# Patient Record
Sex: Female | Born: 1955
Health system: Southern US, Community
[De-identification: ages and names within clinical notes are randomized; demographics above are authoritative.]

## PROBLEM LIST (undated history)

## (undated) DIAGNOSIS — M1990 Unspecified osteoarthritis, unspecified site: Secondary | ICD-10-CM

## (undated) DIAGNOSIS — Z720 Tobacco use: Secondary | ICD-10-CM

## (undated) DIAGNOSIS — E039 Hypothyroidism, unspecified: Secondary | ICD-10-CM

## (undated) DIAGNOSIS — G4733 Obstructive sleep apnea (adult) (pediatric): Secondary | ICD-10-CM

## (undated) DIAGNOSIS — F32A Depression, unspecified: Secondary | ICD-10-CM

## (undated) DIAGNOSIS — M1711 Unilateral primary osteoarthritis, right knee: Secondary | ICD-10-CM

## (undated) DIAGNOSIS — Z87442 Personal history of urinary calculi: Secondary | ICD-10-CM

## (undated) DIAGNOSIS — Z9989 Dependence on other enabling machines and devices: Secondary | ICD-10-CM

## (undated) DIAGNOSIS — I10 Essential (primary) hypertension: Secondary | ICD-10-CM

## (undated) DIAGNOSIS — K760 Fatty (change of) liver, not elsewhere classified: Secondary | ICD-10-CM

## (undated) DIAGNOSIS — E119 Type 2 diabetes mellitus without complications: Secondary | ICD-10-CM

## (undated) DIAGNOSIS — G629 Polyneuropathy, unspecified: Secondary | ICD-10-CM

## (undated) DIAGNOSIS — M1712 Unilateral primary osteoarthritis, left knee: Secondary | ICD-10-CM

## (undated) DIAGNOSIS — F419 Anxiety disorder, unspecified: Secondary | ICD-10-CM

## (undated) DIAGNOSIS — F329 Major depressive disorder, single episode, unspecified: Secondary | ICD-10-CM

## (undated) DIAGNOSIS — I2510 Atherosclerotic heart disease of native coronary artery without angina pectoris: Secondary | ICD-10-CM

## (undated) DIAGNOSIS — Z9289 Personal history of other medical treatment: Secondary | ICD-10-CM

## (undated) HISTORY — DX: Type 2 diabetes mellitus without complications: E11.9

## (undated) HISTORY — DX: Atherosclerotic heart disease of native coronary artery without angina pectoris: I25.10

## (undated) HISTORY — DX: Hypothyroidism, unspecified: E03.9

## (undated) HISTORY — PX: COLONOSCOPY: SHX174

## (undated) HISTORY — PX: CATARACT EXTRACTION: SUR2

## (undated) HISTORY — DX: Dependence on other enabling machines and devices: Z99.89

## (undated) HISTORY — DX: Essential (primary) hypertension: I10

## (undated) HISTORY — DX: Tobacco use: Z72.0

## (undated) HISTORY — DX: Obstructive sleep apnea (adult) (pediatric): G47.33

## (undated) HISTORY — PX: BREAST ENHANCEMENT SURGERY: SHX7

## (undated) HISTORY — PX: AUGMENTATION MAMMAPLASTY: SUR837

## (undated) HISTORY — DX: Personal history of other medical treatment: Z92.89

---

## 1988-02-22 HISTORY — PX: POSTERIOR CERVICAL FUSION/FORAMINOTOMY: SHX5038

## 1998-06-15 ENCOUNTER — Emergency Department (HOSPITAL_COMMUNITY): Admission: EM | Admit: 1998-06-15 | Discharge: 1998-06-15 | Payer: Self-pay | Admitting: Emergency Medicine

## 1998-06-15 ENCOUNTER — Encounter: Payer: Self-pay | Admitting: Emergency Medicine

## 2000-01-14 ENCOUNTER — Emergency Department (HOSPITAL_COMMUNITY): Admission: EM | Admit: 2000-01-14 | Discharge: 2000-01-14 | Payer: Self-pay | Admitting: Emergency Medicine

## 2000-01-14 ENCOUNTER — Encounter: Payer: Self-pay | Admitting: Emergency Medicine

## 2000-02-10 ENCOUNTER — Other Ambulatory Visit: Admission: RE | Admit: 2000-02-10 | Discharge: 2000-02-10 | Payer: Self-pay | Admitting: *Deleted

## 2000-04-18 ENCOUNTER — Encounter: Payer: Self-pay | Admitting: Internal Medicine

## 2000-04-18 ENCOUNTER — Ambulatory Visit (HOSPITAL_COMMUNITY): Admission: RE | Admit: 2000-04-18 | Discharge: 2000-04-18 | Payer: Self-pay | Admitting: Internal Medicine

## 2000-11-10 ENCOUNTER — Other Ambulatory Visit: Admission: RE | Admit: 2000-11-10 | Discharge: 2000-11-10 | Payer: Self-pay | Admitting: Obstetrics and Gynecology

## 2000-12-01 ENCOUNTER — Other Ambulatory Visit: Admission: RE | Admit: 2000-12-01 | Discharge: 2000-12-01 | Payer: Self-pay | Admitting: Obstetrics and Gynecology

## 2002-06-08 ENCOUNTER — Emergency Department (HOSPITAL_COMMUNITY): Admission: EM | Admit: 2002-06-08 | Discharge: 2002-06-08 | Payer: Self-pay | Admitting: Emergency Medicine

## 2002-09-19 ENCOUNTER — Other Ambulatory Visit: Admission: RE | Admit: 2002-09-19 | Discharge: 2002-09-19 | Payer: Self-pay | Admitting: *Deleted

## 2002-09-25 ENCOUNTER — Inpatient Hospital Stay (HOSPITAL_COMMUNITY): Admission: EM | Admit: 2002-09-25 | Discharge: 2002-09-25 | Payer: Self-pay | Admitting: Emergency Medicine

## 2002-09-25 ENCOUNTER — Encounter: Payer: Self-pay | Admitting: Emergency Medicine

## 2002-11-27 ENCOUNTER — Ambulatory Visit (HOSPITAL_COMMUNITY): Admission: RE | Admit: 2002-11-27 | Discharge: 2002-11-27 | Payer: Self-pay | Admitting: Cardiovascular Disease

## 2002-12-11 ENCOUNTER — Other Ambulatory Visit: Admission: RE | Admit: 2002-12-11 | Discharge: 2002-12-11 | Payer: Self-pay | Admitting: *Deleted

## 2003-02-12 ENCOUNTER — Emergency Department (HOSPITAL_COMMUNITY): Admission: EM | Admit: 2003-02-12 | Discharge: 2003-02-12 | Payer: Self-pay | Admitting: Emergency Medicine

## 2004-09-18 ENCOUNTER — Emergency Department (HOSPITAL_COMMUNITY): Admission: EM | Admit: 2004-09-18 | Discharge: 2004-09-18 | Payer: Self-pay | Admitting: Emergency Medicine

## 2004-10-14 ENCOUNTER — Ambulatory Visit (HOSPITAL_COMMUNITY): Admission: RE | Admit: 2004-10-14 | Discharge: 2004-10-14 | Payer: Self-pay | Admitting: Internal Medicine

## 2004-11-24 ENCOUNTER — Ambulatory Visit (HOSPITAL_COMMUNITY): Admission: RE | Admit: 2004-11-24 | Discharge: 2004-11-24 | Payer: Self-pay | Admitting: Internal Medicine

## 2004-11-30 ENCOUNTER — Ambulatory Visit (HOSPITAL_COMMUNITY): Admission: RE | Admit: 2004-11-30 | Discharge: 2004-11-30 | Payer: Self-pay | Admitting: Internal Medicine

## 2004-12-15 ENCOUNTER — Emergency Department (HOSPITAL_COMMUNITY): Admission: EM | Admit: 2004-12-15 | Discharge: 2004-12-15 | Payer: Self-pay | Admitting: Emergency Medicine

## 2005-03-17 ENCOUNTER — Ambulatory Visit (HOSPITAL_COMMUNITY): Admission: RE | Admit: 2005-03-17 | Discharge: 2005-03-17 | Payer: Self-pay | Admitting: Internal Medicine

## 2005-11-21 ENCOUNTER — Ambulatory Visit (HOSPITAL_COMMUNITY): Admission: RE | Admit: 2005-11-21 | Discharge: 2005-11-21 | Payer: Self-pay | Admitting: Internal Medicine

## 2006-02-21 HISTORY — PX: CHOLECYSTECTOMY: SHX55

## 2006-04-06 ENCOUNTER — Ambulatory Visit (HOSPITAL_COMMUNITY): Admission: RE | Admit: 2006-04-06 | Discharge: 2006-04-06 | Payer: Self-pay | Admitting: Internal Medicine

## 2006-04-29 ENCOUNTER — Emergency Department (HOSPITAL_COMMUNITY): Admission: EM | Admit: 2006-04-29 | Discharge: 2006-04-29 | Payer: Self-pay | Admitting: Emergency Medicine

## 2006-07-27 ENCOUNTER — Encounter: Admission: RE | Admit: 2006-07-27 | Discharge: 2006-07-27 | Payer: Self-pay | Admitting: Internal Medicine

## 2006-09-07 ENCOUNTER — Encounter (INDEPENDENT_AMBULATORY_CARE_PROVIDER_SITE_OTHER): Payer: Self-pay | Admitting: Surgery

## 2006-09-07 ENCOUNTER — Inpatient Hospital Stay (HOSPITAL_COMMUNITY): Admission: RE | Admit: 2006-09-07 | Discharge: 2006-09-09 | Payer: Self-pay | Admitting: Surgery

## 2006-11-06 ENCOUNTER — Ambulatory Visit (HOSPITAL_COMMUNITY): Admission: RE | Admit: 2006-11-06 | Discharge: 2006-11-06 | Payer: Self-pay | Admitting: Internal Medicine

## 2008-01-02 ENCOUNTER — Encounter: Admission: RE | Admit: 2008-01-02 | Discharge: 2008-01-02 | Payer: Self-pay | Admitting: Internal Medicine

## 2008-10-17 DIAGNOSIS — E039 Hypothyroidism, unspecified: Secondary | ICD-10-CM | POA: Insufficient documentation

## 2008-10-17 DIAGNOSIS — F329 Major depressive disorder, single episode, unspecified: Secondary | ICD-10-CM | POA: Insufficient documentation

## 2008-10-17 DIAGNOSIS — G629 Polyneuropathy, unspecified: Secondary | ICD-10-CM | POA: Insufficient documentation

## 2009-02-09 ENCOUNTER — Encounter: Admission: RE | Admit: 2009-02-09 | Discharge: 2009-02-09 | Payer: Self-pay | Admitting: Internal Medicine

## 2009-02-23 ENCOUNTER — Encounter: Admission: RE | Admit: 2009-02-23 | Discharge: 2009-02-23 | Payer: Self-pay | Admitting: Internal Medicine

## 2010-03-14 ENCOUNTER — Encounter: Payer: Self-pay | Admitting: Internal Medicine

## 2010-06-25 DIAGNOSIS — J984 Other disorders of lung: Secondary | ICD-10-CM | POA: Insufficient documentation

## 2010-07-06 NOTE — Op Note (Signed)
NAMEMEGHAM, DWYER NO.:  [id number]   MEDICAL RECORD NO.:  [id number]          PATIENT TYPE:  INP   LOCATION:  1536                         FACILITY:  [facility]   PHYSICIAN:  Maisie Fus A. Cornett, M.D.DATE OF BIRTH:  1955-07-20   DATE OF PROCEDURE:  09/07/2006  DATE OF DISCHARGE:                               OPERATIVE REPORT   PREOPERATIVE DIAGNOSIS:  Symptomatic cholelithiasis.   POSTOPERATIVE DIAGNOSIS:  Acute on chronic cholecystitis.   PROCEDURE:  Laparoscopic cholecystectomy with cholangiogram.   SURGEON:  Thomas A. Cornett, M.D.   ASSISTANT:  Gabrielle Dare. Janee Morn, M.D.   ESTIMATED BLOOD LOSS:  50 mL.   SPECIMEN:  Gallbladder and large gallstones to pathology.   DRAINS:  32 Blake draining the gallbladder fossa.   INDICATIONS FOR PROCEDURE:  The patient is a 55 year old female with  biliary colic type symptoms.  She presents today for laparoscopic  cholecystectomy due to persistent biliary colic.  Informed consent was  obtained.  Complications of bleeding, infection, common duct injury,  injury to organs, were all discussed with the patient preoperatively.  She voiced understanding and agreed to proceed.   DESCRIPTION OF PROCEDURE:  The patient brought to the operating room and  placed supine.  After induction of general anesthesia, the abdomen was  prepped and draped in a sterile fashion.  A 2 cm supraumbilical incision  was made after infiltration with 0.25% local anesthesia.  Dissection was  carried down to her fascia.  The fascia was opened with a scalpel and I  placed my finger in the preperitoneal space and pushed through it  without difficulty.  A pursestring suture of 0 Vicryl was placed and a  12 mm port was placed under direct vision.  Pneumoperitoneum was created  with 15 mmHg CO2.  The laparoscope was placed.  The patient was then  placed in reverse Trendelenburg and rolled to her left.  Laparoscopy  revealed no evidence of small bowel, large  bowel or solid organ issue.  There was omentum densely adherent to the gallbladder it looked like.  I  placed an 11 mm subxiphoid port.  I then placed two 5 mm ports under  direct vision in the right upper quadrant.  The omentum was densely  adherent. The gallbladder had signs of acute on chronic cholecystitis it  looked like.  We were able to take the omentum down away from the  gallbladder carefully. This was quite tedious and took some time.  Initially, we were able to expose the infundibulum and grab it and pull  it toward the patient's right lower quadrant.  I mobilize the  gallbladder from its liver attachments using the cautery to help pull  this away to better open the triangle of Calot to define her anatomy.  The cystic artery was visualized.  I took this up on the gallbladder  between clips to better open up the triangle of Calot so I could get  around the cystic duct.  The cystic duct was dilated.  I went ahead and  dissected out the entire cystic duct right at the junction of the  infundibulum since  this was very difficult.  I got a very nice window  behind the cystic duct for about 1 cm, I was able to have a nice cm  window.  I saw no other tubular structures entering the gallbladder and  dissected posterior to the cystic duct to further define her anatomy.  I  then felt a cholangiogram was indicated in this situation.  I placed  clips on the gallbladder.  Unfortunately, the cystic duct was dilated  and the gallbladder was somewhat dilated.  I exchanged my subxiphoid  port for a 12 mm port so I could use a larger clip which I did.  We went  ahead and put clips on the gallbladder side.  There was a small tear in  the cystic duct we created dissecting and I used that to access the  cystic duct and made this a little bit bigger using scissors.  We went  ahead and opened this after clipping the gallbladder side of this.  The  bile seemed to be under significant pressure.  We went  ahead and then  placed a Cook cholangiogram catheter through a separate stab incision  into this.  Unfortunately, we had a very difficult time creating a seal  using the [place] with clips and we tried numerous attempts to seal this  but every time placed saline it leaked from around the clips.  I then  was able to get the catheter fairly far downstream and get enough clips  to hold the catheter in place to get images using fluoroscopy.  The  catheter apparently was up in the common duct when I shot the first of  cholangiograms.  There was contrast that went up what appeared to be the  common hepatic duct and stopped short of the bifurcation.  I could not  get any contrast to go any higher despite increasing the pressure since  it would extravasate when I did or even positioning the patient did not  seem to help.  I was not satisfied with this, so I pulled the catheter  back to just below my clips.  I shot another cholangiogram and got a  similar result but due to increased pressure and extravasation, I could  not get any contrast to go downstream but it went upstream and again  stopped at the bifurcation.  I went ahead and dissected around the stump  that I had and saw no evidence of the common duct or any other ductal  structures that remotely resembled a cystic or common duct.  I did not  see another end to explain that and felt that we did indeed have the  cystic duct since there were no other tubular structures to explain  this.  My other concern was for a common duct stone that was obstructing  right below where we were placing the catheter even though her LFTs were  normal three days ago, this did appear quite dilated.  The images of the  common hepatic duct we got were also dilated which supported this.  I  did not see where I had come across the common duct and could find no  other tubular structures to explain this.  I re-examined the anatomy  again and found that we were on the  infundibulum of the gallbladder and  there was no cystic duct that I could find since I was cutting across  just below the infundibulum as opposed to really being on the cystic  duct.  Given all  this, I felt that a common duct stone was to explain  the cholangiographic findings since I saw no other evidence of any other  tubular structure to explain this and this appeared to be going  downstream which did not fit common duct injury divided it inferior to  the gallbladder.  At this point in time, I did not feel open exploration  was warranted given the appearance and felt a postop ERCP would be  helpful prior to any sort of exploration given the anatomy.  We went  ahead and secured the cystic duct with clips and divided the gallbladder  at the infundibulum.  We then used the hook cautery.  We dissected very  carefully in this area to make sure we saw no other tubular structures  and I did not.  There were some very small vessels that bled when they  were divided and controlled with cautery.  I did not see any leakage of  bile or any structure to explain her anatomy.  We went ahead and used  the hook cautery to dissect the gallbladder out of the gallbladder fossa  without difficulty.  The gallbladder was placed in an EndoCatch bag and  I re-examined the gallbladder bed.  Again, I saw no other tubular  structures or leakage of bile to explain her anatomy and at this point  in time was satisfied with that.  We suctioned all irrigation and I put  a small piece of Surgicel in the gallbladder bed due to some oozing with  an excellent result.  I elected to place a drain in her gallbladder  fossa and did this through the most lateral port using a 19 Blake drain.  This was secured to the skin with 3-0 nylon.  The gallbladder was  extracted through the umbilical port with the EndoCatch bag.  I opened  the gallbladder on the back table and there was no cystic duct at all  with the specimen.  It was  just infundibulum where the clips were across  and there were three large stones in her gallbladder with signs of acute  on chronic inflammation.  I examined the external portion of the  gallbladder as well and saw no evidence of any of other structures.  This was sent to pathology for evaluation.  Irrigation was used and  suctioned out.  The drain was positioned in the subhepatic space.  Hemostasis was excellent.  I closed the subxiphoid port with a 0 Vicryl  using a suture passer as I removed the port.  CO2 was allowed to escape  at this point in time, I saw no evidence of solid organ or hollow organ  injury.  Once the CO2 was allowed to escape, the instruments were passed  off the field.  The  skin was closed with 4-0 Monocryl.  Steri-Strips and dry dressings were  applied.  All final counts of sponge, needle and instruments were found  to be correct at this portion of the case.  The patient was taken to  recovery awakened and in satisfactory condition.  GI will be consulted  postoperatively.      Thomas A. Cornett, M.D.  Electronically Signed     TAC/MEDQ  D:  09/07/2006  T:  09/07/2006  Job:  [id number]   cc:   Geoffry Paradise, M.D.  Fax: [phone]

## 2010-07-06 NOTE — Op Note (Signed)
[name], [name]            ACCOUNT NO.:  [id number]   MEDICAL RECORD NO.:  [id number]          PATIENT TYPE:  INP   LOCATION:  1536                         FACILITY:  [facility]   PHYSICIAN:  John C. Madilyn Fireman, M.D.    DATE OF BIRTH:  22-Mar-1955   DATE OF PROCEDURE:  09/08/2006  DATE OF DISCHARGE:                               OPERATIVE REPORT   PROCEDURE PERFORMED:  Endoscopic retrograde cholangiopancreatography.   INDICATIONS FOR PROCEDURE:  Abnormal intraoperative cholangiogram with  incomplete visualization of the distal common bile duct and some  extravasation raising the question of possible bile leak or stapling of  part of  part of the common bile duct.   PROCEDURE:  The patient was placed in the prone position and placed on  the pulse monitor with continuous low flow oxygen delivered by nasal  cannula.  She was sedated with 100 mcg IV fentanyl and 9 mg IV Versed.  The Olympus video side viewing endoscope was advanced blindly into the  oropharynx, esophagus and stomach.  The pylorus was traversed and the  papilla of Vater located on the medial duodenal wall, it had a normal  appearance and there was clear amber bile coming from it spontaneously.  Initial attempt at cannulation achieved only shallow cannulation of the  ampulla.  Initial injections of dye passed by the pancreatic duct which  appeared normal. With the aid of the guidewire, the common bile duct was  selectively cannulated and the guidewire passed into the duct.  The  common duct appeared to be of normal caliber throughout its length with  no strictures, dilatation, or filling defects.  No extravasation outside  the biliary tree was known was seen. Several cholangiogram spot pictures  were taken.  The scope was then withdrawn and the patient returned to  the recovery room in stable condition.  She tolerated the procedure  well.  There were no immediate complications.   IMPRESSION:  Normal common bile duct with no  extravasation or filling  defects seen.   PLAN:  Advance diet and routine postop care per general surgery.           ______________________________  [name], M.D.     JCH/MEDQ  D:  09/08/2006  T:  09/09/2006  Job:  [id number]   cc:   Thomas A. Cornett, M.D.  [address]  [place] [address]

## 2010-07-06 NOTE — Consult Note (Signed)
[name], [name]            ACCOUNT NO.:  [id number]   MEDICAL RECORD NO.:  [id number]          PATIENT TYPE:  INP   LOCATION:  1536                         FACILITY:  [facility]   PHYSICIAN:  John C. Madilyn Fireman, M.D.    DATE OF BIRTH:  Nov 02, 1955   DATE OF CONSULTATION:  09/07/2006  DATE OF DISCHARGE:                                 CONSULTATION   REASON FOR CONSULTATION:  We were asked to see [name] today in  consultation for abnormal IOC during a laparoscopic cholecystectomy by  Dr. Harriette Bouillon.   HISTORY OF PRESENT ILLNESS:  This is 55 year old female who tells me  that she has been experiencing nausea and vomiting for several months.  Recently this nausea and sudden vomiting has become more frequent.  She  denies any abdominal pain, jaundiced, weight loss, any change in bowel  habits.  She denies hematemesis.  It is also noteworthy that a rise in  LFTs has never been noted in her medical record.  She is status post  laparoscopic cholecystectomy today by Dr. Luisa Hart.  Her IOC was abnormal  in that the dye did not pass into the duodenum.  Dr. Luisa Hart notes that  there was increased pressure behind the cystic duct, and the dye  appeared to travel straight up.  Abdominal ultrasound was done June,  2008; was positive for gallstones but no evidence of cholecystitis;  positive for fatty liver; there was no ductal dilatation noted.   PAST MEDICAL HISTORY:  Significant for:  1. Respiratory issues including obstructive sleep apnea; she uses a      CPAP at night.  2. Hypertension.  3. Obesity.  4. She is an insulin-dependent diabetic.  5. Hypothyroidism.  6. Depression.   SURGICAL HISTORY:  Includes:  1. Cervical foraminotomy in 1989.  2. Bilateral mammoplasty.  3. Cardiac cath in 2004 which was normal.  4. Laparoscopic cholecystectomy September 07, 2006.   PRIMARY CARE Jessica Checketts:  Geoffry Paradise, MD.   CURRENT MEDICATIONS:  Include insulin, Glucophage, Diovan/HCTZ, Lexapro  and Synthroid.   ALLERGIES:  She has no known drug allergies.   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  She is positive for  smoking a pack of cigarettes a day for the past 15 years.  She denies  any alcohol or drug use.   FAMILY HISTORY:  Negative for gallbladder disease, pancreatic or liver  disease.   PHYSICAL EXAM:  GENERAL:  She is resting comfortably immediately post  surgery.  Her CPAP is in place.  Her oxygen sat is currently 91%.  LUNGS:  Regular rate and rhythm.  LUNGS:  Clear to auscultation anteriorly.  ABDOMEN:  Obese, soft, quiet and nontender.   LABS:  Labs from September 05, 2006:  LFTs show AST 18, ALT 19, alk phos 114,  total bilirubin 0.4, hemoglobin 15.1, hematocrit 42.4, white count 13.4,  platelets 277,000.  Chem-7 was within normal limits with the exception  of a glucose of 272.   ASSESSMENT:  The patient has been experiencing increasing nausea and  vomiting without pain or other evidence of illness.  She is status post  cholecystectomy today with an abnormal IOC.   PLAN:  We will present the patient to Dr. Madilyn Fireman for probable ERCP versus  MRCP tomorrow afternoon, September 08, 2006.  I have met with the patient and  her family and discussed ERCP ERCP versus MRCP.  The ERCP process and  risks of pancreatitis, infection, sedation, bleeding or perforation were  discussed and understood by the family.  They agree to proceed with  doctor recommendations.   Thanks very much for this consultation.      Stephani Police, PA    ______________________________  [name], M.D.    MLY/MEDQ  D:  09/07/2006  T:  09/08/2006  Job:  [id number]   cc:   Maisie Fus A. Cornett, M.D.  [address]  [place] [address]   Everardo All. Madilyn Fireman, M.D.  Fax: [phone]   Geoffry Paradise, M.D.  Fax: [phone]

## 2010-07-09 NOTE — H&P (Signed)
NAME:  [name], WITTMEYER                      ACCOUNT NO.:  [id number]   MEDICAL RECORD NO.:  [id number]                   PATIENT TYPE:  INP   LOCATION:                                       FACILITY:  [facility]   PHYSICIAN:  Barry Dienes. Eloise Harman, M.D.            DATE OF BIRTH:  08-15-1955   DATE OF ADMISSION:  DATE OF DISCHARGE:                                HISTORY & PHYSICAL   CHIEF COMPLAINT:  Chest pain.   HISTORY OF PRESENT ILLNESS:  The patient is a 55 year old white female with  a history of tobacco abuse, who noted moderately severe substernal chest  pressure radiating to the back, left shoulder, and left arm.  The pain was  associated with shortness of breath.  It started at rest at approximately 11  o'clock p.m. and lasted until she sought emergency room treatment (25  minutes).  After initial emergency room treatment including nasal cannula  oxygen and IV heparin, she felt fine with no chest pain at all.  She has no  history of coronary artery disease.  She was recently felt by a  pulmonologist to have symptoms of gastroesophageal reflux disease.  A proton  pump inhibitor was prescribed but had not yet been started.   PAST MEDICAL HISTORY:  Hypothyroidism, hypertension, depression, tobacco  use, gastroesophageal reflux disease, morbid obesity.   MEDICATIONS:  1. Diovan HCT 80/12.5 mg one tab p.o. daily.  2. Lexapro 10 mg p.o. daily.  3. Synthroid 100 mcg p.o. daily.   ALLERGIES:  No known drug allergies.   PAST SURGICAL HISTORY:  C spine operation, 1989; 1993, augmentation  mammoplasty.   FAMILY HISTORY:  Mother is age 45 and has hypertension and has had a stroke.  Father died at age 93 of complications of COPD and diabetes mellitus.  She  has two sisters who have psychiatric conditions.   SOCIAL HISTORY:  She is married.  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  She has no  history of alcohol  abuse.   REVIEW OF SYSTEMS:  Significant for chronic mild bilateral hip and ankle  pain and mild depression and chronic mild insomnia.  She denies headache,  vision change, fever, current shortness of breath, nausea, constipation,  diarrhea, dysuria, or anxiety.   PHYSICAL EXAMINATION:  VITAL SIGNS:  Blood pressure 156/86, pulse 90,  respiratory rate 20, temperature 97.5, oxygen saturation 96% on room air.  GENERAL:  In general, she is an overweight white female who is in no  apparent distress.  HEENT:  Exam was within normal limits.  NECK:  Neck was supple with no jugular venous distention or carotid bruit.  CHEST:  Chest was clear to auscultation.  HEART:  Heart had a regular rate and rhythm.  S1 and S2 were present without  murmur, gallop, or rub.  ABDOMEN:  Abdomen had  normal bowel sounds with no hepatosplenomegaly or  tenderness.  EXTREMITIES:  Extremities were without cyanosis, clubbing, or edema.  NEUROLOGICAL:  Exam was nonfocal.   LABORATORY STUDIES:  EKG showed:  #1 - normal sinus rhythm; #2 - left atrial  enlargement; #3 - nonspecific ST segment abnormalities.   Chest x-ray showed no acute cardiopulmonary disease.   White blood cell count was 13.9, hemoglobin 14, hematocrit 41, platelet  count 238,000.  Serum sodium 137, potassium 3.4, chloride 107, CO2 22, BUN  12, creatinine 0.6, glucose 201.  CK 62, troponin I 0.01.   IMPRESSION AND PLAN:  1. Chest pain:  Her symptoms are suggestive of new angina versus     gastroesophageal reflux disease.  I doubt that her symptoms are due to a     panic attack.  I plan on continuing intravenous heparin with aspirin and     adding beta blocker treatment.  She will have serial cardiac isoenzymes     drawn to check for myocardial injury.  A cardiology consultant will be     asked to evaluate her case for risk stratification.  2. Hypertension:  She is under fair control on her current regimen.     Lopressor will be added for angina  projection and optimization of her     blood pressure control.  3. Hyperglycemia:  This is suggestive of a new diagnosis of diabetes     mellitus, type 2, which she is at risk for due to her weight and her     positive family history.  I will check a hemoglobin A1c level and a     repeat fasting capillary blood glucose level.  If her fasting blood sugar     levels are normal, an outpatient glucose tolerance test could be     performed.  4. Depression:  She has persistent symptoms on relatively low-dose Lexapro.     Lexapro will be increased to 20 mg daily.                                               Barry Dienes Eloise Harman, M.D.    DGP/MEDQ  D:  09/25/2002  T:  09/25/2002  Job:  [id number]   cc:   Geoffry Paradise, M.D.  [address]  [place]  [place] [address]  Fax: [phone]   Charlaine Dalton. Sherene Sires, M.D. [name]

## 2010-07-09 NOTE — Discharge Summary (Signed)
[name], [name] NO.:  [id number]   MEDICAL RECORD NO.:  [id number]          PATIENT TYPE:  INP   LOCATION:  1536                         FACILITY:  [facility]   PHYSICIAN:  Maisie Fus A. Cornett, M.D.DATE OF BIRTH:  06/22/1955   DATE OF ADMISSION:  09/07/2006  DATE OF DISCHARGE:  09/09/2006                               DISCHARGE SUMMARY   ADMITTING DIAGNOSIS:  Symptomatic cholelithiasis.   DISCHARGE DIAGNOSIS:  Symptomatic cholelithiasis.   PROCEDURE PERFORMED:  1. Laparoscopic cholecystectomy with cholangiogram.  2. ERCP by Dr. Madilyn Fireman of [facility].   BRIEF HISTORY:  The patient is a 55 year old female admitted on September 07, 2006, for laparoscopic cholecystectomy.  Intraoperative cholangiogram  revealed an abnormality and GI was consulted postoperatively.  LFTs were  normal.  ERCP revealed no evidence of common duct stone.  Cholangiogram  revealed incomplete filling and obstruction which more than likely  represented the cystic duct.  She was discharged home on postoperative  day #2 in satisfactory condition after uneventful postoperative course  with a negative ERCP.   DISCHARGE INSTRUCTIONS:  She will follow up in one to two weeks.  She  will be given Vicodin for pain and resume her preoperative medications  as before as indicated in the [facility].  She will refrain from heavy lifting  and driving for 1 week, and resume activities at that point as  tolerated.  Diet will be as before.   CONDITION AT DISCHARGE:  Improved.      Thomas A. Cornett, M.D.  Electronically Signed     TAC/MEDQ  D:  09/27/2006  T:  09/27/2006  Job:  [id number]

## 2010-07-09 NOTE — H&P (Signed)
NAME:  [name], HANDYSIDE NO.:  [id number]   MEDICAL RECORD NO.:  [id number]                   PATIENT TYPE:  OIB   LOCATION:                                       FACILITY:  [facility]   PHYSICIAN:  Vesta Mixer, M.D.              DATE OF BIRTH:  07/09/1955   DATE OF ADMISSION:  11/27/2002  DATE OF DISCHARGE:                                HISTORY & PHYSICAL   [name] is a 55 year old female with a history of chest pains and shortness  of breath.  She is admitted for heart catheterization after having an  abnormal stress Cardiolite study.   [name] is a middle-aged female with a history of chest pain and shortness  of breath for quite some time.  She also has a history of cigarette smoking.  Recently she has been having some episodes of chest pain and shortness of  breath that occur with minimal exertion.  These episodes are somewhat  atypical and not necessarily associated with eating, drinking, change in  position, taking a deep breath.  She was hospitalized a month or so ago with  these problems and ruled out for myocardial infarction.  She also has a  history of  hypertension and hypothyroidism and has also had a history of  depression.  She recently had a stress Cardiolite study, in our office, and  was found to have a reversible anterior wall defect.  She is now admitted  for heart catheterization for further evaluation.   CURRENT MEDICATIONS:  1. Diovan/HCT 80 mg/12.5 mg once a day.  2. Lexapro 10 mg a day.  3. Synthroid 0.1 mg a day.   ALLERGIES:  She has no known drug allergies.   PAST MEDICAL HISTORY:  1. Hypertension.  2. Hypothyroidism.  3. Depression.   SOCIAL HISTORY:  The patient works as a Buyer, retail at [employer].  She smokes one pack of cigarettes a day for the past 15 years.  She does not drink any alcohol.   FAMILY HISTORY:  Her father died at age 50 due to complications from  coronary artery bypass  grafting.  Her mother is 35 years old and is still  alive.  Her mother has a history of hypertension and a stroke as well as  diabetes mellitus.   REVIEW OF SYSTEMS:  Reviewed.  She denies any problems with her eyes, ears,  nose and throat.  She does wear glasses.  She denies any fever or chills or  weight gain or weight loss.  She denies any problems with her pulmonary  system.  She denies any GI troubles or GU problems.  She denies any rash or  skin nodules.  Her review of systems was reviewed and is otherwise negative.   PHYSICAL EXAMINATION:  GENERAL:  She is a middle-aged female in no acute  distress.  She is alert and oriented  x 3 and her  mood and affect are  normal.  VITAL SIGNS:  Her weight is 175.  Her blood pressure is 130/50 with a heart  rate of 80.  HEENT:  Reveals 2+ carotids.  She has no bruits.  There is no JVD, no  thyromegaly.  LUNGS:  Clear to auscultation.  HEART:  Regular rate, S1, S2.  She has no murmurs, gallops or rubs.  ABDOMEN:  Reveals good bowel sounds and is nontender.  EXTREMITIES:  She has no clubbing, cyanosis or edema.  NEUROLOGIC:  Nonfocal.   Overall, [name] presents with episodes of chest pain.  Her stress  Cardiolite study reveals a reversible defect in her anterior wall.  It is  possible that this is due to a breast artifact, although the defect  certainly appears to be reversible.  Given her symptoms, I would like to  proceed with heart catheterization.  We have discussed the risks, benefits  and options of heart catheterization.  She understands and agrees to  proceed.                                                Vesta Mixer, M.D.    PJN/MEDQ  D:  11/19/2002  T:  11/19/2002  Job:  [id number]   cc:   Brunilda Payor, Dr.

## 2010-07-09 NOTE — Consult Note (Signed)
NAME:  CAROLENA, FAIRBANK NO.:  [id number]   MEDICAL RECORD NO.:  [id number]                   PATIENT TYPE:  INP   LOCATION:  0353                                 FACILITY:  [facility]   PHYSICIAN:  Vesta Mixer, M.D.              DATE OF BIRTH:  [birth year]   DATE OF CONSULTATION:  09/25/2002  DATE OF DISCHARGE:                                   CONSULTATION   HISTORY OF PRESENT ILLNESS:  Ms. Bomkamp is a 55 year old female with a  history of hypertension, depression, and hypothyroidism.  She is admitted  with an episode of chest pain.  We were asked to see her for further  evaluation of this episode of chest pain.   The patient has been in her usual state of health until last night when she  developed substernal chest pain.  The chest pain was very sharp and was in  the middle of her chest and radiated through to the back of her chest.  It  occurred at rest and she really was not doing anything in particular.  It  lasted approximately 25 minutes and resolved spontaneously while she was on  her way to the hospital.  It seemed to radiate down her arm and also caused  some tingling in her left hand and fingers.  The patient does not get any  regular exercise, but has not had any episodes of chest pain, shortness of  breath, syncope, or presyncope while doing any activities such as shopping  or carrying in the groceries.  She is also able to work a fairly busy  schedule without having any episodes of angina.   CURRENT MEDICATIONS:  1. Enteric-coated aspirin 325 mg daily.  2. Lopressor 25 mg b.i.d.  3. Diovan/HCT 80/12.5 mg daily.  4. Lexapro 20 mg daily.  5. Synthroid 0.1 mg daily.  6. Protonix once a day.   ALLERGIES:  She has no known drug allergies.   PAST MEDICAL HISTORY:  1. Hypertension.  2. History of depression.  3. Hyperthyroidism.   SOCIAL HISTORY:  The patient smokes one pack of cigarettes a day.   FAMILY HISTORY:  Noncontributory.   Positive for coronary artery disease.   PHYSICAL EXAMINATION:  GENERAL:  She is a middle aged female in no acute  distress.  She is currently pain-free.  VITAL SIGNS:  Temperature 98.4, heart rate 70, blood pressure 116/68.  NECK: 2+ carotids.  She has no bruits.  There is no JVD, no thyromegaly.  LUNGS:  Clear to auscultation.  HEART:  Regular rate.  S1, S2 with no murmur, rub, or gallop.  ABDOMEN:  Good bowel sounds, nontender.  EXTREMITIES:  She has no clubbing, cyanosis, edema.  NEUROLOGIC:  Nonfocal.   LABORATORY DATA:  White blood cell count 13.9 with a hemoglobin of 14.2.  Sodium 137, potassium 3.4, chloride 107, CO2 26, BUN 12, creatinine 0.8,  glucose 201.  Initial CPK  62 with 1.3 MB with a second CPK of 59 with 1.2  MB.  Her troponin is negative x3.  EKG reveals normal sinus rhythm.  She has  no ST or T-wave changes.   IMPRESSION:  Ms. Alesi presents with an episode of chest pain.  This chest  pain was fairly atypical and so far she has two negative sets of enzymes.  She has no EKG changes and at this point I think she is stable enough to be  discharged from a cardiac standpoint.  She still has an elevated white blood  cell count of unclear etiology.   Assuming a third set of cardiac enzymes are normal, I think that it would be  okay for her to go home either later tonight or perhaps tomorrow morning.  We will see her as an outpatient to do a two day stress Cardiolite study.  I  have advised her to stop smoking as this will greatly reduce her risk of  developing coronary artery disease.  All of her other medical problems  remain stable.                                               Vesta Mixer, M.D.    PJN/MEDQ  D:  09/25/2002  T:  09/25/2002  Job:  [id number]   cc:   Geoffry Paradise, M.D.  [address]  [place]  [place] [address]  Fax: [phone]

## 2010-07-09 NOTE — Cardiovascular Report (Signed)
   NAME:  [name], [name] NO.:  [id number]   MEDICAL RECORD NO.:  [id number]                   PATIENT TYPE:  OIB   LOCATION:  2852                                 FACILITY:  [facility]   PHYSICIAN:  Vesta Mixer, M.D.              DATE OF BIRTH:  20-Dec-1955   DATE OF PROCEDURE:  11/27/2002  DATE OF DISCHARGE:                              CARDIAC CATHETERIZATION   [name] is a 55 year old female with a history of cigarette smoking and  shortness of breath and some chest pain.  She recently had a stress  Cardiolite study which revealed anterior apical ischemia.  She is referred  for heart catheterization for further evaluation.   PROCEDURE:  Left heart catheterization with coronary angiography.   The right femoral artery was easily cannulated using the assistance of a  Smart needle.   HEMODYNAMICS:  The left ventricular pressure was 123/19 with an aortic  pressure of 123/75.   CORONARY ANGIOGRAPHY:  1. Left main coronary artery was relatively smooth and normal.  2. The left anterior descending artery is a moderate to large vessel.  It is     relatively smooth and normal throughout its course.  It gives off a     moderate size diagonal branch which is normal.  3. The left circumflex artery is a moderate size vessel.  It gives off a     very high obtuse marginal artery which also could be considered a ramus     intermediate branch.  It gives off a very large marginal branch and then     a large posterior lateral branch.  All of these branches are normal.  4. The right coronary artery is large and is dominant.  5. The posterior descending artery is large and normal.  The posterior     lateral branch is fairly small because of the presence of a large     circumflex artery.  All of these branches are normal.   LEFT VENTRICULOGRAM:  Left ventriculogram was performed was a 30 RAO  position.  It reveals normal left ventricular systolic function with  an  ejection fraction of around 65%.  There is no significant mitral  regurgitation.   COMPLICATIONS:  None.    CONCLUSIONS:  1. Smooth and normal coronary arteries.  2. Normal left ventricular systolic function.                                                 Vesta Mixer, M.D.    PJN/MEDQ  D:  11/27/2002  T:  11/27/2002  Job:  [id number]   cc:   Geoffry Paradise, M.D.  [address]  [place]  [place] [address]  Fax: [phone]

## 2010-12-06 LAB — DIFFERENTIAL
Basophils Relative: 1
Eosinophils Absolute: 0.2
Lymphs Abs: 4.1 — ABNORMAL HIGH
Neutrophils Relative %: 63

## 2010-12-06 LAB — COMPREHENSIVE METABOLIC PANEL
ALT: 19
Alkaline Phosphatase: 114
BUN: 8
CO2: 25
Calcium: 9.7
GFR calc non Af Amer: >60
Glucose, Bld: 222 — ABNORMAL HIGH
Sodium: 134 — ABNORMAL LOW

## 2010-12-06 LAB — TYPE AND SCREEN

## 2010-12-06 LAB — URINALYSIS, ROUTINE W REFLEX MICROSCOPIC
Ketones, ur: NEGATIVE
Nitrite: NEGATIVE
Protein, ur: NEGATIVE

## 2010-12-06 LAB — HEPATIC FUNCTION PANEL
ALT: 30
AST: 25
AST: 30
Albumin: 3.1 — ABNORMAL LOW
Albumin: 3.5
Alkaline Phosphatase: 82
Bilirubin, Direct: 0.1
Bilirubin, Direct: <0.1
Total Bilirubin: 0.8

## 2010-12-06 LAB — CBC
HCT: 35.8 — ABNORMAL LOW
HCT: 42.4
Hemoglobin: 12.4
Hemoglobin: 15.1 — ABNORMAL HIGH
MCHC: 35.6
MCV: 89
Platelets: 262
RBC: 4.02
RBC: 4.76
RDW: 13.2
WBC: 8.3

## 2010-12-06 LAB — BASIC METABOLIC PANEL
BUN: 6
Calcium: 9.5
GFR calc non Af Amer: >60
Glucose, Bld: 205 — ABNORMAL HIGH

## 2010-12-06 LAB — PREGNANCY, URINE: Preg Test, Ur: NEGATIVE

## 2010-12-06 LAB — ABO/RH: ABO/RH(D): O NEG

## 2011-01-06 DIAGNOSIS — E1142 Type 2 diabetes mellitus with diabetic polyneuropathy: Secondary | ICD-10-CM | POA: Insufficient documentation

## 2011-01-06 DIAGNOSIS — E1165 Type 2 diabetes mellitus with hyperglycemia: Secondary | ICD-10-CM | POA: Insufficient documentation

## 2011-12-16 DIAGNOSIS — Z9289 Personal history of other medical treatment: Secondary | ICD-10-CM

## 2011-12-16 HISTORY — DX: Personal history of other medical treatment: Z92.89

## 2011-12-30 ENCOUNTER — Ambulatory Visit
Admission: RE | Admit: 2011-12-30 | Discharge: 2011-12-30 | Disposition: A | Discharge status: Home / Self Care | Payer: BC Managed Care – PPO | Source: Ambulatory Visit | Attending: Cardiovascular Disease | Admitting: Cardiovascular Disease

## 2011-12-30 ENCOUNTER — Other Ambulatory Visit: Payer: Self-pay | Admitting: Cardiovascular Disease

## 2011-12-30 DIAGNOSIS — R0602 Shortness of breath: Secondary | ICD-10-CM

## 2012-01-03 ENCOUNTER — Encounter (HOSPITAL_COMMUNITY): Payer: Self-pay | Admitting: Pharmacy Technician

## 2012-01-04 ENCOUNTER — Ambulatory Visit (HOSPITAL_COMMUNITY)
Admission: RE | Admit: 2012-01-04 | Discharge: 2012-01-04 | Disposition: A | Discharge status: Home / Self Care | Payer: BC Managed Care – PPO | Source: Ambulatory Visit | Attending: Cardiovascular Disease | Admitting: Cardiovascular Disease

## 2012-01-04 ENCOUNTER — Encounter (HOSPITAL_COMMUNITY)
Admission: RE | Disposition: A | Discharge status: Home / Self Care | Payer: Self-pay | Source: Ambulatory Visit | Attending: Cardiovascular Disease

## 2012-01-04 DIAGNOSIS — R079 Chest pain, unspecified: Secondary | ICD-10-CM | POA: Insufficient documentation

## 2012-01-04 DIAGNOSIS — I2510 Atherosclerotic heart disease of native coronary artery without angina pectoris: Secondary | ICD-10-CM | POA: Insufficient documentation

## 2012-01-04 DIAGNOSIS — F17200 Nicotine dependence, unspecified, uncomplicated: Secondary | ICD-10-CM | POA: Insufficient documentation

## 2012-01-04 DIAGNOSIS — Z9641 Presence of insulin pump (external) (internal): Secondary | ICD-10-CM | POA: Insufficient documentation

## 2012-01-04 DIAGNOSIS — G4733 Obstructive sleep apnea (adult) (pediatric): Secondary | ICD-10-CM | POA: Insufficient documentation

## 2012-01-04 DIAGNOSIS — E119 Type 2 diabetes mellitus without complications: Secondary | ICD-10-CM | POA: Insufficient documentation

## 2012-01-04 DIAGNOSIS — I10 Essential (primary) hypertension: Secondary | ICD-10-CM | POA: Insufficient documentation

## 2012-01-04 DIAGNOSIS — E663 Overweight: Secondary | ICD-10-CM | POA: Insufficient documentation

## 2012-01-04 HISTORY — PX: LEFT HEART CATHETERIZATION WITH CORONARY ANGIOGRAM: SHX5451

## 2012-01-04 HISTORY — PX: CARDIAC CATHETERIZATION: SHX172

## 2012-01-04 LAB — GLUCOSE, CAPILLARY: Glucose-Capillary: 168 mg/dL — ABNORMAL HIGH (ref 70–99)

## 2012-01-04 SURGERY — LEFT HEART CATHETERIZATION WITH CORONARY ANGIOGRAM
Anesthesia: LOCAL

## 2012-01-04 MED ORDER — SODIUM CHLORIDE 0.9 % IJ SOLN: 3.0000 mL | INTRAMUSCULAR | Status: DC | PRN

## 2012-01-04 MED ORDER — GABAPENTIN 100 MG PO CAPS: 100.0000 mg | ORAL_CAPSULE | Freq: Three times a day (TID) | ORAL | Status: DC

## 2012-01-04 MED ORDER — VERAPAMIL HCL 2.5 MG/ML IV SOLN
INTRAVENOUS | Status: AC
  Filled 2012-01-04: qty 2

## 2012-01-04 MED ORDER — MIDAZOLAM HCL 2 MG/2ML IJ SOLN
INTRAMUSCULAR | Status: AC
  Filled 2012-01-04: qty 2

## 2012-01-04 MED ORDER — FENTANYL CITRATE 0.05 MG/ML IJ SOLN
INTRAMUSCULAR | Status: AC
  Filled 2012-01-04: qty 2

## 2012-01-04 MED ORDER — NITROGLYCERIN 0.2 MG/ML ON CALL CATH LAB
INTRAVENOUS | Status: AC
  Filled 2012-01-04: qty 1

## 2012-01-04 MED ORDER — INSULIN ASPART 100 UNIT/ML ~~LOC~~ SOLN: 0.0000 [IU] | SUBCUTANEOUS | Status: DC

## 2012-01-04 MED ORDER — ACETAMINOPHEN 325 MG PO TABS: 650.0000 mg | ORAL_TABLET | ORAL | Status: DC | PRN

## 2012-01-04 MED ORDER — SODIUM CHLORIDE 0.9 % IV SOLN
INTRAVENOUS | Status: DC
  Administered 2012-01-04: 12:00:00 via INTRAVENOUS

## 2012-01-04 MED ORDER — ESCITALOPRAM OXALATE 20 MG PO TABS: 40.0000 mg | ORAL_TABLET | Freq: Every day | ORAL | Status: DC

## 2012-01-04 MED ORDER — LIDOCAINE HCL (PF) 1 % IJ SOLN
INTRAMUSCULAR | Status: AC
  Filled 2012-01-04: qty 30

## 2012-01-04 MED ORDER — SODIUM CHLORIDE 0.9 % IV SOLN: INTRAVENOUS | Status: DC

## 2012-01-04 MED ORDER — DIAZEPAM 5 MG PO TABS
ORAL_TABLET | ORAL | Status: AC
  Administered 2012-01-04: 5 mg
  Filled 2012-01-04: qty 1

## 2012-01-04 MED ORDER — ASPIRIN 81 MG PO CHEW
CHEWABLE_TABLET | ORAL | Status: AC
  Filled 2012-01-04: qty 4

## 2012-01-04 MED ORDER — ONDANSETRON HCL 4 MG/2ML IJ SOLN: 4.0000 mg | Freq: Four times a day (QID) | INTRAMUSCULAR | Status: DC | PRN

## 2012-01-04 MED ORDER — LEVOTHYROXINE SODIUM 100 MCG PO TABS: 100.0000 ug | ORAL_TABLET | Freq: Every day | ORAL | Status: DC

## 2012-01-04 MED ORDER — DIAZEPAM 5 MG PO TABS: 5.0000 mg | ORAL_TABLET | ORAL | Status: DC

## 2012-01-04 MED ORDER — HEPARIN (PORCINE) IN NACL 2-0.9 UNIT/ML-% IJ SOLN
INTRAMUSCULAR | Status: AC
  Filled 2012-01-04: qty 1000

## 2012-01-04 NOTE — H&P (Signed)
  H & P will be scanned in.  Pt was reexamined and existing H & P reviewed. No changes found.  Runell Gess, MD [facility] 01/04/2012 1:56 PM

## 2012-01-04 NOTE — Op Note (Signed)
Adianna Darwin is a 56 y.o. female    [id number] LOCATION:  FACILITY: [facility]  PHYSICIAN: Nanetta Batty, M.D. 1955-12-20   DATE OF PROCEDURE:  01/04/2012  DATE OF DISCHARGE:  [facility]  CARDIAC CATHETERIZATION     History obtained from.chart:Ms. Cranor is a 56 year old moderately overweight married Caucasian female with [facility] children[facility]. She was referred by Dr. Jacky Kindle for evaluation of recent onset chest pain. Her cardiac risk factor profile is positive for 25-pack-year history tobacco abuse currently smoking one pack per day but type 2 diabetes on insulin pump, and hypertension. She is obstructive sleep apnea on CPAP. She hasn't nuclear stress test that showed normal him images with 2 mm of ST segment depression which persisted into recovery. Because of this she presents now for diagnostic coronary arteriography as an outpatient via the right radial approach to define her anatomy and rule out an ischemic etiology.   PROCEDURE DESCRIPTION:    The patient was brought to the second floor  [facility] [facility] cath lab in the postabsorptive state. She was premedicated with Valium 5 mg by mouth. Her right wrist was prepped and shaved in usual sterile fashion. Xylocaine 1% was used for local anesthesia. A 5 French sheath was inserted into the right radial artery using standard Seldinger technique. The patient received 5000 units  of   intravenously.  A 5 [place] TIG catheter along with a 5 French pigtail catheter were used for selective coronary angiography and left ventriculography respectively. Visipaque dye was used for the entirety of the case. Retrograde aorta, left ventricular and pullback pressures were recorded.    HEMODYNAMICS:    AO SYSTOLIC/AO DIASTOLIC: 119/67   LV SYSTOLIC/LV DIASTOLIC: 124/21  ANGIOGRAPHIC RESULTS:   1. Left main; normal  2. LAD; essentially normal with minor irregularities 3. Left circumflex; there was a  ramus intermedius branch that was moderate in size and normal. OM1 was a small vessel that was subtotally occluded with a 99% stenosis in the proximal third with TIMI 2 flow. This was at most a 1.26mm vessel.  4. Right coronary artery; dominant with a 75% stenosis in the PDA two thirds down the vessel. 5. Left ventriculography; RAO left ventriculogram was performed using  25 mL of Visipaque dye at 12 mL/second. The overall LVEF estimated  60 % Without wall motion abnormalities  IMPRESSION:Ms. Cinnamon has two-vessel disease with a moderate severity stenosis in the distal PDA is subtotally occluded small OM 2. I believe at this point medical therapy would be the optimal treatment reserving percutaneous intervention for recalcitrant symptoms. The sheath was removed and a TR Band was placed on the right wrist to achieve patent hemostasis. The patient left the Cath Lab in stable condition. She'll be gently hydrated for 2 hours and discharged home from short stay. I will see her back in the office in approximately one to 2 weeks for followup. Dr. Jacky Kindle was notified of these results.  Runell Gess MD, [facility] 01/04/2012 2:42 PM

## 2012-11-02 ENCOUNTER — Encounter: Payer: Self-pay | Admitting: *Deleted

## 2012-11-05 ENCOUNTER — Ambulatory Visit (INDEPENDENT_AMBULATORY_CARE_PROVIDER_SITE_OTHER): Payer: BC Managed Care – PPO | Admitting: Physician Assistant

## 2012-11-05 ENCOUNTER — Encounter: Payer: Self-pay | Admitting: Physician Assistant

## 2012-11-05 VITALS — BP 126/80 | HR 73 | Ht 67.0 in | Wt 237.0 lb

## 2012-11-05 DIAGNOSIS — Z720 Tobacco use: Secondary | ICD-10-CM

## 2012-11-05 DIAGNOSIS — I10 Essential (primary) hypertension: Secondary | ICD-10-CM | POA: Insufficient documentation

## 2012-11-05 DIAGNOSIS — E119 Type 2 diabetes mellitus without complications: Secondary | ICD-10-CM

## 2012-11-05 DIAGNOSIS — E1110 Type 2 diabetes mellitus with ketoacidosis without coma: Secondary | ICD-10-CM | POA: Insufficient documentation

## 2012-11-05 DIAGNOSIS — I2510 Atherosclerotic heart disease of native coronary artery without angina pectoris: Secondary | ICD-10-CM

## 2012-11-05 DIAGNOSIS — G4733 Obstructive sleep apnea (adult) (pediatric): Secondary | ICD-10-CM | POA: Insufficient documentation

## 2012-11-05 DIAGNOSIS — E669 Obesity, unspecified: Secondary | ICD-10-CM | POA: Insufficient documentation

## 2012-11-05 DIAGNOSIS — F17200 Nicotine dependence, unspecified, uncomplicated: Secondary | ICD-10-CM

## 2012-11-05 MED ORDER — ISOSORBIDE MONONITRATE ER 30 MG PO TB24: 30.0000 mg | ORAL_TABLET | Freq: Every day | ORAL | Status: DC

## 2012-11-05 NOTE — Patient Instructions (Addendum)
Start Imdur 30mg  daily.  Pay attention to the frequency of pain.  If it continues,  Call for an appt.  Have a [place] day!

## 2012-11-05 NOTE — Progress Notes (Signed)
Date:  11/05/2012   ID:  [name], DOB May 21, 1955, MRN [id number]  PCP:  Minda Meo, MD  Primary Cardiologist:  Allyson Sabal    History of Present Illness: [name] is a 57 y.o. female who is moderately overweight married Caucasian with no children who works as a Buyer, retail at [employer]. I saw her because of chest pain. She has risk factors including 25 pack years of tobacco abuse, currently smoking 1 pack per day, type 2 diabetes on an insulin pump, as well as hypertension. She has obstructive sleep apnea on CPAP. She had a Myoview stress test that showed normal images with 2-3 mm of ST-segment depression and because of this and recurrent symptoms of chest pain Dr. Allyson Sabal cathed her revealing a subtotally occluded small 2nd marginal branch (less than 2 mm vessel) and a 75% mid PDA lesion with normal LV function. He elected to treat her medically.   The patient presents today for a six month follow-up.  She reports CP which work her from sleep this morning.  It was tightness and 5/10.  She reports one episode every ten days for the last month.  No associated symptoms and no CP [facility]  She quit smoking months ago.  She currently denies nausea, vomiting, fever, chest pain, shortness of breath, orthopnea, dizziness, PND, cough, congestion, abdominal pain, hematochezia, melena, lower extremity edema, claudication.  Wt Readings from Last 3 Encounters:  11/05/12 237 lb (107.502 kg)  01/04/12 234 lb (106.142 kg)  01/04/12 234 lb (106.142 kg)     Past Medical History  Diagnosis Date  . Type 2 diabetes mellitus     insulin pump  . Hypertension   . OSA on CPAP     AHI = 43 (per patient)  . History of nuclear stress test 12/16/2011    exercise myoview; normal images with 2-30mm ST-segment depression - subsequent cath revelaed subtotally occluded small 2nd marginal branch & 75% PDA lesion, normal LV  function  . Hypothyroidism     Current Outpatient Prescriptions  Medication Sig Dispense Refill  . ALPRAZolam (XANAX) 0.5 MG tablet Take 0.5 mg by mouth at bedtime as needed for sleep.      Marland Kitchen escitalopram (LEXAPRO) 20 MG tablet Take 40 mg by mouth daily.      Marland Kitchen gabapentin (NEURONTIN) 100 MG capsule Take 300 mg by mouth 3 (three) times daily.       . insulin regular (NOVOLIN R,HUMULIN R) 100 units/mL injection Inject into the skin continuous.      Marland Kitchen levothyroxine (SYNTHROID, LEVOTHROID) 100 MCG tablet Take 100 mcg by mouth daily.      . valsartan-hydrochlorothiazide (DIOVAN-HCT) 80-12.5 MG per tablet Take 1 tablet by mouth daily.      Marland Kitchen aspirin 81 MG tablet Take 81 mg by mouth daily.      . isosorbide mononitrate (IMDUR) 30 MG 24 hr tablet Take 1 tablet (30 mg total) by mouth daily.  90 tablet  3   No current facility-administered medications for this visit.    Allergies:    Allergies  Allergen Reactions  . Penicillins Other (See Comments)    Reaction unknown    Social History:  The patient  reports that she has been smoking.  She does not have any smokeless tobacco history on file.   Family history:   Family History  Problem Relation Age of Onset  . Stroke Mother   . Hypertension  Mother   . Diabetes Mother   . Alzheimer's disease Mother   . Diabetes Father   . COPD Father     vent-dependent, MODS  . Hypertension Sister   . Mental illness Sister     borderline personality d/o  . Mental illness Sister     schizoeffective d/o  . Diabetes Sister     ROS:  Please see the history of present illness.  All other systems reviewed and negative.   PHYSICAL EXAM: VS:  BP 126/80  Pulse 73  Ht 5\' 7"  (1.702 m)  Wt 237 lb (107.502 kg)  BMI 37.11 kg/m2 Obese, well developed, in no acute distress HEENT: Pupils are equal round react to light accommodation extraocular movements are intact.  Neck: no JVDNo cervical lymphadenopathy. Cardiac: Regular rate and rhythm without murmurs  rubs or gallops. Lungs:  clear to auscultation bilaterally, no wheezing, rhonchi or rales Abd: soft, nontender, positive bowel sounds all quadrants, no hepatosplenomegaly Ext: no lower extremity edema.  2+ radial and dorsalis pedis pulses. Skin: warm and dry Neuro:  Grossly normal  EKG:  NSR, incomplete RBBB  ASSESSMENT AND PLAN:  Problem List Items Addressed This Visit   Tobacco abuse   OSA on CPAP   Obesity     Patient is agreeable to a consult for a medical nutrition therapy.    HTN (hypertension)   Relevant Medications      isosorbide mononitrate (IMDUR) 24 hr tablet   DM (diabetes mellitus), type 2   CAD (coronary artery disease) - Primary     Does complain of some chest pain one episode every 10 days or so for the last month and had an episode this morning which woke her from sleep.  She has not had any pain while exerting herself [facility]. so not 100% convinced that this is angina however, I am going to start her on Imdur 30 mg.  Iasked her to pay attention to the frequency and intensity of the pain, and if it continues, to call for an appointment.    Relevant Medications      isosorbide mononitrate (IMDUR) 24 hr tablet

## 2012-11-05 NOTE — Assessment & Plan Note (Signed)
Does complain of some chest pain one episode every 10 days or so for the last month and had an episode this morning which woke her from sleep.  She has not had any pain while exerting herself [facility] so not 100% convinced that this is angina however, I am going to start her on Imdur 30 mg.  Iasked her to pay attention to the frequency and intensity of the pain, and if it continues, to call for an appointment.

## 2012-11-05 NOTE — Assessment & Plan Note (Signed)
Patient is agreeable to a consult for a medical nutrition therapy.

## 2013-07-23 ENCOUNTER — Other Ambulatory Visit: Payer: Self-pay

## 2013-07-23 DIAGNOSIS — Z1231 Encounter for screening mammogram for malignant neoplasm of breast: Secondary | ICD-10-CM

## 2013-08-07 ENCOUNTER — Other Ambulatory Visit: Payer: Self-pay

## 2013-08-07 ENCOUNTER — Encounter (INDEPENDENT_AMBULATORY_CARE_PROVIDER_SITE_OTHER): Payer: Self-pay

## 2013-08-07 ENCOUNTER — Ambulatory Visit
Admission: RE | Admit: 2013-08-07 | Discharge: 2013-08-07 | Disposition: A | Discharge status: Home / Self Care | Payer: BC Managed Care – PPO | Source: Ambulatory Visit

## 2013-08-07 DIAGNOSIS — Z1231 Encounter for screening mammogram for malignant neoplasm of breast: Secondary | ICD-10-CM

## 2013-08-09 ENCOUNTER — Other Ambulatory Visit: Payer: Self-pay | Admitting: Internal Medicine

## 2013-08-09 DIAGNOSIS — R928 Other abnormal and inconclusive findings on diagnostic imaging of breast: Secondary | ICD-10-CM

## 2013-08-26 ENCOUNTER — Ambulatory Visit
Admission: RE | Admit: 2013-08-26 | Discharge: 2013-08-26 | Disposition: A | Discharge status: Home / Self Care | Payer: BC Managed Care – PPO | Source: Ambulatory Visit | Attending: Internal Medicine | Admitting: Internal Medicine

## 2013-08-26 DIAGNOSIS — R928 Other abnormal and inconclusive findings on diagnostic imaging of breast: Secondary | ICD-10-CM

## 2013-10-22 ENCOUNTER — Telehealth: Payer: Self-pay | Admitting: *Deleted

## 2013-10-22 NOTE — Telephone Encounter (Signed)
I received a phone call from Dr Virgina Jock asking if patient can be seen.  Patient had an episode of chest discomfort.  I voiced that I would get the patient in to see Dr Gwenlyn Found tomorrow.  I have attempted the patient several times.  The cell number does not have a voicemail set up and I left a message for patient to call back on home number

## 2013-10-23 ENCOUNTER — Ambulatory Visit (INDEPENDENT_AMBULATORY_CARE_PROVIDER_SITE_OTHER): Payer: BC Managed Care – PPO | Admitting: Cardiovascular Disease

## 2013-10-23 ENCOUNTER — Encounter: Payer: Self-pay | Admitting: Cardiovascular Disease

## 2013-10-23 VITALS — BP 140/76 | HR 96 | Ht 67.0 in | Wt 232.0 lb

## 2013-10-23 DIAGNOSIS — I10 Essential (primary) hypertension: Secondary | ICD-10-CM

## 2013-10-23 DIAGNOSIS — I2510 Atherosclerotic heart disease of native coronary artery without angina pectoris: Secondary | ICD-10-CM

## 2013-10-23 NOTE — Assessment & Plan Note (Signed)
Controlled on current medications 

## 2013-10-23 NOTE — Patient Instructions (Signed)
We request that you follow-up in: 3 months with an extender and in 6 months with Dr Berry  You will receive a reminder letter in the mail two months in advance. If you don't receive a letter, please call our office to schedule the follow-up appointment.   

## 2013-10-23 NOTE — Assessment & Plan Note (Signed)
History of CAD status post cardiac catheterization by myself 01/04/12 after a exercise Myoview stress test revealed normal images with abnormal electrocardiographic response to exercise. Her cath revealed a 99% proximal OM 2 stenosis and a very small vessel and a 75% mid PDA stenosis with normal LV function. She has been pain-free for over the past year but did develop an episode of chest pain this past Saturday which awakened her from sleep with left upper extremity radiation. She had recurrent chest pain on Sunday and Tuesday as well. At this point, I do not feel compelled to get a stress test since we already know she has a positive GXT. If she has recurrent chest pain she'll need repeat cardiac catheterization.

## 2013-10-23 NOTE — Progress Notes (Signed)
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 She has a history of insulin-dependent diabetes on insulin pump number hypertension and tobacco abuse. Chills and has obstructive sleep apnea on CPAP. She had chest pain which led to a Myoview stress test 12/16/11 which showed normal images with positive GXT. This led to heart catheterization 01/04/12 revealing a 90% stenosis in a small obtuse marginal branch (less than 2 mm) a 75% stenosis in the mid PDA. Medical therapy was recommended. She has not had chest pain in over a year until this past Saturday night which was awakened from sleep. She had chest pain lasting 2 hours of left upper extremity radiation. She had recurrent chest pain on Sunday and Tuesday as well. She saw Dr. Virgina Jock , her primary care physician, yesterday who performed a 12-lead EKG that was normal. She has had no recurrent chest pain today.   Current Outpatient Prescriptions  Medication Sig Dispense Refill  . ALPRAZolam (XANAX) 0.5 MG tablet Take 0.5 mg by mouth at bedtime as needed for sleep.      Marland Kitchen aspirin 81 MG tablet Take 81 mg by mouth daily.      Marland Kitchen buPROPion (WELLBUTRIN SR) 200 MG 12 hr tablet Take 200 mg by mouth daily.       Marland Kitchen escitalopram (LEXAPRO) 20 MG tablet Take 40 mg by mouth daily.      Marland Kitchen gabapentin (NEURONTIN) 100 MG capsule Take 300 mg by mouth 3 (three) times daily.       . Insulin Human (INSULIN PUMP) SOLN Inject into the skin continuous. Basal dose of 2 units every hour of Humulin R      . insulin regular (NOVOLIN R,HUMULIN R) 100 units/mL injection Inject into the skin continuous.      Marland Kitchen levothyroxine (SYNTHROID, LEVOTHROID) 100 MCG tablet Take 100 mcg by  mouth daily.      . valsartan-hydrochlorothiazide (DIOVAN-HCT) 80-12.5 MG per tablet Take 1 tablet by mouth daily.       No current facility-administered medications for this visit.    Allergies  Allergen Reactions  . Penicillins Other (See Comments)    Reaction unknown    History   Social History  . Marital Status: Married    Spouse Name: N/A    Number of Children: N/A  . Years of Education: 14   Occupational History  . respiratory therapist Other    [employer]   Social History Main Topics  . Smoking status: Current Every Day Smoker -- 1.00 packs/day for 25 years  . Smokeless tobacco: Not on file  . Alcohol Use: Not on file  . Drug Use: Not on file  . Sexual Activity: Not on file   Other Topics Concern  . Not on file   Social History Narrative  . No narrative on file     Review of Systems: General: negative for chills, fever, night sweats or weight changes.  Cardiovascular: negative for chest pain, dyspnea on exertion, edema, orthopnea, palpitations, paroxysmal nocturnal dyspnea or shortness of breath Dermatological: negative for rash Respiratory: negative for cough or wheezing Urologic: negative for hematuria Abdominal: negative for nausea, vomiting, diarrhea, bright red blood per rectum, melena, or hematemesis Neurologic: negative for visual  changes, syncope, or dizziness All other systems reviewed and are otherwise negative except as noted above.    Blood pressure 140/76, pulse 96, height 5\' 7"  (1.702 m), weight 232 lb (105.235 kg).  General appearance: alert and no distress Neck: no adenopathy, no carotid bruit, no JVD, supple, symmetrical, trachea midline and thyroid not enlarged, symmetric, no tenderness/mass/nodules Lungs: clear to auscultation bilaterally Heart: regular rate and rhythm, S1, S2 normal, no murmur, click, rub or gallop Extremities: extremities normal, atraumatic, no cyanosis or edema  EKG normal sinus rhythm at 81 without  ST or T wave changes  ASSESSMENT AND PLAN:   CAD (coronary artery disease) History of CAD status post cardiac catheterization by myself 01/04/12 after a exercise Myoview stress test revealed normal images with abnormal electrocardiographic response to exercise. Her cath revealed a 99% proximal OM 2 stenosis and a very small vessel and a 75% mid PDA stenosis with normal LV function. She has been pain-free for over the past year but did develop an episode of chest pain this past Saturday which awakened her from sleep with left upper extremity radiation. She had recurrent chest pain on Sunday and Tuesday as well. At this point, I do not feel compelled to get a stress test since we already know she has a positive GXT. If she has recurrent chest pain she'll need repeat cardiac catheterization.  HTN (hypertension) Controlled on current medications      Lorretta Harp MD [facility], [facility] 10/23/2013 10:34 AM

## 2013-10-24 NOTE — Telephone Encounter (Signed)
Patient came in for an appointment with Dr Gwenlyn Found.

## 2013-10-24 NOTE — Addendum Note (Signed)
Addended byChauncy Lean. on: 10/24/2013 03:14 PM   Modules accepted: Orders

## 2013-10-31 ENCOUNTER — Encounter: Payer: Self-pay | Admitting: Cardiovascular Disease

## 2013-11-12 ENCOUNTER — Ambulatory Visit: Payer: BC Managed Care – PPO | Admitting: Cardiology

## 2013-11-20 ENCOUNTER — Other Ambulatory Visit: Payer: Self-pay | Admitting: Obstetrics and Gynecology

## 2013-11-21 LAB — CYTOLOGY - PAP

## 2013-12-02 ENCOUNTER — Encounter: Payer: Self-pay | Admitting: Podiatry

## 2013-12-02 ENCOUNTER — Ambulatory Visit (INDEPENDENT_AMBULATORY_CARE_PROVIDER_SITE_OTHER): Payer: BC Managed Care – PPO | Admitting: Podiatry

## 2013-12-02 ENCOUNTER — Ambulatory Visit (INDEPENDENT_AMBULATORY_CARE_PROVIDER_SITE_OTHER): Payer: BC Managed Care – PPO

## 2013-12-02 VITALS — BP 124/67 | HR 92 | Resp 16

## 2013-12-02 DIAGNOSIS — L97511 Non-pressure chronic ulcer of other part of right foot limited to breakdown of skin: Secondary | ICD-10-CM

## 2013-12-02 DIAGNOSIS — L84 Corns and callosities: Secondary | ICD-10-CM | POA: Insufficient documentation

## 2013-12-02 DIAGNOSIS — L97512 Non-pressure 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: Secondary | ICD-10-CM

## 2013-12-02 MED ORDER — AMOXICILLIN-POT CLAVULANATE 875-125 MG PO TABS: 1.0000 | ORAL_TABLET | Freq: Two times a day (BID) | ORAL | Status: DC

## 2013-12-02 MED ORDER — CIPROFLOXACIN HCL 500 MG PO TABS: 500.0000 mg | ORAL_TABLET | Freq: Two times a day (BID) | ORAL | Status: DC

## 2013-12-02 NOTE — Patient Instructions (Signed)
Diabetes and Foot Care Diabetes may cause you to have problems because of poor blood supply (circulation) to your feet and legs. This may cause the skin on your feet to become thinner, break easier, and heal more slowly. Your skin may become dry, and the skin may peel and crack. You may also have nerve damage in your legs and feet causing decreased feeling in them. You may not notice minor injuries to your feet that could lead to infections or more serious problems. Taking care of your feet is one of the most important things you can do for yourself.  HOME CARE INSTRUCTIONS  Wear shoes at all times, even in the house. Do not go barefoot. Bare feet are easily injured.  Check your feet daily for blisters, cuts, and redness. If you cannot see the bottom of your feet, use a mirror or ask someone for help.  Wash your feet with warm water (do not use hot water) and mild soap. Then pat your feet and the areas between your toes until they are completely dry. Do not soak your feet as this can dry your skin.  Apply a moisturizing lotion or petroleum jelly (that does not contain alcohol and is unscented) to the skin on your feet and to dry, brittle toenails. Do not apply lotion between your toes.  Trim your toenails straight across. Do not dig under them or around the cuticle. File the edges of your nails with an emery board or nail file.  Do not cut corns or calluses or try to remove them with medicine.  Wear clean socks or stockings every day. Make sure they are not too tight. Do not wear knee-high stockings since they may decrease blood flow to your legs.  Wear shoes that fit properly and have enough cushioning. To break in new shoes, wear them for just a few hours a day. This prevents you from injuring your feet. Always look in your shoes before you put them on to be sure there are no objects inside.  Do not cross your legs. This may decrease the blood flow to your feet.  If you find a minor scrape,  cut, or break in the skin on your feet, keep it and the skin around it clean and dry. These areas may be cleansed with mild soap and water. Do not cleanse the area with peroxide, alcohol, or iodine.  When you remove an adhesive bandage, be sure not to damage the skin around it.  If you have a wound, look at it several times a day to make sure it is healing.  Do not use heating pads or hot water bottles. They may burn your skin. If you have lost feeling in your feet or legs, you may not know it is happening until it is too late.  Make sure your health care provider performs a complete foot exam at least annually or more often if you have foot problems. Report any cuts, sores, or bruises to your health care provider immediately. SEEK MEDICAL CARE IF:   You have an injury that is not healing.  You have cuts or breaks in the skin.  You have an ingrown nail.  You notice redness on your legs or feet.  You feel burning or tingling in your legs or feet.  You have pain or cramps in your legs and feet.  Your legs or feet are numb.  Your feet always feel cold. SEEK IMMEDIATE MEDICAL CARE IF:   There is increasing redness,   swelling, or pain in or around a wound.  There is a red line that goes up your leg.  Pus is coming from a wound.  You develop a fever or as directed by your health care provider.  You notice a bad smell coming from an ulcer or wound. Document Released: 02/05/2000 Document Revised: 10/10/2012 Document Reviewed: 07/17/2012 ExitCare Patient Information 2015 [employer]. This information is not intended to replace advice given to you by your health care provider. Make sure you discuss any questions you have with your health care provider.  

## 2013-12-02 NOTE — Progress Notes (Signed)
   Subjective:    Patient ID: [name], female    DOB: 05/31/1955, 58 y.o.   MRN: [id number]  HPI Comments: "I have an open place on my toe"  Patient c/o open wound 3rd toe right since Saturday. The area is red and swollen and the skin is peeling off. The 2nd toe is callused at tip. She has been soaking.      Review of Systems  All other systems reviewed and are negative.      Objective:   Physical Exam        Assessment & Plan:

## 2013-12-03 NOTE — Progress Notes (Signed)
Subjective:     Patient ID: [name], female   DOB: 11-Nov-1955, 58 y.o.   MRN: [id number]  HPI patient presents stating her third toe has been draining on her right foot since Saturday and she admits that she has done a terrible job of taking care of her diabetes for over 10 year   Review of Systems  All other systems reviewed and are negative.      Objective:   Physical Exam  Nursing note and vitals reviewed. Constitutional: She is oriented to person, place, and time.  Cardiovascular: Intact distal pulses.   Musculoskeletal: Normal range of motion.  Neurological: She is oriented to person, place, and time.  Skin: Skin is warm and dry.   neurologically I did note that there is a diminishment of vibratory and sharp dull is intact. Patient's noted to have good pulses and digits are warm with hair growth normal. Distal third toe right and second toe are plantarflexed and there is distal keratotic lesions that are painful with mild localized breakdown of the distal third toe right with no proximal edema erythema or drainage noted     Assessment:     Diabetic who has not done a good job of control with moderate neuropathic symptomatology and distal ulceration right third toe localized    Plan:     H&P and x-rays reviewed. I discussed with her the importance of keeping her sugar under good control and she states that she is working to improve at. I debrided distal third toe right flushed the area and applied Iodosorb with dressing. Applied a buttress pad to the toes to lift and instructed on local wound care. Reappoint in the next several weeks and if symptoms were to progress or any increased redness or any indications of systemic infection were to occur she is to go straight to the emergency room and call us

## 2013-12-09 ENCOUNTER — Ambulatory Visit (INDEPENDENT_AMBULATORY_CARE_PROVIDER_SITE_OTHER): Payer: BC Managed Care – PPO | Admitting: Podiatry

## 2013-12-09 ENCOUNTER — Ambulatory Visit (INDEPENDENT_AMBULATORY_CARE_PROVIDER_SITE_OTHER): Payer: BC Managed Care – PPO

## 2013-12-09 ENCOUNTER — Encounter: Payer: Self-pay | Admitting: Podiatry

## 2013-12-09 VITALS — BP 141/77 | HR 89 | Temp 96.6°F | Resp 16

## 2013-12-09 DIAGNOSIS — R52 Pain, unspecified: Secondary | ICD-10-CM

## 2013-12-09 DIAGNOSIS — L97511 Non-pressure chronic ulcer of other part of right foot limited to breakdown of skin: Secondary | ICD-10-CM

## 2013-12-09 DIAGNOSIS — L03031 Cellulitis of right toe: Secondary | ICD-10-CM

## 2013-12-09 NOTE — Progress Notes (Signed)
Subjective:     Patient ID: [name], female   DOB: [birth year], 58 y.o.   MRN: [id number]  HPI patient states that her right toe was doing fine but she work in the garden all weekend and it's been swollen and red today and she wanted to get it checked. Points to the third toe right and states she's been taking her antibiotic and is not felt any chills and her temperature today was 97.5 and her sugar has been running well   Review of Systems     Objective:   Physical Exam Neurovascular status unchanged with mild redness in the third digit right with crusted distal tissue formation that's localized and minimal proximal erythema edema noted upon evaluation    Assessment:     Possible localized infection or other process occurring versus overusing it over the weekend with inflammation occurring    Plan:     H&P and x-ray reviewed. We are going to continue her on her oral antibiotics for 10 more days after completion of the initial antibiotic and I gave her strict instructions that if any increased redness should occur or drainage or pain or if she should develop any systemic signs of infection she is to go straight to the emergency room and contact us. I explained that I need her to take her temperature every day and she is not to do the types of activities she did last week and into this area has completely healed. I cleaned up the distal tissue on the third toe did not note any frank drainage and applied Silvadene and sterile dressing and gave instructions on continuing soaks and using her buttress pad

## 2013-12-16 ENCOUNTER — Telehealth: Payer: Self-pay | Admitting: *Deleted

## 2013-12-16 ENCOUNTER — Ambulatory Visit (INDEPENDENT_AMBULATORY_CARE_PROVIDER_SITE_OTHER): Payer: BC Managed Care – PPO | Admitting: Podiatry

## 2013-12-16 ENCOUNTER — Encounter: Payer: Self-pay | Admitting: Podiatry

## 2013-12-16 VITALS — BP 164/81 | HR 79 | Resp 16

## 2013-12-16 DIAGNOSIS — L97511 Non-pressure chronic ulcer of other part of right foot limited to breakdown of skin: Secondary | ICD-10-CM

## 2013-12-16 DIAGNOSIS — L03031 Cellulitis of right toe: Secondary | ICD-10-CM

## 2013-12-16 MED ORDER — AMOXICILLIN-POT CLAVULANATE 875-125 MG PO TABS: 1.0000 | ORAL_TABLET | Freq: Two times a day (BID) | ORAL | Status: DC

## 2013-12-16 MED ORDER — CIPROFLOXACIN HCL 500 MG PO TABS: 500.0000 mg | ORAL_TABLET | Freq: Two times a day (BID) | ORAL | Status: DC

## 2013-12-16 NOTE — Progress Notes (Signed)
Subjective:     Patient ID: [name], female   DOB: 03-05-1955, 58 y.o.   MRN: [id number]  HPI patient stated my toe was looking red but looking better now and I been checking my temperature every day and it has been normal and my sugar has been running normal   Review of Systems     Objective:   Physical Exam Neurovascular status intact with mild erythema in the third toe right with a crusted distal area on the third toe with no drainage noted or odor and minimal proximal edema and erythema noted.    Assessment:     Previous trauma to the third digit right with no current ulceration drainage or indications of infection local. Continues to have mild redness in the toe and in the forefoot that is not painful when pressed    Plan:     Cannot rule out continued infection right even though the digit itself is healing well and crusted over. We will continue antibiotics for 10 additional days and I did explain if any increased redness were to occur drainage were to reoccur or any systemic signs of infection she is to go straight to the emergency room. Patient will be seen back for me to recheck in 2 weeks earlier if any issues should go on and it still is possible that she may require further treatment or amputation of the third toe depending on response to antibiotics

## 2013-12-16 NOTE — Telephone Encounter (Signed)
Left message informing pt the Augmentin and Cipro were electronically sent to [facility] on [address].

## 2013-12-16 NOTE — Telephone Encounter (Deleted)
Pt states the medication was not at the pharmacy.  Dr. Paulla Dolly ordered refill Augmentin and Cipro as previously ordered.

## 2013-12-17 NOTE — Telephone Encounter (Signed)
Entered in error

## 2013-12-23 ENCOUNTER — Ambulatory Visit: Payer: BC Managed Care – PPO | Admitting: Podiatry

## 2013-12-30 ENCOUNTER — Ambulatory Visit (INDEPENDENT_AMBULATORY_CARE_PROVIDER_SITE_OTHER): Payer: BC Managed Care – PPO | Admitting: Podiatry

## 2013-12-30 ENCOUNTER — Encounter: Payer: Self-pay | Admitting: Podiatry

## 2013-12-30 ENCOUNTER — Ambulatory Visit: Payer: BC Managed Care – PPO | Admitting: Podiatry

## 2013-12-30 VITALS — BP 133/74 | HR 75 | Resp 16

## 2013-12-30 DIAGNOSIS — L97511 Non-pressure chronic ulcer of other part of right foot limited to breakdown of skin: Secondary | ICD-10-CM

## 2013-12-31 NOTE — Progress Notes (Signed)
Subjective:     Patient ID: [name], female   DOB: May 27, 1955, 58 y.o.   MRN: [id number]  HPIpatient presents stating my toe is doing a lot better with diminished swelling and pain and no drainage   Review of Systems     Objective:   Physical Exam Neurovascular status unchanged with a distal keratotic lesion third toe right which is improving with diminished erythema edema and drainage noted    Assessment:     Improving from distal ulceration third toe right secondary to neuropathy and toe position    Plan:     Debridement of tissue with no drainage noted and advised on continuing to use crest pad and padding of the end of the toe. Reappoint for [place] to recheck as needed

## 2014-01-30 ENCOUNTER — Encounter (HOSPITAL_COMMUNITY): Payer: Self-pay | Admitting: Cardiovascular Disease

## 2014-05-06 ENCOUNTER — Encounter: Payer: Self-pay | Admitting: Podiatry

## 2014-05-06 ENCOUNTER — Ambulatory Visit (INDEPENDENT_AMBULATORY_CARE_PROVIDER_SITE_OTHER): Payer: BLUE CROSS/BLUE SHIELD | Admitting: Podiatry

## 2014-05-06 VITALS — BP 124/68 | HR 82 | Resp 18

## 2014-05-06 DIAGNOSIS — L600 Ingrowing nail: Secondary | ICD-10-CM

## 2014-05-06 NOTE — Patient Instructions (Signed)

## 2014-05-06 NOTE — Progress Notes (Signed)
Subjective:     Patient ID: [name], female   DOB: Apr 29, 1955, 59 y.o.   MRN: [id number]  HPI patient presents stating this second nail and end of the digit really drives me crazy. States she's on the palm and her sugars been running very well and she has been healthy   Review of Systems     Objective:   Physical Exam Neurovascular status intact with good digital perfusion and noted to have a thickened painful right second nail bed with distal keratotic tissue formation    Assessment:     Thick nail second right that has moderate trauma to it and is painful when pressed    Plan:     Reviewed condition and discussed treatment options. Due to the fact she cannot control it herself and she does have diabetes I do believe would be best to remove the nail and I did explain the risk of this particular procedure to her. Patient wants the procedure understanding risk and today I infiltrated the right second toe 60 mg I can Marcaine mixture remove the nail of the second toe tissue on the and and applied phenol for applications 30 seconds followed by alcohol lavage and sterile dressing. Explained some tissue  may still form on the hand and instructed on soaks and reappoint

## 2014-05-13 ENCOUNTER — Encounter: Payer: Self-pay | Admitting: Podiatry

## 2014-05-13 ENCOUNTER — Ambulatory Visit (INDEPENDENT_AMBULATORY_CARE_PROVIDER_SITE_OTHER): Payer: BLUE CROSS/BLUE SHIELD | Admitting: Podiatry

## 2014-05-13 VITALS — BP 118/66 | HR 86 | Resp 18

## 2014-05-13 DIAGNOSIS — L03032 Cellulitis of left toe: Secondary | ICD-10-CM

## 2014-05-13 DIAGNOSIS — L03011 Cellulitis of right finger: Secondary | ICD-10-CM

## 2014-05-13 MED ORDER — AMOXICILLIN-POT CLAVULANATE 875-125 MG PO TABS: 1.0000 | ORAL_TABLET | Freq: Two times a day (BID) | ORAL | Status: DC

## 2014-05-14 NOTE — Progress Notes (Signed)
Subjective:     Patient ID: [name], female   DOB: Jan 19, 1956, 59 y.o.   MRN: [id number]  HPI patient states she started to have problems in the last day with her right toe remove the second nail and states she's had some redness and started antibiotics that she had and it's feeling somewhat better today   Review of Systems     Objective:   Physical Exam Neurovascular status intact with normal temperature and states that she has not had fever or systemic signs of infection. Second toe right has some localized redness with no proximal edema erythema noted and there is localized drainage with no odor noted admitting from the nail bed where the nail was removed second toe right    Assessment:     Possible localized infection second toe right that has no indications currently of systemic involvement    Plan:     Reviewed condition and I want her to check her temperature twice a day continue soaks and bandaging during the day and I did write her for more Augmentin 875 twice a day. I gave strict instructions if any indications of systemic infection were to occur to call us and go straight to the emergency room. She will be seen back if symptoms continue to persist over the next couple weeks or if any changes were to occur

## 2014-05-20 ENCOUNTER — Encounter: Payer: Self-pay | Admitting: Podiatry

## 2014-05-20 ENCOUNTER — Ambulatory Visit (INDEPENDENT_AMBULATORY_CARE_PROVIDER_SITE_OTHER): Payer: BLUE CROSS/BLUE SHIELD | Admitting: Podiatry

## 2014-05-20 ENCOUNTER — Ambulatory Visit (INDEPENDENT_AMBULATORY_CARE_PROVIDER_SITE_OTHER): Payer: BLUE CROSS/BLUE SHIELD

## 2014-05-20 VITALS — BP 116/69 | HR 84 | Temp 96.2°F | Resp 18

## 2014-05-20 DIAGNOSIS — R52 Pain, unspecified: Secondary | ICD-10-CM | POA: Diagnosis not present

## 2014-05-20 DIAGNOSIS — L03031 Cellulitis of right toe: Secondary | ICD-10-CM | POA: Diagnosis not present

## 2014-05-20 MED ORDER — CIPROFLOXACIN HCL 500 MG PO TABS: 500.0000 mg | ORAL_TABLET | Freq: Two times a day (BID) | ORAL | Status: DC

## 2014-05-21 NOTE — Progress Notes (Signed)
Subjective:     Patient ID: [name], female   DOB: Aug 12, 1955, 59 y.o.   MRN: [id number]  HPI patient presents stating the second toe is still giving me trouble. She has been taking her temperature every day and it's been normal and her sugars been running normal but the end of the second toe continues to drain and she stated there was an odor within   Review of Systems     Objective:   Physical Exam Neurovascular status is found to be unchanged with good digital perfusion noted. There is on the distal aspect of the toe a area of distal drainage not related to the nail that had been removed previously. This does not have current odor and I did not note at this time proximal edema erythema or drainage noted    Assessment:     Probable localized infection with possibility for osteomyelitic process with long-term diabetic    Plan:     At this time we did do a culture of the area and I re-x-rayed. I noted the third digit distal had destructive changes from where she had previous keratotic lesion formation but the second toe at this time looks to be intact but there is a possibility that she may have osteomyelitic process and I did explain that there is a possibility that she is going to lose either a part of this toe or require digital amputation. I did add Cipro also on as an antibiotic and I gave her strict instructions to check her temperature every day and if there should be any changes or she should soak any signs of systemic changes or changes and sugar she is to let us know immediately and go to the emergency room. I also explained she may need IV antibiotics if she does not respond and also reviewed we will have to wait for the results of the culture to determine if we need any other types of antibiotics. She is dispensed surgical shoe to keep pressure off the toe and we'll also continue with soaks and be seen back again in 1 week or earlier if anything changes

## 2014-05-28 ENCOUNTER — Ambulatory Visit (INDEPENDENT_AMBULATORY_CARE_PROVIDER_SITE_OTHER): Payer: 59 | Admitting: Podiatry

## 2014-05-28 DIAGNOSIS — L97511 Non-pressure chronic ulcer of other part of right foot limited to breakdown of skin: Secondary | ICD-10-CM

## 2014-05-28 DIAGNOSIS — L03031 Cellulitis of right toe: Secondary | ICD-10-CM | POA: Diagnosis not present

## 2014-05-28 DIAGNOSIS — L89891 Pressure ulcer of other site, stage 1: Secondary | ICD-10-CM

## 2014-05-29 NOTE — Progress Notes (Signed)
Subjective:     Patient ID: [name], female   DOB: 01-04-1956, 58 y.o.   MRN: [id number]  HPI patient states my right second toe is starting to look a little better and the redness is pretty much cleared but it still is draining on the end   Review of Systems     Objective:   Physical Exam Neurovascular status unchanged with chronic lesion distal second toe right that is localized with no proximal edema erythema or drainage noted. Measures approximately 4 x 4 mm with minimal subcutaneous exposure    Assessment:     Ulceration of the right distal second toe with long-term diabetes under good control and no indications of systemic infection present    Plan:     Continue with elevation of the digit continue soaks and begin Iodosorb usage and continue antibiotics for the near future. Strict instructions of increased redness or other issues for the patient to let us know immediately and if not reappoint one week

## 2014-06-04 ENCOUNTER — Ambulatory Visit: Payer: 59 | Admitting: Podiatry

## 2014-06-05 ENCOUNTER — Ambulatory Visit (INDEPENDENT_AMBULATORY_CARE_PROVIDER_SITE_OTHER): Payer: 59 | Admitting: Podiatry

## 2014-06-05 ENCOUNTER — Encounter: Payer: Self-pay | Admitting: Podiatry

## 2014-06-05 VITALS — BP 135/81 | HR 89 | Resp 12

## 2014-06-05 DIAGNOSIS — L89891 Pressure ulcer of other site, stage 1: Secondary | ICD-10-CM | POA: Diagnosis not present

## 2014-06-05 DIAGNOSIS — L97511 Non-pressure chronic ulcer of other part of right foot limited to breakdown of skin: Secondary | ICD-10-CM

## 2014-06-05 DIAGNOSIS — L03031 Cellulitis of right toe: Secondary | ICD-10-CM

## 2014-06-06 NOTE — Progress Notes (Signed)
Subjective:     Patient ID: [name], female   DOB: [birth year], 59 y.o.   MRN: [id number]  HPI patient states my toe is doing much better with the drainage and ending and the swelling not significant    Review of Systems     Objective:   Physical Exam [name] status intact with distal keratotic lesion second right that continues to improve with a minimal amount of subcutaneous exposure measuring about 3 x 3 mm with no odor and no erythema within the toe itself    Assessment:     Doing well post ulceration right second toe    Plan:     Debrided tissue flushed the area and applied a small amount of Iodosorb with sterile dressing. Continue with reduced pressure on the toe and this should heal uneventfully and if any issues were to occur let us know immediately

## 2014-06-19 ENCOUNTER — Encounter: Payer: Self-pay | Admitting: Podiatry

## 2014-06-19 ENCOUNTER — Ambulatory Visit (INDEPENDENT_AMBULATORY_CARE_PROVIDER_SITE_OTHER): Payer: 59 | Admitting: Podiatry

## 2014-06-19 VITALS — BP 121/61 | HR 80 | Resp 15

## 2014-06-19 DIAGNOSIS — L89891 Pressure ulcer of other site, stage 1: Secondary | ICD-10-CM

## 2014-06-19 DIAGNOSIS — L03031 Cellulitis of right toe: Secondary | ICD-10-CM | POA: Diagnosis not present

## 2014-06-19 DIAGNOSIS — L97511 Non-pressure chronic ulcer of other part of right foot limited to breakdown of skin: Secondary | ICD-10-CM

## 2014-06-21 NOTE — Progress Notes (Signed)
Subjective:     Patient ID: [name], female   DOB: 03/26/1955, 59 y.o.   MRN: [id number]  HPI patient states my toe is doing much better and it's no longer draining   Review of Systems     Objective:   Physical Exam Neurovascular status intact muscle strength was noted to be within normal limits and the second toe right the distal keratotic lesion is doing much better with no indications currently of active ulceration    Assessment:     Healed from also her second toe right foot    Plan:     Debrided tissue and advised on importance of using her buttress pad and wider-type shoes and reappoint to recheck

## 2014-07-11 ENCOUNTER — Ambulatory Visit (HOSPITAL_COMMUNITY): Payer: 59 | Attending: Cardiology

## 2014-07-11 ENCOUNTER — Other Ambulatory Visit (HOSPITAL_COMMUNITY): Payer: Self-pay | Admitting: *Deleted

## 2014-07-11 DIAGNOSIS — E119 Type 2 diabetes mellitus without complications: Secondary | ICD-10-CM | POA: Insufficient documentation

## 2014-07-11 DIAGNOSIS — I2510 Atherosclerotic heart disease of native coronary artery without angina pectoris: Secondary | ICD-10-CM | POA: Diagnosis not present

## 2014-07-11 DIAGNOSIS — Z720 Tobacco use: Secondary | ICD-10-CM | POA: Insufficient documentation

## 2014-07-11 DIAGNOSIS — M79605 Pain in left leg: Secondary | ICD-10-CM | POA: Insufficient documentation

## 2014-07-11 DIAGNOSIS — I10 Essential (primary) hypertension: Secondary | ICD-10-CM | POA: Insufficient documentation

## 2014-07-24 ENCOUNTER — Encounter: Payer: Self-pay | Admitting: Podiatry

## 2014-07-24 ENCOUNTER — Ambulatory Visit (INDEPENDENT_AMBULATORY_CARE_PROVIDER_SITE_OTHER): Payer: 59 | Admitting: Podiatry

## 2014-07-24 ENCOUNTER — Ambulatory Visit (INDEPENDENT_AMBULATORY_CARE_PROVIDER_SITE_OTHER): Payer: 59

## 2014-07-24 VITALS — BP 143/65 | HR 95 | Temp 97.6°F | Resp 12

## 2014-07-24 DIAGNOSIS — L03031 Cellulitis of right toe: Secondary | ICD-10-CM | POA: Diagnosis not present

## 2014-07-24 DIAGNOSIS — L89891 Pressure ulcer of other site, stage 1: Secondary | ICD-10-CM | POA: Diagnosis not present

## 2014-07-24 DIAGNOSIS — L97511 Non-pressure chronic ulcer of other part of right foot limited to breakdown of skin: Secondary | ICD-10-CM

## 2014-07-24 MED ORDER — CIPROFLOXACIN HCL 500 MG PO TABS: 500.0000 mg | ORAL_TABLET | Freq: Two times a day (BID) | ORAL | Status: DC

## 2014-07-24 MED ORDER — AMOXICILLIN-POT CLAVULANATE 875-125 MG PO TABS: 1.0000 | ORAL_TABLET | Freq: Two times a day (BID) | ORAL | Status: DC

## 2014-07-24 NOTE — Progress Notes (Signed)
   Subjective:    Patient ID: [name], female    DOB: 06-25-1955, 59 y.o.   MRN: [id number]  HPI  Patient has necrotic, infected 3rd toe on Right foot. Causing her pain and discomfort. Is not running a temperature   Review of Systems  Constitutional: Positive for fatigue.  Gastrointestinal: Positive for nausea and vomiting.  Skin: Positive for color change.       Objective:   Physical Exam        Assessment & Plan:

## 2014-07-25 NOTE — Progress Notes (Signed)
Subjective:     Patient ID: [name], female   DOB: 02-06-1956, 59 y.o.   MRN: [id number]  HPI patient states the second toe U worked on is doing very well but the third toe just in the last several days has started to drain and is red and sore. Patient does not remember specific injury and stated it just started in the last few days   Review of Systems     Objective:   Physical Exam Vascular status intact muscle strength found to be adequate and patient's sugars been under control. Her temperature was 97 and I noted the third toe right has erythema and distal drainage noted with no proximal edema erythema or drainage noted past the MPJ. Patient's second toe is healed very well with no redness and no keratotic lesion formation    Assessment:     Infection right third toe with indications on x-rays [place] my lytic changes of the second and third toes but the second toe has healed very well with no indications currently of any infection process    Plan:     Reviewed the localized nature of condition and x-rays with patient. We are start her again on anti-biotics consisting of Augmentin and Cipro and I gave her strict instructions on soaks and wearing a open toed shoe and applying Neosporin to the end of the toe. I cleaned up all tissue and I did culture today and I discussed at great length that there is a possibility she may require amputation of third and possibly second toe and that also if she should develop any proximal redness or any fever or any systemic signs of infection she is to go straight to the emergency room. Reappoint in 1 week

## 2014-07-31 ENCOUNTER — Ambulatory Visit (INDEPENDENT_AMBULATORY_CARE_PROVIDER_SITE_OTHER): Payer: 59 | Admitting: Podiatry

## 2014-07-31 ENCOUNTER — Encounter: Payer: Self-pay | Admitting: Podiatry

## 2014-07-31 VITALS — BP 122/68 | HR 79 | Resp 12

## 2014-07-31 DIAGNOSIS — L89891 Pressure ulcer of other site, stage 1: Secondary | ICD-10-CM

## 2014-07-31 DIAGNOSIS — L97511 Non-pressure chronic ulcer of other part of right foot limited to breakdown of skin: Secondary | ICD-10-CM

## 2014-07-31 DIAGNOSIS — L03031 Cellulitis of right toe: Secondary | ICD-10-CM

## 2014-07-31 NOTE — Progress Notes (Signed)
   Subjective:    Patient ID: [name], female    DOB: 12/04/1955, 59 y.o.   MRN: [id number]  HPI Patient R 3 toe is doing great! Causing her no pain. She is taking her antibiotics every day. Toe looks significantly better. Still a little redness   Review of Systems     Objective:   Physical Exam        Assessment & Plan:

## 2014-08-04 NOTE — Progress Notes (Signed)
Subjective:     Patient ID: [name], female   DOB: Jan 19, 1956, 59 y.o.   MRN: [id number]  HPI patient presents stating her toe is doing a lot better and has not been draining and while it still a little bit swollen does not hurt at all   Review of Systems     Objective:   Physical Exam Her vascular status intact muscle strength was adequate with significant improvement in the right third digit with distal keratotic lesions still present but improving with no drainage odor noted and mild erythema in the digit itself with no proximal edema erythema or drainage noted    Assessment:     Improved from ulceration third toe right    Plan:     Reviewed with her that I'm still concerned about bone changes and that we will need to watch these toes and that the possibility still exist in the future that she may end up with amputations of her second and third digits. Patient understands all of this and at this time will be seen back to check

## 2014-08-28 ENCOUNTER — Encounter: Payer: Self-pay | Admitting: Cardiovascular Disease

## 2014-10-31 ENCOUNTER — Ambulatory Visit (INDEPENDENT_AMBULATORY_CARE_PROVIDER_SITE_OTHER): Payer: 59 | Admitting: Podiatry

## 2014-10-31 ENCOUNTER — Ambulatory Visit (INDEPENDENT_AMBULATORY_CARE_PROVIDER_SITE_OTHER): Payer: 59

## 2014-10-31 ENCOUNTER — Encounter: Payer: Self-pay | Admitting: Podiatry

## 2014-10-31 VITALS — BP 110/49 | HR 84 | Resp 16

## 2014-10-31 DIAGNOSIS — L89891 Pressure ulcer of other site, stage 1: Secondary | ICD-10-CM

## 2014-10-31 DIAGNOSIS — L97511 Non-pressure chronic ulcer of other part of right foot limited to breakdown of skin: Secondary | ICD-10-CM

## 2014-10-31 DIAGNOSIS — M79671 Pain in right foot: Secondary | ICD-10-CM

## 2014-11-03 ENCOUNTER — Encounter (HOSPITAL_BASED_OUTPATIENT_CLINIC_OR_DEPARTMENT_OTHER): Payer: Self-pay | Admitting: *Deleted

## 2014-11-03 ENCOUNTER — Emergency Department (HOSPITAL_BASED_OUTPATIENT_CLINIC_OR_DEPARTMENT_OTHER): Payer: 59

## 2014-11-03 ENCOUNTER — Emergency Department (HOSPITAL_BASED_OUTPATIENT_CLINIC_OR_DEPARTMENT_OTHER)
Admission: EM | Admit: 2014-11-03 | Discharge: 2014-11-03 | Disposition: A | Discharge status: Home / Self Care | Payer: 59 | Attending: Emergency Medicine | Admitting: Emergency Medicine

## 2014-11-03 DIAGNOSIS — I10 Essential (primary) hypertension: Secondary | ICD-10-CM | POA: Insufficient documentation

## 2014-11-03 DIAGNOSIS — Z794 Long term (current) use of insulin: Secondary | ICD-10-CM | POA: Insufficient documentation

## 2014-11-03 DIAGNOSIS — N200 Calculus of kidney: Secondary | ICD-10-CM

## 2014-11-03 DIAGNOSIS — Z720 Tobacco use: Secondary | ICD-10-CM | POA: Insufficient documentation

## 2014-11-03 DIAGNOSIS — G4733 Obstructive sleep apnea (adult) (pediatric): Secondary | ICD-10-CM | POA: Diagnosis not present

## 2014-11-03 DIAGNOSIS — N838 Other noninflammatory disorders of ovary, fallopian tube and broad ligament: Secondary | ICD-10-CM | POA: Insufficient documentation

## 2014-11-03 DIAGNOSIS — Z880 Allergy status to penicillin: Secondary | ICD-10-CM | POA: Insufficient documentation

## 2014-11-03 DIAGNOSIS — Z79899 Other long term (current) drug therapy: Secondary | ICD-10-CM | POA: Diagnosis not present

## 2014-11-03 DIAGNOSIS — I2510 Atherosclerotic heart disease of native coronary artery without angina pectoris: Secondary | ICD-10-CM | POA: Diagnosis not present

## 2014-11-03 DIAGNOSIS — R109 Unspecified abdominal pain: Secondary | ICD-10-CM | POA: Diagnosis present

## 2014-11-03 DIAGNOSIS — E119 Type 2 diabetes mellitus without complications: Secondary | ICD-10-CM | POA: Diagnosis not present

## 2014-11-03 DIAGNOSIS — E039 Hypothyroidism, unspecified: Secondary | ICD-10-CM | POA: Insufficient documentation

## 2014-11-03 DIAGNOSIS — Z9981 Dependence on supplemental oxygen: Secondary | ICD-10-CM | POA: Diagnosis not present

## 2014-11-03 LAB — CBC WITH DIFFERENTIAL/PLATELET
BAND NEUTROPHILS: 6 % (ref 0–10)
BASOS PCT: 0 % (ref 0–1)
Basophils Absolute: 0 10*3/uL (ref 0.0–0.1)
Blasts: 0 %
EOS ABS: 0 10*3/uL (ref 0.0–0.7)
EOS PCT: 0 % (ref 0–5)
HCT: 43.1 % (ref 36.0–46.0)
Hemoglobin: 14.2 g/dL (ref 12.0–15.0)
LYMPHS ABS: 1.8 10*3/uL (ref 0.7–4.0)
LYMPHS PCT: 14 % (ref 12–46)
MCH: 31.1 pg (ref 26.0–34.0)
MCHC: 32.9 g/dL (ref 30.0–36.0)
MCV: 94.3 fL (ref 78.0–100.0)
MONO ABS: 0.3 10*3/uL (ref 0.1–1.0)
Metamyelocytes Relative: 0 %
Monocytes Relative: 2 % — ABNORMAL LOW (ref 3–12)
Myelocytes: 1 %
NEUTROS ABS: 10.7 10*3/uL — AB (ref 1.7–7.7)
NEUTROS PCT: 77 % (ref 43–77)
NRBC: 0 /100{WBCs}
OTHER: 0 %
PLATELETS: 228 10*3/uL (ref 150–400)
Promyelocytes Absolute: 0 %
RBC: 4.57 MIL/uL (ref 3.87–5.11)
RDW: 12.8 % (ref 11.5–15.5)
WBC: 12.8 10*3/uL — ABNORMAL HIGH (ref 4.0–10.5)

## 2014-11-03 LAB — BASIC METABOLIC PANEL
Anion gap: 9 (ref 5–15)
BUN: 16 mg/dL (ref 6–20)
CALCIUM: 9.6 mg/dL (ref 8.9–10.3)
CHLORIDE: 101 mmol/L (ref 101–111)
CO2: 24 mmol/L (ref 22–32)
CREATININE: 0.78 mg/dL (ref 0.44–1.00)
Glucose, Bld: 309 mg/dL — ABNORMAL HIGH (ref 65–99)
Potassium: 3.9 mmol/L (ref 3.5–5.1)
SODIUM: 134 mmol/L — AB (ref 135–145)

## 2014-11-03 LAB — URINE MICROSCOPIC-ADD ON

## 2014-11-03 LAB — URINALYSIS, ROUTINE W REFLEX MICROSCOPIC
Bilirubin Urine: NEGATIVE
GLUCOSE, UA: 500 mg/dL — AB
KETONES UR: 15 mg/dL — AB
LEUKOCYTES UA: NEGATIVE
NITRITE: NEGATIVE
PH: 6.5 (ref 5.0–8.0)
Protein, ur: 30 mg/dL — AB
SPECIFIC GRAVITY, URINE: 1.019 (ref 1.005–1.030)
Urobilinogen, UA: 1 mg/dL (ref 0.0–1.0)

## 2014-11-03 MED ORDER — TAMSULOSIN HCL 0.4 MG PO CAPS
0.4000 mg | ORAL_CAPSULE | Freq: Once | ORAL | Status: AC
  Administered 2014-11-03: 0.4 mg via ORAL
  Filled 2014-11-03: qty 1

## 2014-11-03 MED ORDER — KETOROLAC TROMETHAMINE 30 MG/ML IJ SOLN
30.0000 mg | Freq: Once | INTRAMUSCULAR | Status: AC
  Administered 2014-11-03: 30 mg via INTRAVENOUS
  Filled 2014-11-03: qty 1

## 2014-11-03 MED ORDER — ONDANSETRON HCL 4 MG/2ML IJ SOLN
4.0000 mg | Freq: Once | INTRAMUSCULAR | Status: AC
  Administered 2014-11-03: 4 mg via INTRAVENOUS
  Filled 2014-11-03: qty 2

## 2014-11-03 MED ORDER — ONDANSETRON 4 MG PO TBDP: 4.0000 mg | ORAL_TABLET | Freq: Three times a day (TID) | ORAL | Status: DC | PRN

## 2014-11-03 MED ORDER — HYDROCODONE-ACETAMINOPHEN 5-325 MG PO TABS: 2.0000 | ORAL_TABLET | ORAL | Status: DC | PRN

## 2014-11-03 MED ORDER — SODIUM CHLORIDE 0.9 % IV BOLUS (SEPSIS)
1000.0000 mL | Freq: Once | INTRAVENOUS | Status: AC
  Administered 2014-11-03: 1000 mL via INTRAVENOUS

## 2014-11-03 NOTE — ED Provider Notes (Signed)
CSN: [id number]     Arrival date & time 11/03/14  0758 History   First MD Initiated Contact with Patient 11/03/14 0801     No chief complaint on file.     HPI  Patient presents for evaluation of flank pain. She waking this morning with a wave of nausea and sudden pain in her right flank to her right groin. Pain was sudden and severe. She called her husband to come take her to the hospital. However, by the time he returned home she states that it nearly resolved within 2025 minutes. The second episode of pain and nausea that came on again suddenly in severe brings her in. It has again nearly resolved prior to her evaluation here.  Nausea but no vomiting. No hematuria but does feel the urge to void. Does not have menses. This yearly evaluations with an OB/GYN. She has had a human papilloma virus but has never had abnormal exam before. No history of ovarian pathology. Past Medical History  Diagnosis Date  . Type 2 diabetes mellitus     insulin pump  . Hypertension   . OSA on CPAP     AHI = 43 (per patient)  . History of nuclear stress test 12/16/2011    exercise myoview; normal images with 2-70mm ST-segment depression - subsequent cath revelaed subtotally occluded small 2nd marginal branch & 75% PDA lesion, normal LV function  . Hypothyroidism   . Coronary artery disease   . Tobacco abuse    Past Surgical History  Procedure Laterality Date  . Cardiac catheterization  01/04/2012    subtotally occluded small 2nd marginal branch & 75% PDA lesion, normal LV function  . Cholecystectomy  2008  . Cataract extraction Bilateral   . Breast enhancement surgery Bilateral   . Left heart catheterization with coronary angiogram N/A 01/04/2012    Procedure: LEFT HEART CATHETERIZATION WITH CORONARY ANGIOGRAM;  Surgeon: Lorretta Harp, MD;  Location: [facility] CATH LAB;  Service: Cardiovascular;  Laterality: N/A;   Family History  Problem Relation Age of Onset  . Stroke Mother   . Hypertension Mother   .  Diabetes Mother   . Alzheimer's disease Mother   . Diabetes Father   . COPD Father     vent-dependent, MODS  . Hypertension Sister   . Mental illness Sister     borderline personality d/o  . Mental illness Sister     schizoeffective d/o  . Diabetes Sister    Social History  Substance Use Topics  . Smoking status: Current Every Day Smoker -- 1.00 packs/day for 25 years    Types: Cigarettes  . Smokeless tobacco: None  . Alcohol Use: None   OB History    No data available     Review of Systems  Constitutional: Negative for fever, chills, diaphoresis, appetite change and fatigue.  HENT: Negative for mouth sores, sore throat and trouble swallowing.   Eyes: Negative for visual disturbance.  Respiratory: Negative for cough, chest tightness, shortness of breath and wheezing.   Cardiovascular: Negative for chest pain.  Gastrointestinal: Positive for nausea. Negative for vomiting, abdominal pain, diarrhea and abdominal distention.  Endocrine: Negative for polydipsia, polyphagia and polyuria.  Genitourinary: Positive for dysuria and flank pain. Negative for frequency and hematuria.  Musculoskeletal: Negative for gait problem.  Skin: Negative for color change, pallor and rash.  Neurological: Negative for dizziness, syncope, light-headedness and headaches.  Hematological: Does not bruise/bleed easily.  Psychiatric/Behavioral: Negative for behavioral problems and confusion.  Allergies  Penicillins  Home Medications   Prior to Admission medications   Medication Sig Start Date End Date Taking? Authorizing Provider  ALPRAZolam Duanne Moron) 0.5 MG tablet Take 0.5 mg by mouth at bedtime as needed for sleep.   Yes Historical Provider, MD  buPROPion (WELLBUTRIN SR) 200 MG 12 hr tablet Take 200 mg by mouth daily.  08/26/13  Yes Historical Provider, MD  escitalopram (LEXAPRO) 20 MG tablet Take 40 mg by mouth daily.   Yes Historical Provider, MD  gabapentin (NEURONTIN) 100 MG capsule Take 300  mg by mouth 3 (three) times daily.    Yes Historical Provider, MD  HUMALOG 100 UNIT/ML injection  03/27/14  Yes Historical Provider, MD  Insulin Human (INSULIN PUMP) SOLN Inject into the skin continuous. Basal dose of 2 units every hour of Humulin R   Yes Historical Provider, MD  insulin regular (NOVOLIN R,HUMULIN R) 100 units/mL injection Inject into the skin continuous.   Yes Historical Provider, MD  levothyroxine (SYNTHROID, LEVOTHROID) 100 MCG tablet Take 100 mcg by mouth daily.   Yes Historical Provider, MD  valsartan-hydrochlorothiazide (DIOVAN-HCT) 80-12.5 MG per tablet Take 1 tablet by mouth daily.   Yes Historical Provider, MD  HYDROcodone-acetaminophen (NORCO/VICODIN) 5-325 MG per tablet Take 2 tablets by mouth every 4 (four) hours as needed. 11/03/14   Tanna Furry, MD  ondansetron (ZOFRAN ODT) 4 MG disintegrating tablet Take 1 tablet (4 mg total) by mouth every 8 (eight) hours as needed for nausea. 11/03/14   Tanna Furry, MD   BP 167/71 mmHg  Pulse 74  Temp(Src) 98.1 F (36.7 C) (Oral)  Resp 16  Ht 5\' 7"  (1.702 m)  Wt 227 lb (102.967 kg)  BMI 35.55 kg/m2 Physical Exam  Constitutional: She is oriented to person, place, and time. She appears well-developed and well-nourished. No distress.  HENT:  Head: Normocephalic.  Eyes: Conjunctivae are normal. Pupils are equal, round, and reactive to light. No scleral icterus.  Neck: Normal range of motion. Neck supple. No thyromegaly present.  Cardiovascular: Normal rate and regular rhythm.  Exam reveals no gallop and no friction rub.   No murmur heard. Pulmonary/Chest: Effort normal and breath sounds normal. No respiratory distress. She has no wheezes. She has no rales.  Abdominal: Soft. Bowel sounds are normal. She exhibits no distension. There is no tenderness. There is no rebound.    Musculoskeletal: Normal range of motion.  Neurological: She is alert and oriented to person, place, and time.  Skin: Skin is warm and dry. No rash noted.    Psychiatric: She has a normal mood and affect. Her behavior is normal.    ED Course  Procedures (including critical care time) Labs Review Labs Reviewed  URINALYSIS, ROUTINE W REFLEX MICROSCOPIC (NOT AT [facility]) - Abnormal; Notable for the following:    Glucose, UA 500 (*)    Hgb urine dipstick SMALL (*)    Ketones, ur 15 (*)    Protein, ur 30 (*)    All other components within normal limits  CBC WITH DIFFERENTIAL/PLATELET - Abnormal; Notable for the following:    WBC 12.8 (*)    Monocytes Relative 2 (*)    Neutro Abs 10.7 (*)    All other components within normal limits  BASIC METABOLIC PANEL - Abnormal; Notable for the following:    Sodium 134 (*)    Glucose, Bld 309 (*)    All other components within normal limits  URINE MICROSCOPIC-ADD ON - Abnormal; Notable for the following:  Bacteria, UA FEW (*)    All other components within normal limits  CA 125    Imaging Review Ct Renal Stone Study  11/03/2014   CLINICAL DATA:  Right flank pain since 6:30 this morning with nausea and vomiting.  EXAM: CT ABDOMEN AND PELVIS WITHOUT CONTRAST  TECHNIQUE: Multidetector CT imaging of the abdomen and pelvis was performed following the standard protocol without IV contrast.  COMPARISON:  November 24, 2004  FINDINGS: The liver, spleen, pancreas and right adrenal gland is normal. There is a 1.8 cm nodule in the left adrenal gland unchanged compared to prior exam. The patient is status post prior cholecystectomy. There is 3.2 cm umbilical herniation of mesenteric fat.  The left kidney is normal. There is a 1 mm nonobstructing stone in the midpole right kidney. There is right perinephric stranding with mild right hydronephrosis. No definite obstructing stone is identified in the course of the right ureter.  There is no small bowel obstruction or diverticulitis. The appendix is normal.  In the pelvis, there are heterogeneous density masses abutting each other probably arising from bilateral ovaries with  compression of the adjacent uterus. The mass on the right right measures 7.3 x 9.7 x 8.3 cm. The mass on the left measures 7.8 x 7.1 x 7.7 cm.  The bladder is normal appearing. No significant pelvic lymphadenopathy is identified. Images of the lung bases demonstrate mild atelectasis of the posterior right lung base. Degenerative joint changes of the spine are identified.  IMPRESSION: Right perinephric stranding with mild right hydronephrosis probably due to compression obstruction by the masses in the pelvis. No definite obstructing stones are identified in the course of the right ureter.  There are heterogeneous density masses/ neoplasm abutting each other probably arising from bilateral ovaries with compression of the subadjacent uterus. These are new compared to prior CT of October 2006. Recommend GYN consultation.   Electronically Signed   By: Abelardo Diesel M.D.   On: 11/03/2014 09:06   I have personally reviewed and evaluated these images and lab results as part of my medical decision-making.   EKG Interpretation None      MDM   Final diagnoses:  Ovarian mass  Flank pain  Kidney stone    I discussed the CT findings at length with the patient. She is given copies of the CT scan and her labs. I ordered a CA-125 on her. She states that she sees an OB/GYN,  [facility], in [place]. I've asked her to call for an appointment for further evaluation. I told her in no uncertain terms that this may represent an ovarian malignancy and that expeditious diagnosis and treatment are important. She expressed an understanding of this as did her husband who is in the room during our discussion. I have given her prescription for Zofran and Vicodin for her pain. I think her pain very likely was a past ureteral stone. Last CT in 2016 showed renal stones. I think the ovarian malignancy is likely incidental. However, the hydronephrosis on CT could be secondary to pelvic compression of her ureter. With the sudden  episodic and now resolved nature of her pain I think this is very likely stone and that the mass is incidental. Nonetheless I have stressed to her the importance of early follow-up.    Tanna Furry, MD 11/03/14 [phone]

## 2014-11-03 NOTE — Progress Notes (Signed)
Subjective:     Patient ID: [name], female   DOB: [birth year], 59 y.o.   MRN: [id number]  HPI patient states her right third toe has a lesion at the end of it and she wanted to make sure there was no infection   Review of Systems     Objective:   Physical Exam Neurovascular status is intact with patient stating she's had no further changes in her diabetic condition and has been running numbers that have been good and states that the toe has not been as swollen as it been previously. Upon evaluation the third digit does have mild edema but no current erythema with distal keratotic lesion with slight breakdown of tissue with keratotic tissue on the second toe that's present but localized with no proximal edema erythema or drainage noted    Assessment:     Patient has neuropathic changes long-term diabetes with history of ulceration right third toe with infection. Currently there is slight localized breakdown but no indications of systemic or other type of infection    Plan:     X-ray taken his precautionary measure and debrided the lesion and reapplied padding to take pressure off of it. I did explain that this may eventually become cellulitic and that she may eventually develop osteomyelitic-type process but at this time she appears stable and hopefully with continued debridement and offloading we can prevent her from requiring more extensive surgery but possibility does exist that eventual amputation will be necessary

## 2014-11-03 NOTE — Discharge Instructions (Signed)
Your symptoms today may have been from a kidney stone that you have already passed.  Your CT scan shows an ovarian mass. It will require further testing including a biopsy to determine if this is ovarian cancer.  Call your OB/GYN to make an appointment.

## 2014-11-03 NOTE — ED Notes (Signed)
C/o n/v. Describes wave of pain in right lower quad that comes and goes.

## 2014-11-04 LAB — CA 125: CA 125: 15.3 U/mL (ref 0.0–38.1)

## 2014-11-06 ENCOUNTER — Other Ambulatory Visit (HOSPITAL_COMMUNITY): Payer: Self-pay | Admitting: Obstetrics and Gynecology

## 2014-11-06 DIAGNOSIS — N838 Other noninflammatory disorders of ovary, fallopian tube and broad ligament: Secondary | ICD-10-CM

## 2014-11-12 ENCOUNTER — Ambulatory Visit (HOSPITAL_COMMUNITY)
Admission: RE | Admit: 2014-11-12 | Discharge: 2014-11-12 | Disposition: A | Discharge status: Home / Self Care | Payer: 59 | Source: Ambulatory Visit | Attending: Obstetrics and Gynecology | Admitting: Obstetrics and Gynecology

## 2014-11-12 DIAGNOSIS — N839 Noninflammatory disorder of ovary, fallopian tube and broad ligament, unspecified: Secondary | ICD-10-CM | POA: Insufficient documentation

## 2014-11-12 DIAGNOSIS — N838 Other noninflammatory disorders of ovary, fallopian tube and broad ligament: Secondary | ICD-10-CM

## 2014-11-17 ENCOUNTER — Encounter: Payer: Self-pay | Admitting: Gynecologic Oncology

## 2014-11-17 ENCOUNTER — Other Ambulatory Visit: Payer: 59

## 2014-11-17 ENCOUNTER — Ambulatory Visit: Payer: 59 | Attending: Gynecologic Oncology | Admitting: Gynecologic Oncology

## 2014-11-17 VITALS — BP 141/66 | HR 74 | Temp 98.1°F | Resp 18 | Ht 67.0 in | Wt 242.7 lb

## 2014-11-17 DIAGNOSIS — Z794 Long term (current) use of insulin: Secondary | ICD-10-CM | POA: Insufficient documentation

## 2014-11-17 DIAGNOSIS — E669 Obesity, unspecified: Secondary | ICD-10-CM | POA: Diagnosis not present

## 2014-11-17 DIAGNOSIS — I2510 Atherosclerotic heart disease of native coronary artery without angina pectoris: Secondary | ICD-10-CM | POA: Insufficient documentation

## 2014-11-17 DIAGNOSIS — E1159 Type 2 diabetes mellitus with other circulatory complications: Secondary | ICD-10-CM | POA: Insufficient documentation

## 2014-11-17 DIAGNOSIS — N839 Noninflammatory disorder of ovary, fallopian tube and broad ligament, unspecified: Secondary | ICD-10-CM | POA: Diagnosis not present

## 2014-11-17 DIAGNOSIS — N838 Other noninflammatory disorders of ovary, fallopian tube and broad ligament: Secondary | ICD-10-CM

## 2014-11-17 NOTE — Progress Notes (Signed)
Consult Note: Gyn-Onc  Consult was requested by Dr. Gaetano Net for the evaluation of [name] 59 y.o. female with ovarian cysts (bilateral)  CC:  Chief Complaint  Patient presents with  . Bilateral ovarian masses    New consult    Assessment/Plan:  [name]  is a 59 y.o.  year old with bilateral complex cystic and solid ovarian cysts with a normal CA 125 (15) and multiple medical comorbidities including obesity (BMI 38kg/m2), poorly controlled type II DM (on an insulin pump) and CAD.  That I overall have a low suspicion that these masses represent malignancy given their appearance on imaging (which we reviewed together in the room with the patient), normal CA-125, and lack of associated findings on imaging suggestive of ovarian cancer. However given the complex nature of these cysts in a postmenopausal age woman I recommend a surgical removal and pathologic testing. I therefore recommend robotic assisted total hysterectomy with BSO and possible staging. I discussed that we will perform frozen section on the ovaries intraoperatively, and if malignancy is identified we will perform lymphadenectomy and omentectomy as deemed appropriate. She has a narrow vaginal introitus and large ovarian masses and I do not believe that these will be amenable to a vaginal delivery of the specimens. However due to her obesity, diabetes mellitus, and concerns with healing from a large laparotomy, I believe a minilaparotomy for specimen delivery would be preferable to a primary surgery via laparotomy.  I discussed with the patient that she is at increased risk for consultation particularly wound healing, infection, and damage to internal organs due to her obesity and poor controlled diabetes.  We will have her see her primary care physician Dr. Reynaldo Minium preoperatively for optimization of her diabetes, and we will reassess her HbA1c today. I discussed with the patient that it is critically important that  she maintains to close strict glucose control perioperatively to minimize complications. She also has a history of coronary artery disease and sees Dr. Gwenlyn Found for this. We will have her see Dr. Gwenlyn Found preoperatively for risk stratification and optimization preoperatively as his be more than a year since she was last seen him. I discussed with the patient that she is an increased risk for coronary event perioperatively. She denies taking and her platelet therapy.   HPI: [name] is a 59 year old G0 who is seen in consultation at the request of Dr. Gertie Fey for bilateral ovarian cysts. The patient developed an episode of flank pain in early September 2016. On 11/03/2014 she underwent a CT scan of the abdomen and pelvis and this revealed a 1 mm not instructing stone in the right kidney. However there was an incidental finding of bilateral ovarian masses adjacent to the uterus. The mass on the right measured 7.3 x 9.7 x 8.3 cm. The mass on the left measured 7.8 x 7.1 x 7.7 cm. There was no evidence of adenopathy, ascites, or peritoneal carcinomatosis.  The patient was then seen by gynecologist Dr. Suezanne Cheshire add who performed a pelvic ultrasound which confirmed the above findings that was seen on CT scan. This ultrasound performed on 11/12/2014. A CA-125 was drawn on 11/21/2014 and was normal at 15.3.  The patient is asymptomatic with these masses and has no abdominal bloating early satiety or pelvic pain. It if she is no family history for breast or ovarian cancer. She is nulliparous. She has significant medical history for poorly controlled type 2 diabetes mellitus completed by coronary artery disease. She takes an insulin pump. She  reports her last HbA1c was 9%. She is able to and/or upper fight of stairs but does develop some shortness of breath with this. She is no chest pain at rest or with walking up a flight of stairs. She has a history of confirmed coronary artery disease but has not had coronary artery stenting  and is not on anti-platelet therapy.  Current Meds:  Outpatient Encounter Prescriptions as of 11/17/2014  Medication Sig  . ALPRAZolam (XANAX) 0.5 MG tablet Take 0.5 mg by mouth at bedtime as needed for sleep.  Marland Kitchen buPROPion (WELLBUTRIN SR) 200 MG 12 hr tablet Take 200 mg by mouth daily.   Marland Kitchen escitalopram (LEXAPRO) 20 MG tablet Take 40 mg by mouth daily.  Marland Kitchen gabapentin (NEURONTIN) 100 MG capsule Take 300 mg by mouth 3 (three) times daily.   Marland Kitchen HYDROcodone-acetaminophen (NORCO/VICODIN) 5-325 MG per tablet Take 2 tablets by mouth every 4 (four) hours as needed.  . Insulin Human (INSULIN PUMP) SOLN Inject into the skin continuous. Basal dose of 2 units every hour of Humulin R  . insulin regular (NOVOLIN R,HUMULIN R) 100 units/mL injection Inject into the skin continuous.  Marland Kitchen levothyroxine (SYNTHROID, LEVOTHROID) 100 MCG tablet Take 100 mcg by mouth daily.  . ondansetron (ZOFRAN ODT) 4 MG disintegrating tablet Take 1 tablet (4 mg total) by mouth every 8 (eight) hours as needed for nausea.  . valsartan-hydrochlorothiazide (DIOVAN-HCT) 80-12.5 MG per tablet Take 1 tablet by mouth daily.  . [DISCONTINUED] HUMALOG 100 UNIT/ML injection    No facility-administered encounter medications on file as of 11/17/2014.    Allergy:  Allergies  Allergen Reactions  . Penicillins Other (See Comments)    Reaction unknown    Social Hx:   Social History   Social History  . Marital Status: Married    Spouse Name: N/A  . Number of Children: N/A  . Years of Education: 14   Occupational History  . respiratory therapist Other    [employer]   Social History Main Topics  . Smoking status: Current Every Day Smoker -- 1.00 packs/day for 25 years    Types: Cigarettes  . Smokeless tobacco: Not on file  . Alcohol Use: No  . Drug Use: No  . Sexual Activity: Not on file   Other Topics Concern  . Not on file   Social History Narrative    Past Surgical Hx:  Past Surgical History  Procedure  Laterality Date  . Cardiac catheterization  01/04/2012    subtotally occluded small 2nd marginal branch & 75% PDA lesion, normal LV function  . Cholecystectomy  2008  . Cataract extraction Bilateral   . Breast enhancement surgery Bilateral   . Left heart catheterization with coronary angiogram N/A 01/04/2012    Procedure: LEFT HEART CATHETERIZATION WITH CORONARY ANGIOGRAM;  Surgeon: Lorretta Harp, MD;  Location: [facility] CATH LAB;  Service: Cardiovascular;  Laterality: N/A;    Past Medical Hx:  Past Medical History  Diagnosis Date  . Type 2 diabetes mellitus     insulin pump  . Hypertension   . OSA on CPAP     AHI = 43 (per patient)  . History of nuclear stress test 12/16/2011    exercise myoview; normal images with 2-20mm ST-segment depression - subsequent cath revelaed subtotally occluded small 2nd marginal branch & 75% PDA lesion, normal LV function  . Hypothyroidism   . Coronary artery disease   . Tobacco abuse     Past Gynecological History:  No prior abnormal paps.  G0  No LMP recorded. Patient is postmenopausal.  Family Hx:  Family History  Problem Relation Age of Onset  . Stroke Mother   . Hypertension Mother   . Diabetes Mother   . Alzheimer's disease Mother   . Diabetes Father   . COPD Father     vent-dependent, MODS  . Hypertension Sister   . Mental illness Sister     borderline personality d/o  . Mental illness Sister     schizoeffective d/o  . Diabetes Sister     Review of Systems:  Constitutional  Feels well,    ENT Normal appearing ears and nares bilaterally Skin/Breast  No rash, sores, jaundice, itching, dryness Cardiovascular  No chest pain, shortness of breath, or edema  Pulmonary  No cough or wheeze.  Gastro Intestinal  No nausea, vomitting, or diarrhoea. No bright red blood per rectum, no abdominal pain, change in bowel movement, or constipation.  Genito Urinary  No frequency, urgency, dysuria, see HPI Musculo Skeletal  No myalgia,  arthralgia, joint swelling or pain  Neurologic  No weakness, numbness, change in gait,  Psychology  No depression, anxiety, insomnia.   Vitals:  Blood pressure 141/66, pulse 74, temperature 98.1 F (36.7 C), temperature source Oral, resp. rate 18, height 5\' 7"  (1.702 m), weight 242 lb 11.2 oz (110.088 kg).  Physical Exam: WD in NAD Neck  Supple NROM, without any enlargements.  Lymph Node Survey No cervical supraclavicular or inguinal adenopathy Cardiovascular  Pulse normal rate, regularity and rhythm. S1 and S2 normal.  Lungs  Clear to auscultation bilateraly, without wheezes/crackles/rhonchi. Good air movement.  Skin  No rash/lesions/breakdown  Psychiatry  Alert and oriented to person, place, and time  Abdomen  Normoactive bowel sounds, abdomen soft, non-tender and obese without evidence of hernia.  Back No CVA tenderness Genito Urinary  Vulva/vagina: Normal external female genitalia.  No lesions. No discharge or bleeding.  Bladder/urethra:  No lesions or masses, well supported bladder  Vagina: normal  Cervix: Normal appearing, no lesions.  Uterus:  Small, mobile, no parametrial involvement or nodularity.  Adnexa: no palpable masses. Exam limited due to body habitus. Rectal  Good tone, no masses no cul de sac nodularity.  Extremities  No bilateral cyanosis, clubbing or edema.   Donaciano Eva, MD  11/17/2014, 2:37 PM

## 2014-11-17 NOTE — Patient Instructions (Signed)
Preparing for your Surgery  Plan for surgery on October 25 with Dr. Denman George.  We will check a HgbA1C today and we will need to obtain cardiac clearance from Dr. Gwenlyn Found.    Pre-operative Testing -You will receive a phone call from presurgical testing at [facility] to arrange for a pre-operative testing appointment before your surgery.  This appointment normally occurs one to two weeks before your scheduled surgery.   -Bring your insurance card, copy of an advanced directive if applicable, medication list  -At that visit, you will be asked to sign a consent for a possible blood transfusion in case a transfusion becomes necessary during surgery.  The need for a blood transfusion is rare but having consent is a necessary part of your care.     -You should not be taking blood thinners or aspirin at least ten days prior to surgery unless instructed by your surgeon.  Day Before Surgery at [facility] will be asked to take in only clear liquids the day before surgery.  Examples of clear liquids include broths, jello, and clear juices.  Avoid carbonated beverages.  You will be advised to have nothing to eat or drink after midnight the evening before.    Your role in recovery Your role is to become active as soon as directed by your doctor, while still giving yourself time to heal.  Rest when you feel tired. You will be asked to do the following in order to speed your recovery:  - Cough and breathe deeply. This helps toclear and expand your lungs and can prevent pneumonia. You may be given a spirometer to practice deep breathing. A staff member will show you how to use the spirometer. - Do mild physical activity. Walking or moving your legs help your circulation and body functions return to normal. A staff member will help you when you try to walk and will provide you with simple exercises. Do not try to get up or walk alone the first time. - Actively manage your pain. Managing your  pain lets you move in comfort. We will ask you to rate your pain on a scale of zero to 10. It is your responsibility to tell your doctor or nurse where and how much you hurt so your pain can be treated.  Special Considerations -If you are diabetic, you may be placed on insulin after surgery to have closer control over your blood sugars to promote healing and recovery.  This does not mean that you will be discharged on insulin.  If applicable, your oral antidiabetics will be resumed when you are tolerating a solid diet.  -Your final pathology results from surgery should be available by the Friday after surgery and the results will be relayed to you when available.  Blood Transfusion Information WHAT IS A BLOOD TRANSFUSION? A transfusion is the replacement of blood or some of its parts. Blood is made up of multiple cells which provide different functions.  Red blood cells carry oxygen and are used for blood loss replacement.  White blood cells fight against infection.  Platelets control bleeding.  Plasma helps clot blood.  Other blood products are available for specialized needs, such as hemophilia or other clotting disorders. BEFORE THE TRANSFUSION  Who gives blood for transfusions?   You may be able to donate blood to be used at a later date on yourself (autologous donation).  Relatives can be asked to donate blood. This is generally not any safer than if you have received  blood from a stranger. The same precautions are taken to ensure safety when a relative's blood is donated.  Healthy volunteers who are fully evaluated to make sure their blood is safe. This is blood bank blood. Transfusion therapy is the safest it has ever been in the practice of medicine. Before blood is taken from a donor, a complete history is taken to make sure that person has no history of diseases nor engages in risky social behavior (examples are intravenous drug use or sexual activity with multiple partners).  The donor's travel history is screened to minimize risk of transmitting infections, such as malaria. The donated blood is tested for signs of infectious diseases, such as HIV and hepatitis. The blood is then tested to be sure it is compatible with you in order to minimize the chance of a transfusion reaction. If you or a relative donates blood, this is often done in anticipation of surgery and is not appropriate for emergency situations. It takes many days to process the donated blood. RISKS AND COMPLICATIONS Although transfusion therapy is very safe and saves many lives, the main dangers of transfusion include:   Getting an infectious disease.  Developing a transfusion reaction. This is an allergic reaction to something in the blood you were given. Every precaution is taken to prevent this. The decision to have a blood transfusion has been considered carefully by your caregiver before blood is given. Blood is not given unless the benefits outweigh the risks.

## 2014-11-18 ENCOUNTER — Telehealth: Payer: Self-pay | Admitting: Cardiovascular Disease

## 2014-11-18 NOTE — Telephone Encounter (Signed)
Routed to [facility] admin pool to schedule patient for OV for pre-op clearance

## 2014-11-18 NOTE — Telephone Encounter (Signed)
Pt is going to have a hysterectomy in October. She will need clarence for this surgery.Dr Denman George at [facility] is the doctor

## 2014-11-18 NOTE — Telephone Encounter (Signed)
Pt is going to have a hysterectomy in October. She will need clarence for this surgery.Dr Denman George at [facility] is the doctor.She did not have a fax number.

## 2014-11-18 NOTE — Telephone Encounter (Signed)
I haven't seen patient in over a year. She'll need a return office visit for evaluation and clearance prior to her surgery

## 2014-11-19 ENCOUNTER — Telehealth: Payer: Self-pay | Admitting: *Deleted

## 2014-11-19 NOTE — Telephone Encounter (Signed)
Called and rescheduled patient's cardiology appt to 11/27/14 at 1:30pm (with 1pm arrival time) to allow for time in case she needs any additional evaluation prior to scheduled surgery on 12/16/14. Called patient and left VM with new appt and requested return call confirming she received message and is agreeable to new appt.

## 2014-11-20 DIAGNOSIS — G609 Hereditary and idiopathic neuropathy, unspecified: Secondary | ICD-10-CM | POA: Insufficient documentation

## 2014-11-21 NOTE — Telephone Encounter (Signed)
Left VM for patient requesting return call to confirm she is able to go to new cardiology appt. Also inquiring if patient was able to reach her endocrinologist and find out when a repeat Hemoglobin A1C is planned.

## 2014-11-25 ENCOUNTER — Telehealth: Payer: Self-pay | Admitting: *Deleted

## 2014-11-25 NOTE — Telephone Encounter (Signed)
Received most recent Hemoglobin A1C result (which was 10.0) from 11/20/14 at Dr. Jacquiline Doe office. Reviewed by Dr. Denman George and she states she will still operate on patient but patient needs to be aware of increased surgical and post-op risks related to uncontrolled blood sugars or we can postpone patient's surgery to allow for better blood sugar optimization.   Called and left VM for patient requesting return call so information above can be discussed.

## 2014-11-27 ENCOUNTER — Ambulatory Visit (INDEPENDENT_AMBULATORY_CARE_PROVIDER_SITE_OTHER): Payer: 59 | Admitting: Physician Assistant

## 2014-11-27 ENCOUNTER — Encounter: Payer: Self-pay | Admitting: Physician Assistant

## 2014-11-27 VITALS — BP 136/66 | HR 73 | Ht 67.0 in | Wt 243.4 lb

## 2014-11-27 DIAGNOSIS — I2510 Atherosclerotic heart disease of native coronary artery without angina pectoris: Secondary | ICD-10-CM

## 2014-11-27 DIAGNOSIS — Z01818 Encounter for other preprocedural examination: Secondary | ICD-10-CM | POA: Diagnosis not present

## 2014-11-27 MED ORDER — NICOTINE 14 MG/24HR TD PT24: 14.0000 mg | MEDICATED_PATCH | Freq: Every day | TRANSDERMAL | Status: DC

## 2014-11-27 NOTE — Progress Notes (Signed)
Cardiology Office Note    Date:  11/27/2014   ID:  Tila Millirons, DOB [birth year], MRN [id number]  PCP:  [name], MD  Cardiologist: Drr.Berry   CC: pre op clearance for hysterectomy.    History of Present Illness: Yehudis Monceaux is a 59 y.o. female with a hx of poorly controlled IDDM, HTN, tobacco abuse, OSA on CPAP, obesity, CAD and bilateral ovarian cysts who presents for pre-op clearance prior to planned robotic assisted total hysterectomy with BSO and possible staging.  She had an abnormal stress cardiolite study with anterior apical ischemia in 2012. A subesquent heart cath by Dr. Acie Fredrickson revealed normal coronary arteries and normal LV function. She had chest pain which led to a Myoview stress test 12/16/11 which showed normal images with positive GXT. This led to heart catheterization 01/04/12 revealing a 99% stenosis in a small obtuse marginal branch (less than 2 mm) a 75% stenosis in the mid PDA. Medical therapy was recommended.  She was last seen in the office by Dr. Gwenlyn Found in 10/2013 where she complained of chest pain. He did not feel like another GXT would be of value as it was previously abnormal and felt cath was indicated if she had further chest pain. I do not see any subsequent notes and assume she did not have anymore symptoms.  The patient developed an episode of flank pain in early September 2016. On 11/03/2014 she underwent a CT scan of the abdomen and pelvis and this revealed a 1 mm not instructing stone in the right kidney. However there was an incidental finding of bilateral ovarian masses adjacent to the uterus. She is followed by Dr. Denman George who has a low suspicion that these masses represent malignancy given their appearance on imaging, normal CA-125, and lack of associated findings on imaging suggestive of ovarian cancer. However given the complex nature of these cysts in a postmenopausal age woman she recommended a surgical removal and pathologic testing.    She presents today for pre-operative clearance. She has had no chest pain or SOB. She denies exertional symptoms. She is able to vacuum and walk up stairs with no issues. She is feeling pretty good. She has no complaints. No dizziness or lightheadedness. No LE edema orthopnea or PND. No bleeding. Still smoking a PPD. Willing to try something to help her quit   Studies:  - LHC (01/04/12) 99% stenosis in a small OM1 branch (less than 2 mm) a 75% stenosis in the mid PDA. Medical therapy was recommended.    - Stress Nuclear (12/16/11):  No evidence of inducible ischemia. EF 74%. Stress ECG w/ up to 3 mm horizontal and downsloping ST depression in II, III, AVF, v4-v6 at peak exercise which persisted into recovery. Marked hypertensive response and chest pain was noted.      Recent Labs/Images:   Recent Labs  11/03/14 0825  NA 134*  K 3.9  BUN 16  CREATININE 0.78  HGB 14.2     US Transvaginal Non-ob  11/12/2014   CLINICAL DATA:  Bilateral mass is identified within the ovaries.  EXAM: TRANSABDOMINAL AND TRANSVAGINAL ULTRASOUND OF PELVIS  TECHNIQUE: Both transabdominal and transvaginal ultrasound examinations of the pelvis were performed. Transabdominal technique was performed for global imaging of the pelvis including uterus, ovaries, adnexal regions, and pelvic cul-de-sac. It was necessary to proceed with endovaginal exam following the transabdominal exam to visualize the uterus and endometrium.  COMPARISON:  11/03/2014  FINDINGS: Uterus  Measurements: 7.8 x 4.0 x 4.1 cm. No fibroids  or other mass visualized.  Endometrium  Thickness: 7 mm.  No focal abnormality visualized.  Right ovary  Measurements: No normal right ovary identified. There is a complex, multi septated cystic mass containing septations and nodularity. This measures 7 x 9.5 x 8.9 cm. Some of the cystic components of this mass appear complicated by hemorrhage. Solid-appearing nodule associated with this structure measured 2 cm and  contains some increased peripheral blood flow.  Left ovary  Measurements: No normal left ovary identified. There is a large complex, multi septated cystic mass measuring 12.7 x 7.6 x 8.7 cm. Similar to the right adnexal mass some of the cystic components appear complicated by hemorrhage.  Other findings  No free fluid.  IMPRESSION: Bilateral complex cystic masses are identified within both ovaries. Findings are suspicious for benign or malignant cystic ovarian neoplasm. Surgical evaluation is recommended.   Electronically Signed   By: Kerby Moors M.D.   On: 11/12/2014 13:51   US Pelvis Complete  11/12/2014   CLINICAL DATA:  Bilateral mass is identified within the ovaries.  EXAM: TRANSABDOMINAL AND TRANSVAGINAL ULTRASOUND OF PELVIS  TECHNIQUE: Both transabdominal and transvaginal ultrasound examinations of the pelvis were performed. Transabdominal technique was performed for global imaging of the pelvis including uterus, ovaries, adnexal regions, and pelvic cul-de-sac. It was necessary to proceed with endovaginal exam following the transabdominal exam to visualize the uterus and endometrium.  COMPARISON:  11/03/2014  FINDINGS: Uterus  Measurements: 7.8 x 4.0 x 4.1 cm. No fibroids or other mass visualized.  Endometrium  Thickness: 7 mm.  No focal abnormality visualized.  Right ovary  Measurements: No normal right ovary identified. There is a complex, multi septated cystic mass containing septations and nodularity. This measures 7 x 9.5 x 8.9 cm. Some of the cystic components of this mass appear complicated by hemorrhage. Solid-appearing nodule associated with this structure measured 2 cm and contains some increased peripheral blood flow.  Left ovary  Measurements: No normal left ovary identified. There is a large complex, multi septated cystic mass measuring 12.7 x 7.6 x 8.7 cm. Similar to the right adnexal mass some of the cystic components appear complicated by hemorrhage.  Other findings  No free fluid.   IMPRESSION: Bilateral complex cystic masses are identified within both ovaries. Findings are suspicious for benign or malignant cystic ovarian neoplasm. Surgical evaluation is recommended.   Electronically Signed   By: Kerby Moors M.D.   On: 11/12/2014 13:51     Wt Readings from Last 3 Encounters:  11/17/14 242 lb 11.2 oz (110.088 kg)  11/03/14 227 lb (102.967 kg)  10/23/13 232 lb (105.235 kg)     Past Medical History  Diagnosis Date  . Type 2 diabetes mellitus     insulin pump  . Hypertension   . OSA on CPAP     AHI = 43 (per patient)  . History of nuclear stress test 12/16/2011    exercise myoview; normal images with 2-2mm ST-segment depression - subsequent cath revelaed subtotally occluded small 2nd marginal branch & 75% PDA lesion, normal LV function  . Hypothyroidism   . Coronary artery disease   . Tobacco abuse     Current Outpatient Prescriptions  Medication Sig Dispense Refill  . ALPRAZolam (XANAX) 0.5 MG tablet Take 0.5 mg by mouth at bedtime as needed for sleep.    Marland Kitchen buPROPion (WELLBUTRIN SR) 200 MG 12 hr tablet Take 200 mg by mouth daily.     Marland Kitchen escitalopram (LEXAPRO) 20 MG tablet Take 40 mg  by mouth daily.    Marland Kitchen gabapentin (NEURONTIN) 100 MG capsule Take 300 mg by mouth 3 (three) times daily.     Marland Kitchen HYDROcodone-acetaminophen (NORCO/VICODIN) 5-325 MG per tablet Take 2 tablets by mouth every 4 (four) hours as needed. 10 tablet 0  . Insulin Human (INSULIN PUMP) SOLN Inject into the skin continuous. Basal dose of 2 units every hour of Humulin R    . insulin regular (NOVOLIN R,HUMULIN R) 100 units/mL injection Inject into the skin continuous.    Marland Kitchen levothyroxine (SYNTHROID, LEVOTHROID) 100 MCG tablet Take 100 mcg by mouth daily.    . ondansetron (ZOFRAN ODT) 4 MG disintegrating tablet Take 1 tablet (4 mg total) by mouth every 8 (eight) hours as needed for nausea. 20 tablet 0  . valsartan-hydrochlorothiazide (DIOVAN-HCT) 80-12.5 MG per tablet Take 1 tablet by mouth daily.      No current facility-administered medications for this visit.     Allergies:   Penicillins   Social History:  The patient  reports that she has been smoking Cigarettes.  She has a 25 pack-year smoking history. She does not have any smokeless tobacco history on file. She reports that she does not drink alcohol or use illicit drugs.   Family History:  The patient's family history includes Alzheimer's disease in her mother; COPD in her father; Diabetes in her father, mother, and sister; Hypertension in her mother and sister; Mental illness in her sister and sister; Stroke in her mother.   ROS:  Please see the history of present illness.   All other systems reviewed and negative.    PHYSICAL EXAM: VS:  There were no vitals taken for this visit. Well nourished, well developed, in no acute distressobese HEENT: normal Neck: no JVD Cardiac:  normal S1, S2; RRR; no murmur Lungs:  clear to auscultation bilaterally, no wheezing, rhonchi or rales Abd: soft, nontender, no hepatomegaly Ext: no edema Skin: warm and dry Neuro:  CNs 2-12 intact, no focal abnormalities noted  EKG:  HR 73. NSR no acute ST or TW changes   ASSESSMENT AND PLAN:   Jajaira Ruis is a 59 y.o. female with a hx of poorly controlled IDDM, HTN, tobacco abuse, OSA on CPAP, obesity, CAD and bilateral ovarian cysts who presents for pre-op clearance prior to planned robotic assisted total hysterectomy with BSO and possible staging.  Pre Op Clearance: she has a history of CAD with LHC  01/04/12 revealing a 99% stenosis in a small obtuse marginal branch (less than 2 mm) a 75% stenosis in the mid PDA. Medical therapy was recommended.  -- She has had no chest pain or SOB. She denies exertional symptoms. She is able to vacuum and walk up stairs with no issues. I spoke with the DOD, Dr. Harrington Challenger, who agreed that no further ischemic w/up was recommended prior to surgery.  -- She was previously on ASA. This has fallen off her list.  Unclear why she is not on a BB or statin with known CAD. I will have her resume taking ASA. Will defer further changes in medical management to Dr. Gwenlyn Found at follow up appointment after surgery  IDDM- HgA1c 10 and surgery postponed until she can get better control over her DM.   HTN- 136/66 mm Hg. Well controlled. Continue Diovan-HCT  OSA- cont CPAP  Tobacco abuse- still smoking 1PPD. Willing to try Chantix but i talked to our PharmD, Gay Filler, who said that it interacts with Wellbutrin which she already takes. She is willing to try nicotine  patches. I will have this called in.   Disposition:   FU with Dr. Gwenlyn Found 60mo   Signed, Vesta Mixer, PA-C, [facility] 11/27/2014 6:22 AM    [facility] [address], [place], [place]  [address] Phone: [phone]; Fax: [phone]

## 2014-11-27 NOTE — Patient Instructions (Addendum)
Medication Instructions:   Your physician recommends that you continue on your current medications as directed.    START USING NICODERM NICOTINE PATCHES. START TAKING BABY ASA  Labwork:  NONE ORDER TODAY   Testing/Procedures:  NONE ORDER TODAY   Follow-Up:  Your physician wants you to follow-up in:  IN 6 MONTHS WITH DR Gwenlyn Found  You will receive a reminder letter in the mail two months in advance. If you don't receive a letter, please call our office to schedule the follow-up appointment.     Any Other Special Instructions Will Be Listed Below (If Applicable).

## 2014-12-08 ENCOUNTER — Telehealth: Payer: Self-pay

## 2014-12-08 NOTE — Telephone Encounter (Signed)
Prior auth for Nicotine patch 14mg /24 hr sent to [facility],

## 2014-12-10 ENCOUNTER — Ambulatory Visit: Payer: 59 | Admitting: Physician Assistant

## 2014-12-12 ENCOUNTER — Telehealth: Payer: Self-pay

## 2014-12-12 NOTE — Telephone Encounter (Signed)
Nicotine 14mg  patiches approved by [facility].Marland Kitchen DE#[id number]. Good through 12/08/15.

## 2014-12-18 ENCOUNTER — Other Ambulatory Visit: Payer: Self-pay | Admitting: Orthopedic Surgery

## 2014-12-19 ENCOUNTER — Encounter (HOSPITAL_COMMUNITY): Payer: Self-pay | Admitting: *Deleted

## 2014-12-19 NOTE — Progress Notes (Signed)
Anesthesia Chart Review:  Pt is 59 year old female scheduled for R 2nd toe amputation on 12/22/2014 with Dr. Doran Durand.   Pt is a same day work up.   Cardiologist is Dr. Quay Burow. PCP is Dr. Burnard Bunting  PMH includes: CAD, HTN, DM (uses insulin pump), fatty liver, OSA (on CPAP), hypothyroidism. Former smoker. BMI 38.   Medications include: augmentin, humulin, levothyroxine, valsartan-htcz.   Labs will be obtained DOS. Pt reportedly had hgbA1c of 10 on 11/20/14.   EKG 11/27/2014: NSR.  Cardiac cath 01/04/2012 (for abnormal stress test): 1. Left main; normal  2. LAD; essentially normal with minor irregularities 3. Left circumflex; there was a ramus intermedius branch that was moderate in size and normal. OM1 was a small vessel that was subtotally occluded with a 99% stenosis in the proximal third with TIMI 2 flow. This was at most a 1.47mm vessel.  4. Right coronary artery; dominant with a 75% stenosis in the PDA two thirds down the vessel. 5. The overall LVEF estimated 60 % Without wall motion abnormalities IMPRESSION:Ms. Argabright has two-vessel disease with a moderate severity stenosis in the distal PDA is subtotally occluded small OM 2. Medical therapy recommended.   Pt saw Vesta Mixer, PA on 11/27/14 for cardiology for pre-op eval for hysterectomy scheduled in December. Pt was given cardiac clearance for this surgery without additional testing.   If labs acceptable DOS, I anticipate pt can proceed as scheduled.    Willeen Cass, FNP-BC [facility]/Anesthesiology Phone: [phone] 12/19/2014 1:09 PM

## 2014-12-19 NOTE — Progress Notes (Signed)
Pt has a history of CAD, last cath was 2013, no stents. Saw PA for Dr. Gwenlyn Found on 11/27/14 to get cardiac clearance for upcoming GYN surgery. At the time, pt did not know she would be having her toe amputated. She denies any chest pain or SOB. Did get cardiac clearance.  Pt is a Type 2 Diabetic on an insulin pump. Instructed pt to call Dr. Jacquiline Doe office today to get instructions on possibly decreasing basal rate of insulin Sunday night. She voiced understanding. Last A1C was 10.0 on 11/20/14.  Sonia Baller, Diabetic Coordinator has been notified of pt's surgery on 12/22/14.

## 2014-12-21 MED ORDER — VANCOMYCIN HCL IN DEXTROSE 1-5 GM/200ML-% IV SOLN
1000.0000 mg | INTRAVENOUS | Status: AC
  Administered 2014-12-22: 1000 mg via INTRAVENOUS
  Filled 2014-12-21: qty 200

## 2014-12-22 ENCOUNTER — Encounter (HOSPITAL_COMMUNITY): Payer: Self-pay | Admitting: *Deleted

## 2014-12-22 ENCOUNTER — Ambulatory Visit (HOSPITAL_COMMUNITY)
Admission: RE | Admit: 2014-12-22 | Discharge: 2014-12-22 | Disposition: A | Discharge status: Home / Self Care | Payer: 59 | Source: Ambulatory Visit | Attending: Orthopedic Surgery | Admitting: Orthopedic Surgery

## 2014-12-22 ENCOUNTER — Ambulatory Visit (HOSPITAL_COMMUNITY): Payer: 59 | Admitting: Emergency Medicine

## 2014-12-22 ENCOUNTER — Encounter (HOSPITAL_COMMUNITY)
Admission: RE | Disposition: A | Discharge status: Home / Self Care | Payer: Self-pay | Source: Ambulatory Visit | Attending: Orthopedic Surgery

## 2014-12-22 DIAGNOSIS — Z87891 Personal history of nicotine dependence: Secondary | ICD-10-CM | POA: Diagnosis not present

## 2014-12-22 DIAGNOSIS — G4733 Obstructive sleep apnea (adult) (pediatric): Secondary | ICD-10-CM | POA: Insufficient documentation

## 2014-12-22 DIAGNOSIS — E1140 Type 2 diabetes mellitus with diabetic neuropathy, unspecified: Secondary | ICD-10-CM | POA: Insufficient documentation

## 2014-12-22 DIAGNOSIS — Z9641 Presence of insulin pump (external) (internal): Secondary | ICD-10-CM | POA: Insufficient documentation

## 2014-12-22 DIAGNOSIS — M869 Osteomyelitis, unspecified: Secondary | ICD-10-CM | POA: Insufficient documentation

## 2014-12-22 DIAGNOSIS — I2510 Atherosclerotic heart disease of native coronary artery without angina pectoris: Secondary | ICD-10-CM | POA: Diagnosis not present

## 2014-12-22 DIAGNOSIS — E11621 Type 2 diabetes mellitus with foot ulcer: Secondary | ICD-10-CM | POA: Diagnosis not present

## 2014-12-22 DIAGNOSIS — Z6836 Body mass index (BMI) 36.0-36.9, adult: Secondary | ICD-10-CM | POA: Insufficient documentation

## 2014-12-22 DIAGNOSIS — E039 Hypothyroidism, unspecified: Secondary | ICD-10-CM | POA: Insufficient documentation

## 2014-12-22 DIAGNOSIS — I10 Essential (primary) hypertension: Secondary | ICD-10-CM | POA: Diagnosis not present

## 2014-12-22 DIAGNOSIS — E1169 Type 2 diabetes mellitus with other specified complication: Secondary | ICD-10-CM

## 2014-12-22 DIAGNOSIS — Z79899 Other long term (current) drug therapy: Secondary | ICD-10-CM | POA: Diagnosis not present

## 2014-12-22 HISTORY — DX: Fatty (change of) liver, not elsewhere classified: K76.0

## 2014-12-22 HISTORY — DX: Polyneuropathy, unspecified: G62.9

## 2014-12-22 HISTORY — DX: Anxiety disorder, unspecified: F41.9

## 2014-12-22 HISTORY — DX: Depression, unspecified: F32.A

## 2014-12-22 HISTORY — DX: Unspecified osteoarthritis, unspecified site: M19.90

## 2014-12-22 HISTORY — PX: AMPUTATION: SHX166

## 2014-12-22 HISTORY — DX: Major depressive disorder, single episode, unspecified: F32.9

## 2014-12-22 LAB — BASIC METABOLIC PANEL
Anion gap: 13 (ref 5–15)
BUN: 14 mg/dL (ref 6–20)
CHLORIDE: 104 mmol/L (ref 101–111)
CO2: 22 mmol/L (ref 22–32)
Calcium: 10.2 mg/dL (ref 8.9–10.3)
Creatinine, Ser: 0.62 mg/dL (ref 0.44–1.00)
GFR calc Af Amer: >60 mL/min (ref >60)
GFR calc non Af Amer: >60 mL/min (ref >60)
Glucose, Bld: 91 mg/dL (ref 65–99)
POTASSIUM: 4.1 mmol/L (ref 3.5–5.1)
SODIUM: 139 mmol/L (ref 135–145)

## 2014-12-22 LAB — CBC
HEMATOCRIT: 39.7 % (ref 36.0–46.0)
Hemoglobin: 12.9 g/dL (ref 12.0–15.0)
MCH: 30.9 pg (ref 26.0–34.0)
MCHC: 32.5 g/dL (ref 30.0–36.0)
MCV: 95 fL (ref 78.0–100.0)
Platelets: 244 10*3/uL (ref 150–400)
RBC: 4.18 MIL/uL (ref 3.87–5.11)
RDW: 13.1 % (ref 11.5–15.5)
WBC: 7.1 10*3/uL (ref 4.0–10.5)

## 2014-12-22 LAB — GLUCOSE, CAPILLARY
GLUCOSE-CAPILLARY: 84 mg/dL (ref 65–99)
Glucose-Capillary: 93 mg/dL (ref 65–99)
Glucose-Capillary: 111 mg/dL — ABNORMAL HIGH (ref 65–99)

## 2014-12-22 SURGERY — AMPUTATION, FOOT, RAY
Anesthesia: General | Site: Toe | Laterality: Right

## 2014-12-22 MED ORDER — ONDANSETRON HCL 4 MG/2ML IJ SOLN
INTRAMUSCULAR | Status: AC
  Filled 2014-12-22: qty 2

## 2014-12-22 MED ORDER — DOCUSATE SODIUM 100 MG PO CAPS: 100.0000 mg | ORAL_CAPSULE | Freq: Two times a day (BID) | ORAL | Status: DC

## 2014-12-22 MED ORDER — VANCOMYCIN HCL 500 MG IV SOLR
INTRAVENOUS | Status: AC
  Filled 2014-12-22: qty 500

## 2014-12-22 MED ORDER — EPHEDRINE SULFATE 50 MG/ML IJ SOLN
INTRAMUSCULAR | Status: DC | PRN
  Administered 2014-12-22: 5 mg via INTRAVENOUS

## 2014-12-22 MED ORDER — MIDAZOLAM HCL 5 MG/5ML IJ SOLN
INTRAMUSCULAR | Status: DC | PRN
  Administered 2014-12-22: 2 mg via INTRAVENOUS

## 2014-12-22 MED ORDER — FENTANYL CITRATE (PF) 250 MCG/5ML IJ SOLN
INTRAMUSCULAR | Status: AC
Start: 2014-12-22 — End: 2014-12-22
  Filled 2014-12-22: qty 5

## 2014-12-22 MED ORDER — CHLORHEXIDINE GLUCONATE 4 % EX LIQD: 60.0000 mL | Freq: Once | CUTANEOUS | Status: DC

## 2014-12-22 MED ORDER — TRAMADOL HCL 50 MG PO TABS: 50.0000 mg | ORAL_TABLET | Freq: Four times a day (QID) | ORAL | Status: DC | PRN

## 2014-12-22 MED ORDER — MEPERIDINE HCL 25 MG/ML IJ SOLN: 6.2500 mg | INTRAMUSCULAR | Status: DC | PRN

## 2014-12-22 MED ORDER — SENNA 8.6 MG PO TABS: 2.0000 | ORAL_TABLET | Freq: Two times a day (BID) | ORAL | Status: DC

## 2014-12-22 MED ORDER — HYDROMORPHONE HCL 1 MG/ML IJ SOLN: 0.2500 mg | INTRAMUSCULAR | Status: DC | PRN

## 2014-12-22 MED ORDER — FENTANYL CITRATE (PF) 100 MCG/2ML IJ SOLN
INTRAMUSCULAR | Status: DC | PRN
  Administered 2014-12-22: 50 ug via INTRAVENOUS

## 2014-12-22 MED ORDER — MIDAZOLAM HCL 2 MG/2ML IJ SOLN
INTRAMUSCULAR | Status: AC
  Filled 2014-12-22: qty 4

## 2014-12-22 MED ORDER — PROPOFOL 10 MG/ML IV BOLUS
INTRAVENOUS | Status: DC | PRN
  Administered 2014-12-22: 160 mg via INTRAVENOUS

## 2014-12-22 MED ORDER — LACTATED RINGERS IV SOLN
INTRAVENOUS | Status: DC
  Administered 2014-12-22: 12:00:00 via INTRAVENOUS

## 2014-12-22 MED ORDER — 0.9 % SODIUM CHLORIDE (POUR BTL) OPTIME
TOPICAL | Status: DC | PRN
  Administered 2014-12-22: 1000 mL

## 2014-12-22 MED ORDER — ONDANSETRON HCL 4 MG/2ML IJ SOLN
INTRAMUSCULAR | Status: DC | PRN
  Administered 2014-12-22: 4 mg via INTRAVENOUS

## 2014-12-22 MED ORDER — VANCOMYCIN HCL 500 MG IV SOLR
INTRAVENOUS | Status: DC | PRN
  Administered 2014-12-22: 500 mg via TOPICAL

## 2014-12-22 MED ORDER — SODIUM CHLORIDE 0.9 % IV SOLN: INTRAVENOUS | Status: DC

## 2014-12-22 MED ORDER — LACTATED RINGERS IV SOLN
INTRAVENOUS | Status: DC | PRN
  Administered 2014-12-22: 12:00:00 via INTRAVENOUS

## 2014-12-22 MED ORDER — LIDOCAINE HCL (CARDIAC) 20 MG/ML IV SOLN
INTRAVENOUS | Status: DC | PRN
  Administered 2014-12-22: 100 mg via INTRAVENOUS

## 2014-12-22 MED ORDER — ONDANSETRON HCL 4 MG/2ML IJ SOLN: 4.0000 mg | Freq: Once | INTRAMUSCULAR | Status: DC | PRN

## 2014-12-22 MED ORDER — ROCURONIUM BROMIDE 50 MG/5ML IV SOLN
INTRAVENOUS | Status: AC
  Filled 2014-12-22: qty 1

## 2014-12-22 MED ORDER — PROPOFOL 10 MG/ML IV BOLUS
INTRAVENOUS | Status: AC
  Filled 2014-12-22: qty 20

## 2014-12-22 MED ORDER — SODIUM CHLORIDE 0.9 % IJ SOLN
INTRAMUSCULAR | Status: AC
  Filled 2014-12-22: qty 10

## 2014-12-22 MED ORDER — LIDOCAINE HCL (CARDIAC) 20 MG/ML IV SOLN
INTRAVENOUS | Status: AC
  Filled 2014-12-22: qty 5

## 2014-12-22 MED ORDER — EPHEDRINE SULFATE 50 MG/ML IJ SOLN
INTRAMUSCULAR | Status: AC
  Filled 2014-12-22: qty 1

## 2014-12-22 SURGICAL SUPPLY — 47 items
BLADE LONG MED 31X9 (MISCELLANEOUS) ×2 IMPLANT
BNDG CMPR 9X4 STRL LF SNTH (GAUZE/BANDAGES/DRESSINGS) ×1
BNDG COHESIVE 4X5 TAN STRL (GAUZE/BANDAGES/DRESSINGS) ×2 IMPLANT
BNDG COHESIVE 6X5 TAN STRL LF (GAUZE/BANDAGES/DRESSINGS) ×2 IMPLANT
BNDG ESMARK 4X9 LF (GAUZE/BANDAGES/DRESSINGS) ×2 IMPLANT
BNDG GAUZE ELAST 4 BULKY (GAUZE/BANDAGES/DRESSINGS) ×1 IMPLANT
CANISTER SUCT 3000ML PPV (MISCELLANEOUS) ×2 IMPLANT
CHLORAPREP W/TINT 26ML (MISCELLANEOUS) ×2 IMPLANT
CUFF TOURNIQUET SINGLE 34IN LL (TOURNIQUET CUFF) IMPLANT
CUFF TOURNIQUET SINGLE 44IN (TOURNIQUET CUFF) IMPLANT
DRAPE U-SHAPE 47X51 STRL (DRAPES) ×4 IMPLANT
DRSG ADAPTIC 3X8 NADH LF (GAUZE/BANDAGES/DRESSINGS) ×2 IMPLANT
DRSG PAD ABDOMINAL 8X10 ST (GAUZE/BANDAGES/DRESSINGS) ×1 IMPLANT
ELECT REM PT RETURN 9FT ADLT (ELECTROSURGICAL) ×2
ELECTRODE REM PT RTRN 9FT ADLT (ELECTROSURGICAL) ×1 IMPLANT
GAUZE SPONGE 4X4 12PLY STRL (GAUZE/BANDAGES/DRESSINGS) ×2 IMPLANT
GLOVE BIO SURGEON STRL SZ8 (GLOVE) ×4 IMPLANT
GLOVE BIOGEL PI IND STRL 8 (GLOVE) ×2 IMPLANT
GLOVE BIOGEL PI IND STRL 8.5 (GLOVE) IMPLANT
GLOVE BIOGEL PI INDICATOR 8 (GLOVE) ×2
GLOVE BIOGEL PI INDICATOR 8.5 (GLOVE) ×1
GLOVE ECLIPSE 7.5 STRL STRAW (GLOVE) ×2 IMPLANT
GLOVE SURG SS PI 8.5 STRL IVOR (GLOVE) ×1
GLOVE SURG SS PI 8.5 STRL STRW (GLOVE) IMPLANT
GOWN STRL REUS W/ TWL LRG LVL3 (GOWN DISPOSABLE) ×1 IMPLANT
GOWN STRL REUS W/ TWL XL LVL3 (GOWN DISPOSABLE) ×2 IMPLANT
GOWN STRL REUS W/TWL LRG LVL3 (GOWN DISPOSABLE) ×4
GOWN STRL REUS W/TWL XL LVL3 (GOWN DISPOSABLE) ×4
KIT BASIN OR (CUSTOM PROCEDURE TRAY) ×2 IMPLANT
KIT ROOM TURNOVER OR (KITS) ×2 IMPLANT
NS IRRIG 1000ML POUR BTL (IV SOLUTION) ×2 IMPLANT
PACK ORTHO EXTREMITY (CUSTOM PROCEDURE TRAY) ×2 IMPLANT
PAD ARMBOARD 7.5X6 YLW CONV (MISCELLANEOUS) ×4 IMPLANT
PAD CAST 4YDX4 CTTN HI CHSV (CAST SUPPLIES) ×1 IMPLANT
PADDING CAST COTTON 4X4 STRL (CAST SUPPLIES) ×2
SPECIMEN JAR SMALL (MISCELLANEOUS) ×2 IMPLANT
SPONGE GAUZE 4X4 12PLY STER LF (GAUZE/BANDAGES/DRESSINGS) ×1 IMPLANT
SPONGE LAP 18X18 X RAY DECT (DISPOSABLE) ×2 IMPLANT
STAPLER VISISTAT 35W (STAPLE) IMPLANT
STOCKINETTE IMPERVIOUS LG (DRAPES) IMPLANT
SUCTION FRAZIER TIP 10 FR DISP (SUCTIONS) ×2 IMPLANT
SUT ETHILON 2 0 PSLX (SUTURE) IMPLANT
TOWEL OR 17X24 6PK STRL BLUE (TOWEL DISPOSABLE) ×2 IMPLANT
TOWEL OR 17X26 10 PK STRL BLUE (TOWEL DISPOSABLE) ×2 IMPLANT
TUBE CONNECTING 12X1/4 (SUCTIONS) ×2 IMPLANT
UNDERPAD 30X30 INCONTINENT (UNDERPADS AND DIAPERS) ×2 IMPLANT
WATER STERILE IRR 1000ML POUR (IV SOLUTION) ×2 IMPLANT

## 2014-12-22 NOTE — Discharge Instructions (Signed)
[name] Simmer, MD [place]  Please read the following information regarding your care after surgery.  Medications  You only need a prescription for the narcotic pain medicine (ex. oxycodone, Percocet, Norco).  All of the other medicines listed below are available over the counter. X acetominophen (Tylenol) 650 mg every 4-6 hours as you need for minor pain X tramadol as prescribed for moderate to severe pain ?   Narcotic pain medicine (ex. oxycodone, Percocet, Vicodin) will cause constipation.  To prevent this problem, take the following medicines while you are taking any pain medicine. X docusate sodium (Colace) 100 mg twice a day X senna (Senokot) 2 tablets twice a day   Weight Bearing X Bear weight only on the heel of your operated foot in the post-op shoe.   Cast / Splint / Dressing X Keep your splint or cast clean and dry.  Dont put anything (coat hanger, pencil, etc) down inside of it.  If it gets damp, use a hair dryer on the cool setting to dry it.  If it gets soaked, call the office to schedule an appointment for a cast change.   After your dressing, cast or splint is removed; you may shower, but do not soak or scrub the wound.  Allow the water to run over it, and then gently pat it dry.  Swelling It is normal for you to have swelling where you had surgery.  To reduce swelling and pain, keep your toes above your nose for at least 3 days after surgery.  It may be necessary to keep your foot or leg elevated for several weeks.  If it hurts, it should be elevated.  Follow Up Call my office at [phone] when you are discharged from the hospital or surgery center to schedule an appointment to be seen two weeks after surgery.  Call my office at [phone] if you develop a fever >101.5 F, nausea, vomiting, bleeding from the surgical site or severe pain.

## 2014-12-22 NOTE — Anesthesia Preprocedure Evaluation (Addendum)
Anesthesia Evaluation  Patient identified by MRN, date of birth, ID band Patient awake    Reviewed: Allergy & Precautions, NPO status , Patient's Chart, lab work & pertinent test results  History of Anesthesia Complications Negative for: history of anesthetic complications  Airway Mallampati: I  TM Distance: >3 FB Neck ROM: Full    Dental  (+) Teeth Intact   Pulmonary sleep apnea and Continuous Positive Airway Pressure Ventilation , former smoker,    Pulmonary exam normal breath sounds clear to auscultation       Cardiovascular hypertension, Pt. on medications + CAD  Normal cardiovascular exam Rhythm:Regular     Neuro/Psych PSYCHIATRIC DISORDERS Anxiety Depression negative neurological ROS     GI/Hepatic negative GI ROS, Neg liver ROS,   Endo/Other  diabetes, Type 2, Insulin DependentHypothyroidism Morbid obesityInsulin pump  Renal/GU      Musculoskeletal   Abdominal   Peds  Hematology   Anesthesia Other Findings   Reproductive/Obstetrics negative OB ROS                            Anesthesia Physical Anesthesia Plan  ASA: II  Anesthesia Plan: General   Post-op Pain Management:    Induction: Intravenous  Airway Management Planned: LMA  Additional Equipment:   Intra-op Plan:   Post-operative Plan: Extubation in OR  Informed Consent: I have reviewed the patients History and Physical, chart, labs and discussed the procedure including the risks, benefits and alternatives for the proposed anesthesia with the patient or authorized representative who has indicated his/her understanding and acceptance.     Plan Discussed with: CRNA and Surgeon  Anesthesia Plan Comments:         Anesthesia Quick Evaluation

## 2014-12-22 NOTE — Anesthesia Procedure Notes (Signed)
Procedure Name: LMA Insertion Date/Time: 12/22/2014 1:18 PM Performed by: Luciana Axe K Pre-anesthesia Checklist: Patient identified, Emergency Drugs available, Suction available, Patient being monitored and Timeout performed Patient Re-evaluated:Patient Re-evaluated prior to inductionOxygen Delivery Method: Circle system utilized Preoxygenation: Pre-oxygenation with 100% oxygen Intubation Type: IV induction LMA: LMA inserted LMA Size: 4.0 Number of attempts: 1 Placement Confirmation: positive ETCO2,  CO2 detector and breath sounds checked- equal and bilateral Tube secured with: Tape Dental Injury: Teeth and Oropharynx as per pre-operative assessment

## 2014-12-22 NOTE — Progress Notes (Signed)
Patient turned insulin pump off after initial CBG. Dr. Conrad Montegut notified that patient had turned Insulin pump off and  About initial and follow- up CBG after the follow- up CBG was obtained. Patient states she needs to clarify consent wording before she signs consent as she was told it was only going to be a partial amputation. Patient has a ear ring that she cannot remove. Patient reports that she only wants to use her personal CPAP notified [employer] to come check machine.

## 2014-12-22 NOTE — Anesthesia Postprocedure Evaluation (Signed)
Anesthesia Post Note  Patient: [name]  Procedure(s) Performed: Procedure(s) (LRB): RIGHT 2ND TOE AMPUTATION (Right)  Anesthesia type: MAC  Patient location: PACU  Post pain: Pain level controlled  Post assessment: Patient's Cardiovascular Status Stable  Last Vitals:  Filed Vitals:   12/22/14 1512  BP:   Pulse:   Temp: 36.6 C  Resp:     Post vital signs: Reviewed and stable  Level of consciousness: sedated  Complications: No apparent anesthesia complications

## 2014-12-22 NOTE — Brief Op Note (Signed)
12/22/2014  2:03 PM  PATIENT:  [name]  59 y.o. female  PRE-OPERATIVE DIAGNOSIS:  RIGHT 2ND TOE OSTEOMYLITIS  POST-OPERATIVE DIAGNOSIS:  RIGHT 2ND TOE OSTEOMYLITIS  Procedure(s): RIGHT 2ND TOE AMPUTATION at the P IP joint  SURGEON:  [name], MD  ASSISTANT: Mechele Claude, PA-C  ANESTHESIA:   General  EBL:  minimal   TOURNIQUET:  11 min with ankle esmarch  COMPLICATIONS:  None apparent  DISPOSITION:  Extubated, awake and stable to recovery.  DICTATION ID:  [id number]

## 2014-12-22 NOTE — Transfer of Care (Signed)
Immediate Anesthesia Transfer of Care Note  Patient: [name]  Procedure(s) Performed: Procedure(s): RIGHT 2ND TOE AMPUTATION (Right)  Patient Location: PACU  Anesthesia Type:General  Level of Consciousness: awake, oriented and patient cooperative  Airway & Oxygen Therapy: Patient Spontanous Breathing and Patient connected to nasal cannula oxygen  Post-op Assessment: Report given to RN and Post -op Vital signs reviewed and stable  Post vital signs: Reviewed  Last Vitals:  Filed Vitals:   12/22/14 1133  BP: 125/59  Pulse: 71  Temp: 36.6 C  Resp: 16    Complications: No apparent anesthesia complications

## 2014-12-22 NOTE — H&P (Signed)
[name] is an 59 y.o. female.   Chief Complaint:  Right 2nd toe ulcer HPI: 59 y/o female with PMH of diabetes and smoking presents with a  right 2nd toe ulcer and osteomyelitis.  She present today for amputation of the right 2nd toe.  She's on oral abx.  Past Medical History  Diagnosis Date  . Type 2 diabetes mellitus (HCC)     insulin pump  . Hypertension   . OSA on CPAP     AHI = 43 (per patient)  . History of nuclear stress test 12/16/2011    exercise myoview; normal images with 2-95m ST-segment depression - subsequent cath revelaed subtotally occluded small 2nd marginal branch & 75% PDA lesion, normal LV function  . Hypothyroidism   . Coronary artery disease   . Tobacco abuse   . Depression   . Anxiety   . Kidney stones   . [place]     in [place]  . Arthritis     knees  . Fatty liver     Past Surgical History  Procedure Laterality Date  . Cardiac catheterization  01/04/2012    subtotally occluded small 2nd marginal branch & 75% PDA lesion, normal LV function  . Cholecystectomy  2008  . Cataract extraction Bilateral     with lens implants  . Breast enhancement surgery Bilateral   . Left heart catheterization with coronary angiogram N/A 01/04/2012    Procedure: LEFT HEART CATHETERIZATION WITH CORONARY ANGIOGRAM;  Surgeon: JLorretta Harp MD;  [facility];  Service: Cardiovascular;  Laterality: N/A;  . Colonoscopy    . Posterior cervical fusion/foraminotomy      Family History  Problem Relation Age of Onset  . Stroke Mother   . Hypertension Mother   . Diabetes Mother   . Alzheimer's disease Mother   . Diabetes Father   . COPD Father     vent-dependent, MODS  . Hypertension Sister   . Mental illness Sister     borderline personality d/o  . Mental illness Sister     schizoeffective d/o  . Diabetes Sister    Social History:  reports that she quit smoking 7 days ago. Her smoking use included Cigarettes. She has a 25 pack-year smoking  history. She has never used smokeless tobacco. She reports that she does not drink alcohol or use illicit drugs.  Allergies:  Allergies  Allergen Reactions  . Penicillins Other (See Comments)    Unknown childhood reaction (maybe hives)    Medications Prior to Admission  Medication Sig Dispense Refill  . amoxicillin-clavulanate (AUGMENTIN) 875-125 MG tablet Take 1 tablet by mouth 2 (two) times daily. 14 day course started 12/08/14, another 14 day course filled 12/19/14    . atorvastatin (LIPITOR) 20 MG tablet Take 20 mg by mouth daily.    .Marland KitchenbuPROPion (WELLBUTRIN SR) 200 MG 12 hr tablet Take 200 mg by mouth daily.     .Marland Kitchenescitalopram (LEXAPRO) 20 MG tablet Take 40 mg by mouth daily.    .Marland Kitchengabapentin (NEURONTIN) 300 MG capsule Take 300 mg by mouth 3 (three) times daily.    . Insulin Human (INSULIN PUMP) SOLN Inject into the skin continuous. Humalog - basal rate 2.75    . levothyroxine (SYNTHROID, LEVOTHROID) 112 MCG tablet Take 112 mcg by mouth daily before breakfast.    . valsartan-hydrochlorothiazide (DIOVAN-HCT) 80-12.5 MG per tablet Take 1 tablet by mouth daily.    . nicotine (NICODERM CQ - DOSED IN MG/24 HOURS) 14 mg/24hr  patch Place 1 patch (14 mg total) onto the skin daily. 28 patch 6    Results for orders placed or performed during the hospital encounter of 01/10/2015 (from the past 48 hour(s))  Glucose, capillary     Status: None   Collection Time: 01-10-15 11:39 AM  Result Value Ref Range   Glucose-Capillary 84 65 - 99 mg/dL  Basic metabolic panel     Status: None   Collection Time: 2015/01/10 12:15 PM  Result Value Ref Range   Sodium 139 135 - 145 mmol/L   Potassium 4.1 3.5 - 5.1 mmol/L   Chloride 104 101 - 111 mmol/L   CO2 22 22 - 32 mmol/L   Glucose, Bld 91 65 - 99 mg/dL   BUN 14 6 - 20 mg/dL   Creatinine, Ser 0.62 0.44 - 1.00 mg/dL   Calcium 10.2 8.9 - 10.3 mg/dL   GFR calc non Af Amer >60 >60 mL/min   GFR calc Af Amer >60 >60 mL/min    Comment: (NOTE) The eGFR has been  calculated using the CKD EPI equation. This calculation has not been validated in all clinical situations. eGFR's persistently <60 mL/min signify possible Chronic Kidney Disease.    Anion gap 13 5 - 15  CBC     Status: None   Collection Time: 2015-01-10 12:15 PM  Result Value Ref Range   WBC 7.1 4.0 - 10.5 K/uL   RBC 4.18 3.87 - 5.11 MIL/uL   Hemoglobin 12.9 12.0 - 15.0 g/dL   HCT 39.7 36.0 - 46.0 %   MCV 95.0 78.0 - 100.0 fL   MCH 30.9 26.0 - 34.0 pg   MCHC 32.5 30.0 - 36.0 g/dL   RDW 13.1 11.5 - 15.5 %   Platelets 244 150 - 400 K/uL  Glucose, capillary     Status: None   Collection Time: 2015/01/10 12:40 PM  Result Value Ref Range   Glucose-Capillary 93 65 - 99 mg/dL   No results found.  ROS  No recent f/c/n/v/wt loss  Blood pressure 125/59, pulse 71, temperature 97.8 F (36.6 C), temperature source Oral, resp. rate 16, height 5' 7"  (1.702 m), weight 106.142 kg (234 lb), SpO2 98 %. Physical Exam  wn wd woman in nad.  A and O x 4.  Mood and affect normal.  EOMI.  resp unlabored.  R 2nd toe with ulcer and exposed distal phalanx.  No lymphadenopathy.  Moderate swelling.  5/5 strength in PF and DF of the ankle.  Sens to LT diminished at the forefoot.  Assessment/Plan R 2nd toe ulcer and osteomyelitis - to OR for right 2nd toe amputation.  The risks and benefits of the alternative treatment options have been discussed in detail.  The patient wishes to proceed with surgery and specifically understands risks of bleeding, infection, nerve damage, blood clots, need for additional surgery, amputation and death.   [name] 01-10-15, 1:11 PM

## 2014-12-23 ENCOUNTER — Encounter (HOSPITAL_COMMUNITY): Payer: Self-pay | Admitting: Orthopedic Surgery

## 2014-12-23 NOTE — Op Note (Signed)
NAME:  [name], [name] NO.:  [id number]  MEDICAL RECORD NO.:  [id number]  LOCATION:  [facility]                         FACILITY:  [place]  PHYSICIAN:  [name], MD        DATE OF BIRTH:  [birth year]  DATE OF PROCEDURE:  12/22/2014 DATE OF DISCHARGE:  12/22/2014                              OPERATIVE REPORT   PREOPERATIVE DIAGNOSIS:  Right second toe diabetic ulcer and osteomyelitis.  POSTOPERATIVE DIAGNOSIS:  Right second toe diabetic ulcer and osteomyelitis.  PROCEDURE:  Right second toe amputation at the proximal interphalangeal joint.  SURGEON:  [name], M.D.  ASSISTANT:  Mechele Claude, PA-C.  ANESTHESIA:  General.  ESTIMATED BLOOD LOSS:  Minimal.  TOURNIQUET TIME:  11 minutes with an ankle Esmarch.  COMPLICATIONS:  None apparent.  DISPOSITION:  Extubated, awake, and stable to recovery.  INDICATIONS FOR PROCEDURE:  The patient is a 59 year old female with past medical history significant for smoking and diabetes.  She developed an ulcer at the tip of her second toe followed by swelling and erythema of the forefoot.  She has been treated now with a couple of weeks of oral Augmentin.  Her cellulitis symptoms have resolved, but an MRI shows osteomyelitis of the distal and middle phalanges of the right foot second toe.  She presents now for amputation of the right foot second toe.  She understands the risks and benefits, the alternative treatment options, and elects surgical treatment.  She specifically understands risks of bleeding, infection, nerve damage, blood clots, need for additional surgery, continued pain, recurrence of her ulcer, revision amputation, and death.  PROCEDURE IN DETAIL:  After preoperative consent was obtained and the correct operative site was identified, the patient was brought to the operating room and placed supine on the operating table.  General anesthesia was induced.  Preoperative antibiotics were  administered. Surgical time-out was taken.  The right lower extremity was prepped and draped in standard sterile fashion.  The foot was exsanguinated and an Esmarch tourniquet was wrapped about the ankle.  The right foot second toe ulcer was identified.  A fishmouth incision was marked on the skin proximal to the area of chronically inflamed tissue.  The incision was made and sharp dissection was carried down through the skin and subcutaneous tissue.  The toe was disarticulated through the PIP joint and passed off the field.  A specimen of deep tissue from the area of the ulcer was obtained and sent to Microbiology for aerobic and anaerobic cultures.  The head of the proximal phalanx was then resected with a bone cutter.  The wound was irrigated copiously.  Neurovascular bundles were cauterized.  Vancomycin powder was sprinkled in the wound and the wound was closed with 2-0 nylon simple sutures.  Sterile dressings were applied followed by compression wrap.  Tourniquet was released at 11 minutes after application of the dressings.  The patient was awakened from anesthesia and transported to the recovery room in stable condition.  IV vancomycin was administered once the cultures were obtained.  FOLLOWUP PLAN:  The patient will be weightbearing as tolerated on her right foot in a flat postop shoe.  She will follow up with me in the  office in 10 to 14 days for suture removal.  She will continue her course of oral Augmentin until it has been completed.     [name], MD     JH/MEDQ  D:  12/22/2014  T:  12/23/2014  Job:  [id number]

## 2014-12-24 LAB — WOUND CULTURE: GRAM STAIN: NONE SEEN

## 2014-12-27 LAB — ANAEROBIC CULTURE: Gram Stain: NONE SEEN

## 2015-01-14 ENCOUNTER — Ambulatory Visit: Payer: 59 | Admitting: Gynecologic Oncology

## 2015-01-14 ENCOUNTER — Telehealth: Payer: Self-pay | Admitting: Gynecologic Oncology

## 2015-01-14 NOTE — Telephone Encounter (Signed)
Called patient to discuss her missed preop appointment. Unfortunately we have no time in the clinic to see her for preop before her scheduled surgery date (December 6th). Therefore we will reschedule her surgery either with my partner, Dr Janie Morning on December 15th, or with me on December 27th.   [name] Eva, MD

## 2015-01-19 ENCOUNTER — Telehealth: Payer: Self-pay | Admitting: Cardiovascular Disease

## 2015-01-19 NOTE — Telephone Encounter (Signed)
Received a call from pharmacist Dian Situ at [facility] calling to verify patient's synthroid dose.Advised at 11/27/14 office visit with Angelena Form PA patient was taking levothyroxine 100 mcg daily.

## 2015-01-20 ENCOUNTER — Telehealth: Payer: Self-pay | Admitting: Gynecologic Oncology

## 2015-01-20 NOTE — Telephone Encounter (Signed)
Called to touch base with the patient about upcoming surgery.  She spoke with Dr. Denman George last Friday about missing her pre-op appointment and the need to move her surgery since she would not be able to see Dr. Denman George in the office before that time.  Patient advised on that day that Dr. Serita Grit next opening would be Dec 27.  Spoke with the patient today about opening with Dr. Skeet Latch on Dec 15 but she would like to stay with Dr. Denman George for surgery on Dec 27.  Pre-op appt with Denman George arranged for Dec 19.  Patient verbalizing understanding.  Advised to call for any needs.

## 2015-01-21 ENCOUNTER — Encounter (HOSPITAL_COMMUNITY): Payer: Self-pay

## 2015-01-21 ENCOUNTER — Encounter (HOSPITAL_COMMUNITY)
Admission: RE | Admit: 2015-01-21 | Discharge: 2015-01-21 | Disposition: A | Discharge status: Home / Self Care | Payer: 59 | Source: Ambulatory Visit | Attending: Gynecologic Oncology | Admitting: Gynecologic Oncology

## 2015-01-21 DIAGNOSIS — Z01818 Encounter for other preprocedural examination: Secondary | ICD-10-CM | POA: Insufficient documentation

## 2015-01-21 DIAGNOSIS — N83201 Unspecified ovarian cyst, right side: Secondary | ICD-10-CM | POA: Diagnosis not present

## 2015-01-21 DIAGNOSIS — N83202 Unspecified ovarian cyst, left side: Secondary | ICD-10-CM | POA: Diagnosis not present

## 2015-01-21 HISTORY — DX: Personal history of urinary calculi: Z87.442

## 2015-01-21 LAB — COMPREHENSIVE METABOLIC PANEL
ALK PHOS: 105 U/L (ref 38–126)
ALT: 12 U/L — AB (ref 14–54)
ANION GAP: 5 (ref 5–15)
AST: 14 U/L — ABNORMAL LOW (ref 15–41)
Albumin: 3.8 g/dL (ref 3.5–5.0)
BILIRUBIN TOTAL: 0.6 mg/dL (ref 0.3–1.2)
BUN: 16 mg/dL (ref 6–20)
CALCIUM: 9.7 mg/dL (ref 8.9–10.3)
CO2: 27 mmol/L (ref 22–32)
CREATININE: 0.68 mg/dL (ref 0.44–1.00)
Chloride: 103 mmol/L (ref 101–111)
GFR calc non Af Amer: >60 mL/min (ref >60)
GLUCOSE: 214 mg/dL — AB (ref 65–99)
Potassium: 4.9 mmol/L (ref 3.5–5.1)
SODIUM: 135 mmol/L (ref 135–145)
TOTAL PROTEIN: 8 g/dL (ref 6.5–8.1)

## 2015-01-21 LAB — CBC WITH DIFFERENTIAL/PLATELET
Basophils Absolute: 0 10*3/uL (ref 0.0–0.1)
Basophils Relative: 0 %
EOS ABS: 0.3 10*3/uL (ref 0.0–0.7)
Eosinophils Relative: 3 %
HEMATOCRIT: 39.5 % (ref 36.0–46.0)
HEMOGLOBIN: 13.2 g/dL (ref 12.0–15.0)
LYMPHS ABS: 2.9 10*3/uL (ref 0.7–4.0)
LYMPHS PCT: 33 %
MCH: 31.1 pg (ref 26.0–34.0)
MCHC: 33.4 g/dL (ref 30.0–36.0)
MCV: 92.9 fL (ref 78.0–100.0)
MONOS PCT: 5 %
Monocytes Absolute: 0.4 10*3/uL (ref 0.1–1.0)
NEUTROS ABS: 5.3 10*3/uL (ref 1.7–7.7)
NEUTROS PCT: 59 %
Platelets: 245 10*3/uL (ref 150–400)
RBC: 4.25 MIL/uL (ref 3.87–5.11)
RDW: 12.9 % (ref 11.5–15.5)
WBC: 8.9 10*3/uL (ref 4.0–10.5)

## 2015-01-21 LAB — URINALYSIS, ROUTINE W REFLEX MICROSCOPIC
BILIRUBIN URINE: NEGATIVE
Glucose, UA: NEGATIVE mg/dL
Hgb urine dipstick: NEGATIVE
Ketones, ur: NEGATIVE mg/dL
Leukocytes, UA: NEGATIVE
Nitrite: NEGATIVE
PROTEIN: NEGATIVE mg/dL
SPECIFIC GRAVITY, URINE: 1.015 (ref 1.005–1.030)
pH: 6.5 (ref 5.0–8.0)

## 2015-01-21 NOTE — Patient Instructions (Addendum)
[place]  01/21/2015   Your procedure is scheduled on:   02-17-2015 Tuesday  Enter through [facility]  Entrance and follow signs to [facility] to [place]. Arrive at     0530   AM.  (Limit 1 person with you).  Call this number if you have problems the morning of surgery: [phone]  Or Presurgical Testing [phone] days before.   For Living Will and/or Health Care Power Attorney Forms: please provide copy for your medical record,may bring AM of surgery(Forms should be already notarized -we do not provide this service.)No information preferred today).  Remember: Follow any bowel prep instructions per MD office. For Cpap use: Bring mask and tubing only.   Do not eat food/ or drink: After Midnight.      Take these medicines the morning of surgery with A SIP OF WATER-   (DO NOT TAKE ANY DIABETIC MEDS AM OF SURGERY) : Atorvastatin. Wellbutrin. Gabapentin. Escitalopram. Levothyroxine. Bring any supplies associated with Insulin pump Use- consult your Endocrinologist as needed.   Do not wear jewelry, make-up or nail polish.  Do not wear deodorant, lotions, powders, or perfumes.   Do not shave legs and under arms- 48 hours(2 days) prior to first CHG shower.(Shaving face and neck okay.)  Do not bring valuables to the hospital.(Hospital is not responsible for lost valuables).  Contacts, dentures or removable bridgework, body piercing, hair pins may not be worn into surgery.  Leave suitcase in the car. After surgery it may be brought to your room.  For patients admitted to the hospital, checkout time is 11:00 AM the day of discharge.(Restricted visitors-Any Persons displaying flu-like symptoms or illness).    Patients discharged the day of surgery will not be allowed to drive home. Must have responsible person with you x 24 hours once discharged.  Name and phone number of your driver: -Mikita Guenette, spouse -[phone] cell     Please read over the  following fact sheets that you were given:  CHG(Chlorhexidine Gluconate 4% Surgical Soap) use,  Blood Transfusion fact sheet, Incentive Spirometry Instruction.  Remember : Type/Screen "Blue armbands" - may not be removed once applied(would result in being retested AM of surgery, if removed).           [facility] - Preparing for Surgery Before surgery, you can play an important role.  Because skin is not sterile, your skin needs to be as free of germs as possible.  You can reduce the number of germs on your skin by washing with CHG (chlorahexidine gluconate) soap before surgery.  CHG is an antiseptic cleaner which kills germs and bonds with the skin to continue killing germs even after washing. Please DO NOT use if you have an allergy to CHG or antibacterial soaps.  If your skin becomes reddened/irritated stop using the CHG and inform your nurse when you arrive at Short Stay. Do not shave (including legs and underarms) for at least 48 hours prior to the first CHG shower.  You may shave your face/neck. Please follow these instructions carefully:  1.  Shower with CHG Soap the night before surgery and the  morning of Surgery.  2.  If you choose to wash your hair, wash your hair first as usual with your  normal  shampoo.  3.  After you shampoo, rinse your hair and body thoroughly to remove the  shampoo.  4.  Use CHG as you would any other liquid soap.  You can apply chg directly  to the skin and wash                       Gently with a scrungie or clean washcloth.  5.  Apply the CHG Soap to your body ONLY FROM THE NECK DOWN.   Do not use on face/ open                           Wound or open sores. Avoid contact with eyes, ears mouth and genitals (private parts).                       Wash face,  Genitals (private parts) with your normal soap.             6.  Wash thoroughly, paying special attention to the area where your surgery  will be performed.  7.  Thoroughly rinse  your body with warm water from the neck down.  8.  DO NOT shower/wash with your normal soap after using and rinsing off  the CHG Soap.                9.  Pat yourself dry with a clean towel.            10.  Wear clean pajamas.            11.  Place clean sheets on your bed the night of your first shower and do not  sleep with pets. Day of Surgery : Do not apply any lotions/deodorants the morning of surgery.  Please wear clean clothes to the hospital/surgery center.  FAILURE TO FOLLOW THESE INSTRUCTIONS MAY RESULT IN THE CANCELLATION OF YOUR SURGERY PATIENT SIGNATURE_________________________________  NURSE SIGNATURE__________________________________  ________________________________________________________________________   Adam Phenix  An incentive spirometer is a tool that can help keep your lungs clear and active. This tool measures how well you are filling your lungs with each breath. Taking long deep breaths may help reverse or decrease the chance of developing breathing (pulmonary) problems (especially infection) following:  A long period of time when you are unable to move or be active. BEFORE THE PROCEDURE   If the spirometer includes an indicator to show your best effort, your nurse or respiratory therapist will set it to a desired goal.  If possible, sit up straight or lean slightly forward. Try not to slouch.  Hold the incentive spirometer in an upright position. INSTRUCTIONS FOR USE   Sit on the edge of your bed if possible, or sit up as far as you can in bed or on a chair.  Hold the incentive spirometer in an upright position.  Breathe out normally.  Place the mouthpiece in your mouth and seal your lips tightly around it.  Breathe in slowly and as deeply as possible, raising the piston or the ball toward the top of the column.  Hold your breath for 3-5 seconds or for as long as possible. Allow the piston or ball to fall to the bottom of the  column.  Remove the mouthpiece from your mouth and breathe out normally.  Rest for a few seconds and repeat Steps 1 through 7 at least 10 times every 1-2 hours when you are awake. Take your time and take a few normal breaths between deep breaths.  The spirometer may include an indicator to  show your best effort. Use the indicator as a goal to work toward during each repetition.  After each set of 10 deep breaths, practice coughing to be sure your lungs are clear. If you have an incision (the cut made at the time of surgery), support your incision when coughing by placing a pillow or rolled up towels firmly against it. Once you are able to get out of bed, walk around indoors and cough well. You may stop using the incentive spirometer when instructed by your caregiver.  RISKS AND COMPLICATIONS  Take your time so you do not get dizzy or light-headed.  If you are in pain, you may need to take or ask for pain medication before doing incentive spirometry. It is harder to take a deep breath if you are having pain. AFTER USE  Rest and breathe slowly and easily.  It can be helpful to keep track of a log of your progress. Your caregiver can provide you with a simple table to help with this. If you are using the spirometer at home, follow these instructions: [place] IF:   You are having difficultly using the spirometer.  You have trouble using the spirometer as often as instructed.  Your pain medication is not giving enough relief while using the spirometer.  You develop fever of 100.5 F (38.1 C) or higher. SEEK IMMEDIATE MEDICAL CARE IF:   You cough up bloody sputum that had not been present before.  You develop fever of 102 F (38.9 C) or greater.  You develop worsening pain at or near the incision site. MAKE SURE YOU:   Understand these instructions.  Will watch your condition.  Will get help right away if you are not doing well or get worse. Document Released:  06/20/2006 Document Revised: 05/02/2011 Document Reviewed: 08/21/2006 ExitCare Patient Information 2014 ExitCare, [place].   ________________________________________________________________________  WHAT IS A BLOOD TRANSFUSION? Blood Transfusion Information  A transfusion is the replacement of blood or some of its parts. Blood is made up of multiple cells which provide different functions.  Red blood cells carry oxygen and are used for blood loss replacement.  White blood cells fight against infection.  Platelets control bleeding.  Plasma helps clot blood.  Other blood products are available for specialized needs, such as hemophilia or other clotting disorders. BEFORE THE TRANSFUSION  Who gives blood for transfusions?   Healthy volunteers who are fully evaluated to make sure their blood is safe. This is blood bank blood. Transfusion therapy is the safest it has ever been in the practice of medicine. Before blood is taken from a donor, a complete history is taken to make sure that person has no history of diseases nor engages in risky social behavior (examples are intravenous drug use or sexual activity with multiple partners). The donor's travel history is screened to minimize risk of transmitting infections, such as malaria. The donated blood is tested for signs of infectious diseases, such as HIV and hepatitis. The blood is then tested to be sure it is compatible with you in order to minimize the chance of a transfusion reaction. If you or a relative donates blood, this is often done in anticipation of surgery and is not appropriate for emergency situations. It takes many days to process the donated blood. RISKS AND COMPLICATIONS Although transfusion therapy is very safe and saves many lives, the main dangers of transfusion include:   Getting an infectious disease.  Developing a transfusion reaction. This is an allergic reaction to  something in the blood you were given. Every precaution  is taken to prevent this. The decision to have a blood transfusion has been considered carefully by your caregiver before blood is given. Blood is not given unless the benefits outweigh the risks. AFTER THE TRANSFUSION  Right after receiving a blood transfusion, you will usually feel much better and more energetic. This is especially true if your red blood cells have gotten low (anemic). The transfusion raises the level of the red blood cells which carry oxygen, and this usually causes an energy increase.  The nurse administering the transfusion will monitor you carefully for complications. HOME CARE INSTRUCTIONS  No special instructions are needed after a transfusion. You may find your energy is better. Speak with your caregiver about any limitations on activity for underlying diseases you may have. SEEK MEDICAL CARE IF:   Your condition is not improving after your transfusion.  You develop redness or irritation at the intravenous (IV) site. SEEK IMMEDIATE MEDICAL CARE IF:  Any of the following symptoms occur over the next 12 hours:  Shaking chills.  You have a temperature by mouth above 102 F (38.9 C), not controlled by medicine.  Chest, back, or muscle pain.  People around you feel you are not acting correctly or are confused.  Shortness of breath or difficulty breathing.  Dizziness and fainting.  You get a rash or develop hives.  You have a decrease in urine output.  Your urine turns a dark color or changes to pink, red, or brown. Any of the following symptoms occur over the next 10 days:  You have a temperature by mouth above 102 F (38.9 C), not controlled by medicine.  Shortness of breath.  Weakness after normal activity.  The white part of the eye turns yellow (jaundice).  You have a decrease in the amount of urine or are urinating less often.  Your urine turns a dark color or changes to pink, red, or brown. Document Released: 02/05/2000 Document Revised:  05/02/2011 Document Reviewed: 09/24/2007 [facility] Patient Information 2014 [place], [place].  _______________________________________________________________________

## 2015-01-21 NOTE — Pre-Procedure Instructions (Addendum)
EKG 11-27-14 Epic. 01-21-15 1155 Spoke with Trudie Reed, diabetes coordinator- made aware of pt's planned surgery. Pt. Aware to bring own Insulin pump supplies. Pt to make endocrinologist- Dr. Reynaldo Minium aware of planned surgery. Pt aware will be converted over to continuous Insulin drip upon arrival to Short Stay AM of surgery.

## 2015-01-22 LAB — HEMOGLOBIN A1C
HEMOGLOBIN A1C: 8.4 % — AB (ref 4.8–5.6)
Mean Plasma Glucose: 194 mg/dL

## 2015-01-22 NOTE — Progress Notes (Signed)
A1C results in epic per PAT visit 01/21/2015 sent to Dr Denman George

## 2015-02-03 ENCOUNTER — Telehealth: Payer: Self-pay | Admitting: *Deleted

## 2015-02-03 NOTE — Telephone Encounter (Signed)
Call pt with A1C results 8.4. Unable to reach.  Called mobile# [email or web address] A1C results.  Results will be reviewed with MD to see if surgery needs to be postponed.

## 2015-02-04 ENCOUNTER — Telehealth: Payer: Self-pay | Admitting: Gynecologic Oncology

## 2015-02-04 NOTE — Telephone Encounter (Signed)
Left message for patient informing patient that Dr. Denman George had reviewed her lab work and states she can proceed with surgery as planned on Dec 27.  Advised to call for any questions or concerns.

## 2015-02-04 NOTE — Progress Notes (Signed)
Per Melissa in gyn/onc, another set pre op labs not needed if this is acceptable with anesthesia. Per anesthesia guidelines, labs are good for 30 days, and fingerstick blood sugar will be done am of OR.  I spoke with Melissa, and this is ok, and type and screen will be drawn am of or

## 2015-02-09 ENCOUNTER — Encounter: Payer: Self-pay | Admitting: Gynecologic Oncology

## 2015-02-09 ENCOUNTER — Ambulatory Visit: Payer: 59 | Attending: Gynecologic Oncology | Admitting: Gynecologic Oncology

## 2015-02-09 VITALS — BP 155/60 | HR 75 | Temp 97.6°F | Resp 18 | Ht 67.0 in | Wt 242.2 lb

## 2015-02-09 DIAGNOSIS — N838 Other noninflammatory disorders of ovary, fallopian tube and broad ligament: Secondary | ICD-10-CM | POA: Insufficient documentation

## 2015-02-09 DIAGNOSIS — N839 Noninflammatory disorder of ovary, fallopian tube and broad ligament, unspecified: Secondary | ICD-10-CM | POA: Insufficient documentation

## 2015-02-09 DIAGNOSIS — N83201 Unspecified ovarian cyst, right side: Secondary | ICD-10-CM | POA: Diagnosis not present

## 2015-02-09 DIAGNOSIS — Z6838 Body mass index (BMI) 38.0-38.9, adult: Secondary | ICD-10-CM

## 2015-02-09 DIAGNOSIS — E669 Obesity, unspecified: Secondary | ICD-10-CM

## 2015-02-09 DIAGNOSIS — N83202 Unspecified ovarian cyst, left side: Secondary | ICD-10-CM

## 2015-02-09 DIAGNOSIS — E119 Type 2 diabetes mellitus without complications: Secondary | ICD-10-CM

## 2015-02-09 NOTE — Patient Instructions (Signed)
Preparing for your Surgery  Plan for surgery on December 27 with Dr. Everitt Amber.  You are scheduled for a robotic assisted total hysterectomy, bilateral salpingo-oophorectomy, possible staging, mini-laparotomy.  Pre-operative Testing -You will receive a phone call from presurgical testing at [facility] to arrange for a pre-operative testing appointment before your surgery.  This appointment normally occurs one to two weeks before your scheduled surgery.   -Bring your insurance card, copy of an advanced directive if applicable, medication list  -At that visit, you will be asked to sign a consent for a possible blood transfusion in case a transfusion becomes necessary during surgery.  The need for a blood transfusion is rare but having consent is a necessary part of your care.     -You should not be taking blood thinners or aspirin at least ten days prior to surgery unless instructed by your surgeon.  Day Before Surgery at [facility] will be asked to take in only clear liquids the day before surgery.  Examples of clear liquids include broths, jello, and clear juices.  Avoid carbonated beverages.  You will be advised to have nothing to eat or drink after midnight the evening before.    Your role in recovery Your role is to become active as soon as directed by your doctor, while still giving yourself time to heal.  Rest when you feel tired. You will be asked to do the following in order to speed your recovery:  - Cough and breathe deeply. This helps toclear and expand your lungs and can prevent pneumonia. You may be given a spirometer to practice deep breathing. A staff member will show you how to use the spirometer. - Do mild physical activity. Walking or moving your legs help your circulation and body functions return to normal. A staff member will help you when you try to walk and will provide you with simple exercises. Do not try to get up or walk alone the first time. -  Actively manage your pain. Managing your pain lets you move in comfort. We will ask you to rate your pain on a scale of zero to 10. It is your responsibility to tell your doctor or nurse where and how much you hurt so your pain can be treated.  Special Considerations -If you are diabetic, you may be placed on insulin after surgery to have closer control over your blood sugars to promote healing and recovery.  This does not mean that you will be discharged on insulin.  If applicable, your oral antidiabetics will be resumed when you are tolerating a solid diet.  -Your final pathology results from surgery should be available by the Friday after surgery and the results will be relayed to you when available.  Blood Transfusion Information WHAT IS A BLOOD TRANSFUSION? A transfusion is the replacement of blood or some of its parts. Blood is made up of multiple cells which provide different functions.  Red blood cells carry oxygen and are used for blood loss replacement.  White blood cells fight against infection.  Platelets control bleeding.  Plasma helps clot blood.  Other blood products are available for specialized needs, such as hemophilia or other clotting disorders. BEFORE THE TRANSFUSION  Who gives blood for transfusions?   You may be able to donate blood to be used at a later date on yourself (autologous donation).  Relatives can be asked to donate blood. This is generally not any safer than if you have received blood from a stranger.  The same precautions are taken to ensure safety when a relative's blood is donated.  Healthy volunteers who are fully evaluated to make sure their blood is safe. This is blood bank blood. Transfusion therapy is the safest it has ever been in the practice of medicine. Before blood is taken from a donor, a complete history is taken to make sure that person has no history of diseases nor engages in risky social behavior (examples are intravenous drug use or  sexual activity with multiple partners). The donor's travel history is screened to minimize risk of transmitting infections, such as malaria. The donated blood is tested for signs of infectious diseases, such as HIV and hepatitis. The blood is then tested to be sure it is compatible with you in order to minimize the chance of a transfusion reaction. If you or a relative donates blood, this is often done in anticipation of surgery and is not appropriate for emergency situations. It takes many days to process the donated blood. RISKS AND COMPLICATIONS Although transfusion therapy is very safe and saves many lives, the main dangers of transfusion include:   Getting an infectious disease.  Developing a transfusion reaction. This is an allergic reaction to something in the blood you were given. Every precaution is taken to prevent this. The decision to have a blood transfusion has been considered carefully by your caregiver before blood is given. Blood is not given unless the benefits outweigh the risks.

## 2015-02-09 NOTE — Progress Notes (Signed)
Preoperative Note: Gyn-Onc  Consult was originally requested by Dr. Gaetano Net for the evaluation of [name] 60 y.o. female with ovarian cysts (bilateral)  CC:  Chief Complaint  Patient presents with  . ovarin mass    Pre-Op visit    Assessment/Plan:  [name]  is a 59 y.o.  year old with bilateral complex cystic and solid ovarian cysts with a normal CA 125 (15) and multiple medical comorbidities including obesity (BMI 38kg/m2), poorly controlled type II DM (on an insulin pump) and CAD.  That I overall have a low suspicion that these masses represent malignancy given their appearance on imaging (which we reviewed together in the room with the patient), normal CA-125, and lack of associated findings on imaging suggestive of ovarian cancer. However given the complex nature of these cysts in a postmenopausal age woman I recommend a surgical removal and pathologic testing. I therefore recommend robotic assisted total hysterectomy with BSO and possible staging. I discussed that we will perform frozen section on the ovaries intraoperatively, and if malignancy is identified we will perform lymphadenectomy and omentectomy as deemed appropriate. She has a narrow vaginal introitus and large ovarian masses and I do not believe that these will be amenable to a vaginal delivery of the specimens. However due to her obesity, diabetes mellitus, and concerns with healing from a large laparotomy, I believe a minilaparotomy for specimen delivery would be preferable to a primary surgery via laparotomy.  I discussed with the patient that she is at increased risk for consultation particularly wound healing, infection, and damage to internal organs due to her obesity and poor controlled diabetes.  She has worked at optimizing her DM (originally her A1C was 10, and in November it was 8.4).  We reviewed surgical risks again and anticipated recovery.   HPI: [name] is a 59 year old G0 who is seen  in consultation at the request of Dr. Gertie Fey for bilateral ovarian cysts. The patient developed an episode of flank pain in early September 2016. On 11/03/2014 she underwent a CT scan of the abdomen and pelvis and this revealed a 1 mm not instructing stone in the right kidney. However there was an incidental finding of bilateral ovarian masses adjacent to the uterus. The mass on the right measured 7.3 x 9.7 x 8.3 cm. The mass on the left measured 7.8 x 7.1 x 7.7 cm. There was no evidence of adenopathy, ascites, or peritoneal carcinomatosis.  The patient was then seen by gynecologist Dr. Suezanne Cheshire add who performed a pelvic ultrasound which confirmed the above findings that was seen on CT scan. This ultrasound performed on 11/12/2014. A CA-125 was drawn on 11/21/2014 and was normal at 15.3.  The patient is asymptomatic with these masses and has no abdominal bloating early satiety or pelvic pain. It if she is no family history for breast or ovarian cancer. She is nulliparous. She has significant medical history for poorly controlled type 2 diabetes mellitus completed by coronary artery disease. She takes an insulin pump. She reports her last HbA1c was 9%. She is able to and/or upper fight of stairs but does develop some shortness of breath with this. She is no chest pain at rest or with walking up a flight of stairs. She has a history of confirmed coronary artery disease but has not had coronary artery stenting and is not on anti-platelet therapy.  Current Meds:  Outpatient Encounter Prescriptions as of 02/09/2015  Medication Sig  . ACCU-CHEK AVIVA PLUS test strip   .  atorvastatin (LIPITOR) 20 MG tablet Take 20 mg by mouth daily.  Marland Kitchen buPROPion (WELLBUTRIN SR) 200 MG 12 hr tablet Take 200 mg by mouth daily.   Marland Kitchen escitalopram (LEXAPRO) 20 MG tablet Take 40 mg by mouth daily.  Marland Kitchen gabapentin (NEURONTIN) 300 MG capsule Take 300 mg by mouth 3 (three) times daily.  Marland Kitchen HUMALOG 100 UNIT/ML injection   . Insulin Human  (INSULIN PUMP) SOLN Inject into the skin continuous. Humalog - basal rate 2.75/hr  . levothyroxine (SYNTHROID, LEVOTHROID) 112 MCG tablet Take 112 mcg by mouth daily before breakfast.  . valsartan-hydrochlorothiazide (DIOVAN-HCT) 80-12.5 MG per tablet Take 1 tablet by mouth daily.  Marland Kitchen HYDROcodone-acetaminophen (NORCO/VICODIN) 5-325 MG tablet Reported on 02/09/2015  . nicotine (NICODERM CQ - DOSED IN MG/24 HOURS) 14 mg/24hr patch Place 1 patch (14 mg total) onto the skin daily. (Patient not taking: Reported on 02/09/2015)  . ondansetron (ZOFRAN-ODT) 4 MG disintegrating tablet Reported on 02/09/2015  . [DISCONTINUED] amoxicillin-clavulanate (AUGMENTIN) 875-125 MG tablet   . [DISCONTINUED] doxycycline (VIBRA-TABS) 100 MG tablet    No facility-administered encounter medications on file as of 02/09/2015.    Allergy:  No Known Allergies  Social Hx:   Social History   Social History  . Marital Status: Married    Spouse Name: N/A  . Number of Children: N/A  . Years of Education: 14   Occupational History  . respiratory therapist Other    [employer]   Social History Main Topics  . Smoking status: Former Smoker -- 1.00 packs/day for 25 years    Types: Cigarettes    Quit date: 12/15/2014  . Smokeless tobacco: Never Used  . Alcohol Use: No  . Drug Use: No  . Sexual Activity: Not on file   Other Topics Concern  . Not on file   Social History Narrative    Past Surgical Hx:  Past Surgical History  Procedure Laterality Date  . Cardiac catheterization  01/04/2012    subtotally occluded small 2nd marginal branch & 75% PDA lesion, normal LV function  . Cholecystectomy  2008  . Cataract extraction Bilateral     with lens implants  . Breast enhancement surgery Bilateral   . Left heart catheterization with coronary angiogram N/A 01/04/2012    Procedure: LEFT HEART CATHETERIZATION WITH CORONARY ANGIOGRAM;  Surgeon: Lorretta Harp, MD;  Location: [facility] CATH LAB;  Service:  Cardiovascular;  Laterality: N/A;  . Colonoscopy    . Posterior cervical fusion/foraminotomy    . Amputation Right 12/22/2014    Procedure: RIGHT 2ND TOE AMPUTATION;  Surgeon: [name], MD;  Location: [facility];  Service: Orthopedics;  Laterality: Right;    Past Medical Hx:  Past Medical History  Diagnosis Date  . Type 2 diabetes mellitus (HCC)     insulin pump  . Hypertension   . OSA on CPAP     AHI = 43 (per patient)  . History of nuclear stress test 12/16/2011    exercise myoview; normal images with 2-65mm ST-segment depression - subsequent cath revelaed subtotally occluded small 2nd marginal branch & 75% PDA lesion, normal LV function  . Hypothyroidism   . Coronary artery disease   . Tobacco abuse   . Depression   . Anxiety   . Neuropathy ([place])     in feet  . Arthritis     knees  . Fatty liver   . Personal history of kidney stones     Past Gynecological History:  No prior abnormal paps. G0  No LMP recorded. Patient is postmenopausal.  Family Hx:  Family History  Problem Relation Age of Onset  . Stroke Mother   . Hypertension Mother   . Diabetes Mother   . Alzheimer's disease Mother   . Diabetes Father   . COPD Father     vent-dependent, MODS  . Hypertension Sister   . Mental illness Sister     borderline personality d/o  . Mental illness Sister     schizoeffective d/o  . Diabetes Sister     Review of Systems:  Constitutional  Feels well,    ENT Normal appearing ears and nares bilaterally Skin/Breast  No rash, sores, jaundice, itching, dryness Cardiovascular  No chest pain, shortness of breath, or edema  Pulmonary  No cough or wheeze.  Gastro Intestinal  No nausea, vomitting, or diarrhoea. No bright red blood per rectum, no abdominal pain, change in bowel movement, or constipation.  Genito Urinary  No frequency, urgency, dysuria, see HPI Musculo Skeletal  No myalgia, arthralgia, joint swelling or pain  Neurologic  No weakness, numbness, change in  gait,  Psychology  No depression, anxiety, insomnia.   Vitals:  Blood pressure 155/60, pulse 75, temperature 97.6 F (36.4 C), temperature source Oral, resp. rate 18, height 5\' 7"  (1.702 m), weight 242 lb 3.2 oz (109.861 kg), SpO2 99 %.  Physical Exam: WD in NAD Neck  Supple NROM, without any enlargements.  Lymph Node Survey No cervical supraclavicular or inguinal adenopathy Cardiovascular  Pulse normal rate, regularity and rhythm. S1 and S2 normal.  Lungs  Clear to auscultation bilateraly, without wheezes/crackles/rhonchi. Good air movement.  Skin  No rash/lesions/breakdown  Psychiatry  Alert and oriented to person, place, and time  Abdomen  Normoactive bowel sounds, abdomen soft, non-tender and obese without evidence of hernia.  Back No CVA tenderness Genito Urinary  Vulva/vagina: Normal external female genitalia.  No lesions. No discharge or bleeding.  Bladder/urethra:  No lesions or masses, well supported bladder  Vagina: normal  Cervix: Normal appearing, no lesions.  Uterus:  Small, mobile, no parametrial involvement or nodularity.  Adnexa: no palpable masses. Exam limited due to body habitus. Rectal  Good tone, no masses no cul de sac nodularity.  Extremities  No bilateral cyanosis, clubbing or edema.   Donaciano Eva, MD  02/09/2015, 10:19 AM

## 2015-02-16 NOTE — Anesthesia Preprocedure Evaluation (Signed)
Anesthesia Evaluation  Patient identified by MRN, date of birth, ID band Patient awake    Reviewed: Allergy & Precautions, NPO status , Patient's Chart, lab work & pertinent test results  History of Anesthesia Complications Negative for: history of anesthetic complications  Airway Mallampati: I  TM Distance: >3 FB Neck ROM: Full    Dental  (+) Teeth Intact, Dental Advisory Given   Pulmonary sleep apnea and Continuous Positive Airway Pressure Ventilation , former smoker,    Pulmonary exam normal breath sounds clear to auscultation       Cardiovascular hypertension, Pt. on medications + CAD  Normal cardiovascular exam Rhythm:Regular     Neuro/Psych PSYCHIATRIC DISORDERS Anxiety Depression negative neurological ROS     GI/Hepatic negative GI ROS, Neg liver ROS, Fatty liver   Endo/Other  diabetes, Well Controlled, Type 2, Insulin DependentHypothyroidism Morbid obesityInsulin pump  Renal/GU      Musculoskeletal   Abdominal   Peds  Hematology   Anesthesia Other Findings   Reproductive/Obstetrics negative OB ROS                             Anesthesia Physical Anesthesia Plan  ASA: III  Anesthesia Plan: General   Post-op Pain Management:    Induction: Intravenous  Airway Management Planned: Oral ETT  Additional Equipment:   Intra-op Plan:   Post-operative Plan: Extubation in OR  Informed Consent:   Plan Discussed with: Surgeon  Anesthesia Plan Comments:         Anesthesia Quick Evaluation

## 2015-02-17 ENCOUNTER — Encounter (HOSPITAL_COMMUNITY)
Admission: RE | Disposition: A | Discharge status: Home / Self Care | Payer: Self-pay | Source: Ambulatory Visit | Attending: Gynecologic Oncology

## 2015-02-17 ENCOUNTER — Encounter (HOSPITAL_COMMUNITY): Payer: Self-pay | Admitting: Anesthesiology

## 2015-02-17 ENCOUNTER — Ambulatory Visit (HOSPITAL_COMMUNITY): Payer: 59 | Admitting: Anesthesiology

## 2015-02-17 ENCOUNTER — Ambulatory Visit (HOSPITAL_COMMUNITY)
Admission: RE | Admit: 2015-02-17 | Discharge: 2015-02-18 | Disposition: A | Discharge status: Home / Self Care | Payer: 59 | Source: Ambulatory Visit | Attending: Gynecologic Oncology | Admitting: Gynecologic Oncology

## 2015-02-17 DIAGNOSIS — F329 Major depressive disorder, single episode, unspecified: Secondary | ICD-10-CM | POA: Insufficient documentation

## 2015-02-17 DIAGNOSIS — D270 Benign neoplasm of right ovary: Secondary | ICD-10-CM | POA: Diagnosis not present

## 2015-02-17 DIAGNOSIS — G4730 Sleep apnea, unspecified: Secondary | ICD-10-CM | POA: Insufficient documentation

## 2015-02-17 DIAGNOSIS — E1165 Type 2 diabetes mellitus with hyperglycemia: Secondary | ICD-10-CM | POA: Insufficient documentation

## 2015-02-17 DIAGNOSIS — N83201 Unspecified ovarian cyst, right side: Secondary | ICD-10-CM

## 2015-02-17 DIAGNOSIS — D271 Benign neoplasm of left ovary: Secondary | ICD-10-CM | POA: Diagnosis not present

## 2015-02-17 DIAGNOSIS — N83202 Unspecified ovarian cyst, left side: Secondary | ICD-10-CM

## 2015-02-17 DIAGNOSIS — Z79899 Other long term (current) drug therapy: Secondary | ICD-10-CM | POA: Diagnosis not present

## 2015-02-17 DIAGNOSIS — N841 Polyp of cervix uteri: Secondary | ICD-10-CM | POA: Diagnosis not present

## 2015-02-17 DIAGNOSIS — D259 Leiomyoma of uterus, unspecified: Secondary | ICD-10-CM | POA: Diagnosis not present

## 2015-02-17 DIAGNOSIS — I2510 Atherosclerotic heart disease of native coronary artery without angina pectoris: Secondary | ICD-10-CM | POA: Insufficient documentation

## 2015-02-17 DIAGNOSIS — I10 Essential (primary) hypertension: Secondary | ICD-10-CM | POA: Insufficient documentation

## 2015-02-17 DIAGNOSIS — Z794 Long term (current) use of insulin: Secondary | ICD-10-CM | POA: Insufficient documentation

## 2015-02-17 DIAGNOSIS — F419 Anxiety disorder, unspecified: Secondary | ICD-10-CM | POA: Insufficient documentation

## 2015-02-17 DIAGNOSIS — Z6837 Body mass index (BMI) 37.0-37.9, adult: Secondary | ICD-10-CM | POA: Diagnosis not present

## 2015-02-17 DIAGNOSIS — R1900 Intra-abdominal and pelvic swelling, mass and lump, unspecified site: Secondary | ICD-10-CM | POA: Diagnosis present

## 2015-02-17 DIAGNOSIS — N839 Noninflammatory disorder of ovary, fallopian tube and broad ligament, unspecified: Secondary | ICD-10-CM

## 2015-02-17 DIAGNOSIS — N838 Other noninflammatory disorders of ovary, fallopian tube and broad ligament: Secondary | ICD-10-CM | POA: Diagnosis present

## 2015-02-17 HISTORY — PX: ROBOTIC ASSISTED TOTAL HYSTERECTOMY WITH BILATERAL SALPINGO OOPHERECTOMY: SHX6086

## 2015-02-17 LAB — TYPE AND SCREEN
ABO/RH(D): O NEG
ANTIBODY SCREEN: NEGATIVE

## 2015-02-17 LAB — GLUCOSE, CAPILLARY
GLUCOSE-CAPILLARY: 149 mg/dL — AB (ref 65–99)
GLUCOSE-CAPILLARY: 146 mg/dL — AB (ref 65–99)
GLUCOSE-CAPILLARY: 233 mg/dL — AB (ref 65–99)
Glucose-Capillary: 231 mg/dL — ABNORMAL HIGH (ref 65–99)
Glucose-Capillary: 277 mg/dL — ABNORMAL HIGH (ref 65–99)
Glucose-Capillary: 278 mg/dL — ABNORMAL HIGH (ref 65–99)

## 2015-02-17 SURGERY — ROBOTIC ASSISTED TOTAL HYSTERECTOMY WITH BILATERAL SALPINGO OOPHORECTOMY
Anesthesia: General | Laterality: Bilateral

## 2015-02-17 MED ORDER — ENOXAPARIN SODIUM 40 MG/0.4ML ~~LOC~~ SOLN
40.0000 mg | SUBCUTANEOUS | Status: DC
Start: 2015-02-18 — End: 2015-02-18
  Administered 2015-02-18: 40 mg via SUBCUTANEOUS
  Filled 2015-02-17 (×2): qty 0.4

## 2015-02-17 MED ORDER — PROMETHAZINE HCL 25 MG/ML IJ SOLN
12.5000 mg | INTRAMUSCULAR | Status: DC | PRN
  Administered 2015-02-17: 12.5 mg via INTRAVENOUS

## 2015-02-17 MED ORDER — MIDAZOLAM HCL 2 MG/2ML IJ SOLN
INTRAMUSCULAR | Status: DC | PRN
  Administered 2015-02-17: 2 mg via INTRAVENOUS

## 2015-02-17 MED ORDER — HYDROMORPHONE HCL 2 MG/ML IJ SOLN
INTRAMUSCULAR | Status: AC
  Filled 2015-02-17: qty 1

## 2015-02-17 MED ORDER — EPHEDRINE SULFATE 50 MG/ML IJ SOLN
INTRAMUSCULAR | Status: AC
  Filled 2015-02-17: qty 1

## 2015-02-17 MED ORDER — HYDROMORPHONE HCL 1 MG/ML IJ SOLN: 0.2500 mg | INTRAMUSCULAR | Status: DC | PRN

## 2015-02-17 MED ORDER — IRBESARTAN 75 MG PO TABS
75.0000 mg | ORAL_TABLET | Freq: Every day | ORAL | Status: DC
  Administered 2015-02-17 – 2015-02-18 (×2): 75 mg via ORAL
  Filled 2015-02-17 (×2): qty 1

## 2015-02-17 MED ORDER — INSULIN ASPART 100 UNIT/ML ~~LOC~~ SOLN: 0.0000 [IU] | SUBCUTANEOUS | Status: DC

## 2015-02-17 MED ORDER — PROPOFOL 10 MG/ML IV BOLUS
INTRAVENOUS | Status: DC | PRN
  Administered 2015-02-17: 200 mg via INTRAVENOUS

## 2015-02-17 MED ORDER — CLINDAMYCIN PHOSPHATE 900 MG/50ML IV SOLN
900.0000 mg | INTRAVENOUS | Status: AC
  Administered 2015-02-17: 900 mg via INTRAVENOUS

## 2015-02-17 MED ORDER — ONDANSETRON HCL 4 MG PO TABS: 4.0000 mg | ORAL_TABLET | Freq: Four times a day (QID) | ORAL | Status: DC | PRN

## 2015-02-17 MED ORDER — PROPOFOL 10 MG/ML IV BOLUS
INTRAVENOUS | Status: AC
  Filled 2015-02-17: qty 20

## 2015-02-17 MED ORDER — ESCITALOPRAM OXALATE 20 MG PO TABS
40.0000 mg | ORAL_TABLET | Freq: Every day | ORAL | Status: DC
  Administered 2015-02-17 – 2015-02-18 (×2): 40 mg via ORAL
  Filled 2015-02-17 (×2): qty 2

## 2015-02-17 MED ORDER — INSULIN ASPART 100 UNIT/ML ~~LOC~~ SOLN
0.0000 [IU] | Freq: Three times a day (TID) | SUBCUTANEOUS | Status: DC
  Administered 2015-02-17: 8 [IU] via SUBCUTANEOUS

## 2015-02-17 MED ORDER — LIDOCAINE HCL (CARDIAC) 20 MG/ML IV SOLN
INTRAVENOUS | Status: AC
  Filled 2015-02-17: qty 5

## 2015-02-17 MED ORDER — ATORVASTATIN CALCIUM 20 MG PO TABS
20.0000 mg | ORAL_TABLET | Freq: Every day | ORAL | Status: DC
  Administered 2015-02-17: 20 mg via ORAL
  Filled 2015-02-17 (×2): qty 1

## 2015-02-17 MED ORDER — ONDANSETRON HCL 4 MG/2ML IJ SOLN
INTRAMUSCULAR | Status: AC
  Filled 2015-02-17: qty 2

## 2015-02-17 MED ORDER — HYDROMORPHONE HCL 1 MG/ML IJ SOLN
INTRAMUSCULAR | Status: DC | PRN
  Administered 2015-02-17: .4 mg via INTRAVENOUS

## 2015-02-17 MED ORDER — DEXAMETHASONE SODIUM PHOSPHATE 10 MG/ML IJ SOLN
INTRAMUSCULAR | Status: DC | PRN
  Administered 2015-02-17: 10 mg via INTRAVENOUS

## 2015-02-17 MED ORDER — INSULIN PUMP
SUBCUTANEOUS | Status: DC
  Administered 2015-02-17 – 2015-02-18 (×6): via SUBCUTANEOUS
  Filled 2015-02-17: qty 1

## 2015-02-17 MED ORDER — HYDROCHLOROTHIAZIDE 12.5 MG PO CAPS
12.5000 mg | ORAL_CAPSULE | Freq: Every day | ORAL | Status: DC
  Administered 2015-02-17 – 2015-02-18 (×2): 12.5 mg via ORAL
  Filled 2015-02-17 (×2): qty 1

## 2015-02-17 MED ORDER — MIDAZOLAM HCL 2 MG/2ML IJ SOLN
INTRAMUSCULAR | Status: AC
  Filled 2015-02-17: qty 2

## 2015-02-17 MED ORDER — VALSARTAN-HYDROCHLOROTHIAZIDE 80-12.5 MG PO TABS: 1.0000 | ORAL_TABLET | Freq: Every day | ORAL | Status: DC

## 2015-02-17 MED ORDER — CIPROFLOXACIN IN D5W 400 MG/200ML IV SOLN
INTRAVENOUS | Status: AC
  Filled 2015-02-17: qty 200

## 2015-02-17 MED ORDER — ONDANSETRON HCL 4 MG/2ML IJ SOLN: 4.0000 mg | Freq: Four times a day (QID) | INTRAMUSCULAR | Status: DC | PRN

## 2015-02-17 MED ORDER — INSULIN ASPART 100 UNIT/ML ~~LOC~~ SOLN: 4.0000 [IU] | Freq: Three times a day (TID) | SUBCUTANEOUS | Status: DC

## 2015-02-17 MED ORDER — INSULIN PUMP: SUBCUTANEOUS | Status: DC

## 2015-02-17 MED ORDER — CLINDAMYCIN PHOSPHATE 900 MG/50ML IV SOLN
INTRAVENOUS | Status: AC
  Filled 2015-02-17: qty 50

## 2015-02-17 MED ORDER — LACTATED RINGERS IV SOLN
INTRAVENOUS | Status: DC | PRN
  Administered 2015-02-17 (×2): via INTRAVENOUS

## 2015-02-17 MED ORDER — PROMETHAZINE HCL 25 MG/ML IJ SOLN
INTRAMUSCULAR | Status: AC
  Filled 2015-02-17: qty 1

## 2015-02-17 MED ORDER — GABAPENTIN 300 MG PO CAPS
300.0000 mg | ORAL_CAPSULE | Freq: Three times a day (TID) | ORAL | Status: DC
  Administered 2015-02-17 – 2015-02-18 (×2): 300 mg via ORAL
  Filled 2015-02-17 (×6): qty 1

## 2015-02-17 MED ORDER — SUGAMMADEX SODIUM 200 MG/2ML IV SOLN
INTRAVENOUS | Status: DC | PRN
  Administered 2015-02-17: 250 mg via INTRAVENOUS

## 2015-02-17 MED ORDER — INSULIN ASPART 100 UNIT/ML ~~LOC~~ SOLN: 0.0000 [IU] | Freq: Every day | SUBCUTANEOUS | Status: DC

## 2015-02-17 MED ORDER — STERILE WATER FOR IRRIGATION IR SOLN
Status: DC | PRN
  Administered 2015-02-17: 1000 mL

## 2015-02-17 MED ORDER — ONDANSETRON HCL 4 MG/2ML IJ SOLN
INTRAMUSCULAR | Status: DC | PRN
  Administered 2015-02-17: 4 mg via INTRAVENOUS

## 2015-02-17 MED ORDER — SUCCINYLCHOLINE CHLORIDE 20 MG/ML IJ SOLN
INTRAMUSCULAR | Status: DC | PRN
  Administered 2015-02-17: 100 mg via INTRAVENOUS

## 2015-02-17 MED ORDER — SUFENTANIL CITRATE 50 MCG/ML IV SOLN
INTRAVENOUS | Status: AC
  Filled 2015-02-17: qty 1

## 2015-02-17 MED ORDER — SUGAMMADEX SODIUM 500 MG/5ML IV SOLN
INTRAVENOUS | Status: AC
  Filled 2015-02-17: qty 5

## 2015-02-17 MED ORDER — ENOXAPARIN SODIUM 40 MG/0.4ML ~~LOC~~ SOLN
40.0000 mg | SUBCUTANEOUS | Status: AC
  Administered 2015-02-17: 40 mg via SUBCUTANEOUS
  Filled 2015-02-17: qty 0.4

## 2015-02-17 MED ORDER — BUPROPION HCL ER (SR) 100 MG PO TB12
200.0000 mg | ORAL_TABLET | Freq: Every day | ORAL | Status: DC
  Administered 2015-02-18: 200 mg via ORAL
  Filled 2015-02-17 (×2): qty 2

## 2015-02-17 MED ORDER — OXYCODONE-ACETAMINOPHEN 5-325 MG PO TABS: 1.0000 | ORAL_TABLET | ORAL | Status: DC | PRN

## 2015-02-17 MED ORDER — SODIUM CHLORIDE 0.9 % IJ SOLN
INTRAMUSCULAR | Status: AC
  Filled 2015-02-17: qty 20

## 2015-02-17 MED ORDER — HYDROMORPHONE HCL 1 MG/ML IJ SOLN: 0.2000 mg | INTRAMUSCULAR | Status: DC | PRN

## 2015-02-17 MED ORDER — SODIUM CHLORIDE 0.9 % IJ SOLN
INTRAMUSCULAR | Status: AC
  Filled 2015-02-17: qty 10

## 2015-02-17 MED ORDER — ROCURONIUM BROMIDE 100 MG/10ML IV SOLN
INTRAVENOUS | Status: DC | PRN
  Administered 2015-02-17: 50 mg via INTRAVENOUS
  Administered 2015-02-17: 10 mg via INTRAVENOUS

## 2015-02-17 MED ORDER — LIDOCAINE HCL (CARDIAC) 20 MG/ML IV SOLN
INTRAVENOUS | Status: DC | PRN
  Administered 2015-02-17: 100 mg via INTRAVENOUS

## 2015-02-17 MED ORDER — KCL IN DEXTROSE-NACL 20-5-0.45 MEQ/L-%-% IV SOLN
INTRAVENOUS | Status: DC
  Administered 2015-02-17: 50 mL/h via INTRAVENOUS
  Filled 2015-02-17 (×2): qty 1000

## 2015-02-17 MED ORDER — LEVOTHYROXINE SODIUM 112 MCG PO TABS
112.0000 ug | ORAL_TABLET | Freq: Every day | ORAL | Status: DC
Start: 2015-02-18 — End: 2015-02-18
  Administered 2015-02-18: 112 ug via ORAL
  Filled 2015-02-17 (×2): qty 1

## 2015-02-17 MED ORDER — LACTATED RINGERS IV SOLN: INTRAVENOUS | Status: DC

## 2015-02-17 MED ORDER — IBUPROFEN 800 MG PO TABS
800.0000 mg | ORAL_TABLET | Freq: Three times a day (TID) | ORAL | Status: DC | PRN
Start: 2015-02-17 — End: 2015-02-18

## 2015-02-17 MED ORDER — SUFENTANIL CITRATE 50 MCG/ML IV SOLN
INTRAVENOUS | Status: DC | PRN
  Administered 2015-02-17 (×2): 10 ug via INTRAVENOUS
  Administered 2015-02-17: 20 ug via INTRAVENOUS
  Administered 2015-02-17: 10 ug via INTRAVENOUS

## 2015-02-17 MED ORDER — INSULIN PUMP
SUBCUTANEOUS | Status: DC
  Filled 2015-02-17: qty 1

## 2015-02-17 MED ORDER — LACTATED RINGERS IR SOLN
Status: DC | PRN
  Administered 2015-02-17: 1000 mL

## 2015-02-17 MED ORDER — NICOTINE 14 MG/24HR TD PT24
14.0000 mg | MEDICATED_PATCH | Freq: Every day | TRANSDERMAL | Status: DC
Start: 2015-02-17 — End: 2015-02-17

## 2015-02-17 MED ORDER — DEXAMETHASONE SODIUM PHOSPHATE 10 MG/ML IJ SOLN
INTRAMUSCULAR | Status: AC
  Filled 2015-02-17: qty 1

## 2015-02-17 MED ORDER — ROCURONIUM BROMIDE 100 MG/10ML IV SOLN
INTRAVENOUS | Status: AC
  Filled 2015-02-17: qty 1

## 2015-02-17 MED ORDER — BUPIVACAINE LIPOSOME 1.3 % IJ SUSP
20.0000 mL | Freq: Once | INTRAMUSCULAR | Status: DC
  Filled 2015-02-17: qty 20

## 2015-02-17 MED ORDER — CIPROFLOXACIN IN D5W 400 MG/200ML IV SOLN
400.0000 mg | INTRAVENOUS | Status: AC
  Administered 2015-02-17: 400 mg via INTRAVENOUS

## 2015-02-17 SURGICAL SUPPLY — 60 items
BAG SPEC RTRVL LRG 6X4 10 (ENDOMECHANICALS)
CABLE HIGH FREQUENCY MONO STRZ (ELECTRODE) ×1 IMPLANT
CHLORAPREP W/TINT 26ML (MISCELLANEOUS) ×3 IMPLANT
CORDS BIPOLAR (ELECTRODE) ×1 IMPLANT
COVER SURGICAL LIGHT HANDLE (MISCELLANEOUS) ×3 IMPLANT
COVER TIP SHEARS 8 DVNC (MISCELLANEOUS) ×2 IMPLANT
COVER TIP SHEARS 8MM DA VINCI (MISCELLANEOUS) ×1
DRAPE ARM DVNC X/XI (DISPOSABLE) ×4 IMPLANT
DRAPE COLUMN DVNC XI (DISPOSABLE) ×1 IMPLANT
DRAPE DA VINCI XI ARM (DISPOSABLE) ×4
DRAPE DA VINCI XI COLUMN (DISPOSABLE) ×1
DRAPE SHEET LG 3/4 BI-LAMINATE (DRAPES) ×6 IMPLANT
DRAPE SURG IRRIG POUCH 19X23 (DRAPES) ×3 IMPLANT
DRAPE TABLE BACK 44X90 PK DISP (DRAPES) ×2 IMPLANT
DRAPE WARM FLUID 44X44 (DRAPE) ×1 IMPLANT
ELECT PENCIL ROCKER SW 15FT (MISCELLANEOUS) ×2 IMPLANT
ELECT REM PT RETURN 9FT ADLT (ELECTROSURGICAL) ×3
ELECTRODE REM PT RTRN 9FT ADLT (ELECTROSURGICAL) ×2 IMPLANT
GLOVE BIO SURGEON STRL SZ 6 (GLOVE) ×12 IMPLANT
GLOVE BIO SURGEON STRL SZ 6.5 (GLOVE) ×6 IMPLANT
GOWN STRL REUS W/ TWL LRG LVL3 (GOWN DISPOSABLE) ×8 IMPLANT
GOWN STRL REUS W/TWL LRG LVL3 (GOWN DISPOSABLE) ×15
HOLDER FOLEY CATH W/STRAP (MISCELLANEOUS) ×3 IMPLANT
KIT BASIN OR (CUSTOM PROCEDURE TRAY) ×3 IMPLANT
KIT PROCEDURE DA VINCI SI (MISCELLANEOUS)
KIT PROCEDURE DVNC SI (MISCELLANEOUS) IMPLANT
LIQUID BAND (GAUZE/BANDAGES/DRESSINGS) ×3 IMPLANT
MANIPULATOR UTERINE 4.5 ZUMI (MISCELLANEOUS) ×3 IMPLANT
MARKER SKIN DUAL TIP RULER LAB (MISCELLANEOUS) ×3 IMPLANT
NDL SAFETY ECLIPSE 18X1.5 (NEEDLE) IMPLANT
NDL SPNL 18GX3.5 QUINCKE PK (NEEDLE) IMPLANT
NEEDLE HYPO 18GX1.5 SHARP (NEEDLE)
NEEDLE SPNL 18GX3.5 QUINCKE PK (NEEDLE) IMPLANT
OBTURATOR XI 8MM BLADELESS (TROCAR) ×2 IMPLANT
OCCLUDER COLPOPNEUMO (BALLOONS) ×3 IMPLANT
PORT ACCESS TROCAR AIRSEAL 12 (TROCAR) IMPLANT
PORT ACCESS TROCAR AIRSEAL 5M (TROCAR)
POUCH ENDO CATCH II 15MM (MISCELLANEOUS) ×4 IMPLANT
POUCH SPECIMEN RETRIEVAL 10MM (ENDOMECHANICALS) IMPLANT
SEAL CANN UNIV 5-8 DVNC XI (MISCELLANEOUS) ×4 IMPLANT
SEAL XI 5MM-8MM UNIVERSAL (MISCELLANEOUS) ×4
SET TRI-LUMEN FLTR TB AIRSEAL (TUBING) ×3 IMPLANT
SET TUBE IRRIG SUCTION NO TIP (IRRIGATION / IRRIGATOR) ×3 IMPLANT
SHEET LAVH (DRAPES) ×3 IMPLANT
SOLUTION ELECTROLUBE (MISCELLANEOUS) ×3 IMPLANT
SUT VIC AB 0 CT1 27 (SUTURE) ×3
SUT VIC AB 0 CT1 27XBRD ANTBC (SUTURE) ×2 IMPLANT
SUT VIC AB 4-0 PS2 27 (SUTURE) ×6 IMPLANT
SYR 50ML LL SCALE MARK (SYRINGE) ×3 IMPLANT
SYRINGE 10CC LL (SYRINGE) IMPLANT
TOWEL OR 17X26 10 PK STRL BLUE (TOWEL DISPOSABLE) ×2 IMPLANT
TOWEL OR NON WOVEN STRL DISP B (DISPOSABLE) ×3 IMPLANT
TRAP SPECIMEN MUCOUS 40CC (MISCELLANEOUS) IMPLANT
TRAY FOLEY W/METER SILVER 14FR (SET/KITS/TRAYS/PACK) ×3 IMPLANT
TRAY LAPAROSCOPIC (CUSTOM PROCEDURE TRAY) ×3 IMPLANT
TROCAR 12M 150ML BLUNT (TROCAR) ×1 IMPLANT
TROCAR BLADELESS OPT 5 100 (ENDOMECHANICALS) ×3 IMPLANT
TROCAR PORT AIRSEAL 5X120 (TROCAR) IMPLANT
WATER STERILE IRR 1500ML POUR (IV SOLUTION) ×2 IMPLANT
YANKAUER SUCT BULB TIP 10FT TU (MISCELLANEOUS) ×2 IMPLANT

## 2015-02-17 NOTE — Progress Notes (Signed)
Doctor Denman George called and notified of patient eating solids 112/26/2016 and patient did not get any instructions from doctor about any changes in insulin pump.

## 2015-02-17 NOTE — Progress Notes (Signed)
Went to see patient again.  Per patient, insulin pump on and running at this time.  Now that insulin pump on, RN does not need to administer Novolog SSI or Novolog 4 units tidwc as patient can bolus self with her insulin pump.  Patient more awake when I saw her this visit.   --Will follow patient during hospitalization--  Wyn Quaker RN, MSN, CDE Diabetes Coordinator Inpatient Glycemic Control Team Team Pager: [phone] (8a-5p)

## 2015-02-17 NOTE — Anesthesia Procedure Notes (Signed)
Procedure Name: Intubation Date/Time: 02/17/2015 7:42 AM Performed by: Danley Danker L Patient Re-evaluated:Patient Re-evaluated prior to inductionOxygen Delivery Method: Circle system utilized Preoxygenation: Pre-oxygenation with 100% oxygen Intubation Type: IV induction Ventilation: Mask ventilation without difficulty and Oral airway inserted - appropriate to patient size Laryngoscope Size: Sabra Heck and 2 Grade View: Grade I Tube type: Oral Tube size: 7.5 mm Number of attempts: 1 Airway Equipment and Method: Stylet Placement Confirmation: ETT inserted through vocal cords under direct vision,  breath sounds checked- equal and bilateral and positive ETCO2 Secured at: 21 cm Tube secured with: Tape Dental Injury: Teeth and Oropharynx as per pre-operative assessment

## 2015-02-17 NOTE — Progress Notes (Signed)
Patient states she was not told to do anything about her insulin pump . Will send patient to OR with pump intact 2.75 units per hour Patient states she did not follow liquid diet x 1 day had regular food yesterday.

## 2015-02-17 NOTE — Transfer of Care (Signed)
Immediate Anesthesia Transfer of Care Note  Patient: [name]  Procedure(s) Performed: Procedure(s): ROBOTIC ASSISTED TOTAL HYSTERECTOMY WITH BILATERAL SALPINGO OOPHORECTOMY (Bilateral)  Patient Location: PACU  Anesthesia Type:General  Level of Consciousness: sedated  Airway & Oxygen Therapy: Patient Spontanous Breathing and Patient connected to face mask oxygen  Post-op Assessment: Report given to RN and Post -op Vital signs reviewed and stable  Post vital signs: Reviewed and stable  Last Vitals:  Filed Vitals:   02/17/15 0550  BP: 131/65  Pulse: 78  Temp: 36.3 C  Resp: 16    Complications: No apparent anesthesia complications

## 2015-02-17 NOTE — Interval H&P Note (Signed)
History and Physical Interval Note:  02/17/2015 7:26 AM  [name]  has presented today for surgery, with the diagnosis of BILATERAL OVARIAN CYST  The various methods of treatment have been discussed with the patient and family. After consideration of risks, benefits and other options for treatment, the patient has consented to  Procedure(s): ROBOTIC ASSISTED TOTAL HYSTERECTOMY WITH BILATERAL SALPINGO OOPHORECTOMY (Bilateral) POSSIBLE LYMPHADENECTOMY POSSIBLE OMENTECTOMY POSSIBLE STAGING  POSSIBLE MINI LAPAROTOMY  (N/A) as a surgical intervention .  The patient's history has been reviewed, patient examined, no change in status, stable for surgery.  I have reviewed the patient's chart and labs.  Questions were answered to the patient's satisfaction.  She ate a regular diet yesterday despite recommendations not to. I discussed that this increases her risk for intraperitoneal visceral injury due to GI distension. I offered postponing her surgery. She declined and expressed verbal understanding of increased risks.   Donaciano Eva

## 2015-02-17 NOTE — Op Note (Signed)
OPERATIVE NOTE  Surgeon: Donaciano Eva   Assistants: Lahoma Crocker (an MD assistant was necessary for tissue manipulation, management of robotic instrumentation, retraction and positioning due to the complexity of the case and hospital policies).   Anesthesia: General endotracheal anesthesia  ASA Class: 3   Pre-operative Diagnosis: bilateral ovarian cysts  Post-operative Diagnosis: same (benign)  Operation: Robotic-assisted laparoscopic hysterectomy with bilateral salpingoophorectomy   Surgeon: Donaciano Eva  Assistant Surgeon: Lahoma Crocker MD  Anesthesia: GET  Urine Output: 200  Operative Findings:  : 6 week size uterus, 12cm left ovarian cyst (multicystic) and 8cm right ovarian cyst (multicystic). Benign masses on frozen section.  Estimated Blood Loss:  less than 50 mL      Total IV Fluids: 1,000 ml         Specimens: uterus, cervix, bilateral tubes and ovaries.         Complications:  None; patient tolerated the procedure well.         Disposition: PACU - hemodynamically stable.  Procedure Details  The patient was seen in the Holding Room. The risks, benefits, complications, treatment options, and expected outcomes were discussed with the patient.  The patient concurred with the proposed plan, giving informed consent.  The site of surgery properly noted/marked. The patient was identified as [name] and the procedure verified as a Robotic-assisted hysterectomy with bilateral salpingo oophorectomy. A Time Out was held and the above information confirmed.  After induction of anesthesia, the patient was draped and prepped in the usual sterile manner. Pt was placed in supine position after anesthesia and draped and prepped in the usual sterile manner. The abdominal drape was placed after the CholoraPrep had been allowed to dry for 3 minutes.  Her arms were tucked to her side with all appropriate precautions.  The shoulders were stabilized with  padded shoulder blocks.  The patient was placed in the semi-lithotomy position in [place].  The perineum was prepped with Betadine.  Foley catheter was placed.  A sterile speculum was placed in the vagina.  The cervix was grasped with a single-tooth tenaculum and dilated with Kennon Rounds dilators.  The ZUMI uterine manipulator with a medium colpotomizer ring was placed without difficulty.  A pneum occluder balloon was placed over the manipulator.  A second time-out was performed.  OG tube placement was confirmed and to suction.    Procedure: The patient was then prepped.  A Foley was placed to gravity.  A medium size KOH ring was used to place around the cervix after the cervix had been dilated and then a RUMI manipulator was attached in the normal manner.  The patient was then draped in the normal manner.  Next, a 5 mm skin incision was made 1 cm below the subcostal margin in the midclavicular line.  The 5 mm Optiview port and scope was used for direct entry.  Opening pressure was under 10 mm CO2.  The abdomen was insufflated and the findings were noted as above.   At this point and all points during the procedure, the patient's intra-abdominal pressure did not exceed 15 mmHg. Next, a 10 mm skin incision was made 2cm above the umbilicus and a right and left port was placed about 10 cm lateral to the robot port on the right and left side.  A fourth arm was placed in the left lower quadrant 2 cm above and superior and medial to the anterior superior iliac spine.  All ports were placed under direct visualization.  The patient was placed in steep Trendelenburg.  Bowel was away into the upper abdomen.  The robot was docked in the normal manner.  The hysterectomy was started after the round ligament on the right side was incised and the retroperitoneum was entered and the pararectal space was developed.  The ureter was noted to be on the medial leaf of the broad ligament.  The peritoneum above the ureter was  incised and stretched and the infundibulopelvic ligament was skeletonized, cauterized and cut.  The posterior peritoneum was taken down to the level of the KOH ring.  The anterior peritoneum was also taken down.  The bladder flap was created to the level of the KOH ring.  The uterine artery on the right side was skeletonized, cauterized and cut in the normal manner.  A similar procedure was performed on the left.  The colpotomy was made. The vaginal introitus was too narrow to accomodate the large ovarian cysts. Therefore, they were amputated from the uterus at the cornua and placed separately in 61mm endocatch bags. Inside the bags the cysts were ruptured with the scissors and the mucoid cyst fluid was aspirated but controlled within the bag with no spillage. When they had been made small enough for delivery vaginally, the uterus, cervix, bilateral ovaries and tubes were delivered through the vagina. The ovaries were sent for frozen section which revealed benign pathology. Pedicles were inspected and excellent hemostasis was achieved.    The colpotomy at the vaginal cuff was closed with Vicryl on a CT1 needle in a running manner.  Irrigation was used and excellent hemostasis was achieved.  At this point in the procedure was completed.  Robotic instruments were removed under direct visulaization.  The robot was undocked. The 10 mm ports were closed with Vicryl on a UR-5 needle and the fascia was closed with 0 Vicryl on a UR-5 needle.  The skin was closed with 4-0 Vicryl in a subcuticular manner.  Dermabond was applied.  Sponge, lap and needle counts correct x 2.  The patient was taken to the recovery room in stable condition.  The vagina was swabbed with  minimal bleeding noted.   All instrument and needle counts were correct x  3.   The patient was transferred to the recovery room in a stable condition.   Donaciano Eva, MD

## 2015-02-17 NOTE — Progress Notes (Signed)
Pt. Received to holding room with insulin pump intact to left lower abdomen, infusing at 2.47munitsper hour. Current CBG 146 mg/dl.  Dr. Landry Dyke notified of insulin management presently.  Advised to have insulin pump turned off and have needle removed as it would be in surgical field.  Patient turned off insulin infusion and removed needle device. Insulin pump given to patient's husband in waiting room.  Diabetes coordinator notified.  To check CBG hourly during surgery and treat accordingly.  Cordinator will follow from PACU through postop.

## 2015-02-17 NOTE — Anesthesia Postprocedure Evaluation (Signed)
Anesthesia Post Note  Patient: [name]  Procedure(s) Performed: Procedure(s) (LRB): ROBOTIC ASSISTED TOTAL HYSTERECTOMY WITH BILATERAL SALPINGO OOPHORECTOMY (Bilateral)  Patient location during evaluation: PACU Anesthesia Type: General Level of consciousness: awake and alert Pain management: pain level controlled Vital Signs Assessment: post-procedure vital signs reviewed and stable Respiratory status: spontaneous breathing, nonlabored ventilation, respiratory function stable and patient connected to nasal cannula oxygen Cardiovascular status: blood pressure returned to baseline and stable Postop Assessment: no signs of nausea or vomiting Anesthetic complications: no    Last Vitals:  Filed Vitals:   02/17/15 1126 02/17/15 1146  BP: 157/74 151/75  Pulse: 77 80  Temp: 36.7 C 36.6 C  Resp: 14 24    Last Pain:  Filed Vitals:   02/17/15 1153  PainSc: 0-No pain                 Ennifer Harston L

## 2015-02-17 NOTE — Progress Notes (Signed)
Inpatient Diabetes Program Recommendations  AACE/ADA: New Consensus Statement on Inpatient Glycemic Control (2015)  Target Ranges:  Prepandial:   less than 140 mg/dL      Peak postprandial:   less than 180 mg/dL (1-2 hours)      Critically ill patients:  140 - 180 mg/dL    Results for [name], [name] (MRN [id number]) as of 02/17/2015 14:55  Ref. Range 02/17/2015 05:54 02/17/2015 08:28 02/17/2015 10:21 02/17/2015 12:23  Glucose-Capillary Latest Ref Range: 65-99 mg/dL 149 (H) 146 (H) 231 (H) 277 (H)     Admit for: Hysterectomy  History: DM  Home DM Meds: Insulin Pump (sees Dr. Reynaldo Minium with [facility])  Current Insulin Orders: Novolog Moderate SSI (0-15 units) TID AC + HS      Novolog 4 units tidwc    -Insulin pump removed ~8am this morning due to insertion site being in the way of surgery.  CBG up to 277 mg/dl by 12pm today.  Insulin pump has been off since ~8am today.  8 units Novolog SSI given for 12pm CBG per ordered SSI.  -Spoke with patient around 12pm today.  Patient very groggy and unable to hold conversation with me.  Husband at bedside who plans to go to car to get extra insulin pump supplies so patient can resume insulin pump later today once more awake.  Patient was able to tell me her basal rate was 2.75 units/hour for 24 hours.  This would be 66 units total basal insulin per day through insulin pump.  -Went back to see patient at 3pm today.  Patient still very groggy but does arouse and able to answer questions coherently.    -Spoke to OR RN who was answering calls for Dr. Delsa Sale.  Received telephone orders to have patient resume insulin pump tonight when more awake and also received orders to change Novolog Moderate SSI to Q4 hour coverage from TID AC + HS while patient still drowsy.  Correct Insulin pump order set entered into [facility].  Spoke with RN caring for patient on unit 5W.  Notified RN of plan to have patient resume insulin pump tonight.   Patient aware and knows to start insulin pump tonight.  Once patient resumes insulin pump, RN can d/c orders for Novolog Moderate SSI and Novolog 4 units tidwc as patient can bolus self with insulin pump once she resumes insulin pump.    --Will follow patient during hospitalization--  Wyn Quaker RN, MSN, CDE Diabetes Coordinator Inpatient Glycemic Control Team Team Pager: [phone] (8a-5p)

## 2015-02-17 NOTE — Progress Notes (Signed)
[employer] called to check pt's home CPAP machine. 

## 2015-02-17 NOTE — H&P (View-Only) (Signed)
Preoperative Note: Gyn-Onc  Consult was originally requested by Dr. Gaetano Net for the evaluation of [name] 59 y.o. female with ovarian cysts (bilateral)  CC:  Chief Complaint  Patient presents with  . ovarin mass    Pre-Op visit    Assessment/Plan:  [name]  is a 59 y.o.  year old with bilateral complex cystic and solid ovarian cysts with a normal CA 125 (15) and multiple medical comorbidities including obesity (BMI 38kg/m2), poorly controlled type II DM (on an insulin pump) and CAD.  That I overall have a low suspicion that these masses represent malignancy given their appearance on imaging (which we reviewed together in the room with the patient), normal CA-125, and lack of associated findings on imaging suggestive of ovarian cancer. However given the complex nature of these cysts in a postmenopausal age woman I recommend a surgical removal and pathologic testing. I therefore recommend robotic assisted total hysterectomy with BSO and possible staging. I discussed that we will perform frozen section on the ovaries intraoperatively, and if malignancy is identified we will perform lymphadenectomy and omentectomy as deemed appropriate. She has a narrow vaginal introitus and large ovarian masses and I do not believe that these will be amenable to a vaginal delivery of the specimens. However due to her obesity, diabetes mellitus, and concerns with healing from a large laparotomy, I believe a minilaparotomy for specimen delivery would be preferable to a primary surgery via laparotomy.  I discussed with the patient that she is at increased risk for consultation particularly wound healing, infection, and damage to internal organs due to her obesity and poor controlled diabetes.  She has worked at optimizing her DM (originally her A1C was 10, and in November it was 8.4).  We reviewed surgical risks again and anticipated recovery.   HPI: Ms [name] is a 59 year old G0 who is seen  in consultation at the request of Dr. Gertie Fey for bilateral ovarian cysts. The patient developed an episode of flank pain in early September 2016. On 11/03/2014 she underwent a CT scan of the abdomen and pelvis and this revealed a 1 mm not instructing stone in the right kidney. However there was an incidental finding of bilateral ovarian masses adjacent to the uterus. The mass on the right measured 7.3 x 9.7 x 8.3 cm. The mass on the left measured 7.8 x 7.1 x 7.7 cm. There was no evidence of adenopathy, ascites, or peritoneal carcinomatosis.  The patient was then seen by gynecologist Dr. Suezanne Cheshire add who performed a pelvic ultrasound which confirmed the above findings that was seen on CT scan. This ultrasound performed on 11/12/2014. A CA-125 was drawn on 11/21/2014 and was normal at 15.3.  The patient is asymptomatic with these masses and has no abdominal bloating early satiety or pelvic pain. It if she is no family history for breast or ovarian cancer. She is nulliparous. She has significant medical history for poorly controlled type 2 diabetes mellitus completed by coronary artery disease. She takes an insulin pump. She reports her last HbA1c was 9%. She is able to and/or upper fight of stairs but does develop some shortness of breath with this. She is no chest pain at rest or with walking up a flight of stairs. She has a history of confirmed coronary artery disease but has not had coronary artery stenting and is not on anti-platelet therapy.  Current Meds:  Outpatient Encounter Prescriptions as of 02/09/2015  Medication Sig  . ACCU-CHEK AVIVA PLUS test strip   .  atorvastatin (LIPITOR) 20 MG tablet Take 20 mg by mouth daily.  Marland Kitchen buPROPion (WELLBUTRIN SR) 200 MG 12 hr tablet Take 200 mg by mouth daily.   Marland Kitchen escitalopram (LEXAPRO) 20 MG tablet Take 40 mg by mouth daily.  Marland Kitchen gabapentin (NEURONTIN) 300 MG capsule Take 300 mg by mouth 3 (three) times daily.  Marland Kitchen HUMALOG 100 UNIT/ML injection   . Insulin Human  (INSULIN PUMP) SOLN Inject into the skin continuous. Humalog - basal rate 2.75/hr  . levothyroxine (SYNTHROID, LEVOTHROID) 112 MCG tablet Take 112 mcg by mouth daily before breakfast.  . valsartan-hydrochlorothiazide (DIOVAN-HCT) 80-12.5 MG per tablet Take 1 tablet by mouth daily.  Marland Kitchen HYDROcodone-acetaminophen (NORCO/VICODIN) 5-325 MG tablet Reported on 02/09/2015  . nicotine (NICODERM CQ - DOSED IN MG/24 HOURS) 14 mg/24hr patch Place 1 patch (14 mg total) onto the skin daily. (Patient not taking: Reported on 02/09/2015)  . ondansetron (ZOFRAN-ODT) 4 MG disintegrating tablet Reported on 02/09/2015  . [DISCONTINUED] amoxicillin-clavulanate (AUGMENTIN) 875-125 MG tablet   . [DISCONTINUED] doxycycline (VIBRA-TABS) 100 MG tablet    No facility-administered encounter medications on file as of 02/09/2015.    Allergy:  No Known Allergies  Social Hx:   Social History   Social History  . Marital Status: Married    Spouse Name: N/A  . Number of Children: N/A  . Years of Education: 14   Occupational History  . respiratory therapist Other    [employer]   Social History Main Topics  . Smoking status: Former Smoker -- 1.00 packs/day for 25 years    Types: Cigarettes    Quit date: 12/15/2014  . Smokeless tobacco: Never Used  . Alcohol Use: No  . Drug Use: No  . Sexual Activity: Not on file   Other Topics Concern  . Not on file   Social History Narrative    Past Surgical Hx:  Past Surgical History  Procedure Laterality Date  . Cardiac catheterization  01/04/2012    subtotally occluded small 2nd marginal branch & 75% PDA lesion, normal LV function  . Cholecystectomy  2008  . Cataract extraction Bilateral     with lens implants  . Breast enhancement surgery Bilateral   . Left heart catheterization with coronary angiogram N/A 01/04/2012    Procedure: LEFT HEART CATHETERIZATION WITH CORONARY ANGIOGRAM;  Surgeon: Lorretta Harp, MD;  Location: [facility] CATH LAB;  Service:  Cardiovascular;  Laterality: N/A;  . Colonoscopy    . Posterior cervical fusion/foraminotomy    . Amputation Right 12/22/2014    Procedure: RIGHT 2ND TOE AMPUTATION;  Surgeon: [name], MD;  Location: [facility];  Service: Orthopedics;  Laterality: Right;    Past Medical Hx:  Past Medical History  Diagnosis Date  . Type 2 diabetes mellitus (HCC)     insulin pump  . Hypertension   . OSA on CPAP     AHI = 43 (per patient)  . History of nuclear stress test 12/16/2011    exercise myoview; normal images with 2-38mm ST-segment depression - subsequent cath revelaed subtotally occluded small 2nd marginal branch & 75% PDA lesion, normal LV function  . Hypothyroidism   . Coronary artery disease   . Tobacco abuse   . Depression   . Anxiety   . Neuropathy ([place])     in feet  . Arthritis     knees  . Fatty liver   . Personal history of kidney stones     Past Gynecological History:  No prior abnormal paps. G0  No LMP recorded. Patient is postmenopausal.  Family Hx:  Family History  Problem Relation Age of Onset  . Stroke Mother   . Hypertension Mother   . Diabetes Mother   . Alzheimer's disease Mother   . Diabetes Father   . COPD Father     vent-dependent, MODS  . Hypertension Sister   . Mental illness Sister     borderline personality d/o  . Mental illness Sister     schizoeffective d/o  . Diabetes Sister     Review of Systems:  Constitutional  Feels well,    ENT Normal appearing ears and nares bilaterally Skin/Breast  No rash, sores, jaundice, itching, dryness Cardiovascular  No chest pain, shortness of breath, or edema  Pulmonary  No cough or wheeze.  Gastro Intestinal  No nausea, vomitting, or diarrhoea. No bright red blood per rectum, no abdominal pain, change in bowel movement, or constipation.  Genito Urinary  No frequency, urgency, dysuria, see HPI Musculo Skeletal  No myalgia, arthralgia, joint swelling or pain  Neurologic  No weakness, numbness, change in  gait,  Psychology  No depression, anxiety, insomnia.   Vitals:  Blood pressure 155/60, pulse 75, temperature 97.6 F (36.4 C), temperature source Oral, resp. rate 18, height 5\' 7"  (1.702 m), weight 242 lb 3.2 oz (109.861 kg), SpO2 99 %.  Physical Exam: WD in NAD Neck  Supple NROM, without any enlargements.  Lymph Node Survey No cervical supraclavicular or inguinal adenopathy Cardiovascular  Pulse normal rate, regularity and rhythm. S1 and S2 normal.  Lungs  Clear to auscultation bilateraly, without wheezes/crackles/rhonchi. Good air movement.  Skin  No rash/lesions/breakdown  Psychiatry  Alert and oriented to person, place, and time  Abdomen  Normoactive bowel sounds, abdomen soft, non-tender and obese without evidence of hernia.  Back No CVA tenderness Genito Urinary  Vulva/vagina: Normal external female genitalia.  No lesions. No discharge or bleeding.  Bladder/urethra:  No lesions or masses, well supported bladder  Vagina: normal  Cervix: Normal appearing, no lesions.  Uterus:  Small, mobile, no parametrial involvement or nodularity.  Adnexa: no palpable masses. Exam limited due to body habitus. Rectal  Good tone, no masses no cul de sac nodularity.  Extremities  No bilateral cyanosis, clubbing or edema.   Donaciano Eva, MD  02/09/2015, 10:19 AM

## 2015-02-17 NOTE — Progress Notes (Signed)
Webb Silversmith notified pt will be in 1539 in 20 minutes, needs:  Bed, pump and sleep apnea monitoring.

## 2015-02-18 DIAGNOSIS — D271 Benign neoplasm of left ovary: Secondary | ICD-10-CM | POA: Diagnosis not present

## 2015-02-18 LAB — CBC
HCT: 39.5 % (ref 36.0–46.0)
HEMOGLOBIN: 12.8 g/dL (ref 12.0–15.0)
MCH: 30 pg (ref 26.0–34.0)
MCHC: 32.4 g/dL (ref 30.0–36.0)
MCV: 92.5 fL (ref 78.0–100.0)
Platelets: 237 10*3/uL (ref 150–400)
RBC: 4.27 MIL/uL (ref 3.87–5.11)
RDW: 12.8 % (ref 11.5–15.5)
WBC: 13.2 10*3/uL — ABNORMAL HIGH (ref 4.0–10.5)

## 2015-02-18 LAB — BASIC METABOLIC PANEL
Anion gap: 9 (ref 5–15)
BUN: 19 mg/dL (ref 6–20)
CALCIUM: 9.5 mg/dL (ref 8.9–10.3)
CHLORIDE: 102 mmol/L (ref 101–111)
CO2: 25 mmol/L (ref 22–32)
CREATININE: 0.75 mg/dL (ref 0.44–1.00)
GFR calc Af Amer: >60 mL/min (ref >60)
GFR calc non Af Amer: >60 mL/min (ref >60)
Glucose, Bld: 213 mg/dL — ABNORMAL HIGH (ref 65–99)
Potassium: 4.1 mmol/L (ref 3.5–5.1)
SODIUM: 136 mmol/L (ref 135–145)

## 2015-02-18 LAB — GLUCOSE, CAPILLARY
GLUCOSE-CAPILLARY: 214 mg/dL — AB (ref 65–99)
Glucose-Capillary: 184 mg/dL — ABNORMAL HIGH (ref 65–99)

## 2015-02-18 MED ORDER — OXYCODONE-ACETAMINOPHEN 5-325 MG PO TABS: 1.0000 | ORAL_TABLET | ORAL | Status: DC | PRN

## 2015-02-18 NOTE — Progress Notes (Signed)
Foley discontinued at 07:45am  On 02/18/15. Pt verbalized understanding to let staff know upon initial voiding. Nurse Patty and NT Destiny made aware.

## 2015-02-18 NOTE — Discharge Summary (Signed)
Physician Discharge Summary  Patient ID: [name] MRN: [id number] DOB/AGE: [birth year] 59 y.o.  Admit date: 02/17/2015 Discharge date: 02/18/2015  Admission Diagnoses: Pelvic mass in female  Discharge Diagnoses:  Principal Problem:   Pelvic mass in female Active Problems:   Mass, ovarian   Discharged Condition:  The patient is in good condition and stable for discharge.    Hospital Course: On 02/17/2015, the patient underwent the following: Procedure(s): ROBOTIC ASSISTED TOTAL HYSTERECTOMY WITH BILATERAL SALPINGO OOPHORECTOMY.  The postoperative course was uneventful.  She was discharged to home on postoperative day 1 tolerating a regular diet, voiding without difficulty, passing flatus, with minimal pain.  Consults: None  Significant Diagnostic Studies: None  Treatments: surgery: see above  Discharge Exam: Blood pressure 112/67, pulse 80, temperature 97.6 F (36.4 C), temperature source Oral, resp. rate 16, height 5\' 7"  (1.702 m), weight 242 lb (109.77 kg), SpO2 97 %. General appearance: alert, cooperative and no distress Resp: clear to auscultation bilaterally Cardio: regular rate and rhythm, S1, S2 normal, no murmur, click, rub or gallop GI: soft, non-tender; bowel sounds normal; no masses,  no organomegaly Extremities: extremities normal, atraumatic, no cyanosis or edema Incision/Wound: lap sites to the abdomen with dermabond without erythema or drainage  Disposition: 01-Home or Self Care      Discharge Instructions    Call MD for:  difficulty breathing, headache or visual disturbances    Complete by:  As directed      Call MD for:  extreme fatigue    Complete by:  As directed      Call MD for:  hives    Complete by:  As directed      Call MD for:  persistant dizziness or light-headedness    Complete by:  As directed      Call MD for:  persistant nausea and vomiting    Complete by:  As directed      Call MD for:  redness, tenderness, or signs of  infection (pain, swelling, redness, odor or green/yellow discharge around incision site)    Complete by:  As directed      Call MD for:  severe uncontrolled pain    Complete by:  As directed      Call MD for:  temperature >100.4    Complete by:  As directed      Diet - low sodium heart healthy    Complete by:  As directed      Driving Restrictions    Complete by:  As directed   No driving for 1 week.  Do not take narcotics and drive.     Increase activity slowly    Complete by:  As directed      Lifting restrictions    Complete by:  As directed   No lifting greater than 10 lbs.     Sexual Activity Restrictions    Complete by:  As directed   No sexual activity, nothing in the vagina, for 6 weeks.            Medication List    STOP taking these medications        HYDROcodone-acetaminophen 5-325 MG tablet  Commonly known as:  NORCO/VICODIN      TAKE these medications        ACCU-CHEK AVIVA PLUS test strip  Generic drug:  glucose blood     atorvastatin 20 MG tablet  Commonly known as:  LIPITOR  Take 20 mg by mouth daily.     buPROPion  200 MG 12 hr tablet  Commonly known as:  WELLBUTRIN SR  Take 200 mg by mouth daily.     escitalopram 20 MG tablet  Commonly known as:  LEXAPRO  Take 40 mg by mouth daily.     gabapentin 300 MG capsule  Commonly known as:  NEURONTIN  Take 300 mg by mouth 3 (three) times daily.     HUMALOG 100 UNIT/ML injection  Generic drug:  insulin lispro     insulin pump Soln  Inject into the skin continuous. Humalog - basal rate 2.75/hr     levothyroxine 112 MCG tablet  Commonly known as:  SYNTHROID, LEVOTHROID  Take 112 mcg by mouth daily before breakfast.     nicotine 14 mg/24hr patch  Commonly known as:  NICODERM CQ - dosed in mg/24 hours  Place 1 patch (14 mg total) onto the skin daily.     ondansetron 4 MG disintegrating tablet  Commonly known as:  ZOFRAN-ODT  Reported on 02/09/2015     oxyCODONE-acetaminophen 5-325 MG tablet   Commonly known as:  PERCOCET/ROXICET  Take 1-2 tablets by mouth every 4 (four) hours as needed for severe pain (moderate to severe pain).     valsartan-hydrochlorothiazide 80-12.5 MG tablet  Commonly known as:  DIOVAN-HCT  Take 1 tablet by mouth daily.       Follow-up Information    Follow up with Donaciano Eva, MD On 03/11/2015.   Specialty:  Obstetrics and Gynecology   Why:  at the [facility] information:   [place] [id number] [phone]       Greater than thirty minutes were spend for face to face discharge instructions and discharge orders/summary in EPIC.   Signed: Roseanne Juenger DEAL 02/18/2015, 2:01 PM

## 2015-02-18 NOTE — Discharge Instructions (Signed)
02/18/2015  Return to work: 4-6 weeks if applicable  Activity: 1. Be up and out of the bed during the day.  Take a nap if needed.  You may walk up steps but be careful and use the hand rail.  Stair climbing will tire you more than you think, you may need to stop part way and rest.   2. No lifting or straining for 6 weeks.  3. No driving for 1 week(s).  Do not drive if you are taking narcotic pain medicine.  4. Shower daily.  Use soap and water on your incision and pat dry; don't rub.  No tub baths until cleared by your surgeon.   5. No sexual activity and nothing in the vagina for 6 weeks.  6. You may experience a small amount of clear drainage from your incisions, which is normal.  If the drainage persists or increases, please call the office.   Diet: 1. Low sodium Heart Healthy Diet is recommended.  2. It is safe to use a laxative, such as Miralax or Colace, if you have difficulty moving your bowels.   Wound Care: 1. Keep clean and dry.  Shower daily.  Reasons to call the Doctor:  Fever - Oral temperature greater than 100.4 degrees Fahrenheit  Foul-smelling vaginal discharge  Difficulty urinating  Nausea and vomiting  Increased pain at the site of the incision that is unrelieved with pain medicine.  Difficulty breathing with or without chest pain  New calf pain especially if only on one side  Sudden, continuing increased vaginal bleeding with or without clots.   Contacts: For questions or concerns you should contact:  Dr. Everitt Amber at [phone]  [name] John, NP at [phone]  After Hours: call [phone] and have the GYN Oncologist paged/contacted  Acetaminophen; Oxycodone tablets What is this medicine? ACETAMINOPHEN; OXYCODONE (a set a MEE noe fen; ox i KOE done) is a pain reliever. It is used to treat moderate to severe pain. This medicine may be used for other purposes; ask your health care provider or pharmacist if you have questions. What  should I tell my health care provider before I take this medicine? They need to know if you have any of these conditions: -brain tumor -Crohn's disease, inflammatory bowel disease, or ulcerative colitis -drug abuse or addiction -head injury -heart or circulation problems -if you often drink alcohol -kidney disease or problems going to the bathroom -liver disease -lung disease, asthma, or breathing problems -an unusual or allergic reaction to acetaminophen, oxycodone, other opioid analgesics, other medicines, foods, dyes, or preservatives -pregnant or trying to get pregnant -breast-feeding How should I use this medicine? Take this medicine by mouth with a full glass of water. Follow the directions on the prescription label. You can take it with or without food. If it upsets your stomach, take it with food. Take your medicine at regular intervals. Do not take it more often than directed. Talk to your pediatrician regarding the use of this medicine in children. Special care may be needed. Patients over 29 years old may have a stronger reaction and need a smaller dose. Overdosage: If you think you have taken too much of this medicine contact a poison control center or emergency room at once. NOTE: This medicine is only for you. Do not share this medicine with others. What if I miss a dose? If you miss a dose, take it as soon as you can. If it is almost time for your next dose, take only that dose.  Do not take double or extra doses. What may interact with this medicine? -alcohol -antihistamines -barbiturates like amobarbital, butalbital, butabarbital, methohexital, pentobarbital, phenobarbital, thiopental, and secobarbital -benztropine -drugs for bladder problems like solifenacin, trospium, oxybutynin, tolterodine, hyoscyamine, and methscopolamine -drugs for breathing problems like ipratropium and tiotropium -drugs for certain stomach or intestine problems like propantheline, homatropine  methylbromide, glycopyrrolate, atropine, belladonna, and dicyclomine -general anesthetics like etomidate, ketamine, nitrous oxide, propofol, desflurane, enflurane, halothane, isoflurane, and sevoflurane -medicines for depression, anxiety, or psychotic disturbances -medicines for sleep -muscle relaxants -naltrexone -narcotic medicines (opiates) for pain -phenothiazines like perphenazine, thioridazine, chlorpromazine, mesoridazine, fluphenazine, prochlorperazine, promazine, and trifluoperazine -scopolamine -tramadol -trihexyphenidyl This list may not describe all possible interactions. Give your health care provider a list of all the medicines, herbs, non-prescription drugs, or dietary supplements you use. Also tell them if you smoke, drink alcohol, or use illegal drugs. Some items may interact with your medicine. What should I watch for while using this medicine? Tell your doctor or health care professional if your pain does not go away, if it gets worse, or if you have new or a different type of pain. You may develop tolerance to the medicine. Tolerance means that you will need a higher dose of the medication for pain relief. Tolerance is normal and is expected if you take this medicine for a long time. Do not suddenly stop taking your medicine because you may develop a severe reaction. Your body becomes used to the medicine. This does NOT mean you are addicted. Addiction is a behavior related to getting and using a drug for a non-medical reason. If you have pain, you have a medical reason to take pain medicine. Your doctor will tell you how much medicine to take. If your doctor wants you to stop the medicine, the dose will be slowly lowered over time to avoid any side effects. You may get drowsy or dizzy. Do not drive, use machinery, or do anything that needs mental alertness until you know how this medicine affects you. Do not stand or sit up quickly, especially if you are an older patient. This  reduces the risk of dizzy or fainting spells. Alcohol may interfere with the effect of this medicine. Avoid alcoholic drinks. There are different types of narcotic medicines (opiates) for pain. If you take more than one type at the same time, you may have more side effects. Give your health care provider a list of all medicines you use. Your doctor will tell you how much medicine to take. Do not take more medicine than directed. Call emergency for help if you have problems breathing. The medicine will cause constipation. Try to have a bowel movement at least every 2 to 3 days. If you do not have a bowel movement for 3 days, call your doctor or health care professional. Do not take Tylenol (acetaminophen) or medicines that have acetaminophen with this medicine. Too much acetaminophen can be very dangerous. Many nonprescription medicines contain acetaminophen. Always read the labels carefully to avoid taking more acetaminophen. What side effects may I notice from receiving this medicine? Side effects that you should report to your doctor or health care professional as soon as possible: -allergic reactions like skin rash, itching or hives, swelling of the face, lips, or tongue -breathing difficulties, wheezing -confusion -light headedness or fainting spells -severe stomach pain -unusually weak or tired -yellowing of the skin or the whites of the eyes Side effects that usually do not require medical attention (report to your doctor or health  care professional if they continue or are bothersome): -dizziness -drowsiness -nausea -vomiting This list may not describe all possible side effects. Call your doctor for medical advice about side effects. You may report side effects to FDA at 1-800-FDA-1088. Where should I keep my medicine? Keep out of the reach of children. This medicine can be abused. Keep your medicine in a safe place to protect it from theft. Do not share this medicine with anyone. Selling  or giving away this medicine is dangerous and against the law. This medicine may cause accidental overdose and death if it taken by other adults, children, or pets. Mix any unused medicine with a substance like cat litter or coffee grounds. Then throw the medicine away in a sealed container like a sealed bag or a coffee can with a lid. Do not use the medicine after the expiration date. Store at room temperature between 20 and 25 degrees C (68 and 77 degrees F). NOTE: This sheet is a summary. It may not cover all possible information. If you have questions about this medicine, talk to your doctor, pharmacist, or health care provider.    2016, Elsevier/Gold Standard. (2014-01-08 15:18:46)  Abdominal Hysterectomy, Care After These instructions give you information on caring for yourself after your procedure. Your doctor may also give you more specific instructions. Call your doctor if you have any problems or questions after your procedure.  HOME CARE It takes 4-6 weeks to recover from this surgery. Follow all of your doctor's instructions.   Only take medicines as told by your doctor.  Change your bandage as told by your doctor.  Return to your doctor to have your stitches taken out.  Take showers for 2-3 weeks. Ask your doctor when it is okay to shower.  Do not douche, use tampons, or have sex (intercourse) for at least 6 weeks or as told.  Follow your doctor's advice about exercise, lifting objects, driving, and general activities.  Get plenty of rest and sleep.  Do not lift anything heavier than a gallon of milk (about 10 pounds [4.5 kilograms]) for the first month after surgery.  Get back to your normal diet as told by your doctor.  Do not drink alcohol until your doctor says it is okay.  Take a medicine to help you poop (laxative) as told by your doctor.  Eating foods high in fiber may help you poop. Eat a lot of raw fruits and vegetables, whole grains, and beans.  Drink  enough fluids to keep your pee (urine) clear or pale yellow.  Have someone help you at home for 1-2 weeks after your surgery.  Keep follow-up doctor visits as told. GET HELP IF:  You have chills or fever.  You have puffiness, redness, or pain in area of the cut (incision).  You have yellowish-white fluid (pus) coming from the cut.  You have a bad smell coming from the cut or bandage.  Your cut pulls apart.  You feel dizzy or light-headed.  You have pain or bleeding when you pee.  You keep having watery poop (diarrhea).  You keep feeling sick to your stomach (nauseous) or keep throwing up (vomiting).  You have fluid (discharge) coming from your vagina.  You have a rash.  You have a reaction to your medicine.  You need stronger pain medicine. GET HELP RIGHT AWAY IF:   You have a fever and your symptoms suddenly get worse.  You have bad belly (abdominal) pain.  You have chest pain.  You  are short of breath.  You pass out (faint).  You have pain, puffiness, or redness of your leg.  You bleed a lot from your vagina and notice clumps of tissue (clots). MAKE SURE YOU:   Understand these instructions.  Will watch your condition.  Will get help right away if you are not doing well or get worse.   This information is not intended to replace advice given to you by your health care provider. Make sure you discuss any questions you have with your health care provider.   Document Released: 11/17/2007 Document Revised: 02/12/2013 Document Reviewed: 11/30/2012 Elsevier Interactive Patient Education [employer].

## 2015-03-11 ENCOUNTER — Ambulatory Visit: Payer: 59 | Attending: Gynecologic Oncology | Admitting: Gynecologic Oncology

## 2015-03-11 ENCOUNTER — Encounter: Payer: Self-pay | Admitting: Gynecologic Oncology

## 2015-03-11 VITALS — BP 143/71 | HR 80 | Temp 97.8°F | Resp 18 | Ht 67.0 in | Wt 244.0 lb

## 2015-03-11 DIAGNOSIS — N838 Other noninflammatory disorders of ovary, fallopian tube and broad ligament: Secondary | ICD-10-CM

## 2015-03-11 DIAGNOSIS — N839 Noninflammatory disorder of ovary, fallopian tube and broad ligament, unspecified: Secondary | ICD-10-CM | POA: Diagnosis not present

## 2015-03-11 NOTE — Progress Notes (Signed)
FOLLOWUP VISIT  Assessment:    60 y.o. year old with bilateral mucinous cystadenomas.   S/p robotic hysterectomy, BSO on 02/17/15.   Plan: 1) Pathology reports reviewed today 2) Treatment counseling - I discussed the benign nature of these lesions. She can followup with her PCP and OBGYN (Dr Gaetano Net) for well woman care. She was given the opportunity to ask questions, which were answered to her satisfaction, and she is agreement with the above mentioned plan of care.  3)  Return to clinic on a prn basis.  HPI:  [name] is a 60 y.o. year old initially seen in consultation on 11/17/14 for bilateral ovarian cysts.  She then underwent a robotic hysterectomy, BSO on [id number] without complications.  Her postoperative course was uncomplicated.  Her final pathology revealed bilateral ovarian mucinous cysts (9cm and 11cm).  She is seen today for a postoperative check and to discuss her pathology results and ongoing plan.  Since discharge from the hospital, she is feeling well.  She has improving appetite, normal bowel and bladder function, and pain controlled with minimal PO medication. She has no other complaints today.    Review of systems: Constitutional:  She has no weight gain or weight loss. She has no fever or chills. Eyes: No blurred vision Ears, Nose, Mouth, Throat: No dizziness, headaches or changes in hearing. No mouth sores. Cardiovascular: No chest pain, palpitations or edema. Respiratory:  No shortness of breath, wheezing or cough Gastrointestinal: She has normal bowel movements without diarrhea or constipation. She denies any nausea or vomiting. She denies blood in her stool or heart burn. Genitourinary:  She denies pelvic pain, pelvic pressure or changes in her urinary function. She has no hematuria, dysuria, or incontinence. She has no irregular vaginal bleeding or vaginal discharge Musculoskeletal: Denies muscle weakness or joint pains.  Skin:  She has no skin changes,  rashes or itching Neurological:  Denies dizziness or headaches. No neuropathy, no numbness or tingling. Psychiatric:  She denies depression or anxiety. Hematologic/Lymphatic:   No easy bruising or bleeding   Physical Exam: Blood pressure 143/71, pulse 80, temperature 97.8 F (36.6 C), temperature source Oral, resp. rate 18, height 5\' 7"  (1.702 m), weight 244 lb (110.678 kg), SpO2 99 %. General: Well dressed, well nourished in no apparent distress.   HEENT:  Normocephalic and atraumatic, no lesions.  Extraocular muscles intact. Sclerae anicteric. Pupils equal, round, reactive. No mouth sores or ulcers. Thyroid is normal size, not nodular, midline. Abdomen:  Soft, nontender, nondistended.  No palpable masses.  No hepatosplenomegaly.  No ascites. Normal bowel sounds.  No hernias.  Incisions are healing well with granulation tissue at midline and right mid abdominal incisions. Genitourinary: Normal EGBUS  Vaginal cuff intact.  No bleeding or discharge.  No cul de sac fullness. Extremities: No cyanosis, clubbing or edema.  No calf tenderness or erythema. No palpable cords. Psychiatric: Mood and affect are appropriate. Neurological: Awake, alert and oriented x 3. Sensation is intact, no neuropathy.  Musculoskeletal: No pain, normal strength and range of motion.  Donaciano Eva, MD

## 2015-03-11 NOTE — Patient Instructions (Signed)
Plan to follow up with your primary care doctor or Dr. Gaetano Net for a well woman check annually.  Please call for any questions or concerns.

## 2015-03-27 ENCOUNTER — Telehealth: Payer: Self-pay

## 2015-03-27 NOTE — Telephone Encounter (Signed)
Requesting surgical clearance:   1. Type of surgery: left partial knee replacement  2. Surgeon: dr. Marchia Bond  3. Surgical date: pending clearance  4. Medications that need to be held: none  5. CAD: Yes     6. I will defer to: Beatrix ShipperVenida Jarvis [phone] fax [phone] Ext: [phone] Phone

## 2015-03-29 NOTE — Telephone Encounter (Signed)
I haven't seen her in 18 months. Will need ROV prior to surg clearance

## 2015-03-30 NOTE — Telephone Encounter (Signed)
Left message to call back Pt needs OV before she can be cleared for surgery.

## 2015-04-01 NOTE — Telephone Encounter (Signed)
Pt returned call. She is wanting to have procedure sooner than later and next . appt with Gwenlyn Found is not until March.  Pt willing to see extender to be cleared for surgery.  Pt understands that scheduling will be in contact with her to setup appointment for clearance.

## 2015-04-01 NOTE — Telephone Encounter (Signed)
Due to cancellation pt able to see Dr. Gwenlyn Found for appt on 2/10

## 2015-04-03 ENCOUNTER — Ambulatory Visit (INDEPENDENT_AMBULATORY_CARE_PROVIDER_SITE_OTHER): Payer: 59 | Admitting: Cardiovascular Disease

## 2015-04-03 ENCOUNTER — Encounter: Payer: Self-pay | Admitting: Cardiovascular Disease

## 2015-04-03 VITALS — BP 132/78 | HR 78 | Ht 67.0 in | Wt 248.3 lb

## 2015-04-03 DIAGNOSIS — Z720 Tobacco use: Secondary | ICD-10-CM

## 2015-04-03 DIAGNOSIS — I10 Essential (primary) hypertension: Secondary | ICD-10-CM | POA: Diagnosis not present

## 2015-04-03 DIAGNOSIS — I2583 Coronary atherosclerosis due to lipid rich plaque: Principal | ICD-10-CM

## 2015-04-03 DIAGNOSIS — E669 Obesity, unspecified: Secondary | ICD-10-CM | POA: Diagnosis not present

## 2015-04-03 DIAGNOSIS — E785 Hyperlipidemia, unspecified: Secondary | ICD-10-CM

## 2015-04-03 DIAGNOSIS — I2510 Atherosclerotic heart disease of native coronary artery without angina pectoris: Secondary | ICD-10-CM

## 2015-04-03 NOTE — Progress Notes (Signed)
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 She has a history of insulin-dependent diabetes on insulin pump number hypertension and tobacco abuse. She also  has obstructive sleep apnea on CPAP. She had chest pain which led to a Myoview stress test 12/16/11 which showed normal images with positive GXT. This led to heart catheterization 01/04/12 revealing a 90% stenosis in a small obtuse marginal branch (less than 2 mm) a 75% stenosis in the mid PDA. Medical therapy was recommended. She has had no chest pain since I saw her back 10/23/13. Apparently she is scheduled for elective left partial knee replacement by Dr. Mardelle Matte and is back for preoperative clearance.  Current Outpatient Prescriptions  Medication Sig Dispense Refill  . ACCU-CHEK AVIVA PLUS test strip     . atorvastatin (LIPITOR) 20 MG tablet Take 20 mg by mouth daily.    Marland Kitchen buPROPion (WELLBUTRIN SR) 200 MG 12 hr tablet Take 200 mg by mouth daily.     . diclofenac sodium (VOLTAREN) 1 % GEL 4 (four) times daily as needed.    Marland Kitchen escitalopram (LEXAPRO) 20 MG tablet Take 40 mg by mouth daily.    Marland Kitchen gabapentin (NEURONTIN) 300 MG capsule Take 300 mg by mouth 3 (three) times daily.    Marland Kitchen HUMALOG 100 UNIT/ML injection     . Insulin Human (INSULIN PUMP) SOLN Inject into the skin continuous. Humalog - basal rate 2.75/hr    . levothyroxine (SYNTHROID, LEVOTHROID) 112 MCG tablet Take 112 mcg by mouth daily before breakfast.    . nicotine (NICODERM CQ - DOSED IN MG/24 HOURS) 14 mg/24hr patch Place 1 patch (14 mg total) onto the skin daily. 28 patch 6  . valsartan-hydrochlorothiazide (DIOVAN-HCT) 80-12.5 MG per tablet Take 1 tablet by mouth daily.      . diclofenac (VOLTAREN) 75 MG EC tablet Take 1 tablet by mouth 2 (two) times daily. Take 1 tab by mouth twice a day     No current facility-administered medications for this visit.    No Known Allergies  Social History   Social History  . Marital Status: Married    Spouse Name: [name]  . Number of Children: [name]  . Years of Education: 14   Occupational History  . respiratory therapist Other    [employer]   Social History Main Topics  . Smoking status: Current Some Day Smoker -- 1.00 packs/day for 25 years    Types: Cigarettes    Last Attempt to Quit: 12/15/2014  . Smokeless tobacco: Never Used  . Alcohol Use: No  . Drug Use: No  . Sexual Activity: Not on file   Other Topics Concern  . Not on file   Social History Narrative     Review of Systems: General: negative for chills, fever, night sweats or weight changes.  Cardiovascular: negative for chest pain, dyspnea on exertion, edema, orthopnea, palpitations, paroxysmal nocturnal dyspnea or shortness of breath Dermatological: negative for rash Respiratory: negative for cough or wheezing Urologic: negative for hematuria Abdominal: negative for nausea, vomiting, diarrhea, bright red blood per rectum, melena, or hematemesis Neurologic: negative for visual changes, syncope, or dizziness All other systems reviewed and are otherwise negative except as noted above.  Blood pressure 132/78, pulse 78, height 5\' 7"  (1.702 m), weight 248 lb 5 oz (112.634 kg).  General appearance: alert and no distress Neck: no adenopathy, no carotid bruit, no JVD, supple, symmetrical, trachea midline and thyroid not enlarged, symmetric, no tenderness/mass/nodules Lungs: clear to auscultation bilaterally Heart: regular rate and rhythm, S1, S2 normal, no murmur, click, rub or gallop Extremities: extremities normal, atraumatic, no cyanosis or edema  EKG normal sinus rhythm at 80 without ST or T-wave changes. I personally reviewed  this EKG  ASSESSMENT AND PLAN:   CAD (coronary artery disease) History of CAD status post cardiac catheterization by myself 01/04/12 revealing stenoses since small vessels including an obtuse marginal branch and PDA. Medical therapy was recommended. She's had no further chest pain. She scheduled to have elective left knee surgery which I believe she can undergo at low perioperative cardiovascular risk without functional testing.  Obesity She has gained 16 pounds since I saw her last approximately 18 months ago. We talked about the importance of exercise and diet  Tobacco abuse Continued tobacco abuse currently smoking one half pack per week recalcitrant to risk factor modification.  HTN (hypertension) History of hypertension blood pressure measured at 132/78. She is on Diovan and hydrochlorothiazide. Continue current meds at current dosing  Hyperlipidemia History of hyperlipidemia on statin therapy followed by her PCP      Lorretta Harp MD [facility] 04/03/2015 8:41 AM

## 2015-04-03 NOTE — Patient Instructions (Signed)
Cleared low risk for knee surgery   Your physician wants you to follow-up in: 1 year. You will receive a reminder letter in the mail two months in advance. If you don't receive a letter, please call our office to schedule the follow-up appointment.

## 2015-04-03 NOTE — Assessment & Plan Note (Signed)
History of CAD status post cardiac catheterization by myself 01/04/12 revealing stenoses since small vessels including an obtuse marginal branch and PDA. Medical therapy was recommended. She's had no further chest pain. She scheduled to have elective left knee surgery which I believe she can undergo at low perioperative cardiovascular risk without functional testing.

## 2015-04-03 NOTE — Assessment & Plan Note (Signed)
Continued tobacco abuse currently smoking one half pack per week recalcitrant to risk factor modification.

## 2015-04-03 NOTE — Assessment & Plan Note (Signed)
History of hypertension blood pressure measured at 132/78. She is on Diovan and hydrochlorothiazide. Continue current meds at current dosing

## 2015-04-03 NOTE — Assessment & Plan Note (Signed)
She has gained 16 pounds since I saw her last approximately 18 months ago. We talked about the importance of exercise and diet

## 2015-04-03 NOTE — Assessment & Plan Note (Signed)
History of hyperlipidemia on statin therapy followed by her PCP. 

## 2015-04-07 NOTE — Telephone Encounter (Signed)
Pt clearance (last OV note) sent to [facility] Attn: Sherri.

## 2015-04-16 ENCOUNTER — Encounter (HOSPITAL_BASED_OUTPATIENT_CLINIC_OR_DEPARTMENT_OTHER): Payer: Self-pay | Admitting: *Deleted

## 2015-04-17 ENCOUNTER — Other Ambulatory Visit: Payer: Self-pay | Admitting: Orthopedic Surgery

## 2015-04-20 ENCOUNTER — Encounter (HOSPITAL_BASED_OUTPATIENT_CLINIC_OR_DEPARTMENT_OTHER)
Admission: RE | Admit: 2015-04-20 | Discharge: 2015-04-20 | Disposition: A | Discharge status: Home / Self Care | Payer: 59 | Source: Ambulatory Visit | Attending: Orthopedic Surgery | Admitting: Orthopedic Surgery

## 2015-04-20 DIAGNOSIS — Z01818 Encounter for other preprocedural examination: Secondary | ICD-10-CM | POA: Insufficient documentation

## 2015-04-20 LAB — SURGICAL PCR SCREEN
MRSA, PCR: NEGATIVE
Staphylococcus aureus: NEGATIVE

## 2015-04-20 LAB — BASIC METABOLIC PANEL
ANION GAP: 9 (ref 5–15)
BUN: 15 mg/dL (ref 6–20)
CHLORIDE: 102 mmol/L (ref 101–111)
CO2: 25 mmol/L (ref 22–32)
Calcium: 10 mg/dL (ref 8.9–10.3)
Creatinine, Ser: 0.68 mg/dL (ref 0.44–1.00)
GFR calc Af Amer: >60 mL/min (ref >60)
GFR calc non Af Amer: >60 mL/min (ref >60)
Glucose, Bld: 219 mg/dL — ABNORMAL HIGH (ref 65–99)
POTASSIUM: 4.6 mmol/L (ref 3.5–5.1)
SODIUM: 136 mmol/L (ref 135–145)

## 2015-04-24 ENCOUNTER — Ambulatory Visit (HOSPITAL_COMMUNITY): Payer: 59

## 2015-04-24 ENCOUNTER — Ambulatory Visit (HOSPITAL_BASED_OUTPATIENT_CLINIC_OR_DEPARTMENT_OTHER): Payer: 59 | Admitting: Certified Registered"

## 2015-04-24 ENCOUNTER — Encounter (HOSPITAL_BASED_OUTPATIENT_CLINIC_OR_DEPARTMENT_OTHER): Payer: Self-pay | Admitting: Certified Registered"

## 2015-04-24 ENCOUNTER — Ambulatory Visit (HOSPITAL_BASED_OUTPATIENT_CLINIC_OR_DEPARTMENT_OTHER)
Admission: RE | Admit: 2015-04-24 | Discharge: 2015-04-25 | Disposition: A | Discharge status: Home / Self Care | Payer: 59 | Source: Ambulatory Visit | Attending: Orthopedic Surgery | Admitting: Orthopedic Surgery

## 2015-04-24 ENCOUNTER — Encounter (HOSPITAL_BASED_OUTPATIENT_CLINIC_OR_DEPARTMENT_OTHER)
Admission: RE | Disposition: A | Discharge status: Home / Self Care | Payer: Self-pay | Source: Ambulatory Visit | Attending: Orthopedic Surgery

## 2015-04-24 DIAGNOSIS — F17210 Nicotine dependence, cigarettes, uncomplicated: Secondary | ICD-10-CM | POA: Diagnosis not present

## 2015-04-24 DIAGNOSIS — I2510 Atherosclerotic heart disease of native coronary artery without angina pectoris: Secondary | ICD-10-CM | POA: Insufficient documentation

## 2015-04-24 DIAGNOSIS — G4733 Obstructive sleep apnea (adult) (pediatric): Secondary | ICD-10-CM | POA: Diagnosis not present

## 2015-04-24 DIAGNOSIS — Z89421 Acquired absence of other right toe(s): Secondary | ICD-10-CM | POA: Diagnosis not present

## 2015-04-24 DIAGNOSIS — Z794 Long term (current) use of insulin: Secondary | ICD-10-CM | POA: Diagnosis not present

## 2015-04-24 DIAGNOSIS — E039 Hypothyroidism, unspecified: Secondary | ICD-10-CM | POA: Insufficient documentation

## 2015-04-24 DIAGNOSIS — Z79899 Other long term (current) drug therapy: Secondary | ICD-10-CM | POA: Diagnosis not present

## 2015-04-24 DIAGNOSIS — F329 Major depressive disorder, single episode, unspecified: Secondary | ICD-10-CM | POA: Insufficient documentation

## 2015-04-24 DIAGNOSIS — E1142 Type 2 diabetes mellitus with diabetic polyneuropathy: Secondary | ICD-10-CM | POA: Insufficient documentation

## 2015-04-24 DIAGNOSIS — I10 Essential (primary) hypertension: Secondary | ICD-10-CM | POA: Diagnosis not present

## 2015-04-24 DIAGNOSIS — M1712 Unilateral primary osteoarthritis, left knee: Secondary | ICD-10-CM | POA: Diagnosis present

## 2015-04-24 DIAGNOSIS — Z791 Long term (current) use of non-steroidal anti-inflammatories (NSAID): Secondary | ICD-10-CM | POA: Insufficient documentation

## 2015-04-24 DIAGNOSIS — F419 Anxiety disorder, unspecified: Secondary | ICD-10-CM | POA: Insufficient documentation

## 2015-04-24 DIAGNOSIS — Z96652 Presence of left artificial knee joint: Secondary | ICD-10-CM

## 2015-04-24 DIAGNOSIS — Z9641 Presence of insulin pump (external) (internal): Secondary | ICD-10-CM | POA: Insufficient documentation

## 2015-04-24 HISTORY — DX: Unilateral primary osteoarthritis, left knee: M17.12

## 2015-04-24 HISTORY — PX: PARTIAL KNEE ARTHROPLASTY: SHX2174

## 2015-04-24 LAB — GLUCOSE, CAPILLARY
GLUCOSE-CAPILLARY: 212 mg/dL — AB (ref 65–99)
Glucose-Capillary: 90 mg/dL (ref 65–99)
Glucose-Capillary: 125 mg/dL — ABNORMAL HIGH (ref 65–99)

## 2015-04-24 SURGERY — ARTHROPLASTY, KNEE, UNICOMPARTMENTAL
Anesthesia: General | Site: Knee | Laterality: Left

## 2015-04-24 MED ORDER — INSULIN ASPART 100 UNIT/ML ~~LOC~~ SOLN
0.0000 [IU] | Freq: Three times a day (TID) | SUBCUTANEOUS | Status: DC
  Administered 2015-04-25: 25 [IU] via SUBCUTANEOUS

## 2015-04-24 MED ORDER — SCOPOLAMINE 1 MG/3DAYS TD PT72: 1.0000 | MEDICATED_PATCH | Freq: Once | TRANSDERMAL | Status: DC | PRN

## 2015-04-24 MED ORDER — PROPOFOL 500 MG/50ML IV EMUL
INTRAVENOUS | Status: AC
  Filled 2015-04-24: qty 50

## 2015-04-24 MED ORDER — GABAPENTIN 300 MG PO CAPS
300.0000 mg | ORAL_CAPSULE | Freq: Three times a day (TID) | ORAL | Status: DC
  Administered 2015-04-24 (×2): 300 mg via ORAL

## 2015-04-24 MED ORDER — ONDANSETRON HCL 4 MG/2ML IJ SOLN
INTRAMUSCULAR | Status: DC | PRN
  Administered 2015-04-24: 4 mg via INTRAVENOUS

## 2015-04-24 MED ORDER — INSULIN PUMP: SUBCUTANEOUS | Status: DC

## 2015-04-24 MED ORDER — ONDANSETRON HCL 4 MG/2ML IJ SOLN: 4.0000 mg | Freq: Once | INTRAMUSCULAR | Status: DC | PRN

## 2015-04-24 MED ORDER — HYDROMORPHONE HCL 1 MG/ML IJ SOLN
0.2500 mg | INTRAMUSCULAR | Status: DC | PRN
  Administered 2015-04-24 (×4): 0.5 mg via INTRAVENOUS

## 2015-04-24 MED ORDER — HYDROMORPHONE HCL 1 MG/ML IJ SOLN
0.5000 mg | INTRAMUSCULAR | Status: DC | PRN
  Administered 2015-04-24 – 2015-04-25 (×3): 1 mg via INTRAVENOUS
  Filled 2015-04-24 (×3): qty 1

## 2015-04-24 MED ORDER — SODIUM CHLORIDE 0.9 % IV SOLN
INTRAVENOUS | Status: DC
  Administered 2015-04-24: 11:00:00 via INTRAVENOUS

## 2015-04-24 MED ORDER — ONDANSETRON HCL 4 MG/2ML IJ SOLN: 4.0000 mg | Freq: Four times a day (QID) | INTRAMUSCULAR | Status: DC | PRN

## 2015-04-24 MED ORDER — ONDANSETRON HCL 4 MG PO TABS: 4.0000 mg | ORAL_TABLET | Freq: Three times a day (TID) | ORAL | Status: DC | PRN

## 2015-04-24 MED ORDER — RIVAROXABAN 10 MG PO TABS: 10.0000 mg | ORAL_TABLET | Freq: Every day | ORAL | Status: DC

## 2015-04-24 MED ORDER — DEXAMETHASONE SODIUM PHOSPHATE 10 MG/ML IJ SOLN
INTRAMUSCULAR | Status: AC
  Filled 2015-04-24: qty 1

## 2015-04-24 MED ORDER — VALSARTAN-HYDROCHLOROTHIAZIDE 80-12.5 MG PO TABS
1.0000 | ORAL_TABLET | Freq: Every day | ORAL | Status: DC
  Administered 2015-04-24: 1 via ORAL

## 2015-04-24 MED ORDER — ONDANSETRON HCL 4 MG PO TABS: 4.0000 mg | ORAL_TABLET | Freq: Four times a day (QID) | ORAL | Status: DC | PRN

## 2015-04-24 MED ORDER — ZOLPIDEM TARTRATE 5 MG PO TABS: 5.0000 mg | ORAL_TABLET | Freq: Every evening | ORAL | Status: DC | PRN

## 2015-04-24 MED ORDER — DOCUSATE SODIUM 100 MG PO CAPS
100.0000 mg | ORAL_CAPSULE | Freq: Two times a day (BID) | ORAL | Status: DC
  Administered 2015-04-24: 100 mg via ORAL
  Filled 2015-04-24: qty 1

## 2015-04-24 MED ORDER — BACLOFEN 10 MG PO TABS: 10.0000 mg | ORAL_TABLET | Freq: Three times a day (TID) | ORAL | Status: DC

## 2015-04-24 MED ORDER — MIDAZOLAM HCL 2 MG/2ML IJ SOLN
1.0000 mg | INTRAMUSCULAR | Status: DC | PRN
  Administered 2015-04-24: 2 mg via INTRAVENOUS
  Administered 2015-04-24: 1 mg via INTRAVENOUS

## 2015-04-24 MED ORDER — BUPROPION HCL ER (SR) 100 MG PO TB12: 200.0000 mg | ORAL_TABLET | Freq: Every day | ORAL | Status: DC

## 2015-04-24 MED ORDER — PROPOFOL 10 MG/ML IV BOLUS
INTRAVENOUS | Status: DC | PRN
  Administered 2015-04-24: 200 mg via INTRAVENOUS

## 2015-04-24 MED ORDER — PHENYLEPHRINE HCL 10 MG/ML IJ SOLN
INTRAMUSCULAR | Status: DC | PRN
  Administered 2015-04-24 (×4): 40 ug via INTRAVENOUS

## 2015-04-24 MED ORDER — BUPIVACAINE-EPINEPHRINE (PF) 0.5% -1:200000 IJ SOLN
INTRAMUSCULAR | Status: DC | PRN
  Administered 2015-04-24: 30 mL via PERINEURAL

## 2015-04-24 MED ORDER — FENTANYL CITRATE (PF) 100 MCG/2ML IJ SOLN
INTRAMUSCULAR | Status: AC
  Filled 2015-04-24: qty 2

## 2015-04-24 MED ORDER — HYDROMORPHONE HCL 1 MG/ML IJ SOLN
INTRAMUSCULAR | Status: AC
  Filled 2015-04-24: qty 1

## 2015-04-24 MED ORDER — LACTATED RINGERS IV SOLN
INTRAVENOUS | Status: DC
  Administered 2015-04-24 (×2): via INTRAVENOUS

## 2015-04-24 MED ORDER — LEVOTHYROXINE SODIUM 112 MCG PO TABS: 112.0000 ug | ORAL_TABLET | Freq: Every day | ORAL | Status: DC

## 2015-04-24 MED ORDER — METHOCARBAMOL 1000 MG/10ML IJ SOLN: 500.0000 mg | Freq: Four times a day (QID) | INTRAMUSCULAR | Status: DC | PRN

## 2015-04-24 MED ORDER — SENNA 8.6 MG PO TABS
1.0000 | ORAL_TABLET | Freq: Two times a day (BID) | ORAL | Status: DC
  Administered 2015-04-24: 8.6 mg via ORAL
  Filled 2015-04-24: qty 1

## 2015-04-24 MED ORDER — SENNA-DOCUSATE SODIUM 8.6-50 MG PO TABS: 2.0000 | ORAL_TABLET | Freq: Every day | ORAL | Status: DC

## 2015-04-24 MED ORDER — POLYETHYLENE GLYCOL 3350 17 G PO PACK: 17.0000 g | PACK | Freq: Every day | ORAL | Status: DC | PRN

## 2015-04-24 MED ORDER — LIDOCAINE HCL (CARDIAC) 20 MG/ML IV SOLN
INTRAVENOUS | Status: AC
  Filled 2015-04-24: qty 5

## 2015-04-24 MED ORDER — MIDAZOLAM HCL 2 MG/2ML IJ SOLN
INTRAMUSCULAR | Status: AC
  Filled 2015-04-24: qty 2

## 2015-04-24 MED ORDER — GLYCOPYRROLATE 0.2 MG/ML IJ SOLN: 0.2000 mg | Freq: Once | INTRAMUSCULAR | Status: DC | PRN

## 2015-04-24 MED ORDER — ARTIFICIAL TEARS OP OINT
TOPICAL_OINTMENT | OPHTHALMIC | Status: AC
  Filled 2015-04-24: qty 3.5

## 2015-04-24 MED ORDER — BISACODYL 10 MG RE SUPP: 10.0000 mg | Freq: Every day | RECTAL | Status: DC | PRN

## 2015-04-24 MED ORDER — DEXAMETHASONE SODIUM PHOSPHATE 10 MG/ML IJ SOLN
INTRAMUSCULAR | Status: DC | PRN
  Administered 2015-04-24: 6 mg via INTRAVENOUS

## 2015-04-24 MED ORDER — FENTANYL CITRATE (PF) 100 MCG/2ML IJ SOLN
50.0000 ug | INTRAMUSCULAR | Status: AC | PRN
  Administered 2015-04-24 (×2): 25 ug via INTRAVENOUS
  Administered 2015-04-24: 50 ug via INTRAVENOUS
  Administered 2015-04-24 (×4): 25 ug via INTRAVENOUS
  Administered 2015-04-24: 100 ug via INTRAVENOUS

## 2015-04-24 MED ORDER — MAGNESIUM CITRATE PO SOLN: 1.0000 | Freq: Once | ORAL | Status: DC | PRN

## 2015-04-24 MED ORDER — OXYCODONE-ACETAMINOPHEN 5-325 MG PO TABS
1.0000 | ORAL_TABLET | ORAL | Status: DC | PRN
  Administered 2015-04-24 (×2): 2 via ORAL
  Administered 2015-04-25: 1 via ORAL
  Filled 2015-04-24 (×3): qty 2

## 2015-04-24 MED ORDER — CEFAZOLIN SODIUM-DEXTROSE 2-3 GM-% IV SOLR
INTRAVENOUS | Status: AC
  Filled 2015-04-24: qty 50

## 2015-04-24 MED ORDER — CEFAZOLIN SODIUM-DEXTROSE 2-3 GM-% IV SOLR
2.0000 g | INTRAVENOUS | Status: AC
  Administered 2015-04-24: 2 g via INTRAVENOUS

## 2015-04-24 MED ORDER — BUPIVACAINE HCL (PF) 0.25 % IJ SOLN
INTRAMUSCULAR | Status: AC
  Filled 2015-04-24: qty 30

## 2015-04-24 MED ORDER — ESCITALOPRAM OXALATE 20 MG PO TABS
40.0000 mg | ORAL_TABLET | Freq: Every day | ORAL | Status: DC
  Administered 2015-04-24: 40 mg via ORAL

## 2015-04-24 MED ORDER — ATORVASTATIN CALCIUM 20 MG PO TABS
20.0000 mg | ORAL_TABLET | Freq: Every day | ORAL | Status: DC
  Administered 2015-04-24: 20 mg via ORAL

## 2015-04-24 MED ORDER — CEFAZOLIN SODIUM 1-5 GM-% IV SOLN
1.0000 g | Freq: Four times a day (QID) | INTRAVENOUS | Status: AC
  Administered 2015-04-24 – 2015-04-25 (×3): 1 g via INTRAVENOUS
  Filled 2015-04-24 (×3): qty 50

## 2015-04-24 MED ORDER — OXYCODONE HCL 5 MG PO TABS: 5.0000 mg | ORAL_TABLET | ORAL | Status: DC | PRN

## 2015-04-24 MED ORDER — LIDOCAINE HCL (CARDIAC) 20 MG/ML IV SOLN
INTRAVENOUS | Status: DC | PRN
  Administered 2015-04-24: 60 mg via INTRAVENOUS

## 2015-04-24 MED ORDER — MEPERIDINE HCL 25 MG/ML IJ SOLN: 6.2500 mg | INTRAMUSCULAR | Status: DC | PRN

## 2015-04-24 MED ORDER — ONDANSETRON HCL 4 MG/2ML IJ SOLN
INTRAMUSCULAR | Status: AC
  Filled 2015-04-24: qty 2

## 2015-04-24 MED ORDER — METOCLOPRAMIDE HCL 5 MG/ML IJ SOLN: 5.0000 mg | Freq: Three times a day (TID) | INTRAMUSCULAR | Status: DC | PRN

## 2015-04-24 MED ORDER — OXYCODONE-ACETAMINOPHEN 10-325 MG PO TABS: 1.0000 | ORAL_TABLET | Freq: Four times a day (QID) | ORAL | Status: DC | PRN

## 2015-04-24 MED ORDER — METHOCARBAMOL 500 MG PO TABS
500.0000 mg | ORAL_TABLET | Freq: Four times a day (QID) | ORAL | Status: DC | PRN
Start: 2015-04-24 — End: 2015-04-25
  Administered 2015-04-24 – 2015-04-25 (×2): 500 mg via ORAL
  Filled 2015-04-24 (×2): qty 1

## 2015-04-24 MED ORDER — BUPIVACAINE HCL (PF) 0.5 % IJ SOLN
INTRAMUSCULAR | Status: AC
  Filled 2015-04-24: qty 30

## 2015-04-24 MED ORDER — METOCLOPRAMIDE HCL 5 MG PO TABS: 5.0000 mg | ORAL_TABLET | Freq: Three times a day (TID) | ORAL | Status: DC | PRN

## 2015-04-24 SURGICAL SUPPLY — 69 items
BANDAGE ACE 6X5 VEL STRL LF (GAUZE/BANDAGES/DRESSINGS) ×2 IMPLANT
BANDAGE ESMARK 6X9 LF (GAUZE/BANDAGES/DRESSINGS) ×1 IMPLANT
BLADE SURG 10 STRL SS (BLADE) ×2 IMPLANT
BLADE SURG 15 STRL LF DISP TIS (BLADE) ×2 IMPLANT
BLADE SURG 15 STRL SS (BLADE) ×4
BNDG CMPR 9X6 STRL LF SNTH (GAUZE/BANDAGES/DRESSINGS) ×1
BNDG ESMARK 6X9 LF (GAUZE/BANDAGES/DRESSINGS) ×2
BOWL SMART MIX CTS (DISPOSABLE) ×2 IMPLANT
CANISTER SUCT 1200ML W/VALVE (MISCELLANEOUS) IMPLANT
CANISTER SUCTION 2500CC (MISCELLANEOUS) ×1 IMPLANT
CAPT KNEE PARTIAL 2 ×1 IMPLANT
CEMENT HV SMART SET (Cement) ×2 IMPLANT
CLSR STERI-STRIP ANTIMIC 1/2X4 (GAUZE/BANDAGES/DRESSINGS) ×2 IMPLANT
COVER BACK TABLE 60X90IN (DRAPES) ×2 IMPLANT
CUFF TOURNIQUET SINGLE 34IN LL (TOURNIQUET CUFF) IMPLANT
CUFF TOURNIQUET SINGLE 44IN (TOURNIQUET CUFF) ×1 IMPLANT
DECANTER SPIKE VIAL GLASS SM (MISCELLANEOUS) IMPLANT
DRAPE EXTREMITY T 121X128X90 (DRAPE) ×2 IMPLANT
DRAPE IMP U-DRAPE 54X76 (DRAPES) ×2 IMPLANT
DRAPE U-SHAPE 47X51 STRL (DRAPES) ×2 IMPLANT
DRSG PAD ABDOMINAL 8X10 ST (GAUZE/BANDAGES/DRESSINGS) ×1 IMPLANT
DURAPREP 26ML APPLICATOR (WOUND CARE) ×2 IMPLANT
ELECT REM PT RETURN 9FT ADLT (ELECTROSURGICAL) ×2
ELECTRODE REM PT RTRN 9FT ADLT (ELECTROSURGICAL) ×1 IMPLANT
FACESHIELD WRAPAROUND (MASK) ×6 IMPLANT
FACESHIELD WRAPAROUND OR TEAM (MASK) ×2 IMPLANT
GAUZE SPONGE 4X4 12PLY STRL (GAUZE/BANDAGES/DRESSINGS) ×2 IMPLANT
GLOVE BIO SURGEON STRL SZ7.5 (GLOVE) ×1 IMPLANT
GLOVE BIO SURGEON STRL SZ8 (GLOVE) ×2 IMPLANT
GLOVE BIOGEL PI IND STRL 7.0 (GLOVE) IMPLANT
GLOVE BIOGEL PI IND STRL 7.5 (GLOVE) IMPLANT
GLOVE BIOGEL PI IND STRL 8 (GLOVE) ×2 IMPLANT
GLOVE BIOGEL PI INDICATOR 7.0 (GLOVE) ×2
GLOVE BIOGEL PI INDICATOR 7.5 (GLOVE) ×1
GLOVE BIOGEL PI INDICATOR 8 (GLOVE) ×2
GLOVE ECLIPSE 6.5 STRL STRAW (GLOVE) ×2 IMPLANT
GLOVE ORTHO TXT STRL SZ7.5 (GLOVE) ×2 IMPLANT
GOWN STRL REUS W/ TWL LRG LVL3 (GOWN DISPOSABLE) ×1 IMPLANT
GOWN STRL REUS W/ TWL XL LVL3 (GOWN DISPOSABLE) ×2 IMPLANT
GOWN STRL REUS W/TWL LRG LVL3 (GOWN DISPOSABLE) ×2
GOWN STRL REUS W/TWL XL LVL3 (GOWN DISPOSABLE) ×6
HANDPIECE INTERPULSE COAX TIP (DISPOSABLE) ×2
IMMOBILIZER KNEE 22 UNIV (SOFTGOODS) ×2 IMPLANT
IMMOBILIZER KNEE 24 THIGH 36 (MISCELLANEOUS) ×1 IMPLANT
IMMOBILIZER KNEE 24 UNIV (MISCELLANEOUS)
KIT SAW BLADE (KITS) ×2 IMPLANT
MANIFOLD NEPTUNE II (INSTRUMENTS) ×2 IMPLANT
NS IRRIG 1000ML POUR BTL (IV SOLUTION) ×2 IMPLANT
PACK ARTHROSCOPY DSU (CUSTOM PROCEDURE TRAY) ×2 IMPLANT
PACK BASIN DAY SURGERY FS (CUSTOM PROCEDURE TRAY) ×2 IMPLANT
PENCIL BUTTON HOLSTER BLD 10FT (ELECTRODE) ×2 IMPLANT
SET HNDPC FAN SPRY TIP SCT (DISPOSABLE) ×1 IMPLANT
SHEET MEDIUM DRAPE 40X70 STRL (DRAPES) ×2 IMPLANT
SLEEVE SCD COMPRESS KNEE MED (MISCELLANEOUS) ×2 IMPLANT
SPONGE LAP 18X18 X RAY DECT (DISPOSABLE) ×2 IMPLANT
SUCTION FRAZIER HANDLE 10FR (MISCELLANEOUS) ×1
SUCTION TUBE FRAZIER 10FR DISP (MISCELLANEOUS) ×1 IMPLANT
SUT MNCRL AB 4-0 PS2 18 (SUTURE) IMPLANT
SUT VIC AB 0 CT1 27 (SUTURE) ×4
SUT VIC AB 0 CT1 27XBRD ANBCTR (SUTURE) ×1 IMPLANT
SUT VIC AB 2-0 SH 27 (SUTURE) ×2
SUT VIC AB 2-0 SH 27XBRD (SUTURE) ×1 IMPLANT
SUT VICRYL 3-0 CR8 SH (SUTURE) ×2 IMPLANT
SUT VICRYL 4-0 PS2 18IN ABS (SUTURE) IMPLANT
SYR BULB IRRIGATION 50ML (SYRINGE) ×2 IMPLANT
TOWEL OR 17X24 6PK STRL BLUE (TOWEL DISPOSABLE) ×2 IMPLANT
TOWEL OR NON WOVEN STRL DISP B (DISPOSABLE) ×4 IMPLANT
TUBE CONNECTING 20X1/4 (TUBING) IMPLANT
YANKAUER SUCT BULB TIP NO VENT (SUCTIONS) ×1 IMPLANT

## 2015-04-24 NOTE — Anesthesia Preprocedure Evaluation (Addendum)
Anesthesia Evaluation  Patient identified by MRN, date of birth, ID band Patient awake    Reviewed: Allergy & Precautions, NPO status , Patient's Chart, lab work & pertinent test results  Airway Mallampati: I  TM Distance: >3 FB Neck ROM: Full    Dental   Pulmonary sleep apnea and Continuous Positive Airway Pressure Ventilation , Current Smoker,    Pulmonary exam normal        Cardiovascular hypertension, Pt. on medications + CAD  Normal cardiovascular exam     Neuro/Psych PSYCHIATRIC DISORDERS Anxiety Depression    GI/Hepatic   Endo/Other  diabetes, Type 1, Insulin DependentHypothyroidism   Renal/GU      Musculoskeletal  (+) Arthritis ,   Abdominal   Peds  Hematology   Anesthesia Other Findings   Reproductive/Obstetrics                            Anesthesia Physical Anesthesia Plan  ASA: III  Anesthesia Plan: General   Post-op Pain Management: GA combined w/ Regional for post-op pain   Induction: Intravenous  Airway Management Planned: LMA  Additional Equipment:   Intra-op Plan:   Post-operative Plan: Extubation in OR  Informed Consent: I have reviewed the patients History and Physical, chart, labs and discussed the procedure including the risks, benefits and alternatives for the proposed anesthesia with the patient or authorized representative who has indicated his/her understanding and acceptance.     Plan Discussed with: CRNA and Surgeon  Anesthesia Plan Comments:         Anesthesia Quick Evaluation

## 2015-04-24 NOTE — Anesthesia Postprocedure Evaluation (Signed)
Anesthesia Post Note  Patient: [name]  Procedure(s) Performed: Procedure(s) (LRB): LEFT UNICOMPARTMENTAL KNEE ARTHROPLASTY  (Left)  Patient location during evaluation: PACU Anesthesia Type: General Level of consciousness: awake and alert Pain management: pain level controlled Vital Signs Assessment: post-procedure vital signs reviewed and stable Respiratory status: spontaneous breathing, nonlabored ventilation, respiratory function stable and patient connected to nasal cannula oxygen Cardiovascular status: blood pressure returned to baseline and stable Postop Assessment: no signs of nausea or vomiting Anesthetic complications: no    Last Vitals:  Filed Vitals:   04/24/15 1055 04/24/15 1100  BP:  112/54  Pulse: 99 97  Temp:    Resp: 15 15    Last Pain:  Filed Vitals:   04/24/15 1102  PainSc: 4                  Riven Beebe DAVID

## 2015-04-24 NOTE — H&P (Signed)
PREOPERATIVE H&P  Chief Complaint: UNILATERAL PRIMARY OSTEOARTHRITIS LEFT KNEE   HPI: [name] is a 60 y.o. female who presents for preoperative history and physical with a diagnosis of UNILATERAL PRIMARY OSTEOARTHRITIS LEFT KNEE . Symptoms are rated as moderate to severe, and have been worsening.  This is significantly impairing activities of daily living.  She has elected for surgical management.   She has failed injections, activity modification, anti-inflammatories, and assistive devices.  Preoperative X-rays demonstrate end stage degenerative changes with osteophyte formation, loss of joint space, subchondral sclerosis.   Past Medical History  Diagnosis Date  . Type 2 diabetes mellitus (HCC)     insulin pump  . Hypertension   . OSA on CPAP     AHI = 43 (per patient)  . History of nuclear stress test 12/16/2011    exercise myoview; normal images with 2-59mm ST-segment depression - subsequent cath revelaed subtotally occluded small 2nd marginal branch & 75% PDA lesion, normal LV function  . Hypothyroidism   . Coronary artery disease   . Tobacco abuse   . Depression   . Anxiety   . Neuropathy ([place])     in feet  . Arthritis     knees  . Fatty liver   . Personal history of kidney stones    Past Surgical History  Procedure Laterality Date  . Cardiac catheterization  01/04/2012    subtotally occluded small 2nd marginal branch & 75% PDA lesion, normal LV function  . Cholecystectomy  2008  . Cataract extraction Bilateral     with lens implants  . Breast enhancement surgery Bilateral   . Left heart catheterization with coronary angiogram N/A 01/04/2012    Procedure: LEFT HEART CATHETERIZATION WITH CORONARY ANGIOGRAM;  Surgeon: Lorretta Harp, MD;  Location: [facility] CATH LAB;  Service: Cardiovascular;  Laterality: N/A;  . Colonoscopy    . Posterior cervical fusion/foraminotomy    . Amputation Right 12/22/2014    Procedure: RIGHT 2ND TOE AMPUTATION;  Surgeon: [name],  MD;  Location: [place];  Service: Orthopedics;  Laterality: Right;  . Robotic assisted total hysterectomy with bilateral salpingo oopherectomy Bilateral 02/17/2015    Procedure: ROBOTIC ASSISTED TOTAL HYSTERECTOMY WITH BILATERAL SALPINGO OOPHORECTOMY;  Surgeon: Everitt Amber, MD;  Location: [facility];  Service: Gynecology;  Laterality: Bilateral;   Social History   Social History  . Marital Status: Married    Spouse Name: N/A  . Number of Children: N/A  . Years of Education: 14   Occupational History  . respiratory therapist Other    [employer]   Social History Main Topics  . Smoking status: Current Some Day Smoker -- 0.25 packs/day for 25 years    Types: Cigarettes    Last Attempt to Quit: 12/15/2014  . Smokeless tobacco: Never Used  . Alcohol Use: No  . Drug Use: No  . Sexual Activity: Not Asked   Other Topics Concern  . None   Social History Narrative   Family History  Problem Relation Age of Onset  . Stroke Mother   . Hypertension Mother   . Diabetes Mother   . Alzheimer's disease Mother   . Diabetes Father   . COPD Father     vent-dependent, MODS  . Hypertension Sister   . Mental illness Sister     borderline personality d/o  . Mental illness Sister     schizoeffective d/o  . Diabetes Sister    No Known Allergies Prior to Admission medications   Medication Sig  Start Date End Date Taking? Authorizing Provider  atorvastatin (LIPITOR) 20 MG tablet Take 20 mg by mouth daily. 10/12/14  Yes Historical Provider, MD  buPROPion (WELLBUTRIN SR) 200 MG 12 hr tablet Take 200 mg by mouth daily.  08/26/13  Yes Historical Provider, MD  diclofenac sodium (VOLTAREN) 1 % GEL 4 (four) times daily as needed.   Yes Historical Provider, MD  escitalopram (LEXAPRO) 20 MG tablet Take 40 mg by mouth daily.   Yes Historical Provider, MD  gabapentin (NEURONTIN) 300 MG capsule Take 300 mg by mouth 3 (three) times daily. 10/12/14  Yes Historical Provider, MD  HUMALOG 100 UNIT/ML  injection  12/19/14  Yes Historical Provider, MD  Insulin Human (INSULIN PUMP) SOLN Inject into the skin continuous. Humalog - basal rate 2.75/hr   Yes Historical Provider, MD  levothyroxine (SYNTHROID, LEVOTHROID) 112 MCG tablet Take 112 mcg by mouth daily before breakfast. 10/12/14  Yes Historical Provider, MD  valsartan-hydrochlorothiazide (DIOVAN-HCT) 80-12.5 MG per tablet Take 1 tablet by mouth daily.   Yes Historical Provider, MD  ACCU-CHEK AVIVA PLUS test strip  12/19/14   Historical Provider, MD  diclofenac (VOLTAREN) 75 MG EC tablet Take 1 tablet by mouth 2 (two) times daily. Take 1 tab by mouth twice a day 03/20/15   Historical Provider, MD     Positive ROS: All other systems have been reviewed and were otherwise negative with the exception of those mentioned in the HPI and as above.  Physical Exam: General: Alert, no acute distress Cardiovascular: No pedal edema Respiratory: No cyanosis, no use of accessory musculature GI: No organomegaly, abdomen is soft and non-tender Skin: No lesions in the area of chief complaint Neurologic: mild subjective decreased peripheral sensation Psychiatric: Patient is competent for consent with normal mood and affect Lymphatic: No axillary or cervical lymphadenopathy  MUSCULOSKELETAL: Left knee range of motion is 0-125 of intact stability to Lachman testing with mild medial crepitance and pseudo-laxity.  Assessment: UNILATERAL PRIMARY OSTEOARTHRITIS LEFT KNEE    Plan: Plan for Procedure(s): LEFT UNICOMPARTMENTAL KNEE ARTHROPLASTY   The risks benefits and alternatives were discussed with the patient including but not limited to the risks of nonoperative treatment, versus surgical intervention including infection, bleeding, nerve injury,  blood clots, cardiopulmonary complications, morbidity, mortality, among others, and they were willing to proceed.   Johnny Bridge, MD Cell [phone]   04/24/2015 7:18 AM

## 2015-04-24 NOTE — Transfer of Care (Signed)
Immediate Anesthesia Transfer of Care Note  Patient: [name]  Procedure(s) Performed: Procedure(s): LEFT UNICOMPARTMENTAL KNEE ARTHROPLASTY  (Left)  Patient Location: PACU  Anesthesia Type:GA combined with regional for post-op pain  Level of Consciousness: awake and patient cooperative  Airway & Oxygen Therapy: Patient Spontanous Breathing and Patient connected to face mask oxygen  Post-op Assessment: Report given to RN and Post -op Vital signs reviewed and stable  Post vital signs: Reviewed and stable  Last Vitals:  Filed Vitals:   04/24/15 0725 04/24/15 0727  BP: 103/64   Pulse: 65 66  Temp:    Resp: 15 18    Complications: No apparent anesthesia complications

## 2015-04-24 NOTE — Progress Notes (Signed)
AssistedDr. Ossey with left, ultrasound guided, adductor canal block. Side rails up, monitors on throughout procedure. See vital signs in flow sheet. Tolerated Procedure well.  

## 2015-04-24 NOTE — Discharge Instructions (Signed)
INSTRUCTIONS AFTER JOINT REPLACEMENT  ° °o Remove items at home which could result in a fall. This includes throw rugs or furniture in walking pathways °o ICE to the affected joint every three hours while awake for 30 minutes at a time, for at least the first 3-5 days, and then as needed for pain and swelling.  Continue to use ice for pain and swelling. You may notice swelling that will progress down to the foot and ankle.  This is normal after surgery.  Elevate your leg when you are not up walking on it.   °o Continue to use the breathing machine you got in the hospital (incentive spirometer) which will help keep your temperature down.  It is common for your temperature to cycle up and down following surgery, especially at night when you are not up moving around and exerting yourself.  The breathing machine keeps your lungs expanded and your temperature down. ° ° °DIET:  As you were doing prior to hospitalization, we recommend a well-balanced diet. ° °DRESSING / WOUND CARE / SHOWERING ° °You may change your dressing 3-5 days after surgery.  Then change the dressing every day with sterile gauze.  Please use good hand washing techniques before changing the dressing.  Do not use any lotions or creams on the incision until instructed by your surgeon. ° °ACTIVITY ° °o Increase activity slowly as tolerated, but follow the weight bearing instructions below.   °o No driving for 6 weeks or until further direction given by your physician.  You cannot drive while taking narcotics.  °o No lifting or carrying greater than 10 lbs. until further directed by your surgeon. °o Avoid periods of inactivity such as sitting longer than an hour when not asleep. This helps prevent blood clots.  °o You may return to work once you are authorized by your doctor.  ° ° ° °WEIGHT BEARING  ° °Weight bearing as tolerated with assist device (walker, cane, etc) as directed, use it as long as suggested by your surgeon or therapist, typically at  least 4-6 weeks. ° ° °EXERCISES ° °Results after joint replacement surgery are often greatly improved when you follow the exercise, range of motion and muscle strengthening exercises prescribed by your doctor. Safety measures are also important to protect the joint from further injury. Any time any of these exercises cause you to have increased pain or swelling, decrease what you are doing until you are comfortable again and then slowly increase them. If you have problems or questions, call your caregiver or physical therapist for advice.  ° °Rehabilitation is important following a joint replacement. After just a few days of immobilization, the muscles of the leg can become weakened and shrink (atrophy).  These exercises are designed to build up the tone and strength of the thigh and leg muscles and to improve motion. Often times heat used for twenty to thirty minutes before working out will loosen up your tissues and help with improving the range of motion but do not use heat for the first two weeks following surgery (sometimes heat can increase post-operative swelling).  ° °These exercises can be done on a training (exercise) mat, on the floor, on a table or on a bed. Use whatever works the best and is most comfortable for you.    Use music or television while you are exercising so that the exercises are a pleasant break in your day. This will make your life better with the exercises acting as a break   in your routine that you can look forward to.   Perform all exercises about fifteen times, three times per day or as directed.  You should exercise both the operative leg and the other leg as well. ° °Exercises include: °  °• Quad Sets - Tighten up the muscle on the front of the thigh (Quad) and hold for 5-10 seconds.   °• Straight Leg Raises - With your knee straight (if you were given a brace, keep it on), lift the leg to 60 degrees, hold for 3 seconds, and slowly lower the leg.  Perform this exercise against  resistance later as your leg gets stronger.  °• Leg Slides: Lying on your back, slowly slide your foot toward your buttocks, bending your knee up off the floor (only go as far as is comfortable). Then slowly slide your foot back down until your leg is flat on the floor again.  °• Angel Wings: Lying on your back spread your legs to the side as far apart as you can without causing discomfort.  °• Hamstring Strength:  Lying on your back, push your heel against the floor with your leg straight by tightening up the muscles of your buttocks.  Repeat, but this time bend your knee to a comfortable angle, and push your heel against the floor.  You may put a pillow under the heel to make it more comfortable if necessary.  ° °A rehabilitation program following joint replacement surgery can speed recovery and prevent re-injury in the future due to weakened muscles. Contact your doctor or a physical therapist for more information on knee rehabilitation.  ° ° °CONSTIPATION ° °Constipation is defined medically as fewer than three stools per week and severe constipation as less than one stool per week.  Even if you have a regular bowel pattern at home, your normal regimen is likely to be disrupted due to multiple reasons following surgery.  Combination of anesthesia, postoperative narcotics, change in appetite and fluid intake all can affect your bowels.  ° °YOU MUST use at least one of the following options; they are listed in order of increasing strength to get the job done.  They are all available over the counter, and you may need to use some, POSSIBLY even all of these options:   ° °Drink plenty of fluids (prune juice may be helpful) and high fiber foods °Colace 100 mg by mouth twice a day  °Senokot for constipation as directed and as needed Dulcolax (bisacodyl), take with full glass of water  °Miralax (polyethylene glycol) once or twice a day as needed. ° °If you have tried all these things and are unable to have a bowel  movement in the first 3-4 days after surgery call either your surgeon or your primary doctor.   ° °If you experience loose stools or diarrhea, hold the medications until you stool forms back up.  If your symptoms do not get better within 1 week or if they get worse, check with your doctor.  If you experience "the worst abdominal pain ever" or develop nausea or vomiting, please contact the office immediately for further recommendations for treatment. ° ° °ITCHING:  If you experience itching with your medications, try taking only a single pain pill, or even half a pain pill at a time.  You can also use Benadryl over the counter for itching or also to help with sleep.  ° °TED HOSE STOCKINGS:  Use stockings on both legs until for at least 2 weeks or as   directed by physician office. They may be removed at night for sleeping.  MEDICATIONS:  See your medication summary on the After Visit Summary that nursing will review with you.  You may have some home medications which will be placed on hold until you complete the course of blood thinner medication.  It is important for you to complete the blood thinner medication as prescribed.  PRECAUTIONS:  If you experience chest pain or shortness of breath - call 911 immediately for transfer to the hospital emergency department.   If you develop a fever greater that 101 F, purulent drainage from wound, increased redness or drainage from wound, foul odor from the wound/dressing, or calf pain - CONTACT YOUR SURGEON.                                                   FOLLOW-UP APPOINTMENTS:  If you do not already have a post-op appointment, please call the office for an appointment to be seen by your surgeon.  Guidelines for how soon to be seen are listed in your After Visit Summary, but are typically between 1-4 weeks after surgery.  OTHER INSTRUCTIONS:   Knee Replacement:  Do not place pillow under knee, focus on keeping the knee straight while resting.   MAKE SURE  YOU:   Understand these instructions.   Get help right away if you are not doing well or get worse.    Thank you for letting us be a part of your medical care team.  It is a privilege we respect greatly.  We hope these instructions will help you stay on track for a fast and full recovery!    Post Anesthesia Home Care Instructions  Activity: Get plenty of rest for the remainder of the day. A responsible adult should stay with you for 24 hours following the procedure.  For the next 24 hours, DO NOT: -Drive a car -Paediatric nurse -Drink alcoholic beverages -Take any medication unless instructed by your physician -Make any legal decisions or sign important papers.  Meals: Start with liquid foods such as gelatin or soup. Progress to regular foods as tolerated. Avoid greasy, spicy, heavy foods. If nausea and/or vomiting occur, drink only clear liquids until the nausea and/or vomiting subsides. Call your physician if vomiting continues.  Special Instructions/Symptoms: Your throat may feel dry or sore from the anesthesia or the breathing tube placed in your throat during surgery. If this causes discomfort, gargle with warm salt water. The discomfort should disappear within 24 hours.  If you had a scopolamine patch placed behind your ear for the management of post- operative nausea and/or vomiting:  1. The medication in the patch is effective for 72 hours, after which it should be removed.  Wrap patch in a tissue and discard in the trash. Wash hands thoroughly with soap and water. 2. You may remove the patch earlier than 72 hours if you experience unpleasant side effects which may include dry mouth, dizziness or visual disturbances. 3. Avoid touching the patch. Wash your hands with soap and water after contact with the patch.

## 2015-04-24 NOTE — Anesthesia Procedure Notes (Addendum)
Procedure Name: LMA Insertion Date/Time: 04/24/2015 7:38 AM Performed by: BLOCKER, TIMOTHY D Pre-anesthesia Checklist: Patient identified, Emergency Drugs available, Suction available and Patient being monitored Patient Re-evaluated:Patient Re-evaluated prior to inductionOxygen Delivery Method: Circle System Utilized Preoxygenation: Pre-oxygenation with 100% oxygen Intubation Type: IV induction Ventilation: Mask ventilation without difficulty LMA: LMA inserted LMA Size: 4.0 Number of attempts: 1 Airway Equipment and Method: Bite block Placement Confirmation: positive ETCO2 Tube secured with: Tape Dental Injury: Teeth and Oropharynx as per pre-operative assessment    Anesthesia Regional Block:  Adductor canal block  Pre-Anesthetic Checklist: ,, timeout performed, Correct Patient, Correct Site, Correct Laterality, Correct Procedure, Correct Position, site marked, Risks and benefits discussed,  Surgical consent,  Pre-op evaluation,  At surgeon's request and post-op pain management  Laterality: Left  Prep: chloraprep       Needles:  Injection technique: Single-shot  Needle Type: Echogenic Stimulator Needle     Needle Length: 9cm 9 cm Needle Gauge: 21 and 21 G    Additional Needles:  Procedures: ultrasound guided (picture in chart) Adductor canal block Narrative:  Start time: 04/24/2015 7:15 AM End time: 04/24/2015 7:25 AM Injection made incrementally with aspirations every 5 mL.  Performed by: Personally  Anesthesiologist: [name]  Additional Notes: Monitors applied. Patient sedated. Sterile prep and drape,hand hygiene and sterile gloves were used. Relevant anatomy identified.Needle position confirmed.Local anesthetic injected incrementally after negative aspiration. Local anesthetic spread visualized around nerve(s). Vascular puncture avoided. No complications. Image printed for medical record.The patient tolerated the procedure well.    [name] MD

## 2015-04-24 NOTE — Op Note (Signed)
04/24/2015  9:22 AM  PATIENT:  [name]    PRE-OPERATIVE DIAGNOSIS:  UNILATERAL PRIMARY OSTEOARTHRITIS LEFT KNEE   POST-OPERATIVE DIAGNOSIS:  Same  PROCEDURE:  LEFT UNICOMPARTMENTAL KNEE ARTHROPLASTY   SURGEON:  Johnny Bridge, MD  PHYSICIAN ASSISTANT: Joya Gaskins, OPA-C, present and scrubbed throughout the case, critical for completion in a timely fashion, and for retraction, instrumentation, and closure.  ANESTHESIA:   General  PREOPERATIVE INDICATIONS:  [name] is a  60 y.o. female with a diagnosis of UNILATERAL PRIMARY OSTEOARTHRITIS LEFT KNEE  who failed conservative measures and elected for surgical management.    The risks benefits and alternatives were discussed with the patient preoperatively including but not limited to the risks of infection, bleeding, nerve injury, cardiopulmonary complications, blood clots, the need for revision surgery, among others, and the patient was willing to proceed.  OPERATIVE IMPLANTS: Biomet Oxford mobile bearing medial compartment arthroplasty femur size small, tibia size C, bearing size 5.  OPERATIVE FINDINGS: Endstage grade 4 medial compartment osteoarthritis. She had some grade 2 changes on the undersurface of the patella medially, the femoral trochlea was intact, there were minimal osteophyte formation, the lateral compartment was intact. The ACL was intact. There was a fairly sizable cyst underneath the tibia on the medial aspect that was irregular, and because of the compromised metaphyseal bone I was between a size B and C on the tibia, but went with a size C to ensure cortical contact.  OPERATIVE PROCEDURE: The patient was brought to the operating room placed in supine position. General anesthesia was administered. IV antibiotics were given. The lower extremity was placed in the legholder and prepped and draped in usual sterile fashion.  Time out was performed.  The leg was elevated and exsanguinated and the tourniquet  was inflated. Anteromedial incision was performed, and I took care to preserve the MCL. Parapatellar incision was carried out, and the osteophytes were excised, along with the medial meniscus and a small portion of the fat pad.  The extra medullary tibial cutting jig was applied, using the spoon and the 39mm G-Clamp and the 2 mm shim, and I took care to protect the anterior cruciate ligament insertion and the tibial spine. The medial collateral ligament was also protected, and I resected my proximal tibia, matching the anatomic slope. The tibial piece fractured, due to the subcortical cyst within the metaphyseal bone, and I had to remove the tibia in 2 pieces.  The proximal tibial bony cut was removed and I turned my attention to the femur.  The intramedullary femoral rod was placed using the drill, and then using the appropriate reference, I assembled the femoral jig, setting my posterior cutting block. I resected my posterior femur, used the 0 spigot for the anterior femur, and then measured my gap.   I then used the appropriate mill to match the extension gap to the flexion gap. The second milling was at a 4 and then again at a 5.  The gaps were then measured again with the appropriate feeler gauges. Once I had balanced flexion and extension gaps, I then completed the preparation of the femur.  I milled off the anterior aspect of the distal femur to prevent impingement. I also exposed the tibia, and selected the above-named component, and then used the cutting jig to prepare the keel slot on the tibia. I also used the awl to curette out the bone to complete the preparation of the keel. The back wall was intact.  I then placed trial  components, and it was found to have excellent motion, and appropriate balance.  I then cemented the components into place, cementing the tibia first, removing all excess cement, and then cementing the femur.  All loose cement was removed.  The real polyethylene insert  was applied manually, and the knee was taken through functional range of motion, and found to have excellent stability and restoration of joint motion, with excellent balance.  The wounds were irrigated copiously, and the parapatellar tissue closed with Vicryl, followed by Vicryl for the subcutaneous tissue, with routine closure with Steri-Strips and sterile gauze.  The tourniquet was released, and the patient was awakened and extubated and returned to PACU in stable and satisfactory condition. There were no complications.

## 2015-04-25 DIAGNOSIS — M1712 Unilateral primary osteoarthritis, left knee: Secondary | ICD-10-CM | POA: Diagnosis not present

## 2015-04-25 LAB — GLUCOSE, CAPILLARY: Glucose-Capillary: 373 mg/dL — ABNORMAL HIGH (ref 65–99)

## 2015-04-27 ENCOUNTER — Encounter (HOSPITAL_BASED_OUTPATIENT_CLINIC_OR_DEPARTMENT_OTHER): Payer: Self-pay | Admitting: Orthopedic Surgery

## 2015-05-07 ENCOUNTER — Other Ambulatory Visit: Payer: Self-pay

## 2015-05-07 DIAGNOSIS — Z1231 Encounter for screening mammogram for malignant neoplasm of breast: Secondary | ICD-10-CM

## 2015-05-13 ENCOUNTER — Inpatient Hospital Stay (HOSPITAL_COMMUNITY): Payer: 59 | Admitting: Certified Registered Nurse Anesthetist

## 2015-05-13 ENCOUNTER — Observation Stay (HOSPITAL_COMMUNITY)
Admission: AD | Admit: 2015-05-13 | Discharge: 2015-05-15 | DRG: 464 | Disposition: A | Discharge status: Home Health | Payer: 59 | Source: Ambulatory Visit | Attending: Orthopedic Surgery | Admitting: Orthopedic Surgery

## 2015-05-13 ENCOUNTER — Other Ambulatory Visit: Payer: Self-pay | Admitting: Orthopedic Surgery

## 2015-05-13 ENCOUNTER — Encounter (HOSPITAL_COMMUNITY)
Admission: AD | Disposition: A | Discharge status: Home Health | Payer: Self-pay | Source: Ambulatory Visit | Attending: Orthopedic Surgery

## 2015-05-13 ENCOUNTER — Encounter (HOSPITAL_COMMUNITY): Payer: Self-pay | Admitting: Certified Registered Nurse Anesthetist

## 2015-05-13 DIAGNOSIS — Z9841 Cataract extraction status, right eye: Secondary | ICD-10-CM | POA: Insufficient documentation

## 2015-05-13 DIAGNOSIS — Z89421 Acquired absence of other right toe(s): Secondary | ICD-10-CM | POA: Insufficient documentation

## 2015-05-13 DIAGNOSIS — Y831 Surgical operation with implant of artificial internal device as the cause of abnormal reaction of the patient, or of later complication, without mention of misadventure at the time of the procedure: Secondary | ICD-10-CM | POA: Insufficient documentation

## 2015-05-13 DIAGNOSIS — Z9842 Cataract extraction status, left eye: Secondary | ICD-10-CM | POA: Diagnosis not present

## 2015-05-13 DIAGNOSIS — Z6838 Body mass index (BMI) 38.0-38.9, adult: Secondary | ICD-10-CM | POA: Diagnosis not present

## 2015-05-13 DIAGNOSIS — I2510 Atherosclerotic heart disease of native coronary artery without angina pectoris: Secondary | ICD-10-CM | POA: Diagnosis not present

## 2015-05-13 DIAGNOSIS — E039 Hypothyroidism, unspecified: Secondary | ICD-10-CM | POA: Insufficient documentation

## 2015-05-13 DIAGNOSIS — T8131XA Disruption of external operation (surgical) wound, not elsewhere classified, initial encounter: Secondary | ICD-10-CM | POA: Insufficient documentation

## 2015-05-13 DIAGNOSIS — Z7901 Long term (current) use of anticoagulants: Secondary | ICD-10-CM | POA: Diagnosis not present

## 2015-05-13 DIAGNOSIS — E662 Morbid (severe) obesity with alveolar hypoventilation: Secondary | ICD-10-CM | POA: Diagnosis not present

## 2015-05-13 DIAGNOSIS — T8454XA Infection and inflammatory reaction due to internal left knee prosthesis, initial encounter: Principal | ICD-10-CM

## 2015-05-13 DIAGNOSIS — F419 Anxiety disorder, unspecified: Secondary | ICD-10-CM | POA: Diagnosis not present

## 2015-05-13 DIAGNOSIS — Z794 Long term (current) use of insulin: Secondary | ICD-10-CM | POA: Insufficient documentation

## 2015-05-13 DIAGNOSIS — Z9641 Presence of insulin pump (external) (internal): Secondary | ICD-10-CM | POA: Insufficient documentation

## 2015-05-13 DIAGNOSIS — E1140 Type 2 diabetes mellitus with diabetic neuropathy, unspecified: Secondary | ICD-10-CM | POA: Insufficient documentation

## 2015-05-13 DIAGNOSIS — I10 Essential (primary) hypertension: Secondary | ICD-10-CM | POA: Insufficient documentation

## 2015-05-13 DIAGNOSIS — Z961 Presence of intraocular lens: Secondary | ICD-10-CM | POA: Insufficient documentation

## 2015-05-13 DIAGNOSIS — F329 Major depressive disorder, single episode, unspecified: Secondary | ICD-10-CM | POA: Diagnosis not present

## 2015-05-13 DIAGNOSIS — Z981 Arthrodesis status: Secondary | ICD-10-CM | POA: Insufficient documentation

## 2015-05-13 DIAGNOSIS — Z79899 Other long term (current) drug therapy: Secondary | ICD-10-CM | POA: Diagnosis not present

## 2015-05-13 DIAGNOSIS — W19XXXA Unspecified fall, initial encounter: Secondary | ICD-10-CM | POA: Insufficient documentation

## 2015-05-13 DIAGNOSIS — F17210 Nicotine dependence, cigarettes, uncomplicated: Secondary | ICD-10-CM | POA: Diagnosis not present

## 2015-05-13 DIAGNOSIS — M25562 Pain in left knee: Secondary | ICD-10-CM | POA: Diagnosis present

## 2015-05-13 HISTORY — PX: I & D KNEE WITH POLY EXCHANGE: SHX5024

## 2015-05-13 LAB — BASIC METABOLIC PANEL
ANION GAP: 11 (ref 5–15)
BUN: 20 mg/dL (ref 6–20)
CHLORIDE: 105 mmol/L (ref 101–111)
CO2: 21 mmol/L — AB (ref 22–32)
CREATININE: 0.68 mg/dL (ref 0.44–1.00)
Calcium: 10.1 mg/dL (ref 8.9–10.3)
GFR calc Af Amer: >60 mL/min (ref >60)
GFR calc non Af Amer: >60 mL/min (ref >60)
GLUCOSE: 139 mg/dL — AB (ref 65–99)
POTASSIUM: 4.9 mmol/L (ref 3.5–5.1)
Sodium: 137 mmol/L (ref 135–145)

## 2015-05-13 LAB — GLUCOSE, CAPILLARY
GLUCOSE-CAPILLARY: 109 mg/dL — AB (ref 65–99)
GLUCOSE-CAPILLARY: 124 mg/dL — AB (ref 65–99)
Glucose-Capillary: 138 mg/dL — ABNORMAL HIGH (ref 65–99)
Glucose-Capillary: 141 mg/dL — ABNORMAL HIGH (ref 65–99)
Glucose-Capillary: 135 mg/dL — ABNORMAL HIGH (ref 65–99)
Glucose-Capillary: 147 mg/dL — ABNORMAL HIGH (ref 65–99)

## 2015-05-13 LAB — SYNOVIAL CELL COUNT + DIFF, W/ CRYSTALS
CRYSTALS FLUID: NONE SEEN
EOSINOPHILS-SYNOVIAL: 0 % (ref 0–1)
Lymphocytes-Synovial Fld: 27 % — ABNORMAL HIGH (ref 0–20)
Monocyte-Macrophage-Synovial Fluid: 13 % — ABNORMAL LOW (ref 50–90)
NEUTROPHIL, SYNOVIAL: 60 % — AB (ref 0–25)
WBC, SYNOVIAL: 813 /mm3 — AB (ref 0–200)

## 2015-05-13 LAB — CBC
HCT: 41.3 % (ref 36.0–46.0)
Hemoglobin: 13.2 g/dL (ref 12.0–15.0)
MCH: 29.9 pg (ref 26.0–34.0)
MCHC: 32 g/dL (ref 30.0–36.0)
MCV: 93.7 fL (ref 78.0–100.0)
PLATELETS: 291 10*3/uL (ref 150–400)
RBC: 4.41 MIL/uL (ref 3.87–5.11)
RDW: 14.4 % (ref 11.5–15.5)
WBC: 10.8 10*3/uL — ABNORMAL HIGH (ref 4.0–10.5)

## 2015-05-13 LAB — PROTIME-INR
INR: 0.87 (ref 0.00–1.49)
Prothrombin Time: 12 seconds (ref 11.6–15.2)

## 2015-05-13 SURGERY — IRRIGATION AND DEBRIDEMENT KNEE WITH POLY EXCHANGE
Anesthesia: General | Site: Knee | Laterality: Left

## 2015-05-13 MED ORDER — HYDROMORPHONE HCL 1 MG/ML IJ SOLN
INTRAMUSCULAR | Status: AC
  Administered 2015-05-13: 0.5 mg via INTRAVENOUS
  Filled 2015-05-13: qty 1

## 2015-05-13 MED ORDER — LEVOTHYROXINE SODIUM 112 MCG PO TABS
112.0000 ug | ORAL_TABLET | Freq: Every day | ORAL | Status: DC
  Administered 2015-05-14 – 2015-05-15 (×2): 112 ug via ORAL
  Filled 2015-05-13 (×2): qty 1

## 2015-05-13 MED ORDER — ALPRAZOLAM 0.5 MG PO TABS
0.5000 mg | ORAL_TABLET | Freq: Three times a day (TID) | ORAL | Status: DC | PRN
  Administered 2015-05-13: 0.5 mg via ORAL
  Filled 2015-05-13: qty 1

## 2015-05-13 MED ORDER — PROMETHAZINE HCL 25 MG/ML IJ SOLN: 6.2500 mg | INTRAMUSCULAR | Status: DC | PRN

## 2015-05-13 MED ORDER — GABAPENTIN 300 MG PO CAPS
300.0000 mg | ORAL_CAPSULE | Freq: Three times a day (TID) | ORAL | Status: DC
  Administered 2015-05-13 – 2015-05-15 (×5): 300 mg via ORAL
  Filled 2015-05-13 (×5): qty 1

## 2015-05-13 MED ORDER — HYDROMORPHONE HCL 1 MG/ML IJ SOLN
0.2500 mg | INTRAMUSCULAR | Status: DC | PRN
  Administered 2015-05-13 (×4): 0.5 mg via INTRAVENOUS

## 2015-05-13 MED ORDER — CEFAZOLIN SODIUM-DEXTROSE 2-4 GM/100ML-% IV SOLN
2.0000 g | INTRAVENOUS | Status: DC
  Filled 2015-05-13: qty 100

## 2015-05-13 MED ORDER — POLYETHYLENE GLYCOL 3350 17 G PO PACK: 17.0000 g | PACK | Freq: Every day | ORAL | Status: DC | PRN

## 2015-05-13 MED ORDER — VALSARTAN-HYDROCHLOROTHIAZIDE 80-12.5 MG PO TABS: 1.0000 | ORAL_TABLET | Freq: Every day | ORAL | Status: DC

## 2015-05-13 MED ORDER — DIPHENHYDRAMINE HCL 12.5 MG/5ML PO ELIX: 12.5000 mg | ORAL_SOLUTION | ORAL | Status: DC | PRN

## 2015-05-13 MED ORDER — PHENOL 1.4 % MT LIQD: 1.0000 | OROMUCOSAL | Status: DC | PRN

## 2015-05-13 MED ORDER — OXYCODONE HCL 5 MG PO TABS
ORAL_TABLET | ORAL | Status: AC
  Administered 2015-05-13: 10 mg via ORAL
  Filled 2015-05-13: qty 2

## 2015-05-13 MED ORDER — BUPIVACAINE HCL (PF) 0.25 % IJ SOLN
INTRAMUSCULAR | Status: AC
  Filled 2015-05-13: qty 30

## 2015-05-13 MED ORDER — HYDROMORPHONE HCL 1 MG/ML IJ SOLN
INTRAMUSCULAR | Status: AC
  Filled 2015-05-13: qty 1

## 2015-05-13 MED ORDER — ACETAMINOPHEN 325 MG PO TABS: 650.0000 mg | ORAL_TABLET | Freq: Four times a day (QID) | ORAL | Status: DC | PRN

## 2015-05-13 MED ORDER — BACLOFEN 10 MG PO TABS: 10.0000 mg | ORAL_TABLET | Freq: Three times a day (TID) | ORAL | Status: DC

## 2015-05-13 MED ORDER — MENTHOL 3 MG MT LOZG: 1.0000 | LOZENGE | OROMUCOSAL | Status: DC | PRN

## 2015-05-13 MED ORDER — LIDOCAINE HCL (CARDIAC) 20 MG/ML IV SOLN
INTRAVENOUS | Status: DC | PRN
  Administered 2015-05-13: 20 mg via INTRAVENOUS

## 2015-05-13 MED ORDER — METHOCARBAMOL 500 MG PO TABS
500.0000 mg | ORAL_TABLET | Freq: Four times a day (QID) | ORAL | Status: DC | PRN
  Administered 2015-05-13 – 2015-05-15 (×3): 500 mg via ORAL
  Filled 2015-05-13 (×2): qty 1

## 2015-05-13 MED ORDER — CHLORHEXIDINE GLUCONATE 4 % EX LIQD: 60.0000 mL | Freq: Once | CUTANEOUS | Status: DC

## 2015-05-13 MED ORDER — IRBESARTAN 150 MG PO TABS
75.0000 mg | ORAL_TABLET | Freq: Every day | ORAL | Status: DC
  Administered 2015-05-15: 75 mg via ORAL
  Filled 2015-05-13 (×2): qty 1

## 2015-05-13 MED ORDER — PROPOFOL 10 MG/ML IV BOLUS
INTRAVENOUS | Status: AC
  Filled 2015-05-13: qty 20

## 2015-05-13 MED ORDER — RIVAROXABAN 10 MG PO TABS: 10.0000 mg | ORAL_TABLET | Freq: Every day | ORAL | Status: DC

## 2015-05-13 MED ORDER — ALUM & MAG HYDROXIDE-SIMETH 200-200-20 MG/5ML PO SUSP: 30.0000 mL | ORAL | Status: DC | PRN

## 2015-05-13 MED ORDER — BACLOFEN 10 MG PO TABS
10.0000 mg | ORAL_TABLET | Freq: Three times a day (TID) | ORAL | Status: DC
  Administered 2015-05-13 – 2015-05-15 (×5): 10 mg via ORAL
  Filled 2015-05-13 (×5): qty 1

## 2015-05-13 MED ORDER — ESCITALOPRAM OXALATE 10 MG PO TABS
40.0000 mg | ORAL_TABLET | Freq: Every day | ORAL | Status: DC
  Administered 2015-05-14 – 2015-05-15 (×2): 40 mg via ORAL
  Filled 2015-05-13 (×2): qty 4

## 2015-05-13 MED ORDER — HYDROCHLOROTHIAZIDE 12.5 MG PO CAPS
12.5000 mg | ORAL_CAPSULE | Freq: Every day | ORAL | Status: DC
  Administered 2015-05-15: 12.5 mg via ORAL
  Filled 2015-05-13 (×2): qty 1

## 2015-05-13 MED ORDER — SODIUM CHLORIDE 0.9 % IR SOLN: Status: DC | PRN

## 2015-05-13 MED ORDER — METHOCARBAMOL 500 MG PO TABS
ORAL_TABLET | ORAL | Status: AC
  Administered 2015-05-13: 500 mg via ORAL
  Filled 2015-05-13: qty 1

## 2015-05-13 MED ORDER — METOCLOPRAMIDE HCL 5 MG/ML IJ SOLN: 5.0000 mg | Freq: Three times a day (TID) | INTRAMUSCULAR | Status: DC | PRN

## 2015-05-13 MED ORDER — OXYCODONE-ACETAMINOPHEN 10-325 MG PO TABS: 1.0000 | ORAL_TABLET | Freq: Four times a day (QID) | ORAL | Status: DC | PRN

## 2015-05-13 MED ORDER — METHOCARBAMOL 1000 MG/10ML IJ SOLN
500.0000 mg | Freq: Four times a day (QID) | INTRAVENOUS | Status: DC | PRN
  Filled 2015-05-13: qty 5

## 2015-05-13 MED ORDER — DOCUSATE SODIUM 100 MG PO CAPS: 100.0000 mg | ORAL_CAPSULE | Freq: Two times a day (BID) | ORAL | Status: DC

## 2015-05-13 MED ORDER — ATORVASTATIN CALCIUM 20 MG PO TABS
20.0000 mg | ORAL_TABLET | Freq: Every day | ORAL | Status: DC
  Administered 2015-05-14 – 2015-05-15 (×2): 20 mg via ORAL
  Filled 2015-05-13 (×2): qty 1

## 2015-05-13 MED ORDER — ONDANSETRON HCL 4 MG PO TABS: 4.0000 mg | ORAL_TABLET | Freq: Three times a day (TID) | ORAL | Status: DC | PRN

## 2015-05-13 MED ORDER — SENNOSIDES-DOCUSATE SODIUM 8.6-50 MG PO TABS
2.0000 | ORAL_TABLET | Freq: Every day | ORAL | Status: DC
  Administered 2015-05-14 – 2015-05-15 (×2): 2 via ORAL
  Filled 2015-05-13 (×3): qty 2

## 2015-05-13 MED ORDER — HYDROMORPHONE HCL 1 MG/ML IJ SOLN
1.0000 mg | INTRAMUSCULAR | Status: DC | PRN
  Administered 2015-05-13 – 2015-05-14 (×3): 1 mg via INTRAVENOUS
  Filled 2015-05-13 (×2): qty 1

## 2015-05-13 MED ORDER — VANCOMYCIN HCL IN DEXTROSE 1-5 GM/200ML-% IV SOLN
1000.0000 mg | Freq: Two times a day (BID) | INTRAVENOUS | Status: DC
  Administered 2015-05-14 – 2015-05-15 (×3): 1000 mg via INTRAVENOUS
  Filled 2015-05-13 (×4): qty 200

## 2015-05-13 MED ORDER — DEXTROSE 5 % IV SOLN
500.0000 mg | INTRAVENOUS | Status: AC
  Administered 2015-05-13: 500 mg via INTRAVENOUS
  Filled 2015-05-13: qty 5

## 2015-05-13 MED ORDER — FENTANYL CITRATE (PF) 250 MCG/5ML IJ SOLN
INTRAMUSCULAR | Status: AC
  Filled 2015-05-13: qty 5

## 2015-05-13 MED ORDER — ONDANSETRON HCL 4 MG PO TABS: 4.0000 mg | ORAL_TABLET | Freq: Four times a day (QID) | ORAL | Status: DC | PRN

## 2015-05-13 MED ORDER — INSULIN PUMP
SUBCUTANEOUS | Status: DC
  Administered 2015-05-13: 23:00:00 via SUBCUTANEOUS
  Filled 2015-05-13: qty 1

## 2015-05-13 MED ORDER — OXYCODONE HCL 5 MG PO TABS
5.0000 mg | ORAL_TABLET | ORAL | Status: DC | PRN
  Administered 2015-05-13 – 2015-05-15 (×11): 10 mg via ORAL
  Filled 2015-05-13 (×10): qty 2

## 2015-05-13 MED ORDER — VANCOMYCIN HCL IN DEXTROSE 1-5 GM/200ML-% IV SOLN
INTRAVENOUS | Status: AC
  Administered 2015-05-13: 1000 mg via INTRAVENOUS
  Filled 2015-05-13: qty 200

## 2015-05-13 MED ORDER — SODIUM CHLORIDE 0.9 % IR SOLN
Status: DC | PRN
  Administered 2015-05-13 (×3): 3000 mL

## 2015-05-13 MED ORDER — ACETAMINOPHEN 650 MG RE SUPP: 650.0000 mg | Freq: Four times a day (QID) | RECTAL | Status: DC | PRN

## 2015-05-13 MED ORDER — LACTATED RINGERS IV SOLN
INTRAVENOUS | Status: DC
  Administered 2015-05-13 (×3): via INTRAVENOUS

## 2015-05-13 MED ORDER — RIVAROXABAN 10 MG PO TABS
10.0000 mg | ORAL_TABLET | Freq: Every day | ORAL | Status: DC
  Administered 2015-05-14 – 2015-05-15 (×2): 10 mg via ORAL
  Filled 2015-05-13 (×2): qty 1

## 2015-05-13 MED ORDER — POTASSIUM CHLORIDE IN NACL 20-0.45 MEQ/L-% IV SOLN
INTRAVENOUS | Status: DC
  Administered 2015-05-14: 05:00:00 via INTRAVENOUS
  Filled 2015-05-13 (×4): qty 1000

## 2015-05-13 MED ORDER — ONDANSETRON HCL 4 MG/2ML IJ SOLN: 4.0000 mg | Freq: Four times a day (QID) | INTRAMUSCULAR | Status: DC | PRN

## 2015-05-13 MED ORDER — POVIDONE-IODINE 10 % EX SWAB: 2.0000 "application " | Freq: Once | CUTANEOUS | Status: DC

## 2015-05-13 MED ORDER — MIDAZOLAM HCL 2 MG/2ML IJ SOLN: 0.5000 mg | Freq: Once | INTRAMUSCULAR | Status: DC | PRN

## 2015-05-13 MED ORDER — METOCLOPRAMIDE HCL 5 MG PO TABS: 5.0000 mg | ORAL_TABLET | Freq: Three times a day (TID) | ORAL | Status: DC | PRN

## 2015-05-13 MED ORDER — SENNA 8.6 MG PO TABS: 1.0000 | ORAL_TABLET | Freq: Two times a day (BID) | ORAL | Status: DC

## 2015-05-13 MED ORDER — 0.9 % SODIUM CHLORIDE (POUR BTL) OPTIME
TOPICAL | Status: DC | PRN
  Administered 2015-05-13: 1000 mL

## 2015-05-13 MED ORDER — BISACODYL 10 MG RE SUPP: 10.0000 mg | Freq: Every day | RECTAL | Status: DC | PRN

## 2015-05-13 MED ORDER — EPHEDRINE SULFATE 50 MG/ML IJ SOLN
INTRAMUSCULAR | Status: DC | PRN
  Administered 2015-05-13: 15 mg via INTRAVENOUS
  Administered 2015-05-13: 10 mg via INTRAVENOUS

## 2015-05-13 MED ORDER — FENTANYL CITRATE (PF) 100 MCG/2ML IJ SOLN
INTRAMUSCULAR | Status: DC | PRN
  Administered 2015-05-13 (×2): 50 ug via INTRAVENOUS
  Administered 2015-05-13: 25 ug via INTRAVENOUS
  Administered 2015-05-13: 150 ug via INTRAVENOUS
  Administered 2015-05-13: 25 ug via INTRAVENOUS
  Administered 2015-05-13: 50 ug via INTRAVENOUS

## 2015-05-13 MED ORDER — MAGNESIUM CITRATE PO SOLN: 1.0000 | Freq: Once | ORAL | Status: DC | PRN

## 2015-05-13 MED ORDER — ONDANSETRON HCL 4 MG/2ML IJ SOLN
INTRAMUSCULAR | Status: DC | PRN
  Administered 2015-05-13: 4 mg via INTRAVENOUS

## 2015-05-13 MED ORDER — MIDAZOLAM HCL 2 MG/2ML IJ SOLN
INTRAMUSCULAR | Status: AC
  Filled 2015-05-13: qty 2

## 2015-05-13 MED ORDER — PROPOFOL 10 MG/ML IV BOLUS
INTRAVENOUS | Status: DC | PRN
  Administered 2015-05-13: 200 mg via INTRAVENOUS

## 2015-05-13 MED ORDER — MIDAZOLAM HCL 5 MG/5ML IJ SOLN
INTRAMUSCULAR | Status: DC | PRN
  Administered 2015-05-13: 2 mg via INTRAVENOUS

## 2015-05-13 MED ORDER — BUPROPION HCL ER (SR) 100 MG PO TB12
200.0000 mg | ORAL_TABLET | Freq: Every day | ORAL | Status: DC
  Administered 2015-05-14 – 2015-05-15 (×2): 200 mg via ORAL
  Filled 2015-05-13 (×5): qty 2

## 2015-05-13 MED ORDER — MEPERIDINE HCL 25 MG/ML IJ SOLN: 6.2500 mg | INTRAMUSCULAR | Status: DC | PRN

## 2015-05-13 SURGICAL SUPPLY — 77 items
APL SKNCLS STERI-STRIP NONHPOA (GAUZE/BANDAGES/DRESSINGS) ×1
BANDAGE ELASTIC 6 VELCRO ST LF (GAUZE/BANDAGES/DRESSINGS) ×3 IMPLANT
BANDAGE ESMARK 6X9 LF (GAUZE/BANDAGES/DRESSINGS) ×1 IMPLANT
BEARING MENISCAL TIBIAL 5 SM L (Orthopedic Implant) ×1 IMPLANT
BENZOIN TINCTURE PRP APPL 2/3 (GAUZE/BANDAGES/DRESSINGS) ×2 IMPLANT
BLADE SAG 18X100X1.27 (BLADE) ×1 IMPLANT
BNDG CMPR 9X6 STRL LF SNTH (GAUZE/BANDAGES/DRESSINGS) ×1
BNDG ESMARK 6X9 LF (GAUZE/BANDAGES/DRESSINGS) ×2
BOOTCOVER CLEANROOM LRG (PROTECTIVE WEAR) ×3 IMPLANT
BOWL SMART MIX CTS (DISPOSABLE) IMPLANT
BRNG TIB SM 5 PHS 3 LT MEN (Orthopedic Implant) ×1 IMPLANT
CLSR STERI-STRIP ANTIMIC 1/2X4 (GAUZE/BANDAGES/DRESSINGS) ×2 IMPLANT
COVER SURGICAL LIGHT HANDLE (MISCELLANEOUS) ×2 IMPLANT
CUFF TOURNIQUET SINGLE 34IN LL (TOURNIQUET CUFF) IMPLANT
DRAPE EXTREMITY T 121X128X90 (DRAPE) ×2 IMPLANT
DRAPE IMP U-DRAPE 54X76 (DRAPES) ×1 IMPLANT
DRAPE PROXIMA HALF (DRAPES) ×1 IMPLANT
DRAPE U-SHAPE 47X51 STRL (DRAPES) ×2 IMPLANT
DRSG ADAPTIC 3X8 NADH LF (GAUZE/BANDAGES/DRESSINGS) ×1 IMPLANT
DRSG PAD ABDOMINAL 8X10 ST (GAUZE/BANDAGES/DRESSINGS) ×2 IMPLANT
DURAPREP 26ML APPLICATOR (WOUND CARE) ×1 IMPLANT
ELECT CAUTERY BLADE 6.4 (BLADE) ×2 IMPLANT
ELECT REM PT RETURN 9FT ADLT (ELECTROSURGICAL) ×2
ELECTRODE REM PT RTRN 9FT ADLT (ELECTROSURGICAL) ×1 IMPLANT
EVACUATOR 1/8 PVC DRAIN (DRAIN) ×1 IMPLANT
FACESHIELD WRAPAROUND (MASK) IMPLANT
FACESHIELD WRAPAROUND OR TEAM (MASK) ×1 IMPLANT
GAUZE SPONGE 4X4 12PLY STRL (GAUZE/BANDAGES/DRESSINGS) ×2 IMPLANT
GLOVE BIO SURGEON STRL SZ7.5 (GLOVE) ×1 IMPLANT
GLOVE BIOGEL PI IND STRL 7.0 (GLOVE) IMPLANT
GLOVE BIOGEL PI INDICATOR 7.0 (GLOVE) ×1
GLOVE BIOGEL PI ORTHO PRO SZ8 (GLOVE) ×2
GLOVE ORTHO TXT STRL SZ7.5 (GLOVE) ×5 IMPLANT
GLOVE PI ORTHO PRO STRL SZ8 (GLOVE) ×2 IMPLANT
GLOVE SURG ORTHO 8.0 STRL STRW (GLOVE) ×3 IMPLANT
GLOVE SURG SS PI 8.0 STRL IVOR (GLOVE) ×1 IMPLANT
GOWN STRL REUS W/ TWL LRG LVL3 (GOWN DISPOSABLE) IMPLANT
GOWN STRL REUS W/ TWL XL LVL3 (GOWN DISPOSABLE) IMPLANT
GOWN STRL REUS W/TWL LRG LVL3 (GOWN DISPOSABLE) ×6
GOWN STRL REUS W/TWL XL LVL3 (GOWN DISPOSABLE) ×2
HANDPIECE INTERPULSE COAX TIP (DISPOSABLE)
HOOD PEEL AWAY FACE SHEILD DIS (HOOD) ×5 IMPLANT
KIT BASIN OR (CUSTOM PROCEDURE TRAY) ×2 IMPLANT
KIT ROOM TURNOVER OR (KITS) ×2 IMPLANT
MANIFOLD NEPTUNE II (INSTRUMENTS) ×2 IMPLANT
NDL 18GX1X1/2 (RX/OR ONLY) (NEEDLE) IMPLANT
NEEDLE 18GX1X1/2 (RX/OR ONLY) (NEEDLE) ×2 IMPLANT
NS IRRIG 1000ML POUR BTL (IV SOLUTION) ×2 IMPLANT
PACK TOTAL JOINT (CUSTOM PROCEDURE TRAY) ×2 IMPLANT
PACK UNIVERSAL I (CUSTOM PROCEDURE TRAY) ×1 IMPLANT
PAD ARMBOARD 7.5X6 YLW CONV (MISCELLANEOUS) ×4 IMPLANT
PAD CAST 4YDX4 CTTN HI CHSV (CAST SUPPLIES) ×1 IMPLANT
PADDING CAST COTTON 4X4 STRL (CAST SUPPLIES) ×2
PADDING CAST COTTON 6X4 STRL (CAST SUPPLIES) ×2 IMPLANT
SET CYSTO W/LG BORE CLAMP LF (SET/KITS/TRAYS/PACK) ×1 IMPLANT
SET HNDPC FAN SPRY TIP SCT (DISPOSABLE) ×1 IMPLANT
SPONGE GAUZE 4X4 12PLY STER LF (GAUZE/BANDAGES/DRESSINGS) ×1 IMPLANT
STAPLER VISISTAT 35W (STAPLE) IMPLANT
SUCTION FRAZIER HANDLE 10FR (MISCELLANEOUS) ×1
SUCTION TUBE FRAZIER 10FR DISP (MISCELLANEOUS) ×1 IMPLANT
SUT ETHILON 3 0 FSL (SUTURE) ×1 IMPLANT
SUT MNCRL AB 4-0 PS2 18 (SUTURE) IMPLANT
SUT PROLENE 0 CT 1 30 (SUTURE) ×1 IMPLANT
SUT PROLENE 2 0 CT2 30 (SUTURE) ×1 IMPLANT
SUT VIC AB 0 CT1 27 (SUTURE)
SUT VIC AB 0 CT1 27XBRD ANBCTR (SUTURE) ×1 IMPLANT
SUT VIC AB 2-0 CT1 27 (SUTURE)
SUT VIC AB 2-0 CT1 TAPERPNT 27 (SUTURE) ×1 IMPLANT
SUT VIC AB 3-0 SH 18 (SUTURE) ×1 IMPLANT
SWAB COLLECTION DEVICE MRSA (MISCELLANEOUS) ×2 IMPLANT
SWAB CULTURE ESWAB REG 1ML (MISCELLANEOUS) ×3 IMPLANT
SYR 30ML LL (SYRINGE) ×2 IMPLANT
TOWEL OR 17X24 6PK STRL BLUE (TOWEL DISPOSABLE) ×1 IMPLANT
TOWEL OR 17X26 10 PK STRL BLUE (TOWEL DISPOSABLE) ×2 IMPLANT
TRAY FOLEY CATH 16FRSI W/METER (SET/KITS/TRAYS/PACK) IMPLANT
WATER STERILE IRR 1000ML POUR (IV SOLUTION) ×3 IMPLANT
YANKAUER SUCT BULB TIP NO VENT (SUCTIONS) ×1 IMPLANT

## 2015-05-13 NOTE — Op Note (Signed)
05/13/2015  2:48 PM  PATIENT:  [name]    PRE-OPERATIVE DIAGNOSIS:  Acute Postoperativeleft knee superficial infection, Possible deep periprosthetic joint infection  POST-OPERATIVE DIAGNOSIS:  Acute postoperative left knee superficial dehiscence with infection, no clear evidence for deep periprosthetic joint infection.  PROCEDURE:  Left knee aspiration, with Left knee exploration, irrigation and debridement of skin, subcutaneous tissue, and capsule, followed by arthrotomy with synovectomy and irrigation and debridement of the joint With polyethylene exchange  SURGEON:  Johnny Bridge, MD  PHYSICIAN ASSISTANT: Joya Gaskins, OPA-C, present and scrubbed throughout the case, critical for completion in a timely fashion, and for retraction, instrumentation, and closure.  Second assistant: Basilio Cairo, PA-C student  Specimens: We sent superficial cultures for Gram stain culture and sensitivity including swabs and tissue. We also sent deep aspiration of the intra-articular joint fluid for cell count, and analysis, culture and sensitivity. We also sent deep tissue cultures with swab and tissue from the intra-articular portion of the joint itself.  ANESTHESIA:   General  PREOPERATIVE INDICATIONS:  [name] is a  60 y.o. female Who has poorly controlled diabetes with a history of osteomyelitis of the lower extremity who underwent left partial knee replacement approximately 2 weeks ago. She had a fall, and a acute dehiscence of her wound last week. The wound appeared clean, she did not have any surrounding cellulitis, did not have any significant knee pain, and was regaining motion well. We placed her on empiric antibiotics, and wound care, and she came back to see me a week later and the wound appeared to be worsening, and was further dehisced, and open. She denied any fevers or chills. Given her high risk of infection, and the concern for the potential of acute prosthetic infection, she  elected for surgical management urgently in order to reduce the risk for prosthetic joint infection and the development of osteomyelitis.  The risks benefits and alternatives were discussed with the patient preoperatively including but not limited to the risks of infection, bleeding, nerve injury, cardiopulmonary complications, the need for revision surgery, among others, and the patient was willing to proceed. We also discussed the potential need for future revision surgery, cement interposition arthroplasty, even the potential for amputation, and she is willing to proceed.  OPERATIVE IMPLANTS: We removed a Biomet size 5 polyethylene implant with replacement of the same fresh implant, size 5 left medial polyethylene.  OPERATIVE FINDINGS: The wound was dehisced at the inferior half of the incision, and had some necrotic somewhat purulent material around the subcutaneous tissue. This is a sickly exposed the underlying capsule. The capsule itself was intact, and I did not appreciate disruption of the capsular repair.  Aspiration of the knee was performed using sterile technique, and this yielded a fairly substantial amount of serosanguineous joint fluid, although it did not look purulent. This was sent for cell count and analysis and Gram stain and culture and sensitivity.  I flexed the knee with the subcutaneous tissue debrided and exposed, to evaluate if any fluid came out of the knee joint itself, and the capsular repair did appear watertight, and I did not appreciate that the infection necessarily tracked deep to the fascia.  Nonetheless, given her high risk for infection, and the fact that we have a window right now that if in fact she did have a deep infection that was not observable on the macroscopic level, I did go ahead and proceed with the full definitive operation with synovectomy, irrigation and debridement of the joint, with  polyethylene exchange.  OPERATIVE PROCEDURE: The patient was  brought to the operating room and placed in supine position. Gen. Anesthesia was administered. IV antibiotics were held until the deep cultures were taken. We then gave IV vancomycin. The left lower extremity was prepped and draped in usual sterile fashion using scrub and paint Betadine.  Timeout performed, and then I completed the wound dehiscence by extending the incision proximally. He is to a Ray-Tec gauze to debride the subcutaneous tissue, along with a scalpel, a rongeur, and curettes. After complete debridement of the gross debris in the distal portion of the wound was completed I inspected the fascia, and I did not find that it tracked deep.  I irrigated a total of 3 L through the subcutaneous tissue at this level.  The entire surgical team then changed gloves, discarded the instruments, and proceeded with the deep intra-articular portion of the case. Medial parapatellar arthrotomy was carried out through the previous incision, all of the loose strands of Vicryl were removed. The synovium was exposed, and Partial synovectomy was performed, primarily in the superior and medial compartments. There was a little bit of fibrinous material in the notch, which was removed. I did not encounter any discrete purulence in the deep compartments of the knee. I irrigated a liter of fluid through the knee, and then removed the polyethylene. I curetted the posterior aspect of the knee as well as the intra-articular medial aspect, and used a curet to debride the synovium diffusely throughout the knee as well. The cartilage was still intact under the patella and on the lateral compartment.  I then completed irrigation with the remaining 3 L bag, and after complete irrigation performed I used the trial 5 polyethylene paddle, and then placed the 5 poly-without difficulty. It tracked well and felt stable.  I then placed deep suprapatellar drains, a total of 2, and delivered the tail of the drain down into the medial  and lateral gutters.  The capsule was repaired with 0 Prolene followed by nylon for the skin.  Sterile gauze was applied, she was awakened and returned to the PACU in stable and satisfactory condition. She will receive a PICC line and IV antibiotics, we are taking all precautions to try and avoid a chronic prosthetic joint infection.

## 2015-05-13 NOTE — H&P (Signed)
PREOPERATIVE H&P  Chief Complaint: Left knee infection  HPI: [name] is a 60 y.o. female who had a left partial knee replacement performed about 2 or 3 weeks ago. She has morbid obesity, as well as diabetes, and has a history of infection in her foot requiring amputation. She fell about 1 week postoperatively, and had a partial disruption of her wound. She was treated with wound care and antibiotics, but over the past week has had progressive increasing dehiscence of her wound. She was seen in the office this morning, and there was a purulent smell, and although no significant cellulitis or effusion and minimal pain, I recommended urgent surgical irrigation and debridement with exploration and wound closure in order to try and save her knee.  Past Medical History  Diagnosis Date  . Type 2 diabetes mellitus (HCC)     insulin pump  . Hypertension   . OSA on CPAP     AHI = 43 (per patient)  . History of nuclear stress test 12/16/2011    exercise myoview; normal images with 2-8mm ST-segment depression - subsequent cath revelaed subtotally occluded small 2nd marginal branch & 75% PDA lesion, normal LV function  . Hypothyroidism   . Coronary artery disease   . Tobacco abuse   . Depression   . Anxiety   . Neuropathy ([place])     in feet  . Arthritis     knees  . Fatty liver   . Personal history of kidney stones   . Primary localized osteoarthritis of left knee 04/24/2015   Past Surgical History  Procedure Laterality Date  . Cardiac catheterization  01/04/2012    subtotally occluded small 2nd marginal branch & 75% PDA lesion, normal LV function  . Cholecystectomy  2008  . Cataract extraction Bilateral     with lens implants  . Breast enhancement surgery Bilateral   . Left heart catheterization with coronary angiogram N/A 01/04/2012    Procedure: LEFT HEART CATHETERIZATION WITH CORONARY ANGIOGRAM;  Surgeon: Lorretta Harp, MD;  Location: [facility];  Service: Cardiovascular;   Laterality: N/A;  . Colonoscopy    . Posterior cervical fusion/foraminotomy    . Amputation Right 12/22/2014    Procedure: RIGHT 2ND TOE AMPUTATION;  Surgeon: [name], MD;  Location: [place];  Service: Orthopedics;  Laterality: Right;  . Robotic assisted total hysterectomy with bilateral salpingo oopherectomy Bilateral 02/17/2015    Procedure: ROBOTIC ASSISTED TOTAL HYSTERECTOMY WITH BILATERAL SALPINGO OOPHORECTOMY;  Surgeon: Everitt Amber, MD;  Location: [facility];  Service: Gynecology;  Laterality: Bilateral;  . Partial knee arthroplasty Left 04/24/2015    Procedure: LEFT UNICOMPARTMENTAL KNEE ARTHROPLASTY ;  Surgeon: Marchia Bond, MD;  Location: [place];  Service: Orthopedics;  Laterality: Left;   Social History   Social History  . Marital Status: Married    Spouse Name: N/A  . Number of Children: N/A  . Years of Education: 14   Occupational History  . respiratory therapist Other    [employer]   Social History Main Topics  . Smoking status: Current Some Day Smoker -- 0.25 packs/day for 25 years    Types: Cigarettes    Last Attempt to Quit: 12/15/2014  . Smokeless tobacco: Never Used  . Alcohol Use: No  . Drug Use: No  . Sexual Activity: Not Asked   Other Topics Concern  . None   Social History Narrative   Family History  Problem Relation Age of Onset  . Stroke Mother   .  Hypertension Mother   . Diabetes Mother   . Alzheimer's disease Mother   . Diabetes Father   . COPD Father     vent-dependent, MODS  . Hypertension Sister   . Mental illness Sister     borderline personality d/o  . Mental illness Sister     schizoeffective d/o  . Diabetes Sister    No Known Allergies Prior to Admission medications   Medication Sig Start Date End Date Taking? Authorizing Provider  ACCU-CHEK AVIVA PLUS test strip  12/19/14  Yes Historical Provider, MD  ALPRAZolam Duanne Moron) 0.5 MG tablet Take 1 tablet by mouth 3 (three) times daily as needed. 05/07/15   Yes Historical Provider, MD  atorvastatin (LIPITOR) 20 MG tablet Take 20 mg by mouth daily. 10/12/14  Yes Historical Provider, MD  baclofen (LIORESAL) 10 MG tablet Take 1 tablet (10 mg total) by mouth 3 (three) times daily. As needed for muscle spasm 04/24/15  Yes Marchia Bond, MD  buPROPion [facility] SR) 200 MG 12 hr tablet Take 200 mg by mouth daily.  08/26/13  Yes Historical Provider, MD  diclofenac sodium (VOLTAREN) 1 % GEL Apply 1 application topically 4 (four) times daily as needed.    Yes Historical Provider, MD  escitalopram (LEXAPRO) 20 MG tablet Take 40 mg by mouth daily.   Yes Historical Provider, MD  gabapentin (NEURONTIN) 300 MG capsule Take 300 mg by mouth 3 (three) times daily. 10/12/14  Yes Historical Provider, MD  HUMALOG 100 UNIT/ML injection Pt uses in her Insulin Pump 12/19/14  Yes Historical Provider, MD  Insulin Human (INSULIN PUMP) SOLN Inject into the skin continuous. Humalog - basal rate 2.75/hr   Yes Historical Provider, MD  levothyroxine (SYNTHROID, LEVOTHROID) 112 MCG tablet Take 112 mcg by mouth daily before breakfast. 10/12/14  Yes Historical Provider, MD  ondansetron (ZOFRAN) 4 MG tablet Take 1 tablet (4 mg total) by mouth every 8 (eight) hours as needed for nausea or vomiting. 04/24/15  Yes Marchia Bond, MD  oxyCODONE-acetaminophen (PERCOCET) 10-325 MG tablet Take 1-2 tablets by mouth every 6 (six) hours as needed for pain. MAXIMUM TOTAL ACETAMINOPHEN DOSE IS 4000 MG PER DAY 04/24/15  Yes Marchia Bond, MD  rivaroxaban (XARELTO) 10 MG TABS tablet Take 1 tablet (10 mg total) by mouth daily. 04/24/15  Yes Marchia Bond, MD  sennosides-docusate sodium (SENOKOT-S) 8.6-50 MG tablet Take 2 tablets by mouth daily. 04/24/15  Yes Marchia Bond, MD  valsartan-hydrochlorothiazide (DIOVAN-HCT) 80-12.5 MG per tablet Take 1 tablet by mouth daily.   Yes Historical Provider, MD     Positive ROS: All other systems have been reviewed and were otherwise negative with the exception of those  mentioned in the HPI and as above.  Physical Exam: General: Alert, no acute distress Cardiovascular: No pedal edema Respiratory: No cyanosis, no use of accessory musculature GI: No organomegaly, abdomen is soft and non-tender Skin: No lesions in the area of chief complaint Neurologic: Sensation intact distally Psychiatric: Patient is competent for consent with normal mood and affect Lymphatic: No axillary or cervical lymphadenopathy  MUSCULOSKELETAL: Left knee has an open wound about 3 cm at the distal portion of the incision with some mild purulence, minimal surrounding cellulitis.  Assessment: Left knee acute postoperative prosthetic wound dehiscence extremely concerning for infection   Plan: Plan for Procedure(s): Exploration of the left knee with IRRIGATION AND DEBRIDEMENT KNEE WITH POSSIBLE POLY EXCHANGE  The risks benefits and alternatives were discussed with the patient including but not limited to the risks of nonoperative  treatment, versus surgical intervention including infection, bleeding, nerve injury,  blood clots, cardiopulmonary complications, morbidity, mortality, among others, and they were willing to proceed. We've also discussed the risks for the potential of increased infection, need for multiple stage excisional arthroplasty, persistent osteomyelitis, even the potential for amputation.  Johnny Bridge, MD Cell [phone]   05/13/2015 12:47 PM

## 2015-05-13 NOTE — Anesthesia Procedure Notes (Signed)
Procedure Name: LMA Insertion Date/Time: 05/13/2015 1:36 PM Performed by: Carney Living Pre-anesthesia Checklist: Patient identified, Emergency Drugs available, Suction available, Patient being monitored and Timeout performed Patient Re-evaluated:Patient Re-evaluated prior to inductionOxygen Delivery Method: Circle system utilized Preoxygenation: Pre-oxygenation with 100% oxygen Intubation Type: IV induction LMA: LMA inserted LMA Size: 4.0 Number of attempts: 1 Placement Confirmation: positive ETCO2 and breath sounds checked- equal and bilateral Tube secured with: Tape Dental Injury: Teeth and Oropharynx as per pre-operative assessment

## 2015-05-13 NOTE — Transfer of Care (Signed)
Immediate Anesthesia Transfer of Care Note  Patient: [name]  Procedure(s) Performed: Procedure(s): IRRIGATION AND DEBRIDEMENT KNEE WITH POLY EXCHANGE (Left)  Patient Location: PACU  Anesthesia Type:General  Level of Consciousness: awake, alert , oriented and patient cooperative  Airway & Oxygen Therapy: Patient Spontanous Breathing and Patient connected to nasal cannula oxygen  Post-op Assessment: Report given to RN, Post -op Vital signs reviewed and stable and Patient moving all extremities X 4  Post vital signs: Reviewed and stable  Last Vitals:  Filed Vitals:   05/13/15 1229 05/13/15 1527  BP: 138/59 155/72  Pulse: 72 100  Temp: 36.4 C 36.6 C  Resp: 16 15    Complications: No apparent anesthesia complications

## 2015-05-13 NOTE — Anesthesia Preprocedure Evaluation (Addendum)
Anesthesia Evaluation  Patient identified by MRN, date of birth, ID band Patient awake    Reviewed: Allergy & Precautions, NPO status , Patient's Chart, lab work & pertinent test results  History of Anesthesia Complications Negative for: history of anesthetic complications  Airway Mallampati: II  TM Distance: >3 FB Neck ROM: Full    Dental  (+) Teeth Intact, Dental Advisory Given   Pulmonary sleep apnea and Continuous Positive Airway Pressure Ventilation , Current Smoker,    breath sounds clear to auscultation       Cardiovascular hypertension, Pt. on medications + CAD   Rhythm:Regular Rate:Normal  '13 exercise myoview: normal images with 2-70mm ST-segment depression  '13 cath: subtotally occluded small 2nd marginal branch & 75% PDA lesion, normal LV function   Neuro/Psych Anxiety Depression negative neurological ROS     GI/Hepatic Neg liver ROS, GERD  Medicated and Controlled,  Endo/Other  diabetes (glu 139), Insulin DependentHypothyroidism Morbid obesityInsulin pump  Renal/GU negative Renal ROS     Musculoskeletal  (+) Arthritis , Osteoarthritis,    Abdominal (+) + obese,   Peds  Hematology  (+) Blood dyscrasia (xarelto), ,   Anesthesia Other Findings   Reproductive/Obstetrics                          Anesthesia Physical Anesthesia Plan  ASA: III  Anesthesia Plan: General   Post-op Pain Management:    Induction: Intravenous  Airway Management Planned: LMA  Additional Equipment:   Intra-op Plan:   Post-operative Plan: Extubation in OR  Informed Consent: I have reviewed the patients History and Physical, chart, labs and discussed the procedure including the risks, benefits and alternatives for the proposed anesthesia with the patient or authorized representative who has indicated his/her understanding and acceptance.   Dental advisory given  Plan Discussed with: CRNA,  Anesthesiologist and Surgeon  Anesthesia Plan Comments: (Plan routine monitors, GA- LMA OK Suspend pt's insulin pump intra-op, hourly glucose checks, use insulin infusion if need)      Anesthesia Quick Evaluation

## 2015-05-13 NOTE — Anesthesia Postprocedure Evaluation (Signed)
Anesthesia Post Note  Patient: [name]  Procedure(s) Performed: Procedure(s) (LRB): IRRIGATION AND DEBRIDEMENT KNEE WITH POLY EXCHANGE (Left)  Patient location during evaluation: PACU Anesthesia Type: General Level of consciousness: awake and alert, oriented and patient cooperative Pain management: pain level controlled Vital Signs Assessment: post-procedure vital signs reviewed and stable Respiratory status: spontaneous breathing, nonlabored ventilation, respiratory function stable and patient connected to nasal cannula oxygen Cardiovascular status: blood pressure returned to baseline and stable Postop Assessment: no signs of nausea or vomiting Anesthetic complications: no    Last Vitals:  Filed Vitals:   05/13/15 1614 05/13/15 1615  BP: 128/62   Pulse: 92 91  Temp:    Resp: 12 13    Last Pain:  Filed Vitals:   05/13/15 1617  PainSc: 7                  Evaan Tidwell,E. Lodie Waheed

## 2015-05-13 NOTE — Discharge Instructions (Signed)
INSTRUCTIONS AFTER JOINT REPLACEMENT   o Remove items at home which could result in a fall. This includes throw rugs or furniture in walking pathways o ICE to the affected joint every three hours while awake for 30 minutes at a time, for at least the first 3-5 days, and then as needed for pain and swelling.  Continue to use ice for pain and swelling. You may notice swelling that will progress down to the foot and ankle.  This is normal after surgery.  Elevate your leg when you are not up walking on it.   o Continue to use the breathing machine you got in the hospital (incentive spirometer) which will help keep your temperature down.  It is common for your temperature to cycle up and down following surgery, especially at night when you are not up moving around and exerting yourself.  The breathing machine keeps your lungs expanded and your temperature down.   DIET:  As you were doing prior to hospitalization, we recommend a well-balanced diet.  DRESSING / WOUND CARE / SHOWERING  Change dressing with clean dry gauze daily.   ACTIVITY  o Increase activity slowly as tolerated, but follow the weight bearing instructions below.   o No driving for 6 weeks or until further direction given by your physician.  You cannot drive while taking narcotics.  o No lifting or carrying greater than 10 lbs. until further directed by your surgeon. o Avoid periods of inactivity such as sitting longer than an hour when not asleep. This helps prevent blood clots.  o You may return to work once you are authorized by your doctor.     WEIGHT BEARING   Weight bearing as tolerated with assist device (walker, cane, etc) as directed, use it as long as suggested by your surgeon or therapist, typically at least 4-6 weeks.   EXERCISES  Results after joint replacement surgery are often greatly improved when you follow the exercise, range of motion and muscle strengthening exercises prescribed by your doctor. Safety  measures are also important to protect the joint from further injury. Any time any of these exercises cause you to have increased pain or swelling, decrease what you are doing until you are comfortable again and then slowly increase them. If you have problems or questions, call your caregiver or physical therapist for advice.   Rehabilitation is important following a joint replacement. After just a few days of immobilization, the muscles of the leg can become weakened and shrink (atrophy).  These exercises are designed to build up the tone and strength of the thigh and leg muscles and to improve motion. Often times heat used for twenty to thirty minutes before working out will loosen up your tissues and help with improving the range of motion but do not use heat for the first two weeks following surgery (sometimes heat can increase post-operative swelling).   These exercises can be done on a training (exercise) mat, on the floor, on a table or on a bed. Use whatever works the best and is most comfortable for you.    Use music or television while you are exercising so that the exercises are a pleasant break in your day. This will make your life better with the exercises acting as a break in your routine that you can look forward to.   Perform all exercises about fifteen times, three times per day or as directed.  You should exercise both the operative leg and the other leg as well.  Exercises include:    Quad Sets - Tighten up the muscle on the front of the thigh (Quad) and hold for 5-10 seconds.    Straight Leg Raises - With your knee straight (if you were given a brace, keep it on), lift the leg to 60 degrees, hold for 3 seconds, and slowly lower the leg.  Perform this exercise against resistance later as your leg gets stronger.   Leg Slides: Lying on your back, slowly slide your foot toward your buttocks, bending your knee up off the floor (only go as far as is comfortable). Then slowly slide your  foot back down until your leg is flat on the floor again.   Angel Wings: Lying on your back spread your legs to the side as far apart as you can without causing discomfort.   Hamstring Strength:  Lying on your back, push your heel against the floor with your leg straight by tightening up the muscles of your buttocks.  Repeat, but this time bend your knee to a comfortable angle, and push your heel against the floor.  You may put a pillow under the heel to make it more comfortable if necessary.   A rehabilitation program following joint replacement surgery can speed recovery and prevent re-injury in the future due to weakened muscles. Contact your doctor or a physical therapist for more information on knee rehabilitation.    CONSTIPATION  Constipation is defined medically as fewer than three stools per week and severe constipation as less than one stool per week.  Even if you have a regular bowel pattern at home, your normal regimen is likely to be disrupted due to multiple reasons following surgery.  Combination of anesthesia, postoperative narcotics, change in appetite and fluid intake all can affect your bowels.   YOU MUST use at least one of the following options; they are listed in order of increasing strength to get the job done.  They are all available over the counter, and you may need to use some, POSSIBLY even all of these options:    Drink plenty of fluids (prune juice may be helpful) and high fiber foods Colace 100 mg by mouth twice a day  Senokot for constipation as directed and as needed Dulcolax (bisacodyl), take with full glass of water  Miralax (polyethylene glycol) once or twice a day as needed.  If you have tried all these things and are unable to have a bowel movement in the first 3-4 days after surgery call either your surgeon or your primary doctor.    If you experience loose stools or diarrhea, hold the medications until you stool forms back up.  If your symptoms do not get  better within 1 week or if they get worse, check with your doctor.  If you experience "the worst abdominal pain ever" or develop nausea or vomiting, please contact the office immediately for further recommendations for treatment.   ITCHING:  If you experience itching with your medications, try taking only a single pain pill, or even half a pain pill at a time.  You can also use Benadryl over the counter for itching or also to help with sleep.   TED HOSE STOCKINGS:  Use stockings on both legs until for at least 2 weeks or as directed by physician office. They may be removed at night for sleeping.  MEDICATIONS:  See your medication summary on the After Visit Summary that nursing will review with you.  You may have some home medications which will  be placed on hold until you complete the course of blood thinner medication.  It is important for you to complete the blood thinner medication as prescribed.  PRECAUTIONS:  If you experience chest pain or shortness of breath - call 911 immediately for transfer to the hospital emergency department.   If you develop a fever greater that 101 F, purulent drainage from wound, increased redness or drainage from wound, foul odor from the wound/dressing, or calf pain - CONTACT YOUR SURGEON.                                                   FOLLOW-UP APPOINTMENTS:  If you do not already have a post-op appointment, please call the office for an appointment to be seen by your surgeon.  Guidelines for how soon to be seen are listed in your After Visit Summary, but are typically between 1-4 weeks after surgery.  MAKE SURE YOU:   Understand these instructions.   Get help right away if you are not doing well or get worse.    Thank you for letting us be a part of your medical care team.  It is a privilege we respect greatly.  We hope these instructions will help you stay on track for a fast and full recovery!

## 2015-05-13 NOTE — Progress Notes (Signed)
ANTIBIOTIC CONSULT NOTE - INITIAL  Pharmacy Consult for vancomycin Indication: left knee wound infection s/p I&D  No Known Allergies  Patient Measurements: Height: 5\' 7"  (170.2 cm) Weight: 245 lb (111.131 kg) IBW/kg (Calculated) : 61.6 Adjusted Body Weight:   Vital Signs: Temp: 98 F (36.7 C) (03/22 1800) Temp Source: Oral (03/22 1229) BP: 113/52 mmHg (03/22 1815) Pulse Rate: 89 (03/22 1815) Intake/Output from previous day:   Intake/Output from this shift: Total I/O In: 1475 [P.O.:125; I.V.:1350] Out: 600 [Urine:400; Blood:200]  Labs:  Recent Labs  05/13/15 1228  WBC 10.8*  HGB 13.2  PLT 291  CREATININE 0.68   Estimated Creatinine Clearance: 94.9 mL/min (by C-G formula based on Cr of 0.68). No results for input(s): VANCOTROUGH, VANCOPEAK, VANCORANDOM, GENTTROUGH, GENTPEAK, GENTRANDOM, TOBRATROUGH, TOBRAPEAK, TOBRARND, AMIKACINPEAK, AMIKACINTROU, AMIKACIN in the last 72 hours.   Microbiology: Recent Results (from the past 720 hour(s))  Surgical pcr screen     Status: None   Collection Time: 04/20/15  8:20 AM  Result Value Ref Range Status   MRSA, PCR NEGATIVE NEGATIVE Final   Staphylococcus aureus NEGATIVE NEGATIVE Final    Comment:        The Xpert SA Assay (FDA approved for NASAL specimens in patients over 57 years of age), is one component of a comprehensive surveillance program.  Test performance has been validated by [facility] for patients greater than or equal to 27 year old. It is not intended to diagnose infection nor to guide or monitor treatment.     Medical History: Past Medical History  Diagnosis Date  . Type 2 diabetes mellitus (HCC)     insulin pump  . Hypertension   . OSA on CPAP     AHI = 43 (per patient)  . History of nuclear stress test 12/16/2011    exercise myoview; normal images with 2-45mm ST-segment depression - subsequent cath revelaed subtotally occluded small 2nd marginal branch & 75% PDA lesion, normal LV function  .  Hypothyroidism   . Coronary artery disease   . Tobacco abuse   . Depression   . Anxiety   . Neuropathy ([place])     in feet  . Arthritis     knees  . Fatty liver   . Personal history of kidney stones   . Primary localized osteoarthritis of left knee 04/24/2015    Medications:  Scheduled:  . atorvastatin  20 mg Oral Daily  . baclofen  10 mg Oral TID  . buPROPion  200 mg Oral Daily  . docusate sodium  100 mg Oral BID  . escitalopram  40 mg Oral Daily  . gabapentin  300 mg Oral TID  . HYDROmorphone      . [START ON 05/14/2015] levothyroxine  112 mcg Oral QAC breakfast  . [START ON 05/14/2015] rivaroxaban  10 mg Oral Q breakfast  . senna  1 tablet Oral BID  . sennosides-docusate sodium  2 tablet Oral Daily  . valsartan-hydrochlorothiazide  1 tablet Oral Daily   Infusions:  . 0.45 % NaCl with KCl 20 mEq / L    . insulin pump     Assessment: 60 yo female with left knee wound infection s/p I&D will be continued on vancomycin.  She received vancomycin 1g x1 at 1404.  CrCl ~94.9  Goal of Therapy:  Vancomycin trough level 15-20 mcg/ml  Plan:  - vancomycin 1g iv q12h, 1st dose at 0200 tomorrow - monitor renal function and check vancomycin trough when it's appropriate.  Pinky Ravan, Tsz-Yin  05/13/2015,6:48 PM

## 2015-05-14 ENCOUNTER — Encounter (HOSPITAL_COMMUNITY): Payer: Self-pay | Admitting: Orthopedic Surgery

## 2015-05-14 DIAGNOSIS — B9689 Other specified bacterial agents as the cause of diseases classified elsewhere: Secondary | ICD-10-CM

## 2015-05-14 DIAGNOSIS — M00862 Arthritis due to other bacteria, left knee: Secondary | ICD-10-CM | POA: Diagnosis not present

## 2015-05-14 DIAGNOSIS — Y838 Other surgical procedures as the cause of abnormal reaction of the patient, or of later complication, without mention of misadventure at the time of the procedure: Secondary | ICD-10-CM

## 2015-05-14 DIAGNOSIS — T8454XA Infection and inflammatory reaction due to internal left knee prosthesis, initial encounter: Secondary | ICD-10-CM

## 2015-05-14 LAB — BASIC METABOLIC PANEL
Anion gap: 10 (ref 5–15)
BUN: 14 mg/dL (ref 6–20)
CHLORIDE: 100 mmol/L — AB (ref 101–111)
CO2: 26 mmol/L (ref 22–32)
Calcium: 9.2 mg/dL (ref 8.9–10.3)
Creatinine, Ser: 0.77 mg/dL (ref 0.44–1.00)
GFR calc non Af Amer: >60 mL/min (ref >60)
Glucose, Bld: 124 mg/dL — ABNORMAL HIGH (ref 65–99)
POTASSIUM: 4.1 mmol/L (ref 3.5–5.1)
SODIUM: 136 mmol/L (ref 135–145)

## 2015-05-14 LAB — CBC
HEMATOCRIT: 35.9 % — AB (ref 36.0–46.0)
HEMOGLOBIN: 11.4 g/dL — AB (ref 12.0–15.0)
MCH: 29.8 pg (ref 26.0–34.0)
MCHC: 31.8 g/dL (ref 30.0–36.0)
MCV: 94 fL (ref 78.0–100.0)
Platelets: 241 10*3/uL (ref 150–400)
RBC: 3.82 MIL/uL — AB (ref 3.87–5.11)
RDW: 14.3 % (ref 11.5–15.5)
WBC: 10 10*3/uL (ref 4.0–10.5)

## 2015-05-14 LAB — GLUCOSE, CAPILLARY
GLUCOSE-CAPILLARY: 149 mg/dL — AB (ref 65–99)
GLUCOSE-CAPILLARY: 79 mg/dL (ref 65–99)
GLUCOSE-CAPILLARY: 96 mg/dL (ref 65–99)
GLUCOSE-CAPILLARY: 149 mg/dL — AB (ref 65–99)

## 2015-05-14 LAB — SEDIMENTATION RATE: SED RATE: 40 mm/h — AB (ref 0–22)

## 2015-05-14 LAB — C-REACTIVE PROTEIN: CRP: 2 mg/dL — AB (ref <1.0)

## 2015-05-14 LAB — HEMOGLOBIN A1C
Hgb A1c MFr Bld: 9.1 % — ABNORMAL HIGH (ref 4.8–5.6)
Mean Plasma Glucose: 214 mg/dL

## 2015-05-14 MED ORDER — SODIUM CHLORIDE 0.9% FLUSH
10.0000 mL | INTRAVENOUS | Status: DC | PRN
  Administered 2015-05-14 – 2015-05-15 (×2): 10 mL
  Filled 2015-05-14 (×2): qty 40

## 2015-05-14 MED ORDER — LEVOFLOXACIN 750 MG PO TABS: 750.0000 mg | ORAL_TABLET | Freq: Every day | ORAL | Status: DC

## 2015-05-14 MED ORDER — VANCOMYCIN HCL IN DEXTROSE 1-5 GM/200ML-% IV SOLN: 1000.0000 mg | Freq: Two times a day (BID) | INTRAVENOUS | Status: DC

## 2015-05-14 MED ORDER — LEVOFLOXACIN 500 MG PO TABS
750.0000 mg | ORAL_TABLET | Freq: Every day | ORAL | Status: DC
  Administered 2015-05-14 – 2015-05-15 (×2): 750 mg via ORAL
  Filled 2015-05-14 (×2): qty 2

## 2015-05-14 MED ORDER — INSULIN PUMP
Freq: Three times a day (TID) | SUBCUTANEOUS | Status: DC
  Administered 2015-05-15: 08:00:00 via SUBCUTANEOUS
  Filled 2015-05-14: qty 1

## 2015-05-14 NOTE — Progress Notes (Signed)
Inpatient Diabetes Program Recommendations  AACE/ADA: New Consensus Statement on Inpatient Glycemic Control (2015)  Target Ranges:  Prepandial:   less than 140 mg/dL      Peak postprandial:   less than 180 mg/dL (1-2 hours)      Critically ill patients:  140 - 180 mg/dL   Review of Glycemic Control  Diabetes history: DM 2 Outpatient Diabetes medications: Insulin pump Current orders for Inpatient glycemic control: Insulin Pump  Patient sees Dr. Reynaldo [facility] as a PCP for DM management. Last A1c pt remembers was 7.3 or 7.4%. Reviewed current A1c of 9.1% taken on 05/13/2015. Patient reports sometimes forgetting to cover meals with insulin pump. Stressed importance of controlling glucose levels with current infection and stress on her body. Patient agrees and is determined to cover her meals. Patient reported she was diagnosed in 2004 with DM. Both her mother and father had diabetes. It wasn't until she had a toe amputation that she got serious about controlling her glucose levels.  Patient last changed pump site yesterday 3/22. Next day to change site for insulin pump is Saturday 3/25. Patient has supplies at bedside. Patient has her own meter that communicates with her pump and calculates how much insulin to bolus herself for meals. Spoke with RN about checking glucose simultaneously with our meter to have a reading in EPIC. Collaborated with Pharmacist today to add insulin pump within the [facility] for RN to document insulin boluses with the meals.  Patient has a set basal rate of 2.75 units/hr. Total basal dose of 66 units.  Thanks,  Tama Headings RN, MSN, [facility] Inpatient Diabetes Coordinator Team Pager [phone] (8a-5p)

## 2015-05-14 NOTE — Progress Notes (Signed)
Peripherally Inserted Central Catheter/Midline Placement  The IV Nurse has discussed with the patient and/or persons authorized to consent for the patient, the purpose of this procedure and the potential benefits and risks involved with this procedure.  The benefits include less needle sticks, lab draws from the catheter and patient may be discharged home with the catheter.  Risks include, but not limited to, infection, bleeding, blood clot (thrombus formation), and puncture of an artery; nerve damage and irregular heat beat.  Alternatives to this procedure were also discussed.  PICC/Midline Placement Documentation  PICC Single Lumen 05/14/15 PICC Right Brachial 41 cm 0 cm (Active)  Indication for Insertion or Continuance of Line Home intravenous therapies (PICC only) 05/14/2015  1:32 PM  Exposed Catheter (cm) 0 cm 05/14/2015  1:32 PM  Dressing Change Due 05/21/15 05/14/2015  1:32 PM       Marianna Payment M 05/14/2015, 1:33 PM

## 2015-05-14 NOTE — Evaluation (Signed)
Physical Therapy Evaluation Patient Details Name: [name] MRN: [id number] DOB: 18-Jul-1955 Today's Date: 05/14/2015   History of Present Illness  Pt is a 60 y.o. female with a history of osteomyelitis of the lower extremity who underwent left partial knee replacement on 04/24/15. She had a fall, and an acute dehiscence of her wound. Pt underwent Left knee irrigation and debridement polyethylene exchange on 05/13/15. PMH: diabetes, osteomyelitis, hypertension, coronary artery disease, depression, anxiety, neuropathy.   Clinical Impression  Pt presenting with decreased mobility during PT initial session. She was able to ambulate 90 feet with min guard assist and rw. Currently requiring assistance with bed mobility and transfers. Pt reports that she is planning to D/C to home with family and friends to provide support. PT will continue to follow and progress mobility as tolerated.     Follow Up Recommendations Home health PT;Supervision for mobility/OOB    Equipment Recommendations  None recommended by PT    Recommendations for Other Services       Precautions / Restrictions Precautions Precautions: Knee;Fall Required Braces or Orthoses: Knee Immobilizer - Left Knee Immobilizer - Left: Other (comment) (no order for KI, nursing reports pt came from OR with it on.) Restrictions Weight Bearing Restrictions: Yes LLE Weight Bearing: Weight bearing as tolerated      Mobility  Bed Mobility Overal bed mobility: Needs Assistance Bed Mobility: Supine to Sit     Supine to sit: Min assist     General bed mobility comments: min assist with LLE, HOB elevated and using rails to assist  Transfers Overall transfer level: Needs assistance Equipment used: Rolling walker (2 wheeled) Transfers: Sit to/from Stand Sit to Stand: Mod assist         General transfer comment: performing sit/stand from bed and toilet. Cues for hand placement.   Ambulation/Gait Ambulation/Gait assistance:  Min guard Ambulation Distance (Feet): 90 Feet Assistive device: Rolling walker (2 wheeled) Gait Pattern/deviations: Step-through pattern;Decreased step length - right;Decreased step length - left Gait velocity: decreased   General Gait Details: Small strides but stable pattern, no loss of balance. Rw adjusted to correct height. Pt reports Rt knee as the primary limiting factor with ambulation.   Stairs            Wheelchair Mobility    Modified Rankin (Stroke Patients Only)       Balance Overall balance assessment: Needs assistance Sitting-balance support: No upper extremity supported Sitting balance-Leahy Scale: Good     Standing balance support: Bilateral upper extremity supported Standing balance-Leahy Scale: Poor Standing balance comment: using rw                             Pertinent Vitals/Pain Pain Assessment: 0-10 Pain Score: 3  Pain Location: Lt knee Pain Descriptors / Indicators: Sore Pain Intervention(s): Limited activity within patient's tolerance;Monitored during session;Ice applied    Home Living Family/patient expects to be discharged to:: Private residence Living Arrangements: Spouse/significant other Available Help at Discharge: Family Type of Home: House Home Access: Stairs to enter Entrance Stairs-Rails: Can reach both;Left;Right Entrance Stairs-Number of Steps: 2 Home Layout: One level Home Equipment: Walker - 2 wheels      Prior Function Level of Independence: Independent with assistive device(s)         Comments: reports being modified independent at home with ambulation. Husband assisting with household tasks.      Hand Dominance        Extremity/Trunk Assessment  Lower Extremity Assessment: LLE deficits/detail   LLE Deficits / Details: min assist needed for SLR     Communication   Communication: No difficulties  Cognition Arousal/Alertness: Awake/alert Behavior During Therapy: WFL for  tasks assessed/performed Overall Cognitive Status: Within Functional Limits for tasks assessed                      General Comments      Exercises        Assessment/Plan    PT Assessment Patient needs continued PT services  PT Diagnosis Difficulty walking;Generalized weakness;Acute pain   PT Problem List Decreased strength;Decreased range of motion;Decreased activity tolerance;Decreased balance;Decreased mobility  PT Treatment Interventions DME instruction;Gait training;Stair training;Functional mobility training;Therapeutic activities;Therapeutic exercise;Patient/family education   PT Goals (Current goals can be found in the Care Plan section) Acute Rehab PT Goals Patient Stated Goal: go home from the hospital PT Goal Formulation: With patient Time For Goal Achievement: 05/28/15 Potential to Achieve Goals: Good    Frequency Min 5X/week   Barriers to discharge        Co-evaluation               End of Session Equipment Utilized During Treatment: Gait belt;Left knee immobilizer Activity Tolerance: Patient tolerated treatment well Patient left: in chair;with call bell/phone within reach;with family/visitor present Nurse Communication: Mobility status;Weight bearing status         Time: 1111-1144 PT Time Calculation (min) (ACUTE ONLY): 33 min   Charges:   PT Evaluation $PT Eval Moderate Complexity: 1 Procedure PT Treatments $Gait Training: 8-22 mins   PT G Codes:        Cassell Clement, PT, CSCS Pager [phone] Office [phone]  05/14/2015, 12:06 PM

## 2015-05-14 NOTE — Consult Note (Addendum)
[facility]       Reason for Consult: probable PJI    Referring Physician: Dr. Mardelle Matte  Active Problems:   Infection of prosthetic left knee joint ([place])   . atorvastatin  20 mg Oral Daily  . baclofen  10 mg Oral TID  . buPROPion  200 mg Oral Daily  . escitalopram  40 mg Oral Daily  . gabapentin  300 mg Oral TID  . irbesartan  75 mg Oral Daily   And  . hydrochlorothiazide  12.5 mg Oral Daily  . levothyroxine  112 mcg Oral QAC breakfast  . rivaroxaban  10 mg Oral Q breakfast  . senna-docusate  2 tablet Oral Daily  . 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  No organisms seen and no growth to date.  Synovial fluid with 813 WBCs, no crystals.    Antibiotics: vancomycin  HPI: [name] is a 61 y.o. female with history of left partial knee replacement 3/3 and history of foot infection noted dehiscence of wound.  Seen by Dr. Mardelle Matte and noted some purulent smell superficially with no surrounding No fever, no chills.  With concern for infection, was taken to the OR yesterday for polyexchange.  Noted over 800 WBCs but no obvious purulence.  Gram stain, culture as above.  Previous OP report noted with procedure.    Review of Systems:  Constitutional: positive for pain or negative for fevers and chills Gastrointestinal: negative for diarrhea Integument/breast: negative for rash All other systems reviewed and are negative   Past Medical History  Diagnosis Date  . Type 2 diabetes mellitus (HCC)     insulin pump  . Hypertension   . OSA on CPAP     AHI = 43 (per patient)  . History of nuclear stress  test 12/16/2011    exercise myoview; normal images with 2-23m ST-segment depression - subsequent cath revelaed subtotally occluded small 2nd marginal branch & 75% PDA lesion, normal LV function  . Hypothyroidism   . Coronary artery disease   . Tobacco abuse   . Depression   . Anxiety   . [place]     in feet  . Arthritis     knees  . Fatty liver   . Personal history of kidney stones   . Primary localized osteoarthritis of left knee 04/24/2015    Social History  Substance Use Topics  . Smoking status: Current Some Day Smoker -- 0.25 packs/day for 25 years    Types: Cigarettes    Last Attempt to Quit: 12/15/2014  . Smokeless tobacco: Never Used  . Alcohol Use: No    Family History  Problem Relation Age of Onset  . Stroke Mother   . Hypertension Mother   . Diabetes Mother   . Alzheimer's disease Mother   . Diabetes Father   . COPD Father     vent-dependent, MODS  . Hypertension Sister   . Mental illness Sister     borderline personality d/o  . Mental illness Sister     schizoeffective d/o  . Diabetes Sister     No  Known Allergies  Physical Exam: Constitutional: in no apparent distress and alert  Filed Vitals:   05/14/15 0017 05/14/15 0422  BP: 129/66 115/54  Pulse: 86 76  Temp: 98.2 F (36.8 C) 98.4 F (36.9 C)  Resp: 19 16   EYES: anicteric ENMT:no thrush Cardiovascular: Cor RRR and No murmurs Respiratory: CTA B; normal respiratory effort GI: Bowel sounds are normal, liver is not enlarged, spleen is not enlarged Musculoskeletal: left leg wrapped, drain in place Skin: negatives: no rash Hematologic: no cervical lad  Lab Results  Component Value Date   WBC 10.0 05/14/2015   HGB 11.4* 05/14/2015   HCT 35.9* 05/14/2015   MCV 94.0 05/14/2015   PLT 241 05/14/2015    Lab Results  Component Value Date   CREATININE 0.77 05/14/2015   BUN 14 05/14/2015   NA 136 05/14/2015   K 4.1 05/14/2015   CL 100* 05/14/2015   CO2 26 05/14/2015    Lab  Results  Component Value Date   ALT 12* 01/21/2015   AST 14* 01/21/2015   ALKPHOS 105 01/21/2015     Microbiology: Recent Results (from the past 240 hour(s))  Anaerobic culture     Status: None (Preliminary result)   Collection Time: 05/13/15  2:50 PM  Result Value Ref Range Status   Specimen Description WOUND SYNOVIAL LEFT KNEE  Final   Special Requests PATIENT ON FOLLOWING VANC  Final   Gram Stain   Final    FEW WBC PRESENT,BOTH PMN AND MONONUCLEAR NO SQUAMOUS EPITHELIAL CELLS SEEN NO ORGANISMS SEEN Performed at [facility]    Culture PENDING  Incomplete   Report Status PENDING  Incomplete  Wound culture     Status: None (Preliminary result)   Collection Time: 05/13/15  2:50 PM  Result Value Ref Range Status   Specimen Description WOUND LEFT KNEE SYNOVIAL  Final   Special Requests PATIENT ON FOLLOWING VANC  Final   Gram Stain   Final    FEW WBC PRESENT,BOTH PMN AND MONONUCLEAR NO SQUAMOUS EPITHELIAL CELLS SEEN NO ORGANISMS SEEN Performed at [facility]    Culture PENDING  Incomplete   Report Status PENDING  Incomplete  Anaerobic culture     Status: None (Preliminary result)   Collection Time: 05/13/15  3:08 PM  Result Value Ref Range Status   Specimen Description TISSUE LEFT KNEE  Final   Special Requests NONE  Final   Gram Stain   Final    FEW WBC PRESENT, PREDOMINANTLY MONONUCLEAR NO ORGANISMS SEEN Performed at [facility]    Culture PENDING  Incomplete   Report Status PENDING  Incomplete  Tissue culture     Status: None (Preliminary result)   Collection Time: 05/13/15  3:08 PM  Result Value Ref Range Status   Specimen Description TISSUE LEFT KNEE  Final   Special Requests NONE  Final   Gram Stain   Final    FEW WBC PRESENT, PREDOMINANTLY MONONUCLEAR NO ORGANISMS SEEN Performed at [facility]    Culture PENDING  Incomplete   Report Status PENDING  Incomplete    COMER, Herbie [place], [facility] [email or web address] [phone] pager  [phone] cell 05/14/2015, 10:15 AM

## 2015-05-14 NOTE — Progress Notes (Signed)
[facility]  Patient Status:  New pt for [facility] this admission  [facility] is providing the following services: HHRN and Home Infusion Pharmacy team for home for home IV Vancomycin.  Met with pt tonight to review POC for home. Her husband will be the primary caregiver and is willing/capable per pt to administer IV ABX at home with teaching.  [facility] is prepared to support [name]'s DC home when ordered.   If patient discharges after hours, please call [phone].   Larry Sierras 05/14/2015, 5:33 PM

## 2015-05-14 NOTE — Progress Notes (Signed)
OT Cancellation Note  Patient Details Name: [name] MRN: [id number] DOB: 1955/06/07   Cancelled Treatment:    Reason Eval/Treat Not Completed: Patient at procedure or test/ unavailable. PICC line placement in progress. Will reattempt as schedule permits.   Hortencia Pilar 05/14/2015, 12:41 PM

## 2015-05-15 DIAGNOSIS — T8454XA Infection and inflammatory reaction due to internal left knee prosthesis, initial encounter: Secondary | ICD-10-CM | POA: Diagnosis not present

## 2015-05-15 LAB — CBC
HEMATOCRIT: 33.1 % — AB (ref 36.0–46.0)
Hemoglobin: 10.5 g/dL — ABNORMAL LOW (ref 12.0–15.0)
MCH: 29.8 pg (ref 26.0–34.0)
MCHC: 31.7 g/dL (ref 30.0–36.0)
MCV: 94 fL (ref 78.0–100.0)
Platelets: 234 10*3/uL (ref 150–400)
RBC: 3.52 MIL/uL — ABNORMAL LOW (ref 3.87–5.11)
RDW: 14.4 % (ref 11.5–15.5)
WBC: 9.1 10*3/uL (ref 4.0–10.5)

## 2015-05-15 LAB — GLUCOSE, CAPILLARY
GLUCOSE-CAPILLARY: 113 mg/dL — AB (ref 65–99)
GLUCOSE-CAPILLARY: 129 mg/dL — AB (ref 65–99)
Glucose-Capillary: 116 mg/dL — ABNORMAL HIGH (ref 65–99)

## 2015-05-15 NOTE — Discharge Summary (Signed)
Physician Discharge Summary  Patient ID: [name] MRN: [id number] DOB/AGE: 07/20/1955 60 y.o.  Admit date: 05/13/2015 Discharge date: 05/15/2015  Admission Diagnoses:  Superficial wound dehiscence, possible infected left prosthetic knee joint  Discharge Diagnoses:  Same Poorly controlled diabetes with neuropathy Tobacco abuse Morbid obesity Estimated body mass index is 38.36 kg/(m^2) as calculated from the following:   Height as of this encounter: 5\' 7"  (1.702 m).   Weight as of this encounter: 111.131 kg (245 lb).   Past Medical History  Diagnosis Date  . Type 2 diabetes mellitus (HCC)     insulin pump  . Hypertension   . OSA on CPAP     AHI = 43 (per patient)  . History of nuclear stress test 12/16/2011    exercise myoview; normal images with 2-75mm ST-segment depression - subsequent cath revelaed subtotally occluded small 2nd marginal branch & 75% PDA lesion, normal LV function  . Hypothyroidism   . Coronary artery disease   . Tobacco abuse   . Depression   . Anxiety   . Neuropathy ([place])     in feet  . Arthritis     knees  . Fatty liver   . Personal history of kidney stones   . Primary localized osteoarthritis of left knee 04/24/2015    Surgeries: Procedure(s): IRRIGATION AND DEBRIDEMENT KNEE WITH POLY EXCHANGE on 05/13/2015   Consultants (if any):  Scharlene Gloss with ID   Discharged Condition: Improved  Hospital Course: [name] is an 60 y.o. female who was admitted 05/13/2015 with a diagnosis of <principal problem not specified> and went to the operating room on 05/13/2015 and underwent the above named procedures.    She was given perioperative antibiotics:  Anti-infectives    Start     Dose/Rate Route Frequency Ordered Stop   05/14/15 1030  levofloxacin (LEVAQUIN) tablet 750 mg     750 mg Oral Daily 05/14/15 1023     05/14/15 0200  vancomycin (VANCOCIN) IVPB 1000 mg/200 mL premix     1,000 mg 200 mL/hr over 60 Minutes Intravenous Every 12 hours  05/13/15 1850     05/14/15 0000  levofloxacin (LEVAQUIN) 750 MG tablet     750 mg Oral Daily 05/14/15 1458     05/14/15 0000  vancomycin (VANCOCIN) 1 GM/200ML SOLN    Comments:  Dosing per home health and ID   1,000 mg 200 mL/hr over 60 Minutes Intravenous Every 12 hours 05/14/15 1458     05/13/15 1321  vancomycin (VANCOCIN) 1 GM/200ML IVPB    Comments:  Kerrie Pleasure   : cabinet override      05/13/15 1321 05/13/15 1404   05/13/15 1300  ceFAZolin (ANCEF) IVPB 2g/100 mL premix  Status:  Discontinued     2 g over 30 Minutes Intravenous On call to O.R. 05/13/15 1247 05/13/15 1832    .  She was given sequential compression devices, early ambulation, and xarelto for DVT prophylaxis.  Knee aspiration itself was not impressive for infection, but the wound had dehisced, possibly secondary to simple trauma.  I am treating her wound as a deep infection to be on the safe side, given her high risk for infection with history of poorly controlled dm, osteomyelitis of the foot, and tobacco abuse.  To date, cultures have not demonstrated deep infection.  Abx per Scharlene Gloss with ID.    She benefited maximally from the hospital stay.    Recent vital signs:  Filed Vitals:   05/14/15 [id number] 05/15/15 [id number]  BP: 134/55 119/57  Pulse: 86 71  Temp: 98.1 F (36.7 C) 98.6 F (37 C)  Resp: 16 16    Recent laboratory studies:  Lab Results  Component Value Date   HGB 10.5* 05/15/2015   HGB 11.4* 05/14/2015   HGB 13.2 05/13/2015   Lab Results  Component Value Date   WBC 9.1 05/15/2015   PLT 234 05/15/2015   Lab Results  Component Value Date   INR 0.87 05/13/2015   Lab Results  Component Value Date   NA 136 05/14/2015   K 4.1 05/14/2015   CL 100* 05/14/2015   CO2 26 05/14/2015   BUN 14 05/14/2015   CREATININE 0.77 05/14/2015   GLUCOSE 124* 05/14/2015    Discharge Medications:     Medication List    TAKE these medications        ACCU-CHEK AVIVA PLUS test strip  Generic drug:   glucose blood     ALPRAZolam 0.5 MG tablet  Commonly known as:  XANAX  Take 1 tablet by mouth 3 (three) times daily as needed.     atorvastatin 20 MG tablet  Commonly known as:  LIPITOR  Take 20 mg by mouth daily.     baclofen 10 MG tablet  Commonly known as:  LIORESAL  Take 1 tablet (10 mg total) by mouth 3 (three) times daily. As needed for muscle spasm     buPROPion 200 MG 12 hr tablet  Commonly known as:  WELLBUTRIN SR  Take 200 mg by mouth daily.     diclofenac sodium 1 % Gel  Commonly known as:  VOLTAREN  Apply 1 application topically 4 (four) times daily as needed.     escitalopram 20 MG tablet  Commonly known as:  LEXAPRO  Take 40 mg by mouth daily.     gabapentin 300 MG capsule  Commonly known as:  NEURONTIN  Take 300 mg by mouth 3 (three) times daily.     HUMALOG 100 UNIT/ML injection  Generic drug:  insulin lispro  Pt uses in her Insulin Pump     insulin pump Soln  Inject into the skin continuous. Humalog - basal rate 2.75/hr     levofloxacin 750 MG tablet  Commonly known as:  LEVAQUIN  Take 1 tablet (750 mg total) by mouth daily.     levothyroxine 112 MCG tablet  Commonly known as:  SYNTHROID, LEVOTHROID  Take 112 mcg by mouth daily before breakfast.     ondansetron 4 MG tablet  Commonly known as:  ZOFRAN  Take 1 tablet (4 mg total) by mouth every 8 (eight) hours as needed for nausea or vomiting.     oxyCODONE-acetaminophen 10-325 MG tablet  Commonly known as:  PERCOCET  Take 1-2 tablets by mouth every 6 (six) hours as needed for pain. MAXIMUM TOTAL ACETAMINOPHEN DOSE IS 4000 MG PER DAY     rivaroxaban 10 MG Tabs tablet  Commonly known as:  XARELTO  Take 1 tablet (10 mg total) by mouth daily.     sennosides-docusate sodium 8.6-50 MG tablet  Commonly known as:  SENOKOT-S  Take 2 tablets by mouth daily.     valsartan-hydrochlorothiazide 80-12.5 MG tablet  Commonly known as:  DIOVAN-HCT  Take 1 tablet by mouth daily.     vancomycin 1 GM/200ML  Soln  Commonly known as:  VANCOCIN  Inject 200 mLs (1,000 mg total) into the vein every 12 (twelve) hours.        Diagnostic Studies: Dg Knee Left Port  04/24/2015  CLINICAL DATA:  Portable postoperative knee from left unicompartmental joint replacement. EXAM: PORTABLE LEFT KNEE - 1-2 VIEW COMPARISON:  None in PACs FINDINGS: AP and lateral portable views reveal the presence of a unicompartmental knee joint prosthesis medially. The interface with the native bone appears normal. The lateral and patellofemoral portions of the knee exhibit no acute abnormalities. There is gas within the anterior soft tissues of the knee. IMPRESSION: The patient has undergone unicompartmental knee joint replacement medially without evidence of immediate post procedure complication. Electronically Signed   By: David  Martinique M.D.   On: 04/24/2015 10:43    Disposition: 01-Home or Self Care        Follow-up Information    Follow up with Johnny Bridge, MD. Schedule an appointment as soon as possible for a visit in 1 week.   Specialty:  Orthopedic Surgery   Contact information:   [place] [place] [address] [phone]        Signed: Johnny Bridge 05/15/2015, 11:24 AM

## 2015-05-15 NOTE — Progress Notes (Signed)
Physical Therapy Treatment Patient Details Name: [name] MRN: [id number] DOB: Oct 20, 1955 Today's Date: 05/15/2015    History of Present Illness Pt is a 60 y.o. female with a history of osteomyelitis of the lower extremity who underwent left partial knee replacement on 04/24/15. She had a fall, and an acute dehiscence of her wound. Pt underwent Left knee irrigation and debridement polyethylene exchange on 05/13/15. PMH: diabetes, osteomyelitis, hypertension, coronary artery disease, depression, anxiety, neuropathy.     PT Comments    Patient is making good progress with PT.  From a mobility standpoint anticipate patient will be ready for DC home when medically ready.     Follow Up Recommendations  Home health PT;Supervision for mobility/OOB     Equipment Recommendations  None recommended by PT    Recommendations for Other Services       Precautions / Restrictions Precautions Precautions: Knee;Fall Required Braces or Orthoses: Knee Immobilizer - Left Restrictions LLE Weight Bearing: Weight bearing as tolerated    Mobility  Bed Mobility               General bed mobility comments: OOB in chair upon arrival  Transfers Overall transfer level: Needs assistance Equipment used: Rolling walker (2 wheeled) Transfers: Sit to/from Stand Sit to Stand: Supervision         General transfer comment: supervision for safety; cues for hand placement  Ambulation/Gait Ambulation/Gait assistance: Supervision Ambulation Distance (Feet): 200 Feet Assistive device: Rolling walker (2 wheeled) Gait Pattern/deviations: Step-through pattern;Decreased stride length;Trunk flexed     General Gait Details: cues for position of RW and posture; no knee bukling; pt reported that R knee continues to be more painful than L knee   Stairs Stairs: Yes Stairs assistance: Supervision Stair Management: One rail Left;Forwards;Sideways Number of Stairs: 5 General stair comments: educated  pt on sequencing and technique; no knee instability noted  Wheelchair Mobility    Modified Rankin (Stroke Patients Only)       Balance                                    Cognition Arousal/Alertness: Awake/alert Behavior During Therapy: WFL for tasks assessed/performed Overall Cognitive Status: Within Functional Limits for tasks assessed                      Exercises      General Comments General comments (skin integrity, edema, etc.): reviewed positiong and use of ice      Pertinent Vitals/Pain Pain Assessment: No/denies pain Pain Score: 6  Pain Location: L knee Pain Descriptors / Indicators: Aching Pain Intervention(s): Monitored during session;Premedicated before session    Home Living Family/patient expects to be discharged to:: Private residence Living Arrangements: Spouse/significant other             Additional Comments: has not needed a BSC before; will sponge bathe until she can safely step into tub    Prior Function Level of Independence: Independent with assistive device(s)          PT Goals (current goals can now be found in the care plan section) Acute Rehab PT Goals Patient Stated Goal: go home from the hospital Progress towards PT goals: Progressing toward goals    Frequency  Min 5X/week    PT Plan Current plan remains appropriate    Co-evaluation             End of Session  Equipment Utilized During Treatment: Gait belt Activity Tolerance: Patient tolerated treatment well Patient left: in chair;with call bell/phone within reach;with family/visitor present;with nursing/sitter in room     Time: 1042-1056 PT Time Calculation (min) (ACUTE ONLY): 14 min  Charges:  $Gait Training: 8-22 mins                    G Codes:      Salina April, PTA Pager: [phone]   05/15/2015, 11:03 AM

## 2015-05-15 NOTE — Evaluation (Signed)
Occupational Therapy Evaluation Patient Details Name: [name] MRN: [id number] DOB: [birth year] Today's Date: 05/15/2015    History of Present Illness Pt is a 60 y.o. female with a history of osteomyelitis of the lower extremity who underwent left partial knee replacement on 04/24/15. She had a fall, and an acute dehiscence of her wound. Pt underwent Left knee irrigation and debridement polyethylene exchange on 05/13/15. PMH: diabetes, osteomyelitis, hypertension, coronary artery disease, depression, anxiety, neuropathy.    Clinical Impression   Pt was admitted for the above surgery.  Reviewed education and safety with her.  She does not need any further OT    Follow Up Recommendations  No OT follow up    Equipment Recommendations  None recommended by OT    Recommendations for Other Services       Precautions / Restrictions Precautions Precautions: Knee;Fall Required Braces or Orthoses: Knee Immobilizer - Left Restrictions Weight Bearing Restrictions: Yes LLE Weight Bearing: Weight bearing as tolerated      Mobility Bed Mobility               General bed mobility comments: pt was seated EOB  Transfers   Equipment used: Rolling walker (2 wheeled) Transfers: Sit to/from Stand Sit to Stand: Min guard         General transfer comment: for safety; cues to keep bil LEs within walker and to extend leg when sitting    Balance                                            ADL Overall ADL's : Needs assistance/impaired             Lower Body Bathing: Min guard;Sit to/from stand       Lower Body Dressing: Min guard;Sit to/from stand   Toilet Transfer: Min guard;Ambulation (to chair)             General ADL Comments: Min guard for safety when standing  Cue given to keep bil LEs within walker.  Pt did not need cues for hand placement but cue given to extend leg when seated.       Vision     Perception     Praxis       Pertinent Vitals/Pain Pain Score: 6  Pain Location: L knee Pain Descriptors / Indicators: Aching Pain Intervention(s): Limited activity within patient's tolerance;Monitored during session;Repositioned;RN gave pain meds during session     Hand Dominance     Extremity/Trunk Assessment Upper Extremity Assessment Upper Extremity Assessment: Overall WFL for tasks assessed           Communication Communication Communication: No difficulties   Cognition Arousal/Alertness: Awake/alert Behavior During Therapy: WFL for tasks assessed/performed Overall Cognitive Status: Within Functional Limits for tasks assessed                     General Comments       Exercises       Shoulder Instructions      Home Living Family/patient expects to be discharged to:: Private residence Living Arrangements: Spouse/significant other                 Bathroom Shower/Tub: Teacher, early years/pre: Handicapped height         Additional Comments: has not needed a BSC before; will sponge bathe until she can safely step into tub  Prior Functioning/Environment Level of Independence: Independent with assistive device(s)             OT Diagnosis: Acute pain   OT Problem List:     OT Treatment/Interventions:      OT Goals(Current goals can be found in the care plan section) Acute Rehab OT Goals Patient Stated Goal: go home from the hospital  OT Frequency:     Barriers to D/C:            Co-evaluation              End of Session    Activity Tolerance: Patient tolerated treatment well Patient left: in chair;with call bell/phone within reach   Time: [id number] OT Time Calculation (min): 14 min Charges:  OT General Charges $OT Visit: 1 Procedure OT Evaluation $OT Eval Low Complexity: 1 Procedure G-Codes:    Dimitri Dsouza June 01, 2015, 9:43 AM  Lesle Chris, OTR/L [phone] June 01, 2015

## 2015-05-16 LAB — TISSUE CULTURE

## 2015-05-16 LAB — WOUND CULTURE: CULTURE: NO GROWTH

## 2015-05-18 LAB — ANAEROBIC CULTURE

## 2015-05-19 ENCOUNTER — Ambulatory Visit
Admission: RE | Admit: 2015-05-19 | Discharge: 2015-05-19 | Disposition: A | Discharge status: Home / Self Care | Payer: 59 | Source: Ambulatory Visit

## 2015-05-19 DIAGNOSIS — Z1231 Encounter for screening mammogram for malignant neoplasm of breast: Secondary | ICD-10-CM

## 2015-06-16 NOTE — Anesthesia Preprocedure Evaluation (Addendum)
Anesthesia Evaluation  Patient identified by MRN, date of birth, ID band Patient awake    Reviewed: Allergy & Precautions, NPO status , Patient's Chart, lab work & pertinent test results  History of Anesthesia Complications Negative for: history of anesthetic complications  Airway Mallampati: III  TM Distance: >3 FB Neck ROM: Full    Dental no notable dental hx. (+) Dental Advisory Given   Pulmonary sleep apnea , Current Smoker,    Pulmonary exam normal breath sounds clear to auscultation       Cardiovascular hypertension, Pt. on medications + CAD  Normal cardiovascular exam Rhythm:Regular Rate:Normal     Neuro/Psych PSYCHIATRIC DISORDERS Anxiety Depression negative neurological ROS     GI/Hepatic negative GI ROS, Neg liver ROS,   Endo/Other  diabetesHypothyroidism obesity  Renal/GU negative Renal ROS  negative genitourinary   Musculoskeletal  (+) Arthritis ,   Abdominal   Peds negative pediatric ROS (+)  Hematology negative hematology ROS (+)   Anesthesia Other Findings Patient previously done here at day surgery with femoral nerve block and LMA. She did well after further discussion with patient and surgical team.  B Yong Wahlquist 06/18/15 re-evaluated with new information at 10:23   Reproductive/Obstetrics negative OB ROS                          Anesthesia Physical Anesthesia Plan  ASA: III  Anesthesia Plan: General   Post-op Pain Management: GA combined w/ Regional for post-op pain   Induction: Intravenous  Airway Management Planned: LMA  Additional Equipment:   Intra-op Plan:   Post-operative Plan: Extubation in OR  Informed Consent: I have reviewed the patients History and Physical, chart, labs and discussed the procedure including the risks, benefits and alternatives for the proposed anesthesia with the patient or authorized representative who has indicated his/her  understanding and acceptance.   Dental advisory given  Plan Discussed with: CRNA  Anesthesia Plan Comments:         Anesthesia Quick Evaluation

## 2015-06-18 ENCOUNTER — Encounter (HOSPITAL_BASED_OUTPATIENT_CLINIC_OR_DEPARTMENT_OTHER): Payer: Self-pay | Admitting: *Deleted

## 2015-06-19 ENCOUNTER — Other Ambulatory Visit: Payer: Self-pay | Admitting: Orthopedic Surgery

## 2015-06-23 ENCOUNTER — Telehealth: Payer: Self-pay

## 2015-06-23 ENCOUNTER — Encounter (HOSPITAL_BASED_OUTPATIENT_CLINIC_OR_DEPARTMENT_OTHER)
Admission: RE | Admit: 2015-06-23 | Discharge: 2015-06-23 | Disposition: A | Discharge status: Home / Self Care | Payer: 59 | Source: Ambulatory Visit | Attending: Orthopedic Surgery | Admitting: Orthopedic Surgery

## 2015-06-23 ENCOUNTER — Ambulatory Visit (INDEPENDENT_AMBULATORY_CARE_PROVIDER_SITE_OTHER): Payer: 59 | Admitting: Internal Medicine

## 2015-06-23 ENCOUNTER — Encounter: Payer: Self-pay | Admitting: Internal Medicine

## 2015-06-23 VITALS — BP 109/69 | HR 71 | Temp 97.6°F | Wt 242.0 lb

## 2015-06-23 DIAGNOSIS — Y839 Surgical procedure, unspecified as the cause of abnormal reaction of the patient, or of later complication, without mention of misadventure at the time of the procedure: Secondary | ICD-10-CM | POA: Diagnosis not present

## 2015-06-23 DIAGNOSIS — E1100 Type 2 diabetes mellitus with hyperosmolarity without nonketotic hyperglycemic-hyperosmolar coma (NKHHC): Secondary | ICD-10-CM

## 2015-06-23 DIAGNOSIS — Z01812 Encounter for preprocedural laboratory examination: Secondary | ICD-10-CM | POA: Diagnosis present

## 2015-06-23 DIAGNOSIS — T8454XA Infection and inflammatory reaction due to internal left knee prosthesis, initial encounter: Secondary | ICD-10-CM

## 2015-06-23 DIAGNOSIS — Z794 Long term (current) use of insulin: Secondary | ICD-10-CM | POA: Insufficient documentation

## 2015-06-23 LAB — SURGICAL PCR SCREEN
MRSA, PCR: NEGATIVE
Staphylococcus aureus: NEGATIVE

## 2015-06-23 MED ORDER — CEFUROXIME AXETIL 500 MG PO TABS: 500.0000 mg | ORAL_TABLET | Freq: Two times a day (BID) | ORAL | Status: DC

## 2015-06-23 NOTE — Progress Notes (Signed)
   Subjective:    Patient ID: [name], female    DOB: [birth year], 60 y.o.   MRN: [id number]  HPI Here for follow up of PJI.  Stefanny Pieri is a 60 y.o. female with history of left partial knee replacement 3/3 and history of foot infection noted dehiscence of wound. Seen by Dr. Mardelle Matte and noted some purulent smell superficially with no surrounding No fever, no chills. With concern for infection, was taken to the OR for polyexchange. Noted over 800 WBCs but no obvious purulence. Culture remained negative.  Has been on vancomycin and levequin for 6 weeks (finishing tomorrow).  CRP, ESR reassuring and other labs have been fine.  Knee doing well. Getting right knee replaced 5/11.  No fever,  No chills, no diarrhea.   Previous OP report noted with procedure.    Review of Systems  Constitutional: Negative for fatigue.  Gastrointestinal: Negative for diarrhea.  Skin: Negative for rash.  Neurological: Negative for dizziness and light-headedness.       Objective:   Physical Exam  Constitutional: She appears well-developed and well-nourished.  Eyes: No scleral icterus.  Musculoskeletal:  Left knee with well healed scar, no erythema, no warmth or swelling  Skin:  Right picc c/d/i          Assessment & Plan:

## 2015-06-23 NOTE — Telephone Encounter (Signed)
[place] and spoke with Coretta to give Dr. Linus Salmons verbal order to discontinue PICC line after last dose of antibiotic on 5/3. Rodman Key, LPN

## 2015-06-23 NOTE — Assessment & Plan Note (Signed)
Reports good control and checking regularly

## 2015-06-23 NOTE — Assessment & Plan Note (Addendum)
Possible infection and would like to complete with oral continuation therapy for 2-3 months.  Will use oral cephalosporin.  Pull picc per [facility] 5/3.   rtc 6-7 weeks Will check ESR, CRP then

## 2015-06-24 ENCOUNTER — Telehealth: Payer: Self-pay | Admitting: *Deleted

## 2015-06-24 NOTE — Telephone Encounter (Signed)
Verbal order per Dr. Linus Salmons given to Margaretha Sheffield, RN with [facility] to draw labs and then pull picc line today. Myrtis Hopping

## 2015-06-30 ENCOUNTER — Encounter: Payer: Self-pay | Admitting: Internal Medicine

## 2015-07-01 ENCOUNTER — Encounter: Payer: Self-pay | Admitting: Internal Medicine

## 2015-07-02 ENCOUNTER — Ambulatory Visit (HOSPITAL_BASED_OUTPATIENT_CLINIC_OR_DEPARTMENT_OTHER): Payer: 59 | Admitting: Anesthesiology

## 2015-07-02 ENCOUNTER — Encounter (HOSPITAL_BASED_OUTPATIENT_CLINIC_OR_DEPARTMENT_OTHER)
Admission: RE | Disposition: A | Discharge status: Home / Self Care | Payer: Self-pay | Source: Ambulatory Visit | Attending: Orthopedic Surgery

## 2015-07-02 ENCOUNTER — Ambulatory Visit (HOSPITAL_COMMUNITY): Payer: 59

## 2015-07-02 ENCOUNTER — Ambulatory Visit (HOSPITAL_BASED_OUTPATIENT_CLINIC_OR_DEPARTMENT_OTHER)
Admission: RE | Admit: 2015-07-02 | Discharge: 2015-07-03 | Disposition: A | Discharge status: Home / Self Care | Payer: 59 | Source: Ambulatory Visit | Attending: Orthopedic Surgery | Admitting: Orthopedic Surgery

## 2015-07-02 ENCOUNTER — Encounter (HOSPITAL_BASED_OUTPATIENT_CLINIC_OR_DEPARTMENT_OTHER): Payer: Self-pay | Admitting: Anesthesiology

## 2015-07-02 DIAGNOSIS — Z9989 Dependence on other enabling machines and devices: Secondary | ICD-10-CM | POA: Diagnosis not present

## 2015-07-02 DIAGNOSIS — E1140 Type 2 diabetes mellitus with diabetic neuropathy, unspecified: Secondary | ICD-10-CM | POA: Insufficient documentation

## 2015-07-02 DIAGNOSIS — Z6837 Body mass index (BMI) 37.0-37.9, adult: Secondary | ICD-10-CM | POA: Insufficient documentation

## 2015-07-02 DIAGNOSIS — F329 Major depressive disorder, single episode, unspecified: Secondary | ICD-10-CM | POA: Insufficient documentation

## 2015-07-02 DIAGNOSIS — Z89421 Acquired absence of other right toe(s): Secondary | ICD-10-CM | POA: Insufficient documentation

## 2015-07-02 DIAGNOSIS — F419 Anxiety disorder, unspecified: Secondary | ICD-10-CM | POA: Insufficient documentation

## 2015-07-02 DIAGNOSIS — Z794 Long term (current) use of insulin: Secondary | ICD-10-CM | POA: Insufficient documentation

## 2015-07-02 DIAGNOSIS — E669 Obesity, unspecified: Secondary | ICD-10-CM | POA: Diagnosis not present

## 2015-07-02 DIAGNOSIS — F17210 Nicotine dependence, cigarettes, uncomplicated: Secondary | ICD-10-CM | POA: Diagnosis not present

## 2015-07-02 DIAGNOSIS — Z96651 Presence of right artificial knee joint: Secondary | ICD-10-CM

## 2015-07-02 DIAGNOSIS — I2510 Atherosclerotic heart disease of native coronary artery without angina pectoris: Secondary | ICD-10-CM | POA: Insufficient documentation

## 2015-07-02 DIAGNOSIS — I10 Essential (primary) hypertension: Secondary | ICD-10-CM | POA: Insufficient documentation

## 2015-07-02 DIAGNOSIS — Z79899 Other long term (current) drug therapy: Secondary | ICD-10-CM | POA: Insufficient documentation

## 2015-07-02 DIAGNOSIS — G4733 Obstructive sleep apnea (adult) (pediatric): Secondary | ICD-10-CM | POA: Diagnosis not present

## 2015-07-02 DIAGNOSIS — Z96652 Presence of left artificial knee joint: Secondary | ICD-10-CM | POA: Diagnosis not present

## 2015-07-02 DIAGNOSIS — E039 Hypothyroidism, unspecified: Secondary | ICD-10-CM | POA: Diagnosis not present

## 2015-07-02 DIAGNOSIS — M1711 Unilateral primary osteoarthritis, right knee: Secondary | ICD-10-CM | POA: Diagnosis present

## 2015-07-02 HISTORY — DX: Unilateral primary osteoarthritis, right knee: M17.11

## 2015-07-02 HISTORY — PX: PARTIAL KNEE ARTHROPLASTY: SHX2174

## 2015-07-02 LAB — GLUCOSE, CAPILLARY
Glucose-Capillary: 121 mg/dL — ABNORMAL HIGH (ref 65–99)
Glucose-Capillary: 221 mg/dL — ABNORMAL HIGH (ref 65–99)
Glucose-Capillary: 200 mg/dL — ABNORMAL HIGH (ref 65–99)
Glucose-Capillary: 134 mg/dL — ABNORMAL HIGH (ref 65–99)

## 2015-07-02 SURGERY — ARTHROPLASTY, KNEE, UNICOMPARTMENTAL
Anesthesia: General | Site: Knee | Laterality: Right

## 2015-07-02 MED ORDER — METOCLOPRAMIDE HCL 5 MG PO TABS: 5.0000 mg | ORAL_TABLET | Freq: Three times a day (TID) | ORAL | Status: DC | PRN

## 2015-07-02 MED ORDER — VALSARTAN-HYDROCHLOROTHIAZIDE 80-12.5 MG PO TABS
1.0000 | ORAL_TABLET | Freq: Every day | ORAL | Status: DC
  Administered 2015-07-02: 1 via ORAL

## 2015-07-02 MED ORDER — LIDOCAINE 2% (20 MG/ML) 5 ML SYRINGE
INTRAMUSCULAR | Status: AC
  Filled 2015-07-02: qty 5

## 2015-07-02 MED ORDER — ZOLPIDEM TARTRATE 5 MG PO TABS
5.0000 mg | ORAL_TABLET | Freq: Every evening | ORAL | Status: DC | PRN
  Filled 2015-07-02: qty 1

## 2015-07-02 MED ORDER — PROPOFOL 10 MG/ML IV BOLUS
INTRAVENOUS | Status: DC | PRN
  Administered 2015-07-02: 150 mg via INTRAVENOUS
  Administered 2015-07-02: 20 mg via INTRAVENOUS

## 2015-07-02 MED ORDER — FENTANYL CITRATE (PF) 100 MCG/2ML IJ SOLN
INTRAMUSCULAR | Status: AC
  Filled 2015-07-02: qty 2

## 2015-07-02 MED ORDER — GLYCOPYRROLATE 0.2 MG/ML IJ SOLN: 0.2000 mg | Freq: Once | INTRAMUSCULAR | Status: DC | PRN

## 2015-07-02 MED ORDER — ONDANSETRON HCL 4 MG PO TABS: 4.0000 mg | ORAL_TABLET | Freq: Three times a day (TID) | ORAL | Status: DC | PRN

## 2015-07-02 MED ORDER — DEXAMETHASONE SODIUM PHOSPHATE 10 MG/ML IJ SOLN
INTRAMUSCULAR | Status: AC
  Filled 2015-07-02: qty 1

## 2015-07-02 MED ORDER — METOCLOPRAMIDE HCL 5 MG/ML IJ SOLN: 5.0000 mg | Freq: Three times a day (TID) | INTRAMUSCULAR | Status: DC | PRN

## 2015-07-02 MED ORDER — OXYCODONE HCL 5 MG/5ML PO SOLN: 5.0000 mg | Freq: Once | ORAL | Status: DC | PRN

## 2015-07-02 MED ORDER — CEFAZOLIN SODIUM-DEXTROSE 2-4 GM/100ML-% IV SOLN
2.0000 g | INTRAVENOUS | Status: AC
  Administered 2015-07-02: 2 g via INTRAVENOUS

## 2015-07-02 MED ORDER — ONDANSETRON HCL 4 MG/2ML IJ SOLN
INTRAMUSCULAR | Status: DC | PRN
  Administered 2015-07-02: 4 mg via INTRAVENOUS

## 2015-07-02 MED ORDER — PHENYLEPHRINE HCL 10 MG/ML IJ SOLN
INTRAMUSCULAR | Status: AC
  Filled 2015-07-02: qty 1

## 2015-07-02 MED ORDER — POLYETHYLENE GLYCOL 3350 17 G PO PACK: 17.0000 g | PACK | Freq: Every day | ORAL | Status: DC | PRN

## 2015-07-02 MED ORDER — SENNA 8.6 MG PO TABS
1.0000 | ORAL_TABLET | Freq: Two times a day (BID) | ORAL | Status: DC
  Administered 2015-07-02: 8.6 mg via ORAL
  Filled 2015-07-02: qty 1

## 2015-07-02 MED ORDER — INSULIN ASPART 100 UNIT/ML ~~LOC~~ SOLN: 0.0000 [IU] | Freq: Three times a day (TID) | SUBCUTANEOUS | Status: DC

## 2015-07-02 MED ORDER — ACETAMINOPHEN 325 MG PO TABS: 325.0000 mg | ORAL_TABLET | ORAL | Status: DC | PRN

## 2015-07-02 MED ORDER — BUPROPION HCL ER (SR) 100 MG PO TB12: 200.0000 mg | ORAL_TABLET | Freq: Every day | ORAL | Status: DC

## 2015-07-02 MED ORDER — METHOCARBAMOL 1000 MG/10ML IJ SOLN: 500.0000 mg | Freq: Four times a day (QID) | INTRAVENOUS | Status: DC | PRN

## 2015-07-02 MED ORDER — BUPIVACAINE-EPINEPHRINE (PF) 0.5% -1:200000 IJ SOLN
INTRAMUSCULAR | Status: DC | PRN
  Administered 2015-07-02: 25 mL

## 2015-07-02 MED ORDER — ONDANSETRON HCL 4 MG/2ML IJ SOLN: 4.0000 mg | Freq: Once | INTRAMUSCULAR | Status: DC | PRN

## 2015-07-02 MED ORDER — ALPRAZOLAM 0.5 MG PO TABS
0.5000 mg | ORAL_TABLET | Freq: Three times a day (TID) | ORAL | Status: DC | PRN
  Administered 2015-07-03: 0.5 mg via ORAL
  Filled 2015-07-02: qty 2

## 2015-07-02 MED ORDER — ONDANSETRON HCL 4 MG/2ML IJ SOLN
INTRAMUSCULAR | Status: AC
  Filled 2015-07-02: qty 2

## 2015-07-02 MED ORDER — OXYCODONE HCL 5 MG PO TABS
5.0000 mg | ORAL_TABLET | ORAL | Status: DC | PRN
  Administered 2015-07-02 (×3): 10 mg via ORAL
  Administered 2015-07-03: 5 mg via ORAL
  Filled 2015-07-02 (×4): qty 2

## 2015-07-02 MED ORDER — BACLOFEN 10 MG PO TABS: 10.0000 mg | ORAL_TABLET | Freq: Three times a day (TID) | ORAL | Status: DC

## 2015-07-02 MED ORDER — LIDOCAINE HCL (PF) 1 % IJ SOLN
INTRAMUSCULAR | Status: AC
Start: 2015-07-02 — End: 2015-07-02
  Filled 2015-07-02: qty 30

## 2015-07-02 MED ORDER — ACETAMINOPHEN 10 MG/ML IV SOLN
INTRAVENOUS | Status: AC
  Filled 2015-07-02: qty 100

## 2015-07-02 MED ORDER — CEFAZOLIN SODIUM-DEXTROSE 2-4 GM/100ML-% IV SOLN
INTRAVENOUS | Status: AC
  Filled 2015-07-02: qty 100

## 2015-07-02 MED ORDER — LEVOTHYROXINE SODIUM 112 MCG PO TABS: 112.0000 ug | ORAL_TABLET | Freq: Every day | ORAL | Status: DC

## 2015-07-02 MED ORDER — CEFAZOLIN SODIUM 1-5 GM-% IV SOLN
1.0000 g | Freq: Four times a day (QID) | INTRAVENOUS | Status: AC
  Administered 2015-07-02 – 2015-07-03 (×3): 1 g via INTRAVENOUS
  Filled 2015-07-02 (×3): qty 50

## 2015-07-02 MED ORDER — OXYCODONE HCL 5 MG PO TABS: 5.0000 mg | ORAL_TABLET | Freq: Once | ORAL | Status: DC | PRN

## 2015-07-02 MED ORDER — ONDANSETRON HCL 4 MG/2ML IJ SOLN: 4.0000 mg | Freq: Four times a day (QID) | INTRAMUSCULAR | Status: DC | PRN

## 2015-07-02 MED ORDER — MIDAZOLAM HCL 2 MG/2ML IJ SOLN
1.0000 mg | INTRAMUSCULAR | Status: DC | PRN
  Administered 2015-07-02: 1 mg via INTRAVENOUS

## 2015-07-02 MED ORDER — HYDROMORPHONE HCL 1 MG/ML IJ SOLN
0.2500 mg | INTRAMUSCULAR | Status: DC | PRN
  Administered 2015-07-02: 0.5 mg via INTRAVENOUS

## 2015-07-02 MED ORDER — ONDANSETRON HCL 4 MG PO TABS: 4.0000 mg | ORAL_TABLET | Freq: Four times a day (QID) | ORAL | Status: DC | PRN

## 2015-07-02 MED ORDER — MIDAZOLAM HCL 2 MG/2ML IJ SOLN
INTRAMUSCULAR | Status: AC
  Filled 2015-07-02: qty 2

## 2015-07-02 MED ORDER — SCOPOLAMINE 1 MG/3DAYS TD PT72: 1.0000 | MEDICATED_PATCH | Freq: Once | TRANSDERMAL | Status: DC | PRN

## 2015-07-02 MED ORDER — ESCITALOPRAM OXALATE 20 MG PO TABS: 40.0000 mg | ORAL_TABLET | Freq: Every day | ORAL | Status: DC

## 2015-07-02 MED ORDER — SODIUM CHLORIDE 0.9 % IV SOLN
INTRAVENOUS | Status: DC
  Administered 2015-07-02: 11:00:00 via INTRAVENOUS

## 2015-07-02 MED ORDER — CEFUROXIME AXETIL 500 MG PO TABS: 500.0000 mg | ORAL_TABLET | Freq: Two times a day (BID) | ORAL | Status: DC

## 2015-07-02 MED ORDER — ACETAMINOPHEN 10 MG/ML IV SOLN
1000.0000 mg | Freq: Once | INTRAVENOUS | Status: AC
  Administered 2015-07-02: 1000 mg via INTRAVENOUS

## 2015-07-02 MED ORDER — PHENYLEPHRINE HCL 10 MG/ML IJ SOLN
10.0000 mg | INTRAVENOUS | Status: DC | PRN
  Administered 2015-07-02: 50 ug/min via INTRAVENOUS

## 2015-07-02 MED ORDER — OXYCODONE-ACETAMINOPHEN 5-325 MG PO TABS
1.0000 | ORAL_TABLET | ORAL | Status: DC | PRN
  Administered 2015-07-02 – 2015-07-03 (×3): 2 via ORAL
  Filled 2015-07-02 (×3): qty 2

## 2015-07-02 MED ORDER — GABAPENTIN 300 MG PO CAPS
300.0000 mg | ORAL_CAPSULE | Freq: Three times a day (TID) | ORAL | Status: DC
  Administered 2015-07-03: 300 mg via ORAL
  Filled 2015-07-02: qty 1

## 2015-07-02 MED ORDER — OXYCODONE-ACETAMINOPHEN 10-325 MG PO TABS: 1.0000 | ORAL_TABLET | Freq: Four times a day (QID) | ORAL | Status: DC | PRN

## 2015-07-02 MED ORDER — LIDOCAINE HCL (CARDIAC) 20 MG/ML IV SOLN
INTRAVENOUS | Status: DC | PRN
  Administered 2015-07-02: 50 mg via INTRAVENOUS

## 2015-07-02 MED ORDER — FENTANYL CITRATE (PF) 100 MCG/2ML IJ SOLN
50.0000 ug | INTRAMUSCULAR | Status: AC | PRN
  Administered 2015-07-02 (×3): 25 ug via INTRAVENOUS
  Administered 2015-07-02: 50 ug via INTRAVENOUS
  Administered 2015-07-02: 25 ug via INTRAVENOUS

## 2015-07-02 MED ORDER — SENNA-DOCUSATE SODIUM 8.6-50 MG PO TABS: 2.0000 | ORAL_TABLET | Freq: Every day | ORAL | Status: DC

## 2015-07-02 MED ORDER — BISACODYL 10 MG RE SUPP: 10.0000 mg | Freq: Every day | RECTAL | Status: DC | PRN

## 2015-07-02 MED ORDER — DOCUSATE SODIUM 100 MG PO CAPS
100.0000 mg | ORAL_CAPSULE | Freq: Two times a day (BID) | ORAL | Status: DC
  Administered 2015-07-02: 100 mg via ORAL
  Filled 2015-07-02: qty 1

## 2015-07-02 MED ORDER — PROPOFOL 500 MG/50ML IV EMUL
INTRAVENOUS | Status: AC
  Filled 2015-07-02: qty 50

## 2015-07-02 MED ORDER — LACTATED RINGERS IV SOLN
INTRAVENOUS | Status: DC
  Administered 2015-07-02: 07:00:00 via INTRAVENOUS

## 2015-07-02 MED ORDER — MAGNESIUM CITRATE PO SOLN: 1.0000 | Freq: Once | ORAL | Status: DC | PRN

## 2015-07-02 MED ORDER — METHOCARBAMOL 500 MG PO TABS
500.0000 mg | ORAL_TABLET | Freq: Four times a day (QID) | ORAL | Status: DC | PRN
  Administered 2015-07-02 – 2015-07-03 (×3): 500 mg via ORAL
  Filled 2015-07-02 (×3): qty 1

## 2015-07-02 MED ORDER — FENTANYL CITRATE (PF) 100 MCG/2ML IJ SOLN
25.0000 ug | INTRAMUSCULAR | Status: DC | PRN
  Administered 2015-07-02: 25 ug via INTRAVENOUS
  Administered 2015-07-02 (×2): 50 ug via INTRAVENOUS
  Administered 2015-07-02: 25 ug via INTRAVENOUS

## 2015-07-02 MED ORDER — BUPIVACAINE HCL (PF) 0.5 % IJ SOLN
INTRAMUSCULAR | Status: AC
Start: 2015-07-02 — End: 2015-07-02
  Filled 2015-07-02: qty 30

## 2015-07-02 MED ORDER — HYDROMORPHONE HCL 1 MG/ML IJ SOLN
INTRAMUSCULAR | Status: AC
  Filled 2015-07-02: qty 1

## 2015-07-02 MED ORDER — DEXAMETHASONE SODIUM PHOSPHATE 4 MG/ML IJ SOLN
INTRAMUSCULAR | Status: DC | PRN
  Administered 2015-07-02: 10 mg via INTRAVENOUS

## 2015-07-02 MED ORDER — INSULIN PUMP: SUBCUTANEOUS | Status: DC

## 2015-07-02 MED ORDER — HYDROMORPHONE HCL 1 MG/ML IJ SOLN
0.5000 mg | INTRAMUSCULAR | Status: DC | PRN
  Administered 2015-07-02 (×2): 1 mg via INTRAVENOUS
  Administered 2015-07-02: 0.5 mg via INTRAVENOUS
  Administered 2015-07-03 (×3): 1 mg via INTRAVENOUS
  Filled 2015-07-02 (×6): qty 1

## 2015-07-02 MED ORDER — ATORVASTATIN CALCIUM 20 MG PO TABS: 20.0000 mg | ORAL_TABLET | Freq: Every day | ORAL | Status: DC

## 2015-07-02 MED ORDER — ACETAMINOPHEN 160 MG/5ML PO SOLN: 325.0000 mg | ORAL | Status: DC | PRN

## 2015-07-02 SURGICAL SUPPLY — 65 items
BANDAGE ACE 6X5 VEL STRL LF (GAUZE/BANDAGES/DRESSINGS) ×2 IMPLANT
BANDAGE ESMARK 6X9 LF (GAUZE/BANDAGES/DRESSINGS) ×1 IMPLANT
BLADE SURG 10 STRL SS (BLADE) ×2 IMPLANT
BLADE SURG 15 STRL LF DISP TIS (BLADE) ×2 IMPLANT
BLADE SURG 15 STRL SS (BLADE) ×4
BNDG CMPR 9X6 STRL LF SNTH (GAUZE/BANDAGES/DRESSINGS) ×1
BNDG ESMARK 6X9 LF (GAUZE/BANDAGES/DRESSINGS) ×2
BOWL SMART MIX CTS (DISPOSABLE) ×3 IMPLANT
CANISTER SUCT 1200ML W/VALVE (MISCELLANEOUS) IMPLANT
CANISTER SUCTION 2500CC (MISCELLANEOUS) ×2 IMPLANT
CAPT KNEE PARTIAL 2 ×1 IMPLANT
CEMENT HV SMART SET (Cement) ×2 IMPLANT
CLSR STERI-STRIP ANTIMIC 1/2X4 (GAUZE/BANDAGES/DRESSINGS) ×2 IMPLANT
COVER BACK TABLE 60X90IN (DRAPES) ×2 IMPLANT
CUFF TOURNIQUET SINGLE 34IN LL (TOURNIQUET CUFF) IMPLANT
DECANTER SPIKE VIAL GLASS SM (MISCELLANEOUS) IMPLANT
DRAPE EXTREMITY T 121X128X90 (DRAPE) ×2 IMPLANT
DRAPE IMP U-DRAPE 54X76 (DRAPES) ×2 IMPLANT
DRAPE U-SHAPE 47X51 STRL (DRAPES) ×2 IMPLANT
DRSG PAD ABDOMINAL 8X10 ST (GAUZE/BANDAGES/DRESSINGS) ×2 IMPLANT
DURAPREP 26ML APPLICATOR (WOUND CARE) ×2 IMPLANT
ELECT REM PT RETURN 9FT ADLT (ELECTROSURGICAL) ×2
ELECTRODE REM PT RTRN 9FT ADLT (ELECTROSURGICAL) ×1 IMPLANT
FACESHIELD WRAPAROUND (MASK) ×4 IMPLANT
FACESHIELD WRAPAROUND OR TEAM (MASK) ×2 IMPLANT
GAUZE SPONGE 4X4 12PLY STRL (GAUZE/BANDAGES/DRESSINGS) ×2 IMPLANT
GLOVE BIO SURGEON STRL SZ 6.5 (GLOVE) ×1 IMPLANT
GLOVE BIO SURGEON STRL SZ8 (GLOVE) ×2 IMPLANT
GLOVE BIOGEL PI IND STRL 7.0 (GLOVE) IMPLANT
GLOVE BIOGEL PI IND STRL 8 (GLOVE) ×2 IMPLANT
GLOVE BIOGEL PI INDICATOR 7.0 (GLOVE) ×1
GLOVE BIOGEL PI INDICATOR 8 (GLOVE) ×3
GLOVE ORTHO TXT STRL SZ7.5 (GLOVE) ×2 IMPLANT
GOWN STRL REUS W/ TWL LRG LVL3 (GOWN DISPOSABLE) ×1 IMPLANT
GOWN STRL REUS W/ TWL XL LVL3 (GOWN DISPOSABLE) ×2 IMPLANT
GOWN STRL REUS W/TWL LRG LVL3 (GOWN DISPOSABLE) ×2
GOWN STRL REUS W/TWL XL LVL3 (GOWN DISPOSABLE) ×4
HANDPIECE INTERPULSE COAX TIP (DISPOSABLE) ×2
IMMOBILIZER KNEE 22 UNIV (SOFTGOODS) ×2 IMPLANT
IMMOBILIZER KNEE 24 THIGH 36 (MISCELLANEOUS) ×1 IMPLANT
IMMOBILIZER KNEE 24 UNIV (MISCELLANEOUS) ×2
KIT SAW BLADE (KITS) ×2 IMPLANT
MANIFOLD NEPTUNE II (INSTRUMENTS) ×2 IMPLANT
NS IRRIG 1000ML POUR BTL (IV SOLUTION) ×2 IMPLANT
PACK ARTHROSCOPY DSU (CUSTOM PROCEDURE TRAY) ×2 IMPLANT
PACK BASIN DAY SURGERY FS (CUSTOM PROCEDURE TRAY) ×2 IMPLANT
PENCIL BUTTON HOLSTER BLD 10FT (ELECTRODE) ×2 IMPLANT
SET HNDPC FAN SPRY TIP SCT (DISPOSABLE) ×1 IMPLANT
SHEET MEDIUM DRAPE 40X70 STRL (DRAPES) ×2 IMPLANT
SLEEVE SCD COMPRESS KNEE MED (MISCELLANEOUS) ×2 IMPLANT
SPONGE LAP 18X18 X RAY DECT (DISPOSABLE) ×2 IMPLANT
SUCTION FRAZIER HANDLE 10FR (MISCELLANEOUS) ×1
SUCTION TUBE FRAZIER 10FR DISP (MISCELLANEOUS) ×1 IMPLANT
SUT MNCRL AB 4-0 PS2 18 (SUTURE) IMPLANT
SUT VIC AB 0 CT1 27 (SUTURE) ×2
SUT VIC AB 0 CT1 27XBRD ANBCTR (SUTURE) ×1 IMPLANT
SUT VIC AB 2-0 SH 27 (SUTURE) ×2
SUT VIC AB 2-0 SH 27XBRD (SUTURE) ×1 IMPLANT
SUT VICRYL 3-0 CR8 SH (SUTURE) ×2 IMPLANT
SUT VICRYL 4-0 PS2 18IN ABS (SUTURE) IMPLANT
SYR BULB IRRIGATION 50ML (SYRINGE) ×2 IMPLANT
TOWEL OR 17X24 6PK STRL BLUE (TOWEL DISPOSABLE) ×2 IMPLANT
TOWEL OR NON WOVEN STRL DISP B (DISPOSABLE) ×4 IMPLANT
TUBE CONNECTING 20X1/4 (TUBING) ×1 IMPLANT
YANKAUER SUCT BULB TIP NO VENT (SUCTIONS) IMPLANT

## 2015-07-02 NOTE — Anesthesia Procedure Notes (Addendum)
Anesthesia Regional Block:  Adductor canal block  Pre-Anesthetic Checklist: ,, timeout performed, Correct Patient, Correct Site, Correct Laterality, Correct Procedure, Correct Position, site marked, Risks and benefits discussed,  Surgical consent,  Pre-op evaluation,  At surgeon's request and post-op pain management  Laterality: Right  Prep: Maximum Sterile Barrier Precautions used and chloraprep       Needles:  Injection technique: Single-shot  Needle Type: Echogenic Stimulator Needle     Needle Length: 10cm 10 cm Needle Gauge: 21 and 21 G    Additional Needles:  Procedures: ultrasound guided (picture in chart) and nerve stimulator Adductor canal block Narrative:  Injection made incrementally with aspirations every 5 mL.  Performed by: Personally  Anesthesiologist: JUDD, MARY  Additional Notes: Patient tolerated the procedure well without complications. Yeast in groin so adductor canal block performed instead of femoral block   Procedure Name: LMA Insertion Performed by: Terrance Mass Pre-anesthesia Checklist: Patient identified, Emergency Drugs available, Suction available and Patient being monitored Patient Re-evaluated:Patient Re-evaluated prior to inductionOxygen Delivery Method: Circle System Utilized Preoxygenation: Pre-oxygenation with 100% oxygen Intubation Type: IV induction Ventilation: Mask ventilation without difficulty LMA: LMA inserted LMA Size: 4.0 Number of attempts: 1 Placement Confirmation: positive ETCO2 Tube secured with: Tape Dental Injury: Teeth and Oropharynx as per pre-operative assessment

## 2015-07-02 NOTE — Progress Notes (Signed)
Assisted Dr. Lauretta Grill with right, adductor canal block. Side rails up, monitors on throughout procedure. See vital signs in flow sheet. Tolerated Procedure well.

## 2015-07-02 NOTE — Op Note (Signed)
07/02/2015  8:47 AM  PATIENT:  [name]    PRE-OPERATIVE DIAGNOSIS:  UNILATERAL PRIMARY OSTEOARTHRITIS RIGHT KNEE  POST-OPERATIVE DIAGNOSIS:  Same  PROCEDURE:  RIGHT UNI KNEE ARTHROPLASTY  SURGEON:  Johnny Bridge, MD  PHYSICIAN ASSISTANT: Joya Gaskins, OPA-C, present and scrubbed throughout the case, critical for completion in a timely fashion, and for retraction, instrumentation, and closure.  ANESTHESIA:   General  PREOPERATIVE INDICATIONS:  [name] is a  60 y.o. female with a diagnosis of UNILATERAL PRIMARY OSTEOARTHRITIS RIGHT KNEE who failed conservative measures and elected for surgical management.    The risks benefits and alternatives were discussed with the patient preoperatively including but not limited to the risks of infection, bleeding, nerve injury, cardiopulmonary complications, blood clots, the need for revision surgery, among others, and the patient was willing to proceed.  OPERATIVE IMPLANTS: Biomet [employer] mobile bearing medial compartment arthroplasty femur size small, tibia size AA, bearing size 6.  OPERATIVE FINDINGS: Endstage grade 4 medial compartment osteoarthritis. No significant changes in the lateral or patellofemoral joint.  The ACL was intact. There was substantial erosion of the medial tibial condyle, with extreme bone loss. There was extremely poor quality bone, both on the femur and tibia.  Unique aspects of the case: I did cut the tibia twice, because the first cut did not actually remove bone on the medial half of the tibial condyle because of the extreme bone loss and loss of condyle. The femoral bone also was extremely weak, and in fact did not really even hold the guide post very well. I had to cut the tibia at 0, without a shim, in order to even get down to adequate bone coverage. This made the tibial component smaller, and I even still had still some tibial defect with cement. The polyethylene final placement was on the loose side,  but appeared stable in deep flexion and extension and tracked well.  OPERATIVE PROCEDURE: The patient was brought to the operating room placed in supine position. General anesthesia was administered. IV antibiotics were given. The lower extremity was placed in the legholder and prepped and draped in usual sterile fashion.  Time out was performed.  The leg was elevated and exsanguinated and the tourniquet was inflated. Anteromedial incision was performed, and I took care to preserve the MCL. Parapatellar incision was carried out, and the osteophytes were excised, along with the medial meniscus and a small portion of the fat pad.  The extra medullary tibial cutting jig was applied, using the spoon and the 68mm G-Clamp and the 2 mm shim, but this did not get much of a cut on the medial aspect, and I had to do a second cut at 0 mm in order to get down to the level of bone loss.  I took care to protect the anterior cruciate ligament insertion and the tibial spine. The medial collateral ligament was also protected, and I resected my proximal tibia, matching the anatomic slope.   The proximal tibial bony cut was removed, and I turned my attention to the femur.  The intramedullary femoral rod was placed using the drill, and then using the appropriate reference, I assembled the femoral jig, setting my posterior cutting block. I resected my posterior femur, used the 0 spigot for the anterior femur, and then measured my gap.   I then used the appropriate mill to match the extension gap to the flexion gap. The second milling was at a 5.  The gaps were then measured again with the  appropriate feeler gauges. Once I had balanced flexion and extension gaps, I then completed the preparation of the femur.  I milled off the anterior aspect of the distal femur to prevent impingement. I also exposed the tibia, and selected the above-named component, and then used the cutting jig to prepare the keel slot on the tibia. I  also used the awl to curette out the bone to complete the preparation of the keel. The back wall was intact.  I then placed trial components, and it was found to have excellent motion, and appropriate balance.  I then cemented the components into place, cementing the tibia first, removing all excess cement, and then cementing the femur.  All loose cement was removed.  The real polyethylene insert was applied manually, and the knee was taken through functional range of motion, and found to have excellent stability and restoration of joint motion, with excellent balance.  The wounds were irrigated copiously, and the parapatellar tissue closed with Vicryl, followed by Vicryl for the subcutaneous tissue, with routine closure with Steri-Strips and sterile gauze.  The tourniquet was released, and the patient was awakened and extubated and returned to PACU in stable and satisfactory condition. There were no complications.

## 2015-07-02 NOTE — Transfer of Care (Signed)
Immediate Anesthesia Transfer of Care Note  Patient: [name]  Procedure(s) Performed: Procedure(s) with comments: RIGHT UNI KNEE ARTHROPLASTY (Right) - ANESTHESIA:  GENERAL, PRE/POST OP FEMORAL NERVE  Patient Location: PACU  Anesthesia Type:General  Level of Consciousness: awake and sedated  Airway & Oxygen Therapy: Patient Spontanous Breathing and Patient connected to face mask oxygen  Post-op Assessment: Report given to RN and Post -op Vital signs reviewed and stable  Post vital signs: Reviewed and stable  Last Vitals:  Filed Vitals:   07/02/15 0720 07/02/15 0917  BP:    Pulse: 70 92  Temp:    Resp: 16 25    Last Pain: There were no vitals filed for this visit.       Complications: No apparent anesthesia complications

## 2015-07-02 NOTE — Anesthesia Postprocedure Evaluation (Signed)
Anesthesia Post Note  Patient: [name]  Procedure(s) Performed: Procedure(s) (LRB): RIGHT UNI KNEE ARTHROPLASTY (Right)  Patient location during evaluation: PACU Anesthesia Type: General Level of consciousness: awake and alert Pain management: pain level controlled Vital Signs Assessment: post-procedure vital signs reviewed and stable Respiratory status: spontaneous breathing, nonlabored ventilation, respiratory function stable and patient connected to nasal cannula oxygen Cardiovascular status: blood pressure returned to baseline and stable Postop Assessment: no signs of nausea or vomiting Anesthetic complications: no    Last Vitals:  Filed Vitals:   07/02/15 1245 07/02/15 1345  BP: 154/74 160/77  Pulse: 80 84  Temp: 36.4 C 36.7 C  Resp: 18 20    Last Pain:  Filed Vitals:   07/02/15 1559  PainSc: Asleep                 Kasheem Toner JENNETTE

## 2015-07-02 NOTE — H&P (Signed)
PREOPERATIVE H&P  Chief Complaint: UNILATERAL PRIMARY OSTEOARTHRITIS RIGHT KNEE  HPI: [name] is a 60 y.o. female who presents for preoperative history and physical with a diagnosis of UNILATERAL PRIMARY OSTEOARTHRITIS RIGHT KNEE. Symptoms are rated as moderate to severe, and have been worsening.  This is significantly impairing activities of daily living.  She has elected for surgical management.   She has failed injections, activity modification, anti-inflammatories, and assistive devices.  Preoperative X-rays demonstrate end stage degenerative changes with osteophyte formation, loss of joint space, subchondral sclerosis.  She had a contralateral partial knee replacement, which did well until she fell and split the wound open, there was concern about infection and she was treated aggressively given her high risk given her diabetes, however ultimately it healed with excellent result, unlikely deep infection, more superficial traumatic wound dehiscence from trauma.  Past Medical History  Diagnosis Date  . Type 2 diabetes mellitus (HCC)     insulin pump  . Hypertension   . OSA on CPAP     AHI = 43 (per patient)  . History of nuclear stress test 12/16/2011    exercise myoview; normal images with 2-55mm ST-segment depression - subsequent cath revelaed subtotally occluded small 2nd marginal branch & 75% PDA lesion, normal LV function  . Hypothyroidism   . Coronary artery disease   . Tobacco abuse   . Depression   . Anxiety   . Neuropathy ([place])     in feet  . Arthritis     knees  . Fatty liver   . Personal history of kidney stones   . Primary localized osteoarthritis of left knee 04/24/2015   Past Surgical History  Procedure Laterality Date  . Cardiac catheterization  01/04/2012    subtotally occluded small 2nd marginal branch & 75% PDA lesion, normal LV function  . Cholecystectomy  2008  . Cataract extraction Bilateral     with lens implants  . Breast enhancement surgery  Bilateral   . Left heart catheterization with coronary angiogram N/A 01/04/2012    Procedure: LEFT HEART CATHETERIZATION WITH CORONARY ANGIOGRAM;  Surgeon: Lorretta Harp, MD;  Location: [facility] CATH LAB;  Service: Cardiovascular;  Laterality: N/A;  . Colonoscopy    . Posterior cervical fusion/foraminotomy    . Amputation Right 12/22/2014    Procedure: RIGHT 2ND TOE AMPUTATION;  Surgeon: [name], MD;  Location: [place];  Service: Orthopedics;  Laterality: Right;  . Robotic assisted total hysterectomy with bilateral salpingo oopherectomy Bilateral 02/17/2015    Procedure: ROBOTIC ASSISTED TOTAL HYSTERECTOMY WITH BILATERAL SALPINGO OOPHORECTOMY;  Surgeon: Everitt Amber, MD;  Location: [facility];  Service: Gynecology;  Laterality: Bilateral;  . Partial knee arthroplasty Left 04/24/2015    Procedure: LEFT UNICOMPARTMENTAL KNEE ARTHROPLASTY ;  Surgeon: Marchia Bond, MD;  Location: [place];  Service: Orthopedics;  Laterality: Left;  . I&d knee with poly exchange Left 05/13/2015    Procedure: IRRIGATION AND DEBRIDEMENT KNEE WITH POLY EXCHANGE;  Surgeon: Marchia Bond, MD;  Location: [facility];  Service: Orthopedics;  Laterality: Left;   Social History   Social History  . Marital Status: Married    Spouse Name: N/A  . Number of Children: N/A  . Years of Education: 14   Occupational History  . respiratory therapist Other    [employer]   Social History Main Topics  . Smoking status: Current Some Day Smoker -- 0.25 packs/day for 25 years    Types: Cigarettes    Last Attempt to Quit: 12/15/2014  .  Smokeless tobacco: Never Used  . Alcohol Use: No  . Drug Use: No  . Sexual Activity: Not Asked   Other Topics Concern  . None   Social History Narrative   Family History  Problem Relation Age of Onset  . Stroke Mother   . Hypertension Mother   . Diabetes Mother   . Alzheimer's disease Mother   . Diabetes Father   . COPD Father     vent-dependent, MODS  . Hypertension  Sister   . Mental illness Sister     borderline personality d/o  . Mental illness Sister     schizoeffective d/o  . Diabetes Sister    No Known Allergies Prior to Admission medications   Medication Sig Start Date End Date Taking? Authorizing Provider  ACCU-CHEK AVIVA PLUS test strip  12/19/14  Yes Historical Provider, MD  ALPRAZolam Duanne Moron) 0.5 MG tablet Take 1 tablet by mouth 3 (three) times daily as needed. 05/07/15  Yes Historical Provider, MD  atorvastatin (LIPITOR) 20 MG tablet Take 20 mg by mouth daily. 10/12/14  Yes Historical Provider, MD  baclofen (LIORESAL) 10 MG tablet Take 1 tablet (10 mg total) by mouth 3 (three) times daily. As needed for muscle spasm 05/13/15  Yes Marchia Bond, MD  buPROPion [facility] SR) 200 MG 12 hr tablet Take 200 mg by mouth daily.  08/26/13  Yes Historical Provider, MD  diclofenac sodium (VOLTAREN) 1 % GEL Apply 1 application topically 4 (four) times daily as needed.    Yes Historical Provider, MD  escitalopram (LEXAPRO) 20 MG tablet Take 40 mg by mouth daily.   Yes Historical Provider, MD  gabapentin (NEURONTIN) 300 MG capsule Take 300 mg by mouth 3 (three) times daily. 10/12/14  Yes Historical Provider, MD  HUMALOG 100 UNIT/ML injection Pt uses in her Insulin Pump 12/19/14  Yes Historical Provider, MD  Insulin Human (INSULIN PUMP) SOLN Inject into the skin continuous. Humalog - basal rate 2.75/hr   Yes Historical Provider, MD  levothyroxine (SYNTHROID, LEVOTHROID) 112 MCG tablet Take 112 mcg by mouth daily before breakfast. 10/12/14  Yes Historical Provider, MD  ondansetron (ZOFRAN) 4 MG tablet Take 1 tablet (4 mg total) by mouth every 8 (eight) hours as needed for nausea or vomiting. 04/24/15  Yes Marchia Bond, MD  sennosides-docusate sodium (SENOKOT-S) 8.6-50 MG tablet Take 2 tablets by mouth daily. 04/24/15  Yes Marchia Bond, MD  valsartan-hydrochlorothiazide (DIOVAN-HCT) 80-12.5 MG per tablet Take 1 tablet by mouth daily.   Yes Historical Provider, MD   cefUROXime (CEFTIN) 500 MG tablet Take 1 tablet (500 mg total) by mouth 2 (two) times daily with a meal. 06/23/15   Thayer Headings, MD     Positive ROS: All other systems have been reviewed and were otherwise negative with the exception of those mentioned in the HPI and as above.  Physical Exam: General: Alert, no acute distress Cardiovascular: No pedal edema Respiratory: No cyanosis, no use of accessory musculature GI: No organomegaly, abdomen is soft and non-tender Skin: No lesions in the area of chief complaint Neurologic: Sensation intact distally Psychiatric: Patient is competent for consent with normal mood and affect Lymphatic: No axillary or cervical lymphadenopathy  MUSCULOSKELETAL: Right knee has varus alignment with crepitance and pseudo-laxity and medial pain range of motion 0-120.  Assessment: UNILATERAL PRIMARY OSTEOARTHRITIS RIGHT KNEE   Plan: Plan for Procedure(s): RIGHT UNI KNEE ARTHROPLASTY  The risks benefits and alternatives were discussed with the patient including but not limited to the risks of nonoperative treatment,  versus surgical intervention including infection, bleeding, nerve injury,  blood clots, cardiopulmonary complications, morbidity, mortality, among others, and they were willing to proceed.   Johnny Bridge, MD Cell [phone]   07/02/2015 7:13 AM

## 2015-07-03 ENCOUNTER — Encounter (HOSPITAL_BASED_OUTPATIENT_CLINIC_OR_DEPARTMENT_OTHER): Payer: Self-pay | Admitting: Orthopedic Surgery

## 2015-07-03 DIAGNOSIS — M1711 Unilateral primary osteoarthritis, right knee: Secondary | ICD-10-CM | POA: Diagnosis not present

## 2015-07-03 LAB — GLUCOSE, CAPILLARY: GLUCOSE-CAPILLARY: 115 mg/dL — AB (ref 65–99)

## 2015-07-03 NOTE — Discharge Instructions (Signed)
Diet: As you were doing prior to hospitalization   Shower:  May shower but keep the wounds dry, use an occlusive plastic wrap, NO SOAKING IN TUB.  If the bandage gets wet, change with a clean dry gauze.  If you have a splint on, leave the splint in place and keep the splint dry with a plastic bag.  Dressing:  You may change your dressing 3-5 days after surgery, unless you have a splint.  If you have a splint, then just leave the splint in place and we will change your bandages during your first follow-up appointment.    If you had hand or foot surgery, we will plan to remove your stitches in about 2 weeks in the office.  For all other surgeries, there are sticky tapes (steri-strips) on your wounds and all the stitches are absorbable.  Leave the steri-strips in place when changing your dressings, they will peel off with time, usually 2-3 weeks.  Activity:  Increase activity slowly as tolerated, but follow the weight bearing instructions below.  The rules on driving is that you can not be taking narcotics while you drive, and you must feel in control of the vehicle.    Weight Bearing:   As tolerated.    To prevent constipation: you may use a stool softener such as -  Colace (over the counter) 100 mg by mouth twice a day  Drink plenty of fluids (prune juice may be helpful) and high fiber foods Miralax (over the counter) for constipation as needed.    Itching:  If you experience itching with your medications, try taking only a single pain pill, or even half a pain pill at a time.  You may take up to 10 pain pills per day, and you can also use benadryl over the counter for itching or also to help with sleep.   Precautions:  If you experience chest pain or shortness of breath - call 911 immediately for transfer to the hospital emergency department!!  If you develop a fever greater that 101 F, purulent drainage from wound, increased redness or drainage from wound, or calf pain -- Call the office at  [phone]                                                Follow- Up Appointment:  Please call for an appointment to be seen in 2 weeks [place] - [phone]     Regional Anesthesia Blocks  1. Numbness or the inability to move the "blocked" extremity may last from 3-48 hours after placement. The length of time depends on the medication injected and your individual response to the medication. If the numbness is not going away after 48 hours, call your surgeon.  2. The extremity that is blocked will need to be protected until the numbness is gone and the  Strength has returned. Because you cannot feel it, you will need to take extra care to avoid injury. Because it may be weak, you may have difficulty moving it or using it. You may not know what position it is in without looking at it while the block is in effect.  3. For blocks in the legs and feet, returning to weight bearing and walking needs to be done carefully. You will need to wait until the numbness is entirely gone and the strength has returned. You should  be able to move your leg and foot normally before you try and bear weight or walk. You will need someone to be with you when you first try to ensure you do not fall and possibly risk injury.  4. Bruising and tenderness at the needle site are common side effects and will resolve in a few days.  5. Persistent numbness or new problems with movement should be communicated to the surgeon or the [facility] [phone] [place] [phone]).   Postoperative Anesthesia Instructions-Pediatric  Activity: Your child should rest for the remainder of the day. A responsible adult should stay with your child for 24 hours.  Meals: Your child should start with liquids and light foods such as gelatin or soup unless otherwise instructed by the physician. Progress to regular foods as tolerated. Avoid spicy, greasy, and heavy foods. If nausea and/or vomiting  occur, drink only clear liquids such as apple juice or Pedialyte until the nausea and/or vomiting subsides. Call your physician if vomiting continues.  Special Instructions/Symptoms: Your child may be drowsy for the rest of the day, although some children experience some hyperactivity a few hours after the surgery. Your child may also experience some irritability or crying episodes due to the operative procedure and/or anesthesia. Your child's throat may feel dry or sore from the anesthesia or the breathing tube placed in the throat during surgery. Use throat lozenges, sprays, or ice chips if needed.

## 2015-07-29 ENCOUNTER — Encounter: Payer: Self-pay | Admitting: Internal Medicine

## 2015-08-18 ENCOUNTER — Ambulatory Visit (INDEPENDENT_AMBULATORY_CARE_PROVIDER_SITE_OTHER): Payer: 59 | Admitting: Internal Medicine

## 2015-08-18 ENCOUNTER — Encounter: Payer: Self-pay | Admitting: Internal Medicine

## 2015-08-18 VITALS — BP 126/74 | HR 80 | Temp 97.9°F | Ht 67.0 in | Wt 240.0 lb

## 2015-08-18 DIAGNOSIS — T8454XA Infection and inflammatory reaction due to internal left knee prosthesis, initial encounter: Secondary | ICD-10-CM

## 2015-08-18 DIAGNOSIS — Z0000 Encounter for general adult medical examination without abnormal findings: Secondary | ICD-10-CM | POA: Insufficient documentation

## 2015-08-18 DIAGNOSIS — Z720 Tobacco use: Secondary | ICD-10-CM

## 2015-08-18 NOTE — Progress Notes (Signed)
   Subjective:    Patient ID: [name], female    DOB: 1956/02/16, 60 y.o.   MRN: [id number]  HPI Here for follow up of PJI.  [name] is a 60 y.o. female with history of left partial knee replacement 3/3 and history of foot infection noted dehiscence of wound. Seen by Dr. Mardelle Matte and noted some purulent smell superficially with no surrounding No fever, no chills. With concern for infection, was taken to the OR for polyexchange. Noted over 800 WBCs but no obvious purulence. Culture remained negative.  Has been on vancomycin and levequin for 6 weeks (finishing tomorrow).  CRP, ESR reassuring and other labs have been fine.  Knee doing well. Getting right knee replaced 5/11.  No fever,  No chills, no diarrhea.   Previous OP report noted with procedure.    Review of Systems  Constitutional: Negative for fatigue.  Gastrointestinal: Negative for diarrhea.  Skin: Negative for rash.  Neurological: Negative for dizziness and light-headedness.       Objective:   Physical Exam  Constitutional: She appears well-developed and well-nourished.  Eyes: No scleral icterus.  Musculoskeletal:  Left knee with well healed scar, no erythema, no warmth or swelling          Assessment & Plan:

## 2015-08-18 NOTE — Assessment & Plan Note (Signed)
Doing great, walking better, very grateful for care.  Can stop oral antibiotic and follow up PRN.

## 2015-08-18 NOTE — Assessment & Plan Note (Signed)
Knows to quit smoking.

## 2015-10-29 ENCOUNTER — Encounter: Payer: Self-pay | Admitting: Podiatry

## 2015-10-29 ENCOUNTER — Ambulatory Visit (INDEPENDENT_AMBULATORY_CARE_PROVIDER_SITE_OTHER): Payer: 59 | Admitting: Podiatry

## 2015-10-29 ENCOUNTER — Ambulatory Visit (INDEPENDENT_AMBULATORY_CARE_PROVIDER_SITE_OTHER): Payer: 59

## 2015-10-29 ENCOUNTER — Ambulatory Visit: Payer: Self-pay

## 2015-10-29 VITALS — BP 131/80 | HR 71 | Resp 16

## 2015-10-29 DIAGNOSIS — M79671 Pain in right foot: Secondary | ICD-10-CM

## 2015-10-29 DIAGNOSIS — M79672 Pain in left foot: Secondary | ICD-10-CM

## 2015-10-29 DIAGNOSIS — Q828 Other specified congenital malformations of skin: Secondary | ICD-10-CM | POA: Diagnosis not present

## 2015-10-29 DIAGNOSIS — L97512 Non-pressure 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: Secondary | ICD-10-CM | POA: Diagnosis not present

## 2015-10-29 DIAGNOSIS — E1149 Type 2 diabetes mellitus with other diabetic neurological complication: Secondary | ICD-10-CM | POA: Diagnosis not present

## 2015-10-29 DIAGNOSIS — E1140 Type 2 diabetes mellitus with diabetic neuropathy, unspecified: Secondary | ICD-10-CM

## 2015-10-29 DIAGNOSIS — L03031 Cellulitis of right toe: Secondary | ICD-10-CM | POA: Diagnosis not present

## 2015-10-29 DIAGNOSIS — L97511 Non-pressure chronic ulcer of other part of right foot limited to breakdown of skin: Secondary | ICD-10-CM | POA: Diagnosis not present

## 2015-10-29 MED ORDER — CIPROFLOXACIN HCL 500 MG PO TABS
500.0000 mg | ORAL_TABLET | Freq: Two times a day (BID) | ORAL | 0 refills | Status: DC
Start: 2015-10-29 — End: 2016-03-28

## 2015-10-30 NOTE — Progress Notes (Signed)
Subjective:     Patient ID: [name], female   DOB: 09-20-1955, 60 y.o.   MRN: [id number]  HPI patient states she's had an amputation of her right second toe and is developed a breakdown of tissue right first metatarsal and a blister on the left hallux with irritation and states that she's on doxycycline but that she tried to wear shoes after being in flip-flops all summer   Review of Systems     Objective:   Physical Exam Neurological significantly reduced with no other change in health history and good control of diabetes at the current time. Moderate obesity was noted with breakdown of tissue right first metatarsal with localized irritation and abscess of the left first metatarsal within the hallux that's localized with no proximal edema erythema or drainage noted    Assessment:     History of amputation with ulceration right first metatarsal left hallux localized in nature with abscess of the left hallux    Plan:     H&P x-rays reviewed and after appropriate numbness and using sterile instrumentation I did drain the abscess on the left first metatarsal flushed and applied sterile dressing along with padding and Iodosorb usage for the right which she will start at home. It is localized and should heal over but strict instructions given of any increased redness drainage or other indications of infection were to occur she is to reappoint immediately. I did add Cipro on as medication to try to help  X-ray report indicates that there is no indications of ostial mild lytic changes at the current time and there has been amputation of the distal joint second digit right foot

## 2015-11-05 ENCOUNTER — Ambulatory Visit (INDEPENDENT_AMBULATORY_CARE_PROVIDER_SITE_OTHER): Payer: 59 | Admitting: Podiatry

## 2015-11-05 ENCOUNTER — Encounter: Payer: Self-pay | Admitting: Podiatry

## 2015-11-05 VITALS — BP 128/68 | HR 75 | Resp 16

## 2015-11-05 DIAGNOSIS — L89891 Pressure ulcer of other site, stage 1: Secondary | ICD-10-CM

## 2015-11-05 DIAGNOSIS — E1149 Type 2 diabetes mellitus with other diabetic neurological complication: Secondary | ICD-10-CM

## 2015-11-05 DIAGNOSIS — E1140 Type 2 diabetes mellitus with diabetic neuropathy, unspecified: Secondary | ICD-10-CM

## 2015-11-05 DIAGNOSIS — M779 Enthesopathy, unspecified: Secondary | ICD-10-CM

## 2015-11-05 DIAGNOSIS — L97511 Non-pressure chronic ulcer of other part of right foot limited to breakdown of skin: Secondary | ICD-10-CM

## 2015-11-12 ENCOUNTER — Other Ambulatory Visit: Payer: Self-pay | Admitting: *Deleted

## 2015-11-12 MED ORDER — CIPROFLOXACIN HCL 500 MG PO TABS: 500.0000 mg | ORAL_TABLET | Freq: Two times a day (BID) | ORAL | 0 refills | Status: DC

## 2015-11-12 NOTE — Telephone Encounter (Addendum)
Pt states she has completed the Cipro and has an appt Dec 02, 2015 with Dr. Paulla Dolly, but toe area is still red with little drainage.  I told pt I would refill Cipro to get her to the appt and see if Dr. Paulla Dolly wanted to refill the Doxycycline as well from another doctor. 11/20/2015-Dr. Regal ordered to begin prior authorization for [employer] PriMatrix graft. 11/24/2015-Meg[facility] - [employer] confirmed the Patient Insurance Benefit Verification Results, that pt had met all deductibles and out-of-pocket expense, so would owe nothing. 11/24/2015-Pt states Dr. Paulla Dolly had wanted her to stay on the antibiotic, needs refill. Informed pt of Dr. Mellody Drown orders. 11/25/2015-DrCannon Kettle reviewed with Dr. Paulla Dolly pt's treatment and recommended [facility] Grafix. Dr. Paulla Dolly discontinued orders for [employer], ordered Grafix. Faxed required [employer] Grafix form using 12-02-2015 ICD.10 codes, pt's clinicals and demographics. 11/27/2015-Informed pt Dr. Paulla Dolly had change the type of graft he wanted and a new pre-cert had to be started. I spoke with Lars Pinks, he states he started the pre-determination yesterday.

## 2015-11-19 ENCOUNTER — Ambulatory Visit (INDEPENDENT_AMBULATORY_CARE_PROVIDER_SITE_OTHER): Payer: 59 | Admitting: Podiatry

## 2015-11-19 ENCOUNTER — Encounter: Payer: Self-pay | Admitting: Podiatry

## 2015-11-19 DIAGNOSIS — L89891 Pressure ulcer of other site, stage 1: Secondary | ICD-10-CM

## 2015-11-19 DIAGNOSIS — E1140 Type 2 diabetes mellitus with diabetic neuropathy, unspecified: Secondary | ICD-10-CM

## 2015-11-19 DIAGNOSIS — L97512 Non-pressure 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: Secondary | ICD-10-CM

## 2015-11-19 DIAGNOSIS — E1149 Type 2 diabetes mellitus with other diabetic neurological complication: Secondary | ICD-10-CM

## 2015-11-20 NOTE — Progress Notes (Signed)
Subjective:     Patient ID: [name], female   DOB: 1955/08/05, 60 y.o.   MRN: [id number]  HPI patient presents stating that she continues to have problems with the right plantar first metatarsal and that the digit does have distal keratotic lesion. She's had a breakdown that's been present for around 6 weeks at this point with history of amputation in the previous past   Review of Systems     Objective:   Physical Exam Neurovascular status unchanged with significant neurological disease and moderate vascular disease right with a approximate 5 x 4 cm plantar ulceration first metatarsal right with subcutaneous exposure but no tendon bone exposure and no proximal erythema noted or edema    Assessment:     Chronic ulceration right which may not heal and will most likely require grafting    Plan:     Clean the tissue at this point flushed it and applied Iodosorb which she will use at home along with sterile dressing and discussed offloading. If we cannot get improvement in the need to consider graft for this particular problem and I also discussed hammertoe but do not recommend current treatment. She does understand she is at risk of infection and ultimate amputation and also at this time we added Cipro to her oral antibiotic regimen

## 2015-11-20 NOTE — Progress Notes (Signed)
Subjective:     Patient ID: [name], female   DOB: 03-09-1955, 60 y.o.   MRN: [id number]  HPI patient continues to have ulceration plantar aspect right first metatarsal that has failed to heal so far with immobilization and localized wound care. It measures approximately 4 cm x 3 cm in length with subcutaneous tissue exposure but no proximal edema erythema drainage   Review of Systems     Objective:   Physical Exam Neurological sensation severely compromised due to long-term diabetes history of ulceration and amputation with sub-first metatarsal breakdown of tissue measuring about 4 cm in length 3 cm in width that does have subcutaneous exposure but no probing to bone or tendon with no proximal edema erythema drainage noted    Assessment:     Chronic ulceration plantar aspect right    Plan:     Dispensed a new wedge shoe to keep pressure off this area and recommended graft that we will get approval for her to heal this area as I do not want her to develop further infection and require further amputation

## 2015-11-24 MED ORDER — DOXYCYCLINE HYCLATE 100 MG PO CAPS: 100.0000 mg | ORAL_CAPSULE | Freq: Two times a day (BID) | ORAL | 0 refills | Status: DC

## 2015-11-24 NOTE — Telephone Encounter (Signed)
I would continue her on the doxycycline and if she should run fever or if redness gets worse she needs to come in

## 2015-12-01 ENCOUNTER — Telehealth: Payer: Self-pay | Admitting: *Deleted

## 2015-12-01 NOTE — Telephone Encounter (Signed)
There is another graft we can do. Lets do the one we received approval for before we tried the grafix

## 2015-12-01 NOTE — Telephone Encounter (Signed)
I am calling from [facility] about the case [id number].  The cpt codes are Q4132, [phone] and  15275.  All codes were denied because it was unproven to be medically necessary. If you have any questions you can call [phone] opt. 5.  Call [phone] for peer to peer.

## 2015-12-01 NOTE — Telephone Encounter (Addendum)
Informed Dr. Earle Gell Grafix was denied, gave information for [employer], case# [id number], call [phone]. 12/02/2015-Meghan - [employer] states pt has met the deductible and there is no cost to pt, PriMatrix is covered 100%. Dr. Paulla Dolly states have pt come in 12/03/2015 for [name] to apply PriMatrix on 12/03/2015. I informed [name] of Dr. Mellody Drown recommendation for PriMatrix to be applied by [name] on 12/03/2015.  [name] agreed. I informed pt of Dr. Mellody Drown recommendation of [name] and that the PriMatrix graft would be covered 100%. Pt agreed and I transferred to schedulers. 12/02/2015-Pt states [place] the [place], would that be okay. I left message informing pt it would be okay to ride to the mountains and walk to a shop for the apples, not long period of walking, definitely not climbing, the pressure would not be good for the graft.

## 2015-12-02 ENCOUNTER — Telehealth: Payer: Self-pay | Admitting: *Deleted

## 2015-12-02 NOTE — Telephone Encounter (Addendum)
EMailed order to [employer] and to [employer] for application of 4cm x 4cm meshed Primatrix [id number] for [id number] for application Monday 0000000. 12/09/2015-Emailed order for PriMatrix to [employer] and [employer] for [id number] for application Monday [id number]. I reviewed pt's current appt schedule and she is scheduled for 12/28/2015, but needs to scheduled for 12/21/2015. Left message informing pt the change to be made when she called or our scheduler -D. Miner called her. 12/28/2015-Emailed order for 4cm x 4cm meshed Primatrix [id number] $1299.00 for [id number] for application [id number] at 2:45pm. 01/18/2016-I spoke with [employer] Thayer Headings asked status of pt's PriMatrix, she stated she did not see an order.  I asked how could there be no order when a 01/08/2016 email was received asking for a hardcopy PO for 01/18/2016 order that our office had not received. Thayer Headings states she will contact [facility] our rep and get a PriMatrix out to our office today. Thayer Headings - [employer] states they will not be able to ship PriMatrix today, but can have it in office for 01/19/2016, I told her that would be good, since [name] won't be in office tomorrow. Confirmation number: [id number], PO [id number]. Left message requesting a call back to reschedule today's appt to 01/20/2016. 02/03/2016-Pt missed 02/03/2016 appt. 02/04/2016-Rescheduled for 02/08/2016.

## 2015-12-07 ENCOUNTER — Ambulatory Visit (INDEPENDENT_AMBULATORY_CARE_PROVIDER_SITE_OTHER): Payer: 59 | Admitting: Podiatry

## 2015-12-07 DIAGNOSIS — E08621 Diabetes mellitus due to underlying condition with foot ulcer: Secondary | ICD-10-CM | POA: Diagnosis not present

## 2015-12-07 DIAGNOSIS — L97512 Non-pressure 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: Secondary | ICD-10-CM | POA: Diagnosis not present

## 2015-12-07 DIAGNOSIS — I70245 Atherosclerosis of native arteries of left leg with ulceration of other part of foot: Secondary | ICD-10-CM

## 2015-12-07 DIAGNOSIS — L97522 Non-pressure chronic ulcer of other part of left foot with fat layer exposed: Secondary | ICD-10-CM

## 2015-12-07 DIAGNOSIS — I70234 Atherosclerosis of native arteries of right leg with ulceration of heel and midfoot: Secondary | ICD-10-CM | POA: Diagnosis not present

## 2015-12-07 DIAGNOSIS — E0843 Diabetes mellitus due to underlying condition with diabetic autonomic (poly)neuropathy: Secondary | ICD-10-CM

## 2015-12-07 NOTE — Progress Notes (Signed)
Subjective:  Patient presents to the office today for evaluation of bilateral ulcerations secondary to diabetes mellitus. Patient states compliance with offloading and dressing changes.    Objective/Physical Exam General: The patient is alert and oriented x3 in no acute distress.  Dermatology: Skin is warm, dry and supple bilateral lower extremities.   Wound #1 is located to the plantar aspect of the first MPJ right foot. Wound measures 3.0 cm in length by 2.5 cm in width by 0.2 cm in depth to total wound measurement area of 7.5 cm.  #2 is located to the left great toe measuring 1.5 cm in length by 1.0 cm in width by 0.2 cm in depth.  To the noted ulcerations there is no eschar there is a moderate amount of slough fibrin and necrotic tissue. Granulation tissue wound base is red. There is no exposed bone muscle-tendon ligament or joint. There is no odor and there is a moderate amount of serous drainage noted. Periwound integrity is callused.  Vascular: Palpable pedal pulses bilaterally. No edema or erythema noted. Capillary refill within normal limits.  Neurological: Epicritic and protective threshold absent bilaterally.   Musculoskeletal Exam: Range of motion within normal limits to all pedal and ankle joints bilateral. Muscle strength 5/5 in all groups bilateral.   Radiographic Exam:  Normal osseous mineralization. Joint spaces preserved. No fracture/dislocation/boney destruction.    Assessment: #1 ulceration sub-first MPJ right foot secondary to diabetes mellitus #2 ulceration left great toe medially secondary to diabetes mellitus #3 diabetes mellitus with manifestations of peripheral neuropathy   Plan of Care:  #1 Patient was evaluated. #2 application of 16 cm of primary matrix dermal repair scaffold by [employer] was applied to the sub-first MPJ right foot ulceration. REF# [phone]. LOT# [id number].  Please note this graft is medically necessary.  All conservative methods of  treatment have failed and the patient has had the wound for greater than 8 weeks. The graft was then fixated with Steri-Strips. Dry sterile dressing was then applied. #3 medically necessary excisional debridement including subcutaneous tissue was performed to the left great toe ulceration. Excisional debridement of all the necrotic nonviable tissue down to healthy bleeding viable tissue was performed with post-debridement measurements and was pre-. #4 the wound was cleansed with normal saline dressed sterile dressing applied. #5 patient instructed to keep the dressings clean dry and intact until she is to follow up at the clinic in 2 weeks. Dr. Edrick Kins, [place]

## 2015-12-21 ENCOUNTER — Ambulatory Visit (INDEPENDENT_AMBULATORY_CARE_PROVIDER_SITE_OTHER): Payer: 59 | Admitting: Podiatry

## 2015-12-21 DIAGNOSIS — L97512 Non-pressure 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: Secondary | ICD-10-CM

## 2015-12-21 DIAGNOSIS — E1140 Type 2 diabetes mellitus with diabetic neuropathy, unspecified: Secondary | ICD-10-CM

## 2015-12-21 DIAGNOSIS — I70245 Atherosclerosis of native arteries of left leg with ulceration of other part of foot: Secondary | ICD-10-CM

## 2015-12-21 DIAGNOSIS — L97522 Non-pressure chronic ulcer of other part of left foot with fat layer exposed: Secondary | ICD-10-CM

## 2015-12-21 DIAGNOSIS — E0843 Diabetes mellitus due to underlying condition with diabetic autonomic (poly)neuropathy: Secondary | ICD-10-CM

## 2015-12-21 DIAGNOSIS — E1149 Type 2 diabetes mellitus with other diabetic neurological complication: Secondary | ICD-10-CM

## 2015-12-21 DIAGNOSIS — E08621 Diabetes mellitus due to underlying condition with foot ulcer: Secondary | ICD-10-CM

## 2015-12-27 NOTE — Progress Notes (Signed)
Subjective:  Patient presents to the office today for evaluation of bilateral ulcerations secondary to diabetes mellitus. Patient states compliance with offloading and dressing changes.    Objective/Physical Exam General: The patient is alert and oriented x3 in no acute distress.  Dermatology: Skin is warm, dry and supple bilateral lower extremities.   Wound #1 is located to the plantar aspect of the first MPJ right foot. Wound measures 3.0 cm in length by 2.5 cm in width by 0.2 cm in depth to total wound measurement area of 7.5 cm.  #2 is located to the left great toe measuring 1.5 cm in length by 1.0 cm in width by 0.2 cm in depth.  To the noted ulcerations there is no eschar there is a moderate amount of slough fibrin and necrotic tissue. Granulation tissue wound base is red. There is no exposed bone muscle-tendon ligament or joint. There is no odor and there is a moderate amount of serous drainage noted. Periwound integrity is callused.  Vascular: Palpable pedal pulses bilaterally. No edema or erythema noted. Capillary refill within normal limits.  Neurological: Epicritic and protective threshold absent bilaterally.   Musculoskeletal Exam: Range of motion within normal limits to all pedal and ankle joints bilateral. Muscle strength 5/5 in all groups bilateral.   Radiographic Exam:  Normal osseous mineralization. Joint spaces preserved. No fracture/dislocation/boney destruction.    Assessment: #1 ulceration sub-first MPJ right foot secondary to diabetes mellitus #2 ulceration left great toe medially secondary to diabetes mellitus #3 diabetes mellitus with manifestations of peripheral neuropathy   Plan of Care:  #1 Patient was evaluated. #2 application of 16 cm of primary matrix dermal repair scaffold by [employer] was applied to the sub-first MPJ right foot ulceration. REF# [phone]. LOT# [id number].  Please note this graft is medically necessary.  All conservative methods of  treatment have failed and the patient has had the wound for greater than 8 weeks. The graft was then fixated with Steri-Strips. Dry sterile dressing was then applied. #3 medically necessary excisional debridement including subcutaneous tissue was performed to the left great toe ulceration. Excisional debridement of all the necrotic nonviable tissue down to healthy bleeding viable tissue was performed with post-debridement measurements and was pre-. #4 the wound was cleansed with normal saline dressed sterile dressing applied. #5 patient instructed to keep the dressings clean dry and intact until she is to follow up at the clinic in 2 weeks. Dr. Edrick Kins, [place]

## 2015-12-28 ENCOUNTER — Ambulatory Visit: Payer: 59 | Admitting: Podiatry

## 2016-01-04 ENCOUNTER — Ambulatory Visit (INDEPENDENT_AMBULATORY_CARE_PROVIDER_SITE_OTHER): Payer: 59 | Admitting: Podiatry

## 2016-01-04 DIAGNOSIS — I70234 Atherosclerosis of native arteries of right leg with ulceration of heel and midfoot: Secondary | ICD-10-CM | POA: Diagnosis not present

## 2016-01-04 DIAGNOSIS — E0843 Diabetes mellitus due to underlying condition with diabetic autonomic (poly)neuropathy: Secondary | ICD-10-CM

## 2016-01-04 DIAGNOSIS — E08621 Diabetes mellitus due to underlying condition with foot ulcer: Secondary | ICD-10-CM | POA: Diagnosis not present

## 2016-01-04 DIAGNOSIS — L97512 Non-pressure 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: Secondary | ICD-10-CM | POA: Diagnosis not present

## 2016-01-04 DIAGNOSIS — I70245 Atherosclerosis of native arteries of left leg with ulceration of other part of foot: Secondary | ICD-10-CM

## 2016-01-04 DIAGNOSIS — L97522 Non-pressure chronic ulcer of other part of left foot with fat layer exposed: Secondary | ICD-10-CM

## 2016-01-17 NOTE — Progress Notes (Signed)
Subjective:  Patient presents to the office today for evaluation of bilateral ulcerations secondary to diabetes mellitus. Patient states compliance with offloading and dressing changes.    Objective/Physical Exam General: The patient is alert and oriented x3 in no acute distress.  Dermatology: Skin is warm, dry and supple bilateral lower extremities.   Wound #1 is located to the plantar aspect of the first MPJ right foot. Wound measures 3.0 cm in length by 2.5 cm in width by 0.2 cm in depth to total wound measurement area of 7.5 cm.  #2 is located to the left great toe measuring 1.5 cm in length by 1.0 cm in width by 0.2 cm in depth.  To the noted ulcerations there is no eschar there is a moderate amount of slough fibrin and necrotic tissue. Granulation tissue wound base is red. There is no exposed bone muscle-tendon ligament or joint. There is no odor and there is a moderate amount of serous drainage noted. Periwound integrity is callused.  Vascular: Palpable pedal pulses bilaterally. No edema or erythema noted. Capillary refill within normal limits.  Neurological: Epicritic and protective threshold absent bilaterally.   Musculoskeletal Exam: Range of motion within normal limits to all pedal and ankle joints bilateral. Muscle strength 5/5 in all groups bilateral.   Radiographic Exam:  Normal osseous mineralization. Joint spaces preserved. No fracture/dislocation/boney destruction.    Assessment: #1 ulceration sub-first MPJ right foot secondary to diabetes mellitus #2 ulceration left great toe medially secondary to diabetes mellitus #3 diabetes mellitus with manifestations of peripheral neuropathy   Plan of Care:  #1 Patient was evaluated. #2 application of 16 cm of primary matrix dermal repair scaffold by [employer] was applied to the sub-first MPJ right foot ulceration to total wound measurement of 1.5 cm. REF# [phone]. LOT# [id number].  Please note this graft is medically  necessary.  All conservative methods of treatment have failed and the patient has had the wound for greater than 8 weeks. The graft was then fixated with Steri-Strips. Dry sterile dressing was then applied. #3 medically necessary excisional debridement including subcutaneous tissue was performed to the left great toe ulceration. Excisional debridement of all the necrotic nonviable tissue down to healthy bleeding viable tissue was performed with post-debridement measurements and was pre-. #4 the wound was cleansed with normal saline dressed sterile dressing applied. #5 patient instructed to keep the dressings clean dry and intact until she is to follow up at the clinic in 2 weeks. Dr. Edrick Kins, [place]

## 2016-01-18 ENCOUNTER — Ambulatory Visit: Payer: 59 | Admitting: Podiatry

## 2016-01-20 ENCOUNTER — Ambulatory Visit (INDEPENDENT_AMBULATORY_CARE_PROVIDER_SITE_OTHER): Payer: 59 | Admitting: Podiatry

## 2016-01-20 ENCOUNTER — Ambulatory Visit (INDEPENDENT_AMBULATORY_CARE_PROVIDER_SITE_OTHER): Payer: 59

## 2016-01-20 DIAGNOSIS — E11621 Type 2 diabetes mellitus with foot ulcer: Secondary | ICD-10-CM | POA: Diagnosis not present

## 2016-01-20 DIAGNOSIS — M79672 Pain in left foot: Secondary | ICD-10-CM

## 2016-01-20 DIAGNOSIS — M109 Gout, unspecified: Secondary | ICD-10-CM | POA: Diagnosis not present

## 2016-01-20 DIAGNOSIS — I70245 Atherosclerosis of native arteries of left leg with ulceration of other part of foot: Secondary | ICD-10-CM

## 2016-01-20 DIAGNOSIS — M25472 Effusion, left ankle: Secondary | ICD-10-CM

## 2016-01-20 DIAGNOSIS — M25572 Pain in left ankle and joints of left foot: Secondary | ICD-10-CM

## 2016-01-20 DIAGNOSIS — E0843 Diabetes mellitus due to underlying condition with diabetic autonomic (poly)neuropathy: Secondary | ICD-10-CM

## 2016-01-20 DIAGNOSIS — L97502 Non-pressure chronic ulcer of other part of unspecified foot with fat layer exposed: Secondary | ICD-10-CM

## 2016-01-20 DIAGNOSIS — E08621 Diabetes mellitus due to underlying condition with foot ulcer: Secondary | ICD-10-CM

## 2016-01-20 DIAGNOSIS — L97512 Non-pressure 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: Secondary | ICD-10-CM

## 2016-01-20 MED ORDER — BETAMETHASONE SOD PHOS & ACET 6 (3-3) MG/ML IJ SUSP
3.0000 mg | Freq: Once | INTRAMUSCULAR | Status: DC
Start: 2016-01-20 — End: 2016-10-08

## 2016-01-20 NOTE — Progress Notes (Signed)
Subjective:  Patient presents to the office today for evaluation of an ulcer to the weightbearing surfaces of the first MPJ right foot secondary to diabetes mellitus. Patient states compliance with offloading and dressing changes. There is also a new finding to an ulceration to the lateral aspect of the right foot overlying the fifth MPJ. Patient was not aware of the ulceration until she presented the clinic today. Patient also has new complaint of possible gout attack to the left ankle joint. Patient experienced tightness with pain to the left ankle joint approximately one week ago. Patient contacted her primary care doctor over the phone and the primary care doctor was suspicious for possible gout. Patient denies trauma.    Objective/Physical Exam General: The patient is alert and oriented x3 in no acute distress.  Dermatology: Skin is warm, dry and supple bilateral lower extremities.   Wound #1 is located to the plantar aspect of the first MPJ right foot. Wound measures 0.5 cm in length by 0.5 cm in width by 0.1 cm in depth to total wound measurement area of 0.25 cm.  #2 is located to the left great toe has healed. Complete reepithelialization has occurred. No indications of infectious process.  Wound #3 is located to the lateral aspect of the left foot overlying the fifth MPJ. Wound measures 1.0 cm in length 0.7 cm in width 0.3 cm in depth.  To the noted ulcerations there is no eschar there is a moderate amount of slough fibrin and necrotic tissue. Granulation tissue wound base is red. There is no exposed bone muscle-tendon ligament or joint. There is no odor and there is a moderate amount of serous drainage noted. Periwound integrity is callused.  Vascular: Palpable pedal pulses bilaterally. No edema or erythema noted. Capillary refill within normal limits.  Neurological: Epicritic and protective threshold absent bilaterally.   Musculoskeletal Exam: Range of motion within normal limits  to all pedal and ankle joints bilateral. Muscle strength 5/5 in all groups bilateral.   Radiographic Exam:  Normal osseous mineralization. Joint spaces preserved. No fracture/dislocation/boney destruction.    Assessment: #1 ulceration sub-first MPJ right foot secondary to diabetes mellitus #2 ulceration lateral aspect of the fifth MPJ secondary to diabetes mellitus #3 diabetes mellitus with manifestations of peripheral neuropathy #4 edema left ankle #5 possible acute gout attack left ankle joint #6 pain in left ankle   Plan of Care:  #1 Patient was evaluated. #2 application of 16 cm of primary matrix dermal repair scaffold by [employer] was applied to the sub-first MPJ right foot ulceration to total wound measurement of 0.25 cm. REF# [phone]. LOT# [id number].  Please note this graft is medically necessary.  All conservative methods of treatment have failed and the patient has had the wound for greater than 8 weeks. The graft was then fixated with Steri-Strips. Dry sterile dressing was then applied. #3 medically necessary excisional debridement including subcutaneous tissue was performed to the left fifth MPJ ulceration. Excisional debridement of all the necrotic nonviable tissue down to healthy bleeding viable tissue was performed with post-debridement measurements and was pre-. #4 the wound was cleansed with normal saline dressed sterile dressing applied. #5 patient instructed to keep the dressings clean dry and intact until she is to follow up at the clinic in 2 weeks. #6 injection of 0.5 mL Celestone Soluspan injected in the patient's left ankle joint. #7 orders for uric acid levels were placed #8 compression anklet dispensed  Dr. Edrick Kins, [place]

## 2016-01-29 LAB — URIC ACID: URIC ACID, SERUM: 6.6 mg/dL (ref 2.5–7.0)

## 2016-02-03 ENCOUNTER — Ambulatory Visit: Payer: 59 | Admitting: Podiatry

## 2016-02-08 ENCOUNTER — Ambulatory Visit (INDEPENDENT_AMBULATORY_CARE_PROVIDER_SITE_OTHER): Payer: 59 | Admitting: Podiatry

## 2016-02-08 DIAGNOSIS — I70234 Atherosclerosis of native arteries of right leg with ulceration of heel and midfoot: Secondary | ICD-10-CM | POA: Diagnosis not present

## 2016-02-08 DIAGNOSIS — E08621 Diabetes mellitus due to underlying condition with foot ulcer: Secondary | ICD-10-CM

## 2016-02-08 DIAGNOSIS — L97512 Non-pressure 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: Secondary | ICD-10-CM

## 2016-02-08 DIAGNOSIS — E0843 Diabetes mellitus due to underlying condition with diabetic autonomic (poly)neuropathy: Secondary | ICD-10-CM

## 2016-02-08 DIAGNOSIS — I70245 Atherosclerosis of native arteries of left leg with ulceration of other part of foot: Secondary | ICD-10-CM

## 2016-02-08 NOTE — Progress Notes (Signed)
Subjective:  Patient presents to the office today for evaluation of an ulcer to the weightbearing surfaces of the first MPJ right foot secondary to diabetes mellitus. Patient states compliance with offloading and dressing changes. There is also a new finding to an ulceration to the lateral aspect of the right foot overlying the fifth MPJ. Patient was not aware of the ulceration until she presented the clinic today. Patient also has new complaint of possible gout attack to the left ankle joint. Patient experienced tightness with pain to the left ankle joint approximately one week ago. Patient contacted her primary care doctor over the phone and the primary care doctor was suspicious for possible gout. Patient denies trauma.    Objective/Physical Exam General: The patient is alert and oriented x3 in no acute distress.  Dermatology: Skin is warm, dry and supple bilateral lower extremities.   Wound #1 is located to the plantar aspect of the first MPJ right foot. Wound measures 0.1 cm in length by 0.1 in cm in width by 0.1 cm in depth to total wound measurement area of 0.01 cm.  Wound #2 is located to the left great toe has healed. Complete reepithelialization has occurred. No indications of infectious process.  Wound #3 is located to the lateral aspect of the left foot overlying the fifth MPJ. Wound measures 1.0 cm in length 0.7 cm in width 0.3 cm in depth.  To the noted ulcerations there is no eschar there is a moderate amount of slough fibrin and necrotic tissue. Granulation tissue wound base is red. There is no exposed bone muscle-tendon ligament or joint. There is no odor and there is a moderate amount of serous drainage noted. Periwound integrity is callused.  Vascular: Palpable pedal pulses bilaterally. No edema or erythema noted. Capillary refill within normal limits.  Neurological: Epicritic and protective threshold absent bilaterally.   Musculoskeletal Exam: Range of motion within  normal limits to all pedal and ankle joints bilateral. Muscle strength 5/5 in all groups bilateral.   Radiographic Exam:  Normal osseous mineralization. Joint spaces preserved. No fracture/dislocation/boney destruction.    Assessment: #1 ulceration sub-first MPJ right foot secondary to diabetes mellitus #2 ulceration lateral aspect of the fifth MPJ secondary to diabetes mellitus #3 diabetes mellitus with manifestations of peripheral neuropathy #4 edema left ankle #5 possible acute gout attack left ankle joint #6 pain in left ankle   Plan of Care:  #1 Patient was evaluated. #2 application of 16 cm of primary matrix dermal repair scaffold by [employer] was applied to the sub-first MPJ right foot ulceration to total wound measurement of 0.01 cm. REF# [phone]. LOT# [id number].  Please note this graft is medically necessary.  All conservative methods of treatment have failed and the patient has had the wound for greater than 8 weeks. The graft was then fixated with Steri-Strips. Dry sterile dressing was then applied. #3 medically necessary excisional debridement including subcutaneous tissue was performed to the left fifth MPJ ulceration. Excisional debridement of all the necrotic nonviable tissue down to healthy bleeding viable tissue was performed with post-debridement measurements and was pre-. #4 the wound was cleansed with normal saline dressed sterile dressing applied. #5 patient instructed to keep the dressings clean dry and intact until she is to follow up at the clinic in 2 weeks.   Dr. Edrick Kins, [place]

## 2016-02-22 HISTORY — PX: OTHER SURGICAL HISTORY: SHX169

## 2016-02-24 ENCOUNTER — Ambulatory Visit (INDEPENDENT_AMBULATORY_CARE_PROVIDER_SITE_OTHER): Payer: 59 | Admitting: Podiatry

## 2016-02-24 DIAGNOSIS — E1143 Type 2 diabetes mellitus with diabetic autonomic (poly)neuropathy: Secondary | ICD-10-CM

## 2016-02-28 NOTE — Progress Notes (Signed)
Subjective:  Patient presents today for follow-up evaluation of an ulceration to the right foot secondary to diabetes mellitus. She states that she is greatly improved and she hopes that she is done with the graft applications.    Objective/Physical Exam General: The patient is alert and oriented x3 in no acute distress.  Dermatology: Complete reepithelialization of the ulceration site has occurred. Wound has healed. No sign of erythema or infectious process. Skin is warm, dry and supple bilateral lower extremities. Negative for open lesions or macerations.  Vascular: Palpable pedal pulses bilaterally. No edema or erythema noted. Capillary refill within normal limits.  Neurological: Epicritic and protective threshold grossly intact bilaterally.   Musculoskeletal Exam: Range of motion within normal limits to all pedal and ankle joints bilateral. Muscle strength 5/5 in all groups bilateral.   Assessment: #1 ulceration sub-first MPJ right foot secondary diabetes mellitus-healed #2 ulceration lateral aspect of the fifth MPJ secondary to diabetes mellitus-healed #3 diabetes mellitus with manifestations of peripheral neuropathy   Plan of Care:  #1 Patient was evaluated. #2 prescription for diabetic shoes with Plastizote inserts provided 3 return to clinic when necessary   Edrick Kins, DPM [facility]  Dr. Edrick Kins, [address]                                        [place],  [address]                Office [phone]  Fax [phone]

## 2016-03-14 ENCOUNTER — Ambulatory Visit (INDEPENDENT_AMBULATORY_CARE_PROVIDER_SITE_OTHER): Payer: 59 | Admitting: Podiatry

## 2016-03-14 ENCOUNTER — Ambulatory Visit (INDEPENDENT_AMBULATORY_CARE_PROVIDER_SITE_OTHER): Payer: 59

## 2016-03-14 DIAGNOSIS — L02611 Cutaneous abscess of right foot: Secondary | ICD-10-CM

## 2016-03-14 DIAGNOSIS — L97511 Non-pressure chronic ulcer of other part of right foot limited to breakdown of skin: Secondary | ICD-10-CM

## 2016-03-14 DIAGNOSIS — E1143 Type 2 diabetes mellitus with diabetic autonomic (poly)neuropathy: Secondary | ICD-10-CM

## 2016-03-14 DIAGNOSIS — L03031 Cellulitis of right toe: Secondary | ICD-10-CM

## 2016-03-14 DIAGNOSIS — M869 Osteomyelitis, unspecified: Secondary | ICD-10-CM

## 2016-03-14 MED ORDER — SULFAMETHOXAZOLE-TRIMETHOPRIM 800-160 MG PO TABS: 1.0000 | ORAL_TABLET | Freq: Two times a day (BID) | ORAL | 0 refills | Status: DC

## 2016-03-16 ENCOUNTER — Encounter: Payer: Self-pay | Admitting: Podiatry

## 2016-03-16 DIAGNOSIS — M86679 Other chronic osteomyelitis, unspecified ankle and foot: Secondary | ICD-10-CM | POA: Diagnosis not present

## 2016-03-16 DIAGNOSIS — L02611 Cutaneous abscess of right foot: Secondary | ICD-10-CM | POA: Diagnosis not present

## 2016-03-16 NOTE — Progress Notes (Signed)
   Subjective:  Patient presents today with a new complaint of an ulceration to the third digit of the right foot. Patient states that on Friday, 03/11/2016 her third digit got stepped on and became very swollen. Patient presents today for further treatment and evaluation Patient also has a history of multiple diabetic foot ulcerations which been treated and managed here with healing.  Objective/Physical Exam General: The patient is alert and oriented x3 in no acute distress.  Dermatology:  Wound #1 noted to the third digit right foot measuring 001.001.001.001 cm (LxWxD).   To the noted ulceration(s), there is no eschar. There is a moderate amount of slough, fibrin, and necrotic tissue noted. Granulation tissue and wound base is red. There is a minimal amount of serosanguineous drainage noted. There is exposed bone noted within the ulceration site. There is also exposed muscle-tendon ligament. There is a mild malodor. Periwound integrity is very erythematous with significant edema of the respective digit. Skin is warm, dry and supple bilateral lower extremities.  Vascular: Palpable pedal pulses bilaterally.   Neurological: Epicritic and protective threshold absent bilaterally.   Musculoskeletal Exam: Range of motion within normal limits to all pedal and ankle joints bilateral. Muscle strength 5/5 in all groups bilateral.   Radiographic exam: There is an osteolytic destruction and periosteal reaction to the distal aspect of the distal phalanx third digit right foot consistent with osteomyelitis.  Assessment: #1 ulceration third digit right foot secondary to diabetes mellitus #2 diabetes mellitus w/ peripheral neuropathy   Plan of Care:  #1 Patient was evaluated. #2 medically necessary excisional debridement including muscle and deep fascial tissue was performed using a tissue nipper and a chisel blade. Excisional debridement of all the necrotic nonviable tissue down to healthy bleeding viable  tissue was performed with post-debridement measurements same as pre-. #3 the wound was cleansed and dry sterile dressing applied. #4 due to the severity of the cellulitic toe, today we had a discussion for a toe amputation of the third digit right foot. Patient consents. Patient understands that if she does not go ahead with the toe amputation she is at risk for or proximal infection and more proximal amputation. All benefits and possible complications of procedure were explained in detail. No guarantees were expressed or implied. Authorization for surgery was initiated today. #5 today cultures were taken. Patient was placed on Bactrim Ds. #6 surgery will consist of third digit amputation right foot with incision and drainage of right foot. #7 return to clinic 1 week postop   Edrick Kins, DPM [facility]  Dr. Edrick Kins, [address]                                        [place], [place] [address]                Office [phone]  Fax [phone]

## 2016-03-17 ENCOUNTER — Encounter: Payer: Self-pay | Admitting: Podiatry

## 2016-03-18 ENCOUNTER — Telehealth: Payer: Self-pay

## 2016-03-18 LAB — WOUND CULTURE
Gram Stain: NONE SEEN
Gram Stain: NONE SEEN
Gram Stain: NONE SEEN

## 2016-03-18 NOTE — Telephone Encounter (Signed)
Spoke with pt regarding post opstatus. She was concerned about dried blood on the bottom of her bandage. Advised her this was normal, any bright red or fresh blood would be abnormal and she would need to call us for further instructions

## 2016-03-19 ENCOUNTER — Telehealth: Payer: Self-pay | Admitting: Sports Medicine

## 2016-03-19 NOTE — Telephone Encounter (Signed)
Patient called stating that she has no pain but there is blood on her outer dressing. States that it is the size of a baseball. Admits that she did do a lot of walking and standing today. I encouraged patient to keep dressing intact and to reinforce with 4x4 guaze and paper tape or ACE wrap, to rest, to ice, and to elevate her foot. Patient to check dressing over the next few hours to make sure it does not continue to bleed through. I advised patient to call back if there are issues or if bleeding worsens. If so then will meet patient for dressing changes if does not improve with what I have recommended. Patient expressed understanding. -Dr. Cannon Kettle

## 2016-03-21 ENCOUNTER — Encounter: Payer: Self-pay | Admitting: Podiatry

## 2016-03-21 NOTE — Progress Notes (Signed)
DOS 01.25.2018 Amputation third digit right foot. Incision and drainage right foot.

## 2016-03-22 ENCOUNTER — Ambulatory Visit (INDEPENDENT_AMBULATORY_CARE_PROVIDER_SITE_OTHER): Payer: Self-pay | Admitting: Sports Medicine

## 2016-03-22 VITALS — Temp 96.4°F

## 2016-03-22 DIAGNOSIS — L03031 Cellulitis of right toe: Secondary | ICD-10-CM

## 2016-03-22 DIAGNOSIS — Z9889 Other specified postprocedural states: Secondary | ICD-10-CM

## 2016-03-22 DIAGNOSIS — M869 Osteomyelitis, unspecified: Secondary | ICD-10-CM

## 2016-03-22 DIAGNOSIS — L02611 Cutaneous abscess of right foot: Secondary | ICD-10-CM

## 2016-03-22 DIAGNOSIS — E1143 Type 2 diabetes mellitus with diabetic autonomic (poly)neuropathy: Secondary | ICD-10-CM

## 2016-03-22 NOTE — Progress Notes (Signed)
Subjective: [name] is a 61 y.o. female patient seen today in office for POV #1 (DOS 03-17-16), S/P 3rd toe amputation with [name]. Patient denies pain at surgical site, Admits to neuropathy history so she does not feel much, denies calf pain, denies headache, chest pain, shortness of breath, nausea, vomiting, fever, or chills. Patient states that her dressing came off on yesterday and that she re-inforced and is here for dressing change. Patient is on Cipro. No other issues noted.   Patient Active Problem List   Diagnosis Date Noted  . Primary localized osteoarthritis of right knee 07/02/2015  . S/P right unicompartmental knee replacement 07/02/2015  . Infection of prosthetic left knee joint ([place]) 05/13/2015  . Primary localized osteoarthritis of left knee 04/24/2015  . S/P left unicompartmental knee replacement 04/24/2015  . Hyperlipidemia 04/03/2015  . Pelvic mass in female 02/17/2015  . Mass, ovarian 02/09/2015  . CAD (coronary artery disease) 11/05/2012  . Obesity 11/05/2012  . Tobacco abuse 11/05/2012  . OSA on CPAP 11/05/2012  . HTN (hypertension) 11/05/2012  . DM (diabetes mellitus) type 2, uncontrolled, with ketoacidosis ([place]) 11/05/2012    Current Outpatient Prescriptions on File Prior to Visit  Medication Sig Dispense Refill  . ACCU-CHEK AVIVA PLUS test strip     . ALPRAZolam (XANAX) 0.5 MG tablet Take 1 tablet by mouth 3 (three) times daily as needed.    Marland Kitchen atorvastatin (LIPITOR) 20 MG tablet Take 20 mg by mouth daily.    Marland Kitchen buPROPion (WELLBUTRIN SR) 200 MG 12 hr tablet Take 200 mg by mouth daily.     . ciprofloxacin (CIPRO) 500 MG tablet Take 1 tablet (500 mg total) by mouth 2 (two) times daily. 20 tablet 0  . ciprofloxacin (CIPRO) 500 MG tablet Take 1 tablet (500 mg total) by mouth 2 (two) times daily. 14 tablet 0  . diclofenac sodium (VOLTAREN) 1 % GEL Apply 1 application topically 4 (four) times daily as needed.     . doxycycline (VIBRA-TABS) 100 MG tablet      . doxycycline (VIBRAMYCIN) 100 MG capsule Take 1 capsule (100 mg total) by mouth 2 (two) times daily. 20 capsule 0  . escitalopram (LEXAPRO) 20 MG tablet Take 40 mg by mouth daily.    Marland Kitchen gabapentin (NEURONTIN) 300 MG capsule Take 300 mg by mouth 3 (three) times daily.    Marland Kitchen HUMALOG 100 UNIT/ML injection Pt uses in her Insulin Pump    . Insulin Human (INSULIN PUMP) SOLN Inject into the skin continuous. Humalog - basal rate 2.75/hr    . levothyroxine (SYNTHROID, LEVOTHROID) 112 MCG tablet Take 112 mcg by mouth daily before breakfast.    . sulfamethoxazole-trimethoprim (BACTRIM DS,SEPTRA DS) 800-160 MG tablet Take 1 tablet by mouth 2 (two) times daily. 28 tablet 0  . valsartan-hydrochlorothiazide (DIOVAN-HCT) 80-12.5 MG per tablet Take 1 tablet by mouth daily.    . Vitamin D, Ergocalciferol, (DRISDOL) 50000 units CAPS capsule      Current Facility-Administered Medications on File Prior to Visit  Medication Dose Route Frequency Provider Last Rate Last Dose  . betamethasone acetate-betamethasone sodium phosphate (CELESTONE) injection 3 mg  3 mg Intramuscular Once Edrick Kins, DPM        No Known Allergies  Objective: There were no vitals filed for this visit.  General: No acute distress, AAOx3  Right/Left foot: Sutures intact with mild central capping gapping at surgical site, mild swelling to right foot, no erythema, no warmth, no active drainage, no signs of infection noted,  Capillary fill time <3 seconds in all remaining digits,  No pain or crepitation with range of motion right foot. S/p amputation. No pain with calf compression.   Assessment and Plan:  Problem List Items Addressed This Visit    None    Visit Diagnoses    S/P foot surgery, right    -  Primary   Osteomyelitis of toe of right foot (HCC)       Cellulitis and abscess of toe of right foot       Type 2 diabetes mellitus with autonomic neuropathy, unspecified long term insulin use status ([place])           -Patient seen  and evaluated -Applied steristrips and dry sterile dressing to surgical site right foot secured with ACE wrap and stockinet  -Advised patient to make sure to keep dressings clean, dry, and intact to right surgical site -Advised patient to continue with post-op shoe on right foot -Advised patient to limit activity to necessity to prevent swelling and increased pressure to surgical site that could lead to dehiscence  -Advised patient to elevate to assist with edema control  -Patient to follow up with [name] as scheduled for xrays and to discuss intra-op findings and pathology results. In the meantime, patient to call office if any issues or problems arise.   Landis Martins, DPM

## 2016-03-23 ENCOUNTER — Other Ambulatory Visit: Payer: 59

## 2016-03-28 ENCOUNTER — Ambulatory Visit (INDEPENDENT_AMBULATORY_CARE_PROVIDER_SITE_OTHER): Payer: Self-pay | Admitting: Podiatry

## 2016-03-28 VITALS — Temp 97.2°F

## 2016-03-28 DIAGNOSIS — Z89421 Acquired absence of other right toe(s): Secondary | ICD-10-CM

## 2016-03-28 MED ORDER — GENTAMICIN SULFATE 0.1 % EX CREA: 1.0000 "application " | TOPICAL_CREAM | Freq: Three times a day (TID) | CUTANEOUS | 1 refills | Status: DC

## 2016-03-28 MED ORDER — CIPROFLOXACIN HCL 500 MG PO TABS
500.0000 mg | ORAL_TABLET | Freq: Two times a day (BID) | ORAL | 0 refills | Status: DC
Start: 2016-03-28 — End: 2016-05-09

## 2016-03-30 ENCOUNTER — Encounter: Payer: 59 | Admitting: Podiatry

## 2016-03-31 ENCOUNTER — Telehealth: Payer: Self-pay | Admitting: *Deleted

## 2016-03-31 NOTE — Telephone Encounter (Addendum)
Pt states she picked up cream today and wanted to know if she had to wash the foot before putting it on 3 times a day. I told pt that the cream would be mostly absorbed and could put the medication over the other unless it was gooped up then she could rinse area with saline. Pt states understanding. Pt states the toe doesn't look worse today, but she is very concerned. I told pt I would inform [name] and see if he could give me more upbeat information on her toe.05/27/2016-Pt states she is waiting to hear about being fitted for her diabetic shoes.06/02/2016-Pt called for the status of the diabetic shoes.

## 2016-04-03 NOTE — Progress Notes (Signed)
   Subjective: Patient with a history of diabetes mellitus and neuropathy presents today status post third digit amputation right foot. Patient states that she's doing well. Patient is performing dressing changes at home.  Objective: There does appear to be some cellulitis localized around the amputation stump site. Moderate drainage noted. Negative for malodor. Sutures are intact.  Assessment: Status post third digit amputation right foot. Date of surgery 03/17/2016.  Plan of Care: Today the patient was evaluated. Prescription for ciprofloxacin and gentamicin cream based on culture results. Continue daily dressing changes at home. Return to clinic in 2 weeks  Edrick Kins, DPM [facility]  Dr. Edrick Kins, [address] [place] [address]                                        [place], [place] [address]                Office [phone]  Fax [phone]

## 2016-04-11 ENCOUNTER — Ambulatory Visit (INDEPENDENT_AMBULATORY_CARE_PROVIDER_SITE_OTHER): Payer: 59 | Admitting: Podiatry

## 2016-04-11 DIAGNOSIS — Z89421 Acquired absence of other right toe(s): Secondary | ICD-10-CM

## 2016-04-18 NOTE — Progress Notes (Addendum)
   Subjective: Patient with a history of diabetes mellitus and neuropathy presents today status post third digit amputation right foot. Patient states that she's doing well. Patient is performing dressing changes at home.  Objective: Skin incision sites are well coapted with sutures intact. Negative for sign of any cellulitis. Negative for malodor. Sutures are intact.  Assessment: Status post third digit amputation right foot. Date of surgery 03/17/2016.  Plan of Care: Today the patient was evaluated. Patient has completed her antibiotics and his applying gentamicin cream. Today sutures were removed. Return to clinic in 2-3 weeks. Today authorization for diabetic shoes was initiated.  Edrick Kins, DPM [facility]  Dr. Edrick Kins, [address]                                        [place], [place] [address]                Office [phone]  Fax [phone]

## 2016-04-19 ENCOUNTER — Telehealth: Payer: Self-pay | Admitting: Podiatry

## 2016-04-19 NOTE — Telephone Encounter (Signed)
Pt. Called to see if you were able to get in touch with Dr. Reynaldo Minium about her diabetic shoes.Marland Kitchen

## 2016-04-23 NOTE — Telephone Encounter (Signed)
Please provide patient with update regarding her DM shoes.  Thanks, [name]

## 2016-04-25 ENCOUNTER — Ambulatory Visit (INDEPENDENT_AMBULATORY_CARE_PROVIDER_SITE_OTHER): Payer: Self-pay | Admitting: Podiatry

## 2016-04-25 DIAGNOSIS — Z89421 Acquired absence of other right toe(s): Secondary | ICD-10-CM

## 2016-04-25 NOTE — Progress Notes (Signed)
   Subjective: Patient with a history of diabetes mellitus and neuropathy presents today status post third digit amputation right foot. Patient states that she's doing well. Patient is performing dressing changes at home.  Objective: Skin incision sites are well coapted. There is some moderate edema noted to the right foot. No sign of infectious process however.  Assessment: Status post third digit amputation right foot. Date of surgery 03/17/2016.  Plan of Care: Today the patient was evaluated. Amputation site is completely healed. Complete repair. The medialization has occurred. Compression anklet was dispensed for the patient. Return to clinic in 3 months for routine foot exam  Edrick Kins, DPM [facility]  Dr. Edrick Kins, [address]                                        [place], [place] [address]                Office [phone]  Fax [phone]

## 2016-04-26 ENCOUNTER — Other Ambulatory Visit: Payer: Self-pay | Admitting: Gastroenterology

## 2016-05-10 ENCOUNTER — Encounter (HOSPITAL_COMMUNITY): Payer: Self-pay | Admitting: *Deleted

## 2016-05-11 ENCOUNTER — Encounter (HOSPITAL_COMMUNITY): Payer: Self-pay | Admitting: *Deleted

## 2016-05-11 NOTE — Anesthesia Preprocedure Evaluation (Addendum)
Anesthesia Evaluation  Patient identified by MRN, date of birth, ID band Patient awake    Reviewed: Allergy & Precautions, NPO status , Patient's Chart, lab work & pertinent test results  Airway Mallampati: II  TM Distance: >3 FB Neck ROM: Full    Dental no notable dental hx. (+) Teeth Intact   Pulmonary sleep apnea , Current Smoker,    breath sounds clear to auscultation       Cardiovascular hypertension, + CAD   Rhythm:Regular Rate:Normal     Neuro/Psych    GI/Hepatic   Endo/Other  diabetesHypothyroidism Morbid obesity  Renal/GU      Musculoskeletal  (+) Arthritis ,   Abdominal (+) + obese,   Peds  Hematology   Anesthesia Other Findings   Reproductive/Obstetrics                            Anesthesia Physical Anesthesia Plan  ASA: III  Anesthesia Plan: MAC   Post-op Pain Management:    Induction: Intravenous  Airway Management Planned: Simple Face Mask and Natural Airway  Additional Equipment:   Intra-op Plan:   Post-operative Plan:   Informed Consent: I have reviewed the patients History and Physical, chart, labs and discussed the procedure including the risks, benefits and alternatives for the proposed anesthesia with the patient or authorized representative who has indicated his/her understanding and acceptance.     Plan Discussed with: CRNA  Anesthesia Plan Comments:         Anesthesia Quick Evaluation

## 2016-05-12 ENCOUNTER — Encounter (HOSPITAL_COMMUNITY): Payer: Self-pay

## 2016-05-12 ENCOUNTER — Ambulatory Visit (HOSPITAL_COMMUNITY): Payer: 59 | Admitting: Anesthesiology

## 2016-05-12 ENCOUNTER — Ambulatory Visit (HOSPITAL_COMMUNITY)
Admission: RE | Admit: 2016-05-12 | Discharge: 2016-05-12 | Disposition: A | Discharge status: Home / Self Care | Payer: 59 | Source: Ambulatory Visit | Attending: Gastroenterology | Admitting: Gastroenterology

## 2016-05-12 ENCOUNTER — Encounter (HOSPITAL_COMMUNITY)
Admission: RE | Disposition: A | Discharge status: Home / Self Care | Payer: Self-pay | Source: Ambulatory Visit | Attending: Gastroenterology

## 2016-05-12 DIAGNOSIS — I10 Essential (primary) hypertension: Secondary | ICD-10-CM | POA: Diagnosis not present

## 2016-05-12 DIAGNOSIS — Z89421 Acquired absence of other right toe(s): Secondary | ICD-10-CM | POA: Insufficient documentation

## 2016-05-12 DIAGNOSIS — E039 Hypothyroidism, unspecified: Secondary | ICD-10-CM | POA: Diagnosis not present

## 2016-05-12 DIAGNOSIS — F329 Major depressive disorder, single episode, unspecified: Secondary | ICD-10-CM | POA: Insufficient documentation

## 2016-05-12 DIAGNOSIS — Z79899 Other long term (current) drug therapy: Secondary | ICD-10-CM | POA: Insufficient documentation

## 2016-05-12 DIAGNOSIS — Z6839 Body mass index (BMI) 39.0-39.9, adult: Secondary | ICD-10-CM | POA: Insufficient documentation

## 2016-05-12 DIAGNOSIS — F17210 Nicotine dependence, cigarettes, uncomplicated: Secondary | ICD-10-CM | POA: Insufficient documentation

## 2016-05-12 DIAGNOSIS — Z9641 Presence of insulin pump (external) (internal): Secondary | ICD-10-CM | POA: Diagnosis not present

## 2016-05-12 DIAGNOSIS — E1142 Type 2 diabetes mellitus with diabetic polyneuropathy: Secondary | ICD-10-CM | POA: Insufficient documentation

## 2016-05-12 DIAGNOSIS — Z794 Long term (current) use of insulin: Secondary | ICD-10-CM | POA: Insufficient documentation

## 2016-05-12 DIAGNOSIS — I2510 Atherosclerotic heart disease of native coronary artery without angina pectoris: Secondary | ICD-10-CM | POA: Insufficient documentation

## 2016-05-12 DIAGNOSIS — K635 Polyp of colon: Secondary | ICD-10-CM | POA: Insufficient documentation

## 2016-05-12 DIAGNOSIS — K5730 Diverticulosis of large intestine without perforation or abscess without bleeding: Secondary | ICD-10-CM | POA: Insufficient documentation

## 2016-05-12 DIAGNOSIS — F419 Anxiety disorder, unspecified: Secondary | ICD-10-CM | POA: Insufficient documentation

## 2016-05-12 DIAGNOSIS — G4733 Obstructive sleep apnea (adult) (pediatric): Secondary | ICD-10-CM | POA: Diagnosis not present

## 2016-05-12 DIAGNOSIS — Z1211 Encounter for screening for malignant neoplasm of colon: Secondary | ICD-10-CM | POA: Insufficient documentation

## 2016-05-12 DIAGNOSIS — K621 Rectal polyp: Secondary | ICD-10-CM | POA: Diagnosis not present

## 2016-05-12 HISTORY — PX: COLONOSCOPY WITH PROPOFOL: SHX5780

## 2016-05-12 LAB — GLUCOSE, CAPILLARY
GLUCOSE-CAPILLARY: 83 mg/dL (ref 65–99)
GLUCOSE-CAPILLARY: 92 mg/dL (ref 65–99)

## 2016-05-12 SURGERY — COLONOSCOPY WITH PROPOFOL
Anesthesia: Monitor Anesthesia Care

## 2016-05-12 MED ORDER — LIDOCAINE 2% (20 MG/ML) 5 ML SYRINGE
INTRAMUSCULAR | Status: AC
  Filled 2016-05-12: qty 5

## 2016-05-12 MED ORDER — ONDANSETRON HCL 4 MG/2ML IJ SOLN
INTRAMUSCULAR | Status: DC | PRN
  Administered 2016-05-12: 4 mg via INTRAVENOUS

## 2016-05-12 MED ORDER — PROPOFOL 10 MG/ML IV BOLUS
INTRAVENOUS | Status: DC | PRN
  Administered 2016-05-12 (×2): 20 mg via INTRAVENOUS

## 2016-05-12 MED ORDER — SODIUM CHLORIDE 0.9 % IV SOLN: INTRAVENOUS | Status: DC

## 2016-05-12 MED ORDER — PROPOFOL 500 MG/50ML IV EMUL
INTRAVENOUS | Status: DC | PRN
  Administered 2016-05-12: 200 ug/kg/min via INTRAVENOUS

## 2016-05-12 MED ORDER — PROPOFOL 10 MG/ML IV BOLUS
INTRAVENOUS | Status: AC
  Filled 2016-05-12: qty 20

## 2016-05-12 MED ORDER — LIDOCAINE 2% (20 MG/ML) 5 ML SYRINGE
INTRAMUSCULAR | Status: DC | PRN
  Administered 2016-05-12: 100 mg via INTRAVENOUS

## 2016-05-12 MED ORDER — ONDANSETRON HCL 4 MG/2ML IJ SOLN
INTRAMUSCULAR | Status: AC
  Filled 2016-05-12: qty 2

## 2016-05-12 MED ORDER — LACTATED RINGERS IV SOLN
INTRAVENOUS | Status: DC
  Administered 2016-05-12: 1000 mL via INTRAVENOUS

## 2016-05-12 MED ORDER — PROPOFOL 10 MG/ML IV BOLUS
INTRAVENOUS | Status: AC
  Filled 2016-05-12: qty 40

## 2016-05-12 SURGICAL SUPPLY — 22 items

## 2016-05-12 NOTE — Op Note (Signed)
[facility] Patient Name: [name] Procedure Date: 05/12/2016 MRN: [id number] Attending MD: Juanita Craver , MD Date of Birth: 1955-10-13 CSN: [id number] Age: 61 Admit Type: Outpatient Procedure:                Colonoscopy with cold biopsies & snare polypectomy                            x 1. Indications:              CRC screening for colorectal malignant neoplasm. Providers:                Juanita Craver, MD, Elmer Ramp. Tilden Dome, RN, William Dalton, Technician, Virgia Land, CRNA. Referring MD:             Everlene Farrier, MD, Richard A. Reynaldo Minium, MD Medicines:                Monitored Anesthesia Care Complications:            No immediate complications. Estimated Blood Loss:     Estimated blood loss: minimal. Procedure:                Pre-anesthesia assessment: - Prior to the                            procedure, a history and physical was performed,                            and patient medications and allergies were                            reviewed. The patient's tolerance of previous                            anesthesia was also reviewed. The risks and                            benefits of the procedure and the sedation options                            and risks were discussed with the patient. All                            questions were answered, and informed consent was                            obtained. Prior anticoagulants: The patient has                            taken previous NSAID medication, last dose was 4                            days prior to procedure. ASA Grade assessment: III                            -  A patient with severe systemic disease. After                            reviewing the risks and benefits, the patient was                            deemed in satisfactory condition to undergo the                            procedure.                           After obtaining informed consent, the  colonoscope                            was passed under direct vision. Throughout the                            procedure, the patient's blood pressure, pulse, and                            oxygen saturations were monitored continuously. The                            EC-[id number] ([id number]) scope was introduced through                            the anus and advanced to the the terminal ileum,                            with identification of the appendiceal orifice and                            IC valve. The colonoscopy was extremely difficult                            due to inadequate bowel prep. Completion of the                            procedure was aided by lavage. The patient                            tolerated the procedure well. The quality of the                            bowel preparation was adequate. The terminal ileum,                            the ileocecal valve, the appendiceal orifice and                            the rectum were photographed. The bowel preparation  used was GoLYTELY. Scope In: 7:24:00 AM Scope Out: 7:45:08 AM Scope Withdrawal Time: 0 hours 10 minutes 8 seconds  Total Procedure Duration: 0 hours 21 minutes 8 seconds  Findings:      The perianal and digital rectal examinations were normal.      Three small sessile polyps were found in the rectum-removed by cold       biopsies.      A 7 mm sessile polyp was found in the cecum; the polyp was removed with       a hot snare x 1-150/15; resection and retrieval were complete.      The terminal ileum appeared normal.      A few medium-mouthed diverticula were found in the sigmoid colon; there       was no evidence of diverticular bleeding.      No additional abnormalities were found on retroflexion. Impression:               - Three small sessile polyps in the rectum-removed                            by cold biopsies.                           - One 7 mm sessile polyp in the  cecum, removed with                            a hot snare x 1; resected and retrieved.                           - The examined portion of the ileum was normal.                           - Mild diverticulosis in the sigmoid colon; there                            was no evidence of diverticular bleeding.                           - Large amount of residual stool in the colon;                            small polyps could be missed. Moderate Sedation:      MAC used. Recommendation:           - High fiber diet with augmented water consumption                            daily.                           - Continue present medications.                           - No Ibuprofen, Naproxen, or other non-steroidal                            anti-inflammatory drugs for 2 weeks  after polyp                            removal.                           - Await pathology results.                           - Repeat colonoscopy in 5-10 years for surveillance.                           - Return to GI office PRN.                           - If the patient has any abnormal GI symptoms in                            the interim, she has been advised to call the                            office ASAP for further recommendations. Procedure Code(s):        --- Professional ---                           [id number], GC, Colonoscopy, flexible; with removal of                            tumor(s), polyp(s), or other lesion(s) by snare                            technique                           53976, 85, Colonoscopy, flexible; with biopsy,                            single or multiple Diagnosis Code(s):        --- Professional ---                           Z12.11, Encounter for screening for malignant                            neoplasm of colon                           D12.0, Benign neoplasm of cecum                           D12.8, Benign neoplasm of rectum                           K57.30, Diverticulosis of  large intestine without                            perforation or abscess without bleeding  CPT copyright 2016 American [employer] Association. All rights reserved. The codes documented in this report are preliminary and upon coder review may  be revised to meet current compliance requirements. Juanita Craver, MD Juanita Craver, MD 05/12/2016 8:06:29 AM This report has been signed electronically. Number of Addenda: 0

## 2016-05-12 NOTE — Anesthesia Postprocedure Evaluation (Addendum)
Anesthesia Post Note  Patient: [name]  Procedure(s) Performed: Procedure(s) (LRB): COLONOSCOPY WITH PROPOFOL (N/A)  Patient location during evaluation: Endoscopy Anesthesia Type: MAC Level of consciousness: awake and alert Pain management: pain level controlled Vital Signs Assessment: post-procedure vital signs reviewed and stable Respiratory status: spontaneous breathing, nonlabored ventilation, respiratory function stable and patient connected to nasal cannula oxygen Cardiovascular status: stable and blood pressure returned to baseline Anesthetic complications: no       Last Vitals:  Vitals:   05/12/16 0820 05/12/16 0822  BP: (!) 119/50 (!) 119/50  Pulse: 70 68  Resp: 16 20  Temp:      Last Pain:  Vitals:   05/12/16 0801  TempSrc: Oral                 Sakshi Sermons,JAMES TERRILL

## 2016-05-12 NOTE — Transfer of Care (Signed)
Immediate Anesthesia Transfer of Care Note  Patient: [name]  Procedure(s) Performed: Procedure(s): COLONOSCOPY WITH PROPOFOL (N/A)  Patient Location: PACU  Anesthesia Type:MAC  Level of Consciousness:  sedated, patient cooperative and responds to stimulation  Airway & Oxygen Therapy:Patient Spontanous Breathing and Patient connected to face mask oxgen  Post-op Assessment:  Report given to PACU RN and Post -op Vital signs reviewed and stable  Post vital signs:  Reviewed and stable  Last Vitals:  Vitals:   05/12/16 0633 05/12/16 0638  BP:  (!) 129/50  Pulse: 67   Resp: 10   Temp: 68.1 C     Complications: No apparent anesthesia complications

## 2016-05-12 NOTE — Discharge Instructions (Signed)

## 2016-05-12 NOTE — H&P (Signed)
[name] is an 61 y.o. female.   Chief Complaint: Colorectal cancer screening. HPI: 61 year old white female here for a screening colonoscopy. See office notes for details.   Past Medical History:  Diagnosis Date  . Anxiety   . Arthritis    knees  . Coronary artery disease   . Depression   . Fatty liver   . History of nuclear stress test 12/16/2011   exercise myoview; normal images with 2-4mm ST-segment depression - subsequent cath revelaed subtotally occluded small 2nd marginal branch & 75% PDA lesion, normal LV function  . Hypertension   . Hypothyroidism   . Neuropathy ([place])    in feet  . OSA on CPAP    AHI = 44 (per patient) setting 13 per pt  . Personal history of kidney stones   . Primary localized osteoarthritis of left knee 04/24/2015  . Primary localized osteoarthritis of right knee 07/02/2015  . Tobacco abuse   . Type 2 diabetes mellitus (HCC)    insulin pump   Past Surgical History:  Procedure Laterality Date  . 3rd right toe amputation  02/2016  . AMPUTATION Right 12/22/2014   Procedure: RIGHT 2ND TOE AMPUTATION;  Surgeon: [name], MD;  Location: [facility];  Service: Orthopedics;  Laterality: Right;  . BREAST ENHANCEMENT SURGERY Bilateral   . CARDIAC CATHETERIZATION  01/04/2012   subtotally occluded small 2nd marginal branch & 75% PDA lesion, normal LV function  . CATARACT EXTRACTION Bilateral    with lens implants  . CHOLECYSTECTOMY  2008  . COLONOSCOPY    . I&D KNEE WITH POLY EXCHANGE Left 05/13/2015   Procedure: IRRIGATION AND DEBRIDEMENT KNEE WITH POLY EXCHANGE;  Surgeon: Marchia Bond, MD;  Location: [place];  Service: Orthopedics;  Laterality: Left;  . LEFT HEART CATHETERIZATION WITH CORONARY ANGIOGRAM N/A 01/04/2012   Procedure: LEFT HEART CATHETERIZATION WITH CORONARY ANGIOGRAM;  Surgeon: Lorretta Harp, MD;  Location[facility] CATH LAB;  Service: Cardiovascular;  Laterality: N/A;  . PARTIAL KNEE ARTHROPLASTY Left 04/24/2015   Procedure: LEFT  UNICOMPARTMENTAL KNEE ARTHROPLASTY ;  Surgeon: Marchia Bond, MD;  Location: [place];  Service: Orthopedics;  Laterality: Left;  . PARTIAL KNEE ARTHROPLASTY Right 07/02/2015   Procedure: RIGHT UNI KNEE ARTHROPLASTY;  Surgeon: Marchia Bond, MD;  Location: [place];  Service: Orthopedics;  Laterality: Right;  ANESTHESIA:  GENERAL, PRE/POST OP FEMORAL NERVE  . POSTERIOR CERVICAL FUSION/FORAMINOTOMY  1990  . ROBOTIC ASSISTED TOTAL HYSTERECTOMY WITH BILATERAL SALPINGO OOPHERECTOMY Bilateral 02/17/2015   Procedure: ROBOTIC ASSISTED TOTAL HYSTERECTOMY WITH BILATERAL SALPINGO OOPHORECTOMY;  Surgeon: Everitt Amber, MD;  Location: [facility];  Service: Gynecology;  Laterality: Bilateral;   Family History  Problem Relation Age of Onset  . Stroke Mother   . Hypertension Mother   . Diabetes Mother   . Alzheimer's disease Mother   . Diabetes Father   . COPD Father     vent-dependent, MODS  . Hypertension Sister   . Mental illness Sister     borderline personality d/o  . Mental illness Sister     schizoeffective d/o  . Diabetes Sister    Social History:  reports that she has been smoking Cigarettes.  She has a 25.00 pack-year smoking history. She has never used smokeless tobacco. She reports that she does not drink alcohol or use drugs.  Allergies: No Known Allergies  Facility-Administered Medications Prior to Admission  Medication Dose Route Frequency Provider Last Rate Last Dose  . betamethasone acetate-betamethasone sodium phosphate (CELESTONE)  injection 3 mg  3 mg Intramuscular Once Edrick Kins, DPM       Medications Prior to Admission  Medication Sig Dispense Refill  . ALPRAZolam (XANAX) 0.5 MG tablet Take 1 tablet by mouth 3 (three) times daily as needed.    Marland Kitchen atorvastatin (LIPITOR) 20 MG tablet Take 20 mg by mouth daily.    Marland Kitchen buPROPion (WELLBUTRIN SR) 200 MG 12 hr tablet Take 200 mg by mouth daily.     . diclofenac sodium (VOLTAREN) 1 % GEL Apply 1  application topically 4 (four) times daily as needed.     Marland Kitchen escitalopram (LEXAPRO) 20 MG tablet Take 40 mg by mouth daily.    Marland Kitchen gabapentin (NEURONTIN) 300 MG capsule Take 300 mg by mouth 3 (three) times daily.    Marland Kitchen HUMALOG 100 UNIT/ML injection Pt uses in her Insulin Pump    . Insulin Human (INSULIN PUMP) SOLN Inject into the skin continuous. Humalog - basal rate 2.75/hr    . levothyroxine (SYNTHROID, LEVOTHROID) 112 MCG tablet Take 112 mcg by mouth daily before breakfast.    . OVER THE COUNTER MEDICATION Pt uses CPAP machine    . valsartan-hydrochlorothiazide (DIOVAN-HCT) 80-12.5 MG per tablet Take 1 tablet by mouth daily.    . Vitamin D, Ergocalciferol, (DRISDOL) 50000 units CAPS capsule Take 50,000 Units by mouth every Monday.     Marland Kitchen ACCU-CHEK AVIVA PLUS test strip      No results found for this or any previous visit (from the past 48 hour(s)). No results found.  Review of Systems  Constitutional: Negative.   HENT: Negative.   Eyes: Negative.   Respiratory: Negative.   Cardiovascular: Negative.   Gastrointestinal: Negative.   Genitourinary: Negative.   Musculoskeletal: Positive for joint pain.  Skin: Negative.   Neurological: Negative.   Endo/Heme/Allergies: Negative.   Psychiatric/Behavioral: Positive for depression. The patient is nervous/anxious.    Blood pressure (!) 129/50, pulse 67, temperature 97.8 F (36.6 C), temperature source Oral, resp. rate 10, height 5\' 7"  (1.702 m), weight 114.3 kg (252 lb), SpO2 97 %. Physical Exam  Constitutional: She is oriented to person, place, and time. She appears well-developed and well-nourished.  HENT:  Head: Normocephalic and atraumatic.  Eyes: Conjunctivae and EOM are normal. Pupils are equal, round, and reactive to light.  Neck: Normal range of motion.  Cardiovascular: Normal rate and regular rhythm.   Respiratory: Effort normal and breath sounds normal.  GI: Soft. Bowel sounds are normal.  Musculoskeletal: Normal range of motion.   Neurological: She is alert and oriented to person, place, and time.  Skin: Skin is warm and dry.  Psychiatric: She has a normal mood and affect. Her behavior is normal. Judgment and thought content normal.    Assessment/Plan Colorectal cancer screening: Proceed with a colonoscopy.  Anber Mckiver, MD 05/12/2016, 7:05 AM

## 2016-05-15 ENCOUNTER — Encounter (HOSPITAL_COMMUNITY): Payer: Self-pay | Admitting: Gastroenterology

## 2016-05-27 NOTE — Telephone Encounter (Signed)
Please schedule patient for shoe measurement with Liliane Channel

## 2016-06-07 ENCOUNTER — Ambulatory Visit (INDEPENDENT_AMBULATORY_CARE_PROVIDER_SITE_OTHER): Payer: 59 | Admitting: Podiatry

## 2016-06-07 DIAGNOSIS — E08621 Diabetes mellitus due to underlying condition with foot ulcer: Secondary | ICD-10-CM

## 2016-06-07 DIAGNOSIS — L97512 Non-pressure 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: Secondary | ICD-10-CM

## 2016-06-07 NOTE — Progress Notes (Signed)
Patient presents for diabetic shoe measurement.  Patient is Diabetic, insulin dependent.Marland KitchenMarland KitchenMarland KitchenBeen dianosed since 2004.   Patient presents with bilateral neuropathy and hx toe amputation 2/3 on rt foot.    Patient measures 9 w on ritz stick...she chose

## 2016-06-15 ENCOUNTER — Telehealth: Payer: Self-pay | Admitting: *Deleted

## 2016-06-15 NOTE — Telephone Encounter (Signed)
[place] on behave [place] request measurements for [employer] applied on 02/08/2016, 01/20/2016, and 01/04/2016.06/15/2016-I informed [facility] pt had 16 sq cm applied on each date.

## 2016-06-29 NOTE — Telephone Encounter (Signed)
[employer]

## 2016-06-29 NOTE — Telephone Encounter (Signed)
Called and left message.

## 2016-07-22 NOTE — Addendum Note (Signed)
Addendum  created 07/22/16 1246 by Rica Koyanagi, MD   Sign clinical note

## 2016-07-25 ENCOUNTER — Ambulatory Visit: Payer: BLUE CROSS/BLUE SHIELD | Admitting: Podiatry

## 2016-08-03 ENCOUNTER — Telehealth: Payer: Self-pay | Admitting: Podiatry

## 2016-08-03 NOTE — Telephone Encounter (Signed)
Pt checking on status of diabetic shoes

## 2016-08-04 NOTE — Telephone Encounter (Signed)
lvm for pt that authorization for her diabetic shoes has expired and we have to start the paper work over. Dr Amalia Hailey will do this at her appt on 6.27.18

## 2016-08-16 NOTE — Telephone Encounter (Signed)
k

## 2016-08-17 ENCOUNTER — Encounter: Payer: Self-pay | Admitting: Podiatry

## 2016-08-17 ENCOUNTER — Ambulatory Visit (INDEPENDENT_AMBULATORY_CARE_PROVIDER_SITE_OTHER): Payer: BLUE CROSS/BLUE SHIELD | Admitting: Podiatry

## 2016-08-17 ENCOUNTER — Ambulatory Visit (INDEPENDENT_AMBULATORY_CARE_PROVIDER_SITE_OTHER): Payer: BLUE CROSS/BLUE SHIELD

## 2016-08-17 DIAGNOSIS — Z89421 Acquired absence of other right toe(s): Secondary | ICD-10-CM

## 2016-08-17 DIAGNOSIS — M25571 Pain in right ankle and joints of right foot: Secondary | ICD-10-CM | POA: Diagnosis not present

## 2016-08-17 DIAGNOSIS — E0842 Diabetes mellitus due to underlying condition with diabetic polyneuropathy: Secondary | ICD-10-CM

## 2016-08-17 DIAGNOSIS — L97512 Non-pressure 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: Secondary | ICD-10-CM | POA: Diagnosis not present

## 2016-08-17 DIAGNOSIS — I70235 Atherosclerosis of native arteries of right leg with ulceration of other part of foot: Secondary | ICD-10-CM

## 2016-08-18 NOTE — Progress Notes (Signed)
   Subjective:  Patient presents today for pain and tenderness to the medial side of the right ankle that began three days ago after twisting it while walking. She also reports a lesion to the right great toe that has been present for the past three weeks. He reports associated clear drainage from the area. Patient relates significant pain and tenderness when walking. She also reports that she wants diabetic shoes. Patient presents for further treatment and evaluation.   Objective / Physical Exam:  General:  The patient is alert and oriented x3 in no acute distress. Dermatology:  Wound #1 noted to the right great toe measuring 0.8 x 0.8 x 0.1 cm (LxWxD).   To the noted ulceration(s), there is no eschar. There is a moderate amount of slough, fibrin, and necrotic tissue noted. Granulation tissue and wound base is red. There is a minimal amount of serosanguineous drainage noted. There is no exposed bone muscle-tendon ligament or joint. There is no malodor. Periwound integrity is intact. Skin is warm, dry and supple bilateral lower extremities.  Vascular:  Palpable pedal pulses bilaterally. No edema or erythema noted. Capillary refill within normal limits. Neurological:  Epicritic and protective threshold grossly intact bilaterally.  Musculoskeletal Exam:  Pain on palpation to the anterior lateral medial aspects of the patient's right ankle. Mild edema noted.  Range of motion within normal limits to all pedal and ankle joints bilateral. Muscle strength 5/5 in all groups bilateral.   Radiographic Exam:  Normal osseous mineralization. Joint spaces preserved. No fracture/dislocation/boney destruction.    Assessment: #1 right ankle sprain, grade I #2 right great toe ulcer secondary to diabetes mellitus #3 right ankle edema  Plan of Care:  #1 Patient was evaluated. X-rays reviewed. #2 medically necessary excisional debridement including subcutaneous tissue was performed using a tissue nipper  and a chisel blade. Excisional debridement of all the necrotic nonviable tissue down to healthy bleeding viable tissue was performed with post-debridement measurements same as pre-. #3 the wound was cleansed and dry sterile dressing applied. #4 compression anklet dispensed for the right ankle. #5 authorization for diabetic shoes today. Patient was molded two months ago. #6 return to clinic in 3 weeks.   Edrick Kins, DPM [facility]  Dr. Edrick Kins, [address]                                        [place], [place] [address]                Office [phone]  Fax [phone]

## 2016-08-22 ENCOUNTER — Ambulatory Visit (INDEPENDENT_AMBULATORY_CARE_PROVIDER_SITE_OTHER): Payer: BLUE CROSS/BLUE SHIELD | Admitting: Podiatry

## 2016-08-22 ENCOUNTER — Encounter: Payer: Self-pay | Admitting: Podiatry

## 2016-08-22 DIAGNOSIS — L97522 Non-pressure chronic ulcer of other part of left foot with fat layer exposed: Secondary | ICD-10-CM

## 2016-08-22 DIAGNOSIS — E0842 Diabetes mellitus due to underlying condition with diabetic polyneuropathy: Secondary | ICD-10-CM | POA: Diagnosis not present

## 2016-08-22 MED ORDER — SULFAMETHOXAZOLE-TRIMETHOPRIM 800-160 MG PO TABS: 1.0000 | ORAL_TABLET | Freq: Two times a day (BID) | ORAL | 0 refills | Status: DC

## 2016-08-24 NOTE — Progress Notes (Signed)
   Subjective:  Patient with a history of diabetes mellitus presents today for evaluation of pain to the left fifth toenail. She states the toenail came off three days ago and she is now experiencing swelling. She reports associated redness and discoloration. Patient presents today for further treatment and evaluation   Objective/Physical Exam General: The patient is alert and oriented x3 in no acute distress.  Dermatology:  Wound #1 noted to the left fifth toe measuring 1.0 x 1.0 x 0.2 cm (LxWxD).   To the noted ulceration(s), there is no eschar. There is a moderate amount of slough, fibrin, and necrotic tissue noted. Granulation tissue and wound base is red. There is a minimal amount of serosanguineous drainage noted. There is no exposed bone muscle-tendon ligament or joint. There is no malodor. Periwound integrity is intact. Skin is warm, dry and supple bilateral lower extremities.  Vascular: Palpable pedal pulses bilaterally. No edema or erythema noted. Capillary refill within normal limits.  Neurological: Epicritic and protective threshold absent bilaterally.   Musculoskeletal Exam: Range of motion within normal limits to all pedal and ankle joints bilateral. Muscle strength 5/5 in all groups bilateral.   Assessment: #1 Ulceration to the left fifth toe secondary to diabetes mellitus #2 diabetes mellitus w/ peripheral neuropathy   Plan of Care:  #1 Patient was evaluated. #2 medically necessary excisional debridement including subcutaneous tissue was performed using a tissue nipper and a chisel blade. Excisional debridement of all the necrotic nonviable tissue down to healthy bleeding viable tissue was performed with post-debridement measurements same as pre-. #3 the wound was cleansed and dry sterile dressing applied. #4 Cultures taken. #5 Prescription for Bactrim DS given to patient. #6 Prisma and dressings provided. #7 Return to clinic in 2 weeks.   Edrick Kins, DPM [facility]  Dr. Edrick Kins, [address]                                        [place], [place] [address]                Office [phone]  Fax [phone]

## 2016-08-25 LAB — WOUND CULTURE
GRAM STAIN: NONE SEEN
Gram Stain: NONE SEEN

## 2016-09-05 ENCOUNTER — Ambulatory Visit (INDEPENDENT_AMBULATORY_CARE_PROVIDER_SITE_OTHER): Payer: BLUE CROSS/BLUE SHIELD | Admitting: Podiatry

## 2016-09-05 DIAGNOSIS — L97522 Non-pressure chronic ulcer of other part of left foot with fat layer exposed: Secondary | ICD-10-CM

## 2016-09-05 DIAGNOSIS — L97512 Non-pressure 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: Secondary | ICD-10-CM

## 2016-09-05 DIAGNOSIS — E0842 Diabetes mellitus due to underlying condition with diabetic polyneuropathy: Secondary | ICD-10-CM

## 2016-09-05 DIAGNOSIS — I70245 Atherosclerosis of native arteries of left leg with ulceration of other part of foot: Secondary | ICD-10-CM

## 2016-09-05 DIAGNOSIS — I70235 Atherosclerosis of native arteries of right leg with ulceration of other part of foot: Secondary | ICD-10-CM

## 2016-09-05 MED ORDER — SULFAMETHOXAZOLE-TRIMETHOPRIM 800-160 MG PO TABS: 1.0000 | ORAL_TABLET | Freq: Two times a day (BID) | ORAL | 0 refills | Status: DC

## 2016-09-07 ENCOUNTER — Ambulatory Visit: Payer: BLUE CROSS/BLUE SHIELD | Admitting: Podiatry

## 2016-09-11 NOTE — Progress Notes (Signed)
   Subjective:  Patient presents today for follow-up treatment for ulcers to the bilateral feet secondary to diabetes mellitus. Patient is still continuing to take the oral Bactrim DS. Last visit cultures were taken and sent to pathology for Gram stain and sensitivities. Patient believes overall she is doing well. Patient also has new complaint of a painful callus lesion to the right great toe and left second digit. Patient presents today for further treatment and evaluation  Objective/Physical Exam General: The patient is alert and oriented x3 in no acute distress.  Dermatology:  Wound #1 noted to the fifth digit left foot measuring approximately [phone] cm (LxWxD).   There is a new ulceration wound #2 located to the right great toe measuring approximately [phone] cm.  Another new ulceration noted to the plantar aspect of the first MPJ right foot measuring [phone] cm.  To the noted ulceration(s), there is no eschar. There is a moderate amount of slough, fibrin, and necrotic tissue noted. Granulation tissue and wound base is red. There is a minimal amount of serosanguineous drainage noted. There is no exposed bone muscle-tendon ligament or joint. There is no malodor. Periwound integrity is intact. Skin is warm, dry and supple bilateral lower extremities.  Vascular: Palpable pedal pulses bilaterally. No edema or erythema noted. Capillary refill within normal limits.  Neurological: Epicritic and protective threshold absent bilaterally.   Musculoskeletal Exam: Range of motion within normal limits to all pedal and ankle joints bilateral. Muscle strength 5/5 in all groups bilateral.   Assessment: #1 bilateral foot ulcerations secondary to diabetes mellitus #2 diabetes mellitus w/ peripheral neuropathy   Plan of Care:  #1 Patient was evaluated. #2 medically necessary excisional debridement including subcutaneous tissue was performed using a tissue nipper and a chisel blade.  Excisional debridement of all the necrotic nonviable tissue down to healthy bleeding viable tissue was performed with post-debridement measurements same as pre-. #3 the wound was cleansed and dry sterile dressing applied. #4 refill prescription for Bactrim DS today #5 Prisma and normal saline was provided for the patient for home dressing changes daily. #6 return to clinic in 2 weeks  Edrick Kins, DPM [facility]  Dr. Edrick Kins, [address]                                        [place], [place] [address]                Office [phone]  Fax [phone]

## 2016-09-19 ENCOUNTER — Ambulatory Visit: Payer: BLUE CROSS/BLUE SHIELD | Admitting: Podiatry

## 2016-10-03 ENCOUNTER — Ambulatory Visit: Payer: BLUE CROSS/BLUE SHIELD | Admitting: Podiatry

## 2016-10-06 ENCOUNTER — Encounter (HOSPITAL_COMMUNITY): Payer: Self-pay

## 2016-10-06 ENCOUNTER — Inpatient Hospital Stay (HOSPITAL_COMMUNITY): Payer: BLUE CROSS/BLUE SHIELD

## 2016-10-06 ENCOUNTER — Emergency Department (HOSPITAL_COMMUNITY): Payer: BLUE CROSS/BLUE SHIELD

## 2016-10-06 ENCOUNTER — Inpatient Hospital Stay (HOSPITAL_COMMUNITY)
Admission: EM | Admit: 2016-10-06 | Discharge: 2016-10-08 | DRG: 871 | Disposition: A | Discharge status: Home / Self Care | Payer: BLUE CROSS/BLUE SHIELD | Attending: Internal Medicine | Admitting: Internal Medicine

## 2016-10-06 DIAGNOSIS — Z720 Tobacco use: Secondary | ICD-10-CM | POA: Diagnosis not present

## 2016-10-06 DIAGNOSIS — D72829 Elevated white blood cell count, unspecified: Secondary | ICD-10-CM | POA: Diagnosis not present

## 2016-10-06 DIAGNOSIS — R52 Pain, unspecified: Secondary | ICD-10-CM

## 2016-10-06 DIAGNOSIS — L97509 Non-pressure chronic ulcer of other part of unspecified foot with unspecified severity: Secondary | ICD-10-CM | POA: Diagnosis present

## 2016-10-06 DIAGNOSIS — E0865 Diabetes mellitus due to underlying condition with hyperglycemia: Secondary | ICD-10-CM

## 2016-10-06 DIAGNOSIS — Z90710 Acquired absence of both cervix and uterus: Secondary | ICD-10-CM | POA: Diagnosis not present

## 2016-10-06 DIAGNOSIS — E11621 Type 2 diabetes mellitus with foot ulcer: Secondary | ICD-10-CM | POA: Diagnosis present

## 2016-10-06 DIAGNOSIS — Z8249 Family history of ischemic heart disease and other diseases of the circulatory system: Secondary | ICD-10-CM | POA: Diagnosis not present

## 2016-10-06 DIAGNOSIS — Z794 Long term (current) use of insulin: Secondary | ICD-10-CM | POA: Diagnosis not present

## 2016-10-06 DIAGNOSIS — E039 Hypothyroidism, unspecified: Secondary | ICD-10-CM | POA: Diagnosis present

## 2016-10-06 DIAGNOSIS — Z79899 Other long term (current) drug therapy: Secondary | ICD-10-CM

## 2016-10-06 DIAGNOSIS — I2510 Atherosclerotic heart disease of native coronary artery without angina pectoris: Secondary | ICD-10-CM | POA: Diagnosis present

## 2016-10-06 DIAGNOSIS — E872 Acidosis: Secondary | ICD-10-CM | POA: Diagnosis present

## 2016-10-06 DIAGNOSIS — J40 Bronchitis, not specified as acute or chronic: Secondary | ICD-10-CM | POA: Diagnosis present

## 2016-10-06 DIAGNOSIS — E785 Hyperlipidemia, unspecified: Secondary | ICD-10-CM | POA: Diagnosis present

## 2016-10-06 DIAGNOSIS — Z9842 Cataract extraction status, left eye: Secondary | ICD-10-CM | POA: Diagnosis not present

## 2016-10-06 DIAGNOSIS — Z981 Arthrodesis status: Secondary | ICD-10-CM | POA: Diagnosis not present

## 2016-10-06 DIAGNOSIS — Z880 Allergy status to penicillin: Secondary | ICD-10-CM | POA: Diagnosis not present

## 2016-10-06 DIAGNOSIS — J189 Pneumonia, unspecified organism: Secondary | ICD-10-CM | POA: Diagnosis present

## 2016-10-06 DIAGNOSIS — E08621 Diabetes mellitus due to underlying condition with foot ulcer: Secondary | ICD-10-CM

## 2016-10-06 DIAGNOSIS — Z9841 Cataract extraction status, right eye: Secondary | ICD-10-CM | POA: Diagnosis not present

## 2016-10-06 DIAGNOSIS — G4733 Obstructive sleep apnea (adult) (pediatric): Secondary | ICD-10-CM | POA: Diagnosis present

## 2016-10-06 DIAGNOSIS — Z9641 Presence of insulin pump (external) (internal): Secondary | ICD-10-CM | POA: Diagnosis present

## 2016-10-06 DIAGNOSIS — IMO0002 Reserved for concepts with insufficient information to code with codable children: Secondary | ICD-10-CM | POA: Diagnosis present

## 2016-10-06 DIAGNOSIS — E1140 Type 2 diabetes mellitus with diabetic neuropathy, unspecified: Secondary | ICD-10-CM | POA: Diagnosis present

## 2016-10-06 DIAGNOSIS — E1165 Type 2 diabetes mellitus with hyperglycemia: Secondary | ICD-10-CM | POA: Diagnosis present

## 2016-10-06 DIAGNOSIS — R0600 Dyspnea, unspecified: Secondary | ICD-10-CM | POA: Diagnosis present

## 2016-10-06 DIAGNOSIS — R609 Edema, unspecified: Secondary | ICD-10-CM

## 2016-10-06 DIAGNOSIS — Z961 Presence of intraocular lens: Secondary | ICD-10-CM | POA: Diagnosis present

## 2016-10-06 DIAGNOSIS — I10 Essential (primary) hypertension: Secondary | ICD-10-CM | POA: Diagnosis present

## 2016-10-06 DIAGNOSIS — Z89421 Acquired absence of other right toe(s): Secondary | ICD-10-CM | POA: Diagnosis not present

## 2016-10-06 DIAGNOSIS — Z9989 Dependence on other enabling machines and devices: Secondary | ICD-10-CM

## 2016-10-06 DIAGNOSIS — L03031 Cellulitis of right toe: Secondary | ICD-10-CM | POA: Diagnosis present

## 2016-10-06 DIAGNOSIS — A419 Sepsis, unspecified organism: Secondary | ICD-10-CM | POA: Diagnosis not present

## 2016-10-06 DIAGNOSIS — F17210 Nicotine dependence, cigarettes, uncomplicated: Secondary | ICD-10-CM | POA: Diagnosis present

## 2016-10-06 DIAGNOSIS — Z96653 Presence of artificial knee joint, bilateral: Secondary | ICD-10-CM | POA: Diagnosis present

## 2016-10-06 LAB — COMPREHENSIVE METABOLIC PANEL
ALBUMIN: 3.4 g/dL — AB (ref 3.5–5.0)
ALT: 23 U/L (ref 14–54)
ANION GAP: 10 (ref 5–15)
AST: 32 U/L (ref 15–41)
Alkaline Phosphatase: 121 U/L (ref 38–126)
BILIRUBIN TOTAL: 0.8 mg/dL (ref 0.3–1.2)
BUN: 18 mg/dL (ref 6–20)
CHLORIDE: 98 mmol/L — AB (ref 101–111)
CO2: 24 mmol/L (ref 22–32)
Calcium: 9.4 mg/dL (ref 8.9–10.3)
Creatinine, Ser: 1.33 mg/dL — ABNORMAL HIGH (ref 0.44–1.00)
GFR calc Af Amer: 49 mL/min — ABNORMAL LOW (ref >60)
GFR calc non Af Amer: 42 mL/min — ABNORMAL LOW (ref >60)
Glucose, Bld: 240 mg/dL — ABNORMAL HIGH (ref 65–99)
POTASSIUM: 5 mmol/L (ref 3.5–5.1)
Sodium: 132 mmol/L — ABNORMAL LOW (ref 135–145)
Total Protein: 7 g/dL (ref 6.5–8.1)

## 2016-10-06 LAB — URINALYSIS, ROUTINE W REFLEX MICROSCOPIC
BILIRUBIN URINE: NEGATIVE
Glucose, UA: NEGATIVE mg/dL
HGB URINE DIPSTICK: NEGATIVE
Ketones, ur: NEGATIVE mg/dL
LEUKOCYTES UA: NEGATIVE
Nitrite: NEGATIVE
PH: 6 (ref 5.0–8.0)
Protein, ur: 30 mg/dL — AB
SPECIFIC GRAVITY, URINE: 1.016 (ref 1.005–1.030)

## 2016-10-06 LAB — CBC WITH DIFFERENTIAL/PLATELET
BASOS ABS: 0 10*3/uL (ref 0.0–0.1)
Basophils Relative: 0 %
Eosinophils Absolute: 0 10*3/uL (ref 0.0–0.7)
Eosinophils Relative: 0 %
HCT: 35.4 % — ABNORMAL LOW (ref 36.0–46.0)
Hemoglobin: 11.7 g/dL — ABNORMAL LOW (ref 12.0–15.0)
LYMPHS ABS: 0.9 10*3/uL (ref 0.7–4.0)
LYMPHS PCT: 6 %
MCH: 31.6 pg (ref 26.0–34.0)
MCHC: 33.1 g/dL (ref 30.0–36.0)
MCV: 95.7 fL (ref 78.0–100.0)
Monocytes Absolute: 0.6 10*3/uL (ref 0.1–1.0)
Monocytes Relative: 4 %
NEUTROS ABS: 15.2 10*3/uL — AB (ref 1.7–7.7)
Neutrophils Relative %: 90 %
PLATELETS: 187 10*3/uL (ref 150–400)
RBC: 3.7 MIL/uL — ABNORMAL LOW (ref 3.87–5.11)
RDW: 14.1 % (ref 11.5–15.5)
WBC: 16.8 10*3/uL — AB (ref 4.0–10.5)

## 2016-10-06 LAB — LACTIC ACID, PLASMA
LACTIC ACID, VENOUS: 2.5 mmol/L — AB (ref 0.5–1.9)
Lactic Acid, Venous: 3.2 mmol/L (ref 0.5–1.9)

## 2016-10-06 LAB — I-STAT CG4 LACTIC ACID, ED: LACTIC ACID, VENOUS: 3.8 mmol/L — AB (ref 0.5–1.9)

## 2016-10-06 LAB — GLUCOSE, CAPILLARY: GLUCOSE-CAPILLARY: 175 mg/dL — AB (ref 65–99)

## 2016-10-06 MED ORDER — SODIUM CHLORIDE 0.9 % IV BOLUS (SEPSIS)
1000.0000 mL | Freq: Once | INTRAVENOUS | Status: AC
  Administered 2016-10-06: 1000 mL via INTRAVENOUS

## 2016-10-06 MED ORDER — INSULIN ASPART 100 UNIT/ML ~~LOC~~ SOLN
0.0000 [IU] | Freq: Three times a day (TID) | SUBCUTANEOUS | Status: DC
  Administered 2016-10-07: 11 [IU] via SUBCUTANEOUS
  Administered 2016-10-07: 7 [IU] via SUBCUTANEOUS

## 2016-10-06 MED ORDER — ACETAMINOPHEN 325 MG PO TABS: 650.0000 mg | ORAL_TABLET | Freq: Four times a day (QID) | ORAL | Status: DC | PRN

## 2016-10-06 MED ORDER — ONDANSETRON HCL 4 MG PO TABS
4.0000 mg | ORAL_TABLET | Freq: Four times a day (QID) | ORAL | Status: DC | PRN
Start: 2016-10-06 — End: 2016-10-08

## 2016-10-06 MED ORDER — ESCITALOPRAM OXALATE 20 MG PO TABS
20.0000 mg | ORAL_TABLET | Freq: Every day | ORAL | Status: DC
  Administered 2016-10-06 – 2016-10-08 (×3): 20 mg via ORAL
  Filled 2016-10-06 (×3): qty 1

## 2016-10-06 MED ORDER — LEVOTHYROXINE SODIUM 25 MCG PO TABS
125.0000 ug | ORAL_TABLET | Freq: Every day | ORAL | Status: DC
  Administered 2016-10-07 – 2016-10-08 (×2): 125 ug via ORAL
  Filled 2016-10-06 (×2): qty 1

## 2016-10-06 MED ORDER — ACETAMINOPHEN 325 MG PO TABS
ORAL_TABLET | ORAL | Status: AC
Start: 2016-10-06 — End: 2016-10-06
  Filled 2016-10-06: qty 2

## 2016-10-06 MED ORDER — ALPRAZOLAM 0.5 MG PO TABS
0.5000 mg | ORAL_TABLET | Freq: Three times a day (TID) | ORAL | Status: DC | PRN
  Administered 2016-10-07 (×2): 0.5 mg via ORAL
  Filled 2016-10-06 (×2): qty 1

## 2016-10-06 MED ORDER — INSULIN PUMP
SUBCUTANEOUS | Status: DC
  Filled 2016-10-06: qty 1

## 2016-10-06 MED ORDER — LEVOFLOXACIN IN D5W 750 MG/150ML IV SOLN
750.0000 mg | INTRAVENOUS | Status: DC
  Administered 2016-10-07: 750 mg via INTRAVENOUS
  Filled 2016-10-06 (×2): qty 150

## 2016-10-06 MED ORDER — ATORVASTATIN CALCIUM 20 MG PO TABS
20.0000 mg | ORAL_TABLET | Freq: Every day | ORAL | Status: DC
  Administered 2016-10-06 – 2016-10-08 (×3): 20 mg via ORAL
  Filled 2016-10-06 (×3): qty 1

## 2016-10-06 MED ORDER — VANCOMYCIN HCL 10 G IV SOLR
1500.0000 mg | Freq: Once | INTRAVENOUS | Status: AC
  Administered 2016-10-06: 1500 mg via INTRAVENOUS
  Filled 2016-10-06: qty 1500

## 2016-10-06 MED ORDER — VANCOMYCIN HCL IN DEXTROSE 1-5 GM/200ML-% IV SOLN
1000.0000 mg | Freq: Once | INTRAVENOUS | Status: AC
  Administered 2016-10-06: 1000 mg via INTRAVENOUS
  Filled 2016-10-06: qty 200

## 2016-10-06 MED ORDER — TRAMADOL HCL 50 MG PO TABS
50.0000 mg | ORAL_TABLET | Freq: Four times a day (QID) | ORAL | Status: DC | PRN
  Administered 2016-10-07 – 2016-10-08 (×4): 50 mg via ORAL
  Filled 2016-10-06 (×4): qty 1

## 2016-10-06 MED ORDER — BUPROPION HCL ER (SR) 100 MG PO TB12
100.0000 mg | ORAL_TABLET | Freq: Two times a day (BID) | ORAL | Status: DC
  Administered 2016-10-06 – 2016-10-08 (×4): 100 mg via ORAL
  Filled 2016-10-06 (×4): qty 1

## 2016-10-06 MED ORDER — ACETAMINOPHEN 325 MG PO TABS
650.0000 mg | ORAL_TABLET | Freq: Once | ORAL | Status: AC
  Administered 2016-10-06: 650 mg via ORAL

## 2016-10-06 MED ORDER — VITAMIN D (ERGOCALCIFEROL) 1.25 MG (50000 UNIT) PO CAPS
50000.0000 [IU] | ORAL_CAPSULE | ORAL | Status: DC
  Administered 2016-10-08: 50000 [IU] via ORAL
  Filled 2016-10-06: qty 1

## 2016-10-06 MED ORDER — ENOXAPARIN SODIUM 40 MG/0.4ML ~~LOC~~ SOLN
40.0000 mg | SUBCUTANEOUS | Status: DC
  Administered 2016-10-06 – 2016-10-07 (×2): 40 mg via SUBCUTANEOUS
  Filled 2016-10-06 (×2): qty 0.4

## 2016-10-06 MED ORDER — GABAPENTIN 300 MG PO CAPS
300.0000 mg | ORAL_CAPSULE | Freq: Three times a day (TID) | ORAL | Status: DC
  Administered 2016-10-06 – 2016-10-08 (×5): 300 mg via ORAL
  Filled 2016-10-06 (×5): qty 1

## 2016-10-06 MED ORDER — INSULIN ASPART 100 UNIT/ML ~~LOC~~ SOLN: 0.0000 [IU] | Freq: Every day | SUBCUTANEOUS | Status: DC

## 2016-10-06 MED ORDER — INSULIN PUMP
SUBCUTANEOUS | Status: DC
  Administered 2016-10-06: 21:00:00 via SUBCUTANEOUS
  Filled 2016-10-06: qty 1

## 2016-10-06 MED ORDER — ONDANSETRON HCL 4 MG/2ML IJ SOLN
4.0000 mg | Freq: Four times a day (QID) | INTRAMUSCULAR | Status: DC | PRN
Start: 2016-10-06 — End: 2016-10-08
  Administered 2016-10-07: 4 mg via INTRAVENOUS
  Filled 2016-10-06: qty 2

## 2016-10-06 MED ORDER — SENNOSIDES-DOCUSATE SODIUM 8.6-50 MG PO TABS: 1.0000 | ORAL_TABLET | Freq: Every evening | ORAL | Status: DC | PRN

## 2016-10-06 MED ORDER — ACETAMINOPHEN 650 MG RE SUPP: 650.0000 mg | Freq: Four times a day (QID) | RECTAL | Status: DC | PRN

## 2016-10-06 MED ORDER — LEVOFLOXACIN IN D5W 750 MG/150ML IV SOLN
750.0000 mg | Freq: Once | INTRAVENOUS | Status: AC
  Administered 2016-10-06: 750 mg via INTRAVENOUS
  Filled 2016-10-06: qty 150

## 2016-10-06 MED ORDER — SODIUM CHLORIDE 0.9 % IV SOLN
INTRAVENOUS | Status: DC
  Administered 2016-10-06 – 2016-10-07 (×2): via INTRAVENOUS

## 2016-10-06 MED ORDER — VANCOMYCIN HCL 10 G IV SOLR
1250.0000 mg | INTRAVENOUS | Status: DC
  Filled 2016-10-06: qty 1250

## 2016-10-06 NOTE — ED Triage Notes (Signed)
Pt states she began having n/v last night. She states she was running fever. Pt alert but lethargic in triage and obviously feeling poorly. Febrile in triage.

## 2016-10-06 NOTE — Progress Notes (Signed)
Patient significant other brought in the wrong part for the insulin pump. Insulin is no longer working.  Notified the MD on call

## 2016-10-06 NOTE — Progress Notes (Signed)
Pharmacy Antibiotic Note  [name] is a 61 y.o. female who presented on 8/16 with nausea and chills.The patient is noted to have chronic B/L foot ulcers. Pharmacy has been consulted to start Vancomycin + Levaquin for r/o sepsis. SCr 1.33, CrCl~55-60 ml/min (normalized)  The patient already received Vancomycin 1g and Levaquin 750 mg in the [facility]  Plan: 1. Vancomycin 1500 mg IV x 1 dose (for a total loading dose of 2500 mg) 2. Vancomycin 1250 mg IV every 24 hours (next dose 8/17) 3. Levaquin 750 mg IV every 24 hours (next dose 8/17) 4. Will continue to follow renal function, culture results, LOT, and antibiotic de-escalation plans   Weight: 255 lb (115.7 kg)  Temp (24hrs), Avg:100 F (37.8 C), Min:98.2 F (36.8 C), Max:102.6 F (39.2 C)   Recent Labs Lab 10/06/16 1116 10/06/16 1447 10/06/16 1647  WBC 16.8*  --   --   CREATININE 1.33*  --   --   LATICACIDVEN  --  3.80* 3.2*    Estimated Creatinine Clearance: 57.6 mL/min (A) (by C-G formula based on SCr of 1.33 mg/dL (H)).    Allergies  Allergen Reactions  . Penicillins Hives    Childhood reaction, told she had hives    Antimicrobials this admission: Vanc 8/16 >> Levaquin 8/16 >>  Dose adjustments this admission: n/a  Microbiology results: 8/16 BCx >> 8/16 UCx >>  Thank you for allowing pharmacy to be a part of this patient's care.  Alycia Rossetti, PharmD, BCPS Clinical Pharmacist Pager: [phone] Clinical phone for 10/06/2016 from 2p-11p: [phone] If after 3:30p, please call main pharmacy at: [phone] 10/06/2016 6:52 PM

## 2016-10-06 NOTE — ED Notes (Signed)
Admitting MD at bedside.

## 2016-10-06 NOTE — H&P (Signed)
History and Physical    [name] VOJ:[id number] DOB: 07-Feb-1956 DOA: 10/06/2016  PCP: Burnard Bunting, MD   Patient coming from: Home   I have personally briefly reviewed patient's old medical records in [place]  Chief Complaint: Fever and chills  HPI: [name] is a 61 y.o. female with medical history significant of emphysematous type II on insulin pump, obstructive sleep apnea, chronic bilateral foot ulcers secondary to neuropathy status post right third toe amputation in January 2018, hypertension, hyperlipidemia, coronary artery disease, osteoarthritis and hypothyroidism presented with fever and chills that started last night. She complains of sudden onset of fever with shaking chills last evening along with cough with whitish expectoration, nausea and vomiting, increased appetite. Patient denies any recent travel, sick contacts, animal or insect bite. She also has noted increased redness of her right great toe for the last 1 or 2 days. She has history of chronic right foot ulcers for which she had seen her podiatrist a month ago and was given oral Bactrim which she is currently not on. Patient denies any excessive pain or discharge from her foot foot ulcers. Patient denies dysuria, hematuria, diarrhea, abdominal pain, loss of consciousness, headache, sore throat, chest pain or shortness of breath.   ED Course: Patient was found to be febrile to 102.6 along with tachycardia and tachypnea with elevated lactic acid. She was started on IV fluids and IV antibiotics. Chest x-ray on admission was unremarkable.   Review of Systems: As per HPI otherwise 10 point review of systems negative.    Past Medical History:  Diagnosis Date  . Anxiety   . Arthritis    knees  . Coronary artery disease   . Depression   . Fatty liver   . History of nuclear stress test 12/16/2011   exercise myoview; normal images with 2-62m ST-segment depression - subsequent cath revelaed  subtotally occluded small 2nd marginal branch & 75% PDA lesion, normal LV function  . Hypertension   . Hypothyroidism   . Neuropathy    in feet  . OSA on CPAP    AHI = 44 (per patient) setting 13 per pt  . Personal history of kidney stones   . Primary localized osteoarthritis of left knee 04/24/2015  . Primary localized osteoarthritis of right knee 07/02/2015  . Tobacco abuse   . Type 2 diabetes mellitus (HCC)    insulin pump    Past Surgical History:  Procedure Laterality Date  . 3rd right toe amputation  02/2016  . AMPUTATION Right 12/22/2014   Procedure: RIGHT 2ND TOE AMPUTATION;  Surgeon: JWylene Simmer MD;  Location: [facility]  Service: Orthopedics;  Laterality: Right;  . BREAST ENHANCEMENT SURGERY Bilateral   . CARDIAC CATHETERIZATION  01/04/2012   subtotally occluded small 2nd marginal branch & 75% PDA lesion, normal LV function  . CATARACT EXTRACTION Bilateral    with lens implants  . CHOLECYSTECTOMY  2008  . COLONOSCOPY    . COLONOSCOPY WITH PROPOFOL N/A 05/12/2016   Procedure: COLONOSCOPY WITH PROPOFOL;  Surgeon: JJuanita Craver MD;  Location: [facility];  Service: Endoscopy;  Laterality: N/A;  . I&D KNEE WITH POLY EXCHANGE Left 05/13/2015   Procedure: IRRIGATION AND DEBRIDEMENT KNEE WITH POLY EXCHANGE;  Surgeon: JMarchia Bond MD;  Location: [facility]  Service: Orthopedics;  Laterality: Left;  . LEFT HEART CATHETERIZATION WITH CORONARY ANGIOGRAM N/A 01/04/2012   Procedure: LEFT HEART CATHETERIZATION WITH CORONARY ANGIOGRAM;  Surgeon: JLorretta Harp MD;  [facility];  Service: Cardiovascular;  Laterality: N/A;  . PARTIAL KNEE ARTHROPLASTY Left 04/24/2015   Procedure: LEFT UNICOMPARTMENTAL KNEE ARTHROPLASTY ;  Surgeon: Marchia Bond, MD;  Location: [facility];  Service: Orthopedics;  Laterality: Left;  . PARTIAL KNEE ARTHROPLASTY Right 07/02/2015   Procedure: RIGHT UNI KNEE ARTHROPLASTY;  Surgeon: Marchia Bond, MD;  Location: [facility];   Service: Orthopedics;  Laterality: Right;  ANESTHESIA:  GENERAL, PRE/POST OP FEMORAL NERVE  . POSTERIOR CERVICAL FUSION/FORAMINOTOMY  1990  . ROBOTIC ASSISTED TOTAL HYSTERECTOMY WITH BILATERAL SALPINGO OOPHERECTOMY Bilateral 02/17/2015   Procedure: ROBOTIC ASSISTED TOTAL HYSTERECTOMY WITH BILATERAL SALPINGO OOPHORECTOMY;  Surgeon: Everitt Amber, MD;  Location: [facility];  Service: Gynecology;  Laterality: Bilateral;   Social history  reports that she has been smoking Cigarettes.  She has a 25.00 pack-year smoking history. She has never used smokeless tobacco. She reports that she does not drink alcohol or use drugs.  -Lives at home with husband  Allergies  Allergen Reactions  . Penicillins Hives    Childhood reaction, told she had hives    Family History  Problem Relation Age of Onset  . Stroke Mother   . Hypertension Mother   . Diabetes Mother   . Alzheimer's disease Mother   . Diabetes Father   . COPD Father        vent-dependent, MODS  . Hypertension Sister   . Mental illness Sister        borderline personality d/o  . Mental illness Sister        schizoeffective d/o  . Diabetes Sister     Prior to Admission medications   Medication Sig Start Date End Date Taking? Authorizing Provider  ALPRAZolam Duanne Moron) 0.5 MG tablet Take 0.5 mg by mouth 3 (three) times daily as needed for anxiety.  05/07/15  Yes [provider]  atorvastatin (LIPITOR) 20 MG tablet Take 20 mg by mouth daily. 10/12/14  Yes [provider]  buPROPion (WELLBUTRIN SR) 100 MG 12 hr tablet Take 100 mg by mouth every 12 (twelve) hours. 10/06/16  Yes [provider]  escitalopram (LEXAPRO) 20 MG tablet Take 20 mg by mouth daily.    Yes [provider]  gabapentin (NEURONTIN) 300 MG capsule Take 300 mg by mouth 3 (three) times daily. 10/12/14  Yes [provider]  Insulin Human (INSULIN PUMP) SOLN Inject into the skin continuous. Humalog - basal rate 3.4 Units/hr   Yes [provider]  levothyroxine (SYNTHROID, LEVOTHROID) 125 MCG tablet Take 125 mcg by mouth daily. 10/06/16  Yes [provider]  OVER THE COUNTER MEDICATION Pt uses CPAP machine daily.   Yes [provider]  valsartan-hydrochlorothiazide (DIOVAN-HCT) 80-12.5 MG per tablet Take 1 tablet by mouth daily.   Yes [provider]  Vitamin D, Ergocalciferol, (DRISDOL) 50000 units CAPS capsule Take 50,000 Units by mouth every Saturday.  01/01/16  Yes [provider]  Crenshaw test strip  12/19/14   [provider]    Physical Exam: Vitals:   10/06/16 1600 10/06/16 1615 10/06/16 1630 10/06/16 1645  BP: (!) 122/54 124/60 129/60 (!) 126/43  Pulse: 78 77 79 78  Resp:      Temp:      TempSrc:      SpO2: 93% 95% 97% 93%    Constitutional: NAD, calm, comfortable. Looks older than stated age 67:   10/06/16 1600 10/06/16 1615 10/06/16 1630 10/06/16 1645  BP: (!) 122/54 124/60 129/60 (!) 126/43  Pulse: 78 77 79 78  Resp:      Temp:      TempSrc:      SpO2: 93% 95% 97% 93%   Eyes: PERRL, lids and conjunctivae normal ENMT: Mucous membranes are Dry. Posterior pharynx clear of any exudate or lesions. Neck: normal, supple, no masses, no thyromegaly Respiratory: Bilateral decreased breath sounds at bases with some scattered crackles no wheezing. Cardiovascular: S1 and S2 positive, rate controlled. No murmurs Abdomen: no tenderness, no masses palpated. No hepatosplenomegaly. Bowel sounds positive.  Musculoskeletal: no clubbing / cyanosis. 1+ lower extremity edema.  Skin: Skin over the right great toe is erythematous, warm with mild tenderness with no discharge. 2 healing chronic ulcers over the plantar aspect of the right foot with no surrounding erythema  Neurologic: CN 2-12 grossly intact. Sensation intact, DTR normal. Strength 5/5 in all 4.  Psychiatric: Normal judgment and insight. Alert and oriented x 3. Normal mood.    Labs on  Admission: I have personally reviewed following labs and imaging studies  CBC:  Recent Labs Lab 10/06/16 1116  WBC 16.8*  NEUTROABS 15.2*  HGB 11.7*  HCT 35.4*  MCV 95.7  PLT 329   Basic Metabolic Panel:  Recent Labs Lab 10/06/16 1116  NA 132*  K 5.0  CL 98*  CO2 24  GLUCOSE 240*  BUN 18  CREATININE 1.33*  CALCIUM 9.4   GFR: CrCl cannot be calculated (Unknown ideal weight.). Liver Function Tests:  Recent Labs Lab 10/06/16 1116  AST 32  ALT 23  ALKPHOS 121  BILITOT 0.8  PROT 7.0  ALBUMIN 3.4*   No results for input(s): LIPASE, AMYLASE in the last 168 hours. No results for input(s): AMMONIA in the last 168 hours. Coagulation Profile: No results for input(s): INR, PROTIME in the last 168 hours. Cardiac Enzymes: No results for input(s): CKTOTAL, CKMB, CKMBINDEX, TROPONINI in the last 168 hours. BNP (last 3 results) No results for input(s): PROBNP in the last 8760 hours. HbA1C: No results for input(s): HGBA1C in the last 72 hours. CBG: No results for input(s): GLUCAP in the last 168 hours. Lipid Profile: No results for input(s): CHOL, HDL, LDLCALC, TRIG, CHOLHDL, LDLDIRECT in the last 72 hours. Thyroid Function Tests: No results for input(s): TSH, T4TOTAL, FREET4, T3FREE, THYROIDAB in the last 72 hours. Anemia Panel: No results for input(s): VITAMINB12, FOLATE, FERRITIN, TIBC, IRON, RETICCTPCT in the last 72 hours. Urine analysis:    Component Value Date/Time   COLORURINE YELLOW 10/06/2016 1231   APPEARANCEUR CLEAR 10/06/2016 1231   LABSPEC 1.016 10/06/2016 1231   PHURINE 6.0 10/06/2016 1231   GLUCOSEU NEGATIVE 10/06/2016 1231   HGBUR NEGATIVE 10/06/2016 1231   BILIRUBINUR NEGATIVE 10/06/2016 1231   KETONESUR NEGATIVE 10/06/2016 1231   PROTEINUR 30 (A) 10/06/2016 1231   UROBILINOGEN 1.0 11/03/2014 0823   NITRITE NEGATIVE 10/06/2016 1231   LEUKOCYTESUR NEGATIVE 10/06/2016 1231    Radiological Exams on Admission: Dg Chest 2 View  Result Date:  10/06/2016 CLINICAL DATA:  Nausea, vomiting.  Fever. EXAM: CHEST  2 VIEW COMPARISON:  12/30/2011 FINDINGS: Heart and mediastinal contours are within normal limits. No focal opacities or effusions. No acute bony abnormality. Mild peribronchial thickening. IMPRESSION: Mild bronchitic changes. Electronically Signed   By: Rolm Baptise M.D.   On: 10/06/2016 11:47      Assessment/Plan Principal Problem:   Sepsis ([place]) Active Problems:   CAD (coronary artery disease)   Tobacco abuse   OSA on CPAP   HTN (hypertension)   Diabetes mellitus due to underlying condition, uncontrolled (  [place])    Sepsis - Present on admission. Secondary to bronchitis/pneumonia and/or right great toe cellulitis - Patient has already been started on broad-spectrum antibiotics and intravenous fluids. Continue IV fluids at 125 ML's an hour along with Levaquin and vancomycin for now. Follow cultures.  - X-ray of the right foot  Probable acute bacterial bronchitis versus comfort acquired pneumonia - Chest x-ray on admission is negative. Repeat chest x-ray for tomorrow - Follow cultures. Continue Levaquin and vancomycin for now - Urine streptococcal and legionella antigen - Oxygen supplementation if needed  Right great toe bacterial cellulitis - Continue Levaquin and vancomycin for now. Patient is allergic to penicillin. Right foot x-ray. ESR and CRP for tomorrow. - Patient might need MRI of the foot to rule out osteo-myelitis if symptoms don't improve  Leukocytosis - Secondary to above. Repeat a.m. Labs;follow cultures  Chronic feet ulcers - Wound care consult. Outpatient follow-up with podiatry   Diabetes mellitus type 2 - On insulin pump at as an outpatient. - Consult diabetes coordinator. - Accu-Cheks with insulin sliding scale coverage  Tobacco abuse - We will need to counsel about tobacco cessation  Obstructive sleep apnea - Continue CPAP  Hypertension - Monitor blood pressure. Hold home medications  for now  Hypothyroidism - Continue Synthroid  Hyperlipidemia - Continue Lipitor      DVT prophylaxis: Lovenox Code Status: Full Family Communication: None at bedside  Disposition Plan: Home in 2-3 days pending improvement in clinical outcome Consults called: None Admission status: Inpatient telemetry  Severity of Illness: The appropriate patient status for this patient is INPATIENT. Inpatient status is judged to be reasonable and necessary in order to provide the required intensity of service to ensure the patient's safety. The patient's presenting symptoms, physical exam findings, and initial radiographic and laboratory data in the context of their chronic comorbidities is felt to place them at high risk for further clinical deterioration. Furthermore, it is not anticipated that the patient will be medically stable for discharge from the hospital within 2 midnights of admission. The following factors support the patient status of inpatient.   " The patient's presenting symptoms include fever, chills and cough. " The worrisome physical exam findings include tachycardia, tachypnea, right great toe cellulitis " The initial radiographic and laboratory data are worrisome because of leukocytosis, lactic acidosis. " The chronic co-morbidities include diabetes mellitus type 2, chronic foot ulcers  * I certify that at the point of admission it is my clinical judgment that the patient will require inpatient hospital care spanning beyond 2 midnights from the point of admission due to high intensity of service, high risk for further deterioration and high frequency of surveillance required.Aline August MD [facility] Pager [phone]  If 7PM-7AM, please contact night-coverage [email or web address] Password [email or web address]  10/06/2016, 5:06 PM

## 2016-10-06 NOTE — ED Provider Notes (Signed)
[facility] DEPT Provider Note   CSN: [id number] Arrival date & time: 10/06/16  1103     History   Chief Complaint Chief Complaint  Patient presents with  . Nausea  . Chills    HPI [name] is a 61 y.o. female. With history of insulin-dependent diabetes, OSA, bilateral foot ulcers 2/2 neuropathy s/p R 2-3 toe amputation who presents with fever and cough. Patient reports not feeling well for the past 24 hours. Symptoms started last evening. She noted onset of nonproductive cough post tussive emesis, and body aches. Subjective fevers and chills at home. She was seen by [facility] this morning, was told to come to the emergency room due to elevated white count. She denies any headache, neck stiffness, chest pain, abdominal pain,diarrhea, or urinary symptoms. She denies any worsening pain or redness to her chronic foot ulcers.  HPI  Past Medical History:  Diagnosis Date  . Anxiety   . Arthritis    knees  . Coronary artery disease   . Depression   . Fatty liver   . History of nuclear stress test 12/16/2011   exercise myoview; normal images with 2-62mm ST-segment depression - subsequent cath revelaed subtotally occluded small 2nd marginal branch & 75% PDA lesion, normal LV function  . Hypertension   . Hypothyroidism   . Neuropathy    in feet  . OSA on CPAP    AHI = 44 (per patient) setting 13 per pt  . Personal history of kidney stones   . Primary localized osteoarthritis of left knee 04/24/2015  . Primary localized osteoarthritis of right knee 07/02/2015  . Tobacco abuse   . Type 2 diabetes mellitus (HCC)    insulin pump    Patient Active Problem List   Diagnosis Date Noted  . Sepsis ([place]) 10/06/2016  . Diabetes mellitus due to underlying condition, uncontrolled ([place]) 10/06/2016  . Primary localized osteoarthritis of right knee 07/02/2015  . S/P right unicompartmental knee replacement 07/02/2015  . Infection of prosthetic left knee joint ([place]) 05/13/2015  . Primary  localized osteoarthritis of left knee 04/24/2015  . S/P left unicompartmental knee replacement 04/24/2015  . Hyperlipidemia 04/03/2015  . Pelvic mass in female 02/17/2015  . Mass, ovarian 02/09/2015  . CAD (coronary artery disease) 11/05/2012  . Obesity 11/05/2012  . Tobacco abuse 11/05/2012  . OSA on CPAP 11/05/2012  . HTN (hypertension) 11/05/2012  . DM (diabetes mellitus) type 2, uncontrolled, with ketoacidosis ([place]) 11/05/2012    Past Surgical History:  Procedure Laterality Date  . 3rd right toe amputation  02/2016  . AMPUTATION Right 12/22/2014   Procedure: RIGHT 2ND TOE AMPUTATION;  Surgeon: [name], MD;  Location: [place];  Service: Orthopedics;  Laterality: Right;  . BREAST ENHANCEMENT SURGERY Bilateral   . CARDIAC CATHETERIZATION  01/04/2012   subtotally occluded small 2nd marginal branch & 75% PDA lesion, normal LV function  . CATARACT EXTRACTION Bilateral    with lens implants  . CHOLECYSTECTOMY  2008  . COLONOSCOPY    . COLONOSCOPY WITH PROPOFOL N/A 05/12/2016   Procedure: COLONOSCOPY WITH PROPOFOL;  Surgeon: Juanita Craver, MD;  Location: [facility];  Service: Endoscopy;  Laterality: N/A;  . I&D KNEE WITH POLY EXCHANGE Left 05/13/2015   Procedure: IRRIGATION AND DEBRIDEMENT KNEE WITH POLY EXCHANGE;  Surgeon: Marchia Bond, MD;  Location: [facility];  Service: Orthopedics;  Laterality: Left;  . LEFT HEART CATHETERIZATION WITH CORONARY ANGIOGRAM N/A 01/04/2012   Procedure: LEFT HEART CATHETERIZATION WITH CORONARY ANGIOGRAM;  Surgeon: Lorretta Harp,  MD;  Location: [facility];  Service: Cardiovascular;  Laterality: N/A;  . PARTIAL KNEE ARTHROPLASTY Left 04/24/2015   Procedure: LEFT UNICOMPARTMENTAL KNEE ARTHROPLASTY ;  Surgeon: Marchia Bond, MD;  Location: [facility];  Service: Orthopedics;  Laterality: Left;  . PARTIAL KNEE ARTHROPLASTY Right 07/02/2015   Procedure: RIGHT UNI KNEE ARTHROPLASTY;  Surgeon: Marchia Bond, MD;  Location: [facility];   Service: Orthopedics;  Laterality: Right;  ANESTHESIA:  GENERAL, PRE/POST OP FEMORAL NERVE  . POSTERIOR CERVICAL FUSION/FORAMINOTOMY  1990  . ROBOTIC ASSISTED TOTAL HYSTERECTOMY WITH BILATERAL SALPINGO OOPHERECTOMY Bilateral 02/17/2015   Procedure: ROBOTIC ASSISTED TOTAL HYSTERECTOMY WITH BILATERAL SALPINGO OOPHORECTOMY;  Surgeon: Everitt Amber, MD;  Location: [facility];  Service: Gynecology;  Laterality: Bilateral;    OB History    No data available       Home Medications    Prior to Admission medications   Medication Sig Start Date End Date Taking? Authorizing Provider  ALPRAZolam Duanne Moron) 0.5 MG tablet Take 0.5 mg by mouth 3 (three) times daily as needed for anxiety.  05/07/15  Yes [provider]  atorvastatin (LIPITOR) 20 MG tablet Take 20 mg by mouth daily. 10/12/14  Yes [provider]  buPROPion (WELLBUTRIN SR) 100 MG 12 hr tablet Take 100 mg by mouth every 12 (twelve) hours. 10/06/16  Yes [provider]  escitalopram (LEXAPRO) 20 MG tablet Take 20 mg by mouth daily.    Yes [provider]  gabapentin (NEURONTIN) 300 MG capsule Take 300 mg by mouth 3 (three) times daily. 10/12/14  Yes [provider]  Insulin Human (INSULIN PUMP) SOLN Inject into the skin continuous. Humalog - basal rate 3.4 Units/hr   Yes [provider]  levothyroxine (SYNTHROID, LEVOTHROID) 125 MCG tablet Take 125 mcg by mouth daily. 10/06/16  Yes [provider]  OVER THE COUNTER MEDICATION Pt uses CPAP machine daily.   Yes [provider]  valsartan-hydrochlorothiazide (DIOVAN-HCT) 80-12.5 MG per tablet Take 1 tablet by mouth daily.   Yes [provider]  Vitamin D, Ergocalciferol, (DRISDOL) 50000 units CAPS capsule Take 50,000 Units by mouth every Saturday.  01/01/16  Yes [provider]  [place] test strip  12/19/14   [provider]    Family History Family History  Problem Relation Age of Onset  .  Stroke Mother   . Hypertension Mother   . Diabetes Mother   . Alzheimer's disease Mother   . Diabetes Father   . COPD Father        vent-dependent, MODS  . Hypertension Sister   . Mental illness Sister        borderline personality d/o  . Mental illness Sister        schizoeffective d/o  . Diabetes Sister     Social History Social History  Substance Use Topics  . Smoking status: Current Every Day Smoker    Packs/day: 1.00    Years: 25.00    Types: Cigarettes  . Smokeless tobacco: Never Used  . Alcohol use No     Allergies   Penicillins   Review of Systems Review of Systems  Constitutional: Positive for chills and fever.  HENT: Negative for ear pain and sore throat.   Eyes: Negative for pain and visual disturbance.  Respiratory: Positive for cough. Negative for shortness of breath.   Cardiovascular: Negative for chest pain and palpitations.  Gastrointestinal: Positive for vomiting. Negative for abdominal pain, diarrhea and nausea.  Genitourinary: Negative for  difficulty urinating, dysuria, frequency and hematuria.  Musculoskeletal: Negative for arthralgias and back pain.  Skin: Negative for color change and rash.  Neurological: Positive for light-headedness. Negative for seizures and syncope.  All other systems reviewed and are negative.    Physical Exam Updated Vital Signs BP (!) 143/56 (BP Location: Right Arm)   Pulse 87   Temp 99.5 F (37.5 C) (Oral)   Resp 17   Wt 115.7 kg (255 lb)   SpO2 96%   BMI 39.94 kg/m   Physical Exam  Constitutional: She appears well-developed and well-nourished. She appears distressed (mildly).  HENT:  Head: Normocephalic and atraumatic.  Eyes: Conjunctivae are normal.  Neck: Neck supple.  Cardiovascular: Normal rate and regular rhythm.   No murmur heard. Pulmonary/Chest: Effort normal and breath sounds normal. No respiratory distress.  Abdominal: Soft. Bowel sounds are normal. There is no tenderness. There is no rebound  and no guarding.  Musculoskeletal: She exhibits deformity (right great toe with erythema, no warmth, no ttp). She exhibits no edema.  Ulcerations to bilateral feet (stable per patient)  Neurological: She is alert.  Skin: Skin is warm and dry.  Psychiatric: She has a normal mood and affect.  Nursing note and vitals reviewed.    ED Treatments / Results  Labs (all labs ordered are listed, but only abnormal results are displayed) Labs Reviewed  COMPREHENSIVE METABOLIC PANEL - Abnormal; Notable for the following:       Result Value   Sodium 132 (*)    Chloride 98 (*)    Glucose, Bld 240 (*)    Creatinine, Ser 1.33 (*)    Albumin 3.4 (*)    GFR calc non Af Amer 42 (*)    GFR calc Af Amer 49 (*)    All other components within normal limits  CBC WITH DIFFERENTIAL/PLATELET - Abnormal; Notable for the following:    WBC 16.8 (*)    RBC 3.70 (*)    Hemoglobin 11.7 (*)    HCT 35.4 (*)    Neutro Abs 15.2 (*)    All other components within normal limits  URINALYSIS, ROUTINE W REFLEX MICROSCOPIC - Abnormal; Notable for the following:    Protein, ur 30 (*)    Bacteria, UA RARE (*)    Squamous Epithelial / LPF 0-5 (*)    All other components within normal limits  LACTIC ACID, PLASMA - Abnormal; Notable for the following:    Lactic Acid, Venous 3.2 (*)    All other components within normal limits  LACTIC ACID, PLASMA - Abnormal; Notable for the following:    Lactic Acid, Venous 2.5 (*)    All other components within normal limits  GLUCOSE, CAPILLARY - Abnormal; Notable for the following:    Glucose-Capillary 175 (*)    All other components within normal limits  I-STAT CG4 LACTIC ACID, ED - Abnormal; Notable for the following:    Lactic Acid, Venous 3.80 (*)    All other components within normal limits  CULTURE, BLOOD (ROUTINE X 2)  CULTURE, BLOOD (ROUTINE X 2)  URINE CULTURE  LACTIC ACID, PLASMA  HIV ANTIBODY (ROUTINE TESTING)  COMPREHENSIVE METABOLIC PANEL  CBC  HEMOGLOBIN A1C    SEDIMENTATION RATE  C-REACTIVE PROTEIN  I-STAT CG4 LACTIC ACID, ED    EKG  EKG Interpretation None       Radiology Dg Chest 2 View  Result Date: 10/06/2016 CLINICAL DATA:  Nausea, vomiting.  Fever. EXAM: CHEST  2 VIEW COMPARISON:  12/30/2011 FINDINGS: Heart and  mediastinal contours are within normal limits. No focal opacities or effusions. No acute bony abnormality. Mild peribronchial thickening. IMPRESSION: Mild bronchitic changes. Electronically Signed   By: Rolm Baptise M.D.   On: 10/06/2016 11:47   Dg Foot 2 Views Right  Result Date: 10/06/2016 CLINICAL DATA:  61 y/o F; pain and swelling of the right foot. History of diabetes. EXAM: RIGHT FOOT - 2 VIEW COMPARISON:  62718 foot radiographs. FINDINGS: Second digit amputation across the proximal interphalangeal joint and third digit amputation across the metatarsophalangeal joint. Chronic fracture of the left fourth metatarsal. Hypertrophy of first metatarsal head. Mild first metatarsophalangeal joint osteoarthrosis. Moderate-sized plantar calcaneal enthesophyte. Osteoarthrosis of the intertarsal joints with productive changes. No acute osseous abnormality identified. IMPRESSION: No acute osseous abnormality identified. Stable chronic findings as above. Electronically Signed   By: Kristine Garbe M.D.   On: 10/06/2016 20:54    Procedures Procedures (including critical care time)  Medications Ordered in ED Medications  buPROPion [facility] SR) 12 hr tablet 100 mg (100 mg Oral Given 10/06/16 2237)  levothyroxine (SYNTHROID, LEVOTHROID) tablet 125 mcg (not administered)  Vitamin D (Ergocalciferol) (DRISDOL) capsule 50,000 Units (not administered)  ALPRAZolam (XANAX) tablet 0.5 mg (not administered)  atorvastatin (LIPITOR) tablet 20 mg (20 mg Oral Given 10/06/16 2048)  gabapentin (NEURONTIN) capsule 300 mg (300 mg Oral Given 10/06/16 2237)  escitalopram (LEXAPRO) tablet 20 mg (20 mg Oral Given 10/06/16 2049)  enoxaparin  (LOVENOX) injection 40 mg (40 mg Subcutaneous Given 10/06/16 2048)  0.9 %  sodium chloride infusion ( Intravenous New Bag/Given 10/06/16 2048)  acetaminophen (TYLENOL) tablet 650 mg (not administered)    Or  acetaminophen (TYLENOL) suppository 650 mg (not administered)  traMADol (ULTRAM) tablet 50 mg (not administered)  ondansetron (ZOFRAN) tablet 4 mg (not administered)    Or  ondansetron (ZOFRAN) injection 4 mg (not administered)  senna-docusate (Senokot-S) tablet 1 tablet (not administered)  insulin aspart (novoLOG) injection 0-20 Units (not administered)  insulin aspart (novoLOG) injection 0-5 Units (0 Units Subcutaneous Not Given 10/06/16 2130)  vancomycin (VANCOCIN) 1,250 mg in sodium chloride 0.9 % 250 mL IVPB (not administered)  levofloxacin (LEVAQUIN) IVPB 750 mg (not administered)  insulin pump ( Subcutaneous Duplicate 08/14/74 2831)  acetaminophen (TYLENOL) tablet 650 mg (650 mg Oral Given 10/06/16 1120)  sodium chloride 0.9 % bolus 1,000 mL (0 mLs Intravenous Stopped 10/06/16 1555)    And  sodium chloride 0.9 % bolus 1,000 mL (0 mLs Intravenous Stopped 10/06/16 1645)    And  sodium chloride 0.9 % bolus 1,000 mL (0 mLs Intravenous Stopped 10/06/16 1903)  levofloxacin (LEVAQUIN) IVPB 750 mg (0 mg Intravenous Stopped 10/06/16 1742)  vancomycin (VANCOCIN) IVPB 1000 mg/200 mL premix (0 mg Intravenous Stopped 10/06/16 1648)  vancomycin (VANCOCIN) 1,500 mg in sodium chloride 0.9 % 500 mL IVPB (0 mg Intravenous Stopped 10/06/16 2052)     Initial Impression / Assessment and Plan / ED Course  I have reviewed the triage vital signs and the nursing notes.  Pertinent labs & imaging results that were available during my care of the patient were reviewed by me and considered in my medical decision making (see chart for details).     Patient is a 61 y/o female with history of poorly controlled DM, neuropathy, obestity, OSA who presents with fever and cough for one day. Patient arrived ill  appearing, febrile to 102.6, tachypenic, tachycardic to 108, and mildly hypertensive. Exam as above - significant for minimal erythema to right great toe. Lungs clear to auscultation,  coughing on exam.  Code sepsis initiated. Concern for pneumonia given borderline oxygenation on room air and coughing history. Treated with levoquin and vancomycin (due to PCN allergy). Vomiting described as post-tussive with benign abdominal exam. Considered cellulitis as source, but minimal redness on great toe. Would continue to monitor closely.  Discussed with hospitalist for admission. Patient in agreement with plan at time of admission.   Patient and plan of care discussed with Attending physician, Dr. Laverta Baltimore.    Final Clinical Impressions(s) / ED Diagnoses   Final diagnoses:  Community acquired pneumonia, unspecified laterality  Sepsis, due to unspecified organism [facility])    New Prescriptions Current Discharge Medication List       Arnetha Massy, MD 10/07/16 Obie Dredge, MD 10/07/16 [phone]

## 2016-10-06 NOTE — Progress Notes (Signed)
CRITICAL VALUE ALERT  Critical Value:  Lactic Acid  2.5  Date & Time Notied:  10/06/2016 2121  Provider Notified: X. Blount   Orders Received/Actions taken: no new orders

## 2016-10-06 NOTE — ED Notes (Signed)
Patient is on a insulin pump at 3.4 Units/Hr  Current glucose level is 240 per CMP

## 2016-10-07 ENCOUNTER — Inpatient Hospital Stay (HOSPITAL_COMMUNITY): Payer: BLUE CROSS/BLUE SHIELD

## 2016-10-07 LAB — GLUCOSE, CAPILLARY
GLUCOSE-CAPILLARY: 249 mg/dL — AB (ref 65–99)
Glucose-Capillary: 259 mg/dL — ABNORMAL HIGH (ref 65–99)
Glucose-Capillary: 264 mg/dL — ABNORMAL HIGH (ref 65–99)
Glucose-Capillary: 309 mg/dL — ABNORMAL HIGH (ref 65–99)

## 2016-10-07 LAB — COMPREHENSIVE METABOLIC PANEL WITH GFR
ALT: 27 U/L (ref 14–54)
AST: 29 U/L (ref 15–41)
Albumin: 2.9 g/dL — ABNORMAL LOW (ref 3.5–5.0)
Alkaline Phosphatase: 106 U/L (ref 38–126)
Anion gap: 10 (ref 5–15)
BUN: 12 mg/dL (ref 6–20)
CO2: 19 mmol/L — ABNORMAL LOW (ref 22–32)
Calcium: 8.6 mg/dL — ABNORMAL LOW (ref 8.9–10.3)
Chloride: 102 mmol/L (ref 101–111)
Creatinine, Ser: 0.97 mg/dL (ref 0.44–1.00)
GFR calc Af Amer: >60 mL/min (ref >60)
GFR calc non Af Amer: >60 mL/min (ref >60)
Glucose, Bld: 259 mg/dL — ABNORMAL HIGH (ref 65–99)
Potassium: 4.3 mmol/L (ref 3.5–5.1)
Sodium: 131 mmol/L — ABNORMAL LOW (ref 135–145)
Total Bilirubin: 1.2 mg/dL (ref 0.3–1.2)
Total Protein: 6.4 g/dL — ABNORMAL LOW (ref 6.5–8.1)

## 2016-10-07 LAB — HEMOGLOBIN A1C
Hgb A1c MFr Bld: 8.4 % — ABNORMAL HIGH (ref 4.8–5.6)
Mean Plasma Glucose: 194.38 mg/dL

## 2016-10-07 LAB — CBC
HEMATOCRIT: 35.3 % — AB (ref 36.0–46.0)
HEMOGLOBIN: 12 g/dL (ref 12.0–15.0)
MCH: 32 pg (ref 26.0–34.0)
MCHC: 34 g/dL (ref 30.0–36.0)
MCV: 94.1 fL (ref 78.0–100.0)
Platelets: 169 10*3/uL (ref 150–400)
RBC: 3.75 MIL/uL — ABNORMAL LOW (ref 3.87–5.11)
RDW: 14.1 % (ref 11.5–15.5)
WBC: 6.6 10*3/uL (ref 4.0–10.5)

## 2016-10-07 LAB — SEDIMENTATION RATE: Sed Rate: 32 mm/hr — ABNORMAL HIGH (ref 0–22)

## 2016-10-07 LAB — C-REACTIVE PROTEIN: CRP: 9.6 mg/dL — ABNORMAL HIGH (ref <1.0)

## 2016-10-07 LAB — LACTIC ACID, PLASMA: LACTIC ACID, VENOUS: 1.4 mmol/L (ref 0.5–1.9)

## 2016-10-07 LAB — HIV ANTIBODY (ROUTINE TESTING W REFLEX): HIV Screen 4th Generation wRfx: NONREACTIVE

## 2016-10-07 MED ORDER — IRBESARTAN 75 MG PO TABS
75.0000 mg | ORAL_TABLET | Freq: Every day | ORAL | Status: DC
  Administered 2016-10-07 – 2016-10-08 (×2): 75 mg via ORAL
  Filled 2016-10-07 (×2): qty 1

## 2016-10-07 MED ORDER — ALUM & MAG HYDROXIDE-SIMETH 200-200-20 MG/5ML PO SUSP
15.0000 mL | ORAL | Status: DC | PRN
  Administered 2016-10-07 – 2016-10-08 (×2): 15 mL via ORAL
  Filled 2016-10-07 (×2): qty 30

## 2016-10-07 MED ORDER — VANCOMYCIN HCL IN DEXTROSE 750-5 MG/150ML-% IV SOLN
750.0000 mg | Freq: Two times a day (BID) | INTRAVENOUS | Status: DC
  Administered 2016-10-07 – 2016-10-08 (×2): 750 mg via INTRAVENOUS
  Filled 2016-10-07 (×4): qty 150

## 2016-10-07 MED ORDER — HYDRALAZINE HCL 20 MG/ML IJ SOLN
5.0000 mg | Freq: Four times a day (QID) | INTRAMUSCULAR | Status: DC | PRN
  Administered 2016-10-07: 5 mg via INTRAVENOUS
  Filled 2016-10-07: qty 1

## 2016-10-07 MED ORDER — INSULIN ASPART 100 UNIT/ML ~~LOC~~ SOLN
5.0000 [IU] | Freq: Three times a day (TID) | SUBCUTANEOUS | Status: DC
  Administered 2016-10-07: 5 [IU] via SUBCUTANEOUS

## 2016-10-07 MED ORDER — HYDROCHLOROTHIAZIDE 12.5 MG PO CAPS
12.5000 mg | ORAL_CAPSULE | Freq: Every day | ORAL | Status: DC
  Administered 2016-10-07 – 2016-10-08 (×2): 12.5 mg via ORAL
  Filled 2016-10-07 (×2): qty 1

## 2016-10-07 MED ORDER — INSULIN PUMP
SUBCUTANEOUS | Status: DC
  Administered 2016-10-07: 3.2 via SUBCUTANEOUS
  Administered 2016-10-08: 6 via SUBCUTANEOUS
  Administered 2016-10-08: 7.5 via SUBCUTANEOUS
  Filled 2016-10-07: qty 1

## 2016-10-07 MED ORDER — VALSARTAN-HYDROCHLOROTHIAZIDE 80-12.5 MG PO TABS: 1.0000 | ORAL_TABLET | Freq: Every day | ORAL | Status: DC

## 2016-10-07 MED ORDER — INSULIN DETEMIR 100 UNIT/ML ~~LOC~~ SOLN
10.0000 [IU] | Freq: Two times a day (BID) | SUBCUTANEOUS | Status: DC
  Administered 2016-10-07: 10 [IU] via SUBCUTANEOUS
  Filled 2016-10-07 (×2): qty 0.1

## 2016-10-07 NOTE — Care Management Note (Signed)
Case Management Note  Patient Details  Name: [name] MRN: [id number] Date of Birth: 1956-01-03  Subjective/Objective:          Admitted with Sepsis.         Staysha Truby (Spouse)     [phone]      [id number] Reynaldo Minium   Action/Plan: CM received consult for home health needs..... PT evaluation pending, CM to fu with disposition needs.  Expected Discharge Date:                  Expected Discharge Plan:  Home/Self Care  In-House Referral:     Discharge planning Services  CM Consult  Post Acute Care Choice:    Choice offered to:     DME Arranged:    DME Agency:     HH Arranged:    HH Agency:     Status of Service:  In process, will continue to follow  If discussed at Long Length of Stay Meetings, dates discussed:    Additional Comments:  Sharin Mons, RN 10/07/2016, 9:05 AM

## 2016-10-07 NOTE — Progress Notes (Signed)
Patient ID: [name], female   DOB: 1955-04-16, 61 y.o.   MRN: [id number]  PROGRESS NOTE    [name]  XTK:[id number] DOB: Oct 21, 1955 DOA: 10/06/2016 PCP: Burnard Bunting, MD   Brief Narrative:  61 year old female with history of diabetes mellitus type 2 on insulin pump, obstructive sleep apnea, chronic bilateral foot ulcers secondary to neuropathy status post right third toe amputation in January 2018, hypertension, hyperlipidemia,, coronary artery disease, osteoarthritis and hypothyroidism presented with fever and chills. She was admitted with sepsis secondary to bronchitis/pneumonia and right great toe cellulitis.  Assessment & Plan:   Principal Problem:   Sepsis ([place]) Active Problems:   CAD (coronary artery disease)   Tobacco abuse   OSA on CPAP   HTN (hypertension)   Diabetes mellitus due to underlying condition, uncontrolled ([place])   Sepsis - Present on admission. Secondary to bronchitis/pneumonia and/or right great toe cellulitis - Continue IV Levaquin and vancomycin for now. Follow cultures. Discontinue IV fluids.  - X-ray of the right foot showed no acute osseous abnormality  Probable acute bacterial bronchitis versus comfort acquired pneumonia - Chest x-ray on admission is negative.  - Follow cultures. Continue Levaquin and vancomycin for now - Oxygen supplementation if needed  Right great toe bacterial cellulitis - Continue Levaquin and vancomycin for now. Patient is allergic to penicillin.  - Cellulitis improving. We will hold off on MRI at this time.  Leukocytosis - Secondary to above. Resolved. Continue antibiotics   Chronic feet ulcers - Wound care consult. Outpatient follow-up with podiatry   Diabetes mellitus type 2 - On insulin pump at as an outpatient. Insulin pump was not functioning currently. - Consult diabetes coordinator. - Accu-Cheks with insulin sliding scale coverage. Start long-acting and short-acting insulin with  meals  Tobacco abuse - We will need to counsel about tobacco cessation  Obstructive sleep apnea - Continue CPAP  Hypertension - Monitor blood pressure. Restart home blood pressure medications  Hypothyroidism - Continue Synthroid  Hyperlipidemia - Continue Lipitor      DVT prophylaxis: Lovenox Code Status: Full Family Communication: None at bedside  Disposition Plan: Home in 1-3 days pending improvement in clinical outcome Consultants: None Procedures: None   Antimicrobials: Levaquin and vancomycin from 10/06/2016   Subjective: Patient seen and examined at bedside. She feels better. No overnight fever or vomiting. She is still coughing.  Objective: Vitals:   10/06/16 1915 10/06/16 1954 10/07/16 0552 10/07/16 0642  BP: 132/66 (!) 143/56 (!) 163/63   Pulse: 77 87 87   Resp:   18   Temp:  99.5 F (37.5 C) 98.8 F (37.1 C)   TempSrc:  Oral Oral   SpO2: 98% 96% 94%   Weight:    119.1 kg (262 lb 9.6 oz)  Height:    5\' 7"  (1.702 m)    Intake/Output Summary (Last 24 hours) at 10/07/16 1128 Last data filed at 10/07/16 0955  Gross per 24 hour  Intake          1007.42 ml  Output              300 ml  Net           707.42 ml   Filed Weights   10/06/16 1800 10/07/16 0642  Weight: 115.7 kg (255 lb) 119.1 kg (262 lb 9.6 oz)    Examination:  General exam: Appears calm and comfortable  Respiratory system: Bilateral decreased breath sound at bases With scattered crackles Cardiovascular system: S1 & S2 heard, rate controlled.  Gastrointestinal  system: Abdomen is nondistended, soft and nontender. Normal bowel sounds heard. Central nervous system: Alert and oriented. No focal neurological deficits. Moving extremities Extremities: No cyanosis, clubbing; 1+ lower extremity edema Skin: Skin over the right great toe is erythematous but lesser compared to last evening with slight warmth but no discharge. 2 healing chronic ulcers over the plantar aspect of the right  foot with no surrounding erythema.  Psychiatry: Judgement and insight appear normal. Mood & affect appropriate.     Data Reviewed: I have personally reviewed following labs and imaging studies  CBC:  Recent Labs Lab 10/06/16 1116 10/07/16 0523  WBC 16.8* 6.6  NEUTROABS 15.2*  --   HGB 11.7* 12.0  HCT 35.4* 35.3*  MCV 95.7 94.1  PLT 187 270   Basic Metabolic Panel:  Recent Labs Lab 10/06/16 1116 10/07/16 0523  NA 132* 131*  K 5.0 4.3  CL 98* 102  CO2 24 19*  GLUCOSE 240* 259*  BUN 18 12  CREATININE 1.33* 0.97  CALCIUM 9.4 8.6*   GFR: Estimated Creatinine Clearance: 80.3 mL/min (by C-G formula based on SCr of 0.97 mg/dL). Liver Function Tests:  Recent Labs Lab 10/06/16 1116 10/07/16 0523  AST 32 29  ALT 23 27  ALKPHOS 121 106  BILITOT 0.8 1.2  PROT 7.0 6.4*  ALBUMIN 3.4* 2.9*   No results for input(s): LIPASE, AMYLASE in the last 168 hours. No results for input(s): AMMONIA in the last 168 hours. Coagulation Profile: No results for input(s): INR, PROTIME in the last 168 hours. Cardiac Enzymes: No results for input(s): CKTOTAL, CKMB, CKMBINDEX, TROPONINI in the last 168 hours. BNP (last 3 results) No results for input(s): PROBNP in the last 8760 hours. HbA1C:  Recent Labs  10/07/16 0523  HGBA1C 8.4*   CBG:  Recent Labs Lab 10/06/16 2009 10/07/16 0801  GLUCAP 175* 259*   Lipid Profile: No results for input(s): CHOL, HDL, LDLCALC, TRIG, CHOLHDL, LDLDIRECT in the last 72 hours. Thyroid Function Tests: No results for input(s): TSH, T4TOTAL, FREET4, T3FREE, THYROIDAB in the last 72 hours. Anemia Panel: No results for input(s): VITAMINB12, FOLATE, FERRITIN, TIBC, IRON, RETICCTPCT in the last 72 hours. Sepsis Labs:  Recent Labs Lab 10/06/16 1447 10/06/16 1647 10/06/16 2023 10/06/16 2321  LATICACIDVEN 3.80* 3.2* 2.5* 1.4    No results found for this or any previous visit (from the past 240 hour(s)).       Radiology Studies: Dg Chest  2 View  Result Date: 10/07/2016 CLINICAL DATA:  Shortness of breath, fever EXAM: CHEST  2 VIEW COMPARISON:  10/06/2016 FINDINGS: Heart is upper limits normal in size. Mild peribronchial thickening and interstitial prominence. No effusions. No confluent opacities. No acute bony abnormality. IMPRESSION: Bronchitic changes, stable. Electronically Signed   By: Rolm Baptise M.D.   On: 10/07/2016 09:09   Dg Chest 2 View  Result Date: 10/06/2016 CLINICAL DATA:  Nausea, vomiting.  Fever. EXAM: CHEST  2 VIEW COMPARISON:  12/30/2011 FINDINGS: Heart and mediastinal contours are within normal limits. No focal opacities or effusions. No acute bony abnormality. Mild peribronchial thickening. IMPRESSION: Mild bronchitic changes. Electronically Signed   By: Rolm Baptise M.D.   On: 10/06/2016 11:47   Dg Foot 2 Views Right  Result Date: 10/06/2016 CLINICAL DATA:  62 y/o F; pain and swelling of the right foot. History of diabetes. EXAM: RIGHT FOOT - 2 VIEW COMPARISON:  62718 foot radiographs. FINDINGS: Second digit amputation across the proximal interphalangeal joint and third digit amputation across the metatarsophalangeal joint.  Chronic fracture of the left fourth metatarsal. Hypertrophy of first metatarsal head. Mild first metatarsophalangeal joint osteoarthrosis. Moderate-sized plantar calcaneal enthesophyte. Osteoarthrosis of the intertarsal joints with productive changes. No acute osseous abnormality identified. IMPRESSION: No acute osseous abnormality identified. Stable chronic findings as above. Electronically Signed   By: Kristine Garbe M.D.   On: 10/06/2016 20:54        Scheduled Meds: . atorvastatin  20 mg Oral Daily  . buPROPion  100 mg Oral Q12H  . enoxaparin (LOVENOX) injection  40 mg Subcutaneous Q24H  . escitalopram  20 mg Oral Daily  . gabapentin  300 mg Oral TID  . hydrochlorothiazide  12.5 mg Oral Daily  . insulin aspart  0-20 Units Subcutaneous TID WC  . insulin aspart  0-5 Units  Subcutaneous QHS  . insulin pump   Subcutaneous Q4H  . irbesartan  75 mg Oral Daily  . levothyroxine  125 mcg Oral QAC breakfast  . [START ON 10/08/2016] Vitamin D (Ergocalciferol)  50,000 Units Oral Q Sat   Continuous Infusions: . sodium chloride 125 mL/hr at 10/07/16 0454  . levofloxacin (LEVAQUIN) IV    . vancomycin       LOS: 1 day        Aline August, MD [facility] Pager [phone]  If 7PM-7AM, please contact night-coverage [email or web address] Password [facility] 10/07/2016, 11:28 AM

## 2016-10-07 NOTE — Consult Note (Addendum)
[facility] Nurse wound consult note Reason for Consult: Consult requested for right foot cellulitis.  Pt states she has been followed by a podiatrist as an outpatient who filed her callous areas. Wound type: Right plantar foot and great toe with dry slightly raised callous areas; no fluctuance or drainage. Periwound: Erythremia surrounding to 3 cm around callous locations. Dressing procedure/placement/frequency: There are no open wounds which require topical treatment at this time.  She is on systemic antibiotic coverage for the cellulitis. Pt should resume follow-up with her podiatrist after discharge; since callous areas are high risk to crack and evolve into full thickness wounds. Please re-consult if further assistance is needed.  Thank-you,  Julien Girt MSN, [place], [place], [place], [place]

## 2016-10-07 NOTE — Progress Notes (Signed)
   10/07/16 2000  Vitals  BP (!) 176/79  BP Location Right Arm  BP Method Automatic  Patient Position (if appropriate) Lying  Pulse Rate 82  Recheck of pt's BP after hydralazine given, will cont. To monitor and pass onto night shift nurse.

## 2016-10-07 NOTE — Progress Notes (Signed)
Notified the MD on call regarding the malfunction with the insulin pump. Dr.  Kennon Holter stated to continue accu-checks with insulin sliding coverage ACHS.  Will continue to monitor the patient

## 2016-10-07 NOTE — Evaluation (Signed)
Physical Therapy Evaluation Patient Details Name: [name] MRN: [id number] DOB: [birth year] Today's Date: 10/07/2016   History of Present Illness  Pt is a 61 y/o female admitted secondary to fever and chills found to be septic. PMH including but not limited to CAD, DM, HTN, neuropathy, 2nd R toe amputation in 2016 and 3rd R toe amputation in 02/2016.  Clinical Impression  Pt presented supine in bed with HOB elevated, awake and willing to participate in therapy session. Prior to admission, pt reported that she was independent with all functional mobility and ADLs. Pt ambulated within her room and in hallway with supervision without use of an AD. Pt with increased DOE and required standing rest breaks secondary to fatigue. Pt ambulated on RA with SPO2 maintaining in mid 90's. PT will continue to follow pt acutely for mobility progression and to ensure a safe d/c home.    Follow Up Recommendations No PT follow up    Equipment Recommendations  None recommended by PT    Recommendations for Other Services       Precautions / Restrictions Precautions Precautions: None Restrictions Weight Bearing Restrictions: No      Mobility  Bed Mobility Overal bed mobility: Independent                Transfers Overall transfer level: Independent                  Ambulation/Gait Ambulation/Gait assistance: Supervision Ambulation Distance (Feet): 100 Feet (100' x2 with standing rest break secondary to DOE) Assistive device: None Gait Pattern/deviations: Step-through pattern;Decreased stride length;Decreased step length - right;Decreased step length - left Gait velocity: decreased Gait velocity interpretation: Below normal speed for age/gender General Gait Details: increased lateral sway with gait, modest instability but no overt LOB or need for physical assistance, supervision for safety  Stairs            Wheelchair Mobility    Modified Rankin (Stroke Patients  Only)       Balance                                             Pertinent Vitals/Pain Pain Assessment: 0-10 Pain Score: 3  Pain Location: Neck Pain Descriptors / Indicators: Aching;Constant;Discomfort Pain Intervention(s): Limited activity within patient's tolerance;Monitored during session;Repositioned    Home Living Family/patient expects to be discharged to:: Private residence Living Arrangements: Spouse/significant other Available Help at Discharge: Family;Available 24 hours/day Type of Home: House Home Access: Stairs to enter Entrance Stairs-Rails: Can reach both Entrance Stairs-Number of Steps: 3 Home Layout: One level Home Equipment: None      Prior Function Level of Independence: Independent               Hand Dominance        Extremity/Trunk Assessment   Upper Extremity Assessment Upper Extremity Assessment: Overall WFL for tasks assessed    Lower Extremity Assessment Lower Extremity Assessment: Overall WFL for tasks assessed       Communication   Communication: No difficulties  Cognition Arousal/Alertness: Awake/alert Behavior During Therapy: WFL for tasks assessed/performed Overall Cognitive Status: Within Functional Limits for tasks assessed                                        General Comments General  comments (skin integrity, edema, etc.): Pt on RA with SPO2 in mid 90's throughout    Exercises     Assessment/Plan    PT Assessment Patient needs continued PT services  PT Problem List Decreased activity tolerance;Decreased balance;Decreased mobility;Decreased coordination;Decreased knowledge of use of DME;Decreased safety awareness;Decreased knowledge of precautions       PT Treatment Interventions DME instruction;Gait training;Stair training;Functional mobility training;Therapeutic activities;Therapeutic exercise;Balance training;Neuromuscular re-education;Patient/family education    PT Goals  (Current goals can be found in the Care Plan section)  Acute Rehab PT Goals Patient Stated Goal: to feel better, return home PT Goal Formulation: With patient Time For Goal Achievement: 10/21/16 Potential to Achieve Goals: Good    Frequency Min 3X/week   Barriers to discharge        Co-evaluation               AM-PAC PT "6 Clicks" Daily Activity  Outcome Measure Difficulty turning over in bed (including adjusting bedclothes, sheets and blankets)?: None Difficulty moving from lying on back to sitting on the side of the bed? : None Difficulty sitting down on and standing up from a chair with arms (e.g., wheelchair, bedside commode, etc,.)?: None Help needed moving to and from a bed to chair (including a wheelchair)?: A Little Help needed walking in hospital room?: A Little Help needed climbing 3-5 steps with a railing? : A Little 6 Click Score: 21    End of Session   Activity Tolerance: Patient limited by fatigue;Other (comment) (pt limited secondary to nausea) Patient left: in chair;with call bell/phone within reach;with family/visitor present Nurse Communication: Mobility status PT Visit Diagnosis: Other abnormalities of gait and mobility (R26.89)    Time: 1510-1535 PT Time Calculation (min) (ACUTE ONLY): 25 min   Charges:   PT Evaluation $PT Eval Moderate Complexity: 1 Mod PT Treatments $Gait Training: 8-22 mins   PT G Codes:        [place], PT, DPT [place] 10/07/2016, 3:48 PM

## 2016-10-07 NOTE — Progress Notes (Signed)
pt's pump shows current rate of 3.2, not 3.4. Pt states she mistakenly told md 3.4 u/hr for basal rate setting and that her setting has always been 3.2u/hr. MD Alekh aware and will be changing setting in [facility].

## 2016-10-07 NOTE — Progress Notes (Signed)
Patient arrived to the unit via bed from the emergency department.  Patient is alert and oriented x 4.  Ambulatory.  No complaints to pain. IV intact to the left forearm.  Insulin pump to the left lower quadrant.  Patient third toe on the right foot amputated.  Two healing ulcers on the plantar aspect of the right foot.   Vital signs  Temp: 99.5; BP 143/56; HR :87 and 96% on room air.  Educated the patient on how to reach the staff on the unit.  Lowered the bed and placed the call light within reach.

## 2016-10-07 NOTE — Progress Notes (Addendum)
Inpatient Diabetes Program Recommendations  AACE/ADA: New Consensus Statement on Inpatient Glycemic Control (2015)  Target Ranges:  Prepandial:   less than 140 mg/dL      Peak postprandial:   less than 180 mg/dL (1-2 hours)      Critically ill patients:  140 - 180 mg/dL   Lab Results  Component Value Date   GLUCAP 249 (H) 10/07/2016   HGBA1C 8.4 (H) 10/07/2016    Review of Glycemic Control  Diabetes history: DM2 Outpatient Diabetes medications: insulin pump Current orders for Inpatient glycemic control: Levemir 10 bid + Novolog 5 tid meal coverage + Novolog correction 0-20 units tid + 0-5 units hs  Inpatient Diabetes Program Recommendations:  Attempted to speak to patient @ bedside.Patient actively heaving nauseated & vomiting. Nurse assisting patient. 3:15 Spoke with patient concerning insulin pump. States her spouse was not able to bring by today. Was able to attain [place] insertion set and patient is familiar with set. Patient now has supplies to restart insulin pump. Spoke with Dr. Starla Link regarding patient now has supplies.  Thank you, Nani Gasser. Tamyah Cutbirth, RN, MSN, CDE  Diabetes Coordinator Inpatient Glycemic Control Team Team Pager [phone] (8am-5pm) 10/07/2016 1:44 PM

## 2016-10-07 NOTE — Progress Notes (Signed)
Pharmacy Antibiotic Note  [name] is a 61 y.o. female who presented on 8/16 with nausea and chills.The patient is noted to have chronic B/L foot ulcers. Pharmacy has been consulted to start Vancomycin + Levaquin for r/o sepsis. SCr 0.97, nCrCl~66 ml/min (normalized) SCr has trended down, adjusting vancomycin dose.   Plan: 1. Adjust vancomycin to 750 mg q 12 hours 2. Levaquin 750 mg IV every 24 hours 3. Will continue to follow renal function, culture results, LOT, and antibiotic de-escalation plans   Height: 5\' 7"  (170.2 cm) Weight: 262 lb 9.6 oz (119.1 kg) IBW/kg (Calculated) : 61.6  Temp (24hrs), Avg:98.9 F (37.2 C), Min:98.2 F (36.8 C), Max:99.5 F (37.5 C)   Recent Labs Lab 10/06/16 1116 10/06/16 1447 10/06/16 1647 10/06/16 2023 10/06/16 2321 10/07/16 0523  WBC 16.8*  --   --   --   --  6.6  CREATININE 1.33*  --   --   --   --  0.97  LATICACIDVEN  --  3.80* 3.2* 2.5* 1.4  --     Estimated Creatinine Clearance: 80.3 mL/min (by C-G formula based on SCr of 0.97 mg/dL).    Allergies  Allergen Reactions  . Penicillins Hives    Childhood reaction, told she had hives    Antimicrobials this admission: Vanc 8/16 >> Levaquin 8/16 >>  Dose adjustments this admission: n/a  Microbiology results: 8/16 BCx >> 8/16 UCx >>   Thank you for allowing [place] to participate in this patients care.  Jens Som, PharmD Clinical phone for 10/07/2016 from 7a-3:30p: [phone] If after 3:30p, please call main pharmacy at: [phone] 10/07/2016 1:45 PM

## 2016-10-07 NOTE — Plan of Care (Signed)
Problem: Safety: Goal: Ability to remain free from injury will improve Outcome: Progressing Pt will be free from falls and injuries during this hospitalization.  Problem: Physical Regulation: Goal: Signs and symptoms of infection will decrease Outcome: Progressing Pt will be free from signs and symptoms of infection prior to discharge.  Problem: Respiratory: Goal: Ability to maintain adequate ventilation will improve Outcome: Progressing Pt will be free from shortness of breath prior to disharge.

## 2016-10-07 NOTE — Progress Notes (Signed)
   10/07/16 1644  Vitals  BP (!) 190/78  MAP (mmHg) 106  BP Method Automatic  Pulse Rate (!) 101  Hydralazine given, will cont. To monitor pt's bp.

## 2016-10-08 LAB — CBC WITH DIFFERENTIAL/PLATELET
BASOS ABS: 0 10*3/uL (ref 0.0–0.1)
Basophils Relative: 0 %
EOS ABS: 0.1 10*3/uL (ref 0.0–0.7)
EOS PCT: 2 %
HCT: 35.5 % — ABNORMAL LOW (ref 36.0–46.0)
Hemoglobin: 12.3 g/dL (ref 12.0–15.0)
Lymphocytes Relative: 28 %
Lymphs Abs: 2 10*3/uL (ref 0.7–4.0)
MCH: 31.1 pg (ref 26.0–34.0)
MCHC: 34.6 g/dL (ref 30.0–36.0)
MCV: 89.9 fL (ref 78.0–100.0)
Monocytes Absolute: 0.5 10*3/uL (ref 0.1–1.0)
Monocytes Relative: 7 %
Neutro Abs: 4.6 10*3/uL (ref 1.7–7.7)
Neutrophils Relative %: 63 %
PLATELETS: 170 10*3/uL (ref 150–400)
RBC: 3.95 MIL/uL (ref 3.87–5.11)
RDW: 13.3 % (ref 11.5–15.5)
WBC: 7.2 10*3/uL (ref 4.0–10.5)

## 2016-10-08 LAB — URINE CULTURE

## 2016-10-08 LAB — BASIC METABOLIC PANEL
ANION GAP: 8 (ref 5–15)
BUN: 11 mg/dL (ref 6–20)
CALCIUM: 9.2 mg/dL (ref 8.9–10.3)
CO2: 26 mmol/L (ref 22–32)
Chloride: 96 mmol/L — ABNORMAL LOW (ref 101–111)
Creatinine, Ser: 0.85 mg/dL (ref 0.44–1.00)
GFR calc Af Amer: >60 mL/min (ref >60)
Glucose, Bld: 123 mg/dL — ABNORMAL HIGH (ref 65–99)
POTASSIUM: 3.5 mmol/L (ref 3.5–5.1)
SODIUM: 130 mmol/L — AB (ref 135–145)

## 2016-10-08 LAB — GLUCOSE, CAPILLARY
GLUCOSE-CAPILLARY: 233 mg/dL — AB (ref 65–99)
Glucose-Capillary: 128 mg/dL — ABNORMAL HIGH (ref 65–99)

## 2016-10-08 LAB — MAGNESIUM: MAGNESIUM: 2 mg/dL (ref 1.7–2.4)

## 2016-10-08 LAB — URIC ACID: Uric Acid, Serum: 6 mg/dL (ref 2.3–6.6)

## 2016-10-08 LAB — C-REACTIVE PROTEIN: CRP: 9 mg/dL — ABNORMAL HIGH (ref <1.0)

## 2016-10-08 MED ORDER — LEVOFLOXACIN 750 MG PO TABS: 750.0000 mg | ORAL_TABLET | Freq: Every day | ORAL | 0 refills | Status: AC

## 2016-10-08 MED ORDER — INSULIN PUMP: 1.0000 | SUBCUTANEOUS | Status: AC

## 2016-10-08 MED ORDER — ONDANSETRON HCL 4 MG PO TABS: 4.0000 mg | ORAL_TABLET | Freq: Four times a day (QID) | ORAL | 0 refills | Status: DC | PRN

## 2016-10-08 MED ORDER — ALUM & MAG HYDROXIDE-SIMETH 200-200-20 MG/5ML PO SUSP: 15.0000 mL | ORAL | 0 refills | Status: DC | PRN

## 2016-10-08 MED ORDER — INSULIN PUMP
SUBCUTANEOUS | Status: DC
  Filled 2016-10-08: qty 1

## 2016-10-08 NOTE — Discharge Summary (Signed)
Physician Discharge Summary  Na Waldrip YQI:[id number] DOB: 18-Jan-1956 DOA: 10/06/2016  PCP: Burnard Bunting, MD  Admit date: 10/06/2016 Discharge date: 10/08/2016  Admitted From: Home Disposition:  Home  Recommendations for Outpatient Follow-up:  1. Follow up with PCP in 1week 2.   Follow-up with podiatry as an outpatient in 1 week   Home Health: No  Equipment/Devices: None  Discharge Condition: Stable  CODE STATUS: Full  Diet recommendation: Heart Healthy / Carb Modified  Brief/Interim Summary: 61 year old female with history of diabetes mellitus type 2 on insulin pump, obstructive sleep apnea, chronic bilateral foot ulcers secondary to neuropathy status post right third toe amputation in January 2018, hypertension, hyperlipidemia,, coronary artery disease, osteoarthritis and hypothyroidism presented with fever and chills. She was admitted with sepsis secondary to bronchitis/pneumonia and right great toe cellulitis. Cultures have been negative so far. She is afebrile. No leukocytosis. Her condition is improved. She'll be discharged home on oral antibiotics   Discharge Diagnoses:  Principal Problem:   Sepsis ([place]) Active Problems:   CAD (coronary artery disease)   Tobacco abuse   OSA on CPAP   HTN (hypertension)   Diabetes mellitus due to underlying condition, uncontrolled ([place])  Sepsis - Present on admission. Secondary to bronchitis/pneumonia and/or right great toe cellulitis - Status post IV fluids which has been discontinued. Currently on IV vancomycin and Levaquin. Cultures negative so far. - Sepsis has resolved. - X-ray of the right foot showed no acute osseous abnormality  Probable acute bacterial bronchitis versus community acquired pneumonia - Chest x-ray on admission was negative.  - Repeat chest x-ray from 10/07/2016 showed no infiltrates. Cultures negative so far. Currently on room air. - Discharged home on Levaquin orally  Right great toe  bacterialcellulitis - Improving. Discharge home on Levaquin for 7 days. Outpatient follow-up with podiatry in a week  Leukocytosis - Secondary to above. Resolved.   Chronic feet ulcers - Wound care consult appreciated. Outpatient follow-up with podiatry   Diabetes mellitus type 2 uncontrolled with hyperglycemia - Continue insulin pump as an outpatient. Follow-up with primary care provider and or endocrinology  Tobacco abuse - Consult about tobacco cessation  Obstructive sleep apnea - Continue CPAP  Hypertension - Continue home antihypertensives. Outpatient follow-up  Hypothyroidism - Continue Synthroid  Hyperlipidemia Continue Lipitor  Discharge Instructions  Discharge Instructions    Call MD for:  difficulty breathing, headache or visual disturbances    Complete by:  As directed    Call MD for:  extreme fatigue    Complete by:  As directed    Call MD for:  hives    Complete by:  As directed    Call MD for:  persistant dizziness or light-headedness    Complete by:  As directed    Call MD for:  persistant nausea and vomiting    Complete by:  As directed    Call MD for:  severe uncontrolled pain    Complete by:  As directed    Call MD for:  temperature >100.4    Complete by:  As directed    Diet - low sodium heart healthy    Complete by:  As directed    Diet Carb Modified    Complete by:  As directed    Increase activity slowly    Complete by:  As directed      Allergies as of 10/08/2016      Reactions   Penicillins Hives   Childhood reaction, told she had hives  Medication List    TAKE these medications   ACCU-CHEK AVIVA PLUS test strip Generic drug:  glucose blood   ALPRAZolam 0.5 MG tablet Commonly known as:  XANAX Take 0.5 mg by mouth 3 (three) times daily as needed for anxiety.   alum & mag hydroxide-simeth 200-200-20 MG/5ML suspension Commonly known as:  MAALOX/MYLANTA Take 15 mLs by mouth every 4 (four) hours as needed for  indigestion or heartburn.   atorvastatin 20 MG tablet Commonly known as:  LIPITOR Take 20 mg by mouth daily.   buPROPion 100 MG 12 hr tablet Commonly known as:  WELLBUTRIN SR Take 100 mg by mouth every 12 (twelve) hours.   escitalopram 20 MG tablet Commonly known as:  LEXAPRO Take 20 mg by mouth daily.   gabapentin 300 MG capsule Commonly known as:  NEURONTIN Take 300 mg by mouth 3 (three) times daily.   insulin pump Soln Inject 1 each into the skin continuous. Humalog - basal rate 3.2 Units/hr What changed:  how much to take  additional instructions   levofloxacin 750 MG tablet Commonly known as:  LEVAQUIN Take 1 tablet (750 mg total) by mouth daily.   levothyroxine 125 MCG tablet Commonly known as:  SYNTHROID, LEVOTHROID Take 125 mcg by mouth daily.   ondansetron 4 MG tablet Commonly known as:  ZOFRAN Take 1 tablet (4 mg total) by mouth every 6 (six) hours as needed for nausea.   OVER THE COUNTER MEDICATION Pt uses CPAP machine daily.   valsartan-hydrochlorothiazide 80-12.5 MG tablet Commonly known as:  DIOVAN-HCT Take 1 tablet by mouth daily.   Vitamin D (Ergocalciferol) 50000 units Caps capsule Commonly known as:  DRISDOL Take 50,000 Units by mouth every Saturday.      Follow-up Information    Burnard Bunting, MD Follow up in 1 week(s).   Specialty:  Internal Medicine Contact information: [place] [address] [phone]          Allergies  Allergen Reactions  . Penicillins Hives    Childhood reaction, told she had hives    Consultations:  None   Procedures/Studies: Dg Chest 2 View  Result Date: 10/07/2016 CLINICAL DATA:  Shortness of breath, fever EXAM: CHEST  2 VIEW COMPARISON:  10/06/2016 FINDINGS: Heart is upper limits normal in size. Mild peribronchial thickening and interstitial prominence. No effusions. No confluent opacities. No acute bony abnormality. IMPRESSION: Bronchitic changes, stable. Electronically  Signed   By: Rolm Baptise M.D.   On: 10/07/2016 09:09   Dg Chest 2 View  Result Date: 10/06/2016 CLINICAL DATA:  Nausea, vomiting.  Fever. EXAM: CHEST  2 VIEW COMPARISON:  12/30/2011 FINDINGS: Heart and mediastinal contours are within normal limits. No focal opacities or effusions. No acute bony abnormality. Mild peribronchial thickening. IMPRESSION: Mild bronchitic changes. Electronically Signed   By: Rolm Baptise M.D.   On: 10/06/2016 11:47   Dg Foot 2 Views Right  Result Date: 10/06/2016 CLINICAL DATA:  61 y/o F; pain and swelling of the right foot. History of diabetes. EXAM: RIGHT FOOT - 2 VIEW COMPARISON:  62718 foot radiographs. FINDINGS: Second digit amputation across the proximal interphalangeal joint and third digit amputation across the metatarsophalangeal joint. Chronic fracture of the left fourth metatarsal. Hypertrophy of first metatarsal head. Mild first metatarsophalangeal joint osteoarthrosis. Moderate-sized plantar calcaneal enthesophyte. Osteoarthrosis of the intertarsal joints with productive changes. No acute osseous abnormality identified. IMPRESSION: No acute osseous abnormality identified. Stable chronic findings as above. Electronically Signed   By: Edgardo Roys.D.  On: 10/06/2016 20:54      Subjective: Patient seen and examined at bedside. She feels much better this morning. She had some vomiting yesterday. Her cough is improved. No overnight fever.  Discharge Exam: Vitals:   10/07/16 2115 10/08/16 0508  BP: (!) 151/58 (!) 143/63  Pulse: 85 81  Resp: 18 18  Temp: 98 F (36.7 C) 97.8 F (36.6 C)  SpO2: 99% 97%   Vitals:   10/07/16 1644 10/07/16 2000 10/07/16 2115 10/08/16 0508  BP: (!) 190/78 (!) 176/79 (!) 151/58 (!) 143/63  Pulse: (!) 101 82 85 81  Resp:   18 18  Temp:   98 F (36.7 C) 97.8 F (36.6 C)  TempSrc:   Oral Oral  SpO2:   99% 97%  Weight:      Height:        General: Pt is alert, awake, not in acute distress Cardiovascular:  Rate controlled, S1/S2 + Respiratory: Bilateral decreased breath sounds at bases with some scattered crackles Abdominal: Soft, NT, ND, bowel sounds + Extremities: no cyanosis; Skin over the right great toe is erythematous but lesser compared to yesterday with slight warmth but no discharge. 2 healing chronic ulcers over the plantar aspect of the right foot with no surrounding erythema.    The results of significant diagnostics from this hospitalization (including imaging, microbiology, ancillary and laboratory) are listed below for reference.     Microbiology: Recent Results (from the past 240 hour(s))  Culture, blood (Routine x 2)     Status: None (Preliminary result)   Collection Time: 10/06/16 11:22 AM  Result Value Ref Range Status   Specimen Description BLOOD RIGHT HAND  Final   Special Requests   Final    BOTTLES DRAWN AEROBIC AND ANAEROBIC Blood Culture adequate volume   Culture NO GROWTH 1 DAY  Final   Report Status PENDING  Incomplete  Urine culture     Status: Abnormal   Collection Time: 10/06/16 12:29 PM  Result Value Ref Range Status   Specimen Description URINE, CLEAN CATCH  Final   Special Requests NONE  Final   Culture MULTIPLE SPECIES PRESENT, SUGGEST RECOLLECTION (A)  Final   Report Status 10/08/2016 FINAL  Final  Culture, blood (Routine x 2)     Status: None (Preliminary result)   Collection Time: 10/06/16  3:19 PM  Result Value Ref Range Status   Specimen Description BLOOD LEFT ANTECUBITAL  Final   Special Requests   Final    BOTTLES DRAWN AEROBIC AND ANAEROBIC Blood Culture adequate volume   Culture NO GROWTH < 24 HOURS  Final   Report Status PENDING  Incomplete     Labs: BNP (last 3 results) No results for input(s): BNP in the last 8760 hours. Basic Metabolic Panel:  Recent Labs Lab 10/06/16 1116 10/07/16 0523 10/08/16 0446  NA 132* 131* 130*  K 5.0 4.3 3.5  CL 98* 102 96*  CO2 24 19* 26  GLUCOSE 240* 259* 123*  BUN 18 12 11   CREATININE 1.33*  0.97 0.85  CALCIUM 9.4 8.6* 9.2  MG  --   --  2.0   Liver Function Tests:  Recent Labs Lab 10/06/16 1116 10/07/16 0523  AST 32 29  ALT 23 27  ALKPHOS 121 106  BILITOT 0.8 1.2  PROT 7.0 6.4*  ALBUMIN 3.4* 2.9*   No results for input(s): LIPASE, AMYLASE in the last 168 hours. No results for input(s): AMMONIA in the last 168 hours. CBC:  Recent Labs Lab 10/06/16  1116 10/07/16 0523 10/08/16 0446  WBC 16.8* 6.6 7.2  NEUTROABS 15.2*  --  4.6  HGB 11.7* 12.0 12.3  HCT 35.4* 35.3* 35.5*  MCV 95.7 94.1 89.9  PLT 187 169 170   Cardiac Enzymes: No results for input(s): CKTOTAL, CKMB, CKMBINDEX, TROPONINI in the last 168 hours. BNP: Invalid input(s): POCBNP CBG:  Recent Labs Lab 10/07/16 1211 10/07/16 1636 10/07/16 2112 10/08/16 0007 10/08/16 0751  GLUCAP 249* 264* 309* 233* 128*   D-Dimer No results for input(s): DDIMER in the last 72 hours. Hgb A1c  Recent Labs  10/07/16 0523  HGBA1C 8.4*   Lipid Profile No results for input(s): CHOL, HDL, LDLCALC, TRIG, CHOLHDL, LDLDIRECT in the last 72 hours. Thyroid function studies No results for input(s): TSH, T4TOTAL, T3FREE, THYROIDAB in the last 72 hours.  Invalid input(s): FREET3 Anemia work up No results for input(s): VITAMINB12, FOLATE, FERRITIN, TIBC, IRON, RETICCTPCT in the last 72 hours. Urinalysis    Component Value Date/Time   COLORURINE YELLOW 10/06/2016 1231   APPEARANCEUR CLEAR 10/06/2016 1231   LABSPEC 1.016 10/06/2016 1231   PHURINE 6.0 10/06/2016 1231   GLUCOSEU NEGATIVE 10/06/2016 1231   HGBUR NEGATIVE 10/06/2016 1231   BILIRUBINUR NEGATIVE 10/06/2016 1231   KETONESUR NEGATIVE 10/06/2016 1231   PROTEINUR 30 (A) 10/06/2016 1231   UROBILINOGEN 1.0 11/03/2014 0823   NITRITE NEGATIVE 10/06/2016 1231   LEUKOCYTESUR NEGATIVE 10/06/2016 1231   Sepsis Labs Invalid input(s): PROCALCITONIN,  WBC,  LACTICIDVEN Microbiology Recent Results (from the past 240 hour(s))  Culture, blood (Routine x 2)      Status: None (Preliminary result)   Collection Time: 10/06/16 11:22 AM  Result Value Ref Range Status   Specimen Description BLOOD RIGHT HAND  Final   Special Requests   Final    BOTTLES DRAWN AEROBIC AND ANAEROBIC Blood Culture adequate volume   Culture NO GROWTH 1 DAY  Final   Report Status PENDING  Incomplete  Urine culture     Status: Abnormal   Collection Time: 10/06/16 12:29 PM  Result Value Ref Range Status   Specimen Description URINE, CLEAN CATCH  Final   Special Requests NONE  Final   Culture MULTIPLE SPECIES PRESENT, SUGGEST RECOLLECTION (A)  Final   Report Status 10/08/2016 FINAL  Final  Culture, blood (Routine x 2)     Status: None (Preliminary result)   Collection Time: 10/06/16  3:19 PM  Result Value Ref Range Status   Specimen Description BLOOD LEFT ANTECUBITAL  Final   Special Requests   Final    BOTTLES DRAWN AEROBIC AND ANAEROBIC Blood Culture adequate volume   Culture NO GROWTH < 24 HOURS  Final   Report Status PENDING  Incomplete     Time coordinating discharge: 35 minutes  SIGNED:   Aline August, MD  [facility] 10/08/2016, 9:57 AM Pager: [phone]  If 7PM-7AM, please contact night-coverage [email or web address] Password [email or web address]

## 2016-10-11 ENCOUNTER — Ambulatory Visit (INDEPENDENT_AMBULATORY_CARE_PROVIDER_SITE_OTHER): Payer: BLUE CROSS/BLUE SHIELD | Admitting: Podiatry

## 2016-10-11 ENCOUNTER — Encounter: Payer: Self-pay | Admitting: Podiatry

## 2016-10-11 ENCOUNTER — Telehealth: Payer: Self-pay

## 2016-10-11 VITALS — BP 134/67 | HR 86 | Temp 97.5°F

## 2016-10-11 DIAGNOSIS — L97512 Non-pressure 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: Secondary | ICD-10-CM | POA: Diagnosis not present

## 2016-10-11 DIAGNOSIS — L03031 Cellulitis of right toe: Secondary | ICD-10-CM

## 2016-10-11 DIAGNOSIS — L02611 Cutaneous abscess of right foot: Secondary | ICD-10-CM

## 2016-10-11 LAB — CULTURE, BLOOD (ROUTINE X 2)
CULTURE: NO GROWTH
Culture: NO GROWTH
Special Requests: ADEQUATE
Special Requests: ADEQUATE

## 2016-10-11 MED ORDER — SULFAMETHOXAZOLE-TRIMETHOPRIM 800-160 MG PO TABS: 1.0000 | ORAL_TABLET | Freq: Two times a day (BID) | ORAL | 1 refills | Status: DC

## 2016-10-11 MED ORDER — SILVER SULFADIAZINE 1 % EX CREA: 1.0000 "application " | TOPICAL_CREAM | Freq: Every day | CUTANEOUS | 0 refills | Status: DC

## 2016-10-11 NOTE — Telephone Encounter (Signed)
Please make sure the patient's prescribed antibiotic went to the appropriate pharmacy

## 2016-10-11 NOTE — Patient Instructions (Signed)
Continue taking your Levaquin as prescribed In addition add Bactrim DS twice a day 7 days If the redness, swelling continues and does not respond to oral antibiotics present to the emergency department Apply Silvadene cream to the ulcers 2 on the right foot and cover with gauze Wear the surgical wedge shoe on the right foot  Diabetes and Foot Care Diabetes may cause you to have problems because of poor blood supply (circulation) to your feet and legs. This may cause the skin on your feet to become thinner, break easier, and heal more slowly. Your skin may become dry, and the skin may peel and crack. You may also have nerve damage in your legs and feet causing decreased feeling in them. You may not notice minor injuries to your feet that could lead to infections or more serious problems. Taking care of your feet is one of the most important things you can do for yourself. Follow these instructions at home:  Wear shoes at all times, even in the house. Do not go barefoot. Bare feet are easily injured.  Check your feet daily for blisters, cuts, and redness. If you cannot see the bottom of your feet, use a mirror or ask someone for help.  Wash your feet with warm water (do not use hot water) and mild soap. Then pat your feet and the areas between your toes until they are completely dry. Do not soak your feet as this can dry your skin.  Apply a moisturizing lotion or petroleum jelly (that does not contain alcohol and is unscented) to the skin on your feet and to dry, brittle toenails. Do not apply lotion between your toes.  Trim your toenails straight across. Do not dig under them or around the cuticle. File the edges of your nails with an emery board or nail file.  Do not cut corns or calluses or try to remove them with medicine.  Wear clean socks or stockings every day. Make sure they are not too tight. Do not wear knee-high stockings since they may decrease blood flow to your legs.  Wear shoes  that fit properly and have enough cushioning. To break in new shoes, wear them for just a few hours a day. This prevents you from injuring your feet. Always look in your shoes before you put them on to be sure there are no objects inside.  Do not cross your legs. This may decrease the blood flow to your feet.  If you find a minor scrape, cut, or break in the skin on your feet, keep it and the skin around it clean and dry. These areas may be cleansed with mild soap and water. Do not cleanse the area with peroxide, alcohol, or iodine.  When you remove an adhesive bandage, be sure not to damage the skin around it.  If you have a wound, look at it several times a day to make sure it is healing.  Do not use heating pads or hot water bottles. They may burn your skin. If you have lost feeling in your feet or legs, you may not know it is happening until it is too late.  Make sure your health care provider performs a complete foot exam at least annually or more often if you have foot problems. Report any cuts, sores, or bruises to your health care provider immediately. Contact a health care provider if:  You have an injury that is not healing.  You have cuts or breaks in the skin.  You have an ingrown nail.  You notice redness on your legs or feet.  You feel burning or tingling in your legs or feet.  You have pain or cramps in your legs and feet.  Your legs or feet are numb.  Your feet always feel cold. Get help right away if:  There is increasing redness, swelling, or pain in or around a wound.  There is a red line that goes up your leg.  Pus is coming from a wound.  You develop a fever or as directed by your health care provider.  You notice a bad smell coming from an ulcer or wound. This information is not intended to replace advice given to you by your health care provider. Make sure you discuss any questions you have with your health care provider. Document Released: 02/05/2000  Document Revised: 07/16/2015 Document Reviewed: 07/17/2012 Elsevier Interactive Patient Education  2017 [employer].

## 2016-10-11 NOTE — Progress Notes (Signed)
   Subjective:    Patient ID: [name], female    DOB: 1956-01-05, 61 y.o.   MRN: [id number]  HPI  Patient presents today describing a one-day history of a sudden increase in swelling and redness in the right hallux. Patient has history of recurrent ulcerations in this toe. The past week patient was hospitalized for sepsis undetermined origin and is currently taking Levaquin 750 mg 1 daily 7 days starting on 10/08/2016. In spite of the oral antibiotics patient has noticed the swelling and redness in the right great toe. Patient is a known diabetic and continues to smoke   Review of Systems  All other systems reviewed and are negative.      Objective:   Physical Exam   Orientated 3  Pedal pulses palpable bilaterally Right hallux is erythematous with a hyperkeratotic skin lesion that breaks down to a 5 mm superficial ulcer. The wound does not probe deep. There is no active drainage or malodor Plantar scaling first MPJ breaks down through superficial measuring 3 mm surrounded by hyperkeratotic tissue without any erythema edema or active drainage Amputated second third toes right Decreased sensation to 10 g monofilament wire and tuning fork         Assessment & Plan:   Assessment: Ulceration right hallux with cellulitis Superficial ulcer plantar right first MPJ  Plan: Debrided ulcer sites 2 and apply Silvadene cream Continue previous prescribed Levaquin 750 one by mouth daily Rx Bactrim DS by mouth twice a day 7 days Wear surgical wedge shoe on right Patient instructed to present to ED if she knows any significant increase in pain, redness, swelling, fever  Reappoint 7 days for follow-up with [name]

## 2016-10-14 DIAGNOSIS — R8290 Unspecified abnormal findings in urine: Secondary | ICD-10-CM | POA: Insufficient documentation

## 2016-10-17 ENCOUNTER — Ambulatory Visit: Payer: BLUE CROSS/BLUE SHIELD | Admitting: Orthotics

## 2016-10-17 ENCOUNTER — Ambulatory Visit (INDEPENDENT_AMBULATORY_CARE_PROVIDER_SITE_OTHER): Payer: BLUE CROSS/BLUE SHIELD | Admitting: Podiatry

## 2016-10-17 ENCOUNTER — Ambulatory Visit: Payer: BLUE CROSS/BLUE SHIELD | Admitting: Podiatry

## 2016-10-17 DIAGNOSIS — E0842 Diabetes mellitus due to underlying condition with diabetic polyneuropathy: Secondary | ICD-10-CM

## 2016-10-17 DIAGNOSIS — I70235 Atherosclerosis of native arteries of right leg with ulceration of other part of foot: Secondary | ICD-10-CM

## 2016-10-17 DIAGNOSIS — L97512 Non-pressure 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: Secondary | ICD-10-CM | POA: Diagnosis not present

## 2016-10-17 MED ORDER — DELAFLOXACIN MEGLUMINE 450 MG PO TABS: 1.0000 | ORAL_TABLET | Freq: Two times a day (BID) | ORAL | 0 refills | Status: DC

## 2016-10-17 NOTE — Progress Notes (Signed)
Patient left without being seen.

## 2016-10-17 NOTE — Progress Notes (Signed)
   Subjective:   well-established patient with a history of diabetes mellitus presents to office today for evaluation of a right great toe ulceration. Patient is not sure how long the ulcer is been there. She's been in and out of the hospital recently,  For possible sepsis relating to the ulceration. She presents today for follow-up treatment and evaluation.  Patient is currently taking Bactrim DS oral antibiotic   Objective/Physical Exam General: The patient is alert and oriented x3 in no acute distress.  Dermatology:  Wound #1 noted to the  Plantar aspect of the right great toe measuring approximately [phone] cm (LxWxD).   To the noted ulceration(s), there is no eschar. There is  And extensive amount of slough, fibrin, and necrotic tissue noted. Granulation tissue and wound base is red. There is a  moderate amount of purulence and serosanguineous drainage noted. There is no exposed bone muscle-tendon ligament or joint. There is no malodor. Periwound integrity is intact.  Diffuse mottling of the right great toe was also noted Skin is warm, dry and supple bilateral lower extremities.  Vascular: Palpable pedal pulses bilaterally. No edema or erythema noted. Capillary refill within normal limits.  Neurological: Epicritic and protective threshold absent bilaterally.   Musculoskeletal Exam: Range of motion within normal limits to all pedal and ankle joints bilateral. Muscle strength 5/5 in all groups bilateral.   Assessment: #1  Ulcer right great toe with cellulitis secondary to diabetes mellitus #2 diabetes mellitus w/ peripheral neuropathy   Plan of Care:  #1 Patient was evaluated. #2 medically necessary excisional debridement including  Muscle and deep fascial tissue was performed using a tissue nipper. Excisional debridement of all the necrotic nonviable tissue down to healthy bleeding viable tissue was performed with post-debridement measurements same as pre-. #3 the wound was  cleansed and dry sterile dressing applied. #4  Recommended the patient applied Betadine daily with dry sterile dressing  #5 prescription for Baxdela antibiotic x 10 days.  #6 today the wound cultures were taken and sent to pathology for culture and sensitivity  #7 return to clinic in 2 weeks.  Patient understands that she is a high risk for losing the toe,  If the infection does not subside and there is no improvement  Edrick Kins, DPM [facility]  Dr. Edrick Kins, [name]                                        [place], [place] [address]                Office [phone]  Fax [phone]

## 2016-10-20 LAB — WOUND CULTURE
GRAM STAIN: NONE SEEN
Gram Stain: NONE SEEN
ORGANISM ID, BACTERIA: NORMAL

## 2016-10-21 ENCOUNTER — Telehealth: Payer: Self-pay | Admitting: Podiatry

## 2016-10-21 NOTE — Telephone Encounter (Signed)
I informed pt that she should continue on the [place], only at this time.

## 2016-10-21 NOTE — Telephone Encounter (Signed)
I'm calling about the antibiotics I'm on. I still have the ones [name] prescribed but I have run out of the ones that [name] prescribed. I didn't know if [name] wanted me to keep taking that in conjunction with the one he prescribed. Please call me back at [phone]. Thank you.

## 2016-10-31 ENCOUNTER — Ambulatory Visit (INDEPENDENT_AMBULATORY_CARE_PROVIDER_SITE_OTHER): Payer: BLUE CROSS/BLUE SHIELD | Admitting: Podiatry

## 2016-10-31 ENCOUNTER — Encounter: Payer: Self-pay | Admitting: Podiatry

## 2016-10-31 DIAGNOSIS — I70235 Atherosclerosis of native arteries of right leg with ulceration of other part of foot: Secondary | ICD-10-CM

## 2016-10-31 DIAGNOSIS — L97512 Non-pressure 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: Secondary | ICD-10-CM

## 2016-10-31 DIAGNOSIS — E0842 Diabetes mellitus due to underlying condition with diabetic polyneuropathy: Secondary | ICD-10-CM

## 2016-11-04 NOTE — Progress Notes (Signed)
   Subjective:  61 year old female with a history of diabetes mellitus presents today for follow-up treatment and evaluation regarding an ulcer to the right great toe with cellulitis. Patient believes that she's doing much better. She recently finished her antibiotic Baxdela and she notices significant improvement of the cellulitis. She presents today for follow-up treatment and evaluation   Objective/Physical Exam General: The patient is alert and oriented x3 in no acute distress.  Dermatology:  Wound #1 noted to the  Plantar aspect of the right great toe measuring approximately [phone] cm (LxWxD).   To the noted ulceration(s), there is no eschar. There is a minimal amount of slough, fibrin, and necrotic tissue noted. Granulation tissue and wound base is red. Negative for any significant purulent drainage There is no exposed bone muscle-tendon ligament or joint. There is no malodor. Periwound integrity is intact.  Skin is warm, dry and supple bilateral lower extremities.  Vascular: Palpable pedal pulses bilaterally. No edema or erythema noted. Capillary refill within normal limits.  Neurological: Epicritic and protective threshold absent bilaterally.   Musculoskeletal Exam: Range of motion within normal limits to all pedal and ankle joints bilateral. Muscle strength 5/5 in all groups bilateral.   Assessment: #1  Ulcer right great toe with cellulitis secondary to diabetes mellitus #2 diabetes mellitus w/ peripheral neuropathy   Plan of Care:  #1 Patient was evaluated. #2 medically necessary excisional debridement including  Muscle and deep fascial tissue was performed using a tissue nipper. Excisional debridement of all the necrotic nonviable tissue down to healthy bleeding viable tissue was performed with post-debridement measurements same as pre-. #3 the wound was cleansed and dry sterile dressing applied. #4  continue daily dressing changes at home #5 return to clinic in 2  weeks  Edrick Kins, DPM [facility]  Dr. Edrick Kins, [address]                                        [place], [place] [address]                Office [phone]  Fax [phone]

## 2016-11-12 ENCOUNTER — Emergency Department (HOSPITAL_COMMUNITY): Payer: BLUE CROSS/BLUE SHIELD

## 2016-11-12 ENCOUNTER — Encounter (HOSPITAL_COMMUNITY): Payer: Self-pay

## 2016-11-12 ENCOUNTER — Inpatient Hospital Stay (HOSPITAL_COMMUNITY)
Admission: EM | Admit: 2016-11-12 | Discharge: 2016-11-14 | DRG: 871 | Disposition: A | Discharge status: Home / Self Care | Payer: BLUE CROSS/BLUE SHIELD | Attending: Internal Medicine | Admitting: Internal Medicine

## 2016-11-12 DIAGNOSIS — Z9049 Acquired absence of other specified parts of digestive tract: Secondary | ICD-10-CM

## 2016-11-12 DIAGNOSIS — Z6841 Body Mass Index (BMI) 40.0 and over, adult: Secondary | ICD-10-CM

## 2016-11-12 DIAGNOSIS — Z96653 Presence of artificial knee joint, bilateral: Secondary | ICD-10-CM | POA: Diagnosis present

## 2016-11-12 DIAGNOSIS — K760 Fatty (change of) liver, not elsewhere classified: Secondary | ICD-10-CM | POA: Diagnosis present

## 2016-11-12 DIAGNOSIS — R6520 Severe sepsis without septic shock: Secondary | ICD-10-CM | POA: Diagnosis present

## 2016-11-12 DIAGNOSIS — G4733 Obstructive sleep apnea (adult) (pediatric): Secondary | ICD-10-CM | POA: Diagnosis present

## 2016-11-12 DIAGNOSIS — Z794 Long term (current) use of insulin: Secondary | ICD-10-CM | POA: Diagnosis not present

## 2016-11-12 DIAGNOSIS — Z880 Allergy status to penicillin: Secondary | ICD-10-CM

## 2016-11-12 DIAGNOSIS — Z8249 Family history of ischemic heart disease and other diseases of the circulatory system: Secondary | ICD-10-CM

## 2016-11-12 DIAGNOSIS — M170 Bilateral primary osteoarthritis of knee: Secondary | ICD-10-CM | POA: Diagnosis present

## 2016-11-12 DIAGNOSIS — R197 Diarrhea, unspecified: Secondary | ICD-10-CM

## 2016-11-12 DIAGNOSIS — I10 Essential (primary) hypertension: Secondary | ICD-10-CM | POA: Diagnosis present

## 2016-11-12 DIAGNOSIS — Z825 Family history of asthma and other chronic lower respiratory diseases: Secondary | ICD-10-CM

## 2016-11-12 DIAGNOSIS — E1149 Type 2 diabetes mellitus with other diabetic neurological complication: Secondary | ICD-10-CM | POA: Diagnosis not present

## 2016-11-12 DIAGNOSIS — Z87442 Personal history of urinary calculi: Secondary | ICD-10-CM

## 2016-11-12 DIAGNOSIS — Z9841 Cataract extraction status, right eye: Secondary | ICD-10-CM | POA: Diagnosis not present

## 2016-11-12 DIAGNOSIS — Z820 Family history of epilepsy and other diseases of the nervous system: Secondary | ICD-10-CM

## 2016-11-12 DIAGNOSIS — I959 Hypotension, unspecified: Secondary | ICD-10-CM | POA: Diagnosis present

## 2016-11-12 DIAGNOSIS — E1110 Type 2 diabetes mellitus with ketoacidosis without coma: Secondary | ICD-10-CM

## 2016-11-12 DIAGNOSIS — Z981 Arthrodesis status: Secondary | ICD-10-CM

## 2016-11-12 DIAGNOSIS — K529 Noninfective gastroenteritis and colitis, unspecified: Secondary | ICD-10-CM

## 2016-11-12 DIAGNOSIS — E1140 Type 2 diabetes mellitus with diabetic neuropathy, unspecified: Secondary | ICD-10-CM | POA: Diagnosis present

## 2016-11-12 DIAGNOSIS — Z9989 Dependence on other enabling machines and devices: Secondary | ICD-10-CM

## 2016-11-12 DIAGNOSIS — E785 Hyperlipidemia, unspecified: Secondary | ICD-10-CM | POA: Diagnosis present

## 2016-11-12 DIAGNOSIS — T68XXXA Hypothermia, initial encounter: Secondary | ICD-10-CM | POA: Diagnosis not present

## 2016-11-12 DIAGNOSIS — I213 ST elevation (STEMI) myocardial infarction of unspecified site: Secondary | ICD-10-CM | POA: Diagnosis present

## 2016-11-12 DIAGNOSIS — Z818 Family history of other mental and behavioral disorders: Secondary | ICD-10-CM

## 2016-11-12 DIAGNOSIS — Z833 Family history of diabetes mellitus: Secondary | ICD-10-CM

## 2016-11-12 DIAGNOSIS — N179 Acute kidney failure, unspecified: Secondary | ICD-10-CM | POA: Diagnosis present

## 2016-11-12 DIAGNOSIS — F419 Anxiety disorder, unspecified: Secondary | ICD-10-CM | POA: Diagnosis present

## 2016-11-12 DIAGNOSIS — Z961 Presence of intraocular lens: Secondary | ICD-10-CM | POA: Diagnosis present

## 2016-11-12 DIAGNOSIS — A419 Sepsis, unspecified organism: Principal | ICD-10-CM | POA: Diagnosis present

## 2016-11-12 DIAGNOSIS — I2583 Coronary atherosclerosis due to lipid rich plaque: Secondary | ICD-10-CM

## 2016-11-12 DIAGNOSIS — Z823 Family history of stroke: Secondary | ICD-10-CM

## 2016-11-12 DIAGNOSIS — E1165 Type 2 diabetes mellitus with hyperglycemia: Secondary | ICD-10-CM | POA: Diagnosis present

## 2016-11-12 DIAGNOSIS — E039 Hypothyroidism, unspecified: Secondary | ICD-10-CM | POA: Diagnosis present

## 2016-11-12 DIAGNOSIS — Z9641 Presence of insulin pump (external) (internal): Secondary | ICD-10-CM | POA: Diagnosis present

## 2016-11-12 DIAGNOSIS — Z9842 Cataract extraction status, left eye: Secondary | ICD-10-CM | POA: Diagnosis not present

## 2016-11-12 DIAGNOSIS — E860 Dehydration: Secondary | ICD-10-CM | POA: Diagnosis present

## 2016-11-12 DIAGNOSIS — Z90710 Acquired absence of both cervix and uterus: Secondary | ICD-10-CM

## 2016-11-12 DIAGNOSIS — F17210 Nicotine dependence, cigarettes, uncomplicated: Secondary | ICD-10-CM | POA: Diagnosis present

## 2016-11-12 DIAGNOSIS — I2510 Atherosclerotic heart disease of native coronary artery without angina pectoris: Secondary | ICD-10-CM | POA: Diagnosis present

## 2016-11-12 DIAGNOSIS — R680 Hypothermia, not associated with low environmental temperature: Secondary | ICD-10-CM | POA: Diagnosis present

## 2016-11-12 LAB — BLOOD GAS, ARTERIAL
Acid-base deficit: 6.1 mmol/L — ABNORMAL HIGH (ref 0.0–2.0)
BICARBONATE: 20.1 mmol/L (ref 20.0–28.0)
Drawn by: 105521
O2 Content: 3 L/min
O2 Saturation: 95.7 %
PATIENT TEMPERATURE: 94.6
PH ART: 7.313 — AB (ref 7.350–7.450)
pCO2 arterial: 39.4 mmHg (ref 32.0–48.0)
pO2, Arterial: 81.2 mmHg — ABNORMAL LOW (ref 83.0–108.0)

## 2016-11-12 LAB — LACTIC ACID, PLASMA: LACTIC ACID, VENOUS: 2.4 mmol/L — AB (ref 0.5–1.9)

## 2016-11-12 LAB — COMPREHENSIVE METABOLIC PANEL
ALBUMIN: 3.7 g/dL (ref 3.5–5.0)
ALK PHOS: 120 U/L (ref 38–126)
ALT: 17 U/L (ref 14–54)
ANION GAP: 11 (ref 5–15)
AST: 17 U/L (ref 15–41)
BUN: 24 mg/dL — ABNORMAL HIGH (ref 6–20)
CALCIUM: 9.5 mg/dL (ref 8.9–10.3)
CO2: 22 mmol/L (ref 22–32)
Chloride: 104 mmol/L (ref 101–111)
Creatinine, Ser: 1.58 mg/dL — ABNORMAL HIGH (ref 0.44–1.00)
GFR, EST AFRICAN AMERICAN: 39 mL/min — AB (ref >60)
GFR, EST NON AFRICAN AMERICAN: 34 mL/min — AB (ref >60)
Glucose, Bld: 408 mg/dL — ABNORMAL HIGH (ref 65–99)
POTASSIUM: 4.3 mmol/L (ref 3.5–5.1)
Sodium: 137 mmol/L (ref 135–145)
TOTAL PROTEIN: 7.4 g/dL (ref 6.5–8.1)
Total Bilirubin: 0.6 mg/dL (ref 0.3–1.2)

## 2016-11-12 LAB — I-STAT CHEM 8, ED
BUN: 25 mg/dL — AB (ref 6–20)
CALCIUM ION: 1.24 mmol/L (ref 1.15–1.40)
Chloride: 104 mmol/L (ref 101–111)
Creatinine, Ser: 1.4 mg/dL — ABNORMAL HIGH (ref 0.44–1.00)
Glucose, Bld: 395 mg/dL — ABNORMAL HIGH (ref 65–99)
HEMATOCRIT: 42 % (ref 36.0–46.0)
HEMOGLOBIN: 14.3 g/dL (ref 12.0–15.0)
Potassium: 4.1 mmol/L (ref 3.5–5.1)
SODIUM: 139 mmol/L (ref 135–145)
TCO2: 22 mmol/L (ref 22–32)

## 2016-11-12 LAB — I-STAT TROPONIN, ED: Troponin i, poc: 0 ng/mL (ref 0.00–0.08)

## 2016-11-12 LAB — CBC WITH DIFFERENTIAL/PLATELET
BASOS PCT: 0 %
Basophils Absolute: 0 10*3/uL (ref 0.0–0.1)
Eosinophils Absolute: 0 10*3/uL (ref 0.0–0.7)
Eosinophils Relative: 0 %
HEMATOCRIT: 42.4 % (ref 36.0–46.0)
HEMOGLOBIN: 13.9 g/dL (ref 12.0–15.0)
LYMPHS PCT: 20 %
Lymphs Abs: 3.1 10*3/uL (ref 0.7–4.0)
MCH: 31.5 pg (ref 26.0–34.0)
MCHC: 32.8 g/dL (ref 30.0–36.0)
MCV: 96.1 fL (ref 78.0–100.0)
MONOS PCT: 3 %
Monocytes Absolute: 0.5 10*3/uL (ref 0.1–1.0)
NEUTROS ABS: 11.8 10*3/uL — AB (ref 1.7–7.7)
NEUTROS PCT: 77 %
Platelets: 255 10*3/uL (ref 150–400)
RBC: 4.41 MIL/uL (ref 3.87–5.11)
RDW: 13.9 % (ref 11.5–15.5)
WBC: 15.4 10*3/uL — ABNORMAL HIGH (ref 4.0–10.5)

## 2016-11-12 LAB — MAGNESIUM: MAGNESIUM: 2 mg/dL (ref 1.7–2.4)

## 2016-11-12 LAB — I-STAT CG4 LACTIC ACID, ED: LACTIC ACID, VENOUS: 6.53 mmol/L — AB (ref 0.5–1.9)

## 2016-11-12 LAB — LIPASE, BLOOD: Lipase: 16 U/L (ref 11–51)

## 2016-11-12 LAB — CBG MONITORING, ED: GLUCOSE-CAPILLARY: 254 mg/dL — AB (ref 65–99)

## 2016-11-12 MED ORDER — SODIUM CHLORIDE 0.9 % IV SOLN
1250.0000 mg | INTRAVENOUS | Status: DC
  Filled 2016-11-12: qty 1250

## 2016-11-12 MED ORDER — DEXTROSE 5 % IV SOLN
2.0000 g | Freq: Three times a day (TID) | INTRAVENOUS | Status: DC
  Administered 2016-11-13: 2 g via INTRAVENOUS
  Filled 2016-11-12 (×3): qty 2

## 2016-11-12 MED ORDER — LEVOFLOXACIN IN D5W 750 MG/150ML IV SOLN
750.0000 mg | INTRAVENOUS | Status: DC
  Administered 2016-11-13: 750 mg via INTRAVENOUS
  Filled 2016-11-12 (×2): qty 150

## 2016-11-12 MED ORDER — LEVOFLOXACIN IN D5W 750 MG/150ML IV SOLN
750.0000 mg | Freq: Once | INTRAVENOUS | Status: AC
  Administered 2016-11-12: 750 mg via INTRAVENOUS
  Filled 2016-11-12: qty 150

## 2016-11-12 MED ORDER — SODIUM CHLORIDE 0.9 % IV BOLUS (SEPSIS)
1000.0000 mL | Freq: Once | INTRAVENOUS | Status: AC
  Administered 2016-11-12: 1000 mL via INTRAVENOUS

## 2016-11-12 MED ORDER — DEXTROSE 5 % IV SOLN
2.0000 g | Freq: Once | INTRAVENOUS | Status: AC
  Administered 2016-11-12: 2 g via INTRAVENOUS
  Filled 2016-11-12: qty 2

## 2016-11-12 MED ORDER — VANCOMYCIN HCL IN DEXTROSE 1-5 GM/200ML-% IV SOLN
1000.0000 mg | INTRAVENOUS | Status: AC
  Administered 2016-11-12: 1000 mg via INTRAVENOUS
  Filled 2016-11-12: qty 200

## 2016-11-12 MED ORDER — SODIUM CHLORIDE 0.9 % IV BOLUS (SEPSIS): 2000.0000 mL | Freq: Once | INTRAVENOUS | Status: DC

## 2016-11-12 MED ORDER — VANCOMYCIN HCL IN DEXTROSE 1-5 GM/200ML-% IV SOLN
1000.0000 mg | Freq: Once | INTRAVENOUS | Status: AC
  Administered 2016-11-12: 1000 mg via INTRAVENOUS
  Filled 2016-11-12: qty 200

## 2016-11-12 NOTE — ED Notes (Signed)
2nd set of blood culture just obtained at this time---- Dr. Roel Cluck was made aware.

## 2016-11-12 NOTE — H&P (Signed)
[name] ZOX:[id number] DOB: 1955-04-29 DOA: 11/12/2016     PCP: Burnard Bunting, MD   Outpatient Specialists: Hoyt Koch ID [place] Patient coming from:    home Lives  With family    Chief Complaint: Nausea vomiting diarrhea  HPI: [name] is a 61 y.o. female with medical history significant of diabetes mellitus type 2 on insulin pump, obstructive sleep apnea, chronic bilateral foot ulcers secondary to neuropathy status post right third toe amputation in January 2018, hypertension, hyperlipidemia,, coronary artery disease, osteoarthritis and hypothyroidism    Presented with diarrhea nausea and vomiting since this a.m. she been having elevated blood sugars up to 452. She's been feeling very lightheaded and oral week denies any blood in his stools When she called EMS continued to have nausea and vomiting while in the ambulance and had an episode of diarrhea when she arrived to emerge department initial blood pressure systolics down to [id number], she was hypothermic,  sepsis protocol was initiated in the emergency department and she was given broad-spectrum antibiotics and IV fluids after which her vital signs have improved.    He certainly admitted for sepsis secondary to pneumonia and right great toe cellulitis from 16th to 18th of August in the hospital she was given Levaquin and vancomycin. Was discharged home on Levaquin for 7 days. She saw her podiatrist and was switched to Bactrim DS and her ulcer has improved. She reports diarrhea off and on for weeks. She would have couple of loose stools per day on occasion and that would happen once 2-3 weeks. These episodes associated with nausea and vomiting.  Had episodes today. States she has not been eating or drinking well. Today all she had was a fried cherry pie and some nuts. No blood in vomit or stool. She was in the bathroom vomiting today and felt very diaphoretic.    Otherwise patient denies any chest pain no fevers at home no  recent cough or urinary complaints Regarding pertinent Chronic problems: Patient has type 2 diabetes status post insulin pump that she has left home recently blood sugar has been elevated into 400 range Reports the ulcer on her toe has healed. She continues to smoke.   IN ER:  Temp (24hrs), Avg:96.6 F (35.9 C), Min:94.8 F (34.9 C), Max:98.3 F (36.8 C)      on arrival  ED Triage Vitals  Enc Vitals Group     BP 11/12/16 1519 (!) 64/45     Pulse Rate 11/12/16 1519 86     Resp 11/12/16 1519 20     Temp 11/12/16 1519 (!) 94.8 F (34.9 C)     Temp Source 11/12/16 1519 Rectal     SpO2 11/12/16 1519 (!) 88 %     Weight 11/12/16 2006 265 lb (120.2 kg)     Height 11/12/16 2006 5\' 7"  (1.702 m)     Head Circumference --      Peak Flow --      Pain Score --      Pain Loc --      Pain Edu? --      Excl. in [place]? --     Latest  96% HR 77 BP 124/46 Lactic acid 6.53>2.4 ABG 7.313/39/81 Na 139 K 4.1cr 1.4 up from baseline of 0.85 CXR: non acute CT abd: non acute Following Medications were ordered in ER: Medications  sodium chloride 0.9 % bolus 1,000 mL (0 mLs Intravenous Stopped 11/12/16 1643)    And  sodium chloride 0.9 %  bolus 1,000 mL (0 mLs Intravenous Stopped 11/12/16 1646)    And  sodium chloride 0.9 % bolus 1,000 mL (0 mLs Intravenous Stopped 11/12/16 1749)    And  sodium chloride 0.9 % bolus 1,000 mL (0 mLs Intravenous Stopped 11/12/16 1829)  levofloxacin (LEVAQUIN) IVPB 750 mg (0 mg Intravenous Stopped 11/12/16 1711)  aztreonam (AZACTAM) 2 g in dextrose 5 % 50 mL IVPB (0 g Intravenous Stopped 11/12/16 1725)  vancomycin (VANCOCIN) IVPB 1000 mg/200 mL premix (0 mg Intravenous Stopped 11/12/16 1829)     Hospitalist was called for admission for sepsis and dehydration  Review of Systems:    Pertinent positives include: fatigue, nausea, vomiting, diarrhea,   Constitutional:  No weight loss, night sweats, Fevers, chills,  weight loss  HEENT:  No headaches, Difficulty  swallowing,Tooth/dental problems,Sore throat,  No sneezing, itching, ear ache, nasal congestion, post nasal drip,  Cardio-vascular:  No chest pain, Orthopnea, PND, anasarca, dizziness, palpitations.no Bilateral lower extremity swelling  GI:  No heartburn, indigestion, abdominal pain, change in bowel habits, loss of appetite, melena, blood in stool, hematemesis Resp:  no shortness of breath at rest. No dyspnea on exertion, No excess mucus, no productive cough, No non-productive cough, No coughing up of blood.No change in color of mucus.No wheezing. Skin:  no rash or lesions. No jaundice GU:  no dysuria, change in color of urine, no urgency or frequency. No straining to urinate.  No flank pain.  Musculoskeletal:  No joint pain or no joint swelling. No decreased range of motion. No back pain.  Psych:  No change in mood or affect. No depression or anxiety. No memory loss.  Neuro: no localizing neurological complaints, no tingling, no weakness, no double vision, no gait abnormality, no slurred speech, no confusion  As per HPI otherwise 10 point review of systems negative.   Past Medical History: Past Medical History:  Diagnosis Date  . Anxiety   . Arthritis    knees  . Coronary artery disease   . Depression   . Fatty liver   . History of nuclear stress test 12/16/2011   exercise myoview; normal images with 2-26mm ST-segment depression - subsequent cath revelaed subtotally occluded small 2nd marginal branch & 75% PDA lesion, normal LV function  . Hypertension   . Hypothyroidism   . Neuropathy    in feet  . OSA on CPAP    AHI = 44 (per patient) setting 13 per pt  . Personal history of kidney stones   . Primary localized osteoarthritis of left knee 04/24/2015  . Primary localized osteoarthritis of right knee 07/02/2015  . Tobacco abuse   . Type 2 diabetes mellitus (HCC)    insulin pump   Past Surgical History:  Procedure Laterality Date  . 3rd right toe amputation  02/2016  .  AMPUTATION Right 12/22/2014   Procedure: RIGHT 2ND TOE AMPUTATION;  Surgeon: [name], MD;  Location: [place];  Service: Orthopedics;  Laterality: Right;  . BREAST ENHANCEMENT SURGERY Bilateral   . CARDIAC CATHETERIZATION  01/04/2012   subtotally occluded small 2nd marginal branch & 75% PDA lesion, normal LV function  . CATARACT EXTRACTION Bilateral    with lens implants  . CHOLECYSTECTOMY  2008  . COLONOSCOPY    . COLONOSCOPY WITH PROPOFOL N/A 05/12/2016   Procedure: COLONOSCOPY WITH PROPOFOL;  Surgeon: Juanita Craver, MD;  Location: [facility];  Service: Endoscopy;  Laterality: N/A;  . I&D KNEE WITH POLY EXCHANGE Left 05/13/2015   Procedure: IRRIGATION AND DEBRIDEMENT KNEE WITH  POLY EXCHANGE;  Surgeon: Marchia Bond, MD;  Location: [place];  Service: Orthopedics;  Laterality: Left;  . LEFT HEART CATHETERIZATION WITH CORONARY ANGIOGRAM N/A 01/04/2012   Procedure: LEFT HEART CATHETERIZATION WITH CORONARY ANGIOGRAM;  Surgeon: Lorretta Harp, MD;  Location: [facility] CATH LAB;  Service: Cardiovascular;  Laterality: N/A;  . PARTIAL KNEE ARTHROPLASTY Left 04/24/2015   Procedure: LEFT UNICOMPARTMENTAL KNEE ARTHROPLASTY ;  Surgeon: Marchia Bond, MD;  Location: [place];  Service: Orthopedics;  Laterality: Left;  . PARTIAL KNEE ARTHROPLASTY Right 07/02/2015   Procedure: RIGHT UNI KNEE ARTHROPLASTY;  Surgeon: Marchia Bond, MD;  Location: [place];  Service: Orthopedics;  Laterality: Right;  ANESTHESIA:  GENERAL, PRE/POST OP FEMORAL NERVE  . POSTERIOR CERVICAL FUSION/FORAMINOTOMY  1990  . ROBOTIC ASSISTED TOTAL HYSTERECTOMY WITH BILATERAL SALPINGO OOPHERECTOMY Bilateral 02/17/2015   Procedure: ROBOTIC ASSISTED TOTAL HYSTERECTOMY WITH BILATERAL SALPINGO OOPHORECTOMY;  Surgeon: Everitt Amber, MD;  Location: [facility];  Service: Gynecology;  Laterality: Bilateral;     Social History:  Ambulatory   independently    reports that she has been smoking Cigarettes.  She has a 25.00  pack-year smoking history. She has never used smokeless tobacco. She reports that she does not drink alcohol or use drugs.  Allergies:   Allergies  Allergen Reactions  . Penicillins Hives    Childhood reaction, told she had hives       Family History:  Family History  Problem Relation Age of Onset  . Stroke Mother   . Hypertension Mother   . Diabetes Mother   . Alzheimer's disease Mother   . Diabetes Father   . COPD Father        vent-dependent, MODS  . Hypertension Sister   . Mental illness Sister        borderline personality d/o  . Mental illness Sister        schizoeffective d/o  . Diabetes Sister     Medications: Prior to Admission medications   Medication Sig Start Date End Date Taking? Authorizing Provider  ACCU-CHEK AVIVA PLUS test strip  12/19/14   [provider]  ALPRAZolam Duanne Moron) 0.5 MG tablet Take 0.5 mg by mouth 3 (three) times daily as needed for anxiety.  05/07/15   [provider]  alum & mag hydroxide-simeth (MAALOX/MYLANTA) 200-200-20 MG/5ML suspension Take 15 mLs by mouth every 4 (four) hours as needed for indigestion or heartburn. 10/08/16   Aline August, MD  atorvastatin (LIPITOR) 20 MG tablet Take 20 mg by mouth daily. 10/12/14   [provider]  buPROPion (WELLBUTRIN SR) 100 MG 12 hr tablet Take 100 mg by mouth every 12 (twelve) hours. 10/06/16   [provider]  Delafloxacin Meglumine (BAXDELA) 450 MG TABS Take 1 tablet by mouth 2 (two) times daily. 10/17/16   Edrick Kins, DPM  escitalopram (LEXAPRO) 20 MG tablet Take 20 mg by mouth daily.     [provider]  gabapentin (NEURONTIN) 300 MG capsule Take 300 mg by mouth 3 (three) times daily. 10/12/14   [provider]  Insulin Human (INSULIN PUMP) SOLN Inject 1 each into the skin continuous. Humalog - basal rate 3.2 Units/hr 10/08/16   Aline August, MD  levothyroxine (SYNTHROID, LEVOTHROID) 125 MCG tablet Take 125 mcg by mouth daily. 10/06/16    [provider]  losartan-hydrochlorothiazide (HYZAAR) 100-12.5 MG tablet Take 1 tablet by mouth daily. 10/27/16   [provider]  NOVOLOG 100 UNIT/ML injection UTD INJ UP TO A MAX OF  [place] INSULIN PUMP 10/19/16   [provider]  ondansetron (ZOFRAN) 4 MG tablet Take 1 tablet (4 mg total) by mouth every 6 (six) hours as needed for nausea. 10/08/16   Aline August, MD  OVER THE COUNTER MEDICATION Pt uses CPAP machine daily.    [provider]  silver sulfADIAZINE (SILVADENE) 1 % cream Apply 1 application topically daily. 10/11/16   Tuchman, Leslye Peer, DPM  sulfamethoxazole-trimethoprim (BACTRIM DS) 800-160 MG tablet Take 1 tablet by mouth 2 (two) times daily. 10/11/16   Tuchman, Richard C, DPM  valsartan-hydrochlorothiazide (DIOVAN-HCT) 80-12.5 MG per tablet Take 1 tablet by mouth daily.    [provider]  Vitamin D, Ergocalciferol, (DRISDOL) 50000 units CAPS capsule Take 50,000 Units by mouth every Saturday.  01/01/16   [provider]    Physical Exam: Patient Vitals for the past 24 hrs:  BP Temp Temp src Pulse Resp SpO2 Height Weight  11/12/16 2006 - - - - - - 5\' 7"  (1.702 m) 120.2 kg (265 lb)  11/12/16 2000 (!) 124/46 98.3 F (36.8 C) Oral 77 - 96 % - -  11/12/16 1845 118/62 - - 80 (!) 23 98 % - -  11/12/16 1830 123/60 - - 81 (!) 22 98 % - -  11/12/16 1820 120/84 - - 78 18 100 % - -  11/12/16 1815 (!) 103/51 - - 80 (!) 23 97 % - -  11/12/16 1805 126/63 - - 83 (!) 21 99 % - -  11/12/16 1800 110/61 - - 85 (!) 21 94 % - -  11/12/16 1740 (!) 108/54 - - 79 16 99 % - -  11/12/16 1720 (!) 108/52 - - 75 17 100 % - -  11/12/16 1715 111/60 - - 78 18 99 % - -  11/12/16 1705 118/61 - - 77 16 100 % - -  11/12/16 1700 (!) 106/57 - - 77 17 98 % - -  11/12/16 1645 (!) 105/53 - - 71 17 99 % - -  11/12/16 1630 (!) 101/49 - - 71 16 99 % - -  11/12/16 1625 (!) 97/48 - - 73 19 100 % - -  11/12/16 1615 (!) 93/58 - - 71 19 97 % - -    11/12/16 1610 (!) 83/63 - - 71 (!) 21 98 % - -  11/12/16 1600 (!) 91/47 - - 72 15 91 % - -  11/12/16 1550 (!) 84/45 - - 75 18 98 % - -  11/12/16 1545 (!) 84/42 - - 76 19 91 % - -  11/12/16 1540 (!) 74/37 - - 81 (!) 23 90 % - -  11/12/16 1530 (!) 72/49 - - 81 17 (!) 87 % - -  11/12/16 1520 (!) 64/45 - - 90 20 90 % - -  11/12/16 1519 (!) 64/45 (!) 94.8 F (34.9 C) Rectal 86 20 (!) 88 % - -    1. General:  in No Acute distress, well apearing  well  -appearing 2. Psychological: Alert and  Oriented 3. Head/ENT:    Dry Mucous Membranes                          Head Non traumatic, neck supple                            Poor Dentition 4. SKIN:  decreased Skin turgor,  Skin clean Dry and intact no rash 5. Heart: Regular rate and rhythm no  Murmur, no Rub or gallop 6. Lungs:  no wheezes or crackles   7. Abdomen: Soft,  non-tender, Non distended   obese  bowel sounds present 8. Lower extremities: no clubbing, cyanosis, or edema 9. Neurologically Grossly intact, moving all 4 extremities equally  10. MSK: Normal range of motion   body mass index is 41.5 kg/m.  Labs on Admission:   Labs on Admission: I have personally reviewed following labs and imaging studies  CBC:  Recent Labs Lab 11/12/16 1550 11/12/16 1603  WBC 15.4*  --   NEUTROABS 11.8*  --   HGB 13.9 14.3  HCT 42.4 42.0  MCV 96.1  --   PLT 255  --    Basic Metabolic Panel:  Recent Labs Lab 11/12/16 1550 11/12/16 1603  NA 137 139  K 4.3 4.1  CL 104 104  CO2 22  --   GLUCOSE 408* 395*  BUN 24* 25*  CREATININE 1.58* 1.40*  CALCIUM 9.5  --   MG 2.0  --    GFR: Estimated Creatinine Clearance: 55.9 mL/min (A) (by C-G formula based on SCr of 1.4 mg/dL (H)). Liver Function Tests:  Recent Labs Lab 11/12/16 1550  AST 17  ALT 17  ALKPHOS 120  BILITOT 0.6  PROT 7.4  ALBUMIN 3.7    Recent Labs Lab 11/12/16 1519  LIPASE 16   No results for input(s): AMMONIA in the last 168 hours. Coagulation  Profile: No results for input(s): INR, PROTIME in the last 168 hours. Cardiac Enzymes: No results for input(s): CKTOTAL, CKMB, CKMBINDEX, TROPONINI in the last 168 hours. BNP (last 3 results) No results for input(s): PROBNP in the last 8760 hours. HbA1C: No results for input(s): HGBA1C in the last 72 hours. CBG: No results for input(s): GLUCAP in the last 168 hours. Lipid Profile: No results for input(s): CHOL, HDL, LDLCALC, TRIG, CHOLHDL, LDLDIRECT in the last 72 hours. Thyroid Function Tests: No results for input(s): TSH, T4TOTAL, FREET4, T3FREE, THYROIDAB in the last 72 hours. Anemia Panel: No results for input(s): VITAMINB12, FOLATE, FERRITIN, TIBC, IRON, RETICCTPCT in the last 72 hours. Urine analysis:    Component Value Date/Time   COLORURINE YELLOW 10/06/2016 1231   APPEARANCEUR CLEAR 10/06/2016 1231   LABSPEC 1.016 10/06/2016 1231   PHURINE 6.0 10/06/2016 1231   GLUCOSEU NEGATIVE 10/06/2016 1231   HGBUR NEGATIVE 10/06/2016 1231   BILIRUBINUR NEGATIVE 10/06/2016 1231   KETONESUR NEGATIVE 10/06/2016 1231   PROTEINUR 30 (A) 10/06/2016 1231   UROBILINOGEN 1.0 11/03/2014 0823   NITRITE NEGATIVE 10/06/2016 1231   LEUKOCYTESUR NEGATIVE 10/06/2016 1231   Sepsis Labs: @LABRCNTIP (procalcitonin:4,lacticidven:4) )No results found for this or any previous visit (from the past 240 hour(s)).     UA Pending   Lab Results  Component Value Date   HGBA1C 8.4 (H) 10/07/2016    Estimated Creatinine Clearance: 55.9 mL/min (A) (by C-G formula based on SCr of 1.4 mg/dL (H)).  BNP (last 3 results) No results for input(s): PROBNP in the last 8760 hours.   ECG REPORT  Independently reviewed Rate: 84  Rhythm: NSR ST&T Change: No acute ischemic changes  QTC 495  Filed Weights   11/12/16 2006  Weight: 120.2 kg (265 lb)     Cultures:    Component Value Date/Time   SDES BLOOD LEFT ANTECUBITAL 10/06/2016 1519   SPECREQUEST  10/06/2016 1519    BOTTLES DRAWN AEROBIC AND  ANAEROBIC Blood Culture adequate  volume   CULT NO GROWTH 5 DAYS 10/06/2016 1519   REPTSTATUS 10/11/2016 FINAL 10/06/2016 1519     Radiological Exams on Admission: Ct Abdomen Pelvis Wo Contrast  Result Date: 11/12/2016 CLINICAL DATA:  Nausea and vomiting. Pt had nausea and vomiting that started this morning. Pt does have insulin pump that was left at home. CBG was 452 upon [facility] arrival. Pt vomited 3x while in truck and had one episode of diarrhea upon arrival. EXAM: CT ABDOMEN AND PELVIS WITHOUT CONTRAST TECHNIQUE: Multidetector CT imaging of the abdomen and pelvis was performed following the standard protocol without IV contrast. COMPARISON:  CT 10/30/2014 FINDINGS: Lower chest: Mild linear atelectasis at the lung bases. 7 mm nodule in the RIGHT lower lobe along the pleural surface (image number 6, series 6). Hepatobiliary: No focal hepatic lesion. Postcholecystectomy. No biliary dilatation. Pancreas: Pancreas is normal. No ductal dilatation. No pancreatic inflammation. Spleen: Normal spleen Adrenals/urinary tract: Adrenal glands and kidneys are normal. The ureters and bladder normal. Stomach/Bowel: Stomach, small-bowel cecum normal. Colon is normal. Rectum normal. Vascular/Lymphatic: Abdominal aorta is normal caliber with atherosclerotic calcification. There is no retroperitoneal or periportal lymphadenopathy. No pelvic lymphadenopathy. Reproductive: Post hysterectomy. Other: Trace free fluid in the posterior cul-de-sac (image 79, series 2). Musculoskeletal: No aggressive osseous lesion. IMPRESSION: 1. No evidence of abdominopelvic infection. 2. Trace free fluid in the pelvis. 3. Mild basilar atelectasis. 4. RIGHT lower lobe lobe pulmonary nodule. Non-contrast chest CT at 6-12 months is recommended. If the nodule is stable at time of repeat CT, then future CT at 18-24 months (from today's scan) is considered optional for low-risk patients, but is recommended for high-risk patients. This recommendation  follows the consensus statement: Guidelines for Management of Incidental Pulmonary Nodules Detected on CT Images: From the Fleischner Society 2017; Radiology 2017; 284:228-243. Electronically Signed   By: Suzy Bouchard M.D.   On: 11/12/2016 19:42   Dg Chest Port 1 View  Result Date: 11/12/2016 CLINICAL DATA:  Nausea and vomiting. EXAM: PORTABLE CHEST 1 VIEW COMPARISON:  October 07, 2016 FINDINGS: Haziness over the left chest is likely due to patient rotation and overlapping soft tissues. The heart, hila, mediastinum, lungs, and pleura are otherwise normal. IMPRESSION: No active disease. Electronically Signed   By: Dorise Bullion III M.D   On: 11/12/2016 16:16    Chart has been reviewed    Assessment/Plan   61 y.o. female with medical history significant of diabetes mellitus type 2 on insulin pump, obstructive sleep apnea, chronic bilateral foot ulcers secondary to neuropathy status post right third toe amputation in January 2018, hypertension, hyperlipidemia,, coronary artery disease, osteoarthritis and hypothyroidism Admitted for sepsis and dehydration   Present on Admission: . Sepsis ([place]) - unclear etiology but given initial presentation with severe hypothermia and hypotension with elevated lactic acid will obtain blood cultures, initiate broad spectrum antibiotics until source identified obtain blood cultures of note 10 blood cultures had to be obtained after antibiotics were started. Adjust antibiotics as needed. It is possible that sepsis physiology could be explained by severe dehydration . DM (diabetes mellitus) type 2,  - continue insulin pump at this point blood sugar has been improved after administration of IV fluids no evidence of DKA, patient is alert and oriented and able to manage her pump. Order diabetes coordinator consult . HTN (hypertension) -given hypotension will hold home medication . Hyperlipidemia -stable continue home medications  . Diarrhea  - intermittent not  typical for C. difficile but given recent exposure to antibiotics we'll obtain  gastric panel as well as C. difficile PCR . AKI (acute kidney injury) ([place]) - most likely secondary to dehydration, order urine electrolytes will rehydrate and monitor . CAD (coronary artery disease) - given episode of lightheadedness and diaphoresis on monitor on telemetry cycle cardiac enzymes given high-risk for silent ischemia history of diabetes and coronary disease OSA - CPAP ordered Hx of diabetic foot ulcer currently improved Other plan as per orders.  DVT prophylaxis:   Lovenox     Code Status:  FULL CODE  as per patient    Family Communication:   Family not at  Bedside    Disposition Plan:      To home once workup is complete and patient is stable                                               Diabetes coordinator    consulted                          Consults called: none  Admission status:   inpatient       Level of care   tele           I have spent a total of 56 min on this admission   Lashya Passe 11/12/2016, 9:56 PM    [facility]  Pager [phone]   after 2 AM please page floor coverage PA If 7AM-7PM, please contact the day team taking care of the patient  [email or web address]  Password [email or web address]

## 2016-11-12 NOTE — ED Notes (Signed)
Bed: HD89 Expected date:  Expected time:  Means of arrival:  Comments: EMS N/V, hyperglycemia

## 2016-11-12 NOTE — ED Triage Notes (Signed)
Pt had nausea and vomiting that started this morning. Pt does have insulin pump that was left at home. CBG was 452 upon [facility] arrival. Pt vomited 3x while in truck and had one episode of diarrhea upon arrival. Pt BP was low 70s over 40s. MD notified and sepsis protocol initiated.

## 2016-11-12 NOTE — ED Notes (Signed)
Bair Hugger placed.

## 2016-11-12 NOTE — ED Notes (Signed)
Patient transported to CT 

## 2016-11-12 NOTE — ED Notes (Signed)
Lactic acid critical 2.4 from Lab

## 2016-11-12 NOTE — Progress Notes (Signed)
Pharmacy Antibiotic Note  [name] is a 61 y.o. female with nausea, vomiting and diarhea admitted on 11/12/2016 with sepsis.  Pharmacy has been consulted for azactam, vancomycin and levaquin dosing. Note pt has tolerated cephalosporins in past, consider changing.   Plan: Azactam 2 gm IV q8h Levaquin 750 mg IV q24h Vancomycin 1 gm + 1 gm = 2 Gm load then 1250 mg IV q24h F/u scr/cultures/levels  Height: 5\' 7"  (170.2 cm) Weight: 265 lb (120.2 kg) IBW/kg (Calculated) : 61.6  Temp (24hrs), Avg:96.6 F (35.9 C), Min:94.8 F (34.9 C), Max:98.3 F (36.8 C)   Recent Labs Lab 11/12/16 1550 11/12/16 1603 11/12/16 1903  WBC 15.4*  --   --   CREATININE 1.58* 1.40*  --   LATICACIDVEN  --  6.53* 2.4*    Estimated Creatinine Clearance: 55.9 mL/min (A) (by C-G formula based on SCr of 1.4 mg/dL (H)).    Allergies  Allergen Reactions  . Penicillins Hives    Childhood reaction, told she had hives    Antimicrobials this admission: 9/22 azactam >>  9/22 levaquin >>  9/22 vancomycin  Dose adjustments this admission:   Microbiology results:  BCx:   UCx:    Sputum:    MRSA PCR:   Thank you for allowing pharmacy to be a part of this patient's care.  Dorrene German 11/12/2016 10:20 PM

## 2016-11-12 NOTE — ED Provider Notes (Signed)
[facility] DEPT Provider Note   CSN: [id number] Arrival date & time: 11/12/16  1453     History   Chief Complaint Chief Complaint  Patient presents with  . Nausea  . Vomiting  . Hypotension    HPI Puja Caffey is a 61 y.o. female. Chief complaint is vomiting, diarrhea, weakness.  HPI:  22 female. History of insulin dependent diabetes on insulin pump. Describes a normal day yesterday. Awakened early this morning with nausea, vomiting, and diarrhea. Heme-negative nonbilious emesis. No bloody stools. Week. Lightheaded. Called paramedics. Blood sugar over 409 paramedic rig. 2 episodes of emesis. Initial blood pressure 140. Upon arrival here pressure 60 systolic, saturations 50%, heart rate 90. Awake. Moaning.  Recent admission for sepsis September 16 through September 18, thought to be pneumonia, also diabetic foot ulcer.. Treated with antibiotics, completed Levaquin at home.  Denies pain. In particular denies chest pain or shortness of breath. No recent coughor recurrence of her pneumonia symptoms.  Past Medical History:  Diagnosis Date  . Anxiety   . Arthritis    knees  . Coronary artery disease   . Depression   . Fatty liver   . History of nuclear stress test 12/16/2011   exercise myoview; normal images with 2-21mm ST-segment depression - subsequent cath revelaed subtotally occluded small 2nd marginal branch & 75% PDA lesion, normal LV function  . Hypertension   . Hypothyroidism   . Neuropathy    in feet  . OSA on CPAP    AHI = 44 (per patient) setting 13 per pt  . Personal history of kidney stones   . Primary localized osteoarthritis of left knee 04/24/2015  . Primary localized osteoarthritis of right knee 07/02/2015  . Tobacco abuse   . Type 2 diabetes mellitus (HCC)    insulin pump    Patient Active Problem List   Diagnosis Date Noted  . Diarrhea 11/12/2016  . Sepsis ([place]) 10/06/2016  . Diabetes mellitus due to underlying condition, uncontrolled ([place])  10/06/2016  . Primary localized osteoarthritis of right knee 07/02/2015  . S/P right unicompartmental knee replacement 07/02/2015  . Infection of prosthetic left knee joint ([place]) 05/13/2015  . Primary localized osteoarthritis of left knee 04/24/2015  . S/P left unicompartmental knee replacement 04/24/2015  . Hyperlipidemia 04/03/2015  . Pelvic mass in female 02/17/2015  . Mass, ovarian 02/09/2015  . CAD (coronary artery disease) 11/05/2012  . Obesity 11/05/2012  . Tobacco abuse 11/05/2012  . OSA on CPAP 11/05/2012  . HTN (hypertension) 11/05/2012  . DM (diabetes mellitus) type 2, uncontrolled, with ketoacidosis ([place]) 11/05/2012    Past Surgical History:  Procedure Laterality Date  . 3rd right toe amputation  02/2016  . AMPUTATION Right 12/22/2014   Procedure: RIGHT 2ND TOE AMPUTATION;  Surgeon: [name], MD;  Location: [facility];  Service: Orthopedics;  Laterality: Right;  . BREAST ENHANCEMENT SURGERY Bilateral   . CARDIAC CATHETERIZATION  01/04/2012   subtotally occluded small 2nd marginal branch & 75% PDA lesion, normal LV function  . CATARACT EXTRACTION Bilateral    with lens implants  . CHOLECYSTECTOMY  2008  . COLONOSCOPY    . COLONOSCOPY WITH PROPOFOL N/A 05/12/2016   Procedure: COLONOSCOPY WITH PROPOFOL;  Surgeon: Juanita Craver, MD;  Location: [facility];  Service: Endoscopy;  Laterality: N/A;  . I&D KNEE WITH POLY EXCHANGE Left 05/13/2015   Procedure: IRRIGATION AND DEBRIDEMENT KNEE WITH POLY EXCHANGE;  Surgeon: Marchia Bond, MD;  Location: [facility];  Service: Orthopedics;  Laterality: Left;  . LEFT  HEART CATHETERIZATION WITH CORONARY ANGIOGRAM N/A 01/04/2012   Procedure: LEFT HEART CATHETERIZATION WITH CORONARY ANGIOGRAM;  Surgeon: Lorretta Harp, MD;  Location: [facility] CATH LAB;  Service: Cardiovascular;  Laterality: N/A;  . PARTIAL KNEE ARTHROPLASTY Left 04/24/2015   Procedure: LEFT UNICOMPARTMENTAL KNEE ARTHROPLASTY ;  Surgeon: Marchia Bond, MD;  Location: [facility];  Service: Orthopedics;  Laterality: Left;  . PARTIAL KNEE ARTHROPLASTY Right 07/02/2015   Procedure: RIGHT UNI KNEE ARTHROPLASTY;  Surgeon: Marchia Bond, MD;  Location: [facility];  Service: Orthopedics;  Laterality: Right;  ANESTHESIA:  GENERAL, PRE/POST OP FEMORAL NERVE  . POSTERIOR CERVICAL FUSION/FORAMINOTOMY  1990  . ROBOTIC ASSISTED TOTAL HYSTERECTOMY WITH BILATERAL SALPINGO OOPHERECTOMY Bilateral 02/17/2015   Procedure: ROBOTIC ASSISTED TOTAL HYSTERECTOMY WITH BILATERAL SALPINGO OOPHORECTOMY;  Surgeon: Everitt Amber, MD;  Location: [facility];  Service: Gynecology;  Laterality: Bilateral;    OB History    No data available       Home Medications    Prior to Admission medications   Medication Sig Start Date End Date Taking? Authorizing Provider  ACCU-CHEK AVIVA PLUS test strip  12/19/14   [provider]  ALPRAZolam Duanne Moron) 0.5 MG tablet Take 0.5 mg by mouth 3 (three) times daily as needed for anxiety.  05/07/15   [provider]  alum & mag hydroxide-simeth (MAALOX/MYLANTA) 200-200-20 MG/5ML suspension Take 15 mLs by mouth every 4 (four) hours as needed for indigestion or heartburn. 10/08/16   Aline August, MD  atorvastatin (LIPITOR) 20 MG tablet Take 20 mg by mouth daily. 10/12/14   [provider]  buPROPion (WELLBUTRIN SR) 100 MG 12 hr tablet Take 100 mg by mouth every 12 (twelve) hours. 10/06/16   [provider]  Delafloxacin Meglumine (BAXDELA) 450 MG TABS Take 1 tablet by mouth 2 (two) times daily. 10/17/16   Edrick Kins, DPM  escitalopram (LEXAPRO) 20 MG tablet Take 20 mg by mouth daily.     [provider]  gabapentin (NEURONTIN) 300 MG capsule Take 300 mg by mouth 3 (three) times daily. 10/12/14   [provider]  Insulin Human (INSULIN PUMP) SOLN Inject 1 each into the skin continuous. Humalog - basal rate 3.2 Units/hr 10/08/16   Aline August, MD  levothyroxine (SYNTHROID, LEVOTHROID) 125 MCG tablet  Take 125 mcg by mouth daily. 10/06/16   [provider]  losartan-hydrochlorothiazide (HYZAAR) 100-12.5 MG tablet Take 1 tablet by mouth daily. 10/27/16   [provider]  NOVOLOG 100 UNIT/ML injection UTD INJ UP TO A MAX OF 122 UNITS [place] PER DAY VIA INSULIN PUMP 10/19/16   [provider]  ondansetron (ZOFRAN) 4 MG tablet Take 1 tablet (4 mg total) by mouth every 6 (six) hours as needed for nausea. 10/08/16   Aline August, MD  OVER THE COUNTER MEDICATION Pt uses CPAP machine daily.    [provider]  silver sulfADIAZINE (SILVADENE) 1 % cream Apply 1 application topically daily. 10/11/16   Tuchman, Leslye Peer, DPM  sulfamethoxazole-trimethoprim (BACTRIM DS) 800-160 MG tablet Take 1 tablet by mouth 2 (two) times daily. 10/11/16   Tuchman, Richard C, DPM  valsartan-hydrochlorothiazide (DIOVAN-HCT) 80-12.5 MG per tablet Take 1 tablet by mouth daily.    [provider]  Vitamin D, Ergocalciferol, (DRISDOL) 50000 units CAPS capsule Take 50,000 Units by mouth every Saturday.  01/01/16   [provider]    Family History Family History  Problem Relation Age of Onset  . Stroke Mother   . Hypertension Mother   .  Diabetes Mother   . Alzheimer's disease Mother   . Diabetes Father   . COPD Father        vent-dependent, MODS  . Hypertension Sister   . Mental illness Sister        borderline personality d/o  . Mental illness Sister        schizoeffective d/o  . Diabetes Sister     Social History Social History  Substance Use Topics  . Smoking status: Current Every Day Smoker    Packs/day: 1.00    Years: 25.00    Types: Cigarettes  . Smokeless tobacco: Never Used  . Alcohol use No     Allergies   Penicillins   Review of Systems Review of Systems  Constitutional: Positive for diaphoresis and fatigue. Negative for appetite change, chills and fever.  HENT: Negative for mouth sores, sore throat and trouble swallowing.   Eyes: Negative for  visual disturbance.  Respiratory: Negative for cough, chest tightness, shortness of breath and wheezing.   Cardiovascular: Negative for chest pain.  Gastrointestinal: Positive for diarrhea, nausea and vomiting. Negative for abdominal distention and abdominal pain.  Endocrine: Negative for polydipsia, polyphagia and polyuria.  Genitourinary: Negative for dysuria, frequency and hematuria.  Musculoskeletal: Negative for gait problem.  Skin: Negative for color change, pallor and rash.  Neurological: Positive for weakness. Negative for dizziness, syncope, light-headedness and headaches.  Hematological: Does not bruise/bleed easily.  Psychiatric/Behavioral: Negative for behavioral problems and confusion.     Physical Exam Updated Vital Signs BP (!) 124/46   Pulse 77   Temp (S) 98.3 F (36.8 C) (Oral)   Resp (!) 23   Ht 5\' 7"  (1.702 m)   Wt 120.2 kg (265 lb)   SpO2 96%   BMI 41.50 kg/m   Physical Exam  Constitutional: She is oriented to person, place, and time.  , diaphoretic 60 female. Awake. Eyes closed. Awaken to voice. She follows conversation.  HENT:  Oropharynx pink. The hemorrhage dry. No conjunctival injection. Conjunctiva not pale. No scleral icterus.  Eyes: Pupils are equal, round, and reactive to light. Conjunctivae are normal.  Neck: Normal range of motion.  Cardiovascular: Normal rate.   Sinus rhythm. Rate of 90. Systolic pressure 60.  Pulmonary/Chest: Effort normal and breath sounds normal.  Hypoxemia. Not tachypneic. No increased work of breathing. Clear lungs.  Abdominal:  Obese. Soft. Hyperactive bowel sounds. No peritoneal irritation.  Musculoskeletal:  No peripheral edema.  Neurological: She is alert and oriented to person, place, and time.  No localizing weakness.  Skin: Skin is warm. There is erythema.  Erythema of her chest and upper arms. No organized rash. No petechiae. Left foot without ulcerations. Right foot shows dry ulcerations of her great toe, and  right MTP plantar. No active erythema swelling.  Psychiatric: She has a normal mood and affect.     ED Treatments / Results  Labs (all labs ordered are listed, but only abnormal results are displayed) Labs Reviewed  CBC WITH DIFFERENTIAL/PLATELET - Abnormal; Notable for the following:       Result Value   WBC 15.4 (*)    Neutro Abs 11.8 (*)    All other components within normal limits  COMPREHENSIVE METABOLIC PANEL - Abnormal; Notable for the following:    Glucose, Bld 408 (*)    BUN 24 (*)    Creatinine, Ser 1.58 (*)    GFR calc non Af Amer 34 (*)    GFR calc Af Amer 39 (*)  All other components within normal limits  BLOOD GAS, ARTERIAL - Abnormal; Notable for the following:    pH, Arterial 7.313 (*)    pO2, Arterial 81.2 (*)    Acid-base deficit 6.1 (*)    All other components within normal limits  LACTIC ACID, PLASMA - Abnormal; Notable for the following:    Lactic Acid, Venous 2.4 (*)    All other components within normal limits  I-STAT CG4 LACTIC ACID, ED - Abnormal; Notable for the following:    Lactic Acid, Venous 6.53 (*)    All other components within normal limits  I-STAT CHEM 8, ED - Abnormal; Notable for the following:    BUN 25 (*)    Creatinine, Ser 1.40 (*)    Glucose, Bld 395 (*)    All other components within normal limits  CULTURE, BLOOD (ROUTINE X 2)  CULTURE, BLOOD (ROUTINE X 2)  URINE CULTURE  C DIFFICILE QUICK SCREEN W PCR REFLEX  MAGNESIUM  LIPASE, BLOOD  URINALYSIS, ROUTINE W REFLEX MICROSCOPIC  I-STAT TROPONIN, ED    EKG  EKG Interpretation None       Radiology Ct Abdomen Pelvis Wo Contrast  Result Date: 11/12/2016 CLINICAL DATA:  Nausea and vomiting. Pt had nausea and vomiting that started this morning. Pt does have insulin pump that was left at home. CBG was 452 upon [facility] arrival. Pt vomited 3x while in truck and had one episode of diarrhea upon arrival. EXAM: CT ABDOMEN AND PELVIS WITHOUT CONTRAST TECHNIQUE: Multidetector CT  imaging of the abdomen and pelvis was performed following the standard protocol without IV contrast. COMPARISON:  CT 10/30/2014 FINDINGS: Lower chest: Mild linear atelectasis at the lung bases. 7 mm nodule in the RIGHT lower lobe along the pleural surface (image number 6, series 6). Hepatobiliary: No focal hepatic lesion. Postcholecystectomy. No biliary dilatation. Pancreas: Pancreas is normal. No ductal dilatation. No pancreatic inflammation. Spleen: Normal spleen Adrenals/urinary tract: Adrenal glands and kidneys are normal. The ureters and bladder normal. Stomach/Bowel: Stomach, small-bowel cecum normal. Colon is normal. Rectum normal. Vascular/Lymphatic: Abdominal aorta is normal caliber with atherosclerotic calcification. There is no retroperitoneal or periportal lymphadenopathy. No pelvic lymphadenopathy. Reproductive: Post hysterectomy. Other: Trace free fluid in the posterior cul-de-sac (image 79, series 2). Musculoskeletal: No aggressive osseous lesion. IMPRESSION: 1. No evidence of abdominopelvic infection. 2. Trace free fluid in the pelvis. 3. Mild basilar atelectasis. 4. RIGHT lower lobe lobe pulmonary nodule. Non-contrast chest CT at 6-12 months is recommended. If the nodule is stable at time of repeat CT, then future CT at 18-24 months (from today's scan) is considered optional for low-risk patients, but is recommended for high-risk patients. This recommendation follows the consensus statement: Guidelines for Management of Incidental Pulmonary Nodules Detected on CT Images: From the Fleischner Society 2017; Radiology 2017; 284:228-243. Electronically Signed   By: Suzy Bouchard M.D.   On: 11/12/2016 19:42   Dg Chest Port 1 View  Result Date: 11/12/2016 CLINICAL DATA:  Nausea and vomiting. EXAM: PORTABLE CHEST 1 VIEW COMPARISON:  October 07, 2016 FINDINGS: Haziness over the left chest is likely due to patient rotation and overlapping soft tissues. The heart, hila, mediastinum, lungs, and pleura are  otherwise normal. IMPRESSION: No active disease. Electronically Signed   By: Dorise Bullion III M.D   On: 11/12/2016 16:16    Procedures Procedures (including critical care time)  Medications Ordered in ED Medications  sodium chloride 0.9 % bolus 1,000 mL (0 mLs Intravenous Stopped 11/12/16 1643)    And  sodium chloride 0.9 % bolus 1,000 mL (0 mLs Intravenous Stopped 11/12/16 1646)    And  sodium chloride 0.9 % bolus 1,000 mL (0 mLs Intravenous Stopped 11/12/16 1749)    And  sodium chloride 0.9 % bolus 1,000 mL (0 mLs Intravenous Stopped 11/12/16 1829)  levofloxacin (LEVAQUIN) IVPB 750 mg (0 mg Intravenous Stopped 11/12/16 1711)  aztreonam (AZACTAM) 2 g in dextrose 5 % 50 mL IVPB (0 g Intravenous Stopped 11/12/16 1725)  vancomycin (VANCOCIN) IVPB 1000 mg/200 mL premix (0 mg Intravenous Stopped 11/12/16 1829)     Initial Impression / Assessment and Plan / ED Course  I have reviewed the triage vital signs and the nursing notes.  Pertinent labs & imaging results that were available during my care of the patient were reviewed by me and considered in my medical decision making (see chart for details).    Temp rectally is 94. Sepsis protocol initiated. At risk for C. Difficile with recent hospitalization for sepsis and antibiotics. Plan aggressive fluids, labs, repeat evaluation after the above studies.  After 4 L fluid and antibiotic recheck lactate is improving 2.4. The amount of emesis diarrhea seems out of proportion for her low pressures. This may be indeed be frank sepsis. However, her pressures have improved. I 130s and perfusing well. Continues to mentate well. CT abdomen does not show acute process. Chest x-ray does not show obvious infectious source. Urine does not appear infected. I discussed the case with Dr. Jaclyn Shaggy she will see the patient is planning admission. Patient has not been able to produce stool for C. Difficile testing. She was dehydrated risk because of her recent inpatient  IV, and oral outpatient antibiotic treatment.  Final Clinical Impressions(s) / ED Diagnoses   Final diagnoses:  Sepsis, due to unspecified organism ([place])  Gastroenteritis  Hypothermia, initial encounter    New Prescriptions New Prescriptions   No medications on file     Tanna Furry, MD 11/12/16 2040

## 2016-11-12 NOTE — Progress Notes (Signed)
A consult was received from an ED physician for Vanc/Levaquin/Aztreonam per pharmacy dosing.  The patient's profile has been reviewed for ht/wt/allergies/indication/available labs.   A one time order has been placed for Vanc 1gm, Aztreonam 2gm, levaquin 750mg .  Further antibiotics/pharmacy consults should be ordered by admitting physician if indicated.        Alternate abx chosen d/t PCN allergy, however please note patient has received cephalosporins during previous health-system visits.                   Thank you, Netta Cedars, PharmD, BCPS 11/12/2016@3 :37 PM

## 2016-11-13 ENCOUNTER — Inpatient Hospital Stay (HOSPITAL_COMMUNITY): Payer: BLUE CROSS/BLUE SHIELD

## 2016-11-13 DIAGNOSIS — I2510 Atherosclerotic heart disease of native coronary artery without angina pectoris: Secondary | ICD-10-CM

## 2016-11-13 LAB — COMPREHENSIVE METABOLIC PANEL
ALT: 15 U/L (ref 14–54)
AST: 11 U/L — AB (ref 15–41)
Albumin: 3.4 g/dL — ABNORMAL LOW (ref 3.5–5.0)
Alkaline Phosphatase: 103 U/L (ref 38–126)
Anion gap: 6 (ref 5–15)
BUN: 21 mg/dL — AB (ref 6–20)
CALCIUM: 8.7 mg/dL — AB (ref 8.9–10.3)
CHLORIDE: 111 mmol/L (ref 101–111)
CO2: 21 mmol/L — AB (ref 22–32)
CREATININE: 1.06 mg/dL — AB (ref 0.44–1.00)
GFR calc Af Amer: >60 mL/min (ref >60)
GFR, EST NON AFRICAN AMERICAN: 55 mL/min — AB (ref >60)
GLUCOSE: 180 mg/dL — AB (ref 65–99)
POTASSIUM: 3.8 mmol/L (ref 3.5–5.1)
SODIUM: 138 mmol/L (ref 135–145)
Total Bilirubin: 0.5 mg/dL (ref 0.3–1.2)
Total Protein: 6.6 g/dL (ref 6.5–8.1)

## 2016-11-13 LAB — ECHOCARDIOGRAM COMPLETE
Height: 67 in
WEIGHTICAEL: 4144 [oz_av]

## 2016-11-13 LAB — GLUCOSE, CAPILLARY
GLUCOSE-CAPILLARY: 158 mg/dL — AB (ref 65–99)
Glucose-Capillary: 137 mg/dL — ABNORMAL HIGH (ref 65–99)
Glucose-Capillary: 147 mg/dL — ABNORMAL HIGH (ref 65–99)

## 2016-11-13 LAB — CBC
HCT: 34.5 % — ABNORMAL LOW (ref 36.0–46.0)
HEMOGLOBIN: 11.5 g/dL — AB (ref 12.0–15.0)
MCH: 31.9 pg (ref 26.0–34.0)
MCHC: 33.3 g/dL (ref 30.0–36.0)
MCV: 95.6 fL (ref 78.0–100.0)
PLATELETS: 194 10*3/uL (ref 150–400)
RBC: 3.61 MIL/uL — ABNORMAL LOW (ref 3.87–5.11)
RDW: 14 % (ref 11.5–15.5)
WBC: 10.8 10*3/uL — ABNORMAL HIGH (ref 4.0–10.5)

## 2016-11-13 LAB — TROPONIN I
Troponin I: 0.28 ng/mL (ref <0.03)
Troponin I: 0.23 ng/mL (ref <0.03)
Troponin I: 0.17 ng/mL (ref <0.03)

## 2016-11-13 LAB — LACTIC ACID, PLASMA: Lactic Acid, Venous: 1.9 mmol/L (ref 0.5–1.9)

## 2016-11-13 LAB — PROCALCITONIN: Procalcitonin: 0.45 ng/mL

## 2016-11-13 LAB — GASTROINTESTINAL PANEL BY PCR, STOOL (REPLACES STOOL CULTURE)

## 2016-11-13 LAB — CBG MONITORING, ED
GLUCOSE-CAPILLARY: 150 mg/dL — AB (ref 65–99)
Glucose-Capillary: 180 mg/dL — ABNORMAL HIGH (ref 65–99)

## 2016-11-13 LAB — TSH: TSH: 4.296 u[IU]/mL (ref 0.350–4.500)

## 2016-11-13 LAB — APTT: APTT: 29 s (ref 24–36)

## 2016-11-13 LAB — HEMOGLOBIN A1C
Hgb A1c MFr Bld: 8 % — ABNORMAL HIGH (ref 4.8–5.6)
Mean Plasma Glucose: 182.9 mg/dL

## 2016-11-13 LAB — C DIFFICILE QUICK SCREEN W PCR REFLEX
C DIFFICILE (CDIFF) INTERP: NOT DETECTED
C Diff antigen: NEGATIVE
C Diff toxin: NEGATIVE

## 2016-11-13 LAB — PROTIME-INR
INR: 1.01
Prothrombin Time: 13.2 seconds (ref 11.4–15.2)

## 2016-11-13 LAB — PHOSPHORUS: PHOSPHORUS: 2.8 mg/dL (ref 2.5–4.6)

## 2016-11-13 LAB — MAGNESIUM: Magnesium: 2 mg/dL (ref 1.7–2.4)

## 2016-11-13 MED ORDER — ONDANSETRON HCL 4 MG/2ML IJ SOLN: 4.0000 mg | Freq: Four times a day (QID) | INTRAMUSCULAR | Status: DC | PRN

## 2016-11-13 MED ORDER — ACETAMINOPHEN 650 MG RE SUPP: 650.0000 mg | Freq: Four times a day (QID) | RECTAL | Status: DC | PRN

## 2016-11-13 MED ORDER — HYDROCODONE-ACETAMINOPHEN 5-325 MG PO TABS: 1.0000 | ORAL_TABLET | ORAL | Status: DC | PRN

## 2016-11-13 MED ORDER — LEVOTHYROXINE SODIUM 125 MCG PO TABS
125.0000 ug | ORAL_TABLET | Freq: Every day | ORAL | Status: DC
  Administered 2016-11-13 – 2016-11-14 (×2): 125 ug via ORAL
  Filled 2016-11-13 (×2): qty 1

## 2016-11-13 MED ORDER — SODIUM CHLORIDE 0.9 % IV SOLN
INTRAVENOUS | Status: AC
  Administered 2016-11-13: 01:00:00 via INTRAVENOUS

## 2016-11-13 MED ORDER — ONDANSETRON HCL 4 MG PO TABS: 4.0000 mg | ORAL_TABLET | Freq: Four times a day (QID) | ORAL | Status: DC | PRN

## 2016-11-13 MED ORDER — GABAPENTIN 300 MG PO CAPS
300.0000 mg | ORAL_CAPSULE | Freq: Three times a day (TID) | ORAL | Status: DC
  Administered 2016-11-13 – 2016-11-14 (×4): 300 mg via ORAL
  Filled 2016-11-13 (×4): qty 1

## 2016-11-13 MED ORDER — ENOXAPARIN SODIUM 60 MG/0.6ML ~~LOC~~ SOLN
60.0000 mg | Freq: Every day | SUBCUTANEOUS | Status: DC
  Administered 2016-11-13: 60 mg via SUBCUTANEOUS
  Filled 2016-11-13 (×2): qty 0.6

## 2016-11-13 MED ORDER — ATORVASTATIN CALCIUM 20 MG PO TABS
20.0000 mg | ORAL_TABLET | Freq: Every day | ORAL | Status: DC
  Administered 2016-11-13 – 2016-11-14 (×2): 20 mg via ORAL
  Filled 2016-11-13 (×2): qty 2
  Filled 2016-11-13 (×2): qty 1

## 2016-11-13 MED ORDER — BUPROPION HCL ER (SR) 100 MG PO TB12
100.0000 mg | ORAL_TABLET | Freq: Two times a day (BID) | ORAL | Status: DC
  Administered 2016-11-13 – 2016-11-14 (×3): 100 mg via ORAL
  Filled 2016-11-13 (×4): qty 1

## 2016-11-13 MED ORDER — ESCITALOPRAM OXALATE 20 MG PO TABS
20.0000 mg | ORAL_TABLET | Freq: Every day | ORAL | Status: DC
  Administered 2016-11-13 – 2016-11-14 (×2): 20 mg via ORAL
  Filled 2016-11-13 (×2): qty 1

## 2016-11-13 MED ORDER — SODIUM CHLORIDE 0.45 % IV SOLN
INTRAVENOUS | Status: AC
  Administered 2016-11-13: 13:00:00 via INTRAVENOUS

## 2016-11-13 MED ORDER — ACETAMINOPHEN 325 MG PO TABS: 650.0000 mg | ORAL_TABLET | Freq: Four times a day (QID) | ORAL | Status: DC | PRN

## 2016-11-13 MED ORDER — ALPRAZOLAM 0.5 MG PO TABS
0.5000 mg | ORAL_TABLET | Freq: Three times a day (TID) | ORAL | Status: DC | PRN
  Administered 2016-11-13: 0.5 mg via ORAL
  Filled 2016-11-13: qty 1

## 2016-11-13 MED ORDER — INSULIN PUMP
SUBCUTANEOUS | Status: DC
  Administered 2016-11-13: 20:00:00 via SUBCUTANEOUS
  Administered 2016-11-13: 2 via SUBCUTANEOUS
  Administered 2016-11-13: 2.7 via SUBCUTANEOUS
  Administered 2016-11-13: 9.7 via SUBCUTANEOUS
  Administered 2016-11-14 (×2): via SUBCUTANEOUS
  Filled 2016-11-13: qty 1

## 2016-11-13 NOTE — Progress Notes (Signed)
Report given to [place], RN on 5E. Patient transferring to room 1511.

## 2016-11-13 NOTE — Progress Notes (Signed)
PROGRESS NOTE    Shauntee Karp  TWS:[id number] DOB: 01-09-1956 DOA: 11/12/2016 PCP: Burnard Bunting, MD    Brief Narrative: [name] is a 61 y.o. female with medical history significant of diabetes mellitus type 2 on insulin pump, obstructive sleep apnea, chronic bilateral foot ulcers secondary to neuropathy status post right third toe amputation in January 2018, hypertension, hyperlipidemia,, coronary artery disease, osteoarthritis and hypothyroidism who presents with ongoing nausea and vomiting along with diarrhea from last 2-3 weeks. She has a history of being on oral antibiotics and account of recent sepsis with pneumonia and right great toe cellulitis on August of this year. She has been on Bactrim DS recently at the recommendation of her podiatrist who has been following her diabetic foot ulcers. She was noted to have significantly elevated blood glucose readings of 452 during admission. She was initially noted to have a troponin elevation for which cardiology was consulted, but the patient did not have any chest pain, shortness of breath or EKG changes concerning for acute coronary syndrome. At this point, troponins appear to be downtrending and cardiology has signed off.  This morning patient states that she is overall feeling much better and is tolerating her diet. She actually denies any symptomatic complaints or concerns and states that her diarrhea has also improved. Stool studies are negative for C. difficile but gastrointestinal panel is currently pending.  Assessment & Plan:   Active Problems:   CAD (coronary artery disease)   OSA on CPAP   HTN (hypertension)   DM (diabetes mellitus) type 2, uncontrolled, with ketoacidosis (HCC)   Hyperlipidemia   Sepsis ([place])   Diarrhea   AKI (acute kidney injury) ([place])   Severe sepsis of unclear etiology, but likely acute gastroenteritis-improving -Continue on IV fluid for now -Continue on IV Levaquin and DC vancomycin -Blood  cultures and gastrointestinal panel pending -Leukocytosis as well as lactic acidosis is improved  Type 2 diabetes on insulin pump with severe hyperglycemia-resolved -Continue insulin pump management per patient -Follow CBG -History of diabetic foot ulcer which has been treated and is currently improved  AKI likely prerenal; related to above-improving -Avoid nephrotoxic agents -Continue IV fluid -A.m. Labs  CAD with noted troponin elevation -Likely an STEMI type II related to above -Cardiology has evaluated and has signed off  OSA on CPAP  Dyslipidemia -Continue home medications   DVT prophylaxis: Lovenox Code Status: Full Family Communication: w/ husband at bedside Disposition Plan: Home by am likely   Consultants:   Cardiology Dr. Acie Fredrickson; signed off   Procedures: None   Antimicrobials: IV Levaquin and Vancomycin started on 9/22. DC vancomycin 9/23.  Objective: Vitals:   11/13/16 0706 11/13/16 0753 11/13/16 0758 11/13/16 0851  BP: (!) 144/77 (!) 142/47  (!) 149/54  Pulse: 78 76 82 72  Resp: (!) 21 16 (!) 24 20  Temp:    (!) 97.5 F (36.4 C)  TempSrc:    Oral  SpO2: 96% 96% 98% 98%  Weight:    117.5 kg (259 lb)  Height:    5\' 7"  (1.702 m)    Intake/Output Summary (Last 24 hours) at 11/13/16 1218 Last data filed at 11/13/16 1014  Gross per 24 hour  Intake             5190 ml  Output                0 ml  Net             5190 ml  Filed Weights   11/12/16 2006 11/13/16 0851  Weight: 120.2 kg (265 lb) 117.5 kg (259 lb)    Examination:  General exam: Appears calm and comfortable  Respiratory system: Clear to auscultation. Respiratory effort normal. Cardiovascular system: S1 & S2 heard, RRR. No JVD, murmurs, rubs, gallops or clicks. No pedal edema. Gastrointestinal system: Abdomen is nondistended, soft and nontender. No organomegaly or masses felt. Normal bowel sounds heard. Central nervous system: Alert and oriented. No focal neurological  deficits. Extremities: Symmetric 5 x 5 power. Skin: No rashes, lesions or ulcers Psychiatry: Judgement and insight appear normal. Mood & affect appropriate.     Data Reviewed: I have personally reviewed following labs and imaging studies  CBC:  Recent Labs Lab 11/12/16 1550 11/12/16 1603 11/13/16 0450  WBC 15.4*  --  10.8*  NEUTROABS 11.8*  --   --   HGB 13.9 14.3 11.5*  HCT 42.4 42.0 34.5*  MCV 96.1  --  95.6  PLT 255  --  683   Basic Metabolic Panel:  Recent Labs Lab 11/12/16 1550 11/12/16 1603 11/13/16 0450  NA 137 139 138  K 4.3 4.1 3.8  CL 104 104 111  CO2 22  --  21*  GLUCOSE 408* 395* 180*  BUN 24* 25* 21*  CREATININE 1.58* 1.40* 1.06*  CALCIUM 9.5  --  8.7*  MG 2.0  --  2.0  PHOS  --   --  2.8   GFR: Estimated Creatinine Clearance: 73 mL/min (A) (by C-G formula based on SCr of 1.06 mg/dL (H)). Liver Function Tests:  Recent Labs Lab 11/12/16 1550 11/13/16 0450  AST 17 11*  ALT 17 15  ALKPHOS 120 103  BILITOT 0.6 0.5  PROT 7.4 6.6  ALBUMIN 3.7 3.4*    Recent Labs Lab 11/12/16 1519  LIPASE 16   No results for input(s): AMMONIA in the last 168 hours. Coagulation Profile:  Recent Labs Lab 11/13/16 0106  INR 1.01   Cardiac Enzymes:  Recent Labs Lab 11/13/16 0106 11/13/16 0649  TROPONINI 0.28* 0.23*   BNP (last 3 results) No results for input(s): PROBNP in the last 8760 hours. HbA1C: No results for input(s): HGBA1C in the last 72 hours. CBG:  Recent Labs Lab 11/12/16 2127 11/13/16 0459 11/13/16 0740 11/13/16 1211  GLUCAP 254* 180* 150* 137*   Lipid Profile: No results for input(s): CHOL, HDL, LDLCALC, TRIG, CHOLHDL, LDLDIRECT in the last 72 hours. Thyroid Function Tests:  Recent Labs  11/13/16 0450  TSH 4.296   Anemia Panel: No results for input(s): VITAMINB12, FOLATE, FERRITIN, TIBC, IRON, RETICCTPCT in the last 72 hours. Sepsis Labs:  Recent Labs Lab 11/12/16 1603 11/12/16 1903 11/13/16 0048 11/13/16 0106   PROCALCITON  --   --   --  0.45  LATICACIDVEN 6.53* 2.4* 1.9  --     Recent Results (from the past 240 hour(s))  C difficile quick scan w PCR reflex     Status: None   Collection Time: 11/13/16 12:12 AM  Result Value Ref Range Status   C Diff antigen NEGATIVE NEGATIVE Final   C Diff toxin NEGATIVE NEGATIVE Final   C Diff interpretation No C. difficile detected.  Final         Radiology Studies: Ct Abdomen Pelvis Wo Contrast  Result Date: 11/12/2016 CLINICAL DATA:  Nausea and vomiting. Pt had nausea and vomiting that started this morning. Pt does have insulin pump that was left at home. CBG was 452 upon [facility] arrival. Pt vomited 3x while  in truck and had one episode of diarrhea upon arrival. EXAM: CT ABDOMEN AND PELVIS WITHOUT CONTRAST TECHNIQUE: Multidetector CT imaging of the abdomen and pelvis was performed following the standard protocol without IV contrast. COMPARISON:  CT 10/30/2014 FINDINGS: Lower chest: Mild linear atelectasis at the lung bases. 7 mm nodule in the RIGHT lower lobe along the pleural surface (image number 6, series 6). Hepatobiliary: No focal hepatic lesion. Postcholecystectomy. No biliary dilatation. Pancreas: Pancreas is normal. No ductal dilatation. No pancreatic inflammation. Spleen: Normal spleen Adrenals/urinary tract: Adrenal glands and kidneys are normal. The ureters and bladder normal. Stomach/Bowel: Stomach, small-bowel cecum normal. Colon is normal. Rectum normal. Vascular/Lymphatic: Abdominal aorta is normal caliber with atherosclerotic calcification. There is no retroperitoneal or periportal lymphadenopathy. No pelvic lymphadenopathy. Reproductive: Post hysterectomy. Other: Trace free fluid in the posterior cul-de-sac (image 79, series 2). Musculoskeletal: No aggressive osseous lesion. IMPRESSION: 1. No evidence of abdominopelvic infection. 2. Trace free fluid in the pelvis. 3. Mild basilar atelectasis. 4. RIGHT lower lobe lobe pulmonary nodule. Non-contrast  chest CT at 6-12 months is recommended. If the nodule is stable at time of repeat CT, then future CT at 18-24 months (from today's scan) is considered optional for low-risk patients, but is recommended for high-risk patients. This recommendation follows the consensus statement: Guidelines for Management of Incidental Pulmonary Nodules Detected on CT Images: From the Fleischner Society 2017; Radiology 2017; 284:228-243. Electronically Signed   By: Suzy Bouchard M.D.   On: 11/12/2016 19:42   Dg Chest Port 1 View  Result Date: 11/12/2016 CLINICAL DATA:  Nausea and vomiting. EXAM: PORTABLE CHEST 1 VIEW COMPARISON:  October 07, 2016 FINDINGS: Haziness over the left chest is likely due to patient rotation and overlapping soft tissues. The heart, hila, mediastinum, lungs, and pleura are otherwise normal. IMPRESSION: No active disease. Electronically Signed   By: Dorise Bullion III M.D   On: 11/12/2016 16:16        Scheduled Meds: . atorvastatin  20 mg Oral Daily  . buPROPion  100 mg Oral Q12H  . enoxaparin (LOVENOX) injection  60 mg Subcutaneous Daily  . escitalopram  20 mg Oral Daily  . gabapentin  300 mg Oral TID  . insulin pump   Subcutaneous Q4H  . levothyroxine  125 mcg Oral QAC breakfast   Continuous Infusions: . aztreonam Stopped (11/13/16 0319)  . levofloxacin (LEVAQUIN) IV    . vancomycin       LOS: 1 day    Time spent: 30 minutes    Rodena Goldmann, MD [facility] Pager [phone]  If 7PM-7AM, please contact night-coverage [email or web address] Password [facility] 11/13/2016, 12:18 PM

## 2016-11-13 NOTE — Progress Notes (Signed)
  Echocardiogram 2D Echocardiogram has been performed.  Bobbye Charleston 11/13/2016, 3:53 PM

## 2016-11-13 NOTE — Consult Note (Signed)
Cardiology Consultation:   Patient ID: [name]; [id number]; May 12, 1955   Admit date: 11/12/2016 Date of Consult: 11/13/2016  Primary Care Provider: Burnard Bunting, MD Primary Cardiologist: Gwenlyn Found  Primary Electrophysiologist:   None    Patient Profile:   [name] is a 61 y.o. female with a hx of Diabetes mellitus, obesity, tobacco abuse, sleep apnea, coronary artery disease who is being seen today for the evaluation of elevated troponin levels in the setting of sepsis syndrome and possible DKA at the request of Dr. Manuella Ghazi. .  History of Present Illness:   [name] is a 64 female with a history of diabetes, obesity, coronary artery disease.  She presented to [facility] with nausea and vomiting starting earlier this morning. She was found have a glucose level of 452. She's been very lightheaded. She's had nausea and vomiting and diarrhea. She is hypothermic. Her blood pressures quite low. She's not had any episodes of chest pain.  She denies any syncope or presyncope. She denies any rash or skin nodules. The nausea and vomiting of been present for the past several days she denies any syncope or presyncope. She denies PND or orthopnea. She denies leg swelling.  This is her second admission in several months for sepsis syndrome. Outside of these episodes come she's not had any fevers or chills.  She denies any chest pain or shortness breath. She does not get a lot of exercise. She admits that her diet has not been good. She is needing more sweets than she should. She has been compliant with her medications.   Past Medical History:  Diagnosis Date  . Anxiety   . Arthritis    knees  . Coronary artery disease   . Depression   . Fatty liver   . History of nuclear stress test 12/16/2011   exercise myoview; normal images with 2-43mm ST-segment depression - subsequent cath revelaed subtotally occluded small 2nd marginal branch & 75% PDA lesion, normal LV  function  . Hypertension   . Hypothyroidism   . Neuropathy    in feet  . OSA on CPAP    AHI = 44 (per patient) setting 13 per pt  . Personal history of kidney stones   . Primary localized osteoarthritis of left knee 04/24/2015  . Primary localized osteoarthritis of right knee 07/02/2015  . Tobacco abuse   . Type 2 diabetes mellitus (HCC)    insulin pump    Past Surgical History:  Procedure Laterality Date  . 3rd right toe amputation  02/2016  . AMPUTATION Right 12/22/2014   Procedure: RIGHT 2ND TOE AMPUTATION;  Surgeon: [name], MD;  Location: [place];  Service: Orthopedics;  Laterality: Right;  . BREAST ENHANCEMENT SURGERY Bilateral   . CARDIAC CATHETERIZATION  01/04/2012   subtotally occluded small 2nd marginal branch & 75% PDA lesion, normal LV function  . CATARACT EXTRACTION Bilateral    with lens implants  . CHOLECYSTECTOMY  2008  . COLONOSCOPY    . COLONOSCOPY WITH PROPOFOL N/A 05/12/2016   Procedure: COLONOSCOPY WITH PROPOFOL;  Surgeon: Juanita Craver, MD;  Location: [facility];  Service: Endoscopy;  Laterality: N/A;  . I&D KNEE WITH POLY EXCHANGE Left 05/13/2015   Procedure: IRRIGATION AND DEBRIDEMENT KNEE WITH POLY EXCHANGE;  Surgeon: Marchia Bond, MD;  Location: [place];  Service: Orthopedics;  Laterality: Left;  . LEFT HEART CATHETERIZATION WITH CORONARY ANGIOGRAM N/A 01/04/2012   Procedure: LEFT HEART CATHETERIZATION WITH CORONARY ANGIOGRAM;  Surgeon: Lorretta Harp, MD;  Location:  [place] [facility];  Service: Cardiovascular;  Laterality: N/A;  . PARTIAL KNEE ARTHROPLASTY Left 04/24/2015   Procedure: LEFT UNICOMPARTMENTAL KNEE ARTHROPLASTY ;  Surgeon: Marchia Bond, MD;  Location: [place];  Service: Orthopedics;  Laterality: Left;  . PARTIAL KNEE ARTHROPLASTY Right 07/02/2015   Procedure: RIGHT UNI KNEE ARTHROPLASTY;  Surgeon: Marchia Bond, MD;  Location: [place];  Service: Orthopedics;  Laterality: Right;  ANESTHESIA:  GENERAL, PRE/POST OP  FEMORAL NERVE  . POSTERIOR CERVICAL FUSION/FORAMINOTOMY  1990  . ROBOTIC ASSISTED TOTAL HYSTERECTOMY WITH BILATERAL SALPINGO OOPHERECTOMY Bilateral 02/17/2015   Procedure: ROBOTIC ASSISTED TOTAL HYSTERECTOMY WITH BILATERAL SALPINGO OOPHORECTOMY;  Surgeon: Everitt Amber, MD;  Location: [facility];  Service: Gynecology;  Laterality: Bilateral;     Home Medications:  Prior to Admission medications   Medication Sig Start Date End Date Taking? Authorizing Provider  ACCU-CHEK AVIVA PLUS test strip  12/19/14  Yes [provider]  ALPRAZolam Duanne Moron) 0.5 MG tablet Take 0.5 mg by mouth at bedtime as needed for anxiety.  05/07/15  Yes [provider]  alum & mag hydroxide-simeth (MAALOX/MYLANTA) 200-200-20 MG/5ML suspension Take 15 mLs by mouth every 4 (four) hours as needed for indigestion or heartburn. 10/08/16  Yes Aline August, MD  atorvastatin (LIPITOR) 20 MG tablet Take 20 mg by mouth daily. 10/12/14  Yes [provider]  buPROPion (WELLBUTRIN SR) 100 MG 12 hr tablet Take 100 mg by mouth every 12 (twelve) hours. 10/06/16  Yes [provider]  escitalopram (LEXAPRO) 20 MG tablet Take 20 mg by mouth daily.   Yes [provider]  gabapentin (NEURONTIN) 100 MG capsule Take 100 mg by mouth 3 (three) times daily.   Yes [provider]  Insulin Human (INSULIN PUMP) SOLN Inject 1 each into the skin continuous. Humalog - basal rate 3.2 Units/hr Patient taking differently: Inject 1 each into the skin continuous. Novolog - basal rate 3.2 units/hr 10/08/16  Yes Alekh, Kshitiz, MD  levothyroxine (SYNTHROID, LEVOTHROID) 125 MCG tablet Take 125 mcg by mouth daily. 10/06/16  Yes [provider]  losartan-hydrochlorothiazide (HYZAAR) 100-12.5 MG tablet Take 1 tablet by mouth daily. 10/27/16  Yes [provider]  NOVOLOG 100 UNIT/ML injection UTD INJ UP TO A MAX OF 122 UNITS [place] PER DAY VIA INSULIN PUMP 10/19/16  Yes [provider]  OVER THE COUNTER  MEDICATION Pt uses CPAP machine daily.   Yes [provider]  Delafloxacin Meglumine (BAXDELA) 450 MG TABS Take 1 tablet by mouth 2 (two) times daily. Patient not taking: Reported on 11/12/2016 10/17/16   Edrick Kins, DPM  gabapentin (NEURONTIN) 300 MG capsule Take 600 mg by mouth 3 (three) times daily.  10/12/14   [provider]  ondansetron (ZOFRAN) 4 MG tablet Take 1 tablet (4 mg total) by mouth every 6 (six) hours as needed for nausea. Patient not taking: Reported on 11/12/2016 10/08/16   Aline August, MD  silver sulfADIAZINE (SILVADENE) 1 % cream Apply 1 application topically daily. Patient not taking: Reported on 11/12/2016 10/11/16   Gean Birchwood, DPM  sulfamethoxazole-trimethoprim (BACTRIM DS) 800-160 MG tablet Take 1 tablet by mouth 2 (two) times daily. Patient not taking: Reported on 11/12/2016 10/11/16   Gean Birchwood, DPM    Inpatient Medications: Scheduled Meds: . atorvastatin  20 mg Oral Daily  . buPROPion  100 mg Oral Q12H  . enoxaparin (LOVENOX) injection  60 mg Subcutaneous Daily  . escitalopram  20 mg Oral Daily  . gabapentin  300 mg Oral TID  . insulin pump   Subcutaneous Q4H  . levothyroxine  125 mcg Oral QAC breakfast   Continuous Infusions: . sodium chloride 100 mL/hr at 11/13/16 0112  . aztreonam Stopped (11/13/16 0319)  . levofloxacin (LEVAQUIN) IV    . vancomycin     PRN Meds: acetaminophen **OR** acetaminophen, ALPRAZolam, HYDROcodone-acetaminophen, ondansetron **OR** ondansetron (ZOFRAN) IV  Allergies:    Allergies  Allergen Reactions  . Penicillins Hives    Has patient had a PCN reaction causing immediate rash, facial/tongue/throat swelling, SOB or lightheadedness with hypotension: Unknown Has patient had a PCN reaction causing severe rash involving mucus membranes or skin necrosis: Unknown Has patient had a PCN reaction that required hospitalization: Unknown Has patient had a PCN reaction occurring within the last 10 years:  No If all of the above answers are "NO", then may proceed with Cephalosporin use.    Social History:   Social History   Social History  . Marital status: Married    Spouse name: N/A  . Number of children: N/A  . Years of education: 101   Occupational History  . respiratory therapist Other    [employer]   Social History Main Topics  . Smoking status: Current Every Day Smoker    Packs/day: 1.00    Years: 25.00    Types: Cigarettes  . Smokeless tobacco: Never Used  . Alcohol use No  . Drug use: No  . Sexual activity: Not on file   Other Topics Concern  . Not on file   Social History Narrative  . No narrative on file    Family History:    Family History  Problem Relation Age of Onset  . Stroke Mother   . Hypertension Mother   . Diabetes Mother   . Alzheimer's disease Mother   . Diabetes Father   . COPD Father        vent-dependent, MODS  . Hypertension Sister   . Mental illness Sister        borderline personality d/o  . Mental illness Sister        schizoeffective d/o  . Diabetes Sister      ROS:  Please see the history of present illness.  ROS     Physical Exam/Data:   Vitals:   11/13/16 0706 11/13/16 0753 11/13/16 0758 11/13/16 0851  BP: (!) 144/77 (!) 142/47  (!) 149/54  Pulse: 78 76 82 72  Resp: (!) 21 16 (!) 24 20  Temp:    (!) 97.5 F (36.4 C)  TempSrc:    Oral  SpO2: 96% 96% 98% 98%  Weight:    259 lb (117.5 kg)  Height:    5\' 7"  (1.702 m)    Intake/Output Summary (Last 24 hours) at 11/13/16 0916 Last data filed at 11/13/16 0319  Gross per 24 hour  Intake             4950 ml  Output                0 ml  Net             4950 ml   Filed Weights   11/12/16 2006 11/13/16 0851  Weight: 265 lb (120.2 kg) 259 lb (117.5 kg)   Body mass index is 40.57 kg/m.  General:  Well nourished, well developed, in no acute distress , sitting up in chair.   HEENT: normal Lymph: no adenopathy Neck: no JVD Endocrine:  No  thryomegaly Vascular:  No carotid bruits; FA pulses 2+ bilaterally without bruits  Cardiac:  normal S1, S2; RR; no murmur  Lungs:  clear to auscultation bilaterally, no wheezing, rhonchi or rales  Abd: soft, nontender, no hepatomegaly  Ext: no edema Musculoskeletal:  No deformities, BUE and BLE strength normal and equal Skin: warm and dry  Neuro:  CNs 2-12 intact, no focal abnormalities noted Psych:  Normal affect   EKG:  The EKG was personally reviewed and demonstrates:   NSR  Telemetry:  Telemetry was personally reviewed and demonstrates:    Relevant CV Studies: NSR , no ST or T wave abn.   Laboratory Data:  Chemistry Recent Labs Lab 11/12/16 1550 11/12/16 1603 11/13/16 0450  NA 137 139 138  K 4.3 4.1 3.8  CL 104 104 111  CO2 22  --  21*  GLUCOSE 408* 395* 180*  BUN 24* 25* 21*  CREATININE 1.58* 1.40* 1.06*  CALCIUM 9.5  --  8.7*  GFRNONAA 34*  --  55*  GFRAA 39*  --  >60  ANIONGAP 11  --  6     Recent Labs Lab 11/12/16 1550 11/13/16 0450  PROT 7.4 6.6  ALBUMIN 3.7 3.4*  AST 17 11*  ALT 17 15  ALKPHOS 120 103  BILITOT 0.6 0.5   Hematology Recent Labs Lab 11/12/16 1550 11/12/16 1603 11/13/16 0450  WBC 15.4*  --  10.8*  RBC 4.41  --  3.61*  HGB 13.9 14.3 11.5*  HCT 42.4 42.0 34.5*  MCV 96.1  --  95.6  MCH 31.5  --  31.9  MCHC 32.8  --  33.3  RDW 13.9  --  14.0  PLT 255  --  194   Cardiac Enzymes Recent Labs Lab 11/13/16 0106 11/13/16 0649  TROPONINI 0.28* 0.23*    Recent Labs Lab 11/12/16 1601  TROPIPOC 0.00    BNPNo results for input(s): BNP, PROBNP in the last 168 hours.  DDimer No results for input(s): DDIMER in the last 168 hours.  Radiology/Studies:  Ct Abdomen Pelvis Wo Contrast  Result Date: 11/12/2016 CLINICAL DATA:  Nausea and vomiting. Pt had nausea and vomiting that started this morning. Pt does have insulin pump that was left at home. CBG was 452 upon [facility] arrival. Pt vomited 3x while in truck and had one episode of  diarrhea upon arrival. EXAM: CT ABDOMEN AND PELVIS WITHOUT CONTRAST TECHNIQUE: Multidetector CT imaging of the abdomen and pelvis was performed following the standard protocol without IV contrast. COMPARISON:  CT 10/30/2014 FINDINGS: Lower chest: Mild linear atelectasis at the lung bases. 7 mm nodule in the RIGHT lower lobe along the pleural surface (image number 6, series 6). Hepatobiliary: No focal hepatic lesion. Postcholecystectomy. No biliary dilatation. Pancreas: Pancreas is normal. No ductal dilatation. No pancreatic inflammation. Spleen: Normal spleen Adrenals/urinary tract: Adrenal glands and kidneys are normal. The ureters and bladder normal. Stomach/Bowel: Stomach, small-bowel cecum normal. Colon is normal. Rectum normal. Vascular/Lymphatic: Abdominal aorta is normal caliber with atherosclerotic calcification. There is no retroperitoneal or periportal lymphadenopathy. No pelvic lymphadenopathy. Reproductive: Post hysterectomy. Other: Trace free fluid in the posterior cul-de-sac (image 79, series 2). Musculoskeletal: No aggressive osseous lesion. IMPRESSION: 1. No evidence of abdominopelvic infection. 2. Trace free fluid in the pelvis. 3. Mild basilar atelectasis. 4. RIGHT lower lobe lobe pulmonary nodule. Non-contrast chest CT at 6-12 months is recommended. If the nodule is stable at time of repeat CT, then future CT at 18-24 months (from today's scan) is considered optional for low-risk patients,  but is recommended for high-risk patients. This recommendation follows the consensus statement: Guidelines for Management of Incidental Pulmonary Nodules Detected on CT Images: From the Fleischner Society 2017; Radiology 2017; 284:228-243. Electronically Signed   By: Suzy Bouchard M.D.   On: 11/12/2016 19:42   Dg Chest Port 1 View  Result Date: 11/12/2016 CLINICAL DATA:  Nausea and vomiting. EXAM: PORTABLE CHEST 1 VIEW COMPARISON:  October 07, 2016 FINDINGS: Haziness over the left chest is likely due to  patient rotation and overlapping soft tissues. The heart, hila, mediastinum, lungs, and pleura are otherwise normal. IMPRESSION: No active disease. Electronically Signed   By: Dorise Bullion III M.D   On: 11/12/2016 16:16    Assessment and Plan:   1. 1. Elevated troponin level. The patient denies any cardiac symptoms. She presents with profound hyperglycemia and sepsis syndrome.  In this setting the troponin elevation is merely a marker for just how sick she is. I do not think that it indicates any cardiac issue. We know that she has coronary artery disease involving some small branches.  Given her lack of symptoms and no EKG changes, I do not think that she needs any additional workup. She may follow-up with Dr. Ferrel Logan and they can discuss any further workup if she has any further symptoms.  She needs to be more compliant with her diet. She needs to exercise regularly   We will sign off. Please call for questions   For questions or updates, please contact [place] Please consult [email or web address] for contact info under Cardiology/STEMI.   Signed, Mertie Moores, MD  11/13/2016 9:16 AM

## 2016-11-13 NOTE — ED Notes (Addendum)
Date and time results received: 11/13/16 2:29 AM (use smartphrase ".now" to insert current time)  Test: Troponin Critical Value: 0.28  Name of Provider Notified: Maudie Mercury, MD  Orders Received? Or Actions Taken?: None

## 2016-11-13 NOTE — Progress Notes (Signed)
Rx Brief note:  Lovenox  Wt=120 kg, BMI=41, CrCl~55 ml/min  Rx adjusted Lovenox to 60 mg daily (~0.5 mg/kg) in pt with BMI >30  Thanks Dorrene German 11/13/2016 3:31 AM

## 2016-11-13 NOTE — Progress Notes (Signed)
GI panel negative. Enteric precautions dced. Will c/t monitor.

## 2016-11-13 NOTE — Progress Notes (Signed)
Nutrition Brief Note  Patient identified on the Malnutrition Screening Tool (MST) Report. Spoke with Pt at bedside who reports no loss in appetite or unintentional wt loss. Pt's wt trending up within her last 15 encounters.   Wt Readings from Last 15 Encounters:  11/13/16 259 lb (117.5 kg)  10/07/16 262 lb 9.6 oz (119.1 kg)  05/12/16 252 lb (114.3 kg)  08/18/15 240 lb (108.9 kg)  07/02/15 242 lb (109.8 kg)  06/23/15 242 lb (109.8 kg)  05/13/15 245 lb (111.1 kg)  04/24/15 245 lb (111.1 kg)  04/03/15 248 lb 5 oz (112.6 kg)  03/11/15 244 lb (110.7 kg)  02/17/15 242 lb (109.8 kg)  02/09/15 242 lb 3.2 oz (109.9 kg)  01/21/15 243 lb 6 oz (110.4 kg)  12/22/14 234 lb (106.1 kg)  11/27/14 243 lb 6.4 oz (110.4 kg)    Body mass index is 40.57 kg/m. Patient meets criteria for morbid obesity based on current BMI.   Current diet order is carb modified, patient is consuming approximately 100% of meals at this time per pt reports. Labs and medications reviewed.   Albumin has a half-life of 21 days and is strongly affected by stress response and inflammatory process, therefore, do not expect to see an improvement in this lab value during acute hospitalization.  No nutrition interventions warranted at this time. If nutrition issues arise, please consult RD.   Mariana Single RD, LDN Clinical Nutrition Pager # [phone]

## 2016-11-14 ENCOUNTER — Ambulatory Visit: Payer: BLUE CROSS/BLUE SHIELD | Admitting: Podiatry

## 2016-11-14 LAB — BASIC METABOLIC PANEL
ANION GAP: 8 (ref 5–15)
BUN: 12 mg/dL (ref 6–20)
CALCIUM: 9.3 mg/dL (ref 8.9–10.3)
CO2: 21 mmol/L — AB (ref 22–32)
Chloride: 109 mmol/L (ref 101–111)
Creatinine, Ser: 0.83 mg/dL (ref 0.44–1.00)
GFR calc Af Amer: >60 mL/min (ref >60)
GLUCOSE: 105 mg/dL — AB (ref 65–99)
Potassium: 3.9 mmol/L (ref 3.5–5.1)
Sodium: 138 mmol/L (ref 135–145)

## 2016-11-14 LAB — CBC
HEMATOCRIT: 33 % — AB (ref 36.0–46.0)
Hemoglobin: 11 g/dL — ABNORMAL LOW (ref 12.0–15.0)
MCH: 31.9 pg (ref 26.0–34.0)
MCHC: 33.3 g/dL (ref 30.0–36.0)
MCV: 95.7 fL (ref 78.0–100.0)
PLATELETS: 182 10*3/uL (ref 150–400)
RBC: 3.45 MIL/uL — ABNORMAL LOW (ref 3.87–5.11)
RDW: 14 % (ref 11.5–15.5)
WBC: 8.1 10*3/uL (ref 4.0–10.5)

## 2016-11-14 LAB — GLUCOSE, CAPILLARY
GLUCOSE-CAPILLARY: 112 mg/dL — AB (ref 65–99)
GLUCOSE-CAPILLARY: 173 mg/dL — AB (ref 65–99)
Glucose-Capillary: 117 mg/dL — ABNORMAL HIGH (ref 65–99)
Glucose-Capillary: 135 mg/dL — ABNORMAL HIGH (ref 65–99)

## 2016-11-14 MED ORDER — METRONIDAZOLE 500 MG PO TABS: 500.0000 mg | ORAL_TABLET | Freq: Three times a day (TID) | ORAL | 0 refills | Status: AC

## 2016-11-14 MED ORDER — LEVOFLOXACIN 500 MG PO TABS: 500.0000 mg | ORAL_TABLET | Freq: Every day | ORAL | 0 refills | Status: AC

## 2016-11-14 NOTE — Discharge Summary (Addendum)
Physician Discharge Summary  Robert Sunga TIW:[id number] DOB: 12-26-1955 DOA: 11/12/2016  PCP: Burnard Bunting, MD  Admit date: 11/12/2016 Discharge date: 11/14/2016  Time spent: >35 minutes  Recommendations for Outpatient Follow-up:  F/u with PCP in 3-7 days as needed   Discharge Diagnoses:  Active Problems:   CAD (coronary artery disease)   OSA on CPAP   HTN (hypertension)   DM (diabetes mellitus) type 2, uncontrolled, with ketoacidosis ([place])   Hyperlipidemia   Sepsis ([place])   Diarrhea   AKI (acute kidney injury) ([place])   Discharge Condition: stable   Diet recommendation: low sodium, DM  Filed Weights   11/12/16 2006 11/13/16 0851  Weight: 120.2 kg (265 lb) 117.5 kg (259 lb)    History of present illness:  [name] Maynardis a 61 y.o.femalewith medical history significant of diabetes mellitus type 2 on insulin pump, obstructive sleep apnea, chronic bilateral foot ulcers secondary to neuropathy status post right third toe amputation in January 2018, hypertension, hyperlipidemia,, coronary artery disease, osteoarthritis and hypothyroidism who presents with ongoing nausea and vomiting along with diarrhea from last 2-3 weeks. She has a history of being on oral antibiotics and account of recent sepsis with pneumonia and right great toe cellulitis on August of this year. She has been on Bactrim DS recently at the recommendation of her podiatrist who has been following her diabetic foot ulcers. She was noted to have significantly elevated blood glucose readings of 452 during admission. She was initially noted to have a troponin elevation for which cardiology was consulted, but the patient did not have any chest pain, shortness of breath or EKG changes concerning for acute coronary syndrome. At this point, troponins appear to be downtrending and cardiology has signed off.  Hospital Course:   Severe sepsis of unclear etiology, but likely acute gastroenteritis. Significantly  improved with antibiotic treatment. Leukocytosis as well as lactic acidosis is improved. She is transitioned to oral regimen for few more days   Type 2 diabetes on insulin pump with severe hyperglycemia-resolved -Continue insulin pump management per patient -History of diabetic foot ulcer which has been treated and is currently improved.f/u with her podiatry at discharge   AKI likely prerenal; related to above-improving  CAD with noted troponin elevation -Likely an STEMI type II related to above -Cardiology has evaluated and has signed off  OSA on CPAP  Dyslipidemia -Continue home medications  Procedures:  none (i.e. Studies not automatically included, echos, thoracentesis, etc; not x-rays)  Consultations:  Cardiology   Discharge Exam: Vitals:   11/14/16 0455 11/14/16 0937  BP: 119/63 (!) 108/92  Pulse: 92 79  Resp: 20 18  Temp: 97.9 F (36.6 C) 97.8 F (36.6 C)  SpO2: 94% 99%    General: alert, no distress. Want to go home  Cardiovascular: s1,s2 rrr Respiratory: CTA BL  Discharge Instructions  Discharge Instructions    Diet - low sodium heart healthy    Complete by:  As directed    Increase activity slowly    Complete by:  As directed      Allergies as of 11/14/2016      Reactions   Penicillins Hives   Has patient had a PCN reaction causing immediate rash, facial/tongue/throat swelling, SOB or lightheadedness with hypotension: Unknown Has patient had a PCN reaction causing severe rash involving mucus membranes or skin necrosis: Unknown Has patient had a PCN reaction that required hospitalization: Unknown Has patient had a PCN reaction occurring within the last 10 years: No If all of the above  answers are "NO", then may proceed with Cephalosporin use.      Medication List    STOP taking these medications   Delafloxacin Meglumine 450 MG Tabs Commonly known as:  BAXDELA   sulfamethoxazole-trimethoprim 800-160 MG tablet Commonly known as:  BACTRIM  DS     TAKE these medications   ACCU-CHEK AVIVA PLUS test strip Generic drug:  glucose blood   ALPRAZolam 0.5 MG tablet Commonly known as:  XANAX Take 0.5 mg by mouth at bedtime as needed for anxiety.   alum & mag hydroxide-simeth 200-200-20 MG/5ML suspension Commonly known as:  MAALOX/MYLANTA Take 15 mLs by mouth every 4 (four) hours as needed for indigestion or heartburn.   atorvastatin 20 MG tablet Commonly known as:  LIPITOR Take 20 mg by mouth daily.   buPROPion 100 MG 12 hr tablet Commonly known as:  WELLBUTRIN SR Take 100 mg by mouth every 12 (twelve) hours.   escitalopram 20 MG tablet Commonly known as:  LEXAPRO Take 20 mg by mouth daily.   gabapentin 300 MG capsule Commonly known as:  NEURONTIN Take 600 mg by mouth 3 (three) times daily. What changed:  Another medication with the same name was removed. Continue taking this medication, and follow the directions you see here.   insulin pump Soln Inject 1 each into the skin continuous. Humalog - basal rate 3.2 Units/hr What changed:  additional instructions   levofloxacin 500 MG tablet Commonly known as:  LEVAQUIN Take 1 tablet (500 mg total) by mouth daily.   levothyroxine 125 MCG tablet Commonly known as:  SYNTHROID, LEVOTHROID Take 125 mcg by mouth daily.   losartan-hydrochlorothiazide 100-12.5 MG tablet Commonly known as:  HYZAAR Take 1 tablet by mouth daily.   metroNIDAZOLE 500 MG tablet Commonly known as:  FLAGYL Take 1 tablet (500 mg total) by mouth 3 (three) times daily.   NOVOLOG 100 UNIT/ML injection Generic drug:  insulin aspart UTD INJ UP TO A MAX OF 122 UNITS [place] PER DAY VIA INSULIN PUMP   ondansetron 4 MG tablet Commonly known as:  ZOFRAN Take 1 tablet (4 mg total) by mouth every 6 (six) hours as needed for nausea.   OVER THE COUNTER MEDICATION Pt uses CPAP machine daily.   silver sulfADIAZINE 1 % cream Commonly known as:  SILVADENE Apply 1 application topically daily.             Discharge Care Instructions        Start     Ordered   11/14/16 0000  Increase activity slowly     11/14/16 1122   11/14/16 0000  Diet - low sodium heart healthy     11/14/16 1122   11/14/16 0000  levofloxacin (LEVAQUIN) 500 MG tablet  Daily     11/14/16 1122   11/14/16 0000  metroNIDAZOLE (FLAGYL) 500 MG tablet  3 times daily     11/14/16 1122     Allergies  Allergen Reactions  . Penicillins Hives    Has patient had a PCN reaction causing immediate rash, facial/tongue/throat swelling, SOB or lightheadedness with hypotension: Unknown Has patient had a PCN reaction causing severe rash involving mucus membranes or skin necrosis: Unknown Has patient had a PCN reaction that required hospitalization: Unknown Has patient had a PCN reaction occurring within the last 10 years: No If all of the above answers are "NO", then may proceed with Cephalosporin use.      The results of significant diagnostics from this hospitalization (including imaging, microbiology, ancillary and laboratory)  are listed below for reference.    Significant Diagnostic Studies: Ct Abdomen Pelvis Wo Contrast  Result Date: 11/12/2016 CLINICAL DATA:  Nausea and vomiting. Pt had nausea and vomiting that started this morning. Pt does have insulin pump that was left at home. CBG was 452 upon [facility] arrival. Pt vomited 3x while in truck and had one episode of diarrhea upon arrival. EXAM: CT ABDOMEN AND PELVIS WITHOUT CONTRAST TECHNIQUE: Multidetector CT imaging of the abdomen and pelvis was performed following the standard protocol without IV contrast. COMPARISON:  CT 10/30/2014 FINDINGS: Lower chest: Mild linear atelectasis at the lung bases. 7 mm nodule in the RIGHT lower lobe along the pleural surface (image number 6, series 6). Hepatobiliary: No focal hepatic lesion. Postcholecystectomy. No biliary dilatation. Pancreas: Pancreas is normal. No ductal dilatation. No pancreatic inflammation. Spleen: Normal spleen  Adrenals/urinary tract: Adrenal glands and kidneys are normal. The ureters and bladder normal. Stomach/Bowel: Stomach, small-bowel cecum normal. Colon is normal. Rectum normal. Vascular/Lymphatic: Abdominal aorta is normal caliber with atherosclerotic calcification. There is no retroperitoneal or periportal lymphadenopathy. No pelvic lymphadenopathy. Reproductive: Post hysterectomy. Other: Trace free fluid in the posterior cul-de-sac (image 79, series 2). Musculoskeletal: No aggressive osseous lesion. IMPRESSION: 1. No evidence of abdominopelvic infection. 2. Trace free fluid in the pelvis. 3. Mild basilar atelectasis. 4. RIGHT lower lobe lobe pulmonary nodule. Non-contrast chest CT at 6-12 months is recommended. If the nodule is stable at time of repeat CT, then future CT at 18-24 months (from today's scan) is considered optional for low-risk patients, but is recommended for high-risk patients. This recommendation follows the consensus statement: Guidelines for Management of Incidental Pulmonary Nodules Detected on CT Images: From the Fleischner Society 2017; Radiology 2017; 284:228-243. Electronically Signed   By: Suzy Bouchard M.D.   On: 11/12/2016 19:42   Dg Chest Port 1 View  Result Date: 11/12/2016 CLINICAL DATA:  Nausea and vomiting. EXAM: PORTABLE CHEST 1 VIEW COMPARISON:  October 07, 2016 FINDINGS: Haziness over the left chest is likely due to patient rotation and overlapping soft tissues. The heart, hila, mediastinum, lungs, and pleura are otherwise normal. IMPRESSION: No active disease. Electronically Signed   By: Dorise Bullion III M.D   On: 11/12/2016 16:16    Microbiology: Recent Results (from the past 240 hour(s))  Culture, blood (Routine X 2) w Reflex to ID Panel     Status: None (Preliminary result)   Collection Time: 11/12/16  3:50 PM  Result Value Ref Range Status   Specimen Description BLOOD RIGHT ANTECUBITAL  Final   Special Requests   Final    BOTTLES DRAWN AEROBIC AND  ANAEROBIC Blood Culture adequate volume   Culture   Final    NO GROWTH < 24 HOURS Performed at [facility], [address] [place], [place] [address]    Report Status PENDING  Incomplete  C difficile quick scan w PCR reflex     Status: None   Collection Time: 11/13/16 12:12 AM  Result Value Ref Range Status   C Diff antigen NEGATIVE NEGATIVE Final   C Diff toxin NEGATIVE NEGATIVE Final   C Diff interpretation No C. difficile detected.  Final  Gastrointestinal Panel by PCR , Stool     Status: None   Collection Time: 11/13/16 12:48 AM  Result Value Ref Range Status   Campylobacter species NOT DETECTED NOT DETECTED Final   Plesimonas shigelloides NOT DETECTED NOT DETECTED Final   Salmonella species NOT DETECTED NOT DETECTED Final   Yersinia enterocolitica  NOT DETECTED NOT DETECTED Final   Vibrio species NOT DETECTED NOT DETECTED Final   Vibrio cholerae NOT DETECTED NOT DETECTED Final   Enteroaggregative E coli (EAEC) NOT DETECTED NOT DETECTED Final   Enteropathogenic E coli (EPEC) NOT DETECTED NOT DETECTED Final   Enterotoxigenic E coli (ETEC) NOT DETECTED NOT DETECTED Final   Shiga like toxin producing E coli (STEC) NOT DETECTED NOT DETECTED Final   Shigella/Enteroinvasive E coli (EIEC) NOT DETECTED NOT DETECTED Final   Cryptosporidium NOT DETECTED NOT DETECTED Final   Cyclospora cayetanensis NOT DETECTED NOT DETECTED Final   Entamoeba histolytica NOT DETECTED NOT DETECTED Final   Giardia lamblia NOT DETECTED NOT DETECTED Final   Adenovirus F40/41 NOT DETECTED NOT DETECTED Final   Astrovirus NOT DETECTED NOT DETECTED Final   Norovirus GI/GII NOT DETECTED NOT DETECTED Final   Rotavirus A NOT DETECTED NOT DETECTED Final   Sapovirus (I, II, IV, and V) NOT DETECTED NOT DETECTED Final     Labs: Basic Metabolic Panel:  Recent Labs Lab 11/12/16 1550 11/12/16 1603 11/13/16 0450 11/14/16 0703  NA 137 139 138 138  K 4.3 4.1 3.8 3.9  CL 104 104 111 109  CO2 22  --  21* 21*   GLUCOSE 408* 395* 180* 105*  BUN 24* 25* 21* 12  CREATININE 1.58* 1.40* 1.06* 0.83  CALCIUM 9.5  --  8.7* 9.3  MG 2.0  --  2.0  --   PHOS  --   --  2.8  --    Liver Function Tests:  Recent Labs Lab 11/12/16 1550 11/13/16 0450  AST 17 11*  ALT 17 15  ALKPHOS 120 103  BILITOT 0.6 0.5  PROT 7.4 6.6  ALBUMIN 3.7 3.4*    Recent Labs Lab 11/12/16 1519  LIPASE 16   No results for input(s): AMMONIA in the last 168 hours. CBC:  Recent Labs Lab 11/12/16 1550 11/12/16 1603 11/13/16 0450 11/14/16 0703  WBC 15.4*  --  10.8* 8.1  NEUTROABS 11.8*  --   --   --   HGB 13.9 14.3 11.5* 11.0*  HCT 42.4 42.0 34.5* 33.0*  MCV 96.1  --  95.6 95.7  PLT 255  --  194 182   Cardiac Enzymes:  Recent Labs Lab 11/13/16 0106 11/13/16 0649 11/13/16 1215  TROPONINI 0.28* 0.23* 0.17*   BNP: BNP (last 3 results) No results for input(s): BNP in the last 8760 hours.  ProBNP (last 3 results) No results for input(s): PROBNP in the last 8760 hours.  CBG:  Recent Labs Lab 11/13/16 1621 11/13/16 2106 11/14/16 0054 11/14/16 0449 11/14/16 0735  GLUCAP 158* 147* 117* 135* 112*       Signed:  Rowe Clack N  [facility] 11/14/2016, 11:23 AM   Addendum: Morbid/Severe obesity. Cont diet weigh control discussions  Oluwanifemi Petitti N

## 2016-11-14 NOTE — Progress Notes (Signed)
PT Cancellation Note  Patient Details Name: [name] MRN: [id number] DOB: 1956-01-25   Cancelled Treatment:    Reason Eval/Treat Not Completed: Other (comment)  Pt scheduled to d/c.  Spoke to Dr. Daleen Bo, attending MD, and stated pt no longer need therapy evaluation and was ok to d/c order.  Will sign off.  Galen Manila 11/14/2016, 11:40 AM

## 2016-11-14 NOTE — Care Management Note (Signed)
Case Management Note  Patient Details  Name: [name] MRN: [id number] Date of Birth: 06-06-1955  Subjective/Objective:                  61 y.o.femalewith medical history significant of diabetes mellitus type 2 on insulin pump, obstructive sleep apnea, chronic bilateral foot ulcers secondary to neuropathy status post right third toe amputation in January 2018, hypertension, hyperlipidemia,, coronary artery disease, osteoarthritis and hypothyroidism who presents with ongoing nausea and vomiting along with diarrhea from last 2-3 weeks. She has a history of being on oral antibiotics and account of recent sepsis with pneumonia and right great toe cellulitis on August of this year. She has been on Bactrim DS recently at the recommendation of her podiatrist who has been following her diabetic foot ulcers. She was noted to have significantly elevated blood glucose readings of 452 during admission. She was initially noted to have a troponin elevation for which cardiology was consulted, but the patient did not have any chest pain, shortness of breath or EKG changes concerning for acute coronary syndrome. At this point, troponins appear to be downtrending and cardiology has signed off.  This morning patient states that she is overall feeling much better and is tolerating her diet. She actually denies any symptomatic complaints or concerns and states that her diarrhea has also improved. Stool studies are negative for C. difficile but gastrointestinal panel is currently pending.  Action/Plan: Date:  November 14, 2016 Chart reviewed for concurrent status and case management needs.  Will continue to follow patient progress.  Discharge Planning: following for needs  Expected discharge date: [id number]  Velva Harman, BSN, [place], [place]    Expected Discharge Date:                  Expected Discharge Plan:     In-House Referral:     Discharge planning Services     Post Acute Care Choice:     Choice offered to:     DME Arranged:    DME Agency:     HH Arranged:    [place] Agency:     Status of Service:     If discussed at [place] of Stay Meetings, dates discussed:    Additional Comments:  Leeroy Cha, RN 11/14/2016, 10:14 AM

## 2016-11-14 NOTE — Progress Notes (Signed)
Inpatient Diabetes Program Recommendations  AACE/ADA: New Consensus Statement on Inpatient Glycemic Control (2015)  Target Ranges:  Prepandial:   less than 140 mg/dL      Peak postprandial:   less than 180 mg/dL (1-2 hours)      Critically ill patients:  140 - 180 mg/dL   Lab Results  Component Value Date   GLUCAP 173 (H) 11/14/2016   HGBA1C 8.0 (H) 11/13/2016    Review of Glycemic Control  Diabetes history: DM2 Outpatient Diabetes medications: insulin pump Current orders for Inpatient glycemic control: insulin pump  Spoke with pt regarding her glucose control. Pt states she got nauseated at home, was vomiting and having diarrhea, and blood sugars found to be in 400s. States she doesn't know what happened. Has had pump for over 10 years. See Dr. Reynaldo Minium for her diabetes.  Pump settings: Basal - 3.5 units/hour - total of 84 units Bolus - 150 mg/dL is goal. CF is 50   CHO ratio:   Pt to f/u with Dr. Reynaldo Minium on 11/15/2016. Pt contract signed for insulin pump. Pt is requesting OP Diabetes Education. Will order.  Thank you. Lorenda Peck, RD, LDN, CDE Inpatient Diabetes Coordinator [phone]

## 2016-11-14 NOTE — Progress Notes (Signed)
OT Cancellation Note  Patient Details Name: [name] MRN: [id number] DOB: April 28, 1955   Cancelled Treatment:    Reason Eval/Treat Not Completed: OT screened, no needs identified, will sign off  Spoke with PT  Regarding  No need for OT to see pt prior to DC  [facility], Mickel Baas, [place] [phone] 11/14/2016, 12:03 PM

## 2016-11-17 LAB — CULTURE, BLOOD (ROUTINE X 2)
CULTURE: NO GROWTH
SPECIAL REQUESTS: ADEQUATE

## 2016-11-21 ENCOUNTER — Ambulatory Visit (INDEPENDENT_AMBULATORY_CARE_PROVIDER_SITE_OTHER): Payer: BLUE CROSS/BLUE SHIELD | Admitting: Podiatry

## 2016-11-21 ENCOUNTER — Encounter: Payer: Self-pay | Admitting: Podiatry

## 2016-11-21 VITALS — BP 111/85 | HR 69 | Temp 98.8°F

## 2016-11-21 DIAGNOSIS — E0842 Diabetes mellitus due to underlying condition with diabetic polyneuropathy: Secondary | ICD-10-CM

## 2016-11-21 DIAGNOSIS — L97512 Non-pressure 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: Secondary | ICD-10-CM | POA: Diagnosis not present

## 2016-11-21 DIAGNOSIS — I70235 Atherosclerosis of native arteries of right leg with ulceration of other part of foot: Secondary | ICD-10-CM | POA: Diagnosis not present

## 2016-11-21 DIAGNOSIS — L84 Corns and callosities: Secondary | ICD-10-CM | POA: Diagnosis not present

## 2016-11-22 NOTE — Progress Notes (Signed)
   Subjective:  61 year old female with a history of diabetes mellitus presents today for follow-up treatment and evaluation regarding an ulcer to the right great toe. She states she is doing much better today although she reports some mild swelling of the toe. Pt reports she was hospitalized last week for sepsis. She presents today for follow-up treatment and evaluation.   Past Medical History:  Diagnosis Date  . Anxiety   . Arthritis    knees  . Coronary artery disease   . Depression   . Fatty liver   . History of nuclear stress test 12/16/2011   exercise myoview; normal images with 2-43mm ST-segment depression - subsequent cath revelaed subtotally occluded small 2nd marginal branch & 75% PDA lesion, normal LV function  . Hypertension   . Hypothyroidism   . Neuropathy    in feet  . OSA on CPAP    AHI = 44 (per patient) setting 13 per pt  . Personal history of kidney stones   . Primary localized osteoarthritis of left knee 04/24/2015  . Primary localized osteoarthritis of right knee 07/02/2015  . Tobacco abuse   . Type 2 diabetes mellitus (HCC)    insulin pump      Objective/Physical Exam General: The patient is alert and oriented x3 in no acute distress.  Dermatology:  Wound #1 noted to the  Plantar aspect of the right great toe measuring approximately 0.3  0.3  0.3 cm (LxWxD).   To the noted ulceration(s), there is no eschar. There is a minimal amount of slough, fibrin, and necrotic tissue noted. Granulation tissue and wound base is red. Negative for any significant purulent drainage There is no exposed bone muscle-tendon ligament or joint. There is no malodor. Periwound integrity is intact.  Skin is warm, dry and supple bilateral lower extremities.  Hyperkeratotic lesions x 2 present on the right foot. Pain on palpation with a central nucleated core noted.     Vascular: Palpable pedal pulses bilaterally. No edema or erythema noted. Capillary refill within normal  limits.  Neurological: Epicritic and protective threshold absent bilaterally.   Musculoskeletal Exam: Range of motion within normal limits to all pedal and ankle joints bilateral. Muscle strength 5/5 in all groups bilateral.   Assessment: #1 Ulcer right great toe secondary to diabetes mellitus #2 Pre-ulcerative calluses right foot x 2   Plan of Care:  #1 Patient was evaluated. #2 medically necessary excisional debridement including subcutaneous tissue was performed using a tissue nipper and a chisel blade. Excisional debridement of all the necrotic nonviable tissue down to healthy bleeding viable tissue was performed with post-debridement measurements same as pre-. #3 the wound was cleansed and dry sterile dressing applied. #4 Recommended antibiotic ointment and Band-Aid daily. #5 Continue wearing DM shoes. #6 Excisional debridement of keratoic lesions using a chisel blade was performed without incident.  #7 Return to clinic in 2 weeks.    Edrick Kins, DPM [facility]  Dr. Edrick Kins, [address]                                        [place], [place] [address]                Office [phone]  Fax [phone]

## 2016-12-05 ENCOUNTER — Ambulatory Visit (INDEPENDENT_AMBULATORY_CARE_PROVIDER_SITE_OTHER): Payer: BLUE CROSS/BLUE SHIELD | Admitting: Podiatry

## 2016-12-05 DIAGNOSIS — E0842 Diabetes mellitus due to underlying condition with diabetic polyneuropathy: Secondary | ICD-10-CM

## 2016-12-05 DIAGNOSIS — L97512 Non-pressure 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: Secondary | ICD-10-CM

## 2016-12-05 DIAGNOSIS — I70235 Atherosclerosis of native arteries of right leg with ulceration of other part of foot: Secondary | ICD-10-CM | POA: Diagnosis not present

## 2016-12-05 MED ORDER — GENTAMICIN SULFATE 0.1 % EX CREA: 1.0000 "application " | TOPICAL_CREAM | Freq: Three times a day (TID) | CUTANEOUS | 1 refills | Status: DC

## 2016-12-07 NOTE — Progress Notes (Signed)
   Subjective:  61 year old female with a history of diabetes mellitus presents today for follow-up treatment and evaluation regarding an ulcer to the right great toe. She states she is doing well and denies any pain at this time. She denies any new complaints. She presents today for follow-up treatment and evaluation.   Past Medical History:  Diagnosis Date  . Anxiety   . Arthritis    knees  . Coronary artery disease   . Depression   . Fatty liver   . History of nuclear stress test 12/16/2011   exercise myoview; normal images with 2-35mm ST-segment depression - subsequent cath revelaed subtotally occluded small 2nd marginal branch & 75% PDA lesion, normal LV function  . Hypertension   . Hypothyroidism   . Neuropathy    in feet  . OSA on CPAP    AHI = 44 (per patient) setting 13 per pt  . Personal history of kidney stones   . Primary localized osteoarthritis of left knee 04/24/2015  . Primary localized osteoarthritis of right knee 07/02/2015  . Tobacco abuse   . Type 2 diabetes mellitus (HCC)    insulin pump      Objective/Physical Exam General: The patient is alert and oriented x3 in no acute distress.  Dermatology:  Wound #1 noted to the  Plantar aspect of the right great toe measuring approximately 0.2  0.3  0.3 cm (LxWxD).   To the noted ulceration(s), there is no eschar. There is a minimal amount of slough, fibrin, and necrotic tissue noted. Granulation tissue and wound base is red. Negative for any significant purulent drainage There is no exposed bone muscle-tendon ligament or joint. There is no malodor. Periwound integrity is intact.  Skin is warm, dry and supple bilateral lower extremities.   Vascular: Palpable pedal pulses bilaterally. No edema or erythema noted. Capillary refill within normal limits.  Neurological: Epicritic and protective threshold absent bilaterally.   Musculoskeletal Exam: Range of motion within normal limits to all pedal and ankle joints  bilateral. Muscle strength 5/5 in all groups bilateral.   Assessment: #1 Ulcer right great toe secondary to diabetes mellitus   Plan of Care:  #1 Patient was evaluated. #2 medically necessary excisional debridement including subcutaneous tissue was performed using a tissue nipper and a chisel blade. Excisional debridement of all the necrotic nonviable tissue down to healthy bleeding viable tissue was performed with post-debridement measurements same as pre-. #3 the wound was cleansed and dry sterile dressing applied. #4 prescription for gentamicin cream given to patient. #5 recommended gentamicin cream and Band-Aid daily. #6 appointment with Liliane Channel or a diabetic shoes/insoles. #7 return to clinic in 2 weeks.    Edrick Kins, DPM [facility]  Dr. Edrick Kins, [address]                                        [place], [place] [address]                Office [phone]  Fax [phone]

## 2016-12-13 ENCOUNTER — Ambulatory Visit: Payer: BLUE CROSS/BLUE SHIELD | Admitting: Orthotics

## 2016-12-13 DIAGNOSIS — I70235 Atherosclerosis of native arteries of right leg with ulceration of other part of foot: Secondary | ICD-10-CM

## 2016-12-13 DIAGNOSIS — L97512 Non-pressure 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: Secondary | ICD-10-CM

## 2016-12-13 DIAGNOSIS — L97522 Non-pressure chronic ulcer of other part of left foot with fat layer exposed: Secondary | ICD-10-CM

## 2016-12-13 NOTE — Progress Notes (Signed)
Patient presents today for diabetic shoe measurement and foam casting.  Goals of diabetic shoes/inserts to offer protection from conditions secondary to DM2, offer relief from sheer forces that could lead to ulcerations, protect the foot, and offer greater stability. Patient is under supervision of DPM [place] Physician managing patients DM2: Reynaldo Minium Patient has following documented conditions to qualify for diabetic shoes/inserts: Patient measured with brannock device: Patient chose [id number]

## 2016-12-19 ENCOUNTER — Ambulatory Visit (INDEPENDENT_AMBULATORY_CARE_PROVIDER_SITE_OTHER): Payer: BLUE CROSS/BLUE SHIELD | Admitting: Podiatry

## 2016-12-19 ENCOUNTER — Encounter: Payer: Self-pay | Admitting: Podiatry

## 2016-12-19 DIAGNOSIS — E0842 Diabetes mellitus due to underlying condition with diabetic polyneuropathy: Secondary | ICD-10-CM

## 2016-12-19 DIAGNOSIS — I70235 Atherosclerosis of native arteries of right leg with ulceration of other part of foot: Secondary | ICD-10-CM

## 2016-12-19 DIAGNOSIS — L97512 Non-pressure 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: Secondary | ICD-10-CM

## 2016-12-21 NOTE — Progress Notes (Signed)
   Subjective:  61 year old female with a history of diabetes mellitus presents today for follow-up treatment and evaluation regarding an ulcer to the right great toe. She states she is doing well and has been applying the gentamycin cream as directed. She presents today for follow-up treatment and evaluation.   Past Medical History:  Diagnosis Date  . Anxiety   . Arthritis    knees  . Coronary artery disease   . Depression   . Fatty liver   . History of nuclear stress test 12/16/2011   exercise myoview; normal images with 2-61mm ST-segment depression - subsequent cath revelaed subtotally occluded small 2nd marginal branch & 75% PDA lesion, normal LV function  . Hypertension   . Hypothyroidism   . Neuropathy    in feet  . OSA on CPAP    AHI = 44 (per patient) setting 13 per pt  . Personal history of kidney stones   . Primary localized osteoarthritis of left knee 04/24/2015  . Primary localized osteoarthritis of right knee 07/02/2015  . Tobacco abuse   . Type 2 diabetes mellitus (HCC)    insulin pump      Objective/Physical Exam General: The patient is alert and oriented x3 in no acute distress.  Dermatology:  Wound #1 noted to the medial right great toe measuring 1.3 x 0.2 x 0.1 cm (LxWxD).   To the noted ulceration(s), there is no eschar. There is a minimal amount of slough, fibrin, and necrotic tissue noted. Granulation tissue and wound base is red. Negative for any significant purulent drainage There is no exposed bone muscle-tendon ligament or joint. There is no malodor. Periwound integrity is intact.  Skin is warm, dry and supple bilateral lower extremities.   Vascular: Palpable pedal pulses bilaterally. No edema or erythema noted. Capillary refill within normal limits.  Neurological: Epicritic and protective threshold absent bilaterally.   Musculoskeletal Exam: Range of motion within normal limits to all pedal and ankle joints bilateral. Muscle strength 5/5 in all groups  bilateral.   Assessment: #1 Ulcer medial right great toe secondary to diabetes mellitus   Plan of Care:  #1 Patient was evaluated. #2 medically necessary excisional debridement including subcutaneous tissue was performed using a tissue nipper and a chisel blade. Excisional debridement of all the necrotic nonviable tissue down to healthy bleeding viable tissue was performed with post-debridement measurements same as pre-. #3 the wound was cleansed and dry sterile dressing applied. #4 Recommended daily use of Gentamycin cream with a Band-Aid. #5 Return to clinic in three weeks.    Edrick Kins, DPM [facility]  Dr. Edrick Kins, [place]                                        [place], [place] [address]                Office [phone]  Fax [phone]

## 2016-12-22 DIAGNOSIS — Z794 Long term (current) use of insulin: Secondary | ICD-10-CM | POA: Insufficient documentation

## 2017-01-06 ENCOUNTER — Ambulatory Visit: Payer: 59 | Admitting: *Deleted

## 2017-01-09 ENCOUNTER — Ambulatory Visit: Payer: BLUE CROSS/BLUE SHIELD | Admitting: Podiatry

## 2017-01-09 ENCOUNTER — Encounter: Payer: Self-pay | Admitting: Podiatry

## 2017-01-09 DIAGNOSIS — I70235 Atherosclerosis of native arteries of right leg with ulceration of other part of foot: Secondary | ICD-10-CM

## 2017-01-09 DIAGNOSIS — L97512 Non-pressure 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: Secondary | ICD-10-CM | POA: Diagnosis not present

## 2017-01-09 DIAGNOSIS — E0842 Diabetes mellitus due to underlying condition with diabetic polyneuropathy: Secondary | ICD-10-CM | POA: Diagnosis not present

## 2017-01-14 NOTE — Progress Notes (Signed)
   Subjective:  61 year old female with a history of diabetes mellitus presents today for follow-up evaluation regarding an ulcer to the right great toe. She states she is doing well. She has no new complaints at this time. She presents today for follow-up treatment and evaluation.   Past Medical History:  Diagnosis Date  . Anxiety   . Arthritis    knees  . Coronary artery disease   . Depression   . Fatty liver   . History of nuclear stress test 12/16/2011   exercise myoview; normal images with 2-51mm ST-segment depression - subsequent cath revelaed subtotally occluded small 2nd marginal branch & 75% PDA lesion, normal LV function  . Hypertension   . Hypothyroidism   . Neuropathy    in feet  . OSA on CPAP    AHI = 44 (per patient) setting 13 per pt  . Personal history of kidney stones   . Primary localized osteoarthritis of left knee 04/24/2015  . Primary localized osteoarthritis of right knee 07/02/2015  . Tobacco abuse   . Type 2 diabetes mellitus (HCC)    insulin pump      Objective/Physical Exam General: The patient is alert and oriented x3 in no acute distress.  Dermatology: Wound noted to the right great toe has healed. Complete re-epithelialization has occurred. No drainage noted.   Vascular: Palpable pedal pulses bilaterally. No edema or erythema noted. Capillary refill within normal limits.  Neurological: Epicritic and protective threshold absent bilaterally.   Musculoskeletal Exam: Range of motion within normal limits to all pedal and ankle joints bilateral. Muscle strength 5/5 in all groups bilateral.   Assessment: #1 Ulcer medial right great toe secondary to diabetes mellitus - healed   Plan of Care:  #1 Patient was evaluated. #2 Continue wearing DM shoes. #3 recommended good foot cream #4 Return to clinic when necessary.    Edrick Kins, DPM [facility]  Dr. Edrick Kins, [address]                                         [place], [place] [address]                Office [phone]  Fax [phone]

## 2017-01-16 ENCOUNTER — Telehealth: Payer: Self-pay | Admitting: Podiatry

## 2017-01-16 NOTE — Telephone Encounter (Signed)
Left voicemail for pt to call to discuss diabetic shoe coverage.

## 2017-01-17 ENCOUNTER — Encounter: Payer: Self-pay | Admitting: Registered"

## 2017-01-17 ENCOUNTER — Encounter: Payer: BLUE CROSS/BLUE SHIELD | Attending: Internal Medicine | Admitting: Registered"

## 2017-01-17 DIAGNOSIS — E1110 Type 2 diabetes mellitus with ketoacidosis without coma: Secondary | ICD-10-CM | POA: Diagnosis not present

## 2017-01-17 DIAGNOSIS — Z794 Long term (current) use of insulin: Secondary | ICD-10-CM | POA: Insufficient documentation

## 2017-01-17 DIAGNOSIS — Z713 Dietary counseling and surveillance: Secondary | ICD-10-CM | POA: Insufficient documentation

## 2017-01-17 NOTE — Patient Instructions (Signed)
Plan:   Aim for 3 Carb Choices per meal (45 grams) +/- 1 either way   Aim for 0-1 Carbs (0-15 grams) per snack if hungry   Include protein with your meals and snacks  Consider reading food labels for Total Carbohydrate and Saturated Fat Grams of foods  Consider increasing your activity level to 3-5 times per week for 30 minutes as tolerated  Continue checking blood sugar at alternate times per day as directed by MD   Continue taking medication as directed by MD  Consider having fish 2-3 x week  Aim to get 7-8 hours sleep

## 2017-01-17 NOTE — Progress Notes (Signed)
Diabetes Self-Management Education  Visit Type: First/Initial  Appt. Start Time: 0805 Appt. End Time: 0920  01/17/2017  [name], identified by name and date of birth, is a 61 y.o. female with a diagnosis of Diabetes: Type 2.   ASSESSMENT Patient states she does the shopping and cooking for herself and husband and often cooks red meat and potatoes because that is what he likes. Pt states she loves all vegetables and is not a picky eater. Pt states she eats chips more for the crunch rather than hunger. RD did not have time to review mindful eating at this visit.  Patient states she stays busy during the day and doesn't eat at regular times. Pt states she is tired a lot and has been for awhile. Pt reports the welbutrin helps her energy level. Patient uses CPAP but will wake up at 3 am and stay awake for a couple of hours.   Patient states she is in the process of getting approved for the [facility] monitor.  Diabetes Self-Management Education - 01/17/17 0815      Visit Information   Visit Type  First/Initial      Initial Visit   Diabetes Type  Type 2    Are you currently following a meal plan?  No    Are you taking your medications as prescribed?  Yes    Date Diagnosed  2004      Health Coping   How would you rate your overall health?  Fair      Psychosocial Assessment   Patient Belief/Attitude about Diabetes  Motivated to manage diabetes    How often do you need to have someone help you when you read instructions, pamphlets, or other written materials from your doctor or pharmacy?  1 - Never    What is the last grade level you completed in school?  Associates Degree      Complications   Last HgB A1C per patient/outside source  7.6 % per patient    How often do you check your blood sugar?  > 4 times/day    Fasting Blood glucose range (mg/dL)  -- 137    Postprandial Blood glucose range (mg/dL)  -- 93 - 200    Have you had a dilated eye exam in the past 12  months?  Yes    Have you had a dental exam in the past 12 months?  Yes    Are you checking your feet?  Yes    How many days per week are you checking your feet?  7      Dietary Intake   Breakfast  none only black coffee    Snack (morning)  toast OR fruit    Lunch  salad, (Methos) feta cheese OR pizza OR fried fish OR taco salad     Dinner  pimento cheese, salad OR PB Sandwich OR pimento cheese sandwich after 8 pm    Snack (evening)  ice cream OR chips    Beverage(s)  coffee, whole milk, unsweet tea, [employer] peach tea drop-in       Exercise   Exercise Type  ADL's    How many days per week to you exercise?  0    How many minutes per day do you exercise?  0    Total minutes per week of exercise  0      Patient Education   Previous Diabetes Education  Yes (please comment) in Dr. Gabriel Carina    Disease state  Definition of diabetes, type 1 and 2, and the diagnosis of diabetes    Nutrition management   Role of diet in the treatment of diabetes and the relationship between the three main macronutrients and blood glucose level;Carbohydrate counting;Food label reading, portion sizes and measuring food.    Physical activity and exercise   Role of exercise on diabetes management, blood pressure control and cardiac health.    Monitoring  Identified appropriate SMBG and/or A1C goals.    Acute complications  Taught treatment of hypoglycemia - the 15 rule.    Psychosocial adjustment  Role of stress on diabetes      Individualized Goals (developed by patient)   Nutrition  General guidelines for healthy choices and portions discussed    Physical Activity  Exercise 3-5 times per week    Reducing Risk  increase portions of healthy fats omega 3 in fish      Outcomes   Expected Outcomes  Demonstrated interest in learning. Expect positive outcomes    Future DMSE  4-6 wks    Program Status  Completed     Individualized Plan for Diabetes Self-Management Training:   Learning Objective:  Patient will  have a greater understanding of diabetes self-management. Patient education plan is to attend individual and/or group sessions per assessed needs and concerns.   Patient Instructions  Plan:   Aim for 3 Carb Choices per meal (45 grams) +/- 1 either way   Aim for 0-1 Carbs (0-15 grams) per snack if hungry   Include protein with your meals and snacks  Consider reading food labels for Total Carbohydrate and Saturated Fat Grams of foods  Consider increasing your activity level to 3-5 times per week for 30 minutes as tolerated  Continue checking blood sugar at alternate times per day as directed by MD   Continue taking medication as directed by MD  Consider having fish 2-3 x week  Aim to get 7-8 hours sleep   Expected Outcomes:  Demonstrated interest in learning. Expect positive outcomes  Education material provided: Living Well with Diabetes, A1C conversion sheet, My Plate and Carbohydrate counting sheet, Sleep Hygiene, Counselor list  If problems or questions, patient to contact team via:  Phone  Future DSME appointment: 4-6 wks

## 2017-01-23 ENCOUNTER — Ambulatory Visit (INDEPENDENT_AMBULATORY_CARE_PROVIDER_SITE_OTHER): Payer: BLUE CROSS/BLUE SHIELD | Admitting: Orthotics

## 2017-01-23 DIAGNOSIS — L97512 Non-pressure 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: Secondary | ICD-10-CM

## 2017-01-23 DIAGNOSIS — M722 Plantar fascial fibromatosis: Secondary | ICD-10-CM

## 2017-01-23 NOTE — Telephone Encounter (Signed)
Pt called to see if diabetic shoes are in and I explained per her insurance company they are not covered and they cost 250.00 per [employer]. Pt is schedule to pick them up today at 330 and is aware of cost.

## 2017-01-23 NOTE — Progress Notes (Signed)
Patient came in today to pick up diabetic shoes and custom inserts.  Same was well pleased with fit and function.   The patient could ambulate without any discomfort; there were no signs of any quality issues. The foot ortheses offered full contact with plantar surface and contoured the arch well.   The shoes fit well with no heel slippage and areas of pressure concern.   Patient advised to contact us if any problems arise.  Patient also advised on how to report any issues.  Due to insurnace not covered, patient paid $250

## 2017-02-22 ENCOUNTER — Ambulatory Visit: Payer: 59 | Admitting: Registered"

## 2017-02-23 ENCOUNTER — Ambulatory Visit: Payer: 59 | Admitting: Registered"

## 2017-03-20 ENCOUNTER — Encounter: Payer: Self-pay | Admitting: Podiatry

## 2017-03-20 ENCOUNTER — Ambulatory Visit: Payer: BLUE CROSS/BLUE SHIELD | Admitting: Podiatry

## 2017-03-20 DIAGNOSIS — B351 Tinea unguium: Secondary | ICD-10-CM | POA: Diagnosis not present

## 2017-03-20 DIAGNOSIS — E0842 Diabetes mellitus due to underlying condition with diabetic polyneuropathy: Secondary | ICD-10-CM

## 2017-03-20 DIAGNOSIS — M79676 Pain in unspecified toe(s): Secondary | ICD-10-CM | POA: Diagnosis not present

## 2017-03-20 DIAGNOSIS — I70235 Atherosclerosis of native arteries of right leg with ulceration of other part of foot: Secondary | ICD-10-CM

## 2017-03-20 DIAGNOSIS — L989 Disorder of the skin and subcutaneous tissue, unspecified: Secondary | ICD-10-CM

## 2017-03-23 NOTE — Progress Notes (Signed)
    Subjective: Patient is a 62 y.o. female presenting to the office today for a chief complaint of painful callus lesions to the bilateral feet that have been present for several months.  Patient also complains of elongated, thickened nails that cause pain while ambulating in shoes. Patient is unable to trim their own nails. Patient presents today for further treatment and evaluation.   Past Medical History:  Diagnosis Date  . Anxiety   . Arthritis    knees  . Coronary artery disease   . Depression   . Fatty liver   . History of nuclear stress test 12/16/2011   exercise myoview; normal images with 2-37mm ST-segment depression - subsequent cath revelaed subtotally occluded small 2nd marginal branch & 75% PDA lesion, normal LV function  . Hypertension   . Hypothyroidism   . Neuropathy    in feet  . OSA on CPAP    AHI = 44 (per patient) setting 13 per pt  . Personal history of kidney stones   . Primary localized osteoarthritis of left knee 04/24/2015  . Primary localized osteoarthritis of right knee 07/02/2015  . Tobacco abuse   . Type 2 diabetes mellitus (HCC)    insulin pump    Objective:  Physical Exam General: Alert and oriented x3 in no acute distress  Dermatology: Hyperkeratotic lesions present on the bilateral feet x 4. Pain on palpation with a central nucleated core noted. Skin is warm, dry and supple bilateral lower extremities. Negative for open lesions or macerations. Nails are tender, long, thickened and dystrophic with subungual debris, consistent with onychomycosis, 1-5 bilateral. No signs of infection noted.  Vascular: Palpable pedal pulses bilaterally. No edema or erythema noted. Capillary refill within normal limits.  Neurological: Epicritic and protective threshold grossly intact bilaterally.   Musculoskeletal Exam: Pain on palpation at the keratotic lesion noted. Range of motion within normal limits bilateral. Muscle strength 5/5 in all groups  bilateral.  Assessment: 1. Onychodystrophic nails 1-5 bilateral with hyperkeratosis of nails.  2. Onychomycosis of nail due to dermatophyte bilateral 3. Pre-ulcerative callus lesions to the bilateral feet x 4   Plan of Care:  #1 Patient evaluated. #2 Excisional debridement of keratoic lesion using a chisel blade was performed without incident.  #3 Dressed with light dressing. #4 Mechanical debridement of nails 1-5 bilaterally performed using a nail nipper. Filed with dremel without incident.  #5 Patient is to return to the clinic in 3 months.   Edrick Kins, DPM [facility]  Dr. Edrick Kins, [address]                                        [place], [place] [address]                Office [phone]  Fax [phone]

## 2017-06-19 ENCOUNTER — Ambulatory Visit (INDEPENDENT_AMBULATORY_CARE_PROVIDER_SITE_OTHER): Payer: Medicare HMO | Admitting: Podiatry

## 2017-06-19 ENCOUNTER — Encounter: Payer: Self-pay | Admitting: Podiatry

## 2017-06-19 DIAGNOSIS — E0842 Diabetes mellitus due to underlying condition with diabetic polyneuropathy: Secondary | ICD-10-CM

## 2017-06-19 DIAGNOSIS — L989 Disorder of the skin and subcutaneous tissue, unspecified: Secondary | ICD-10-CM

## 2017-06-19 DIAGNOSIS — M79676 Pain in unspecified toe(s): Secondary | ICD-10-CM

## 2017-06-19 DIAGNOSIS — B351 Tinea unguium: Secondary | ICD-10-CM

## 2017-06-20 NOTE — Progress Notes (Signed)
    Subjective: Patient is a 61 y.o. female presenting to the office today for a chief complaint of painful callus lesions to the bilateral feet that have been present for several months. Bearing weight increases the pain. She has not had any treatment at home.  Patient also complains of elongated, thickened nails that cause pain while ambulating in shoes. She is unable to trim her own nails. Patient presents today for further treatment and evaluation.   Past Medical History:  Diagnosis Date  . Anxiety   . Arthritis    knees  . Coronary artery disease   . Depression   . Fatty liver   . History of nuclear stress test 12/16/2011   exercise myoview; normal images with 2-3mm ST-segment depression - subsequent cath revelaed subtotally occluded small 2nd marginal branch & 75% PDA lesion, normal LV function  . Hypertension   . Hypothyroidism   . Neuropathy    in feet  . OSA on CPAP    AHI = 44 (per patient) setting 13 per pt  . Personal history of kidney stones   . Primary localized osteoarthritis of left knee 04/24/2015  . Primary localized osteoarthritis of right knee 07/02/2015  . Tobacco abuse   . Type 2 diabetes mellitus (HCC)    insulin pump    Objective:  Physical Exam General: Alert and oriented x3 in no acute distress  Dermatology: Hyperkeratotic lesions present on the right foot x 1. Pain on palpation with a central nucleated core noted. Skin is warm, dry and supple bilateral lower extremities. Negative for open lesions or macerations. Nails are tender, long, thickened and dystrophic with subungual debris, consistent with onychomycosis, 1-5 bilateral. No signs of infection noted.  Vascular: Palpable pedal pulses bilaterally. No edema or erythema noted. Capillary refill within normal limits.  Neurological: Epicritic and protective threshold grossly intact bilaterally.   Musculoskeletal Exam: Pain on palpation at the keratotic lesion noted. Range of motion within normal limits  bilateral. Muscle strength 5/5 in all groups bilateral.  Assessment: 1. Onychodystrophic nails 1-5 left, 1, 4 and 5 right with hyperkeratosis of nails.  2. Onychomycosis of nail due to dermatophyte bilateral 3. Pre-ulcerative callus lesions to the right foot x 1 4. H/o toe amputations 2-3 right    Plan of Care:  #1 Patient evaluated. #2 Excisional debridement of keratoic lesion using a chisel blade was performed without incident.  #3 Dressed with light dressing. #4 Mechanical debridement of nails 1-5 left, 1, 4 and 5 right performed using a nail nipper. Filed with dremel without incident.  #5 Patient is to return to the clinic in 3 months.   Tyriq Moragne M. Evoleth Nordmeyer, DPM [facility]  Dr. Chen Saadeh M. Adlyn Fife, DPM    [address]                                        [place], [place] [address]                Office [phone]  Fax [phone]   

## 2017-07-05 DIAGNOSIS — F329 Major depressive disorder, single episode, unspecified: Secondary | ICD-10-CM | POA: Diagnosis not present

## 2017-07-05 DIAGNOSIS — F430 Acute stress reaction: Secondary | ICD-10-CM | POA: Diagnosis not present

## 2017-07-18 DIAGNOSIS — F329 Major depressive disorder, single episode, unspecified: Secondary | ICD-10-CM | POA: Diagnosis not present

## 2017-07-18 DIAGNOSIS — F430 Acute stress reaction: Secondary | ICD-10-CM | POA: Diagnosis not present

## 2017-07-21 DIAGNOSIS — F430 Acute stress reaction: Secondary | ICD-10-CM | POA: Diagnosis not present

## 2017-07-21 DIAGNOSIS — Z6841 Body Mass Index (BMI) 40.0 and over, adult: Secondary | ICD-10-CM | POA: Diagnosis not present

## 2017-07-21 DIAGNOSIS — Z794 Long term (current) use of insulin: Secondary | ICD-10-CM | POA: Diagnosis not present

## 2017-07-21 DIAGNOSIS — Z720 Tobacco use: Secondary | ICD-10-CM | POA: Diagnosis not present

## 2017-07-21 DIAGNOSIS — E1140 Type 2 diabetes mellitus with diabetic neuropathy, unspecified: Secondary | ICD-10-CM | POA: Diagnosis not present

## 2017-08-04 DIAGNOSIS — R69 Illness, unspecified: Secondary | ICD-10-CM | POA: Diagnosis not present

## 2017-08-08 DIAGNOSIS — F430 Acute stress reaction: Secondary | ICD-10-CM | POA: Diagnosis not present

## 2017-08-08 DIAGNOSIS — R69 Illness, unspecified: Secondary | ICD-10-CM | POA: Diagnosis not present

## 2017-08-10 DIAGNOSIS — S61411A Laceration without foreign body of right hand, initial encounter: Secondary | ICD-10-CM | POA: Diagnosis not present

## 2017-08-10 DIAGNOSIS — L089 Local infection of the skin and subcutaneous tissue, unspecified: Secondary | ICD-10-CM | POA: Diagnosis not present

## 2017-08-15 DIAGNOSIS — I10 Essential (primary) hypertension: Secondary | ICD-10-CM | POA: Diagnosis not present

## 2017-08-15 DIAGNOSIS — Z794 Long term (current) use of insulin: Secondary | ICD-10-CM | POA: Diagnosis not present

## 2017-08-15 DIAGNOSIS — Z4681 Encounter for fitting and adjustment of insulin pump: Secondary | ICD-10-CM | POA: Diagnosis not present

## 2017-08-15 DIAGNOSIS — E1140 Type 2 diabetes mellitus with diabetic neuropathy, unspecified: Secondary | ICD-10-CM | POA: Diagnosis not present

## 2017-08-21 DIAGNOSIS — R69 Illness, unspecified: Secondary | ICD-10-CM | POA: Diagnosis not present

## 2017-08-21 DIAGNOSIS — F430 Acute stress reaction: Secondary | ICD-10-CM | POA: Diagnosis not present

## 2017-08-30 DIAGNOSIS — I10 Essential (primary) hypertension: Secondary | ICD-10-CM | POA: Diagnosis not present

## 2017-08-30 DIAGNOSIS — E1140 Type 2 diabetes mellitus with diabetic neuropathy, unspecified: Secondary | ICD-10-CM | POA: Diagnosis not present

## 2017-08-30 DIAGNOSIS — R82998 Other abnormal findings in urine: Secondary | ICD-10-CM | POA: Diagnosis not present

## 2017-08-30 DIAGNOSIS — E038 Other specified hypothyroidism: Secondary | ICD-10-CM | POA: Diagnosis not present

## 2017-09-04 DIAGNOSIS — E1165 Type 2 diabetes mellitus with hyperglycemia: Secondary | ICD-10-CM | POA: Diagnosis not present

## 2017-09-06 DIAGNOSIS — Z89429 Acquired absence of other toe(s), unspecified side: Secondary | ICD-10-CM | POA: Diagnosis not present

## 2017-09-06 DIAGNOSIS — G608 Other hereditary and idiopathic neuropathies: Secondary | ICD-10-CM | POA: Diagnosis not present

## 2017-09-06 DIAGNOSIS — Z0000 Encounter for general adult medical examination without abnormal findings: Secondary | ICD-10-CM | POA: Diagnosis not present

## 2017-09-06 DIAGNOSIS — G4733 Obstructive sleep apnea (adult) (pediatric): Secondary | ICD-10-CM | POA: Diagnosis not present

## 2017-09-06 DIAGNOSIS — Z23 Encounter for immunization: Secondary | ICD-10-CM | POA: Diagnosis not present

## 2017-09-06 DIAGNOSIS — L919 Hypertrophic disorder of the skin, unspecified: Secondary | ICD-10-CM | POA: Insufficient documentation

## 2017-09-06 DIAGNOSIS — E1140 Type 2 diabetes mellitus with diabetic neuropathy, unspecified: Secondary | ICD-10-CM | POA: Diagnosis not present

## 2017-09-06 DIAGNOSIS — E7849 Other hyperlipidemia: Secondary | ICD-10-CM | POA: Diagnosis not present

## 2017-09-06 DIAGNOSIS — R69 Illness, unspecified: Secondary | ICD-10-CM | POA: Diagnosis not present

## 2017-09-06 DIAGNOSIS — Z794 Long term (current) use of insulin: Secondary | ICD-10-CM | POA: Diagnosis not present

## 2017-09-06 DIAGNOSIS — L84 Corns and callosities: Secondary | ICD-10-CM | POA: Diagnosis not present

## 2017-09-12 DIAGNOSIS — F430 Acute stress reaction: Secondary | ICD-10-CM | POA: Diagnosis not present

## 2017-09-12 DIAGNOSIS — R69 Illness, unspecified: Secondary | ICD-10-CM | POA: Diagnosis not present

## 2017-09-14 DIAGNOSIS — E1129 Type 2 diabetes mellitus with other diabetic kidney complication: Secondary | ICD-10-CM | POA: Diagnosis not present

## 2017-09-18 ENCOUNTER — Encounter: Payer: Self-pay | Admitting: Podiatry

## 2017-09-18 ENCOUNTER — Ambulatory Visit: Payer: Medicare HMO | Admitting: Podiatry

## 2017-09-18 DIAGNOSIS — B351 Tinea unguium: Secondary | ICD-10-CM | POA: Diagnosis not present

## 2017-09-18 DIAGNOSIS — I70235 Atherosclerosis of native arteries of right leg with ulceration of other part of foot: Secondary | ICD-10-CM

## 2017-09-18 DIAGNOSIS — L989 Disorder of the skin and subcutaneous tissue, unspecified: Secondary | ICD-10-CM | POA: Diagnosis not present

## 2017-09-18 DIAGNOSIS — E0842 Diabetes mellitus due to underlying condition with diabetic polyneuropathy: Secondary | ICD-10-CM | POA: Diagnosis not present

## 2017-09-18 DIAGNOSIS — M79676 Pain in unspecified toe(s): Secondary | ICD-10-CM | POA: Diagnosis not present

## 2017-09-23 NOTE — Progress Notes (Signed)
    Subjective: Patient is a 62 y.o. female presenting to the office today for a chief complaint of painful callus lesions to the bilateral feet that have been present for several months. Bearing weight increases the pain. She has not had any treatment at home.  Patient also complains of elongated, thickened nails that cause pain while ambulating in shoes. She is unable to trim her own nails. Patient presents today for further treatment and evaluation.   Past Medical History:  Diagnosis Date  . Anxiety   . Arthritis    knees  . Coronary artery disease   . Depression   . Fatty liver   . History of nuclear stress test 12/16/2011   exercise myoview; normal images with 2-63mm ST-segment depression - subsequent cath revelaed subtotally occluded small 2nd marginal branch & 75% PDA lesion, normal LV function  . Hypertension   . Hypothyroidism   . Neuropathy    in feet  . OSA on CPAP    AHI = 44 (per patient) setting 13 per pt  . Personal history of kidney stones   . Primary localized osteoarthritis of left knee 04/24/2015  . Primary localized osteoarthritis of right knee 07/02/2015  . Tobacco abuse   . Type 2 diabetes mellitus (HCC)    insulin pump    Objective:  Physical Exam General: Alert and oriented x3 in no acute distress  Dermatology: Hyperkeratotic lesions present on the right foot x 1. Pain on palpation with a central nucleated core noted. Skin is warm, dry and supple bilateral lower extremities. Negative for open lesions or macerations. Nails are tender, long, thickened and dystrophic with subungual debris, consistent with onychomycosis, 1-5 bilateral. No signs of infection noted.  Vascular: Palpable pedal pulses bilaterally. No edema or erythema noted. Capillary refill within normal limits.  Neurological: Epicritic and protective threshold grossly intact bilaterally.   Musculoskeletal Exam: Pain on palpation at the keratotic lesion noted. Range of motion within normal limits  bilateral. Muscle strength 5/5 in all groups bilateral.  Assessment: 1. Onychodystrophic nails 1-5 left, 1, 4 and 5 right with hyperkeratosis of nails.  2. Onychomycosis of nail due to dermatophyte bilateral 3. Pre-ulcerative callus lesions to the right foot x 1 4. H/o toe amputations 2-3 right    Plan of Care:  #1 Patient evaluated. #2 Excisional debridement of keratoic lesion using a chisel blade was performed without incident.  #3 Dressed with light dressing. #4 Mechanical debridement of nails 1-5 left, 1, 4 and 5 right performed using a nail nipper. Filed with dremel without incident.  #5 Patient is to return to the clinic in 3 months.   Edrick Kins, DPM [facility]  Dr. Edrick Kins, [address]                                        [place], [place] [address]                Office [phone]  Fax [phone]

## 2017-09-25 DIAGNOSIS — R748 Abnormal levels of other serum enzymes: Secondary | ICD-10-CM | POA: Diagnosis not present

## 2017-09-25 DIAGNOSIS — F430 Acute stress reaction: Secondary | ICD-10-CM | POA: Diagnosis not present

## 2017-09-25 DIAGNOSIS — Z6841 Body Mass Index (BMI) 40.0 and over, adult: Secondary | ICD-10-CM | POA: Diagnosis not present

## 2017-09-25 DIAGNOSIS — E1129 Type 2 diabetes mellitus with other diabetic kidney complication: Secondary | ICD-10-CM | POA: Diagnosis not present

## 2017-09-25 DIAGNOSIS — R112 Nausea with vomiting, unspecified: Secondary | ICD-10-CM | POA: Diagnosis not present

## 2017-09-25 DIAGNOSIS — R69 Illness, unspecified: Secondary | ICD-10-CM | POA: Diagnosis not present

## 2017-10-17 DIAGNOSIS — R69 Illness, unspecified: Secondary | ICD-10-CM | POA: Diagnosis not present

## 2017-10-17 DIAGNOSIS — F430 Acute stress reaction: Secondary | ICD-10-CM | POA: Diagnosis not present

## 2017-10-26 IMAGING — CT CT ABD-PELV W/O CM
2 of 4 series · 16 of 46 positions shown, 18 images · non-contrast
Comparison: CT 10/30/2014

CLINICAL DATA: Nausea and vomiting. Pt had nausea and vomiting that
started this morning. Pt does have insulin pump that was left at
home. CBG was 452 upon [REDACTED] arrival. Pt vomited 3x while in truck
and had one episode of diarrhea upon arrival.

EXAM:
CT ABDOMEN AND PELVIS WITHOUT CONTRAST
TECHNIQUE: Multidetector CT imaging of the abdomen and pelvis was performed
following the standard protocol without IV contrast.

[Series 2: abd/pel w/o · axial · non-contrast · 0.85mm/px · z∈[-492,-62]mm · 13 of 98 slices shown, 15 images]
[im 6/98  soft-tissue]
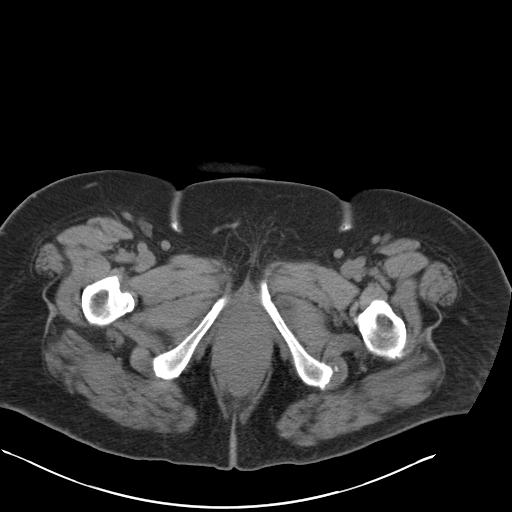
[im 6/98  bone]
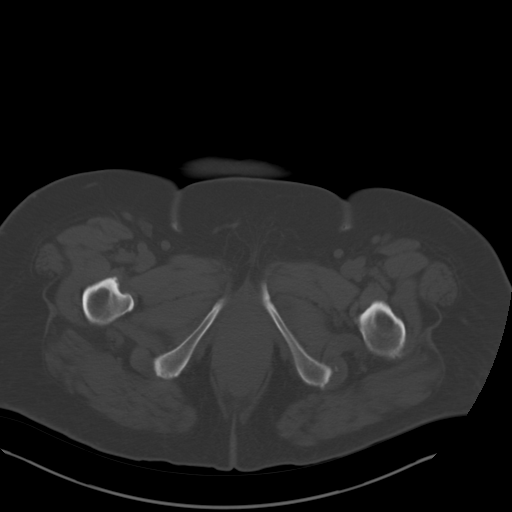
[im 12/98  soft-tissue]
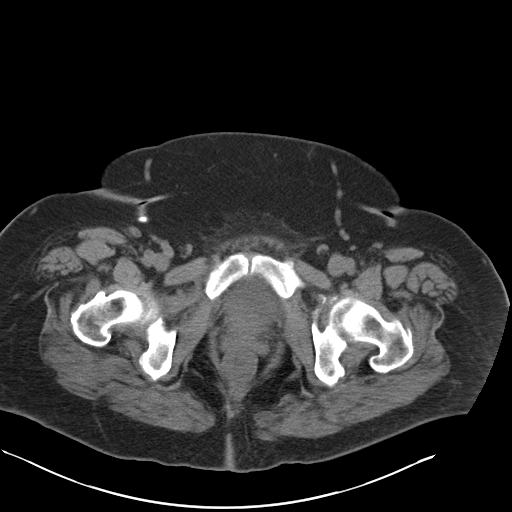
[im 23/98  soft-tissue]
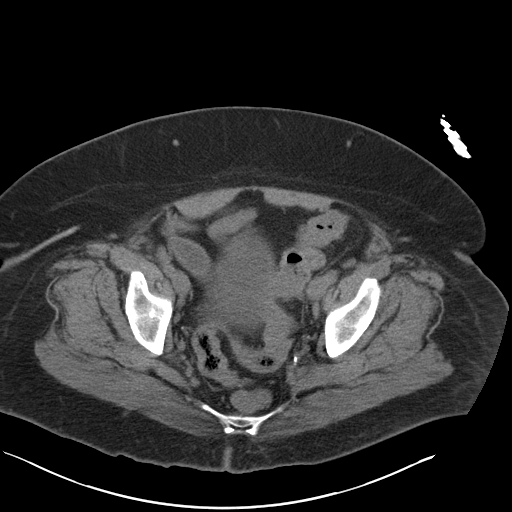
[im 29/98  soft-tissue]
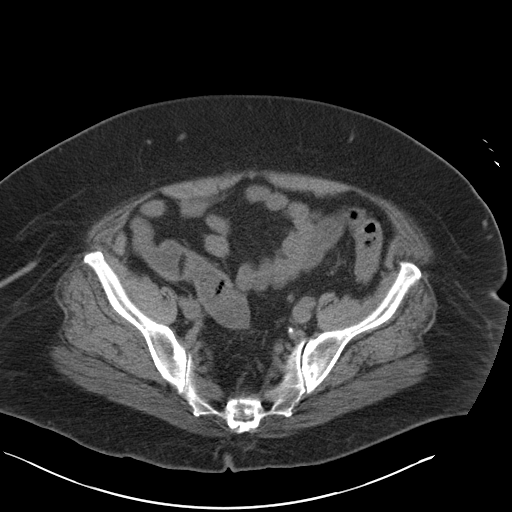
[im 35/98  soft-tissue]
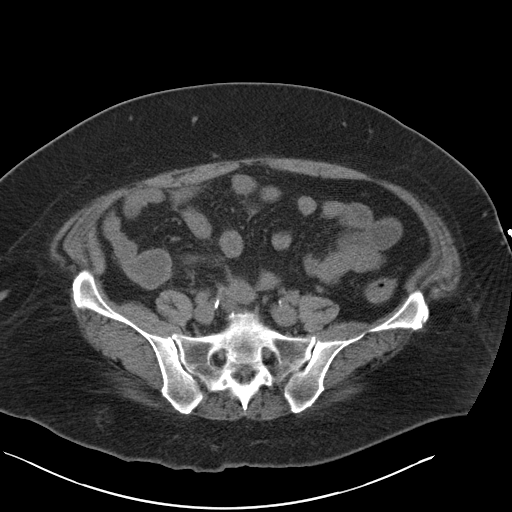
[im 40/98  soft-tissue]
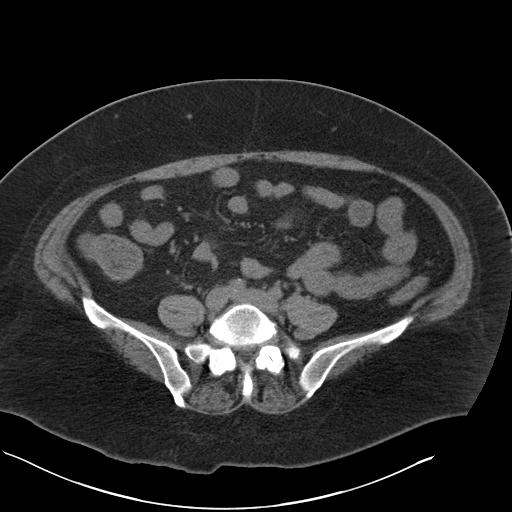
[im 52/98  soft-tissue]
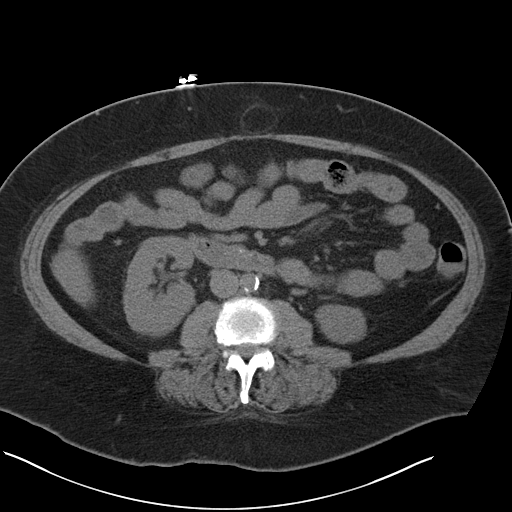
[im 58/98  soft-tissue]
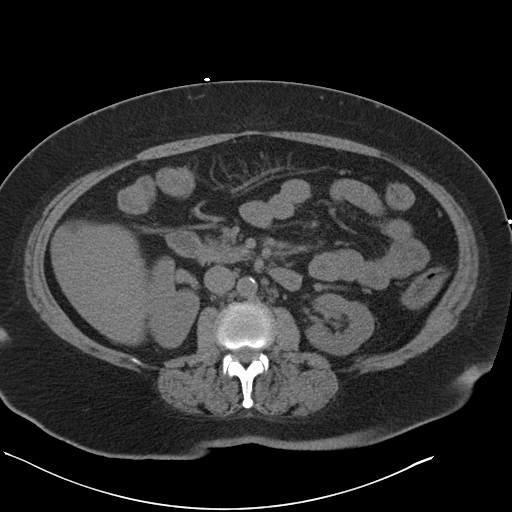
[im 63/98  soft-tissue]
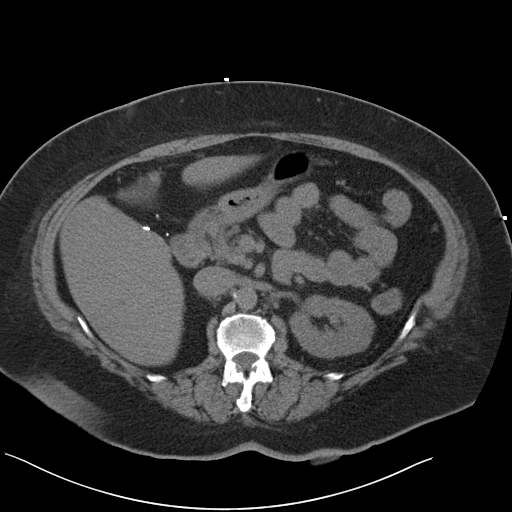
[im 63/98  bone]
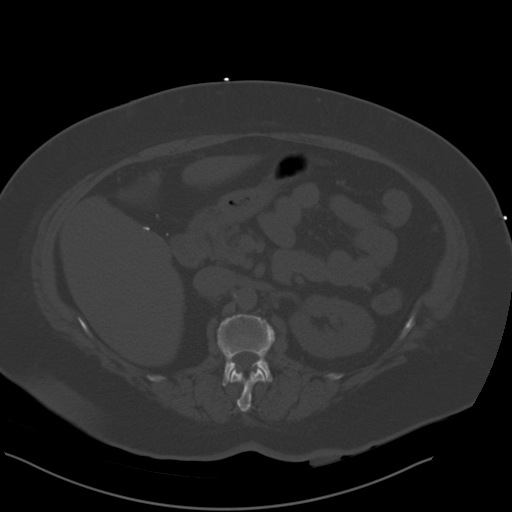
[im 69/98  soft-tissue]
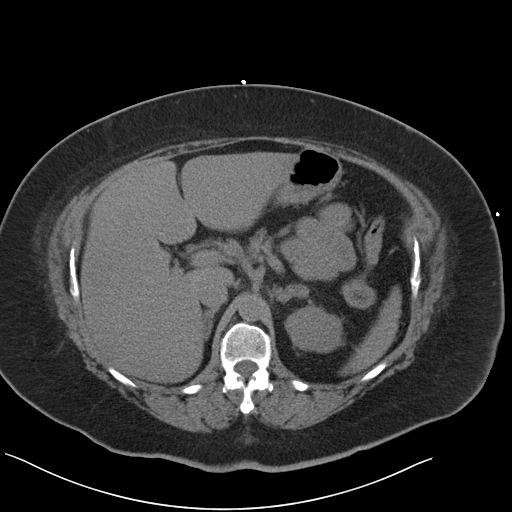
[im 75/98  soft-tissue]
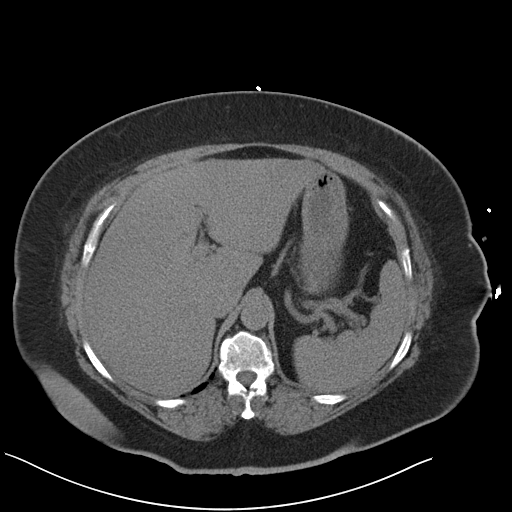
[im 86/98  soft-tissue]
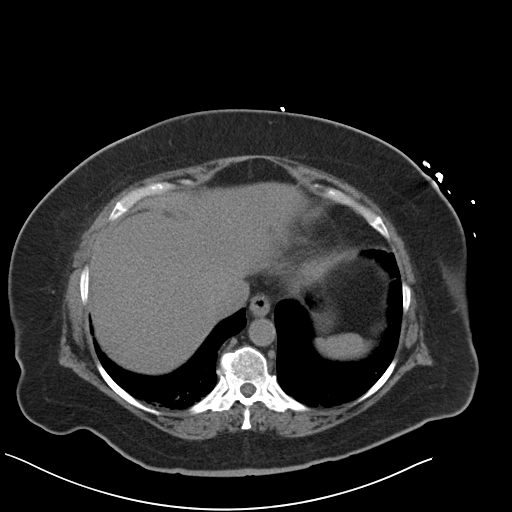
[im 92/98  soft-tissue]
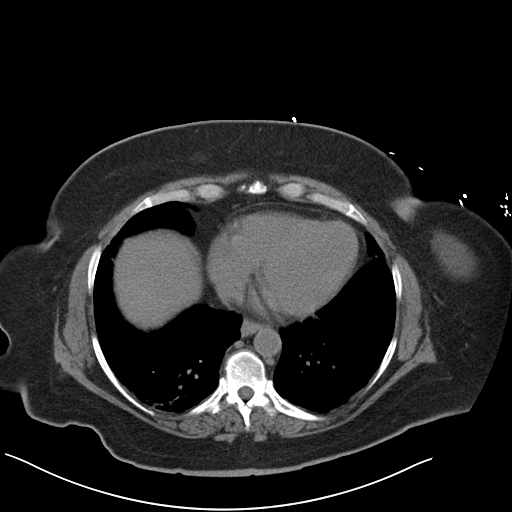

[Series 3: coronal · coronal · 0.93mm/px · 3 of 181 slices shown]
[im 61/181  soft-tissue]
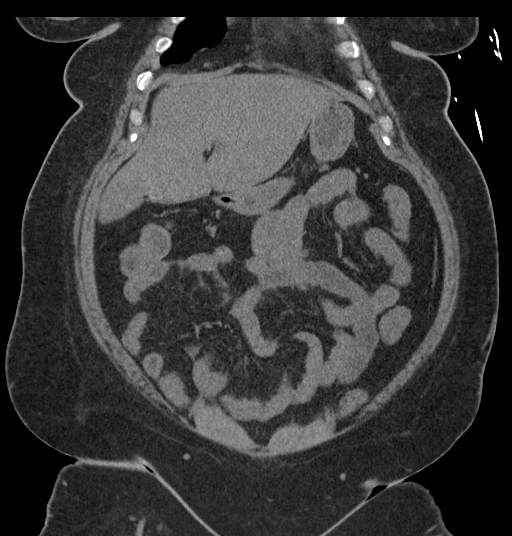
[im 81/181  soft-tissue]
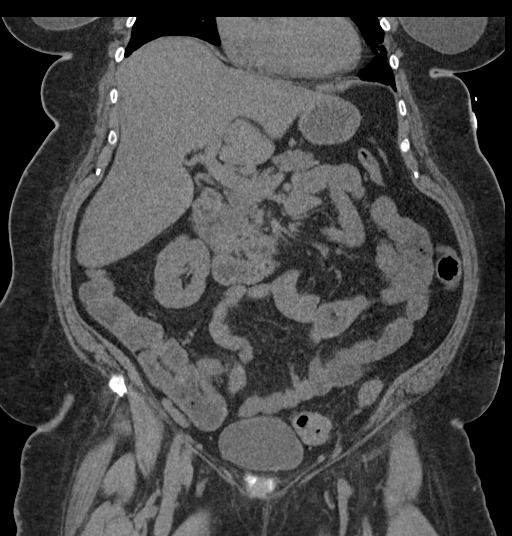
[im 101/181  soft-tissue]
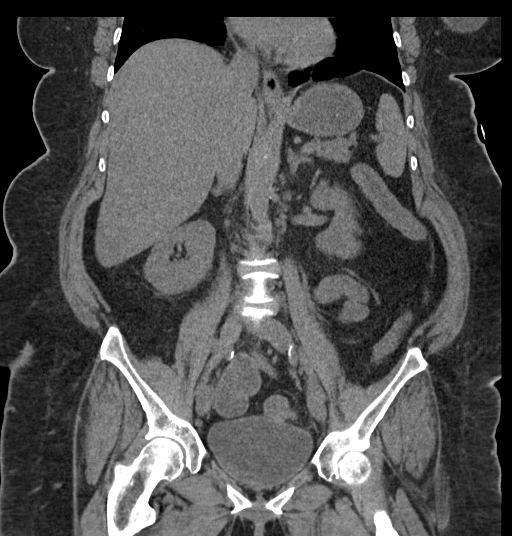

[16 of 46 positions shown; findings below may reference images not displayed]

FINDINGS: Lower chest: Mild linear atelectasis at the lung bases. 7 mm nodule
in the RIGHT lower lobe along the pleural surface (image number 6,
series 6).

Hepatobiliary: No focal hepatic lesion. Postcholecystectomy. No
biliary dilatation.

Pancreas: Pancreas is normal. No ductal dilatation. No pancreatic
inflammation.

Spleen: Normal spleen

Adrenals/urinary tract: Adrenal glands and kidneys are normal. The
ureters and bladder normal.

Stomach/Bowel: Stomach, small-bowel cecum normal. Colon is normal.
Rectum normal.

Vascular/Lymphatic: Abdominal aorta is normal caliber with
atherosclerotic calcification. There is no retroperitoneal or
periportal lymphadenopathy. No pelvic lymphadenopathy.

Reproductive: Post hysterectomy.

Other: Trace free fluid in the posterior cul-de-sac (image 79,
series 2).

Musculoskeletal: No aggressive osseous lesion.
IMPRESSION: 1. No evidence of abdominopelvic infection.
2. Trace free fluid in the pelvis.
3. Mild basilar atelectasis.
4. RIGHT lower lobe lobe pulmonary nodule. Non-contrast chest CT at
6-12 months is recommended. If the nodule is stable at time of
repeat CT, then future CT at 18-24 months (from today's scan) is
considered optional for low-risk patients, but is recommended for
high-risk patients. This recommendation follows the consensus
statement: Guidelines for Management of Incidental Pulmonary Nodules
Detected on CT Images: From the [HOSPITAL] 2442; Radiology

## 2017-11-07 DIAGNOSIS — R69 Illness, unspecified: Secondary | ICD-10-CM | POA: Diagnosis not present

## 2017-11-07 DIAGNOSIS — F430 Acute stress reaction: Secondary | ICD-10-CM | POA: Diagnosis not present

## 2017-11-08 DIAGNOSIS — E1142 Type 2 diabetes mellitus with diabetic polyneuropathy: Secondary | ICD-10-CM | POA: Diagnosis not present

## 2017-11-08 DIAGNOSIS — Z720 Tobacco use: Secondary | ICD-10-CM | POA: Diagnosis not present

## 2017-11-08 DIAGNOSIS — I10 Essential (primary) hypertension: Secondary | ICD-10-CM | POA: Diagnosis not present

## 2017-11-08 DIAGNOSIS — Z880 Allergy status to penicillin: Secondary | ICD-10-CM | POA: Diagnosis not present

## 2017-11-08 DIAGNOSIS — E785 Hyperlipidemia, unspecified: Secondary | ICD-10-CM | POA: Diagnosis not present

## 2017-11-08 DIAGNOSIS — E039 Hypothyroidism, unspecified: Secondary | ICD-10-CM | POA: Diagnosis not present

## 2017-11-08 DIAGNOSIS — R69 Illness, unspecified: Secondary | ICD-10-CM | POA: Diagnosis not present

## 2017-11-08 DIAGNOSIS — G4730 Sleep apnea, unspecified: Secondary | ICD-10-CM | POA: Diagnosis not present

## 2017-11-08 DIAGNOSIS — Z794 Long term (current) use of insulin: Secondary | ICD-10-CM | POA: Diagnosis not present

## 2017-11-15 DIAGNOSIS — E1129 Type 2 diabetes mellitus with other diabetic kidney complication: Secondary | ICD-10-CM | POA: Diagnosis not present

## 2017-11-17 DIAGNOSIS — Z720 Tobacco use: Secondary | ICD-10-CM | POA: Diagnosis not present

## 2017-11-17 DIAGNOSIS — E7849 Other hyperlipidemia: Secondary | ICD-10-CM | POA: Diagnosis not present

## 2017-11-17 DIAGNOSIS — Z794 Long term (current) use of insulin: Secondary | ICD-10-CM | POA: Diagnosis not present

## 2017-11-17 DIAGNOSIS — E1129 Type 2 diabetes mellitus with other diabetic kidney complication: Secondary | ICD-10-CM | POA: Diagnosis not present

## 2017-11-17 DIAGNOSIS — G4733 Obstructive sleep apnea (adult) (pediatric): Secondary | ICD-10-CM | POA: Diagnosis not present

## 2017-11-17 DIAGNOSIS — I10 Essential (primary) hypertension: Secondary | ICD-10-CM | POA: Diagnosis not present

## 2017-11-17 DIAGNOSIS — Z6841 Body Mass Index (BMI) 40.0 and over, adult: Secondary | ICD-10-CM | POA: Diagnosis not present

## 2017-11-27 DIAGNOSIS — R69 Illness, unspecified: Secondary | ICD-10-CM | POA: Diagnosis not present

## 2017-11-27 DIAGNOSIS — F430 Acute stress reaction: Secondary | ICD-10-CM | POA: Diagnosis not present

## 2017-12-04 DIAGNOSIS — E1165 Type 2 diabetes mellitus with hyperglycemia: Secondary | ICD-10-CM | POA: Diagnosis not present

## 2017-12-12 DIAGNOSIS — I10 Essential (primary) hypertension: Secondary | ICD-10-CM | POA: Diagnosis not present

## 2017-12-12 DIAGNOSIS — Z794 Long term (current) use of insulin: Secondary | ICD-10-CM | POA: Diagnosis not present

## 2017-12-12 DIAGNOSIS — E1129 Type 2 diabetes mellitus with other diabetic kidney complication: Secondary | ICD-10-CM | POA: Diagnosis not present

## 2017-12-12 DIAGNOSIS — Z4681 Encounter for fitting and adjustment of insulin pump: Secondary | ICD-10-CM | POA: Diagnosis not present

## 2017-12-12 DIAGNOSIS — Z23 Encounter for immunization: Secondary | ICD-10-CM | POA: Diagnosis not present

## 2017-12-15 DIAGNOSIS — E1129 Type 2 diabetes mellitus with other diabetic kidney complication: Secondary | ICD-10-CM | POA: Diagnosis not present

## 2017-12-18 ENCOUNTER — Ambulatory Visit: Payer: Medicare HMO | Admitting: Podiatry

## 2017-12-18 DIAGNOSIS — F430 Acute stress reaction: Secondary | ICD-10-CM | POA: Diagnosis not present

## 2017-12-18 DIAGNOSIS — I70235 Atherosclerosis of native arteries of right leg with ulceration of other part of foot: Secondary | ICD-10-CM | POA: Diagnosis not present

## 2017-12-18 DIAGNOSIS — M79676 Pain in unspecified toe(s): Secondary | ICD-10-CM | POA: Diagnosis not present

## 2017-12-18 DIAGNOSIS — E0842 Diabetes mellitus due to underlying condition with diabetic polyneuropathy: Secondary | ICD-10-CM

## 2017-12-18 DIAGNOSIS — B351 Tinea unguium: Secondary | ICD-10-CM | POA: Diagnosis not present

## 2017-12-18 DIAGNOSIS — R69 Illness, unspecified: Secondary | ICD-10-CM | POA: Diagnosis not present

## 2017-12-18 DIAGNOSIS — L989 Disorder of the skin and subcutaneous tissue, unspecified: Secondary | ICD-10-CM

## 2017-12-19 NOTE — Progress Notes (Signed)
    Subjective: Patient is a 62 y.o. female presenting to the office today for a chief complaint of painful callus lesions to the bilateral feet that have been present for several months. Bearing weight increases the pain. She has not had any treatment at home.  Patient also complains of elongated, thickened nails that cause pain while ambulating in shoes. She is unable to trim her own nails. Patient presents today for further treatment and evaluation.   Past Medical History:  Diagnosis Date  . Anxiety   . Arthritis    knees  . Coronary artery disease   . Depression   . Fatty liver   . History of nuclear stress test 12/16/2011   exercise myoview; normal images with 2-38mm ST-segment depression - subsequent cath revelaed subtotally occluded small 2nd marginal branch & 75% PDA lesion, normal LV function  . Hypertension   . Hypothyroidism   . Neuropathy    in feet  . OSA on CPAP    AHI = 44 (per patient) setting 13 per pt  . Personal history of kidney stones   . Primary localized osteoarthritis of left knee 04/24/2015  . Primary localized osteoarthritis of right knee 07/02/2015  . Tobacco abuse   . Type 2 diabetes mellitus (HCC)    insulin pump    Objective:  Physical Exam General: Alert and oriented x3 in no acute distress  Dermatology: Hyperkeratotic lesions present on the right foot x 1. Pain on palpation with a central nucleated core noted. Skin is warm, dry and supple bilateral lower extremities. Negative for open lesions or macerations. Nails are tender, long, thickened and dystrophic with subungual debris, consistent with onychomycosis, 1-5 bilateral. No signs of infection noted.  Vascular: Palpable pedal pulses bilaterally. No edema or erythema noted. Capillary refill within normal limits.  Neurological: Epicritic and protective threshold diminished bilaterally.   Musculoskeletal Exam: Pain on palpation at the keratotic lesion noted. Range of motion within normal limits  bilateral. Muscle strength 5/5 in all groups bilateral.  Assessment: 1. Onychodystrophic nails 1-5 left, 1, 4 and 5 right with hyperkeratosis of nails.  2. Onychomycosis of nail due to dermatophyte bilateral 3. Pre-ulcerative callus lesions to the right foot x 1 4. H/o toe amputations 2-3 right    Plan of Care:  #1 Patient evaluated. #2 Excisional debridement of keratoic lesion using a chisel blade was performed without incident.  #3 Dressed with light dressing. #4 Mechanical debridement of nails 1-5 left, 1, 4 and 5 right performed using a nail nipper. Filed with dremel without incident.  #5 Patient is to return to the clinic in 3 months.   Edrick Kins, DPM [facility]  Dr. Edrick Kins, [address]                                        [place], [place] [address]                Office [phone]  Fax [phone]

## 2017-12-21 DIAGNOSIS — I10 Essential (primary) hypertension: Secondary | ICD-10-CM | POA: Diagnosis not present

## 2017-12-21 DIAGNOSIS — G4730 Sleep apnea, unspecified: Secondary | ICD-10-CM | POA: Diagnosis not present

## 2017-12-21 DIAGNOSIS — Z6841 Body Mass Index (BMI) 40.0 and over, adult: Secondary | ICD-10-CM | POA: Diagnosis not present

## 2017-12-21 DIAGNOSIS — R69 Illness, unspecified: Secondary | ICD-10-CM | POA: Diagnosis not present

## 2017-12-21 DIAGNOSIS — E1159 Type 2 diabetes mellitus with other circulatory complications: Secondary | ICD-10-CM | POA: Diagnosis not present

## 2017-12-21 DIAGNOSIS — E1169 Type 2 diabetes mellitus with other specified complication: Secondary | ICD-10-CM | POA: Diagnosis not present

## 2018-01-08 DIAGNOSIS — F430 Acute stress reaction: Secondary | ICD-10-CM | POA: Diagnosis not present

## 2018-01-08 DIAGNOSIS — R69 Illness, unspecified: Secondary | ICD-10-CM | POA: Diagnosis not present

## 2018-01-11 DIAGNOSIS — E038 Other specified hypothyroidism: Secondary | ICD-10-CM | POA: Diagnosis not present

## 2018-01-11 DIAGNOSIS — Z794 Long term (current) use of insulin: Secondary | ICD-10-CM | POA: Diagnosis not present

## 2018-01-11 DIAGNOSIS — E7849 Other hyperlipidemia: Secondary | ICD-10-CM | POA: Diagnosis not present

## 2018-01-11 DIAGNOSIS — E1129 Type 2 diabetes mellitus with other diabetic kidney complication: Secondary | ICD-10-CM | POA: Diagnosis not present

## 2018-01-11 DIAGNOSIS — G609 Hereditary and idiopathic neuropathy, unspecified: Secondary | ICD-10-CM | POA: Diagnosis not present

## 2018-01-11 DIAGNOSIS — Z89429 Acquired absence of other toe(s), unspecified side: Secondary | ICD-10-CM | POA: Diagnosis not present

## 2018-01-11 DIAGNOSIS — G629 Polyneuropathy, unspecified: Secondary | ICD-10-CM | POA: Diagnosis not present

## 2018-01-11 DIAGNOSIS — J984 Other disorders of lung: Secondary | ICD-10-CM | POA: Diagnosis not present

## 2018-01-11 DIAGNOSIS — I10 Essential (primary) hypertension: Secondary | ICD-10-CM | POA: Diagnosis not present

## 2018-01-11 DIAGNOSIS — G4733 Obstructive sleep apnea (adult) (pediatric): Secondary | ICD-10-CM | POA: Diagnosis not present

## 2018-01-15 ENCOUNTER — Other Ambulatory Visit (HOSPITAL_COMMUNITY): Payer: Self-pay | Admitting: Surgery

## 2018-01-16 DIAGNOSIS — E1129 Type 2 diabetes mellitus with other diabetic kidney complication: Secondary | ICD-10-CM | POA: Diagnosis not present

## 2018-01-29 DIAGNOSIS — F430 Acute stress reaction: Secondary | ICD-10-CM | POA: Diagnosis not present

## 2018-01-29 DIAGNOSIS — R69 Illness, unspecified: Secondary | ICD-10-CM | POA: Diagnosis not present

## 2018-02-01 ENCOUNTER — Ambulatory Visit (HOSPITAL_COMMUNITY)
Admission: RE | Admit: 2018-02-01 | Discharge: 2018-02-01 | Disposition: A | Discharge status: Home / Self Care | Payer: Medicare HMO | Source: Ambulatory Visit | Attending: Surgery | Admitting: Surgery

## 2018-02-01 DIAGNOSIS — K449 Diaphragmatic hernia without obstruction or gangrene: Secondary | ICD-10-CM | POA: Diagnosis not present

## 2018-02-01 DIAGNOSIS — Z01818 Encounter for other preprocedural examination: Secondary | ICD-10-CM | POA: Diagnosis not present

## 2018-02-06 ENCOUNTER — Encounter: Payer: Medicare HMO | Attending: Surgery | Admitting: Skilled Nursing Facility1

## 2018-02-06 DIAGNOSIS — E669 Obesity, unspecified: Secondary | ICD-10-CM | POA: Insufficient documentation

## 2018-02-06 DIAGNOSIS — E1110 Type 2 diabetes mellitus with ketoacidosis without coma: Secondary | ICD-10-CM

## 2018-02-06 DIAGNOSIS — Z713 Dietary counseling and surveillance: Secondary | ICD-10-CM | POA: Diagnosis not present

## 2018-02-06 DIAGNOSIS — Z6841 Body Mass Index (BMI) 40.0 and over, adult: Secondary | ICD-10-CM | POA: Diagnosis not present

## 2018-02-06 NOTE — Progress Notes (Signed)
Pre-Op Assessment Visit:  Pre-Operative RYGB Surgery  Patient was seen on 02/06/2018  for Pre-Operative Nutrition Assessment. Assessment and letter of approval faxed to [facility] Surgery Bariatric Surgery Program coordinator on 02/06/2018.   Pt states her insurance will change in January. Pt states she is tired of not being able to wipe her butt in stating why she needs the surgery. Pt states she does not look like who she is. Pt states she has had diabetes since 2003 stating her A1C is about 7.4 or 7.7. with her numbers being from 89-213: lower numbers in the lat afternoon due to not eating. Pt states she wears her CPAP every night and for naps. Pt was a respiratory therapist. Pt states her husband told her he was not attracted to her.  Pt states she is on an insulin pump and the libre.   According to pts referral: 4 SWL consecutive  Pt expectation of surgery: to be healthy   Pt expectation of Dietitian: none identified   Start weight at [facility]: 256.7 BMI: 40.87  24 hr Dietary Recall: typically having 1 meal a day  First Meal: coffee  Snack:  Second Meal: 3-4pm: roast and mashed potatoes and green beans  Snack:  Third Meal: Snack:  Beverages: coffee, sugar free flavored water  Encouraged to engage in 150 minutes of moderate physical activity including cardiovascular and weight baring weekly  Handouts given during visit include:  . Pre-Op Goals . Bariatric Surgery Protein Shakes During the appointment today the following Pre-Op Goals were reviewed with the patient: . Log food and beverage . Make healthy food choices . Begin to limit portion sizes . Limited concentrated sugars and fried foods . Keep fat/sugar in the single digits per serving on             food labels . Practice CHEWING your food  (aim for 30 chews per bite or until applesauce consistency) . Practice not drinking 15 minutes before, during, and 30 minutes after each meal/snack . Avoid all carbonated  beverages  . Avoid/limit caffeinated beverages  . Avoid all sugar-sweetened beverages . Consume 3 meals per day; eat every 3-5 hours . Make a list of non-food related activities . Aim for 64-100 ounces of FLUID daily  . Aim for at least 60-80 grams of PROTEIN daily . Look for a liquid protein source that contain ?15 g protein and ?5 g carbohydrate  (ex: shakes, drinks, shots)  -Follow diet recommendations listed below   Energy and Macronutrient Recomendations: Calories: 1500 Carbohydrate: 170 Protein: 112 Fat: 42  Demonstrated degree of understanding via:  Teach Back  Teaching Method Utilized:  Visual Auditory Hands on  Barriers to learning/adherence to lifestyle change: unidentified at this time  Patient to call the Nutrition and Diabetes Education Services to enroll in Pre-Op and Post-Op Nutrition Education when surgery date is scheduled.

## 2018-02-16 DIAGNOSIS — E1129 Type 2 diabetes mellitus with other diabetic kidney complication: Secondary | ICD-10-CM | POA: Diagnosis not present

## 2018-02-19 DIAGNOSIS — F430 Acute stress reaction: Secondary | ICD-10-CM | POA: Diagnosis not present

## 2018-02-19 DIAGNOSIS — R69 Illness, unspecified: Secondary | ICD-10-CM | POA: Diagnosis not present

## 2018-03-01 ENCOUNTER — Other Ambulatory Visit: Payer: Self-pay | Admitting: Internal Medicine

## 2018-03-01 DIAGNOSIS — Z720 Tobacco use: Secondary | ICD-10-CM

## 2018-03-09 ENCOUNTER — Ambulatory Visit: Payer: 59

## 2018-03-09 ENCOUNTER — Ambulatory Visit
Admission: RE | Admit: 2018-03-09 | Discharge: 2018-03-09 | Disposition: A | Discharge status: Home / Self Care | Payer: Medicare HMO | Source: Ambulatory Visit | Attending: Internal Medicine | Admitting: Internal Medicine

## 2018-03-09 DIAGNOSIS — F17210 Nicotine dependence, cigarettes, uncomplicated: Secondary | ICD-10-CM | POA: Diagnosis not present

## 2018-03-09 DIAGNOSIS — Z720 Tobacco use: Secondary | ICD-10-CM

## 2018-03-13 DIAGNOSIS — I10 Essential (primary) hypertension: Secondary | ICD-10-CM | POA: Diagnosis not present

## 2018-03-13 DIAGNOSIS — E1129 Type 2 diabetes mellitus with other diabetic kidney complication: Secondary | ICD-10-CM | POA: Diagnosis not present

## 2018-03-13 DIAGNOSIS — Z4681 Encounter for fitting and adjustment of insulin pump: Secondary | ICD-10-CM | POA: Diagnosis not present

## 2018-03-13 DIAGNOSIS — Z794 Long term (current) use of insulin: Secondary | ICD-10-CM | POA: Diagnosis not present

## 2018-03-13 DIAGNOSIS — Z720 Tobacco use: Secondary | ICD-10-CM | POA: Diagnosis not present

## 2018-03-13 DIAGNOSIS — F17211 Nicotine dependence, cigarettes, in remission: Secondary | ICD-10-CM | POA: Diagnosis not present

## 2018-03-19 DIAGNOSIS — F329 Major depressive disorder, single episode, unspecified: Secondary | ICD-10-CM | POA: Diagnosis not present

## 2018-03-19 DIAGNOSIS — F430 Acute stress reaction: Secondary | ICD-10-CM | POA: Diagnosis not present

## 2018-03-21 ENCOUNTER — Encounter: Payer: Self-pay | Admitting: Podiatry

## 2018-03-21 ENCOUNTER — Ambulatory Visit: Payer: Medicare HMO | Admitting: Podiatry

## 2018-03-21 DIAGNOSIS — E0842 Diabetes mellitus due to underlying condition with diabetic polyneuropathy: Secondary | ICD-10-CM | POA: Diagnosis not present

## 2018-03-21 DIAGNOSIS — B351 Tinea unguium: Secondary | ICD-10-CM

## 2018-03-21 DIAGNOSIS — M79676 Pain in unspecified toe(s): Secondary | ICD-10-CM

## 2018-03-28 NOTE — Progress Notes (Signed)
    Subjective: Patient is a 63 y.o. female presenting to the office today for a chief complaint of of elongated, thickened nails that cause pain while ambulating in shoes. She is unable to trim her own nails. Patient presents today for further treatment and evaluation.   Past Medical History:  Diagnosis Date  . Anxiety   . Arthritis    knees  . Coronary artery disease   . Depression   . Fatty liver   . History of nuclear stress test 12/16/2011   exercise myoview; normal images with 2-37mm ST-segment depression - subsequent cath revelaed subtotally occluded small 2nd marginal branch & 75% PDA lesion, normal LV function  . Hypertension   . Hypothyroidism   . Neuropathy    in feet  . OSA on CPAP    AHI = 44 (per patient) setting 13 per pt  . Personal history of kidney stones   . Primary localized osteoarthritis of left knee 04/24/2015  . Primary localized osteoarthritis of right knee 07/02/2015  . Tobacco abuse   . Type 2 diabetes mellitus (HCC)    insulin pump    Objective:  Physical Exam General: Alert and oriented x3 in no acute distress  Dermatology: Skin is warm, dry and supple bilateral lower extremities. Negative for open lesions or macerations. Nails are tender, long, thickened and dystrophic with subungual debris, consistent with digits 1-5 left, 1, 4 and 5 right. No signs of infection noted.  Vascular: Palpable pedal pulses bilaterally. No edema or erythema noted. Capillary refill within normal limits.  Neurological: Epicritic and protective threshold diminished bilaterally.   Musculoskeletal Exam: Range of motion within normal limits bilateral. Muscle strength 5/5 in all groups bilateral.  Assessment: 1. Onychodystrophic nails 1-5 left, 1, 4 and 5 right with hyperkeratosis of nails.  2. Onychomycosis of nail due to dermatophyte bilateral 3. H/o toe amputations 2-3 right    Plan of Care:  #1 Patient evaluated. #2 Mechanical debridement of nails 1-5 left, 1, 4 and  5 right performed using a nail nipper. Filed with dremel without incident.  #3 Patient is to return to the clinic in 3 months.   Edrick Kins, DPM [facility]  Dr. Edrick Kins, [address]                                        [place], [place] [address]                Office [phone]  Fax [phone]

## 2018-03-29 DIAGNOSIS — E1129 Type 2 diabetes mellitus with other diabetic kidney complication: Secondary | ICD-10-CM | POA: Diagnosis not present

## 2018-04-05 DIAGNOSIS — E1129 Type 2 diabetes mellitus with other diabetic kidney complication: Secondary | ICD-10-CM | POA: Diagnosis not present

## 2018-04-16 DIAGNOSIS — F329 Major depressive disorder, single episode, unspecified: Secondary | ICD-10-CM | POA: Diagnosis not present

## 2018-04-16 DIAGNOSIS — F430 Acute stress reaction: Secondary | ICD-10-CM | POA: Diagnosis not present

## 2018-05-09 ENCOUNTER — Ambulatory Visit: Payer: Medicare HMO | Admitting: Psychiatry

## 2018-05-11 DIAGNOSIS — F430 Acute stress reaction: Secondary | ICD-10-CM | POA: Diagnosis not present

## 2018-05-11 DIAGNOSIS — F329 Major depressive disorder, single episode, unspecified: Secondary | ICD-10-CM | POA: Diagnosis not present

## 2018-05-15 DIAGNOSIS — E1129 Type 2 diabetes mellitus with other diabetic kidney complication: Secondary | ICD-10-CM | POA: Diagnosis not present

## 2018-05-16 DIAGNOSIS — Z794 Long term (current) use of insulin: Secondary | ICD-10-CM | POA: Diagnosis not present

## 2018-05-16 DIAGNOSIS — E7849 Other hyperlipidemia: Secondary | ICD-10-CM | POA: Diagnosis not present

## 2018-05-16 DIAGNOSIS — I10 Essential (primary) hypertension: Secondary | ICD-10-CM | POA: Diagnosis not present

## 2018-05-16 DIAGNOSIS — E038 Other specified hypothyroidism: Secondary | ICD-10-CM | POA: Diagnosis not present

## 2018-05-16 DIAGNOSIS — G629 Polyneuropathy, unspecified: Secondary | ICD-10-CM | POA: Diagnosis not present

## 2018-05-16 DIAGNOSIS — J984 Other disorders of lung: Secondary | ICD-10-CM | POA: Diagnosis not present

## 2018-05-16 DIAGNOSIS — G4733 Obstructive sleep apnea (adult) (pediatric): Secondary | ICD-10-CM | POA: Diagnosis not present

## 2018-05-16 DIAGNOSIS — E1129 Type 2 diabetes mellitus with other diabetic kidney complication: Secondary | ICD-10-CM | POA: Diagnosis not present

## 2018-05-16 DIAGNOSIS — G609 Hereditary and idiopathic neuropathy, unspecified: Secondary | ICD-10-CM | POA: Diagnosis not present

## 2018-05-24 ENCOUNTER — Ambulatory Visit: Payer: Medicare HMO | Admitting: Psychiatry

## 2018-05-29 DIAGNOSIS — F329 Major depressive disorder, single episode, unspecified: Secondary | ICD-10-CM | POA: Diagnosis not present

## 2018-05-29 DIAGNOSIS — F430 Acute stress reaction: Secondary | ICD-10-CM | POA: Diagnosis not present

## 2018-06-05 ENCOUNTER — Ambulatory Visit: Payer: Medicare HMO | Admitting: Psychiatry

## 2018-06-12 DIAGNOSIS — E1129 Type 2 diabetes mellitus with other diabetic kidney complication: Secondary | ICD-10-CM | POA: Diagnosis not present

## 2018-06-12 DIAGNOSIS — Z794 Long term (current) use of insulin: Secondary | ICD-10-CM | POA: Diagnosis not present

## 2018-06-12 DIAGNOSIS — I10 Essential (primary) hypertension: Secondary | ICD-10-CM | POA: Diagnosis not present

## 2018-06-15 ENCOUNTER — Ambulatory Visit: Payer: 59 | Admitting: Psychology

## 2018-06-15 DIAGNOSIS — E1129 Type 2 diabetes mellitus with other diabetic kidney complication: Secondary | ICD-10-CM | POA: Diagnosis not present

## 2018-06-18 ENCOUNTER — Encounter: Payer: Self-pay | Admitting: Podiatry

## 2018-06-18 ENCOUNTER — Ambulatory Visit: Payer: Medicare HMO | Admitting: Podiatry

## 2018-06-18 ENCOUNTER — Other Ambulatory Visit: Payer: Self-pay

## 2018-06-18 VITALS — Temp 96.4°F

## 2018-06-18 DIAGNOSIS — B351 Tinea unguium: Secondary | ICD-10-CM

## 2018-06-18 DIAGNOSIS — E0842 Diabetes mellitus due to underlying condition with diabetic polyneuropathy: Secondary | ICD-10-CM

## 2018-06-18 DIAGNOSIS — M79676 Pain in unspecified toe(s): Secondary | ICD-10-CM | POA: Diagnosis not present

## 2018-06-18 DIAGNOSIS — L989 Disorder of the skin and subcutaneous tissue, unspecified: Secondary | ICD-10-CM | POA: Diagnosis not present

## 2018-06-19 DIAGNOSIS — F329 Major depressive disorder, single episode, unspecified: Secondary | ICD-10-CM | POA: Diagnosis not present

## 2018-06-19 DIAGNOSIS — F430 Acute stress reaction: Secondary | ICD-10-CM | POA: Diagnosis not present

## 2018-06-20 ENCOUNTER — Ambulatory Visit: Payer: Medicare HMO | Admitting: Podiatry

## 2018-06-22 ENCOUNTER — Ambulatory Visit: Payer: Medicare HMO | Admitting: Psychology

## 2018-06-25 NOTE — Progress Notes (Signed)
    Subjective: Patient is a 63 y.o. female presenting to the office today for a chief complaint of of elongated, thickened nails that cause pain while ambulating in shoes. She is unable to trim her own nails. Patient presents today for further treatment and evaluation.   Past Medical History:  Diagnosis Date  . Anxiety   . Arthritis    knees  . Coronary artery disease   . Depression   . Fatty liver   . History of nuclear stress test 12/16/2011   exercise myoview; normal images with 2-54mm ST-segment depression - subsequent cath revelaed subtotally occluded small 2nd marginal branch & 75% PDA lesion, normal LV function  . Hypertension   . Hypothyroidism   . Neuropathy    in feet  . OSA on CPAP    AHI = 44 (per patient) setting 13 per pt  . Personal history of kidney stones   . Primary localized osteoarthritis of left knee 04/24/2015  . Primary localized osteoarthritis of right knee 07/02/2015  . Tobacco abuse   . Type 2 diabetes mellitus (HCC)    insulin pump    Objective:  Physical Exam General: Alert and oriented x3 in no acute distress  Dermatology: Skin is warm, dry and supple bilateral lower extremities. Negative for open lesions or macerations. Nails are tender, long, thickened and dystrophic with subungual debris, consistent with digits 1-5 left, 1, 4 and 5 right. No signs of infection noted.  Hyperkeratotic callus lesions noted to the bilateral feet x3 concerning for pre-ulcerative callus  Vascular: Palpable pedal pulses bilaterally. No edema or erythema noted. Capillary refill within normal limits.  Neurological: Epicritic and protective threshold diminished bilaterally.   Musculoskeletal Exam: Range of motion within normal limits bilateral. Muscle strength 5/5 in all groups bilateral.  Assessment: 1. Onychodystrophic nails 1-5 left, 1, 4 and 5 right with hyperkeratosis of nails.  2. Onychomycosis of nail due to dermatophyte bilateral 3. H/o toe amputations 2-3 right   4.  Pre-ulcerative callus lesions lateral x3   Plan of Care:  #1 Patient evaluated. #2 Mechanical debridement of nails 1-5 left, 1, 4 and 5 right performed using a nail nipper. Filed with dremel without incident.  #3  Excisional debridement of the hyperkeratotic callus lesions was performed using a chisel blade without incident or bleeding  #4 patient is to return to the clinic in 3 months.   Edrick Kins, DPM [facility]  Dr. Edrick Kins, [address]                                        [place], [place] [address]                Office [phone]  Fax [phone]

## 2018-07-04 DIAGNOSIS — E1129 Type 2 diabetes mellitus with other diabetic kidney complication: Secondary | ICD-10-CM | POA: Diagnosis not present

## 2018-07-17 DIAGNOSIS — F329 Major depressive disorder, single episode, unspecified: Secondary | ICD-10-CM | POA: Diagnosis not present

## 2018-07-17 DIAGNOSIS — F430 Acute stress reaction: Secondary | ICD-10-CM | POA: Diagnosis not present

## 2018-07-25 ENCOUNTER — Ambulatory Visit: Payer: Medicare HMO | Admitting: Psychology

## 2018-07-30 DIAGNOSIS — E1129 Type 2 diabetes mellitus with other diabetic kidney complication: Secondary | ICD-10-CM | POA: Diagnosis not present

## 2018-08-03 DIAGNOSIS — F430 Acute stress reaction: Secondary | ICD-10-CM | POA: Diagnosis not present

## 2018-08-03 DIAGNOSIS — F329 Major depressive disorder, single episode, unspecified: Secondary | ICD-10-CM | POA: Diagnosis not present

## 2018-08-27 DIAGNOSIS — F329 Major depressive disorder, single episode, unspecified: Secondary | ICD-10-CM | POA: Diagnosis not present

## 2018-08-27 DIAGNOSIS — F430 Acute stress reaction: Secondary | ICD-10-CM | POA: Diagnosis not present

## 2018-08-29 DIAGNOSIS — E1129 Type 2 diabetes mellitus with other diabetic kidney complication: Secondary | ICD-10-CM | POA: Diagnosis not present

## 2018-09-03 ENCOUNTER — Ambulatory Visit: Payer: Medicare HMO | Admitting: Psychology

## 2018-09-11 DIAGNOSIS — I10 Essential (primary) hypertension: Secondary | ICD-10-CM | POA: Diagnosis not present

## 2018-09-11 DIAGNOSIS — Z4681 Encounter for fitting and adjustment of insulin pump: Secondary | ICD-10-CM | POA: Diagnosis not present

## 2018-09-11 DIAGNOSIS — E1129 Type 2 diabetes mellitus with other diabetic kidney complication: Secondary | ICD-10-CM | POA: Diagnosis not present

## 2018-09-11 DIAGNOSIS — Z794 Long term (current) use of insulin: Secondary | ICD-10-CM | POA: Diagnosis not present

## 2018-09-13 DIAGNOSIS — E1129 Type 2 diabetes mellitus with other diabetic kidney complication: Secondary | ICD-10-CM | POA: Diagnosis not present

## 2018-09-13 DIAGNOSIS — I10 Essential (primary) hypertension: Secondary | ICD-10-CM | POA: Diagnosis not present

## 2018-09-17 ENCOUNTER — Ambulatory Visit: Payer: Medicare HMO | Admitting: Podiatry

## 2018-09-17 ENCOUNTER — Other Ambulatory Visit: Payer: Self-pay

## 2018-09-24 ENCOUNTER — Encounter: Payer: Self-pay | Admitting: Podiatry

## 2018-09-24 ENCOUNTER — Other Ambulatory Visit: Payer: Self-pay

## 2018-09-24 ENCOUNTER — Ambulatory Visit: Payer: Medicare HMO | Admitting: Podiatry

## 2018-09-24 VITALS — Temp 97.8°F

## 2018-09-24 DIAGNOSIS — L989 Disorder of the skin and subcutaneous tissue, unspecified: Secondary | ICD-10-CM | POA: Diagnosis not present

## 2018-09-24 DIAGNOSIS — E0842 Diabetes mellitus due to underlying condition with diabetic polyneuropathy: Secondary | ICD-10-CM | POA: Diagnosis not present

## 2018-09-24 DIAGNOSIS — B351 Tinea unguium: Secondary | ICD-10-CM

## 2018-09-24 DIAGNOSIS — M79676 Pain in unspecified toe(s): Secondary | ICD-10-CM

## 2018-09-26 NOTE — Progress Notes (Signed)
    Subjective: Patient is a 63 y.o. female presenting to the office today with a chief complaint of painful callus lesions noted to the bilateral feet that have been symptomatic for the past few months. Walking increases the pain. She has not had any treatment for her symptoms.  Patient also complains of elongated, thickened nails that cause pain while ambulating in shoes. She is unable to trim her own nails. Patient presents today for further treatment and evaluation.  Past Medical History:  Diagnosis Date  . Anxiety   . Arthritis    knees  . Coronary artery disease   . Depression   . Fatty liver   . History of nuclear stress test 12/16/2011   exercise myoview; normal images with 2-39mm ST-segment depression - subsequent cath revelaed subtotally occluded small 2nd marginal branch & 75% PDA lesion, normal LV function  . Hypertension   . Hypothyroidism   . Neuropathy    in feet  . OSA on CPAP    AHI = 44 (per patient) setting 13 per pt  . Personal history of kidney stones   . Primary localized osteoarthritis of left knee 04/24/2015  . Primary localized osteoarthritis of right knee 07/02/2015  . Tobacco abuse   . Type 2 diabetes mellitus (HCC)    insulin pump    Objective:  Physical Exam General: Alert and oriented x3 in no acute distress  Dermatology: Hyperkeratotic lesions present on the bilateral feet. Pain on palpation with a central nucleated core noted. Skin is warm, dry and supple bilateral lower extremities. Negative for open lesions or macerations. Nails are tender, long, thickened and dystrophic with subungual debris, consistent with onychomycosis, 1-5 bilateral. No signs of infection noted.  Vascular: Palpable pedal pulses bilaterally. No edema or erythema noted. Capillary refill within normal limits.  Neurological: Epicritic and protective threshold grossly intact bilaterally.   Musculoskeletal Exam: Pain on palpation at the keratotic lesion noted. Range of motion  within normal limits bilateral. Muscle strength 5/5 in all groups bilateral.  Assessment: 1. Onychodystrophic nails 1-5 bilateral with hyperkeratosis of nails.  2. Onychomycosis of nail due to dermatophyte bilateral 3. Pre-ulcerative callus lesions noted to the bilateral feet x 4   Plan of Care:  1. Patient evaluated. 2. Excisional debridement of keratoic lesion using a chisel blade was performed without incident.  3. Dressed with light dressing. 4. Mechanical debridement of nails 1-5 bilaterally performed using a nail nipper. Filed with dremel without incident.  5. Patient is to return to the clinic in 3 months.   Edrick Kins, DPM [facility]  Dr. Edrick Kins, [address]                                        [place], [place] [address]                Office [phone]  Fax [phone]

## 2018-10-01 ENCOUNTER — Ambulatory Visit: Payer: Medicare HMO | Admitting: Podiatry

## 2018-10-04 DIAGNOSIS — F430 Acute stress reaction: Secondary | ICD-10-CM | POA: Diagnosis not present

## 2018-10-04 DIAGNOSIS — E1129 Type 2 diabetes mellitus with other diabetic kidney complication: Secondary | ICD-10-CM | POA: Diagnosis not present

## 2018-10-04 DIAGNOSIS — F329 Major depressive disorder, single episode, unspecified: Secondary | ICD-10-CM | POA: Diagnosis not present

## 2018-10-12 DIAGNOSIS — L821 Other seborrheic keratosis: Secondary | ICD-10-CM | POA: Diagnosis not present

## 2018-10-12 DIAGNOSIS — L918 Other hypertrophic disorders of the skin: Secondary | ICD-10-CM | POA: Diagnosis not present

## 2018-10-12 DIAGNOSIS — L919 Hypertrophic disorder of the skin, unspecified: Secondary | ICD-10-CM | POA: Diagnosis not present

## 2018-10-12 DIAGNOSIS — E1129 Type 2 diabetes mellitus with other diabetic kidney complication: Secondary | ICD-10-CM | POA: Diagnosis not present

## 2018-11-05 DIAGNOSIS — F430 Acute stress reaction: Secondary | ICD-10-CM | POA: Diagnosis not present

## 2018-11-05 DIAGNOSIS — E038 Other specified hypothyroidism: Secondary | ICD-10-CM | POA: Diagnosis not present

## 2018-11-05 DIAGNOSIS — E1129 Type 2 diabetes mellitus with other diabetic kidney complication: Secondary | ICD-10-CM | POA: Diagnosis not present

## 2018-11-05 DIAGNOSIS — F329 Major depressive disorder, single episode, unspecified: Secondary | ICD-10-CM | POA: Diagnosis not present

## 2018-11-07 DIAGNOSIS — E785 Hyperlipidemia, unspecified: Secondary | ICD-10-CM | POA: Diagnosis not present

## 2018-11-07 DIAGNOSIS — E1129 Type 2 diabetes mellitus with other diabetic kidney complication: Secondary | ICD-10-CM | POA: Diagnosis not present

## 2018-11-07 DIAGNOSIS — Z794 Long term (current) use of insulin: Secondary | ICD-10-CM | POA: Diagnosis not present

## 2018-11-07 DIAGNOSIS — G609 Hereditary and idiopathic neuropathy, unspecified: Secondary | ICD-10-CM | POA: Diagnosis not present

## 2018-11-07 DIAGNOSIS — Z0000 Encounter for general adult medical examination without abnormal findings: Secondary | ICD-10-CM | POA: Diagnosis not present

## 2018-11-07 DIAGNOSIS — E039 Hypothyroidism, unspecified: Secondary | ICD-10-CM | POA: Diagnosis not present

## 2018-11-07 DIAGNOSIS — I10 Essential (primary) hypertension: Secondary | ICD-10-CM | POA: Diagnosis not present

## 2018-11-07 DIAGNOSIS — G4733 Obstructive sleep apnea (adult) (pediatric): Secondary | ICD-10-CM | POA: Diagnosis not present

## 2018-11-07 DIAGNOSIS — G629 Polyneuropathy, unspecified: Secondary | ICD-10-CM | POA: Diagnosis not present

## 2018-11-08 DIAGNOSIS — E1129 Type 2 diabetes mellitus with other diabetic kidney complication: Secondary | ICD-10-CM | POA: Diagnosis not present

## 2018-11-08 DIAGNOSIS — R82998 Other abnormal findings in urine: Secondary | ICD-10-CM | POA: Diagnosis not present

## 2018-11-08 DIAGNOSIS — Z23 Encounter for immunization: Secondary | ICD-10-CM | POA: Diagnosis not present

## 2018-11-12 DIAGNOSIS — E1129 Type 2 diabetes mellitus with other diabetic kidney complication: Secondary | ICD-10-CM | POA: Diagnosis not present

## 2018-11-26 DIAGNOSIS — F329 Major depressive disorder, single episode, unspecified: Secondary | ICD-10-CM | POA: Diagnosis not present

## 2018-11-26 DIAGNOSIS — F430 Acute stress reaction: Secondary | ICD-10-CM | POA: Diagnosis not present

## 2018-11-27 ENCOUNTER — Encounter: Payer: Medicare HMO | Attending: Internal Medicine | Admitting: Registered"

## 2018-11-27 ENCOUNTER — Encounter: Payer: Self-pay | Admitting: Registered"

## 2018-11-27 ENCOUNTER — Other Ambulatory Visit: Payer: Self-pay

## 2018-11-27 DIAGNOSIS — E1165 Type 2 diabetes mellitus with hyperglycemia: Secondary | ICD-10-CM | POA: Insufficient documentation

## 2018-11-27 NOTE — Patient Instructions (Addendum)
When you call your insurance to check coverages the diabetes education billing code is [phone] and the outpatient hospital services claim UB-04  Benecol butter spread can help reduce cholesterol.  Aim to eat balanced meals.  Before you start your day helping other people, consider eating a meal earlier in the day and include vegetables to help get the vitamins and minerals you need. Can use the snack sheet for some ideas.  When exercising keep taps on your blood sugar to make sure it doesn't go too low. Keep something sweet with you just in case you need it. Continue with your plan to do some swimming.

## 2018-11-27 NOTE — Progress Notes (Addendum)
Diabetes Self-Management Education  Visit Type: First/Initial  Appt. Start Time: 1010 Appt. End Time: 1110  11/27/2018  [name], identified by name and date of birth, is a 63 y.o. female with a diagnosis of Diabetes: Type 2.   ASSESSMENT  There were no vitals taken for this visit. There is no height or weight on file to calculate BMI.   Pt states she started silver sneakers x1 week and has seen immediate effect on BG now random checks is 106-121 mg/dL, before started exercise routine was 175-213 mg/dL.  Pt states she feels comfortable with her insulin routine and her pump. Counts carbs for bolus.   Pt states sometime she starts having low BG symptoms in the 90's. Pt has a CGM and we discussed she can see if BG is dropping. Pt states she skips meals, often first meal is 4 pm and has dinner ~8 pm. Pt states she is busy since retirement going out a lot to help other people.  Pt states her husband is tall and slim, keeps ice cream and sweets in his home office. Pt states her husband tells her he will do anything to help her (improve her health?), but he doesn't find her attractive. Pt states she has strong social support through church and friends. Pt states she doesn't like to go out with husband and his golfing friends because they are all attractive and she feels bad. Pt states last year she started the process of qualifying for bariatric surgery. Pt states COVID delayed her plans and in the meantime had someone tell her to consider the risks and she may not be interested in it anymore.  Pt states she has not wanted sweets as much as she used to, walks past in the grocery store and not tempted to put in cart,  but sometimes wants after she gets to car. Pt states she cooks with butter and olive oil, put butter on baked potatoes. Pt states she likes iceburg lettuce, but knows it doesn't have any nutrition. Pt states she likes all vegetables, but also likes dressings. [employer] blue cheese  dressing. Lite Kraft asian dressing  Pt states she takes 63 yr old lady every Thurs to get her hair done and they drink coffee and chat before going out and they have lunch at a deli after. Some of the places patient states she likes the food: Deli on Thurs, has rye bread with liverwurst. Likes Hops and [employer], truffle oil french fries. Loves potatoes.   Pt would like to have additional DM visits but has new insurance and not sure what they cover. If covered, pt states she will call to make another appointment.  Diabetes Self-Management Education - 11/27/18 1019      Visit Information   Visit Type  First/Initial      Initial Visit   Diabetes Type  Type 2    Are you currently following a meal plan?  No    Are you taking your medications as prescribed?  Yes    Date Diagnosed  2003      Health Coping   How would you rate your overall health?  Good      Psychosocial Assessment   Patient Belief/Attitude about Diabetes  Motivated to manage diabetes    How often do you need to have someone help you when you read instructions, pamphlets, or other written materials from your doctor or pharmacy?  1 - Never    What is the last grade  level you completed in school?  associates      Complications   Last HgB A1C per patient/outside source  7.8 %    How often do you check your blood sugar?  > 4 times/day    Fasting Blood glucose range (mg/dL)  70-129    Number of hypoglycemic episodes per month  0    Number of hyperglycemic episodes per week  0    Have you had a dilated eye exam in the past 12 months?  No    Have you had a dental exam in the past 12 months?  Yes    Are you checking your feet?  Yes    How many days per week are you checking your feet?  7      Dietary Intake   Breakfast  none    Snack (morning)  crackers and liverwurst.    Lunch  4 pm; liver pudding sandwich, w mayo, unsweet peach tea    Dinner  8 pm pork chop, vegetables OR fried oysters, onion rings    Snack  (evening)  chocolate covered raisins    Beverage(s)  coffee, sugar-free peach tea, water      Exercise   Exercise Type  Light (walking / raking leaves)    How many days per week to you exercise?  3    How many minutes per day do you exercise?  30    Total minutes per week of exercise  90      Patient Education   Previous Diabetes Education  Yes (please comment)   2018 [facility]   Nutrition management   Role of diet in the treatment of diabetes and the relationship between the three main macronutrients and blood glucose level    Physical activity and exercise   Role of exercise on diabetes management, blood pressure control and cardiac health.    Acute complications  Discussed and identified patients' treatment of hyperglycemia.      Individualized Goals (developed by patient)   Nutrition  General guidelines for healthy choices and portions discussed    Physical Activity  Exercise 3-5 times per week      Outcomes   Expected Outcomes  Demonstrated interest in learning. Expect positive outcomes    Future DMSE  PRN    Program Status  Completed       Individualized Plan for Diabetes Self-Management Training:   Learning Objective:  Patient will have a greater understanding of diabetes self-management. Patient education plan is to attend individual and/or group sessions per assessed needs and concerns.  Patient Instructions  When you call your insurance to check coverages the diabetes education billing code is [phone] and the outpatient hospital services claim UB-04  Benecol butter spread can help reduce cholesterol.  Aim to eat balanced meals.  Before you start your day helping other people, consider eating a meal earlier in the day and include vegetables to help get the vitamins and minerals you need. Can use the snack sheet for some ideas.  When exercising keep taps on your blood sugar to make sure it doesn't go too low. Keep something sweet with you just in case you need it. Continue  with your plan to do some swimming.    Expected Outcomes:  Demonstrated interest in learning. Expect positive outcomes  Education material provided: Meal plan card and Snack sheet  If problems or questions, patient to contact team via:  Phone and MyChart  Future DSME appointment: PRN

## 2018-12-03 ENCOUNTER — Other Ambulatory Visit: Payer: Self-pay | Admitting: Internal Medicine

## 2018-12-03 DIAGNOSIS — Z1231 Encounter for screening mammogram for malignant neoplasm of breast: Secondary | ICD-10-CM

## 2018-12-12 DIAGNOSIS — E1129 Type 2 diabetes mellitus with other diabetic kidney complication: Secondary | ICD-10-CM | POA: Diagnosis not present

## 2018-12-12 DIAGNOSIS — I10 Essential (primary) hypertension: Secondary | ICD-10-CM | POA: Diagnosis not present

## 2018-12-12 DIAGNOSIS — Z794 Long term (current) use of insulin: Secondary | ICD-10-CM | POA: Diagnosis not present

## 2018-12-12 DIAGNOSIS — Z4681 Encounter for fitting and adjustment of insulin pump: Secondary | ICD-10-CM | POA: Diagnosis not present

## 2018-12-26 ENCOUNTER — Ambulatory Visit: Payer: Medicare HMO | Admitting: Podiatry

## 2018-12-26 ENCOUNTER — Other Ambulatory Visit: Payer: Self-pay

## 2018-12-26 DIAGNOSIS — L989 Disorder of the skin and subcutaneous tissue, unspecified: Secondary | ICD-10-CM | POA: Diagnosis not present

## 2018-12-26 DIAGNOSIS — B351 Tinea unguium: Secondary | ICD-10-CM | POA: Diagnosis not present

## 2018-12-26 DIAGNOSIS — M79676 Pain in unspecified toe(s): Secondary | ICD-10-CM

## 2018-12-26 DIAGNOSIS — E0842 Diabetes mellitus due to underlying condition with diabetic polyneuropathy: Secondary | ICD-10-CM | POA: Diagnosis not present

## 2018-12-27 DIAGNOSIS — F430 Acute stress reaction: Secondary | ICD-10-CM | POA: Diagnosis not present

## 2018-12-27 DIAGNOSIS — F329 Major depressive disorder, single episode, unspecified: Secondary | ICD-10-CM | POA: Diagnosis not present

## 2018-12-31 NOTE — Progress Notes (Signed)
    Subjective: Patient is a 63 y.o. female presenting to the office today for follow up evaluation of painful callus lesions noted to the bilateral feet. Walking increases the pain. She has not had any treatment at home for her symptoms.  Patient also complains of elongated, thickened nails that cause pain while ambulating in shoes. She is unable to trim her own nails. Patient presents today for further treatment and evaluation.  Past Medical History:  Diagnosis Date  . Anxiety   . Arthritis    knees  . Coronary artery disease   . Depression   . Fatty liver   . History of nuclear stress test 12/16/2011   exercise myoview; normal images with 2-33mm ST-segment depression - subsequent cath revelaed subtotally occluded small 2nd marginal branch & 75% PDA lesion, normal LV function  . Hypertension   . Hypothyroidism   . Neuropathy    in feet  . OSA on CPAP    AHI = 44 (per patient) setting 13 per pt  . Personal history of kidney stones   . Primary localized osteoarthritis of left knee 04/24/2015  . Primary localized osteoarthritis of right knee 07/02/2015  . Tobacco abuse   . Type 2 diabetes mellitus (HCC)    insulin pump    Objective:  Physical Exam General: Alert and oriented x3 in no acute distress  Dermatology: Hyperkeratotic lesions present on the bilateral feet. Pain on palpation with a central nucleated core noted. Skin is warm, dry and supple bilateral lower extremities. Negative for open lesions or macerations. Nails are tender, long, thickened and dystrophic with subungual debris, consistent with onychomycosis, 1-5 bilateral. No signs of infection noted.  Vascular: Palpable pedal pulses bilaterally. No edema or erythema noted. Capillary refill within normal limits.  Neurological: Epicritic and protective threshold grossly intact bilaterally.   Musculoskeletal Exam: Pain on palpation at the keratotic lesion noted. Range of motion within normal limits bilateral. Muscle  strength 5/5 in all groups bilateral.  Assessment: 1. Onychodystrophic nails 1-5 bilateral with hyperkeratosis of nails.  2. Onychomycosis of nail due to dermatophyte bilateral 3. Pre-ulcerative callus lesions noted to the bilateral feet x 4   Plan of Care:  1. Patient evaluated. 2. Excisional debridement of keratoic lesion using a chisel blade was performed without incident.  3. Dressed with light dressing. 4. Mechanical debridement of nails 1-5 bilaterally performed using a nail nipper. Filed with dremel without incident.  5. Patient is to return to the clinic in 3 months.   Edrick Kins, DPM [facility]  Dr. Edrick Kins, [address]                                        [place], [place] [address]                Office [phone]  Fax [phone]

## 2019-01-14 DIAGNOSIS — E1129 Type 2 diabetes mellitus with other diabetic kidney complication: Secondary | ICD-10-CM | POA: Diagnosis not present

## 2019-01-21 ENCOUNTER — Ambulatory Visit
Admission: RE | Admit: 2019-01-21 | Discharge: 2019-01-21 | Disposition: A | Discharge status: Home / Self Care | Payer: Medicare HMO | Source: Ambulatory Visit | Attending: Internal Medicine | Admitting: Internal Medicine

## 2019-01-21 ENCOUNTER — Other Ambulatory Visit: Payer: Self-pay

## 2019-01-21 DIAGNOSIS — Z1231 Encounter for screening mammogram for malignant neoplasm of breast: Secondary | ICD-10-CM | POA: Diagnosis not present

## 2019-01-23 DIAGNOSIS — F329 Major depressive disorder, single episode, unspecified: Secondary | ICD-10-CM | POA: Diagnosis not present

## 2019-01-23 DIAGNOSIS — F430 Acute stress reaction: Secondary | ICD-10-CM | POA: Diagnosis not present

## 2019-02-11 DIAGNOSIS — G609 Hereditary and idiopathic neuropathy, unspecified: Secondary | ICD-10-CM | POA: Diagnosis not present

## 2019-02-11 DIAGNOSIS — E785 Hyperlipidemia, unspecified: Secondary | ICD-10-CM | POA: Diagnosis not present

## 2019-02-11 DIAGNOSIS — E039 Hypothyroidism, unspecified: Secondary | ICD-10-CM | POA: Diagnosis not present

## 2019-02-11 DIAGNOSIS — G4733 Obstructive sleep apnea (adult) (pediatric): Secondary | ICD-10-CM | POA: Diagnosis not present

## 2019-02-11 DIAGNOSIS — E1129 Type 2 diabetes mellitus with other diabetic kidney complication: Secondary | ICD-10-CM | POA: Diagnosis not present

## 2019-02-11 DIAGNOSIS — I10 Essential (primary) hypertension: Secondary | ICD-10-CM | POA: Diagnosis not present

## 2019-02-11 DIAGNOSIS — Z794 Long term (current) use of insulin: Secondary | ICD-10-CM | POA: Diagnosis not present

## 2019-02-13 DIAGNOSIS — E1129 Type 2 diabetes mellitus with other diabetic kidney complication: Secondary | ICD-10-CM | POA: Diagnosis not present

## 2019-02-25 DIAGNOSIS — F329 Major depressive disorder, single episode, unspecified: Secondary | ICD-10-CM | POA: Diagnosis not present

## 2019-02-25 DIAGNOSIS — F430 Acute stress reaction: Secondary | ICD-10-CM | POA: Diagnosis not present

## 2019-03-14 DIAGNOSIS — Z4681 Encounter for fitting and adjustment of insulin pump: Secondary | ICD-10-CM | POA: Diagnosis not present

## 2019-03-14 DIAGNOSIS — E1129 Type 2 diabetes mellitus with other diabetic kidney complication: Secondary | ICD-10-CM | POA: Diagnosis not present

## 2019-03-14 DIAGNOSIS — Z794 Long term (current) use of insulin: Secondary | ICD-10-CM | POA: Diagnosis not present

## 2019-03-14 DIAGNOSIS — I10 Essential (primary) hypertension: Secondary | ICD-10-CM | POA: Diagnosis not present

## 2019-03-27 ENCOUNTER — Other Ambulatory Visit: Payer: Self-pay

## 2019-03-27 ENCOUNTER — Ambulatory Visit (INDEPENDENT_AMBULATORY_CARE_PROVIDER_SITE_OTHER): Payer: Medicare Other | Admitting: Podiatry

## 2019-03-27 DIAGNOSIS — M79676 Pain in unspecified toe(s): Secondary | ICD-10-CM

## 2019-03-27 DIAGNOSIS — B351 Tinea unguium: Secondary | ICD-10-CM

## 2019-03-27 DIAGNOSIS — E0842 Diabetes mellitus due to underlying condition with diabetic polyneuropathy: Secondary | ICD-10-CM | POA: Diagnosis not present

## 2019-03-27 DIAGNOSIS — L989 Disorder of the skin and subcutaneous tissue, unspecified: Secondary | ICD-10-CM | POA: Diagnosis not present

## 2019-03-31 NOTE — Progress Notes (Signed)
    Subjective: Patient is a 64 y.o. female presenting to the office today for follow up evaluation of painful callus lesions noted to the bilateral feet. Walking increases the pain. She has not had any treatment at home for her symptoms.  Patient also complains of elongated, thickened nails that cause pain while ambulating in shoes. She is unable to trim her own nails. Patient presents today for further treatment and evaluation.  Past Medical History:  Diagnosis Date  . Anxiety   . Arthritis    knees  . Coronary artery disease   . Depression   . Fatty liver   . History of nuclear stress test 12/16/2011   exercise myoview; normal images with 2-87mm ST-segment depression - subsequent cath revelaed subtotally occluded small 2nd marginal branch & 75% PDA lesion, normal LV function  . Hypertension   . Hypothyroidism   . Neuropathy    in feet  . OSA on CPAP    AHI = 44 (per patient) setting 13 per pt  . Personal history of kidney stones   . Primary localized osteoarthritis of left knee 04/24/2015  . Primary localized osteoarthritis of right knee 07/02/2015  . Tobacco abuse   . Type 2 diabetes mellitus (HCC)    insulin pump    Objective:  Physical Exam General: Alert and oriented x3 in no acute distress  Dermatology: Hyperkeratotic lesions present on the bilateral feet. Pain on palpation with a central nucleated core noted. Skin is warm, dry and supple bilateral lower extremities. Negative for open lesions or macerations. Nails are tender, long, thickened and dystrophic with subungual debris, consistent with onychomycosis, 1-5 bilateral. No signs of infection noted.  Vascular: Palpable pedal pulses bilaterally. No edema or erythema noted. Capillary refill within normal limits.  Neurological: Epicritic and protective threshold grossly intact bilaterally.   Musculoskeletal Exam: Pain on palpation at the keratotic lesion noted. Range of motion within normal limits bilateral. Muscle  strength 5/5 in all groups bilateral.  Assessment: 1. Onychodystrophic nails 1-5 bilateral with hyperkeratosis of nails.  2. Onychomycosis of nail due to dermatophyte bilateral 3. Pre-ulcerative callus lesions noted to the bilateral feet x 4   Plan of Care:  1. Patient evaluated. 2. Excisional debridement of keratoic lesion using a chisel blade was performed without incident.  3. Dressed with light dressing. 4. Mechanical debridement of nails 1-5 bilaterally performed using a nail nipper. Filed with dremel without incident.  5. Patient is to return to the clinic in 3 months.   Edrick Kins, DPM [facility]  Dr. Edrick Kins, [address]                                        [place], [place] [address]                Office [phone]  Fax [phone]

## 2019-04-01 DIAGNOSIS — F430 Acute stress reaction: Secondary | ICD-10-CM | POA: Diagnosis not present

## 2019-04-01 DIAGNOSIS — F329 Major depressive disorder, single episode, unspecified: Secondary | ICD-10-CM | POA: Diagnosis not present

## 2019-04-23 DIAGNOSIS — E1169 Type 2 diabetes mellitus with other specified complication: Secondary | ICD-10-CM | POA: Diagnosis not present

## 2019-04-23 DIAGNOSIS — E1129 Type 2 diabetes mellitus with other diabetic kidney complication: Secondary | ICD-10-CM | POA: Diagnosis not present

## 2019-05-06 DIAGNOSIS — F329 Major depressive disorder, single episode, unspecified: Secondary | ICD-10-CM | POA: Diagnosis not present

## 2019-05-06 DIAGNOSIS — F430 Acute stress reaction: Secondary | ICD-10-CM | POA: Diagnosis not present

## 2019-05-07 DIAGNOSIS — E1129 Type 2 diabetes mellitus with other diabetic kidney complication: Secondary | ICD-10-CM | POA: Diagnosis not present

## 2019-05-13 DIAGNOSIS — Z23 Encounter for immunization: Secondary | ICD-10-CM | POA: Diagnosis not present

## 2019-05-21 DIAGNOSIS — Z794 Long term (current) use of insulin: Secondary | ICD-10-CM | POA: Diagnosis not present

## 2019-05-21 DIAGNOSIS — E1129 Type 2 diabetes mellitus with other diabetic kidney complication: Secondary | ICD-10-CM | POA: Diagnosis not present

## 2019-05-21 DIAGNOSIS — I10 Essential (primary) hypertension: Secondary | ICD-10-CM | POA: Diagnosis not present

## 2019-05-21 DIAGNOSIS — Z4681 Encounter for fitting and adjustment of insulin pump: Secondary | ICD-10-CM | POA: Diagnosis not present

## 2019-06-03 DIAGNOSIS — F329 Major depressive disorder, single episode, unspecified: Secondary | ICD-10-CM | POA: Diagnosis not present

## 2019-06-03 DIAGNOSIS — F430 Acute stress reaction: Secondary | ICD-10-CM | POA: Diagnosis not present

## 2019-06-09 DIAGNOSIS — Z23 Encounter for immunization: Secondary | ICD-10-CM | POA: Diagnosis not present

## 2019-06-24 DIAGNOSIS — Z794 Long term (current) use of insulin: Secondary | ICD-10-CM | POA: Diagnosis not present

## 2019-06-24 DIAGNOSIS — E1129 Type 2 diabetes mellitus with other diabetic kidney complication: Secondary | ICD-10-CM | POA: Diagnosis not present

## 2019-06-24 DIAGNOSIS — G4733 Obstructive sleep apnea (adult) (pediatric): Secondary | ICD-10-CM | POA: Diagnosis not present

## 2019-06-24 DIAGNOSIS — J984 Other disorders of lung: Secondary | ICD-10-CM | POA: Diagnosis not present

## 2019-06-24 DIAGNOSIS — E039 Hypothyroidism, unspecified: Secondary | ICD-10-CM | POA: Diagnosis not present

## 2019-06-24 DIAGNOSIS — F329 Major depressive disorder, single episode, unspecified: Secondary | ICD-10-CM | POA: Diagnosis not present

## 2019-06-24 DIAGNOSIS — F430 Acute stress reaction: Secondary | ICD-10-CM | POA: Diagnosis not present

## 2019-06-24 DIAGNOSIS — I10 Essential (primary) hypertension: Secondary | ICD-10-CM | POA: Diagnosis not present

## 2019-06-24 DIAGNOSIS — G609 Hereditary and idiopathic neuropathy, unspecified: Secondary | ICD-10-CM | POA: Diagnosis not present

## 2019-06-24 DIAGNOSIS — R82998 Other abnormal findings in urine: Secondary | ICD-10-CM | POA: Diagnosis not present

## 2019-06-24 DIAGNOSIS — E7849 Other hyperlipidemia: Secondary | ICD-10-CM | POA: Diagnosis not present

## 2019-06-24 DIAGNOSIS — R5383 Other fatigue: Secondary | ICD-10-CM | POA: Diagnosis not present

## 2019-06-24 DIAGNOSIS — Z720 Tobacco use: Secondary | ICD-10-CM | POA: Diagnosis not present

## 2019-06-26 ENCOUNTER — Ambulatory Visit: Payer: Medicare Other | Admitting: Podiatry

## 2019-07-08 DIAGNOSIS — F430 Acute stress reaction: Secondary | ICD-10-CM | POA: Diagnosis not present

## 2019-07-08 DIAGNOSIS — F329 Major depressive disorder, single episode, unspecified: Secondary | ICD-10-CM | POA: Diagnosis not present

## 2019-07-15 ENCOUNTER — Ambulatory Visit (INDEPENDENT_AMBULATORY_CARE_PROVIDER_SITE_OTHER): Payer: Medicare Other | Admitting: Podiatry

## 2019-07-15 ENCOUNTER — Encounter: Payer: Self-pay | Admitting: Podiatry

## 2019-07-15 ENCOUNTER — Other Ambulatory Visit: Payer: Self-pay

## 2019-07-15 DIAGNOSIS — M79676 Pain in unspecified toe(s): Secondary | ICD-10-CM | POA: Diagnosis not present

## 2019-07-15 DIAGNOSIS — L989 Disorder of the skin and subcutaneous tissue, unspecified: Secondary | ICD-10-CM

## 2019-07-15 DIAGNOSIS — E0842 Diabetes mellitus due to underlying condition with diabetic polyneuropathy: Secondary | ICD-10-CM

## 2019-07-15 DIAGNOSIS — B351 Tinea unguium: Secondary | ICD-10-CM | POA: Diagnosis not present

## 2019-07-17 NOTE — Progress Notes (Signed)
    Subjective: Patient is a 64 y.o. female with PMHx of diabetes mellitus presenting to the office today for follow up evaluation of painful callus lesions noted to the bilateral feet. Walking increases the pain. She has not had any treatment at home for her symptoms.  Patient also complains of elongated, thickened nails that cause pain while ambulating in shoes. She is unable to trim her own nails. Patient presents today for further treatment and evaluation.  Past Medical History:  Diagnosis Date  . Anxiety   . Arthritis    knees  . Coronary artery disease   . Depression   . Fatty liver   . History of nuclear stress test 12/16/2011   exercise myoview; normal images with 2-58mm ST-segment depression - subsequent cath revelaed subtotally occluded small 2nd marginal branch & 75% PDA lesion, normal LV function  . Hypertension   . Hypothyroidism   . Neuropathy    in feet  . OSA on CPAP    AHI = 44 (per patient) setting 13 per pt  . Personal history of kidney stones   . Primary localized osteoarthritis of left knee 04/24/2015  . Primary localized osteoarthritis of right knee 07/02/2015  . Tobacco abuse   . Type 2 diabetes mellitus (HCC)    insulin pump    Objective:  Physical Exam General: Alert and oriented x3 in no acute distress  Dermatology: Hyperkeratotic lesions present on the bilateral feet. Pain on palpation with a central nucleated core noted. Skin is warm, dry and supple bilateral lower extremities. Negative for open lesions or macerations. Nails are tender, long, thickened and dystrophic with subungual debris, consistent with onychomycosis, 1-5 bilateral. No signs of infection noted.  Vascular: Palpable pedal pulses bilaterally. No edema or erythema noted. Capillary refill within normal limits.  Neurological: Epicritic and protective threshold diminished bilaterally.   Musculoskeletal Exam: Pain on palpation at the keratotic lesion noted. Range of motion within normal  limits bilateral. Muscle strength 5/5 in all groups bilateral.  Assessment: 1. Onychodystrophic nails 1-5 bilateral with hyperkeratosis of nails.  2. Onychomycosis of nail due to dermatophyte bilateral 3. Pre-ulcerative callus lesions noted to the bilateral feet x 4   Plan of Care:  1. Patient evaluated. 2. Excisional debridement of keratoic lesion using a chisel blade was performed without incident.  3. Dressed with light dressing. 4. Mechanical debridement of nails 1-5 bilaterally performed using a nail nipper. Filed with dremel without incident.  5. Patient is to return to the clinic in 3 months.   Edrick Kins, DPM [facility]  Dr. Edrick Kins, [address]                                        [place], [place] [address]                Office [phone]  Fax [phone]

## 2019-07-23 ENCOUNTER — Encounter: Payer: Self-pay | Admitting: Sports Medicine

## 2019-07-23 ENCOUNTER — Other Ambulatory Visit: Payer: Self-pay | Admitting: Sports Medicine

## 2019-07-23 ENCOUNTER — Ambulatory Visit (INDEPENDENT_AMBULATORY_CARE_PROVIDER_SITE_OTHER): Payer: Medicare Other | Admitting: Sports Medicine

## 2019-07-23 ENCOUNTER — Telehealth: Payer: Self-pay | Admitting: Sports Medicine

## 2019-07-23 ENCOUNTER — Ambulatory Visit (INDEPENDENT_AMBULATORY_CARE_PROVIDER_SITE_OTHER): Payer: Medicare Other

## 2019-07-23 ENCOUNTER — Other Ambulatory Visit: Payer: Self-pay

## 2019-07-23 VITALS — Temp 97.1°F

## 2019-07-23 DIAGNOSIS — E0842 Diabetes mellitus due to underlying condition with diabetic polyneuropathy: Secondary | ICD-10-CM

## 2019-07-23 DIAGNOSIS — L97511 Non-pressure chronic ulcer of other part of right foot limited to breakdown of skin: Secondary | ICD-10-CM

## 2019-07-23 DIAGNOSIS — L97512 Non-pressure 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: Secondary | ICD-10-CM | POA: Diagnosis not present

## 2019-07-23 DIAGNOSIS — M79674 Pain in right toe(s): Secondary | ICD-10-CM

## 2019-07-23 MED ORDER — CLINDAMYCIN HCL 300 MG PO CAPS
300.0000 mg | ORAL_CAPSULE | Freq: Three times a day (TID) | ORAL | 0 refills | Status: DC
Start: 2019-07-23 — End: 2019-08-01

## 2019-07-23 NOTE — Telephone Encounter (Signed)
Patient reports that she has noticed swelling and bleeding from toe. Patient is diabetic and already has amputations and is concerned. Patient typically sees [name] and requests to be seen. I advised patient to present to office this morning to be added to schedule to be seen, made her aware that [name] is not in office today but myself or any other provider can see her. -Dr. Cannon Kettle

## 2019-07-23 NOTE — Progress Notes (Signed)
Subjective: [name] is a 64 y.o. female patient seen in office for evaluation of ulceration of the second toe stump site noticed this morning with bleeding and now some odor. Patient has a history of diabetes and a blood glucose level today of 84 mg/dl. A1c 7.2 or 7.4. Denies nausea/fever/vomiting/chills/night sweats/shortness of breath/pain. Patient has no other pedal complaints at this time.  Patient Active Problem List   Diagnosis Date Noted   Diarrhea 11/12/2016   AKI (acute kidney injury) ([place]) 11/12/2016   Sepsis ([place]) 10/06/2016   Diabetes mellitus due to underlying condition, uncontrolled ([place]) 10/06/2016   Primary localized osteoarthritis of right knee 07/02/2015   S/P right unicompartmental knee replacement 07/02/2015   Infection of prosthetic left knee joint ([place]) 05/13/2015   Primary localized osteoarthritis of left knee 04/24/2015   S/P left unicompartmental knee replacement 04/24/2015   Hyperlipidemia 04/03/2015   Pelvic mass in female 02/17/2015   Mass, ovarian 02/09/2015   CAD (coronary artery disease) 11/05/2012   Obesity 11/05/2012   Tobacco abuse 11/05/2012   OSA on CPAP 11/05/2012   HTN (hypertension) 11/05/2012   DM (diabetes mellitus) type 2, uncontrolled, with ketoacidosis ([place]) 11/05/2012   Current Outpatient Medications on File Prior to Visit  Medication Sig Dispense Refill   ACCU-CHEK AVIVA PLUS test strip      ALPRAZolam (XANAX) 0.5 MG tablet Take 0.5 mg by mouth at bedtime as needed for anxiety.      alum & mag hydroxide-simeth (MAALOX/MYLANTA) 200-200-20 MG/5ML suspension Take 15 mLs by mouth every 4 (four) hours as needed for indigestion or heartburn. 355 mL 0   atorvastatin (LIPITOR) 20 MG tablet Take 20 mg by mouth daily.     buPROPion (WELLBUTRIN SR) 100 MG 12 hr tablet Take 100 mg by mouth every 12 (twelve) hours.  2   Cholecalciferol (VITAMIN D PO) Take by mouth.     escitalopram (LEXAPRO) 20 MG tablet Take 20 mg by  mouth daily.     gabapentin (NEURONTIN) 300 MG capsule Take 600 mg by mouth 3 (three) times daily.      gentamicin cream (GARAMYCIN) 0.1 % Apply 1 application topically 3 (three) times daily. 30 g 1   Insulin Human (INSULIN PUMP) SOLN Inject 1 each into the skin continuous. Humalog - basal rate 3.2 Units/hr (Patient taking differently: Inject 1 each into the skin continuous. Novolog - basal rate 3.2 units/hr)     levothyroxine (SYNTHROID, LEVOTHROID) 125 MCG tablet Take 125 mcg by mouth daily.  2   levothyroxine (SYNTHROID, LEVOTHROID) 137 MCG tablet      losartan-hydrochlorothiazide (HYZAAR) 100-12.5 MG tablet Take 1 tablet by mouth daily.  3   NOVOLOG 100 UNIT/ML injection UTD INJ UP TO A MAX OF 122 UNITS [place] PER DAY VIA INSULIN PUMP  2   ondansetron (ZOFRAN) 4 MG tablet Take 1 tablet (4 mg total) by mouth every 6 (six) hours as needed for nausea. 20 tablet 0   OVER THE COUNTER MEDICATION Pt uses CPAP machine daily.     silver sulfADIAZINE (SILVADENE) 1 % cream Apply 1 application topically daily. 50 g 0   No current facility-administered medications on file prior to visit.   Allergies  Allergen Reactions   Penicillins Hives    Has patient had a PCN reaction causing immediate rash, facial/tongue/throat swelling, SOB or lightheadedness with hypotension: Unknown Has patient had a PCN reaction causing severe rash involving mucus membranes or skin necrosis: Unknown Has patient had a PCN reaction that required hospitalization: Unknown  Has patient had a PCN reaction occurring within the last 10 years: No If all of the above answers are "NO", then may proceed with Cephalosporin use.    No results found for this or any previous visit (from the past 2160 hour(s)).  Objective: There were no vitals filed for this visit.  General: Patient is awake, alert, oriented x 3 and in no acute distress.  Dermatology: Skin is warm and dry bilateral with a full thickness ulceration present medial  second toe stump site on the right. Ulceration measures 0.8 cm x 0.6cm x 0.3 cm. There is a macerated border with a fibrogranular base. The ulceration probes to joint capsule. There is  malodor, clear active drainage, focal erythema, focal edema. No other acute signs of infection.   Vascular: Dorsalis Pedis pulse = 1/4 Bilateral,  Posterior Tibial pulse = 1/4 Bilateral,  Capillary Fill Time < 5 seconds in all remaining toes  Neurologic: Protective sensation absent bilateral.  Musculosketal: There is no pain with palpation to ulcerated area.  Status post partial right second and third toe amputation.  Xrays,Right foot: At medial second toe stump site there is no bony destruction suggestive of osteomyelitis. No gas in soft tissues but there is mild soft tissue swelling.  History of partial second toe and third toe amputation.  No results for input(s): GRAMSTAIN, LABORGA in the last 8760 hours.  Assessment and Plan:  Problem List Items Addressed This Visit    None    Visit Diagnoses    Skin ulcer of second toe of right foot, with fat layer exposed ([place])    -  Primary   Relevant Orders   WOUND CULTURE   Pain of toe of right foot       Diabetes mellitus due to underlying condition with diabetic polyneuropathy, unspecified whether long term insulin use ([place])          -Examined patient and discussed the progression of the wound and treatment alternatives. -Xrays reviewed - Excisionally dedbrided ulceration at right second toe to healthy bleeding borders removing nonviable tissue using a sterile chisel blade. Wound measures post debridement as above.  Wound was debrided to the level of the dermis with viable wound base exposed to promote healing. Hemostasis was achieved with manuel pressure. Patient tolerated procedure well without any discomfort or anesthesia necessary for this wound debridement.  -Rx Clindamycin and advised patient to monitor area of redness if progresses beyond markings to  call office or go to hospital -Applied Iodosorb and dry sterile dressing and instructed patient to continue with daily dressings at home consisting of the same - Advised patient to go to the ER or return to office if the wound worsens or if constitutional symptoms are present. -Patient to return to office in few days with [name] for follow up care and evaluation or sooner if problems arise.  Landis Martins, DPM

## 2019-07-25 ENCOUNTER — Encounter: Payer: Self-pay | Admitting: Sports Medicine

## 2019-07-25 ENCOUNTER — Encounter (HOSPITAL_BASED_OUTPATIENT_CLINIC_OR_DEPARTMENT_OTHER): Payer: Self-pay

## 2019-07-25 ENCOUNTER — Other Ambulatory Visit: Payer: Self-pay

## 2019-07-25 DIAGNOSIS — E039 Hypothyroidism, unspecified: Secondary | ICD-10-CM | POA: Insufficient documentation

## 2019-07-25 DIAGNOSIS — L03115 Cellulitis of right lower limb: Secondary | ICD-10-CM | POA: Insufficient documentation

## 2019-07-25 DIAGNOSIS — Z89421 Acquired absence of other right toe(s): Secondary | ICD-10-CM | POA: Diagnosis not present

## 2019-07-25 DIAGNOSIS — Z87891 Personal history of nicotine dependence: Secondary | ICD-10-CM | POA: Diagnosis not present

## 2019-07-25 DIAGNOSIS — Z794 Long term (current) use of insulin: Secondary | ICD-10-CM | POA: Diagnosis not present

## 2019-07-25 DIAGNOSIS — I10 Essential (primary) hypertension: Secondary | ICD-10-CM | POA: Diagnosis not present

## 2019-07-25 DIAGNOSIS — E1140 Type 2 diabetes mellitus with diabetic neuropathy, unspecified: Secondary | ICD-10-CM | POA: Insufficient documentation

## 2019-07-25 DIAGNOSIS — I2510 Atherosclerotic heart disease of native coronary artery without angina pectoris: Secondary | ICD-10-CM | POA: Insufficient documentation

## 2019-07-25 NOTE — Progress Notes (Signed)
Patient call Answering Service stating that the infection on her foot seem to be worsening now has swelling and pain to the level of the ankle. Patient was seen by me an office on Tuesday and started on antibiotics with aggressive wound care and patient reports that her husband has been changing the dressing but it still seems to be getting worse the redness and swelling is going down on the toe but more swelling now at the ankle. Advis e patient that with her pain as well and worsening and her risk factors it is best that she go to the ER for further evaluation since her outpatient antibiotics have not improved her right foot. Patient expressed understanding and states that she will try to go to [facility], [facility] or [facility] med center for worsening cellulitis with concern for acute osteomyelitis . Advised patient if she has any issues at the ER to call office or have th e physician to call me directly. Dr. Cannon Kettle

## 2019-07-25 NOTE — ED Triage Notes (Signed)
Pt states she is being treated for infection to right 2nd toe-started abx 2 days-states redness/swelling to right ankle area x today-was advised by podiatrist  to come to ED-NAD-steady gait

## 2019-07-26 ENCOUNTER — Emergency Department (HOSPITAL_BASED_OUTPATIENT_CLINIC_OR_DEPARTMENT_OTHER): Payer: Medicare Other

## 2019-07-26 ENCOUNTER — Emergency Department (HOSPITAL_BASED_OUTPATIENT_CLINIC_OR_DEPARTMENT_OTHER)
Admission: EM | Admit: 2019-07-26 | Discharge: 2019-07-26 | Disposition: A | Discharge status: Home / Self Care | Payer: Medicare Other | Attending: Emergency Medicine | Admitting: Emergency Medicine

## 2019-07-26 DIAGNOSIS — L03115 Cellulitis of right lower limb: Secondary | ICD-10-CM

## 2019-07-26 DIAGNOSIS — Z89421 Acquired absence of other right toe(s): Secondary | ICD-10-CM | POA: Diagnosis not present

## 2019-07-26 LAB — CBC WITH DIFFERENTIAL/PLATELET
Abs Immature Granulocytes: 0.09 10*3/uL — ABNORMAL HIGH (ref 0.00–0.07)
Basophils Absolute: 0.1 10*3/uL (ref 0.0–0.1)
Basophils Relative: 1 %
Eosinophils Absolute: 0.3 10*3/uL (ref 0.0–0.5)
Eosinophils Relative: 3 %
HCT: 38.9 % (ref 36.0–46.0)
Hemoglobin: 12.6 g/dL (ref 12.0–15.0)
Immature Granulocytes: 1 %
Lymphocytes Relative: 30 %
Lymphs Abs: 3.6 10*3/uL (ref 0.7–4.0)
MCH: 31.7 pg (ref 26.0–34.0)
MCHC: 32.4 g/dL (ref 30.0–36.0)
MCV: 97.7 fL (ref 80.0–100.0)
Monocytes Absolute: 0.6 10*3/uL (ref 0.1–1.0)
Monocytes Relative: 5 %
Neutro Abs: 7.4 10*3/uL (ref 1.7–7.7)
Neutrophils Relative %: 60 %
Platelets: 225 10*3/uL (ref 150–400)
RBC: 3.98 MIL/uL (ref 3.87–5.11)
RDW: 13.5 % (ref 11.5–15.5)
WBC: 12.1 10*3/uL — ABNORMAL HIGH (ref 4.0–10.5)
nRBC: 0 % (ref 0.0–0.2)

## 2019-07-26 LAB — BASIC METABOLIC PANEL
Anion gap: 9 (ref 5–15)
BUN: 19 mg/dL (ref 8–23)
CO2: 26 mmol/L (ref 22–32)
Calcium: 9.4 mg/dL (ref 8.9–10.3)
Chloride: 101 mmol/L (ref 98–111)
Creatinine, Ser: 1.1 mg/dL — ABNORMAL HIGH (ref 0.44–1.00)
GFR calc Af Amer: >60 mL/min (ref >60)
GFR calc non Af Amer: 53 mL/min — ABNORMAL LOW (ref >60)
Glucose, Bld: 90 mg/dL (ref 70–99)
Potassium: 3.8 mmol/L (ref 3.5–5.1)
Sodium: 136 mmol/L (ref 135–145)

## 2019-07-26 LAB — CBG MONITORING, ED: Glucose-Capillary: 87 mg/dL (ref 70–99)

## 2019-07-26 MED ORDER — CEPHALEXIN 500 MG PO CAPS
500.0000 mg | ORAL_CAPSULE | Freq: Four times a day (QID) | ORAL | 0 refills | Status: DC
Start: 2019-07-26 — End: 2021-09-01

## 2019-07-26 MED ORDER — PROBIOTIC PO CAPS: 1.0000 | ORAL_CAPSULE | Freq: Every day | ORAL | 1 refills | Status: DC

## 2019-07-26 MED ORDER — VANCOMYCIN HCL IN DEXTROSE 1-5 GM/200ML-% IV SOLN
1000.0000 mg | Freq: Once | INTRAVENOUS | Status: AC
  Administered 2019-07-26: 1000 mg via INTRAVENOUS
  Filled 2019-07-26: qty 200

## 2019-07-26 MED ORDER — SODIUM CHLORIDE 0.9 % IV SOLN
INTRAVENOUS | Status: DC | PRN
  Administered 2019-07-26: 500 mL via INTRAVENOUS

## 2019-07-26 MED ORDER — SODIUM CHLORIDE 0.9 % IV SOLN
1.0000 g | Freq: Once | INTRAVENOUS | Status: AC
  Administered 2019-07-26: 1 g via INTRAVENOUS
  Filled 2019-07-26: qty 10

## 2019-07-26 NOTE — ED Notes (Signed)
Checked CBG 87

## 2019-07-26 NOTE — ED Notes (Signed)
Bacitracin ointment applied to wound on right foot  Nonadherent dressing applied and wrapped with [place]  Pt tolerated well

## 2019-07-26 NOTE — Discharge Instructions (Signed)
Please continue your clindamycin as prescribed and follow-up with your podiatrist.  Your labs and x-ray today were reassuring.  At this time given improvement of your symptoms, you do not need admission to the hospital.  You may alternate Tylenol 1000 mg every 6 hours as needed for pain, fever and Ibuprofen 800 mg every 8 hours as needed for pain, fever.  Please take Ibuprofen with food.  Do not take more than 4000 mg of Tylenol (acetaminophen) in a 24 hour period.

## 2019-07-26 NOTE — ED Provider Notes (Signed)
TIME SEEN: 2:57 AM  CHIEF COMPLAINT: Right lower extremity cellulitis  HPI: Patient is a 64 year old female with history of insulin-dependent diabetes, hypertension, hypothyroidism who presents to the emergency department with concerns for cellulitis of the right lower extremity.  States a few days ago she noticed an ulcer to the stump of the second toe.  She has had to have previous amputation due to infection before and is followed by [name] with podiatry.  Was seen by Dr. Cannon Kettle on 07/23/2019 and started on clindamycin.  States she has taken a couple of days worth.  She states that she feels the redness and swelling of the second toe has markedly improved but tonight she noticed that she had some distal ankle swelling and a mild amount of redness and warmth.  She does report that she has been on her feet a lot over the past 1 to 2 days.  No fevers, chills, nausea, vomiting.  ROS: See HPI Constitutional: no fever  Eyes: no drainage  ENT: no runny nose   Cardiovascular:  no chest pain  Resp: no SOB  GI: no vomiting GU: no dysuria Integumentary: no rash  Allergy: no hives  Musculoskeletal: no leg swelling  Neurological: no slurred speech ROS otherwise negative  PAST MEDICAL HISTORY/PAST SURGICAL HISTORY:  Past Medical History:  Diagnosis Date   Anxiety    Arthritis    knees   Coronary artery disease    Depression    Fatty liver    History of nuclear stress test 12/16/2011   exercise myoview; normal images with 2-48mm ST-segment depression - subsequent cath revelaed subtotally occluded small 2nd marginal branch & 75% PDA lesion, normal LV function   Hypertension    Hypothyroidism    Neuropathy    in feet   OSA on CPAP    AHI = 44 (per patient) setting 13 per pt   Personal history of kidney stones    Primary localized osteoarthritis of left knee 04/24/2015   Primary localized osteoarthritis of right knee 07/02/2015   Tobacco abuse    Type 2 diabetes mellitus (HCC)      insulin pump    MEDICATIONS:  Prior to Admission medications   Medication Sig Start Date End Date Taking? Authorizing Provider  ACCU-CHEK AVIVA PLUS test strip  12/19/14   [provider]  ALPRAZolam Duanne Moron) 0.5 MG tablet Take 0.5 mg by mouth at bedtime as needed for anxiety.  05/07/15   [provider]  alum & mag hydroxide-simeth (MAALOX/MYLANTA) 200-200-20 MG/5ML suspension Take 15 mLs by mouth every 4 (four) hours as needed for indigestion or heartburn. 10/08/16   Aline August, MD  atorvastatin (LIPITOR) 20 MG tablet Take 20 mg by mouth daily. 10/12/14   [provider]  buPROPion (WELLBUTRIN SR) 100 MG 12 hr tablet Take 100 mg by mouth every 12 (twelve) hours. 10/06/16   [provider]  Cholecalciferol (VITAMIN D PO) Take by mouth.    [provider]  clindamycin (CLEOCIN) 300 MG capsule Take 1 capsule (300 mg total) by mouth 3 (three) times daily. 07/23/19   Landis Martins, DPM  escitalopram (LEXAPRO) 20 MG tablet Take 20 mg by mouth daily.    [provider]  gabapentin (NEURONTIN) 300 MG capsule Take 600 mg by mouth 3 (three) times daily.  10/12/14   [provider]  gentamicin cream (GARAMYCIN) 0.1 % Apply 1 application topically 3 (three) times daily. 12/05/16   Edrick Kins, DPM  Insulin Human (INSULIN PUMP)  SOLN Inject 1 each into the skin continuous. Humalog - basal rate 3.2 Units/hr Patient taking differently: Inject 1 each into the skin continuous. Novolog - basal rate 3.2 units/hr 10/08/16   Aline August, MD  levothyroxine (SYNTHROID, LEVOTHROID) 125 MCG tablet Take 125 mcg by mouth daily. 10/06/16   [provider]  levothyroxine (SYNTHROID, LEVOTHROID) 137 MCG tablet  09/13/17   [provider]  losartan-hydrochlorothiazide (HYZAAR) 100-12.5 MG tablet Take 1 tablet by mouth daily. 10/27/16   [provider]  NOVOLOG 100 UNIT/ML injection UTD INJ UP TO A MAX OF 122 UNITS [place] PER DAY VIA  INSULIN PUMP 10/19/16   [provider]  ondansetron (ZOFRAN) 4 MG tablet Take 1 tablet (4 mg total) by mouth every 6 (six) hours as needed for nausea. 10/08/16   Aline August, MD  OVER THE COUNTER MEDICATION Pt uses CPAP machine daily.    [provider]  silver sulfADIAZINE (SILVADENE) 1 % cream Apply 1 application topically daily. 10/11/16   Gean Birchwood, DPM    ALLERGIES:  Allergies  Allergen Reactions   Penicillins Hives    Has patient had a PCN reaction causing immediate rash, facial/tongue/throat swelling, SOB or lightheadedness with hypotension: Unknown Has patient had a PCN reaction causing severe rash involving mucus membranes or skin necrosis: Unknown Has patient had a PCN reaction that required hospitalization: Unknown Has patient had a PCN reaction occurring within the last 10 years: No If all of the above answers are "NO", then may proceed with Cephalosporin use.    SOCIAL HISTORY:  Social History   Tobacco Use   Smoking status: Former Smoker    Packs/day: 1.00    Years: 25.00    Pack years: 25.00    Types: Cigarettes   Smokeless tobacco: Never Used   Tobacco comment: quit April 2021  Substance Use Topics   Alcohol use: No    Alcohol/week: 0.0 standard drinks    FAMILY HISTORY: Family History  Problem Relation Age of Onset   Stroke Mother    Hypertension Mother    Diabetes Mother    Alzheimer's disease Mother    Diabetes Father    COPD Father        vent-dependent, MODS   Hypertension Sister    Mental illness Sister        borderline personality d/o   Mental illness Sister        schizoeffective d/o   Diabetes Sister     EXAM: BP (!) 146/74 (BP Location: Right Arm)    Pulse 61    Temp 98.4 F (36.9 C) (Oral)    Resp 14    Ht 5\' 7"  (1.702 m)    Wt 118.4 kg    SpO2 95%    BMI 40.88 kg/m  CONSTITUTIONAL: Alert and oriented and responds appropriately to questions. Well-appearing; well-nourished, nontoxic,  pleasant HEAD: Normocephalic EYES: Conjunctivae clear, pupils appear equal, EOM appear intact ENT: normal nose; moist mucous membranes NECK: Supple, normal ROM CARD: RRR; S1 and S2 appreciated; no murmurs, no clicks, no rubs, no gallops RESP: Normal chest excursion without splinting or tachypnea; breath sounds clear and equal bilaterally; no wheezes, no rhonchi, no rales, no hypoxia or respiratory distress, speaking full sentences ABD/GI: Normal bowel sounds; non-distended; soft, non-tender, no rebound, no guarding, no peritoneal signs, no hepatosplenomegaly BACK:  The back appears normal EXT: Normal ROM in all joints; no deformity noted, no edema; no cyanosis, approximately 1 x 1 cm ulcerated lesion to the second  toe of the right foot over the medial aspect without purulent drainage, foul odor or bleeding.  There appears to be fat exposure but no muscle or bone appreciated.  There is a small amount of redness to this toe but this does not streak into the foot.  She does have a small amount of redness just proximal to the right ankle with a very minimal soft tissue swelling.  There is no joint effusion.  She has a 2+ DP pulse.  Compartments are soft. SKIN: Normal color for age and race; warm; no rash on exposed skin NEURO: Moves all extremities equally PSYCH: The patient's mood and manner are appropriate.   MEDICAL DECISION MAKING: Patient here with right lower extremity cellulitis.  She states she feels that the toe is getting better but was concerned tonight when she saw some redness and swelling around the ankle.  No systemic symptoms.  Reports her blood glucose has been well controlled.  Will check labs, x-ray and give vancomycin, Rocephin for broad coverage.  It appears she is on clindamycin currently.  She states a wound culture was sent at her podiatrist office and is currently in process.  It appears that her wound culture is growing gram-positive cocci in pairs (strep or staph) and also  gram-positive bacilli (likely normal flora).  It appears her previous wound culture in 2018 from a wound grew MSSA that was resistant to clindamycin.  Have requested she continue her clindamycin as she is high risk for MRSA we will also add on Keflex as well.  Her labs show minimal leukocytosis but she is afebrile without systemic symptoms and her blood glucose is well controlled.  X-ray obtained here shows no sign of osseous involvement.  Low suspicion that she has acute osteomyelitis today.  I do not feel she needs admission at this time and she is comfortable with this plan.  Recommended close follow-up with [name].  At this time, I do not feel there is any life-threatening condition present. I have reviewed, interpreted and discussed all results (EKG, imaging, lab, urine as appropriate) and exam findings with patient/family. I have reviewed nursing notes and appropriate previous records.  I feel the patient is safe to be discharged home without further emergent workup and can continue workup as an outpatient as needed. Discussed usual and customary return precautions. Patient/family verbalize understanding and are comfortable with this plan.  Outpatient follow-up has been provided as needed. All questions have been answered.        Patient gave verbal permission to utilize photo for medical documentation only. The image was not stored on any personal device.    [name] was evaluated in Emergency Department on 07/26/2019 for the symptoms described in the history of present illness. She was evaluated in the context of the global COVID-19 pandemic, which necessitated consideration that the patient might be at risk for infection with the SARS-CoV-2 virus that causes COVID-19. Institutional protocols and algorithms that pertain to the evaluation of patients at risk for COVID-19 are in a state of rapid change based on information released by regulatory bodies including the CDC and federal and  state organizations. These policies and algorithms were followed during the patient's care in the ED.      Adonna Horsley, Delice Bison, DO 07/26/19 [phone]

## 2019-07-29 ENCOUNTER — Encounter: Payer: Self-pay | Admitting: Podiatry

## 2019-07-29 ENCOUNTER — Other Ambulatory Visit: Payer: Self-pay

## 2019-07-29 ENCOUNTER — Ambulatory Visit (INDEPENDENT_AMBULATORY_CARE_PROVIDER_SITE_OTHER): Payer: Medicare Other | Admitting: Podiatry

## 2019-07-29 DIAGNOSIS — L03031 Cellulitis of right toe: Secondary | ICD-10-CM

## 2019-07-29 DIAGNOSIS — E0843 Diabetes mellitus due to underlying condition with diabetic autonomic (poly)neuropathy: Secondary | ICD-10-CM

## 2019-07-29 DIAGNOSIS — L97512 Non-pressure 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: Secondary | ICD-10-CM

## 2019-07-29 NOTE — Patient Instructions (Signed)
Pre-Operative Instructions  Congratulations, you have decided to take an important step towards improving your quality of life.  You can be assured that the doctors and staff at [facility] will be with you every step of the way.  Here are some important things you should know:  1. Plan to be at the surgery center/hospital at least 1 (one) hour prior to your scheduled time, unless otherwise directed by the surgical center/hospital staff.  You must have a responsible adult accompany you, remain during the surgery and drive you home.  Make sure you have directions to the surgical center/hospital to ensure you arrive on time. 2. If you are having surgery at [facility] or [facility] hospitals, you will need a copy of your medical history and physical form from your family physician within one month prior to the date of surgery. We will give you a form for your primary physician to complete.  3. We make every effort to accommodate the date you request for surgery.  However, there are times where surgery dates or times have to be moved.  We will contact you as soon as possible if a change in schedule is required.   4. No aspirin/ibuprofen for one week before surgery.  If you are on aspirin, any non-steroidal anti-inflammatory medications (Mobic, Aleve, Ibuprofen) should not be taken seven (7) days prior to your surgery.  You make take Tylenol for pain prior to surgery.  5. Medications - If you are taking daily heart and blood pressure medications, seizure, reflux, allergy, asthma, anxiety, pain or diabetes medications, make sure you notify the surgery center/hospital before the day of surgery so they can tell you which medications you should take or avoid the day of surgery. 6. No food or drink after midnight the night before surgery unless directed otherwise by surgical center/hospital staff. 7. No alcoholic beverages 24-hours prior to surgery.  No smoking 24-hours prior or 24-hours after  surgery. 8. Wear loose pants or shorts. They should be loose enough to fit over bandages, boots, and casts. 9. Don't wear slip-on shoes. Sneakers are preferred. 10. Bring your boot with you to the surgery center/hospital.  Also bring crutches or a walker if your physician has prescribed it for you.  If you do not have this equipment, it will be provided for you after surgery. 11. If you have not been contacted by the surgery center/hospital by the day before your surgery, call to confirm the date and time of your surgery. 12. Leave-time from work may vary depending on the type of surgery you have.  Appropriate arrangements should be made prior to surgery with your employer. 13. Prescriptions will be provided immediately following surgery by your doctor.  Fill these as soon as possible after surgery and take the medication as directed. Pain medications will not be refilled on weekends and must be approved by the doctor. 14. Remove nail polish on the operative foot and avoid getting pedicures prior to surgery. 15. Wash the night before surgery.  The night before surgery wash the foot and leg well with water and the antibacterial soap provided. Be sure to pay special attention to beneath the toenails and in between the toes.  Wash for at least three (3) minutes. Rinse thoroughly with water and dry well with a towel.  Perform this wash unless told not to do so by your physician.  Enclosed: 1 Ice pack (please put in freezer the night before surgery)   1 Hibiclens skin cleaner     Pre-op instructions  If you have any questions regarding the instructions, please do not hesitate to call our office.  [place]: [address], [place], [place] [address] -- [phone]  [place]: [address] [place], [place] [address] -- [phone]  [place]: [address], [place], [place] [address] -- [phone]   Website: [email or web address] 

## 2019-07-30 LAB — WOUND CULTURE
MICRO NUMBER:: 10538731
SPECIMEN QUALITY:: ADEQUATE

## 2019-07-30 NOTE — Progress Notes (Signed)
HPI: 64 y.o. female past medical history diabetes mellitus presenting today for evaluation of a new ulceration that developed to the medial aspect of the second toe right foot.  Patient states that it developed over the past week.  She has been experiencing draining with redness and the toe is very sore.  She denies fever nausea chills vomiting or shortness of breath.  She went to the emergency department was provided oral antibiotics from the ED.  The patient does not recall any change in shoe gear or anything that would have created the wound.  She presents for further treatment evaluation  Past Medical History:  Diagnosis Date  . Anxiety   . Arthritis    knees  . Coronary artery disease   . Depression   . Fatty liver   . History of nuclear stress test 12/16/2011   exercise myoview; normal images with 2-26mm ST-segment depression - subsequent cath revelaed subtotally occluded small 2nd marginal branch & 75% PDA lesion, normal LV function  . Hypertension   . Hypothyroidism   . Neuropathy    in feet  . OSA on CPAP    AHI = 44 (per patient) setting 13 per pt  . Personal history of kidney stones   . Primary localized osteoarthritis of left knee 04/24/2015  . Primary localized osteoarthritis of right knee 07/02/2015  . Tobacco abuse   . Type 2 diabetes mellitus (HCC)    insulin pump     Physical Exam: General: The patient is alert and oriented x3 in no acute distress.  Dermatology: Ulcer noted to the medial aspect of the right second toe stump measuring approximately 1.5 x 1.5 x 0.5 cm.  To the noted ulceration there is no eschar.  There is a moderate amount of slough fibrin and necrotic tissue noted.  There is exposed bone.  There is a moderate amount of serosanguineous drainage noted.  Periwound is erythematous and localized to the toe.  Vascular: Palpable pedal pulses bilaterally. No edema or erythema noted extending to the proximal foot or ankle. Capillary refill within normal  limits.  Neurological: Epicritic and protective threshold absent bilaterally.   Musculoskeletal Exam: History of partial toe amputation right second toe   Assessment: 1.  Ulcer right second toe with exposed bone secondary to diabetes mellitus 2.  Diabetes mellitus with peripheral polyneuropathy   Plan of Care:  1. Patient evaluated.  2.  Today after evaluating the patient and seeing that there is exposed bone sticking out of the second toe, there is likely osteomyelitis and I do recommend second toe amputation of the right foot at the level of the MTPJ.  The patient agrees.  All possible complications and details of the procedure were explained.  No guarantees were expressed or implied. 3.  Authorization for surgery was initiated today.  Surgery will consist of right second toe amputation 4.  We will try to get the surgery performed soon as possible.  In the meantime continue oral antibiotics 5.  Return to clinic 1 week postop      Edrick Kins, DPM [facility]  Dr. Edrick Kins, DPM    [address], [place] [address]  Office [phone]  Fax [phone]

## 2019-08-01 ENCOUNTER — Other Ambulatory Visit: Payer: Self-pay | Admitting: Podiatry

## 2019-08-01 DIAGNOSIS — M86171 Other acute osteomyelitis, right ankle and foot: Secondary | ICD-10-CM | POA: Diagnosis not present

## 2019-08-01 DIAGNOSIS — L03031 Cellulitis of right toe: Secondary | ICD-10-CM | POA: Diagnosis not present

## 2019-08-01 DIAGNOSIS — L97512 Non-pressure 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: Secondary | ICD-10-CM

## 2019-08-01 MED ORDER — CLINDAMYCIN HCL 300 MG PO CAPS: 300.0000 mg | ORAL_CAPSULE | Freq: Three times a day (TID) | ORAL | 0 refills | Status: DC

## 2019-08-01 MED ORDER — OXYCODONE-ACETAMINOPHEN 5-325 MG PO TABS: 1.0000 | ORAL_TABLET | Freq: Four times a day (QID) | ORAL | 0 refills | Status: DC | PRN

## 2019-08-01 NOTE — Progress Notes (Signed)
PRN postop 

## 2019-08-06 ENCOUNTER — Telehealth: Payer: Self-pay | Admitting: Podiatry

## 2019-08-06 ENCOUNTER — Telehealth: Payer: Self-pay | Admitting: Cardiovascular Disease

## 2019-08-06 NOTE — Telephone Encounter (Signed)
Pt presented to office in surgical sandal with hand towel wrapped around the foot. I took pt to Nursing Procedure room and removed the wet surgical shoe and wet towel, wet non-skid house sock and the entire dressing that was wet to the surgical site. I evaluated the surgery site and there was no redness or drainage, small area of maceration at the last plantar suture, I painted the suture line with betadine and covered with a sterile surgical dressing with padding and ace wrap. I put a tall plastic bag over the surgery foot, taped at the arch, ankle and above the calf and instructed pt to wrap a towel around the foot, then place in a large trash bag, then tape around the arch and ankle, put another towel at the top of the calf, pull the plastic trash bag up and tape snuggly with duct tape on the bag and direct the shower flow away from the surgical extremity or better yet birdbath. Pt states understanding.

## 2019-08-06 NOTE — Telephone Encounter (Signed)
Pt called and state dthat she showered and now bandages are damp DOS 08/01/19 pt states that boot is wet as well please advise

## 2019-08-06 NOTE — Telephone Encounter (Signed)
Pt called and wanted to know about changing the bandages pt was advised not to until pov 08/09/19

## 2019-08-06 NOTE — Telephone Encounter (Signed)
New Message    Patient is calling in reference to experiencing chest tightness for the last three hours that is going up to her neck and back. She was not sure if she should go to ER or not. Connect to nurse

## 2019-08-06 NOTE — Telephone Encounter (Signed)
I spoke with pt and she states the dressing is wet and the boot also. I told pt the dry the boot and come to the office for a dressing change. Pt states she can be in office in 30 minutes.

## 2019-08-06 NOTE — Telephone Encounter (Signed)
Spoke to patient. She states has been having chest pain for 3 hours.   Radiation to her back. No diaphoresis or nausea.  No shortness of breath. Patient has not seen a cardiologist since 2017 .  RN informed patient if she is having active chest pain  Recommend she goes to [facility] ER by EMS Patient verbalized understanding. She is aware the  Emergency room doctors will evaluate  If cardiology is need they will be consulted.

## 2019-08-09 ENCOUNTER — Ambulatory Visit (INDEPENDENT_AMBULATORY_CARE_PROVIDER_SITE_OTHER): Payer: Medicare Other | Admitting: Podiatry

## 2019-08-09 ENCOUNTER — Other Ambulatory Visit: Payer: Self-pay

## 2019-08-09 ENCOUNTER — Ambulatory Visit (INDEPENDENT_AMBULATORY_CARE_PROVIDER_SITE_OTHER): Payer: Medicare Other

## 2019-08-09 DIAGNOSIS — L97512 Non-pressure 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: Secondary | ICD-10-CM

## 2019-08-09 DIAGNOSIS — Z89421 Acquired absence of other right toe(s): Secondary | ICD-10-CM

## 2019-08-13 ENCOUNTER — Encounter: Payer: Self-pay | Admitting: Podiatry

## 2019-08-13 NOTE — Progress Notes (Signed)
Subjective:  Patient ID: [name], female    DOB: [birth year],  MRN: [id number]  Chief Complaint  Patient presents with  . Routine Post Op    POV #1 DOS 08/01/2019 RT 2ND TOE AMPUTATION/DR EVANS PT     64 y.o. female returns for post-op check.  Patient is doing well.  The bandages have been intact.  No pain.  Patient not taking any pain meds.  She is healing well.  She denies any acute complaints.  Review of Systems: Negative except as noted in the HPI. Denies N/V/F/Ch.  Past Medical History:  Diagnosis Date  . Anxiety   . Arthritis    knees  . Coronary artery disease   . Depression   . Fatty liver   . History of nuclear stress test 12/16/2011   exercise myoview; normal images with 2-56mm ST-segment depression - subsequent cath revelaed subtotally occluded small 2nd marginal branch & 75% PDA lesion, normal LV function  . Hypertension   . Hypothyroidism   . Neuropathy    in feet  . OSA on CPAP    AHI = 44 (per patient) setting 13 per pt  . Personal history of kidney stones   . Primary localized osteoarthritis of left knee 04/24/2015  . Primary localized osteoarthritis of right knee 07/02/2015  . Tobacco abuse   . Type 2 diabetes mellitus (HCC)    insulin pump    Current Outpatient Medications:  .  ACCU-CHEK AVIVA PLUS test strip, , Disp: , Rfl:  .  ALPRAZolam (XANAX) 0.5 MG tablet, Take 0.5 mg by mouth at bedtime as needed for anxiety. , Disp: , Rfl:  .  alum & mag hydroxide-simeth (MAALOX/MYLANTA) 200-200-20 MG/5ML suspension, Take 15 mLs by mouth every 4 (four) hours as needed for indigestion or heartburn., Disp: 355 mL, Rfl: 0 .  atorvastatin (LIPITOR) 20 MG tablet, Take 20 mg by mouth daily., Disp: , Rfl:  .  buPROPion (WELLBUTRIN SR) 100 MG 12 hr tablet, Take 100 mg by mouth every 12 (twelve) hours., Disp: , Rfl: 2 .  cephALEXin (KEFLEX) 500 MG capsule, Take 1 capsule (500 mg total) by mouth 4 (four) times daily., Disp: 28 capsule, Rfl: 0 .  Cholecalciferol  (VITAMIN D PO), Take by mouth., Disp: , Rfl:  .  clindamycin (CLEOCIN) 300 MG capsule, Take 1 capsule (300 mg total) by mouth 3 (three) times daily., Disp: 30 capsule, Rfl: 0 .  escitalopram (LEXAPRO) 20 MG tablet, Take 20 mg by mouth daily., Disp: , Rfl:  .  gabapentin (NEURONTIN) 300 MG capsule, Take 600 mg by mouth 3 (three) times daily. , Disp: , Rfl:  .  gentamicin cream (GARAMYCIN) 0.1 %, Apply 1 application topically 3 (three) times daily., Disp: 30 g, Rfl: 1 .  Insulin Human (INSULIN PUMP) SOLN, Inject 1 each into the skin continuous. Humalog - basal rate 3.2 Units/hr (Patient taking differently: Inject 1 each into the skin continuous. Novolog - basal rate 3.2 units/hr), Disp: , Rfl:  .  levothyroxine (SYNTHROID, LEVOTHROID) 125 MCG tablet, Take 125 mcg by mouth daily., Disp: , Rfl: 2 .  levothyroxine (SYNTHROID, LEVOTHROID) 137 MCG tablet, , Disp: , Rfl:  .  losartan-hydrochlorothiazide (HYZAAR) 100-12.5 MG tablet, Take 1 tablet by mouth daily., Disp: , Rfl: 3 .  NOVOLOG 100 UNIT/ML injection, UTD INJ UP TO A MAX OF 122 UNITS [place] PER DAY VIA INSULIN PUMP, Disp: , Rfl: 2 .  ondansetron (ZOFRAN) 4 MG tablet, Take 1 tablet (4 mg total) by mouth  every 6 (six) hours as needed for nausea., Disp: 20 tablet, Rfl: 0 .  OVER THE COUNTER MEDICATION, Pt uses CPAP machine daily., Disp: , Rfl:  .  oxyCODONE-acetaminophen (PERCOCET) 5-325 MG tablet, Take 1 tablet by mouth every 6 (six) hours as needed for severe pain., Disp: 30 tablet, Rfl: 0 .  Probiotic CAPS, Take 1 capsule by mouth daily., Disp: 30 capsule, Rfl: 1 .  silver sulfADIAZINE (SILVADENE) 1 % cream, Apply 1 application topically daily., Disp: 50 g, Rfl: 0  Social History   Tobacco Use  Smoking Status Former Smoker  . Packs/day: 1.00  . Years: 25.00  . Pack years: 25.00  . Types: Cigarettes  Smokeless Tobacco Never Used  Tobacco Comment   quit April 2021    Allergies  Allergen Reactions  . Penicillins Hives    Has patient had a  PCN reaction causing immediate rash, facial/tongue/throat swelling, SOB or lightheadedness with hypotension: Unknown Has patient had a PCN reaction causing severe rash involving mucus membranes or skin necrosis: Unknown Has patient had a PCN reaction that required hospitalization: Unknown Has patient had a PCN reaction occurring within the last 10 years: No If all of the above answers are "NO", then may proceed with Cephalosporin use.   Objective:  There were no vitals filed for this visit. There is no height or weight on file to calculate BMI. Constitutional Well developed. Well nourished.  Vascular Foot warm and well perfused. Capillary refill normal to all digits.   Neurologic Normal speech. Oriented to person, place, and time. Epicritic sensation to light touch grossly present bilaterally.  Dermatologic Skin healing well without signs of infection. Skin edges well coapted without signs of infection.  Orthopedic: Tenderness to palpation noted about the surgical site.   Radiographs: Second digit amputation noted.  No osteomyelitic changes noted.  No soft tissue emphysema noted. Assessment:   1. Ulcer of right second toe with fat layer exposed ([place])   2. History of amputation of lesser toe, right ([place])    Plan:  Patient was evaluated and treated and all questions answered.  S/p foot surgery right -Progressing as expected post-operatively. -XR: See above -WB Status: Weightbearing as tolerated in surgical shoe -Sutures: Intact.  No signs of dehiscence noted.  No clinical signs of infection noted. -Medications: None -Foot redressed.  No follow-ups on file.

## 2019-08-19 ENCOUNTER — Other Ambulatory Visit: Payer: Self-pay

## 2019-08-19 ENCOUNTER — Ambulatory Visit (INDEPENDENT_AMBULATORY_CARE_PROVIDER_SITE_OTHER): Payer: Medicare Other | Admitting: Podiatry

## 2019-08-19 DIAGNOSIS — Z9889 Other specified postprocedural states: Secondary | ICD-10-CM

## 2019-08-19 DIAGNOSIS — E0843 Diabetes mellitus due to underlying condition with diabetic autonomic (poly)neuropathy: Secondary | ICD-10-CM

## 2019-08-19 DIAGNOSIS — L97512 Non-pressure 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: Secondary | ICD-10-CM

## 2019-08-19 MED ORDER — DOXYCYCLINE HYCLATE 100 MG PO TABS
100.0000 mg | ORAL_TABLET | Freq: Two times a day (BID) | ORAL | 0 refills | Status: DC
Start: 2019-08-19 — End: 2020-11-04

## 2019-08-19 NOTE — Progress Notes (Signed)
   Subjective:  Patient presents today status post right second toe amputation. DOS: 08/01/2019.  Patient states that she is doing well.  She continues to have some swelling to the right foot and leg.  She states that the redness over the foot has improved however there is some localized redness around the amputation site.  She has been wearing her surgical shoe as directed.  No new complaints at this time  Past Medical History:  Diagnosis Date  . Anxiety   . Arthritis    knees  . Coronary artery disease   . Depression   . Fatty liver   . History of nuclear stress test 12/16/2011   exercise myoview; normal images with 2-39mm ST-segment depression - subsequent cath revelaed subtotally occluded small 2nd marginal branch & 75% PDA lesion, normal LV function  . Hypertension   . Hypothyroidism   . Neuropathy    in feet  . OSA on CPAP    AHI = 44 (per patient) setting 13 per pt  . Personal history of kidney stones   . Primary localized osteoarthritis of left knee 04/24/2015  . Primary localized osteoarthritis of right knee 07/02/2015  . Tobacco abuse   . Type 2 diabetes mellitus (HCC)    insulin pump      Objective/Physical Exam Neurovascular status intact.  Skin incisions appear to be well coapted with sutures intact. No sign of infectious process noted. No dehiscence. No active bleeding noted.  There is some very mild erythema around the incision site as well as diffuse edema to the right foot and ankle and leg.  Assessment: 1. s/p right second toe amputation. DOS: 08/01/2019   Plan of Care:  1. Patient was evaluated. 2.  Sutures removed today 3.  Today we will prescribe doxycycline 100 mg for possible cellulitis just to make sure there is no infection in her right lower extremity still 4.  Recommend resuming compression socks that the patient has 5.  Patient may transition out of the postoperative shoe into good supportive diabetic shoes and insoles 6.  Return to clinic in 3  weeks   Edrick Kins, DPM [facility]  Dr. Edrick Kins, [address]                                        [place], [place] [address]                Office [phone]  Fax [phone]

## 2019-09-02 ENCOUNTER — Ambulatory Visit (INDEPENDENT_AMBULATORY_CARE_PROVIDER_SITE_OTHER): Payer: Medicare Other

## 2019-09-02 ENCOUNTER — Ambulatory Visit (INDEPENDENT_AMBULATORY_CARE_PROVIDER_SITE_OTHER): Payer: Medicare Other | Admitting: Podiatry

## 2019-09-02 ENCOUNTER — Other Ambulatory Visit: Payer: Self-pay

## 2019-09-02 DIAGNOSIS — L97512 Non-pressure 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: Secondary | ICD-10-CM | POA: Diagnosis not present

## 2019-09-02 DIAGNOSIS — Z9889 Other specified postprocedural states: Secondary | ICD-10-CM

## 2019-09-02 DIAGNOSIS — E0843 Diabetes mellitus due to underlying condition with diabetic autonomic (poly)neuropathy: Secondary | ICD-10-CM

## 2019-09-02 NOTE — Progress Notes (Signed)
   Subjective:  Patient presents today status post right second toe amputation. DOS: 08/01/2019.  Patient is doing very well.  She is weightbearing full activity without any complications.  She denies pain and she is neuropathic.  No new complaints at this time  Past Medical History:  Diagnosis Date  . Anxiety   . Arthritis    knees  . Coronary artery disease   . Depression   . Fatty liver   . History of nuclear stress test 12/16/2011   exercise myoview; normal images with 2-32mm ST-segment depression - subsequent cath revelaed subtotally occluded small 2nd marginal branch & 75% PDA lesion, normal LV function  . Hypertension   . Hypothyroidism   . Neuropathy    in feet  . OSA on CPAP    AHI = 44 (per patient) setting 13 per pt  . Personal history of kidney stones   . Primary localized osteoarthritis of left knee 04/24/2015  . Primary localized osteoarthritis of right knee 07/02/2015  . Tobacco abuse   . Type 2 diabetes mellitus (HCC)    insulin pump      Objective/Physical Exam Neurovascular status intact.  Skin incisions appear to be well coapted and healed. No sign of infectious process noted. No dehiscence. No active bleeding noted.  There continues to be some chronic mild edema to the bilateral lower extremities  Assessment: 1. s/p right second toe amputation. DOS: 08/01/2019 2.  Edema bilateral lower extremities 3.  Diabetes mellitus with peripheral polyneuropathy   Plan of Care:  1. Patient was evaluated. 2.  Patient may resume full activity no restrictions wearing good supportive shoes 3.  Recommend good foot hygiene. 4.  Return to clinic in 3 months for routine foot care  Edrick Kins, DPM [facility]  Dr. Edrick Kins, [address]                                        [place],  [address]                Office [phone]  Fax [phone]

## 2019-10-16 ENCOUNTER — Ambulatory Visit (INDEPENDENT_AMBULATORY_CARE_PROVIDER_SITE_OTHER): Payer: Medicare Other | Admitting: Podiatry

## 2019-10-16 ENCOUNTER — Other Ambulatory Visit: Payer: Self-pay

## 2019-10-16 DIAGNOSIS — B351 Tinea unguium: Secondary | ICD-10-CM | POA: Diagnosis not present

## 2019-10-16 DIAGNOSIS — M79676 Pain in unspecified toe(s): Secondary | ICD-10-CM | POA: Diagnosis not present

## 2019-10-16 DIAGNOSIS — E0843 Diabetes mellitus due to underlying condition with diabetic autonomic (poly)neuropathy: Secondary | ICD-10-CM | POA: Diagnosis not present

## 2019-10-16 DIAGNOSIS — L308 Other specified dermatitis: Secondary | ICD-10-CM

## 2019-10-16 DIAGNOSIS — L989 Disorder of the skin and subcutaneous tissue, unspecified: Secondary | ICD-10-CM | POA: Diagnosis not present

## 2019-10-16 MED ORDER — CLOTRIMAZOLE-BETAMETHASONE 1-0.05 % EX CREA: 1.0000 "application " | TOPICAL_CREAM | Freq: Two times a day (BID) | CUTANEOUS | 1 refills | Status: DC

## 2019-10-16 NOTE — Progress Notes (Signed)
   Subjective:  Patient presents today for routine foot care.  Patient states that her nails have become elongated and thick.  She is unable to trim her own nails.  She recently underwent right second toe amputation on 08/01/2019 and it healed uneventfully.  Patient does have a new complaint today regarding pinpoint lesions to the plantar surface of the left foot.  She denies a history of trauma or injury.  She states they do not itch however she is getting little pinpoint vesicles developing.  This is been present for approximately 3-4 weeks now.  Past Medical History:  Diagnosis Date  . Anxiety   . Arthritis    knees  . Coronary artery disease   . Depression   . Fatty liver   . History of nuclear stress test 12/16/2011   exercise myoview; normal images with 2-71mm ST-segment depression - subsequent cath revelaed subtotally occluded small 2nd marginal branch & 75% PDA lesion, normal LV function  . Hypertension   . Hypothyroidism   . Neuropathy    in feet  . OSA on CPAP    AHI = 44 (per patient) setting 13 per pt  . Personal history of kidney stones   . Primary localized osteoarthritis of left knee 04/24/2015  . Primary localized osteoarthritis of right knee 07/02/2015  . Tobacco abuse   . Type 2 diabetes mellitus (HCC)    insulin pump      Objective: Physical Exam General: The patient is alert and oriented x3 in no acute distress.  Dermatology: Skin is cool, dry and supple bilateral lower extremities. Negative for open lesions or macerations.  Hyperkeratotic preulcerative callus lesions noted to the distal tips of the toes bilateral.  Vascular: Palpable pedal pulses bilaterally.  Moderate edema noted left ankle.  Capillary refill within normal limits.  Neurological: Epicritic and protective threshold absent bilaterally.   Musculoskeletal Exam: History of right second and third toe amputations    Assessment: 1. s/p right second toe amputation. DOS: 08/01/2019 2.  Edema  bilateral lower extremities 3.  Diabetes mellitus with peripheral polyneuropathy 4.  Pain due to onychomycosis of toenail both feet 5.  Preulcerative callus lesions bilateral 6.  Vesicular dermatitis left plantar foot   Plan of Care:  1. Patient was evaluated. 2.  Mechanical debridement of nails 1-5 was performed to the bilateral feet using a nail nipper without incident or bleeding 3.  Excisional debridement of the hyperkeratotic preulcerative callus lesions was performed using a tissue nipper without incident or bleeding 4.  Prescription for Lotrisone cream sent to the pharmacy apply daily to the vesicular lesions 5.  Return to clinic in 3 months  Edrick Kins, DPM [facility]  Dr. Edrick Kins, [address]                                        [place], [place] [address]                Office [phone]  Fax [phone]

## 2019-11-25 DIAGNOSIS — E1165 Type 2 diabetes mellitus with hyperglycemia: Secondary | ICD-10-CM | POA: Diagnosis not present

## 2019-11-25 DIAGNOSIS — E785 Hyperlipidemia, unspecified: Secondary | ICD-10-CM | POA: Diagnosis not present

## 2019-11-25 DIAGNOSIS — E039 Hypothyroidism, unspecified: Secondary | ICD-10-CM | POA: Diagnosis not present

## 2019-12-02 ENCOUNTER — Ambulatory Visit: Payer: Medicare Other | Admitting: Podiatry

## 2019-12-02 DIAGNOSIS — Z23 Encounter for immunization: Secondary | ICD-10-CM | POA: Diagnosis not present

## 2019-12-02 DIAGNOSIS — E039 Hypothyroidism, unspecified: Secondary | ICD-10-CM | POA: Diagnosis not present

## 2019-12-02 DIAGNOSIS — Z0000 Encounter for general adult medical examination without abnormal findings: Secondary | ICD-10-CM | POA: Diagnosis not present

## 2019-12-02 DIAGNOSIS — Z794 Long term (current) use of insulin: Secondary | ICD-10-CM | POA: Diagnosis not present

## 2019-12-02 DIAGNOSIS — E1129 Type 2 diabetes mellitus with other diabetic kidney complication: Secondary | ICD-10-CM | POA: Diagnosis not present

## 2019-12-02 DIAGNOSIS — I10 Essential (primary) hypertension: Secondary | ICD-10-CM | POA: Diagnosis not present

## 2019-12-02 DIAGNOSIS — E785 Hyperlipidemia, unspecified: Secondary | ICD-10-CM | POA: Diagnosis not present

## 2019-12-02 DIAGNOSIS — F329 Major depressive disorder, single episode, unspecified: Secondary | ICD-10-CM | POA: Diagnosis not present

## 2019-12-23 ENCOUNTER — Other Ambulatory Visit: Payer: Self-pay | Admitting: Internal Medicine

## 2019-12-23 DIAGNOSIS — Z1231 Encounter for screening mammogram for malignant neoplasm of breast: Secondary | ICD-10-CM

## 2019-12-27 DIAGNOSIS — I10 Essential (primary) hypertension: Secondary | ICD-10-CM | POA: Diagnosis not present

## 2019-12-27 DIAGNOSIS — Z794 Long term (current) use of insulin: Secondary | ICD-10-CM | POA: Diagnosis not present

## 2019-12-27 DIAGNOSIS — E1129 Type 2 diabetes mellitus with other diabetic kidney complication: Secondary | ICD-10-CM | POA: Diagnosis not present

## 2019-12-27 DIAGNOSIS — Z4681 Encounter for fitting and adjustment of insulin pump: Secondary | ICD-10-CM | POA: Diagnosis not present

## 2019-12-30 ENCOUNTER — Ambulatory Visit: Payer: Medicare Other | Admitting: Podiatry

## 2020-01-14 DIAGNOSIS — E1129 Type 2 diabetes mellitus with other diabetic kidney complication: Secondary | ICD-10-CM | POA: Diagnosis not present

## 2020-01-14 DIAGNOSIS — Z4681 Encounter for fitting and adjustment of insulin pump: Secondary | ICD-10-CM | POA: Diagnosis not present

## 2020-01-14 DIAGNOSIS — Z794 Long term (current) use of insulin: Secondary | ICD-10-CM | POA: Diagnosis not present

## 2020-01-14 DIAGNOSIS — I10 Essential (primary) hypertension: Secondary | ICD-10-CM | POA: Diagnosis not present

## 2020-01-23 DIAGNOSIS — Z23 Encounter for immunization: Secondary | ICD-10-CM | POA: Diagnosis not present

## 2020-01-27 ENCOUNTER — Other Ambulatory Visit: Payer: Self-pay

## 2020-01-27 ENCOUNTER — Ambulatory Visit (INDEPENDENT_AMBULATORY_CARE_PROVIDER_SITE_OTHER): Payer: Medicare Other | Admitting: Podiatry

## 2020-01-27 DIAGNOSIS — B351 Tinea unguium: Secondary | ICD-10-CM

## 2020-01-27 DIAGNOSIS — E0843 Diabetes mellitus due to underlying condition with diabetic autonomic (poly)neuropathy: Secondary | ICD-10-CM

## 2020-01-27 DIAGNOSIS — L989 Disorder of the skin and subcutaneous tissue, unspecified: Secondary | ICD-10-CM

## 2020-01-27 DIAGNOSIS — M79676 Pain in unspecified toe(s): Secondary | ICD-10-CM

## 2020-01-28 NOTE — Progress Notes (Signed)
   Subjective:  Patient presents today for routine foot care.  Patient states that her nails have become elongated and thick.  She is unable to trim her own nails.  She recently underwent right second toe amputation on 08/01/2019 and it healed uneventfully.   Past Medical History:  Diagnosis Date  . Anxiety   . Arthritis    knees  . Coronary artery disease   . Depression   . Fatty liver   . History of nuclear stress test 12/16/2011   exercise myoview; normal images with 2-16mm ST-segment depression - subsequent cath revelaed subtotally occluded small 2nd marginal branch & 75% PDA lesion, normal LV function  . Hypertension   . Hypothyroidism   . Neuropathy    in feet  . OSA on CPAP    AHI = 44 (per patient) setting 13 per pt  . Personal history of kidney stones   . Primary localized osteoarthritis of left knee 04/24/2015  . Primary localized osteoarthritis of right knee 07/02/2015  . Tobacco abuse   . Type 2 diabetes mellitus (HCC)    insulin pump      Objective: Physical Exam General: The patient is alert and oriented x3 in no acute distress.  Dermatology: Skin is cool, dry and supple bilateral lower extremities. Negative for open lesions or macerations.  Hyperkeratotic preulcerative callus lesions noted to the distal tips of the toes bilateral.  Vascular: Palpable pedal pulses bilaterally.  Moderate edema noted left ankle.  Capillary refill within normal limits.  Neurological: Epicritic and protective threshold absent bilaterally.   Musculoskeletal Exam: History of right second and third toe amputations   Assessment: 1. s/p right second toe amputation. DOS: 08/01/2019 2.  Edema bilateral lower extremities 3.  Diabetes mellitus with peripheral polyneuropathy 4.  Pain due to onychomycosis of toenail both feet 5.  Preulcerative callus lesions bilateral  Plan of Care:  1. Patient was evaluated. 2.  Mechanical debridement of nails 1-5 was performed to the bilateral feet using  a nail nipper without incident or bleeding 3.  Excisional debridement of the hyperkeratotic preulcerative callus lesions was performed using a tissue nipper without incident or bleeding 4.  Return to clinic in 3 months  Edrick Kins, DPM [facility]  Dr. Edrick Kins, [address]                                        [place], [place] [address]                Office [phone]  Fax [phone]

## 2020-01-31 ENCOUNTER — Other Ambulatory Visit: Payer: Self-pay

## 2020-01-31 ENCOUNTER — Other Ambulatory Visit: Payer: Self-pay | Admitting: Internal Medicine

## 2020-01-31 ENCOUNTER — Ambulatory Visit
Admission: RE | Admit: 2020-01-31 | Discharge: 2020-01-31 | Disposition: A | Discharge status: Home / Self Care | Payer: Medicare Other | Source: Ambulatory Visit | Attending: Internal Medicine | Admitting: Internal Medicine

## 2020-01-31 DIAGNOSIS — Z1231 Encounter for screening mammogram for malignant neoplasm of breast: Secondary | ICD-10-CM

## 2020-02-18 DIAGNOSIS — Z124 Encounter for screening for malignant neoplasm of cervix: Secondary | ICD-10-CM | POA: Diagnosis not present

## 2020-02-18 DIAGNOSIS — M810 Age-related osteoporosis without current pathological fracture: Secondary | ICD-10-CM | POA: Insufficient documentation

## 2020-02-18 DIAGNOSIS — N952 Postmenopausal atrophic vaginitis: Secondary | ICD-10-CM | POA: Diagnosis not present

## 2020-02-18 DIAGNOSIS — N200 Calculus of kidney: Secondary | ICD-10-CM | POA: Insufficient documentation

## 2020-02-18 DIAGNOSIS — Z6841 Body Mass Index (BMI) 40.0 and over, adult: Secondary | ICD-10-CM | POA: Diagnosis not present

## 2020-02-18 DIAGNOSIS — Z1272 Encounter for screening for malignant neoplasm of vagina: Secondary | ICD-10-CM | POA: Diagnosis not present

## 2020-03-02 ENCOUNTER — Ambulatory Visit
Admission: RE | Admit: 2020-03-02 | Discharge: 2020-03-02 | Disposition: A | Discharge status: Home / Self Care | Payer: Medicare Other | Source: Ambulatory Visit | Attending: Internal Medicine | Admitting: Internal Medicine

## 2020-03-02 ENCOUNTER — Other Ambulatory Visit: Payer: Self-pay

## 2020-03-03 ENCOUNTER — Other Ambulatory Visit: Payer: Self-pay | Admitting: Internal Medicine

## 2020-03-03 DIAGNOSIS — R928 Other abnormal and inconclusive findings on diagnostic imaging of breast: Secondary | ICD-10-CM

## 2020-03-04 DIAGNOSIS — Z794 Long term (current) use of insulin: Secondary | ICD-10-CM | POA: Diagnosis not present

## 2020-03-04 DIAGNOSIS — G609 Hereditary and idiopathic neuropathy, unspecified: Secondary | ICD-10-CM | POA: Diagnosis not present

## 2020-03-04 DIAGNOSIS — I10 Essential (primary) hypertension: Secondary | ICD-10-CM | POA: Diagnosis not present

## 2020-03-04 DIAGNOSIS — F329 Major depressive disorder, single episode, unspecified: Secondary | ICD-10-CM | POA: Diagnosis not present

## 2020-03-04 DIAGNOSIS — E1140 Type 2 diabetes mellitus with diabetic neuropathy, unspecified: Secondary | ICD-10-CM | POA: Diagnosis not present

## 2020-03-04 DIAGNOSIS — Z720 Tobacco use: Secondary | ICD-10-CM | POA: Diagnosis not present

## 2020-03-18 ENCOUNTER — Ambulatory Visit: Payer: PRIVATE HEALTH INSURANCE

## 2020-03-18 ENCOUNTER — Other Ambulatory Visit: Payer: Self-pay

## 2020-03-18 ENCOUNTER — Ambulatory Visit
Admission: RE | Admit: 2020-03-18 | Discharge: 2020-03-18 | Disposition: A | Discharge status: Home / Self Care | Payer: Medicare Other | Source: Ambulatory Visit | Attending: Internal Medicine | Admitting: Internal Medicine

## 2020-03-18 ENCOUNTER — Other Ambulatory Visit: Payer: Self-pay | Admitting: Internal Medicine

## 2020-03-18 DIAGNOSIS — R928 Other abnormal and inconclusive findings on diagnostic imaging of breast: Secondary | ICD-10-CM | POA: Diagnosis not present

## 2020-03-18 DIAGNOSIS — R922 Inconclusive mammogram: Secondary | ICD-10-CM | POA: Diagnosis not present

## 2020-04-13 DIAGNOSIS — H43393 Other vitreous opacities, bilateral: Secondary | ICD-10-CM | POA: Diagnosis not present

## 2020-04-13 DIAGNOSIS — Z961 Presence of intraocular lens: Secondary | ICD-10-CM | POA: Diagnosis not present

## 2020-04-13 DIAGNOSIS — E113292 Type 2 diabetes mellitus with mild nonproliferative diabetic retinopathy without macular edema, left eye: Secondary | ICD-10-CM | POA: Diagnosis not present

## 2020-04-13 DIAGNOSIS — H04123 Dry eye syndrome of bilateral lacrimal glands: Secondary | ICD-10-CM | POA: Diagnosis not present

## 2020-04-15 DIAGNOSIS — E785 Hyperlipidemia, unspecified: Secondary | ICD-10-CM | POA: Diagnosis not present

## 2020-04-15 DIAGNOSIS — Z4681 Encounter for fitting and adjustment of insulin pump: Secondary | ICD-10-CM | POA: Diagnosis not present

## 2020-04-15 DIAGNOSIS — E1129 Type 2 diabetes mellitus with other diabetic kidney complication: Secondary | ICD-10-CM | POA: Diagnosis not present

## 2020-04-15 DIAGNOSIS — Z794 Long term (current) use of insulin: Secondary | ICD-10-CM | POA: Diagnosis not present

## 2020-04-15 DIAGNOSIS — I10 Essential (primary) hypertension: Secondary | ICD-10-CM | POA: Diagnosis not present

## 2020-04-27 ENCOUNTER — Ambulatory Visit (INDEPENDENT_AMBULATORY_CARE_PROVIDER_SITE_OTHER): Payer: Medicare Other | Admitting: Podiatry

## 2020-04-27 ENCOUNTER — Other Ambulatory Visit: Payer: Self-pay

## 2020-04-27 DIAGNOSIS — L989 Disorder of the skin and subcutaneous tissue, unspecified: Secondary | ICD-10-CM

## 2020-04-27 DIAGNOSIS — B351 Tinea unguium: Secondary | ICD-10-CM

## 2020-04-27 DIAGNOSIS — M79676 Pain in unspecified toe(s): Secondary | ICD-10-CM | POA: Diagnosis not present

## 2020-04-27 DIAGNOSIS — E0843 Diabetes mellitus due to underlying condition with diabetic autonomic (poly)neuropathy: Secondary | ICD-10-CM | POA: Diagnosis not present

## 2020-04-27 NOTE — Progress Notes (Signed)
   Subjective:  Patient presents today for routine foot care.  Patient states that her nails have become elongated and thick.  She is unable to trim her own nails.  She recently underwent right second toe amputation on 08/01/2019 and it healed uneventfully.   Past Medical History:  Diagnosis Date  . Anxiety   . Arthritis    knees  . Coronary artery disease   . Depression   . Fatty liver   . History of nuclear stress test 12/16/2011   exercise myoview; normal images with 2-7mm ST-segment depression - subsequent cath revelaed subtotally occluded small 2nd marginal branch & 75% PDA lesion, normal LV function  . Hypertension   . Hypothyroidism   . Neuropathy    in feet  . OSA on CPAP    AHI = 44 (per patient) setting 13 per pt  . Personal history of kidney stones   . Primary localized osteoarthritis of left knee 04/24/2015  . Primary localized osteoarthritis of right knee 07/02/2015  . Tobacco abuse   . Type 2 diabetes mellitus (HCC)    insulin pump      Objective: Physical Exam General: The patient is alert and oriented x3 in no acute distress.  Dermatology: Skin is cool, dry and supple bilateral lower extremities. Negative for open lesions or macerations.  Hyperkeratotic preulcerative callus lesions noted to the distal tips of the toes bilateral.  Vascular: Palpable pedal pulses bilaterally.  Moderate edema noted left ankle.  Capillary refill within normal limits.  Neurological: Epicritic and protective threshold absent bilaterally.   Musculoskeletal Exam: History of right second and third toe amputations   Assessment: 1. s/p right second toe amputation. DOS: 08/01/2019 2.  Edema bilateral lower extremities 3.  Diabetes mellitus with peripheral polyneuropathy 4.  Pain due to onychomycosis of toenail both feet 5.  Preulcerative callus lesions bilateral  Plan of Care:  1. Patient was evaluated. 2.  Mechanical debridement of nails 1-5 was performed to the bilateral feet using  a nail nipper without incident or bleeding 3.  Excisional debridement of the hyperkeratotic preulcerative callus lesions was performed using a tissue nipper without incident or bleeding 4.  Return to clinic in 3 months  Edrick Kins, DPM [facility]  Dr. Edrick Kins, DPM    [address], [place] [address]                Office [phone]  Fax [phone]

## 2020-07-08 DIAGNOSIS — G609 Hereditary and idiopathic neuropathy, unspecified: Secondary | ICD-10-CM | POA: Diagnosis not present

## 2020-07-08 DIAGNOSIS — E1129 Type 2 diabetes mellitus with other diabetic kidney complication: Secondary | ICD-10-CM | POA: Diagnosis not present

## 2020-07-08 DIAGNOSIS — R531 Weakness: Secondary | ICD-10-CM | POA: Diagnosis not present

## 2020-07-08 DIAGNOSIS — Z720 Tobacco use: Secondary | ICD-10-CM | POA: Diagnosis not present

## 2020-07-08 DIAGNOSIS — Z794 Long term (current) use of insulin: Secondary | ICD-10-CM | POA: Diagnosis not present

## 2020-07-08 DIAGNOSIS — R2681 Unsteadiness on feet: Secondary | ICD-10-CM | POA: Diagnosis not present

## 2020-07-28 DIAGNOSIS — R269 Unspecified abnormalities of gait and mobility: Secondary | ICD-10-CM | POA: Diagnosis not present

## 2020-07-28 DIAGNOSIS — Z9181 History of falling: Secondary | ICD-10-CM | POA: Diagnosis not present

## 2020-07-29 ENCOUNTER — Other Ambulatory Visit: Payer: Self-pay

## 2020-07-29 ENCOUNTER — Ambulatory Visit (INDEPENDENT_AMBULATORY_CARE_PROVIDER_SITE_OTHER): Payer: Medicare Other | Admitting: Podiatry

## 2020-07-29 DIAGNOSIS — E0843 Diabetes mellitus due to underlying condition with diabetic autonomic (poly)neuropathy: Secondary | ICD-10-CM | POA: Diagnosis not present

## 2020-07-29 DIAGNOSIS — L989 Disorder of the skin and subcutaneous tissue, unspecified: Secondary | ICD-10-CM

## 2020-07-29 DIAGNOSIS — M79676 Pain in unspecified toe(s): Secondary | ICD-10-CM | POA: Diagnosis not present

## 2020-07-29 DIAGNOSIS — B351 Tinea unguium: Secondary | ICD-10-CM

## 2020-07-29 NOTE — Progress Notes (Signed)
   Subjective:  Patient presents today for routine foot care.  Patient states that her nails have become elongated and thick.  She is unable to trim her own nails.  She recently underwent right second toe amputation on 08/01/2019 and it healed uneventfully.   Past Medical History:  Diagnosis Date  . Anxiety   . Arthritis    knees  . Coronary artery disease   . Depression   . Fatty liver   . History of nuclear stress test 12/16/2011   exercise myoview; normal images with 2-7mm ST-segment depression - subsequent cath revelaed subtotally occluded small 2nd marginal branch & 75% PDA lesion, normal LV function  . Hypertension   . Hypothyroidism   . Neuropathy    in feet  . OSA on CPAP    AHI = 44 (per patient) setting 13 per pt  . Personal history of kidney stones   . Primary localized osteoarthritis of left knee 04/24/2015  . Primary localized osteoarthritis of right knee 07/02/2015  . Tobacco abuse   . Type 2 diabetes mellitus (HCC)    insulin pump      Objective: Physical Exam General: The patient is alert and oriented x3 in no acute distress.  Dermatology: Skin is cool, dry and supple bilateral lower extremities. Negative for open lesions or macerations.  Hyperkeratotic preulcerative callus lesions noted to the distal tips of the toes bilateral.  Vascular: Palpable pedal pulses bilaterally.  Moderate edema noted left ankle.  Capillary refill within normal limits.  Neurological: Epicritic and protective threshold absent bilaterally.   Musculoskeletal Exam: History of right second and third toe amputations   Assessment: 1. s/p right second toe amputation. DOS: 08/01/2019 2.  Edema bilateral lower extremities 3.  Diabetes mellitus with peripheral polyneuropathy 4.  Pain due to onychomycosis of toenail both feet 5.  Preulcerative callus lesions bilateral  Plan of Care:  1. Patient was evaluated. 2.  Mechanical debridement of nails 1-5 was performed to the bilateral feet using  a nail nipper without incident or bleeding 3.  Excisional debridement of the hyperkeratotic preulcerative callus lesions was performed using a tissue nipper without incident or bleeding 4.  Return to clinic in 3 months  Edrick Kins, DPM [facility]  Dr. Edrick Kins, DPM    [address], [place] [address]                Office [phone]  Fax [phone]

## 2020-08-04 DIAGNOSIS — Z9181 History of falling: Secondary | ICD-10-CM | POA: Diagnosis not present

## 2020-08-04 DIAGNOSIS — R269 Unspecified abnormalities of gait and mobility: Secondary | ICD-10-CM | POA: Diagnosis not present

## 2020-08-07 DIAGNOSIS — Z9181 History of falling: Secondary | ICD-10-CM | POA: Diagnosis not present

## 2020-08-07 DIAGNOSIS — R269 Unspecified abnormalities of gait and mobility: Secondary | ICD-10-CM | POA: Diagnosis not present

## 2020-08-10 DIAGNOSIS — R269 Unspecified abnormalities of gait and mobility: Secondary | ICD-10-CM | POA: Diagnosis not present

## 2020-08-10 DIAGNOSIS — Z9181 History of falling: Secondary | ICD-10-CM | POA: Diagnosis not present

## 2020-08-14 DIAGNOSIS — Z9181 History of falling: Secondary | ICD-10-CM | POA: Diagnosis not present

## 2020-08-14 DIAGNOSIS — R269 Unspecified abnormalities of gait and mobility: Secondary | ICD-10-CM | POA: Diagnosis not present

## 2020-08-17 DIAGNOSIS — R269 Unspecified abnormalities of gait and mobility: Secondary | ICD-10-CM | POA: Diagnosis not present

## 2020-08-17 DIAGNOSIS — Z9181 History of falling: Secondary | ICD-10-CM | POA: Diagnosis not present

## 2020-08-19 DIAGNOSIS — Z9181 History of falling: Secondary | ICD-10-CM | POA: Diagnosis not present

## 2020-08-19 DIAGNOSIS — R269 Unspecified abnormalities of gait and mobility: Secondary | ICD-10-CM | POA: Diagnosis not present

## 2020-08-25 DIAGNOSIS — R269 Unspecified abnormalities of gait and mobility: Secondary | ICD-10-CM | POA: Diagnosis not present

## 2020-08-25 DIAGNOSIS — Z9181 History of falling: Secondary | ICD-10-CM | POA: Diagnosis not present

## 2020-08-26 DIAGNOSIS — Z720 Tobacco use: Secondary | ICD-10-CM | POA: Diagnosis not present

## 2020-08-26 DIAGNOSIS — E1129 Type 2 diabetes mellitus with other diabetic kidney complication: Secondary | ICD-10-CM | POA: Diagnosis not present

## 2020-08-26 DIAGNOSIS — I10 Essential (primary) hypertension: Secondary | ICD-10-CM | POA: Diagnosis not present

## 2020-08-26 DIAGNOSIS — Z4681 Encounter for fitting and adjustment of insulin pump: Secondary | ICD-10-CM | POA: Diagnosis not present

## 2020-08-26 DIAGNOSIS — E785 Hyperlipidemia, unspecified: Secondary | ICD-10-CM | POA: Diagnosis not present

## 2020-08-26 DIAGNOSIS — Z794 Long term (current) use of insulin: Secondary | ICD-10-CM | POA: Diagnosis not present

## 2020-09-14 DIAGNOSIS — R269 Unspecified abnormalities of gait and mobility: Secondary | ICD-10-CM | POA: Diagnosis not present

## 2020-09-14 DIAGNOSIS — Z9181 History of falling: Secondary | ICD-10-CM | POA: Diagnosis not present

## 2020-09-16 DIAGNOSIS — R269 Unspecified abnormalities of gait and mobility: Secondary | ICD-10-CM | POA: Diagnosis not present

## 2020-09-16 DIAGNOSIS — Z9181 History of falling: Secondary | ICD-10-CM | POA: Diagnosis not present

## 2020-09-23 DIAGNOSIS — R269 Unspecified abnormalities of gait and mobility: Secondary | ICD-10-CM | POA: Diagnosis not present

## 2020-09-23 DIAGNOSIS — Z9181 History of falling: Secondary | ICD-10-CM | POA: Diagnosis not present

## 2020-09-28 DIAGNOSIS — Z9181 History of falling: Secondary | ICD-10-CM | POA: Diagnosis not present

## 2020-09-28 DIAGNOSIS — R269 Unspecified abnormalities of gait and mobility: Secondary | ICD-10-CM | POA: Diagnosis not present

## 2020-09-30 ENCOUNTER — Other Ambulatory Visit: Payer: Self-pay

## 2020-09-30 ENCOUNTER — Other Ambulatory Visit: Payer: Self-pay | Admitting: Podiatry

## 2020-09-30 ENCOUNTER — Ambulatory Visit (INDEPENDENT_AMBULATORY_CARE_PROVIDER_SITE_OTHER): Payer: Medicare Other

## 2020-09-30 ENCOUNTER — Ambulatory Visit (INDEPENDENT_AMBULATORY_CARE_PROVIDER_SITE_OTHER): Payer: Medicare Other | Admitting: Podiatry

## 2020-09-30 DIAGNOSIS — E08621 Diabetes mellitus due to underlying condition with foot ulcer: Secondary | ICD-10-CM

## 2020-09-30 DIAGNOSIS — L97512 Non-pressure 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: Secondary | ICD-10-CM

## 2020-09-30 NOTE — Progress Notes (Signed)
Subjective:  65 y.o. female with PMHx of diabetes mellitus presenting today for new complaint regarding discoloration with possible bleeding to the plantar aspect of the right foot.  Patient has history of osteomyelitis with prior toe amputations.  Patient denies any history of trauma or injury.  The only eliciting factor that she could possibly think of is that she changed her shoes recently.  She presents for further treatment and evaluation.  Currently she has not done anything for treatment   Past Medical History:  Diagnosis Date   Anxiety    Arthritis    knees   Coronary artery disease    Depression    Fatty liver    History of nuclear stress test 12/16/2011   exercise myoview; normal images with 2-38m ST-segment depression - subsequent cath revelaed subtotally occluded small 2nd marginal branch & 75% PDA lesion, normal LV function   Hypertension    Hypothyroidism    Neuropathy    in feet   OSA on CPAP    AHI = 44 (per patient) setting 13 per pt   Personal history of kidney stones    Primary localized osteoarthritis of left knee 04/24/2015   Primary localized osteoarthritis of right knee 07/02/2015   Tobacco abuse    Type 2 diabetes mellitus (HCC)    insulin pump       Objective/Physical Exam General: The patient is alert and oriented x3 in no acute distress.  Dermatology:  Wound #1 noted to the plantar aspect of the first MTPJ right foot measuring approximately 0.6 on 0.6 and 0.2 cm (LxWxD).   To the noted ulceration(s), there is no eschar. There is a moderate amount of slough, fibrin, and necrotic tissue noted. Granulation tissue and wound base is red. There is a minimal amount of serosanguineous drainage noted. There is no exposed bone muscle-tendon ligament or joint. There is no malodor. Periwound integrity is intact. Skin is warm, dry and supple bilateral lower extremities.  Vascular: Palpable pedal pulses bilaterally. No edema or erythema noted. Capillary refill  within normal limits.  Neurological: Epicritic and protective threshold diminished bilaterally.   Musculoskeletal Exam: History of prior second and third toe amputations  Radiographic exam: No evidence of osteolytic erosion or cortical irregularity that would be concerning for osteomyelitis at the moment.  History of prior toe amputations at the MTP second and third toes.  Diffuse degenerative changes noted consistent with chronic osteoarthritis with possible Charcot degenerative changes of the midfoot given the patient's history of diabetes mellitus  Assessment: 1.  Ulcer subfirst MTPJ right foot secondary to diabetes mellitus 2. diabetes mellitus w/ peripheral neuropathy   Plan of Care:  1. Patient was evaluated. 2. medically necessary excisional debridement including subcutaneous tissue was performed using a tissue nipper and a chisel blade. Excisional debridement of all the necrotic nonviable tissue down to healthy bleeding viable tissue was performed with post-debridement measurements same as pre-. 3. the wound was cleansed and dry sterile dressing applied. 4.  Cultures taken and sent to pathology for culture and sensitivity  5.  Recommend Betadine ointment and a light dressing daily  6.  Postsurgical shoe dispensed.  Wear daily  7.  Patient is to return to clinic in 3 weeks.   BEdrick Kins DPM [facility]  Dr. BEdrick Kins DPM    [address]  [place], [place] [address]                Office [phone]  Fax [phone]

## 2020-10-02 ENCOUNTER — Telehealth: Payer: Self-pay | Admitting: Podiatry

## 2020-10-02 NOTE — Telephone Encounter (Signed)
Pt called wanting to know if she needs to continue going to physical therapy. She is afraid she's putting stress on her feet. Please advise.

## 2020-10-03 LAB — WOUND CULTURE
MICRO NUMBER:: 12226748
SPECIMEN QUALITY:: ADEQUATE

## 2020-10-03 LAB — HOUSE ACCOUNT TRACKING

## 2020-10-05 ENCOUNTER — Telehealth: Payer: Self-pay | Admitting: Podiatry

## 2020-10-05 NOTE — Telephone Encounter (Signed)
Patient calling to inquire if she should cancel her physical therapy appointments until the wound on her foot has healed. Patient states that PT has become more intense and thinks that may delay healing. Please advise.

## 2020-10-05 NOTE — Telephone Encounter (Signed)
Patient may discontinue physical therapy for the time being until we get the ulcers to heal. - [name]

## 2020-10-06 ENCOUNTER — Telehealth: Payer: Self-pay | Admitting: *Deleted

## 2020-10-06 NOTE — Telephone Encounter (Signed)
Returned call to patient,no answer, left vmessage giving Dr Amalia Hailey recommendations to discontinue PT until ulcers are healed

## 2020-10-21 ENCOUNTER — Ambulatory Visit: Payer: Medicare Other | Admitting: Podiatry

## 2020-11-04 ENCOUNTER — Ambulatory Visit (INDEPENDENT_AMBULATORY_CARE_PROVIDER_SITE_OTHER): Payer: Medicare Other | Admitting: Podiatry

## 2020-11-04 ENCOUNTER — Other Ambulatory Visit: Payer: Self-pay

## 2020-11-04 DIAGNOSIS — E08621 Diabetes mellitus due to underlying condition with foot ulcer: Secondary | ICD-10-CM

## 2020-11-04 DIAGNOSIS — L97512 Non-pressure 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: Secondary | ICD-10-CM

## 2020-11-04 DIAGNOSIS — F430 Acute stress reaction: Secondary | ICD-10-CM | POA: Insufficient documentation

## 2020-11-04 DIAGNOSIS — L03115 Cellulitis of right lower limb: Secondary | ICD-10-CM | POA: Diagnosis not present

## 2020-11-04 DIAGNOSIS — R269 Unspecified abnormalities of gait and mobility: Secondary | ICD-10-CM | POA: Insufficient documentation

## 2020-11-04 MED ORDER — DOXYCYCLINE HYCLATE 100 MG PO TABS: 100.0000 mg | ORAL_TABLET | Freq: Two times a day (BID) | ORAL | 0 refills | Status: DC

## 2020-11-04 NOTE — Progress Notes (Signed)
Subjective:  66 y.o. female with PMHx of diabetes mellitus presenting today for follow-up evaluation of an ulcer to the plantar aspect of the right first MTPJ.  Patient states that she has been very active and on her feet.  She has been wearing postsurgical shoe.  She presents for further treatment and evaluation   Past Medical History:  Diagnosis Date   Anxiety    Arthritis    knees   Coronary artery disease    Depression    Fatty liver    History of nuclear stress test 12/16/2011   exercise myoview; normal images with 2-12m ST-segment depression - subsequent cath revelaed subtotally occluded small 2nd marginal branch & 75% PDA lesion, normal LV function   Hypertension    Hypothyroidism    Neuropathy    in feet   OSA on CPAP    AHI = 44 (per patient) setting 13 per pt   Personal history of kidney stones    Primary localized osteoarthritis of left knee 04/24/2015   Primary localized osteoarthritis of right knee 07/02/2015   Tobacco abuse    Type 2 diabetes mellitus (HCC)    insulin pump        Objective/Physical Exam General: The patient is alert and oriented x3 in no acute distress.  Dermatology:  Wound #1 noted to the plantar aspect of the first MTPJ right foot measuring approximately 1.5 x 1.5 x 0.3 cm (LxWxD).   To the noted ulceration(s), there is no eschar. There is a moderate amount of slough, fibrin, and necrotic tissue noted. Granulation tissue and wound base is red. There is a minimal amount of serosanguineous drainage noted. There is no exposed bone muscle-tendon ligament or joint. There is no malodor. Periwound integrity is intact. Skin is warm, dry and supple bilateral lower extremities.  Vascular: Palpable pedal pulses bilaterally. No edema or erythema noted. Capillary refill within normal limits.  Neurological: Epicritic and protective threshold diminished bilaterally.   Musculoskeletal Exam: History of prior second and third toe amputations  Radiographic  exam RT foot 09/30/2020: No evidence of osteolytic erosion or cortical irregularity that would be concerning for osteomyelitis at the moment.  History of prior toe amputations at the MTP second and third toes.  Diffuse degenerative changes noted consistent with chronic osteoarthritis with possible Charcot degenerative changes of the midfoot given the patient's history of diabetes mellitus  Assessment: 1.  Ulcer subfirst MTPJ right foot secondary to diabetes mellitus 2. diabetes mellitus w/ peripheral neuropathy   Plan of Care:  1. Patient was evaluated. 2. medically necessary excisional debridement including subcutaneous tissue was performed using a tissue nipper and a chisel blade. Excisional debridement of all the necrotic nonviable tissue down to healthy bleeding viable tissue was performed with post-debridement measurements same as pre-. 3. the wound was cleansed and dry sterile dressing applied. 4.  Continue Betadine ointment and a light dressing daily 5.  Continue postsurgical shoe.  Offloading felt dancers pads were applied to the insole of the postsurgical shoe to offload pressure from the wound 6.  Prescription for doxycycline 100 mg 2 times daily #28 7.  Stressed the importance of reducing patient's activity.  She says that she goes to multiple church activities and stands on her feet throughout the week.  Recommend that she reduce her activity and refrain from periods of long standing on her feet  8.  Return to clinic in 3 weeks  BEdrick Kins DPM [facility]  Dr. BDorathy Daft  Amalia Hailey, DPM    [address], [place] [address]                Office [phone]  Fax [phone]

## 2020-11-10 ENCOUNTER — Ambulatory Visit (INDEPENDENT_AMBULATORY_CARE_PROVIDER_SITE_OTHER): Payer: Medicare Other

## 2020-11-10 ENCOUNTER — Ambulatory Visit (INDEPENDENT_AMBULATORY_CARE_PROVIDER_SITE_OTHER): Payer: Medicare Other | Admitting: Podiatry

## 2020-11-10 ENCOUNTER — Other Ambulatory Visit: Payer: Self-pay | Admitting: Podiatry

## 2020-11-10 ENCOUNTER — Other Ambulatory Visit: Payer: Self-pay

## 2020-11-10 ENCOUNTER — Encounter: Payer: Self-pay | Admitting: Podiatry

## 2020-11-10 DIAGNOSIS — L03115 Cellulitis of right lower limb: Secondary | ICD-10-CM

## 2020-11-10 DIAGNOSIS — E08621 Diabetes mellitus due to underlying condition with foot ulcer: Secondary | ICD-10-CM

## 2020-11-10 DIAGNOSIS — L97512 Non-pressure 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: Secondary | ICD-10-CM

## 2020-11-10 MED ORDER — GENTAMICIN SULFATE 0.1 % EX CREA
1.0000 | TOPICAL_CREAM | Freq: Two times a day (BID) | CUTANEOUS | 1 refills | Status: DC
Start: 2020-11-10 — End: 2021-04-12

## 2020-11-10 NOTE — Progress Notes (Signed)
Subjective:  65 y.o. female with PMHx of diabetes mellitus presenting today for follow-up evaluation of an ulcer to the plantar aspect of the right first MTPJ.  Patient states that she has been very active and on her feet.  She has been wearing postsurgical shoe.  She presents for further treatment and evaluation   Past Medical History:  Diagnosis Date   Anxiety    Arthritis    knees   Coronary artery disease    Depression    Fatty liver    History of nuclear stress test 12/16/2011   exercise myoview; normal images with 2-79mm ST-segment depression - subsequent cath revelaed subtotally occluded small 2nd marginal branch & 75% PDA lesion, normal LV function   Hypertension    Hypothyroidism    Neuropathy    in feet   OSA on CPAP    AHI = 44 (per patient) setting 13 per pt   Personal history of kidney stones    Primary localized osteoarthritis of left knee 04/24/2015   Primary localized osteoarthritis of right knee 07/02/2015   Tobacco abuse    Type 2 diabetes mellitus (HCC)    insulin pump        Objective/Physical Exam General: The patient is alert and oriented x3 in no acute distress.  Dermatology:  Wound #1 noted to the plantar aspect of the first MTPJ right foot measuring approximately 1.5 x 1.5 x 0.3 cm (LxWxD).   To the noted ulceration(s), there is no eschar. There is a moderate amount of slough, fibrin, and necrotic tissue noted. Granulation tissue and wound base is red. There is a minimal amount of serosanguineous drainage noted. There is no exposed bone muscle-tendon ligament or joint. There is no malodor. Periwound integrity is intact. Skin is warm, dry and supple bilateral lower extremities.  Vascular: Palpable pedal pulses bilaterally. No edema or erythema noted. Capillary refill within normal limits.  Neurological: Epicritic and protective threshold diminished bilaterally.   Musculoskeletal Exam: History of prior second and third toe amputations  Radiographic  exam RT foot: No evidence of osteolytic erosion or cortical irregularity that would be concerning for osteomyelitis at the moment.  History of prior toe amputations at the MTP second and third toes.  Diffuse degenerative changes noted consistent with chronic osteoarthritis with possible Charcot degenerative changes of the midfoot given the patient's history of diabetes mellitus  Assessment: 1.  Ulcer subfirst MTPJ right foot secondary to diabetes mellitus 2. diabetes mellitus w/ peripheral neuropathy   Plan of Care:  1. Patient was evaluated. 2. medically necessary excisional debridement including subcutaneous tissue was performed using a tissue nipper and a chisel blade. Excisional debridement of all the necrotic nonviable tissue down to healthy bleeding viable tissue was performed with post-debridement measurements same as pre-. 3. the wound was cleansed and dry sterile dressing applied. 4.  Discontinue Betadine ointment.  Prescription for gentamicin cream sent to the pharmacy 5.  Continue postsurgical shoe.  Additional offloading felt dancers pads were applied to the insole of the postsurgical shoe to offload pressure from the wound 6.  Continue the prescription for doxycycline 100 mg 2 times daily #28 7.  Stressed the importance of reducing patient's activity.  She says that she goes to multiple church activities and stands on her feet throughout the week.  Recommend that she reduce her activity and refrain from periods of long standing on her feet  8.  Cultures were taken and sent to pathology for culture and sensitivity.  Will adjust  antibiotics if needed  9.  Return to clinic next scheduled appointment  *Came in today with her husband, Jimmy Picket, DPM [facility]  Dr. Edrick Kins, DPM    [address], [place] [address]                Office [phone]  Fax [phone]

## 2020-11-18 DIAGNOSIS — G629 Polyneuropathy, unspecified: Secondary | ICD-10-CM | POA: Diagnosis not present

## 2020-11-18 DIAGNOSIS — I10 Essential (primary) hypertension: Secondary | ICD-10-CM | POA: Diagnosis not present

## 2020-11-18 DIAGNOSIS — E041 Nontoxic single thyroid nodule: Secondary | ICD-10-CM | POA: Diagnosis not present

## 2020-11-18 DIAGNOSIS — E1140 Type 2 diabetes mellitus with diabetic neuropathy, unspecified: Secondary | ICD-10-CM | POA: Diagnosis not present

## 2020-11-18 DIAGNOSIS — Z23 Encounter for immunization: Secondary | ICD-10-CM | POA: Diagnosis not present

## 2020-11-18 DIAGNOSIS — Z89429 Acquired absence of other toe(s), unspecified side: Secondary | ICD-10-CM | POA: Diagnosis not present

## 2020-11-18 DIAGNOSIS — E039 Hypothyroidism, unspecified: Secondary | ICD-10-CM | POA: Diagnosis not present

## 2020-11-18 DIAGNOSIS — E1129 Type 2 diabetes mellitus with other diabetic kidney complication: Secondary | ICD-10-CM | POA: Diagnosis not present

## 2020-11-18 DIAGNOSIS — Z720 Tobacco use: Secondary | ICD-10-CM | POA: Diagnosis not present

## 2020-11-19 ENCOUNTER — Other Ambulatory Visit: Payer: Self-pay | Admitting: Internal Medicine

## 2020-11-19 DIAGNOSIS — E041 Nontoxic single thyroid nodule: Secondary | ICD-10-CM

## 2020-11-23 LAB — WOUND CULTURE

## 2020-11-25 ENCOUNTER — Ambulatory Visit (INDEPENDENT_AMBULATORY_CARE_PROVIDER_SITE_OTHER): Payer: Medicare Other | Admitting: Podiatry

## 2020-11-25 ENCOUNTER — Other Ambulatory Visit: Payer: Self-pay

## 2020-11-25 DIAGNOSIS — E08621 Diabetes mellitus due to underlying condition with foot ulcer: Secondary | ICD-10-CM

## 2020-11-25 DIAGNOSIS — L97512 Non-pressure 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: Secondary | ICD-10-CM | POA: Diagnosis not present

## 2020-11-25 NOTE — Progress Notes (Signed)
Subjective:  65 y.o. female with PMHx of diabetes mellitus presenting today for follow-up evaluation of an ulcer to the plantar aspect of the right first MTPJ.  Patient states that she has been very active and on her feet.  She has been wearing postsurgical shoe.  She presents for further treatment and evaluation   Past Medical History:  Diagnosis Date   Anxiety    Arthritis    knees   Coronary artery disease    Depression    Fatty liver    History of nuclear stress test 12/16/2011   exercise myoview; normal images with 2-29mm ST-segment depression - subsequent cath revelaed subtotally occluded small 2nd marginal branch & 75% PDA lesion, normal LV function   Hypertension    Hypothyroidism    Neuropathy    in feet   OSA on CPAP    AHI = 44 (per patient) setting 13 per pt   Personal history of kidney stones    Primary localized osteoarthritis of left knee 04/24/2015   Primary localized osteoarthritis of right knee 07/02/2015   Tobacco abuse    Type 2 diabetes mellitus (HCC)    insulin pump      Objective/Physical Exam General: The patient is alert and oriented x3 in no acute distress.  Dermatology:  Wound #1 noted to the plantar aspect of the first MTPJ right foot measuring approximately 1.0x1.0 x 0.3 cm (LxWxD).  Overall there is significant improvement  To the noted ulceration(s), there is no eschar. There is a moderate amount of slough, fibrin, and necrotic tissue noted. Granulation tissue and wound base is red. There is a minimal amount of serosanguineous drainage noted. There is no exposed bone muscle-tendon ligament or joint. There is no malodor. Periwound integrity is intact. Skin is warm, dry and supple bilateral lower extremities.  Vascular: Palpable pedal pulses bilaterally. No edema or erythema noted. Capillary refill within normal limits.  Neurological: Epicritic and protective threshold diminished bilaterally.   Musculoskeletal Exam: History of prior second and  third toe amputations  Radiographic exam RT foot 11/10/2020: No evidence of osteolytic erosion or cortical irregularity that would be concerning for osteomyelitis at the moment.  History of prior toe amputations at the MTP second and third toes.  Diffuse degenerative changes noted consistent with chronic osteoarthritis with possible Charcot degenerative changes of the midfoot given the patient's history of diabetes mellitus  Assessment: 1.  Ulcer subfirst MTPJ right foot secondary to diabetes mellitus 2. diabetes mellitus w/ peripheral neuropathy   Plan of Care:  1. Patient was evaluated.  Cultures reviewed today that were taken last visit 2. medically necessary excisional debridement including subcutaneous tissue was performed using a tissue nipper and a chisel blade. Excisional debridement of all the necrotic nonviable tissue down to healthy bleeding viable tissue was performed with post-debridement measurements same as pre-. 3. the wound was cleansed and dry sterile dressing applied. 4.  Continue gentamicin cream daily with a light dressing  5.  Continue postsurgical shoe.  Continue offloading felt dancers pads were applied to the insole of the postsurgical shoe to offload pressure from the wound 6.  Continue reducing patient's activity.  She says that she goes to multiple church activities and stands on her feet throughout the week.  Recommend that she reduce her activity and refrain from periods of long standing on her feet  7.  Return to clinic in 3 weeks  *Husband's name is Jenny Reichmann. Pet dog is [place], DPM [facility]  Dr. Edrick Kins, DPM    [address], [place] [address]                Office [phone]  Fax [phone]

## 2020-12-07 ENCOUNTER — Ambulatory Visit
Admission: RE | Admit: 2020-12-07 | Discharge: 2020-12-07 | Disposition: A | Discharge status: Home / Self Care | Payer: Medicare Other | Source: Ambulatory Visit | Attending: Internal Medicine | Admitting: Internal Medicine

## 2020-12-07 DIAGNOSIS — E041 Nontoxic single thyroid nodule: Secondary | ICD-10-CM | POA: Diagnosis not present

## 2020-12-16 ENCOUNTER — Other Ambulatory Visit: Payer: Self-pay

## 2020-12-16 ENCOUNTER — Ambulatory Visit (INDEPENDENT_AMBULATORY_CARE_PROVIDER_SITE_OTHER): Payer: Medicare Other | Admitting: Podiatry

## 2020-12-16 DIAGNOSIS — L97512 Non-pressure 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: Secondary | ICD-10-CM

## 2020-12-16 DIAGNOSIS — E08621 Diabetes mellitus due to underlying condition with foot ulcer: Secondary | ICD-10-CM

## 2020-12-16 NOTE — Progress Notes (Signed)
Subjective:  65 y.o. female with PMHx of diabetes mellitus presenting today for follow-up evaluation of an ulcer to the plantar aspect of the right first MTPJ.  Patient states that she has been very active and on her feet.  She has been wearing postsurgical shoe.  She presents for further treatment and evaluation   Past Medical History:  Diagnosis Date   Anxiety    Arthritis    knees   Coronary artery disease    Depression    Fatty liver    History of nuclear stress test 12/16/2011   exercise myoview; normal images with 2-13mm ST-segment depression - subsequent cath revelaed subtotally occluded small 2nd marginal branch & 75% PDA lesion, normal LV function   Hypertension    Hypothyroidism    Neuropathy    in feet   OSA on CPAP    AHI = 44 (per patient) setting 13 per pt   Personal history of kidney stones    Primary localized osteoarthritis of left knee 04/24/2015   Primary localized osteoarthritis of right knee 07/02/2015   Tobacco abuse    Type 2 diabetes mellitus (HCC)    insulin pump      Objective/Physical Exam General: The patient is alert and oriented x3 in no acute distress.  Dermatology:  Wound #1 noted to the plantar aspect of the first MTPJ right foot measuring approximately 1.0x1.0 x 0.3 cm (LxWxD).  Overall there is significant improvement  To the noted ulceration(s), there is no eschar. There is a moderate amount of slough, fibrin, and necrotic tissue noted. Granulation tissue and wound base is red. There is a minimal amount of serosanguineous drainage noted. There is no exposed bone muscle-tendon ligament or joint. There is no malodor. Periwound integrity is intact. Skin is warm, dry and supple bilateral lower extremities.  Vascular: Palpable pedal pulses bilaterally. No edema or erythema noted. Capillary refill within normal limits.  Neurological: Epicritic and protective threshold diminished bilaterally.   Musculoskeletal Exam: History of prior second and  third toe amputations  Radiographic exam RT foot 11/10/2020: No evidence of osteolytic erosion or cortical irregularity that would be concerning for osteomyelitis at the moment.  History of prior toe amputations at the MTP second and third toes.  Diffuse degenerative changes noted consistent with chronic osteoarthritis with possible Charcot degenerative changes of the midfoot given the patient's history of diabetes mellitus  Assessment: 1.  Ulcer subfirst MTPJ right foot secondary to diabetes mellitus 2. diabetes mellitus w/ peripheral neuropathy   Plan of Care:  1. Patient was evaluated. 2. medically necessary excisional debridement including subcutaneous tissue was performed using a tissue nipper and a chisel blade. Excisional debridement of all the necrotic nonviable tissue down to healthy bleeding viable tissue was performed with post-debridement measurements same as pre-. 3. the wound was cleansed and dry sterile dressing applied. 4.  Discontinue gentamicin cream. Gelling fiber silver alginate was provided for the patient to apply daily 5.  Continue postsurgical shoe with offloading felt dancers pads 6.  Continue reducing patient's activity.  She says that she goes to multiple church activities and stands on her feet throughout the week.  Recommend that she reduce her activity and refrain from periods of long standing on her feet  7.  Today were going to place a referral for wound care for second opinion.  Their expertise is greatly appreciated and it would be nice for another physician to see the wound and possibly recommend other options to alleviate pressure from the  area  8.  Order also placed for knee scooter today to see if insurance will cover it  9.  Return to clinic in 3 weeks  *Husband's name is Jenny Reichmann. Pet dog is [place]  Edrick Kins, DPM [facility]  Dr. Edrick Kins, DPM    [address], [place] [address]                 Office [phone]  Fax [phone]

## 2020-12-17 ENCOUNTER — Encounter (HOSPITAL_BASED_OUTPATIENT_CLINIC_OR_DEPARTMENT_OTHER): Payer: Medicare Other | Attending: Internal Medicine | Admitting: Internal Medicine

## 2020-12-17 DIAGNOSIS — S91301A Unspecified open wound, right foot, initial encounter: Secondary | ICD-10-CM | POA: Insufficient documentation

## 2020-12-17 DIAGNOSIS — F17210 Nicotine dependence, cigarettes, uncomplicated: Secondary | ICD-10-CM | POA: Diagnosis not present

## 2020-12-17 DIAGNOSIS — X58XXXA Exposure to other specified factors, initial encounter: Secondary | ICD-10-CM | POA: Diagnosis not present

## 2020-12-17 DIAGNOSIS — F17290 Nicotine dependence, other tobacco product, uncomplicated: Secondary | ICD-10-CM | POA: Diagnosis not present

## 2020-12-17 DIAGNOSIS — E11621 Type 2 diabetes mellitus with foot ulcer: Secondary | ICD-10-CM | POA: Diagnosis not present

## 2020-12-17 DIAGNOSIS — E1142 Type 2 diabetes mellitus with diabetic polyneuropathy: Secondary | ICD-10-CM | POA: Insufficient documentation

## 2020-12-17 DIAGNOSIS — Z794 Long term (current) use of insulin: Secondary | ICD-10-CM | POA: Insufficient documentation

## 2020-12-18 ENCOUNTER — Telehealth: Payer: Self-pay | Admitting: *Deleted

## 2020-12-18 NOTE — Telephone Encounter (Signed)
Faxed order (12/18/20) for a knee scooter to [facility] and called them , patient will have to pay out of pocket,contact insurance to reimburse, Informed the patient.

## 2020-12-18 NOTE — Progress Notes (Signed)
KENNA, SEWARD ([id number]) Visit Report for 12/17/2020 Chief Complaint Document Details Patient Name: Date of Service: [name] [name] 12/17/2020 7:30 A M Medical Record Number: [id number] Patient Account Number: [id number] Date of Birth/Sex: Treating RN: Feb 16, 1956 (65 y.o. Female) Rhae Hammock Primary Care Provider: [name] Other Clinician: Referring Provider: Treating Provider/Extender: Daiva Eves in Treatment: 0 Information Obtained from: Patient Chief Complaint Right Plantar foot wound Electronic Signature(s) Signed: 12/17/2020 6:39:23 PM By: Kalman Shan DO Entered By: Kalman Shan on 12/17/2020 18:27:51 -------------------------------------------------------------------------------- HPI Details Patient Name: Date of Service: [name], [name] 12/17/2020 7:30 A M Medical Record Number: [id number] Patient Account Number: [id number] Date of Birth/Sex: Treating RN: [birth year] (65 y.o. Female) Rhae Hammock Primary Care Provider: [name] Other Clinician: Referring Provider: Treating Provider/Extender: Daiva Eves in Treatment: 0 History of Present Illness HPI Description: Admission 12/17/2020 [name] a 65 year old female with a past medical history of insulin-dependent type 2 diabetes, hypothyroidism and daily1 pack per day cigarette smoker the presents to the clinic for a 6-week history of nonhealing wound to the right first MTPJ. She has been following with [name], podiatry for this issue. She has been using silver alginate with dressing changes. She uses a postsurgical shoe and offloading pads. She currently denies signs of infection. Electronic Signature(s) Signed: 12/17/2020 6:39:23 PM By: Kalman Shan DO Entered By: Kalman Shan on 12/17/2020  18:28:24 -------------------------------------------------------------------------------- Physical Exam Details Patient Name: Date of Service: [name], [name] 12/17/2020 7:30 A M Medical Record Number: [id number] Patient Account Number: [id number] Date of Birth/Sex: Treating RN: 03-29-1955 (65 y.o. Female) Rhae Hammock Primary Care Provider: [name] Other Clinician: Referring Provider: Treating Provider/Extender: Candis Schatz Weeks in Treatment: 0 Constitutional respirations regular, non-labored and within target range for patient.. Cardiovascular 2+ dorsalis pedis/posterior tibialis pulses. Psychiatric pleasant and cooperative. Notes Punched-out wound with granulation tissue and nonviable tissue present in the wound bed. No surrounding signs of infection. Electronic Signature(s) Signed: 12/17/2020 6:39:23 PM By: Kalman Shan DO Entered By: Kalman Shan on 12/17/2020 18:28:59 -------------------------------------------------------------------------------- Physician Orders Details Patient Name: Date of Service: [name], [name] 12/17/2020 7:30 A M Medical Record Number: [id number] Patient Account Number: [id number] Date of Birth/Sex: Treating RN: [birth year] (65 y.o. Female) Rhae Hammock Primary Care Provider: [name] Other Clinician: Referring Provider: Treating Provider/Extender: Daiva Eves in Treatment: 0 Verbal / Phone Orders: No Diagnosis Coding Follow-up Appointments ppointment in 1 week. - on Monday with Dr. Heber Donley Return A Cellular or Tissue Based Products Cellular or Tissue Based Product Type: - RUN IVR FOR PURAPLY Bathing/ Shower/ Hygiene May shower with protection but do not get wound dressing(s) wet. Edema Control - Lymphedema / SCD / Other Elevate legs to the level of the heart or above for 30 minutes daily and/or when sitting, a frequency of: Avoid standing for  long periods of time. Off-Loading Open toe surgical shoe to: - RIGHT FOOT; Wound Treatment Wound #1 - Metatarsal head first Wound Laterality: Right Cleanser: Soap and Water 2 x Per Week Discharge Instructions: May shower and wash wound with dial antibacterial soap and water prior to dressing change. Cleanser: Wound Cleanser 2 x Per Week Discharge Instructions: Cleanse the wound with wound cleanser prior to applying a clean dressing using gauze sponges, not tissue or cotton balls. Prim Dressing: KerraCel Ag Gelling Fiber Dressing, 4x5 in (silver alginate) 2 x Per Week  ary Discharge Instructions: Apply silver alginate to wound bed as instructed Secondary Dressing: ABD Pad, 5x9 2 x Per Week Discharge Instructions: Apply over primary dressing as directed. Secondary Dressing: Optifoam Non-Adhesive Dressing, 4x4 in 2 x Per Week Discharge Instructions: Apply over primary dressing as directed. Secured With: soft cast 2 x Per Week Electronic Signature(s) Signed: 12/17/2020 6:39:23 PM By: Kalman Shan DO Entered By: Kalman Shan on 12/17/2020 18:29:13 -------------------------------------------------------------------------------- Problem List Details Patient Name: Date of Service: [name], [name] 12/17/2020 7:30 A M Medical Record Number: [id number] Patient Account Number: [id number] Date of Birth/Sex: Treating RN: [birth year] (65 y.o. Female) Rhae Hammock Primary Care Provider: [name] Other Clinician: Referring Provider: Treating Provider/Extender: Candis Schatz Weeks in Treatment: 0 Active Problems ICD-10 Encounter Code Description Active Date MDM Diagnosis E11.621 Type 2 diabetes mellitus with foot ulcer 12/17/2020 No Yes E11.42 Type 2 diabetes mellitus with diabetic polyneuropathy 12/17/2020 No Yes F17.290 Nicotine dependence, other tobacco product, uncomplicated 24/40/1027 No Yes Inactive Problems Resolved Problems Electronic  Signature(s) Signed: 12/17/2020 6:39:23 PM By: Kalman Shan DO Entered By: Kalman Shan on 12/17/2020 18:27:39 -------------------------------------------------------------------------------- Progress Note Details Patient Name: Date of Service: [name], [name] 12/17/2020 7:30 A M Medical Record Number: [id number] Patient Account Number: [id number] Date of Birth/Sex: Treating RN: [birth year] (65 y.o. Female) Rhae Hammock Primary Care Provider: [name] Other Clinician: Referring Provider: Treating Provider/Extender: Daiva Eves in Treatment: 0 Subjective Chief Complaint Information obtained from Patient Right Plantar foot wound History of Present Illness (HPI) Admission 12/17/2020 [name] a 65 year old female with a past medical history of insulin-dependent type 2 diabetes, hypothyroidism and daily1 pack per day cigarette smoker the presents to the clinic for a 6-week history of nonhealing wound to the right first MTPJ. She has been following with [name], podiatry for this issue. She has been using silver alginate with dressing changes. She uses a postsurgical shoe and offloading pads. She currently denies signs of infection. Patient History Information obtained from Patient, Chart. Allergies No Known Allergies Family History Unknown History. Social History Current every day smoker - 1 pack/a/day, Alcohol Use - Never, Drug Use - No History, Caffeine Use - Moderate. Medical History Eyes Denies history of Cataracts, Glaucoma, Optic Neuritis Ear/Nose/Mouth/Throat Denies history of Chronic sinus problems/congestion, Middle ear problems Hematologic/Lymphatic Denies history of Anemia, Hemophilia, Human Immunodeficiency Virus, Lymphedema, Sickle Cell Disease Respiratory Patient has history of Sleep Apnea Denies history of Aspiration, Asthma, Chronic Obstructive Pulmonary Disease (COPD), Pneumothorax,  Tuberculosis Cardiovascular Patient has history of Coronary Artery Disease, Hypertension Denies history of Angina, Arrhythmia, Congestive Heart Failure, Deep Vein Thrombosis, Hypotension, Myocardial Infarction, Peripheral Arterial Disease, Peripheral Venous Disease, Phlebitis, Vasculitis Gastrointestinal Denies history of Cirrhosis , Colitis, Crohnoos, Hepatitis A, Hepatitis B, Hepatitis C Endocrine Patient has history of Type II Diabetes Denies history of Type I Diabetes Genitourinary Denies history of End Stage Renal Disease Immunological Denies history of Lupus Erythematosus, Raynaudoos, Scleroderma Integumentary (Skin) Denies history of History of Burn Musculoskeletal Denies history of Gout, Rheumatoid Arthritis, Osteoarthritis, Osteomyelitis Neurologic Patient has history of Neuropathy Denies history of Dementia, Quadriplegia, Paraplegia, Seizure Disorder Hospitalization/Surgery History - 2nd and 3rd right toe amps. - total hysterectomy. - cholecystectomy. - 2 knee surgery's on left and 1 on right. Medical A Surgical History Notes nd Cardiovascular hypercholesterolemia Review of Systems (ROS) Constitutional Symptoms (General Health) Denies complaints or symptoms of Fatigue, Fever, Chills, Marked Weight Change. Eyes Complains or has symptoms of Glasses /  Contacts. Denies complaints or symptoms of Dry Eyes, Vision Changes. Ear/Nose/Mouth/Throat Denies complaints or symptoms of Chronic sinus problems or rhinitis. Respiratory Denies complaints or symptoms of Chronic or frequent coughs, Shortness of Breath. Cardiovascular Denies complaints or symptoms of Chest pain. Gastrointestinal Denies complaints or symptoms of Frequent diarrhea, Nausea, Vomiting. Endocrine Denies complaints or symptoms of Heat/cold intolerance. Genitourinary Denies complaints or symptoms of Frequent urination. Musculoskeletal Denies complaints or symptoms of Muscle Pain, Muscle  Weakness. Neurologic Denies complaints or symptoms of Numbness/parasthesias. Psychiatric Denies complaints or symptoms of Claustrophobia, Suicidal. Objective Constitutional respirations regular, non-labored and within target range for patient.. Vitals Time Taken: 7:57 AM, Height: 67 in, Source: Stated, Weight: 277 lbs, Source: Stated, BMI: 43.4, Temperature: 98.7 F, Pulse: 74 bpm, Respiratory Rate: 17 breaths/min, Blood Pressure: 153/74 mmHg, Capillary Blood Glucose: 139 mg/dl. Cardiovascular 2+ dorsalis pedis/posterior tibialis pulses. Psychiatric pleasant and cooperative. General Notes: Punched-out wound with granulation tissue and nonviable tissue present in the wound bed. No surrounding signs of infection. Integumentary (Hair, Skin) Wound #1 status is Open. Original cause of wound was Gradually Appeared. The date acquired was: 10/29/2020. The wound is located on the Right Metatarsal head first. The wound measures 0.9cm length x 0.6cm width x 0.1cm depth; 0.424cm^2 area and 0.042cm^3 volume. There is no tunneling noted, however, there is undermining starting at 7:00 and ending at 10:00 with a maximum distance of 0.2cm. There is a large amount of serosanguineous drainage noted. The wound margin is distinct with the outline attached to the wound base. There is large (67-100%) red, pink granulation within the wound bed. Assessment Active Problems ICD-10 Type 2 diabetes mellitus with foot ulcer Type 2 diabetes mellitus with diabetic polyneuropathy Nicotine dependence, other tobacco product, uncomplicated Patient presents with a 6-week history of nonhealing wound to the right first MTPJ. We had a long discussion about the importance of glycemic control in wound healing. She reports that her hemoglobin A1c is 6.5 however I cannot see this in the EMR. We also discussed the importance of smoking cessation and its role in wound healing. She had an x-ray done on 11/10/2020 that showed no evidence  of osteolytic erosion or cortical irregularity. She is is having a hard time with offloading to the area. We discussed using a Pegasys insole versus a total contact cast versus a soft cast. We have decided on the total contact cast however I would like to place this next week and not go 4 days without having this changed for her initial cast placement. For now we will do a soft cast and then she will follow-up next week and we will start the total contact cast. We will continue with silver alginate for now. We will also run through her insurance to see if she qualifies for PuraPly and Apligraf. I think she would benefit from this. I debrided nonviable tissue. No signs of infection on exam. 48 minutes was spent on the encounter including face-to-face, EMR review and coordination of care Plan Follow-up Appointments: Return Appointment in 1 week. - on Monday with Dr. Heber Parker Cellular or Tissue Based Products: Cellular or Tissue Based Product Type: - RUN IVR FOR PURAPLY Bathing/ Shower/ Hygiene: May shower with protection but do not get wound dressing(s) wet. Edema Control - Lymphedema / SCD / Other: Elevate legs to the level of the heart or above for 30 minutes daily and/or when sitting, a frequency of: Avoid standing for long periods of time. Off-Loading: Open toe surgical shoe to: - RIGHT FOOT ; WOUND #1: -  Metatarsal head first Wound Laterality: Right Cleanser: Soap and Water 2 x Per Week/ Discharge Instructions: May shower and wash wound with dial antibacterial soap and water prior to dressing change. Cleanser: Wound Cleanser 2 x Per Week/ Discharge Instructions: Cleanse the wound with wound cleanser prior to applying a clean dressing using gauze sponges, not tissue or cotton balls. Prim Dressing: KerraCel Ag Gelling Fiber Dressing, 4x5 in (silver alginate) 2 x Per Week/ ary Discharge Instructions: Apply silver alginate to wound bed as instructed Secondary Dressing: ABD Pad, 5x9 2 x Per  Week/ Discharge Instructions: Apply over primary dressing as directed. Secondary Dressing: Optifoam Non-Adhesive Dressing, 4x4 in 2 x Per Week/ Discharge Instructions: Apply over primary dressing as directed. Secured With: soft cast 2 x Per Week/ 1. In office sharp debridement 2. Soft contact cast 3. Run IVR for PuraPly 4. Follow-up early next week for cast placement Electronic Signature(s) Signed: 12/17/2020 6:39:23 PM By: Kalman Shan DO Entered By: Kalman Shan on 12/17/2020 18:38:48 -------------------------------------------------------------------------------- HxROS Details Patient Name: Date of Service: [name], [name] 12/17/2020 7:30 A M Medical Record Number: [id number] Patient Account Number: [id number] Date of Birth/Sex: Treating RN: August 04, 1955 (65 y.o. Female) Rhae Hammock Primary Care Provider: [name] Other Clinician: Referring Provider: Treating Provider/Extender: Daiva Eves in Treatment: 0 Information Obtained From Patient Chart Constitutional Symptoms (General Health) Complaints and Symptoms: Negative for: Fatigue; Fever; Chills; Marked Weight Change Eyes Complaints and Symptoms: Positive for: Glasses / Contacts Negative for: Dry Eyes; Vision Changes Medical History: Negative for: Cataracts; Glaucoma; Optic Neuritis Ear/Nose/Mouth/Throat Complaints and Symptoms: Negative for: Chronic sinus problems or rhinitis Medical History: Negative for: Chronic sinus problems/congestion; Middle ear problems Respiratory Complaints and Symptoms: Negative for: Chronic or frequent coughs; Shortness of Breath Medical History: Positive for: Sleep Apnea Negative for: Aspiration; Asthma; Chronic Obstructive Pulmonary Disease (COPD); Pneumothorax; Tuberculosis Cardiovascular Complaints and Symptoms: Negative for: Chest pain Medical History: Positive for: Coronary Artery Disease; Hypertension Negative for: Angina;  Arrhythmia; Congestive Heart Failure; Deep Vein Thrombosis; Hypotension; Myocardial Infarction; Peripheral Arterial Disease; Peripheral Venous Disease; Phlebitis; Vasculitis Past Medical History Notes: hypercholesterolemia Gastrointestinal Complaints and Symptoms: Negative for: Frequent diarrhea; Nausea; Vomiting Medical History: Negative for: Cirrhosis ; Colitis; Crohns; Hepatitis A; Hepatitis B; Hepatitis C Endocrine Complaints and Symptoms: Negative for: Heat/cold intolerance Medical History: Positive for: Type II Diabetes Negative for: Type I Diabetes Genitourinary Complaints and Symptoms: Negative for: Frequent urination Medical History: Negative for: End Stage Renal Disease Musculoskeletal Complaints and Symptoms: Negative for: Muscle Pain; Muscle Weakness Medical History: Negative for: Gout; Rheumatoid Arthritis; Osteoarthritis; Osteomyelitis Neurologic Complaints and Symptoms: Negative for: Numbness/parasthesias Medical History: Positive for: Neuropathy Negative for: Dementia; Quadriplegia; Paraplegia; Seizure Disorder Psychiatric Complaints and Symptoms: Negative for: Claustrophobia; Suicidal Hematologic/Lymphatic Medical History: Negative for: Anemia; Hemophilia; Human Immunodeficiency Virus; Lymphedema; Sickle Cell Disease Immunological Medical History: Negative for: Lupus Erythematosus; Raynauds; Scleroderma Integumentary (Skin) Medical History: Negative for: History of Burn Oncologic Immunizations Pneumococcal Vaccine: Received Pneumococcal Vaccination: Yes Received Pneumococcal Vaccination On or After 60th Birthday: Yes Tetanus Vaccine: Last tetanus shot: 12/17/2020 Implantable Devices No devices added Hospitalization / Surgery History Type of Hospitalization/Surgery 2nd and 3rd right toe amps total hysterectomy cholecystectomy 2 knee surgery's on left and 1 on right Family and Social History Unknown History: Yes; Current every day smoker - 1  pack/a/day; Alcohol Use: Never; Drug Use: No History; Caffeine Use: Moderate; Financial Concerns: No; Food, Clothing or Shelter Needs: No; Support System Lacking: No; Transportation Concerns: No Electronic Signature(s) Signed: 12/17/2020  6:39:23 PM By: Kalman Shan DO Signed: 12/18/2020 12:25:45 PM By: Rhae Hammock RN Entered By: Rhae Hammock on 12/17/2020 08:12:52 -------------------------------------------------------------------------------- SuperBill Details Patient Name: Date of Service: [name], [name] 12/17/2020 Medical Record Number: [id number] Patient Account Number: [id number] Date of Birth/Sex: Treating RN: 01/13/1956 (64 y.o. Female) Rhae Hammock Primary Care Provider: [name] Other Clinician: Referring Provider: Treating Provider/Extender: Daiva Eves in Treatment: 0 Diagnosis Coding ICD-10 Codes Code Description [phone] Type 2 diabetes mellitus with foot ulcer E11.42 Type 2 diabetes mellitus with diabetic polyneuropathy F17.290 Nicotine dependence, other tobacco product, uncomplicated Facility Procedures CPT4 Code: [id number] Description: 69678 - WOUND CARE VISIT-LEV 3 EST PT Modifier: Quantity: 1 Physician Procedures : CPT4 Code Description Modifier [id number] - WC PHYS LEVEL 4 - NEW PT ICD-10 Diagnosis Description E11.621 Type 2 diabetes mellitus with foot ulcer E11.42 Type 2 diabetes mellitus with diabetic polyneuropathy F17.290 Nicotine dependence, other  tobacco product, uncomplicated Quantity: 1 Electronic Signature(s) Signed: 12/18/2020 11:53:18 AM By: Kalman Shan DO Signed: 12/18/2020 12:25:45 PM By: Rhae Hammock RN Previous Signature: 12/17/2020 6:39:23 PM Version By: Kalman Shan DO Entered By: Rhae Hammock on 12/18/2020 11:25:00

## 2020-12-18 NOTE — Progress Notes (Signed)
SAREA, FYFE ([id number]) Visit Report for 12/17/2020 Abuse/Suicide Risk Screen Details Patient Name: Date of Service: [name], [name] 12/17/2020 7:30 [name] Medical Record Number: [id number] Patient Account Number: [id number] Date of Birth/Sex: Treating RN: 19-Jan-1956 (65 y.o. Female) Rhae Hammock Primary Care Lenville Hibberd: [name] Other Clinician: Referring Amit Leece: Treating Yatzary Merriweather/Extender: Daiva Eves in Treatment: 0 Abuse/Suicide Risk Screen Items Answer ABUSE RISK SCREEN: Has anyone close to you tried to hurt or harm you recentlyo No Do you feel uncomfortable with anyone in your familyo No Has anyone forced you do things that you didnt want to doo No Electronic Signature(s) Signed: 12/18/2020 12:25:45 PM By: Rhae Hammock RN Entered By: Rhae Hammock on 12/17/2020 08:01:16 -------------------------------------------------------------------------------- Activities of Daily Living Details Patient Name: Date of Service: [name] [name] May [name] 12/17/2020 7:30 [name] Medical Record Number: [id number] Patient Account Number: [id number] Date of Birth/Sex: Treating RN: [birth year] (65 y.o. Female) Rhae Hammock Primary Care Tameya Kuznia: [name] Other Clinician: Referring Staysha Truby: Treating Mattea Seger/Extender: Daiva Eves in Treatment: 0 Activities of Daily Living Items Answer Activities of Daily Living (Please select one for each item) Drive Automobile Completely Able T Medications ake Completely Able Use T elephone Completely Able Care for Appearance Completely Able Use T oilet Completely Able Bath / Shower Completely Able Dress Self Completely Able Feed Self Completely Able Walk Completely Able Get In / Out Bed Completely Able Housework Completely Able Prepare Meals Completely [place] for Self Completely Able Electronic Signature(s) Signed:  12/18/2020 12:25:45 PM By: Rhae Hammock RN Entered By: Rhae Hammock on 12/17/2020 08:01:40 -------------------------------------------------------------------------------- Education Screening Details Patient Name: Date of Service: [name], [name] 12/17/2020 7:30 [name] Medical Record Number: [id number] Patient Account Number: [id number] Date of Birth/Sex: Treating RN: [birth year] (65 y.o. Female) Rhae Hammock Primary Care Naeem Quillin: [name] Other Clinician: Referring Andersson Larrabee: Treating Ahmoni Edge/Extender: Daiva Eves in Treatment: 0 Primary Learner Assessed: Patient Learning Preferences/Education Level/Primary Language Learning Preference: Explanation, Demonstration, Communication Board, Printed Material Highest Education Level: College or Above Preferred Language: English Cognitive Barrier Language Barrier: No Translator Needed: No Memory Deficit: No Emotional Barrier: No Cultural/Religious Beliefs Affecting Medical Care: No Physical Barrier Impaired Vision: Yes Glasses Impaired Hearing: No Decreased Hand dexterity: No Knowledge/Comprehension Knowledge Level: High Comprehension Level: High Ability to understand written instructions: High Ability to understand verbal instructions: High Motivation Anxiety Level: Calm Cooperation: Cooperative Education Importance: Denies Need Interest in Health Problems: Asks Questions Perception: Coherent Willingness to Engage in Self-Management High Activities: Readiness to Engage in Self-Management High Activities: Electronic Signature(s) Signed: 12/18/2020 12:25:45 PM By: Rhae Hammock RN Entered By: Rhae Hammock on 12/17/2020 08:02:43 -------------------------------------------------------------------------------- Fall Risk Assessment Details Patient Name: Date of Service: [name], [name] 12/17/2020 7:30 [name] Medical Record Number: [id number] Patient Account  Number: [id number] Date of Birth/Sex: Treating RN: 1955/09/04 (65 y.o. Female) Rhae Hammock Primary Care Shantanu Strauch: [name] Other Clinician: Referring Jaleel Allen: Treating Talyn Dessert/Extender: Daiva Eves in Treatment: 0 Fall Risk Assessment Items Have you had 2 or more falls in the last 12 monthso 0 Yes Have you had any fall that resulted in injury in the last 12 monthso 0 Yes FALLS RISK SCREEN History of falling - immediate or within 3 months 0 No Secondary diagnosis (Do you have 2 or more medical diagnoseso) 0 No Ambulatory aid None/bed rest/wheelchair/nurse 0 No Crutches/cane/walker 0 No Furniture 0 No  Intravenous therapy Access/Saline/Heparin Lock 0 No Gait/Transferring Normal/ bed rest/ wheelchair 0 No Weak (short steps with or without shuffle, stooped but able to lift head while walking, may seek 0 No support from furniture) Impaired (short steps with shuffle, may have difficulty arising from chair, head down, impaired 0 No balance) Mental Status Oriented to own ability 0 No Electronic Signature(s) Signed: 12/18/2020 12:25:45 PM By: Rhae Hammock RN Entered By: Rhae Hammock on 12/17/2020 08:03:33 -------------------------------------------------------------------------------- Foot Assessment Details Patient Name: Date of Service: [name], [name] 12/17/2020 7:30 [name] Medical Record Number: [id number] Patient Account Number: [id number] Date of Birth/Sex: Treating RN: 10-14-1955 (65 y.o. Female) Rhae Hammock Primary Care Lala Been: [name] Other Clinician: Referring Janthony Holleman: Treating Ailish Prospero/Extender: Daiva Eves in Treatment: 0 Foot Assessment Items Site Locations + = Sensation present, - = Sensation absent, C = Callus, U = Ulcer R = Redness, W = Warmth, [name] = Maceration, PU = Pre-ulcerative lesion F = Fissure, S = Swelling, D = Dryness Assessment Right: Left: Other  Deformity: No No Prior Foot Ulcer: Yes No Prior Amputation: Yes No Charcot Joint: Yes No Ambulatory Status: Ambulatory Without Help Gait: Steady Electronic Signature(s) Signed: 12/18/2020 12:25:45 PM By: Rhae Hammock RN Entered By: Rhae Hammock on 12/17/2020 09:09:36 -------------------------------------------------------------------------------- Nutrition Risk Screening Details Patient Name: Date of Service: [name], [name] 12/17/2020 7:30 [name] Medical Record Number: [id number] Patient Account Number: [id number] Date of Birth/Sex: Treating RN: 10/02/1955 (65 y.o. Female) Rhae Hammock Primary Care Jahmari Esbenshade: [name] Other Clinician: Referring Sevana Grandinetti: Treating Kelsei Defino/Extender: Candis Schatz Weeks in Treatment: 0 Height (in): 67 Weight (lbs): 277 Body Mass Index (BMI): 43.4 Nutrition Risk Screening Items Score Screening NUTRITION RISK SCREEN: I have an illness or condition that made me change the kind and/or amount of food I eat 0 No I eat fewer than two meals per day 0 No I eat few fruits and vegetables, or milk products 0 No I have three or more drinks of beer, liquor or wine almost every day 0 No I have tooth or mouth problems that make it hard for me to eat 0 No I don't always have enough money to buy the food I need 0 No I eat alone most of the time 0 No I take three or more different prescribed or over-the-counter drugs [name] day 0 No Without wanting to, I have lost or gained 10 pounds in the last six months 0 No I am not always physically able to shop, cook and/or feed myself 0 No Nutrition Protocols Good Risk Protocol 0 No interventions needed Moderate Risk Protocol High Risk Proctocol Risk Level: Good Risk Score: 0 Electronic Signature(s) Signed: 12/18/2020 12:25:45 PM By: Rhae Hammock RN Entered By: Rhae Hammock on 12/17/2020 08:03:40

## 2020-12-18 NOTE — Progress Notes (Signed)
[name] [name] ([id number]) Visit Report for 12/17/2020 Allergy List Details Patient Name: Date of Service: [name] [name], [name] 12/17/2020 7:[name] [name] Medical Record Number: [id number] Patient Account Number: [id number] Date of Birth/Sex: Treating [name]: [birth year] (65 y.o. Female) [name] [name] Primary Care [name] [name]: [name] [name] Other Clinician: Referring [name] [name]: Treating [name] [name]/Extender: [name] [name] Weeks in Treatment: 0 Allergies Active Allergies No Known Allergies Allergy Notes Electronic Signature(s) Signed: 12/18/2020 12:25:45 PM By: [name] [name] Entered By: [name] [name] on 12/17/2020 [name]:00:52 -------------------------------------------------------------------------------- Arrival Information Details Patient Name: Date of Service: [name] [name], [name] 12/17/2020 7:[name] [name] Medical Record Number: [id number] Patient Account Number: [id number] Date of Birth/Sex: Treating [name]: [name] [name], [name] [name] (64 y.o. Female) [name] [name] Primary Care [name] [name]: [name] [name] Other Clinician: Referring [name] [name]: Treating [name] [name]: [name] [name] in Treatment: 0 Visit Information Patient Arrived: Ambulatory Arrival Time: 07:56 Accompanied By: self Transfer Assistance: None Patient Identification Verified: Yes Secondary Verification Process Completed: Yes Patient Requires Transmission-Based Precautions: No Patient Has Alerts: No Electronic Signature(s) Signed: 12/18/2020 12:25:45 PM By: [name] [name] Entered By: [name] [name] on 12/17/2020 07:56:51 -------------------------------------------------------------------------------- Clinic Level of Care Assessment Details Patient Name: Date of Service: [name] [name], [name] 12/17/2020 7:[name] [name] Medical Record Number: [id number] Patient Account Number: [id number] Date of Birth/Sex: Treating [name]: [name] [name] (65 y.o. Female) [name] [name] Primary  Care [name] [name]: [name] [name] Other Clinician: Referring [name] [name]: Treating [name] [name]: [name] [name] in Treatment: 0 Clinic Level of Care Assessment Items TOOL 4 Quantity Score X- 1 0 Use when only an EandM is performed on FOLLOW-UP visit ASSESSMENTS - Nursing Assessment / Reassessment X- 1 10 Reassessment of Co-morbidities (includes updates in patient status) X- 1 5 Reassessment of Adherence to Treatment Plan ASSESSMENTS - Wound and Skin [name] ssessment / Reassessment X - Simple Wound Assessment / Reassessment - one wound 1 5 []  - 0 Complex Wound Assessment / Reassessment - multiple wounds []  - 0 Dermatologic / Skin Assessment (not related to wound area) ASSESSMENTS - Focused Assessment X- 1 5 Circumferential Edema Measurements - multi extremities []  - 0 Nutritional Assessment / Counseling / Intervention []  - 0 Lower Extremity Assessment (monofilament, tuning fork, pulses) []  - 0 Peripheral Arterial Disease Assessment (using hand held doppler) ASSESSMENTS - Ostomy and/or Continence Assessment and Care []  - 0 Incontinence Assessment and Management []  - 0 Ostomy Care Assessment and Management (repouching, etc.) PROCESS - Coordination of Care X - Simple Patient / Family Education for ongoing care 1 15 []  - 0 Complex (extensive) Patient / Family Education for ongoing care X- 1 10 Staff obtains Programmer, systems, Records, T Results / Process Orders est []  - 0 Staff telephones HHA, Nursing Homes / Clarify orders / etc []  - 0 Routine Transfer to another Facility (non-emergent condition) []  - 0 Routine Hospital Admission (non-emergent condition) []  - 0 New Admissions / Biomedical engineer / Ordering NPWT Apligraf, etc. , []  - 0 Emergency Hospital Admission (emergent condition) X- 1 10 Simple Discharge Coordination []  - 0 Complex (extensive) Discharge Coordination PROCESS - Special Needs []  - 0 Pediatric / Minor Patient Management []  -  0 Isolation Patient Management []  - 0 Hearing / Language / Visual special needs []  - 0 Assessment of Community assistance (transportation, D/C planning, etc.) []  - 0 Additional assistance / Altered mentation []  - 0 Support Surface(s) Assessment (bed, cushion, seat, etc.) INTERVENTIONS - Wound Cleansing / Measurement X - Simple Wound Cleansing - one wound  1 5 []  - 0 Complex Wound Cleansing - multiple wounds X- 1 5 Wound Imaging (photographs - any number of wounds) []  - 0 Wound Tracing (instead of photographs) X- 1 5 Simple Wound Measurement - one wound []  - 0 Complex Wound Measurement - multiple wounds INTERVENTIONS - Wound Dressings X - Small Wound Dressing one or multiple wounds 1 10 []  - 0 Medium Wound Dressing one or multiple wounds []  - 0 Large Wound Dressing one or multiple wounds []  - 0 Application of Medications - topical []  - 0 Application of Medications - injection INTERVENTIONS - Miscellaneous []  - 0 External ear exam []  - 0 Specimen Collection (cultures, biopsies, blood, body fluids, etc.) []  - 0 Specimen(s) / Culture(s) sent or taken to Lab for analysis []  - 0 Patient Transfer (multiple staff / Civil Service fast streamer / Similar devices) []  - 0 Simple Staple / Suture removal (25 or less) []  - 0 Complex Staple / Suture removal (26 or more) []  - 0 Hypo / Hyperglycemic Management (close monitor of Blood Glucose) []  - 0 Ankle / Brachial Index (ABI) - do not check if billed separately X- 1 5 Vital Signs Has the patient been seen at the hospital within the last three years: Yes Total Score: 90 Level Of Care: New/Established - Level 3 Electronic Signature(s) Signed: 12/18/2020 12:25:45 PM By: [name] [name] Entered By: [name] [name] on 12/18/2020 11:24:52 -------------------------------------------------------------------------------- Encounter Discharge Information Details Patient Name: Date of Service: [name] [name], [name] 12/17/2020 7:[name] [name] Medical  Record Number: [id number] Patient Account Number: [id number] Date of Birth/Sex: Treating [name]: [name] [name] (65 y.o. Female) [name] [name] Primary Care [name] [name]: [name] [name] Other Clinician: Referring Tiona Ruane: Treating Xochitl Egle/Extender: [name] [name] in Treatment: 0 Encounter Discharge Information Items Discharge Condition: Stable Ambulatory Status: Ambulatory Discharge Destination: Home Transportation: Private Auto Schedule Follow-up Appointment: Yes Clinical Summary of Care: Provided on 12/17/2020 Form Type Recipient Paper Patient Patient Electronic Signature(s) Signed: 12/17/2020 12:39:53 PM By: [name] [name] Entered By: [name] [name] on 12/17/2020 12:39:53 -------------------------------------------------------------------------------- Lower Extremity Assessment Details Patient Name: Date of Service: [name] [name], [name] 12/17/2020 7:[name] [name] Medical Record Number: [id number] Patient Account Number: [id number] Date of Birth/Sex: Treating [name]: [name] [name] (65 y.o. Female) [name] [name] Primary Care Raylinn Kosar: [name] [name] Other Clinician: Referring Shaylynne Lunt: Treating Kirtis Challis/Extender: [name] [name] Weeks in Treatment: 0 Edema Assessment Assessed: [Left: No] [Right: Yes] Edema: [Left: Ye] [Right: s] Calf Left: Right: Point of Measurement: From Medial Instep 46 cm Ankle Left: Right: Point of Measurement: From Medial Instep 29 cm Vascular Assessment Pulses: Dorsalis Pedis Palpable: [Right:Yes] Posterior Tibial Palpable: [Right:Yes] Blood Pressure: Brachial: [Right:154] Ankle: [Right:Dorsalis Pedis: 143 0.93] Electronic Signature(s) Signed: 12/18/2020 12:25:45 PM By: [name] [name] Entered By: [name] [name] on 12/17/2020 09:10:14 -------------------------------------------------------------------------------- Multi Wound Chart Details Patient Name: Date of Service: [name] [name], [name]  12/17/2020 7:[name] [name] Medical Record Number: [id number] Patient Account Number: [id number] Date of Birth/Sex: Treating [name]: [name] [name] (65 y.o. Female) [name] [name] Primary Care Vaniya Augspurger: [name] [name] Other Clinician: Referring Lani Havlik: Treating Jahnae Mcadoo/Extender: [name] [name] in Treatment: 0 Vital Signs Height(in): 97 Capillary Blood Glucose(mg/dl): 139 Weight(lbs): 277 Pulse(bpm): 55 Body Mass Index(BMI): 76 Blood Pressure(mmHg): 153/74 Temperature(F): 98.7 Respiratory Rate(breaths/min): 17 Photos: [1:Right Metatarsal head first] [N/[name]:N/[name] N/[name]] Wound Location: [1:Gradually Appeared] [N/[name]:N/[name]] Wounding Event: [1:T be determined o] [N/[name]:N/[name]] Primary Etiology: [1:Sleep Apnea, Coronary Artery] [N/[name]:N/[name]] Comorbid History: [1:Disease, Hypertension, Type II Diabetes, Neuropathy 10/29/2020] [N/[name]:N/[name]] Date  Acquired: [1:0] [N/[name]:N/[name]] Weeks of Treatment: [1:Open] [N/[name]:N/[name]] Wound Status: [1:0.9x0.6x0.1] [N/[name]:N/[name]] Measurements L x W x D (cm) [1:0.424] [N/[name]:N/[name]] [name] (cm) : rea [1:0.042] [N/[name]:N/[name]] Volume (cm) : [1:0.00%] [N/[name]:N/[name]] % Reduction in [name] rea: [1:0.00%] [N/[name]:N/[name]] % Reduction in Volume: [1:7] Starting Position 1 (o'clock): [1:10] Ending Position 1 (o'clock): [1:0.2] Maximum Distance 1 (cm): [1:Yes] [N/[name]:N/[name]] Undermining: [1:Partial Thickness] [N/[name]:N/[name]] Classification: [1:Large] [N/[name]:N/[name]] Exudate [name] mount: [1:Serosanguineous] [N/[name]:N/[name]] Exudate Type: [1:red, brown] [N/[name]:N/[name]] Exudate Color: [1:Distinct, outline attached] [N/[name]:N/[name]] Wound Margin: [1:Large (67-100%)] [N/[name]:N/[name]] Granulation [name] mount: [1:Red, Pink] [N/[name]:N/[name]] Granulation Quality: [1:Fascia: No] [N/[name]:N/[name]] Exposed Structures: [1:Fat Layer (Subcutaneous Tissue): No Tendon: No Muscle: No Joint: No Bone: No] Treatment Notes Wound #1 (Metatarsal head first) Wound Laterality: Right Cleanser Soap and Water Discharge Instruction: May shower and wash wound with dial antibacterial soap and water  prior to dressing change. Wound Cleanser Discharge Instruction: Cleanse the wound with wound cleanser prior to applying [name] clean dressing using gauze sponges, not tissue or cotton balls. Peri-Wound Care Topical Primary Dressing KerraCel Ag Gelling Fiber Dressing, 4x5 in (silver alginate) Discharge Instruction: Apply silver alginate to wound bed as instructed Secondary Dressing ABD Pad, 5x9 Discharge Instruction: Apply over primary dressing as directed. Optifoam Non-Adhesive Dressing, 4x4 in Discharge Instruction: Apply over primary dressing as directed. Secured With soft cast Compression Wrap Compression Stockings Add-Ons Electronic Signature(s) Signed: 12/17/2020 6:39:23 PM By: Kalman Shan DO Signed: 12/18/2020 12:25:45 PM By: [name] [name] Entered By: Kalman Shan on 12/17/2020 18:27:43 -------------------------------------------------------------------------------- Multi-Disciplinary Care Plan Details Patient Name: Date of Service: [name] [name], [name] 12/17/2020 7:[name] [name] Medical Record Number: [id number] Patient Account Number: [id number] Date of Birth/Sex: Treating [name]: [name] [name], [name] [name] (65 y.o. Female) [name] [name] Primary Care Jaeli Grubb: [name] [name] Other Clinician: Referring Arleen Bar: Treating Aaliyana Fredericks/Extender: [name] [name] in Treatment: 0 Active Inactive Nutrition Nursing Diagnoses: Impaired glucose control: actual or potential Goals: Patient/caregiver verbalizes understanding of need to maintain therapeutic glucose control per primary care physician Date Initiated: 12/17/2020 Target Resolution Date: 01/21/2021 Goal Status: Active Interventions: Assess HgA1c results as ordered upon admission and as needed Assess patient nutrition upon admission and as needed per policy Provide education on elevated blood sugars and impact on wound healing Notes: Wound/Skin Impairment Nursing Diagnoses: Impaired tissue  integrity Goals: Patient/caregiver will verbalize understanding of skin care regimen Date Initiated: 12/17/2020 Target Resolution Date: 01/21/2021 Goal Status: Active Ulcer/skin breakdown will have [name] volume reduction of [name]% by week 4 Date Initiated: 12/17/2020 Target Resolution Date: 01/21/2021 Goal Status: Active Interventions: Assess patient/caregiver ability to obtain necessary supplies Assess patient/caregiver ability to perform ulcer/skin care regimen upon admission and as needed Assess ulceration(s) every visit Provide education on ulcer and skin care Treatment Activities: Topical wound management initiated : 12/17/2020 Notes: Electronic Signature(s) Signed: 12/17/2020 12:37:51 PM By: [name] [name] Entered By: [name] [name] on 12/17/2020 12:37:51 -------------------------------------------------------------------------------- Pain Assessment Details Patient Name: Date of Service: [name] [name], [name] 12/17/2020 7:[name] [name] Medical Record Number: [id number] Patient Account Number: [id number] Date of Birth/Sex: Treating [name]: [name]55 (66 y.o. Female) [name] [name] Primary Care Ehren Berisha: Other Clinician: Geoffery [name] Referring Janina Trafton: Treating Maymunah Stegemann/Extender: [name] [name] in Treatment: 0 Active Problems Location of Pain Severity and Description of Pain Patient Has Paino No Site Locations Pain Management and Medication Current Pain Management: Electronic Signature(s) Signed: 12/18/2020 12:25:45 PM By: [name] [name] Entered By: [name] [name] on 12/17/2020 [name]:03:53 -------------------------------------------------------------------------------- Patient/Caregiver Education Details Patient Name: Date of Service: [name] [name], [name]  K. 10/27/2022andnbsp7:[name] [name] Medical Record Number: [id number] Patient Account Number: [id number] Date of Birth/Gender: Treating [name]: [name] [name] (65 y.o. Female) [name] [name] Primary  Care Physician: [name] [name] Other Clinician: Referring Physician: Treating Physician/Extender: [name] [name] in Treatment: 0 Education Assessment Education Provided To: Patient Education Topics Provided Elevated Blood Sugar/ Impact on Healing: Methods: Explain/Verbal, Printed Responses: State content correctly Offloading: Methods: Demonstration, Explain/Verbal, Printed Responses: State content correctly Wound/Skin Impairment: Methods: Explain/Verbal, Printed Responses: State content correctly Electronic Signature(s) Signed: 12/17/2020 4:35:44 PM By: [name] [name] Entered By: [name] [name] on 12/17/2020 12:38:19 -------------------------------------------------------------------------------- Wound Assessment Details Patient Name: Date of Service: [name] [name], [name] 12/17/2020 7:[name] [name] Medical Record Number: [id number] Patient Account Number: [id number] Date of Birth/Sex: Treating [name]: [name] [name][birth year] (65 y.o. Female) [name] [name] Primary Care Niasha Devins: [name] [name] Other Clinician: Referring Taeshawn Helfman: Treating Mehreen Azizi/Extender: [name] [name] Weeks in Treatment: 0 Wound Status Wound Number: 1 Primary T be determined o Etiology: Wound Location: Right Metatarsal head first Wound Open Wounding Event: Gradually Appeared Status: Date Acquired: 10/29/2020 Comorbid Sleep Apnea, Coronary Artery Disease, Hypertension, Type II Weeks Of Treatment: 0 History: Diabetes, Neuropathy Clustered Wound: No Photos Wound Measurements Length: (cm) 0.9 Width: (cm) 0.6 Depth: (cm) 0.1 Area: (cm) 0.424 Volume: (cm) 0.042 % Reduction in Area: 0% % Reduction in Volume: 0% Tunneling: No Undermining: Yes Starting Position (o'clock): 7 Ending Position (o'clock): 10 Maximum Distance: (cm) 0.2 Wound Description Classification: Partial Thickness Wound Margin: Distinct, outline attached Exudate Amount: Large Exudate Type:  Serosanguineous Exudate Color: red, brown Foul Odor After Cleansing: No Slough/Fibrino Yes Wound Bed Granulation Amount: Large (67-100%) Exposed Structure Granulation Quality: Red, Pink Fascia Exposed: No Fat Layer (Subcutaneous Tissue) Exposed: No Tendon Exposed: No Muscle Exposed: No Joint Exposed: No Bone Exposed: No Treatment Notes Wound #1 (Metatarsal head first) Wound Laterality: Right Cleanser Soap and Water Discharge Instruction: May shower and wash wound with dial antibacterial soap and water prior to dressing change. Wound Cleanser Discharge Instruction: Cleanse the wound with wound cleanser prior to applying [name] clean dressing using gauze sponges, not tissue or cotton balls. Peri-Wound Care Topical Primary Dressing KerraCel Ag Gelling Fiber Dressing, 4x5 in (silver alginate) Discharge Instruction: Apply silver alginate to wound bed as instructed Secondary Dressing ABD Pad, 5x9 Discharge Instruction: Apply over primary dressing as directed. Optifoam Non-Adhesive Dressing, 4x4 in Discharge Instruction: Apply over primary dressing as directed. Secured With soft cast Compression Wrap Compression Stockings Add-Ons Electronic Signature(s) Signed: 12/17/2020 4:35:44 PM By: [name] [name] Signed: 12/18/2020 12:25:45 PM By: [name] [name] Entered By: [name] [name] on 12/17/2020 09:24:37 -------------------------------------------------------------------------------- Vitals Details Patient Name: Date of Service: [name] [name], [name] 12/17/2020 7:[name] [name] Medical Record Number: [id number] Patient Account Number: [id number] Date of Birth/Sex: Treating [name]: [birth year] (65 y.o. Female) [name] [name] Primary Care Druscilla Petsch: [name] [name] Other Clinician: Referring Zula Hovsepian: Treating Caffie Sotto/Extender: [name] [name] in Treatment: 0 Vital Signs Time Taken: 07:57 Temperature (F): 98.7 Height (in): 67 Pulse (bpm): 74 Source:  Stated Respiratory Rate (breaths/min): 17 Weight (lbs): 277 Blood Pressure (mmHg): 153/74 Source: Stated Capillary Blood Glucose (mg/dl): 139 Body Mass Index (BMI): 43.4 Reference Range: 80 - 120 mg / dl Electronic Signature(s) Signed: 12/18/2020 12:25:45 PM By: [name] [name] Entered By: [name] [name] on 12/17/2020 07:58:59

## 2020-12-21 ENCOUNTER — Telehealth: Payer: Self-pay | Admitting: Podiatry

## 2020-12-21 ENCOUNTER — Encounter (HOSPITAL_BASED_OUTPATIENT_CLINIC_OR_DEPARTMENT_OTHER): Payer: Medicare Other | Admitting: Internal Medicine

## 2020-12-21 ENCOUNTER — Other Ambulatory Visit: Payer: Self-pay

## 2020-12-21 ENCOUNTER — Other Ambulatory Visit: Payer: Self-pay | Admitting: Podiatry

## 2020-12-21 DIAGNOSIS — S91301A Unspecified open wound, right foot, initial encounter: Secondary | ICD-10-CM | POA: Diagnosis not present

## 2020-12-21 DIAGNOSIS — F17210 Nicotine dependence, cigarettes, uncomplicated: Secondary | ICD-10-CM | POA: Diagnosis not present

## 2020-12-21 DIAGNOSIS — E08621 Diabetes mellitus due to underlying condition with foot ulcer: Secondary | ICD-10-CM

## 2020-12-21 DIAGNOSIS — E11621 Type 2 diabetes mellitus with foot ulcer: Secondary | ICD-10-CM | POA: Diagnosis not present

## 2020-12-21 DIAGNOSIS — F17290 Nicotine dependence, other tobacco product, uncomplicated: Secondary | ICD-10-CM | POA: Diagnosis not present

## 2020-12-21 DIAGNOSIS — Z794 Long term (current) use of insulin: Secondary | ICD-10-CM | POA: Diagnosis not present

## 2020-12-21 DIAGNOSIS — E1142 Type 2 diabetes mellitus with diabetic polyneuropathy: Secondary | ICD-10-CM | POA: Diagnosis not present

## 2020-12-21 NOTE — Telephone Encounter (Signed)
Pt called office to notify you that she does not like the knee scooter you ordered and she would like to get an order for a regular walker? Thanks

## 2020-12-21 NOTE — Telephone Encounter (Signed)
Order placed.  Could we please arrange this with the DME company that we use?  Thanks, [name]

## 2020-12-22 ENCOUNTER — Telehealth: Payer: Self-pay | Admitting: *Deleted

## 2020-12-22 ENCOUNTER — Other Ambulatory Visit: Payer: Self-pay | Admitting: *Deleted

## 2020-12-22 NOTE — Progress Notes (Signed)
[name], [name] ([id number]) Visit Report for 12/21/2020 Arrival Information Details Patient Name: Date of Service: [name], [name] [name] K. 12/21/2020 8:45 A M Medical Record Number: [id number] Patient Account Number: [id number] Date of Birth/Sex: Treating RN: 07-23-1955 (65 y.o. [name] Primary Care Jivan Symanski: [name] Other Clinician: Referring Shaeleigh Graw: Treating Nayshawn Mesta/Extender: [name] in Treatment: 0 Visit Information History Since Last Visit Added or deleted any medications: No Patient Arrived: Ambulatory Any new allergies or adverse reactions: No Arrival Time: 08:35 Had a fall or experienced change in No Accompanied By: Spouse activities of daily living that may affect Transfer Assistance: None risk of falls: Patient Identification Verified: Yes Signs or symptoms of abuse/neglect since No Secondary Verification Process Completed: Yes last visito Patient Requires Transmission-Based Precautions: No Hospitalized since last visit: No Patient Has Alerts: No Implantable device outside of the clinic No excluding cellular tissue based products placed in the center since last visit: Has Dressing in Place as Prescribed: Yes Has Compression in Place as Prescribed: Yes Has Footwear/Offloading in Place as Yes Prescribed: Right: Surgical Shoe with Pressure Relief Insole Pain Present Now: No Electronic Signature(s) Signed: 12/22/2020 5:06:28 PM By: Dellie Catholic RN Entered By: Dellie Catholic on 12/21/2020 08:37:00 -------------------------------------------------------------------------------- Encounter Discharge Information Details Patient Name: Date of Service: [name], [name] 12/21/2020 8:45 A M Medical Record Number: [id number] Patient Account Number: [id number] Date of Birth/Sex: Treating RN: [birth year] (65 y.o. [name] Primary Care Rosealynn Mateus: [name] Other Clinician: Referring  Kristilyn Coltrane: Treating Paizley Ramella/Extender: [name] in Treatment: 0 Encounter Discharge Information Items Discharge Condition: Stable Ambulatory Status: Ambulatory Discharge Destination: Home Transportation: Private Auto Accompanied By: spouse Schedule Follow-up Appointment: Yes Clinical Summary of Care: Patient Declined Electronic Signature(s) Signed: 12/22/2020 4:07:03 PM By: Baruch Gouty RN, BSN Entered By: Baruch Gouty on 12/21/2020 09:38:31 -------------------------------------------------------------------------------- Lower Extremity Assessment Details Patient Name: Date of Service: [name], [name] 12/21/2020 8:45 A M Medical Record Number: [id number] Patient Account Number: [id number] Date of Birth/Sex: Treating RN: [birth year] (65 y.o. [name] Primary Care Willmer Fellers: [name] Other Clinician: Referring Jeniah Kishi: Treating Cordney Barstow/Extender: Glenna Fellows Weeks in Treatment: 0 Edema Assessment Assessed: [Left: No] [Right: No] Edema: [Left: Ye] [Right: s] Calf Left: Right: Point of Measurement: From Medial Instep 46.3 cm Ankle Left: Right: Point of Measurement: From Medial Instep 24.6 cm Vascular Assessment Pulses: Dorsalis Pedis Palpable: [Right:Yes] Posterior Tibial Palpable: [Right:Yes] Electronic Signature(s) Signed: 12/21/2020 5:16:39 PM By: Lorrin Jackson Signed: 12/22/2020 5:06:28 PM By: Dellie Catholic RN Entered By: Dellie Catholic on 12/21/2020 08:46:47 -------------------------------------------------------------------------------- Multi Wound Chart Details Patient Name: Date of Service: [name], [name] 12/21/2020 8:45 A M Medical Record Number: [id number] Patient Account Number: [id number] Date of Birth/Sex: Treating RN: Jul 17, 1955 (65 y.o. [name] Primary Care Mitsugi Schrader: [name] Other Clinician: Referring Geneve Kimpel: Treating  Lofton Leon/Extender: [name] in Treatment: 0 Vital Signs Height(in): 1 Pulse(bpm): 51 Weight(lbs): 160 Blood Pressure(mmHg): 160/82 Body Mass Index(BMI): 43 Temperature(F): 98.4 Respiratory Rate(breaths/min): 16 Photos: [1:Right Metatarsal head first] [N/A:N/A N/A] Wound Location: [1:Gradually Appeared] [N/A:N/A] Wounding Event: [1:T be determined o] [N/A:N/A] Primary Etiology: [1:Sleep Apnea, Coronary Artery] [N/A:N/A] Comorbid History: [1:Disease, Hypertension, Type II Diabetes, Neuropathy 10/29/2020] [N/A:N/A] Date Acquired: [1:0] [N/A:N/A] Weeks of Treatment: [1:Open] [N/A:N/A] Wound Status: [1:0.8x0.4x0.1] [N/A:N/A] Measurements L x W x D (cm) [1:0.251] [N/A:N/A] A (cm) : rea [1:0.025] [N/A:N/A] Volume (cm) : [  1:40.80%] [N/A:N/A] % Reduction in A rea: [1:40.50%] [N/A:N/A] % Reduction in Volume: [1:12] Starting Position 1 (o'clock): [1:5] Ending Position 1 (o'clock): [1:0.3] Maximum Distance 1 (cm): [1:Yes] [N/A:N/A] Undermining: [1:Partial Thickness] [N/A:N/A] Classification: [1:Medium] [N/A:N/A] Exudate A mount: [1:Serosanguineous] [N/A:N/A] Exudate Type: [1:red, brown] [N/A:N/A] Exudate Color: [1:Distinct, outline attached] [N/A:N/A] Wound Margin: [1:Large (67-100%)] [N/A:N/A] Granulation A mount: [1:Red, Pink] [N/A:N/A] Granulation Quality: [1:Small (1-33%)] [N/A:N/A] Necrotic A mount: [1:Fat Layer (Subcutaneous Tissue): Yes N/A] Exposed Structures: [1:Fascia: No Tendon: No Muscle: No Joint: No Bone: No T Contact Cast otal] [N/A:N/A] Treatment Notes Wound #1 (Metatarsal head first) Wound Laterality: Right Cleanser Soap and Water Discharge Instruction: May shower and wash wound with dial antibacterial soap and water prior to dressing change. Wound Cleanser Discharge Instruction: Cleanse the wound with wound cleanser prior to applying a clean dressing using gauze sponges, not tissue or cotton balls. Peri-Wound  Care Topical Primary Dressing KerraCel Ag Gelling Fiber Dressing, 4x5 in (silver alginate) Discharge Instruction: Apply silver alginate to wound bed as instructed Secondary Dressing Woven Gauze Sponges 2x2 in Discharge Instruction: Apply over primary dressing as directed. Optifoam Non-Adhesive Dressing, 4x4 in Discharge Instruction: Apply over primary dressing cut to make foam donut Secured With [place] [facility] T ape, 4 x 10 (in/yd) Discharge Instruction: Secure with tape as directed. Compression Wrap Compression Stockings Add-Ons Electronic Signature(s) Signed: 12/21/2020 10:34:13 AM By: Kalman Shan DO Signed: 12/21/2020 5:16:39 PM By: Lorrin Jackson Signed: 12/21/2020 5:16:39 PM By: Fara Chute By: Kalman Shan on 12/21/2020 10:28:56 -------------------------------------------------------------------------------- Multi-Disciplinary Care Plan Details Patient Name: Date of Service: [name], [name] 12/21/2020 8:45 A M Medical Record Number: [id number] Patient Account Number: [id number] Date of Birth/Sex: Treating RN: [birth year] (65 y.o. [name] Primary Care Azarion Hove: [name] Other Clinician: Referring Winton Offord: Treating Kandyce Dieguez/Extender: [name] in Treatment: 0 Active Inactive Nutrition Nursing Diagnoses: Impaired glucose control: actual or potential Goals: Patient/caregiver verbalizes understanding of need to maintain therapeutic glucose control per primary care physician Date Initiated: 12/17/2020 Target Resolution Date: 01/21/2021 Goal Status: Active Interventions: Assess HgA1c results as ordered upon admission and as needed Assess patient nutrition upon admission and as needed per policy Provide education on elevated blood sugars and impact on wound healing Notes: Wound/Skin Impairment Nursing Diagnoses: Impaired tissue integrity Goals: Patient/caregiver will  verbalize understanding of skin care regimen Date Initiated: 12/17/2020 Target Resolution Date: 01/21/2021 Goal Status: Active Ulcer/skin breakdown will have a volume reduction of 30% by week 4 Date Initiated: 12/17/2020 Target Resolution Date: 01/21/2021 Goal Status: Active Interventions: Assess patient/caregiver ability to obtain necessary supplies Assess patient/caregiver ability to perform ulcer/skin care regimen upon admission and as needed Assess ulceration(s) every visit Provide education on ulcer and skin care Treatment Activities: Topical wound management initiated : 12/17/2020 Notes: Electronic Signature(s) Signed: 12/22/2020 4:07:03 PM By: Baruch Gouty RN, BSN Entered By: Baruch Gouty on 12/21/2020 09:01:19 -------------------------------------------------------------------------------- Pain Assessment Details Patient Name: Date of Service: [name], [name] 12/21/2020 8:45 A M Medical Record Number: [id number] Patient Account Number: [id number] Date of Birth/Sex: Treating RN: 1955-04-21 (65 y.o. [name] Primary Care Makenley Shimp: [name] Other Clinician: Referring Aino Heckert: Treating Reola Buckles/Extender: [name] in Treatment: 0 Active Problems Location of Pain Severity and Description of Pain Patient Has Paino No Site Locations Rate the pain. Current Pain Level: 0 Pain Management and Medication Current Pain Management: Medication: No Cold Application: No Rest: No Massage: No Activity: No  T.E.N.S.: No Heat Application: No Leg drop or elevation: No Is the Current Pain Management Adequate: Adequate How does your wound impact your activities of daily livingo Sleep: No Bathing: No Appetite: No Relationship With Others: No Bladder Continence: No Emotions: No Bowel Continence: No Work: No Toileting: No Drive: No Dressing: No Hobbies: No Electronic Signature(s) Signed: 12/21/2020 5:16:39 PM  By: Lorrin Jackson Signed: 12/22/2020 5:06:28 PM By: Dellie Catholic RN Entered By: Dellie Catholic on 12/21/2020 08:38:35 -------------------------------------------------------------------------------- Patient/Caregiver Education Details Patient Name: Date of Service: [name], [name] 10/31/2022andnbsp8:45 A M Medical Record Number: [id number] Patient Account Number: [id number] Date of Birth/Gender: Treating RN: July 29, 1955 (65 y.o. [name] Primary Care Physician: [name] Other Clinician: Referring Physician: Treating Physician/Extender: [name] in Treatment: 0 Education Assessment Education Provided To: Patient Education Topics Provided Offloading: Methods: Explain/Verbal Responses: Reinforcements needed, State content correctly Wound/Skin Impairment: Methods: Explain/Verbal Responses: Reinforcements needed, State content correctly Electronic Signature(s) Signed: 12/22/2020 4:07:03 PM By: Baruch Gouty RN, BSN Entered By: Baruch Gouty on 12/21/2020 09:01:46 -------------------------------------------------------------------------------- Wound Assessment Details Patient Name: Date of Service: [name], [name] 12/21/2020 8:45 A M Medical Record Number: [id number] Patient Account Number: [id number] Date of Birth/Sex: Treating RN: 1955/11/23 (65 y.o. [name] Primary Care Faiza Bansal: [name] Other Clinician: Referring Sister Carbone: Treating Carlette Palmatier/Extender: [name] in Treatment: 0 Wound Status Wound Number: 1 Primary T be determined o Etiology: Wound Location: Right Metatarsal head first Wound Open Wounding Event: Gradually Appeared Status: Date Acquired: 10/29/2020 Comorbid Sleep Apnea, Coronary Artery Disease, Hypertension, Type II Weeks Of Treatment: 0 History: Diabetes, Neuropathy Clustered Wound: No Photos Wound Measurements Length:  (cm) 0.8 Width: (cm) 0.4 Depth: (cm) 0.1 Area: (cm) 0.251 Volume: (cm) 0.025 % Reduction in Area: 40.8% % Reduction in Volume: 40.5% Tunneling: No Undermining: Yes Starting Position (o'clock): 12 Ending Position (o'clock): 5 Maximum Distance: (cm) 0.3 Wound Description Classification: Partial Thickness Wound Margin: Distinct, outline attached Exudate Amount: Medium Exudate Type: Serosanguineous Exudate Color: red, brown Foul Odor After Cleansing: No Slough/Fibrino Yes Wound Bed Granulation Amount: Large (67-100%) Exposed Structure Granulation Quality: Red, Pink Fascia Exposed: No Necrotic Amount: Small (1-33%) Fat Layer (Subcutaneous Tissue) Exposed: Yes Necrotic Quality: Adherent Slough Tendon Exposed: No Muscle Exposed: No Joint Exposed: No Bone Exposed: No Treatment Notes Wound #1 (Metatarsal head first) Wound Laterality: Right Cleanser Soap and Water Discharge Instruction: May shower and wash wound with dial antibacterial soap and water prior to dressing change. Wound Cleanser Discharge Instruction: Cleanse the wound with wound cleanser prior to applying a clean dressing using gauze sponges, not tissue or cotton balls. Peri-Wound Care Topical Primary Dressing KerraCel Ag Gelling Fiber Dressing, 4x5 in (silver alginate) Discharge Instruction: Apply silver alginate to wound bed as instructed Secondary Dressing Woven Gauze Sponges 2x2 in Discharge Instruction: Apply over primary dressing as directed. Optifoam Non-Adhesive Dressing, 4x4 in Discharge Instruction: Apply over primary dressing cut to make foam donut Secured With [place] [facility] T ape, 4 x 10 (in/yd) Discharge Instruction: Secure with tape as directed. Compression Wrap Compression Stockings Add-Ons Electronic Signature(s) Signed: 12/21/2020 5:16:39 PM By: Lorrin Jackson Signed: 12/22/2020 5:06:28 PM By: Dellie Catholic RN Entered By: Dellie Catholic on 12/21/2020  08:59:04 -------------------------------------------------------------------------------- Vitals Details Patient Name: Date of Service: [name], [name] 12/21/2020 8:45 A M Medical Record Number: [id number] Patient Account Number: [id number] Date of Birth/Sex: Treating RN: [birth year] (65 y.o. [name]  Primary Care Alexi Geibel: [name] Other Clinician: Referring Emmakate Hypes: Treating Berneita Sanagustin/Extender: [name] in Treatment: 0 Vital Signs Time Taken: 08:35 Temperature (F): 98.4 Height (in): 67 Pulse (bpm): 71 Weight (lbs): 277 Respiratory Rate (breaths/min): 16 Body Mass Index (BMI): 43.4 Blood Pressure (mmHg): 160/82 Reference Range: 80 - 120 mg / dl Electronic Signature(s) Signed: 12/22/2020 5:06:28 PM By: Dellie Catholic RN Entered By: Dellie Catholic on 12/21/2020 08:37:35

## 2020-12-22 NOTE — Telephone Encounter (Signed)
Faxed new order to [place] for a regular walker, patient is aware.

## 2020-12-22 NOTE — Progress Notes (Signed)
[name] ([id number]) Visit Report for 12/21/2020 Chief Complaint Document Details Patient Name: Date of Service: MA Reita May [name] K. 12/21/2020 8:45 [name] M Medical Record Number: [id number] Patient Account Number: [id number] Date of Birth/Sex: Treating RN: 06/15/1955 (65 y.o. [name] Primary Care Provider: [name] Other Clinician: Referring Provider: Treating Provider/Extender: [name] in Treatment: 0 Information Obtained from: Patient Chief Complaint Right Plantar foot wound Electronic Signature(s) Signed: 12/21/2020 10:34:13 AM By: Kalman Shan DO Entered By: Kalman Shan on 12/21/2020 10:29:06 -------------------------------------------------------------------------------- HPI Details Patient Name: Date of Service: [name] [name] K. 12/21/2020 8:45 [name] M Medical Record Number: [id number] Patient Account Number: [id number] Date of Birth/Sex: Treating RN: 1955/04/01 (65 y.o. [name] Primary Care Provider: [name] Other Clinician: Referring Provider: Treating Provider/Extender: [name] in Treatment: 0 History of Present Illness HPI Description: Admission 12/17/2020 [name] is [name] 65 year old female with [name] past medical history of insulin-dependent type 2 diabetes, hypothyroidism and daily1 pack per day cigarette smoker the presents to the clinic for [name] 6-week history of nonhealing wound to the right first MTPJ. She has been following with [name], podiatry for this issue. She has been using silver alginate with dressing changes. She uses [name] postsurgical shoe and offloading pads. She currently denies signs of infection. 10/31; patient presents for follow-up. She tolerated the soft cast fine although she states that she felt her foot rolling to one side. She denies signs of infection. She would like to do the total contact cast  today. Electronic Signature(s) Signed: 12/21/2020 10:34:13 AM By: Kalman Shan DO Entered By: Kalman Shan on 12/21/2020 10:30:06 -------------------------------------------------------------------------------- Physical Exam Details Patient Name: Date of Service: [name] [name] K. 12/21/2020 8:45 [name] M Medical Record Number: [id number] Patient Account Number: [id number] Date of Birth/Sex: Treating RN: 1955/08/04 (65 y.o. [name] Primary Care Provider: Other Clinician: [name] Referring Provider: Treating Provider/Extender: [name] in Treatment: 0 Constitutional respirations regular, non-labored and within target range for patient.. Cardiovascular 2+ dorsalis pedis/posterior tibialis pulses. Psychiatric pleasant and cooperative. Notes Punched out wound with granulation tissue present in the wound bed. No surrounding signs of infection. Electronic Signature(s) Signed: 12/21/2020 10:34:13 AM By: Kalman Shan DO Entered By: Kalman Shan on 12/21/2020 10:30:40 -------------------------------------------------------------------------------- Physician Orders Details Patient Name: Date of Service: [name] [name] K. 12/21/2020 8:45 [name] M Medical Record Number: [id number] Patient Account Number: [id number] Date of Birth/Sex: Treating RN: 1956/01/21 (65 y.o. [name] Primary Care Provider: [name] Other Clinician: Referring Provider: Treating Provider/Extender: [name] in Treatment: 0 Verbal / Phone Orders: No Diagnosis Coding ICD-10 Coding Code Description E11.621 Type 2 diabetes mellitus with foot ulcer E11.42 Type 2 diabetes mellitus with diabetic polyneuropathy F17.290 Nicotine dependence, other tobacco product, uncomplicated Follow-up Appointments ppointment in 1 week. - on Monday with Dr. Heber  Return [name] ppointment in: - Wed. for initial  cast change Return [name] Cellular or Tissue Based Products Cellular or Tissue Based Product Type: - RUN IVR FOR PURAPLY Bathing/ Shower/ Hygiene May shower with protection but do not get wound dressing(s) wet. Edema Control - Lymphedema / SCD / Other Elevate legs to the level of the heart or above for 30 minutes daily and/or when sitting, [name] frequency of: Avoid standing for long periods of time. Off-Loading Total Contact Cast to Right Lower Extremity Wound Treatment Wound #1 -  Metatarsal head first Wound Laterality: Right Cleanser: Soap and Water 1 x Per Week Discharge Instructions: May shower and wash wound with dial antibacterial soap and water prior to dressing change. Cleanser: Wound Cleanser 1 x Per Week Discharge Instructions: Cleanse the wound with wound cleanser prior to applying [name] clean dressing using gauze sponges, not tissue or cotton balls. Prim Dressing: KerraCel Ag Gelling Fiber Dressing, 4x5 in (silver alginate) 1 x Per Week ary Discharge Instructions: Apply silver alginate to wound bed as instructed Secondary Dressing: Woven Gauze Sponges 2x2 in 1 x Per Week Discharge Instructions: Apply over primary dressing as directed. Secondary Dressing: Optifoam Non-Adhesive Dressing, 4x4 in 1 x Per Week Discharge Instructions: Apply over primary dressing cut to make foam donut Secured With: 9M [employer] T ape, 4 x 10 (in/yd) 1 x Per Week Discharge Instructions: Secure with tape as directed. Electronic Signature(s) Signed: 12/21/2020 10:34:13 AM By: Kalman Shan DO Entered By: Kalman Shan on 12/21/2020 10:30:52 -------------------------------------------------------------------------------- Problem List Details Patient Name: Date of Service: [name] [name] K. 12/21/2020 8:45 [name] M Medical Record Number: [id number] Patient Account Number: [id number] Date of Birth/Sex: Treating RN: February 28, 1955 (65 y.o. [name] Primary Care Provider:  [name] Other Clinician: Referring Provider: Treating Provider/Extender: [name] in Treatment: 0 Active Problems ICD-10 Encounter Code Description Active Date MDM Diagnosis S91.301D Unspecified open wound, right foot, subsequent encounter 12/21/2020 No Yes E11.621 Type 2 diabetes mellitus with foot ulcer 12/17/2020 No Yes E11.42 Type 2 diabetes mellitus with diabetic polyneuropathy 12/17/2020 No Yes F17.290 Nicotine dependence, other tobacco product, uncomplicated 53/64/6803 No Yes Inactive Problems Resolved Problems Electronic Signature(s) Signed: 12/21/2020 10:34:13 AM By: Kalman Shan DO Entered By: Kalman Shan on 12/21/2020 10:28:50 -------------------------------------------------------------------------------- Progress Note Details Patient Name: Date of Service: Lyerly, [name] [name] K. 12/21/2020 8:45 [name] M Medical Record Number: [id number] Patient Account Number: [id number] Date of Birth/Sex: Treating RN: 12-24-1955 (65 y.o. [name] Primary Care Provider: [name] Other Clinician: Referring Provider: Treating Provider/Extender: [name] in Treatment: 0 Subjective Chief Complaint Information obtained from Patient Right Plantar foot wound History of Present Illness (HPI) Admission 12/17/2020 Ms. Aryana Wonnacott is [name] 65 year old female with [name] past medical history of insulin-dependent type 2 diabetes, hypothyroidism and daily1 pack per day cigarette smoker the presents to the clinic for [name] 6-week history of nonhealing wound to the right first MTPJ. She has been following with [name], podiatry for this issue. She has been using silver alginate with dressing changes. She uses [name] postsurgical shoe and offloading pads. She currently denies signs of infection. 10/31; patient presents for follow-up. She tolerated the soft cast fine although she states that she felt her  foot rolling to one side. She denies signs of infection. She would like to do the total contact cast today. Patient History Information obtained from Patient, Chart. Family History Unknown History. Social History Current every day smoker - 1 pack/[name]/day, Alcohol Use - Never, Drug Use - No History, Caffeine Use - Moderate. Medical History Eyes Denies history of Cataracts, Glaucoma, Optic Neuritis Ear/Nose/Mouth/Throat Denies history of Chronic sinus problems/congestion, Middle ear problems Hematologic/Lymphatic Denies history of Anemia, Hemophilia, Human Immunodeficiency Virus, Lymphedema, Sickle Cell Disease Respiratory Patient has history of Sleep Apnea Denies history of Aspiration, Asthma, Chronic Obstructive Pulmonary Disease (COPD), Pneumothorax, Tuberculosis Cardiovascular Patient has history of Coronary Artery Disease, Hypertension Denies history of Angina, Arrhythmia, Congestive Heart Failure, Deep Vein Thrombosis,  Hypotension, Myocardial Infarction, Peripheral Arterial Disease, Peripheral Venous Disease, Phlebitis, Vasculitis Gastrointestinal Denies history of Cirrhosis , Colitis, Crohnoos, Hepatitis [name], Hepatitis B, Hepatitis C Endocrine Patient has history of Type II Diabetes Denies history of Type I Diabetes Genitourinary Denies history of End Stage Renal Disease Immunological Denies history of Lupus Erythematosus, Raynaudoos, Scleroderma Integumentary (Skin) Denies history of History of Burn Musculoskeletal Denies history of Gout, Rheumatoid Arthritis, Osteoarthritis, Osteomyelitis Neurologic Patient has history of Neuropathy Denies history of Dementia, Quadriplegia, Paraplegia, Seizure Disorder Hospitalization/Surgery History - 2nd and 3rd right toe amps. - total hysterectomy. - cholecystectomy. - 2 knee surgery's on left and 1 on right. Medical [name] Surgical History Notes nd Cardiovascular hypercholesterolemia Objective Constitutional respirations regular,  non-labored and within target range for patient.. Vitals Time Taken: 8:35 AM, Height: 67 in, Weight: 277 lbs, BMI: 43.4, Temperature: 98.4 F, Pulse: 71 bpm, Respiratory Rate: 16 breaths/min, Blood Pressure: 160/82 mmHg. Cardiovascular 2+ dorsalis pedis/posterior tibialis pulses. Psychiatric pleasant and cooperative. General Notes: Punched out wound with granulation tissue present in the wound bed. No surrounding signs of infection. Integumentary (Hair, Skin) Wound #1 status is Open. Original cause of wound was Gradually Appeared. The date acquired was: 10/29/2020. The wound is located on the Right Metatarsal head first. The wound measures 0.8cm length x 0.4cm width x 0.1cm depth; 0.251cm^2 area and 0.025cm^3 volume. There is Fat Layer (Subcutaneous Tissue) exposed. There is no tunneling noted, however, there is undermining starting at 12:00 and ending at 5:00 with [name] maximum distance of 0.3cm. There is [name] medium amount of serosanguineous drainage noted. The wound margin is distinct with the outline attached to the wound base. There is large (67-100%) red, pink granulation within the wound bed. There is [name] small (1-33%) amount of necrotic tissue within the wound bed including Adherent Slough. Assessment Active Problems ICD-10 Unspecified open wound, right foot, subsequent encounter Type 2 diabetes mellitus with foot ulcer Type 2 diabetes mellitus with diabetic polyneuropathy Nicotine dependence, other tobacco product, uncomplicated There has been slight improvement in appearance in the wound bed since last clinic visit. She tolerated the soft cast fine and would like to try the total contact cast. No signs of infection on exam. ABIs are 0.93 on the right. T contact cast was placed in standard fashion today. She knows that she cannot get this otal wet and the cast will need to be replaced in 48hrs. We will continue with silver alginate under the cast. We still have not heard back from [employer]  for insurance approval. Procedures Wound #1 Pre-procedure diagnosis of Wound #1 is [name] T be determined located on the Right Metatarsal head first . There was [name] T Contact Cast Procedure by Elisabeth Most, DO. Post procedure Diagnosis Wound #1: Same as Pre-Procedure Plan Follow-up Appointments: Return Appointment in 1 week. - on Monday with Dr. Heber Alden Return Appointment in: - Wed. for initial cast change Cellular or Tissue Based Products: Cellular or Tissue Based Product Type: - RUN IVR FOR PURAPLY Bathing/ Shower/ Hygiene: May shower with protection but do not get wound dressing(s) wet. Edema Control - Lymphedema / SCD / Other: Elevate legs to the level of the heart or above for 30 minutes daily and/or when sitting, [name] frequency of: Avoid standing for long periods of time. Off-Loading: T Contact Cast to Right Lower Extremity otal WOUND #1: - Metatarsal head first Wound Laterality: Right Cleanser: Soap and Water 1 x Per Week/ Discharge Instructions: May shower and wash wound with dial antibacterial soap and water  prior to dressing change. Cleanser: Wound Cleanser 1 x Per Week/ Discharge Instructions: Cleanse the wound with wound cleanser prior to applying [name] clean dressing using gauze sponges, not tissue or cotton balls. Prim Dressing: KerraCel Ag Gelling Fiber Dressing, 4x5 in (silver alginate) 1 x Per Week/ ary Discharge Instructions: Apply silver alginate to wound bed as instructed Secondary Dressing: Woven Gauze Sponges 2x2 in 1 x Per Week/ Discharge Instructions: Apply over primary dressing as directed. Secondary Dressing: Optifoam Non-Adhesive Dressing, 4x4 in 1 x Per Week/ Discharge Instructions: Apply over primary dressing cut to make foam donut Secured With: 46M Med[employer] H Soft Cloth Surgical T ape, 4 x 10 (in/yd) 1 x Per Week/ Discharge Instructions: Secure with tape as directed. 1. Silver alginate under total contact cast 2. Follow-up on Wednesday for cast  change 3. Follow-up with me in 1 week Electronic Signature(s) Signed: 12/21/2020 10:34:13 AM By: Kalman Shan DO Entered By: Kalman Shan on 12/21/2020 10:33:08 -------------------------------------------------------------------------------- HxROS Details Patient Name: Date of Service: [name] [name] K. 12/21/2020 8:45 [name] M Medical Record Number: [id number] Patient Account Number: [id number] Date of Birth/Sex: Treating RN: 1955/09/07 (65 y.o. [name] Primary Care Provider: [name] Other Clinician: Referring Provider: Treating Provider/Extender: [name] in Treatment: 0 Information Obtained From Patient Chart Eyes Medical History: Negative for: Cataracts; Glaucoma; Optic Neuritis Ear/Nose/Mouth/Throat Medical History: Negative for: Chronic sinus problems/congestion; Middle ear problems Hematologic/Lymphatic Medical History: Negative for: Anemia; Hemophilia; Human Immunodeficiency Virus; Lymphedema; Sickle Cell Disease Respiratory Medical History: Positive for: Sleep Apnea Negative for: Aspiration; Asthma; Chronic Obstructive Pulmonary Disease (COPD); Pneumothorax; Tuberculosis Cardiovascular Medical History: Positive for: Coronary Artery Disease; Hypertension Negative for: Angina; Arrhythmia; Congestive Heart Failure; Deep Vein Thrombosis; Hypotension; Myocardial Infarction; Peripheral Arterial Disease; Peripheral Venous Disease; Phlebitis; Vasculitis Past Medical History Notes: hypercholesterolemia Gastrointestinal Medical History: Negative for: Cirrhosis ; Colitis; Crohns; Hepatitis [name]; Hepatitis B; Hepatitis C Endocrine Medical History: Positive for: Type II Diabetes Negative for: Type I Diabetes Genitourinary Medical History: Negative for: End Stage Renal Disease Immunological Medical History: Negative for: Lupus Erythematosus; Raynauds; Scleroderma Integumentary (Skin) Medical  History: Negative for: History of Burn Musculoskeletal Medical History: Negative for: Gout; Rheumatoid Arthritis; Osteoarthritis; Osteomyelitis Neurologic Medical History: Positive for: Neuropathy Negative for: Dementia; Quadriplegia; Paraplegia; Seizure Disorder Immunizations Pneumococcal Vaccine: Received Pneumococcal Vaccination: Yes Received Pneumococcal Vaccination On or After 60th Birthday: Yes Tetanus Vaccine: Last tetanus shot: 12/17/2020 Implantable Devices No devices added Hospitalization / Surgery History Type of Hospitalization/Surgery 2nd and 3rd right toe amps total hysterectomy cholecystectomy 2 knee surgery's on left and 1 on right Family and Social History Unknown History: Yes; Current every day smoker - 1 pack/[name]/day; Alcohol Use: Never; Drug Use: No History; Caffeine Use: Moderate; Financial Concerns: No; Food, Clothing or Shelter Needs: No; Support System Lacking: No; Transportation Concerns: No Electronic Signature(s) Signed: 12/21/2020 10:34:13 AM By: Kalman Shan DO Signed: 12/21/2020 5:16:39 PM By: Lorrin Jackson Entered By: Kalman Shan on 12/21/2020 10:30:13 -------------------------------------------------------------------------------- Total Contact Cast Details Patient Name: Date of Service: [name] [name] K. 12/21/2020 8:45 [name] M Medical Record Number: [id number] Patient Account Number: [id number] Date of Birth/Sex: Treating RN: 08-25-1955 (65 y.o. [name] Primary Care Provider: [name] Other Clinician: Referring Provider: Treating Provider/Extender: [name] in Treatment: 0 T Contact Cast Applied for Wound Assessment: otal Wound #1 Right Metatarsal head first Performed By: Physician Kalman Shan, DO Post Procedure Diagnosis Same as Pre-procedure Electronic Signature(s) Signed: 12/21/2020 10:34:13  AM By: Kalman Shan DO Signed: 12/22/2020 4:07:03 PM By: Baruch Gouty RN, BSN Entered By: Baruch Gouty on 12/21/2020 09:16:19 -------------------------------------------------------------------------------- Odin Details Patient Name: Date of Service: [name] [name] K. 12/21/2020 Medical Record Number: [id number] Patient Account Number: [id number] Date of Birth/Sex: Treating RN: [birth year] (64 y.o. [name] Primary Care Provider: [name] Other Clinician: Referring Provider: Treating Provider/Extender: [name] in Treatment: 0 Diagnosis Coding ICD-10 Codes Code Description [phone] Type 2 diabetes mellitus with foot ulcer E11.42 Type 2 diabetes mellitus with diabetic polyneuropathy F17.290 Nicotine dependence, other tobacco product, uncomplicated Facility Procedures CPT4 Code: [id number] Description: 29445 - APPLY TOTAL CONTACT LEG CAST ICD-10 Diagnosis Description E11.621 Type 2 diabetes mellitus with foot ulcer Modifier: Quantity: 1 Physician Procedures : CPT4 Code Description Modifier [id number] - WC PHYS APPLY TOTAL CONTACT CAST ICD-10 Diagnosis Description E11.621 Type 2 diabetes mellitus with foot ulcer Quantity: 1 Electronic Signature(s) Signed: 12/21/2020 10:34:13 AM By: Kalman Shan DO Entered By: Kalman Shan on 12/21/2020 10:33:56

## 2020-12-22 NOTE — Telephone Encounter (Signed)
Faxed the new order to [facility] to [facility]11/01/22, patient is aware

## 2020-12-23 ENCOUNTER — Encounter (HOSPITAL_BASED_OUTPATIENT_CLINIC_OR_DEPARTMENT_OTHER): Payer: Medicare Other | Attending: Physician Assistant | Admitting: Physician Assistant

## 2020-12-23 ENCOUNTER — Other Ambulatory Visit: Payer: Self-pay

## 2020-12-23 DIAGNOSIS — L97512 Non-pressure 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: Secondary | ICD-10-CM | POA: Diagnosis not present

## 2020-12-23 DIAGNOSIS — F17210 Nicotine dependence, cigarettes, uncomplicated: Secondary | ICD-10-CM | POA: Diagnosis not present

## 2020-12-23 DIAGNOSIS — Z794 Long term (current) use of insulin: Secondary | ICD-10-CM | POA: Insufficient documentation

## 2020-12-23 DIAGNOSIS — E1142 Type 2 diabetes mellitus with diabetic polyneuropathy: Secondary | ICD-10-CM | POA: Insufficient documentation

## 2020-12-23 DIAGNOSIS — E11621 Type 2 diabetes mellitus with foot ulcer: Secondary | ICD-10-CM | POA: Diagnosis not present

## 2020-12-23 DIAGNOSIS — L97511 Non-pressure chronic ulcer of other part of right foot limited to breakdown of skin: Secondary | ICD-10-CM | POA: Diagnosis not present

## 2020-12-23 NOTE — Progress Notes (Signed)
[name], [name] ([id number]) Visit Report for 12/23/2020 Arrival Information Details Patient Name: Date of Service: [name] [name], [name] 12/23/2020 8:00 [name] M Medical Record Number: [id number] Patient Account Number: [id number] Date of Birth/Sex: Treating RN: [birth year] (65 y.o. [name] Primary Care Girtha Kilgore: [name] Other Clinician: Referring [name]: Treating [name]/Extender: [name], [name] in Treatment: 0 Visit Information History Since Last Visit Added or deleted any medications: No Patient Arrived: Ambulatory Any new allergies or adverse reactions: No Arrival Time: 09:03 Had [name] fall or experienced change in No Accompanied By: self activities of daily living that may affect Transfer Assistance: None risk of falls: Patient Identification Verified: Yes Signs or symptoms of abuse/neglect since last No Secondary Verification Process Completed: Yes visito Patient Requires Transmission-Based Precautions: No Hospitalized since last visit: No Patient Has Alerts: No Implantable device outside of the clinic No excluding cellular tissue based products placed in the center since last visit: Has Dressing in Place as Prescribed: Yes Has Footwear/Offloading in Place as Yes Prescribed: Right: Removable Cast Walker/Walking Boot Pain Present Now: No Electronic Signature(s) Signed: 12/23/2020 5:50:27 PM By: Baruch Gouty RN, BSN Entered By: Baruch Gouty on 12/23/2020 09:03:37 -------------------------------------------------------------------------------- Encounter Discharge Information Details Patient Name: Date of Service: [name] [name], [name] 12/23/2020 8:00 [name] M Medical Record Number: [id number] Patient Account Number: [id number] Date of Birth/Sex: Treating RN: 03-Sep-1955 (65 y.o. [name] Primary Care [name]: [name] Other Clinician: Referring [name]: Treating [name]/Extender: [name], [name] in Treatment: 0 Encounter Discharge Information Items Discharge Condition: Stable Ambulatory Status: Ambulatory Discharge Destination: Home Transportation: Private Auto Accompanied By: sister in law Schedule Follow-up Appointment: Yes Clinical Summary of Care: Patient Declined Electronic Signature(s) Signed: 12/23/2020 5:50:27 PM By: Baruch Gouty RN, BSN Entered By: Baruch Gouty on 12/23/2020 09:12:41 -------------------------------------------------------------------------------- Lower Extremity Assessment Details Patient Name: Date of Service: [name] [name], [name] 12/23/2020 8:00 [name] M Medical Record Number: [id number] Patient Account Number: [id number] Date of Birth/Sex: Treating RN: Sep 08, 1955 (65 y.o. [name] Primary Care [name]: [name] Other Clinician: Referring Madonna Flegal: Treating [name]/Extender: [name], [name] [name] Weeks in Treatment: 0 Edema Assessment Assessed: [Left: No] [Right: No] Edema: [Left: Ye] [Right: s] Calf Left: Right: Point of Measurement: From Medial Instep 46.3 cm Ankle Left: Right: Point of Measurement: From Medial Instep 24.6 cm Electronic Signature(s) Signed: 12/23/2020 5:50:27 PM By: Baruch Gouty RN, BSN Entered By: Baruch Gouty on 12/23/2020 09:06:51 -------------------------------------------------------------------------------- Multi-Disciplinary Care Plan Details Patient Name: Date of Service: [name] [name], [name] 12/23/2020 8:00 [name] M Medical Record Number: [id number] Patient Account Number: [id number] Date of Birth/Sex: Treating RN: 1955-11-23 (65 y.o. [name] Primary Care [name]: [name] Other Clinician: Referring Jered Heiny: Treating Nataliya Graig/Extender: [name], [name] in Treatment: 0 Active Inactive Nutrition Nursing Diagnoses: Impaired glucose control: actual or  potential Goals: Patient/caregiver verbalizes understanding of need to maintain therapeutic glucose control per primary care physician Date Initiated: 12/17/2020 Target Resolution Date: 01/21/2021 Goal Status: Active Interventions: Assess HgA1c results as ordered upon admission and as needed Assess patient nutrition upon admission and as needed per policy Provide education on elevated blood sugars and impact on wound healing Notes: Wound/Skin Impairment Nursing Diagnoses: Impaired tissue integrity Goals: Patient/caregiver will verbalize understanding of skin care regimen Date Initiated: 12/17/2020 Target Resolution Date: 01/21/2021 Goal Status: Active Ulcer/skin breakdown will have [name] volume reduction of 30% by week  4 Date Initiated: 12/17/2020 Target Resolution Date: 01/21/2021 Goal Status: Active Interventions: Assess patient/caregiver ability to obtain necessary supplies Assess patient/caregiver ability to perform ulcer/skin care regimen upon admission and as needed Assess ulceration(s) every visit Provide education on ulcer and skin care Treatment Activities: Topical wound management initiated : 12/17/2020 Notes: Electronic Signature(s) Signed: 12/23/2020 5:50:27 PM By: Baruch Gouty RN, BSN Entered By: Baruch Gouty on 12/23/2020 09:09:41 -------------------------------------------------------------------------------- Pain Assessment Details Patient Name: Date of Service: [name] [name], [name] 12/23/2020 8:00 [name] M Medical Record Number: [id number] Patient Account Number: [id number] Date of Birth/Sex: Treating RN: [birth year] (66 y.o. [name] Primary Care Daivd Fredericksen: [name] Other Clinician: Referring [name]: Treating Donnell [name]/Extender: [name], [name] [name] Weeks in Treatment: 0 Active Problems Location of Pain Severity and Description of Pain Patient Has Paino No Site Locations Rate the pain. Current Pain Level: 0 Pain  Management and Medication Current Pain Management: Electronic Signature(s) Signed: 12/23/2020 5:50:27 PM By: Baruch Gouty RN, BSN Entered By: Baruch Gouty on 12/23/2020 09:06:38 -------------------------------------------------------------------------------- Patient/Caregiver Education Details Patient Name: Date of Service: [name] [name], [place] 11/2/2022andnbsp8:00 [name] M Medical Record Number: [id number] Patient Account Number: [id number] Date of Birth/Gender: Treating RN: Oct 06, 1955 (65 y.o. [name] Primary Care Physician: [name] Other Clinician: Referring Physician: Treating Physician/Extender: [name], [name] in Treatment: 0 Education Assessment Education Provided To: Patient Education Topics Provided Elevated Blood Sugar/ Impact on Healing: Methods: Explain/Verbal Responses: Reinforcements needed, State content correctly Offloading: Methods: Explain/Verbal Responses: Reinforcements needed, State content correctly Wound/Skin Impairment: Methods: Explain/Verbal Responses: Reinforcements needed, State content correctly Electronic Signature(s) Signed: 12/23/2020 5:50:27 PM By: Baruch Gouty RN, BSN Entered By: Baruch Gouty on 12/23/2020 09:10:13 -------------------------------------------------------------------------------- Wound Assessment Details Patient Name: Date of Service: [name] [name], [name] 12/23/2020 8:00 [name] M Medical Record Number: [id number] Patient Account Number: [id number] Date of Birth/Sex: Treating RN: 07-19-1955 (65 y.o. [name] Primary Care Anton Cheramie: [name] Other Clinician: Referring Jalan Fariss: Treating Lynsey Ange/Extender: [name], [name] [name] Weeks in Treatment: 0 Wound Status Wound Number: 1 Primary Diabetic Wound/Ulcer of the Lower Extremity Etiology: Wound Location: Right Metatarsal head first Wound Open Wounding Event: Gradually  Appeared Status: Date Acquired: 10/29/2020 Comorbid Sleep Apnea, Coronary Artery Disease, Hypertension, Type II Weeks Of Treatment: 0 History: Diabetes, Neuropathy Clustered Wound: No Wound Measurements Length: (cm) 0.8 Width: (cm) 0.4 Depth: (cm) 0.2 Area: (cm) 0.251 Volume: (cm) 0.05 % Reduction in Area: 40.8% % Reduction in Volume: -19% Epithelialization: None Wound Description Classification: Grade 1 Wound Margin: Distinct, outline attached Exudate Amount: Medium Exudate Type: Serosanguineous Exudate Color: red, brown Foul Odor After Cleansing: No Slough/Fibrino Yes Wound Bed Granulation Amount: Large (67-100%) Exposed Structure Granulation Quality: Red, Pink Fascia Exposed: No Necrotic Amount: None Present (0%) Fat Layer (Subcutaneous Tissue) Exposed: Yes Tendon Exposed: No Muscle Exposed: No Joint Exposed: No Bone Exposed: No Treatment Notes Wound #1 (Metatarsal head first) Wound Laterality: Right Cleanser Soap and Water Discharge Instruction: May shower and wash wound with dial antibacterial soap and water prior to dressing change. Wound Cleanser Discharge Instruction: Cleanse the wound with wound cleanser prior to applying [name] clean dressing using gauze sponges, not tissue or cotton balls. Peri-Wound Care Topical Primary Dressing KerraCel Ag Gelling Fiber Dressing, 4x5 in (silver alginate) Discharge Instruction: Apply silver alginate to wound bed as instructed Secondary Dressing Woven Gauze Sponges 2x2 in Discharge Instruction: Apply over primary dressing as directed. Optifoam Non-Adhesive Dressing, 4x4 in  Discharge Instruction: Apply over primary dressing cut to make foam donut Secured With 56M Purdin Surgical T ape, 4 x 10 (in/yd) Discharge Instruction: Secure with tape as directed. Compression Wrap Compression Stockings Add-Ons Electronic Signature(s) Signed: 12/23/2020 5:50:27 PM By: Baruch Gouty RN, BSN Entered By: Baruch Gouty  on 12/23/2020 09:07:50 -------------------------------------------------------------------------------- Vitals Details Patient Name: Date of Service: [name] [name], [name] 12/23/2020 8:00 [name] M Medical Record Number: [id number] Patient Account Number: [id number] Date of Birth/Sex: Treating RN: 01/23/1956 (65 y.o. [name] Primary Care Raylen Ken: [name] Other Clinician: Referring Lochlin Eppinger: Treating Tylesha Gibeault/Extender: [name], [name] [name] Weeks in Treatment: 0 Vital Signs Time Taken: 09:05 Temperature (F): 98.5 Height (in): 67 Pulse (bpm): 61 Source: Stated Respiratory Rate (breaths/min): 18 Weight (lbs): 277 Blood Pressure (mmHg): 130/76 Source: Stated Reference Range: 80 - 120 mg / dl Body Mass Index (BMI): 43.4 Electronic Signature(s) Signed: 12/23/2020 5:50:27 PM By: Baruch Gouty RN, BSN Entered By: Baruch Gouty on 12/23/2020 09:06:20

## 2020-12-23 NOTE — Progress Notes (Signed)
[name], [name] ([id number]) Visit Report for 12/23/2020 Chief Complaint Document Details Patient Name: Date of Service: [name] [name], [name] 12/23/2020 8:00 A M Medical Record Number: [id number] Patient Account Number: [id number] Date of Birth/Sex: Treating RN: 11/16/1955 (65 y.o. [name] Primary Care Provider: Geoffery [name] Other Clinician: Referring Provider: Treating Provider/Extender: [name] Filler, [name] in Treatment: 0 Information Obtained from: Patient Chief Complaint Right Plantar foot wound Electronic Signature(s) Signed: 12/23/2020 10:05:49 AM By: [name] Keeler PA-C Entered By: [name] on 12/23/2020 10:05:48 -------------------------------------------------------------------------------- HPI Details Patient Name: Date of Service: [name] [name], [name] 12/23/2020 8:00 A M Medical Record Number: [id number] Patient Account Number: [id number] Date of Birth/Sex: Treating RN: [birth year] (65 y.o. [name] Primary Care Provider: Geoffery [name] Other Clinician: Referring Provider: Treating Provider/Extender: [name] Filler, [name] in Treatment: 0 History of Present Illness HPI Description: Admission 12/17/2020 [name] is a 65 year old female with a past medical history of insulin-dependent type 2 diabetes, hypothyroidism and daily1 pack per day cigarette smoker the presents to the clinic for a 6-week history of nonhealing wound to the right first MTPJ. She has been following with [name], podiatry for this issue. She has been using silver alginate with dressing changes. She uses a postsurgical shoe and offloading pads. She currently denies signs of infection. 10/31; patient presents for follow-up. She tolerated the soft cast fine although she states that she felt her foot rolling to one side. She denies signs of infection. She would like to do the total contact cast  today. 12/23/2020 upon evaluation today patient appears to be doing excellent in regard to her wound on the foot and she is in a total contact cast. I do think this is appropriate this is the first cast change which we are obliged to do to ensure nothing is rubbing everything appears to be doing quite well and very pleased in that regard. Electronic Signature(s) Signed: 12/23/2020 6:16:48 PM By: [name] Keeler PA-C Entered By: [name] on 12/23/2020 18:16:48 -------------------------------------------------------------------------------- Physical Exam Details Patient Name: Date of Service: [name] [name], [name] 12/23/2020 8:00 A M Medical Record Number: [id number] Patient Account Number: [id number] Date of Birth/Sex: Treating RN: 1955/12/31 (65 y.o. [name] Primary Care Provider: Geoffery [name] Other Clinician: Referring Provider: Treating Provider/Extender: [name] A [name] in Treatment: 0 Constitutional Well-nourished and well-hydrated in no acute distress. Respiratory normal breathing without difficulty. Psychiatric this patient is able to make decisions and demonstrates good insight into disease process. Alert and Oriented x 3. pleasant and cooperative. Notes I did actually go ahead and reapply the total contact cast today and she tolerated this application without complication. Overall I think she is doing quite well and I am hopeful that this will be beneficial for her she will be seeing [name] for follow-up next week and to replace the cast hopefully will see some improvement by that time. Electronic Signature(s) Signed: 12/23/2020 6:17:13 PM By: [name] Keeler PA-C Entered By: [name] on 12/23/2020 18:17:13 -------------------------------------------------------------------------------- Physician Orders Details Patient Name: Date of Service: [name] [name], [name] 12/23/2020 8:00 A M Medical Record Number:  [id number] Patient Account Number: [id number] Date of Birth/Sex: Treating RN: 01-31-1956 (65 y.o. [name] Primary Care Provider: Geoffery [name] Other Clinician: Referring Provider: Treating Provider/Extender: [name] A [name] in Treatment: 0 Verbal /  Phone Orders: No Diagnosis Coding Follow-up Appointments ppointment in 1 week. - on Monday with Dr. Heber  Return A ppointment in: - Wed. for initial cast change Return A Cellular or Tissue Based Products Cellular or Tissue Based Product Type: - RUN IVR FOR PURAPLY Bathing/ Shower/ Hygiene May shower with protection but do not get wound dressing(s) wet. Edema Control - Lymphedema / SCD / Other Elevate legs to the level of the heart or above for 30 minutes daily and/or when sitting, a frequency of: Avoid standing for long periods of time. Off-Loading Total Contact Cast to Right Lower Extremity Wound Treatment Wound #1 - Metatarsal head first Wound Laterality: Right Cleanser: Soap and Water 1 x Per Week Discharge Instructions: May shower and wash wound with dial antibacterial soap and water prior to dressing change. Cleanser: Wound Cleanser 1 x Per Week Discharge Instructions: Cleanse the wound with wound cleanser prior to applying a clean dressing using gauze sponges, not tissue or cotton balls. Prim Dressing: KerraCel Ag Gelling Fiber Dressing, 4x5 in (silver alginate) 1 x Per Week ary Discharge Instructions: Apply silver alginate to wound bed as instructed Secondary Dressing: Woven Gauze Sponges 2x2 in 1 x Per Week Discharge Instructions: Apply over primary dressing as directed. Secondary Dressing: Optifoam Non-Adhesive Dressing, 4x4 in 1 x Per Week Discharge Instructions: Apply over primary dressing cut to make foam donut Secured With: 56M [place] Surgical T ape, 4 x 10 (in/yd) 1 x Per Week Discharge Instructions: Secure with tape as directed. Electronic Signature(s) Signed:  12/23/2020 5:50:27 PM By: Baruch Gouty RN, BSN Signed: 12/23/2020 6:26:15 PM By: [name] Keeler PA-C Entered By: Baruch Gouty on 12/23/2020 09:09:32 -------------------------------------------------------------------------------- Problem List Details Patient Name: Date of Service: [name] [name], [name] 12/23/2020 8:00 A M Medical Record Number: [id number] Patient Account Number: [id number] Date of Birth/Sex: Treating RN: 02-Jul-1955 (64 y.o. [name] Primary Care Provider: Geoffery [name] Other Clinician: Referring Provider: Treating Provider/Extender: [name] Filler, [name] in Treatment: 0 Active Problems ICD-10 Encounter Code Description Active Date MDM Diagnosis E11.621 Type 2 diabetes mellitus with foot ulcer 12/17/2020 No Yes L97.512 Non-pressure 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 12/21/2020 No Yes E11.42 Type 2 diabetes mellitus with diabetic polyneuropathy 12/17/2020 No Yes F17.290 Nicotine dependence, other tobacco product, uncomplicated 93/23/5573 No Yes Inactive Problems Resolved Problems Electronic Signature(s) Signed: 12/23/2020 10:05:39 AM By: [name] Keeler PA-C Entered By: [name] on 12/23/2020 10:05:38 -------------------------------------------------------------------------------- Progress Note Details Patient Name: Date of Service: [place], [name] 12/23/2020 8:00 A M Medical Record Number: [id number] Patient Account Number: [id number] Date of Birth/Sex: Treating RN: September 23, 1955 (65 y.o. [name] Primary Care Provider: Geoffery [name] Other Clinician: Referring Provider: Treating Provider/Extender: [name] Filler, [name] in Treatment: 0 Subjective Chief Complaint Information obtained from Patient Right Plantar foot wound History of Present Illness (HPI) Admission 12/17/2020 [name] is a 65 year old female with a past medical  history of insulin-dependent type 2 diabetes, hypothyroidism and daily1 pack per day cigarette smoker the presents to the clinic for a 6-week history of nonhealing wound to the right first MTPJ. She has been following with [name], podiatry for this issue. She has been using silver alginate with dressing changes. She uses a postsurgical shoe and offloading pads. She currently denies signs of infection. 10/31; patient presents for follow-up. She tolerated the soft cast fine although she states that she  felt her foot rolling to one side. She denies signs of infection. She would like to do the total contact cast today. 12/23/2020 upon evaluation today patient appears to be doing excellent in regard to her wound on the foot and she is in a total contact cast. I do think this is appropriate this is the first cast change which we are obliged to do to ensure nothing is rubbing everything appears to be doing quite well and very pleased in that regard. Objective Constitutional Well-nourished and well-hydrated in no acute distress. Vitals Time Taken: 9:05 AM, Height: 67 in, Source: Stated, Weight: 277 lbs, Source: Stated, BMI: 43.4, Temperature: 98.5 F, Pulse: 61 bpm, Respiratory Rate: 18 breaths/min, Blood Pressure: 130/76 mmHg. Respiratory normal breathing without difficulty. Psychiatric this patient is able to make decisions and demonstrates good insight into disease process. Alert and Oriented x 3. pleasant and cooperative. General Notes: I did actually go ahead and reapply the total contact cast today and she tolerated this application without complication. Overall I think she is doing quite well and I am hopeful that this will be beneficial for her she will be seeing Dr. Heber [place] for follow-up next week and to replace the cast hopefully will see some improvement by that time. Integumentary (Hair, Skin) Wound #1 status is Open. Original cause of wound was Gradually Appeared. The date acquired was:  10/29/2020. The wound is located on the Right Metatarsal head first. The wound measures 0.8cm length x 0.4cm width x 0.2cm depth; 0.251cm^2 area and 0.05cm^3 volume. There is Fat Layer (Subcutaneous Tissue) exposed. There is a medium amount of serosanguineous drainage noted. The wound margin is distinct with the outline attached to the wound base. There is large (67-100%) red, pink granulation within the wound bed. There is no necrotic tissue within the wound bed. Assessment Active Problems ICD-10 Type 2 diabetes mellitus with foot ulcer Non-pressure 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 Type 2 diabetes mellitus with diabetic polyneuropathy Nicotine dependence, other tobacco product, uncomplicated Procedures Wound #1 Pre-procedure diagnosis of Wound #1 is a Diabetic Wound/Ulcer of the Lower Extremity located on the Right Metatarsal head first . There was a T Contact otal Cast Procedure by [name] Keeler, PA. Post procedure Diagnosis Wound #1: Same as Pre-Procedure Plan Follow-up Appointments: Return Appointment in 1 week. - on Monday with Dr. Heber [place] Return Appointment in: - Wed. for initial cast change Cellular or Tissue Based Products: Cellular or Tissue Based Product Type: - RUN IVR FOR PURAPLY Bathing/ Shower/ Hygiene: May shower with protection but do not get wound dressing(s) wet. Edema Control - Lymphedema / SCD / Other: Elevate legs to the level of the heart or above for 30 minutes daily and/or when sitting, a frequency of: Avoid standing for long periods of time. Off-Loading: T Contact Cast to Right Lower Extremity otal WOUND #1: - Metatarsal head first Wound Laterality: Right Cleanser: Soap and Water 1 x Per Week/ Discharge Instructions: May shower and wash wound with dial antibacterial soap and water prior to dressing change. Cleanser: Wound Cleanser 1 x Per Week/ Discharge Instructions: Cleanse the wound with wound cleanser prior to applying a  clean dressing using gauze sponges, not tissue or cotton balls. Prim Dressing: KerraCel Ag Gelling Fiber Dressing, 4x5 in (silver alginate) 1 x Per Week/ ary Discharge Instructions: Apply silver alginate to wound bed as instructed Secondary Dressing: Woven Gauze Sponges 2x2 in 1 x Per Week/ Discharge Instructions: Apply over primary dressing as directed. Secondary  Dressing: Optifoam Non-Adhesive Dressing, 4x4 in 1 x Per Week/ Discharge Instructions: Apply over primary dressing cut to make foam donut Secured With: 47M Medipore H Soft Cloth Surgical T ape, 4 x 10 (in/yd) 1 x Per Week/ Discharge Instructions: Secure with tape as directed. 1. Would recommend currently that we continue with a total contact cast I did reapply that myself today. 2. I am also can recommend that we have the patient continue to monitor for any signs of worsening such as increased pain if she has any issues she is to let us know soon as possible sooner the better obviously we do not want this to cause any complication for her. We will see patient back for reevaluation in 1 week here in the clinic. If anything worsens or changes patient will contact our office for additional recommendations. Electronic Signature(s) Signed: 12/23/2020 6:17:33 PM By: [name] Keeler PA-C Entered By: [name] on 12/23/2020 18:17:33 -------------------------------------------------------------------------------- Total Contact Cast Details Patient Name: Date of Service: [name] [name], [name] 12/23/2020 8:00 A M Medical Record Number: [id number] Patient Account Number: [id number] Date of Birth/Sex: Treating RN: [birth year] (65 y.o. [name] Primary Care Provider: Geoffery [name] Other Clinician: Referring Provider: Treating Provider/Extender: [name] A [name] in Treatment: 0 T Contact Cast Applied for Wound Assessment: otal Wound #1 Right Metatarsal head first Performed By: Physician [name] Keeler, PA Post Procedure Diagnosis Same as Pre-procedure Electronic Signature(s) Signed: 12/23/2020 5:50:27 PM By: Baruch Gouty RN, BSN Signed: 12/23/2020 6:26:15 PM By: [name] Keeler PA-C Entered By: Baruch Gouty on 12/23/2020 09:08:31 -------------------------------------------------------------------------------- SuperBill Details Patient Name: Date of Service: [name] [name], [name] 12/23/2020 Medical Record Number: [id number] Patient Account Number: [id number] Date of Birth/Sex: Treating RN: 1956/01/09 (65 y.o. [name] Primary Care Provider: Geoffery [name] Other Clinician: Referring Provider: Treating Provider/Extender: [name] A [name] in Treatment: 0 Diagnosis Coding ICD-10 Codes Code Description [phone] Type 2 diabetes mellitus with foot ulcer E11.42 Type 2 diabetes mellitus with diabetic polyneuropathy F17.290 Nicotine dependence, other tobacco product, uncomplicated Facility Procedures CPT4 Code: [id number] Description: 29445 - APPLY TOTAL CONTACT LEG CAST ICD-10 Diagnosis Description E11.621 Type 2 diabetes mellitus with foot ulcer Modifier: Quantity: 1 Physician Procedures : CPT4 Code Description Modifier [id number] - WC PHYS APPLY TOTAL CONTACT CAST ICD-10 Diagnosis Description E11.621 Type 2 diabetes mellitus with foot ulcer Quantity: 1 Electronic Signature(s) Signed: 12/23/2020 6:18:01 PM By: [name] Keeler PA-C Previous Signature: 12/23/2020 5:50:27 PM Version By: Baruch Gouty RN, BSN Entered By: [name] on 12/23/2020 18:18:01

## 2020-12-28 ENCOUNTER — Other Ambulatory Visit: Payer: Self-pay

## 2020-12-28 ENCOUNTER — Encounter (HOSPITAL_BASED_OUTPATIENT_CLINIC_OR_DEPARTMENT_OTHER): Payer: Medicare Other | Admitting: Internal Medicine

## 2020-12-28 DIAGNOSIS — L97512 Non-pressure 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: Secondary | ICD-10-CM

## 2020-12-28 DIAGNOSIS — E11621 Type 2 diabetes mellitus with foot ulcer: Secondary | ICD-10-CM

## 2020-12-28 DIAGNOSIS — E1142 Type 2 diabetes mellitus with diabetic polyneuropathy: Secondary | ICD-10-CM | POA: Diagnosis not present

## 2020-12-28 DIAGNOSIS — F17210 Nicotine dependence, cigarettes, uncomplicated: Secondary | ICD-10-CM | POA: Diagnosis not present

## 2020-12-28 DIAGNOSIS — L97511 Non-pressure chronic ulcer of other part of right foot limited to breakdown of skin: Secondary | ICD-10-CM | POA: Diagnosis not present

## 2020-12-28 DIAGNOSIS — F17290 Nicotine dependence, other tobacco product, uncomplicated: Secondary | ICD-10-CM | POA: Diagnosis not present

## 2020-12-28 DIAGNOSIS — Z794 Long term (current) use of insulin: Secondary | ICD-10-CM | POA: Diagnosis not present

## 2020-12-28 NOTE — Progress Notes (Signed)
[name], [name] ([id number]) Visit Report for 12/28/2020 Arrival Information Details Patient Name: Date of Service: [name], [name] 12/28/2020 8:15 A M Medical Record Number: [id number] Patient Account Number: [id number] Date of Birth/Sex: Treating RN: 1955-12-04 (65 y.o. [name] Primary Care Yossi Hinchman: [name] Other Clinician: Referring Cathryn Gallery: Treating Donzell Coller/Extender: [name] in Treatment: 1 Visit Information History Since Last Visit Added or deleted any medications: No Patient Arrived: Ambulatory Any new allergies or adverse reactions: No Arrival Time: 08:15 Had a fall or experienced change in No Accompanied By: self activities of daily living that may affect Transfer Assistance: None risk of falls: Patient Identification Verified: Yes Signs or symptoms of abuse/neglect since last visito No Secondary Verification Process Completed: Yes Hospitalized since last visit: No Patient Requires Transmission-Based Precautions: No Implantable device outside of the clinic excluding No Patient Has Alerts: No cellular tissue based products placed in the center since last visit: Has Dressing in Place as Prescribed: Yes Pain Present Now: No Electronic Signature(s) Signed: 12/28/2020 3:53:30 PM By: Sandre Kitty Entered By: Sandre Kitty on 12/28/2020 08:16:25 -------------------------------------------------------------------------------- Encounter Discharge Information Details Patient Name: Date of Service: [name], [name] 12/28/2020 8:15 A M Medical Record Number: [id number] Patient Account Number: [id number] Date of Birth/Sex: Treating RN: October 29, 1955 (64 y.o. [name] Primary Care Jancie Kercher: [name] Other Clinician: Referring Lyssa Hackley: Treating Trenell Concannon/Extender: [name] in Treatment: 1 Encounter Discharge Information Items Discharge Condition:  Stable Ambulatory Status: Ambulatory Discharge Destination: Home Transportation: Private Auto Schedule Follow-up Appointment: Yes Clinical Summary of Care: Provided on 12/28/2020 Form Type Recipient Paper Patient Patient Electronic Signature(s) Signed: 12/28/2020 3:53:30 PM By: Sandre Kitty Entered By: Sandre Kitty on 12/28/2020 09:18:54 -------------------------------------------------------------------------------- Lower Extremity Assessment Details Patient Name: Date of Service: [name], [name] 12/28/2020 8:15 A M Medical Record Number: [id number] Patient Account Number: [id number] Date of Birth/Sex: Treating RN: [birth year] (65 y.o. [name] Primary Care Sheneka Schrom: [name] Other Clinician: Referring Christiaan Strebeck: Treating Jakaiden Fill/Extender: Glenna Fellows Weeks in Treatment: 1 Edema Assessment Assessed: [Left: No] [Right: Yes] Edema: [Left: Ye] [Right: s] Calf Left: Right: Point of Measurement: 32 cm From Medial Instep 46.5 cm Ankle Left: Right: Point of Measurement: 8 cm From Medial Instep 25 cm Vascular Assessment Pulses: Dorsalis Pedis Palpable: [Right:Yes] Electronic Signature(s) Signed: 12/28/2020 3:53:30 PM By: Sandre Kitty Signed: 12/28/2020 5:20:47 PM By: Lorrin Jackson Entered By: Sandre Kitty on 12/28/2020 08:27:39 -------------------------------------------------------------------------------- Multi Wound Chart Details Patient Name: Date of Service: [name], [name] 12/28/2020 8:15 A M Medical Record Number: [id number] Patient Account Number: [id number] Date of Birth/Sex: Treating RN: Feb 09, 1956 (65 y.o. [name] Primary Care Stephanie Mcglone: [name] Other Clinician: Referring Steel Kerney: Treating Dacoda Finlay/Extender: [name] in Treatment: 1 Vital Signs Height(in): 44 Pulse(bpm): 34 Weight(lbs): 277 Blood Pressure(mmHg): 148/74 Body Mass  Index(BMI): 43 Temperature(F): 98.3 Respiratory Rate(breaths/min): 18 Photos: [1:Right Metatarsal head first] [N/A:N/A N/A] Wound Location: [1:Gradually Appeared] [N/A:N/A] Wounding Event: [1:Diabetic Wound/Ulcer of the Lower] [N/A:N/A] Primary Etiology: [1:Extremity Sleep Apnea, Coronary Artery] [N/A:N/A] Comorbid History: [1:Disease, Hypertension, Type II Diabetes, Neuropathy 10/29/2020] [N/A:N/A] Date Acquired: [1:1] [N/A:N/A] Weeks of Treatment: [1:Open] [N/A:N/A] Wound Status: [1:0.8x0.6x0.4] [N/A:N/A] Measurements L x W x D (cm) [1:0.377] [N/A:N/A] A (cm) : rea [1:0.151] [N/A:N/A] Volume (cm) : [1:11.10%] [N/A:N/A] % Reduction in A rea: [1:-259.50%] [N/A:N/A] % Reduction in Volume: [1:Grade 1] [N/A:N/A] Classification: [1:Medium] [N/A:N/A]  Exudate A mount: [1:Serosanguineous] [N/A:N/A] Exudate Type: [1:red, brown] [N/A:N/A] Exudate Color: [1:Distinct, outline attached] [N/A:N/A] Wound Margin: [1:Large (67-100%)] [N/A:N/A] Granulation A mount: [1:Red, Pink] [N/A:N/A] Granulation Quality: [1:None Present (0%)] [N/A:N/A] Necrotic A mount: [1:Fat Layer (Subcutaneous Tissue): Yes N/A] Exposed Structures: [1:Fascia: No Tendon: No Muscle: No Joint: No Bone: No None] [N/A:N/A] Epithelialization: [1:maceration noted] [N/A:N/A] Assessment Notes: [1:T Contact Cast otal] [N/A:N/A] Treatment Notes Wound #1 (Metatarsal head first) Wound Laterality: Right Cleanser Soap and Water Discharge Instruction: May shower and wash wound with dial antibacterial soap and water prior to dressing change. Wound Cleanser Discharge Instruction: Cleanse the wound with wound cleanser prior to applying a clean dressing using gauze sponges, not tissue or cotton balls. Peri-Wound Care Topical Primary Dressing KerraCel Ag Gelling Fiber Dressing, 4x5 in (silver alginate) Discharge Instruction: Apply silver alginate to wound bed as instructed Secondary Dressing ABD Pad, 5x9 Discharge Instruction: Apply  over primary dressing as directed. Drawtex 4x4 in Discharge Instruction: Apply over primary dressing as directed. Secured With 53M Medipore H Soft Cloth Surgical T ape, 4 x 10 (in/yd) Discharge Instruction: Secure with tape as directed. Compression Wrap Compression Stockings Add-Ons Electronic Signature(s) Signed: 12/28/2020 10:37:55 AM By: Kalman Shan DO Signed: 12/28/2020 5:20:47 PM By: Lorrin Jackson Entered By: Kalman Shan on 12/28/2020 10:30:20 -------------------------------------------------------------------------------- Multi-Disciplinary Care Plan Details Patient Name: Date of Service: [name], [name] 12/28/2020 8:15 A M Medical Record Number: [id number] Patient Account Number: [id number] Date of Birth/Sex: Treating RN: 1955/11/27 (65 y.o. [name] Primary Care Kataleyah Carducci: [name] Other Clinician: Referring Malan Werk: Treating Nayeliz Hipp/Extender: [name] in Treatment: 1 Active Inactive Nutrition Nursing Diagnoses: Impaired glucose control: actual or potential Goals: Patient/caregiver verbalizes understanding of need to maintain therapeutic glucose control per primary care physician Date Initiated: 12/17/2020 Target Resolution Date: 01/21/2021 Goal Status: Active Interventions: Assess HgA1c results as ordered upon admission and as needed Assess patient nutrition upon admission and as needed per policy Provide education on elevated blood sugars and impact on wound healing Notes: Wound/Skin Impairment Nursing Diagnoses: Impaired tissue integrity Goals: Patient/caregiver will verbalize understanding of skin care regimen Date Initiated: 12/17/2020 Target Resolution Date: 01/21/2021 Goal Status: Active Ulcer/skin breakdown will have a volume reduction of 30% by week 4 Date Initiated: 12/17/2020 Target Resolution Date: 01/21/2021 Goal Status: Active Interventions: Assess patient/caregiver ability to  obtain necessary supplies Assess patient/caregiver ability to perform ulcer/skin care regimen upon admission and as needed Assess ulceration(s) every visit Provide education on ulcer and skin care Treatment Activities: Topical wound management initiated : 12/17/2020 Notes: Electronic Signature(s) Signed: 12/28/2020 8:24:39 AM By: Lorrin Jackson Entered By: Lorrin Jackson on 12/28/2020 08:24:39 -------------------------------------------------------------------------------- Pain Assessment Details Patient Name: Date of Service: [name], [name] 12/28/2020 8:15 A M Medical Record Number: [id number] Patient Account Number: [id number] Date of Birth/Sex: Treating RN: 05-29-1955 (65 y.o. [name] Primary Care Shavy Beachem: [name] Other Clinician: Referring Robbi Spells: Treating Columbia Pandey/Extender: [name] in Treatment: 1 Active Problems Location of Pain Severity and Description of Pain Patient Has Paino No Site Locations Pain Management and Medication Current Pain Management: Electronic Signature(s) Signed: 12/28/2020 3:53:30 PM By: Sandre Kitty Signed: 12/28/2020 5:20:47 PM By: Lorrin Jackson Entered By: Sandre Kitty on 12/28/2020 08:16:45 -------------------------------------------------------------------------------- Patient/Caregiver Education Details Patient Name: Date of Service: [name], [place] 11/7/2022andnbsp8:15 [place] Record Number: [id number] Patient Account Number: [id number] Date of Birth/Gender: Treating RN: [birth year] (65 y.o. [name] Primary Care  Physician: [name] Other Clinician: Referring Physician: Treating Physician/Extender: [name] in Treatment: 1 Education Assessment Education Provided To: Patient Education Topics Provided Offloading: Methods: Explain/Verbal, Printed Responses: State content correctly Wound/Skin  Impairment: Methods: Demonstration, Explain/Verbal, Printed Responses: State content correctly Electronic Signature(s) Signed: 12/28/2020 5:20:47 PM By: Lorrin Jackson Entered By: Lorrin Jackson on 12/28/2020 08:25:01 -------------------------------------------------------------------------------- Wound Assessment Details Patient Name: Date of Service: [name], [name] 12/28/2020 8:15 A M Medical Record Number: [id number] Patient Account Number: [id number] Date of Birth/Sex: Treating RN: [birth year] (65 y.o. [name] Primary Care Koran Seabrook: [name] Other Clinician: Referring Anjelita Sheahan: Treating Thaison Kolodziejski/Extender: [name] in Treatment: 1 Wound Status Wound Number: 1 Primary Diabetic Wound/Ulcer of the Lower Extremity Etiology: Wound Location: Right Metatarsal head first Wound Open Wounding Event: Gradually Appeared Status: Date Acquired: 10/29/2020 Comorbid Sleep Apnea, Coronary Artery Disease, Hypertension, Type II Weeks Of Treatment: 1 History: Diabetes, Neuropathy Clustered Wound: No Photos Wound Measurements Length: (cm) 0.8 Width: (cm) 0.6 Depth: (cm) 0.4 Area: (cm) 0.377 Volume: (cm) 0.151 % Reduction in Area: 11.1% % Reduction in Volume: -259.5% Epithelialization: None Tunneling: No Undermining: No Wound Description Classification: Grade 1 Wound Margin: Distinct, outline attached Exudate Amount: Medium Exudate Type: Serosanguineous Exudate Color: red, brown Foul Odor After Cleansing: No Slough/Fibrino Yes Wound Bed Granulation Amount: Large (67-100%) Exposed Structure Granulation Quality: Red, Pink Fascia Exposed: No Necrotic Amount: None Present (0%) Fat Layer (Subcutaneous Tissue) Exposed: Yes Tendon Exposed: No Muscle Exposed: No Joint Exposed: No Bone Exposed: No Assessment Notes maceration noted Treatment Notes Wound #1 (Metatarsal head first) Wound Laterality: Right Cleanser Soap and  Water Discharge Instruction: May shower and wash wound with dial antibacterial soap and water prior to dressing change. Wound Cleanser Discharge Instruction: Cleanse the wound with wound cleanser prior to applying a clean dressing using gauze sponges, not tissue or cotton balls. Peri-Wound Care Topical Primary Dressing KerraCel Ag Gelling Fiber Dressing, 4x5 in (silver alginate) Discharge Instruction: Apply silver alginate to wound bed as instructed Secondary Dressing ABD Pad, 5x9 Discharge Instruction: Apply over primary dressing as directed. Drawtex 4x4 in Discharge Instruction: Apply over primary dressing as directed. Secured With 78M Medipore H Soft Cloth Surgical T ape, 4 x 10 (in/yd) Discharge Instruction: Secure with tape as directed. Compression Wrap Compression Stockings Add-Ons Electronic Signature(s) Signed: 12/28/2020 3:53:30 PM By: Sandre Kitty Signed: 12/28/2020 5:20:47 PM By: Lorrin Jackson Entered By: Sandre Kitty on 12/28/2020 08:28:16 -------------------------------------------------------------------------------- [place] Details Patient Name: Date of Service: [name], [name] 12/28/2020 8:15 A M Medical Record Number: [id number] Patient Account Number: [id number] Date of Birth/Sex: Treating RN: [birth year] (65 y.o. [name] Primary Care Jyra Lagares: [name] Other Clinician: Referring Jalyah Weinheimer: Treating Alicja Everitt/Extender: [name] in Treatment: 1 Vital Signs Time Taken: 08:16 Temperature (F): 98.3 Height (in): 67 Pulse (bpm): 82 Weight (lbs): 277 Respiratory Rate (breaths/min): 18 Body Mass Index (BMI): 43.4 Blood Pressure (mmHg): 148/74 Reference Range: 80 - 120 mg / dl Electronic Signature(s) Signed: 12/28/2020 3:53:30 PM By: Sandre Kitty Entered By: Sandre Kitty on 12/28/2020 08:16:39

## 2020-12-28 NOTE — Progress Notes (Signed)
[name], [name] ([id number]) Visit Report for 12/28/2020 Chief Complaint Document Details Patient Name: Date of Service: [name] [name] 12/28/2020 8:15 A M Medical Record Number: [id number] Patient Account Number: [id number] Date of Birth/Sex: Treating RN: Nov 17, 1955 (65 y.o. [name] Primary Care Provider: Geoffery [name] Other Clinician: Referring Provider: Treating Provider/Extender: [name] in Treatment: 1 Information Obtained from: Patient Chief Complaint Right Plantar foot wound Electronic Signature(s) Signed: 12/28/2020 10:37:55 AM By: [name] Shan DO Entered By: [name] on 12/28/2020 10:30:32 -------------------------------------------------------------------------------- HPI Details Patient Name: Date of Service: [name] [name], [name] 12/28/2020 8:15 A M Medical Record Number: [id number] Patient Account Number: [id number] Date of Birth/Sex: Treating RN: 1955/07/12 (64 y.o. [name] Primary Care Provider: Geoffery [name] Other Clinician: Referring Provider: Treating Provider/Extender: [name] in Treatment: 1 History of Present Illness HPI Description: Admission 12/17/2020 [name] is a 65 year old female with a past medical history of insulin-dependent type 2 diabetes, hypothyroidism and daily1 pack per day cigarette smoker the presents to the clinic for a 6-week history of nonhealing wound to the right first MTPJ. She has been following with [name], podiatry for this issue. She has been using silver alginate with dressing changes. She uses a postsurgical shoe and offloading pads. She currently denies signs of infection. 10/31; patient presents for follow-up. She tolerated the soft cast fine although she states that she felt her foot rolling to one side. She denies signs of infection. She would like to do the total contact cast  today. 12/23/2020 upon evaluation today patient appears to be doing excellent in regard to her wound on the foot and she is in a total contact cast. I do think this is appropriate this is the first cast change which we are obliged to do to ensure nothing is rubbing everything appears to be doing quite well and very pleased in that regard. 11/7; patient presents for follow-up. She had no issues with the cast. She denies signs of infection. Electronic Signature(s) Signed: 12/28/2020 10:37:55 AM By: [name] Shan DO Entered By: [name] on 12/28/2020 10:32:13 -------------------------------------------------------------------------------- Physical Exam Details Patient Name: Date of Service: [name] [name], [name] 12/28/2020 8:15 A M Medical Record Number: [id number] Patient Account Number: [id number] Date of Birth/Sex: Treating RN: Dec 18, 1955 (65 y.o. [name] Primary Care Provider: Geoffery [name] Other Clinician: Referring Provider: Treating Provider/Extender: [name] in Treatment: 1 Constitutional respirations regular, non-labored and within target range for patient.. Cardiovascular 2+ dorsalis pedis/posterior tibialis pulses. Psychiatric pleasant and cooperative. Notes Right first met head with a punched out open wound with granulation tissue and minimal circumferential undermining. No signs of infection. The periwound is macerated. Electronic Signature(s) Signed: 12/28/2020 10:37:55 AM By: [name] Shan DO Entered By: [name] on 12/28/2020 10:33:17 -------------------------------------------------------------------------------- Physician Orders Details Patient Name: Date of Service: [name] [name], [name] 12/28/2020 8:15 A M Medical Record Number: [id number] Patient Account Number: [id number] Date of Birth/Sex: Treating RN: 1955/08/23 (65 y.o. [name] Primary Care Provider: Geoffery [name] Other  Clinician: Referring Provider: Treating Provider/Extender: [name] in Treatment: 1 Verbal / Phone Orders: No Diagnosis Coding ICD-10 Coding Code Description E11.621 Type 2 diabetes mellitus with foot ulcer L97.512 Non-pressure 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 E11.42 Type 2 diabetes mellitus with diabetic polyneuropathy F17.290 Nicotine dependence, other tobacco product,  uncomplicated Follow-up Appointments ppointment in 1 week. - on Monday with [name] Return A Cellular or Tissue Based Products Cellular or Tissue Based Product Type: - RUN IVR FOR PURAPLY Bathing/ Shower/ Hygiene May shower with protection but do not get wound dressing(s) wet. Edema Control - Lymphedema / SCD / Other Elevate legs to the level of the heart or above for 30 minutes daily and/or when sitting, a frequency of: Avoid standing for long periods of time. Off-Loading Total Contact Cast to Right Lower Extremity Wound Treatment Wound #1 - Metatarsal head first Wound Laterality: Right Cleanser: Soap and Water 1 x Per Week Discharge Instructions: May shower and wash wound with dial antibacterial soap and water prior to dressing change. Cleanser: Wound Cleanser 1 x Per Week Discharge Instructions: Cleanse the wound with wound cleanser prior to applying a clean dressing using gauze sponges, not tissue or cotton balls. Prim Dressing: KerraCel Ag Gelling Fiber Dressing, 4x5 in (silver alginate) 1 x Per Week ary Discharge Instructions: Apply silver alginate to wound bed as instructed Secondary Dressing: ABD Pad, 5x9 1 x Per Week Discharge Instructions: Apply over primary dressing as directed. Secondary Dressing: Drawtex 4x4 in 1 x Per Week Discharge Instructions: Apply over primary dressing as directed. Secured With: 45M Medipore H Soft Cloth Surgical T ape, 4 x 10 (in/yd) 1 x Per Week Discharge Instructions: Secure with tape as  directed. Electronic Signature(s) Signed: 12/28/2020 10:37:55 AM By: [name] Shan DO Entered By: [name] on 12/28/2020 10:33:30 -------------------------------------------------------------------------------- Problem List Details Patient Name: Date of Service: [name] [name], [name] 12/28/2020 8:15 A M Medical Record Number: [id number] Patient Account Number: [id number] Date of Birth/Sex: Treating RN: 1955-08-24 (65 y.o. [name] Primary Care Provider: Geoffery [name] Other Clinician: Referring Provider: Treating Provider/Extender: [name] in Treatment: 1 Active Problems ICD-10 Encounter Code Description Active Date MDM Diagnosis E11.621 Type 2 diabetes mellitus with foot ulcer 12/17/2020 No Yes L97.512 Non-pressure 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 12/21/2020 No Yes E11.42 Type 2 diabetes mellitus with diabetic polyneuropathy 12/17/2020 No Yes F17.290 Nicotine dependence, other tobacco product, uncomplicated 47/10/6281 No Yes Inactive Problems Resolved Problems Electronic Signature(s) Signed: 12/28/2020 10:37:55 AM By: [name] Shan DO Previous Signature: 12/28/2020 8:24:26 AM Version By: Lorrin Jackson Entered By: [name] on 12/28/2020 10:30:09 -------------------------------------------------------------------------------- Progress Note Details Patient Name: Date of Service: [name] [name], [name] 12/28/2020 8:15 A M Medical Record Number: [id number] Patient Account Number: [id number] Date of Birth/Sex: Treating RN: 1955-10-02 (65 y.o. [name] Primary Care Provider: Geoffery [name] Other Clinician: Referring Provider: Treating Provider/Extender: [name] in Treatment: 1 Subjective Chief Complaint Information obtained from Patient Right Plantar foot wound History of Present Illness (HPI) Admission 12/17/2020 Ms. Payden Bonus is a 65 year old female with a past medical history of insulin-dependent type 2 diabetes, hypothyroidism and daily1 pack per day cigarette smoker the presents to the clinic for a 6-week history of nonhealing wound to the right first MTPJ. She has been following with [name], podiatry for this issue. She has been using silver alginate with dressing changes. She uses a postsurgical shoe and offloading pads. She currently denies signs of infection. 10/31; patient presents for follow-up. She tolerated the soft cast fine although she states that she felt her foot rolling to one side. She denies signs of infection. She would like to do the total contact cast today. 12/23/2020 upon evaluation today  patient appears to be doing excellent in regard to her wound on the foot and she is in a total contact cast. I do think this is appropriate this is the first cast change which we are obliged to do to ensure nothing is rubbing everything appears to be doing quite well and very pleased in that regard. 11/7; patient presents for follow-up. She had no issues with the cast. She denies signs of infection. Patient History Information obtained from Patient, Chart. Family History Unknown History. Social History Current every day smoker - 1 pack/a/day, Alcohol Use - Never, Drug Use - No History, Caffeine Use - Moderate. Medical History Eyes Denies history of Cataracts, Glaucoma, Optic Neuritis Ear/Nose/Mouth/Throat Denies history of Chronic sinus problems/congestion, Middle ear problems Hematologic/Lymphatic Denies history of Anemia, Hemophilia, Human Immunodeficiency Virus, Lymphedema, Sickle Cell Disease Respiratory Patient has history of Sleep Apnea Denies history of Aspiration, Asthma, Chronic Obstructive Pulmonary Disease (COPD), Pneumothorax, Tuberculosis Cardiovascular Patient has history of Coronary Artery Disease, Hypertension Denies history of Angina, Arrhythmia, Congestive Heart Failure,  Deep Vein Thrombosis, Hypotension, Myocardial Infarction, Peripheral Arterial Disease, Peripheral Venous Disease, Phlebitis, Vasculitis Gastrointestinal Denies history of Cirrhosis , Colitis, Crohnoos, Hepatitis A, Hepatitis B, Hepatitis C Endocrine Patient has history of Type II Diabetes Denies history of Type I Diabetes Genitourinary Denies history of End Stage Renal Disease Immunological Denies history of Lupus Erythematosus, Raynaudoos, Scleroderma Integumentary (Skin) Denies history of History of Burn Musculoskeletal Denies history of Gout, Rheumatoid Arthritis, Osteoarthritis, Osteomyelitis Neurologic Patient has history of Neuropathy Denies history of Dementia, Quadriplegia, Paraplegia, Seizure Disorder Hospitalization/Surgery History - 2nd and 3rd right toe amps. - total hysterectomy. - cholecystectomy. - 2 knee surgery's on left and 1 on right. Medical A Surgical History Notes nd Cardiovascular hypercholesterolemia Objective Constitutional respirations regular, non-labored and within target range for patient.. Vitals Time Taken: 8:16 AM, Height: 67 in, Weight: 277 lbs, BMI: 43.4, Temperature: 98.3 F, Pulse: 82 bpm, Respiratory Rate: 18 breaths/min, Blood Pressure: 148/74 mmHg. Cardiovascular 2+ dorsalis pedis/posterior tibialis pulses. Psychiatric pleasant and cooperative. General Notes: Right first met head with a punched out open wound with granulation tissue and minimal circumferential undermining. No signs of infection. The periwound is macerated. Integumentary (Hair, Skin) Wound #1 status is Open. Original cause of wound was Gradually Appeared. The date acquired was: 10/29/2020. The wound has been in treatment 1 weeks. The wound is located on the Right Metatarsal head first. The wound measures 0.8cm length x 0.6cm width x 0.4cm depth; 0.377cm^2 area and 0.151cm^3 volume. There is Fat Layer (Subcutaneous Tissue) exposed. There is no tunneling or undermining noted.  There is a medium amount of serosanguineous drainage noted. The wound margin is distinct with the outline attached to the wound base. There is large (67-100%) red, pink granulation within the wound bed. There is no necrotic tissue within the wound bed. General Notes: maceration noted Assessment Active Problems ICD-10 Type 2 diabetes mellitus with foot ulcer Non-pressure 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 Type 2 diabetes mellitus with diabetic polyneuropathy Nicotine dependence, other tobacco product, uncomplicated Patient's wound is stable. The depth on admission was closer to 0.3cm and today is 0.4cm. Overall tissue looks healthy. There is maceration noted today and she had gauze placed as secondary dressing in her cast. At this time I will change to drawtex as she needs something more absorbent. No signs of infection. Cast was replaced today. Procedures Wound #1 Pre-procedure diagnosis of Wound #1 is a Diabetic Wound/Ulcer of the Lower Extremity  located on the Right Metatarsal head first . There was a T Contact otal Cast Procedure by [name] Shan, DO. Post procedure Diagnosis Wound #1: Same as Pre-Procedure Plan Follow-up Appointments: Return Appointment in 1 week. - on Monday with Dr. Heber Highmore Cellular or Tissue Based Products: Cellular or Tissue Based Product Type: - RUN IVR FOR PURAPLY Bathing/ Shower/ Hygiene: May shower with protection but do not get wound dressing(s) wet. Edema Control - Lymphedema / SCD / Other: Elevate legs to the level of the heart or above for 30 minutes daily and/or when sitting, a frequency of: Avoid standing for long periods of time. Off-Loading: T Contact Cast to Right Lower Extremity otal WOUND #1: - Metatarsal head first Wound Laterality: Right Cleanser: Soap and Water 1 x Per Week/ Discharge Instructions: May shower and wash wound with dial antibacterial soap and water prior to dressing change. Cleanser: Wound  Cleanser 1 x Per Week/ Discharge Instructions: Cleanse the wound with wound cleanser prior to applying a clean dressing using gauze sponges, not tissue or cotton balls. Prim Dressing: KerraCel Ag Gelling Fiber Dressing, 4x5 in (silver alginate) 1 x Per Week/ ary Discharge Instructions: Apply silver alginate to wound bed as instructed Secondary Dressing: ABD Pad, 5x9 1 x Per Week/ Discharge Instructions: Apply over primary dressing as directed. Secondary Dressing: Drawtex 4x4 in 1 x Per Week/ Discharge Instructions: Apply over primary dressing as directed. Secured With: 3M Medipore H Soft Cloth Surgical T ape, 4 x 10 (in/yd) 1 x Per Week/ Discharge Instructions: Secure with tape as directed. 1. Cast placed in standard fashion 2. Awaiting insurance approval for skin substitute 3. Silver alginate with drawtex 4. Follow-up in 1 week Electronic Signature(s) Signed: 12/28/2020 10:37:55 AM By: [name] Shan DO Entered By: [name] on 12/28/2020 10:37:06 -------------------------------------------------------------------------------- HxROS Details Patient Name: Date of Service: [name] [name], [name] 12/28/2020 8:15 A M Medical Record Number: [id number] Patient Account Number: [id number] Date of Birth/Sex: Treating RN: May 26, 1955 (65 y.o. [name] Primary Care Provider: Geoffery [name] Other Clinician: Referring Provider: Treating Provider/Extender: [name] in Treatment: 1 Information Obtained From Patient Chart Eyes Medical History: Negative for: Cataracts; Glaucoma; Optic Neuritis Ear/Nose/Mouth/Throat Medical History: Negative for: Chronic sinus problems/congestion; Middle ear problems Hematologic/Lymphatic Medical History: Negative for: Anemia; Hemophilia; Human Immunodeficiency Virus; Lymphedema; Sickle Cell Disease Respiratory Medical History: Positive for: Sleep Apnea Negative for: Aspiration; Asthma; Chronic  Obstructive Pulmonary Disease (COPD); Pneumothorax; Tuberculosis Cardiovascular Medical History: Positive for: Coronary Artery Disease; Hypertension Negative for: Angina; Arrhythmia; Congestive Heart Failure; Deep Vein Thrombosis; Hypotension; Myocardial Infarction; Peripheral Arterial Disease; Peripheral Venous Disease; Phlebitis; Vasculitis Past Medical History Notes: hypercholesterolemia Gastrointestinal Medical History: Negative for: Cirrhosis ; Colitis; Crohns; Hepatitis A; Hepatitis B; Hepatitis C Endocrine Medical History: Positive for: Type II Diabetes Negative for: Type I Diabetes Genitourinary Medical History: Negative for: End Stage Renal Disease Immunological Medical History: Negative for: Lupus Erythematosus; Raynauds; Scleroderma Integumentary (Skin) Medical History: Negative for: History of Burn Musculoskeletal Medical History: Negative for: Gout; Rheumatoid Arthritis; Osteoarthritis; Osteomyelitis Neurologic Medical History: Positive for: Neuropathy Negative for: Dementia; Quadriplegia; Paraplegia; Seizure Disorder Immunizations Pneumococcal Vaccine: Received Pneumococcal Vaccination: Yes Received Pneumococcal Vaccination On or After 60th Birthday: Yes Tetanus Vaccine: Last tetanus shot: 12/17/2020 Implantable Devices No devices added Hospitalization / Surgery History Type of Hospitalization/Surgery 2nd and 3rd right toe amps total hysterectomy cholecystectomy 2 knee surgery's on left and 1 on right Family and Social History Unknown History: Yes; Current every day smoker - 1 pack/a/day;  Alcohol Use: Never; Drug Use: No History; Caffeine Use: Moderate; Financial Concerns: No; Food, Clothing or Shelter Needs: No; Support System Lacking: No; Transportation Concerns: No Electronic Signature(s) Signed: 12/28/2020 10:37:55 AM By: [name] Shan DO Signed: 12/28/2020 5:20:47 PM By: Lorrin Jackson Entered By: [name] on 12/28/2020  10:32:22 -------------------------------------------------------------------------------- Total Contact Cast Details Patient Name: Date of Service: [name] [name], [name] 12/28/2020 8:15 A M Medical Record Number: [id number] Patient Account Number: [id number] Date of Birth/Sex: Treating RN: 29-Jun-1955 (65 y.o. [name] Primary Care Provider: Geoffery [name] Other Clinician: Referring Provider: Treating Provider/Extender: [name] in Treatment: 1 T Contact Cast Applied for Wound Assessment: otal Wound #1 Right Metatarsal head first Performed By: Physician [name] Shan, DO Post Procedure Diagnosis Same as Pre-procedure Electronic Signature(s) Signed: 12/28/2020 10:37:55 AM By: [name] Shan DO Signed: 12/28/2020 3:53:30 PM By: Sandre Kitty Entered By: Sandre Kitty on 12/28/2020 08:58:51 -------------------------------------------------------------------------------- [name] Details Patient Name: Date of Service: [name] [name], [name] 12/28/2020 Medical Record Number: [id number] Patient Account Number: [id number] Date of Birth/Sex: Treating RN: [birth year] (65 y.o. [name] Primary Care Provider: Geoffery [name] Other Clinician: Referring Provider: Treating Provider/Extender: [name] in Treatment: 1 Diagnosis Coding ICD-10 Codes Code Description [phone] Type 2 diabetes mellitus with foot ulcer L97.512 Non-pressure 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 E11.42 Type 2 diabetes mellitus with diabetic polyneuropathy F17.290 Nicotine dependence, other tobacco product, uncomplicated Facility Procedures CPT4 Code: [id number] Description: 29445 - APPLY TOTAL CONTACT LEG CAST ICD-10 Diagnosis Description E11.621 Type 2 diabetes mellitus with foot ulcer L97.512 Non-pressure 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 E11.42 Type 2  diabetes mellitus with  diabetic polyneuropathy F17.290 Nicotine dependence, other tobacco product, uncomplicated Modifier: Quantity: 1 Physician Procedures : CPT4 Code Description Modifier [id number] - WC PHYS APPLY TOTAL CONTACT CAST ICD-10 Diagnosis Description E11.621 Type 2 diabetes mellitus with foot ulcer L97.512 Non-pressure 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 E11.42  Type 2 diabetes mellitus with diabetic polyneuropathy F17.290 Nicotine dependence, other tobacco product, uncomplicated Quantity: 1 Electronic Signature(s) Signed: 12/28/2020 10:37:55 AM By: [name] Shan DO Entered By: [name] on 12/28/2020 10:37:27

## 2020-12-30 ENCOUNTER — Telehealth: Payer: Self-pay | Admitting: *Deleted

## 2020-12-30 DIAGNOSIS — E08621 Diabetes mellitus due to underlying condition with foot ulcer: Secondary | ICD-10-CM

## 2020-12-30 NOTE — Telephone Encounter (Signed)
Thanks

## 2020-12-30 NOTE — Telephone Encounter (Signed)
Resent a new order with icd-10 diagnosis code for a walker per patient's request to [facility] , received confirmation-12/30/20

## 2020-12-30 NOTE — Telephone Encounter (Signed)
Patient is calling because she said that [facility] is needing a diagnosis code for the walker. Called medical supply,explained that the diagnosis code was on the order sent but said that they do not need because the company does not file for the knee scooter,considered a luxury item, can only bill insurance for the wheelchair  if they have Medicare part B,may go there and purchase or rent ($20 weekly) or ($70 mo.) from them..Notified the patient.

## 2021-01-04 ENCOUNTER — Encounter (HOSPITAL_BASED_OUTPATIENT_CLINIC_OR_DEPARTMENT_OTHER): Payer: Medicare Other | Admitting: Internal Medicine

## 2021-01-04 ENCOUNTER — Telehealth: Payer: Self-pay | Admitting: *Deleted

## 2021-01-04 ENCOUNTER — Other Ambulatory Visit: Payer: Self-pay

## 2021-01-04 DIAGNOSIS — E11621 Type 2 diabetes mellitus with foot ulcer: Secondary | ICD-10-CM

## 2021-01-04 DIAGNOSIS — E08621 Diabetes mellitus due to underlying condition with foot ulcer: Secondary | ICD-10-CM

## 2021-01-04 DIAGNOSIS — L97512 Non-pressure 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: Secondary | ICD-10-CM | POA: Diagnosis not present

## 2021-01-04 DIAGNOSIS — Z794 Long term (current) use of insulin: Secondary | ICD-10-CM | POA: Diagnosis not present

## 2021-01-04 DIAGNOSIS — E1142 Type 2 diabetes mellitus with diabetic polyneuropathy: Secondary | ICD-10-CM | POA: Diagnosis not present

## 2021-01-04 DIAGNOSIS — F17210 Nicotine dependence, cigarettes, uncomplicated: Secondary | ICD-10-CM | POA: Diagnosis not present

## 2021-01-04 DIAGNOSIS — L97511 Non-pressure chronic ulcer of other part of right foot limited to breakdown of skin: Secondary | ICD-10-CM | POA: Diagnosis not present

## 2021-01-04 NOTE — Telephone Encounter (Signed)
Faxed new referral to [facility] for the Biatric walker, received confirmation 01/04/21,patient notified.

## 2021-01-04 NOTE — Progress Notes (Signed)
SHABREE, TEBBETTS ([id number]) Visit Report for 01/04/2021 Chief Complaint Document Details Patient Name: Date of Service: [name], [name] [name] K. 01/04/2021 8:45 [name] Medical Record Number: [id number] Patient Account Number: [id number] Date of Birth/Sex: Treating RN: April 03, 1955 (65 y.o. [name] Primary Care Provider: Geoffery [name] Other Clinician: Referring Provider: Treating Provider/Extender: [name] in Treatment: 2 Information Obtained from: Patient Chief Complaint Right Plantar foot wound Electronic Signature(s) Signed: 01/04/2021 10:06:42 AM By: [name] Shan DO Entered By: [name] on 01/04/2021 10:03:19 -------------------------------------------------------------------------------- HPI Details Patient Name: Date of Service: [name], [name] [name] K. 01/04/2021 8:45 [name] Medical Record Number: [id number] Patient Account Number: [id number] Date of Birth/Sex: Treating RN: [birth year] (65 y.o. [name] Primary Care Provider: Geoffery [name] Other Clinician: Referring Provider: Treating Provider/Extender: [name] in Treatment: 2 History of Present Illness HPI Description: Admission 12/17/2020 [name] [name] 65 year old female with [name] past medical history of insulin-dependent type 2 diabetes, hypothyroidism and daily1 pack per day cigarette smoker the presents to the clinic for [name] 6-week history of nonhealing wound to the right first MTPJ. She has been following with [name], podiatry for this issue. She has been using silver alginate with dressing changes. She uses [name] postsurgical shoe and offloading pads. She currently denies signs of infection. 10/31; patient presents for follow-up. She tolerated the soft cast fine although she states that she felt her foot rolling to one side. She denies signs of infection. She would like to do the total contact cast  today. 12/23/2020 upon evaluation today patient appears to be doing excellent in regard to her wound on the foot and she is in [name] total contact cast. I do think this is appropriate this is the first cast change which we are obliged to do to ensure nothing is rubbing everything appears to be doing quite well and very pleased in that regard. 11/7; patient presents for follow-up. She had no issues with the cast. She denies signs of infection. 11/14; patient presents for 1 week follow-up. She has had no issues with the cast. She denies signs of infection. Electronic Signature(s) Signed: 01/04/2021 10:06:42 AM By: [name] Shan DO Entered By: [name] on 01/04/2021 10:03:37 -------------------------------------------------------------------------------- Physical Exam Details Patient Name: Date of Service: [name], [name] [name] K. 01/04/2021 8:45 [name] Medical Record Number: [id number] Patient Account Number: [id number] Date of Birth/Sex: Treating RN: [birth year] (65 y.o. [name] Primary Care Provider: Geoffery [name] Other Clinician: Referring Provider: Treating Provider/Extender: [name] in Treatment: 2 Constitutional respirations regular, non-labored and within target range for patient.. Cardiovascular 2+ dorsalis pedis/posterior tibialis pulses. Psychiatric pleasant and cooperative. Notes Right first met head with [name] punched out open wound with granulation tissue and very minimal circumferential undermining. No signs of infection. No maceration noted. Electronic Signature(s) Signed: 01/04/2021 10:06:42 AM By: [name] Shan DO Entered By: [name] on 01/04/2021 10:04:09 -------------------------------------------------------------------------------- Physician Orders Details Patient Name: Date of Service: [name], [name] [name] K. 01/04/2021 8:45 [name] Medical Record Number: [id number] Patient Account Number: [id number] Date of  Birth/Sex: Treating RN: 05-18-1955 (65 y.o. [name] Primary Care Provider: Geoffery [name] Other Clinician: Referring Provider: Treating Provider/Extender: [name] in Treatment: 2 Verbal / Phone Orders: No Diagnosis Coding ICD-10 Coding Code Description E11.621 Type 2 diabetes mellitus with foot ulcer L97.512 Non-pressure chronic ulcer of other part of  right foot with fat layer exposed E11.42 Type 2 diabetes mellitus with diabetic polyneuropathy F17.290 Nicotine dependence, other tobacco product, uncomplicated Follow-up Appointments ppointment in 1 week. - on Monday with [name] Return [name] Cellular or Tissue Based Products Cellular or Tissue Based Product Type: - Will order Puraply AM for next week Bathing/ Shower/ Hygiene May shower with protection but do not get wound dressing(s) wet. Edema Control - Lymphedema / SCD / Other Elevate legs to the level of the heart or above for 30 minutes daily and/or when sitting, [name] frequency of: Avoid standing for long periods of time. Off-Loading Total Contact Cast to Right Lower Extremity Wound Treatment Wound #1 - Metatarsal head first Wound Laterality: Right Cleanser: Soap and Water 1 x Per Week Discharge Instructions: May shower and wash wound with dial antibacterial soap and water prior to dressing change. Cleanser: Wound Cleanser 1 x Per Week Discharge Instructions: Cleanse the wound with wound cleanser prior to applying [name] clean dressing using gauze sponges, not tissue or cotton balls. Prim Dressing: KerraCel Ag Gelling Fiber Dressing, 4x5 in (silver alginate) 1 x Per Week ary Discharge Instructions: Apply silver alginate to wound bed as instructed Secondary Dressing: ABD Pad, 5x9 1 x Per Week Discharge Instructions: Apply over primary dressing as directed. Secondary Dressing: Drawtex 4x4 in 1 x Per Week Discharge Instructions: Apply over primary dressing as directed. Secured With: 71M  Medipore H Soft Cloth Surgical T ape, 4 x 10 (in/yd) 1 x Per Week Discharge Instructions: Secure with tape as directed. Electronic Signature(s) Signed: 01/04/2021 10:06:42 AM By: [name] Shan DO Entered By: [name] on 01/04/2021 10:04:24 -------------------------------------------------------------------------------- Problem List Details Patient Name: Date of Service: [name], [name] [name] K. 01/04/2021 8:45 [name] Medical Record Number: [id number] Patient Account Number: [id number] Date of Birth/Sex: Treating RN: 02/14/1956 (65 y.o. [name] Primary Care Provider: Geoffery [name] Other Clinician: Referring Provider: Treating Provider/Extender: [name] in Treatment: 2 Active Problems ICD-10 Encounter Code Description Active Date MDM Diagnosis E11.621 Type 2 diabetes mellitus with foot ulcer 12/17/2020 No Yes L97.512 Non-pressure 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 12/21/2020 No Yes E11.42 Type 2 diabetes mellitus with diabetic polyneuropathy 12/17/2020 No Yes F17.290 Nicotine dependence, other tobacco product, uncomplicated [id number]/[id number] No Yes Inactive Problems Resolved Problems Electronic Signature(s) Signed: 01/04/2021 10:06:42 AM By: [name] Shan DO Entered By: [name] on 01/04/2021 10:03:03 -------------------------------------------------------------------------------- Progress Note Details Patient Name: Date of Service: Fletcher, [name] [name] K. 01/04/2021 8:45 [name] Medical Record Number: [id number] Patient Account Number: [id number] Date of Birth/Sex: Treating RN: May 02, 1955 (65 y.o. [name] Primary Care Provider: Geoffery [name] Other Clinician: Referring Provider: Treating Provider/Extender: [name] in Treatment: 2 Subjective Chief Complaint Information obtained from Patient Right Plantar foot wound History of Present  Illness (HPI) Admission 12/17/2020 Ms. Darlyn Repsher is [name] 65 year old female with [name] past medical history of insulin-dependent type 2 diabetes, hypothyroidism and daily1 pack per day cigarette smoker the presents to the clinic for [name] 6-week history of nonhealing wound to the right first MTPJ. She has been following with [name], podiatry for this issue. She has been using silver alginate with dressing changes. She uses [name] postsurgical shoe and offloading pads. She currently denies signs of infection. 10/31; patient presents for follow-up. She tolerated the soft cast fine although she states that she felt her foot rolling to one side. She denies signs of infection.  She would like to do the total contact cast today. 12/23/2020 upon evaluation today patient appears to be doing excellent in regard to her wound on the foot and she is in [name] total contact cast. I do think this is appropriate this is the first cast change which we are obliged to do to ensure nothing is rubbing everything appears to be doing quite well and very pleased in that regard. 11/7; patient presents for follow-up. She had no issues with the cast. She denies signs of infection. 11/14; patient presents for 1 week follow-up. She has had no issues with the cast. She denies signs of infection. Patient History Information obtained from Patient, Chart. Family History Unknown History. Social History Current every day smoker - 1 pack/[name]/day, Alcohol Use - Never, Drug Use - No History, Caffeine Use - Moderate. Medical History Eyes Denies history of Cataracts, Glaucoma, Optic Neuritis Ear/Nose/Mouth/Throat Denies history of Chronic sinus problems/congestion, Middle ear problems Hematologic/Lymphatic Denies history of Anemia, Hemophilia, Human Immunodeficiency Virus, Lymphedema, Sickle Cell Disease Respiratory Patient has history of Sleep Apnea Denies history of Aspiration, Asthma, Chronic Obstructive Pulmonary Disease (COPD),  Pneumothorax, Tuberculosis Cardiovascular Patient has history of Coronary Artery Disease, Hypertension Denies history of Angina, Arrhythmia, Congestive Heart Failure, Deep Vein Thrombosis, Hypotension, Myocardial Infarction, Peripheral Arterial Disease, Peripheral Venous Disease, Phlebitis, Vasculitis Gastrointestinal Denies history of Cirrhosis , Colitis, Crohnoos, Hepatitis [name], Hepatitis B, Hepatitis C Endocrine Patient has history of Type II Diabetes Denies history of Type I Diabetes Genitourinary Denies history of End Stage Renal Disease Immunological Denies history of Lupus Erythematosus, Raynaudoos, Scleroderma Integumentary (Skin) Denies history of History of Burn Musculoskeletal Denies history of Gout, Rheumatoid Arthritis, Osteoarthritis, Osteomyelitis Neurologic Patient has history of Neuropathy Denies history of Dementia, Quadriplegia, Paraplegia, Seizure Disorder Hospitalization/Surgery History - 2nd and 3rd right toe amps. - total hysterectomy. - cholecystectomy. - 2 knee surgery's on left and 1 on right. Medical [name] Surgical History Notes nd Cardiovascular hypercholesterolemia Objective Constitutional respirations regular, non-labored and within target range for patient.. Vitals Time Taken: 8:48 AM, Height: 67 in, Weight: 277 lbs, BMI: 43.4, Temperature: 97.9 F, Pulse: 81 bpm, Respiratory Rate: 18 breaths/min, Blood Pressure: 164/78 mmHg. Cardiovascular 2+ dorsalis pedis/posterior tibialis pulses. Psychiatric pleasant and cooperative. General Notes: Right first met head with [name] punched out open wound with granulation tissue and very minimal circumferential undermining. No signs of infection. No maceration noted. Integumentary (Hair, Skin) Wound #1 status is Open. Original cause of wound was Gradually Appeared. The date acquired was: 10/29/2020. The wound has been in treatment 2 weeks. The wound is located on the Right Metatarsal head first. The wound measures 0.5cm  length x 0.4cm width x 0.2cm depth; 0.157cm^2 area and 0.031cm^3 volume. There is Fat Layer (Subcutaneous Tissue) exposed. There is no tunneling or undermining noted. There is [name] medium amount of serosanguineous drainage noted. The wound margin is distinct with the outline attached to the wound base. There is large (67-100%) red, pink granulation within the wound bed. There is no necrotic tissue within the wound bed. Assessment Active Problems ICD-10 Type 2 diabetes mellitus with foot ulcer Non-pressure 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 Type 2 diabetes mellitus with diabetic polyneuropathy Nicotine dependence, other tobacco product, uncomplicated Patient's wound has shown improvement in size and appearance since last clinic visit. No signs of infection on exam. She has been approved for PuraPly. We will order this for next clinic visit. For now we will use silver alginate under the TCC. Follow-up  in 1 week. Procedures Wound #1 Pre-procedure diagnosis of Wound #1 is [name] Diabetic Wound/Ulcer of the Lower Extremity located on the Right Metatarsal head first . There was [name] T Contact otal Cast Procedure by [name] Shan, DO. Post procedure Diagnosis Wound #1: Same as Pre-Procedure Plan Follow-up Appointments: Return Appointment in 1 week. - on Monday with Dr. Heber [place] Cellular or Tissue Based Products: Cellular or Tissue Based Product Type: - Will order Puraply AM for next week Bathing/ Shower/ Hygiene: May shower with protection but do not get wound dressing(s) wet. Edema Control - Lymphedema / SCD / Other: Elevate legs to the level of the heart or above for 30 minutes daily and/or when sitting, [name] frequency of: Avoid standing for long periods of time. Off-Loading: T Contact Cast to Right Lower Extremity otal WOUND #1: - Metatarsal head first Wound Laterality: Right Cleanser: Soap and Water 1 x Per Week/ Discharge Instructions: May shower and wash wound with  dial antibacterial soap and water prior to dressing change. Cleanser: Wound Cleanser 1 x Per Week/ Discharge Instructions: Cleanse the wound with wound cleanser prior to applying [name] clean dressing using gauze sponges, not tissue or cotton balls. Prim Dressing: KerraCel Ag Gelling Fiber Dressing, 4x5 in (silver alginate) 1 x Per Week/ ary Discharge Instructions: Apply silver alginate to wound bed as instructed Secondary Dressing: ABD Pad, 5x9 1 x Per Week/ Discharge Instructions: Apply over primary dressing as directed. Secondary Dressing: Drawtex 4x4 in 1 x Per Week/ Discharge Instructions: Apply over primary dressing as directed. Secured With: 91M Medipore H Soft Cloth Surgical T ape, 4 x 10 (in/yd) 1 x Per Week/ Discharge Instructions: Secure with tape as directed. 1. TCC with silver alginate 2. Follow-up in 1 week Electronic Signature(s) Signed: 01/04/2021 10:06:42 AM By: [name] Shan DO Entered By: [name] on 01/04/2021 10:06:05 -------------------------------------------------------------------------------- HxROS Details Patient Name: Date of Service: [name], [name] [name] K. 01/04/2021 8:45 [name] Medical Record Number: [id number] Patient Account Number: [id number] Date of Birth/Sex: Treating RN: 1955-07-28 (65 y.o. [name] Primary Care Provider: Geoffery [name] Other Clinician: Referring Provider: Treating Provider/Extender: [name] in Treatment: 2 Information Obtained From Patient Chart Eyes Medical History: Negative for: Cataracts; Glaucoma; Optic Neuritis Ear/Nose/Mouth/Throat Medical History: Negative for: Chronic sinus problems/congestion; Middle ear problems Hematologic/Lymphatic Medical History: Negative for: Anemia; Hemophilia; Human Immunodeficiency Virus; Lymphedema; Sickle Cell Disease Respiratory Medical History: Positive for: Sleep Apnea Negative for: Aspiration; Asthma; Chronic Obstructive Pulmonary  Disease (COPD); Pneumothorax; Tuberculosis Cardiovascular Medical History: Positive for: Coronary Artery Disease; Hypertension Negative for: Angina; Arrhythmia; Congestive Heart Failure; Deep Vein Thrombosis; Hypotension; Myocardial Infarction; Peripheral Arterial Disease; Peripheral Venous Disease; Phlebitis; Vasculitis Past Medical History Notes: hypercholesterolemia Gastrointestinal Medical History: Negative for: Cirrhosis ; Colitis; Crohns; Hepatitis [name]; Hepatitis B; Hepatitis C Endocrine Medical History: Positive for: Type II Diabetes Negative for: Type I Diabetes Genitourinary Medical History: Negative for: End Stage Renal Disease Immunological Medical History: Negative for: Lupus Erythematosus; Raynauds; Scleroderma Integumentary (Skin) Medical History: Negative for: History of Burn Musculoskeletal Medical History: Negative for: Gout; Rheumatoid Arthritis; Osteoarthritis; Osteomyelitis Neurologic Medical History: Positive for: Neuropathy Negative for: Dementia; Quadriplegia; Paraplegia; Seizure Disorder Immunizations Pneumococcal Vaccine: Received Pneumococcal Vaccination: Yes Received Pneumococcal Vaccination On or After 60th Birthday: Yes Tetanus Vaccine: Last tetanus shot: 12/17/2020 Implantable Devices No devices added Hospitalization / Surgery History Type of Hospitalization/Surgery 2nd and 3rd right toe amps total hysterectomy cholecystectomy 2 knee surgery's on left and 1 on right Family and Social History Unknown  History: Yes; Current every day smoker - 1 pack/[name]/day; Alcohol Use: Never; Drug Use: No History; Caffeine Use: Moderate; Financial Concerns: No; Food, Clothing or Shelter Needs: No; Support System Lacking: No; Transportation Concerns: No Electronic Signature(s) Signed: 01/04/2021 10:06:42 AM By: [name] Shan DO Signed: 01/04/2021 4:35:28 PM By: Lorrin Jackson Entered By: [name] on 01/04/2021  10:03:42 -------------------------------------------------------------------------------- Total Contact Cast Details Patient Name: Date of Service: [name], [name] [name] K. 01/04/2021 8:45 [name] Medical Record Number: [id number] Patient Account Number: [id number] Date of Birth/Sex: Treating RN: May 30, 1955 (65 y.o. [name] Primary Care Provider: Geoffery [name] Other Clinician: Referring Provider: Treating Provider/Extender: [name] in Treatment: 2 T Contact Cast Applied for Wound Assessment: otal Wound #1 Right Metatarsal head first Performed By: Physician [name] Shan, DO Post Procedure Diagnosis Same as Pre-procedure Electronic Signature(s) Signed: 01/04/2021 10:06:42 AM By: [name] Shan DO Signed: 01/04/2021 4:35:28 PM By: Lorrin Jackson Entered By: Lorrin Jackson on 01/04/2021 09:24:42 -------------------------------------------------------------------------------- [place] Details Patient Name: Date of Service: [name], [name] [name] K. 01/04/2021 Medical Record Number: [id number] Patient Account Number: [id number] Date of Birth/Sex: Treating RN: 1955/04/22 (64 y.o. [name] Primary Care Provider: Geoffery [name] Other Clinician: Referring Provider: Treating Provider/Extender: [name] in Treatment: 2 Diagnosis Coding ICD-10 Codes Code Description [phone] Type 2 diabetes mellitus with foot ulcer L97.512 Non-pressure 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 E11.42 Type 2 diabetes mellitus with diabetic polyneuropathy F17.290 Nicotine dependence, other tobacco product, uncomplicated Facility Procedures CPT4 Code: [id number] Description: 29445 - APPLY TOTAL CONTACT LEG CAST ICD-10 Diagnosis Description L97.512 Non-pressure 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 E11.621 Type 2 diabetes mellitus with foot  ulcer Modifier: Quantity: 1 Physician Procedures : CPT4 Code Description Modifier [id number] - WC PHYS APPLY TOTAL CONTACT CAST ICD-10 Diagnosis Description L97.512 Non-pressure 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 E11.621 Type 2 diabetes mellitus with foot ulcer Quantity: 1 Electronic Signature(s) Signed: 01/04/2021 10:06:42 AM By: [name] Shan DO Entered By: [name] on 01/04/2021 10:06:20

## 2021-01-04 NOTE — Telephone Encounter (Signed)
Okay to sign the order and send. - [name]

## 2021-01-04 NOTE — Telephone Encounter (Signed)
Patient is calling because she cannot fit in walker seat she received, would need the biatric rolator walker but needs it sent today due to medicare guidelines. Please resend script to [facility]. Please advise.

## 2021-01-05 NOTE — Telephone Encounter (Signed)
Sent new order to [facility], patient notified

## 2021-01-05 NOTE — Progress Notes (Signed)
HENDY, BRINDLE ([id number]) Visit Report for 01/04/2021 Arrival Information Details Patient Name: Date of Service: [name], [name] 01/04/2021 8:45 A M Medical Record Number: [id number] Patient Account Number: [id number] Date of Birth/Sex: Treating RN: 01/28/1956 (65 y.o. [name] Primary Care Fabiano Ginley: [name] Other Clinician: Referring Koda Defrank: Treating Frayda Egley/Extender: [name] in Treatment: 2 Visit Information History Since Last Visit Added or deleted any medications: No Patient Arrived: Ambulatory Any new allergies or adverse reactions: No Arrival Time: 08:48 Had a fall or experienced change in No Accompanied By: self activities of daily living that may affect Transfer Assistance: None risk of falls: Patient Identification Verified: Yes Signs or symptoms of abuse/neglect since last visito No Secondary Verification Process Completed: Yes Hospitalized since last visit: No Patient Requires Transmission-Based Precautions: No Implantable device outside of the clinic excluding No Patient Has Alerts: No cellular tissue based products placed in the center since last visit: Has Dressing in Place as Prescribed: Yes Pain Present Now: No Electronic Signature(s) Signed: 01/05/2021 1:54:26 PM By: Sandre Kitty Entered By: Sandre Kitty on 01/04/2021 08:48:43 -------------------------------------------------------------------------------- Encounter Discharge Information Details Patient Name: Date of Service: [name], [name] 01/04/2021 8:45 A M Medical Record Number: [id number] Patient Account Number: [id number] Date of Birth/Sex: Treating RN: 27-Aug-1955 (64 y.o. [name] Primary Care Neera Teng: [name] Other Clinician: Referring Shavonte Zhao: Treating Marijayne Rauth/Extender: [name] in Treatment: 2 Encounter Discharge Information Items Discharge  Condition: Stable Ambulatory Status: Ambulatory Discharge Destination: Home Transportation: Private Auto Schedule Follow-up Appointment: Yes Clinical Summary of Care: Provided on 01/04/2021 Form Type Recipient Paper Patient Patient Electronic Signature(s) Signed: 01/04/2021 4:35:28 PM By: Lorrin Jackson Entered By: Lorrin Jackson on 01/04/2021 09:43:17 -------------------------------------------------------------------------------- Lower Extremity Assessment Details Patient Name: Date of Service: [name], [name] 01/04/2021 8:45 A M Medical Record Number: [id number] Patient Account Number: [id number] Date of Birth/Sex: Treating RN: 26-Feb-1955 (65 y.o. [name] Primary Care Jacy Howat: [name] Other Clinician: Referring Anastasija Anfinson: Treating Laytoya Ion/Extender: Glenna Fellows Weeks in Treatment: 2 Edema Assessment Assessed: [Left: No] [Right: Yes] Edema: [Left: Ye] [Right: s] Calf Left: Right: Point of Measurement: 32 cm From Medial Instep 44.5 cm Ankle Left: Right: Point of Measurement: 8 cm From Medial Instep 25 cm Vascular Assessment Pulses: Dorsalis Pedis Palpable: [Right:Yes] Electronic Signature(s) Signed: 01/04/2021 4:35:28 PM By: Lorrin Jackson Entered By: Lorrin Jackson on 01/04/2021 09:08:36 -------------------------------------------------------------------------------- Multi Wound Chart Details Patient Name: Date of Service: [name], [name] 01/04/2021 8:45 A M Medical Record Number: [id number] Patient Account Number: [id number] Date of Birth/Sex: Treating RN: 1955/08/26 (65 y.o. [name] Primary Care Kylin Genna: [name] Other Clinician: Referring Ruger Saxer: Treating Lashun Mccants/Extender: [name] in Treatment: 2 Vital Signs Height(in): 72 Pulse(bpm): 49 Weight(lbs): 277 Blood Pressure(mmHg): 164/78 Body Mass Index(BMI): 43 Temperature(F):  97.9 Respiratory Rate(breaths/min): 18 Photos: [1:Right Metatarsal head first] [N/A:N/A N/A] Wound Location: [1:Gradually Appeared] [N/A:N/A] Wounding Event: [1:Diabetic Wound/Ulcer of the Lower] [N/A:N/A] Primary Etiology: [1:Extremity Sleep Apnea, Coronary Artery] [N/A:N/A] Comorbid History: [1:Disease, Hypertension, Type II Diabetes, Neuropathy 10/29/2020] [N/A:N/A] Date Acquired: [1:2] [N/A:N/A] Weeks of Treatment: [1:Open] [N/A:N/A] Wound Status: [1:0.5x0.4x0.2] [N/A:N/A] Measurements L x W x D (cm) [1:0.157] [N/A:N/A] A (cm) : rea [1:0.031] [N/A:N/A] Volume (cm) : [1:63.00%] [N/A:N/A] % Reduction in A rea: [1:26.20%] [N/A:N/A] % Reduction in Volume: [1:Grade 1] [N/A:N/A] Classification: [1:Medium] [N/A:N/A] Exudate A mount: [1:Serosanguineous] [N/A:N/A] Exudate Type: [  1:red, brown] [N/A:N/A] Exudate Color: [1:Distinct, outline attached] [N/A:N/A] Wound Margin: [1:Large (67-100%)] [N/A:N/A] Granulation A mount: [1:Red, Pink] [N/A:N/A] Granulation Quality: [1:None Present (0%)] [N/A:N/A] Necrotic A mount: [1:Fat Layer (Subcutaneous Tissue): Yes N/A] Exposed Structures: [1:Fascia: No Tendon: No Muscle: No Joint: No Bone: No None] [N/A:N/A] Epithelialization: [1:T Contact Cast otal] [N/A:N/A] Treatment Notes Wound #1 (Metatarsal head first) Wound Laterality: Right Cleanser Soap and Water Discharge Instruction: May shower and wash wound with dial antibacterial soap and water prior to dressing change. Wound Cleanser Discharge Instruction: Cleanse the wound with wound cleanser prior to applying a clean dressing using gauze sponges, not tissue or cotton balls. Peri-Wound Care Topical Primary Dressing KerraCel Ag Gelling Fiber Dressing, 4x5 in (silver alginate) Discharge Instruction: Apply silver alginate to wound bed as instructed Secondary Dressing ABD Pad, 5x9 Discharge Instruction: Apply over primary dressing as directed. Drawtex 4x4 in Discharge Instruction: Apply over  primary dressing as directed. Secured With 43M Medipore H Soft Cloth Surgical T ape, 4 x 10 (in/yd) Discharge Instruction: Secure with tape as directed. Compression Wrap Compression Stockings Add-Ons Electronic Signature(s) Signed: 01/04/2021 10:06:42 AM By: Kalman Shan DO Signed: 01/04/2021 4:35:28 PM By: Lorrin Jackson Entered By: Kalman Shan on 01/04/2021 10:03:11 -------------------------------------------------------------------------------- Multi-Disciplinary Care Plan Details Patient Name: Date of Service: [name], [name] 01/04/2021 8:45 A M Medical Record Number: [id number] Patient Account Number: [id number] Date of Birth/Sex: Treating RN: 31-Oct-1955 (65 y.o. [name] Primary Care Donyel Castagnola: [name] Other Clinician: Referring Randle Shatzer: Treating Aalliyah Kilker/Extender: [name] in Treatment: 2 Active Inactive Nutrition Nursing Diagnoses: Impaired glucose control: actual or potential Goals: Patient/caregiver verbalizes understanding of need to maintain therapeutic glucose control per primary care physician Date Initiated: 12/17/2020 Target Resolution Date: 01/21/2021 Goal Status: Active Interventions: Assess HgA1c results as ordered upon admission and as needed Assess patient nutrition upon admission and as needed per policy Provide education on elevated blood sugars and impact on wound healing Notes: Wound/Skin Impairment Nursing Diagnoses: Impaired tissue integrity Goals: Patient/caregiver will verbalize understanding of skin care regimen Date Initiated: 12/17/2020 Target Resolution Date: 01/21/2021 Goal Status: Active Ulcer/skin breakdown will have a volume reduction of 30% by week 4 Date Initiated: 12/17/2020 Target Resolution Date: 01/21/2021 Goal Status: Active Interventions: Assess patient/caregiver ability to obtain necessary supplies Assess patient/caregiver ability to perform ulcer/skin  care regimen upon admission and as needed Assess ulceration(s) every visit Provide education on ulcer and skin care Treatment Activities: Topical wound management initiated : 12/17/2020 Notes: Electronic Signature(s) Signed: 01/04/2021 4:35:28 PM By: Lorrin Jackson Entered By: Lorrin Jackson on 01/04/2021 09:10:05 -------------------------------------------------------------------------------- Pain Assessment Details Patient Name: Date of Service: [name], [name] 01/04/2021 8:45 A M Medical Record Number: [id number] Patient Account Number: [id number] Date of Birth/Sex: Treating RN: 15-Jul-1955 (65 y.o. [name] Primary Care Jelan Batterton: Other Clinician: [name] Referring Denajah Farias: Treating Baylyn Sickles/Extender: [name] in Treatment: 2 Active Problems Location of Pain Severity and Description of Pain Patient Has Paino No Site Locations Pain Management and Medication Current Pain Management: Electronic Signature(s) Signed: 01/04/2021 4:35:28 PM By: Lorrin Jackson Signed: 01/05/2021 1:54:26 PM By: Sandre Kitty Entered By: Sandre Kitty on 01/04/2021 08:49:05 -------------------------------------------------------------------------------- Patient/Caregiver Education Details Patient Name: Date of Service: [name], [name] 11/14/2022andnbsp8:45 Sun Record Number: [id number] Patient Account Number: [id number] Date of Birth/Gender: Treating RN: 02-Jul-1955 (65 y.o. [name] Primary Care Physician: [name] Other Clinician: Referring Physician: Treating Physician/Extender: Kalman Shan  Denyse Dago in Treatment: 2 Education Assessment Education Provided To: Patient Education Topics Provided Elevated Blood Sugar/ Impact on Healing: Offloading: Methods: Explain/Verbal, Printed Responses: State content correctly Wound/Skin Impairment: Methods: Explain/Verbal,  Printed Responses: State content correctly Electronic Signature(s) Signed: 01/04/2021 4:35:28 PM By: Lorrin Jackson Entered By: Lorrin Jackson on 01/04/2021 09:10:35 -------------------------------------------------------------------------------- Wound Assessment Details Patient Name: Date of Service: [name], [name] 01/04/2021 8:45 A M Medical Record Number: [id number] Patient Account Number: [id number] Date of Birth/Sex: Treating RN: 1955/07/24 (65 y.o. [name] Primary Care Kaydan Wilhoite: [name] Other Clinician: Referring Kamayah Pillay: Treating Aubrynn Katona/Extender: [name] in Treatment: 2 Wound Status Wound Number: 1 Primary Diabetic Wound/Ulcer of the Lower Extremity Etiology: Wound Location: Right Metatarsal head first Wound Open Wounding Event: Gradually Appeared Status: Date Acquired: 10/29/2020 Comorbid Sleep Apnea, Coronary Artery Disease, Hypertension, Type II Weeks Of Treatment: 2 History: Diabetes, Neuropathy Clustered Wound: No Photos Wound Measurements Length: (cm) 0.5 Width: (cm) 0.4 Depth: (cm) 0.2 Area: (cm) 0.157 Volume: (cm) 0.031 % Reduction in Area: 63% % Reduction in Volume: 26.2% Epithelialization: None Tunneling: No Undermining: No Wound Description Classification: Grade 1 Wound Margin: Distinct, outline attached Exudate Amount: Medium Exudate Type: Serosanguineous Exudate Color: red, brown Foul Odor After Cleansing: No Slough/Fibrino No Wound Bed Granulation Amount: Large (67-100%) Exposed Structure Granulation Quality: Red, Pink Fascia Exposed: No Necrotic Amount: None Present (0%) Fat Layer (Subcutaneous Tissue) Exposed: Yes Tendon Exposed: No Muscle Exposed: No Joint Exposed: No Bone Exposed: No Treatment Notes Wound #1 (Metatarsal head first) Wound Laterality: Right Cleanser Soap and Water Discharge Instruction: May shower and wash wound with dial antibacterial soap and  water prior to dressing change. Wound Cleanser Discharge Instruction: Cleanse the wound with wound cleanser prior to applying a clean dressing using gauze sponges, not tissue or cotton balls. Peri-Wound Care Topical Primary Dressing KerraCel Ag Gelling Fiber Dressing, 4x5 in (silver alginate) Discharge Instruction: Apply silver alginate to wound bed as instructed Secondary Dressing ABD Pad, 5x9 Discharge Instruction: Apply over primary dressing as directed. Drawtex 4x4 in Discharge Instruction: Apply over primary dressing as directed. Secured With 40M Medipore H Soft Cloth Surgical T ape, 4 x 10 (in/yd) Discharge Instruction: Secure with tape as directed. Compression Wrap Compression Stockings Add-Ons Electronic Signature(s) Signed: 01/04/2021 4:35:28 PM By: Lorrin Jackson Entered By: Lorrin Jackson on 01/04/2021 09:09:29 -------------------------------------------------------------------------------- Vitals Details Patient Name: Date of Service: [name], [name] 01/04/2021 8:45 A M Medical Record Number: [id number] Patient Account Number: [id number] Date of Birth/Sex: Treating RN: Jul 26, 1955 (65 y.o. [name] Primary Care Rogina Schiano: [name] Other Clinician: Referring Nikita Humble: Treating Parag Dorton/Extender: [name] in Treatment: 2 Vital Signs Time Taken: 08:48 Temperature (F): 97.9 Height (in): 67 Pulse (bpm): 81 Weight (lbs): 277 Respiratory Rate (breaths/min): 18 Body Mass Index (BMI): 43.4 Blood Pressure (mmHg): 164/78 Reference Range: 80 - 120 mg / dl Electronic Signature(s) Signed: 01/05/2021 1:54:26 PM By: Sandre Kitty Entered By: Sandre Kitty on 01/04/2021 08:48:59

## 2021-01-06 ENCOUNTER — Ambulatory Visit (INDEPENDENT_AMBULATORY_CARE_PROVIDER_SITE_OTHER): Payer: Medicare Other | Admitting: Podiatry

## 2021-01-06 ENCOUNTER — Other Ambulatory Visit: Payer: Self-pay

## 2021-01-06 DIAGNOSIS — B351 Tinea unguium: Secondary | ICD-10-CM

## 2021-01-06 DIAGNOSIS — M79676 Pain in unspecified toe(s): Secondary | ICD-10-CM | POA: Diagnosis not present

## 2021-01-06 DIAGNOSIS — L989 Disorder of the skin and subcutaneous tissue, unspecified: Secondary | ICD-10-CM

## 2021-01-06 DIAGNOSIS — E08621 Diabetes mellitus due to underlying condition with foot ulcer: Secondary | ICD-10-CM

## 2021-01-06 DIAGNOSIS — L97512 Non-pressure 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: Secondary | ICD-10-CM

## 2021-01-11 ENCOUNTER — Encounter (HOSPITAL_BASED_OUTPATIENT_CLINIC_OR_DEPARTMENT_OTHER): Payer: Medicare Other | Admitting: Internal Medicine

## 2021-01-11 ENCOUNTER — Other Ambulatory Visit: Payer: Self-pay

## 2021-01-11 DIAGNOSIS — L97511 Non-pressure chronic ulcer of other part of right foot limited to breakdown of skin: Secondary | ICD-10-CM

## 2021-01-11 DIAGNOSIS — E11621 Type 2 diabetes mellitus with foot ulcer: Secondary | ICD-10-CM

## 2021-01-11 DIAGNOSIS — E1142 Type 2 diabetes mellitus with diabetic polyneuropathy: Secondary | ICD-10-CM

## 2021-01-11 DIAGNOSIS — L97512 Non-pressure 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: Secondary | ICD-10-CM

## 2021-01-11 DIAGNOSIS — F17210 Nicotine dependence, cigarettes, uncomplicated: Secondary | ICD-10-CM | POA: Diagnosis not present

## 2021-01-11 DIAGNOSIS — Z794 Long term (current) use of insulin: Secondary | ICD-10-CM | POA: Diagnosis not present

## 2021-01-11 NOTE — Progress Notes (Signed)
BORA, BOST ([id number]) Visit Report for 01/11/2021 Chief Complaint Document Details Patient Name: Date of Service: [name], [name] 01/11/2021 8:45 A M Medical Record Number: [id number] Patient Account Number: [id number] Date of Birth/Sex: Treating RN: 10-02-1955 (65 y.o. [name] Primary Care Provider: [name] Other Clinician: Referring Provider: Treating Provider/Extender: [name] in Treatment: 3 Information Obtained from: Patient Chief Complaint 10/27; Right Plantar foot wound 11/21; 2 small areas of skin breakdown to the right foot following cast placement Electronic Signature(s) Signed: 01/11/2021 10:26:52 AM By: Kalman Shan DO Entered By: Kalman Shan on 01/11/2021 10:19:07 -------------------------------------------------------------------------------- HPI Details Patient Name: Date of Service: [name], [name] 01/11/2021 8:45 A M Medical Record Number: [id number] Patient Account Number: [id number] Date of Birth/Sex: Treating RN: 14-Mar-1955 (65 y.o. [name] Primary Care Provider: [name] Other Clinician: Referring Provider: Treating Provider/Extender: [name] in Treatment: 3 History of Present Illness HPI Description: Admission 12/17/2020 Ms. Felicitas Sine is a 65 year old female with a past medical history of insulin-dependent type 2 diabetes, hypothyroidism and daily1 pack per day cigarette smoker the presents to the clinic for a 6-week history of nonhealing wound to the right first MTPJ. She has been following with [name], podiatry for this issue. She has been using silver alginate with dressing changes. She uses a postsurgical shoe and offloading pads. She currently denies signs of infection. 10/31; patient presents for follow-up. She tolerated the soft cast fine although she states that she felt her foot rolling to one  side. She denies signs of infection. She would like to do the total contact cast today. 12/23/2020 upon evaluation today patient appears to be doing excellent in regard to her wound on the foot and she is in a total contact cast. I do think this is appropriate this is the first cast change which we are obliged to do to ensure nothing is rubbing everything appears to be doing quite well and very pleased in that regard. 11/7; patient presents for follow-up. She had no issues with the cast. She denies signs of infection. 11/14; patient presents for 1 week follow-up. She has had no issues with the cast. She denies signs of infection. 11/21; patient presents for 1 week follow-up. She did develop 2 small areas of skin breakdown on either side of the right foot from the cast rubbing. She states she did not feel the wounds developing. She currently denies signs of infection. Electronic Signature(s) Signed: 01/11/2021 10:26:52 AM By: Kalman Shan DO Entered By: Kalman Shan on 01/11/2021 10:20:31 -------------------------------------------------------------------------------- Soft Cast Details Patient Name: Date of Service: [name], [name] 01/11/2021 8:45 A M Medical Record Number: [id number] Patient Account Number: [id number] Date of Birth/Sex: Treating RN: September 24, 1955 (65 y.o. [name] Primary Care Provider: [name] Other Clinician: Referring Provider: Treating Provider/Extender: [name] in Treatment: 3 Procedure Performed for: Wound #1 Right Metatarsal head first Performed By: Clinician Levan Hurst, RN Post Procedure Diagnosis Same as Pre-procedure Electronic Signature(s) Signed: 01/11/2021 10:26:52 AM By: Kalman Shan DO Signed: 01/11/2021 5:11:19 PM By: Levan Hurst RN, BSN Entered By: Levan Hurst on 01/11/2021 09:37:50 -------------------------------------------------------------------------------- Physical  Exam Details Patient Name: Date of Service: [name], [name] 01/11/2021 8:45 A M Medical Record Number: [id number] Patient Account Number: [id number] Date of Birth/Sex: Treating RN: 03-02-1955 (65 y.o. [name] Primary Care Provider: [name] Other  Clinician: Referring Provider: Treating Provider/Extender: [name] in Treatment: 3 Constitutional respirations regular, non-labored and within target range for patient.. Cardiovascular 2+ dorsalis pedis/posterior tibialis pulses. Psychiatric pleasant and cooperative. Notes Right first met head with open wound currently limited to skin breakdown. She has developed 2 new wounds. 1 to the right medial malleolus and the other to the right lateral foot. These are limited to skin breakdown. No signs of infection on exam. Electronic Signature(s) Signed: 01/11/2021 10:26:52 AM By: Kalman Shan DO Entered By: Kalman Shan on 01/11/2021 10:21:38 -------------------------------------------------------------------------------- Physician Orders Details Patient Name: Date of Service: [name], [name] 01/11/2021 8:45 A M Medical Record Number: [id number] Patient Account Number: [id number] Date of Birth/Sex: Treating RN: 1955/04/30 (65 y.o. [name] Primary Care Provider: [name] Other Clinician: Referring Provider: Treating Provider/Extender: [name] in Treatment: 3 Verbal / Phone Orders: No Diagnosis Coding ICD-10 Coding Code Description E11.621 Type 2 diabetes mellitus with foot ulcer L97.512 Non-pressure 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 E11.42 Type 2 diabetes mellitus with diabetic polyneuropathy F17.290 Nicotine dependence, other tobacco product, uncomplicated E07.121 Non-pressure chronic ulcer of other part of right foot limited to breakdown of skin Follow-up  Appointments ppointment in 1 week. - on Monday with Dr. Heber [place] Return A Bathing/ Shower/ Hygiene May shower with protection but do not get wound dressing(s) wet. Edema Control - Lymphedema / SCD / Other Elevate legs to the level of the heart or above for 30 minutes daily and/or when sitting, a frequency of: - throughout the day Avoid standing for long periods of time. Off-Loading Other: - Soft cast to right foot Wound Treatment Wound #1 - Metatarsal head first Wound Laterality: Right Cleanser: Soap and Water 1 x Per Week Discharge Instructions: May shower and wash wound with dial antibacterial soap and water prior to dressing change. Cleanser: Wound Cleanser 1 x Per Week Discharge Instructions: Cleanse the wound with wound cleanser prior to applying a clean dressing using gauze sponges, not tissue or cotton balls. Prim Dressing: KerraCel Ag Gelling Fiber Dressing, 4x5 in (silver alginate) 1 x Per Week ary Discharge Instructions: Apply silver alginate to wound bed as instructed Secondary Dressing: Woven Gauze Sponges 2x2 in 1 x Per Week Discharge Instructions: Apply over primary dressing as directed. Secondary Dressing: Drawtex 4x4 in 1 x Per Week Discharge Instructions: Apply over primary dressing as directed. Secured With: 50M Medipore H Soft Cloth Surgical T ape, 4 x 10 (in/yd) 1 x Per Week Discharge Instructions: Secure with tape as directed. Wound #2 - Malleolus Wound Laterality: Right, Medial Cleanser: Soap and Water 1 x Per Week Discharge Instructions: May shower and wash wound with dial antibacterial soap and water prior to dressing change. Cleanser: Wound Cleanser 1 x Per Week Discharge Instructions: Cleanse the wound with wound cleanser prior to applying a clean dressing using gauze sponges, not tissue or cotton balls. Prim Dressing: Promogran Prisma Matrix, 4.34 (sq in) (silver collagen) 1 x Per Week ary Discharge Instructions: Moisten collagen with saline or  hydrogel Secondary Dressing: Woven Gauze Sponges 2x2 in 1 x Per Week Discharge Instructions: Apply over primary dressing as directed. Secondary Dressing: Drawtex 4x4 in 1 x Per Week Discharge Instructions: Apply over primary dressing as directed. Secured With: 50M Medipore H Soft Cloth Surgical T ape, 4 x 10 (in/yd) 1 x Per Week Discharge Instructions: Secure with tape as directed. Wound #3 - Foot Wound Laterality: Right,  Lateral Cleanser: Soap and Water 1 x Per Week Discharge Instructions: May shower and wash wound with dial antibacterial soap and water prior to dressing change. Cleanser: Wound Cleanser 1 x Per Week Discharge Instructions: Cleanse the wound with wound cleanser prior to applying a clean dressing using gauze sponges, not tissue or cotton balls. Prim Dressing: Promogran Prisma Matrix, 4.34 (sq in) (silver collagen) 1 x Per Week ary Discharge Instructions: Moisten collagen with saline or hydrogel Secondary Dressing: Woven Gauze Sponges 2x2 in 1 x Per Week Discharge Instructions: Apply over primary dressing as directed. Secondary Dressing: Drawtex 4x4 in 1 x Per Week Discharge Instructions: Apply over primary dressing as directed. Secured With: 52M Medipore H Soft Cloth Surgical T ape, 4 x 10 (in/yd) 1 x Per Week Discharge Instructions: Secure with tape as directed. Electronic Signature(s) Signed: 01/11/2021 10:26:52 AM By: Kalman Shan DO Entered By: Kalman Shan on 01/11/2021 10:22:25 -------------------------------------------------------------------------------- Problem List Details Patient Name: Date of Service: [name], [name] 01/11/2021 8:45 A M Medical Record Number: [id number] Patient Account Number: [id number] Date of Birth/Sex: Treating RN: 05-28-1955 (65 y.o. [name] Primary Care Provider: [name] Other Clinician: Referring Provider: Treating Provider/Extender: [name] in Treatment:  3 Active Problems ICD-10 Encounter Code Description Active Date MDM Diagnosis E11.621 Type 2 diabetes mellitus with foot ulcer 12/17/2020 No Yes L97.512 Non-pressure 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 12/21/2020 No Yes E11.42 Type 2 diabetes mellitus with diabetic polyneuropathy 12/17/2020 No Yes F17.290 Nicotine dependence, other tobacco product, uncomplicated 98/26/4158 No Yes L97.511 Non-pressure chronic ulcer of other part of right foot limited to breakdown of 01/11/2021 No Yes skin Inactive Problems Resolved Problems Electronic Signature(s) Signed: 01/11/2021 10:26:52 AM By: Kalman Shan DO Entered By: Kalman Shan on 01/11/2021 10:17:46 -------------------------------------------------------------------------------- Progress Note Details Patient Name: Date of Service: Organ, [name] 01/11/2021 8:45 A M Medical Record Number: [id number] Patient Account Number: [id number] Date of Birth/Sex: Treating RN: September 03, 1955 (65 y.o. [name] Primary Care Provider: [name] Other Clinician: Referring Provider: Treating Provider/Extender: [name] in Treatment: 3 Subjective Chief Complaint Information obtained from Patient 10/27; Right Plantar foot wound 11/21; 2 small areas of skin breakdown to the right foot following cast placement History of Present Illness (HPI) Admission 12/17/2020 Ms. Neeti Knudtson is a 65 year old female with a past medical history of insulin-dependent type 2 diabetes, hypothyroidism and daily1 pack per day cigarette smoker the presents to the clinic for a 6-week history of nonhealing wound to the right first MTPJ. She has been following with [name], podiatry for this issue. She has been using silver alginate with dressing changes. She uses a postsurgical shoe and offloading pads. She currently denies signs of infection. 10/31; patient presents for follow-up.  She tolerated the soft cast fine although she states that she felt her foot rolling to one side. She denies signs of infection. She would like to do the total contact cast today. 12/23/2020 upon evaluation today patient appears to be doing excellent in regard to her wound on the foot and she is in a total contact cast. I do think this is appropriate this is the first cast change which we are obliged to do to ensure nothing is rubbing everything appears to be doing quite well and very pleased in that regard. 11/7; patient presents for follow-up. She had no issues with the cast. She denies signs of infection.  11/14; patient presents for 1 week follow-up. She has had no issues with the cast. She denies signs of infection. 11/21; patient presents for 1 week follow-up. She did develop 2 small areas of skin breakdown on either side of the right foot from the cast rubbing. She states she did not feel the wounds developing. She currently denies signs of infection. Patient History Information obtained from Patient, Chart. Family History Unknown History. Social History Current every day smoker - 1 pack/a/day, Alcohol Use - Never, Drug Use - No History, Caffeine Use - Moderate. Medical History Eyes Denies history of Cataracts, Glaucoma, Optic Neuritis Ear/Nose/Mouth/Throat Denies history of Chronic sinus problems/congestion, Middle ear problems Hematologic/Lymphatic Denies history of Anemia, Hemophilia, Human Immunodeficiency Virus, Lymphedema, Sickle Cell Disease Respiratory Patient has history of Sleep Apnea Denies history of Aspiration, Asthma, Chronic Obstructive Pulmonary Disease (COPD), Pneumothorax, Tuberculosis Cardiovascular Patient has history of Coronary Artery Disease, Hypertension Denies history of Angina, Arrhythmia, Congestive Heart Failure, Deep Vein Thrombosis, Hypotension, Myocardial Infarction, Peripheral Arterial Disease, Peripheral Venous Disease, Phlebitis,  Vasculitis Gastrointestinal Denies history of Cirrhosis , Colitis, Crohnoos, Hepatitis A, Hepatitis B, Hepatitis C Endocrine Patient has history of Type II Diabetes Denies history of Type I Diabetes Genitourinary Denies history of End Stage Renal Disease Immunological Denies history of Lupus Erythematosus, Raynaudoos, Scleroderma Integumentary (Skin) Denies history of History of Burn Musculoskeletal Denies history of Gout, Rheumatoid Arthritis, Osteoarthritis, Osteomyelitis Neurologic Patient has history of Neuropathy Denies history of Dementia, Quadriplegia, Paraplegia, Seizure Disorder Hospitalization/Surgery History - 2nd and 3rd right toe amps. - total hysterectomy. - cholecystectomy. - 2 knee surgery's on left and 1 on right. Medical A Surgical History Notes nd Cardiovascular hypercholesterolemia Objective Constitutional respirations regular, non-labored and within target range for patient.. Vitals Time Taken: 8:38 AM, Height: 67 in, Weight: 277 lbs, BMI: 43.4, Temperature: 98 F, Pulse: 87 bpm, Respiratory Rate: 18 breaths/min, Blood Pressure: 146/72 mmHg. Cardiovascular 2+ dorsalis pedis/posterior tibialis pulses. Psychiatric pleasant and cooperative. General Notes: Right first met head with open wound currently limited to skin breakdown. She has developed 2 new wounds. 1 to the right medial malleolus and the other to the right lateral foot. These are limited to skin breakdown. No signs of infection on exam. Integumentary (Hair, Skin) Wound #1 status is Open. Original cause of wound was Gradually Appeared. The date acquired was: 10/29/2020. The wound has been in treatment 3 weeks. The wound is located on the Right Metatarsal head first. The wound measures 0.2cm length x 0.2cm width x 0.1cm depth; 0.031cm^2 area and 0.003cm^3 volume. There is Fat Layer (Subcutaneous Tissue) exposed. There is a medium amount of serosanguineous drainage noted. The wound margin is distinct  with the outline attached to the wound base. There is large (67-100%) red, pink granulation within the wound bed. There is no necrotic tissue within the wound bed. Wound #2 status is Open. Original cause of wound was Shear/Friction. The date acquired was: 01/11/2021. The wound is located on the Right,Medial Malleolus. The wound measures 0.4cm length x 0.4cm width x 0.1cm depth; 0.126cm^2 area and 0.013cm^3 volume. There is Fat Layer (Subcutaneous Tissue) exposed. There is no tunneling or undermining noted. There is a medium amount of serosanguineous drainage noted. The wound margin is flat and intact. There is large (67-100%) red granulation within the wound bed. There is no necrotic tissue within the wound bed. Wound #3 status is Open. Original cause of wound was Shear/Friction. The date acquired was: 01/11/2021. The wound is located on the Right,Lateral Foot. The wound measures  0.4cm length x 0.5cm width x 0.1cm depth; 0.157cm^2 area and 0.016cm^3 volume. There is Fat Layer (Subcutaneous Tissue) exposed. There is no tunneling or undermining noted. There is a medium amount of serosanguineous drainage noted. The wound margin is flat and intact. There is large (67-100%) red, pink granulation within the wound bed. There is no necrotic tissue within the wound bed. Assessment Active Problems ICD-10 Type 2 diabetes mellitus with foot ulcer Non-pressure 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 Type 2 diabetes mellitus with diabetic polyneuropathy Nicotine dependence, other tobacco product, uncomplicated Non-pressure chronic ulcer of other part of right foot limited to breakdown of skin Patient's right plantar foot wound has shown improvement in size and appearance since last clinic visit. In fact it is almost healed. Unfortunately she developed 2 small wounds to the right foot from the cast rubbing. We will take a break from the hard cast and do a soft cast and this should help  alleviate this problem. I will continue silver alginate to the plantar foot wound and collagen to the remaining wounds. Follow-up in 1 week. Procedures Wound #1 Pre-procedure diagnosis of Wound #1 is a Diabetic Wound/Ulcer of the Lower Extremity located on the Right Metatarsal head first . An Soft Cast procedure was performed by Levan Hurst, RN. Post procedure Diagnosis Wound #1: Same as Pre-Procedure Plan Follow-up Appointments: Return Appointment in 1 week. - on Monday with Dr. Heber [place] Bathing/ Shower/ Hygiene: May shower with protection but do not get wound dressing(s) wet. Edema Control - Lymphedema / SCD / Other: Elevate legs to the level of the heart or above for 30 minutes daily and/or when sitting, a frequency of: - throughout the day Avoid standing for long periods of time. Off-Loading: Other: - Soft cast to right foot WOUND #1: - Metatarsal head first Wound Laterality: Right Cleanser: Soap and Water 1 x Per Week/ Discharge Instructions: May shower and wash wound with dial antibacterial soap and water prior to dressing change. Cleanser: Wound Cleanser 1 x Per Week/ Discharge Instructions: Cleanse the wound with wound cleanser prior to applying a clean dressing using gauze sponges, not tissue or cotton balls. Prim Dressing: KerraCel Ag Gelling Fiber Dressing, 4x5 in (silver alginate) 1 x Per Week/ ary Discharge Instructions: Apply silver alginate to wound bed as instructed Secondary Dressing: Woven Gauze Sponges 2x2 in 1 x Per Week/ Discharge Instructions: Apply over primary dressing as directed. Secondary Dressing: Drawtex 4x4 in 1 x Per Week/ Discharge Instructions: Apply over primary dressing as directed. Secured With: 2M Medipore H Soft Cloth Surgical T ape, 4 x 10 (in/yd) 1 x Per Week/ Discharge Instructions: Secure with tape as directed. WOUND #2: - Malleolus Wound Laterality: Right, Medial Cleanser: Soap and Water 1 x Per Week/ Discharge Instructions: May shower and  wash wound with dial antibacterial soap and water prior to dressing change. Cleanser: Wound Cleanser 1 x Per Week/ Discharge Instructions: Cleanse the wound with wound cleanser prior to applying a clean dressing using gauze sponges, not tissue or cotton balls. Prim Dressing: Promogran Prisma Matrix, 4.34 (sq in) (silver collagen) 1 x Per Week/ ary Discharge Instructions: Moisten collagen with saline or hydrogel Secondary Dressing: Woven Gauze Sponges 2x2 in 1 x Per Week/ Discharge Instructions: Apply over primary dressing as directed. Secondary Dressing: Drawtex 4x4 in 1 x Per Week/ Discharge Instructions: Apply over primary dressing as directed. Secured With: 2M Medipore H Soft Cloth Surgical T ape, 4 x 10 (in/yd) 1 x Per Week/ Discharge  Instructions: Secure with tape as directed. WOUND #3: - Foot Wound Laterality: Right, Lateral Cleanser: Soap and Water 1 x Per Week/ Discharge Instructions: May shower and wash wound with dial antibacterial soap and water prior to dressing change. Cleanser: Wound Cleanser 1 x Per Week/ Discharge Instructions: Cleanse the wound with wound cleanser prior to applying a clean dressing using gauze sponges, not tissue or cotton balls. Prim Dressing: Promogran Prisma Matrix, 4.34 (sq in) (silver collagen) 1 x Per Week/ ary Discharge Instructions: Moisten collagen with saline or hydrogel Secondary Dressing: Woven Gauze Sponges 2x2 in 1 x Per Week/ Discharge Instructions: Apply over primary dressing as directed. Secondary Dressing: Drawtex 4x4 in 1 x Per Week/ Discharge Instructions: Apply over primary dressing as directed. Secured With: 37M Medipore H Soft Cloth Surgical T ape, 4 x 10 (in/yd) 1 x Per Week/ Discharge Instructions: Secure with tape as directed. 1. Silver alginate 2. Collagen 3. Soft cast 4. Follow-up in 1 week 5. Aggressive offloading Electronic Signature(s) Signed: 01/11/2021 10:26:52 AM By: Kalman Shan DO Entered By: Kalman Shan on  01/11/2021 10:25:38 -------------------------------------------------------------------------------- HxROS Details Patient Name: Date of Service: [name], [name] 01/11/2021 8:45 A M Medical Record Number: [id number] Patient Account Number: [id number] Date of Birth/Sex: Treating RN: 07-07-1955 (65 y.o. [name] Primary Care Provider: [name] Other Clinician: Referring Provider: Treating Provider/Extender: [name] in Treatment: 3 Information Obtained From Patient Chart Eyes Medical History: Negative for: Cataracts; Glaucoma; Optic Neuritis Ear/Nose/Mouth/Throat Medical History: Negative for: Chronic sinus problems/congestion; Middle ear problems Hematologic/Lymphatic Medical History: Negative for: Anemia; Hemophilia; Human Immunodeficiency Virus; Lymphedema; Sickle Cell Disease Respiratory Medical History: Positive for: Sleep Apnea Negative for: Aspiration; Asthma; Chronic Obstructive Pulmonary Disease (COPD); Pneumothorax; Tuberculosis Cardiovascular Medical History: Positive for: Coronary Artery Disease; Hypertension Negative for: Angina; Arrhythmia; Congestive Heart Failure; Deep Vein Thrombosis; Hypotension; Myocardial Infarction; Peripheral Arterial Disease; Peripheral Venous Disease; Phlebitis; Vasculitis Past Medical History Notes: hypercholesterolemia Gastrointestinal Medical History: Negative for: Cirrhosis ; Colitis; Crohns; Hepatitis A; Hepatitis B; Hepatitis C Endocrine Medical History: Positive for: Type II Diabetes Negative for: Type I Diabetes Genitourinary Medical History: Negative for: End Stage Renal Disease Immunological Medical History: Negative for: Lupus Erythematosus; Raynauds; Scleroderma Integumentary (Skin) Medical History: Negative for: History of Burn Musculoskeletal Medical History: Negative for: Gout; Rheumatoid Arthritis; Osteoarthritis; Osteomyelitis Neurologic Medical  History: Positive for: Neuropathy Negative for: Dementia; Quadriplegia; Paraplegia; Seizure Disorder Immunizations Pneumococcal Vaccine: Received Pneumococcal Vaccination: Yes Received Pneumococcal Vaccination On or After 60th Birthday: Yes Tetanus Vaccine: Last tetanus shot: 12/17/2020 Implantable Devices No devices added Hospitalization / Surgery History Type of Hospitalization/Surgery 2nd and 3rd right toe amps total hysterectomy cholecystectomy 2 knee surgery's on left and 1 on right Family and Social History Unknown History: Yes; Current every day smoker - 1 pack/a/day; Alcohol Use: Never; Drug Use: No History; Caffeine Use: Moderate; Financial Concerns: No; Food, Clothing or Shelter Needs: No; Support System Lacking: No; Transportation Concerns: No Electronic Signature(s) Signed: 01/11/2021 10:26:52 AM By: Kalman Shan DO Signed: 01/11/2021 4:56:39 PM By: Lorrin Jackson Entered By: Kalman Shan on 01/11/2021 10:20:42 -------------------------------------------------------------------------------- SuperBill Details Patient Name: Date of Service: [name], [name] 01/11/2021 Medical Record Number: [id number] Patient Account Number: [id number] Date of Birth/Sex: Treating RN: [birth year] (65 y.o. [name] Primary Care Provider: [name] Other Clinician: Referring Provider: Treating Provider/Extender: [name] in Treatment: 3 Diagnosis Coding ICD-10 Codes Code Description (412) [phone] Type 2 diabetes mellitus with foot ulcer  L54.492 Non-pressure 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 E11.42 Type 2 diabetes mellitus with diabetic polyneuropathy F17.290 Nicotine dependence, other tobacco product, uncomplicated E10.071 Non-pressure chronic ulcer of other part of right foot limited to breakdown of skin Facility Procedures CPT4 Code: [id number] Description: Application of clubfoot cast with  molding or manipulation, long or short leg Modifier: Quantity: 1 Physician Procedures : CPT4 Code Description Modifier [id number] - WC PHYS LEVEL 3 - EST PT ICD-10 Diagnosis Description E11.621 Type 2 diabetes mellitus with foot ulcer L97.512 Non-pressure 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 L97.511  Non-pressure chronic ulcer of other part of right foot limited to breakdown of skin E11.42 Type 2 diabetes mellitus with diabetic polyneuropathy Quantity: 1 Electronic Signature(s) Signed: 01/11/2021 10:26:52 AM By: Kalman Shan DO Entered By: Kalman Shan on 01/11/2021 10:26:18

## 2021-01-12 NOTE — Progress Notes (Signed)
BONA, HUBBARD ([id number]) Visit Report for 01/11/2021 Arrival Information Details Patient Name: Date of Service: [name], [name] [name] 01/11/2021 8:45 A M Medical Record Number: [id number] Patient Account Number: [id number] Date of Birth/Sex: Treating RN: Apr 12, 1955 (65 y.o. [name] Primary Care Damyra Luscher: [name] Other Clinician: Referring Perline Awe: Treating Nadiya Pieratt/Extender: [name] in Treatment: 3 Visit Information History Since Last Visit Added or deleted any medications: No Patient Arrived: Ambulatory Any new allergies or adverse reactions: No Arrival Time: 08:42 Had a fall or experienced change in Yes Accompanied By: self activities of daily living that may affect Transfer Assistance: None risk of falls: Patient Identification Verified: Yes Signs or symptoms of abuse/neglect since last visito No Patient Requires Transmission-Based Precautions: No Hospitalized since last visit: No Patient Has Alerts: No Implantable device outside of the clinic excluding No cellular tissue based products placed in the center since last visit: Has Dressing in Place as Prescribed: Yes Has Compression in Place as Prescribed: Yes Pain Present Now: No Electronic Signature(s) Signed: 01/11/2021 3:01:27 PM By: Sandre Kitty Entered By: Sandre Kitty on 01/11/2021 09:05:09 -------------------------------------------------------------------------------- Lower Extremity Assessment Details Patient Name: Date of Service: [name], [name] 01/11/2021 8:45 A M Medical Record Number: [id number] Patient Account Number: [id number] Date of Birth/Sex: Treating RN: 1955/12/27 (65 y.o. [name] Primary Care [name]: [name] Other Clinician: Referring [name]: Treating Caspar Favila/Extender: Glenna Fellows Weeks in Treatment: 3 Edema Assessment Assessed: [Left: No] [Right: No] Edema:  [Left: Ye] [Right: s] Calf Left: Right: Point of Measurement: 32 cm From Medial Instep 44.5 cm Ankle Left: Right: Point of Measurement: 8 cm From Medial Instep 25 cm Electronic Signature(s) Signed: 01/11/2021 3:01:27 PM By: Sandre Kitty Signed: 01/11/2021 4:56:39 PM By: Lorrin Jackson Entered By: Sandre Kitty on 01/11/2021 09:07:22 -------------------------------------------------------------------------------- Multi Wound Chart Details Patient Name: Date of Service: [name], [name] 01/11/2021 8:45 A M Medical Record Number: [id number] Patient Account Number: [id number] Date of Birth/Sex: Treating RN: 1955-04-08 (65 y.o. [name] Primary Care [name]: [name] Other Clinician: Referring [name]: Treating [name]/Extender: [name] in Treatment: 3 Vital Signs Height(in): 55 Pulse(bpm): 30 Weight(lbs): 536 Blood Pressure(mmHg): 146/72 Body Mass Index(BMI): 43 Temperature(F): 98 Respiratory Rate(breaths/min): 18 Photos: Right Metatarsal head first Right, Medial Malleolus Right, Lateral Foot Wound Location: Gradually Appeared Shear/Friction Shear/Friction Wounding Event: Diabetic Wound/Ulcer of the Lower Diabetic Wound/Ulcer of the Lower Diabetic Wound/Ulcer of the Lower Primary Etiology: Extremity Extremity Extremity Sleep Apnea, Coronary Artery Sleep Apnea, Coronary Artery Sleep Apnea, Coronary Artery Comorbid History: Disease, Hypertension, Type II Disease, Hypertension, Type II Disease, Hypertension, Type II Diabetes, Neuropathy Diabetes, Neuropathy Diabetes, Neuropathy 10/29/2020 01/11/2021 01/11/2021 Date Acquired: 3 0 0 Weeks of Treatment: Open Open Open Wound Status: 0.2x0.2x0.1 0.4x0.4x0.1 0.4x0.5x0.1 Measurements L x W x D (cm) 0.031 0.126 0.157 A (cm) : rea 0.003 0.013 0.016 Volume (cm) : 92.70% 0.00% 0.00% % Reduction in A rea: 92.90% 0.00% 0.00% % Reduction in Volume: Grade  1 Grade 1 Grade 1 Classification: Medium Medium Medium Exudate A mount: Serosanguineous Serosanguineous Serosanguineous Exudate Type: red, brown red, brown red, brown Exudate Color: Distinct, outline attached Flat and Intact Flat and Intact Wound Margin: Large (67-100%) Large (67-100%) Large (67-100%) Granulation A mount: Red, Pink Red Red, Pink Granulation Quality: None Present (0%) None Present (0%) None Present (0%) Necrotic A mount: Fat Layer (Subcutaneous Tissue): Yes Fat Layer (Subcutaneous Tissue): Yes Fat Layer (  Subcutaneous Tissue): Yes Exposed Structures: Fascia: No Fascia: No Fascia: No Tendon: No Tendon: No Tendon: No Muscle: No Muscle: No Muscle: No Joint: No Joint: No Joint: No Bone: No Bone: No Bone: No None None None Epithelialization: Soft Cast N/A N/A Procedures Performed: Treatment Notes Electronic Signature(s) Signed: 01/11/2021 10:26:52 AM By: Kalman Shan DO Signed: 01/11/2021 4:56:39 PM By: Lorrin Jackson Entered By: Kalman Shan on 01/11/2021 10:18:05 -------------------------------------------------------------------------------- Multi-Disciplinary Care Plan Details Patient Name: Date of Service: [name], [name] 01/11/2021 8:45 A M Medical Record Number: [id number] Patient Account Number: [id number] Date of Birth/Sex: Treating RN: 01/15/1956 (65 y.o. [name] Primary Care Raad Clayson: [name] Other Clinician: Referring Cleveland Paiz: Treating Annamae Shivley/Extender: [name] in Treatment: 3 Active Inactive Nutrition Nursing Diagnoses: Impaired glucose control: actual or potential Goals: Patient/caregiver verbalizes understanding of need to maintain therapeutic glucose control per primary care physician Date Initiated: 12/17/2020 Target Resolution Date: 01/21/2021 Goal Status: Active Interventions: Assess HgA1c results as ordered upon admission and as needed Assess patient  nutrition upon admission and as needed per policy Provide education on elevated blood sugars and impact on wound healing Notes: Wound/Skin Impairment Nursing Diagnoses: Impaired tissue integrity Goals: Patient/caregiver will verbalize understanding of skin care regimen Date Initiated: 12/17/2020 Target Resolution Date: 01/21/2021 Goal Status: Active Ulcer/skin breakdown will have a volume reduction of 30% by week 4 Date Initiated: 12/17/2020 Target Resolution Date: 01/21/2021 Goal Status: Active Interventions: Assess patient/caregiver ability to obtain necessary supplies Assess patient/caregiver ability to perform ulcer/skin care regimen upon admission and as needed Assess ulceration(s) every visit Provide education on ulcer and skin care Treatment Activities: Topical wound management initiated : 12/17/2020 Notes: Electronic Signature(s) Signed: 01/11/2021 5:11:19 PM By: Levan Hurst RN, BSN Entered By: Levan Hurst on 01/11/2021 09:19:08 -------------------------------------------------------------------------------- Pain Assessment Details Patient Name: Date of Service: [name], [name] 01/11/2021 8:45 A M Medical Record Number: [id number] Patient Account Number: [id number] Date of Birth/Sex: Treating RN: Feb 12, 1956 (65 y.o. [name] Primary Care Corbett Moulder: Other Clinician: [name] Referring Dayannara Pascal: Treating Sadonna Kotara/Extender: [name] in Treatment: 3 Active Problems Location of Pain Severity and Description of Pain Patient Has Paino No Site Locations Pain Management and Medication Current Pain Management: Electronic Signature(s) Signed: 01/11/2021 4:56:39 PM By: Lorrin Jackson Signed: 01/12/2021 9:52:13 AM By: Dellie Catholic RN Entered By: Dellie Catholic on 01/11/2021 08:46:32 -------------------------------------------------------------------------------- Patient/Caregiver Education  Details Patient Name: Date of Service: [name], [name] 11/21/2022andnbsp8:45 A M Medical Record Number: [id number] Patient Account Number: [id number] Date of Birth/Gender: Treating RN: 1955-09-17 (65 y.o. [name] Primary Care Physician: [name] Other Clinician: Referring Physician: Treating Physician/Extender: [name] in Treatment: 3 Education Assessment Education Provided To: Patient Education Topics Provided Wound/Skin Impairment: Methods: Explain/Verbal Responses: State content correctly Electronic Signature(s) Signed: 01/11/2021 5:11:19 PM By: Levan Hurst RN, BSN Entered By: Levan Hurst on 01/11/2021 09:19:21 -------------------------------------------------------------------------------- Wound Assessment Details Patient Name: Date of Service: [name], [name] 01/11/2021 8:45 A M Medical Record Number: [id number] Patient Account Number: [id number] Date of Birth/Sex: Treating RN: June 01, 1955 (65 y.o. [name] Primary Care Dylin Breeden: [name] Other Clinician: Referring Lamontae Ricardo: Treating Jisel Fleet/Extender: [name] in Treatment: 3 Wound Status Wound Number: 1 Primary Diabetic Wound/Ulcer of the Lower Extremity Etiology: Wound Location: Right Metatarsal head first Wound Open Wounding Event: Gradually Appeared Status: Date Acquired: 10/29/2020 Comorbid Sleep Apnea, Coronary  Artery Disease, Hypertension, Type II Weeks Of Treatment: 3 History: Diabetes, Neuropathy Clustered Wound: No Photos Wound Measurements Length: (cm) 0.2 Width: (cm) 0.2 Depth: (cm) 0.1 Area: (cm) 0.031 Volume: (cm) 0.003 % Reduction in Area: 92.7% % Reduction in Volume: 92.9% Epithelialization: None Wound Description Classification: Grade 1 Wound Margin: Distinct, outline attached Exudate Amount: Medium Exudate Type: Serosanguineous Exudate Color: red,  brown Foul Odor After Cleansing: No Slough/Fibrino No Wound Bed Granulation Amount: Large (67-100%) Exposed Structure Granulation Quality: Red, Pink Fascia Exposed: No Necrotic Amount: None Present (0%) Fat Layer (Subcutaneous Tissue) Exposed: Yes Tendon Exposed: No Muscle Exposed: No Joint Exposed: No Bone Exposed: No Electronic Signature(s) Signed: 01/11/2021 3:01:27 PM By: Sandre Kitty Signed: 01/11/2021 4:56:39 PM By: Lorrin Jackson Entered By: Sandre Kitty on 01/11/2021 08:41:43 -------------------------------------------------------------------------------- Wound Assessment Details Patient Name: Date of Service: [name], [name] 01/11/2021 8:45 A M Medical Record Number: [id number] Patient Account Number: [id number] Date of Birth/Sex: Treating RN: [birth year] (65 y.o. [name] Primary Care Jaspal Pultz: [name] Other Clinician: Referring Syble Picco: Treating Shakerra Red/Extender: [name] in Treatment: 3 Wound Status Wound Number: 2 Primary Diabetic Wound/Ulcer of the Lower Extremity Etiology: Wound Location: Right, Medial Malleolus Wound Open Wounding Event: Shear/Friction Status: Date Acquired: 01/11/2021 Comorbid Sleep Apnea, Coronary Artery Disease, Hypertension, Type II Weeks Of Treatment: 0 History: Diabetes, Neuropathy Clustered Wound: No Photos Wound Measurements Length: (cm) 0.4 Width: (cm) 0.4 Depth: (cm) 0.1 Area: (cm) 0.126 Volume: (cm) 0.013 % Reduction in Area: 0% % Reduction in Volume: 0% Epithelialization: None Tunneling: No Undermining: No Wound Description Classification: Grade 1 Wound Margin: Flat and Intact Exudate Amount: Medium Exudate Type: Serosanguineous Exudate Color: red, brown Foul Odor After Cleansing: No Slough/Fibrino No Wound Bed Granulation Amount: Large (67-100%) Exposed Structure Granulation Quality: Red Fascia Exposed: No Necrotic Amount: None  Present (0%) Fat Layer (Subcutaneous Tissue) Exposed: Yes Tendon Exposed: No Muscle Exposed: No Joint Exposed: No Bone Exposed: No Electronic Signature(s) Signed: 01/11/2021 3:01:27 PM By: Sandre Kitty Signed: 01/11/2021 4:56:39 PM By: Lorrin Jackson Entered By: Sandre Kitty on 01/11/2021 09:06:13 -------------------------------------------------------------------------------- Wound Assessment Details Patient Name: Date of Service: [name], [name] 01/11/2021 8:45 A M Medical Record Number: [id number] Patient Account Number: [id number] Date of Birth/Sex: Treating RN: 1955-05-29 (65 y.o. [name] Primary Care Gable Odonohue: [name] Other Clinician: Referring Jolynne Spurgin: Treating Ryzen Deady/Extender: [name] in Treatment: 3 Wound Status Wound Number: 3 Primary Diabetic Wound/Ulcer of the Lower Extremity Etiology: Wound Location: Right, Lateral Foot Wound Open Wounding Event: Shear/Friction Status: Date Acquired: 01/11/2021 Comorbid Sleep Apnea, Coronary Artery Disease, Hypertension, Type II Weeks Of Treatment: 0 History: Diabetes, Neuropathy Clustered Wound: No Photos Wound Measurements Length: (cm) 0.4 Width: (cm) 0.5 Depth: (cm) 0.1 Area: (cm) 0.157 Volume: (cm) 0.016 % Reduction in Area: 0% % Reduction in Volume: 0% Epithelialization: None Tunneling: No Undermining: No Wound Description Classification: Grade 1 Wound Margin: Flat and Intact Exudate Amount: Medium Exudate Type: Serosanguineous Exudate Color: red, brown Foul Odor After Cleansing: No Slough/Fibrino No Wound Bed Granulation Amount: Large (67-100%) Exposed Structure Granulation Quality: Red, Pink Fascia Exposed: No Necrotic Amount: None Present (0%) Fat Layer (Subcutaneous Tissue) Exposed: Yes Tendon Exposed: No Muscle Exposed: No Joint Exposed: No Bone Exposed: No Electronic Signature(s) Signed: 01/11/2021 3:01:27 PM By:  Sandre Kitty Signed: 01/11/2021 4:56:39 PM By: Lorrin Jackson Entered By: Sandre Kitty on 01/11/2021 09:06:59 -------------------------------------------------------------------------------- Vitals Details Patient Name: Date of Service: [name],  [name] 01/11/2021 8:45 A M Medical Record Number: [id number] Patient Account Number: [id number] Date of Birth/Sex: Treating RN: [birth year] (65 y.o. [name] Primary Care Onalee Steinbach: [name] Other Clinician: Referring Locke Barrell: Treating Kvion Shapley/Extender: [name] in Treatment: 3 Vital Signs Time Taken: 08:38 Temperature (F): 98 Height (in): 67 Pulse (bpm): 87 Weight (lbs): 277 Respiratory Rate (breaths/min): 18 Body Mass Index (BMI): 43.4 Blood Pressure (mmHg): 146/72 Reference Range: 80 - 120 mg / dl Electronic Signature(s) Signed: 01/12/2021 9:52:13 AM By: Dellie Catholic RN Entered By: Dellie Catholic on 01/11/2021 08:45:19

## 2021-01-12 NOTE — Progress Notes (Signed)
[name], [name] ([id number]) Visit Report for 01/11/2021 Fall Risk Assessment Details Patient Name: Date of Service: [name], [name] 01/11/2021 8:45 A M Medical Record Number: [id number] Patient Account Number: [id number] Date of Birth/Sex: Treating RN: October 25, 1955 (65 y.o. [name] Primary Care Breanna Shorkey: [name] Other Clinician: Referring Erandi Lemma: Treating Easten Maceachern/Extender: [name] in Treatment: 3 Fall Risk Assessment Items Have you had 2 or more falls in the last 12 monthso 0 No Have you had any fall that resulted in injury in the last 12 monthso 0 Yes FALLS RISK SCREEN History of falling - immediate or within 3 months 25 Yes Secondary diagnosis (Do you have 2 or more medical diagnoseso) 0 No Ambulatory aid None/bed rest/wheelchair/nurse 0 No Crutches/cane/walker 0 No Furniture 0 No Intravenous therapy Access/Saline/Heparin Lock 0 No Gait/Transferring Normal/ bed rest/ wheelchair 0 No Weak (short steps with or without shuffle, stooped but able to lift head while walking, may seek 0 No support from furniture) Impaired (short steps with shuffle, may have difficulty arising from chair, head down, impaired 0 No balance) Mental Status Oriented to own ability 0 No Notes Above left eye has a hematoma Electronic Signature(s) Signed: 01/11/2021 4:56:39 PM By: Lorrin Jackson Signed: 01/12/2021 9:52:13 AM By: Dellie Catholic RN Entered By: Dellie Catholic on 01/11/2021 09:09:17

## 2021-01-14 NOTE — Progress Notes (Signed)
   Subjective:  65 y.o. female with PMHx of diabetes mellitus presenting today for f routine foot care.  She is being managed at the [facility] wound care center for evaluation and treatment of her wounds to the right foot.  She says that she is doing very well.  She has a total contact cast intact to the right lower extremity.  She presents for routine foot care of her left foot.   Past Medical History:  Diagnosis Date   Anxiety    Arthritis    knees   Coronary artery disease    Depression    Fatty liver    History of nuclear stress test 12/16/2011   exercise myoview; normal images with 2-19mm ST-segment depression - subsequent cath revelaed subtotally occluded small 2nd marginal branch & 75% PDA lesion, normal LV function   Hypertension    Hypothyroidism    Neuropathy    in feet   OSA on CPAP    AHI = 44 (per patient) setting 13 per pt   Personal history of kidney stones    Primary localized osteoarthritis of left knee 04/24/2015   Primary localized osteoarthritis of right knee 07/02/2015   Tobacco abuse    Type 2 diabetes mellitus (HCC)    insulin pump      Objective/Physical Exam General: The patient is alert and oriented x3 in no acute distress.  Dermatology:  Total contact cast intact to the right lower extremity.  Hyperkeratotic elongated dystrophic nails noted 1-5 left foot.  Hyperkeratotic preulcerative calluses also noted to the left forefoot  Skin is warm, dry and supple bilateral lower extremities.  Vascular: Palpable pedal pulses bilaterally. No edema or erythema noted. Capillary refill within normal limits.  Neurological: Epicritic and protective threshold diminished bilaterally.   Musculoskeletal Exam: History of prior second and third toe amputations  Radiographic exam RT foot 11/10/2020: No evidence of osteolytic erosion or cortical irregularity that would be concerning for osteomyelitis at the moment.  History of prior toe amputations at the MTP second and  third toes.  Diffuse degenerative changes noted consistent with chronic osteoarthritis with possible Charcot degenerative changes of the midfoot given the patient's history of diabetes mellitus  Assessment: 1.  Ulcer subfirst MTPJ right foot secondary to diabetes mellitus 2. diabetes mellitus w/ peripheral neuropathy 3.  Pain due to onychomycosis of toenails left 4.  Preulcerative callus lesions left foot   Plan of Care:  1. Patient was evaluated. 2.  The total contact cast is intact to the right foot.  Continue management at the wound care center.  Their assistance is greatly appreciated management of the patient and the wound 3.  Mechanical debridement of the nails and the calluses was performed to the left foot using a nail nipper and tissue nipper.  No bleeding noted. 4.  Return to clinic in 3 months for routine foot care  *Husband's name is Jenny Reichmann. Pet dog is LullaBell  Edrick Kins, DPM [facility]  Dr. Edrick Kins, DPM    [address], [place] [address]                Office [phone]  Fax [phone]

## 2021-01-18 ENCOUNTER — Encounter (HOSPITAL_BASED_OUTPATIENT_CLINIC_OR_DEPARTMENT_OTHER): Payer: Medicare Other | Admitting: Internal Medicine

## 2021-01-18 ENCOUNTER — Other Ambulatory Visit: Payer: Self-pay

## 2021-01-18 DIAGNOSIS — E11621 Type 2 diabetes mellitus with foot ulcer: Secondary | ICD-10-CM | POA: Diagnosis not present

## 2021-01-18 DIAGNOSIS — Z794 Long term (current) use of insulin: Secondary | ICD-10-CM | POA: Diagnosis not present

## 2021-01-18 DIAGNOSIS — F17210 Nicotine dependence, cigarettes, uncomplicated: Secondary | ICD-10-CM | POA: Diagnosis not present

## 2021-01-18 DIAGNOSIS — L03115 Cellulitis of right lower limb: Secondary | ICD-10-CM | POA: Diagnosis not present

## 2021-01-18 DIAGNOSIS — E1142 Type 2 diabetes mellitus with diabetic polyneuropathy: Secondary | ICD-10-CM | POA: Diagnosis not present

## 2021-01-18 DIAGNOSIS — L97512 Non-pressure 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: Secondary | ICD-10-CM

## 2021-01-18 DIAGNOSIS — L97511 Non-pressure chronic ulcer of other part of right foot limited to breakdown of skin: Secondary | ICD-10-CM | POA: Diagnosis not present

## 2021-01-18 NOTE — Progress Notes (Signed)
[name], [name] ([id number]) Visit Report for 01/18/2021 Chief Complaint Document Details Patient Name: Date of Service: [name], [name] 01/18/2021 10:00 [name] M Medical Record Number: [id number] Patient Account Number: [id number] Date of Birth/Sex: Treating RN: 1955-10-24 (65 y.o. [name] Primary Care Provider: [name] Other Clinician: Referring Provider: Treating Provider/Extender: [name] in Treatment: 4 Information Obtained from: Patient Chief Complaint 10/27; Right Plantar foot wound 11/21; 2 small areas of skin breakdown to the right foot following cast placement Electronic Signature(s) Signed: 01/18/2021 12:04:18 PM By: Kalman Shan DO Entered By: Kalman Shan on 01/18/2021 11:53:25 -------------------------------------------------------------------------------- Debridement Details Patient Name: Date of Service: [name], [name] 01/18/2021 10:00 [name] M Medical Record Number: [id number] Patient Account Number: [id number] Date of Birth/Sex: Treating RN: 08-30-1955 (65 y.o. [name] Primary Care Provider: [name] Other Clinician: Referring Provider: Treating Provider/Extender: [name] in Treatment: 4 Debridement Performed for Assessment: Wound #3 Right,Lateral Foot Performed By: Physician Kalman Shan, DO Debridement Type: Debridement Severity of Tissue Pre Debridement: Fat layer exposed Level of Consciousness (Pre-procedure): Awake and Alert Pre-procedure Verification/Time Out Yes - 10:44 Taken: Start Time: 10:47 Pain Control: Other : Benzocaine T Area Debrided (L x W): otal 0.4 (cm) x 0.5 (cm) = 0.2 (cm) Tissue and other material debrided: Non-Viable, Slough, Subcutaneous, Slough Level: Skin/Subcutaneous Tissue Debridement Description: Excisional Instrument: Curette, Forceps, Scissors Bleeding: Minimum Hemostasis Achieved:  Pressure End Time: 10:50 Response to Treatment: Procedure was tolerated well Level of Consciousness (Post- Awake and Alert procedure): Post Debridement Measurements of Total Wound Length: (cm) 0.4 Width: (cm) 0.5 Depth: (cm) 0.2 Volume: (cm) 0.031 Character of Wound/Ulcer Post Debridement: Stable Severity of Tissue Post Debridement: Fat layer exposed Post Procedure Diagnosis Same as Pre-procedure Electronic Signature(s) Signed: 01/18/2021 12:04:18 PM By: Kalman Shan DO Signed: 01/18/2021 6:07:33 PM By: Lorrin Jackson Entered By: Lorrin Jackson on 01/18/2021 10:54:21 -------------------------------------------------------------------------------- Debridement Details Patient Name: Date of Service: [name], [name] 01/18/2021 10:00 [name] M Medical Record Number: [id number] Patient Account Number: [id number] Date of Birth/Sex: Treating RN: 20-Oct-1955 (65 y.o. [name] Primary Care Provider: [name] Other Clinician: Referring Provider: Treating Provider/Extender: [name] in Treatment: 4 Debridement Performed for Assessment: Wound #1 Right Metatarsal head first Performed By: Physician Kalman Shan, DO Debridement Type: Debridement Severity of Tissue Pre Debridement: Fat layer exposed Level of Consciousness (Pre-procedure): Awake and Alert Pre-procedure Verification/Time Out Yes - 10:44 Taken: Start Time: 10:45 Pain Control: Other : Benzocaine T Area Debrided (L x W): otal 0.3 (cm) x 0.3 (cm) = 0.09 (cm) Tissue and other material debrided: Callus, Skin: Dermis Level: Skin/Dermis Debridement Description: Selective/Open Wound Instrument: Curette Bleeding: Minimum Hemostasis Achieved: Pressure End Time: 10:47 Response to Treatment: Procedure was tolerated well Level of Consciousness (Post- Awake and Alert procedure): Post Debridement Measurements of Total Wound Length: (cm) 0.1 Width: (cm) 0.1 Depth:  (cm) 0.1 Volume: (cm) 0.001 Character of Wound/Ulcer Post Debridement: Stable Severity of Tissue Post Debridement: Fat layer exposed Post Procedure Diagnosis Same as Pre-procedure Electronic Signature(s) Signed: 01/18/2021 12:04:18 PM By: Kalman Shan DO Signed: 01/18/2021 6:07:33 PM By: Lorrin Jackson Entered By: Lorrin Jackson on 01/18/2021 10:54:44 -------------------------------------------------------------------------------- HPI Details Patient Name: Date of Service: [name], [name] 01/18/2021 10:00 [name] M Medical Record Number: [id number] Patient Account Number: [id number] Date of Birth/Sex: Treating RN: August 10, 1955 (65 y.o. [name] Primary Care Provider: Other Clinician:  [name] Referring Provider: Treating Provider/Extender: [name] in Treatment: 4 History of Present Illness HPI Description: Admission 12/17/2020 Ms. Margo Lama is [name] 65 year old female with [name] past medical history of insulin-dependent type 2 diabetes, hypothyroidism and daily1 pack per day cigarette smoker the presents to the clinic for [name] 6-week history of nonhealing wound to the right first MTPJ. She has been following with [name], podiatry for this issue. She has been using silver alginate with dressing changes. She uses [name] postsurgical shoe and offloading pads. She currently denies signs of infection. 10/31; patient presents for follow-up. She tolerated the soft cast fine although she states that she felt her foot rolling to one side. She denies signs of infection. She would like to do the total contact cast today. 12/23/2020 upon evaluation today patient appears to be doing excellent in regard to her wound on the foot and she is in [name] total contact cast. I do think this is appropriate this is the first cast change which we are obliged to do to ensure nothing is rubbing everything appears to be doing quite well and very pleased in that  regard. 11/7; patient presents for follow-up. She had no issues with the cast. She denies signs of infection. 11/14; patient presents for 1 week follow-up. She has had no issues with the cast. She denies signs of infection. 11/21; patient presents for 1 week follow-up. She did develop 2 small areas of skin breakdown on either side of the right foot from the cast rubbing. She states she did not feel the wounds developing. She currently denies signs of infection. 11/28; patient presents for 1 week follow-up. She has no issues or complaints today. She denies pain or acute signs of infection. Electronic Signature(s) Signed: 01/18/2021 12:04:18 PM By: Kalman Shan DO Entered By: Kalman Shan on 01/18/2021 11:53:55 -------------------------------------------------------------------------------- Physical Exam Details Patient Name: Date of Service: [name], [name] 01/18/2021 10:00 [name] M Medical Record Number: [id number] Patient Account Number: [id number] Date of Birth/Sex: Treating RN: June 20, 1955 (65 y.o. [name] Primary Care Provider: [name] Other Clinician: Referring Provider: Treating Provider/Extender: [name] in Treatment: 4 Constitutional respirations regular, non-labored and within target range for patient.. Cardiovascular 2+ dorsalis pedis/posterior tibialis pulses. Psychiatric pleasant and cooperative. Notes Right first met head with epithelialization to previous wound site. T the right medial malleolus there is [name] scab. T the right lateral foot there is increased warmth o o and redness to the surrounding wound bed. There is undermining and nonviable tissue present. Electronic Signature(s) Signed: 01/18/2021 12:04:18 PM By: Kalman Shan DO Entered By: Kalman Shan on 01/18/2021 11:54:39 -------------------------------------------------------------------------------- Physician Orders Details Patient  Name: Date of Service: [name], [name] 01/18/2021 10:00 [name] M Medical Record Number: [id number] Patient Account Number: [id number] Date of Birth/Sex: Treating RN: Apr 29, 1955 (65 y.o. [name] Primary Care Provider: [name] Other Clinician: Referring Provider: Treating Provider/Extender: [name] in Treatment: 4 Verbal / Phone Orders: No Diagnosis Coding ICD-10 Coding Code Description E11.621 Type 2 diabetes mellitus with foot ulcer L97.512 Non-pressure 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 E11.42 Type 2 diabetes mellitus with diabetic polyneuropathy F17.290 Nicotine dependence, other tobacco product, uncomplicated B28.413 Non-pressure chronic ulcer of other part of right foot limited to breakdown of skin Follow-up Appointments ppointment in 1 week. - with Dr. Heber Elmore Return [name] Nurse Visit: - Thursday 01/21/21  Other: - Prescription for antibiotic sent to pharmacy. Pick up prescription and take as directed. Bathing/ Shower/ Hygiene May shower with protection but do not get wound dressing(s) wet. Edema Control - Lymphedema / SCD / Other Elevate legs to the level of the heart or above for 30 minutes daily and/or when sitting, [name] frequency of: - throughout the day Avoid standing for long periods of time. Off-Loading Open toe surgical shoe to: - to right foot Wound Treatment Wound #1 - Metatarsal head first Wound Laterality: Right Cleanser: Soap and Water 1 x Per Day/15 Days Discharge Instructions: May shower and wash wound with dial antibacterial soap and water prior to dressing change. Cleanser: Wound Cleanser 1 x Per Day/15 Days Discharge Instructions: Cleanse the wound with wound cleanser prior to applying [name] clean dressing using gauze sponges, not tissue or cotton balls. Prim Dressing: KerraCel Ag Gelling Fiber Dressing, 4x5 in (silver alginate) 1 x Per Day/15 Days ary Discharge Instructions:  Apply silver alginate to wound bed as instructed Secondary Dressing: Woven Gauze Sponges 2x2 in 1 x Per Day/15 Days Discharge Instructions: Apply over primary dressing as directed. Secured With: [employer], 4.5x3.1 (in/yd) 1 x Per Day/15 Days Discharge Instructions: Secure with Kerlix as directed. Secured With: 48M Medipore H Soft Cloth Surgical T ape, 4 x 10 (in/yd) 1 x Per Day/15 Days Discharge Instructions: Secure with tape as directed. Wound #3 - Foot Wound Laterality: Right, Lateral Cleanser: Soap and Water 1 x Per Week Discharge Instructions: May shower and wash wound with dial antibacterial soap and water prior to dressing change. Cleanser: Wound Cleanser 1 x Per Week Discharge Instructions: Cleanse the wound with wound cleanser prior to applying [name] clean dressing using gauze sponges, not tissue or cotton balls. Topical: Gentamicin 1 x Per Week Discharge Instructions: As directed by physician Prim Dressing: KerraCel Ag Gelling Fiber Dressing, 2x2 in (silver alginate) 1 x Per Week ary Discharge Instructions: Apply silver alginate to wound bed as instructed Secondary Dressing: Woven Gauze Sponges 2x2 in 1 x Per Week Discharge Instructions: Apply over primary dressing as directed. Secured With: [employer], 4.5x3.1 (in/yd) 1 x Per Week Discharge Instructions: Secure with Kerlix as directed. Secured With: 48M Medipore H Soft Cloth Surgical Tape, 4 x 10 (in/yd) 1 x Per Week Discharge Instructions: Secure with tape as directed. Patient Medications llergies: No Known Allergies [name] Notifications Medication Indication Start End 01/18/2021 doxycycline hyclate DOSE 1 - oral 100 mg tablet - 1 tablet oral BID x 10 days 01/18/2021 Augmentin DOSE 1 - oral 875 mg-125 mg tablet - 1 tablet oral BID x 10 days Electronic Signature(s) Signed: 01/18/2021 12:04:18 PM By: Kalman Shan DO Previous Signature: 01/18/2021 11:12:59 AM Version By: Kalman Shan DO Entered By: Kalman Shan on 01/18/2021 11:54:57 -------------------------------------------------------------------------------- Problem List Details Patient Name: Date of Service: [place], [name] 01/18/2021 10:00 [name] M Medical Record Number: [id number] Patient Account Number: [id number] Date of Birth/Sex: Treating RN: September 02, 1955 (66 y.o. [name] Primary Care Provider: [name] Other Clinician: Referring Provider: Treating Provider/Extender: [name] in Treatment: 4 Active Problems ICD-10 Encounter Code Description Active Date MDM Diagnosis E11.621 Type 2 diabetes mellitus with foot ulcer 12/17/2020 No Yes L97.512 Non-pressure 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 12/21/2020 No Yes E11.42 Type 2 diabetes mellitus with diabetic polyneuropathy 12/17/2020 No Yes F17.290 Nicotine dependence, other tobacco product, uncomplicated 21/97/5883 No Yes L97.511 Non-pressure chronic ulcer of other part  of right foot limited to breakdown of 01/11/2021 No Yes skin Inactive Problems Resolved Problems Electronic Signature(s) Signed: 01/18/2021 12:04:18 PM By: Kalman Shan DO Previous Signature: 01/18/2021 10:43:43 AM Version By: Lorrin Jackson Entered By: Kalman Shan on 01/18/2021 11:52:54 -------------------------------------------------------------------------------- Progress Note Details Patient Name: Date of Service: [name], [name] 01/18/2021 10:00 [name] M Medical Record Number: [id number] Patient Account Number: [id number] Date of Birth/Sex: Treating RN: October 14, 1955 (65 y.o. [name] Primary Care Provider: [name] Other Clinician: Referring Provider: Treating Provider/Extender: [name] in Treatment: 4 Subjective Chief Complaint Information obtained from Patient 10/27; Right Plantar foot wound 11/21; 2 small areas of skin breakdown to the right foot  following cast placement History of Present Illness (HPI) Admission 12/17/2020 Ms. Tria Noguera is [name] 65 year old female with [name] past medical history of insulin-dependent type 2 diabetes, hypothyroidism and daily1 pack per day cigarette smoker the presents to the clinic for [name] 6-week history of nonhealing wound to the right first MTPJ. She has been following with [name], podiatry for this issue. She has been using silver alginate with dressing changes. She uses [name] postsurgical shoe and offloading pads. She currently denies signs of infection. 10/31; patient presents for follow-up. She tolerated the soft cast fine although she states that she felt her foot rolling to one side. She denies signs of infection. She would like to do the total contact cast today. 12/23/2020 upon evaluation today patient appears to be doing excellent in regard to her wound on the foot and she is in [name] total contact cast. I do think this is appropriate this is the first cast change which we are obliged to do to ensure nothing is rubbing everything appears to be doing quite well and very pleased in that regard. 11/7; patient presents for follow-up. She had no issues with the cast. She denies signs of infection. 11/14; patient presents for 1 week follow-up. She has had no issues with the cast. She denies signs of infection. 11/21; patient presents for 1 week follow-up. She did develop 2 small areas of skin breakdown on either side of the right foot from the cast rubbing. She states she did not feel the wounds developing. She currently denies signs of infection. 11/28; patient presents for 1 week follow-up. She has no issues or complaints today. She denies pain or acute signs of infection. Patient History Information obtained from Patient, Chart. Family History Unknown History. Social History Current every day smoker - 1 pack/[name]/day, Alcohol Use - Never, Drug Use - No History, Caffeine Use - Moderate. Medical  History Eyes Denies history of Cataracts, Glaucoma, Optic Neuritis Ear/Nose/Mouth/Throat Denies history of Chronic sinus problems/congestion, Middle ear problems Hematologic/Lymphatic Denies history of Anemia, Hemophilia, Human Immunodeficiency Virus, Lymphedema, Sickle Cell Disease Respiratory Patient has history of Sleep Apnea Denies history of Aspiration, Asthma, Chronic Obstructive Pulmonary Disease (COPD), Pneumothorax, Tuberculosis Cardiovascular Patient has history of Coronary Artery Disease, Hypertension Denies history of Angina, Arrhythmia, Congestive Heart Failure, Deep Vein Thrombosis, Hypotension, Myocardial Infarction, Peripheral Arterial Disease, Peripheral Venous Disease, Phlebitis, Vasculitis Gastrointestinal Denies history of Cirrhosis , Colitis, Crohnoos, Hepatitis [name], Hepatitis B, Hepatitis C Endocrine Patient has history of Type II Diabetes Denies history of Type I Diabetes Genitourinary Denies history of End Stage Renal Disease Immunological Denies history of Lupus Erythematosus, Raynaudoos, Scleroderma Integumentary (Skin) Denies history of History of Burn Musculoskeletal Denies history of Gout, Rheumatoid Arthritis, Osteoarthritis, Osteomyelitis Neurologic Patient has history of Neuropathy Denies history of Dementia, Quadriplegia,  Paraplegia, Seizure Disorder Hospitalization/Surgery History - 2nd and 3rd right toe amps. - total hysterectomy. - cholecystectomy. - 2 knee surgery's on left and 1 on right. Medical [name] Surgical History Notes nd Cardiovascular hypercholesterolemia Objective Constitutional respirations regular, non-labored and within target range for patient.. Vitals Time Taken: 10:10 AM, Height: 67 in, Weight: 277 lbs, BMI: 43.4, Temperature: 97.7 F, Pulse: 65 bpm, Respiratory Rate: 18 breaths/min, Blood Pressure: 126/60 mmHg. Cardiovascular 2+ dorsalis pedis/posterior tibialis pulses. Psychiatric pleasant and cooperative. General Notes:  Right first met head with epithelialization to previous wound site. T the right medial malleolus there is [name] scab. T the right lateral foot there is o o increased warmth and redness to the surrounding wound bed. There is undermining and nonviable tissue present. Integumentary (Hair, Skin) Wound #1 status is Open. Original cause of wound was Gradually Appeared. The date acquired was: 10/29/2020. The wound has been in treatment 4 weeks. The wound is located on the Right Metatarsal head first. The wound measures 0.1cm length x 0.1cm width x 0.1cm depth; 0.008cm^2 area and 0.001cm^3 volume. There is Fat Layer (Subcutaneous Tissue) exposed. There is no tunneling or undermining noted. There is [name] medium amount of serosanguineous drainage noted. The wound margin is distinct with the outline attached to the wound base. There is large (67-100%) red, pink granulation within the wound bed. There is no necrotic tissue within the wound bed. Wound #2 status is Healed - Epithelialized. Original cause of wound was Shear/Friction. The date acquired was: 01/11/2021. The wound has been in treatment 1 weeks. The wound is located on the Right,Medial Malleolus. The wound measures 0cm length x 0cm width x 0cm depth; 0cm^2 area and 0cm^3 volume. Wound #3 status is Open. Original cause of wound was Shear/Friction. The date acquired was: 01/11/2021. The wound has been in treatment 1 weeks. The wound is located on the Right,Lateral Foot. The wound measures 0.4cm length x 0.5cm width x 0.2cm depth; 0.157cm^2 area and 0.031cm^3 volume. There is Fat Layer (Subcutaneous Tissue) exposed. There is no tunneling noted, however, there is undermining starting at 12:00 and ending at 12:00 with [name] maximum distance of 0.3cm. There is [name] medium amount of serosanguineous drainage noted. The wound margin is flat and intact. There is large (67-100%) red, pink granulation within the wound bed. There is no necrotic tissue within the wound  bed. Assessment Active Problems ICD-10 Type 2 diabetes mellitus with foot ulcer Non-pressure 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 Type 2 diabetes mellitus with diabetic polyneuropathy Nicotine dependence, other tobacco product, uncomplicated Non-pressure chronic ulcer of other part of right foot limited to breakdown of skin Patient's plantar foot wound appears healed. There was some dried drainage that was removed and there was epithelialization under this. I recommended continuing silver alginate in case any drainage reappears. Her right medial malleolus wound has [name] scab and appears well-healing. Unfortunately her right lateral foot wound has gotten worse. This originally developed from the hard cast as [name] small area of skin breakdown. The hard cast was placed on hold because of this issue. She was in [name] soft cast for the past week. At this time I will start her on doxycycline and Augmentin. She can use silver alginate to this area for dressing changes. She denies any allergies to antibiotics including penicillins. I would like for her to follow-up later in the week to assure things have gotten better. The cellulitis was outlined and I recommended she visit the ED if symptoms  worsened. Procedures Wound #1 Pre-procedure diagnosis of Wound #1 is [name] Diabetic Wound/Ulcer of the Lower Extremity located on the Right Metatarsal head first .Severity of Tissue Pre Debridement is: Fat layer exposed. There was [name] Selective/Open Wound Skin/Dermis Debridement with [name] total area of 0.09 sq cm performed by Kalman Shan, DO. With the following instrument(s): Curette Material removed includes Callus and Skin: Dermis and after achieving pain control using Other (Benzocaine). No specimens were taken. [name] time out was conducted at 10:44, prior to the start of the procedure. [name] Minimum amount of bleeding was controlled with Pressure. The procedure was tolerated well. Post Debridement  Measurements: 0.1cm length x 0.1cm width x 0.1cm depth; 0.001cm^3 volume. Character of Wound/Ulcer Post Debridement is stable. Severity of Tissue Post Debridement is: Fat layer exposed. Post procedure Diagnosis Wound #1: Same as Pre-Procedure Wound #3 Pre-procedure diagnosis of Wound #3 is [name] Diabetic Wound/Ulcer of the Lower Extremity located on the Right,Lateral Foot .Severity of Tissue Pre Debridement is: Fat layer exposed. There was [name] Excisional Skin/Subcutaneous Tissue Debridement with [name] total area of 0.2 sq cm performed by Kalman Shan, DO. With the following instrument(s): Curette, Forceps, and Scissors to remove Non-Viable tissue/material. Material removed includes Subcutaneous Tissue and Slough and after achieving pain control using Other (Benzocaine). No specimens were taken. [name] time out was conducted at 10:44, prior to the start of the procedure. [name] Minimum amount of bleeding was controlled with Pressure. The procedure was tolerated well. Post Debridement Measurements: 0.4cm length x 0.5cm width x 0.2cm depth; 0.031cm^3 volume. Character of Wound/Ulcer Post Debridement is stable. Severity of Tissue Post Debridement is: Fat layer exposed. Post procedure Diagnosis Wound #3: Same as Pre-Procedure Plan Follow-up Appointments: Return Appointment in 1 week. - with Dr. Heber Worth Nurse Visit: - Thursday 01/21/21 Other: - Prescription for antibiotic sent to pharmacy. Pick up prescription and take as directed. Bathing/ Shower/ Hygiene: May shower with protection but do not get wound dressing(s) wet. Edema Control - Lymphedema / SCD / Other: Elevate legs to the level of the heart or above for 30 minutes daily and/or when sitting, [name] frequency of: - throughout the day Avoid standing for long periods of time. Off-Loading: Open toe surgical shoe to: - to right foot The following medication(s) was prescribed: doxycycline hyclate oral 100 mg tablet 1 1 tablet oral BID x 10 days starting  01/18/2021 Augmentin oral 875 mg-125 mg tablet 1 1 tablet oral BID x 10 days starting 01/18/2021 WOUND #1: - Metatarsal head first Wound Laterality: Right Cleanser: Soap and Water 1 x Per Day/15 Days Discharge Instructions: May shower and wash wound with dial antibacterial soap and water prior to dressing change. Cleanser: Wound Cleanser 1 x Per Day/15 Days Discharge Instructions: Cleanse the wound with wound cleanser prior to applying [name] clean dressing using gauze sponges, not tissue or cotton balls. Prim Dressing: KerraCel Ag Gelling Fiber Dressing, 4x5 in (silver alginate) 1 x Per Day/15 Days ary Discharge Instructions: Apply silver alginate to wound bed as instructed Secondary Dressing: Woven Gauze Sponges 2x2 in 1 x Per Day/15 Days Discharge Instructions: Apply over primary dressing as directed. Secured With: [employer], 4.5x3.1 (in/yd) 1 x Per Day/15 Days Discharge Instructions: Secure with Kerlix as directed. Secured With: 48M Medipore H Soft Cloth Surgical T ape, 4 x 10 (in/yd) 1 x Per Day/15 Days Discharge Instructions: Secure with tape as directed. WOUND #3: - Foot Wound Laterality: Right, Lateral Cleanser: Soap and Water 1 x Per Week/ Discharge Instructions: May shower and wash  wound with dial antibacterial soap and water prior to dressing change. Cleanser: Wound Cleanser 1 x Per Week/ Discharge Instructions: Cleanse the wound with wound cleanser prior to applying [name] clean dressing using gauze sponges, not tissue or cotton balls. Topical: Gentamicin 1 x Per Week/ Discharge Instructions: As directed by physician Prim Dressing: KerraCel Ag Gelling Fiber Dressing, 2x2 in (silver alginate) 1 x Per Week/ ary Discharge Instructions: Apply silver alginate to wound bed as instructed Secondary Dressing: Woven Gauze Sponges 2x2 in 1 x Per Week/ Discharge Instructions: Apply over primary dressing as directed. Secured With: [employer], 4.5x3.1 (in/yd) 1 x Per  Week/ Discharge Instructions: Secure with Kerlix as directed. Secured With: 54M Medipore H Soft Cloth Surgical T ape, 4 x 10 (in/yd) 1 x Per Week/ Discharge Instructions: Secure with tape as directed. 1. In office sharp debridement 2. Silver alginate 3. Aggressive offloading 4. Follow-up later in the week and again next week 5. Augmentin and doxycycline Electronic Signature(s) Signed: 01/18/2021 12:04:18 PM By: Kalman Shan DO Entered By: Kalman Shan on 01/18/2021 12:02:28 -------------------------------------------------------------------------------- HxROS Details Patient Name: Date of Service: [name], [name] 01/18/2021 10:00 [name] M Medical Record Number: [id number] Patient Account Number: [id number] Date of Birth/Sex: Treating RN: 04/07/1955 (65 y.o. [name] Primary Care Provider: [name] Other Clinician: Referring Provider: Treating Provider/Extender: [name] in Treatment: 4 Information Obtained From Patient Chart Eyes Medical History: Negative for: Cataracts; Glaucoma; Optic Neuritis Ear/Nose/Mouth/Throat Medical History: Negative for: Chronic sinus problems/congestion; Middle ear problems Hematologic/Lymphatic Medical History: Negative for: Anemia; Hemophilia; Human Immunodeficiency Virus; Lymphedema; Sickle Cell Disease Respiratory Medical History: Positive for: Sleep Apnea Negative for: Aspiration; Asthma; Chronic Obstructive Pulmonary Disease (COPD); Pneumothorax; Tuberculosis Cardiovascular Medical History: Positive for: Coronary Artery Disease; Hypertension Negative for: Angina; Arrhythmia; Congestive Heart Failure; Deep Vein Thrombosis; Hypotension; Myocardial Infarction; Peripheral Arterial Disease; Peripheral Venous Disease; Phlebitis; Vasculitis Past Medical History Notes: hypercholesterolemia Gastrointestinal Medical History: Negative for: Cirrhosis ; Colitis; Crohns; Hepatitis [name];  Hepatitis B; Hepatitis C Endocrine Medical History: Positive for: Type II Diabetes Negative for: Type I Diabetes Genitourinary Medical History: Negative for: End Stage Renal Disease Immunological Medical History: Negative for: Lupus Erythematosus; Raynauds; Scleroderma Integumentary (Skin) Medical History: Negative for: History of Burn Musculoskeletal Medical History: Negative for: Gout; Rheumatoid Arthritis; Osteoarthritis; Osteomyelitis Neurologic Medical History: Positive for: Neuropathy Negative for: Dementia; Quadriplegia; Paraplegia; Seizure Disorder Immunizations Pneumococcal Vaccine: Received Pneumococcal Vaccination: Yes Received Pneumococcal Vaccination On or After 60th Birthday: Yes Tetanus Vaccine: Last tetanus shot: 12/17/2020 Implantable Devices No devices added Hospitalization / Surgery History Type of Hospitalization/Surgery 2nd and 3rd right toe amps total hysterectomy cholecystectomy 2 knee surgery's on left and 1 on right Family and Social History Unknown History: Yes; Current every day smoker - 1 pack/[name]/day; Alcohol Use: Never; Drug Use: No History; Caffeine Use: Moderate; Financial Concerns: No; Food, Clothing or Shelter Needs: No; Support System Lacking: No; Transportation Concerns: No Electronic Signature(s) Signed: 01/18/2021 12:04:18 PM By: Kalman Shan DO Signed: 01/18/2021 6:07:33 PM By: Lorrin Jackson Entered By: Kalman Shan on 01/18/2021 11:54:01 -------------------------------------------------------------------------------- SuperBill Details Patient Name: Date of Service: [name], [name] 01/18/2021 Medical Record Number: [id number] Patient Account Number: [id number] Date of Birth/Sex: Treating RN: 03/13/1955 (65 y.o. 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 E11.42 Type 2 diabetes mellitus with diabetic polyneuropathy F17.290 Nicotine dependence, other tobacco product, uncomplicated Q27.156 Non-pressure chronic ulcer of other part of right foot limited to breakdown of skin L03.115 Cellulitis of right lower limb Facility Procedures CPT4 Code: [id number] Description: 01992 - DEB SUBQ TISSUE 20 SQ CM/< ICD-10 Diagnosis Description L97.512 Non-pressure 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 Modifier: Quantity: 1 CPT4 Code: [id number] Description: 43246 - DEBRIDE WOUND 1ST 20 SQ CM OR < ICD-10 Diagnosis Description L97.512 Non-pressure 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 Modifier: Quantity: 1 Physician Procedures : CPT4 Code Description Modifier [id number] 89100 - WC PHYS LEVEL 4 - EST PT ICD-10 Diagnosis Description L03.115 Cellulitis of right lower limb L97.512 Non-pressure 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 E11.621 Type 2 diabetes  mellitus with foot ulcer E11.42 Type 2 diabetes mellitus with diabetic polyneuropathy Quantity: 1 : [id number] 11042 - WC PHYS SUBQ TISS 20 SQ CM 1 ICD-10 Diagnosis Description L97.512 Non-pressure 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 Quantity: : [id number] 37190 - WC PHYS DEBR WO ANESTH 20 SQ CM 1 ICD-10 Diagnosis Description L97.512 Non-pressure 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 Quantity: Electronic Signature(s) Signed: 01/18/2021 12:04:18 PM By: Kalman Shan DO Entered By: Kalman Shan on 01/18/2021 12:03:31

## 2021-01-19 DIAGNOSIS — E1129 Type 2 diabetes mellitus with other diabetic kidney complication: Secondary | ICD-10-CM | POA: Diagnosis not present

## 2021-01-19 DIAGNOSIS — Z794 Long term (current) use of insulin: Secondary | ICD-10-CM | POA: Diagnosis not present

## 2021-01-19 DIAGNOSIS — E039 Hypothyroidism, unspecified: Secondary | ICD-10-CM | POA: Diagnosis not present

## 2021-01-19 DIAGNOSIS — Z4681 Encounter for fitting and adjustment of insulin pump: Secondary | ICD-10-CM | POA: Diagnosis not present

## 2021-01-19 DIAGNOSIS — I10 Essential (primary) hypertension: Secondary | ICD-10-CM | POA: Diagnosis not present

## 2021-01-19 DIAGNOSIS — E785 Hyperlipidemia, unspecified: Secondary | ICD-10-CM | POA: Diagnosis not present

## 2021-01-19 NOTE — Progress Notes (Signed)
FOTINI, LEMUS ([id number]) Visit Report for 01/18/2021 Arrival Information Details Patient Name: Date of Service: [place], [name] 01/18/2021 10:00 A M Medical Record Number: [id number] Patient Account Number: [id number] Date of Birth/Sex: Treating RN: Jan 13, 1956 (65 y.o. [name] Primary Care Laria Grimmett: [name] Other Clinician: Referring Othelia Riederer: Treating Kristiane Morsch/Extender: [name] in Treatment: 4 Visit Information History Since Last Visit Added or deleted any medications: No Patient Arrived: Ambulatory Any new allergies or adverse reactions: No Arrival Time: 10:09 Had a fall or experienced change in No Accompanied By: self activities of daily living that may affect Transfer Assistance: None risk of falls: Patient Requires Transmission-Based Precautions: No Signs or symptoms of abuse/neglect since last visito No Patient Has Alerts: No Hospitalized since last visit: No Implantable device outside of the clinic excluding No cellular tissue based products placed in the center since last visit: Has Dressing in Place as Prescribed: Yes Pain Present Now: No Electronic Signature(s) Signed: 01/19/2021 5:02:58 PM By: Dellie Catholic RN Entered By: Dellie Catholic on 01/18/2021 10:09:48 -------------------------------------------------------------------------------- Encounter Discharge Information Details Patient Name: Date of Service: [name], [name] 01/18/2021 10:00 A M Medical Record Number: [id number] Patient Account Number: [id number] Date of Birth/Sex: Treating RN: [birth year] (65 y.o. [name] Primary Care Kyandre Okray: [name] Other Clinician: Referring Katera Rybka: Treating Antaeus Karel/Extender: [name] in Treatment: 4 Encounter Discharge Information Items Post Procedure Vitals Discharge Condition: Stable Temperature (F): 97.7 Ambulatory Status:  Ambulatory Pulse (bpm): 65 Discharge Destination: Home Respiratory Rate (breaths/min): 18 Transportation: Private Auto Blood Pressure (mmHg): 126/60 Schedule Follow-up Appointment: Yes Clinical Summary of Care: Provided on 01/18/2021 Form Type Recipient Paper Patient Patient Electronic Signature(s) Signed: 01/18/2021 6:07:33 PM By: Lorrin Jackson Entered By: Lorrin Jackson on 01/18/2021 11:20:44 -------------------------------------------------------------------------------- Lower Extremity Assessment Details Patient Name: Date of Service: [name], [name] 01/18/2021 10:00 A M Medical Record Number: [id number] Patient Account Number: [id number] Date of Birth/Sex: Treating RN: 27-Nov-1955 (65 y.o. [name] Primary Care Lovie Zarling: [name] Other Clinician: Referring Neshia Mckenzie: Treating Sanaai Doane/Extender: Glenna Fellows Weeks in Treatment: 4 Edema Assessment Assessed: [name]: No] [Right: No] Edema: [Left: Ye] [Right: s] Calf Left: Right: Point of Measurement: 32 cm From Medial Instep 44.5 cm Ankle Left: Right: Point of Measurement: 8 cm From Medial Instep 25 cm Electronic Signature(s) Signed: 01/18/2021 6:07:33 PM By: Lorrin Jackson Signed: 01/19/2021 5:02:58 PM By: Dellie Catholic RN Entered By: Dellie Catholic on 01/18/2021 10:10:44 -------------------------------------------------------------------------------- Multi Wound Chart Details Patient Name: Date of Service: [name], [name] 01/18/2021 10:00 A M Medical Record Number: [id number] Patient Account Number: [id number] Date of Birth/Sex: Treating RN: Nov 21, 1955 (65 y.o. [name] Primary Care Ammara Raj: [name] Other Clinician: Referring Magie Ciampa: Treating Samika Vetsch/Extender: [name] in Treatment: 4 Vital Signs Height(in): 42 Pulse(bpm): 69 Weight(lbs): 741 Blood Pressure(mmHg): 126/60 Body Mass  Index(BMI): 43 Temperature(F): 97.7 Respiratory Rate(breaths/min): 18 Photos: Right Metatarsal head first Right, Medial Malleolus Right, Lateral Foot Wound Location: Gradually Appeared Shear/Friction Shear/Friction Wounding Event: Diabetic Wound/Ulcer of the Lower Diabetic Wound/Ulcer of the Lower Diabetic Wound/Ulcer of the Lower Primary Etiology: Extremity Extremity Extremity Sleep Apnea, Coronary Artery Sleep Apnea, Coronary Artery Sleep Apnea, Coronary Artery Comorbid History: Disease, Hypertension, Type II Disease, Hypertension, Type II Disease, Hypertension, Type II Diabetes, Neuropathy Diabetes, Neuropathy Diabetes, Neuropathy 10/29/2020 01/11/2021 01/11/2021 Date Acquired: 4 1 1  Weeks of Treatment: Open Healed -  Epithelialized Open Wound Status: 0.1x0.1x0.1 0x0x0 0.4x0.5x0.2 Measurements L x W x D (cm) 0.008 0 0.157 A (cm) : rea 0.001 0 0.031 Volume (cm) : 98.10% 100.00% 0.00% % Reduction in A rea: 97.60% 100.00% -93.70% % Reduction in Volume: 12 Starting Position 1 (o'clock): 12 Ending Position 1 (o'clock): 0.3 Maximum Distance 1 (cm): No N/A Yes Undermining: Grade 1 Grade 1 Grade 1 Classification: Medium N/A Medium Exudate A mount: Serosanguineous N/A Serosanguineous Exudate Type: red, brown N/A red, brown Exudate Color: Distinct, outline attached N/A Flat and Intact Wound Margin: Large (67-100%) N/A Large (67-100%) Granulation A mount: Red, Pink N/A Red, Pink Granulation Quality: None Present (0%) N/A None Present (0%) Necrotic A mount: Fat Layer (Subcutaneous Tissue): Yes N/A Fat Layer (Subcutaneous Tissue): Yes Exposed Structures: Fascia: No Fascia: No Tendon: No Tendon: No Muscle: No Muscle: No Joint: No Joint: No Bone: No Bone: No None N/A None Epithelialization: Debridement - Selective/Open Wound N/A Debridement - Excisional Debridement: Pre-procedure Verification/Time Out 10:44 N/A 10:44 Taken: Other N/A Other Pain  Control: Callus N/A Subcutaneous, Slough Tissue Debrided: Skin/Dermis N/A Skin/Subcutaneous Tissue Level: 0.09 N/A 0.2 Debridement A (sq cm): rea Curette N/A Curette, Forceps, Scissors Instrument: Minimum N/A Minimum Bleeding: Pressure N/A Pressure Hemostasis A chieved: Procedure was tolerated well N/A Procedure was tolerated well Debridement Treatment Response: 0.1x0.1x0.1 N/A 0.4x0.5x0.2 Post Debridement Measurements L x W x D (cm) 0.001 N/A 0.031 Post Debridement Volume: (cm) Debridement N/A Debridement Procedures Performed: Treatment Notes Wound #1 (Metatarsal head first) Wound Laterality: Right Cleanser Soap and Water Discharge Instruction: May shower and wash wound with dial antibacterial soap and water prior to dressing change. Wound Cleanser Discharge Instruction: Cleanse the wound with wound cleanser prior to applying a clean dressing using gauze sponges, not tissue or cotton balls. Peri-Wound Care Topical Primary Dressing KerraCel Ag Gelling Fiber Dressing, 4x5 in (silver alginate) Discharge Instruction: Apply silver alginate to wound bed as instructed Secondary Dressing Woven Gauze Sponges 2x2 in Discharge Instruction: Apply over primary dressing as directed. Secured With [employer], 4.5x3.1 (in/yd) Discharge Instruction: Secure with Kerlix as directed. 15M Medipore H Soft Cloth Surgical T ape, 4 x 10 (in/yd) Discharge Instruction: Secure with tape as directed. Compression Wrap Compression Stockings Add-Ons Wound #3 (Foot) Wound Laterality: Right, Lateral Cleanser Soap and Water Discharge Instruction: May shower and wash wound with dial antibacterial soap and water prior to dressing change. Wound Cleanser Discharge Instruction: Cleanse the wound with wound cleanser prior to applying a clean dressing using gauze sponges, not tissue or cotton balls. Peri-Wound Care Topical Gentamicin Discharge Instruction: As directed by physician Primary  Dressing KerraCel Ag Gelling Fiber Dressing, 2x2 in (silver alginate) Discharge Instruction: Apply silver alginate to wound bed as instructed Secondary Dressing Woven Gauze Sponges 2x2 in Discharge Instruction: Apply over primary dressing as directed. Secured With [employer], 4.5x3.1 (in/yd) Discharge Instruction: Secure with Kerlix as directed. 15M Medipore H Soft Cloth Surgical T ape, 4 x 10 (in/yd) Discharge Instruction: Secure with tape as directed. Compression Wrap Compression Stockings Add-Ons Electronic Signature(s) Signed: 01/18/2021 12:04:18 PM By: Kalman Shan DO Signed: 01/18/2021 6:07:33 PM By: Lorrin Jackson Entered By: Kalman Shan on 01/18/2021 11:53:10 -------------------------------------------------------------------------------- [place] Details Patient Name: Date of Service: [name], [name] 01/18/2021 10:00 A M Medical Record Number: [id number] Patient Account Number: [id number] Date of Birth/Sex: Treating RN: [birth year] (65 y.o. [name] Primary Care Blase Beckner: [name] Other Clinician: Referring Wanda Rideout: Treating Denim Kalmbach/Extender: [name]  in Treatment: 4 Active Inactive Nutrition Nursing Diagnoses: Impaired glucose control: actual or potential Goals: Patient/caregiver verbalizes understanding of need to maintain therapeutic glucose control per primary care physician Date Initiated: 12/17/2020 Target Resolution Date: 01/21/2021 Goal Status: Active Interventions: Assess HgA1c results as ordered upon admission and as needed Assess patient nutrition upon admission and as needed per policy Provide education on elevated blood sugars and impact on wound healing Notes: Wound/Skin Impairment Nursing Diagnoses: Impaired tissue integrity Goals: Patient/caregiver will verbalize understanding of skin care regimen Date Initiated: 12/17/2020 Target Resolution  Date: 01/21/2021 Goal Status: Active Ulcer/skin breakdown will have a volume reduction of 30% by week 4 Date Initiated: 12/17/2020 Target Resolution Date: 01/21/2021 Goal Status: Active Interventions: Assess patient/caregiver ability to obtain necessary supplies Assess patient/caregiver ability to perform ulcer/skin care regimen upon admission and as needed Assess ulceration(s) every visit Provide education on ulcer and skin care Treatment Activities: Topical wound management initiated : 12/17/2020 Notes: Electronic Signature(s) Signed: 01/18/2021 10:43:59 AM By: Lorrin Jackson Entered By: Lorrin Jackson on 01/18/2021 10:43:58 -------------------------------------------------------------------------------- Pain Assessment Details Patient Name: Date of Service: [name], [name] 01/18/2021 10:00 A M Medical Record Number: [id number] Patient Account Number: [id number] Date of Birth/Sex: Treating RN: Aug 23, 1955 (65 y.o. [name] Primary Care Lacresha Fusilier: [name] Other Clinician: Referring Neng Albee: Treating Cerissa Zeiger/Extender: [name] in Treatment: 4 Active Problems Location of Pain Severity and Description of Pain Patient Has Paino No Site Locations Pain Management and Medication Current Pain Management: Electronic Signature(s) Signed: 01/18/2021 6:07:33 PM By: Lorrin Jackson Signed: 01/19/2021 5:02:58 PM By: Dellie Catholic RN Entered By: Dellie Catholic on 01/18/2021 10:10:35 -------------------------------------------------------------------------------- Patient/Caregiver Education Details Patient Name: Date of Service: [name], [name] 11/28/2022andnbsp10:00 A M Medical Record Number: [id number] Patient Account Number: [id number] Date of Birth/Gender: Treating RN: 1955/06/02 (65 y.o. [name] Primary Care Physician: [name] Other Clinician: Referring Physician: Treating  Physician/Extender: [name] in Treatment: 4 Education Assessment Education Provided To: Patient Education Topics Provided Elevated Blood Sugar/ Impact on Healing: Methods: Explain/Verbal Responses: State content correctly Offloading: Methods: Explain/Verbal, Printed Responses: State content correctly Wound/Skin Impairment: Methods: Explain/Verbal, Printed Responses: State content correctly Electronic Signature(s) Signed: 01/18/2021 6:07:33 PM By: Lorrin Jackson Entered By: Lorrin Jackson on 01/18/2021 10:44:23 -------------------------------------------------------------------------------- Wound Assessment Details Patient Name: Date of Service: [name], [name] 01/18/2021 10:00 A M Medical Record Number: [id number] Patient Account Number: [id number] Date of Birth/Sex: Treating RN: 12/24/1955 (65 y.o. [name] Primary Care Alan Drummer: [name] Other Clinician: Referring Kaisyn Reinhold: Treating Stephanine Reas/Extender: [name] in Treatment: 4 Wound Status Wound Number: 1 Primary Diabetic Wound/Ulcer of the Lower Extremity Etiology: Wound Location: Right Metatarsal head first Wound Open Wounding Event: Gradually Appeared Status: Date Acquired: 10/29/2020 Comorbid Sleep Apnea, Coronary Artery Disease, Hypertension, Type II Weeks Of Treatment: 4 History: Diabetes, Neuropathy Clustered Wound: No Photos Wound Measurements Length: (cm) 0.1 Width: (cm) 0.1 Depth: (cm) 0.1 Area: (cm) 0.008 Volume: (cm) 0.001 % Reduction in Area: 98.1% % Reduction in Volume: 97.6% Epithelialization: None Tunneling: No Undermining: No Wound Description Classification: Grade 1 Wound Margin: Distinct, outline attached Exudate Amount: Medium Exudate Type: Serosanguineous Exudate Color: red, brown Foul Odor After Cleansing: No Slough/Fibrino No Wound Bed Granulation Amount: Large (67-100%) Exposed  Structure Granulation Quality: Red, Pink Fascia Exposed: No Necrotic Amount: None Present (0%) Fat Layer (Subcutaneous Tissue) Exposed: Yes Tendon Exposed: No Muscle Exposed: No Joint Exposed:  No Bone Exposed: No Treatment Notes Wound #1 (Metatarsal head first) Wound Laterality: Right Cleanser Soap and Water Discharge Instruction: May shower and wash wound with dial antibacterial soap and water prior to dressing change. Wound Cleanser Discharge Instruction: Cleanse the wound with wound cleanser prior to applying a clean dressing using gauze sponges, not tissue or cotton balls. Peri-Wound Care Topical Primary Dressing KerraCel Ag Gelling Fiber Dressing, 4x5 in (silver alginate) Discharge Instruction: Apply silver alginate to wound bed as instructed Secondary Dressing Woven Gauze Sponges 2x2 in Discharge Instruction: Apply over primary dressing as directed. Secured With [employer], 4.5x3.1 (in/yd) Discharge Instruction: Secure with Kerlix as directed. 83M Medipore H Soft Cloth Surgical T ape, 4 x 10 (in/yd) Discharge Instruction: Secure with tape as directed. Compression Wrap Compression Stockings Add-Ons Electronic Signature(s) Signed: 01/18/2021 6:07:33 PM By: Lorrin Jackson Signed: 01/19/2021 5:02:58 PM By: Dellie Catholic RN Entered By: Dellie Catholic on 01/18/2021 10:35:31 -------------------------------------------------------------------------------- Wound Assessment Details Patient Name: Date of Service: [name], [name] 01/18/2021 10:00 A M Medical Record Number: [id number] Patient Account Number: [id number] Date of Birth/Sex: Treating RN: [birth year] (65 y.o. [name] Primary Care Ellia Knowlton: [name] Other Clinician: Referring Rigby Swamy: Treating Zacharias Ridling/Extender: [name] in Treatment: 4 Wound Status Wound Number: 2 Primary Diabetic Wound/Ulcer of the Lower Extremity Etiology: Wound  Location: Right, Medial Malleolus Wound Healed - Epithelialized Wounding Event: Shear/Friction Status: Date Acquired: 01/11/2021 Comorbid Sleep Apnea, Coronary Artery Disease, Hypertension, Type II Weeks Of Treatment: 1 History: Diabetes, Neuropathy Clustered Wound: No Photos Wound Measurements Length: (cm) Width: (cm) Depth: (cm) Area: (cm) Volume: (cm) 0 % Reduction in Area: 100% 0 % Reduction in Volume: 100% 0 0 0 Wound Description Classification: Grade 1 Electronic Signature(s) Signed: 01/18/2021 6:07:33 PM By: Lorrin Jackson Entered By: Lorrin Jackson on 01/18/2021 10:52:13 -------------------------------------------------------------------------------- Wound Assessment Details Patient Name: Date of Service: [name], [name] 01/18/2021 10:00 A M Medical Record Number: [id number] Patient Account Number: [id number] Date of Birth/Sex: Treating RN: 1956-02-02 (65 y.o. [name] Primary Care Arnell Mausolf: [name] Other Clinician: Referring Sylvan Lahm: Treating Cera Rorke/Extender: [name] in Treatment: 4 Wound Status Wound Number: 3 Primary Diabetic Wound/Ulcer of the Lower Extremity Etiology: Wound Location: Right, Lateral Foot Wound Open Wounding Event: Shear/Friction Status: Date Acquired: 01/11/2021 Comorbid Sleep Apnea, Coronary Artery Disease, Hypertension, Type II Weeks Of Treatment: 1 History: Diabetes, Neuropathy Clustered Wound: No Photos Wound Measurements Length: (cm) 0.4 Width: (cm) 0.5 Depth: (cm) 0.2 Area: (cm) 0.157 Volume: (cm) 0.031 % Reduction in Area: 0% % Reduction in Volume: -93.7% Epithelialization: None Tunneling: No Undermining: Yes Starting Position (o'clock): 12 Ending Position (o'clock): 12 Maximum Distance: (cm) 0.3 Wound Description Classification: Grade 1 Wound Margin: Flat and Intact Exudate Amount: Medium Exudate Type: Serosanguineous Exudate Color: red,  brown Foul Odor After Cleansing: No Slough/Fibrino No Wound Bed Granulation Amount: Large (67-100%) Exposed Structure Granulation Quality: Red, Pink Fascia Exposed: No Necrotic Amount: None Present (0%) Fat Layer (Subcutaneous Tissue) Exposed: Yes Tendon Exposed: No Muscle Exposed: No Joint Exposed: No Bone Exposed: No Treatment Notes Wound #3 (Foot) Wound Laterality: Right, Lateral Cleanser Soap and Water Discharge Instruction: May shower and wash wound with dial antibacterial soap and water prior to dressing change. Wound Cleanser Discharge Instruction: Cleanse the wound with wound cleanser prior to applying a clean dressing using gauze sponges, not tissue or cotton balls. Peri-Wound Care Topical Gentamicin Discharge Instruction: As directed by physician Primary Dressing  KerraCel Ag Gelling Fiber Dressing, 2x2 in (silver alginate) Discharge Instruction: Apply silver alginate to wound bed as instructed Secondary Dressing Woven Gauze Sponges 2x2 in Discharge Instruction: Apply over primary dressing as directed. Secured With [employer], 4.5x3.1 (in/yd) Discharge Instruction: Secure with Kerlix as directed. 21M Medipore H Soft Cloth Surgical T ape, 4 x 10 (in/yd) Discharge Instruction: Secure with tape as directed. Compression Wrap Compression Stockings Add-Ons Electronic Signature(s) Signed: 01/18/2021 6:07:33 PM By: Lorrin Jackson Signed: 01/19/2021 5:02:58 PM By: Dellie Catholic RN Entered By: Dellie Catholic on 01/18/2021 10:37:36 -------------------------------------------------------------------------------- Vitals Details Patient Name: Date of Service: [name], [name] 01/18/2021 10:00 A M Medical Record Number: [id number] Patient Account Number: [id number] Date of Birth/Sex: Treating RN: 1955-09-30 (65 y.o. [name] Primary Care Rupert Azzara: [name] Other Clinician: Referring Zanovia Rotz: Treating Veron Senner/Extender: [name] in Treatment: 4 Vital Signs Time Taken: 10:10 Temperature (F): 97.7 Height (in): 67 Pulse (bpm): 65 Weight (lbs): 277 Respiratory Rate (breaths/min): 18 Body Mass Index (BMI): 43.4 Blood Pressure (mmHg): 126/60 Reference Range: 80 - 120 mg / dl Electronic Signature(s) Signed: 01/19/2021 5:02:58 PM By: Dellie Catholic RN Entered By: Dellie Catholic on 01/18/2021 10:10:26

## 2021-01-21 ENCOUNTER — Encounter (HOSPITAL_BASED_OUTPATIENT_CLINIC_OR_DEPARTMENT_OTHER): Payer: Medicare Other | Attending: Internal Medicine | Admitting: Internal Medicine

## 2021-01-21 ENCOUNTER — Other Ambulatory Visit: Payer: Self-pay

## 2021-01-21 DIAGNOSIS — E1142 Type 2 diabetes mellitus with diabetic polyneuropathy: Secondary | ICD-10-CM | POA: Diagnosis not present

## 2021-01-21 DIAGNOSIS — E039 Hypothyroidism, unspecified: Secondary | ICD-10-CM | POA: Insufficient documentation

## 2021-01-21 DIAGNOSIS — L97512 Non-pressure 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: Secondary | ICD-10-CM | POA: Diagnosis not present

## 2021-01-21 DIAGNOSIS — E11621 Type 2 diabetes mellitus with foot ulcer: Secondary | ICD-10-CM | POA: Insufficient documentation

## 2021-01-21 NOTE — Progress Notes (Signed)
LARUE, [name] ([id number]) Visit Report for 01/21/2021 [place] Details Patient Name: Date of Service: [name], [name] 01/21/2021 Medical Record Number: [id number] Patient Account Number: [id number] Date of Birth/Sex: Treating RN: 1955-12-18 (65 y.o. F) Primary Care Provider: [name] Other Clinician: Referring Provider: Treating Provider/Extender: [name] in Treatment: 5 Diagnosis Coding ICD-10 Codes Code Description E11.621 Type 2 diabetes mellitus with foot ulcer L97.512 Non-pressure 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 E11.42 Type 2 diabetes mellitus with diabetic polyneuropathy F17.290 Nicotine dependence, other tobacco product, uncomplicated U72.761 Non-pressure chronic ulcer of other part of right foot limited to breakdown of skin L03.115 Cellulitis of right lower limb Facility Procedures CPT4 Code Description Modifier Quantity [id number] - WOUND CARE VISIT-LEV 3 EST PT 1 Electronic Signature(s) Signed: 01/21/2021 10:10:26 AM By: Kalman Shan DO Signed: 01/21/2021 5:42:59 PM By: Dellie Catholic RN Entered By: Dellie Catholic on 01/21/2021 09:42:56

## 2021-01-21 NOTE — Progress Notes (Signed)
[name], PEFFLEY ([id number]) Visit Report for 01/21/2021 Arrival Information Details Patient Name: Date of Service: [name], [name] [name] K. 01/21/2021 8:45 [name] M Medical Record Number: [id number] Patient Account Number: [id number] Date of Birth/Sex: Treating RN: 11-29-1955 (65 y.o. F) Primary Care Terrel Nesheiwat: [name] Other Clinician: Referring Avarose Mervine: Treating Tamerra Merkley/Extender: [name] in Treatment: 5 Visit Information History Since Last Visit Added or deleted any medications: No Patient Arrived: Ambulatory Any new allergies or adverse reactions: No Arrival Time: 08:39 Had [name] fall or experienced change in No Accompanied By: sister-in-law activities of daily living that may affect Transfer Assistance: None risk of falls: Patient Identification Verified: Yes Signs or symptoms of abuse/neglect since last visito No Patient Requires Transmission-Based Precautions: No Hospitalized since last visit: No Patient Has Alerts: No Implantable device outside of the clinic excluding No cellular tissue based products placed in the center since last visit: Pain Present Now: No Electronic Signature(s) Signed: 01/21/2021 5:42:59 PM By: Dellie Catholic RN Entered By: Dellie Catholic on 01/21/2021 08:40:57 -------------------------------------------------------------------------------- Clinic Level of Care Assessment Details Patient Name: Date of Service: [name], [name] [name] K. 01/21/2021 8:45 [name] M Medical Record Number: [id number] Patient Account Number: [id number] Date of Birth/Sex: Treating RN: [birth year] (65 y.o. F) Primary Care Emaan Gary: [name] Other Clinician: Referring Brentin Shin: Treating Latosha Gaylord/Extender: [name] in Treatment: 5 Clinic Level of Care Assessment Items TOOL 4 Quantity Score X- 1 0 Use when only an EandM is performed on FOLLOW-UP visit ASSESSMENTS - Nursing Assessment /  Reassessment X- 1 10 Reassessment of Co-morbidities (includes updates in patient status) X- 1 5 Reassessment of Adherence to Treatment Plan ASSESSMENTS - Wound and Skin [name] ssessment / Reassessment []  - 0 Simple Wound Assessment / Reassessment - one wound X- 2 5 Complex Wound Assessment / Reassessment - multiple wounds []  - 0 Dermatologic / Skin Assessment (not related to wound area) ASSESSMENTS - Focused Assessment []  - 0 Circumferential Edema Measurements - multi extremities []  - 0 Nutritional Assessment / Counseling / Intervention []  - 0 Lower Extremity Assessment (monofilament, tuning fork, pulses) []  - 0 Peripheral Arterial Disease Assessment (using hand held doppler) ASSESSMENTS - Ostomy and/or Continence Assessment and Care []  - 0 Incontinence Assessment and Management []  - 0 Ostomy Care Assessment and Management (repouching, etc.) PROCESS - Coordination of Care X - Simple Patient / Family Education for ongoing care 1 15 []  - 0 Complex (extensive) Patient / Family Education for ongoing care X- 1 10 Staff obtains Programmer, systems, Records, T Results / Process Orders est []  - 0 Staff telephones HHA, Nursing Homes / Clarify orders / etc []  - 0 Routine Transfer to another Facility (non-emergent condition) []  - 0 Routine Hospital Admission (non-emergent condition) []  - 0 New Admissions / Biomedical engineer / Ordering NPWT Apligraf, etc. , []  - 0 Emergency Hospital Admission (emergent condition) X- 1 10 Simple Discharge Coordination []  - 0 Complex (extensive) Discharge Coordination PROCESS - Special Needs []  - 0 Pediatric / Minor Patient Management []  - 0 Isolation Patient Management []  - 0 Hearing / Language / Visual special needs []  - 0 Assessment of Community assistance (transportation, D/C planning, etc.) []  - 0 Additional assistance / Altered mentation []  - 0 Support Surface(s) Assessment (bed, cushion, seat, etc.) INTERVENTIONS - Wound Cleansing /  Measurement []  - 0 Simple Wound Cleansing - one wound X- 2 5 Complex Wound Cleansing - multiple wounds []  - 0 Wound Imaging (photographs - any number  of wounds) []  - 0 Wound Tracing (instead of photographs) []  - 0 Simple Wound Measurement - one wound []  - 0 Complex Wound Measurement - multiple wounds INTERVENTIONS - Wound Dressings []  - 0 Small Wound Dressing one or multiple wounds X- 1 15 Medium Wound Dressing one or multiple wounds []  - 0 Large Wound Dressing one or multiple wounds []  - 0 Application of Medications - topical []  - 0 Application of Medications - injection INTERVENTIONS - Miscellaneous []  - 0 External ear exam []  - 0 Specimen Collection (cultures, biopsies, blood, body fluids, etc.) []  - 0 Specimen(s) / Culture(s) sent or taken to Lab for analysis []  - 0 Patient Transfer (multiple staff / Civil Service fast streamer / Similar devices) []  - 0 Simple Staple / Suture removal (25 or less) []  - 0 Complex Staple / Suture removal (26 or more) []  - 0 Hypo / Hyperglycemic Management (close monitor of Blood Glucose) []  - 0 Ankle / Brachial Index (ABI) - do not check if billed separately X- 1 5 Vital Signs Has the patient been seen at the hospital within the last three years: Yes Total Score: 90 Level Of Care: New/Established - Level 3 Electronic Signature(s) Signed: 01/21/2021 5:42:59 PM By: Dellie Catholic RN Entered By: Dellie Catholic on 01/21/2021 09:40:08 -------------------------------------------------------------------------------- Encounter Discharge Information Details Patient Name: Date of Service: [name], [name] [name] K. 01/21/2021 8:45 [name] M Medical Record Number: [id number] Patient Account Number: [id number] Date of Birth/Sex: Treating RN: 1955-10-20 (65 y.o. F) Primary Care Abrie Egloff: [name] Other Clinician: Referring Mana Morison: Treating Diogenes Whirley/Extender: [name] in Treatment: 5 Encounter Discharge Information  Items Discharge Condition: Stable Ambulatory Status: Ambulatory Discharge Destination: Home Transportation: Private Auto Accompanied By: sister-in-law Schedule Follow-up Appointment: Yes Clinical Summary of Care: Patient Declined Electronic Signature(s) Signed: 01/21/2021 5:42:59 PM By: Dellie Catholic RN Entered By: Dellie Catholic on 01/21/2021 09:42:12 -------------------------------------------------------------------------------- Wound Assessment Details Patient Name: Date of Service: [name], [name] [name] K. 01/21/2021 8:45 [name] M Medical Record Number: [id number] Patient Account Number: [id number] Date of Birth/Sex: Treating RN: 03/17/1955 (65 y.o. F) Primary Care Brennen Gardiner: [name] Other Clinician: Referring Eames Dibiasio: Treating Jacobb Alen/Extender: [name] in Treatment: 5 Wound Status Wound Number: 1 Primary Diabetic Wound/Ulcer of the Lower Extremity Etiology: Wound Location: Right Metatarsal head first Wound Open Wounding Event: Gradually Appeared Status: Date Acquired: 10/29/2020 Comorbid Sleep Apnea, Coronary Artery Disease, Hypertension, Type II Weeks Of Treatment: 5 History: Diabetes, Neuropathy Clustered Wound: No Wound Measurements Length: (cm) 0.1 Width: (cm) 0.1 Depth: (cm) 0.1 Area: (cm) 0.008 Volume: (cm) 0.001 % Reduction in Area: 98.1% % Reduction in Volume: 97.6% Epithelialization: None Wound Description Classification: Grade 1 Wound Margin: Distinct, outline attached Exudate Amount: Medium Exudate Type: Serosanguineous Exudate Color: red, brown Foul Odor After Cleansing: No Slough/Fibrino No Wound Bed Granulation Amount: Large (67-100%) Exposed Structure Granulation Quality: Red, Pink Fascia Exposed: No Necrotic Amount: None Present (0%) Fat Layer (Subcutaneous Tissue) Exposed: Yes Tendon Exposed: No Muscle Exposed: No Joint Exposed: No Bone Exposed: No Treatment Notes Wound #1 (Metatarsal head  first) Wound Laterality: Right Cleanser Soap and Water Discharge Instruction: May shower and wash wound with dial antibacterial soap and water prior to dressing change. Wound Cleanser Discharge Instruction: Cleanse the wound with wound cleanser prior to applying [name] clean dressing using gauze sponges, not tissue or cotton balls. Peri-Wound Care Topical Primary Dressing KerraCel Ag Gelling Fiber Dressing, 4x5 in (silver alginate) Discharge Instruction: Apply silver alginate to wound  bed as instructed Secondary Dressing Woven Gauze Sponges 2x2 in Discharge Instruction: Apply over primary dressing as directed. Secured With [employer], 4.5x3.1 (in/yd) Discharge Instruction: Secure with Kerlix as directed. 469M Medipore H Soft Cloth Surgical T ape, 4 x 10 (in/yd) Discharge Instruction: Secure with tape as directed. Compression Wrap Compression Stockings Add-Ons Electronic Signature(s) Signed: 01/21/2021 5:42:59 PM By: Dellie Catholic RN Entered By: Dellie Catholic on 01/21/2021 09:34:53 -------------------------------------------------------------------------------- Wound Assessment Details Patient Name: Date of Service: [name], [name] [name] K. 01/21/2021 8:45 [name] M Medical Record Number: [id number] Patient Account Number: [id number] Date of Birth/Sex: Treating RN: Apr 29, 1955 (65 y.o. F) Primary Care Nyah Shepherd: [name] Other Clinician: Referring Ashon Rosenberg: Treating Savio Albrecht/Extender: [name] in Treatment: 5 Wound Status Wound Number: 3 Primary Diabetic Wound/Ulcer of the Lower Extremity Etiology: Wound Location: Right, Lateral Foot Wound Open Wounding Event: Shear/Friction Status: Status: Date Acquired: 01/11/2021 Comorbid Sleep Apnea, Coronary Artery Disease, Hypertension, Type II Weeks Of Treatment: 1 History: Diabetes, Neuropathy Clustered Wound: No Wound Measurements Length: (cm) 0.4 Width: (cm) 0.5 Depth: (cm)  0.2 Area: (cm) 0.157 Volume: (cm) 0.031 % Reduction in Area: 0% % Reduction in Volume: -93.7% Epithelialization: None Wound Description Classification: Grade 1 Wound Margin: Flat and Intact Exudate Amount: Medium Exudate Type: Serosanguineous Exudate Color: red, brown Foul Odor After Cleansing: No Slough/Fibrino No Wound Bed Granulation Amount: Large (67-100%) Exposed Structure Granulation Quality: Red, Pink Fascia Exposed: No Necrotic Amount: None Present (0%) Fat Layer (Subcutaneous Tissue) Exposed: Yes Tendon Exposed: No Muscle Exposed: No Joint Exposed: No Bone Exposed: No Treatment Notes Wound #3 (Foot) Wound Laterality: Right, Lateral Cleanser Soap and Water Discharge Instruction: May shower and wash wound with dial antibacterial soap and water prior to dressing change. Wound Cleanser Discharge Instruction: Cleanse the wound with wound cleanser prior to applying [name] clean dressing using gauze sponges, not tissue or cotton balls. Peri-Wound Care Topical Gentamicin Discharge Instruction: As directed by physician Primary Dressing KerraCel Ag Gelling Fiber Dressing, 2x2 in (silver alginate) Discharge Instruction: Apply silver alginate to wound bed as instructed Secondary Dressing Woven Gauze Sponges 2x2 in Discharge Instruction: Apply over primary dressing as directed. Secured With [employer], 4.5x3.1 (in/yd) Discharge Instruction: Secure with Kerlix as directed. 469M Medipore H Soft Cloth Surgical T ape, 4 x 10 (in/yd) Discharge Instruction: Secure with tape as directed. Compression Wrap Compression Stockings Add-Ons Electronic Signature(s) Signed: 01/21/2021 5:42:59 PM By: Dellie Catholic RN Entered By: Dellie Catholic on 01/21/2021 09:35:05 -------------------------------------------------------------------------------- Vitals Details Patient Name: Date of Service: [name], [name] [name] K. 01/21/2021 8:45 [name] M Medical Record Number: [id number] Patient  Account Number: [id number] Date of Birth/Sex: Treating RN: [birth year] (65 y.o. F) Primary Care Tomi Grandpre: [name] Other Clinician: Referring Brantly Kalman: Treating Kejon Feild/Extender: [name] in Treatment: 5 Vital Signs Time Taken: 08:40 Temperature (F): 97.9 Height (in): 67 Pulse (bpm): 75 Weight (lbs): 277 Respiratory Rate (breaths/min): 18 Body Mass Index (BMI): 43.4 Blood Pressure (mmHg): 145/90 Reference Range: 80 - 120 mg / dl Electronic Signature(s) Signed: 01/21/2021 5:42:59 PM By: Dellie Catholic RN Entered By: Dellie Catholic on 01/21/2021 08:42:16

## 2021-01-25 ENCOUNTER — Encounter (HOSPITAL_BASED_OUTPATIENT_CLINIC_OR_DEPARTMENT_OTHER): Payer: Medicare Other | Admitting: Internal Medicine

## 2021-01-25 ENCOUNTER — Other Ambulatory Visit: Payer: Self-pay

## 2021-01-25 DIAGNOSIS — L97512 Non-pressure 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: Secondary | ICD-10-CM | POA: Diagnosis not present

## 2021-01-25 DIAGNOSIS — E1142 Type 2 diabetes mellitus with diabetic polyneuropathy: Secondary | ICD-10-CM | POA: Diagnosis not present

## 2021-01-25 DIAGNOSIS — E039 Hypothyroidism, unspecified: Secondary | ICD-10-CM | POA: Diagnosis not present

## 2021-01-25 DIAGNOSIS — E11621 Type 2 diabetes mellitus with foot ulcer: Secondary | ICD-10-CM | POA: Diagnosis not present

## 2021-01-26 NOTE — Progress Notes (Signed)
DEETTE, REVAK ([id number]) Visit Report for 01/25/2021 Arrival Information Details Patient Name: Date of Service: [name], [name] 01/25/2021 9:00 A M Medical Record Number: [id number] Patient Account Number: [id number] Date of Birth/Sex: Treating RN: [birth year] (65 y.o. [name] Primary Care Kelliann Pendergraph: [name] Other Clinician: Referring Asuzena Weis: Treating Unice Vantassel/Extender: [name] in Treatment: 5 Visit Information History Since Last Visit Added or deleted any medications: No Patient Arrived: Ambulatory Any new allergies or adverse reactions: No Arrival Time: 09:04 Had a fall or experienced change in No Transfer Assistance: None activities of daily living that may affect Patient Identification Verified: Yes risk of falls: Secondary Verification Process Completed: Yes Signs or symptoms of abuse/neglect since No Patient Requires Transmission-Based Precautions: No last visito Patient Has Alerts: No Hospitalized since last visit: No Implantable device outside of the clinic No excluding cellular tissue based products placed in the center since last visit: Has Dressing in Place as Prescribed: Yes Has Footwear/Offloading in Place as Yes Prescribed: Right: Surgical Shoe with Pressure Relief Insole Pain Present Now: No Electronic Signature(s) Signed: 01/26/2021 3:40:48 PM By: Lorrin Jackson Entered By: Lorrin Jackson on 01/25/2021 09:21:26 -------------------------------------------------------------------------------- Clinic Level of Care Assessment Details Patient Name: Date of Service: [name], [name] 01/25/2021 9:00 A M Medical Record Number: [id number] Patient Account Number: [id number] Date of Birth/Sex: Treating RN: 31-May-1955 (65 y.o. [name] Primary Care Kaylean Tupou: [name] Other Clinician: Referring Merion Grimaldo: Treating Mayjor Ager/Extender: [name]  in Treatment: 5 Clinic Level of Care Assessment Items TOOL 4 Quantity Score X- 1 0 Use when only an EandM is performed on FOLLOW-UP visit ASSESSMENTS - Nursing Assessment / Reassessment []  - 0 Reassessment of Co-morbidities (includes updates in patient status) []  - 0 Reassessment of Adherence to Treatment Plan ASSESSMENTS - Wound and Skin A ssessment / Reassessment []  - 0 Simple Wound Assessment / Reassessment - one wound X- 2 5 Complex Wound Assessment / Reassessment - multiple wounds []  - 0 Dermatologic / Skin Assessment (not related to wound area) ASSESSMENTS - Focused Assessment []  - 0 Circumferential Edema Measurements - multi extremities []  - 0 Nutritional Assessment / Counseling / Intervention []  - 0 Lower Extremity Assessment (monofilament, tuning fork, pulses) []  - 0 Peripheral Arterial Disease Assessment (using hand held doppler) ASSESSMENTS - Ostomy and/or Continence Assessment and Care []  - 0 Incontinence Assessment and Management []  - 0 Ostomy Care Assessment and Management (repouching, etc.) PROCESS - Coordination of Care []  - 0 Simple Patient / Family Education for ongoing care X- 1 20 Complex (extensive) Patient / Family Education for ongoing care []  - 0 Staff obtains Programmer, systems, Records, T Results / Process Orders est []  - 0 Staff telephones HHA, Nursing Homes / Clarify orders / etc []  - 0 Routine Transfer to another Facility (non-emergent condition) []  - 0 Routine Hospital Admission (non-emergent condition) []  - 0 New Admissions / Biomedical engineer / Ordering NPWT Apligraf, etc. , []  - 0 Emergency Hospital Admission (emergent condition) []  - 0 Simple Discharge Coordination []  - 0 Complex (extensive) Discharge Coordination PROCESS - Special Needs []  - 0 Pediatric / Minor Patient Management []  - 0 Isolation Patient Management []  - 0 Hearing / Language / Visual special needs []  - 0 Assessment of Community assistance (transportation,  D/C planning, etc.) []  - 0 Additional assistance / Altered mentation []  - 0 Support Surface(s) Assessment (bed, cushion, seat, etc.) INTERVENTIONS - Wound Cleansing / Measurement []  -  0 Simple Wound Cleansing - one wound X- 2 5 Complex Wound Cleansing - multiple wounds X- 1 5 Wound Imaging (photographs - any number of wounds) []  - 0 Wound Tracing (instead of photographs) []  - 0 Simple Wound Measurement - one wound X- 2 5 Complex Wound Measurement - multiple wounds INTERVENTIONS - Wound Dressings X - Small Wound Dressing one or multiple wounds 2 10 []  - 0 Medium Wound Dressing one or multiple wounds []  - 0 Large Wound Dressing one or multiple wounds []  - 0 Application of Medications - topical []  - 0 Application of Medications - injection INTERVENTIONS - Miscellaneous []  - 0 External ear exam []  - 0 Specimen Collection (cultures, biopsies, blood, body fluids, etc.) []  - 0 Specimen(s) / Culture(s) sent or taken to Lab for analysis []  - 0 Patient Transfer (multiple staff / Civil Service fast streamer / Similar devices) []  - 0 Simple Staple / Suture removal (25 or less) []  - 0 Complex Staple / Suture removal (26 or more) []  - 0 Hypo / Hyperglycemic Management (close monitor of Blood Glucose) []  - 0 Ankle / Brachial Index (ABI) - do not check if billed separately X- 1 5 Vital Signs Has the patient been seen at the hospital within the last three years: Yes Total Score: 80 Level Of Care: New/Established - Level 3 Electronic Signature(s) Signed: 01/26/2021 3:40:48 PM By: Lorrin Jackson Entered By: Lorrin Jackson on 01/25/2021 09:22:10 -------------------------------------------------------------------------------- Encounter Discharge Information Details Patient Name: Date of Service: [name], [name] 01/25/2021 9:00 A M Medical Record Number: [id number] Patient Account Number: [id number] Date of Birth/Sex: Treating RN: 02/07/1956 (65 y.o. [name] Primary Care Rozina Pointer:  [name] Other Clinician: Referring Guelda Batson: Treating Da Michelle/Extender: [name] in Treatment: 5 Encounter Discharge Information Items Discharge Condition: Stable Ambulatory Status: Ambulatory Discharge Destination: Home Transportation: Private Auto Accompanied By: self Schedule Follow-up Appointment: No Clinical Summary of Care: Patient Declined Electronic Signature(s) Signed: 01/26/2021 5:02:41 PM By: Dellie Catholic RN Entered By: Dellie Catholic on 01/25/2021 09:34:03 -------------------------------------------------------------------------------- Patient/Caregiver Education Details Patient Name: Date of Service: [name], [name] 12/5/2022andnbsp9:00 Welcome Record Number: [id number] Patient Account Number: [id number] Date of Birth/Gender: Treating RN: August 10, 1955 (65 y.o. [name] Primary Care Physician: [name] Other Clinician: Referring Physician: Treating Physician/Extender: [name] in Treatment: 5 Education Assessment Education Provided To: Patient Education Topics Provided Pain: Methods: Explain/Verbal Electronic Signature(s) Signed: 01/26/2021 5:02:41 PM By: Dellie Catholic RN Entered By: Dellie Catholic on 01/25/2021 09:33:22 -------------------------------------------------------------------------------- Wound Assessment Details Patient Name: Date of Service: [name], [name] 01/25/2021 9:00 A M Medical Record Number: [id number] Patient Account Number: [id number] Date of Birth/Sex: Treating RN: 02-28-1955 (65 y.o. [name] Primary Care Elia Keenum: [name] Other Clinician: Referring Glady Ouderkirk: Treating Isobelle Tuckett/Extender: [name] in Treatment: 5 Wound Status Wound Number: 1 Primary Diabetic Wound/Ulcer of the Lower Extremity Etiology: Wound Location: Right Metatarsal head first Wound  Open Wounding Event: Gradually Appeared Status: Date Acquired: 10/29/2020 Comorbid Sleep Apnea, Coronary Artery Disease, Hypertension, Type II Weeks Of Treatment: 5 History: Diabetes, Neuropathy Clustered Wound: No Photos Wound Measurements Length: (cm) 0.4 Width: (cm) 0.5 Depth: (cm) 0.1 Area: (cm) 0.157 Volume: (cm) 0.016 % Reduction in Area: 63% % Reduction in Volume: 61.9% Epithelialization: Medium (34-66%) Tunneling: No Undermining: No Wound Description Classification: Grade 1 Wound Margin: Distinct, outline attached Exudate Amount: Medium Exudate Type: Serosanguineous Exudate Color: red,  brown Foul Odor After Cleansing: No Slough/Fibrino No Wound Bed Granulation Amount: Large (67-100%) Exposed Structure Granulation Quality: Red, Pink Fascia Exposed: No Necrotic Amount: None Present (0%) Fat Layer (Subcutaneous Tissue) Exposed: Yes Tendon Exposed: No Muscle Exposed: No Joint Exposed: No Bone Exposed: No Assessment Notes Calloused Periwound Treatment Notes Wound #1 (Metatarsal head first) Wound Laterality: Right Cleanser Soap and Water Discharge Instruction: May shower and wash wound with dial antibacterial soap and water prior to dressing change. Wound Cleanser Discharge Instruction: Cleanse the wound with wound cleanser prior to applying a clean dressing using gauze sponges, not tissue or cotton balls. Peri-Wound Care Topical Primary Dressing KerraCel Ag Gelling Fiber Dressing, 4x5 in (silver alginate) Discharge Instruction: Apply silver alginate to wound bed as instructed Secondary Dressing Woven Gauze Sponges 2x2 in Discharge Instruction: Apply over primary dressing as directed. Secured With [employer], 4.5x3.1 (in/yd) Discharge Instruction: Secure with Kerlix as directed. 26M Medipore H Soft Cloth Surgical T ape, 4 x 10 (in/yd) Discharge Instruction: Secure with tape as directed. Compression Wrap Compression Stockings Add-Ons Electronic  Signature(s) Signed: 01/26/2021 3:40:48 PM By: Lorrin Jackson Entered By: Lorrin Jackson on 01/25/2021 09:17:47 -------------------------------------------------------------------------------- Wound Assessment Details Patient Name: Date of Service: [name], [name] 01/25/2021 9:00 A M Medical Record Number: [id number] Patient Account Number: [id number] Date of Birth/Sex: Treating RN: January 28, 1956 (65 y.o. [name] Primary Care Anneka Studer: [name] Other Clinician: Referring Brenyn Petrey: Treating Dorthia Tout/Extender: [name] in Treatment: 5 Wound Status Wound Number: 3 Primary Diabetic Wound/Ulcer of the Lower Extremity Etiology: Wound Location: Right, Lateral Foot Wound Open Wounding Event: Shear/Friction Status: Date Acquired: 01/11/2021 Comorbid Sleep Apnea, Coronary Artery Disease, Hypertension, Type II Weeks Of Treatment: 2 History: Diabetes, Neuropathy Clustered Wound: No Photos Wound Measurements Length: (cm) 0.5 Width: (cm) 0.9 Depth: (cm) 0.2 Area: (cm) 0.353 Volume: (cm) 0.071 % Reduction in Area: -124.8% % Reduction in Volume: -343.7% Epithelialization: None Tunneling: No Undermining: No Wound Description Classification: Grade 1 Wound Margin: Epibole Exudate Amount: Medium Exudate Type: Serosanguineous Exudate Color: red, brown Foul Odor After Cleansing: No Slough/Fibrino No Wound Bed Granulation Amount: Large (67-100%) Exposed Structure Granulation Quality: Red, Pink Fascia Exposed: No Necrotic Amount: None Present (0%) Fat Layer (Subcutaneous Tissue) Exposed: Yes Tendon Exposed: No Muscle Exposed: No Joint Exposed: No Bone Exposed: No Assessment Notes Slight maceration Treatment Notes Wound #3 (Foot) Wound Laterality: Right, Lateral Cleanser Soap and Water Discharge Instruction: May shower and wash wound with dial antibacterial soap and water prior to dressing change. Wound  Cleanser Discharge Instruction: Cleanse the wound with wound cleanser prior to applying a clean dressing using gauze sponges, not tissue or cotton balls. Peri-Wound Care Topical Gentamicin Discharge Instruction: As directed by physician Primary Dressing KerraCel Ag Gelling Fiber Dressing, 2x2 in (silver alginate) Discharge Instruction: Apply silver alginate to wound bed as instructed Secondary Dressing Woven Gauze Sponges 2x2 in Discharge Instruction: Apply over primary dressing as directed. Secured With [employer], 4.5x3.1 (in/yd) Discharge Instruction: Secure with Kerlix as directed. 26M Medipore H Soft Cloth Surgical T ape, 4 x 10 (in/yd) Discharge Instruction: Secure with tape as directed. Compression Wrap Compression Stockings Add-Ons Electronic Signature(s) Signed: 01/26/2021 3:40:48 PM By: Lorrin Jackson Entered By: Lorrin Jackson on 01/25/2021 09:16:37 -------------------------------------------------------------------------------- Vitals Details Patient Name: Date of Service: [name], [name] 01/25/2021 9:00 A M Medical Record Number: [id number] Patient Account Number: [id number] Date of Birth/Sex: Treating RN: Jan 24, 1956 (65 y.o. [name] Primary Care Garima Chronis:  [name] Other Clinician: Referring Candy Leverett: Treating Briany Aye/Extender: [name] in Treatment: 5 Vital Signs Time Taken: 09:09 Temperature (F): 98.1 Height (in): 67 Pulse (bpm): 90 Weight (lbs): 277 Respiratory Rate (breaths/min): 18 Body Mass Index (BMI): 43.4 Blood Pressure (mmHg): 166/83 Reference Range: 80 - 120 mg / dl Electronic Signature(s) Signed: 01/26/2021 3:40:48 PM By: Lorrin Jackson Entered By: Lorrin Jackson on 01/25/2021 09:09:50

## 2021-01-26 NOTE — Progress Notes (Signed)
HERMELA, HARDT ([id number]) Visit Report for 01/25/2021 SuperBill Details Patient Name: Date of Service: [name], [name] 01/25/2021 Medical Record Number: [id number] Patient Account Number: [id number] Date of Birth/Sex: Treating RN: 1955-11-13 (65 y.o. [name] Primary Care Provider: [name] Other Clinician: Referring Provider: Treating Provider/Extender: [name] in Treatment: 5 Diagnosis Coding ICD-10 Codes Code Description [phone] Type 2 diabetes mellitus with foot ulcer L97.512 Non-pressure 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 E11.42 Type 2 diabetes mellitus with diabetic polyneuropathy F17.290 Nicotine dependence, other tobacco product, uncomplicated B50.510 Non-pressure chronic ulcer of other part of right foot limited to breakdown of skin L03.115 Cellulitis of right lower limb Facility Procedures CPT4 Code Description Modifier Quantity [id number] - WOUND CARE VISIT-LEV 3 EST PT 1 Electronic Signature(s) Signed: 01/25/2021 10:33:35 AM By: Lorrin Jackson Signed: 01/26/2021 3:41:17 PM By: Kalman Shan DO Entered By: Lorrin Jackson on 01/25/2021 10:33:34

## 2021-01-27 DIAGNOSIS — Z23 Encounter for immunization: Secondary | ICD-10-CM | POA: Diagnosis not present

## 2021-02-01 ENCOUNTER — Other Ambulatory Visit: Payer: Self-pay

## 2021-02-01 ENCOUNTER — Encounter (HOSPITAL_BASED_OUTPATIENT_CLINIC_OR_DEPARTMENT_OTHER): Payer: Medicare Other | Admitting: Internal Medicine

## 2021-02-01 DIAGNOSIS — E11621 Type 2 diabetes mellitus with foot ulcer: Secondary | ICD-10-CM | POA: Diagnosis not present

## 2021-02-01 DIAGNOSIS — E1142 Type 2 diabetes mellitus with diabetic polyneuropathy: Secondary | ICD-10-CM | POA: Diagnosis not present

## 2021-02-01 DIAGNOSIS — L97512 Non-pressure 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: Secondary | ICD-10-CM | POA: Diagnosis not present

## 2021-02-01 DIAGNOSIS — E039 Hypothyroidism, unspecified: Secondary | ICD-10-CM | POA: Diagnosis not present

## 2021-02-01 NOTE — Progress Notes (Signed)
[name], [name] ([id number]) Visit Report for 02/01/2021 Arrival Information Details Patient Name: Date of Service: [name], [name] [name] K. 02/01/2021 11:00 A M Medical Record Number: [id number] Patient Account Number: [id number] Date of Birth/Sex: Treating RN: [birth year] (65 y.o. [name] Tarra Pence: [name] Other Clinician: Referring Krystina Strieter: Treating Melora Menon/Extender: [name] in Treatment: 6 Visit Information History Since Last Visit Added or deleted any medications: No Patient Arrived: Ambulatory Any new allergies or adverse reactions: No Arrival Time: 11:01 Had a fall or experienced change in No Accompanied By: self activities of daily living that may affect Transfer Assistance: None risk of falls: Patient Identification Verified: Yes Signs or symptoms of abuse/neglect since last visito No Patient Requires Transmission-Based Precautions: No Hospitalized since last visit: No Patient Has Alerts: No Implantable device outside of the clinic excluding No cellular tissue based products placed in the center since last visit: Has Dressing in Place as Prescribed: Yes Pain Present Now: No Electronic Signature(s) Signed: 02/01/2021 5:37:41 PM By: Dellie Catholic RN Entered By: Dellie Catholic on 02/01/2021 11:03:41 -------------------------------------------------------------------------------- Encounter Discharge Information Details Patient Name: Date of Service: [name], [name] 02/01/2021 11:00 A M Medical Record Number: [id number] Patient Account Number: [id number] Date of Birth/Sex: Treating RN: [birth year] (65 y.o. [name] Primary Care Davonn Flanery: [name] Other Clinician: Referring Yitzchak Kothari: Treating Benedetto Ryder/Extender: [name] in Treatment: 6 Encounter Discharge Information Items Post Procedure Vitals Discharge Condition: Stable Temperature  (F): 97.9 Ambulatory Status: Ambulatory Pulse (bpm): 82 Discharge Destination: Home Respiratory Rate (breaths/min): 18 Transportation: Private Auto Blood Pressure (mmHg): 148/81 Schedule Follow-up Appointment: Yes Clinical Summary of Care: Provided on 02/01/2021 Form Type Recipient Paper Patient Patient Electronic Signature(s) Signed: 02/01/2021 11:47:00 AM By: Lorrin Jackson Entered By: Lorrin Jackson on 02/01/2021 11:47:00 -------------------------------------------------------------------------------- Lower Extremity Assessment Details Patient Name: Date of Service: [name], [name] 02/01/2021 11:00 A M Medical Record Number: [id number] Patient Account Number: [id number] Date of Birth/Sex: Treating RN: 19-Sep-1955 (65 y.o. [name]: [name] Other Clinician: Referring Deiontae Rabel: Treating Enas Winchel/Extender: [name] in Treatment: 6 Edema Assessment Assessed: [Left: No] [Right: No] Edema: [Left: Ye] [Right: s] Calf Left: Right: Point of Measurement: 32 cm From Medial Instep 45.2 cm Ankle Left: Right: Point of Measurement: 8 cm From Medial Instep 23.5 cm Electronic Signature(s) Signed: 02/01/2021 5:37:41 PM By: Dellie Catholic RN Entered By: Dellie Catholic on 02/01/2021 11:11:20 -------------------------------------------------------------------------------- Multi Wound Chart Details Patient Name: Date of Service: [name], [name] 02/01/2021 11:00 A M Medical Record Number: [id number] Patient Account Number: [id number] Date of Birth/Sex: Treating RN: 12-May-1955 (65 y.o. [name] Primary Care Beaulah Romanek: [name] Other Clinician: Referring Sayeed Weatherall: Treating Britiney Blahnik/Extender: [name] in Treatment: 6 Vital Signs Height(in): 58 Pulse(bpm): 83 Weight(lbs): 450 Blood Pressure(mmHg): 148/81 Body Mass Index(BMI):  43 Temperature(F): 97.9 Respiratory Rate(breaths/min): 18 Photos: [1:No Photos Right Metatarsal head first] [3:No Photos Right, Lateral Foot] [N/A:N/A N/A] Wound Location: [1:Gradually Appeared] [3:Shear/Friction] [N/A:N/A] Wounding Event: [1:Diabetic Wound/Ulcer of the Lower] [3:Diabetic Wound/Ulcer of the Lower] [N/A:N/A] Primary Etiology: [1:Extremity Sleep Apnea, Coronary Artery] [3:Extremity Sleep Apnea, Coronary Artery] [N/A:N/A] Comorbid History: [1:Disease, Hypertension, Type II Diabetes, Neuropathy 10/29/2020] [3:Disease, Hypertension, Type II Diabetes, Neuropathy 01/11/2021] [N/A:N/A] Date Acquired: [1:6] [3:3] [N/A:N/A] Weeks of Treatment: [1:Open] [3:Open] [N/A:N/A] Wound Status: [1:0.4x0.4x0.1] [3:0.2x0.7x0.2] [N/A:N/A] Measurements L x W x D (cm) [1:0.126] [3:0.11] [  N/A:N/A] A (cm) : rea [1:0.013] [3:0.022] [N/A:N/A] Volume (cm) : [1:70.30%] [3:29.90%] [N/A:N/A] % Reduction in Area: [1:69.00%] [3:-37.50%] [N/A:N/A] % Reduction in Volume: [1:Grade 1] [3:Grade 1] [N/A:N/A] Classification: [1:Medium] [3:Medium] [N/A:N/A] Exudate A mount: [1:Serosanguineous] [3:Serosanguineous] [N/A:N/A] Exudate Type: [1:red, brown] [3:red, brown] [N/A:N/A] Exudate Color: [1:Distinct, outline attached] [3:Distinct, outline attached] [N/A:N/A] Wound Margin: [1:Large (67-100%)] [3:Large (67-100%)] [N/A:N/A] Granulation A mount: [1:Red, Pink] [3:Red, Pink] [N/A:N/A] Granulation Quality: [1:Small (1-33%)] [3:None Present (0%)] [N/A:N/A] Necrotic A mount: [1:Fat Layer (Subcutaneous Tissue): Yes Fat Layer (Subcutaneous Tissue): Yes N/A] Exposed Structures: [1:Fascia: No Tendon: No Muscle: No Joint: No Bone: No Small (1-33%)] [3:Fascia: No Tendon: No Muscle: No Joint: No Bone: No None] [N/A:N/A] Epithelialization: [1:Debridement - Selective/Open Wound N/A] [N/A:N/A] Debridement: Pre-procedure Verification/Time Out 11:15 [3:N/A] [N/A:N/A] Taken: [1:Callus] [3:N/A] [N/A:N/A] Tissue Debrided:  [1:Skin/Dermis] [3:N/A] [N/A:N/A] Level: [1:0.16] [3:N/A] [N/A:N/A] Debridement A (sq cm): [1:rea Curette] [3:N/A] [N/A:N/A] Instrument: [1:Minimum] [3:N/A] [N/A:N/A] Bleeding: [1:Pressure] [3:N/A] [N/A:N/A] Hemostasis A chieved: [1:Procedure was tolerated well] [3:N/A] [N/A:N/A] Debridement Treatment Response: [1:0.4x0.4x0.1] [3:N/A] [N/A:N/A] Post Debridement Measurements L x W x D (cm) [1:0.013] [3:N/A] [N/A:N/A] Post Debridement Volume: (cm) [1:Debridement] [3:N/A] [N/A:N/A] Treatment Notes Wound #1 (Metatarsal head first) Wound Laterality: Right Cleanser Soap and Water Discharge Instruction: May shower and wash wound with dial antibacterial soap and water prior to dressing change. Wound Cleanser Discharge Instruction: Cleanse the wound with wound cleanser prior to applying a clean dressing using gauze sponges, not tissue or cotton balls. Peri-Wound Care Topical Primary Dressing KerraCel Ag Gelling Fiber Dressing, 4x5 in (silver alginate) Discharge Instruction: Apply silver alginate to wound bed as instructed Secondary Dressing Woven Gauze Sponges 2x2 in Discharge Instruction: Apply over primary dressing as directed. Zetuvit Plus Silicone Border Dressing 4x4 (in/in) Discharge Instruction: Apply silicone border over primary dressing as directed. Secured With Compression Wrap Compression Stockings Add-Ons Wound #3 (Foot) Wound Laterality: Right, Lateral Cleanser Soap and Water Discharge Instruction: May shower and wash wound with dial antibacterial soap and water prior to dressing change. Wound Cleanser Discharge Instruction: Cleanse the wound with wound cleanser prior to applying a clean dressing using gauze sponges, not tissue or cotton balls. Peri-Wound Care Topical Primary Dressing KerraCel Ag Gelling Fiber Dressing, 2x2 in (silver alginate) Discharge Instruction: Apply silver alginate to wound bed as instructed Secondary Dressing Woven Gauze Sponges 2x2  in Discharge Instruction: Apply over primary dressing as directed. Zetuvit Plus Silicone Border Dressing 4x4 (in/in) Discharge Instruction: Apply silicone border over primary dressing as directed. Secured With Compression Wrap Compression Stockings Environmental education officer) Signed: 02/01/2021 1:44:06 PM By: Kalman Shan DO Signed: 02/01/2021 5:29:47 PM By: Lorrin Jackson Entered By: Kalman Shan on 02/01/2021 13:39:53 -------------------------------------------------------------------------------- Multi-Disciplinary Care Plan Details Patient Name: Date of Service: [name], [name] 02/01/2021 11:00 A M Medical Record Number: [id number] Patient Account Number: [id number] Date of Birth/Sex: Treating RN: 05-Mar-1955 (65 y.o. [name] Primary Care Chayne Baumgart: [name] Other Clinician: Referring Quinley Nesler: Treating Doyal Saric/Extender: [name] in Treatment: 6 Active Inactive Nutrition Nursing Diagnoses: Impaired glucose control: actual or potential Goals: Patient/caregiver verbalizes understanding of need to maintain therapeutic glucose control per primary care physician Date Initiated: 12/17/2020 Target Resolution Date: 03/01/2021 Goal Status: Active Interventions: Assess HgA1c results as ordered upon admission and as needed Assess patient nutrition upon admission and as needed per policy Provide education on elevated blood sugars and impact on wound healing Notes: 02/01/21: Glucose control ongoing Wound/Skin Impairment Nursing Diagnoses: Impaired tissue integrity Goals: Patient/caregiver will verbalize understanding  of skin care regimen Date Initiated: 12/17/2020 Target Resolution Date: 03/01/2021 Goal Status: Active Ulcer/skin breakdown will have a volume reduction of 30% by week 4 Date Initiated: 12/17/2020 Date Inactivated: 02/01/2021 Target Resolution Date: 01/21/2021 Goal Status: Met Ulcer/skin  breakdown will have a volume reduction of 50% by week 8 Date Initiated: 02/01/2021 Target Resolution Date: 03/01/2021 Goal Status: Active Interventions: Assess patient/caregiver ability to obtain necessary supplies Assess patient/caregiver ability to perform ulcer/skin care regimen upon admission and as needed Assess ulceration(s) every visit Provide education on ulcer and skin care Treatment Activities: Topical wound management initiated : 12/17/2020 Notes: Electronic Signature(s) Signed: 02/01/2021 5:29:47 PM By: Lorrin Jackson Entered By: Lorrin Jackson on 02/01/2021 11:27:29 -------------------------------------------------------------------------------- Pain Assessment Details Patient Name: Date of Service: [name], [name] 02/01/2021 11:00 A M Medical Record Number: [id number] Patient Account Number: [id number] Date of Birth/Sex: Treating RN: 01/10/1956 (65 y.o. [name] Sharanda Shinault: [name] Other Clinician: Referring Nazaire Cordial: Treating Yesenia Locurto/Extender: [name] in Treatment: 6 Active Problems Location of Pain Severity and Description of Pain Patient Has Paino No Site Locations Pain Management and Medication Current Pain Management: Electronic Signature(s) Signed: 02/01/2021 5:37:41 PM By: Dellie Catholic RN Entered By: Dellie Catholic on 02/01/2021 11:04:29 -------------------------------------------------------------------------------- Patient/Caregiver Education Details Patient Name: Date of Service: [name], [name] 12/12/2022andnbsp11:00 [place] Record Number: [id number] Patient Account Number: [id number] Date of Birth/Gender: Treating RN: Sep 26, 1955 (65 y.o. [name] Primary Care Physician: [name] Other Clinician: Referring Physician: Treating Physician/Extender: [name] in Treatment: 6 Education Assessment Education  Provided To: Patient Education Topics Provided Elevated Blood Sugar/ Impact on Healing: Methods: Explain/Verbal, Printed Responses: State content correctly Offloading: Methods: Explain/Verbal, Printed Responses: State content correctly Wound/Skin Impairment: Methods: Explain/Verbal, Printed Responses: State content correctly Electronic Signature(s) Signed: 02/01/2021 5:29:47 PM By: Lorrin Jackson Entered By: Lorrin Jackson on 02/01/2021 11:28:16 -------------------------------------------------------------------------------- Wound Assessment Details Patient Name: Date of Service: [name], [name] 02/01/2021 11:00 A M Medical Record Number: [id number] Patient Account Number: [id number] Date of Birth/Sex: Treating RN: 08-22-1955 (65 y.o. [name] Alonni Heimsoth: [name] Other Clinician: Referring Josephus Harriger: Treating Meshach Perry/Extender: [name] in Treatment: 6 Wound Status Wound Number: 1 Primary Diabetic Wound/Ulcer of the Lower Extremity Etiology: Wound Location: Right Metatarsal head first Wound Open Wounding Event: Gradually Appeared Status: Date Acquired: 10/29/2020 Comorbid Sleep Apnea, Coronary Artery Disease, Hypertension, Type II Weeks Of Treatment: 6 History: Diabetes, Neuropathy Clustered Wound: No Wound Measurements Length: (cm) 0.4 Width: (cm) 0.4 Depth: (cm) 0.1 Area: (cm) 0.126 Volume: (cm) 0.013 % Reduction in Area: 70.3% % Reduction in Volume: 69% Epithelialization: Small (1-33%) Tunneling: No Undermining: No Wound Description Classification: Grade 1 Wound Margin: Distinct, outline attached Exudate Amount: Medium Exudate Type: Serosanguineous Exudate Color: red, brown Foul Odor After Cleansing: No Slough/Fibrino Yes Wound Bed Granulation Amount: Large (67-100%) Exposed Structure Granulation Quality: Red, Pink Fascia Exposed: No Necrotic Amount: Small (1-33%) Fat Layer  (Subcutaneous Tissue) Exposed: Yes Necrotic Quality: Adherent Slough Tendon Exposed: No Muscle Exposed: No Joint Exposed: No Bone Exposed: No Treatment Notes Wound #1 (Metatarsal head first) Wound Laterality: Right Cleanser Soap and Water Discharge Instruction: May shower and wash wound with dial antibacterial soap and water prior to dressing change. Wound Cleanser Discharge Instruction: Cleanse the wound with wound cleanser prior to applying a clean dressing using gauze sponges, not tissue or cotton balls. Peri-Wound Care Topical  Primary Dressing KerraCel Ag Gelling Fiber Dressing, 4x5 in (silver alginate) Discharge Instruction: Apply silver alginate to wound bed as instructed Secondary Dressing Woven Gauze Sponges 2x2 in Discharge Instruction: Apply over primary dressing as directed. Zetuvit Plus Silicone Border Dressing 4x4 (in/in) Discharge Instruction: Apply silicone border over primary dressing as directed. Secured With Compression Wrap Compression Stockings Environmental education officer) Signed: 02/01/2021 5:37:41 PM By: Dellie Catholic RN Entered By: Dellie Catholic on 02/01/2021 11:14:36 -------------------------------------------------------------------------------- Wound Assessment Details Patient Name: Date of Service: [name], [name] 02/01/2021 11:00 A M Medical Record Number: [id number] Patient Account Number: [id number] Date of Birth/Sex: Treating RN: [birth year] (65 y.o. [name] Bathsheba Durrett: [name] Other Clinician: Referring Shann Merrick: Treating Nakai Pollio/Extender: [name] in Treatment: 6 Wound Status Wound Number: 3 Primary Diabetic Wound/Ulcer of the Lower Extremity Etiology: Wound Location: Right, Lateral Foot Wound Open Wounding Event: Shear/Friction Status: Date Acquired: 01/11/2021 Comorbid Sleep Apnea, Coronary Artery Disease, Hypertension, Type II Weeks Of Treatment:  3 History: Diabetes, Neuropathy Clustered Wound: No Wound Measurements Length: (cm) 0.2 Width: (cm) 0.7 Depth: (cm) 0.2 Area: (cm) 0.11 Volume: (cm) 0.022 % Reduction in Area: 29.9% % Reduction in Volume: -37.5% Epithelialization: None Tunneling: No Undermining: No Wound Description Classification: Grade 1 Wound Margin: Distinct, outline attached Exudate Amount: Medium Exudate Type: Serosanguineous Exudate Color: red, brown Foul Odor After Cleansing: No Slough/Fibrino No Wound Bed Granulation Amount: Large (67-100%) Exposed Structure Granulation Quality: Red, Pink Fascia Exposed: No Necrotic Amount: None Present (0%) Fat Layer (Subcutaneous Tissue) Exposed: Yes Tendon Exposed: No Muscle Exposed: No Joint Exposed: No Bone Exposed: No Treatment Notes Wound #3 (Foot) Wound Laterality: Right, Lateral Cleanser Soap and Water Discharge Instruction: May shower and wash wound with dial antibacterial soap and water prior to dressing change. Wound Cleanser Discharge Instruction: Cleanse the wound with wound cleanser prior to applying a clean dressing using gauze sponges, not tissue or cotton balls. Peri-Wound Care Topical Primary Dressing KerraCel Ag Gelling Fiber Dressing, 2x2 in (silver alginate) Discharge Instruction: Apply silver alginate to wound bed as instructed Secondary Dressing Woven Gauze Sponges 2x2 in Discharge Instruction: Apply over primary dressing as directed. Zetuvit Plus Silicone Border Dressing 4x4 (in/in) Discharge Instruction: Apply silicone border over primary dressing as directed. Secured With Compression Wrap Compression Stockings Environmental education officer) Signed: 02/01/2021 5:37:41 PM By: Dellie Catholic RN Entered By: Dellie Catholic on 02/01/2021 11:16:43 -------------------------------------------------------------------------------- Vitals Details Patient Name: Date of Service: [name], [name] 02/01/2021 11:00 A  M Medical Record Number: [id number] Patient Account Number: [id number] Date of Birth/Sex: Treating RN: June 05, 1955 (65 y.o. [name] Maisley Hainsworth: [name] Other Clinician: Referring Cassey Hurrell: Treating Meko Masterson/Extender: [name] in Treatment: 6 Vital Signs Time Taken: 11:01 Temperature (F): 97.9 Height (in): 67 Pulse (bpm): 82 Weight (lbs): 277 Respiratory Rate (breaths/min): 18 Body Mass Index (BMI): 43.4 Blood Pressure (mmHg): 148/81 Reference Range: 80 - 120 mg / dl Electronic Signature(s) Signed: 02/01/2021 5:37:41 PM By: Dellie Catholic RN Entered By: Dellie Catholic on 02/01/2021 11:04:20

## 2021-02-01 NOTE — Progress Notes (Signed)
KARISTA, AISPURO ([id number]) Visit Report for 02/01/2021 Chief Complaint Document Details Patient Name: Date of Service: [name], [name] 02/01/2021 11:00 A M Medical Record Number: [id number] Patient Account Number: [id number] Date of Birth/Sex: Treating RN: 1955/08/07 (65 y.o. [name] Primary Care Provider: Geoffery [name] Other Clinician: Referring Provider: Treating Provider/Extender: [name] in Treatment: 6 Information Obtained from: Patient Chief Complaint 10/27; Right Plantar foot wound 11/21; 2 small areas of skin breakdown to the right foot following cast placement Electronic Signature(s) Signed: 02/01/2021 1:44:06 PM By: Kalman Shan DO Entered By: Kalman Shan on 02/01/2021 13:40:01 -------------------------------------------------------------------------------- Debridement Details Patient Name: Date of Service: [name], [name] 02/01/2021 11:00 A M Medical Record Number: [id number] Patient Account Number: [id number] Date of Birth/Sex: Treating RN: Aug 04, 1955 (65 y.o. [name] Primary Care Provider: Geoffery [name] Other Clinician: Referring Provider: Treating Provider/Extender: [name] in Treatment: 6 Debridement Performed for Assessment: Wound #1 Right Metatarsal head first Performed By: Physician Kalman Shan, DO Debridement Type: Debridement Severity of Tissue Pre Debridement: Fat layer exposed Level of Consciousness (Pre-procedure): Awake and Alert Pre-procedure Verification/Time Out Yes - 11:15 Taken: Start Time: 11:16 T Area Debrided (L x W): otal 0.4 (cm) x 0.4 (cm) = 0.16 (cm) Tissue and other material debrided: Callus, Skin: Dermis Level: Skin/Dermis Debridement Description: Selective/Open Wound Instrument: Curette Bleeding: Minimum Hemostasis Achieved: Pressure End Time: 11:20 Response to Treatment: Procedure was tolerated  well Level of Consciousness (Post- Awake and Alert procedure): Post Debridement Measurements of Total Wound Length: (cm) 0.4 Width: (cm) 0.4 Depth: (cm) 0.1 Volume: (cm) 0.013 Character of Wound/Ulcer Post Debridement: Stable Severity of Tissue Post Debridement: Fat layer exposed Post Procedure Diagnosis Same as Pre-procedure Electronic Signature(s) Signed: 02/01/2021 1:44:06 PM By: Kalman Shan DO Signed: 02/01/2021 5:29:47 PM By: Lorrin Jackson Entered By: Lorrin Jackson on 02/01/2021 11:21:21 -------------------------------------------------------------------------------- HPI Details Patient Name: Date of Service: [name], [name] 02/01/2021 11:00 A M Medical Record Number: [id number] Patient Account Number: [id number] Date of Birth/Sex: Treating RN: 1955-08-15 (65 y.o. [name] Primary Care Provider: Geoffery [name] Other Clinician: Referring Provider: Treating Provider/Extender: [name] in Treatment: 6 History of Present Illness HPI Description: Admission 12/17/2020 [name] a 65 year old female with a past medical history of insulin-dependent type 2 diabetes, hypothyroidism and daily1 pack per day cigarette smoker the presents to the clinic for a 6-week history of nonhealing wound to the right first MTPJ. She has been following with Dr. Amalia [name], podiatry for this issue. She has been using silver alginate with dressing changes. She uses a postsurgical shoe and offloading pads. She currently denies signs of infection. 10/31; patient presents for follow-up. She tolerated the soft cast fine although she states that she felt her foot rolling to one side. She denies signs of infection. She would like to do the total contact cast today. 12/23/2020 upon evaluation today patient appears to be doing excellent in regard to her wound on the foot and she is in a total contact cast. I do think this is appropriate this  is the first cast change which we are obliged to do to ensure nothing is rubbing everything appears to be doing quite well and very pleased in that regard. 11/7; patient presents for follow-up. She had no issues with the cast. She denies signs of infection. 11/14; patient presents for 1 week follow-up. She has had no issues  with the cast. She denies signs of infection. 11/21; patient presents for 1 week follow-up. She did develop 2 small areas of skin breakdown on either side of the right foot from the cast rubbing. She states she did not feel the wounds developing. She currently denies signs of infection. 11/28; patient presents for 1 week follow-up. She has no issues or complaints today. She denies pain or acute signs of infection. 12/12; patient presents for follow-up. She reports improvement to her right lateral foot wound. She has been using silver alginate to the area. She has no issues or complaints today. Electronic Signature(s) Signed: 02/01/2021 1:44:06 PM By: Kalman Shan DO Entered By: Kalman Shan on 02/01/2021 13:40:31 -------------------------------------------------------------------------------- Physical Exam Details Patient Name: Date of Service: [name], [name] 02/01/2021 11:00 A M Medical Record Number: [id number] Patient Account Number: [id number] Date of Birth/Sex: Treating RN: 27-Jul-1955 (65 y.o. [name] Primary Care Provider: Geoffery [name] Other Clinician: Referring Provider: Treating Provider/Extender: [name] in Treatment: 6 Constitutional respirations regular, non-labored and within target range for patient.. Cardiovascular 2+ dorsalis pedis/posterior tibialis pulses. Psychiatric pleasant and cooperative. Notes Right first met head with surrounding callus and epithelialization to the previous wound site. T the right lateral foot there is a small open wound with granulation o tissue present. No  signs of infection. Electronic Signature(s) Signed: 02/01/2021 1:44:06 PM By: Kalman Shan DO Entered By: Kalman Shan on 02/01/2021 13:41:34 -------------------------------------------------------------------------------- Physician Orders Details Patient Name: Date of Service: [name], [name] 02/01/2021 11:00 A M Medical Record Number: [id number] Patient Account Number: [id number] Date of Birth/Sex: Treating RN: [birth year] (65 y.o. [name] Primary Care Provider: Geoffery [name] Other Clinician: Referring Provider: Treating Provider/Extender: [name] in Treatment: 6 Verbal / Phone Orders: No Diagnosis Coding ICD-10 Coding Code Description E11.621 Type 2 diabetes mellitus with foot ulcer L97.512 Non-pressure 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 E11.42 Type 2 diabetes mellitus with diabetic polyneuropathy F17.290 Nicotine dependence, other tobacco product, uncomplicated B63.845 Non-pressure chronic ulcer of other part of right foot limited to breakdown of skin Follow-up Appointments ppointment in 1 week. - with Dr. Heber [place] Return A Bathing/ Shower/ Hygiene May shower with protection but do not get wound dressing(s) wet. Edema Control - Lymphedema / SCD / Other Elevate legs to the level of the heart or above for 30 minutes daily and/or when sitting, a frequency of: - throughout the day Avoid standing for long periods of time. Off-Loading Open toe surgical shoe to: - to right foot Wound Treatment Wound #1 - Metatarsal head first Wound Laterality: Right Cleanser: Soap and Water 1 x Per Day/15 Days Discharge Instructions: May shower and wash wound with dial antibacterial soap and water prior to dressing change. Cleanser: Wound Cleanser 1 x Per Day/15 Days Discharge Instructions: Cleanse the wound with wound cleanser prior to applying a clean dressing using gauze sponges, not tissue or cotton  balls. Prim Dressing: KerraCel Ag Gelling Fiber Dressing, 4x5 in (silver alginate) 1 x Per Day/15 Days ary Discharge Instructions: Apply silver alginate to wound bed as instructed Secondary Dressing: Woven Gauze Sponges 2x2 in 1 x Per Day/15 Days Discharge Instructions: Apply over primary dressing as directed. Secondary Dressing: Zetuvit Plus Silicone Border Dressing 4x4 (in/in) 1 x Per Day/15 Days Discharge Instructions: Apply silicone border over primary dressing as directed. Wound #3 - Foot Wound Laterality: Right, Lateral Cleanser: Soap and Water 1  x Per Day/30 Days Discharge Instructions: May shower and wash wound with dial antibacterial soap and water prior to dressing change. Cleanser: Wound Cleanser 1 x Per Day/30 Days Discharge Instructions: Cleanse the wound with wound cleanser prior to applying a clean dressing using gauze sponges, not tissue or cotton balls. Prim Dressing: KerraCel Ag Gelling Fiber Dressing, 2x2 in (silver alginate) 1 x Per Day/30 Days ary Discharge Instructions: Apply silver alginate to wound bed as instructed Secondary Dressing: Woven Gauze Sponges 2x2 in 1 x Per Day/30 Days Discharge Instructions: Apply over primary dressing as directed. Secondary Dressing: Zetuvit Plus Silicone Border Dressing 4x4 (in/in) 1 x Per Day/30 Days Discharge Instructions: Apply silicone border over primary dressing as directed. Electronic Signature(s) Signed: 02/01/2021 1:44:06 PM By: Kalman Shan DO Entered By: Kalman Shan on 02/01/2021 13:41:52 -------------------------------------------------------------------------------- Problem List Details Patient Name: Date of Service: [name], [name] 02/01/2021 11:00 A M Medical Record Number: [id number] Patient Account Number: [id number] Date of Birth/Sex: Treating RN: [birth year] (65 y.o. [name] Primary Care Provider: Geoffery [name] Other Clinician: Referring Provider: Treating Provider/Extender:  [name] in Treatment: 6 Active Problems ICD-10 Encounter Code Description Active Date MDM Diagnosis E11.621 Type 2 diabetes mellitus with foot ulcer 12/17/2020 No Yes L97.512 Non-pressure 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 12/21/2020 No Yes E11.42 Type 2 diabetes mellitus with diabetic polyneuropathy 12/17/2020 No Yes F17.290 Nicotine dependence, other tobacco product, uncomplicated 71/24/5809 No Yes L97.511 Non-pressure chronic ulcer of other part of right foot limited to breakdown of 01/11/2021 No Yes skin Inactive Problems Resolved Problems Electronic Signature(s) Signed: 02/01/2021 1:44:06 PM By: Kalman Shan DO Entered By: Kalman Shan on 02/01/2021 13:39:47 -------------------------------------------------------------------------------- Progress Note Details Patient Name: Date of Service: [place], [name] 02/01/2021 11:00 A M Medical Record Number: [id number] Patient Account Number: [id number] Date of Birth/Sex: Treating RN: Aug 08, 1955 (65 y.o. [name] Primary Care Provider: Geoffery [name] Other Clinician: Referring Provider: Treating Provider/Extender: [name] in Treatment: 6 Subjective Chief Complaint Information obtained from Patient 10/27; Right Plantar foot wound 11/21; 2 small areas of skin breakdown to the right foot following cast placement History of Present Illness (HPI) Admission 12/17/2020 [name] a 65 year old female with a past medical history of insulin-dependent type 2 diabetes, hypothyroidism and daily1 pack per day cigarette smoker the presents to the clinic for a 6-week history of nonhealing wound to the right first MTPJ. She has been following with Dr. Amalia [name], podiatry for this issue. She has been using silver alginate with dressing changes. She uses a postsurgical shoe and offloading pads. She currently denies  signs of infection. 10/31; patient presents for follow-up. She tolerated the soft cast fine although she states that she felt her foot rolling to one side. She denies signs of infection. She would like to do the total contact cast today. 12/23/2020 upon evaluation today patient appears to be doing excellent in regard to her wound on the foot and she is in a total contact cast. I do think this is appropriate this is the first cast change which we are obliged to do to ensure nothing is rubbing everything appears to be doing quite well and very pleased in that regard. 11/7; patient presents for follow-up. She had no issues with the cast. She denies signs of infection. 11/14; patient presents for 1 week follow-up. She has had no issues with the cast. She denies  signs of infection. 11/21; patient presents for 1 week follow-up. She did develop 2 small areas of skin breakdown on either side of the right foot from the cast rubbing. She states she did not feel the wounds developing. She currently denies signs of infection. 11/28; patient presents for 1 week follow-up. She has no issues or complaints today. She denies pain or acute signs of infection. 12/12; patient presents for follow-up. She reports improvement to her right lateral foot wound. She has been using silver alginate to the area. She has no issues or complaints today. Patient History Information obtained from Patient, Chart. Family History Unknown History. Social History Current every day smoker - 1 pack/a/day, Alcohol Use - Never, Drug Use - No History, Caffeine Use - Moderate. Medical History Eyes Denies history of Cataracts, Glaucoma, Optic Neuritis Ear/Nose/Mouth/Throat Denies history of Chronic sinus problems/congestion, Middle ear problems Hematologic/Lymphatic Denies history of Anemia, Hemophilia, Human Immunodeficiency Virus, Lymphedema, Sickle Cell Disease Respiratory Patient has history of Sleep Apnea Denies history of  Aspiration, Asthma, Chronic Obstructive Pulmonary Disease (COPD), Pneumothorax, Tuberculosis Cardiovascular Patient has history of Coronary Artery Disease, Hypertension Denies history of Angina, Arrhythmia, Congestive Heart Failure, Deep Vein Thrombosis, Hypotension, Myocardial Infarction, Peripheral Arterial Disease, Peripheral Venous Disease, Phlebitis, Vasculitis Gastrointestinal Denies history of Cirrhosis , Colitis, Crohnoos, Hepatitis A, Hepatitis B, Hepatitis C Endocrine Patient has history of Type II Diabetes Denies history of Type I Diabetes Genitourinary Denies history of End Stage Renal Disease Immunological Denies history of Lupus Erythematosus, Raynaudoos, Scleroderma Integumentary (Skin) Denies history of History of Burn Musculoskeletal Denies history of Gout, Rheumatoid Arthritis, Osteoarthritis, Osteomyelitis Neurologic Patient has history of Neuropathy Denies history of Dementia, Quadriplegia, Paraplegia, Seizure Disorder Hospitalization/Surgery History - 2nd and 3rd right toe amps. - total hysterectomy. - cholecystectomy. - 2 knee surgery's on left and 1 on right. Medical A Surgical History Notes nd Cardiovascular hypercholesterolemia Objective Constitutional respirations regular, non-labored and within target range for patient.. Vitals Time Taken: 11:01 AM, Height: 67 in, Weight: 277 lbs, BMI: 43.4, Temperature: 97.9 F, Pulse: 82 bpm, Respiratory Rate: 18 breaths/min, Blood Pressure: 148/81 mmHg. Cardiovascular 2+ dorsalis pedis/posterior tibialis pulses. Psychiatric pleasant and cooperative. General Notes: Right first met head with surrounding callus and epithelialization to the previous wound site. T the right lateral foot there is a small open wound o with granulation tissue present. No signs of infection. Integumentary (Hair, Skin) Wound #1 status is Open. Original cause of wound was Gradually Appeared. The date acquired was: 10/29/2020. The wound has  been in treatment 6 weeks. The wound is located on the Right Metatarsal head first. The wound measures 0.4cm length x 0.4cm width x 0.1cm depth; 0.126cm^2 area and 0.013cm^3 volume. There is Fat Layer (Subcutaneous Tissue) exposed. There is no tunneling or undermining noted. There is a medium amount of serosanguineous drainage noted. The wound margin is distinct with the outline attached to the wound base. There is large (67-100%) red, pink granulation within the wound bed. There is a small (1-33%) amount of necrotic tissue within the wound bed including Adherent Slough. Wound #3 status is Open. Original cause of wound was Shear/Friction. The date acquired was: 01/11/2021. The wound has been in treatment 3 weeks. The wound is located on the Right,Lateral Foot. The wound measures 0.2cm length x 0.7cm width x 0.2cm depth; 0.11cm^2 area and 0.022cm^3 volume. There is Fat Layer (Subcutaneous Tissue) exposed. There is no tunneling or undermining noted. There is a medium amount of serosanguineous drainage noted. The wound margin is distinct with  the outline attached to the wound base. There is large (67-100%) red, pink granulation within the wound bed. There is no necrotic tissue within the wound bed. Assessment Active Problems ICD-10 Type 2 diabetes mellitus with foot ulcer Non-pressure 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 Type 2 diabetes mellitus with diabetic polyneuropathy Nicotine dependence, other tobacco product, uncomplicated Non-pressure chronic ulcer of other part of right foot limited to breakdown of skin Patient's right lateral wound has shown significant improvement since last clinic visit. I recommended continuing silver alginate. The plantar first met head wound has dried blood although this still is epithelialized after debridement. No signs of infection on exam. At this time I recommended a Pegasys insert to help with offloading the first met  head. Procedures Wound #1 Pre-procedure diagnosis of Wound #1 is a Diabetic Wound/Ulcer of the Lower Extremity located on the Right Metatarsal head first .Severity of Tissue Pre Debridement is: Fat layer exposed. There was a Selective/Open Wound Skin/Dermis Debridement with a total area of 0.16 sq cm performed by Kalman Shan, DO. With the following instrument(s): Curette Material removed includes Callus and Skin: Dermis and. No specimens were taken. A time out was conducted at 11:15, prior to the start of the procedure. A Minimum amount of bleeding was controlled with Pressure. The procedure was tolerated well. Post Debridement Measurements: 0.4cm length x 0.4cm width x 0.1cm depth; 0.013cm^3 volume. Character of Wound/Ulcer Post Debridement is stable. Severity of Tissue Post Debridement is: Fat layer exposed. Post procedure Diagnosis Wound #1: Same as Pre-Procedure Plan Follow-up Appointments: Return Appointment in 1 week. - with Dr. Heber [place] Bathing/ Shower/ Hygiene: May shower with protection but do not get wound dressing(s) wet. Edema Control - Lymphedema / SCD / Other: Elevate legs to the level of the heart or above for 30 minutes daily and/or when sitting, a frequency of: - throughout the day Avoid standing for long periods of time. Off-Loading: Open toe surgical shoe to: - to right foot WOUND #1: - Metatarsal head first Wound Laterality: Right Cleanser: Soap and Water 1 x Per Day/15 Days Discharge Instructions: May shower and wash wound with dial antibacterial soap and water prior to dressing change. Cleanser: Wound Cleanser 1 x Per Day/15 Days Discharge Instructions: Cleanse the wound with wound cleanser prior to applying a clean dressing using gauze sponges, not tissue or cotton balls. Prim Dressing: KerraCel Ag Gelling Fiber Dressing, 4x5 in (silver alginate) 1 x Per Day/15 Days ary Discharge Instructions: Apply silver alginate to wound bed as instructed Secondary Dressing:  Woven Gauze Sponges 2x2 in 1 x Per Day/15 Days Discharge Instructions: Apply over primary dressing as directed. Secondary Dressing: Zetuvit Plus Silicone Border Dressing 4x4 (in/in) 1 x Per Day/15 Days Discharge Instructions: Apply silicone border over primary dressing as directed. WOUND #3: - Foot Wound Laterality: Right, Lateral Cleanser: Soap and Water 1 x Per Day/30 Days Discharge Instructions: May shower and wash wound with dial antibacterial soap and water prior to dressing change. Cleanser: Wound Cleanser 1 x Per Day/30 Days Discharge Instructions: Cleanse the wound with wound cleanser prior to applying a clean dressing using gauze sponges, not tissue or cotton balls. Prim Dressing: KerraCel Ag Gelling Fiber Dressing, 2x2 in (silver alginate) 1 x Per Day/30 Days ary Discharge Instructions: Apply silver alginate to wound bed as instructed Secondary Dressing: Woven Gauze Sponges 2x2 in 1 x Per Day/30 Days Discharge Instructions: Apply over primary dressing as directed. Secondary Dressing: Zetuvit Plus Silicone Border Dressing 4x4 (in/in)  1 x Per Day/30 Days Discharge Instructions: Apply silicone border over primary dressing as directed. 1. In office sharp debridement 2. Pegasys shoe for offloading 3. Continue silver alginate to the lateral aspect of the right foot Electronic Signature(s) Signed: 02/01/2021 1:44:06 PM By: Kalman Shan DO Entered By: Kalman Shan on 02/01/2021 13:43:20 -------------------------------------------------------------------------------- HxROS Details Patient Name: Date of Service: [name], [name] 02/01/2021 11:00 A M Medical Record Number: [id number] Patient Account Number: [id number] Date of Birth/Sex: Treating RN: 01-Oct-1955 (65 y.o. [name] Primary Care Provider: Geoffery [name] Other Clinician: Referring Provider: Treating Provider/Extender: [name] in Treatment: 6 Information Obtained  From Patient Chart Eyes Medical History: Negative for: Cataracts; Glaucoma; Optic Neuritis Ear/Nose/Mouth/Throat Medical History: Negative for: Chronic sinus problems/congestion; Middle ear problems Hematologic/Lymphatic Medical History: Negative for: Anemia; Hemophilia; Human Immunodeficiency Virus; Lymphedema; Sickle Cell Disease Respiratory Medical History: Positive for: Sleep Apnea Negative for: Aspiration; Asthma; Chronic Obstructive Pulmonary Disease (COPD); Pneumothorax; Tuberculosis Cardiovascular Medical History: Positive for: Coronary Artery Disease; Hypertension Negative for: Angina; Arrhythmia; Congestive Heart Failure; Deep Vein Thrombosis; Hypotension; Myocardial Infarction; Peripheral Arterial Disease; Peripheral Venous Disease; Phlebitis; Vasculitis Past Medical History Notes: hypercholesterolemia Gastrointestinal Medical History: Negative for: Cirrhosis ; Colitis; Crohns; Hepatitis A; Hepatitis B; Hepatitis C Endocrine Medical History: Positive for: Type II Diabetes Negative for: Type I Diabetes Genitourinary Medical History: Negative for: End Stage Renal Disease Immunological Medical History: Negative for: Lupus Erythematosus; Raynauds; Scleroderma Integumentary (Skin) Medical History: Negative for: History of Burn Musculoskeletal Medical History: Negative for: Gout; Rheumatoid Arthritis; Osteoarthritis; Osteomyelitis Neurologic Medical History: Positive for: Neuropathy Negative for: Dementia; Quadriplegia; Paraplegia; Seizure Disorder Immunizations Pneumococcal Vaccine: Received Pneumococcal Vaccination: Yes Received Pneumococcal Vaccination On or After 60th Birthday: Yes Tetanus Vaccine: Last tetanus shot: 12/17/2020 Implantable Devices No devices added Hospitalization / Surgery History Type of Hospitalization/Surgery 2nd and 3rd right toe amps total hysterectomy cholecystectomy 2 knee surgery's on left and 1 on right Family and Social  History Unknown History: Yes; Current every day smoker - 1 pack/a/day; Alcohol Use: Never; Drug Use: No History; Caffeine Use: Moderate; Financial Concerns: No; Food, Clothing or Shelter Needs: No; Support System Lacking: No; Transportation Concerns: No Electronic Signature(s) Signed: 02/01/2021 1:44:06 PM By: Kalman Shan DO Signed: 02/01/2021 5:29:47 PM By: Lorrin Jackson Entered By: Kalman Shan on 02/01/2021 13:40:38 -------------------------------------------------------------------------------- SuperBill Details Patient Name: Date of Service: [name], [name] 02/01/2021 Medical Record Number: [id number] Patient Account Number: [id number] Date of Birth/Sex: Treating RN: [birth year] (64 y.o. [name] Primary Care Provider: Geoffery [name] Other Clinician: Referring Provider: Treating Provider/Extender: [name] in Treatment: 6 Diagnosis Coding ICD-10 Codes Code Description [phone] Type 2 diabetes mellitus with foot ulcer L97.512 Non-pressure 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 E11.42 Type 2 diabetes mellitus with diabetic polyneuropathy F17.290 Nicotine dependence, other tobacco product, uncomplicated S92.330 Non-pressure chronic ulcer of other part of right foot limited to breakdown of skin L03.115 Cellulitis of right lower limb Facility Procedures CPT4 Code: [id number] Description: [phone] - DEBRIDE WOUND 1ST 20 SQ CM OR < ICD-10 Diagnosis Description L97.512 Non-pressure 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 Modifier: Quantity: 1 Physician Procedures : CPT4 Code Description Modifier [id number] - WC PHYS DEBR WO ANESTH 20 SQ CM ICD-10 Diagnosis Description L97.512 Non-pressure 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 Quantity: 1 Electronic Signature(s) Signed: 02/01/2021 1:44:06 PM By: Heber Learned,  Janett Billow DO Entered By: Kalman Shan  on 02/01/2021 13:43:47

## 2021-02-02 DIAGNOSIS — K130 Diseases of lips: Secondary | ICD-10-CM | POA: Diagnosis not present

## 2021-02-08 ENCOUNTER — Encounter (HOSPITAL_BASED_OUTPATIENT_CLINIC_OR_DEPARTMENT_OTHER): Payer: Medicare Other | Admitting: Internal Medicine

## 2021-02-08 ENCOUNTER — Other Ambulatory Visit: Payer: Self-pay

## 2021-02-08 DIAGNOSIS — L97512 Non-pressure 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: Secondary | ICD-10-CM | POA: Diagnosis not present

## 2021-02-08 DIAGNOSIS — E039 Hypothyroidism, unspecified: Secondary | ICD-10-CM | POA: Diagnosis not present

## 2021-02-08 DIAGNOSIS — E11621 Type 2 diabetes mellitus with foot ulcer: Secondary | ICD-10-CM

## 2021-02-08 DIAGNOSIS — F17290 Nicotine dependence, other tobacco product, uncomplicated: Secondary | ICD-10-CM

## 2021-02-08 DIAGNOSIS — E1142 Type 2 diabetes mellitus with diabetic polyneuropathy: Secondary | ICD-10-CM | POA: Diagnosis not present

## 2021-02-08 NOTE — Progress Notes (Signed)
LENNOX, LEIKAM ([id number]) Visit Report for 02/08/2021 Arrival Information Details Patient Name: Date of Service: [name], [name] 02/08/2021 8:45 A M Medical Record Number: [id number] Patient Account Number: [id number] Date of Birth/Sex: Treating RN: 1955/09/01 (65 y.o. [name] Primary Care Lakenya Riendeau: [name] Other Clinician: Referring Aniceto Kyser: Treating Kinslei Labine/Extender: [name] in Treatment: 7 Visit Information History Since Last Visit Added or deleted any medications: No Patient Arrived: Ambulatory Any new allergies or adverse reactions: No Arrival Time: 09:10 Had a fall or experienced change in No Accompanied By: self activities of daily living that may affect Transfer Assistance: None risk of falls: Patient Identification Verified: Yes Signs or symptoms of abuse/neglect since last visito No Secondary Verification Process Completed: Yes Hospitalized since last visit: No Patient Requires Transmission-Based Precautions: No Implantable device outside of the clinic excluding No Patient Has Alerts: No cellular tissue based products placed in the center since last visit: Has Dressing in Place as Prescribed: Yes Pain Present Now: No Electronic Signature(s) Signed: 02/08/2021 10:15:56 AM By: Sandre Kitty Entered By: Sandre Kitty on 02/08/2021 09:11:02 -------------------------------------------------------------------------------- Clinic Level of Care Assessment Details Patient Name: Date of Service: [name], [name] 02/08/2021 8:45 A M Medical Record Number: [id number] Patient Account Number: [id number] Date of Birth/Sex: Treating RN: 03/25/1955 (65 y.o. [name], [name] Primary Care Zeynep Fantroy: [name] Other Clinician: Referring Alixandrea Milleson: Treating Jacoya Bauman/Extender: [name] in Treatment: 7 Clinic Level of Care Assessment Items TOOL 4 Quantity  Score X- 1 0 Use when only an EandM is performed on FOLLOW-UP visit ASSESSMENTS - Nursing Assessment / Reassessment X- 1 10 Reassessment of Co-morbidities (includes updates in patient status) X- 1 5 Reassessment of Adherence to Treatment Plan ASSESSMENTS - Wound and Skin A ssessment / Reassessment _0  - 0 Simple Wound Assessment / Reassessment - one wound X- 2 5 Complex Wound Assessment / Reassessment - multiple wounds X- 1 10 Dermatologic / Skin Assessment (not related to wound area) ASSESSMENTS - Focused Assessment X- 1 5 Circumferential Edema Measurements - multi extremities _1  - 0 Nutritional Assessment / Counseling / Intervention _2  - 0 Lower Extremity Assessment (monofilament, tuning fork, pulses) _3  - 0 Peripheral Arterial Disease Assessment (using hand held doppler) ASSESSMENTS - Ostomy and/or Continence Assessment and Care _4  - 0 Incontinence Assessment and Management _5  - 0 Ostomy Care Assessment and Management (repouching, etc.) PROCESS - Coordination of Care _6  - 0 Simple Patient / Family Education for ongoing care X- 1 20 Complex (extensive) Patient / Family Education for ongoing care X- 1 10 Staff obtains Programmer, systems, Records, T Results / Process Orders est X- 1 10 Staff telephones HHA, Nursing Homes / Clarify orders / etc _7  - 0 Routine Transfer to another Facility (non-emergent condition) _8  - 0 Routine Hospital Admission (non-emergent condition) _9  - 0 New Admissions / Biomedical engineer / Ordering NPWT Apligraf, etc. , _10  - 0 Emergency Hospital Admission (emergent condition) _11  - 0 Simple Discharge Coordination X- 1 15 Complex (extensive) Discharge Coordination PROCESS - Special Needs _12  - 0 Pediatric / Minor Patient Management _13  - 0 Isolation Patient Management _14  - 0 Hearing / Language / Visual special needs _15  - 0 Assessment of Community assistance (transportation, D/C planning, etc.) _16  - 0 Additional assistance / Altered  mentation _17  - 0 Support Surface(s) Assessment (bed, cushion, seat, etc.) INTERVENTIONS - Wound Cleansing / Measurement _18  - 0 Simple Wound Cleansing - one wound X- 2 5 Complex  Wound Cleansing - multiple wounds X- 1 5 Wound Imaging (photographs - any number of wounds) _0  - 0 Wound Tracing (instead of photographs) _1  - 0 Simple Wound Measurement - one wound X- 2 5 Complex Wound Measurement - multiple wounds INTERVENTIONS - Wound Dressings X - Small Wound Dressing one or multiple wounds 1 10 _2  - 0 Medium Wound Dressing one or multiple wounds _3  - 0 Large Wound Dressing one or multiple wounds <ZOXWRUEAVWUJWJXB>_1<\/YNWGNFAOZHYQMVHQ>_4  - 0 Application of Medications - topical <ONGEXBMWUXLKGMWN>_0<\/UVOZDGUYQIHKVQQV>_9  - 0 Application of Medications - injection INTERVENTIONS - Miscellaneous _6  - 0 External ear exam _7  - 0 Specimen Collection (cultures, biopsies, blood, body fluids, etc.) _8  - 0 Specimen(s) / Culture(s) sent or taken to Lab for analysis _9  - 0 Patient Transfer (multiple staff / Civil Service fast streamer / Similar devices) _10  - 0 Simple Staple / Suture removal (25 or less) _11  - 0 Complex Staple / Suture removal (26 or more) _12  - 0 Hypo / Hyperglycemic Management (close monitor of Blood Glucose) _13  - 0 Ankle / Brachial Index (ABI) - do not check if billed separately X- 1 5 Vital Signs Has the patient been seen at the hospital within the last three years: Yes Total Score: 135 Level Of Care: New/Established - Level 4 Electronic Signature(s) Signed: 02/08/2021 5:04:55 PM By: Deon Pilling RN, BSN Entered By: Deon Pilling on 02/08/2021 10:44:27 -------------------------------------------------------------------------------- Encounter Discharge Information Details Patient Name: Date of Service: [name], [name] 02/08/2021 8:45 A M Medical Record Number: [id number] Patient Account Number: [id number] Date of Birth/Sex: Treating RN: 01-02-1956 (65 y.o. [name] Primary Care Dravon Nott: [name] Other Clinician: Referring  Khamari Sheehan: Treating Marilla Boddy/Extender: [name] in Treatment: 7 Encounter Discharge Information Items Discharge Condition: Stable Ambulatory Status: Ambulatory Discharge Destination: Home Transportation: Private Auto Accompanied By: self Schedule Follow-up Appointment: Yes Clinical Summary of Care: Notes ped assist in surgical shoe. foam padding to closed area to right first met head. Electronic Signature(s) Signed: 02/08/2021 5:04:55 PM By: Deon Pilling RN, BSN Entered By: Deon Pilling on 02/08/2021 10:45:26 -------------------------------------------------------------------------------- Lower Extremity Assessment Details Patient Name: Date of Service: [name], [name] 02/08/2021 8:45 A M Medical Record Number: [id number] Patient Account Number: [id number] Date of Birth/Sex: Treating RN: [birth year] (65 y.o. [name], [name] Primary Care Montavius Subramaniam: [name] Other Clinician: Referring Raina Sole: Treating Floraine Buechler/Extender: [name] in Treatment: 7 Edema Assessment Assessed: [name]: No] [Right: Yes] Edema: [Left: Ye] [Right: s] Calf Left: Right: Point of Measurement: 32 cm From Medial Instep 45 cm Ankle Left: Right: Point of Measurement: 8 cm From Medial Instep 25 cm Vascular Assessment Pulses: Dorsalis Pedis Palpable: [Right:Yes] Electronic Signature(s) Signed: 02/08/2021 5:04:55 PM By: Deon Pilling RN, BSN Entered By: Deon Pilling on 02/08/2021 09:13:34 -------------------------------------------------------------------------------- Multi Wound Chart Details Patient Name: Date of Service: [name], [name] 02/08/2021 8:45 A M Medical Record Number: [id number] Patient Account Number: [id number] Date of Birth/Sex: Treating RN: 1955-06-25 (65 y.o. [name] Primary Care Filip Luten: [name] Other Clinician: Referring Natha Guin: Treating Tykera Skates/Extender:  [name] in Treatment: 7 Vital Signs Height(in): 1 Pulse(bpm): 54 Weight(lbs): 277 Blood Pressure(mmHg): 156/72 Body Mass Index(BMI): 43 Temperature(F): 97.6 Respiratory Rate(breaths/min): 18 Photos: [1:No Photos Right Metatarsal head first] [3:No Photos Right, Lateral Foot] [N/A:N/A N/A] Wound Location: [1:Gradually Appeared] [3:Shear/Friction] [N/A:N/A] Wounding Event: [1:Diabetic Wound/Ulcer of the Lower] [3:Diabetic Wound/Ulcer of the Lower] [N/A:N/A] Primary Etiology: [1:Extremity Sleep Apnea, Coronary  Artery] [3:Extremity N/A] [N/A:N/A] Comorbid History: [1:Disease, Hypertension, Type II Diabetes, Neuropathy 10/29/2020] [3:01/11/2021] [N/A:N/A] Date Acquired: [1:7] [3:4] [N/A:N/A] Weeks of Treatment: [1:Healed - Epithelialized] [3:Healed - Epithelialized] [N/A:N/A] Wound Status: [1:0x0x0] [3:0x0x0] [N/A:N/A] Measurements L x W x D (cm) [1:0] [3:0] [N/A:N/A] A (cm) : rea [1:0] [3:0] [N/A:N/A] Volume (cm) : [1:100.00%] [3:100.00%] [N/A:N/A] % Reduction in A rea: [1:100.00%] [3:100.00%] [N/A:N/A] % Reduction in Volume: [1:Grade 1] [3:Grade 1] [N/A:N/A] Classification: [1:None Present] [3:Medium] [N/A:N/A] Exudate A mount: [1:N/A] [3:Serosanguineous] [N/A:N/A] Exudate Type: [1:N/A] [3:red, brown] [N/A:N/A] Exudate Color: [1:Distinct, outline attached] [3:N/A] [N/A:N/A] Wound Margin: [1:None Present (0%)] [3:N/A] [N/A:N/A] Granulation A mount: [1:Large (67-100%)] [3:N/A] [N/A:N/A] Necrotic A mount: [1:Eschar] [3:N/A] [N/A:N/A] Necrotic Tissue: [1:Fat Layer (Subcutaneous Tissue): Yes N/A] [N/A:N/A] Exposed Structures: [1:Fascia: No Tendon: No Muscle: No Joint: No Bone: No Large (67-100%)] [3:N/A] [N/A:N/A] Treatment Notes Electronic Signature(s) Signed: 02/08/2021 10:47:19 AM By: Kalman Shan DO Signed: 02/08/2021 3:53:37 PM By: Lorrin Jackson Entered By: Kalman Shan on 02/08/2021  10:41:31 -------------------------------------------------------------------------------- Multi-Disciplinary Care Plan Details Patient Name: Date of Service: [name], [name] 02/08/2021 8:45 A M Medical Record Number: [id number] Patient Account Number: [id number] Date of Birth/Sex: Treating RN: Feb 12, 1956 (65 y.o. [name], [name] Primary Care Helmuth Recupero: [name] Other Clinician: Referring Aariel Ems: Treating Jahliyah Trice/Extender: [name] in Treatment: 7 Active Inactive Nutrition Nursing Diagnoses: Impaired glucose control: actual or potential Goals: Patient/caregiver verbalizes understanding of need to maintain therapeutic glucose control per primary care physician Date Initiated: 12/17/2020 Target Resolution Date: 03/01/2021 Goal Status: Active Interventions: Assess HgA1c results as ordered upon admission and as needed Assess patient nutrition upon admission and as needed per policy Provide education on elevated blood sugars and impact on wound healing Notes: 02/01/21: Glucose control ongoing Wound/Skin Impairment Nursing Diagnoses: Impaired tissue integrity Goals: Patient/caregiver will verbalize understanding of skin care regimen Date Initiated: 12/17/2020 Target Resolution Date: 03/01/2021 Goal Status: Active Ulcer/skin breakdown will have a volume reduction of 30% by week 4 Date Initiated: 12/17/2020 Date Inactivated: 02/01/2021 Target Resolution Date: 01/21/2021 Goal Status: Met Ulcer/skin breakdown will have a volume reduction of 50% by week 8 Date Initiated: 02/01/2021 Date Inactivated: 02/08/2021 Target Resolution Date: 03/01/2021 Goal Status: Met Interventions: Assess patient/caregiver ability to obtain necessary supplies Assess patient/caregiver ability to perform ulcer/skin care regimen upon admission and as needed Assess ulceration(s) every visit Provide education on ulcer and skin care Treatment  Activities: Topical wound management initiated : 12/17/2020 Notes: Electronic Signature(s) Signed: 02/08/2021 5:04:55 PM By: Deon Pilling RN, BSN Entered By: Deon Pilling on 02/08/2021 09:33:26 -------------------------------------------------------------------------------- Pain Assessment Details Patient Name: Date of Service: [name], [name] 02/08/2021 8:45 A M Medical Record Number: [id number] Patient Account Number: [id number] Date of Birth/Sex: Treating RN: 04/01/1955 (66 y.o. [name] Primary Care Derotha Fishbaugh: [name] Other Clinician: Referring Denissa Cozart: Treating Dierdre Mccalip/Extender: [name] in Treatment: 7 Active Problems Location of Pain Severity and Description of Pain Patient Has Paino No Site Locations Pain Management and Medication Current Pain Management: Electronic Signature(s) Signed: 02/08/2021 10:15:56 AM By: Sandre Kitty Signed: 02/08/2021 3:53:37 PM By: Lorrin Jackson Entered By: Sandre Kitty on 02/08/2021 09:11:33 -------------------------------------------------------------------------------- Patient/Caregiver Education Details Patient Name: Date of Service: [name], [name] 12/19/2022andnbsp8:45 A M Medical Record Number: [id number] Patient Account Number: [id number] Date of Birth/Gender: Treating RN: January 01, 1956 (65 y.o. [name], [name] Primary Care Physician: [name] Other Clinician: Referring Physician: Treating Physician/Extender: [name]  in Treatment: 7 Education Assessment Education Provided To: Patient and Caregiver Education Topics Provided Elevated Blood Sugar/ Impact on Healing: Handouts: Elevated Blood Sugars: How Do They Affect Wound Healing Methods: Explain/Verbal Responses: Reinforcements needed Electronic Signature(s) Signed: 02/08/2021 5:04:55 PM By: Deon Pilling RN, BSN Entered By: Deon Pilling on  02/08/2021 09:16:29 -------------------------------------------------------------------------------- Wound Assessment Details Patient Name: Date of Service: [name], [name] 02/08/2021 8:45 A M Medical Record Number: [id number] Patient Account Number: [id number] Date of Birth/Sex: Treating RN: [birth year] (65 y.o. [name], [name] Primary Care Marlet Korte: [name] Other Clinician: Referring Asta Corbridge: Treating Abron Neddo/Extender: [name] in Treatment: 7 Wound Status Wound Number: 1 Primary Diabetic Wound/Ulcer of the Lower Extremity Etiology: Wound Location: Right Metatarsal head first Wound Healed - Epithelialized Wounding Event: Gradually Appeared Status: Date Acquired: 10/29/2020 Comorbid Sleep Apnea, Coronary Artery Disease, Hypertension, Type II Weeks Of Treatment: 7 History: Diabetes, Neuropathy Clustered Wound: No Wound Measurements Length: (cm) Width: (cm) Depth: (cm) Area: (cm) Volume: (cm) 0 % Reduction in Area: 100% 0 % Reduction in Volume: 100% 0 Epithelialization: Large (67-100%) 0 Tunneling: No 0 Undermining: No Wound Description Classification: Grade 1 Wound Margin: Distinct, outline attached Exudate Amount: None Present Foul Odor After Cleansing: No Slough/Fibrino No Wound Bed Granulation Amount: None Present (0%) Exposed Structure Necrotic Amount: Large (67-100%) Fascia Exposed: No Necrotic Quality: Eschar Fat Layer (Subcutaneous Tissue) Exposed: Yes Tendon Exposed: No Muscle Exposed: No Joint Exposed: No Bone Exposed: No Electronic Signature(s) Signed: 02/08/2021 5:04:55 PM By: Deon Pilling RN, BSN Entered By: Deon Pilling on 02/08/2021 09:33:02 -------------------------------------------------------------------------------- Wound Assessment Details Patient Name: Date of Service: [name], [name] 02/08/2021 8:45 A M Medical Record Number: [id number] Patient Account Number:  [id number] Date of Birth/Sex: Treating RN: 09-02-1955 (65 y.o. [name], [name] Primary Care Kyarra Vancamp: [name] Other Clinician: Referring Angelito Hopping: Treating Prince Couey/Extender: [name] in Treatment: 7 Wound Status Wound Number: 3 Primary Etiology: Diabetic Wound/Ulcer of the Lower Extremity Wound Location: Right, Lateral Foot Wound Status: Healed - Epithelialized Wounding Event: Shear/Friction Date Acquired: 01/11/2021 Weeks Of Treatment: 4 Clustered Wound: No Wound Measurements Length: (cm) 0 Width: (cm) 0 Depth: (cm) 0 Area: (cm) 0 Volume: (cm) 0 % Reduction in Area: 100% % Reduction in Volume: 100% Wound Description Classification: Grade 1 Exudate Amount: Medium Exudate Type: Serosanguineous Exudate Color: red, brown Electronic Signature(s) Signed: 02/08/2021 5:04:55 PM By: Deon Pilling RN, BSN Entered By: Deon Pilling on 02/08/2021 09:15:15 -------------------------------------------------------------------------------- Vitals Details Patient Name: Date of Service: [name], [name] 02/08/2021 8:45 A M Medical Record Number: [id number] Patient Account Number: [id number] Date of Birth/Sex: Treating RN: 09-Jul-1955 (65 y.o. [name] Primary Care Makeba Delcastillo: [name] Other Clinician: Referring Norissa Bartee: Treating Robertlee Rogacki/Extender: [name] in Treatment: 7 Vital Signs Time Taken: 09:11 Temperature (F): 97.6 Height (in): 67 Pulse (bpm): 71 Weight (lbs): 277 Respiratory Rate (breaths/min): 18 Body Mass Index (BMI): 43.4 Blood Pressure (mmHg): 156/72 Reference Range: 80 - 120 mg / dl Electronic Signature(s) Signed: 02/08/2021 10:15:56 AM By: Sandre Kitty Entered By: Sandre Kitty on 02/08/2021 09:11:22

## 2021-02-08 NOTE — Progress Notes (Signed)
[name], [name] ([id number]) Visit Report for 02/08/2021 Chief Complaint Document Details Patient Name: Date of Service: [name], [name] 02/08/2021 8:45 [name] M Medical Record Number: [id number] Patient Account Number: [id number] Date of Birth/Sex: Treating RN: [birth year] (65 y.o. [name] Primary Care Provider: [name] Other Clinician: Referring Provider: Treating Provider/Extender: [name] in Treatment: 7 Information Obtained from: Patient Chief Complaint 10/27; Right Plantar foot wound 11/21; 2 small areas of skin breakdown to the right foot following cast placement Electronic Signature(s) Signed: 02/08/2021 10:47:19 AM By: Kalman Shan DO Entered By: Kalman Shan on 02/08/2021 10:41:51 -------------------------------------------------------------------------------- HPI Details Patient Name: Date of Service: [name], [name] 02/08/2021 8:45 [name] M Medical Record Number: [id number] Patient Account Number: [id number] Date of Birth/Sex: Treating RN: [birth year] (65 y.o. [name] Primary Care Provider: [name] Other Clinician: Referring Provider: Treating Provider/Extender: [name] in Treatment: 7 History of Present Illness HPI Description: Admission 12/17/2020 [name] [name] 65 year old female with [name] past medical history of insulin-dependent type 2 diabetes, hypothyroidism and daily1 pack per day cigarette smoker the presents to the clinic for [name] 6-week history of nonhealing wound to the right first MTPJ. She has been following with [name], podiatry for this issue. She has been using silver alginate with dressing changes. She uses [name] postsurgical [name] and offloading pads. She currently denies signs of infection. 10/31; patient presents for follow-up. She tolerated the soft cast fine although she states that she felt her foot rolling to one  side. She denies signs of infection. She would like to do the total contact cast today. 12/23/2020 upon evaluation today patient appears to be doing excellent in regard to her wound on the foot and she is in [name] total contact cast. I do think this is appropriate this is the first cast change which we are obliged to do to ensure nothing is rubbing everything appears to be doing quite well and very pleased in that regard. 11/7; patient presents for follow-up. She had no issues with the cast. She denies signs of infection. 11/14; patient presents for 1 week follow-up. She has had no issues with the cast. She denies signs of infection. 11/21; patient presents for 1 week follow-up. She did develop 2 small areas of skin breakdown on either side of the right foot from the cast rubbing. She states she did not feel the wounds developing. She currently denies signs of infection. 11/28; patient presents for 1 week follow-up. She has no issues or complaints today. She denies pain or acute signs of infection. 12/12; patient presents for follow-up. She reports improvement to her right lateral foot wound. She has been using silver alginate to the area. She has no issues or complaints today. 12/19; patient presents for follow-up. She reports that her right lateral foot wound has healed. She has no issues or complaints today. Electronic Signature(s) Signed: 02/08/2021 10:47:19 AM By: Kalman Shan DO Entered By: Kalman Shan on 02/08/2021 10:42:38 -------------------------------------------------------------------------------- Physical Exam Details Patient Name: Date of Service: [name], [name] 02/08/2021 8:45 [name] M Medical Record Number: [id number] Patient Account Number: [id number] Date of Birth/Sex: Treating RN: Jan 02, 1956 (65 y.o. [name] Primary Care Provider: [name] Other Clinician: Referring Provider: Treating Provider/Extender: [name] in Treatment: 7 Constitutional respirations regular, non-labored and within target range for patient.. Cardiovascular 2+ dorsalis pedis/posterior tibialis pulses. Psychiatric pleasant  and cooperative. Notes Right lateral foot has epithelization to the previous wound site. T the right first met head there is callus to be at the previous wound site. No open wound or o drainage noted. Electronic Signature(s) Signed: 02/08/2021 10:47:19 AM By: Kalman Shan DO Entered By: Kalman Shan on 02/08/2021 10:43:42 -------------------------------------------------------------------------------- Physician Orders Details Patient Name: Date of Service: [name], [name] 02/08/2021 8:45 [name] M Medical Record Number: [id number] Patient Account Number: [id number] Date of Birth/Sex: Treating RN: 1955-03-27 (65 y.o. [name], [name] Primary Care Provider: [name] Other Clinician: Referring Provider: Treating Provider/Extender: [name] in Treatment: 7 Verbal / Phone Orders: No Diagnosis Coding ICD-10 Coding Code Description E11.621 Type 2 diabetes mellitus with foot ulcer L97.512 Non-pressure 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 E11.42 Type 2 diabetes mellitus with diabetic polyneuropathy F17.290 Nicotine dependence, other tobacco product, uncomplicated C12.751 Non-pressure chronic ulcer of other part of right foot limited to breakdown of skin Follow-up Appointments ppointment in 2 weeks. - Dr. Heber [place] Return [name] Bathing/ Shower/ Hygiene May shower and wash wound with soap and water. Edema Control - Lymphedema / SCD / Other Elevate legs to the level of the heart or above for 30 minutes daily and/or when sitting, [name] frequency of: - throughout the day. Avoid standing for long periods of time. Exercise regularly Moisturize legs daily. - and feet daily Off-Loading Open toe surgical [name] to: - with PEG ASSIST  in [name]. Wear walking and standing. NO BAREFEET. PAD the closed area for protection. Check feet daily. Electronic Signature(s) Signed: 02/08/2021 10:47:19 AM By: Kalman Shan DO Entered By: Kalman Shan on 02/08/2021 10:44:09 -------------------------------------------------------------------------------- Problem List Details Patient Name: Date of Service: [name], [name] 02/08/2021 8:45 [name] M Medical Record Number: [id number] Patient Account Number: [id number] Date of Birth/Sex: Treating RN: [birth year] (65 y.o. [name], [name] Primary Care Provider: [name] Other Clinician: Referring Provider: Treating Provider/Extender: [name] in Treatment: 7 Active Problems ICD-10 Encounter Code Description Active Date MDM Diagnosis E11.621 Type 2 diabetes mellitus with foot ulcer 12/17/2020 No Yes L97.512 Non-pressure 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 12/21/2020 No Yes E11.42 Type 2 diabetes mellitus with diabetic polyneuropathy 12/17/2020 No Yes F17.290 Nicotine dependence, other tobacco product, uncomplicated 96/75/9163 No Yes L97.511 Non-pressure chronic ulcer of other part of right foot limited to breakdown of 01/11/2021 No Yes skin Inactive Problems Resolved Problems Electronic Signature(s) Signed: 02/08/2021 10:47:19 AM By: Kalman Shan DO Entered By: Kalman Shan on 02/08/2021 10:41:18 -------------------------------------------------------------------------------- Progress Note Details Patient Name: Date of Service: Lenora, [name] 02/08/2021 8:45 [name] M Medical Record Number: [id number] Patient Account Number: [id number] Date of Birth/Sex: Treating RN: 08-05-1955 (65 y.o. [name] Primary Care Provider: [name] Other Clinician: Referring Provider: Treating Provider/Extender: [name] in Treatment: 7 Subjective Chief  Complaint Information obtained from Patient 10/27; Right Plantar foot wound 11/21; 2 small areas of skin breakdown to the right foot following cast placement History of Present Illness (HPI) Admission 12/17/2020 Ms. Ilisha Blust is [name] 65 year old female with [name] past medical history of insulin-dependent type 2 diabetes, hypothyroidism and daily1 pack per day cigarette smoker the presents to the clinic for [name] 6-week history of nonhealing wound to the right first MTPJ. She has been following with [name], podiatry for this issue. She has been using silver alginate  with dressing changes. She uses [name] postsurgical [name] and offloading pads. She currently denies signs of infection. 10/31; patient presents for follow-up. She tolerated the soft cast fine although she states that she felt her foot rolling to one side. She denies signs of infection. She would like to do the total contact cast today. 12/23/2020 upon evaluation today patient appears to be doing excellent in regard to her wound on the foot and she is in [name] total contact cast. I do think this is appropriate this is the first cast change which we are obliged to do to ensure nothing is rubbing everything appears to be doing quite well and very pleased in that regard. 11/7; patient presents for follow-up. She had no issues with the cast. She denies signs of infection. 11/14; patient presents for 1 week follow-up. She has had no issues with the cast. She denies signs of infection. 11/21; patient presents for 1 week follow-up. She did develop 2 small areas of skin breakdown on either side of the right foot from the cast rubbing. She states she did not feel the wounds developing. She currently denies signs of infection. 11/28; patient presents for 1 week follow-up. She has no issues or complaints today. She denies pain or acute signs of infection. 12/12; patient presents for follow-up. She reports improvement to her right lateral foot wound. She has  been using silver alginate to the area. She has no issues or complaints today. 12/19; patient presents for follow-up. She reports that her right lateral foot wound has healed. She has no issues or complaints today. Patient History Information obtained from Patient, Chart. Family History Unknown History. Social History Current every day smoker - 1 pack/[name]/day, Alcohol Use - Never, Drug Use - No History, Caffeine Use - Moderate. Medical History Eyes Denies history of Cataracts, Glaucoma, Optic Neuritis Ear/Nose/Mouth/Throat Denies history of Chronic sinus problems/congestion, Middle ear problems Hematologic/Lymphatic Denies history of Anemia, Hemophilia, Human Immunodeficiency Virus, Lymphedema, Sickle Cell Disease Respiratory Patient has history of Sleep Apnea Denies history of Aspiration, Asthma, Chronic Obstructive Pulmonary Disease (COPD), Pneumothorax, Tuberculosis Cardiovascular Patient has history of Coronary Artery Disease, Hypertension Denies history of Angina, Arrhythmia, Congestive Heart Failure, Deep Vein Thrombosis, Hypotension, Myocardial Infarction, Peripheral Arterial Disease, Peripheral Venous Disease, Phlebitis, Vasculitis Gastrointestinal Denies history of Cirrhosis , Colitis, Crohnoos, Hepatitis [name], Hepatitis B, Hepatitis C Endocrine Patient has history of Type II Diabetes Denies history of Type I Diabetes Genitourinary Denies history of End Stage Renal Disease Immunological Denies history of Lupus Erythematosus, Raynaudoos, Scleroderma Integumentary (Skin) Denies history of History of Burn Musculoskeletal Denies history of Gout, Rheumatoid Arthritis, Osteoarthritis, Osteomyelitis Neurologic Patient has history of Neuropathy Denies history of Dementia, Quadriplegia, Paraplegia, Seizure Disorder Hospitalization/Surgery History - 2nd and 3rd right toe amps. - total hysterectomy. - cholecystectomy. - 2 knee surgery's on left and 1 on right. Medical [name] Surgical  History Notes nd Cardiovascular hypercholesterolemia Objective Constitutional respirations regular, non-labored and within target range for patient.. Vitals Time Taken: 9:11 AM, Height: 67 in, Weight: 277 lbs, BMI: 43.4, Temperature: 97.6 F, Pulse: 71 bpm, Respiratory Rate: 18 breaths/min, Blood Pressure: 156/72 mmHg. Cardiovascular 2+ dorsalis pedis/posterior tibialis pulses. Psychiatric pleasant and cooperative. General Notes: Right lateral foot has epithelization to the previous wound site. T the right first met head there is callus to be at the previous wound site. No o open wound or drainage noted. Integumentary (Hair, Skin) Wound #1 status is Healed - Epithelialized. Original cause of wound was Gradually Appeared. The date acquired was: 10/29/2020.  The wound has been in treatment 7 weeks. The wound is located on the Right Metatarsal head first. The wound measures 0cm length x 0cm width x 0cm depth; 0cm^2 area and 0cm^3 volume. There is Fat Layer (Subcutaneous Tissue) exposed. There is no tunneling or undermining noted. There is [name] none present amount of drainage noted. The wound margin is distinct with the outline attached to the wound base. There is no granulation within the wound bed. There is [name] large (67-100%) amount of necrotic tissue within the wound bed including Eschar. Wound #3 status is Healed - Epithelialized. Original cause of wound was Shear/Friction. The date acquired was: 01/11/2021. The wound has been in treatment 4 weeks. The wound is located on the Right,Lateral Foot. The wound measures 0cm length x 0cm width x 0cm depth; 0cm^2 area and 0cm^3 volume. There is [name] medium amount of serosanguineous drainage noted. Assessment Active Problems ICD-10 Type 2 diabetes mellitus with foot ulcer Non-pressure 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 Type 2 diabetes mellitus with diabetic polyneuropathy Nicotine dependence, other tobacco product,  uncomplicated Non-pressure chronic ulcer of other part of right foot limited to breakdown of skin Patient's right lateral wound has healed. The right first met head plantar wound is still closed. We gave her [name] Pegasys insert to help with offloading. At this time I would like for her to follow-up in 2 weeks to assure that all wounds have remained closed. Plan Follow-up Appointments: Return Appointment in 2 weeks. - Dr. Heber [place] Bathing/ Shower/ Hygiene: May shower and wash wound with soap and water. Edema Control - Lymphedema / SCD / Other: Elevate legs to the level of the heart or above for 30 minutes daily and/or when sitting, [name] frequency of: - throughout the day. Avoid standing for long periods of time. Exercise regularly Moisturize legs daily. - and feet daily Off-Loading: Open toe surgical [name] to: - with PEG ASSIST in [name]. Wear walking and standing. NO BAREFEET PAD the closed area for protection. Check feet daily. . 1. Pegasys insert for offloading 2. Follow-up in 2 weeks Electronic Signature(s) Signed: 02/08/2021 10:47:19 AM By: Kalman Shan DO Entered By: Kalman Shan on 02/08/2021 10:45:37 -------------------------------------------------------------------------------- HxROS Details Patient Name: Date of Service: [name], [name] 02/08/2021 8:45 [name] M Medical Record Number: [id number] Patient Account Number: [id number] Date of Birth/Sex: Treating RN: 1955-10-16 (65 y.o. [name] Primary Care Provider: [name] Other Clinician: Referring Provider: Treating Provider/Extender: [name] in Treatment: 7 Information Obtained From Patient Chart Eyes Medical History: Negative for: Cataracts; Glaucoma; Optic Neuritis Ear/Nose/Mouth/Throat Medical History: Negative for: Chronic sinus problems/congestion; Middle ear problems Hematologic/Lymphatic Medical History: Negative for: Anemia; Hemophilia; Human  Immunodeficiency Virus; Lymphedema; Sickle Cell Disease Respiratory Medical History: Positive for: Sleep Apnea Negative for: Aspiration; Asthma; Chronic Obstructive Pulmonary Disease (COPD); Pneumothorax; Tuberculosis Cardiovascular Medical History: Positive for: Coronary Artery Disease; Hypertension Negative for: Angina; Arrhythmia; Congestive Heart Failure; Deep Vein Thrombosis; Hypotension; Myocardial Infarction; Peripheral Arterial Disease; Peripheral Venous Disease; Phlebitis; Vasculitis Past Medical History Notes: hypercholesterolemia Gastrointestinal Medical History: Negative for: Cirrhosis ; Colitis; Crohns; Hepatitis [name]; Hepatitis B; Hepatitis C Endocrine Medical History: Positive for: Type II Diabetes Negative for: Type I Diabetes Genitourinary Medical History: Negative for: End Stage Renal Disease Immunological Medical History: Negative for: Lupus Erythematosus; Raynauds; Scleroderma Integumentary (Skin) Medical History: Negative for: History of Burn Musculoskeletal Medical History: Negative for: Gout; Rheumatoid Arthritis; Osteoarthritis; Osteomyelitis Neurologic Medical History: Positive for: Neuropathy Negative  for: Dementia; Quadriplegia; Paraplegia; Seizure Disorder Immunizations Pneumococcal Vaccine: Received Pneumococcal Vaccination: Yes Received Pneumococcal Vaccination On or After 60th Birthday: Yes Tetanus Vaccine: Last tetanus shot: 12/17/2020 Implantable Devices No devices added Hospitalization / Surgery History Type of Hospitalization/Surgery 2nd and 3rd right toe amps total hysterectomy cholecystectomy 2 knee surgery's on left and 1 on right Family and Social History Unknown History: Yes; Current every day smoker - 1 pack/[name]/day; Alcohol Use: Never; Drug Use: No History; Caffeine Use: Moderate; Financial Concerns: No; Food, Clothing or Shelter Needs: No; Support System Lacking: No; Transportation Concerns: No Electronic Signature(s) Signed:  02/08/2021 10:47:19 AM By: Kalman Shan DO Signed: 02/08/2021 3:53:37 PM By: Lorrin Jackson Entered By: Kalman Shan on 02/08/2021 10:42:50 -------------------------------------------------------------------------------- SuperBill Details Patient Name: Date of Service: [name], [name] 02/08/2021 Medical Record Number: [id number] Patient Account Number: [id number] Date of Birth/Sex: Treating RN: 01/06/1956 (65 y.o. [name], [name] Primary Care Provider: [name] Other Clinician: Referring Provider: Treating Provider/Extender: [name] in Treatment: 7 Diagnosis Coding ICD-10 Codes Code Description E11.621 Type 2 diabetes mellitus with foot ulcer L97.512 Non-pressure 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 E11.42 Type 2 diabetes mellitus with diabetic polyneuropathy F17.290 Nicotine dependence, other tobacco product, uncomplicated R94.585 Non-pressure chronic ulcer of other part of right foot limited to breakdown of skin Facility Procedures CPT4 Code: [id number] Description: 99214 - WOUND CARE VISIT-LEV 4 EST PT Modifier: Quantity: 1 Physician Procedures : CPT4 Code Description Modifier [id number] - WC PHYS LEVEL 3 - EST PT ICD-10 Diagnosis Description E11.621 Type 2 diabetes mellitus with foot ulcer L97.512 Non-pressure 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 E11.42 Type 2  diabetes mellitus with diabetic polyneuropathy F17.290 Nicotine dependence, other tobacco product, uncomplicated Quantity: 1 Electronic Signature(s) Signed: 02/08/2021 10:47:19 AM By: Kalman Shan DO Entered By: Kalman Shan on 02/08/2021 10:46:52

## 2021-02-23 ENCOUNTER — Encounter (HOSPITAL_BASED_OUTPATIENT_CLINIC_OR_DEPARTMENT_OTHER): Payer: Medicare Other | Attending: Internal Medicine | Admitting: Internal Medicine

## 2021-02-23 ENCOUNTER — Other Ambulatory Visit: Payer: Self-pay

## 2021-02-23 DIAGNOSIS — Z01419 Encounter for gynecological examination (general) (routine) without abnormal findings: Secondary | ICD-10-CM | POA: Diagnosis not present

## 2021-02-23 DIAGNOSIS — E11621 Type 2 diabetes mellitus with foot ulcer: Secondary | ICD-10-CM | POA: Diagnosis not present

## 2021-02-23 DIAGNOSIS — L97511 Non-pressure chronic ulcer of other part of right foot limited to breakdown of skin: Secondary | ICD-10-CM | POA: Insufficient documentation

## 2021-02-23 DIAGNOSIS — E1142 Type 2 diabetes mellitus with diabetic polyneuropathy: Secondary | ICD-10-CM | POA: Insufficient documentation

## 2021-02-23 DIAGNOSIS — L97512 Non-pressure 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: Secondary | ICD-10-CM | POA: Diagnosis not present

## 2021-02-23 DIAGNOSIS — F17210 Nicotine dependence, cigarettes, uncomplicated: Secondary | ICD-10-CM | POA: Diagnosis not present

## 2021-02-23 DIAGNOSIS — F17290 Nicotine dependence, other tobacco product, uncomplicated: Secondary | ICD-10-CM | POA: Diagnosis not present

## 2021-02-23 DIAGNOSIS — Z6841 Body Mass Index (BMI) 40.0 and over, adult: Secondary | ICD-10-CM | POA: Diagnosis not present

## 2021-02-23 DIAGNOSIS — N952 Postmenopausal atrophic vaginitis: Secondary | ICD-10-CM | POA: Diagnosis not present

## 2021-02-23 NOTE — Progress Notes (Signed)
[name], [name] ([id number]) Visit Report for 02/23/2021 Chief Complaint Document Details Patient Name: Date of Service: [name] [name], [name] 02/23/2021 9:00 A M Medical Record Number: [id number] Patient Account Number: [id number] Date of Birth/Sex: Treating RN: [birth year] (66 y.o. F) Primary Care Provider: [name] Other Clinician: Referring Provider: Treating Provider/Extender: [name] in Treatment: 9 Information Obtained from: Patient Chief Complaint 10/27; Right Plantar foot wound 11/21; 2 small areas of skin breakdown to the right foot following cast placement Electronic Signature(s) Signed: 02/23/2021 10:30:32 AM By: Kalman Shan DO Entered By: Kalman Shan on 02/23/2021 10:25:38 -------------------------------------------------------------------------------- HPI Details Patient Name: Date of Service: [name] [name], [name] 02/23/2021 9:00 A M Medical Record Number: [id number] Patient Account Number: [id number] Date of Birth/Sex: Treating RN: November 01, 1955 (66 y.o. F) Primary Care Provider: [name] Other Clinician: Referring Provider: Treating Provider/Extender: [name] in Treatment: 9 History of Present Illness HPI Description: Admission 12/17/2020 [name] a 66 year old female with a past medical history of insulin-dependent type 2 diabetes, hypothyroidism and daily1 pack per day cigarette smoker the presents to the clinic for a 6-week history of nonhealing wound to the right first MTPJ. She has been following with [name], podiatry for this issue. She has been using silver alginate with dressing changes. She uses a postsurgical shoe and offloading pads. She currently denies signs of infection. 10/31; patient presents for follow-up. She tolerated the soft cast fine although she states that she felt her foot rolling to one side. She denies signs of infection. She  would like to do the total contact cast today. 12/23/2020 upon evaluation today patient appears to be doing excellent in regard to her wound on the foot and she is in a total contact cast. I do think this is appropriate this is the first cast change which we are obliged to do to ensure nothing is rubbing everything appears to be doing quite well and very pleased in that regard. 11/7; patient presents for follow-up. She had no issues with the cast. She denies signs of infection. 11/14; patient presents for 1 week follow-up. She has had no issues with the cast. She denies signs of infection. 11/21; patient presents for 1 week follow-up. She did develop 2 small areas of skin breakdown on either side of the right foot from the cast rubbing. She states she did not feel the wounds developing. She currently denies signs of infection. 11/28; patient presents for 1 week follow-up. She has no issues or complaints today. She denies pain or acute signs of infection. 12/12; patient presents for follow-up. She reports improvement to her right lateral foot wound. She has been using silver alginate to the area. She has no issues or complaints today. 12/19; patient presents for follow-up. She reports that her right lateral foot wound has healed. She has no issues or complaints today. 1/3; patient presents for follow-up. She has no issues or complaints today. She reports no open wounds. Electronic Signature(s) Signed: 02/23/2021 10:30:32 AM By: Kalman Shan DO Entered By: Kalman Shan on 02/23/2021 10:26:14 -------------------------------------------------------------------------------- Physical Exam Details Patient Name: Date of Service: [name] [name], [name] 02/23/2021 9:00 A M Medical Record Number: [id number] Patient Account Number: [id number] Date of Birth/Sex: Treating RN: 1955/12/20 (66 y.o. F) Primary Care Provider: [name] Other Clinician: Referring Provider: Treating  Provider/Extender: [name] in Treatment: 9 Constitutional respirations regular, non-labored and within target  range for patient.. Cardiovascular 2+ dorsalis pedis/posterior tibialis pulses. Psychiatric pleasant and cooperative. Notes Right lateral foot has epithelization to the previous wound site. T the right first met head there is callus to be at the previous wound site. No open wound or o drainage noted. Electronic Signature(s) Signed: 02/23/2021 10:30:32 AM By: Kalman Shan DO Entered By: Kalman Shan on 02/23/2021 10:28:01 -------------------------------------------------------------------------------- Physician Orders Details Patient Name: Date of Service: [name] [name], [name] 02/23/2021 9:00 A M Medical Record Number: [id number] Patient Account Number: [id number] Date of Birth/Sex: Treating RN: 1955/11/20 (66 y.o. [name] Primary Care Provider: [name] Other Clinician: Referring Provider: Treating Provider/Extender: [name] in Treatment: 9 Verbal / Phone Orders: No Diagnosis Coding Discharge From [facility] Services Discharge from [place] - PAD the closed area for protection with callous pads. Check feet daily. Patient to speak with podiatry for orthotics specialty inserts or shoes. Edema Control - Lymphedema / SCD / Other Elevate legs to the level of the heart or above for 30 minutes daily and/or when sitting, a frequency of: - throughout the day. Avoid standing for long periods of time. Exercise regularly Moisturize legs daily. - and feet daily Electronic Signature(s) Signed: 02/23/2021 10:30:32 AM By: Kalman Shan DO Entered By: Kalman Shan on 02/23/2021 10:28:25 -------------------------------------------------------------------------------- Problem List Details Patient Name: Date of Service: [name] [name], [name] 02/23/2021 9:00 A M Medical Record Number:  [id number] Patient Account Number: [id number] Date of Birth/Sex: Treating RN: 08/16/1955 (66 y.o. F) Primary Care Provider: [name] Other Clinician: Referring Provider: Treating Provider/Extender: [name] in Treatment: 9 Active Problems ICD-10 Encounter Code Description Active Date MDM Diagnosis E11.621 Type 2 diabetes mellitus with foot ulcer 12/17/2020 No Yes L97.512 Non-pressure 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 12/21/2020 No Yes E11.42 Type 2 diabetes mellitus with diabetic polyneuropathy 12/17/2020 No Yes F17.290 Nicotine dependence, other tobacco product, uncomplicated 50/38/8828 No Yes L97.511 Non-pressure chronic ulcer of other part of right foot limited to breakdown of 01/11/2021 No Yes skin Inactive Problems Resolved Problems Electronic Signature(s) Signed: 02/23/2021 10:30:32 AM By: Kalman Shan DO Entered By: Kalman Shan on 02/23/2021 10:25:13 -------------------------------------------------------------------------------- Progress Note Details Patient Name: Date of Service: [place], [name] 02/23/2021 9:00 A M Medical Record Number: [id number] Patient Account Number: [id number] Date of Birth/Sex: Treating RN: 1955/05/27 (66 y.o. F) Primary Care Provider: [name] Other Clinician: Referring Provider: Treating Provider/Extender: [name] in Treatment: 9 Subjective Chief Complaint Information obtained from Patient 10/27; Right Plantar foot wound 11/21; 2 small areas of skin breakdown to the right foot following cast placement History of Present Illness (HPI) Admission 12/17/2020 Ms. Tommy Minichiello is a 66 year old female with a past medical history of insulin-dependent type 2 diabetes, hypothyroidism and daily1 pack per day cigarette smoker the presents to the clinic for a 6-week history of nonhealing wound to the right first  MTPJ. She has been following with [name], podiatry for this issue. She has been using silver alginate with dressing changes. She uses a postsurgical shoe and offloading pads. She currently denies signs of infection. 10/31; patient presents for follow-up. She tolerated the soft cast fine although she states that she felt her foot rolling to one side. She denies signs of infection. She would like to do the total contact cast today. 12/23/2020 upon evaluation today patient appears to be doing excellent in  regard to her wound on the foot and she is in a total contact cast. I do think this is appropriate this is the first cast change which we are obliged to do to ensure nothing is rubbing everything appears to be doing quite well and very pleased in that regard. 11/7; patient presents for follow-up. She had no issues with the cast. She denies signs of infection. 11/14; patient presents for 1 week follow-up. She has had no issues with the cast. She denies signs of infection. 11/21; patient presents for 1 week follow-up. She did develop 2 small areas of skin breakdown on either side of the right foot from the cast rubbing. She states she did not feel the wounds developing. She currently denies signs of infection. 11/28; patient presents for 1 week follow-up. She has no issues or complaints today. She denies pain or acute signs of infection. 12/12; patient presents for follow-up. She reports improvement to her right lateral foot wound. She has been using silver alginate to the area. She has no issues or complaints today. 12/19; patient presents for follow-up. She reports that her right lateral foot wound has healed. She has no issues or complaints today. 1/3; patient presents for follow-up. She has no issues or complaints today. She reports no open wounds. Patient History Information obtained from Patient, Chart. Family History Unknown History. Social History Current every day smoker - 1 pack/a/day,  Alcohol Use - Never, Drug Use - No History, Caffeine Use - Moderate. Medical History Eyes Denies history of Cataracts, Glaucoma, Optic Neuritis Ear/Nose/Mouth/Throat Denies history of Chronic sinus problems/congestion, Middle ear problems Hematologic/Lymphatic Denies history of Anemia, Hemophilia, Human Immunodeficiency Virus, Lymphedema, Sickle Cell Disease Respiratory Patient has history of Sleep Apnea Denies history of Aspiration, Asthma, Chronic Obstructive Pulmonary Disease (COPD), Pneumothorax, Tuberculosis Cardiovascular Patient has history of Coronary Artery Disease, Hypertension Denies history of Angina, Arrhythmia, Congestive Heart Failure, Deep Vein Thrombosis, Hypotension, Myocardial Infarction, Peripheral Arterial Disease, Peripheral Venous Disease, Phlebitis, Vasculitis Gastrointestinal Denies history of Cirrhosis , Colitis, Crohnoos, Hepatitis A, Hepatitis B, Hepatitis C Endocrine Patient has history of Type II Diabetes Denies history of Type I Diabetes Genitourinary Denies history of End Stage Renal Disease Immunological Denies history of Lupus Erythematosus, Raynaudoos, Scleroderma Integumentary (Skin) Denies history of History of Burn Musculoskeletal Denies history of Gout, Rheumatoid Arthritis, Osteoarthritis, Osteomyelitis Neurologic Patient has history of Neuropathy Denies history of Dementia, Quadriplegia, Paraplegia, Seizure Disorder Hospitalization/Surgery History - 2nd and 3rd right toe amps. - total hysterectomy. - cholecystectomy. - 2 knee surgery's on left and 1 on right. Medical A Surgical History Notes nd Cardiovascular hypercholesterolemia Objective Constitutional respirations regular, non-labored and within target range for patient.. Vitals Time Taken: 8:54 AM, Height: 67 in, Weight: 277 lbs, BMI: 43.4, Temperature: 97.8 F, Pulse: 83 bpm, Respiratory Rate: 18 breaths/min, Blood Pressure: 157/93 mmHg. Cardiovascular 2+ dorsalis  pedis/posterior tibialis pulses. Psychiatric pleasant and cooperative. General Notes: Right lateral foot has epithelization to the previous wound site. T the right first met head there is callus to be at the previous wound site. No o open wound or drainage noted. Assessment Active Problems ICD-10 Type 2 diabetes mellitus with foot ulcer Non-pressure 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 Type 2 diabetes mellitus with diabetic polyneuropathy Nicotine dependence, other tobacco product, uncomplicated Non-pressure chronic ulcer of other part of right foot limited to breakdown of skin Patient continues to have no open wounds. Patient reports no drainage from the plantar foot wound. At this time I  recommended using callus pads and discussing orthotics with podiatry. Patient may follow-up as needed. She knows to call with any questions or concerns. Plan Discharge From [facility] Services: Discharge from [place] - PAD the closed area for protection with callous pads. Check feet daily. Patient to speak with podiatry for orthotics specialty inserts or shoes. Edema Control - Lymphedema / SCD / Other: Elevate legs to the level of the heart or above for 30 minutes daily and/or when sitting, a frequency of: - throughout the day. Avoid standing for long periods of time. Exercise regularly Moisturize legs daily. - and feet daily 1. Discharge from clinic due to closed wound 2. Follow-up as needed 3. Follow-up with podiatry for orthotics and used callus pads daily Electronic Signature(s) Signed: 02/23/2021 10:30:32 AM By: Kalman Shan DO Entered By: Kalman Shan on 02/23/2021 10:29:44 -------------------------------------------------------------------------------- HxROS Details Patient Name: Date of Service: [name] [name], [name] 02/23/2021 9:00 A M Medical Record Number: [id number] Patient Account Number: [id number] Date of Birth/Sex: Treating RN: 12/03/1955 (66  y.o. F) Primary Care Provider: [name] Other Clinician: Referring Provider: Treating Provider/Extender: [name] in Treatment: 9 Information Obtained From Patient Chart Eyes Medical History: Negative for: Cataracts; Glaucoma; Optic Neuritis Ear/Nose/Mouth/Throat Medical History: Negative for: Chronic sinus problems/congestion; Middle ear problems Hematologic/Lymphatic Medical History: Negative for: Anemia; Hemophilia; Human Immunodeficiency Virus; Lymphedema; Sickle Cell Disease Respiratory Medical History: Positive for: Sleep Apnea Negative for: Aspiration; Asthma; Chronic Obstructive Pulmonary Disease (COPD); Pneumothorax; Tuberculosis Cardiovascular Medical History: Positive for: Coronary Artery Disease; Hypertension Negative for: Angina; Arrhythmia; Congestive Heart Failure; Deep Vein Thrombosis; Hypotension; Myocardial Infarction; Peripheral Arterial Disease; Peripheral Venous Disease; Phlebitis; Vasculitis Past Medical History Notes: hypercholesterolemia Gastrointestinal Medical History: Negative for: Cirrhosis ; Colitis; Crohns; Hepatitis A; Hepatitis B; Hepatitis C Endocrine Medical History: Positive for: Type II Diabetes Negative for: Type I Diabetes Genitourinary Medical History: Negative for: End Stage Renal Disease Immunological Medical History: Negative for: Lupus Erythematosus; Raynauds; Scleroderma Integumentary (Skin) Medical History: Negative for: History of Burn Musculoskeletal Medical History: Negative for: Gout; Rheumatoid Arthritis; Osteoarthritis; Osteomyelitis Neurologic Medical History: Positive for: Neuropathy Negative for: Dementia; Quadriplegia; Paraplegia; Seizure Disorder Immunizations Pneumococcal Vaccine: Received Pneumococcal Vaccination: Yes Received Pneumococcal Vaccination On or After 60th Birthday: Yes Tetanus Vaccine: Last tetanus shot: 12/17/2020 Implantable Devices No devices  added Hospitalization / Surgery History Type of Hospitalization/Surgery 2nd and 3rd right toe amps total hysterectomy cholecystectomy 2 knee surgery's on left and 1 on right Family and Social History Unknown History: Yes; Current every day smoker - 1 pack/a/day; Alcohol Use: Never; Drug Use: No History; Caffeine Use: Moderate; Financial Concerns: No; Food, Clothing or Shelter Needs: No; Support System Lacking: No; Transportation Concerns: No Electronic Signature(s) Signed: 02/23/2021 10:30:32 AM By: Kalman Shan DO Entered By: Kalman Shan on 02/23/2021 10:26:26 -------------------------------------------------------------------------------- SuperBill Details Patient Name: Date of Service: [name] [name], [name] 02/23/2021 Medical Record Number: [id number] Patient Account Number: [id number] Date of Birth/Sex: Treating RN: [birth year] (66 y.o. [name], [name] Primary Care Provider: [name] Other Clinician: Referring Provider: Treating Provider/Extender: [name] in Treatment: 9 Diagnosis Coding ICD-10 Codes Code Description E11.621 Type 2 diabetes mellitus with foot ulcer L97.512 Non-pressure 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 E11.42 Type 2 diabetes mellitus with diabetic polyneuropathy F17.290 Nicotine dependence, other tobacco product, uncomplicated W97.989 Non-pressure chronic ulcer of other part of right foot limited to breakdown of skin Facility Procedures CPT4 Code: [id number]  Description: 99213 - WOUND CARE VISIT-LEV 3 EST PT Modifier: Quantity: 1 Physician Procedures : CPT4 Code Description Modifier [id number] 99213 - WC PHYS LEVEL 3 - EST PT ICD-10 Diagnosis Description L97.512 Non-pressure 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 E11.621 Type 2 diabetes mellitus with foot ulcer E11.42 Type 2  diabetes mellitus with diabetic polyneuropathy F17.290 Nicotine dependence,  other tobacco product, uncomplicated Quantity: 1 Electronic Signature(s) Signed: 02/23/2021 10:30:32 AM By: Kalman Shan DO Entered By: Kalman Shan on 02/23/2021 10:30:06

## 2021-02-24 NOTE — Progress Notes (Signed)
[name], [name] ([id number]) Visit Report for 02/23/2021 Arrival Information Details Patient Name: Date of Service: [name], [name] 02/23/2021 9:00 [name] M Medical Record Number: [id number] Patient Account Number: [id number] Date of Birth/Sex: Treating RN: Jun 21, 1955 (66 y.o. F) Primary Care Lashanda Storlie: [name] Other Clinician: Referring Keisy Strickler: Treating Summers Buendia/Extender: [name] in Treatment: 9 Visit Information History Since Last Visit Added or deleted any medications: No Patient Arrived: Ambulatory Any new allergies or adverse reactions: No Arrival Time: 08:54 Had [name] fall or experienced change in No Accompanied By: self activities of daily living that may affect Transfer Assistance: None risk of falls: Patient Identification Verified: Yes Signs or symptoms of abuse/neglect since last visito No Secondary Verification Process Completed: Yes Hospitalized since last visit: No Patient Requires Transmission-Based Precautions: No Implantable device outside of the clinic excluding No Patient Has Alerts: No cellular tissue based products placed in the center since last visit: Has Dressing in Place as Prescribed: Yes Pain Present Now: No Electronic Signature(s) Signed: 02/24/2021 10:36:58 AM By: Sandre Kitty Entered By: Sandre Kitty on 02/23/2021 08:54:43 -------------------------------------------------------------------------------- Clinic Level of Care Assessment Details Patient Name: Date of Service: [name], [name] 02/23/2021 9:00 [name] M Medical Record Number: [id number] Patient Account Number: [id number] Date of Birth/Sex: Treating RN: [birth year] (66 y.o. [name], [name] Primary Care Sloka Volante: [name] Other Clinician: Referring Auron Tadros: Treating Cleopatra Sardo/Extender: [name] in Treatment: 9 Clinic Level of Care Assessment Items TOOL 4 Quantity Score X- 1 0 Use when  only an EandM is performed on FOLLOW-UP visit ASSESSMENTS - Nursing Assessment / Reassessment X- 1 10 Reassessment of Co-morbidities (includes updates in patient status) X- 1 5 Reassessment of Adherence to Treatment Plan ASSESSMENTS - Wound and Skin [name] ssessment / Reassessment X - Simple Wound Assessment / Reassessment - one wound 1 5 []  - 0 Complex Wound Assessment / Reassessment - multiple wounds X- 1 10 Dermatologic / Skin Assessment (not related to wound area) ASSESSMENTS - Focused Assessment []  - 0 Circumferential Edema Measurements - multi extremities X- 1 10 Nutritional Assessment / Counseling / Intervention []  - 0 Lower Extremity Assessment (monofilament, tuning fork, pulses) []  - 0 Peripheral Arterial Disease Assessment (using hand held doppler) ASSESSMENTS - Ostomy and/or Continence Assessment and Care []  - 0 Incontinence Assessment and Management []  - 0 Ostomy Care Assessment and Management (repouching, etc.) PROCESS - Coordination of Care X - Simple Patient / Family Education for ongoing care 1 15 []  - 0 Complex (extensive) Patient / Family Education for ongoing care X- 1 10 Staff obtains Consents, Records, T Results / Process Orders est []  - 0 Staff telephones HHA, Nursing Homes / Clarify orders / etc []  - 0 Routine Transfer to another Facility (non-emergent condition) []  - 0 Routine Hospital Admission (non-emergent condition) []  - 0 New Admissions / Biomedical engineer / Ordering NPWT Apligraf, etc. , []  - 0 Emergency Hospital Admission (emergent condition) X- 1 10 Simple Discharge Coordination []  - 0 Complex (extensive) Discharge Coordination PROCESS - Special Needs []  - 0 Pediatric / Minor Patient Management []  - 0 Isolation Patient Management []  - 0 Hearing / Language / Visual special needs []  - 0 Assessment of Community assistance (transportation, D/C planning, etc.) []  - 0 Additional assistance / Altered mentation []  - 0 Support  Surface(s) Assessment (bed, cushion, seat, etc.) INTERVENTIONS - Wound Cleansing / Measurement []  - 0 Simple Wound Cleansing - one wound []  - 0 Complex  Wound Cleansing - multiple wounds []  - 0 Wound Imaging (photographs - any number of wounds) []  - 0 Wound Tracing (instead of photographs) []  - 0 Simple Wound Measurement - one wound []  - 0 Complex Wound Measurement - multiple wounds INTERVENTIONS - Wound Dressings []  - 0 Small Wound Dressing one or multiple wounds []  - 0 Medium Wound Dressing one or multiple wounds []  - 0 Large Wound Dressing one or multiple wounds []  - 0 Application of Medications - topical []  - 0 Application of Medications - injection INTERVENTIONS - Miscellaneous []  - 0 External ear exam []  - 0 Specimen Collection (cultures, biopsies, blood, body fluids, etc.) []  - 0 Specimen(s) / Culture(s) sent or taken to Lab for analysis []  - 0 Patient Transfer (multiple staff / Civil Service fast streamer / Similar devices) []  - 0 Simple Staple / Suture removal (25 or less) []  - 0 Complex Staple / Suture removal (26 or more) []  - 0 Hypo / Hyperglycemic Management (close monitor of Blood Glucose) []  - 0 Ankle / Brachial Index (ABI) - do not check if billed separately X- 1 5 Vital Signs Has the patient been seen at the hospital within the last three years: Yes Total Score: 80 Level Of Care: New/Established - Level 3 Electronic Signature(s) Signed: 02/23/2021 5:28:06 PM By: Deon Pilling RN, BSN Entered By: Deon Pilling on 02/23/2021 09:03:53 -------------------------------------------------------------------------------- Encounter Discharge Information Details Patient Name: Date of Service: [name], [name] 02/23/2021 9:00 [name] M Medical Record Number: [id number] Patient Account Number: [id number] Date of Birth/Sex: Treating RN: [birth year] (66 y.o. [name], [name] Primary Care Kaylene Dawn: [name] Other Clinician: Referring Serenidy Waltz: Treating Elfriede Bonini/Extender:  [name] in Treatment: 9 Encounter Discharge Information Items Discharge Condition: Stable Ambulatory Status: Ambulatory Discharge Destination: Home Transportation: Private Auto Accompanied By: self Schedule Follow-up Appointment: No Clinical Summary of Care: Electronic Signature(s) Signed: 02/23/2021 5:28:06 PM By: Deon Pilling RN, BSN Entered By: Deon Pilling on 02/23/2021 09:04:54 -------------------------------------------------------------------------------- Lower Extremity Assessment Details Patient Name: Date of Service: [name], [name] 02/23/2021 9:00 [name] M Medical Record Number: [id number] Patient Account Number: [id number] Date of Birth/Sex: Treating RN: 1955/05/18 (66 y.o. [name], [name] Primary Care Dashton Czerwinski: [name] Other Clinician: Referring Suhayla Chisom: Treating Hadli Vandemark/Extender: [name] in Treatment: 9 Edema Assessment Assessed: [name]: No] [Right: No] [Left: Edema] [Right: :] Calf Left: Right: Point of Measurement: 32 cm From Medial Instep Ankle Left: Right: Point of Measurement: 8 cm From Medial Instep Notes no wounds. Electronic Signature(s) Signed: 02/23/2021 5:28:06 PM By: Deon Pilling RN, BSN Entered By: Deon Pilling on 02/23/2021 09:00:12 -------------------------------------------------------------------------------- Multi Wound Chart Details Patient Name: Date of Service: [name], [name] 02/23/2021 9:00 [name] M Medical Record Number: [id number] Patient Account Number: [id number] Date of Birth/Sex: Treating RN: 1955-11-30 (66 y.o. F) Primary Care Ariyona Eid: [name] Other Clinician: Referring Ashlay Altieri: Treating Jalil Lorusso/Extender: [name] in Treatment: 9 Vital Signs Height(in): 67 Pulse(bpm): 83 Weight(lbs): 176 Blood Pressure(mmHg): 157/93 Body Mass Index(BMI): 43 Temperature(F): 97.8 Respiratory  Rate(breaths/min): 18 Wound Assessments Treatment Notes Electronic Signature(s) Signed: 02/23/2021 10:30:32 AM By: Kalman Shan DO Entered By: Kalman Shan on 02/23/2021 10:25:21 -------------------------------------------------------------------------------- Multi-Disciplinary Care Plan Details Patient Name: Date of Service: [name], [name] 02/23/2021 9:00 [name] M Medical Record Number: [id number] Patient Account Number: [id number] Date of Birth/Sex: Treating RN: 23-Jun-1955 (66 y.o. [name] Primary Care Cyndi Montejano: [name] Other  Clinician: Referring Perlie Scheuring: Treating Elenna Spratling/Extender: [name] in Treatment: 9 Active Inactive Electronic Signature(s) Signed: 02/23/2021 5:28:06 PM By: Deon Pilling RN, BSN Entered By: Deon Pilling on 02/23/2021 09:01:42 -------------------------------------------------------------------------------- Pain Assessment Details Patient Name: Date of Service: [name], [name] 02/23/2021 9:00 [name] M Medical Record Number: [id number] Patient Account Number: [id number] Date of Birth/Sex: Treating RN: 04-17-1955 (66 y.o. F) Primary Care Verner Kopischke: [name] Other Clinician: Referring Burma Ketcher: Treating Shaquile Lutze/Extender: [name] in Treatment: 9 Active Problems Location of Pain Severity and Description of Pain Patient Has Paino No Site Locations Pain Management and Medication Current Pain Management: Electronic Signature(s) Signed: 02/24/2021 10:36:58 AM By: Sandre Kitty Entered By: Sandre Kitty on 02/23/2021 08:55:09 -------------------------------------------------------------------------------- Patient/Caregiver Education Details Patient Name: Date of Service: [name], [name] Raliegh Ip 1/3/2023andnbsp9:00 [name] M Medical Record Number: [id number] Patient Account Number: [id number] Date of Birth/Gender: Treating RN: [birth year] (66 y.o. [name], [name] Primary Care Physician: [name] Other Clinician: Referring Physician: Treating Physician/Extender: [name] in Treatment: 9 Education Assessment Education Provided To: Patient Education Topics Provided Wound/Skin Impairment: Handouts: Skin Care Do's and Dont's Methods: Explain/Verbal Responses: Reinforcements needed Electronic Signature(s) Signed: 02/23/2021 5:28:06 PM By: Deon Pilling RN, BSN Entered By: Deon Pilling on 02/23/2021 09:01:54 -------------------------------------------------------------------------------- Vitals Details Patient Name: Date of Service: [name], [name] 02/23/2021 9:00 [name] M Medical Record Number: [id number] Patient Account Number: [id number] Date of Birth/Sex: Treating RN: 09-Feb-1956 (66 y.o. F) Primary Care Glennys Schorsch: [name] Other Clinician: Referring Lenn Volker: Treating Zae Kirtz/Extender: [name] in Treatment: 9 Vital Signs Time Taken: 08:54 Temperature (F): 97.8 Height (in): 67 Pulse (bpm): 83 Weight (lbs): 277 Respiratory Rate (breaths/min): 18 Body Mass Index (BMI): 43.4 Blood Pressure (mmHg): 157/93 Reference Range: 80 - 120 mg / dl Electronic Signature(s) Signed: 02/24/2021 10:36:58 AM By: Sandre Kitty Entered By: Sandre Kitty on 02/23/2021 08:55:00

## 2021-03-17 DIAGNOSIS — E039 Hypothyroidism, unspecified: Secondary | ICD-10-CM | POA: Diagnosis not present

## 2021-03-17 DIAGNOSIS — E1140 Type 2 diabetes mellitus with diabetic neuropathy, unspecified: Secondary | ICD-10-CM | POA: Diagnosis not present

## 2021-03-17 DIAGNOSIS — I10 Essential (primary) hypertension: Secondary | ICD-10-CM | POA: Diagnosis not present

## 2021-03-17 DIAGNOSIS — E785 Hyperlipidemia, unspecified: Secondary | ICD-10-CM | POA: Diagnosis not present

## 2021-03-24 DIAGNOSIS — Z1339 Encounter for screening examination for other mental health and behavioral disorders: Secondary | ICD-10-CM | POA: Diagnosis not present

## 2021-03-24 DIAGNOSIS — Z794 Long term (current) use of insulin: Secondary | ICD-10-CM | POA: Diagnosis not present

## 2021-03-24 DIAGNOSIS — Z720 Tobacco use: Secondary | ICD-10-CM | POA: Diagnosis not present

## 2021-03-24 DIAGNOSIS — E785 Hyperlipidemia, unspecified: Secondary | ICD-10-CM | POA: Diagnosis not present

## 2021-03-24 DIAGNOSIS — Z1212 Encounter for screening for malignant neoplasm of rectum: Secondary | ICD-10-CM | POA: Diagnosis not present

## 2021-03-24 DIAGNOSIS — E1140 Type 2 diabetes mellitus with diabetic neuropathy, unspecified: Secondary | ICD-10-CM | POA: Diagnosis not present

## 2021-03-24 DIAGNOSIS — Z0000 Encounter for general adult medical examination without abnormal findings: Secondary | ICD-10-CM | POA: Diagnosis not present

## 2021-03-24 DIAGNOSIS — Z1331 Encounter for screening for depression: Secondary | ICD-10-CM | POA: Diagnosis not present

## 2021-03-24 DIAGNOSIS — G629 Polyneuropathy, unspecified: Secondary | ICD-10-CM | POA: Diagnosis not present

## 2021-03-24 DIAGNOSIS — I10 Essential (primary) hypertension: Secondary | ICD-10-CM | POA: Diagnosis not present

## 2021-03-24 DIAGNOSIS — R82998 Other abnormal findings in urine: Secondary | ICD-10-CM | POA: Diagnosis not present

## 2021-03-29 ENCOUNTER — Telehealth (HOSPITAL_COMMUNITY): Payer: Self-pay

## 2021-03-29 NOTE — Telephone Encounter (Signed)
Patient interested in returning as a patient with [facility]. Is on Medications and will be forwarding all records to the office. She was also advised to complete the new patient paperwork and email/walk in or fax [place] the paperwork and then we would schedule her appointment.

## 2021-04-12 ENCOUNTER — Other Ambulatory Visit: Payer: Self-pay

## 2021-04-12 ENCOUNTER — Ambulatory Visit (INDEPENDENT_AMBULATORY_CARE_PROVIDER_SITE_OTHER): Payer: Medicare Other | Admitting: Podiatry

## 2021-04-12 DIAGNOSIS — B351 Tinea unguium: Secondary | ICD-10-CM | POA: Diagnosis not present

## 2021-04-12 DIAGNOSIS — M79676 Pain in unspecified toe(s): Secondary | ICD-10-CM

## 2021-04-12 DIAGNOSIS — E0843 Diabetes mellitus due to underlying condition with diabetic autonomic (poly)neuropathy: Secondary | ICD-10-CM | POA: Diagnosis not present

## 2021-04-12 DIAGNOSIS — L97512 Non-pressure 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: Secondary | ICD-10-CM | POA: Diagnosis not present

## 2021-04-12 MED ORDER — GENTAMICIN SULFATE 0.1 % EX CREA: 1.0000 "application " | TOPICAL_CREAM | Freq: Two times a day (BID) | CUTANEOUS | 1 refills | Status: DC

## 2021-04-12 NOTE — Progress Notes (Signed)
Subjective:  66 y.o. female with PMHx of diabetes mellitus presenting today for f routine foot care.  Patient was discharged from the [facility] wound care center about 1 month ago.  She says she is doing well.  She presents today for routine foot care  Past Medical History:  Diagnosis Date   Anxiety    Arthritis    knees   Coronary artery disease    Depression    Fatty liver    History of nuclear stress test 12/16/2011   exercise myoview; normal images with 2-68mm ST-segment depression - subsequent cath revelaed subtotally occluded small 2nd marginal branch & 75% PDA lesion, normal LV function   Hypertension    Hypothyroidism    Neuropathy    in feet   OSA on CPAP    AHI = 44 (per patient) setting 13 per pt   Personal history of kidney stones    Primary localized osteoarthritis of left knee 04/24/2015   Primary localized osteoarthritis of right knee 07/02/2015   Tobacco abuse    Type 2 diabetes mellitus (HCC)    insulin pump       Objective/Physical Exam General: The patient is alert and oriented x3 in no acute distress.  Dermatology:  Hyperkeratotic elongated nails noted bilateral.  Wound noted to the plantar aspect of the first MTP joint measuring approximately 1.0 x 1.0 x 0.2 cm.  To the noted ulceration there is no eschar.  Minimal serosanguineous drainage.  There is no exposed bone muscle tendon ligament or joint.  No malodor.  Periwound integrity intact Skin is warm, dry and supple bilateral lower extremities.  Vascular: Palpable pedal pulses bilaterally. No edema or erythema noted. Capillary refill within normal limits.  Neurological: Epicritic and protective threshold diminished bilaterally.   Musculoskeletal Exam: History of prior second and third toe amputations RT  Radiographic exam RT foot 11/10/2020: No evidence of osteolytic erosion or cortical irregularity that would be concerning for osteomyelitis at the moment.  History of prior toe amputations at the MTP  second and third toes.  Diffuse degenerative changes noted consistent with chronic osteoarthritis with possible Charcot degenerative changes of the midfoot given the patient's history of diabetes mellitus  Assessment: 1.  Ulcer subfirst MTPJ right foot secondary to diabetes mellitus 2. diabetes mellitus w/ peripheral neuropathy 3.  Pain due to onychomycosis of toenails left   Plan of Care:  1. Patient was evaluated. 2.  Patient had completed wound care and was discharged from the wound care center about 1 month ago 3.  After debridement of the callus to the right plantar foot, there was an underlying ulcer.  Medically necessary excisional debridement including subcutaneous tissue was performed using a 312 scalpel and tissue nipper.  Excisional debridement of the necrotic nonviable tissue down to healthier bleeding viable tissue was performed with postdebridement measurement same as pre- 4.  Prescription for gentamicin cream to apply daily 5.  Appointment with Pedorthist for diabetic shoes and insoles 6.  Return to clinic in 3 weeks and for follow-up x-ray RT foot  *Husband's name is Jenny Reichmann. Pet dog is LullaBell  Edrick Kins, DPM [facility]  Dr. Edrick Kins, DPM    [address], [place] [address]  Office [phone]  Fax [phone]

## 2021-04-12 NOTE — Addendum Note (Signed)
Addended by: Edrick Kins on: 04/12/2021 09:03 AM   Modules accepted: Orders

## 2021-04-14 ENCOUNTER — Encounter (INDEPENDENT_AMBULATORY_CARE_PROVIDER_SITE_OTHER): Payer: Self-pay

## 2021-04-14 DIAGNOSIS — Z961 Presence of intraocular lens: Secondary | ICD-10-CM | POA: Diagnosis not present

## 2021-04-14 DIAGNOSIS — E113393 Type 2 diabetes mellitus with moderate nonproliferative diabetic retinopathy without macular edema, bilateral: Secondary | ICD-10-CM | POA: Diagnosis not present

## 2021-04-14 DIAGNOSIS — H43393 Other vitreous opacities, bilateral: Secondary | ICD-10-CM | POA: Diagnosis not present

## 2021-04-14 DIAGNOSIS — H04123 Dry eye syndrome of bilateral lacrimal glands: Secondary | ICD-10-CM | POA: Diagnosis not present

## 2021-04-19 ENCOUNTER — Other Ambulatory Visit: Payer: Medicare Other

## 2021-04-22 ENCOUNTER — Ambulatory Visit: Payer: Medicare Other

## 2021-04-22 ENCOUNTER — Other Ambulatory Visit: Payer: Self-pay

## 2021-04-22 ENCOUNTER — Telehealth: Payer: Self-pay

## 2021-04-22 ENCOUNTER — Ambulatory Visit (INDEPENDENT_AMBULATORY_CARE_PROVIDER_SITE_OTHER): Payer: Medicare Other | Admitting: Ophthalmology

## 2021-04-22 ENCOUNTER — Encounter (INDEPENDENT_AMBULATORY_CARE_PROVIDER_SITE_OTHER): Payer: Self-pay | Admitting: Ophthalmology

## 2021-04-22 DIAGNOSIS — E113492 Type 2 diabetes mellitus with severe nonproliferative diabetic retinopathy without macular edema, left eye: Secondary | ICD-10-CM | POA: Diagnosis not present

## 2021-04-22 DIAGNOSIS — G4733 Obstructive sleep apnea (adult) (pediatric): Secondary | ICD-10-CM | POA: Diagnosis not present

## 2021-04-22 DIAGNOSIS — Z9989 Dependence on other enabling machines and devices: Secondary | ICD-10-CM

## 2021-04-22 DIAGNOSIS — Z89421 Acquired absence of other right toe(s): Secondary | ICD-10-CM

## 2021-04-22 DIAGNOSIS — E113411 Type 2 diabetes mellitus with severe nonproliferative diabetic retinopathy with macular edema, right eye: Secondary | ICD-10-CM | POA: Diagnosis not present

## 2021-04-22 DIAGNOSIS — E0843 Diabetes mellitus due to underlying condition with diabetic autonomic (poly)neuropathy: Secondary | ICD-10-CM

## 2021-04-22 NOTE — Assessment & Plan Note (Signed)
Very severe NPDR, with each quadrant displaying evidence of intraretinal hemorrhages and dot blot hemorrhages, high risk for nonperfusion and progression to PDR. ?

## 2021-04-22 NOTE — Telephone Encounter (Signed)
Casts sent to [employer] - HOLD FOR CMN ?

## 2021-04-22 NOTE — Assessment & Plan Note (Addendum)
The nature of diabetic macular edema was discussed with the patient. Treatment options were outlined including medical therapy, laser & vitrectomy. The use of injectable medications reviewed, including Avastin, Lucentis, and Eylea. Periodic injections into the eye are likely to resolve diabetic macular edema (swelling in the center of vision). Initially, injections are delivered are delivered every 4-6 weeks, and the interval extended as the condition improves. On average, 8-9 injections the first year, and 5 in year 2. Improvement in the condition most often improves on medical therapy. Occasional use of focal laser is also recommended for residual macular edema (swelling). Excellent control of blood glucose and blood pressure are encouraged under the care of a primary physician or endocrinologist. Similarly, attempts to maintain serum cholesterol, low density lipoproteins, and high-density lipoproteins in a favorable range were recommended.  ? ?The nature of severe nonproliferative diabetic retinopathy discussed with the patient as well as the need for more frequent follow up and likely progression to proliferative disease in the near future. The options of continued observation versus panretinal photocoagulation at this time were reviewed as well as the risks, benefits, and alternatives. More recent option includes the use of ocular injectable medications to slow progression of retinal disease. Tight control of glucose, blood pressure, and serum lipid levels were recommended under the direction of general physician or endocrinologist, as well as avoidance of smoking and maintenance of normal body weight. The 2-year risk of progression to proliferative diabetic retinopathy is 60%. ? ?Schedule return visit for fluorescein angiography right left, with likely intravitreal Avastin OD followed thereafter by focal laser treatment which will treat decrease her treatment burden ? ?However she is likely need over time  peripheral anterior PRP to based upon likely severe nonperfusion predicted by the clinical findings today. ?

## 2021-04-22 NOTE — Progress Notes (Signed)
SITUATION ?Reason for Consult: Evaluation for Prefabricated Diabetic Shoes and Custom Diabetic Inserts. ?Patient / Caregiver Report: Patient would like well fitting shoes ? ?OBJECTIVE DATA: ?Patient History / Diagnosis:  ?  ICD-10-CM   ?1. Diabetes mellitus due to underlying condition with diabetic autonomic neuropathy, unspecified whether long term insulin use (HCC)  E08.43   ?  ?2. History of amputation of lesser toe, right ([place])  Z89.421   ?  ? ? ?Current or Previous Devices:   Historical user ? ?In-Person Foot Examination: ?Ulcers & Callousing:   Historical ? ?Shoe Size: 8.5XW ? ?ORTHOTIC RECOMMENDATION ?Recommended Devices: ?- 1x pair prefabricated PDAC approved diabetic shoes; Patient Selected - Shon Millet Blue 843 Size 8.5XW ?- 3x pair custom-to-patient PDAC approved vacuum formed diabetic insoles. ? ?GOALS OF SHOES AND INSOLES ?- Reduce shear and pressure ?- Reduce / Prevent callus formation ?- Reduce / Prevent ulceration ?- Protect the fragile healing compromised diabetic foot. ? ?Patient would benefit from diabetic shoes and inserts as patient has diabetes mellitus and the patient has one or more of the following conditions: ?- History of partial or complete amputation of the foot ?- History of previous foot ulceration. ?- History of pre-ulcerative callus ?- Peripheral neuropathy with evidence of callus formation ?- Foot deformity ?- Poor circulation ? ?ACTIONS PERFORMED ?Patient was casted for insoles via crush box and measured for shoes via brannock device. Procedure was explained and patient tolerated procedure well. All questions were answered and concerns addressed. ? ?PLAN ?Patient is to be contacted and scheduled for fitting once CMN is obtained from treaing physician and shoes and insoles have been fabricated and received. ? ?

## 2021-04-22 NOTE — Assessment & Plan Note (Signed)
Patient reports excellent compliance with CPAP use ?

## 2021-04-22 NOTE — Patient Instructions (Signed)
The nature of diabetic retinopathy was explained using the following analogy: "Retinopathy develops in the body's blood supply like salty water corrodes the linings of pipes in a house, until rust appears, then holes in the pipes develop which leak followed by destruction and loss of the pipes as the corrosion turns them to dust. In a similar fashion, Diabetes damages the blood supply of the body by cumulative long--term elevated blood sugar, which corrodes the blood supply in the body, particularly the blood vessels supplying the retina, kidneys, and nerves".  Thus, control of blood sugar, slows the progression of the corrosive effect of diabetes mellitus.    

## 2021-04-22 NOTE — Progress Notes (Signed)
04/22/2021     CHIEF COMPLAINT Patient presents for  Chief Complaint  Patient presents with   Diabetic Eye Exam      HISTORY OF PRESENT ILLNESS: [name] is a 66 y.o. female who presents to the clinic today for:   HPI     Diabetic Eye Exam           Vision: is blurred for near   Associated Symptoms: Floaters (OU, longstanding, "doesn't bother me.").  Negative for Flashes   Diabetes Type: Type 2, on insulin and taking oral medications   Onset: 13 years ago   Blood Sugars: is controlled   Last Blood Glucose: 124, this morning, before eating.   Last A1C: 6.3 (6.3, 2 months ago.)         Comments   NP- Diabetic Retinopathy OU, Possible retina Edema OU, referred by Dr. Katy Fitch. Pt states "what I have noticed myself is at night when I am looking at a screen, my eyes are tired, and even with my readers on which are a +1.75, I still have difficulty seeing up close." Pt states "about three months ago I fell HARD, I hit the floor HARD, and out of my left eye I saw a flash of light. Since then I haven't noticed it again."       Last edited by Laurin Coder on 04/22/2021  9:32 AM.      Referring physician: Clent Jacks, MD [address] [place],  [place] [address]  HISTORICAL INFORMATION:   Selected notes from the MEDICAL RECORD NUMBER    Lab Results  Component Value Date   HGBA1C 8.0 (H) 11/13/2016     CURRENT MEDICATIONS: No current outpatient medications on file. (Ophthalmic Drugs)   No current facility-administered medications for this visit. (Ophthalmic Drugs)   Current Outpatient Medications (Other)  Medication Sig   ACCU-CHEK AVIVA PLUS test strip    ALPRAZolam (XANAX) 0.5 MG tablet Take 0.5 mg by mouth at bedtime as needed for anxiety.    alum & mag hydroxide-simeth (MAALOX/MYLANTA) 200-200-20 MG/5ML suspension Take 15 mLs by mouth every 4 (four) hours as needed for indigestion or heartburn.   atorvastatin (LIPITOR) 20 MG tablet Take 20 mg  by mouth daily.   atorvastatin (LIPITOR) 20 MG tablet Take 1 tablet by mouth daily.   buPROPion (WELLBUTRIN SR) 100 MG 12 hr tablet Take 100 mg by mouth every 12 (twelve) hours.   buPROPion (WELLBUTRIN XL) 150 MG 24 hr tablet TAKE 1 TABLET BY MOUTH EVERY DAY IN THE MORNING   cephALEXin (KEFLEX) 500 MG capsule Take 1 capsule (500 mg total) by mouth 4 (four) times daily.   Cholecalciferol (VITAMIN D PO) Take by mouth.   clindamycin (CLEOCIN) 300 MG capsule Take 1 capsule (300 mg total) by mouth 3 (three) times daily.   clotrimazole-betamethasone (LOTRISONE) cream Apply 1 application topically 2 (two) times daily.   Continuous Blood Gluc Receiver (FREESTYLE LIBRE 14 DAY READER) DEVI Use to monitor blood glucose   Continuous Blood Gluc Sensor (FREESTYLE LIBRE 14 DAY SENSOR) MISC Use to monitor blood glucose continuously-Dx code E11.29   doxycycline (VIBRA-TABS) 100 MG tablet Take 1 tablet (100 mg total) by mouth 2 (two) times daily.   escitalopram (LEXAPRO) 20 MG tablet Take 20 mg by mouth daily.   gabapentin (NEURONTIN) 300 MG capsule Take 600 mg by mouth 3 (three) times daily.    gabapentin (NEURONTIN) 300 MG capsule Take 2 capsules by mouth 3 (three)  times daily.   gentamicin cream (GARAMYCIN) 0.1 % Apply 1 application topically 2 (two) times daily.   Insulin Human (INSULIN PUMP) SOLN Inject 1 each into the skin continuous. Humalog - basal rate 3.2 Units/hr (Patient taking differently: Inject 1 each into the skin continuous. Novolog - basal rate 3.2 units/hr)   levothyroxine (SYNTHROID, LEVOTHROID) 125 MCG tablet Take 125 mcg by mouth daily.   levothyroxine (SYNTHROID, LEVOTHROID) 137 MCG tablet    losartan-hydrochlorothiazide (HYZAAR) 100-12.5 MG tablet Take 1 tablet by mouth daily.   NOVOLOG 100 UNIT/ML injection UTD INJ UP TO A MAX OF 122 UNITS [place] PER DAY VIA INSULIN PUMP   ondansetron (ZOFRAN) 4 MG tablet Take 1 tablet (4 mg total) by mouth every 6 (six) hours as needed for nausea.   OVER THE  COUNTER MEDICATION Pt uses CPAP machine daily.   oxyCODONE-acetaminophen (PERCOCET) 5-325 MG tablet Take 1 tablet by mouth every 6 (six) hours as needed for severe pain.   PAXLOVID, 300/100, 20 x 150 MG & 10 x 100MG  TBPK Take by mouth.   Probiotic CAPS Take 1 capsule by mouth daily.   silver sulfADIAZINE (SILVADENE) 1 % cream Apply 1 application topically daily.   No current facility-administered medications for this visit. (Other)      REVIEW OF SYSTEMS: ROS   Negative for: Constitutional, Gastrointestinal, Neurological, Skin, Genitourinary, Musculoskeletal, HENT, Endocrine, Cardiovascular, Eyes, Respiratory, Psychiatric, Allergic/Imm, Heme/Lymph Last edited by Hurman Horn, MD on 04/22/2021  9:54 AM.       ALLERGIES Allergies  Allergen Reactions   Penicillins Hives    Has patient had a PCN reaction causing immediate rash, facial/tongue/throat swelling, SOB or lightheadedness with hypotension: Unknown Has patient had a PCN reaction causing severe rash involving mucus membranes or skin necrosis: Unknown Has patient had a PCN reaction that required hospitalization: Unknown Has patient had a PCN reaction occurring within the last 10 years: No If all of the above answers are "NO", then may proceed with Cephalosporin use.    PAST MEDICAL HISTORY Past Medical History:  Diagnosis Date   Anxiety    Arthritis    knees   Coronary artery disease    Depression    Fatty liver    History of nuclear stress test 12/16/2011   exercise myoview; normal images with 2-19mm ST-segment depression - subsequent cath revelaed subtotally occluded small 2nd marginal branch & 75% PDA lesion, normal LV function   Hypertension    Hypothyroidism    Neuropathy    in feet   OSA on CPAP    AHI = 44 (per patient) setting 13 per pt   Personal history of kidney stones    Primary localized osteoarthritis of left knee 04/24/2015   Primary localized osteoarthritis of right knee 07/02/2015   Tobacco abuse     Type 2 diabetes mellitus (HCC)    insulin pump   Past Surgical History:  Procedure Laterality Date   3rd right toe amputation  02/2016   AMPUTATION Right 12/22/2014   Procedure: RIGHT 2ND TOE AMPUTATION;  Surgeon: [name], MD;  Location: [place];  Service: Orthopedics;  Laterality: Right;   AUGMENTATION MAMMAPLASTY Bilateral    BREAST ENHANCEMENT SURGERY Bilateral    CARDIAC CATHETERIZATION  01/04/2012   subtotally occluded small 2nd marginal branch & 75% PDA lesion, normal LV function   CATARACT EXTRACTION Bilateral    with lens implants   CHOLECYSTECTOMY  2008   COLONOSCOPY     COLONOSCOPY WITH PROPOFOL N/A 05/12/2016  Procedure: COLONOSCOPY WITH PROPOFOL;  Surgeon: Juanita Craver, MD;  Location: [facility];  Service: Endoscopy;  Laterality: N/A;   I & D KNEE WITH POLY EXCHANGE Left 05/13/2015   Procedure: IRRIGATION AND DEBRIDEMENT KNEE WITH POLY EXCHANGE;  Surgeon: Marchia Bond, MD;  Location: [place];  Service: Orthopedics;  Laterality: Left;   LEFT HEART CATHETERIZATION WITH CORONARY ANGIOGRAM N/A 01/04/2012   Procedure: LEFT HEART CATHETERIZATION WITH CORONARY ANGIOGRAM;  Surgeon: Lorretta Harp, MD;  Location: [facility] CATH LAB;  Service: Cardiovascular;  Laterality: N/A;   PARTIAL KNEE ARTHROPLASTY Left 04/24/2015   Procedure: LEFT UNICOMPARTMENTAL KNEE ARTHROPLASTY ;  Surgeon: Marchia Bond, MD;  Location: [place];  Service: Orthopedics;  Laterality: Left;   PARTIAL KNEE ARTHROPLASTY Right 07/02/2015   Procedure: RIGHT UNI KNEE ARTHROPLASTY;  Surgeon: Marchia Bond, MD;  Location: [place];  Service: Orthopedics;  Laterality: Right;  ANESTHESIA:  GENERAL, PRE/POST OP FEMORAL NERVE   POSTERIOR CERVICAL FUSION/FORAMINOTOMY  1990   ROBOTIC ASSISTED TOTAL HYSTERECTOMY WITH BILATERAL SALPINGO OOPHERECTOMY Bilateral 02/17/2015   Procedure: ROBOTIC ASSISTED TOTAL HYSTERECTOMY WITH BILATERAL SALPINGO OOPHORECTOMY;  Surgeon: Everitt Amber, MD;  Location: [facility];   Service: Gynecology;  Laterality: Bilateral;    FAMILY HISTORY Family History  Problem Relation Age of Onset   Stroke Mother    Hypertension Mother    Diabetes Mother    Alzheimer's disease Mother    Diabetes Father    COPD Father        vent-dependent, MODS   Hypertension Sister    Mental illness Sister        borderline personality d/o   Mental illness Sister        schizoeffective d/o   Diabetes Sister     SOCIAL HISTORY Social History   Tobacco Use   Smoking status: Former    Packs/day: 1.00    Years: 25.00    Pack years: 25.00    Types: Cigarettes   Smokeless tobacco: Never   Tobacco comments:    quit April 2021  Vaping Use   Vaping Use: Never used  Substance Use Topics   Alcohol use: No    Alcohol/week: 0.0 standard drinks   Drug use: No         OPHTHALMIC EXAM:  Base Eye Exam     Visual Acuity (ETDRS)       Right Left   Dist [name] 20/20 -2 20/25 +1         Tonometry (Tonopen, 9:37 AM)       Right Left   Pressure 16 16         Pupils       Dark Light Shape React APD   Right 4 3 Round Sluggish None   Left 4 3 Irregular Sluggish None         Visual Fields (Counting fingers)       Left Right    Full Full         Extraocular Movement       Right Left    Full Full         Neuro/Psych     Oriented x3: Yes   Mood/Affect: Normal         Dilation     Both eyes: 1.0% Mydriacyl, 2.5% Phenylephrine @ 9:37 AM           Slit Lamp and Fundus Exam     External Exam       Right Left  External Normal Normal         Slit Lamp Exam       Right Left   Lids/Lashes Normal Normal   Conjunctiva/Sclera White and quiet White and quiet   Cornea Clear Clear   Anterior Chamber Deep and quiet Deep and quiet   Iris Round and reactive Round and reactive   Lens Centered posterior chamber intraocular lens Centered posterior chamber intraocular lens   Anterior Vitreous Normal Normal         Fundus Exam       Right  Left   Posterior Vitreous Normal Normal   Disc Normal Normal   C/D Ratio 0.4 0.45   Macula Microaneurysms, Exudates Microaneurysms   Vessels NPDR-Severe NPDR-Severe   Periphery Normal Normal            IMAGING AND PROCEDURES  Imaging and Procedures for 04/22/21  OCT, Retina - OU - Both Eyes       Right Eye Quality was good. Scan locations included subfoveal. Central Foveal Thickness: 300. Progression has no prior data. Findings include abnormal foveal contour, cystoid macular edema.   Left Eye Quality was good. Scan locations included subfoveal. Central Foveal Thickness: 294. Progression has no prior data. Findings include vitreomacular adhesion , normal foveal contour.   Notes OD with focal CSME extra foveal, superotemporal to the FAZ.  Will need antivegF medication followed thereafter by focal to this region so as to decrease treatment burden       Color Fundus Photography Optos - OU - Both Eyes       Right Eye Progression has no prior data. Disc findings include normal observations. Macula : edema, exudates, microaneurysms. Vessels : normal observations. Periphery : normal observations.   Left Eye Progression has no prior data. Disc findings include normal observations. Macula : microaneurysms. Vessels : normal observations. Periphery : normal observations.   Notes CSME paracentral OD, will need therapy  OU with severe NPDR             ASSESSMENT/PLAN:  OSA on CPAP Patient reports excellent compliance with CPAP use  Severe nonproliferative diabetic retinopathy of left eye (HCC) Very severe NPDR, with each quadrant displaying evidence of intraretinal hemorrhages and dot blot hemorrhages, high risk for nonperfusion and progression to PDR.  Severe nonproliferative diabetic retinopathy of right eye, with macular edema, associated with type 2 diabetes mellitus ([place])  The nature of diabetic macular edema was discussed with the patient. Treatment options  were outlined including medical therapy, laser & vitrectomy. The use of injectable medications reviewed, including Avastin, Lucentis, and Eylea. Periodic injections into the eye are likely to resolve diabetic macular edema (swelling in the center of vision). Initially, injections are delivered are delivered every 4-6 weeks, and the interval extended as the condition improves. On average, 8-9 injections the first year, and 5 in year 2. Improvement in the condition most often improves on medical therapy. Occasional use of focal laser is also recommended for residual macular edema (swelling). Excellent control of blood glucose and blood pressure are encouraged under the care of a primary physician or endocrinologist. Similarly, attempts to maintain serum cholesterol, low density lipoproteins, and high-density lipoproteins in a favorable range were recommended.   The nature of severe nonproliferative diabetic retinopathy discussed with the patient as well as the need for more frequent follow up and likely progression to proliferative disease in the near future. The options of continued observation versus panretinal photocoagulation at this time were reviewed as well as the  risks, benefits, and alternatives. More recent option includes the use of ocular injectable medications to slow progression of retinal disease. Tight control of glucose, blood pressure, and serum lipid levels were recommended under the direction of general physician or endocrinologist, as well as avoidance of smoking and maintenance of normal body weight. The 2-year risk of progression to proliferative diabetic retinopathy is 60%.  Schedule return visit for fluorescein angiography right left, with likely intravitreal Avastin OD followed thereafter by focal laser treatment which will treat decrease her treatment burden  However she is likely need over time peripheral anterior PRP to based upon likely severe nonperfusion predicted by the clinical  findings today.      ICD-10-CM   1. Severe nonproliferative diabetic retinopathy of left eye without macular edema associated with type 2 diabetes mellitus (HCC)  U98.1191 OCT, Retina - OU - Both Eyes    Color Fundus Photography Optos - OU - Both Eyes    2. OSA on CPAP  G47.33    Z99.89     3. Severe nonproliferative diabetic retinopathy of right eye, with macular edema, associated with type 2 diabetes mellitus (HCC)  Y78.2956 Color Fundus Photography Optos - OU - Both Eyes      1.  Color fundus photography and findings reviewed with the patient so as to struct her as to the nature and the cause of diabetic retinopathy and the accumulation of damage over a lifetime of disease.  2.  OD and OS need follow-up carefully for severe NPDR particularly with fluorescein angiography to delineate the extent of the CSME OD, with likely treatment with injection into the right eye followed shortly thereafter by focal laser treatment so as to minimize ongoing treatment to the right eye.  3.  Further anterior peripheral PRP may be required in each eye should fluorescein angiography confirm my clinical suspicion of extensive retinal nonperfusion peripherally.  4.  Patient understands the critical portance of continued use of CPAP to minimize nightly low oxygen that can occur with untreated sleep apnea  Ophthalmic Meds Ordered this visit:  No orders of the defined types were placed in this encounter.      Return in about 1 week (around 04/29/2021) for DILATE OU, OPTOS FFA R/L, AVASTIN OCT, OD.  Patient Instructions  The nature of diabetic retinopathy was explained using the following analogy: "Retinopathy develops in the body's blood supply like salty water corrodes the linings of pipes in a house, until rust appears, then holes in the pipes develop which leak followed by destruction and loss of the pipes as the corrosion turns them to dust. In a similar fashion, Diabetes damages the blood supply of the  body by cumulative long--term elevated blood sugar, which corrodes the blood supply in the body, particularly the blood vessels supplying the retina, kidneys, and nerves.  Thus, control of blood sugar, slows the progression of the corrosive effect of diabetes mellitus.      Explained the diagnoses, plan, and follow up with the patient and they expressed understanding.  Patient expressed understanding of the importance of proper follow up care.   Clent Demark Delaine Canter M.D. Diseases & Surgery of the Retina and Vitreous Retina [facility] Diabetic [place] 04/22/21     Abbreviations: M myopia (nearsighted); A astigmatism; H hyperopia (farsighted); P presbyopia; Mrx spectacle prescription;  CTL contact lenses; OD right eye; OS left eye; OU both eyes  XT exotropia; ET esotropia; PEK punctate epithelial keratitis; PEE punctate epithelial erosions; DES dry eye syndrome; MGD meibomian  gland dysfunction; ATs artificial tears; PFAT's preservative free artificial tears; [place] nuclear sclerotic cataract; PSC posterior subcapsular cataract; ERM epi-retinal membrane; PVD posterior vitreous detachment; RD retinal detachment; DM diabetes mellitus; DR diabetic retinopathy; NPDR non-proliferative diabetic retinopathy; PDR proliferative diabetic retinopathy; CSME clinically significant macular edema; DME diabetic macular edema; dbh dot blot hemorrhages; CWS cotton wool spot; POAG primary open angle glaucoma; C/D cup-to-disc ratio; HVF humphrey visual field; GVF goldmann visual field; OCT optical coherence tomography; IOP intraocular pressure; BRVO Branch retinal vein occlusion; CRVO central retinal vein occlusion; CRAO central retinal artery occlusion; BRAO branch retinal artery occlusion; RT retinal tear; SB scleral buckle; PPV pars plana vitrectomy; VH Vitreous hemorrhage; PRP panretinal laser photocoagulation; IVK intravitreal kenalog; VMT vitreomacular traction; MH Macular hole;  NVD neovascularization of the disc; NVE  neovascularization elsewhere; AREDS age related eye disease study; ARMD age related macular degeneration; POAG primary open angle glaucoma; EBMD epithelial/anterior basement membrane dystrophy; ACIOL anterior chamber intraocular lens; IOL intraocular lens; PCIOL posterior chamber intraocular lens; Phaco/IOL phacoemulsification with intraocular lens placement; [place] photorefractive keratectomy; LASIK laser assisted in situ keratomileusis; HTN hypertension; DM diabetes mellitus; COPD chronic obstructive pulmonary disease

## 2021-04-27 ENCOUNTER — Encounter (INDEPENDENT_AMBULATORY_CARE_PROVIDER_SITE_OTHER): Payer: Medicare Other | Admitting: Ophthalmology

## 2021-04-27 ENCOUNTER — Other Ambulatory Visit: Payer: Self-pay

## 2021-05-03 ENCOUNTER — Ambulatory Visit (INDEPENDENT_AMBULATORY_CARE_PROVIDER_SITE_OTHER): Payer: Medicare Other | Admitting: Podiatry

## 2021-05-03 ENCOUNTER — Other Ambulatory Visit: Payer: Self-pay

## 2021-05-03 ENCOUNTER — Ambulatory Visit (INDEPENDENT_AMBULATORY_CARE_PROVIDER_SITE_OTHER): Payer: Medicare Other

## 2021-05-03 DIAGNOSIS — E0843 Diabetes mellitus due to underlying condition with diabetic autonomic (poly)neuropathy: Secondary | ICD-10-CM

## 2021-05-03 DIAGNOSIS — L97512 Non-pressure 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: Secondary | ICD-10-CM

## 2021-05-03 NOTE — Progress Notes (Signed)
? ?  Subjective:  ?66 y.o. female with PMHx of diabetes mellitus presenting today for follow-up evaluation of an ulcer to the plantar aspect of the first MTP right foot.  Patient states that she is doing well.  She wears her diabetic shoes.  No new complaints at this time ? ?Past Medical History:  ?Diagnosis Date  ? Anxiety   ? Arthritis   ? knees  ? Coronary artery disease   ? Depression   ? Fatty liver   ? History of nuclear stress test 12/16/2011  ? exercise myoview; normal images with 2-6m ST-segment depression - subsequent cath revelaed subtotally occluded small 2nd marginal branch & 75% PDA lesion, normal LV function  ? Hypertension   ? Hypothyroidism   ? Neuropathy   ? in feet  ? OSA on CPAP   ? AHI = 44 (per patient) setting 13 per pt  ? Personal history of kidney stones   ? Primary localized osteoarthritis of left knee 04/24/2015  ? Primary localized osteoarthritis of right knee 07/02/2015  ? Tobacco abuse   ? Type 2 diabetes mellitus ([place]   ? insulin pump  ? ?Objective/Physical Exam ?General: The patient is alert and oriented x3 in no acute distress. ? ?Dermatology:  ?Wound noted to the right plantar foot has healed.  Complete reepithelialization has occurred.  There is no open wound and no drainage or erythema to the area. ?Skin is warm, dry and supple bilateral lower extremities. ? ?Vascular: Palpable pedal pulses bilaterally. No edema or erythema noted. Capillary refill within normal limits. ? ?Neurological: Epicritic and protective threshold diminished bilaterally.  ? ?Musculoskeletal Exam: History of prior second and third toe amputations RT ? ?Radiographic exam RT foot 05/03/2021: No evidence of osteolytic erosion or cortical irregularity that would be concerning for osteomyelitis at the moment.  History of prior toe amputations at the MTP second and third toes.  Diffuse degenerative changes noted consistent with chronic osteoarthritis with possible Charcot degenerative changes of the midfoot given the  patient's history of diabetes mellitus ? ?Assessment: ?1.  Ulcer subfirst MTPJ right foot secondary to diabetes mellitus ?2. diabetes mellitus w/ peripheral neuropathy ?3.  Pain due to onychomycosis of toenails left ? ? ?Plan of Care:  ?1. Patient was evaluated. ?2.  The ulcer to the right great toe has resolved.  Complete reepithelialization has occurred.  There is some hyperkeratotic callus tissue around the freshly healed wound which was debrided today using a 312 scalpel without incident or bleeding. ?3.  Continue wearing diabetic shoes and insoles ?4.  Return to clinic in 3 months for routine foot care or sooner if needed ? ?*Husband's name is John. Pet dog is LullaBell ? ?BEdrick Kins DPM ?[place]? ?Dr. BEdrick Kins DPM  ?  ?[address]                                 ?[place] [id number]               ?Office [phone] ?Fax ([phone] ? ? ? ?

## 2021-05-04 ENCOUNTER — Encounter (INDEPENDENT_AMBULATORY_CARE_PROVIDER_SITE_OTHER): Payer: Self-pay | Admitting: Ophthalmology

## 2021-05-04 ENCOUNTER — Ambulatory Visit (INDEPENDENT_AMBULATORY_CARE_PROVIDER_SITE_OTHER): Payer: Medicare Other | Admitting: Ophthalmology

## 2021-05-04 DIAGNOSIS — E113492 Type 2 diabetes mellitus with severe nonproliferative diabetic retinopathy without macular edema, left eye: Secondary | ICD-10-CM

## 2021-05-04 DIAGNOSIS — E113411 Type 2 diabetes mellitus with severe nonproliferative diabetic retinopathy with macular edema, right eye: Secondary | ICD-10-CM | POA: Diagnosis not present

## 2021-05-04 MED ORDER — BEVACIZUMAB 2.5 MG/0.1ML IZ SOSY
2.5000 mg | PREFILLED_SYRINGE | INTRAVITREAL | Status: AC | PRN
  Administered 2021-05-04: 2.5 mg via INTRAVITREAL

## 2021-05-04 MED ORDER — FLUORESCEIN SODIUM 10 % IV SOLN
500.0000 mg | INTRAVENOUS | Status: AC | PRN
  Administered 2021-05-04: 500 mg via INTRAVENOUS

## 2021-05-04 NOTE — Progress Notes (Signed)
05/04/2021     CHIEF COMPLAINT Patient presents for  Chief Complaint  Patient presents with   Diabetic Retinopathy without Macular Edema      HISTORY OF PRESENT ILLNESS: [name] is a 66 y.o. female who presents to the clinic today for:   HPI   1 week Dilate OU, Optos FFA R/L, Avastin OCT, OD.  Patient states vision is stable and unchanged since last visit. Denies any new floaters or FOL.  Last edited by Nelva Nay on 05/04/2021  3:40 PM.      Referring physician: Geoffry Paradise, MD [address] [place],  [place] [address]  HISTORICAL INFORMATION:   Selected notes from the MEDICAL RECORD NUMBER    Lab Results  Component Value Date   HGBA1C 8.0 (H) 11/13/2016     CURRENT MEDICATIONS: No current outpatient medications on file. (Ophthalmic Drugs)   No current facility-administered medications for this visit. (Ophthalmic Drugs)   Current Outpatient Medications (Other)  Medication Sig   ACCU-CHEK AVIVA PLUS test strip    ALPRAZolam (XANAX) 0.5 MG tablet Take 0.5 mg by mouth at bedtime as needed for anxiety.    alum & mag hydroxide-simeth (MAALOX/MYLANTA) 200-200-20 MG/5ML suspension Take 15 mLs by mouth every 4 (four) hours as needed for indigestion or heartburn.   atorvastatin (LIPITOR) 20 MG tablet Take 20 mg by mouth daily.   atorvastatin (LIPITOR) 20 MG tablet Take 1 tablet by mouth daily.   buPROPion (WELLBUTRIN SR) 100 MG 12 hr tablet Take 100 mg by mouth every 12 (twelve) hours.   buPROPion (WELLBUTRIN XL) 150 MG 24 hr tablet TAKE 1 TABLET BY MOUTH EVERY DAY IN THE MORNING   cephALEXin (KEFLEX) 500 MG capsule Take 1 capsule (500 mg total) by mouth 4 (four) times daily.   Cholecalciferol (VITAMIN D PO) Take by mouth.   clindamycin (CLEOCIN) 300 MG capsule Take 1 capsule (300 mg total) by mouth 3 (three) times daily.   clotrimazole-betamethasone (LOTRISONE) cream Apply 1 application topically 2 (two) times daily.   Continuous Blood Gluc  Receiver (FREESTYLE LIBRE 14 DAY READER) DEVI Use to monitor blood glucose   Continuous Blood Gluc Sensor (FREESTYLE LIBRE 14 DAY SENSOR) MISC Use to monitor blood glucose continuously-Dx code E11.29   doxycycline (VIBRA-TABS) 100 MG tablet Take 1 tablet (100 mg total) by mouth 2 (two) times daily.   escitalopram (LEXAPRO) 20 MG tablet Take 20 mg by mouth daily.   gabapentin (NEURONTIN) 300 MG capsule Take 600 mg by mouth 3 (three) times daily.    gabapentin (NEURONTIN) 300 MG capsule Take 2 capsules by mouth 3 (three) times daily.   gentamicin cream (GARAMYCIN) 0.1 % Apply 1 application topically 2 (two) times daily.   Insulin Human (INSULIN PUMP) SOLN Inject 1 each into the skin continuous. Humalog - basal rate 3.2 Units/hr (Patient taking differently: Inject 1 each into the skin continuous. Novolog - basal rate 3.2 units/hr)   levothyroxine (SYNTHROID, LEVOTHROID) 125 MCG tablet Take 125 mcg by mouth daily.   levothyroxine (SYNTHROID, LEVOTHROID) 137 MCG tablet    losartan-hydrochlorothiazide (HYZAAR) 100-12.5 MG tablet Take 1 tablet by mouth daily.   NOVOLOG 100 UNIT/ML injection UTD INJ UP TO A MAX OF 122 UNITS [place] PER DAY VIA INSULIN PUMP   ondansetron (ZOFRAN) 4 MG tablet Take 1 tablet (4 mg total) by mouth every 6 (six) hours as needed for nausea.   OVER THE COUNTER MEDICATION Pt uses CPAP machine daily.   oxyCODONE-acetaminophen (PERCOCET) 5-325 MG  tablet Take 1 tablet by mouth every 6 (six) hours as needed for severe pain.   PAXLOVID, 300/100, 20 x 150 MG & 10 x 100MG  TBPK Take by mouth.   Probiotic CAPS Take 1 capsule by mouth daily.   silver sulfADIAZINE (SILVADENE) 1 % cream Apply 1 application topically daily.   No current facility-administered medications for this visit. (Other)      REVIEW OF SYSTEMS: ROS   Negative for: Constitutional, Gastrointestinal, Neurological, Skin, Genitourinary, Musculoskeletal, HENT, Endocrine, Cardiovascular, Eyes, Respiratory, Psychiatric,  Allergic/Imm, Heme/Lymph Last edited by Edmon Crape, MD on 05/04/2021  4:47 PM.       ALLERGIES Allergies  Allergen Reactions   Penicillins Hives    Has patient had a PCN reaction causing immediate rash, facial/tongue/throat swelling, SOB or lightheadedness with hypotension: Unknown Has patient had a PCN reaction causing severe rash involving mucus membranes or skin necrosis: Unknown Has patient had a PCN reaction that required hospitalization: Unknown Has patient had a PCN reaction occurring within the last 10 years: No If all of the above answers are "NO", then may proceed with Cephalosporin use.    PAST MEDICAL HISTORY Past Medical History:  Diagnosis Date   Anxiety    Arthritis    knees   Coronary artery disease    Depression    Fatty liver    History of nuclear stress test 12/16/2011   exercise myoview; normal images with 2-57mm ST-segment depression - subsequent cath revelaed subtotally occluded small 2nd marginal branch & 75% PDA lesion, normal LV function   Hypertension    Hypothyroidism    Neuropathy    in feet   OSA on CPAP    AHI = 44 (per patient) setting 13 per pt   Personal history of kidney stones    Primary localized osteoarthritis of left knee 04/24/2015   Primary localized osteoarthritis of right knee 07/02/2015   Tobacco abuse    Type 2 diabetes mellitus (HCC)    insulin pump   Past Surgical History:  Procedure Laterality Date   3rd right toe amputation  02/2016   AMPUTATION Right 12/22/2014   Procedure: RIGHT 2ND TOE AMPUTATION;  Surgeon: Toni Arthurs, MD;  [facility];  Service: Orthopedics;  Laterality: Right;   AUGMENTATION MAMMAPLASTY Bilateral    BREAST ENHANCEMENT SURGERY Bilateral    CARDIAC CATHETERIZATION  01/04/2012   subtotally occluded small 2nd marginal branch & 75% PDA lesion, normal LV function   CATARACT EXTRACTION Bilateral    with lens implants   CHOLECYSTECTOMY  2008   COLONOSCOPY     COLONOSCOPY WITH PROPOFOL N/A 05/12/2016    Procedure: COLONOSCOPY WITH PROPOFOL;  Surgeon: Charna Elizabeth, MD;  Location: [facility];  Service: Endoscopy;  Laterality: N/A;   I & D KNEE WITH POLY EXCHANGE Left 05/13/2015   Procedure: IRRIGATION AND DEBRIDEMENT KNEE WITH POLY EXCHANGE;  Surgeon: Teryl Lucy, MD;  [facility];  Service: Orthopedics;  Laterality: Left;   LEFT HEART CATHETERIZATION WITH CORONARY ANGIOGRAM N/A 01/04/2012   Procedure: LEFT HEART CATHETERIZATION WITH CORONARY ANGIOGRAM;  Surgeon: Runell Gess, MD;  Location: [facility] CATH LAB;  Service: Cardiovascular;  Laterality: N/A;   PARTIAL KNEE ARTHROPLASTY Left 04/24/2015   Procedure: LEFT UNICOMPARTMENTAL KNEE ARTHROPLASTY ;  Surgeon: Teryl Lucy, MD;  Location: [facility];  Service: Orthopedics;  Laterality: Left;   PARTIAL KNEE ARTHROPLASTY Right 07/02/2015   Procedure: RIGHT UNI KNEE ARTHROPLASTY;  Surgeon: Teryl Lucy, MD;  Location: [facility];  Service:  Orthopedics;  Laterality: Right;  ANESTHESIA:  GENERAL, PRE/POST OP FEMORAL NERVE   POSTERIOR CERVICAL FUSION/FORAMINOTOMY  1990   ROBOTIC ASSISTED TOTAL HYSTERECTOMY WITH BILATERAL SALPINGO OOPHERECTOMY Bilateral 02/17/2015   Procedure: ROBOTIC ASSISTED TOTAL HYSTERECTOMY WITH BILATERAL SALPINGO OOPHORECTOMY;  Surgeon: Adolphus Birchwood, MD;  [facility];  Service: Gynecology;  Laterality: Bilateral;    FAMILY HISTORY Family History  Problem Relation Age of Onset   Stroke Mother    Hypertension Mother    Diabetes Mother    Alzheimer's disease Mother    Diabetes Father    COPD Father        vent-dependent, MODS   Hypertension Sister    Mental illness Sister        borderline personality d/o   Mental illness Sister        schizoeffective d/o   Diabetes Sister     SOCIAL HISTORY Social History   Tobacco Use   Smoking status: Former    Packs/day: 1.00    Years: 25.00    Pack years: 25.00    Types: Cigarettes   Smokeless tobacco: Never   Tobacco comments:    quit  April 2021  Vaping Use   Vaping Use: Never used  Substance Use Topics   Alcohol use: No    Alcohol/week: 0.0 standard drinks   Drug use: No         OPHTHALMIC EXAM:  Base Eye Exam     Visual Acuity (ETDRS)       Right Left   Dist [place] 20/20 20/30 +2         Tonometry (Tonopen, 3:41 PM)       Right Left   Pressure 17 15         Pupils       Pupils APD   Right PERRL None   Left PERRL None         Visual Fields (Counting fingers)       Left Right    Full Full         Extraocular Movement       Right Left    Full Full         Neuro/Psych     Oriented x3: Yes   Mood/Affect: Normal         Dilation     Both eyes: 1.0% Mydriacyl, 2.5% Phenylephrine @ 3:41 PM           Slit Lamp and Fundus Exam     External Exam       Right Left   External Normal Normal         Slit Lamp Exam       Right Left   Lids/Lashes Normal Normal   Conjunctiva/Sclera White and quiet White and quiet   Cornea Clear Clear   Anterior Chamber Deep and quiet Deep and quiet   Iris Round and reactive Round and reactive   Lens Centered posterior chamber intraocular lens Centered posterior chamber intraocular lens   Anterior Vitreous Normal Normal         Fundus Exam       Right Left   Posterior Vitreous Normal Normal   Disc Normal Normal   C/D Ratio 0.4 0.45   Macula Microaneurysms, Exudates, Macular thickening Microaneurysms   Vessels NPDR-Severe NPDR-Severe   Periphery Normal Normal            IMAGING AND PROCEDURES  Imaging and Procedures for 05/04/21  Intravitreal Injection, Pharmacologic Agent - OD -  Right Eye       Time Out 05/04/2021. 4:49 PM. Confirmed correct patient, procedure, site, and patient consented.   Anesthesia Topical anesthesia was used. Anesthetic medications included Lidocaine 4%.   Procedure Preparation included 5% betadine to ocular surface, 10% betadine to eyelids, Tobramycin 0.3%. A 30 gauge needle was used.    Injection: 2.5 mg bevacizumab 2.5 MG/0.1ML   Route: Intravitreal, Site: Right Eye   NDC: [phone], Lot: [id number]   Post-op Post injection exam found visual acuity of at least counting fingers. The patient tolerated the procedure well. There were no complications. The patient received written and verbal post procedure care education. Post injection medications included ocuflox.      OCT, Retina - OU - Both Eyes       Right Eye Quality was good. Scan locations included subfoveal. Central Foveal Thickness: 299. Progression has been stable. Findings include cystoid macular edema.   Left Eye Quality was good. Scan locations included subfoveal. Central Foveal Thickness: 295. Progression has been stable. Findings include normal foveal contour.      Color Fundus Photography Optos - OU - Both Eyes       Right Eye Progression has no prior data. Disc findings include normal observations. Macula : edema, exudates, microaneurysms. Vessels : normal observations. Periphery : normal observations.   Left Eye Progression has no prior data. Disc findings include normal observations. Macula : microaneurysms. Vessels : normal observations. Periphery : normal observations.   Notes CSME paracentral OD, will need therapy  OU with severe NPDR     Fluorescein Angiography Optos (Transit OD)       Injection: 500 mg Fluorescein Sodium 10 %   Route: Intravenous   NDC: [id number]   Right Eye   Progression has no prior data. Early phase findings include microaneurysm. Mid/Late phase findings include microaneurysm, vascular perfusion defect. Choroidal neovascularization is not present.   Left Eye   Progression has no prior data. Mid/Late phase findings include microaneurysm, vascular perfusion defect. Choroidal neovascularization is not present.   Notes Retinal nonperfusion temporally anterior as well as some nasally.  CSME confirmed OD  OS, minor leakages in the perifoveal region  but not CSME but areas of retinal nonperfusion temporally.             ASSESSMENT/PLAN:  Severe nonproliferative diabetic retinopathy of right eye, with macular edema, associated with type 2 diabetes mellitus (HCC) OD, CSME present, will treat to also halt severe NPDR so as to allow for peripheral anterior PRP in the near future  Severe nonproliferative diabetic retinopathy of left eye (HCC) FFA form confirms severe NPDR retinal nonperfusion anteriorly, will need PRP in the future      ICD-10-CM   1. Severe nonproliferative diabetic retinopathy of right eye, with macular edema, associated with type 2 diabetes mellitus (HCC)  E11.3411 Intravitreal Injection, Pharmacologic Agent - OD - Right Eye    OCT, Retina - OU - Both Eyes    Color Fundus Photography Optos - OU - Both Eyes    Fluorescein Angiography Optos (Transit OD)    Fluorescein Sodium 10 % injection 500 mg    bevacizumab (AVASTIN) SOSY 2.5 mg    2. Severe nonproliferative diabetic retinopathy of left eye without macular edema associated with type 2 diabetes mellitus (HCC)  Y86.5784       1.  Severe NPDR with CSME confirmed OD, will deliver intravitreal Avastin now so as to prevent progression of NPDR as well as protect the macula  2.  Follow-up in 2 to 3 weeks to deliver peripheral anterior PRP in the right eye  3.  Instead of peripheral anterior PRP in the left eye in the coming weeks as well  Ophthalmic Meds Ordered this visit:  Meds ordered this encounter  Medications   Fluorescein Sodium 10 % injection 500 mg   bevacizumab (AVASTIN) SOSY 2.5 mg       Return in about 2 weeks (around 05/18/2021) for dilate, OD, PRP,, peripheral anterior temporally with some nasal.  There are no Patient Instructions on file for this visit.   Explained the diagnoses, plan, and follow up with the patient and they expressed understanding.  Patient expressed understanding of the importance of proper follow up care.   Alford Highland  Aine Strycharz M.D. Diseases & Surgery of the Retina and Vitreous [facility] 05/04/21     Abbreviations: M myopia (nearsighted); A astigmatism; H hyperopia (farsighted); P presbyopia; Mrx spectacle prescription;  CTL contact lenses; OD right eye; OS left eye; OU both eyes  XT exotropia; ET esotropia; PEK punctate epithelial keratitis; PEE punctate epithelial erosions; DES dry eye syndrome; MGD meibomian gland dysfunction; ATs artificial tears; PFAT's preservative free artificial tears; NSC nuclear sclerotic cataract; PSC posterior subcapsular cataract; ERM epi-retinal membrane; PVD posterior vitreous detachment; RD retinal detachment; DM diabetes mellitus; DR diabetic retinopathy; NPDR non-proliferative diabetic retinopathy; PDR proliferative diabetic retinopathy; CSME clinically significant macular edema; DME diabetic macular edema; dbh dot blot hemorrhages; CWS cotton wool spot; POAG primary open angle glaucoma; C/D cup-to-disc ratio; HVF humphrey visual field; GVF goldmann visual field; OCT optical coherence tomography; IOP intraocular pressure; BRVO Branch retinal vein occlusion; CRVO central retinal vein occlusion; CRAO central retinal artery occlusion; BRAO branch retinal artery occlusion; RT retinal tear; SB scleral buckle; PPV pars plana vitrectomy; VH Vitreous hemorrhage; PRP panretinal laser photocoagulation; IVK intravitreal kenalog; VMT vitreomacular traction; MH Macular hole;  NVD neovascularization of the disc; NVE neovascularization elsewhere; AREDS age related eye disease study; ARMD age related macular degeneration; POAG primary open angle glaucoma; EBMD epithelial/anterior basement membrane dystrophy; ACIOL anterior chamber intraocular lens; IOL intraocular lens; PCIOL posterior chamber intraocular lens; Phaco/IOL phacoemulsification with intraocular lens placement; PRK photorefractive keratectomy; LASIK laser assisted in situ keratomileusis; HTN hypertension; DM diabetes  mellitus; COPD chronic obstructive pulmonary disease

## 2021-05-04 NOTE — Assessment & Plan Note (Signed)
FFA form confirms severe NPDR retinal nonperfusion anteriorly, will need PRP in the future ?

## 2021-05-04 NOTE — Assessment & Plan Note (Signed)
OD, CSME present, will treat to also halt severe NPDR so as to allow for peripheral anterior PRP in the near future ?

## 2021-05-18 ENCOUNTER — Encounter (INDEPENDENT_AMBULATORY_CARE_PROVIDER_SITE_OTHER): Payer: Self-pay | Admitting: Ophthalmology

## 2021-05-18 ENCOUNTER — Ambulatory Visit (INDEPENDENT_AMBULATORY_CARE_PROVIDER_SITE_OTHER): Payer: Medicare Other | Admitting: Ophthalmology

## 2021-05-18 ENCOUNTER — Other Ambulatory Visit: Payer: Self-pay

## 2021-05-18 DIAGNOSIS — E113492 Type 2 diabetes mellitus with severe nonproliferative diabetic retinopathy without macular edema, left eye: Secondary | ICD-10-CM

## 2021-05-18 DIAGNOSIS — E113411 Type 2 diabetes mellitus with severe nonproliferative diabetic retinopathy with macular edema, right eye: Secondary | ICD-10-CM

## 2021-05-18 NOTE — Assessment & Plan Note (Signed)
Stable OS will observe closely ?

## 2021-05-18 NOTE — Assessment & Plan Note (Signed)
OD, PRP #1 nasal and inferior completed today ?

## 2021-05-18 NOTE — Progress Notes (Signed)
? ? ?05/18/2021 ? ?  ? ?CHIEF COMPLAINT ?Patient presents for  ?Chief Complaint  ?Patient presents with  ? Diabetic Retinopathy with Macular Edema  ? ? ? ? ?HISTORY OF PRESENT ILLNESS: ?[name] is a 66 y.o. female who presents to the clinic today for:  ? ?HPI   ?2 weeks Dilate OD, PRP, peripheral anterior temporally with some nasal. ?Pt states "I think my vision has improved since my last visit."  ?Last edited by Laurin Coder on 05/18/2021  2:35 PM.  ?  ? ? ?Referring physician: ?Burnard Bunting, MD ?[address] ?[place],   [address] ? ?HISTORICAL INFORMATION:  ? ?Selected notes from the [facility] ?  ? ?Lab Results  ?Component Value Date  ? HGBA1C 8.0 (H) 11/13/2016  ?  ? ?CURRENT MEDICATIONS: ?No current outpatient medications on file. (Ophthalmic Drugs)  ? ?No current facility-administered medications for this visit. (Ophthalmic Drugs)  ? ?Current Outpatient Medications (Other)  ?Medication Sig  ? ACCU-CHEK AVIVA PLUS test strip   ? ALPRAZolam (XANAX) 0.5 MG tablet Take 0.5 mg by mouth at bedtime as needed for anxiety.   ? alum & mag hydroxide-simeth (MAALOX/MYLANTA) 200-200-20 MG/5ML suspension Take 15 mLs by mouth every 4 (four) hours as needed for indigestion or heartburn.  ? atorvastatin (LIPITOR) 20 MG tablet Take 20 mg by mouth daily.  ? atorvastatin (LIPITOR) 20 MG tablet Take 1 tablet by mouth daily.  ? buPROPion (WELLBUTRIN SR) 100 MG 12 hr tablet Take 100 mg by mouth every 12 (twelve) hours.  ? buPROPion (WELLBUTRIN XL) 150 MG 24 hr tablet TAKE 1 TABLET BY MOUTH EVERY DAY IN THE MORNING  ? cephALEXin (KEFLEX) 500 MG capsule Take 1 capsule (500 mg total) by mouth 4 (four) times daily.  ? Cholecalciferol (VITAMIN D PO) Take by mouth.  ? clindamycin (CLEOCIN) 300 MG capsule Take 1 capsule (300 mg total) by mouth 3 (three) times daily.  ? clotrimazole-betamethasone (LOTRISONE) cream Apply 1 application topically 2 (two) times daily.  ? Continuous Blood Gluc Receiver (FREESTYLE  LIBRE 14 DAY READER) DEVI Use to monitor blood glucose  ? Continuous Blood Gluc Sensor (FREESTYLE LIBRE 14 DAY SENSOR) MISC Use to monitor blood glucose continuously-Dx code E11.29  ? doxycycline (VIBRA-TABS) 100 MG tablet Take 1 tablet (100 mg total) by mouth 2 (two) times daily.  ? escitalopram (LEXAPRO) 20 MG tablet Take 20 mg by mouth daily.  ? gabapentin (NEURONTIN) 300 MG capsule Take 600 mg by mouth 3 (three) times daily.   ? gabapentin (NEURONTIN) 300 MG capsule Take 2 capsules by mouth 3 (three) times daily.  ? gentamicin cream (GARAMYCIN) 0.1 % Apply 1 application topically 2 (two) times daily.  ? Insulin Human (INSULIN PUMP) SOLN Inject 1 each into the skin continuous. Humalog - basal rate 3.2 Units/hr (Patient taking differently: Inject 1 each into the skin continuous. Novolog - basal rate 3.2 units/hr)  ? levothyroxine (SYNTHROID, LEVOTHROID) 125 MCG tablet Take 125 mcg by mouth daily.  ? levothyroxine (SYNTHROID, LEVOTHROID) 137 MCG tablet   ? losartan-hydrochlorothiazide (HYZAAR) 100-12.5 MG tablet Take 1 tablet by mouth daily.  ? NOVOLOG 100 UNIT/ML injection UTD INJ UP TO A MAX OF 122 UNITS [place] PER DAY VIA INSULIN PUMP  ? ondansetron (ZOFRAN) 4 MG tablet Take 1 tablet (4 mg total) by mouth every 6 (six) hours as needed for nausea.  ? OVER THE COUNTER MEDICATION Pt uses CPAP machine daily.  ? oxyCODONE-acetaminophen (PERCOCET) 5-325 MG tablet Take 1 tablet  by mouth every 6 (six) hours as needed for severe pain.  ? PAXLOVID, 300/100, 20 x 150 MG & 10 x '100MG'$  TBPK Take by mouth.  ? Probiotic CAPS Take 1 capsule by mouth daily.  ? silver sulfADIAZINE (SILVADENE) 1 % cream Apply 1 application topically daily.  ? ?No current facility-administered medications for this visit. (Other)  ? ? ? ? ?REVIEW OF SYSTEMS: ? ? ? ?ALLERGIES ?Allergies  ?Allergen Reactions  ? Penicillins Hives  ?  Has patient had a PCN reaction causing immediate rash, facial/tongue/throat swelling, SOB or lightheadedness with  hypotension: Unknown ?Has patient had a PCN reaction causing severe rash involving mucus membranes or skin necrosis: Unknown ?Has patient had a PCN reaction that required hospitalization: Unknown ?Has patient had a PCN reaction occurring within the last 10 years: No ?If all of the above answers are "NO", then may proceed with Cephalosporin use.  ? ? ?PAST MEDICAL HISTORY ?Past Medical History:  ?Diagnosis Date  ? Anxiety   ? Arthritis   ? knees  ? Coronary artery disease   ? Depression   ? Fatty liver   ? History of nuclear stress test 12/16/2011  ? exercise myoview; normal images with 2-76m ST-segment depression - subsequent cath revelaed subtotally occluded small 2nd marginal branch & 75% PDA lesion, normal LV function  ? Hypertension   ? Hypothyroidism   ? Neuropathy   ? in feet  ? OSA on CPAP   ? AHI = 44 (per patient) setting 13 per pt  ? Personal history of kidney stones   ? Primary localized osteoarthritis of left knee 04/24/2015  ? Primary localized osteoarthritis of right knee 07/02/2015  ? Tobacco abuse   ? [place]   ? insulin pump  ? ?Past Surgical History:  ?Procedure Laterality Date  ? 3rd right toe amputation  02/2016  ? AMPUTATION Right 12/22/2014  ? Procedure: RIGHT 2ND TOE AMPUTATION;  Surgeon: JWylene Simmer MD;  [facility]  Service: [facility]opedics;  Laterality: Right;  ? AUGMENTATION MAMMAPLASTY Bilateral   ? BREAST ENHANCEMENT SURGERY Bilateral   ? CARDIAC CATHETERIZATION  01/04/2012  ? subtotally occluded small 2nd marginal branch & 75% PDA lesion, normal LV function  ? CATARACT EXTRACTION Bilateral   ? with lens implants  ? CHOLECYSTECTOMY  2008  ? COLONOSCOPY    ? COLONOSCOPY WITH PROPOFOL N/A 05/12/2016  ? Procedure: COLONOSCOPY WITH PROPOFOL;  Surgeon: JJuanita Craver MD;  [facility];  Service: Endoscopy;  Laterality: N/A;  ? I & D KNEE WITH POLY EXCHANGE Left 05/13/2015  ? Procedure: IRRIGATION AND DEBRIDEMENT KNEE WITH POLY EXCHANGE;  Surgeon: JMarchia Bond MD;   [facility]  Service: Orthopedics;  Laterality: Left;  ? LEFT HEART CATHETERIZATION WITH CORONARY ANGIOGRAM N/A 01/04/2012  ? Procedure: LEFT HEART CATHETERIZATION WITH CORONARY ANGIOGRAM;  Surgeon: JLorretta Harp MD;  [facility];  Service: Cardiovascular;  Laterality: N/A;  ? PARTIAL KNEE ARTHROPLASTY Left 04/24/2015  ? Procedure: LEFT UNICOMPARTMENTAL KNEE ARTHROPLASTY ;  Surgeon: JMarchia Bond MD;  [facility] [facility]  Service: [place];  Laterality: Left;  ? PARTIAL KNEE ARTHROPLASTY Right 07/02/2015  ? Procedure: RIGHT UNI KNEE ARTHROPLASTY;  Surgeon: JMarchia Bond MD;  [facility]  Service: Orthopedics;  Laterality: Right;  ANESTHESIA:  GENERAL, PRE/POST OP FEMORAL NERVE  ? POSTERIOR CERVICAL FUSION/FORAMINOTOMY  1990  ? ROBOTIC ASSISTED TOTAL HYSTERECTOMY WITH BILATERAL SALPINGO OOPHERECTOMY Bilateral 02/17/2015  ? Procedure: ROBOTIC ASSISTED TOTAL HYSTERECTOMY  WITH BILATERAL SALPINGO OOPHORECTOMY;  Surgeon: Everitt Amber, MD;  [facility];  Service: Gynecology;  Laterality: Bilateral;  ? ? ?FAMILY HISTORY ?Family History  ?Problem Relation Age of Onset  ? Stroke Mother   ? Hypertension Mother   ? Diabetes Mother   ? Alzheimer's disease Mother   ? Diabetes Father   ? COPD Father   ?     vent-dependent, MODS  ? Hypertension Sister   ? Mental illness Sister   ?     borderline personality d/o  ? Mental illness Sister   ?     schizoeffective d/o  ? Diabetes Sister   ? ? ?SOCIAL HISTORY ?Social History  ? ?Tobacco Use  ? Smoking status: Former  ?  Packs/day: 1.00  ?  Years: 25.00  ?  Pack years: 25.00  ?  Types: Cigarettes  ? Smokeless tobacco: Never  ? Tobacco comments:  ?  quit April 2021  ?Vaping Use  ? Vaping Use: Never used  ?Substance Use Topics  ? Alcohol use: No  ?  Alcohol/week: 0.0 standard drinks  ? Drug use: No  ? ?  ? ?  ? ?OPHTHALMIC EXAM: ? ?Base Eye Exam   ? ? Visual Acuity (ETDRS)   ? ?   Right Left  ? Dist [place] 20/20 -1 20/30 -2  ? ?   ?  ? ? Tonometry (Tonopen, 2:32 PM)   ? ?   Right Left  ? Pressure 14 15  ? ?  ?  ? ? Pupils   ? ?   Pupils Dark Light APD  ? Right PERRL 4 3 None  ? Left PERRL 4 3 None  ? ?  ?  ? ? Visual Fields (Counting fi

## 2021-05-25 ENCOUNTER — Telehealth: Payer: Self-pay

## 2021-05-25 DIAGNOSIS — G629 Polyneuropathy, unspecified: Secondary | ICD-10-CM | POA: Diagnosis not present

## 2021-05-25 DIAGNOSIS — R197 Diarrhea, unspecified: Secondary | ICD-10-CM | POA: Diagnosis not present

## 2021-05-25 DIAGNOSIS — E1129 Type 2 diabetes mellitus with other diabetic kidney complication: Secondary | ICD-10-CM | POA: Diagnosis not present

## 2021-05-25 DIAGNOSIS — Z4681 Encounter for fitting and adjustment of insulin pump: Secondary | ICD-10-CM | POA: Diagnosis not present

## 2021-05-25 DIAGNOSIS — F329 Major depressive disorder, single episode, unspecified: Secondary | ICD-10-CM | POA: Diagnosis not present

## 2021-05-25 DIAGNOSIS — Z794 Long term (current) use of insulin: Secondary | ICD-10-CM | POA: Diagnosis not present

## 2021-05-25 DIAGNOSIS — I10 Essential (primary) hypertension: Secondary | ICD-10-CM | POA: Diagnosis not present

## 2021-05-25 DIAGNOSIS — E785 Hyperlipidemia, unspecified: Secondary | ICD-10-CM | POA: Diagnosis not present

## 2021-05-25 NOTE — Telephone Encounter (Signed)
Shoes ordered - [employer] - [place] No-Tie - Blue - [phone] [id number] ?

## 2021-06-22 ENCOUNTER — Ambulatory Visit (INDEPENDENT_AMBULATORY_CARE_PROVIDER_SITE_OTHER): Payer: Medicare Other | Admitting: Ophthalmology

## 2021-06-22 ENCOUNTER — Encounter (INDEPENDENT_AMBULATORY_CARE_PROVIDER_SITE_OTHER): Payer: Medicare Other | Admitting: Ophthalmology

## 2021-06-22 DIAGNOSIS — E113411 Type 2 diabetes mellitus with severe nonproliferative diabetic retinopathy with macular edema, right eye: Secondary | ICD-10-CM

## 2021-06-22 DIAGNOSIS — E113412 Type 2 diabetes mellitus with severe nonproliferative diabetic retinopathy with macular edema, left eye: Secondary | ICD-10-CM

## 2021-06-22 NOTE — Progress Notes (Signed)
? ? ?06/22/2021 ? ?  ? ?CHIEF COMPLAINT ?Patient presents for  ?Chief Complaint  ?Patient presents with  ? Diabetic Retinopathy with Macular Edema  ? ? ? ? ?HISTORY OF PRESENT ILLNESS: ?[name] is a 66 y.o. female who presents to the clinic today for:  ? ?HPI   ?5 weeks for DILATE OU, OCT. ?Pt stated vision has improved and can see more clearly.  ?Pt denies floaters and FOL. ?New a1C is 6.3 two weeks ago. ? ?Last edited by Silvestre Moment on 06/22/2021  1:49 PM.  ?  ? ? ?Referring physician: ?Burnard Bunting, MD ?[address] ?[place],  [place] [address] ? ?HISTORICAL INFORMATION:  ? ?Selected notes from the [facility] ?  ? ?Lab Results  ?Component Value Date  ? HGBA1C 8.0 (H) 11/13/2016  ?  ? ?CURRENT MEDICATIONS: ?No current outpatient medications on file. (Ophthalmic Drugs)  ? ?No current facility-administered medications for this visit. (Ophthalmic Drugs)  ? ?Current Outpatient Medications (Other)  ?Medication Sig  ? ACCU-CHEK AVIVA PLUS test strip   ? ALPRAZolam (XANAX) 0.5 MG tablet Take 0.5 mg by mouth at bedtime as needed for anxiety.   ? alum & mag hydroxide-simeth (MAALOX/MYLANTA) 200-200-20 MG/5ML suspension Take 15 mLs by mouth every 4 (four) hours as needed for indigestion or heartburn.  ? atorvastatin (LIPITOR) 20 MG tablet Take 20 mg by mouth daily.  ? atorvastatin (LIPITOR) 20 MG tablet Take 1 tablet by mouth daily.  ? buPROPion (WELLBUTRIN SR) 100 MG 12 hr tablet Take 100 mg by mouth every 12 (twelve) hours.  ? buPROPion (WELLBUTRIN XL) 150 MG 24 hr tablet TAKE 1 TABLET BY MOUTH EVERY DAY IN THE MORNING  ? cephALEXin (KEFLEX) 500 MG capsule Take 1 capsule (500 mg total) by mouth 4 (four) times daily.  ? Cholecalciferol (VITAMIN D PO) Take by mouth.  ? clindamycin (CLEOCIN) 300 MG capsule Take 1 capsule (300 mg total) by mouth 3 (three) times daily.  ? clotrimazole-betamethasone (LOTRISONE) cream Apply 1 application topically 2 (two) times daily.  ? Continuous Blood Gluc Receiver (FREESTYLE  LIBRE 14 DAY READER) DEVI Use to monitor blood glucose  ? Continuous Blood Gluc Sensor (FREESTYLE LIBRE 14 DAY SENSOR) MISC Use to monitor blood glucose continuously-Dx code E11.29  ? doxycycline (VIBRA-TABS) 100 MG tablet Take 1 tablet (100 mg total) by mouth 2 (two) times daily.  ? escitalopram (LEXAPRO) 20 MG tablet Take 20 mg by mouth daily.  ? gabapentin (NEURONTIN) 300 MG capsule Take 600 mg by mouth 3 (three) times daily.   ? gabapentin (NEURONTIN) 300 MG capsule Take 2 capsules by mouth 3 (three) times daily.  ? gentamicin cream (GARAMYCIN) 0.1 % Apply 1 application topically 2 (two) times daily.  ? Insulin Human (INSULIN PUMP) SOLN Inject 1 each into the skin continuous. Humalog - basal rate 3.2 Units/hr (Patient taking differently: Inject 1 each into the skin continuous. Novolog - basal rate 3.2 units/hr)  ? levothyroxine (SYNTHROID, LEVOTHROID) 125 MCG tablet Take 125 mcg by mouth daily.  ? levothyroxine (SYNTHROID, LEVOTHROID) 137 MCG tablet   ? losartan-hydrochlorothiazide (HYZAAR) 100-12.5 MG tablet Take 1 tablet by mouth daily.  ? NOVOLOG 100 UNIT/ML injection UTD INJ UP TO A MAX OF 122 UNITS [place] PER DAY VIA INSULIN PUMP  ? ondansetron (ZOFRAN) 4 MG tablet Take 1 tablet (4 mg total) by mouth every 6 (six) hours as needed for nausea.  ? OVER THE COUNTER MEDICATION Pt uses CPAP machine daily.  ? oxyCODONE-acetaminophen (PERCOCET) 5-325  MG tablet Take 1 tablet by mouth every 6 (six) hours as needed for severe pain.  ? PAXLOVID, 300/100, 20 x 150 MG & 10 x '100MG'$  TBPK Take by mouth.  ? Probiotic CAPS Take 1 capsule by mouth daily.  ? silver sulfADIAZINE (SILVADENE) 1 % cream Apply 1 application topically daily.  ? ?No current facility-administered medications for this visit. (Other)  ? ? ? ? ?REVIEW OF SYSTEMS: ?ROS   ?Positive for: Endocrine ?Negative for: Constitutional, Gastrointestinal, Neurological, Skin, Genitourinary, Musculoskeletal, HENT, Cardiovascular, Eyes, Respiratory, Psychiatric,  Allergic/Imm, Heme/Lymph ?Last edited by Silvestre Moment on 06/22/2021  1:49 PM.  ?  ? ? ? ?ALLERGIES ?Allergies  ?Allergen Reactions  ? Penicillins Hives  ?  Has patient had a PCN reaction causing immediate rash, facial/tongue/throat swelling, SOB or lightheadedness with hypotension: Unknown ?Has patient had a PCN reaction causing severe rash involving mucus membranes or skin necrosis: Unknown ?Has patient had a PCN reaction that required hospitalization: Unknown ?Has patient had a PCN reaction occurring within the last 10 years: No ?If all of the above answers are "NO", then may proceed with Cephalosporin use.  ? ? ?PAST MEDICAL HISTORY ?Past Medical History:  ?Diagnosis Date  ? Anxiety   ? Arthritis   ? knees  ? Coronary artery disease   ? Depression   ? Fatty liver   ? History of nuclear stress test 12/16/2011  ? exercise myoview; normal images with 2-21m ST-segment depression - subsequent cath revelaed subtotally occluded small 2nd marginal branch & 75% PDA lesion, normal LV function  ? Hypertension   ? Hypothyroidism   ? Neuropathy   ? in feet  ? OSA on CPAP   ? AHI = 44 (per patient) setting 13 per pt  ? Personal history of kidney stones   ? Primary localized osteoarthritis of left knee 04/24/2015  ? Primary localized osteoarthritis of right knee 07/02/2015  ? Tobacco abuse   ? Type 2 diabetes mellitus ([place]   ? insulin pump  ? ?Past Surgical History:  ?Procedure Laterality Date  ? 3rd right toe amputation  02/2016  ? AMPUTATION Right 12/22/2014  ? Procedure: RIGHT 2ND TOE AMPUTATION;  Surgeon: JWylene Simmer MD;  [facility] [facility]  Service: Orthopedics;  Laterality: Right;  ? AUGMENTATION MAMMAPLASTY Bilateral   ? BREAST ENHANCEMENT SURGERY Bilateral   ? CARDIAC CATHETERIZATION  01/04/2012  ? subtotally occluded small 2nd marginal branch & 75% PDA lesion, normal LV function  ? CATARACT EXTRACTION Bilateral   ? with lens implants  ? CHOLECYSTECTOMY  2008  ? COLONOSCOPY    ? COLONOSCOPY WITH PROPOFOL N/A 05/12/2016  ?  Procedure: COLONOSCOPY WITH PROPOFOL;  Surgeon: JJuanita Craver MD;  [facility];  Service: Endoscopy;  Laterality: N/A;  ? I & D KNEE WITH POLY EXCHANGE Left 05/13/2015  ? Procedure: IRRIGATION AND DEBRIDEMENT KNEE WITH POLY EXCHANGE;  Surgeon: JMarchia Bond MD;  [facility]  Service: [place]opedics;  Laterality: Left;  ? LEFT HEART CATHETERIZATION WITH CORONARY ANGIOGRAM N/A 01/04/2012  ? Procedure: LEFT HEART CATHETERIZATION WITH CORONARY ANGIOGRAM;  Surgeon: JLorretta Harp MD;  [facility];  Service: Cardiovascular;  Laterality: N/A;  ? PARTIAL KNEE ARTHROPLASTY Left 04/24/2015  ? Procedure: LEFT UNICOMPARTMENTAL KNEE ARTHROPLASTY ;  Surgeon: JMarchia Bond MD;  [facility] [facility]  Service: Orthopedics;  Laterality: Left;  ? PARTIAL KNEE ARTHROPLASTY Right 07/02/2015  ? Procedure: RIGHT UNI KNEE ARTHROPLASTY;  Surgeon: JMarchia Bond MD;  [facility]  Service: Orthopedics;  Laterality: Right;  ANESTHESIA:  GENERAL, PRE/POST OP FEMORAL NERVE  ? POSTERIOR CERVICAL FUSION/FORAMINOTOMY  1990  ? ROBOTIC ASSISTED TOTAL HYSTERECTOMY WITH BILATERAL SALPINGO OOPHERECTOMY Bilateral 02/17/2015  ? Procedure: ROBOTIC ASSISTED TOTAL HYSTERECTOMY WITH BILATERAL SALPINGO OOPHORECTOMY;  Surgeon: Everitt Amber, MD;  [facility];  Service: Gynecology;  Laterality: Bilateral;  ? ? ?FAMILY HISTORY ?Family History  ?Problem Relation Age of Onset  ? Stroke Mother   ? Hypertension Mother   ? Diabetes Mother   ? Alzheimer's disease Mother   ? Diabetes Father   ? COPD Father   ?     vent-dependent, MODS  ? Hypertension Sister   ? Mental illness Sister   ?     borderline personality d/o  ? Mental illness Sister   ?     schizoeffective d/o  ? Diabetes Sister   ? ? ?SOCIAL HISTORY ?Social History  ? ?Tobacco Use  ? Smoking status: Former  ?  Packs/day: 1.00  ?  Years: 25.00  ?  Pack years: 25.00  ?  Types: Cigarettes  ? Smokeless tobacco: Never  ? Tobacco comments:  ?  quit  April 2021  ?Vaping Use  ? Vaping Use: Never used  ?Substance Use Topics  ? Alcohol use: No  ?  Alcohol/week: 0.0 standard drinks  ? Drug use: No  ? ?  ? ?  ? ?OPHTHALMIC EXAM: ? ?Base Eye Exam   ? ? Visual Acuity (ETDRS)   ?

## 2021-06-22 NOTE — Assessment & Plan Note (Signed)
OS with clinical stigmata very severe nonproliferative diabetic retinopathy severe dot blot hemorrhages in the mid periphery and anterior. ? ?In order to prevent progression to PDR, will deliver anterior PRP in at least 1 or 2 quadrants in the coming months ? ?OS, will need peripheral anterior PRP however recent angiography March 2023 confirmed there is angiographic CME macula, thus we will protect the eye with intravitreal Avastin followed thereafter by peripheral PRP anteriorly fashion similar to what has been completed in the right eye ?

## 2021-06-22 NOTE — Assessment & Plan Note (Signed)
OD much more stable appearance to the peripheral retinopathy with good anterior peripheral PRP now applied temporally as well as inferiorly.  We will continue to monitor the remainder quadrants to look for signs of PDR progression ?

## 2021-06-24 ENCOUNTER — Encounter (INDEPENDENT_AMBULATORY_CARE_PROVIDER_SITE_OTHER): Payer: Self-pay | Admitting: Ophthalmology

## 2021-06-28 ENCOUNTER — Ambulatory Visit (INDEPENDENT_AMBULATORY_CARE_PROVIDER_SITE_OTHER): Payer: Medicare Other | Admitting: Ophthalmology

## 2021-06-28 ENCOUNTER — Encounter (INDEPENDENT_AMBULATORY_CARE_PROVIDER_SITE_OTHER): Payer: Medicare Other | Admitting: Ophthalmology

## 2021-06-28 ENCOUNTER — Encounter (INDEPENDENT_AMBULATORY_CARE_PROVIDER_SITE_OTHER): Payer: Self-pay | Admitting: Ophthalmology

## 2021-06-28 DIAGNOSIS — E113412 Type 2 diabetes mellitus with severe nonproliferative diabetic retinopathy with macular edema, left eye: Secondary | ICD-10-CM | POA: Diagnosis not present

## 2021-06-28 DIAGNOSIS — E113411 Type 2 diabetes mellitus with severe nonproliferative diabetic retinopathy with macular edema, right eye: Secondary | ICD-10-CM

## 2021-06-28 MED ORDER — BEVACIZUMAB 2.5 MG/0.1ML IZ SOSY
2.5000 mg | PREFILLED_SYRINGE | INTRAVITREAL | Status: AC | PRN
  Administered 2021-06-28: 2.5 mg via INTRAVITREAL

## 2021-06-28 NOTE — Assessment & Plan Note (Signed)
OD, doing well post most recent injection and Avastin as well as PRP #1 ?

## 2021-06-28 NOTE — Progress Notes (Signed)
? ? ?06/28/2021 ? ?  ? ?CHIEF COMPLAINT ?Patient presents for  ?Chief Complaint  ?Patient presents with  ? Diabetic Retinopathy with Macular Edema  ? ? ? ? ?HISTORY OF PRESENT ILLNESS: ?[name] is a 66 y.o. female who presents to the clinic today for:  ? ?HPI   ?1 week for DILATE, OS AVASTIN OCT. ?Pt stated no changes in vision. ?Pt denies floaters and FOL. ?Pt blood sugar was 103 this morning. ?Last edited by Silvestre Moment on 06/28/2021  3:08 PM.  ?  ? ? ?Referring physician: ?Burnard Bunting, MD ?[address] ?[place],  [place] [address] ? ?HISTORICAL INFORMATION:  ? ?Selected notes from the [facility] ?  ? ?Lab Results  ?Component Value Date  ? HGBA1C 8.0 (H) 11/13/2016  ?  ? ?CURRENT MEDICATIONS: ?No current outpatient medications on file. (Ophthalmic Drugs)  ? ?No current facility-administered medications for this visit. (Ophthalmic Drugs)  ? ?Current Outpatient Medications (Other)  ?Medication Sig  ? ACCU-CHEK AVIVA PLUS test strip   ? ALPRAZolam (XANAX) 0.5 MG tablet Take 0.5 mg by mouth at bedtime as needed for anxiety.   ? alum & mag hydroxide-simeth (MAALOX/MYLANTA) 200-200-20 MG/5ML suspension Take 15 mLs by mouth every 4 (four) hours as needed for indigestion or heartburn.  ? atorvastatin (LIPITOR) 20 MG tablet Take 20 mg by mouth daily.  ? atorvastatin (LIPITOR) 20 MG tablet Take 1 tablet by mouth daily.  ? buPROPion (WELLBUTRIN SR) 100 MG 12 hr tablet Take 100 mg by mouth every 12 (twelve) hours.  ? buPROPion (WELLBUTRIN XL) 150 MG 24 hr tablet TAKE 1 TABLET BY MOUTH EVERY DAY IN THE MORNING  ? cephALEXin (KEFLEX) 500 MG capsule Take 1 capsule (500 mg total) by mouth 4 (four) times daily.  ? Cholecalciferol (VITAMIN D PO) Take by mouth.  ? clindamycin (CLEOCIN) 300 MG capsule Take 1 capsule (300 mg total) by mouth 3 (three) times daily.  ? clotrimazole-betamethasone (LOTRISONE) cream Apply 1 application topically 2 (two) times daily.  ? Continuous Blood Gluc Receiver (FREESTYLE LIBRE 14 DAY  READER) DEVI Use to monitor blood glucose  ? Continuous Blood Gluc Sensor (FREESTYLE LIBRE 14 DAY SENSOR) MISC Use to monitor blood glucose continuously-Dx code E11.29  ? doxycycline (VIBRA-TABS) 100 MG tablet Take 1 tablet (100 mg total) by mouth 2 (two) times daily.  ? escitalopram (LEXAPRO) 20 MG tablet Take 20 mg by mouth daily.  ? gabapentin (NEURONTIN) 300 MG capsule Take 600 mg by mouth 3 (three) times daily.   ? gabapentin (NEURONTIN) 300 MG capsule Take 2 capsules by mouth 3 (three) times daily.  ? gentamicin cream (GARAMYCIN) 0.1 % Apply 1 application topically 2 (two) times daily.  ? Insulin Human (INSULIN PUMP) SOLN Inject 1 each into the skin continuous. Humalog - basal rate 3.2 Units/hr (Patient taking differently: Inject 1 each into the skin continuous. Novolog - basal rate 3.2 units/hr)  ? levothyroxine (SYNTHROID, LEVOTHROID) 125 MCG tablet Take 125 mcg by mouth daily.  ? levothyroxine (SYNTHROID, LEVOTHROID) 137 MCG tablet   ? losartan-hydrochlorothiazide (HYZAAR) 100-12.5 MG tablet Take 1 tablet by mouth daily.  ? NOVOLOG 100 UNIT/ML injection UTD INJ UP TO A MAX OF 122 UNITS [place] PER DAY VIA INSULIN PUMP  ? ondansetron (ZOFRAN) 4 MG tablet Take 1 tablet (4 mg total) by mouth every 6 (six) hours as needed for nausea.  ? OVER THE COUNTER MEDICATION Pt uses CPAP machine daily.  ? oxyCODONE-acetaminophen (PERCOCET) 5-325 MG tablet Take 1 tablet  by mouth every 6 (six) hours as needed for severe pain.  ? PAXLOVID, 300/100, 20 x 150 MG & 10 x '100MG'$  TBPK Take by mouth.  ? Probiotic CAPS Take 1 capsule by mouth daily.  ? silver sulfADIAZINE (SILVADENE) 1 % cream Apply 1 application topically daily.  ? ?No current facility-administered medications for this visit. (Other)  ? ? ? ? ?REVIEW OF SYSTEMS: ? ? ? ?ALLERGIES ?Allergies  ?Allergen Reactions  ? Penicillins Hives  ?  Has patient had a PCN reaction causing immediate rash, facial/tongue/throat swelling, SOB or lightheadedness with hypotension:  Unknown ?Has patient had a PCN reaction causing severe rash involving mucus membranes or skin necrosis: Unknown ?Has patient had a PCN reaction that required hospitalization: Unknown ?Has patient had a PCN reaction occurring within the last 10 years: No ?If all of the above answers are "NO", then may proceed with Cephalosporin use.  ? ? ?PAST MEDICAL HISTORY ?Past Medical History:  ?Diagnosis Date  ? Anxiety   ? Arthritis   ? knees  ? Coronary artery disease   ? Depression   ? Fatty liver   ? History of nuclear stress test 12/16/2011  ? exercise myoview; normal images with 2-71m ST-segment depression - subsequent cath revelaed subtotally occluded small 2nd marginal branch & 75% PDA lesion, normal LV function  ? Hypertension   ? Hypothyroidism   ? Neuropathy   ? in feet  ? OSA on CPAP   ? AHI = 44 (per patient) setting 13 per pt  ? Personal history of kidney stones   ? Primary localized osteoarthritis of left knee 04/24/2015  ? Primary localized osteoarthritis of right knee 07/02/2015  ? Tobacco abuse   ? Type 2 diabetes mellitus ([place]   ? insulin pump  ? ?Past Surgical History:  ?Procedure Laterality Date  ? 3rd right toe amputation  02/2016  ? AMPUTATION Right 12/22/2014  ? Procedure: RIGHT 2ND TOE AMPUTATION;  Surgeon: JWylene Simmer MD;  [place] [facility]  Service: Orthopedics;  Laterality: Right;  ? AUGMENTATION MAMMAPLASTY Bilateral   ? BREAST ENHANCEMENT SURGERY Bilateral   ? CARDIAC CATHETERIZATION  01/04/2012  ? subtotally occluded small 2nd marginal branch & 75% PDA lesion, normal LV function  ? CATARACT EXTRACTION Bilateral   ? with lens implants  ? CHOLECYSTECTOMY  2008  ? COLONOSCOPY    ? COLONOSCOPY WITH PROPOFOL N/A 05/12/2016  ? Procedure: COLONOSCOPY WITH PROPOFOL;  Surgeon: JJuanita Craver MD;  [facility];  Service: Endoscopy;  Laterality: N/A;  ? I & D KNEE WITH POLY EXCHANGE Left 05/13/2015  ? Procedure: IRRIGATION AND DEBRIDEMENT KNEE WITH POLY EXCHANGE;  Surgeon: JMarchia Bond MD;  [place]  [facility]  Service: Orthopedics;  Laterality: Left;  ? LEFT HEART CATHETERIZATION WITH CORONARY ANGIOGRAM N/A 01/04/2012  ? Procedure: LEFT HEART CATHETERIZATION WITH CORONARY ANGIOGRAM;  Surgeon: JLorretta Harp MD;  [facility];  Service: Cardiovascular;  Laterality: N/A;  ? PARTIAL KNEE ARTHROPLASTY Left 04/24/2015  ? Procedure: LEFT UNICOMPARTMENTAL KNEE ARTHROPLASTY ;  Surgeon: JMarchia Bond MD;  [facility]  Service: Orthopedics;  Laterality: Left;  ? PARTIAL KNEE ARTHROPLASTY Right 07/02/2015  ? Procedure: RIGHT UNI KNEE ARTHROPLASTY;  Surgeon: JMarchia Bond MD;  [facility]  Service: Orthopedics;  Laterality: Right;  ANESTHESIA:  GENERAL, PRE/POST OP FEMORAL NERVE  ? POSTERIOR CERVICAL FUSION/FORAMINOTOMY  1990  ? ROBOTIC ASSISTED TOTAL HYSTERECTOMY WITH BILATERAL SALPINGO OOPHERECTOMY Bilateral 02/17/2015  ? Procedure: ROBOTIC ASSISTED TOTAL HYSTERECTOMY  WITH BILATERAL SALPINGO OOPHORECTOMY;  Surgeon: Everitt Amber, MD;  [facility];  Service: Gynecology;  Laterality: Bilateral;  ? ? ?FAMILY HISTORY ?Family History  ?Problem Relation Age of Onset  ? Stroke Mother   ? Hypertension Mother   ? Diabetes Mother   ? Alzheimer's disease Mother   ? Diabetes Father   ? COPD Father   ?     vent-dependent, MODS  ? Hypertension Sister   ? Mental illness Sister   ?     borderline personality d/o  ? Mental illness Sister   ?     schizoeffective d/o  ? Diabetes Sister   ? ? ?SOCIAL HISTORY ?Social History  ? ?Tobacco Use  ? Smoking status: Former  ?  Packs/day: 1.00  ?  Years: 25.00  ?  Pack years: 25.00  ?  Types: Cigarettes  ? Smokeless tobacco: Never  ? Tobacco comments:  ?  quit April 2021  ?Vaping Use  ? Vaping Use: Never used  ?Substance Use Topics  ? Alcohol use: No  ?  Alcohol/week: 0.0 standard drinks  ? Drug use: No  ? ?  ? ?  ? ?OPHTHALMIC EXAM: ? ?Base Eye Exam   ? ? Visual Acuity (ETDRS)   ? ?   Right Left  ? Dist [place] 20/20 20/20 -2  ? ?  ?  ? ?  Tonometry (Tonopen, 3:13 PM)   ? ?   Right Left  ? Pressure 13 13  ? ?  ?  ? ? Pupils   ? ?   Pupils APD  ? Right PERRL None  ? Left PERRL None  ? ?  ?  ? ? Visual Fields   ? ?   Left Right  ?  Full Full  ? ?  ?  ? ? Ext

## 2021-06-28 NOTE — Assessment & Plan Note (Signed)
OS for Avastin No. 1 followed thereafter soon thereafter by PRP to prevent progression of severe NPDR ?

## 2021-06-29 ENCOUNTER — Ambulatory Visit (INDEPENDENT_AMBULATORY_CARE_PROVIDER_SITE_OTHER): Payer: Medicare Other

## 2021-06-29 DIAGNOSIS — E0842 Diabetes mellitus due to underlying condition with diabetic polyneuropathy: Secondary | ICD-10-CM | POA: Diagnosis not present

## 2021-06-29 DIAGNOSIS — Z89421 Acquired absence of other right toe(s): Secondary | ICD-10-CM

## 2021-06-29 NOTE — Progress Notes (Signed)
SITUATION ?Reason for Visit: Fitting of Diabetic [place] ?Patient / Caregiver Report:  Patient is satisfied with fit and function of shoes and insoles. ? ?OBJECTIVE DATA: ?Patient History / Diagnosis:   ?  ICD-10-CM   ?1. Diabetes mellitus due to underlying condition with diabetic polyneuropathy, unspecified whether long term insulin use (HCC)  E08.42   ?  ?2. History of amputation of lesser toe, right ([place])  Z89.421   ?  ? ? ?Change in Status:   None ? ?ACTIONS PERFORMED: ?In-Person Delivery, patient was fit with: ?- 1x pair [id number] PDAC approved prefabricated Diabetic Shoes: [place] 664 4.5XW ?- 3x pair [id number] PDAC approved vacuum formed custom diabetic insoles; [place]: [id number] ? ?Shoes and insoles were verified for structural integrity and safety. Patient wore shoes and insoles in office. Skin was inspected and free of areas of concern after wearing shoes and inserts. Shoes and inserts fit properly. Patient / Caregiver provided with ferbal instruction and demonstration regarding donning, doffing, wear, care, proper fit, function, purpose, cleaning, and use of shoes and insoles ' and in all related precautions and risks and benefits regarding shoes and insoles. Patient / Caregiver was instructed to wear properly fitting socks with shoes at all times. Patient was also provided with verbal instruction regarding how to report any failures or malfunctions of shoes or inserts, and necessary follow up care. Patient / Caregiver was also instructed to contact physician regarding change in status that may affect function of shoes and inserts.  ? ?Patient / Caregiver verbalized undersatnding of instruction provided. Patient / Caregiver demonstrated independence with proper donning and doffing of shoes and inserts. ? ?PLAN ?Patient to follow with treating physician as recommended. Plan of care was discussed with and agreed upon by patient and/or caregiver. All questions were answered and concerns  addressed. ? ?

## 2021-06-30 ENCOUNTER — Ambulatory Visit (INDEPENDENT_AMBULATORY_CARE_PROVIDER_SITE_OTHER): Payer: Medicare Other | Admitting: Podiatry

## 2021-06-30 DIAGNOSIS — L97512 Non-pressure 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: Secondary | ICD-10-CM | POA: Diagnosis not present

## 2021-06-30 DIAGNOSIS — E0842 Diabetes mellitus due to underlying condition with diabetic polyneuropathy: Secondary | ICD-10-CM

## 2021-06-30 MED ORDER — GENTAMICIN SULFATE 0.1 % EX OINT
TOPICAL_OINTMENT | Freq: Three times a day (TID) | CUTANEOUS | 1 refills | Status: DC
Start: 2021-06-30 — End: 2022-08-15

## 2021-06-30 NOTE — Progress Notes (Signed)
? ?Subjective:  ?66 y.o. female with PMHx of diabetes mellitus presenting today for follow-up evaluation of a symptomatic callus to the plantar aspect of the first MTP joint right foot.  Patient states that she has noticed some increased pain and tenderness to the area.  She presents for further treatment evaluation.  She does have history of recurrent ulcers to the great toe joint ? ? ?Past Medical History:  ?Diagnosis Date  ? Anxiety   ? Arthritis   ? knees  ? Coronary artery disease   ? Depression   ? Fatty liver   ? History of nuclear stress test 12/16/2011  ? exercise myoview; normal images with 2-50m ST-segment depression - subsequent cath revelaed subtotally occluded small 2nd marginal branch & 75% PDA lesion, normal LV function  ? Hypertension   ? Hypothyroidism   ? Neuropathy   ? in feet  ? OSA on CPAP   ? AHI = 44 (per patient) setting 13 per pt  ? Personal history of kidney stones   ? Primary localized osteoarthritis of left knee 04/24/2015  ? Primary localized osteoarthritis of right knee 07/02/2015  ? Tobacco abuse   ? Type 2 diabetes mellitus ([place]   ? insulin pump  ? ?Past Surgical History:  ?Procedure Laterality Date  ? 3rd right toe amputation  02/2016  ? AMPUTATION Right 12/22/2014  ? Procedure: RIGHT 2ND TOE AMPUTATION;  Surgeon: JWylene Simmer MD;  [facility] [facility]  Service: Orthopedics;  Laterality: Right;  ? AUGMENTATION MAMMAPLASTY Bilateral   ? BREAST ENHANCEMENT SURGERY Bilateral   ? CARDIAC CATHETERIZATION  01/04/2012  ? subtotally occluded small 2nd marginal branch & 75% PDA lesion, normal LV function  ? CATARACT EXTRACTION Bilateral   ? with lens implants  ? CHOLECYSTECTOMY  2008  ? COLONOSCOPY    ? COLONOSCOPY WITH PROPOFOL N/A 05/12/2016  ? Procedure: COLONOSCOPY WITH PROPOFOL;  Surgeon: JJuanita Craver MD;  [facility];  Service: Endoscopy;  Laterality: N/A;  ? I & D KNEE WITH POLY EXCHANGE Left 05/13/2015  ? Procedure: IRRIGATION AND DEBRIDEMENT KNEE WITH POLY EXCHANGE;  Surgeon:  JMarchia Bond MD;  [facility] [facility]  Service: Orthopedics;  Laterality: Left;  ? LEFT HEART CATHETERIZATION WITH CORONARY ANGIOGRAM N/A 01/04/2012  ? Procedure: LEFT HEART CATHETERIZATION WITH CORONARY ANGIOGRAM;  Surgeon: JLorretta Harp MD;  [facility];  Service: Cardiovascular;  Laterality: N/A;  ? PARTIAL KNEE ARTHROPLASTY Left 04/24/2015  ? Procedure: LEFT UNICOMPARTMENTAL KNEE ARTHROPLASTY ;  Surgeon: JMarchia Bond MD;  [facility]  Service: Orthopedics;  Laterality: Left;  ? PARTIAL KNEE ARTHROPLASTY Right 07/02/2015  ? Procedure: RIGHT UNI KNEE ARTHROPLASTY;  Surgeon: JMarchia Bond MD;  [facility] [facility]  Service: Orthopedics;  Laterality: Right;  ANESTHESIA:  GENERAL, PRE/POST OP FEMORAL NERVE  ? POSTERIOR CERVICAL FUSION/FORAMINOTOMY  1990  ? ROBOTIC ASSISTED TOTAL HYSTERECTOMY WITH BILATERAL SALPINGO OOPHERECTOMY Bilateral 02/17/2015  ? Procedure: ROBOTIC ASSISTED TOTAL HYSTERECTOMY WITH BILATERAL SALPINGO OOPHORECTOMY;  Surgeon: EEveritt Amber MD;  [facility];  Service: Gynecology;  Laterality: Bilateral;  ? ?Allergies  ?Allergen Reactions  ? Penicillins Hives  ?  Has patient had a PCN reaction causing immediate rash, facial/tongue/throat swelling, SOB or lightheadedness with hypotension: Unknown ?Has patient had a PCN reaction causing severe rash involving mucus membranes or skin necrosis: Unknown ?Has patient had a PCN reaction that required hospitalization: Unknown ?Has patient had a PCN reaction occurring within the last 10 years: No ?If all of the  above answers are "NO", then may proceed with Cephalosporin use.  ? ? ? ?  ?Objective/Physical Exam ?General: The patient is alert and oriented x3 in no acute distress. ? ?Dermatology:  ?Wound #1 noted to the plantar aspect of the right great toe joint measuring approximately 2.0 x 2.5 x 0.2 cm cm (LxWxD).  The ulcer was visible after debridement of the overlying callus tissue.  There was some  serosanguineous drainage noted.  No malodor.  There is no exposed bone muscle tendon ligament or joint.  Ulcers extend into the subcutaneous tissue.  Clinically there does not appear to be any evidence of infection.  There is no erythema or edema around the toe. ?Skin is warm, dry and supple bilateral lower extremities. ? ?Vascular: Palpable pedal pulses bilaterally. No edema or erythema noted. Capillary refill within normal limits. ? ?Neurological: Epicritic and protective threshold diminished bilaterally.  ? ?Musculoskeletal Exam: History of prior toe amputations ? ?Assessment: ?1.  Ulcer plantar aspect of the first MTP right secondary to diabetes mellitus ?2. diabetes mellitus w/ peripheral neuropathy ? ? ?Plan of Care:  ?1. Patient was evaluated. ?2. medically necessary excisional debridement including subcutaneous tissue was performed using a tissue nipper and a chisel blade. Excisional debridement of all the necrotic nonviable tissue down to healthy bleeding viable tissue was performed with post-debridement measurements same as pre-. ?3. the wound was cleansed and dry sterile dressing applied. ?4.  Prescription for gentamicin cream.  Apply daily with a Band-Aid  ?5.  Continue wearing diabetic shoes and insoles  ?6.  Patient is to return to clinic in 2 weeks. ? ? ?Edrick Kins, DPM ?[place] ? ?Dr. Edrick Kins, DPM  ?  ?[place]                                        ?[place], [place] [address]                ?Office [phone]  ?Fax [phone] ? ? ? ? ? ?

## 2021-07-01 ENCOUNTER — Ambulatory Visit (INDEPENDENT_AMBULATORY_CARE_PROVIDER_SITE_OTHER): Payer: Medicare Other | Admitting: Licensed Clinical Social Worker

## 2021-07-01 DIAGNOSIS — F331 Major depressive disorder, recurrent, moderate: Secondary | ICD-10-CM

## 2021-07-01 NOTE — Progress Notes (Addendum)
Virtual Visit via Video Note ? ?I connected with [name] on 07/01/21 at  2:00 PM EDT by a video enabled telemedicine application and verified that I am speaking with the correct person using two identifiers. ? ?Location: ?Patient: home ?Provider: [facility] ?  ?I discussed the limitations of evaluation and management by telemedicine and the availability of in person appointments. The patient expressed understanding and agreed to proceed. ?  ?I discussed the assessment and treatment plan with the patient. The patient was provided an opportunity to ask questions and all were answered. The patient agreed with the plan and demonstrated an understanding of the instructions. ?  ?The patient was advised to call back or seek an in-person evaluation if the symptoms worsen or if the condition fails to improve as anticipated. ? ?I provided 60 minutes of non-face-to-face time during this encounter. ? ? ?Cavion Faiola R Shelley Pooley, LCSW ?Comprehensive Clinical Assessment (CCA) Note ? ?07/02/2021 ?[name] ?[id number] ? ?Video connection was lost when less than 50% of the duration of the visit was complete, at which time the remainder of the visit was completed via audio only. ? ? ?Chief Complaint:  ?Chief Complaint  ?Patient presents with  ? Establish Care  ? Depression  ? ?Visit Diagnosis:  ?MDD, recurrent, moderate  ? ? ?CCA Screening, Triage and Referral (STR) ? ?Patient Reported Information ?How did you hear about [place]? Self ? ?Referral name: [name] is a 66 yo female reporting virtually to [facility] for re-establishment of outpatient psychiatric services. 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 Pt reports that she is currently feeling more depressed and would like to continue counseling services. Pt denies current SI, HI, or AVH. Pt denies any substance use. Pt reports that she is having some diabetes-related medical concerns, including chronic pain.  Pt is also having stress associated with family conflict including her niece, [name] Pt is very close with her sister, [name] and helps [name] manage symptoms of schizoaffective disorder. Pt having some on and off conflict with husband about his overall engagement with marriage. Pt reports that she would like to work on increasing motivation, initiative, and focus more on self care and life balance. ? ?Referral phone number: No data recorded ? ?Whom do you see for routine medical problems? Primary Care ? ?Practice/Facility Name: [facility] ? ?Practice/Facility Phone Number: No data recorded ?Name of Contact: No data recorded ?Contact Number: No data recorded ?Contact Fax Number: No data recorded ?Prescriber Name: Dr. Burnard Bunting and Dr. Tivis Ringer ? ?Prescriber Address (if known): No data recorded ? ?What Is the Reason for Your Visit/Call Today? No data recorded ?How Long Has This Been Causing You Problems? > than 6 months ? ?What Do You Feel Would Help You the Most Today? Treatment for Depression or other mood problem ? ? ?Have You Recently Been in Any Inpatient Treatment (Hospital/Detox/Crisis Center/28-Day Program)? No ? ?Name/Location of Program/Hospital:No data recorded ?How Long Were You There? No data recorded ?When Were You Discharged? No data recorded ? ?Have You Ever Received Services From [employer] Before? Yes ? ?Who Do You See at [facility]? No data recorded ? ?Have You Recently Had Any Thoughts About Hurting Yourself? No ? ?Are You Planning to Commit Suicide/Harm Yourself At This time? No ? ? ?Have you Recently Had Thoughts About [place]? No ? ?Explanation: No data recorded ? ?Have You Used Any Alcohol or Drugs in the Past 24  Hours? No ? ?How Long Ago Did You Use Drugs or Alcohol? No data recorded ?What Did You Use and How Much? No data recorded ? ?Do You Currently Have a Therapist/Psychiatrist? No ? ?Name of Therapist/Psychiatrist: No data recorded ? ?Have You  Been Recently Discharged From Any Office Practice or Programs? No ? ?Explanation of Discharge From Practice/Program: No data recorded ? ?  ?CCA Screening Triage Referral Assessment ?Type of Contact: Tele-Assessment ? ?Is this Initial or Reassessment? Initial Assessment ? ?Date Telepsych consult ordered in [facility]:  No data recorded ?Time Telepsych consult ordered in [facility]:  No data recorded ? ?Patient Reported Information Reviewed? No data recorded ?Patient Left Without Being Seen? No data recorded ?Reason for Not Completing Assessment: No data recorded ? ?Collateral Involvement: none ? ? ?Does Patient Have a Stage manager Guardian? No data recorded ?Name and Contact of Legal Guardian: No data recorded ?If Minor and Not Living with Parent(s), Who has Custody? No data recorded ?Is CPS involved or ever been involved? Never ? ?Is APS involved or ever been involved? Never ? ? ?Patient Determined To Be At Risk for Harm To Self or Others Based on Review of Patient Reported Information or Presenting Complaint? No ? ?Method: No data recorded ?Availability of Means: No data recorded ?Intent: No data recorded ?Notification Required: No data recorded ?Additional Information for Danger to Others Potential: No data recorded ?Additional Comments for Danger to Others Potential: No data recorded ?Are There Guns or Other Weapons in [place]? No data recorded ?Types of Guns/Weapons: No data recorded ?Are These Weapons Safely Secured?                            No data recorded ?Who Could Verify You Are Able To Have These Secured: No data recorded ?Do You Have any Outstanding Charges, Pending Court Dates, Parole/Probation? No data recorded ?Contacted To Inform of Risk of Harm To Self or Others: No data recorded ? ?Location of Assessment: Other (comment) ([facility] virtual) ? ? ?Does Patient Present under Involuntary Commitment? No ? ?IVC Papers Initial File Date: No data recorded ? ?[place] of Residence: [name] ? ? ?Patient Currently  Receiving the Following Services: Medication Management ? ? ?Determination of Need: Routine (7 days) ? ? ?Options For Referral: Medication Management; Outpatient Therapy ? ? ? ? ?CCA Biopsychosocial ?Intake/Chief Complaint:  depression ? ?Current Symptoms/Problems: low motivation, low energy, sadness, hypersomnia, fatigue, weight gain ? ? ?Patient Reported Schizophrenia/Schizoaffective Diagnosis in Past: No ? ? ?Strengths: good self awareness ? ?Preferences: outpatient psychotherapy services ? ?Abilities: enjoys cooking; gardening; taking care of pets ? ? ?Type of Services Patient Feels are Needed: outpatient psychotherapy ? ? ?Initial Clinical Notes/Concerns: No data recorded ? ?Mental Health Symptoms ?Depression:   ?Fatigue; Hopelessness; Sleep (too much or little); Tearfulness; Weight gain/loss ?  ?Duration of Depressive symptoms:  ?Greater than two weeks ?  ?Mania:   ?None ?  ?Anxiety:    ?Worrying; Fatigue ?  ?Psychosis:   ?None ?  ?Duration of Psychotic symptoms: No data recorded  ?Trauma:   ?None ?  ?Obsessions:  None ?  ?Compulsions:  None ?  ?Inattention:  None ?  ?Hyperactivity/Impulsivity:  None ?  ?Oppositional/Defiant Behaviors:  None ?  ?Emotional Irregularity:  None ?  ?Other Mood/Personality Symptoms:  No data recorded  ? ?Mental Status Exam ?Appearance and self-care  ?Stature:  Average ?  ?Weight:  Overweight ?  ?Clothing:  Neat/clean ?  ?Grooming:  Normal ?  ?Cosmetic use:  None ?  ?Posture/gait:  Normal ?  ?Motor activity:  Not Remarkable ?  ?Sensorium  ?Attention:  Normal ?  ?Concentration:  Normal ?  ?Orientation:  X5 ?  ?Recall/memory:  Normal ?  ?Affect and Mood  ?Affect:  Depressed ?  ?Mood:  Depressed ?  ?Relating  ?Eye contact:  Normal ?  ?Facial expression:  Depressed ?  ?Attitude toward examiner:  Cooperative ?  ?Thought and Language  ?Speech flow: Clear and Coherent ?  ?Thought content:  Appropriate to Mood and Circumstances ?  ?Preoccupation:  None ?  ?Hallucinations:  None ?   ?Organization:  No data recorded  ?Executive Functions  ?Fund of Knowledge:  Good ?  ?Intelligence:  Above Average ?  ?Abstraction:  Normal ?  ?Judgement:  Good ?  ?Reality Testing:  Realistic ?  ?Insight:  [place]

## 2021-07-02 NOTE — Plan of Care (Signed)
Developed tx plan based on pt self reported input  

## 2021-07-07 ENCOUNTER — Encounter (INDEPENDENT_AMBULATORY_CARE_PROVIDER_SITE_OTHER): Payer: Self-pay | Admitting: Ophthalmology

## 2021-07-07 ENCOUNTER — Ambulatory Visit (INDEPENDENT_AMBULATORY_CARE_PROVIDER_SITE_OTHER): Payer: Medicare Other | Admitting: Ophthalmology

## 2021-07-07 DIAGNOSIS — E113411 Type 2 diabetes mellitus with severe nonproliferative diabetic retinopathy with macular edema, right eye: Secondary | ICD-10-CM

## 2021-07-07 DIAGNOSIS — E113412 Type 2 diabetes mellitus with severe nonproliferative diabetic retinopathy with macular edema, left eye: Secondary | ICD-10-CM | POA: Diagnosis not present

## 2021-07-07 NOTE — Assessment & Plan Note (Signed)
OD looks great, stable acuity ?

## 2021-07-07 NOTE — Progress Notes (Signed)
? ? ?07/07/2021 ? ?  ? ?CHIEF COMPLAINT ?Patient presents for  ?Chief Complaint  ?Patient presents with  ? Diabetic Retinopathy with Macular Edema  ? ? ? ? ?HISTORY OF PRESENT ILLNESS: ?[name] is a 66 y.o. female who presents to the clinic today for:  ? ?HPI   ?1 week PRP OS. ?Pt stated, "in my right eye, I see a little light in my right. Its like a cluster of light. It happened one day last week." ?Pt denies floaters. ?Pt reports vision has been "sharp". ? ?Last edited by Silvestre Moment on 07/07/2021  1:22 PM.  ?  ? ? ?Referring physician: ?Burnard Bunting, MD ?[address] ?[place],  [place] [address] ? ?HISTORICAL INFORMATION:  ? ?Selected notes from the [facility] ?  ? ?Lab Results  ?Component Value Date  ? HGBA1C 8.0 (H) 11/13/2016  ?  ? ?CURRENT MEDICATIONS: ?No current outpatient medications on file. (Ophthalmic Drugs)  ? ?No current facility-administered medications for this visit. (Ophthalmic Drugs)  ? ?Current Outpatient Medications (Other)  ?Medication Sig  ? ACCU-CHEK AVIVA PLUS test strip   ? ALPRAZolam (XANAX) 0.5 MG tablet Take 0.5 mg by mouth at bedtime as needed for anxiety.   ? alum & mag hydroxide-simeth (MAALOX/MYLANTA) 200-200-20 MG/5ML suspension Take 15 mLs by mouth every 4 (four) hours as needed for indigestion or heartburn.  ? atorvastatin (LIPITOR) 20 MG tablet Take 20 mg by mouth daily.  ? atorvastatin (LIPITOR) 20 MG tablet Take 1 tablet by mouth daily.  ? buPROPion (WELLBUTRIN SR) 100 MG 12 hr tablet Take 100 mg by mouth every 12 (twelve) hours.  ? buPROPion (WELLBUTRIN XL) 150 MG 24 hr tablet TAKE 1 TABLET BY MOUTH EVERY DAY IN THE MORNING  ? cephALEXin (KEFLEX) 500 MG capsule Take 1 capsule (500 mg total) by mouth 4 (four) times daily.  ? Cholecalciferol (VITAMIN D PO) Take by mouth.  ? clindamycin (CLEOCIN) 300 MG capsule Take 1 capsule (300 mg total) by mouth 3 (three) times daily.  ? clotrimazole-betamethasone (LOTRISONE) cream Apply 1 application topically 2 (two) times  daily.  ? Continuous Blood Gluc Receiver (FREESTYLE LIBRE 14 DAY READER) DEVI Use to monitor blood glucose  ? Continuous Blood Gluc Sensor (FREESTYLE LIBRE 14 DAY SENSOR) MISC Use to monitor blood glucose continuously-Dx code E11.29  ? doxycycline (VIBRA-TABS) 100 MG tablet Take 1 tablet (100 mg total) by mouth 2 (two) times daily.  ? DULoxetine (CYMBALTA) 60 MG capsule Take 60 mg by mouth daily.  ? escitalopram (LEXAPRO) 20 MG tablet Take 20 mg by mouth daily.  ? gabapentin (NEURONTIN) 300 MG capsule Take 600 mg by mouth 3 (three) times daily.   ? gabapentin (NEURONTIN) 300 MG capsule Take 2 capsules by mouth 3 (three) times daily.  ? gentamicin ointment (GARAMYCIN) 0.1 % Apply topically 3 (three) times daily.  ? Insulin Human (INSULIN PUMP) SOLN Inject 1 each into the skin continuous. Humalog - basal rate 3.2 Units/hr (Patient taking differently: Inject 1 each into the skin continuous. Novolog - basal rate 3.2 units/hr)  ? levothyroxine (SYNTHROID, LEVOTHROID) 125 MCG tablet Take 125 mcg by mouth daily.  ? levothyroxine (SYNTHROID, LEVOTHROID) 137 MCG tablet   ? losartan-hydrochlorothiazide (HYZAAR) 100-12.5 MG tablet Take 1 tablet by mouth daily.  ? NOVOLOG 100 UNIT/ML injection UTD INJ UP TO A MAX OF 122 UNITS [place] PER DAY VIA INSULIN PUMP  ? ondansetron (ZOFRAN) 4 MG tablet Take 1 tablet (4 mg total) by mouth every 6 (six)  hours as needed for nausea.  ? OVER THE COUNTER MEDICATION Pt uses CPAP machine daily.  ? oxyCODONE-acetaminophen (PERCOCET) 5-325 MG tablet Take 1 tablet by mouth every 6 (six) hours as needed for severe pain.  ? PAXLOVID, 300/100, 20 x 150 MG & 10 x '100MG'$  TBPK Take by mouth.  ? Probiotic CAPS Take 1 capsule by mouth daily.  ? silver sulfADIAZINE (SILVADENE) 1 % cream Apply 1 application topically daily.  ? ?No current facility-administered medications for this visit. (Other)  ? ? ? ? ?REVIEW OF SYSTEMS: ?ROS   ?Negative for: Constitutional, Gastrointestinal, Neurological, Skin, Genitourinary,  Musculoskeletal, HENT, Endocrine, Cardiovascular, Eyes, Respiratory, Psychiatric, Allergic/Imm, Heme/Lymph ?Last edited by Silvestre Moment on 07/07/2021  1:22 PM.  ?  ? ? ? ?ALLERGIES ?Allergies  ?Allergen Reactions  ? Penicillins Hives  ?  Has patient had a PCN reaction causing immediate rash, facial/tongue/throat swelling, SOB or lightheadedness with hypotension: Unknown ?Has patient had a PCN reaction causing severe rash involving mucus membranes or skin necrosis: Unknown ?Has patient had a PCN reaction that required hospitalization: Unknown ?Has patient had a PCN reaction occurring within the last 10 years: No ?If all of the above answers are "NO", then may proceed with Cephalosporin use.  ? ? ?PAST MEDICAL HISTORY ?Past Medical History:  ?Diagnosis Date  ? Anxiety   ? Arthritis   ? knees  ? Coronary artery disease   ? Depression   ? Fatty liver   ? History of nuclear stress test 12/16/2011  ? exercise myoview; normal images with 2-4m ST-segment depression - subsequent cath revelaed subtotally occluded small 2nd marginal branch & 75% PDA lesion, normal LV function  ? Hypertension   ? Hypothyroidism   ? Neuropathy   ? in feet  ? OSA on CPAP   ? AHI = 44 (per patient) setting 13 per pt  ? Personal history of kidney stones   ? Primary localized osteoarthritis of left knee 04/24/2015  ? Primary localized osteoarthritis of right knee 07/02/2015  ? Tobacco abuse   ? Type 2 diabetes mellitus ([place]   ? insulin pump  ? ?Past Surgical History:  ?Procedure Laterality Date  ? 3rd right toe amputation  02/2016  ? AMPUTATION Right 12/22/2014  ? Procedure: RIGHT 2ND TOE AMPUTATION;  Surgeon: JWylene Simmer MD;  [facility]  Service: Orthopedics;  Laterality: Right;  ? AUGMENTATION MAMMAPLASTY Bilateral   ? BREAST ENHANCEMENT SURGERY Bilateral   ? CARDIAC CATHETERIZATION  01/04/2012  ? subtotally occluded small 2nd marginal branch & 75% PDA lesion, normal LV function  ? CATARACT EXTRACTION Bilateral   ? with lens implants  ?  CHOLECYSTECTOMY  2008  ? COLONOSCOPY    ? COLONOSCOPY WITH PROPOFOL N/A 05/12/2016  ? Procedure: COLONOSCOPY WITH PROPOFOL;  Surgeon: JJuanita Craver MD;  [facility];  Service: Endoscopy;  Laterality: N/A;  ? I & D KNEE WITH POLY EXCHANGE Left 05/13/2015  ? Procedure: IRRIGATION AND DEBRIDEMENT KNEE WITH POLY EXCHANGE;  Surgeon: JMarchia Bond MD;  [facility]  Service: Orthopedics;  Laterality: Left;  ? LEFT HEART CATHETERIZATION WITH CORONARY ANGIOGRAM N/A 01/04/2012  ? Procedure: LEFT HEART CATHETERIZATION WITH CORONARY ANGIOGRAM;  Surgeon: JLorretta Harp MD;  [facility];  Service: Cardiovascular;  Laterality: N/A;  ? PARTIAL KNEE ARTHROPLASTY Left 04/24/2015  ? Procedure: LEFT UNICOMPARTMENTAL KNEE ARTHROPLASTY ;  Surgeon: JMarchia Bond MD;  [facility]  Service: Orthopedics;  Laterality: Left;  ? PARTIAL KNEE ARTHROPLASTY Right 07/02/2015  ?  Procedure: RIGHT UNI KNEE ARTHROPLASTY;  Surgeon: Marchia Bond, MD;  Location: [facility];  Service: Orthopedics;  Laterality: Right;  ANESTHESIA:  GENERAL, PRE/POST OP FEMORAL NERVE  ? POSTERIOR CERVICAL FUSION/FORAMINOTOMY  1990  ? ROBOTIC ASSISTED TOTAL HYSTERECTOMY WITH BILATERAL SALPINGO OOPHERECTOMY Bilateral 02/17/2015  ? Procedure: ROBOTIC ASSISTED TOTAL HYSTERECTOMY WITH BILATERAL SALPINGO OOPHORECTOMY;  Surgeon: Everitt Amber, MD;  [facility];  Service: Gynecology;  Laterality: Bilateral;  ? ? ?FAMILY HISTORY ?Family History  ?Problem Relation Age of Onset  ? Stroke Mother   ? Hypertension Mother   ? Diabetes Mother   ? Alzheimer's disease Mother   ? Diabetes Father   ? COPD Father   ?     vent-dependent, MODS  ? Hypertension Sister   ? Mental illness Sister   ?     borderline personality d/o  ? Mental illness Sister   ?     schizoeffective d/o  ? Diabetes Sister   ? ? ?SOCIAL HISTORY ?Social History  ? ?Tobacco Use  ? Smoking status: Former  ?  Packs/day: 1.00  ?  Years: 25.00  ?  Pack years:  25.00  ?  Types: Cigarettes  ? Smokeless tobacco: Never  ? Tobacco comments:  ?  quit April 2021  ?Vaping Use  ? Vaping Use: Never used  ?Substance Use Topics  ? Alcohol use: No  ?  Alcohol/week: 0.0 standard drin

## 2021-07-07 NOTE — Assessment & Plan Note (Signed)
CHRPE #1 temporally and inferiorly OS to protect against severe NPDR progression to PDR ?

## 2021-07-28 DIAGNOSIS — E1140 Type 2 diabetes mellitus with diabetic neuropathy, unspecified: Secondary | ICD-10-CM | POA: Diagnosis not present

## 2021-07-28 DIAGNOSIS — G629 Polyneuropathy, unspecified: Secondary | ICD-10-CM | POA: Diagnosis not present

## 2021-07-28 DIAGNOSIS — E1129 Type 2 diabetes mellitus with other diabetic kidney complication: Secondary | ICD-10-CM | POA: Diagnosis not present

## 2021-07-28 DIAGNOSIS — Z794 Long term (current) use of insulin: Secondary | ICD-10-CM | POA: Diagnosis not present

## 2021-07-28 DIAGNOSIS — Z720 Tobacco use: Secondary | ICD-10-CM | POA: Diagnosis not present

## 2021-07-28 DIAGNOSIS — E039 Hypothyroidism, unspecified: Secondary | ICD-10-CM | POA: Diagnosis not present

## 2021-07-28 DIAGNOSIS — I10 Essential (primary) hypertension: Secondary | ICD-10-CM | POA: Diagnosis not present

## 2021-07-28 DIAGNOSIS — F329 Major depressive disorder, single episode, unspecified: Secondary | ICD-10-CM | POA: Diagnosis not present

## 2021-07-28 DIAGNOSIS — E785 Hyperlipidemia, unspecified: Secondary | ICD-10-CM | POA: Diagnosis not present

## 2021-08-02 ENCOUNTER — Ambulatory Visit (INDEPENDENT_AMBULATORY_CARE_PROVIDER_SITE_OTHER): Payer: Medicare Other | Admitting: Podiatry

## 2021-08-02 DIAGNOSIS — B351 Tinea unguium: Secondary | ICD-10-CM

## 2021-08-02 DIAGNOSIS — M79674 Pain in right toe(s): Secondary | ICD-10-CM

## 2021-08-02 DIAGNOSIS — M79675 Pain in left toe(s): Secondary | ICD-10-CM | POA: Diagnosis not present

## 2021-08-02 DIAGNOSIS — L97512 Non-pressure 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: Secondary | ICD-10-CM | POA: Diagnosis not present

## 2021-08-02 DIAGNOSIS — E0843 Diabetes mellitus due to underlying condition with diabetic autonomic (poly)neuropathy: Secondary | ICD-10-CM

## 2021-08-02 NOTE — Progress Notes (Signed)
Subjective:  66 y.o. female with PMHx of diabetes mellitus presenting today for follow-up evaluation of an ulcer to the plantar aspect of the first MTP joint right foot.  She has been applying the gentamicin cream and a light dressing as well as wearing her diabetic insoles.  Patient is also requesting a nail trim today.  She complains of thickened elongated toenails and she is diabetic and unable to trim her own nails.  She presents for further treatment and evaluation   Past Medical History:  Diagnosis Date   Anxiety    Arthritis    knees   Coronary artery disease    Depression    Fatty liver    History of nuclear stress test 12/16/2011   exercise myoview; normal images with 2-19m ST-segment depression - subsequent cath revelaed subtotally occluded small 2nd marginal branch & 75% PDA lesion, normal LV function   Hypertension    Hypothyroidism    Neuropathy    in feet   OSA on CPAP    AHI = 44 (per patient) setting 13 per pt   Personal history of kidney stones    Primary localized osteoarthritis of left knee 04/24/2015   Primary localized osteoarthritis of right knee 07/02/2015   Tobacco abuse    Type 2 diabetes mellitus (HCC)    insulin pump   Past Surgical History:  Procedure Laterality Date   3rd right toe amputation  02/2016   AMPUTATION Right 12/22/2014   Procedure: RIGHT 2ND TOE AMPUTATION;  Surgeon: JWylene Simmer MD;  [facility]  Service: Orthopedics;  Laterality: Right;   AUGMENTATION MAMMAPLASTY Bilateral    BREAST ENHANCEMENT SURGERY Bilateral    CARDIAC CATHETERIZATION  01/04/2012   subtotally occluded small 2nd marginal branch & 75% PDA lesion, normal LV function   CATARACT EXTRACTION Bilateral    with lens implants   CHOLECYSTECTOMY  2008   COLONOSCOPY     COLONOSCOPY WITH PROPOFOL N/A 05/12/2016   Procedure: COLONOSCOPY WITH PROPOFOL;  Surgeon: JJuanita Craver MD;  [facility]: [facility];  Service: Endoscopy;  Laterality: N/A;   I & D KNEE WITH POLY EXCHANGE  Left 05/13/2015   Procedure: IRRIGATION AND DEBRIDEMENT KNEE WITH POLY EXCHANGE;  Surgeon: JMarchia Bond MD;  [facility]  Service: Orthopedics;  Laterality: Left;   LEFT HEART CATHETERIZATION WITH CORONARY ANGIOGRAM N/A 01/04/2012   Procedure: LEFT HEART CATHETERIZATION WITH CORONARY ANGIOGRAM;  Surgeon: JLorretta Harp MD;  [facility];  Service: Cardiovascular;  Laterality: N/A;   PARTIAL KNEE ARTHROPLASTY Left 04/24/2015   Procedure: LEFT UNICOMPARTMENTAL KNEE ARTHROPLASTY ;  Surgeon: JMarchia Bond MD;  [facility]  Service: Orthopedics;  Laterality: Left;   PARTIAL KNEE ARTHROPLASTY Right 07/02/2015   Procedure: RIGHT UNI KNEE ARTHROPLASTY;  Surgeon: JMarchia Bond MD;  [facility]  Service: Orthopedics;  Laterality: Right;  ANESTHESIA:  GENERAL, PRE/POST OP FEMORAL NERVE   POSTERIOR CERVICAL FUSION/FORAMINOTOMY  1990   ROBOTIC ASSISTED TOTAL HYSTERECTOMY WITH BILATERAL SALPINGO OOPHERECTOMY Bilateral 02/17/2015   Procedure: ROBOTIC ASSISTED TOTAL HYSTERECTOMY WITH BILATERAL SALPINGO OOPHORECTOMY;  Surgeon: EEveritt Amber MD;  [facility];  Service: Gynecology;  Laterality: Bilateral;   Allergies  Allergen Reactions   Penicillins Hives    Has patient had a PCN reaction causing immediate rash, facial/tongue/throat swelling, SOB or lightheadedness with hypotension: Unknown Has patient had a PCN reaction causing severe rash involving mucus membranes or skin necrosis: Unknown Has patient had a PCN reaction that required hospitalization:  Unknown Has patient had a PCN reaction occurring within the last 10 years: No If all of the above answers are "NO", then may proceed with Cephalosporin use.   Objective/Physical Exam General: The patient is alert and oriented x3 in no acute distress.  Dermatology:  Significant improvement of the wound noted.  Wound #1 to the plantar aspect of the right great toe joint measuring approximately  0.3 x 0.2 x 0.2 cm cm (LxWxD).  The ulcer was visible after debridement of the overlying callus tissue.  There was some serosanguineous drainage noted.  No malodor.  There is no exposed bone muscle tendon ligament or joint. Clinically there does not appear to be any evidence of infection.  There is no erythema or edema around the toe. Skin is warm, dry and supple bilateral lower extremities.  Vascular: Palpable pedal pulses bilaterally. No edema or erythema noted. Capillary refill within normal limits.  Neurological: Epicritic and protective threshold diminished bilaterally.   Musculoskeletal Exam: History of prior toe amputations 2, 3 RT foot.   Assessment: 1.  Ulcer plantar aspect of the first MTP right secondary to diabetes mellitus 2. diabetes mellitus w/ peripheral neuropathy 3.  Pain due to onychomycosis of toenails both   Plan of Care:  1. Patient was evaluated. 2. medically necessary excisional debridement including subcutaneous tissue was performed using a tissue nipper and a chisel blade. Excisional debridement of all the necrotic nonviable tissue down to healthy bleeding viable tissue was performed with post-debridement measurements same as pre-. 3. the wound was cleansed and dry sterile dressing applied. 4.  Continue gentamicin cream with a light dressing 5.  Continue wearing diabetic shoes and insoles.  Offloading felt dancers pads were applied to the insoles of the shoe to offload pressure from the first MTP joint 6.  Mechanical debridement of nails 1-5 bilateral was performed using a nail nipper without incident or bleeding  7.  Patient is to return to clinic in 4 weeks   Edrick Kins, DPM [facility]  Dr. Edrick Kins, DPM    [address], [place] [address]                Office [phone]  Fax [phone]

## 2021-08-12 ENCOUNTER — Ambulatory Visit (INDEPENDENT_AMBULATORY_CARE_PROVIDER_SITE_OTHER): Payer: Medicare Other | Admitting: Licensed Clinical Social Worker

## 2021-08-12 DIAGNOSIS — F331 Major depressive disorder, recurrent, moderate: Secondary | ICD-10-CM

## 2021-08-12 NOTE — Progress Notes (Signed)
Virtual Visit via Video Note  I connected with [name] on 08/12/21 at  8:00 AM EDT by a video enabled telemedicine application and verified that I am speaking with the correct person using two identifiers.  Location: Patient: home Provider: [place]   I discussed the limitations of evaluation and management by telemedicine and the availability of in person appointments. The patient expressed understanding and agreed to proceed.   I discussed the assessment and treatment plan with the patient. The patient was provided an opportunity to ask questions and all were answered. The patient agreed with the plan and demonstrated an understanding of the instructions.   The patient was advised to call back or seek an in-person evaluation if the symptoms worsen or if the condition fails to improve as anticipated.  I provided 60 minutes of non-face-to-face time during this encounter.   [name] R Jhana Giarratano, LCSW   THERAPIST PROGRESS NOTE  Session Time: 8-9am  Participation Level: Active  Behavioral Response: NAAlertDepressed  Type of Therapy: Individual Therapy  Treatment Goals addressed: Problem: Depression Goal:  Decrease depressive symptoms and improve levels of effective functioning-pt reports a decrease in overall depression symptoms 3 out of 5 sessions documented.  Outcome: Progressing Goal: Develop healthy thinking patterns and beliefs about self, others, and the world that lead to the alleviation and help prevent the relapse of depression per self report 3 out of 5 sessions documented.   Outcome: Progressing Intervention: Encourage verbalization of feelings/concerns/expectations Note: Explored  Intervention: Encourage self-care activities Note: Reviewed  Intervention: Encourage compliance with prescribed medication regimen Note: Reviewed--pt compliant Intervention: Encourage patient to set small goals for self Note: Reviewed  Intervention: REVIEW PLEASE SKILLS  (TREAT PHYSICAL ILLNESS, BALANCE EATING, AVOID MOOD-ALTERING SUBSTANCES, BALANCE SLEEP AND GET EXERCISE) WITH Aas[place] Note: Reviewed   ProgressTowards Goals: Progressing  Interventions: CBT, Supportive, and Reframing  Summary: [name] is a 66 y.o. female who presents with continuing symptoms associated with depression diagnosis.   Allowed pt to explore and express thoughts and feelings associated with recent life situations and external stressors.Patient is currently taking cymbalta prescribed by her primary care physician. Patient reports that she is compliant with her medication. Patient reports good quality and quantity of sleep at time of session.  Patient reports that sometimes she feels that "life is a disappointment" .  When asked to explore this, patient reports that she feels her marriage is not meeting her expectations. Allow patient safe space to explore her expectations and how reality differs from the expectations. Discussed evolution of relationships, and how relationships change over time period patient reports that she has been married for 45 years. Allow patient to explore strengths in the relationship, and little things that each partner does for each other on a daily basis. Encouraged more together time.  Patient reports that she is engaging socially with others, including going out with friends and attending church services on a regular basis.  Patient reports that she's continuing to be a very close caregiver of her sister, Freda Munro. Patient reports that Freda Munro is more stable at this time but continues to be a stressor for her.  Allow patient to identify activities that she enjoys which include gardening, bird watching, and cooking. Encourage patient to continue engaging in the activities that make her feel good about herself and that she enjoys. Discussed mastery tasks and how they can make one feel accomplished when completed. Encouraged daily structure, activity,  and social engagement on a regular basis. Patient reflects  understanding and willingness to cooperate. Patient states that she has been engaging in chair yoga activities and is very proud about that.  Continued recommendations are as follows: self care behaviors, positive social engagements, focusing on overall work/home/life balance, and focusing on positive physical and emotional wellness.   Suicidal/Homicidal: No  Therapist Response: Pt is continuing to apply interventions learned in session into daily life situations. Pt is currently on track to meet goals utilizing interventions mentioned above. Personal growth and progress noted. Treatment to continue as indicated.   Plan: Return again in 4-5 weeks.  Diagnosis: MDD (major depressive disorder), recurrent episode, moderate ([place])  Collaboration of Care: Other pt to continue follow ups with PCP Dr. [facility]  Patient/Guardian was advised Release of Information must be obtained prior to any record release in order to collaborate their care with an outside provider. Patient/Guardian was advised if they have not already done so to contact the registration department to sign all necessary forms in order for [place] to release information regarding their care.   Consent: Patient/Guardian gives verbal consent for treatment and assignment of benefits for services provided during this visit. Patient/Guardian expressed understanding and agreed to proceed.   [place], LCSW 08/12/2021

## 2021-08-12 NOTE — Plan of Care (Signed)
  Problem: Depression Goal:  Decrease depressive symptoms and improve levels of effective functioning-pt reports a decrease in overall depression symptoms 3 out of 5 sessions documented.  Outcome: Progressing Goal: Develop healthy thinking patterns and beliefs about self, others, and the world that lead to the alleviation and help prevent the relapse of depression per self report 3 out of 5 sessions documented.   Outcome: Progressing Intervention: Encourage verbalization of feelings/concerns/expectations Note: Explored  Intervention: Encourage self-care activities Note: Reviewed  Intervention: Encourage compliance with prescribed medication regimen Note: Reviewed--pt compliant Intervention: Encourage patient to set small goals for self Note: Reviewed  Intervention: REVIEW PLEASE SKILLS (TREAT PHYSICAL ILLNESS, BALANCE EATING, AVOID MOOD-ALTERING SUBSTANCES, BALANCE SLEEP AND GET EXERCISE) WITH [name] Note: Reviewed

## 2021-09-01 ENCOUNTER — Ambulatory Visit (INDEPENDENT_AMBULATORY_CARE_PROVIDER_SITE_OTHER): Payer: Medicare Other | Admitting: Podiatry

## 2021-09-01 ENCOUNTER — Ambulatory Visit (INDEPENDENT_AMBULATORY_CARE_PROVIDER_SITE_OTHER): Payer: Medicare Other

## 2021-09-01 DIAGNOSIS — L97512 Non-pressure 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: Secondary | ICD-10-CM | POA: Diagnosis not present

## 2021-09-01 DIAGNOSIS — E0843 Diabetes mellitus due to underlying condition with diabetic autonomic (poly)neuropathy: Secondary | ICD-10-CM

## 2021-09-01 DIAGNOSIS — L03115 Cellulitis of right lower limb: Secondary | ICD-10-CM

## 2021-09-01 MED ORDER — DOXYCYCLINE HYCLATE 100 MG PO TABS: 100.0000 mg | ORAL_TABLET | Freq: Two times a day (BID) | ORAL | 0 refills | Status: DC

## 2021-09-01 NOTE — Progress Notes (Signed)
Subjective:  66 y.o. female with PMHx of diabetes mellitus presenting today for follow-up evaluation of an ulcer to the plantar aspect of the first MTP joint right foot.  Patient is concerned because she has noticed an odor and progressive deterioration of the wound to the plantar aspect of the right foot.  She states that she discontinued applying the gentamicin cream.  She states also that she has been very active working in her garden and being very active throughout the day.  Currently not on antibiotics.   Past Medical History:  Diagnosis Date   Anxiety    Arthritis    knees   Coronary artery disease    Depression    Fatty liver    History of nuclear stress test 12/16/2011   exercise myoview; normal images with 2-19m ST-segment depression - subsequent cath revelaed subtotally occluded small 2nd marginal branch & 75% PDA lesion, normal LV function   Hypertension    Hypothyroidism    Neuropathy    in feet   OSA on CPAP    AHI = 44 (per patient) setting 13 per pt   Personal history of kidney stones    Primary localized osteoarthritis of left knee 04/24/2015   Primary localized osteoarthritis of right knee 07/02/2015   Tobacco abuse    Type 2 diabetes mellitus (HCC)    insulin pump   Past Surgical History:  Procedure Laterality Date   3rd right toe amputation  02/2016   AMPUTATION Right 12/22/2014   Procedure: RIGHT 2ND TOE AMPUTATION;  Surgeon: JWylene Simmer MD;  [facility]  Service: Orthopedics;  Laterality: Right;   AUGMENTATION MAMMAPLASTY Bilateral    BREAST ENHANCEMENT SURGERY Bilateral    CARDIAC CATHETERIZATION  01/04/2012   subtotally occluded small 2nd marginal branch & 75% PDA lesion, normal LV function   CATARACT EXTRACTION Bilateral    with lens implants   CHOLECYSTECTOMY  2008   COLONOSCOPY     COLONOSCOPY WITH PROPOFOL N/A 05/12/2016   Procedure: COLONOSCOPY WITH PROPOFOL;  Surgeon: JJuanita Craver MD;  [facility];  Service: Endoscopy;   Laterality: N/A;   I & D KNEE WITH POLY EXCHANGE Left 05/13/2015   Procedure: IRRIGATION AND DEBRIDEMENT KNEE WITH POLY EXCHANGE;  Surgeon: JMarchia Bond MD;  [facility]  Service: Orthopedics;  Laterality: Left;   LEFT HEART CATHETERIZATION WITH CORONARY ANGIOGRAM N/A 01/04/2012   Procedure: LEFT HEART CATHETERIZATION WITH CORONARY ANGIOGRAM;  Surgeon: JLorretta Harp MD;  [facility];  Service: Cardiovascular;  Laterality: N/A;   PARTIAL KNEE ARTHROPLASTY Left 04/24/2015   Procedure: LEFT UNICOMPARTMENTAL KNEE ARTHROPLASTY ;  Surgeon: JMarchia Bond MD;  [facility]  Service: Orthopedics;  Laterality: Left;   PARTIAL KNEE ARTHROPLASTY Right 07/02/2015   Procedure: RIGHT UNI KNEE ARTHROPLASTY;  Surgeon: JMarchia Bond MD;  [facility]  Service: Orthopedics;  Laterality: Right;  ANESTHESIA:  GENERAL, PRE/POST OP FEMORAL NERVE   POSTERIOR CERVICAL FUSION/FORAMINOTOMY  1990   ROBOTIC ASSISTED TOTAL HYSTERECTOMY WITH BILATERAL SALPINGO OOPHERECTOMY Bilateral 02/17/2015   Procedure: ROBOTIC ASSISTED TOTAL HYSTERECTOMY WITH BILATERAL SALPINGO OOPHORECTOMY;  Surgeon: EEveritt Amber MD;  [facility];  Service: Gynecology;  Laterality: Bilateral;   Allergies  Allergen Reactions   Penicillins Hives    Has patient had a PCN reaction causing immediate rash, facial/tongue/throat swelling, SOB or lightheadedness with hypotension: Unknown Has patient had a PCN reaction causing severe rash involving mucus membranes or skin necrosis: Unknown Has patient had  a PCN reaction that required hospitalization: Unknown Has patient had a PCN reaction occurring within the last 10 years: No If all of the above answers are "NO", then may proceed with Cephalosporin use.   Objective/Physical Exam General: The patient is alert and oriented x3 in no acute distress.  Dermatology:  Significant improvement of the wound noted.  Wound #1 to the plantar aspect of  the right great toe joint measuring approximately 0.5 x 0.5 x 0.7 cm (LxWxD).  There was some serosanguineous drainage noted.  Mild malodor.  There is some undermining of the wound which is concerning because it possibly probes down to the level of the sesamoidal apparatus.  The wound has progressed significantly since last visit.  Vascular: Palpable pedal pulses bilaterally.  Erythema with edema noted localized around the first MTP joint of the right foot capillary refill within normal limits.  Currently there is no concern clinically for vascular compromise  Neurological: Epicritic and protective threshold absent bilaterally.   Musculoskeletal Exam: History of prior toe amputations 2, 3 RT foot.   Radiographic exam RT foot 09/01/2021: Prior amputations of the second and third digit at the level of the MTP joint noted.  Diffuse degenerative changes with advanced DJD noted throughout the joints of the foot.  Irregularity and sclerosis noted of the sesamoidal apparatus as best as can be visualized.  No acute fractures.  No obvious sign of erosions or cortical irregularities that would be concerning for osteomyelitis.  Assessment: 1.  Ulcer plantar aspect of the first MTP right secondary to diabetes mellitus with surrounding cellulitis 2. diabetes mellitus w/ peripheral neuropathy 3.  Concern for possible osteomyelitis of the sesamoids due to the depth and undermining of the wound   Plan of Care:  1. Patient was evaluated.  X-rays reviewed 2. medically necessary excisional debridement including subcutaneous tissue was performed using a tissue nipper and a chisel blade. Excisional debridement of all the necrotic nonviable tissue down to healthy bleeding viable tissue was performed with post-debridement measurements same as pre-. 3. the wound was cleansed and dry sterile dressing applied. 4.  Iodosorb provided for the patient to apply daily with a light dressing 5.  Cultures taken and sent to  pathology for culture and sensitivity 6.  Prescription for doxycycline 100 mg 2 times daily #20 7.  Discontinue diabetic shoe.  Patient has a postsurgical shoe at home.  Minimal weightbearing 8.  Due to the depth and concern for possible osteomyelitis MRI was ordered right foot 9.  Return to clinic 2 weeks  Edrick Kins, DPM [facility]  Dr. Edrick Kins, DPM    [address], [place] [address]                Office [phone]  Fax [phone]

## 2021-09-02 ENCOUNTER — Encounter (INDEPENDENT_AMBULATORY_CARE_PROVIDER_SITE_OTHER): Payer: Self-pay | Admitting: Ophthalmology

## 2021-09-02 ENCOUNTER — Ambulatory Visit (INDEPENDENT_AMBULATORY_CARE_PROVIDER_SITE_OTHER): Payer: Medicare Other | Admitting: Ophthalmology

## 2021-09-02 DIAGNOSIS — H35033 Hypertensive retinopathy, bilateral: Secondary | ICD-10-CM | POA: Insufficient documentation

## 2021-09-02 DIAGNOSIS — E113412 Type 2 diabetes mellitus with severe nonproliferative diabetic retinopathy with macular edema, left eye: Secondary | ICD-10-CM

## 2021-09-02 DIAGNOSIS — E113411 Type 2 diabetes mellitus with severe nonproliferative diabetic retinopathy with macular edema, right eye: Secondary | ICD-10-CM | POA: Diagnosis not present

## 2021-09-02 DIAGNOSIS — H43823 Vitreomacular adhesion, bilateral: Secondary | ICD-10-CM | POA: Diagnosis not present

## 2021-09-02 NOTE — Assessment & Plan Note (Signed)
Observe

## 2021-09-02 NOTE — Progress Notes (Signed)
09/02/2021     CHIEF COMPLAINT Patient presents for  Chief Complaint  Patient presents with   Diabetic Retinopathy without Macular Edema      HISTORY OF PRESENT ILLNESS: [name] is a 66 y.o. female who presents to the clinic today for:   HPI   8 weeks dilate ou, color fp Pt states her vision has been stable Pt admits to a little FOL pt states "its like little stars of light"  Last edited by Morene Rankins, CMA on 09/02/2021  1:32 PM.      Referring physician: Burnard Bunting, MD [place],  [place] [id number]  HISTORICAL INFORMATION:   Selected notes from the [facility]    Lab Results  Component Value Date   HGBA1C 8.0 (H) 11/13/2016     CURRENT MEDICATIONS: No current outpatient medications on file. (Ophthalmic Drugs)   No current facility-administered medications for this visit. (Ophthalmic Drugs)   Current Outpatient Medications (Other)  Medication Sig   ACCU-CHEK AVIVA PLUS test strip    ALPRAZolam (XANAX) 0.5 MG tablet Take 0.5 mg by mouth at bedtime as needed for anxiety.    alum & mag hydroxide-simeth (MAALOX/MYLANTA) 200-200-20 MG/5ML suspension Take 15 mLs by mouth every 4 (four) hours as needed for indigestion or heartburn.   atorvastatin (LIPITOR) 20 MG tablet Take 20 mg by mouth daily.   atorvastatin (LIPITOR) 20 MG tablet Take 1 tablet by mouth daily.   buPROPion (WELLBUTRIN SR) 100 MG 12 hr tablet Take 100 mg by mouth every 12 (twelve) hours.   buPROPion (WELLBUTRIN XL) 150 MG 24 hr tablet TAKE 1 TABLET BY MOUTH EVERY DAY IN THE MORNING   Cholecalciferol (VITAMIN D PO) Take by mouth.   clotrimazole-betamethasone (LOTRISONE) cream Apply 1 application topically 2 (two) times daily.   Continuous Blood Gluc Receiver (FREESTYLE LIBRE 14 DAY READER) DEVI Use to monitor blood glucose   Continuous Blood Gluc Sensor (FREESTYLE LIBRE 14 DAY SENSOR) MISC Use to monitor blood glucose continuously-Dx code E11.29   doxycycline  (VIBRA-TABS) 100 MG tablet Take 1 tablet (100 mg total) by mouth 2 (two) times daily.   DULoxetine (CYMBALTA) 60 MG capsule Take 60 mg by mouth daily.   escitalopram (LEXAPRO) 20 MG tablet Take 20 mg by mouth daily.   gabapentin (NEURONTIN) 300 MG capsule Take 600 mg by mouth 3 (three) times daily.    gabapentin (NEURONTIN) 300 MG capsule Take 2 capsules by mouth 3 (three) times daily.   gentamicin ointment (GARAMYCIN) 0.1 % Apply topically 3 (three) times daily.   Insulin Human (INSULIN PUMP) SOLN Inject 1 each into the skin continuous. Humalog - basal rate 3.2 Units/hr (Patient taking differently: Inject 1 each into the skin continuous. Novolog - basal rate 3.2 units/hr)   levothyroxine (SYNTHROID, LEVOTHROID) 125 MCG tablet Take 125 mcg by mouth daily.   levothyroxine (SYNTHROID, LEVOTHROID) 137 MCG tablet    losartan-hydrochlorothiazide (HYZAAR) 100-12.5 MG tablet Take 1 tablet by mouth daily.   NOVOLOG 100 UNIT/ML injection UTD INJ UP TO A MAX OF 122 UNITS [place] PER DAY VIA INSULIN PUMP   ondansetron (ZOFRAN) 4 MG tablet Take 1 tablet (4 mg total) by mouth every 6 (six) hours as needed for nausea.   OVER THE COUNTER MEDICATION Pt uses CPAP machine daily.   oxyCODONE-acetaminophen (PERCOCET) 5-325 MG tablet Take 1 tablet by mouth every 6 (six) hours as needed for severe pain.   PAXLOVID, 300/100, 20 x 150 MG & 10 x  $'100MG'f$  TBPK Take by mouth.   Probiotic CAPS Take 1 capsule by mouth daily.   silver sulfADIAZINE (SILVADENE) 1 % cream Apply 1 application topically daily.   No current facility-administered medications for this visit. (Other)      REVIEW OF SYSTEMS: ROS   Negative for: Constitutional, Gastrointestinal, Neurological, Skin, Genitourinary, Musculoskeletal, HENT, Endocrine, Cardiovascular, Eyes, Respiratory, Psychiatric, Allergic/Imm, Heme/Lymph Last edited by Morene Rankins, CMA on 09/02/2021  1:32 PM.       ALLERGIES Allergies  Allergen Reactions   Penicillins Hives     Has patient had a PCN reaction causing immediate rash, facial/tongue/throat swelling, SOB or lightheadedness with hypotension: Unknown Has patient had a PCN reaction causing severe rash involving mucus membranes or skin necrosis: Unknown Has patient had a PCN reaction that required hospitalization: Unknown Has patient had a PCN reaction occurring within the last 10 years: No If all of the above answers are "NO", then may proceed with Cephalosporin use.    PAST MEDICAL HISTORY Past Medical History:  Diagnosis Date   Anxiety    Arthritis    knees   Coronary artery disease    Depression    Fatty liver    History of nuclear stress test 12/16/2011   exercise myoview; normal images with 2-30m ST-segment depression - subsequent cath revelaed subtotally occluded small 2nd marginal branch & 75% PDA lesion, normal LV function   Hypertension    Hypothyroidism    Neuropathy    in feet   OSA on CPAP    AHI = 44 (per patient) setting 13 per pt   Personal history of kidney stones    Primary localized osteoarthritis of left knee 04/24/2015   Primary localized osteoarthritis of right knee 07/02/2015   Tobacco abuse    Type 2 diabetes mellitus (HCC)    insulin pump   Past Surgical History:  Procedure Laterality Date   3rd right toe amputation  02/2016   AMPUTATION Right 12/22/2014   Procedure: RIGHT 2ND TOE AMPUTATION;  Surgeon: JWylene Simmer MD;  [facility]  Service: Orthopedics;  Laterality: Right;   AUGMENTATION MAMMAPLASTY Bilateral    BREAST ENHANCEMENT SURGERY Bilateral    CARDIAC CATHETERIZATION  01/04/2012   subtotally occluded small 2nd marginal branch & 75% PDA lesion, normal LV function   CATARACT EXTRACTION Bilateral    with lens implants   CHOLECYSTECTOMY  2008   COLONOSCOPY     COLONOSCOPY WITH PROPOFOL N/A 05/12/2016   Procedure: COLONOSCOPY WITH PROPOFOL;  Surgeon: JJuanita Craver MD;  [facility];  Service: Endoscopy;  Laterality: N/A;   I & D KNEE WITH POLY  EXCHANGE Left 05/13/2015   Procedure: IRRIGATION AND DEBRIDEMENT KNEE WITH POLY EXCHANGE;  Surgeon: JMarchia Bond MD;  [facility]  Service: Orthopedics;  Laterality: Left;   LEFT HEART CATHETERIZATION WITH CORONARY ANGIOGRAM N/A 01/04/2012   Procedure: LEFT HEART CATHETERIZATION WITH CORONARY ANGIOGRAM;  Surgeon: JLorretta Harp MD;  [facility];  Service: Cardiovascular;  Laterality: N/A;   PARTIAL KNEE ARTHROPLASTY Left 04/24/2015   Procedure: LEFT UNICOMPARTMENTAL KNEE ARTHROPLASTY ;  Surgeon: JMarchia Bond MD;  [facility]  Service: Orthopedics;  Laterality: Left;   PARTIAL KNEE ARTHROPLASTY Right 07/02/2015   Procedure: RIGHT UNI KNEE ARTHROPLASTY;  Surgeon: JMarchia Bond MD;  [facility]  Service: Orthopedics;  Laterality: Right;  ANESTHESIA:  GENERAL, PRE/POST OP FEMORAL NERVE   POSTERIOR CERVICAL FUSION/FORAMINOTOMY  1990   ROBOTIC ASSISTED TOTAL HYSTERECTOMY WITH  BILATERAL SALPINGO OOPHERECTOMY Bilateral 02/17/2015   Procedure: ROBOTIC ASSISTED TOTAL HYSTERECTOMY WITH BILATERAL SALPINGO OOPHORECTOMY;  Surgeon: Everitt Amber, MD;  [facility];  Service: Gynecology;  Laterality: Bilateral;    FAMILY HISTORY Family History  Problem Relation Age of Onset   Stroke Mother    Hypertension Mother    Diabetes Mother    Alzheimer's disease Mother    Diabetes Father    COPD Father        vent-dependent, MODS   Hypertension Sister    Mental illness Sister        borderline personality d/o   Mental illness Sister        schizoeffective d/o   Diabetes Sister     SOCIAL HISTORY Social History   Tobacco Use   Smoking status: Former    Packs/day: 1.00    Years: 25.00    Total pack years: 25.00    Types: Cigarettes   Smokeless tobacco: Never   Tobacco comments:    quit April 2021  Vaping Use   Vaping Use: Never used  Substance Use Topics   Alcohol use: No    Alcohol/week: 0.0 standard drinks of alcohol   Drug  use: No         OPHTHALMIC EXAM:  Base Eye Exam     Visual Acuity (ETDRS)       Right Left   Dist [place] 20/20 20/20         Tonometry (Tonopen, 1:37 PM)       Right Left   Pressure 11 18         Neuro/Psych     Oriented x3: Yes   Mood/Affect: Normal         Dilation     Both eyes: 1.0% Mydriacyl, 2.5% Phenylephrine @ 1:35 PM           Slit Lamp and Fundus Exam     External Exam       Right Left   External Normal Normal         Slit Lamp Exam       Right Left   Lids/Lashes Normal Normal   Conjunctiva/Sclera White and quiet White and quiet   Cornea Clear Clear   Anterior Chamber Deep and quiet Deep and quiet   Iris Round and reactive Round and reactive   Lens Centered posterior chamber intraocular lens Centered posterior chamber intraocular lens   Anterior Vitreous Normal Normal         Fundus Exam       Right Left   Posterior Vitreous Normal Normal   Disc Normal Normal   C/D Ratio 0.4 0.45   Macula Microaneurysms, Exudates, Macular thickening Microaneurysms   Vessels NPDR-Severe, watch temporal watershed large dot blot hemorrhages present signifying nonperfusion, AV nicking NPDR-Severe, AV nicking   Periphery Good PRP inferior nasal Good PRP inferior and temporal            IMAGING AND PROCEDURES  Imaging and Procedures for 09/02/21  Color Fundus Photography Optos - OU - Both Eyes       Right Eye Progression has no prior data. Disc findings include normal observations. Macula : edema, exudates, microaneurysms. Periphery : normal observations.   Left Eye Progression has no prior data. Disc findings include normal observations. Macula : microaneurysms. Periphery : normal observations.   Notes   OU with severe NPDR PRP inferior and nasally ,OD,  and inferiorly and temporally OS.  Watch temporal watershed in each eye.  ASSESSMENT/PLAN:  Severe nonproliferative diabetic retinopathy of right eye, with  macular edema, associated with type 2 diabetes mellitus (HCC) CSME condition has improved.  We will continue to monitor severe NPDR.  PRP #1 assaulted progression to PDR we will continue to observe  Severe nonproliferative diabetic retinopathy of left eye (HCC) OS, PRP successful at preventing progression of PDR OS.  We will continue to monitor and observe  Vitreomacular adhesion of both eyes OU continue to observe  Hypertensive retinopathy of both eyes, grade 2 Observe     ICD-10-CM   1. Severe nonproliferative diabetic retinopathy of left eye with macular edema associated with type 2 diabetes mellitus (HCC)  D14.9702 Color Fundus Photography Optos - OU - Both Eyes    2. Severe nonproliferative diabetic retinopathy of right eye, with macular edema, associated with type 2 diabetes mellitus ([place])  E11.3411     3. Vitreomacular adhesion of both eyes  H43.823     4. Hypertensive retinopathy of both eyes, grade 2  H35.033       1.  OU doing well with stable acuity.  OD, severe NPDR controlled yet we will need to watch the watershed region temporally OD  2.  OS no sign of progression of PDR.  We will continue to monitor and observe  3.  Ophthalmic Meds Ordered this visit:  No orders of the defined types were placed in this encounter.      Return in about 3 months (around 12/03/2021) for DILATE OU, COLOR FP, OCT.  There are no Patient Instructions on file for this visit.   Explained the diagnoses, plan, and follow up with the patient and they expressed understanding.  Patient expressed understanding of the importance of proper follow up care.   Clent Demark Soniya Ashraf M.D. Diseases & Surgery of the Retina and Vitreous Retina & Diabetic [place] 09/02/21     Abbreviations: M myopia (nearsighted); A astigmatism; H hyperopia (farsighted); P presbyopia; Mrx spectacle prescription;  CTL contact lenses; OD right eye; OS left eye; OU both eyes  XT exotropia; ET esotropia; PEK punctate  epithelial keratitis; PEE punctate epithelial erosions; DES dry eye syndrome; MGD meibomian gland dysfunction; ATs artificial tears; PFAT's preservative free artificial tears; [place] nuclear sclerotic cataract; PSC posterior subcapsular cataract; ERM epi-retinal membrane; PVD posterior vitreous detachment; RD retinal detachment; DM diabetes mellitus; DR diabetic retinopathy; NPDR non-proliferative diabetic retinopathy; PDR proliferative diabetic retinopathy; CSME clinically significant macular edema; DME diabetic macular edema; dbh dot blot hemorrhages; CWS cotton wool spot; POAG primary open angle glaucoma; C/D cup-to-disc ratio; HVF humphrey visual field; GVF goldmann visual field; OCT optical coherence tomography; IOP intraocular pressure; BRVO Branch retinal vein occlusion; CRVO central retinal vein occlusion; CRAO central retinal artery occlusion; BRAO branch retinal artery occlusion; RT retinal tear; SB scleral buckle; PPV pars plana vitrectomy; VH Vitreous hemorrhage; PRP panretinal laser photocoagulation; IVK intravitreal kenalog; VMT vitreomacular traction; MH Macular hole;  NVD neovascularization of the disc; NVE neovascularization elsewhere; AREDS age related eye disease study; ARMD age related macular degeneration; POAG primary open angle glaucoma; EBMD epithelial/anterior basement membrane dystrophy; ACIOL anterior chamber intraocular lens; IOL intraocular lens; PCIOL posterior chamber intraocular lens; Phaco/IOL phacoemulsification with intraocular lens placement; [place] photorefractive keratectomy; LASIK laser assisted in situ keratomileusis; HTN hypertension; DM diabetes mellitus; COPD chronic obstructive pulmonary disease

## 2021-09-02 NOTE — Assessment & Plan Note (Signed)
CSME condition has improved.  We will continue to monitor severe NPDR.  PRP #1 assaulted progression to PDR we will continue to observe

## 2021-09-02 NOTE — Assessment & Plan Note (Signed)
OU continue to observe

## 2021-09-02 NOTE — Assessment & Plan Note (Signed)
OS, PRP successful at preventing progression of PDR OS.  We will continue to monitor and observe

## 2021-09-04 ENCOUNTER — Ambulatory Visit
Admission: RE | Admit: 2021-09-04 | Discharge: 2021-09-04 | Disposition: A | Discharge status: Home / Self Care | Payer: Medicare Other | Source: Ambulatory Visit | Attending: Podiatry | Admitting: Podiatry

## 2021-09-04 DIAGNOSIS — M2011 Hallux valgus (acquired), right foot: Secondary | ICD-10-CM | POA: Diagnosis not present

## 2021-09-04 DIAGNOSIS — L97512 Non-pressure 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: Secondary | ICD-10-CM

## 2021-09-04 DIAGNOSIS — M7989 Other specified soft tissue disorders: Secondary | ICD-10-CM | POA: Diagnosis not present

## 2021-09-04 DIAGNOSIS — S91301A Unspecified open wound, right foot, initial encounter: Secondary | ICD-10-CM | POA: Diagnosis not present

## 2021-09-04 LAB — WOUND CULTURE
MICRO NUMBER:: 13638703
SPECIMEN QUALITY:: ADEQUATE

## 2021-09-07 ENCOUNTER — Telehealth: Payer: Self-pay | Admitting: Podiatry

## 2021-09-07 NOTE — Telephone Encounter (Signed)
Patient called this morning she changed the bandage on her wound, and it is red and had pus coming from it.  She is currently  on  doxycycline (VIBRA-TABS) 100 MG tablet [[id number]]  , what do you advise ?  Should she come in earlier or keep the 09/15/21 appt    Patient wants to know if you got the MRI results yet .

## 2021-09-07 NOTE — Telephone Encounter (Signed)
Spoke with patient about the MRI report.  Negative for osteo. Also Adonis Brook, I reached out to [place], [employer] rep for the [employer] for diabetic foot ulcers.  She should be emailing you a form to fill out to get her approved for the boot.  Thanks, [name]

## 2021-09-07 NOTE — Telephone Encounter (Signed)
[facility] called to find out if we have received the MRI report for patient. I spoke with patient and imaging to reassure them that MRI has been received by our office. The Patient would like to have a call about her results asap. I told her [name] and Nurse was in with a patient but I would give them the message.

## 2021-09-15 ENCOUNTER — Ambulatory Visit (INDEPENDENT_AMBULATORY_CARE_PROVIDER_SITE_OTHER): Payer: Medicare Other | Admitting: Podiatry

## 2021-09-15 DIAGNOSIS — L97512 Non-pressure 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: Secondary | ICD-10-CM

## 2021-09-15 NOTE — Progress Notes (Signed)
Subjective:  66 y.o. female with PMHx of diabetes mellitus presenting today for follow-up evaluation of an ulcer to the plantar aspect of the first MTP joint right foot.  Patient has been applying the silver alginate to the wound daily.  No new complaints at this time  Past Medical History:  Diagnosis Date   Anxiety    Arthritis    knees   Coronary artery disease    Depression    Fatty liver    History of nuclear stress test 12/16/2011   exercise myoview; normal images with 2-51m ST-segment depression - subsequent cath revelaed subtotally occluded small 2nd marginal branch & 75% PDA lesion, normal LV function   Hypertension    Hypothyroidism    Neuropathy    in feet   OSA on CPAP    AHI = 44 (per patient) setting 13 per pt   Personal history of kidney stones    Primary localized osteoarthritis of left knee 04/24/2015   Primary localized osteoarthritis of right knee 07/02/2015   Tobacco abuse    Type 2 diabetes mellitus (HCC)    insulin pump   Past Surgical History:  Procedure Laterality Date   3rd right toe amputation  02/2016   AMPUTATION Right 12/22/2014   Procedure: RIGHT 2ND TOE AMPUTATION;  Surgeon: JWylene Simmer MD;  [facility]  Service: Orthopedics;  Laterality: Right;   AUGMENTATION MAMMAPLASTY Bilateral    BREAST ENHANCEMENT SURGERY Bilateral    CARDIAC CATHETERIZATION  01/04/2012   subtotally occluded small 2nd marginal branch & 75% PDA lesion, normal LV function   CATARACT EXTRACTION Bilateral    with lens implants   CHOLECYSTECTOMY  2008   COLONOSCOPY     COLONOSCOPY WITH PROPOFOL N/A 05/12/2016   Procedure: COLONOSCOPY WITH PROPOFOL;  Surgeon: JJuanita Craver MD;  [facility];  Service: Endoscopy;  Laterality: N/A;   I & D KNEE WITH POLY EXCHANGE Left 05/13/2015   Procedure: IRRIGATION AND DEBRIDEMENT KNEE WITH POLY EXCHANGE;  Surgeon: JMarchia Bond MD;  [facility]  Service: Orthopedics;  Laterality: Left;   LEFT HEART CATHETERIZATION WITH  CORONARY ANGIOGRAM N/A 01/04/2012   Procedure: LEFT HEART CATHETERIZATION WITH CORONARY ANGIOGRAM;  Surgeon: JLorretta Harp MD;  [facility];  Service: Cardiovascular;  Laterality: N/A;   PARTIAL KNEE ARTHROPLASTY Left 04/24/2015   Procedure: LEFT UNICOMPARTMENTAL KNEE ARTHROPLASTY ;  Surgeon: JMarchia Bond MD;  [facility]  Service: Orthopedics;  Laterality: Left;   PARTIAL KNEE ARTHROPLASTY Right 07/02/2015   Procedure: RIGHT UNI KNEE ARTHROPLASTY;  Surgeon: JMarchia Bond MD;  [facility]  Service: Orthopedics;  Laterality: Right;  ANESTHESIA:  GENERAL, PRE/POST OP FEMORAL NERVE   POSTERIOR CERVICAL FUSION/FORAMINOTOMY  1990   ROBOTIC ASSISTED TOTAL HYSTERECTOMY WITH BILATERAL SALPINGO OOPHERECTOMY Bilateral 02/17/2015   Procedure: ROBOTIC ASSISTED TOTAL HYSTERECTOMY WITH BILATERAL SALPINGO OOPHORECTOMY;  Surgeon: EEveritt Amber MD;  [facility];  Service: Gynecology;  Laterality: Bilateral;   Allergies  Allergen Reactions   Penicillins Hives    Has patient had a PCN reaction causing immediate rash, facial/tongue/throat swelling, SOB or lightheadedness with hypotension: Unknown Has patient had a PCN reaction causing severe rash involving mucus membranes or skin necrosis: Unknown Has patient had a PCN reaction that required hospitalization: Unknown Has patient had a PCN reaction occurring within the last 10 years: No If all of the above answers are "NO", then may proceed with Cephalosporin use.      Objective/Physical Exam General:  The patient is alert and oriented x3 in no acute distress.  Dermatology:  Significant improvement of the wound noted.  Wound #1 to the plantar aspect of the right great toe joint measuring approximately 0.5 x 0.5 x 0.7 cm (LxWxD).  There was some serosanguineous drainage noted.  Mild malodor.  There is some undermining of the wound which is concerning because it possibly probes down to the level of  the sesamoidal apparatus.  The wound has progressed significantly since last visit.  Vascular: Palpable pedal pulses bilaterally.  Erythema with edema noted localized around the first MTP joint of the right foot capillary refill within normal limits.  Currently there is no concern clinically for vascular compromise  Neurological: Epicritic and protective threshold absent bilaterally.   Musculoskeletal Exam: History of prior toe amputations 2, 3 RT foot.   Radiographic exam RT foot 09/01/2021: Prior amputations of the second and third digit at the level of the MTP joint noted.  Diffuse degenerative changes with advanced DJD noted throughout the joints of the foot.  Irregularity and sclerosis noted of the sesamoidal apparatus as best as can be visualized.  No acute fractures.  No obvious sign of erosions or cortical irregularities that would be concerning for osteomyelitis.  MR FOOT RT WO CONTRAST 09/04/2021: IMPRESSION: 1. No osteomyelitis of the right forefoot. Soft tissue wound along the plantar aspect of the first MTP joint with surrounding soft tissue edema most consistent with cellulitis. Small amount of fluid superficial to the first MTP joint which may reflect a small abscess measuring 2 x 20 mm. 2. Hallux valgus of the right foot. Moderate osteoarthritis of the first MTP joint. 3. Osteoarthritis of the second through fifth tarsometatarsal joints as described above. 4. Moderate osteoarthritis of the navicular-medial cuneiform joint with subchondral reactive marrow edema.  Assessment: 1.  Ulcer plantar aspect of the first MTP right secondary to diabetes mellitus with surrounding cellulitis 2. diabetes mellitus w/ peripheral neuropathy 3.  Concern for possible osteomyelitis of the sesamoids due to the depth and undermining of the wound   Plan of Care:  1. Patient was evaluated.  MRI and cultures reviewed 2. medically necessary excisional debridement including subcutaneous tissue was  performed using a tissue nipper and a chisel blade. Excisional debridement of all the necrotic nonviable tissue down to healthy bleeding viable tissue was performed with post-debridement measurements same as pre-. 3. the wound was cleansed and dry sterile dressing applied. 4.  Iodosorb provided for the patient to apply daily with a light dressing 5.  Patient completed the oral doxycycline 100 mg 2 times daily #20 as prescribed last visit  6. Defender offloading boot pending authorization 7.  Referral placed to the [facility] wound care center per patient's request.  Their specialty is always greatly appreciated 8.  Return to clinic as needed  Edrick Kins, DPM [facility]  Dr. Edrick Kins, DPM    [address], [place] [address]                Office [phone]  Fax [phone]

## 2021-09-17 ENCOUNTER — Telehealth: Payer: Self-pay | Admitting: Podiatry

## 2021-09-17 ENCOUNTER — Other Ambulatory Visit: Payer: Self-pay | Admitting: Podiatry

## 2021-09-17 MED ORDER — DOXYCYCLINE HYCLATE 100 MG PO TABS: 100.0000 mg | ORAL_TABLET | Freq: Two times a day (BID) | ORAL | 0 refills | Status: DC

## 2021-09-17 NOTE — Telephone Encounter (Signed)
Just to be safe I went ahead and sent in a refill of the antibiotics.  Please notify patient.  Thanks, [name]

## 2021-09-17 NOTE — Telephone Encounter (Signed)
Patient has been notified w/ medication update

## 2021-09-17 NOTE — Telephone Encounter (Signed)
Pt has finished her antibiotics and wanted to know if she is to start another round of antibiotics or is she good for now. Please advise.

## 2021-09-23 ENCOUNTER — Ambulatory Visit (INDEPENDENT_AMBULATORY_CARE_PROVIDER_SITE_OTHER): Payer: Medicare Other | Admitting: Licensed Clinical Social Worker

## 2021-09-23 ENCOUNTER — Telehealth (HOSPITAL_COMMUNITY): Payer: Self-pay | Admitting: Licensed Clinical Social Worker

## 2021-09-23 DIAGNOSIS — F331 Major depressive disorder, recurrent, moderate: Secondary | ICD-10-CM

## 2021-09-23 NOTE — Telephone Encounter (Signed)
LCSW counselor tried to connect with patient for scheduled in office appointment at [place]. Attempted to contact pt at 8:15 am after she did not show in the office to see if there was miscommunication or if pt wanted to switch visit to virtual. There was no answer on the phone and LCSW counselor left message on pt phone to call office back.  The visit will be coded as no show.

## 2021-09-23 NOTE — Addendum Note (Signed)
Addended by: [name] Plant R on: 09/23/2021 09:00 AM   Modules accepted: Level of Service

## 2021-09-23 NOTE — Telephone Encounter (Signed)
*  update:  Pt was lost and came to the office late.  Did partial session and pt will come back later to make up rest of session.

## 2021-09-23 NOTE — Progress Notes (Addendum)
   THERAPIST PROGRESS NOTE  Session Time: 8-9am  Participation Level: Active  Behavioral Response: NAAlertDepressed  Type of Therapy: Individual Therapy  Treatment Goals addressed: Problem: Depression Goal:  Decrease depressive symptoms and improve levels of effective functioning-pt reports a decrease in overall depression symptoms 3 out of 5 sessions documented.  Outcome: Progressing Goal: Develop healthy thinking patterns and beliefs about self, others, and the world that lead to the alleviation and help prevent the relapse of depression per self report 3 out of 5 sessions documented.   Outcome: Progressing Intervention: Encourage verbalization of feelings/concerns/expectations Note: Explored  Intervention: Encourage self-care activities Note: Reviewed  Intervention: Encourage compliance with prescribed medication regimen Note: Reviewed--pt compliant Intervention: Encourage patient to set small goals for self Note: Reviewed  Intervention: REVIEW PLEASE SKILLS (TREAT PHYSICAL ILLNESS, BALANCE EATING, AVOID MOOD-ALTERING SUBSTANCES, BALANCE SLEEP AND GET EXERCISE) WITH [name] Note: Reviewed   ProgressTowards Goals: Progressing  Interventions: CBT, Supportive, and Reframing  Summary: [name] is a 66 y.o. female who presents with continuing symptoms associated with depression diagnosis.   Allowed pt to explore and express thoughts and feelings associated with recent life situations and external stressors.Patient reports that she is being intentional about incorporating physical activity on a routine basis. Patient reports that she is trying chair yoga, and Tai chi. Patient does report that she's noticed that she does not have much motivation and will often start the activities, but stop going to the classes. Allowed patient to recognize and identify barriers to success, and ways that she can knock down those barriers. Patient reported that she feels like she is her own  biggest barrier, and is working on her motivation and initiative to keep moving forward.  Discussed patients relationship with her niece, and associated anxiety that has been triggered from interactions and recent conflict. Allowed patient to recognize elements that she can control, and elements that she has to accept are her nieces control and responsibility. patient reflects understanding and improvement in overall awareness.  Discussed overall health related concerns including diabetes management and self-care behaviors.  Continued recommendations are as follows: self care behaviors, positive social engagements, focusing on overall work/home/life balance, and focusing on positive physical and emotional wellness.   Suicidal/Homicidal: No  Therapist Response: Pt is continuing to apply interventions learned in session into daily life situations. Pt is currently on track to meet goals utilizing interventions mentioned above. Personal growth and progress noted. Treatment to continue as indicated.   Plan: Return again in 8 weeks.  Diagnosis: MDD (major depressive disorder), recurrent episode, moderate ([place])  Collaboration of Care: Other pt to continue follow ups with PCP [name]  Patient/Guardian was advised Release of Information must be obtained prior to any record release in order to collaborate their care with an outside provider. Patient/Guardian was advised if they have not already done so to contact the registration department to sign all necessary forms in order for [place] to release information regarding their care.   Consent: Patient/Guardian gives verbal consent for treatment and assignment of benefits for services provided during this visit. Patient/Guardian expressed understanding and agreed to proceed.   [place], LCSW 09/23/2021

## 2021-09-23 NOTE — Addendum Note (Signed)
Addended by: Granville Lewis on: 09/23/2021 04:24 PM   Modules accepted: Level of Service

## 2021-09-23 NOTE — Plan of Care (Signed)
  Problem: Depression Goal:  Decrease depressive symptoms and improve levels of effective functioning-pt reports a decrease in overall depression symptoms 3 out of 5 sessions documented.  Outcome: Progressing Goal: Develop healthy thinking patterns and beliefs about self, others, and the world that lead to the alleviation and help prevent the relapse of depression per self report 3 out of 5 sessions documented.   Outcome: Progressing   

## 2021-09-29 DIAGNOSIS — E785 Hyperlipidemia, unspecified: Secondary | ICD-10-CM | POA: Diagnosis not present

## 2021-09-29 DIAGNOSIS — I10 Essential (primary) hypertension: Secondary | ICD-10-CM | POA: Diagnosis not present

## 2021-09-29 DIAGNOSIS — Z4681 Encounter for fitting and adjustment of insulin pump: Secondary | ICD-10-CM | POA: Diagnosis not present

## 2021-09-29 DIAGNOSIS — E1129 Type 2 diabetes mellitus with other diabetic kidney complication: Secondary | ICD-10-CM | POA: Diagnosis not present

## 2021-09-29 DIAGNOSIS — Z794 Long term (current) use of insulin: Secondary | ICD-10-CM | POA: Diagnosis not present

## 2021-10-01 ENCOUNTER — Encounter (HOSPITAL_BASED_OUTPATIENT_CLINIC_OR_DEPARTMENT_OTHER): Payer: Medicare Other | Attending: Internal Medicine | Admitting: Internal Medicine

## 2021-10-01 DIAGNOSIS — I2510 Atherosclerotic heart disease of native coronary artery without angina pectoris: Secondary | ICD-10-CM | POA: Insufficient documentation

## 2021-10-01 DIAGNOSIS — E039 Hypothyroidism, unspecified: Secondary | ICD-10-CM | POA: Diagnosis not present

## 2021-10-01 DIAGNOSIS — E1142 Type 2 diabetes mellitus with diabetic polyneuropathy: Secondary | ICD-10-CM | POA: Diagnosis not present

## 2021-10-01 DIAGNOSIS — E11621 Type 2 diabetes mellitus with foot ulcer: Secondary | ICD-10-CM | POA: Insufficient documentation

## 2021-10-01 DIAGNOSIS — E1151 Type 2 diabetes mellitus with diabetic peripheral angiopathy without gangrene: Secondary | ICD-10-CM | POA: Diagnosis not present

## 2021-10-01 DIAGNOSIS — L97512 Non-pressure 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: Secondary | ICD-10-CM | POA: Diagnosis not present

## 2021-10-01 DIAGNOSIS — I10 Essential (primary) hypertension: Secondary | ICD-10-CM | POA: Insufficient documentation

## 2021-10-01 NOTE — Progress Notes (Addendum)
SHANITRA, PHILLIPPI ([id number]) Visit Report for 10/01/2021 Allergy List Details Patient Name: Date of Service: [name], [name] 10/01/2021 8:00 [name] M Medical Record Number: [id number] Patient Account Number: [id number] Date of Birth/Sex: Treating RN: 02-25-1955 (66 y.o. [name], [name] Primary Care Ryana Montecalvo: [name] Other Clinician: Referring Dontrae Morini: Treating Syliva Mee/Extender: [name] in Treatment: 0 Electronic Signature(s) Signed: 10/01/2021 7:18:59 PM By: Kalman Shan DO Previous Signature: 10/01/2021 2:44:56 PM Version By: Valeria Batman EMT Previous Signature: 10/01/2021 12:27:41 PM Version By: Rhae Hammock RN Entered By: Kalman Shan on 10/01/2021 19:18:59 -------------------------------------------------------------------------------- Arrival Information Details Patient Name: Date of Service: [name], [name] 10/01/2021 8:00 [name] M Medical Record Number: [id number] Patient Account Number: [id number] Date of Birth/Sex: Treating RN: October 30, 1955 (66 y.o. [name], [name] Primary Care Jamillia Closson: [name] Other Clinician: Referring Meral Geissinger: Treating Jakala Herford/Extender: [name] in Treatment: 0 Visit Information Patient Arrived: Ambulatory Arrival Time: 08:11 Accompanied By: self Transfer Assistance: None Patient Identification Verified: Yes Secondary Verification Process Completed: Yes Patient Requires Transmission-Based Precautions: No Patient Has Alerts: No History Since Last Visit Added or deleted any medications: No Any new allergies or adverse reactions: No Had [name] fall or experienced change in activities of daily living that may affect risk of falls: No Signs or symptoms of abuse/neglect since last visito No Hospitalized since last visit: No Implantable device outside of the clinic excluding cellular tissue based products placed in the center since last  visit: No Has Dressing in Place as Prescribed: Yes Electronic Signature(s) Signed: 10/01/2021 12:27:41 PM By: Rhae Hammock RN Entered By: Rhae Hammock on 10/01/2021 08:12:35 -------------------------------------------------------------------------------- Clinic Level of Care Assessment Details Patient Name: Date of Service: [name], [name] 10/01/2021 8:00 [name] M Medical Record Number: [id number] Patient Account Number: [id number] Date of Birth/Sex: Treating RN: 07-Sep-1955 (66 y.o. [name], [name] Primary Care Kiaan Overholser: [name] Other Clinician: Referring Tamiyah Moulin: Treating Lavaya Defreitas/Extender: [name] in Treatment: 0 Clinic Level of Care Assessment Items TOOL 4 Quantity Score X- 1 0 Use when only an EandM is performed on FOLLOW-UP visit ASSESSMENTS - Nursing Assessment / Reassessment X- 1 10 Reassessment of Co-morbidities (includes updates in patient status) X- 1 5 Reassessment of Adherence to Treatment Plan ASSESSMENTS - Wound and Skin [name] ssessment / Reassessment X - Simple Wound Assessment / Reassessment - one wound 1 5 '[]'$  - 0 Complex Wound Assessment / Reassessment - multiple wounds '[]'$  - 0 Dermatologic / Skin Assessment (not related to wound area) ASSESSMENTS - Focused Assessment X- 1 5 Circumferential Edema Measurements - multi extremities '[]'$  - 0 Nutritional Assessment / Counseling / Intervention '[]'$  - 0 Lower Extremity Assessment (monofilament, tuning fork, pulses) '[]'$  - 0 Peripheral Arterial Disease Assessment (using hand held doppler) ASSESSMENTS - Ostomy and/or Continence Assessment and Care '[]'$  - 0 Incontinence Assessment and Management '[]'$  - 0 Ostomy Care Assessment and Management (repouching, etc.) PROCESS - Coordination of Care X - Simple Patient / Family Education for ongoing care 1 15 '[]'$  - 0 Complex (extensive) Patient / Family Education for ongoing care X- 1 10 Staff obtains Programmer, systems, Records, T  Results / Process Orders est '[]'$  - 0 Staff telephones HHA, Nursing Homes / Clarify orders / etc '[]'$  - 0 Routine Transfer to another Facility (non-emergent condition) '[]'$  - 0 Routine Hospital Admission (non-emergent condition) X- 1 15 New Admissions / Biomedical engineer / Ordering NPWT Apligraf, etc. , '[]'$  -  [facility] Admission (emergent condition) X- 1 10 Simple Discharge Coordination '[]'$  - 0 Complex (extensive) Discharge Coordination PROCESS - Special Needs '[]'$  - 0 Pediatric / Minor Patient Management '[]'$  - 0 Isolation Patient Management '[]'$  - 0 Hearing / Language / Visual special needs '[]'$  - 0 Assessment of Community assistance (transportation, D/C planning, etc.) '[]'$  - 0 Additional assistance / Altered mentation '[]'$  - 0 Support Surface(s) Assessment (bed, cushion, seat, etc.) INTERVENTIONS - Wound Cleansing / Measurement X - Simple Wound Cleansing - one wound 1 5 '[]'$  - 0 Complex Wound Cleansing - multiple wounds X- 1 5 Wound Imaging (photographs - any number of wounds) '[]'$  - 0 Wound Tracing (instead of photographs) X- 1 5 Simple Wound Measurement - one wound '[]'$  - 0 Complex Wound Measurement - multiple wounds INTERVENTIONS - Wound Dressings X- 1 10 Small Wound Dressing one or multiple wounds '[]'$  - 0 Medium Wound Dressing one or multiple wounds '[]'$  - 0 Large Wound Dressing one or multiple wounds '[]'$  - 0 Application of Medications - topical '[]'$  - 0 Application of Medications - injection INTERVENTIONS - Miscellaneous '[]'$  - 0 External ear exam '[]'$  - 0 Specimen Collection (cultures, biopsies, blood, body fluids, etc.) '[]'$  - 0 Specimen(s) / Culture(s) sent or taken to Lab for analysis '[]'$  - 0 Patient Transfer (multiple staff / Civil Service fast streamer / Similar devices) '[]'$  - 0 Simple Staple / Suture removal (25 or less) '[]'$  - 0 Complex Staple / Suture removal (26 or more) '[]'$  - 0 Hypo / Hyperglycemic Management (close monitor of Blood Glucose) '[]'$  - 0 Ankle / Brachial  Index (ABI) - do not check if billed separately X- 1 5 Vital Signs Has the patient been seen at the hospital within the last three years: Yes Total Score: 105 Level Of Care: New/Established - Level 3 Electronic Signature(s) Signed: 10/01/2021 12:27:41 PM By: Rhae Hammock RN Entered By: Rhae Hammock on 10/01/2021 09:49:58 -------------------------------------------------------------------------------- Encounter Discharge Information Details Patient Name: Date of Service: [name], [name] 10/01/2021 8:00 [name] M Medical Record Number: [id number] Patient Account Number: [id number] Date of Birth/Sex: Treating RN: [birth year] (66 y.o. [name], [name] Primary Care Oree Mirelez: [name] Other Clinician: Referring Nysia Dell: Treating Kimoni Pagliarulo/Extender: [name] in Treatment: 0 Encounter Discharge Information Items Discharge Condition: Stable Ambulatory Status: Ambulatory Discharge Destination: Home Transportation: Private Auto Accompanied By: self Schedule Follow-up Appointment: Yes Clinical Summary of Care: Patient Declined Electronic Signature(s) Signed: 10/01/2021 12:27:41 PM By: Rhae Hammock RN Entered By: Rhae Hammock on 10/01/2021 09:50:30 -------------------------------------------------------------------------------- Lower Extremity Assessment Details Patient Name: Date of Service: [name], [name] 10/01/2021 8:00 [name] M Medical Record Number: [id number] Patient Account Number: [id number] Date of Birth/Sex: Treating RN: [birth year] (66 y.o. [name], [name] Primary Care Riana Tessmer: [name] Other Clinician: Referring Burnell Matlin: Treating Delvecchio Madole/Extender: Glenna Fellows Weeks in Treatment: 0 Edema Assessment Assessed: [name]: No] [Right: Yes] Edema: [Left: Ye] [Right: s] Vascular Assessment Pulses: Dorsalis Pedis Palpable: [Right:Yes] Posterior Tibial Palpable:  [Right:Yes] Electronic Signature(s) Signed: 10/01/2021 12:27:41 PM By: Rhae Hammock RN Entered By: Rhae Hammock on 10/01/2021 08:16:36 -------------------------------------------------------------------------------- Multi Wound Chart Details Patient Name: Date of Service: [name], [name] 10/01/2021 8:00 [name] M Medical Record Number: [id number] Patient Account Number: [id number] Date of Birth/Sex: Treating RN: 14-Mar-1955 (66 y.o. [name], [name] Primary Care Jsiah Menta: [name] Other Clinician: Referring Winifred Balogh: Treating Traivon Morrical/Extender: [name] in Treatment: 0 Vital Signs Height(in):  Pulse(bpm): 89 Weight(lbs): Blood Pressure(mmHg): 136/70 Body Mass Index(BMI): Temperature(F): 98.7 Respiratory Rate(breaths/min): 17 Photos: [N/[name]:N/[name]] Right, Plantar Metatarsal head first N/[name] N/[name] Wound Location: Gradually Appeared N/[name] N/[name] Wounding Event: Diabetic Wound/Ulcer of the Lower N/[name] N/[name] Primary Etiology: Extremity Sleep Apnea, Coronary Artery N/[name] N/[name] Comorbid History: Disease, Hypertension, Type II Diabetes, Neuropathy 07/22/2021 N/[name] N/[name] Date Acquired: 0 N/[name] N/[name] Weeks of Treatment: Open N/[name] N/[name] Wound Status: No N/[name] N/[name] Wound Recurrence: Yes N/[name] N/[name] Pending [name] mputation on Presentation: 0.3x0.3x2.2 N/[name] N/[name] Measurements L x W x D (cm) 0.071 N/[name] N/[name] [name] (cm) : rea 0.156 N/[name] N/[name] Volume (cm) : 0.00% N/[name] N/[name] % Reduction in Area: 0.00% N/[name] N/[name] % Reduction in Volume: 12 Starting Position 1 (o'clock): 12 Ending Position 1 (o'clock): 2.5 Maximum Distance 1 (cm): Yes N/[name] N/[name] Undermining: Grade 2 N/[name] N/[name] Classification: Large N/[name] N/[name] Exudate [name] mount: Purulent N/[name] N/[name] Exudate Type: yellow, brown, green N/[name] N/[name] Exudate Color: Distinct, outline attached N/[name] N/[name] Wound Margin: Medium (34-66%) N/[name] N/[name] Granulation [name] mount: Red, Pink N/[name] N/[name] Granulation Quality: Medium (34-66%) N/[name] N/[name] Necrotic [name]  mount: Fat Layer (Subcutaneous Tissue): Yes N/[name] N/[name] Exposed Structures: Fascia: No Tendon: No Muscle: No Joint: No Bone: No None N/[name] N/[name] Epithelialization: Treatment Notes Wound #4 (Metatarsal head first) Wound Laterality: Plantar, Right Cleanser Soap and Water Discharge Instruction: May shower and wash wound with dial antibacterial soap and water prior to dressing change. Wound Cleanser Discharge Instruction: Cleanse the wound with wound cleanser prior to applying [name] clean dressing using gauze sponges, not tissue or cotton balls. Peri-Wound Care Topical Primary Dressing Dakin's Solution 0.25%, 16 (oz) Discharge Instruction: Moisten gauze with Dakin's solution Secondary Dressing ABD Pad, 5x9 Discharge Instruction: Apply over primary dressing as directed. Optifoam Non-Adhesive Dressing, 4x4 in Discharge Instruction: Apply [name] foam donut over primary dressing as directed. Woven Gauze Sponge, Non-Sterile 4x4 in Discharge Instruction: Apply over primary dressing as directed. Secured With Elastic Bandage 4 inch (ACE bandage) Discharge Instruction: Secure with ACE bandage as directed. Kerlix Roll Sterile, 4.5x3.1 (in/yd) Discharge Instruction: Secure with Kerlix as directed. 68M Medipore H Soft Cloth Surgical T ape, 4 x 10 (in/yd) Discharge Instruction: Secure with tape as directed. Compression Wrap Compression Stockings Add-Ons Electronic Signature(s) Signed: 10/01/2021 12:27:41 PM By: Rhae Hammock RN Signed: 10/01/2021 1:41:40 PM By: Kalman Shan DO Entered By: Kalman Shan on 10/01/2021 09:59:25 -------------------------------------------------------------------------------- Multi-Disciplinary Care Plan Details Patient Name: Date of Service: [name], [name] 10/01/2021 8:00 [name] M Medical Record Number: [id number] Patient Account Number: [id number] Date of Birth/Sex: Treating RN: 1955-11-24 (66 y.o. [name], [name] Primary Care Kruti Horacek: [name] Other Clinician: Referring Genia Perin: Treating Malana Eberwein/Extender: [name] in Treatment: 0 Active Inactive Orientation to the Wound Care Program Nursing Diagnoses: Knowledge deficit related to the wound healing center program Goals: Patient/caregiver will verbalize understanding of the [place] Program Date Initiated: 10/01/2021 Target Resolution Date: 10/28/2021 Goal Status: Active Interventions: Provide education on orientation to the wound center Notes: Wound/Skin Impairment Nursing Diagnoses: Impaired tissue integrity Knowledge deficit related to ulceration/compromised skin integrity Goals: Patient will have [name] decrease in wound volume by X% from date: (specify in notes) Date Initiated: 10/01/2021 Target Resolution Date: 10/29/2021 Goal Status: Active Patient/caregiver will verbalize understanding of skin care regimen Date Initiated: 10/01/2021 Target Resolution Date: 10/25/2021 Goal Status: Active Ulcer/skin breakdown will have [name] volume reduction of 30% by week 4 Date Initiated: 10/01/2021 Target Resolution Date: 10/28/2021 Goal Status: Active Interventions: Assess  patient/caregiver ability to obtain necessary supplies Assess patient/caregiver ability to perform ulcer/skin care regimen upon admission and as needed Assess ulceration(s) every visit Notes: Electronic Signature(s) Signed: 10/01/2021 12:27:41 PM By: Rhae Hammock RN Entered By: Rhae Hammock on 10/01/2021 08:33:56 -------------------------------------------------------------------------------- Pain Assessment Details Patient Name: Date of Service: [name], [name] 10/01/2021 8:00 [name] M Medical Record Number: [id number] Patient Account Number: [id number] Date of Birth/Sex: Treating RN: [birth year] (66 y.o. [name], [name] Primary Care Traeger Sultana: [name] Other Clinician: Referring Kenndra Morris: Treating Michi Herrmann/Extender: [name] in Treatment: 0 Active Problems Location of Pain Severity and Description of Pain Patient Has Paino No Site Locations Pain Management and Medication Current Pain Management: Electronic Signature(s) Signed: 10/01/2021 12:27:41 PM By: Rhae Hammock RN Entered By: Rhae Hammock on 10/01/2021 08:16:45 -------------------------------------------------------------------------------- Patient/Caregiver Education Details Patient Name: Date of Service: [name], [name] 8/11/2023andnbsp8:00 [name] M Medical Record Number: [id number] Patient Account Number: [id number] Date of Birth/Gender: Treating RN: June 11, 1955 (66 y.o. [name], [name] Primary Care Physician: [name] Other Clinician: Referring Physician: Treating Physician/Extender: [name] in Treatment: 0 Education Assessment Education Provided To: Patient Education Topics Provided Welcome T [facility]: o Methods: Explain/Verbal Responses: Reinforcements needed, State content correctly Electronic Signature(s) Signed: 10/01/2021 12:27:41 PM By: Rhae Hammock RN Entered By: Rhae Hammock on 10/01/2021 08:34:13 -------------------------------------------------------------------------------- Wound Assessment Details Patient Name: Date of Service: [name], [name] 10/01/2021 8:00 [name] M Medical Record Number: [id number] Patient Account Number: [id number] Date of Birth/Sex: Treating RN: August 18, 1955 (66 y.o. [name], [name] Primary Care Lynita Groseclose: [name] Other Clinician: Referring Dale Strausser: Treating Aisley Whan/Extender: [name] in Treatment: 0 Wound Status Wound Number: 4 Primary Diabetic Wound/Ulcer of the Lower Extremity Etiology: Wound Location: Right, Plantar Metatarsal head first Wound Open Wounding Event: Gradually Appeared Status: Date Acquired:  07/22/2021 Comorbid Sleep Apnea, Coronary Artery Disease, Hypertension, Type II Weeks Of Treatment: 0 History: Diabetes, Neuropathy Clustered Wound: No Photos Wound Measurements Length: (cm) 0.3 Width: (cm) 0.3 Depth: (cm) 2.2 Area: (cm) 0.071 Volume: (cm) 0.156 % Reduction in Area: 0% % Reduction in Volume: 0% Epithelialization: None Tunneling: No Undermining: Yes Starting Position (o'clock): 12 Ending Position (o'clock): 12 Maximum Distance: (cm) 2.5 Wound Description Classification: Grade 2 Wound Margin: Distinct, outline attached Exudate Amount: Large Exudate Type: Purulent Exudate Color: yellow, brown, green Foul Odor After Cleansing: No Slough/Fibrino Yes Wound Bed Granulation Amount: Medium (34-66%) Exposed Structure Granulation Quality: Red, Pink Fascia Exposed: No Necrotic Amount: Medium (34-66%) Fat Layer (Subcutaneous Tissue) Exposed: Yes Necrotic Quality: Adherent Slough Tendon Exposed: No Muscle Exposed: No Joint Exposed: No Bone Exposed: No Electronic Signature(s) Signed: 10/01/2021 12:27:41 PM By: Rhae Hammock RN Signed: 10/01/2021 5:06:10 PM By: Deon Pilling RN, BSN Entered By: Deon Pilling on 10/01/2021 08:26:13 -------------------------------------------------------------------------------- Vitals Details Patient Name: Date of Service: [name], [name] 10/01/2021 8:00 [name] M Medical Record Number: [id number] Patient Account Number: [id number] Date of Birth/Sex: Treating RN: [birth year] (66 y.o. [name], [name] Primary Care Shamara Soza: [name] Other Clinician: Referring Glendale Youngblood: Treating Kripa Foskey/Extender: [name] in Treatment: 0 Vital Signs Time Taken: 08:13 Temperature (F): 98.7 Pulse (bpm): 89 Respiratory Rate (breaths/min): 17 Blood Pressure (mmHg): 136/70 Reference Range: 80 - 120 mg / dl Electronic Signature(s) Signed: 10/01/2021 12:27:41 PM By: Rhae Hammock  RN Entered By: Rhae Hammock on 10/01/2021 08:15:12

## 2021-10-01 NOTE — Progress Notes (Addendum)
ALLESSANDRA, [name] ([id number]) Visit Report for 10/01/2021 Chief Complaint Document Details Patient Name: Date of Service: [name], [name] 10/01/2021 8:00 A M Medical Record Number: [id number] Patient Account Number: [id number] Date of Birth/Sex: Treating RN: [birth year] (66 y.o. [name], [name] Primary Care Provider: [name] Other Clinician: Referring Provider: Treating Provider/Extender: [name] in Treatment: 0 Information Obtained from: Patient Chief Complaint 10/27; Right Plantar foot wound 11/21; 2 small areas of skin breakdown to the right foot following cast placement Electronic Signature(s) Signed: 10/01/2021 1:41:40 PM By: Kalman Shan DO Entered By: Kalman Shan on 10/01/2021 09:59:34 -------------------------------------------------------------------------------- Debridement Details Patient Name: Date of Service: [name], [name] 10/01/2021 8:00 A M Medical Record Number: [id number] Patient Account Number: [id number] Date of Birth/Sex: Treating RN: [birth year] (66 y.o. [name], [name] Primary Care Provider: [name] Other Clinician: Referring Provider: Treating Provider/Extender: [name] in Treatment: 0 Debridement Performed for Assessment: Wound #4 Right,Plantar Metatarsal head first Performed By: Clinician Rhae Hammock, RN Debridement Type: Chemical/Enzymatic/Mechanical Agent Used: gauze and wound cleanser Severity of Tissue Pre Debridement: Fat layer exposed Level of Consciousness (Pre-procedure): Awake and Alert Pre-procedure Verification/Time Out Yes - 08:30 Taken: Start Time: 08:30 Pain Control: Lidocaine Instrument: Other : gauze and NS Bleeding: Minimum Hemostasis Achieved: Pressure End Time: 08:30 Procedural Pain: 0 Post Procedural Pain: 0 Response to Treatment: Procedure was tolerated well Level of Consciousness (Post- Awake  and Alert procedure): Post Debridement Measurements of Total Wound Length: (cm) 0.3 Width: (cm) 0.3 Depth: (cm) 2.2 Volume: (cm) 0.156 Character of Wound/Ulcer Post Debridement: Improved Severity of Tissue Post Debridement: Fat layer exposed Post Procedure Diagnosis Same as Pre-procedure Electronic Signature(s) Signed: 10/04/2021 12:18:02 PM By: Kalman Shan DO Signed: 10/04/2021 4:24:40 PM By: Rhae Hammock RN Entered By: Rhae Hammock on 10/04/2021 11:34:36 -------------------------------------------------------------------------------- HPI Details Patient Name: Date of Service: [name], [name] 10/01/2021 8:00 A M Medical Record Number: [id number] Patient Account Number: [id number] Date of Birth/Sex: Treating RN: Mar 28, 1955 (66 y.o. [name], [name] Primary Care Provider: [name] Other Clinician: Referring Provider: Treating Provider/Extender: [name] in Treatment: 0 History of Present Illness HPI Description: Admission 12/17/2020 [name] a 66 year old female with a past medical history of insulin-dependent type 2 diabetes, hypothyroidism and daily1 pack per day cigarette smoker the presents to the clinic for a 6-week history of nonhealing wound to the right first MTPJ. She has been following with [name], podiatry for this issue. She has been using silver alginate with dressing changes. She uses a postsurgical shoe and offloading pads. She currently denies signs of infection. 10/31; patient presents for follow-up. She tolerated the soft cast fine although she states that she felt her foot rolling to one side. She denies signs of infection. She would like to do the total contact cast today. 12/23/2020 upon evaluation today patient appears to be doing excellent in regard to her wound on the foot and she is in a total contact cast. I do think this is appropriate this is the first cast change which  we are obliged to do to ensure nothing is rubbing everything appears to be doing quite well and very pleased in that regard. 11/7; patient presents for follow-up. She had no issues with the cast. She denies signs of infection. 11/14; patient presents for 1 week follow-up. She has had no issues with the cast. She denies signs of infection.  11/21; patient presents for 1 week follow-up. She did develop 2 small areas of skin breakdown on either side of the right foot from the cast rubbing. She states she did not feel the wounds developing. She currently denies signs of infection. 11/28; patient presents for 1 week follow-up. She has no issues or complaints today. She denies pain or acute signs of infection. 12/12; patient presents for follow-up. She reports improvement to her right lateral foot wound. She has been using silver alginate to the area. She has no issues or complaints today. 12/19; patient presents for follow-up. She reports that her right lateral foot wound has healed. She has no issues or complaints today. 1/3; patient presents for follow-up. She has no issues or complaints today. She reports no open wounds. Readmission 10/01/2021 [name] is a 66 year old female's with a past medical history of insulin-dependent controlled type 2 diabetes complicated by peripheral neuropathy that presents to the clinic for a 34-monthhistory of nonhealing ulcer to the right foot. I have seen her before for a wound to the same area that was treated and healed. Her wound today however this is much deeper and it has thick yellow drainage. She is not sure exactly how it started. She noticed it 1 day. She has been following with Dr. EAmalia Haileyfor this issue. She had a wound culture that showed a mix of organisms on 09/01/2021. She was recently started on doxycycline. She is using the dConservation officer, nature She has been using Iodosorb with dressing changes. She currently denies systemic signs of  infection. Electronic Signature(s) Signed: 10/01/2021 1:41:40 PM By: HKalman ShanDO Entered By: HKalman Shanon 10/01/2021 10:20:33 -------------------------------------------------------------------------------- Physical Exam Details Patient Name: Date of Service: [name], [name] 10/01/2021 8:00 A M Medical Record Number: [id number] Account Number: [id number] of Birth/Sex: Treating RN: 1December 21, 1957((66y.o. FTonita [name] [name] Primary Care Provider: AGeoffery LyonsOther Clinician: Referring Provider: Treating Provider/Extender: HBaird Kayin Treatment: 0 Constitutional respirations regular, non-labored and within target range for patient.. Cardiovascular 2+ dorsalis pedis/posterior tibialis pulses. Psychiatric pleasant and cooperative. Notes Right foot: T the first submetatarsal there is an open wound with increased in depth and scant thick yellow drainage. Granulation tissue at the opening. No o probing to bone. No increased warmth, erythema noted. Electronic Signature(s) Signed: 10/01/2021 1:41:40 PM By: HKalman ShanDO Entered By: HKalman Shanon 10/01/2021 10:15:38 -------------------------------------------------------------------------------- Physician Orders Details Patient Name: Date of Service: [name], [name] 10/01/2021 8:00 A M Medical Record Number: [id number] Account Number: [id number] of Birth/Sex: Treating RN: 103-15-1957((66y.o. FTonita [name] [name] Primary Care Provider: AGeoffery LyonsOther Clinician: Referring Provider: Treating Provider/Extender: HBaird Kayin Treatment: 0 Verbal / Phone Orders: No Diagnosis Coding Follow-up Appointments ppointment in 1 week. - 10/11/21 @ 1345 w/ Dr. HHeber Carolinaand LElias Else# 9 Return A ****Pick up your antibiotics from your pharmacy today**** Bathing/ Shower/ Hygiene May shower with protection  but do not get wound dressing(s) wet. Edema Control - Lymphedema / SCD / Other Elevate legs to the level of the heart or above for 30 minutes daily and/or when sitting, a frequency of: Avoid standing for long periods of time. Off-Loading Open toe surgical shoe to: - front offloader to right foot Wound Treatment Wound #4 - Metatarsal head first Wound Laterality: Plantar, Right Cleanser: Soap and Water 2 x Per Day/15 Days Discharge Instructions: May shower and wash  wound with dial antibacterial soap and water prior to dressing change. Cleanser: Wound Cleanser (DME) (Generic) 2 x Per Day/15 Days Discharge Instructions: Cleanse the wound with wound cleanser prior to applying a clean dressing using gauze sponges, not tissue or cotton balls. Prim Dressing: Dakin's Solution 0.25%, 16 (oz) (DME) (Generic) 2 x Per Day/15 Days ary Discharge Instructions: Moisten gauze with Dakin's solution Prim Dressing: Basic Conforming Stretch Gauze, Sterile, 2 (in) roll (DME) (Generic) 2 x Per Day/15 Days ary Discharge Instructions: Soak in Dakin's and pack into wound Secondary Dressing: Optifoam Non-Adhesive Dressing, 4x4 in (DME) (Generic) 2 x Per Day/15 Days Discharge Instructions: Apply a foam donut over primary dressing as directed. Secondary Dressing: Woven Gauze Sponge, Non-Sterile 4x4 in (DME) (Generic) 2 x Per Day/15 Days Discharge Instructions: Apply over primary dressing as directed. Secured With: [employer], 4.5x3.1 (in/yd) (DME) (Generic) 2 x Per Day/15 Days Discharge Instructions: Secure with Kerlix as directed. Secured With: 67M Medipore H Soft Cloth Surgical T ape, 4 x 10 (in/yd) (DME) (Generic) 2 x Per Day/15 Days Discharge Instructions: Secure with tape as directed. Secured With: [employer] Size 5, 10 (yds) (DME) (Generic) 2 x Per Day/15 Days Patient Medications llergies: Documentation for allergies has not been submitted A Notifications Medication Indication Start  End 10/01/2021 amoxicillin-pot clavulanate DOSE 1 - oral 875 mg-125 mg tablet - 1 tablet oral BID x 14 days 10/01/2021 doxycycline hyclate DOSE 1 - oral 100 mg tablet - 1 tablet oral BID x 14 days 10/01/2021 Dakin's Solution DOSE 1 - miscellaneous 0.25 % solution - moisten gauze for wet to dry dressing 10/01/2021 levofloxacin DOSE 1 - oral 500 mg tablet - 1 tablet oral daily x 10 days Electronic Signature(s) Signed: 10/01/2021 7:24:00 PM By: Kalman Shan DO Previous Signature: 10/01/2021 1:41:40 PM Version By: Kalman Shan DO Previous Signature: 10/01/2021 9:18:26 AM Version By: Kalman Shan DO Entered By: Kalman Shan on 10/01/2021 19:23:59 -------------------------------------------------------------------------------- Problem List Details Patient Name: Date of Service: [name], [name] 10/01/2021 8:00 A M Medical Record Number: [id number] Patient Account Number: [id number] Date of Birth/Sex: Treating RN: Sep 15, 1955 (66 y.o. [name], [name] Primary Care Provider: [name] Other Clinician: Referring Provider: Treating Provider/Extender: [name] in Treatment: 0 Active Problems ICD-10 Encounter Code Description Active Date MDM Diagnosis L97.512 Non-pressure 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 10/01/2021 No Yes E11.621 Type 2 diabetes mellitus with foot ulcer 10/01/2021 No Yes E11.42 Type 2 diabetes mellitus with diabetic polyneuropathy 10/01/2021 No Yes Inactive Problems Resolved Problems Electronic Signature(s) Signed: 10/01/2021 1:41:40 PM By: Kalman Shan DO Entered By: Kalman Shan on 10/01/2021 09:59:16 -------------------------------------------------------------------------------- Progress Note Details Patient Name: Date of Service: [name], [name] 10/01/2021 8:00 A M Medical Record Number: [id number] Patient Account Number: [id number] Date of Birth/Sex:  Treating RN: 11-Jun-1955 (66 y.o. [name], [name] Primary Care Provider: [name] Other Clinician: Referring Provider: Treating Provider/Extender: [name] in Treatment: 0 Subjective Chief Complaint Information obtained from Patient 10/27; Right Plantar foot wound 11/21; 2 small areas of skin breakdown to the right foot following cast placement History of Present Illness (HPI) Admission 12/17/2020 [name] a 66 year old female with a past medical history of insulin-dependent type 2 diabetes, hypothyroidism and daily1 pack per day cigarette smoker the presents to the clinic for a 6-week history of nonhealing wound to the right first MTPJ. She has been following with Dr.  Evans, podiatry for this issue. She has been using silver alginate with dressing changes. She uses a postsurgical shoe and offloading pads. She currently denies signs of infection. 10/31; patient presents for follow-up. She tolerated the soft cast fine although she states that she felt her foot rolling to one side. She denies signs of infection. She would like to do the total contact cast today. 12/23/2020 upon evaluation today patient appears to be doing excellent in regard to her wound on the foot and she is in a total contact cast. I do think this is appropriate this is the first cast change which we are obliged to do to ensure nothing is rubbing everything appears to be doing quite well and very pleased in that regard. 11/7; patient presents for follow-up. She had no issues with the cast. She denies signs of infection. 11/14; patient presents for 1 week follow-up. She has had no issues with the cast. She denies signs of infection. 11/21; patient presents for 1 week follow-up. She did develop 2 small areas of skin breakdown on either side of the right foot from the cast rubbing. She states she did not feel the wounds developing. She currently denies signs of  infection. 11/28; patient presents for 1 week follow-up. She has no issues or complaints today. She denies pain or acute signs of infection. 12/12; patient presents for follow-up. She reports improvement to her right lateral foot wound. She has been using silver alginate to the area. She has no issues or complaints today. 12/19; patient presents for follow-up. She reports that her right lateral foot wound has healed. She has no issues or complaints today. 1/3; patient presents for follow-up. She has no issues or complaints today. She reports no open wounds. Readmission 10/01/2021 [name] a 66 year old female's with a past medical history of insulin-dependent controlled type 2 diabetes complicated by peripheral neuropathy that presents to the clinic for a 46-monthhistory of nonhealing ulcer to the right foot. I have seen her before for a wound to the same area that was treated and healed. Her wound today however this is much deeper and it has thick yellow drainage. She is not sure exactly how it started. She noticed it 1 day. She has been following with Dr. EAmalia Haileyfor this issue. She had a wound culture that showed a mix of organisms on 09/01/2021. She was recently started on doxycycline. She is using the dConservation officer, nature She has been using Iodosorb with dressing changes. She currently denies systemic signs of infection. Patient History Information obtained from Patient, Chart. Allergies No Known Allergies Family History Unknown History. Social History Current every day smoker - 1 pack/a/day, Alcohol Use - Never, Drug Use - No History, Caffeine Use - Moderate. Medical History Eyes Denies history of Cataracts, Glaucoma, Optic Neuritis Ear/Nose/Mouth/Throat Denies history of Chronic sinus problems/congestion, Middle ear problems Hematologic/Lymphatic Denies history of Anemia, Hemophilia, Human Immunodeficiency Virus, Lymphedema, Sickle Cell Disease Respiratory Patient has history of  Sleep Apnea Denies history of Aspiration, Asthma, Chronic Obstructive Pulmonary Disease (COPD), Pneumothorax, Tuberculosis Cardiovascular Patient has history of Coronary Artery Disease, Hypertension Denies history of Angina, Arrhythmia, Congestive Heart Failure, Deep Vein Thrombosis, Hypotension, Myocardial Infarction, Peripheral Arterial Disease, Peripheral Venous Disease, Phlebitis, Vasculitis Gastrointestinal Denies history of Cirrhosis , Colitis, Crohnoos, Hepatitis A, Hepatitis B, Hepatitis C Endocrine Patient has history of Type II Diabetes Denies history of Type I Diabetes Genitourinary Denies history of End Stage Renal Disease Immunological Denies history of Lupus Erythematosus, Raynaudoos, Scleroderma Integumentary (Skin)  Denies history of History of Burn Musculoskeletal Denies history of Gout, Rheumatoid Arthritis, Osteoarthritis, Osteomyelitis Neurologic Patient has history of Neuropathy Denies history of Dementia, Quadriplegia, Paraplegia, Seizure Disorder Hospitalization/Surgery History - 2nd and 3rd right toe amps. - total hysterectomy. - cholecystectomy. - 2 knee surgery's on left and 1 on right. Medical A Surgical History Notes nd Cardiovascular hypercholesterolemia Objective Constitutional respirations regular, non-labored and within target range for patient.. Vitals Time Taken: 8:13 AM, Temperature: 98.7 F, Pulse: 89 bpm, Respiratory Rate: 17 breaths/min, Blood Pressure: 136/70 mmHg. Cardiovascular 2+ dorsalis pedis/posterior tibialis pulses. Psychiatric pleasant and cooperative. General Notes: Right foot: T the first submetatarsal there is an open wound with increased in depth and scant thick yellow drainage. Granulation tissue at the o opening. No probing to bone. No increased warmth, erythema noted. Integumentary (Hair, Skin) Wound #4 status is Open. Original cause of wound was Gradually Appeared. The date acquired was: 07/22/2021. The wound is located on  the Right,Plantar Metatarsal head first. The wound measures 0.3cm length x 0.3cm width x 2.2cm depth; 0.071cm^2 area and 0.156cm^3 volume. There is Fat Layer (Subcutaneous Tissue) exposed. There is no tunneling noted, however, there is undermining starting at 12:00 and ending at 12:00 with a maximum distance of 2.5cm. There is a large amount of purulent drainage noted. The wound margin is distinct with the outline attached to the wound base. There is medium (34-66%) red, pink granulation within the wound bed. There is a medium (34-66%) amount of necrotic tissue within the wound bed including Adherent Slough. Assessment Active Problems ICD-10 Non-pressure 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 Type 2 diabetes mellitus with foot ulcer Type 2 diabetes mellitus with diabetic polyneuropathy Patient presents with nonhealing ulcer to the right first submetatarsal for the past couple months. There was thick yellow drainage on exam. Lukily this does not probe to bone. She had an MRI on 09/07/2021 that did not show evidence of osteomyelitis. It did note that there was a small amount of fluid superficial to the first MTP joint which may reflect a small abscess. She is currently on doxycycline and I recommended adding Augmentin to this. She denies penicillin allergy. I also recommended Dakin's wet-to-dry dressings. We will give her a front offloading shoe. Follow up in one month. Addendum: Pharmacy canceled Augmentin due to patient report of penicillin allergy. Patient did tell me that she has taken Augmentin and amoxicillin in the past without any issues. Despite this I will change her antibiotics to Levofloxacin and Doxycycline. Patient was contacted over the weekend multiple times however did not answer. A message was left on her personal voicemail to pick up her new antibiotic and to start taking this with doxycycline. No augmentin. Plan Follow-up Appointments: Return Appointment  in 1 week. - 10/11/21 @ 1345 w/ Dr. Heber  and Elias Else # 9 ****Pick up your antibiotics from your pharmacy today**** Bathing/ Shower/ Hygiene: May shower with protection but do not get wound dressing(s) wet. Edema Control - Lymphedema / SCD / Other: Elevate legs to the level of the heart or above for 30 minutes daily and/or when sitting, a frequency of: Avoid standing for long periods of time. Off-Loading: Open toe surgical shoe to: - front offloader to right foot The following medication(s) was prescribed: amoxicillin-pot clavulanate oral 875 mg-125 mg tablet 1 1 tablet oral BID x 14 days starting 10/01/2021 doxycycline hyclate oral 100 mg tablet 1 1 tablet oral BID x 14 days starting 10/01/2021 Dakin's Solution miscellaneous 0.25 %  solution 1 moisten gauze for wet to dry dressing starting 10/01/2021 WOUND #4: - Metatarsal head first Wound Laterality: Plantar, Right Cleanser: Soap and Water 2 x Per WUJ/81 Days Discharge Instructions: May shower and wash wound with dial antibacterial soap and water prior to dressing change. Cleanser: Wound Cleanser (DME) (Generic) 2 x Per Day/15 Days Discharge Instructions: Cleanse the wound with wound cleanser prior to applying a clean dressing using gauze sponges, not tissue or cotton balls. Prim Dressing: Dakin's Solution 0.25%, 16 (oz) (DME) (Generic) 2 x Per Day/15 Days ary Discharge Instructions: Moisten gauze with Dakin's solution Prim Dressing: Basic Conforming Stretch Gauze, Sterile, 2 (in) roll (DME) (Generic) 2 x Per Day/15 Days ary Discharge Instructions: Soak in Dakin's and pack into wound Secondary Dressing: Optifoam Non-Adhesive Dressing, 4x4 in (DME) (Generic) 2 x Per Day/15 Days Discharge Instructions: Apply a foam donut over primary dressing as directed. Secondary Dressing: Woven Gauze Sponge, Non-Sterile 4x4 in (DME) (Generic) 2 x Per Day/15 Days Discharge Instructions: Apply over primary dressing as directed. Secured With: [employer], 4.5x3.1 (in/yd) (DME) (Generic) 2 x Per Day/15 Days Discharge Instructions: Secure with Kerlix as directed. Secured With: 2M Medipore H Soft Cloth Surgical T ape, 4 x 10 (in/yd) (DME) (Generic) 2 x Per Day/15 Days Discharge Instructions: Secure with tape as directed. Secured With: Stretch Net Size 5, 10 (yds) (DME) (Generic) 2 x Per Day/15 Days 1. Dakin's wet-to-dry dressings 2. Doxycycline and Augmentin 3. Front offloading shoe 4. Follow-up in 1 week Addendum: Doxycycline and the of levofloxacin, Augmentin canceled Electronic Signature(s) Signed: 10/04/2021 12:18:02 PM By: Kalman Shan DO Previous Signature: 10/01/2021 1:41:40 PM Version By: Kalman Shan DO Entered By: Kalman Shan on 10/04/2021 10:37:56 -------------------------------------------------------------------------------- HxROS Details Patient Name: Date of Service: [name], [name] 10/01/2021 8:00 A M Medical Record Number: [id number] Patient Account Number: [id number] Date of Birth/Sex: Treating RN: 08-21-1955 (66 y.o. [name], [name] Primary Care Provider: [name] Other Clinician: Referring Provider: Treating Provider/Extender: [name] in Treatment: 0 Information Obtained From Patient Chart Eyes Medical History: Negative for: Cataracts; Glaucoma; Optic Neuritis Ear/Nose/Mouth/Throat Medical History: Negative for: Chronic sinus problems/congestion; Middle ear problems Hematologic/Lymphatic Medical History: Negative for: Anemia; Hemophilia; Human Immunodeficiency Virus; Lymphedema; Sickle Cell Disease Respiratory Medical History: Positive for: Sleep Apnea Negative for: Aspiration; Asthma; Chronic Obstructive Pulmonary Disease (COPD); Pneumothorax; Tuberculosis Cardiovascular Medical History: Positive for: Coronary Artery Disease; Hypertension Negative for: Angina; Arrhythmia; Congestive Heart Failure; Deep Vein Thrombosis; Hypotension;  Myocardial Infarction; Peripheral Arterial Disease; Peripheral Venous Disease; Phlebitis; Vasculitis Past Medical History Notes: hypercholesterolemia Gastrointestinal Medical History: Negative for: Cirrhosis ; Colitis; Crohns; Hepatitis A; Hepatitis B; Hepatitis C Endocrine Medical History: Positive for: Type II Diabetes Negative for: Type I Diabetes Genitourinary Medical History: Negative for: End Stage Renal Disease Immunological Medical History: Negative for: Lupus Erythematosus; Raynauds; Scleroderma Integumentary (Skin) Medical History: Negative for: History of Burn Musculoskeletal Medical History: Negative for: Gout; Rheumatoid Arthritis; Osteoarthritis; Osteomyelitis Neurologic Medical History: Positive for: Neuropathy Negative for: Dementia; Quadriplegia; Paraplegia; Seizure Disorder Immunizations Pneumococcal Vaccine: Received Pneumococcal Vaccination: Yes Received Pneumococcal Vaccination On or After 60th Birthday: Yes Tetanus Vaccine: Last tetanus shot: 12/17/2020 Implantable Devices No devices added Hospitalization / Surgery History Type of Hospitalization/Surgery 2nd and 3rd right toe amps total hysterectomy cholecystectomy 2 knee surgery's on left and 1 on right Family and Social History Unknown History: Yes; Current every day smoker - 1 pack/a/day; Alcohol Use: Never; Drug Use: No History; Caffeine Use: Moderate; Financial Concerns:  No; Food, Clothing or Shelter Needs: No; Support System Lacking: No; Transportation Concerns: No Electronic Signature(s) Signed: 10/01/2021 12:27:41 PM By: Rhae Hammock RN Signed: 10/01/2021 1:41:40 PM By: Kalman Shan DO Entered By: Rhae Hammock on 09/30/2021 16:50:01 -------------------------------------------------------------------------------- SuperBill Details Patient Name: Date of Service: [name], [name] 10/01/2021 Medical Record Number: [id number] Patient Account Number: [id number] Date of  Birth/Sex: Treating RN: 12/24/1955 (66 y.o. [name], [name] Primary Care Provider: [name] Other Clinician: Referring Provider: Treating Provider/Extender: [name] in Treatment: 0 Diagnosis Coding ICD-10 Codes Code Description [phone] Non-pressure 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 E11.621 Type 2 diabetes mellitus with foot ulcer E11.42 Type 2 diabetes mellitus with diabetic polyneuropathy Facility Procedures CPT4 Code: 72094709 Description: 99213 - WOUND CARE VISIT-LEV 3 EST PT Modifier: Quantity: 1 Physician Procedures : CPT4 Code Description Modifier [id number] - WC PHYS LEVEL 4 - EST PT ICD-10 Diagnosis Description L97.512 Non-pressure 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 E11.621 Type 2 diabetes mellitus with foot ulcer E11.42 Type 2  diabetes mellitus with diabetic polyneuropathy Quantity: 1 Electronic Signature(s) Signed: 10/01/2021 1:41:40 PM By: Kalman Shan DO Entered By: Kalman Shan on 10/01/2021 10:21:02

## 2021-10-01 NOTE — Progress Notes (Signed)
[name], HEBEL ([id number]) Visit Report for 10/01/2021 Abuse Risk Screen Details Patient Name: Date of Service: [name], [name] 10/01/2021 8:00 A M Medical Record Number: [id number] Patient Account Number: [id number] Date of Birth/Sex: Treating RN: 15-Oct-1955 (66 y.o. [name], [name] Primary Care Talor Desrosiers: [name] Other Clinician: Referring Seanmichael Salmons: Treating Laelynn Blizzard/Extender: [name] in Treatment: 0 Abuse Risk Screen Items Answer ABUSE RISK SCREEN: Has anyone close to you tried to hurt or harm you recentlyo No Do you feel uncomfortable with anyone in your familyo No Has anyone forced you do things that you didnt want to doo No Electronic Signature(s) Signed: 10/01/2021 12:27:41 PM By: Rhae Hammock RN Entered By: Rhae Hammock on 10/01/2021 08:14:26 -------------------------------------------------------------------------------- Activities of Daily Living Details Patient Name: Date of Service: [name], [name] 10/01/2021 8:00 A M Medical Record Number: [id number] Patient Account Number: [id number] Date of Birth/Sex: Treating RN: 1955-09-25 (66 y.o. [name], [name] Primary Care Karsten Vaughn: [name] Other Clinician: Referring Aseem Sessums: Treating Franki Alcaide/Extender: [name] in Treatment: 0 Activities of Daily Living Items Answer Activities of Daily Living (Please select one for each item) Drive Automobile Completely Able T Medications ake Completely Able Use T elephone Completely Able Care for Appearance Completely Able Use T oilet Completely Able Bath / Shower Completely Able Dress Self Completely Able Feed Self Completely Able Walk Completely Able Get In / Out Bed Completely Able Housework Completely Able Prepare Meals Completely [place] for Self Completely Able Electronic Signature(s) Signed: 10/01/2021 12:27:41 PM  By: Rhae Hammock RN Entered By: Rhae Hammock on 10/01/2021 08:14:42 -------------------------------------------------------------------------------- Education Screening Details Patient Name: Date of Service: [name], [name] 10/01/2021 8:00 A M Medical Record Number: [id number] Patient Account Number: [id number] Date of Birth/Sex: Treating RN: [birth year] (66 y.o. [name], [name] Primary Care Eufemia Prindle: [name] Other Clinician: Referring Berkleigh Beckles: Treating Delle Andrzejewski/Extender: [name] in Treatment: 0 Primary Learner Assessed: Patient Learning Preferences/Education Level/Primary Language Learning Preference: Explanation, Demonstration, Communication Board, Printed Material Highest Education Level: High School Preferred Language: English Cognitive Barrier Language Barrier: No Translator Needed: No Memory Deficit: No Emotional Barrier: No Cultural/Religious Beliefs Affecting Medical Care: No Physical Barrier Impaired Vision: No Impaired Hearing: No Decreased Hand dexterity: No Knowledge/Comprehension Knowledge Level: High Comprehension Level: High Ability to understand written instructions: High Ability to understand verbal instructions: High Motivation Anxiety Level: Calm Cooperation: Cooperative Education Importance: Denies Need Interest in Health Problems: Asks Questions Perception: Coherent Willingness to Engage in Self-Management High Activities: Readiness to Engage in Self-Management High Activities: Electronic Signature(s) Signed: 10/01/2021 12:27:41 PM By: Rhae Hammock RN Entered By: Rhae Hammock on 10/01/2021 08:15:37 -------------------------------------------------------------------------------- Fall Risk Assessment Details Patient Name: Date of Service: [name], [name] 10/01/2021 8:00 A M Medical Record Number: [id number] Patient Account Number: [id number] Date of  Birth/Sex: Treating RN: Sep 17, 1955 (66 y.o. [name], [name] Primary Care Leontae Bostock: [name] Other Clinician: Referring Tanayah Squitieri: Treating Elona Yinger/Extender: [name] in Treatment: 0 Fall Risk Assessment Items Have you had 2 or more falls in the last 12 monthso 0 Yes Have you had any fall that resulted in injury in the last 12 monthso 0 No FALLS RISK SCREEN History of falling - immediate or within 3 months 25 Yes Secondary diagnosis (Do you have 2 or more medical diagnoseso) 0 No Ambulatory aid None/bed rest/wheelchair/nurse 0 No Crutches/cane/walker 0 No Furniture  0 No Intravenous therapy Access/Saline/Heparin Lock 0 No Gait/Transferring Normal/ bed rest/ wheelchair 0 No Weak (short steps with or without shuffle, stooped but able to lift head while walking, may seek 0 No support from furniture) Impaired (short steps with shuffle, may have difficulty arising from chair, head down, impaired 0 No balance) Mental Status Oriented to own ability 0 No Electronic Signature(s) Signed: 10/01/2021 12:27:41 PM By: Rhae Hammock RN Entered By: Rhae Hammock on 10/01/2021 08:15:48 -------------------------------------------------------------------------------- Foot Assessment Details Patient Name: Date of Service: [name], [name] 10/01/2021 8:00 A M Medical Record Number: [id number] Patient Account Number: [id number] Date of Birth/Sex: Treating RN: 1955/10/14 (66 y.o. [name], [name] Primary Care Malik Ruffino: [name] Other Clinician: Referring Acey Woodfield: Treating Ocie Tino/Extender: [name] in Treatment: 0 Foot Assessment Items Site Locations + = Sensation present, - = Sensation absent, C = Callus, U = Ulcer R = Redness, W = Warmth, M = Maceration, PU = Pre-ulcerative lesion F = Fissure, S = Swelling, D = Dryness Assessment Right: Left: Other Deformity: No No Prior Foot  Ulcer: No No Prior Amputation: No No Charcot Joint: No No Ambulatory Status: Ambulatory Without Help Gait: Steady Electronic Signature(s) Signed: 10/01/2021 12:27:41 PM By: Rhae Hammock RN Entered By: Rhae Hammock on 10/01/2021 08:16:26 -------------------------------------------------------------------------------- Nutrition Risk Screening Details Patient Name: Date of Service: [name], [name] 10/01/2021 8:00 A M Medical Record Number: [id number] Patient Account Number: [id number] Date of Birth/Sex: Treating RN: 27-Oct-1955 (66 y.o. [name], [name] Primary Care Sheela Mcculley: [name] Other Clinician: Referring Marley Pakula: Treating Jamarion [place]/Extender: [name] in Treatment: 0 Height (in): Weight (lbs): Body Mass Index (BMI): Nutrition Risk Screening Items Score Screening NUTRITION RISK SCREEN: I have an illness or condition that made me change the kind and/or amount of food I eat 0 No I eat fewer than two meals per day 0 No I eat few fruits and vegetables, or milk products 0 No I have three or more drinks of beer, liquor or wine almost every day 0 No I have tooth or mouth problems that make it hard for me to eat 0 No I don't always have enough money to buy the food I need 0 No I eat alone most of the time 0 No I take three or more different prescribed or over-the-counter drugs a day 0 No Without wanting to, I have lost or gained 10 pounds in the last six months 0 No I am not always physically able to shop, cook and/or feed myself 0 No Nutrition Protocols Good Risk Protocol 0 No interventions needed Moderate Risk Protocol High Risk Proctocol Risk Level: Good Risk Score: 0 Electronic Signature(s) Signed: 10/01/2021 12:27:41 PM By: Rhae Hammock RN Entered By: Rhae Hammock on 10/01/2021 08:15:53

## 2021-10-11 ENCOUNTER — Encounter (HOSPITAL_BASED_OUTPATIENT_CLINIC_OR_DEPARTMENT_OTHER): Payer: Medicare Other | Admitting: Internal Medicine

## 2021-10-11 DIAGNOSIS — E11621 Type 2 diabetes mellitus with foot ulcer: Secondary | ICD-10-CM

## 2021-10-11 DIAGNOSIS — L97512 Non-pressure 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: Secondary | ICD-10-CM | POA: Diagnosis not present

## 2021-10-11 DIAGNOSIS — I10 Essential (primary) hypertension: Secondary | ICD-10-CM | POA: Diagnosis not present

## 2021-10-11 DIAGNOSIS — E1142 Type 2 diabetes mellitus with diabetic polyneuropathy: Secondary | ICD-10-CM | POA: Diagnosis not present

## 2021-10-11 DIAGNOSIS — E1151 Type 2 diabetes mellitus with diabetic peripheral angiopathy without gangrene: Secondary | ICD-10-CM | POA: Diagnosis not present

## 2021-10-11 DIAGNOSIS — I2510 Atherosclerotic heart disease of native coronary artery without angina pectoris: Secondary | ICD-10-CM | POA: Diagnosis not present

## 2021-10-12 NOTE — Progress Notes (Signed)
[name], [name] ([id number]) Visit Report for 10/11/2021 Chief Complaint Document Details Patient Name: Date of Service: [name] [name] K. 10/11/2021 1:45 PM Medical Record Number: [id number] Patient Account Number: [id number] Date of Birth/Sex: Treating RN: 08/24/1955 (66 y.o. [name] Primary Care Provider: Geoffery [name] Other Clinician: Referring Provider: Treating Provider/Extender: [name] in Treatment: 1 Information Obtained from: Patient Chief Complaint 10/27; Right Plantar foot wound 11/21; 2 small areas of skin breakdown to the right foot following cast placement Electronic Signature(s) Signed: 10/11/2021 5:07:45 PM By: [name] Shan DO Entered By: [name] on 10/11/2021 15:20:19 -------------------------------------------------------------------------------- HPI Details Patient Name: Date of Service: [name] RD, [name] 10/11/2021 1:45 PM Medical Record Number: [id number] Patient Account Number: [id number] Date of Birth/Sex: Treating RN: 1955/12/01 (66 y.o. [name] Primary Care Provider: Geoffery [name] Other Clinician: Referring Provider: Treating Provider/Extender: [name] in Treatment: 1 History of Present Illness HPI Description: Admission 12/17/2020 [name] a 66 year old female with a past medical history of insulin-dependent type 2 diabetes, hypothyroidism and daily1 pack per day cigarette smoker the presents to the clinic for a 6-week history of nonhealing wound to the right first MTPJ. She has been following with [name], podiatry for this issue. She has been using silver alginate with dressing changes. She uses a postsurgical shoe and offloading pads. She currently denies signs of infection. 10/31; patient presents for follow-up. She tolerated the soft cast fine although she states that she felt her foot rolling to one side.  She denies signs of infection. She would like to do the total contact cast today. 12/23/2020 upon evaluation today patient appears to be doing excellent in regard to her wound on the foot and she is in a total contact cast. I do think this is appropriate this is the first cast change which we are obliged to do to ensure nothing is rubbing everything appears to be doing quite well and very pleased in that regard. 11/7; patient presents for follow-up. She had no issues with the cast. She denies signs of infection. 11/14; patient presents for 1 week follow-up. She has had no issues with the cast. She denies signs of infection. 11/21; patient presents for 1 week follow-up. She did develop 2 small areas of skin breakdown on either side of the right foot from the cast rubbing. She states she did not feel the wounds developing. She currently denies signs of infection. 11/28; patient presents for 1 week follow-up. She has no issues or complaints today. She denies pain or acute signs of infection. 12/12; patient presents for follow-up. She reports improvement to her right lateral foot wound. She has been using silver alginate to the area. She has no issues or complaints today. 12/19; patient presents for follow-up. She reports that her right lateral foot wound has healed. She has no issues or complaints today. 1/3; patient presents for follow-up. She has no issues or complaints today. She reports no open wounds. Readmission 10/01/2021 [name] a 66 year old female's with a past medical history of insulin-dependent controlled type 2 diabetes complicated by peripheral neuropathy that presents to the clinic for a 81-monthhistory of nonhealing ulcer to the right foot. I have seen her before for a wound to the same area that was treated and healed. Her wound today however this is much deeper and it has thick yellow drainage. She is not sure exactly how it started. She  noticed it 1 day. She has been  following with [name] for this issue. She had a wound culture that showed a mix of organisms on 09/01/2021. She was recently started on doxycycline. She is using the Conservation officer, nature. She has been using Iodosorb with dressing changes. She currently denies systemic signs of infection. 8/21; patient presents for follow-up. She states she started and completed levofloxacin. She is still taking doxycycline. She reports improvement in drainage and odor. She has been doing Dakin's wet-to-dry dressings as well. She is using her front offloading shoe. She denies signs of infection. Electronic Signature(s) Signed: 10/11/2021 5:07:45 PM By: [name] Shan DO Entered By: [name] on 10/11/2021 15:21:00 -------------------------------------------------------------------------------- Physical Exam Details Patient Name: Date of Service: [name] RD, [name] 10/11/2021 1:45 PM Medical Record Number: [id number] Patient Account Number: [id number] Date of Birth/Sex: Treating RN: [birth year] (66 y.o. [name] Primary Care Provider: Geoffery [name] Other Clinician: Referring Provider: Treating Provider/Extender: [name] in Treatment: 1 Constitutional respirations regular, non-labored and within target range for patient.. Cardiovascular 2+ dorsalis pedis/posterior tibialis pulses. Psychiatric pleasant and cooperative. Notes Right foot: T the first submetatarsal there is an open wound with circumferential undermining. Maceration to the periwound. No drainage noted. Healthy o granulation tissue noted at the opening. Does not probe to bone. Electronic Signature(s) Signed: 10/11/2021 5:07:45 PM By: [name] Shan DO Entered By: [name] on 10/11/2021 15:21:45 -------------------------------------------------------------------------------- Physician Orders Details Patient Name: Date of Service: [name] RD, [name] 10/11/2021 1:45  PM Medical Record Number: [id number] Patient Account Number: [id number] Date of Birth/Sex: Treating RN: April 14, 1955 (66 y.o. [name] Primary Care Provider: Geoffery [name] Other Clinician: Referring Provider: Treating Provider/Extender: [name] in Treatment: 1 Verbal / Phone Orders: No Diagnosis Coding Follow-up Appointments ppointment in 1 week. - 10/19/21 @ 0915 w/ Dr. Heber [place] and Elias Else # 9 Return A Bathing/ Shower/ Hygiene [name] shower with protection but do not get wound dressing(s) wet. Edema Control - Lymphedema / SCD / Other Elevate legs to the level of the heart or above for 30 minutes daily and/or when sitting, a frequency of: Avoid standing for long periods of time. Off-Loading Open toe surgical shoe to: - front offloader to right foot Wound Treatment Wound #4 - Metatarsal head first Wound Laterality: Plantar, Right Cleanser: Soap and Water 2 x Per Day/15 Days Discharge Instructions: [name] shower and wash wound with dial antibacterial soap and water prior to dressing change. Cleanser: Wound Cleanser (Generic) 2 x Per Day/15 Days Discharge Instructions: Cleanse the wound with wound cleanser prior to applying a clean dressing using gauze sponges, not tissue or cotton balls. Prim Dressing: KerraCel Ag Gelling Fiber Dressing, 4x5 in (silver alginate) (DME) (Generic) 2 x Per Day/15 Days ary Discharge Instructions: Apply silver alginate to wound bed as instructed Secondary Dressing: Optifoam Non-Adhesive Dressing, 4x4 in (Generic) 2 x Per Day/15 Days Discharge Instructions: Apply a foam donut over primary dressing as directed. Secondary Dressing: Woven Gauze Sponge, Non-Sterile 4x4 in (Generic) 2 x Per Day/15 Days Discharge Instructions: Apply over primary dressing as directed. Secured With: [employer], 4.5x3.1 (in/yd) (Generic) 2 x Per Day/15 Days Discharge Instructions: Secure with Kerlix as directed. Secured With: 74M  Medipore H Soft Cloth Surgical T ape, 4 x 10 (in/yd) (Generic) 2 x Per Day/15 Days Discharge Instructions: Secure with tape as directed. Secured With: Stretch Net Size 5, 10 (yds) (Generic) 2 x Per Day/15 Days  Electronic Signature(s) Signed: 10/11/2021 5:07:45 PM By: [name] Shan DO Previous Signature: 10/11/2021 3:19:18 PM Version By: [name] Shan DO Entered By: [name] on 10/11/2021 15:21:55 -------------------------------------------------------------------------------- Problem List Details Patient Name: Date of Service: [name] RD, [name] 10/11/2021 1:45 PM Medical Record Number: [id number] Patient Account Number: [id number] Date of Birth/Sex: Treating RN: Dec 14, 1955 (66 y.o. [name] Primary Care Provider: Geoffery [name] Other Clinician: Referring Provider: Treating Provider/Extender: [name] in Treatment: 1 Active Problems ICD-10 Encounter Code Description Active Date MDM Diagnosis L97.512 Non-pressure 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 10/01/2021 No Yes E11.621 Type 2 diabetes mellitus with foot ulcer 10/01/2021 No Yes E11.42 Type 2 diabetes mellitus with diabetic polyneuropathy 10/01/2021 No Yes Inactive Problems Resolved Problems Electronic Signature(s) Signed: 10/11/2021 5:07:45 PM By: [name] Shan DO Entered By: [name] on 10/11/2021 15:20:00 -------------------------------------------------------------------------------- Progress Note Details Patient Name: Date of Service: [name] RD, [name] 10/11/2021 1:45 PM Medical Record Number: [id number] Patient Account Number: [id number] Date of Birth/Sex: Treating RN: 1955-10-09 (66 y.o. [name] Primary Care Provider: Geoffery [name] Other Clinician: Referring Provider: Treating Provider/Extender: [name] in Treatment: 1 Subjective Chief  Complaint Information obtained from Patient 10/27; Right Plantar foot wound 11/21; 2 small areas of skin breakdown to the right foot following cast placement History of Present Illness (HPI) Admission 12/17/2020 Ms. Brandolyn Shortridge is a 66 year old female with a past medical history of insulin-dependent type 2 diabetes, hypothyroidism and daily1 pack per day cigarette smoker the presents to the clinic for a 6-week history of nonhealing wound to the right first MTPJ. She has been following with [name], podiatry for this issue. She has been using silver alginate with dressing changes. She uses a postsurgical shoe and offloading pads. She currently denies signs of infection. 10/31; patient presents for follow-up. She tolerated the soft cast fine although she states that she felt her foot rolling to one side. She denies signs of infection. She would like to do the total contact cast today. 12/23/2020 upon evaluation today patient appears to be doing excellent in regard to her wound on the foot and she is in a total contact cast. I do think this is appropriate this is the first cast change which we are obliged to do to ensure nothing is rubbing everything appears to be doing quite well and very pleased in that regard. 11/7; patient presents for follow-up. She had no issues with the cast. She denies signs of infection. 11/14; patient presents for 1 week follow-up. She has had no issues with the cast. She denies signs of infection. 11/21; patient presents for 1 week follow-up. She did develop 2 small areas of skin breakdown on either side of the right foot from the cast rubbing. She states she did not feel the wounds developing. She currently denies signs of infection. 11/28; patient presents for 1 week follow-up. She has no issues or complaints today. She denies pain or acute signs of infection. 12/12; patient presents for follow-up. She reports improvement to her right lateral foot wound. She has  been using silver alginate to the area. She has no issues or complaints today. 12/19; patient presents for follow-up. She reports that her right lateral foot wound has healed. She has no issues or complaints today. 1/3; patient presents for follow-up. She has no issues or complaints today. She reports no open wounds. Readmission 10/01/2021 Ms. Chau Savell is  a 66 year old female's with a past medical history of insulin-dependent controlled type 2 diabetes complicated by peripheral neuropathy that presents to the clinic for a 78-monthhistory of nonhealing ulcer to the right foot. I have seen her before for a wound to the same area that was treated and healed. Her wound today however this is much deeper and it has thick yellow drainage. She is not sure exactly how it started. She noticed it 1 day. She has been following with Dr. EAmalia Haileyfor this issue. She had a wound culture that showed a mix of organisms on 09/01/2021. She was recently started on doxycycline. She is using the dConservation officer, nature She has been using Iodosorb with dressing changes. She currently denies systemic signs of infection. 8/21; patient presents for follow-up. She states she started and completed levofloxacin. She is still taking doxycycline. She reports improvement in drainage and odor. She has been doing Dakin's wet-to-dry dressings as well. She is using her front offloading shoe. She denies signs of infection. Patient History Information obtained from Patient, Chart. Family History Unknown History. Social History Current every day smoker - 1 pack/a/day, Alcohol Use - Never, Drug Use - No History, Caffeine Use - Moderate. Medical History Eyes Denies history of Cataracts, Glaucoma, Optic Neuritis Ear/Nose/Mouth/Throat Denies history of Chronic sinus problems/congestion, Middle ear problems Hematologic/Lymphatic Denies history of Anemia, Hemophilia, Human Immunodeficiency Virus, Lymphedema, Sickle Cell  Disease Respiratory Patient has history of Sleep Apnea Denies history of Aspiration, Asthma, Chronic Obstructive Pulmonary Disease (COPD), Pneumothorax, Tuberculosis Cardiovascular Patient has history of Coronary Artery Disease, Hypertension Denies history of Angina, Arrhythmia, Congestive Heart Failure, Deep Vein Thrombosis, Hypotension, Myocardial Infarction, Peripheral Arterial Disease, Peripheral Venous Disease, Phlebitis, Vasculitis Gastrointestinal Denies history of Cirrhosis , Colitis, Crohnoos, Hepatitis A, Hepatitis B, Hepatitis C Endocrine Patient has history of Type II Diabetes Denies history of Type I Diabetes Genitourinary Denies history of End Stage Renal Disease Immunological Denies history of Lupus Erythematosus, Raynaudoos, Scleroderma Integumentary (Skin) Denies history of History of Burn Musculoskeletal Denies history of Gout, Rheumatoid Arthritis, Osteoarthritis, Osteomyelitis Neurologic Patient has history of Neuropathy Denies history of Dementia, Quadriplegia, Paraplegia, Seizure Disorder Hospitalization/Surgery History - 2nd and 3rd right toe amps. - total hysterectomy. - cholecystectomy. - 2 knee surgery's on left and 1 on right. Medical A Surgical History Notes nd Cardiovascular hypercholesterolemia Objective Constitutional respirations regular, non-labored and within target range for patient.. Vitals Time Taken: 2:01 PM, Temperature: 98.1 F, Pulse: 78 bpm, Respiratory Rate: 16 breaths/min, Blood Pressure: 142/79 mmHg. Cardiovascular 2+ dorsalis pedis/posterior tibialis pulses. Psychiatric pleasant and cooperative. General Notes: Right foot: T the first submetatarsal there is an open wound with circumferential undermining. Maceration to the periwound. No drainage noted. o Healthy granulation tissue noted at the opening. Does not probe to bone. Integumentary (Hair, Skin) Wound #4 status is Open. Original cause of wound was Gradually Appeared. The  date acquired was: 07/22/2021. The wound has been in treatment 1 weeks. The wound is located on the Right,Plantar Metatarsal head first. The wound measures 0.7cm length x 0.6cm width x 0.7cm depth; 0.33cm^2 area and 0.231cm^3 volume. There is Fat Layer (Subcutaneous Tissue) exposed. There is no tunneling noted, however, there is undermining starting at 12:00 and ending at 12:00 with a maximum distance of 1cm. There is a large amount of purulent drainage noted. The wound margin is distinct with the outline attached to the wound base. There is medium (34-66%) red, pink granulation within the wound bed. There is a medium (34-66%) amount of necrotic tissue within  the wound bed including Adherent Slough. Assessment Active Problems ICD-10 Non-pressure 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 Type 2 diabetes mellitus with foot ulcer Type 2 diabetes mellitus with diabetic polyneuropathy Patient's wound has shown improvement in size and appearance since last clinic visit. She has maceration to the periwound and I asked her to stop using Dakin's wet-to-dry dressing. I recommended switching to silver alginate at this time. Continue aggressive offloading with her front offloading shoe. No need for additional antibiotics at this time. She [name] finish out her doxycycline. If she does well over the next week she [name] benefit from a total contact cast. Plan Follow-up Appointments: Return Appointment in 1 week. - 10/19/21 @ 0915 w/ Dr. Heber  and Allayne Butcher Rm # 9 Bathing/ Shower/ Hygiene: [name] shower with protection but do not get wound dressing(s) wet. Edema Control - Lymphedema / SCD / Other: Elevate legs to the level of the heart or above for 30 minutes daily and/or when sitting, a frequency of: Avoid standing for long periods of time. Off-Loading: Open toe surgical shoe to: - front offloader to right foot WOUND #4: - Metatarsal head first Wound Laterality: Plantar, Right Cleanser: Soap  and Water 2 x Per Day/15 Days Discharge Instructions: [name] shower and wash wound with dial antibacterial soap and water prior to dressing change. Cleanser: Wound Cleanser (Generic) 2 x Per Day/15 Days Discharge Instructions: Cleanse the wound with wound cleanser prior to applying a clean dressing using gauze sponges, not tissue or cotton balls. Prim Dressing: KerraCel Ag Gelling Fiber Dressing, 4x5 in (silver alginate) (DME) (Generic) 2 x Per Day/15 Days ary Discharge Instructions: Apply silver alginate to wound bed as instructed Secondary Dressing: Optifoam Non-Adhesive Dressing, 4x4 in (Generic) 2 x Per Day/15 Days Discharge Instructions: Apply a foam donut over primary dressing as directed. Secondary Dressing: Woven Gauze Sponge, Non-Sterile 4x4 in (Generic) 2 x Per Day/15 Days Discharge Instructions: Apply over primary dressing as directed. Secured With: [employer], 4.5x3.1 (in/yd) (Generic) 2 x Per Day/15 Days Discharge Instructions: Secure with Kerlix as directed. Secured With: 4M Medipore H Soft Cloth Surgical T ape, 4 x 10 (in/yd) (Generic) 2 x Per Day/15 Days Discharge Instructions: Secure with tape as directed. Secured With: Stretch Net Size 5, 10 (yds) (Generic) 2 x Per Day/15 Days 1. Silver alginate 2. Aggressive offloadingoofront offloading shoe 3. Follow-up in 1 week Electronic Signature(s) Signed: 10/11/2021 5:07:45 PM By: [name] Shan DO Entered By: [name] on 10/11/2021 15:23:40 -------------------------------------------------------------------------------- HxROS Details Patient Name: Date of Service: [name] RD, [name] 10/11/2021 1:45 PM Medical Record Number: [id number] Patient Account Number: [id number] Date of Birth/Sex: Treating RN: 08/14/1955 (66 y.o. [name] Primary Care Provider: Geoffery [name] Other Clinician: Referring Provider: Treating Provider/Extender: [name] in Treatment:  1 Information Obtained From Patient Chart Eyes Medical History: Negative for: Cataracts; Glaucoma; Optic Neuritis Ear/Nose/Mouth/Throat Medical History: Negative for: Chronic sinus problems/congestion; Middle ear problems Hematologic/Lymphatic Medical History: Negative for: Anemia; Hemophilia; Human Immunodeficiency Virus; Lymphedema; Sickle Cell Disease Respiratory Medical History: Positive for: Sleep Apnea Negative for: Aspiration; Asthma; Chronic Obstructive Pulmonary Disease (COPD); Pneumothorax; Tuberculosis Cardiovascular Medical History: Positive for: Coronary Artery Disease; Hypertension Negative for: Angina; Arrhythmia; Congestive Heart Failure; Deep Vein Thrombosis; Hypotension; Myocardial Infarction; Peripheral Arterial Disease; Peripheral Venous Disease; Phlebitis; Vasculitis Past Medical History Notes: hypercholesterolemia Gastrointestinal Medical History: Negative for: Cirrhosis ; Colitis; Crohns; Hepatitis A; Hepatitis B; Hepatitis C Endocrine Medical History: Positive for: Type II  Diabetes Negative for: Type I Diabetes Genitourinary Medical History: Negative for: End Stage Renal Disease Immunological Medical History: Negative for: Lupus Erythematosus; Raynauds; Scleroderma Integumentary (Skin) Medical History: Negative for: History of Burn Musculoskeletal Medical History: Negative for: Gout; Rheumatoid Arthritis; Osteoarthritis; Osteomyelitis Neurologic Medical History: Positive for: Neuropathy Negative for: Dementia; Quadriplegia; Paraplegia; Seizure Disorder Immunizations Pneumococcal Vaccine: Received Pneumococcal Vaccination: Yes Received Pneumococcal Vaccination On or After 60th Birthday: Yes Tetanus Vaccine: Last tetanus shot: 12/17/2020 Implantable Devices No devices added Hospitalization / Surgery History Type of Hospitalization/Surgery 2nd and 3rd right toe amps total hysterectomy cholecystectomy 2 knee surgery's on left and 1 on  right Family and Social History Unknown History: Yes; Current every day smoker - 1 pack/a/day; Alcohol Use: Never; Drug Use: No History; Caffeine Use: Moderate; Financial Concerns: No; Food, Clothing or Shelter Needs: No; Support System Lacking: No; Transportation Concerns: No Electronic Signature(s) Signed: 10/11/2021 5:07:45 PM By: [name] Shan DO Signed: 10/12/2021 3:41:21 PM By: Rhae Hammock RN Entered By: [name] on 10/11/2021 15:21:06 -------------------------------------------------------------------------------- SuperBill Details Patient Name: Date of Service: [name] RD, [name] 10/11/2021 Medical Record Number: [id number] Patient Account Number: [id number] Date of Birth/Sex: Treating RN: 15-Mar-1955 (66 y.o. [name] Primary Care Provider: Geoffery [name] Other Clinician: Referring Provider: Treating Provider/Extender: [name] in Treatment: 1 Diagnosis Coding ICD-10 Codes Code Description [phone] Non-pressure 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 E11.621 Type 2 diabetes mellitus with foot ulcer E11.42 Type 2 diabetes mellitus with diabetic polyneuropathy Physician Procedures : CPT4 Code Description Modifier [id number] - WC PHYS LEVEL 3 - EST PT ICD-10 Diagnosis Description L97.512 Non-pressure 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 E11.621 Type 2 diabetes mellitus with foot ulcer E11.42 Type 2  diabetes mellitus with diabetic polyneuropathy Quantity: 1 Electronic Signature(s) Signed: 10/11/2021 5:07:45 PM By: [name] Shan DO Entered By: [name] on 10/11/2021 15:23:54

## 2021-10-12 NOTE — Progress Notes (Signed)
[name], [name] ([id number]) Visit Report for 10/11/2021 Arrival Information Details Patient Name: Date of Service: [name] 10/11/2021 1:45 PM Medical Record Number: [id number] Patient Account Number: [id number] Date of Birth/Sex: Treating RN: December 19, 1955 (66 y.o. [name], [name] Primary Care [name] Other Clinician: Referring Grover Woodfield: Treating Granvel Proudfoot/Extender: [name] in Treatment: 1 Visit Information History Since Last Visit Added or deleted any medications: No Patient Arrived: Ambulatory Any new allergies or adverse reactions: No Arrival Time: 13:59 Had [name] fall or experienced change in No Accompanied By: self activities of daily living that may affect Transfer Assistance: None risk of falls: Patient Identification Verified: Yes Signs or symptoms of abuse/neglect since last visito No Secondary Verification Process Completed: Yes Hospitalized since last visit: No Patient Requires Transmission-Based Precautions: No Implantable device outside of the clinic excluding No Patient Has Alerts: No cellular tissue based products placed in the center since last visit: Has Dressing in Place as Prescribed: Yes Pain Present Now: No Electronic Signature(s) Signed: 10/11/2021 4:55:34 PM By: Erenest Blank Entered By: Erenest Blank on 10/11/2021 14:00:11 -------------------------------------------------------------------------------- Lower Extremity Assessment Details Patient Name: Date of Service: [name] [name], [name] 10/11/2021 1:45 PM Medical Record Number: [id number] Patient Account Number: [id number] Date of Birth/Sex: Treating RN: 1956/01/29 (66 y.o. [name], [name] Primary Care Mitsuko Luera: [name] Other Clinician: Referring Brea Coleson: Treating Asherah Lavoy/Extender: Glenna Fellows Weeks in Treatment: 1 Edema Assessment Assessed: [Left: No] [Right: No] Edema: [Left:  Ye] [Right: s] Calf Left: Right: Point of Measurement: From Medial Instep 47 cm Ankle Left: Right: Point of Measurement: From Medial Instep 26.7 cm Electronic Signature(s) Signed: 10/11/2021 4:55:34 PM By: Erenest Blank Signed: 10/12/2021 3:41:21 PM By: Rhae Hammock RN Entered By: Erenest Blank on 10/11/2021 14:13:42 -------------------------------------------------------------------------------- Multi Wound Chart Details Patient Name: Date of Service: [name] [name], [name] 10/11/2021 1:45 PM Medical Record Number: [id number] Patient Account Number: [id number] Date of Birth/Sex: Treating RN: 11-14-1955 (66 y.o. [name], [name] Primary Care Albin Duckett: [name] Other Clinician: Referring Analeia Ismael: Treating Katalea Ucci/Extender: [name] in Treatment: 1 Vital Signs Height(in): Pulse(bpm): 65 Weight(lbs): Blood Pressure(mmHg): 142/79 Body Mass Index(BMI): Temperature(F): 98.1 Respiratory Rate(breaths/min): 16 Photos: [N/[name]:N/[name]] Right, Plantar Metatarsal head first N/[name] N/[name] Wound Location: Gradually Appeared N/[name] N/[name] Wounding Event: Diabetic Wound/Ulcer of the Lower N/[name] N/[name] Primary Etiology: Extremity Sleep Apnea, Coronary Artery N/[name] N/[name] Comorbid History: Disease, Hypertension, Type II Diabetes, Neuropathy 07/22/2021 N/[name] N/[name] Date Acquired: 1 N/[name] N/[name] Weeks of Treatment: Open N/[name] N/[name] Wound Status: No N/[name] N/[name] Wound Recurrence: Yes N/[name] N/[name] Pending [name] mputation on Presentation: 0.7x0.6x0.7 N/[name] N/[name] Measurements L x W x D (cm) 0.33 N/[name] N/[name] [name] (cm) : rea 0.231 N/[name] N/[name] Volume (cm) : -364.80% N/[name] N/[name] % Reduction in [name] rea: -48.10% N/[name] N/[name] % Reduction in Volume: 12 Starting Position 1 (o'clock): 12 Ending Position 1 (o'clock): 1 Maximum Distance 1 (cm): Yes N/[name] N/[name] Undermining: Grade 2 N/[name] N/[name] Classification: Large N/[name] N/[name] Exudate [name] mount: Purulent N/[name] N/[name] Exudate Type: yellow, brown, green N/[name] N/[name] Exudate  Color: Distinct, outline attached N/[name] N/[name] Wound Margin: Medium (34-66%) N/[name] N/[name] Granulation [name] mount: Red, Pink N/[name] N/[name] Granulation Quality: Medium (34-66%) N/[name] N/[name] Necrotic [name] mount: Fat Layer (Subcutaneous Tissue): Yes N/[name] N/[name] Exposed Structures: Fascia: No Tendon: No Muscle: No Joint: No Bone: No None N/[name] N/[name] Epithelialization: Treatment Notes Electronic Signature(s) Signed: 10/11/2021 5:07:45 PM By: Kalman Shan DO Signed:  10/12/2021 3:41:21 PM By: Rhae Hammock RN Signed: 10/12/2021 3:41:21 PM By: Rhae Hammock RN Entered By: Kalman Shan on 10/11/2021 15:20:05 -------------------------------------------------------------------------------- Multi-Disciplinary Care Plan Details Patient Name: Date of Service: [name] [name], [name] 10/11/2021 1:45 PM Medical Record Number: [id number] Patient Account Number: [id number] Date of Birth/Sex: Treating RN: January 21, 1956 (66 y.o. [name], [name] Primary Care Joeph Szatkowski: [name] Other Clinician: Referring Leili Eskenazi: Treating Song Myre/Extender: [name] in Treatment: 1 Active Inactive Wound/Skin Impairment Nursing Diagnoses: Impaired tissue integrity Knowledge deficit related to ulceration/compromised skin integrity Goals: Patient will have [name] decrease in wound volume by X% from date: (specify in notes) Date Initiated: 10/01/2021 Target Resolution Date: 10/29/2021 Goal Status: Active Patient/caregiver will verbalize understanding of skin care regimen Date Initiated: 10/01/2021 Target Resolution Date: 10/25/2021 Goal Status: Active Ulcer/skin breakdown will have [name] volume reduction of 30% by week 4 Date Initiated: 10/01/2021 Target Resolution Date: 10/28/2021 Goal Status: Active Interventions: Assess patient/caregiver ability to obtain necessary supplies Assess patient/caregiver ability to perform ulcer/skin care regimen upon admission and as needed Assess ulceration(s)  every visit Notes: Electronic Signature(s) Signed: 10/12/2021 3:41:21 PM By: Rhae Hammock RN Entered By: Rhae Hammock on 10/11/2021 14:20:12 -------------------------------------------------------------------------------- Pain Assessment Details Patient Name: Date of Service: [name] [name], [name] 10/11/2021 1:45 PM Medical Record Number: [id number] Patient Account Number: [id number] Date of Birth/Sex: Treating RN: [birth year] (66 y.o. [name], [name] Primary Care Keynan Heffern: [name] Other Clinician: Referring Joven Mom: Treating Dontay Harm/Extender: [name] in Treatment: 1 Active Problems Location of Pain Severity and Description of Pain Patient Has Paino No Site Locations Pain Management and Medication Current Pain Management: Electronic Signature(s) Signed: 10/11/2021 4:55:34 PM By: Erenest Blank Signed: 10/12/2021 3:41:21 PM By: Rhae Hammock RN Entered By: Erenest Blank on 10/11/2021 14:02:17 -------------------------------------------------------------------------------- Patient/Caregiver Education Details Patient Name: Date of Service: [name] [name], [name] Justice Deeds 8/21/2023andnbsp1:45 PM Medical Record Number: [id number] Patient Account Number: [id number] Date of Birth/Gender: Treating RN: 1956-01-07 (66 y.o. [name], [name] Primary Care Physician: [name] Other Clinician: Referring Physician: Treating Physician/Extender: [name] in Treatment: 1 Education Assessment Education Provided To: Patient Education Topics Provided Wound/Skin Impairment: Methods: Explain/Verbal Responses: Reinforcements needed, State content correctly Electronic Signature(s) Signed: 10/12/2021 3:41:21 PM By: Rhae Hammock RN Entered By: Rhae Hammock on 10/11/2021 14:20:22 -------------------------------------------------------------------------------- Wound Assessment  Details Patient Name: Date of Service: [name] [name], [name] 10/11/2021 1:45 PM Medical Record Number: [id number] Patient Account Number: [id number] Date of Birth/Sex: Treating RN: [birth year] (66 y.o. [name], [name] Primary Care Cleopatra Sardo: [name] Other Clinician: Referring Michaelia Beilfuss: Treating Jadae Steinke/Extender: [name] in Treatment: 1 Wound Status Wound Number: 4 Primary Diabetic Wound/Ulcer of the Lower Extremity Etiology: Wound Location: Right, Plantar Metatarsal head first Wound Open Wounding Event: Gradually Appeared Status: Date Acquired: 07/22/2021 Comorbid Sleep Apnea, Coronary Artery Disease, Hypertension, Type II Weeks Of Treatment: 1 History: Diabetes, Neuropathy Clustered Wound: No Pending Amputation On Presentation Photos Wound Measurements Length: (cm) 0.7 Width: (cm) 0.6 Depth: (cm) 0.7 Area: (cm) 0.33 Volume: (cm) 0.231 % Reduction in Area: -364.8% % Reduction in Volume: -48.1% Epithelialization: None Tunneling: No Undermining: Yes Starting Position (o'clock): 12 Ending Position (o'clock): 12 Maximum Distance: (cm) 1 Wound Description Classification: Grade 2 Wound Margin: Distinct, outline attached Exudate Amount: Large Exudate Type: Purulent Exudate Color: yellow, brown, green Foul Odor After Cleansing: No Slough/Fibrino Yes Wound Bed Granulation Amount: Medium (34-66%) Exposed Structure Granulation  Quality: Red, Pink Fascia Exposed: No Necrotic Amount: Medium (34-66%) Fat Layer (Subcutaneous Tissue) Exposed: Yes Necrotic Quality: Adherent Slough Tendon Exposed: No Muscle Exposed: No Joint Exposed: No Bone Exposed: No Electronic Signature(s) Signed: 10/11/2021 4:55:34 PM By: Erenest Blank Signed: 10/12/2021 3:41:21 PM By: Rhae Hammock RN Entered By: Erenest Blank on 10/11/2021 14:16:32 -------------------------------------------------------------------------------- Vitals  Details Patient Name: Date of Service: [name] [name], [name] 10/11/2021 1:45 PM Medical Record Number: [id number] Patient Account Number: [id number] Date of Birth/Sex: Treating RN: [birth year] (66 y.o. [name], [name] Primary Care Bertram Haddix: [name] Other Clinician: Referring Carmine Youngberg: Treating Quantay Zaremba/Extender: [name] in Treatment: 1 Vital Signs Time Taken: 14:01 Temperature (F): 98.1 Pulse (bpm): 78 Respiratory Rate (breaths/min): 16 Blood Pressure (mmHg): 142/79 Reference [name]: 80 - 120 mg / dl Electronic Signature(s) Signed: 10/11/2021 4:55:34 PM By: Erenest Blank Entered By: Erenest Blank on 10/11/2021 14:02:10

## 2021-10-19 ENCOUNTER — Encounter (HOSPITAL_BASED_OUTPATIENT_CLINIC_OR_DEPARTMENT_OTHER): Payer: Medicare Other | Admitting: Internal Medicine

## 2021-10-19 DIAGNOSIS — E1151 Type 2 diabetes mellitus with diabetic peripheral angiopathy without gangrene: Secondary | ICD-10-CM | POA: Diagnosis not present

## 2021-10-19 DIAGNOSIS — I2510 Atherosclerotic heart disease of native coronary artery without angina pectoris: Secondary | ICD-10-CM | POA: Diagnosis not present

## 2021-10-19 DIAGNOSIS — E11621 Type 2 diabetes mellitus with foot ulcer: Secondary | ICD-10-CM | POA: Diagnosis not present

## 2021-10-19 DIAGNOSIS — L97512 Non-pressure 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: Secondary | ICD-10-CM

## 2021-10-19 DIAGNOSIS — E1142 Type 2 diabetes mellitus with diabetic polyneuropathy: Secondary | ICD-10-CM | POA: Diagnosis not present

## 2021-10-19 DIAGNOSIS — I10 Essential (primary) hypertension: Secondary | ICD-10-CM | POA: Diagnosis not present

## 2021-10-22 NOTE — Progress Notes (Signed)
[name], [name] ([id number]) Visit Report for 10/19/2021 Chief Complaint Document Details Patient Name: Date of Service: [name] May [name] 10/19/2021 9:15 A M Medical Record Number: [id number] Patient Account Number: [id number] Date of Birth/Sex: Treating RN: 10-Oct-1955 (66 y.o. [name], [name] Primary Care Provider: [name] Other Clinician: Referring Provider: Treating Provider/Extender: [name] in Treatment: 2 Information Obtained from: Patient Chief Complaint 10/27; Right Plantar foot wound 11/21; 2 small areas of skin breakdown to the right foot following cast placement 8/29; right plantar foot wound Electronic Signature(s) Signed: 10/19/2021 12:41:14 PM By: Kalman Shan DO Entered By: Kalman Shan on 10/19/2021 11:40:54 -------------------------------------------------------------------------------- Debridement Details Patient Name: Date of Service: [name] RD, [name] 10/19/2021 9:15 A M Medical Record Number: [id number] Patient Account Number: [id number] Date of Birth/Sex: Treating RN: [birth year] (66 y.o. [name], [name] Primary Care Provider: [name] Other Clinician: Referring Provider: Treating Provider/Extender: [name] in Treatment: 2 Debridement Performed for Assessment: Wound #4 Right,Plantar Metatarsal head first Performed By: Physician Kalman Shan, DO Debridement Type: Debridement Severity of Tissue Pre Debridement: Fat layer exposed Level of Consciousness (Pre-procedure): Awake and Alert Pre-procedure Verification/Time Out Yes - 10:02 Taken: Start Time: 10:02 Pain Control: Lidocaine T Area Debrided (L x W): otal 1.5 (cm) x 2.5 (cm) = 3.75 (cm) Tissue and other material debrided: Viable, Non-Viable, Callus, Slough, Subcutaneous, Slough Level: Skin/Subcutaneous Tissue Debridement Description: Excisional Instrument: Curette Bleeding:  Minimum Hemostasis Achieved: Pressure End Time: 10:02 Procedural Pain: 0 Post Procedural Pain: 0 Response to Treatment: Procedure was tolerated well Level of Consciousness (Post- Awake and Alert procedure): Post Debridement Measurements of Total Wound Length: (cm) 1.5 Width: (cm) 2.5 Depth: (cm) 1.7 Volume: (cm) 5.007 Character of Wound/Ulcer Post Debridement: Improved Severity of Tissue Post Debridement: Fat layer exposed Post Procedure Diagnosis Same as Pre-procedure Electronic Signature(s) Signed: 10/19/2021 12:41:14 PM By: Kalman Shan DO Signed: 10/19/2021 5:09:16 PM By: Deon Pilling RN, BSN Entered By: Deon Pilling on 10/19/2021 10:03:34 -------------------------------------------------------------------------------- HPI Details Patient Name: Date of Service: [name], [name] 10/19/2021 9:15 A M Medical Record Number: [id number] Patient Account Number: [id number] Date of Birth/Sex: Treating RN: 1956/01/22 (66 y.o. [name], [name] Primary Care Provider: [name] Other Clinician: Referring Provider: Treating Provider/Extender: [name] in Treatment: 2 History of Present Illness HPI Description: Admission 12/17/2020 [name] is a 66 year old female with a past medical history of insulin-dependent type 2 diabetes, hypothyroidism and daily1 pack per day cigarette smoker the presents to the clinic for a 6-week history of nonhealing wound to the right first MTPJ. She has been following with [name], podiatry for this issue. She has been using silver alginate with dressing changes. She uses a postsurgical shoe and offloading pads. She currently denies signs of infection. 10/31; patient presents for follow-up. She tolerated the soft cast fine although she states that she felt her foot rolling to one side. She denies signs of infection. She would like to do the total contact cast today. 12/23/2020 upon  evaluation today patient appears to be doing excellent in regard to her wound on the foot and she is in a total contact cast. I do think this is appropriate this is the first cast change which we are obliged to do to ensure nothing is rubbing everything appears to be doing quite well and very pleased in that regard. 11/7; patient presents for follow-up. She had no issues  with the cast. She denies signs of infection. 11/14; patient presents for 1 week follow-up. She has had no issues with the cast. She denies signs of infection. 11/21; patient presents for 1 week follow-up. She did develop 2 small areas of skin breakdown on either side of the right foot from the cast rubbing. She states she did not feel the wounds developing. She currently denies signs of infection. 11/28; patient presents for 1 week follow-up. She has no issues or complaints today. She denies pain or acute signs of infection. 12/12; patient presents for follow-up. She reports improvement to her right lateral foot wound. She has been using silver alginate to the area. She has no issues or complaints today. 12/19; patient presents for follow-up. She reports that her right lateral foot wound has healed. She has no issues or complaints today. 1/3; patient presents for follow-up. She has no issues or complaints today. She reports no open wounds. Readmission 10/01/2021 [name] is a 66 year old female's with a past medical history of insulin-dependent controlled type 2 diabetes complicated by peripheral neuropathy that presents to the clinic for a 8-monthhistory of nonhealing ulcer to the right foot. I have seen her before for a wound to the same area that was treated and healed. Her wound today however this is much deeper and it has thick yellow drainage. She is not sure exactly how it started. She noticed it 1 day. She has been following with Dr. EAmalia Haileyfor this issue. She had a wound culture that showed a mix of organisms on  09/01/2021. She was recently started on doxycycline. She is using the dConservation officer, nature She has been using Iodosorb with dressing changes. She currently denies systemic signs of infection. 8/21; patient presents for follow-up. She states she started and completed levofloxacin. She is still taking doxycycline. She reports improvement in drainage and odor. She has been doing Dakin's wet-to-dry dressings as well. She is using her front offloading shoe. She denies signs of infection. 8/29; patient presents for follow-up. She is still taking doxycycline. She continues to report improvement in drainage and reports no odor. She denies any purulent drainage. She has been doing silver alginate to the wound bed and using her front offloading shoe. Electronic Signature(s) Signed: 10/19/2021 12:41:14 PM By: HKalman ShanDO Entered By: HKalman Shanon 10/19/2021 11:41:27 -------------------------------------------------------------------------------- Physical Exam Details Patient Name: Date of Service: [name] RD, [name] 10/19/2021 9:15 A M Medical Record Number: [id number] Account Number: [id number] of Birth/Sex: Treating RN: [birth year]((66y.o. FTonita Phoenix [name] Primary Care Provider: AGeoffery LyonsOther Clinician: Referring Provider: Treating Provider/Extender: HBaird Kayin Treatment: 2 Constitutional respirations regular, non-labored and within target range for patient.. Cardiovascular 2+ dorsalis pedis/posterior tibialis pulses. Psychiatric pleasant and cooperative. Notes Right foot: T these first submetatarsal there is an open wound there was circumferential undermining. There is granulation tissue and nonviable tissue present. o No probing to bone. Electronic Signature(s) Signed: 10/19/2021 12:41:14 PM By: HKalman ShanDO Entered By: HKalman Shanon 10/19/2021  11:42:14 -------------------------------------------------------------------------------- Physician Orders Details Patient Name: Date of Service: [name] RD, [name] 10/19/2021 9:15 A M Medical Record Number: [id number] Account Number: [id number] of Birth/Sex: Treating RN: [birth year]((66y.o. FTonita Phoenix [name] Primary Care Provider: AGeoffery LyonsOther Clinician: Referring Provider: Treating Provider/Extender: HBaird Kayin Treatment: 2 Verbal / Phone Orders: No Diagnosis Coding Follow-up Appointments ppointment in 1 week. - Tuesday  and Thursday w/ Dr. Heber [place] and Elias Else # 9 Return A Bathing/ Shower/ Hygiene May shower with protection but do not get wound dressing(s) wet. Edema Control - Lymphedema / SCD / Other Elevate legs to the level of the heart or above for 30 minutes daily and/or when sitting, a frequency of: Avoid standing for long periods of time. Off-Loading Open toe surgical shoe to: - front offloader to right foot Wound Treatment Wound #4 - Metatarsal head first Wound Laterality: Plantar, Right Cleanser: Soap and Water 2 x Per Day/15 Days Discharge Instructions: May shower and wash wound with dial antibacterial soap and water prior to dressing change. Cleanser: Wound Cleanser (Generic) 2 x Per Day/15 Days Discharge Instructions: Cleanse the wound with wound cleanser prior to applying a clean dressing using gauze sponges, not tissue or cotton balls. Prim Dressing: KerraCel Ag Gelling Fiber Dressing, 4x5 in (silver alginate) (Generic) 2 x Per Day/15 Days ary Discharge Instructions: Apply silver alginate to wound bed as instructed Secondary Dressing: Optifoam Non-Adhesive Dressing, 4x4 in (Generic) 2 x Per Day/15 Days Discharge Instructions: Apply a foam donut over primary dressing as directed. Secondary Dressing: Woven Gauze Sponge, Non-Sterile 4x4 in (Generic) 2 x Per Day/15 Days Discharge Instructions: Apply  over primary dressing as directed. Secured With: [employer], 4.5x3.1 (in/yd) (Generic) 2 x Per Day/15 Days Discharge Instructions: Secure with Kerlix as directed. Secured With: 25M Medipore H Soft Cloth Surgical T ape, 4 x 10 (in/yd) (Generic) 2 x Per Day/15 Days Discharge Instructions: Secure with tape as directed. Secured With: Stretch Net Size 5, 10 (yds) (Generic) 2 x Per Day/15 Days Patient Medications llergies: Documentation for allergies has not been submitted A Notifications Medication Indication Start End 10/19/2021 levofloxacin DOSE 1 - oral 500 mg tablet - 1 tablet oral daily x 7 days Electronic Signature(s) Signed: 10/19/2021 12:41:14 PM By: Kalman Shan DO Previous Signature: 10/19/2021 10:22:24 AM Version By: Kalman Shan DO Entered By: Kalman Shan on 10/19/2021 11:42:24 -------------------------------------------------------------------------------- Problem List Details Patient Name: Date of Service: [name] RD, [name] 10/19/2021 9:15 A M Medical Record Number: [id number] Patient Account Number: [id number] Date of Birth/Sex: Treating RN: 1955-09-03 (66 y.o. [name], [name] Primary Care Provider: [name] Other Clinician: Referring Provider: Treating Provider/Extender: [name] in Treatment: 2 Active Problems ICD-10 Encounter Code Description Active Date MDM Diagnosis L97.512 Non-pressure 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 10/01/2021 No Yes E11.621 Type 2 diabetes mellitus with foot ulcer 10/01/2021 No Yes E11.42 Type 2 diabetes mellitus with diabetic polyneuropathy 10/01/2021 No Yes Inactive Problems Resolved Problems Electronic Signature(s) Signed: 10/19/2021 12:41:14 PM By: Kalman Shan DO Entered By: Kalman Shan on 10/19/2021 11:39:56 -------------------------------------------------------------------------------- Progress Note Details Patient Name:  Date of Service: [place], [name] 10/19/2021 9:15 A M Medical Record Number: [id number] Patient Account Number: [id number] Date of Birth/Sex: Treating RN: 1955/12/04 (66 y.o. [name], [name] Primary Care Provider: [name] Other Clinician: Referring Provider: Treating Provider/Extender: [name] in Treatment: 2 Subjective Chief Complaint Information obtained from Patient 10/27; Right Plantar foot wound 11/21; 2 small areas of skin breakdown to the right foot following cast placement 8/29; right plantar foot wound History of Present Illness (HPI) Admission 12/17/2020 Ms. Rhina Kramme is a 66 year old female with a past medical history of insulin-dependent type 2 diabetes, hypothyroidism and daily1 pack per day cigarette smoker the presents to the clinic for a 6-week history of  nonhealing wound to the right first MTPJ. She has been following with [name], podiatry for this issue. She has been using silver alginate with dressing changes. She uses a postsurgical shoe and offloading pads. She currently denies signs of infection. 10/31; patient presents for follow-up. She tolerated the soft cast fine although she states that she felt her foot rolling to one side. She denies signs of infection. She would like to do the total contact cast today. 12/23/2020 upon evaluation today patient appears to be doing excellent in regard to her wound on the foot and she is in a total contact cast. I do think this is appropriate this is the first cast change which we are obliged to do to ensure nothing is rubbing everything appears to be doing quite well and very pleased in that regard. 11/7; patient presents for follow-up. She had no issues with the cast. She denies signs of infection. 11/14; patient presents for 1 week follow-up. She has had no issues with the cast. She denies signs of infection. 11/21; patient presents for 1 week follow-up. She did  develop 2 small areas of skin breakdown on either side of the right foot from the cast rubbing. She states she did not feel the wounds developing. She currently denies signs of infection. 11/28; patient presents for 1 week follow-up. She has no issues or complaints today. She denies pain or acute signs of infection. 12/12; patient presents for follow-up. She reports improvement to her right lateral foot wound. She has been using silver alginate to the area. She has no issues or complaints today. 12/19; patient presents for follow-up. She reports that her right lateral foot wound has healed. She has no issues or complaints today. 1/3; patient presents for follow-up. She has no issues or complaints today. She reports no open wounds. Readmission 10/01/2021 Ms. Vernice Mannina is a 66 year old female's with a past medical history of insulin-dependent controlled type 2 diabetes complicated by peripheral neuropathy that presents to the clinic for a 67-monthhistory of nonhealing ulcer to the right foot. I have seen her before for a wound to the same area that was treated and healed. Her wound today however this is much deeper and it has thick yellow drainage. She is not sure exactly how it started. She noticed it 1 day. She has been following with Dr. EAmalia Haileyfor this issue. She had a wound culture that showed a mix of organisms on 09/01/2021. She was recently started on doxycycline. She is using the dConservation officer, nature She has been using Iodosorb with dressing changes. She currently denies systemic signs of infection. 8/21; patient presents for follow-up. She states she started and completed levofloxacin. She is still taking doxycycline. She reports improvement in drainage and odor. She has been doing Dakin's wet-to-dry dressings as well. She is using her front offloading shoe. She denies signs of infection. 8/29; patient presents for follow-up. She is still taking doxycycline. She continues to report improvement in  drainage and reports no odor. She denies any purulent drainage. She has been doing silver alginate to the wound bed and using her front offloading shoe. Patient History Information obtained from Patient, Chart. Family History Unknown History. Social History Current every day smoker - 1 pack/a/day, Alcohol Use - Never, Drug Use - No History, Caffeine Use - Moderate. Medical History Eyes Denies history of Cataracts, Glaucoma, Optic Neuritis Ear/Nose/Mouth/Throat Denies history of Chronic sinus problems/congestion, Middle ear problems Hematologic/Lymphatic Denies history of Anemia, Hemophilia, Human Immunodeficiency Virus, Lymphedema, Sickle Cell Disease  Respiratory Patient has history of Sleep Apnea Denies history of Aspiration, Asthma, Chronic Obstructive Pulmonary Disease (COPD), Pneumothorax, Tuberculosis Cardiovascular Patient has history of Coronary Artery Disease, Hypertension Denies history of Angina, Arrhythmia, Congestive Heart Failure, Deep Vein Thrombosis, Hypotension, Myocardial Infarction, Peripheral Arterial Disease, Peripheral Venous Disease, Phlebitis, Vasculitis Gastrointestinal Denies history of Cirrhosis , Colitis, Crohnoos, Hepatitis A, Hepatitis B, Hepatitis C Endocrine Patient has history of Type II Diabetes Denies history of Type I Diabetes Genitourinary Denies history of End Stage Renal Disease Immunological Denies history of Lupus Erythematosus, Raynaudoos, Scleroderma Integumentary (Skin) Denies history of History of Burn Musculoskeletal Denies history of Gout, Rheumatoid Arthritis, Osteoarthritis, Osteomyelitis Neurologic Patient has history of Neuropathy Denies history of Dementia, Quadriplegia, Paraplegia, Seizure Disorder Hospitalization/Surgery History - 2nd and 3rd right toe amps. - total hysterectomy. - cholecystectomy. - 2 knee surgery's on left and 1 on right. Medical A Surgical History  Notes nd Cardiovascular hypercholesterolemia Objective Constitutional respirations regular, non-labored and within target range for patient.. Vitals Time Taken: 9:24 AM, Temperature: 98.7 F, Pulse: 74 bpm, Respiratory Rate: 17 breaths/min, Blood Pressure: 146/76 mmHg. Cardiovascular 2+ dorsalis pedis/posterior tibialis pulses. Psychiatric pleasant and cooperative. General Notes: Right foot: T these first submetatarsal there is an open wound there was circumferential undermining. There is granulation tissue and nonviable o tissue present. No probing to bone. Integumentary (Hair, Skin) Wound #4 status is Open. Original cause of wound was Gradually Appeared. The date acquired was: 07/22/2021. The wound has been in treatment 2 weeks. The wound is located on the Right,Plantar Metatarsal head first. The wound measures 1.5cm length x 2.5cm width x 1.7cm depth; 2.945cm^2 area and 5.007cm^3 volume. There is Fat Layer (Subcutaneous Tissue) exposed. Tunneling has been noted at 3:00 with a maximum distance of 2cm. Undermining begins at 12:00 and ends at 12:00 with a maximum distance of 2cm. There is a large amount of purulent drainage noted. The wound margin is distinct with the outline attached to the wound base. There is medium (34-66%) red, pink granulation within the wound bed. There is a medium (34-66%) amount of necrotic tissue within the wound bed including Adherent Slough. Assessment Active Problems ICD-10 Non-pressure 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 Type 2 diabetes mellitus with foot ulcer Type 2 diabetes mellitus with diabetic polyneuropathy Patient's wound is larger today. I debrided nonviable tissue. There was some trapped drainage under the callus. Due to this I would like to put her on 1 more week of levofloxacin as she is high risk for infection and thus amputation. She can finish out doxycycline as well. Next week I will place her in a cast.  Since this is on the right foot she will need to have a driver. Procedures Wound #4 Pre-procedure diagnosis of Wound #4 is a Diabetic Wound/Ulcer of the Lower Extremity located on the Right,Plantar Metatarsal head first .Severity of Tissue Pre Debridement is: Fat layer exposed. There was a Excisional Skin/Subcutaneous Tissue Debridement with a total area of 3.75 sq cm performed by Kalman Shan, DO. With the following instrument(s): Curette to remove Viable and Non-Viable tissue/material. Material removed includes Callus, Subcutaneous Tissue, and Slough after achieving pain control using Lidocaine. No specimens were taken. A time out was conducted at 10:02, prior to the start of the procedure. A Minimum amount of bleeding was controlled with Pressure. The procedure was tolerated well with a pain level of 0 throughout and a pain level of 0 following the procedure. Post Debridement Measurements: 1.5cm length x 2.5cm  width x 1.7cm depth; 5.007cm^3 volume. Character of Wound/Ulcer Post Debridement is improved. Severity of Tissue Post Debridement is: Fat layer exposed. Post procedure Diagnosis Wound #4: Same as Pre-Procedure Plan Follow-up Appointments: Return Appointment in 1 week. - Tuesday and Thursday w/ Dr. Heber [place] and Elias Else # 9 Bathing/ Shower/ Hygiene: May shower with protection but do not get wound dressing(s) wet. Edema Control - Lymphedema / SCD / Other: Elevate legs to the level of the heart or above for 30 minutes daily and/or when sitting, a frequency of: Avoid standing for long periods of time. Off-Loading: Open toe surgical shoe to: - front offloader to right foot The following medication(s) was prescribed: levofloxacin oral 500 mg tablet 1 1 tablet oral daily x 7 days starting 10/19/2021 WOUND #4: - Metatarsal head first Wound Laterality: Plantar, Right Cleanser: Soap and Water 2 x Per Day/15 Days Discharge Instructions: May shower and wash wound with dial antibacterial soap  and water prior to dressing change. Cleanser: Wound Cleanser (Generic) 2 x Per Day/15 Days Discharge Instructions: Cleanse the wound with wound cleanser prior to applying a clean dressing using gauze sponges, not tissue or cotton balls. Prim Dressing: KerraCel Ag Gelling Fiber Dressing, 4x5 in (silver alginate) (Generic) 2 x Per Day/15 Days ary Discharge Instructions: Apply silver alginate to wound bed as instructed Secondary Dressing: Optifoam Non-Adhesive Dressing, 4x4 in (Generic) 2 x Per Day/15 Days Discharge Instructions: Apply a foam donut over primary dressing as directed. Secondary Dressing: Woven Gauze Sponge, Non-Sterile 4x4 in (Generic) 2 x Per Day/15 Days Discharge Instructions: Apply over primary dressing as directed. Secured With: [employer], 4.5x3.1 (in/yd) (Generic) 2 x Per Day/15 Days Discharge Instructions: Secure with Kerlix as directed. Secured With: 53M Medipore H Soft Cloth Surgical T ape, 4 x 10 (in/yd) (Generic) 2 x Per Day/15 Days Discharge Instructions: Secure with tape as directed. Secured With: Stretch Net Size 5, 10 (yds) (Generic) 2 x Per Day/15 Days 1. In office sharp debridement 2. Silver alginate 3. Aggressive offloadingoofront offloading shoe 4. Follow-up in 1 week Electronic Signature(s) Signed: 10/19/2021 12:41:14 PM By: Kalman Shan DO Entered By: Kalman Shan on 10/19/2021 11:44:55 -------------------------------------------------------------------------------- HxROS Details Patient Name: Date of Service: [name] RD, [name] 10/19/2021 9:15 A M Medical Record Number: [id number] Patient Account Number: [id number] Date of Birth/Sex: Treating RN: [birth year] (66 y.o. [name], [name] Primary Care Provider: [name] Other Clinician: Referring Provider: Treating Provider/Extender: [name] in Treatment: 2 Information Obtained From Patient Chart Eyes Medical History: Negative for:  Cataracts; Glaucoma; Optic Neuritis Ear/Nose/Mouth/Throat Medical History: Negative for: Chronic sinus problems/congestion; Middle ear problems Hematologic/Lymphatic Medical History: Negative for: Anemia; Hemophilia; Human Immunodeficiency Virus; Lymphedema; Sickle Cell Disease Respiratory Medical History: Positive for: Sleep Apnea Negative for: Aspiration; Asthma; Chronic Obstructive Pulmonary Disease (COPD); Pneumothorax; Tuberculosis Cardiovascular Medical History: Positive for: Coronary Artery Disease; Hypertension Negative for: Angina; Arrhythmia; Congestive Heart Failure; Deep Vein Thrombosis; Hypotension; Myocardial Infarction; Peripheral Arterial Disease; Peripheral Venous Disease; Phlebitis; Vasculitis Past Medical History Notes: hypercholesterolemia Gastrointestinal Medical History: Negative for: Cirrhosis ; Colitis; Crohns; Hepatitis A; Hepatitis B; Hepatitis C Endocrine Medical History: Positive for: Type II Diabetes Negative for: Type I Diabetes Genitourinary Medical History: Negative for: End Stage Renal Disease Immunological Medical History: Negative for: Lupus Erythematosus; Raynauds; Scleroderma Integumentary (Skin) Medical History: Negative for: History of Burn Musculoskeletal Medical History: Negative for: Gout; Rheumatoid Arthritis; Osteoarthritis; Osteomyelitis Neurologic Medical History: Positive for: Neuropathy Negative for: Dementia; Quadriplegia; Paraplegia; Seizure Disorder Immunizations Pneumococcal  Vaccine: Received Pneumococcal Vaccination: Yes Received Pneumococcal Vaccination On or After 60th Birthday: Yes Tetanus Vaccine: Last tetanus shot: 12/17/2020 Implantable Devices No devices added Hospitalization / Surgery History Type of Hospitalization/Surgery 2nd and 3rd right toe amps total hysterectomy cholecystectomy 2 knee surgery's on left and 1 on right Family and Social History Unknown History: Yes; Current every day smoker - 1  pack/a/day; Alcohol Use: Never; Drug Use: No History; Caffeine Use: Moderate; Financial Concerns: No; Food, Clothing or Shelter Needs: No; Support System Lacking: No; Transportation Concerns: No Electronic Signature(s) Signed: 10/19/2021 12:41:14 PM By: Kalman Shan DO Signed: 10/22/2021 1:10:23 PM By: Rhae Hammock RN Entered By: Kalman Shan on 10/19/2021 11:41:34 -------------------------------------------------------------------------------- SuperBill Details Patient Name: Date of Service: [name] RD, [name] 10/19/2021 Medical Record Number: [id number] Patient Account Number: [id number] Date of Birth/Sex: Treating RN: Jun 18, 1955 (66 y.o. [name], [name] Primary Care Provider: [name] Other Clinician: Referring Provider: Treating Provider/Extender: [name] in Treatment: 2 Diagnosis Coding ICD-10 Codes Code Description [phone] Non-pressure 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 E11.621 Type 2 diabetes mellitus with foot ulcer E11.42 Type 2 diabetes mellitus with diabetic polyneuropathy Facility Procedures CPT4 Code: [id number] Description: 07121 - DEB SUBQ TISSUE 20 SQ CM/< ICD-10 Diagnosis Description L97.512 Non-pressure 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 Modifier: Quantity: 1 Physician Procedures : CPT4 Code Description Modifier [id number] - WC PHYS SUBQ TISS 20 SQ CM ICD-10 Diagnosis Description L97.512 Non-pressure 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 Quantity: 1 Electronic Signature(s) Signed: 10/19/2021 12:41:14 PM By: Kalman Shan DO Entered By: Kalman Shan on 10/19/2021 11:45:05

## 2021-10-22 NOTE — Progress Notes (Signed)
CHYENNE, SOBCZAK ([id number]) Visit Report for 10/19/2021 Arrival Information Details Patient Name: Date of Service: [name] [name] 10/19/2021 9:15 A M Medical Record Number: [id number] Patient Account Number: [id number] Date of Birth/Sex: Treating RN: January 23, 1956 (66 y.o. [name], [name] Primary Care Deveion Denz: [name] Other Clinician: Referring Ieisha Gao: Treating Burlin Mcnair/Extender: [name] in Treatment: 2 Visit Information History Since Last Visit Added or deleted any medications: No Patient Arrived: Ambulatory Any new allergies or adverse reactions: No Arrival Time: 09:16 Had a fall or experienced change in No Accompanied By: self activities of daily living that may affect Transfer Assistance: None risk of falls: Patient Identification Verified: Yes Signs or symptoms of abuse/neglect since last visito No Secondary Verification Process Completed: Yes Hospitalized since last visit: No Patient Requires Transmission-Based Precautions: No Implantable device outside of the clinic excluding No Patient Has Alerts: No cellular tissue based products placed in the center since last visit: Has Dressing in Place as Prescribed: Yes Pain Present Now: No Electronic Signature(s) Signed: 10/19/2021 5:09:16 PM By: Deon Pilling RN, BSN Entered By: Deon Pilling on 10/19/2021 09:19:55 -------------------------------------------------------------------------------- Encounter Discharge Information Details Patient Name: Date of Service: [name], [name] 10/19/2021 9:15 A M Medical Record Number: [id number] Patient Account Number: [id number] Date of Birth/Sex: Treating RN: 18-Sep-1955 (66 y.o. [name], [name] Primary Care Nyisha Clippard: [name] Other Clinician: Referring Reka Wist: Treating Laquana Villari/Extender: [name] in Treatment: 2 Encounter Discharge Information Items Post Procedure  Vitals Discharge Condition: Stable Temperature (F): 98.7 Ambulatory Status: Ambulatory Pulse (bpm): 74 Discharge Destination: Home Respiratory Rate (breaths/min): 17 Transportation: Private Auto Blood Pressure (mmHg): 134/74 Accompanied By: self Schedule Follow-up Appointment: Yes Clinical Summary of Care: Patient Declined Electronic Signature(s) Signed: 10/22/2021 1:10:23 PM By: Rhae Hammock RN Entered By: Rhae Hammock on 10/19/2021 10:24:56 -------------------------------------------------------------------------------- Lower Extremity Assessment Details Patient Name: Date of Service: [name], [name] 10/19/2021 9:15 A M Medical Record Number: [id number] Patient Account Number: [id number] Date of Birth/Sex: Treating RN: 07-23-1955 (66 y.o. [name], [name] Primary Care Ciena Sampley: [name] Other Clinician: Referring Cyla Haluska: Treating Virgel Haro/Extender: [name] in Treatment: 2 Edema Assessment Assessed: [Left: No] [Right: Yes] Edema: [Left: Ye] [Right: s] Calf Left: Right: Point of Measurement: From Medial Instep 47 cm Ankle Left: Right: Point of Measurement: From Medial Instep 26.7 cm Vascular Assessment Pulses: Dorsalis Pedis Palpable: [Right:Yes] Posterior Tibial Palpable: [Right:Yes] Electronic Signature(s) Signed: 10/19/2021 5:09:16 PM By: Deon Pilling RN, BSN Entered By: Deon Pilling on 10/19/2021 09:25:04 -------------------------------------------------------------------------------- Multi Wound Chart Details Patient Name: Date of Service: [name], [name] 10/19/2021 9:15 A M Medical Record Number: [id number] Patient Account Number: [id number] Date of Birth/Sex: Treating RN: February 02, 1956 (66 y.o. [name], [name] Primary Care Trenell Concannon: [name] Other Clinician: Referring Cameo Schmiesing: Treating Datra Clary/Extender: [name] in Treatment: 2 Vital  Signs Height(in): Pulse(bpm): 52 Weight(lbs): Blood Pressure(mmHg): 146/76 Body Mass Index(BMI): Temperature(F): 98.7 Respiratory Rate(breaths/min): 17 Photos: [4:Right, Plantar Metatarsal head first] [N/A:N/A N/A] Wound Location: [4:Gradually Appeared] [N/A:N/A] Wounding Event: [4:Diabetic Wound/Ulcer of the Lower] [N/A:N/A] Primary Etiology: [4:Extremity Sleep Apnea, Coronary Artery] [N/A:N/A] Comorbid History: [4:Disease, Hypertension, Type II Diabetes, Neuropathy 07/22/2021] [N/A:N/A] Date Acquired: [4:2] [N/A:N/A] Weeks of Treatment: [4:Open] [N/A:N/A] Wound Status: [4:No] [N/A:N/A] Wound Recurrence: [4:Yes] [N/A:N/A] Pending A mputation on Presentation: [4:1.5x2.5x1.7] [N/A:N/A] Measurements L x W x D (cm) [4:2.945] [N/A:N/A] A (cm) : rea [4:5.007] [N/A:N/A] Volume (cm) : [  4:-4047.90%] [N/A:N/A] % Reduction in A rea: [4:-3109.60%] [N/A:N/A] % Reduction in Volume: [4:3] Position 1 (o'clock): [4:2] Maximum Distance 1 (cm): [4:12] Starting Position 1 (o'clock): [4:12] Ending Position 1 (o'clock): [4:2] Maximum Distance 1 (cm): [4:Yes] [N/A:N/A] Tunneling: [4:Yes] [N/A:N/A] Undermining: [4:Grade 2] [N/A:N/A] Classification: [4:Large] [N/A:N/A] Exudate A mount: [4:Purulent] [N/A:N/A] Exudate Type: [4:yellow, brown, green] [N/A:N/A] Exudate Color: [4:Distinct, outline attached] [N/A:N/A] Wound Margin: [4:Medium (34-66%)] [N/A:N/A] Granulation A mount: [4:Red, Pink] [N/A:N/A] Granulation Quality: [4:Medium (34-66%)] [N/A:N/A] Necrotic A mount: [4:Fat Layer (Subcutaneous Tissue): Yes N/A] Exposed Structures: [4:Fascia: No Tendon: No Muscle: No Joint: No Bone: No None] [N/A:N/A] Epithelialization: [4:Debridement - Excisional] [N/A:N/A] Debridement: Pre-procedure Verification/Time Out 10:02 [N/A:N/A] Taken: [4:Lidocaine] [N/A:N/A] Pain Control: [4:Callus, Subcutaneous, Slough] [N/A:N/A] Tissue Debrided: [4:Skin/Subcutaneous Tissue] [N/A:N/A] Level: [4:3.75]  [N/A:N/A] Debridement A (sq cm): [4:rea Curette] [N/A:N/A] Instrument: [4:Minimum] [N/A:N/A] Bleeding: [4:Pressure] [N/A:N/A] Hemostasis A chieved: [4:0] [N/A:N/A] Procedural Pain: [4:0] [N/A:N/A] Post Procedural Pain: [4:Procedure was tolerated well] [N/A:N/A] Debridement Treatment Response: [4:1.5x2.5x1.7] [N/A:N/A] Post Debridement Measurements L x W x D (cm) [4:5.007] [N/A:N/A] Post Debridement Volume: (cm) [4:Debridement] [N/A:N/A] Treatment Notes Wound #4 (Metatarsal head first) Wound Laterality: Plantar, Right Cleanser Soap and Water Discharge Instruction: May shower and wash wound with dial antibacterial soap and water prior to dressing change. Wound Cleanser Discharge Instruction: Cleanse the wound with wound cleanser prior to applying a clean dressing using gauze sponges, not tissue or cotton balls. Peri-Wound Care Topical Primary Dressing KerraCel Ag Gelling Fiber Dressing, 4x5 in (silver alginate) Discharge Instruction: Apply silver alginate to wound bed as instructed Secondary Dressing Optifoam Non-Adhesive Dressing, 4x4 in Discharge Instruction: Apply a foam donut over primary dressing as directed. Woven Gauze Sponge, Non-Sterile 4x4 in Discharge Instruction: Apply over primary dressing as directed. Secured With [employer], 4.5x3.1 (in/yd) Discharge Instruction: Secure with Kerlix as directed. 75M Medipore H Soft Cloth Surgical T ape, 4 x 10 (in/yd) Discharge Instruction: Secure with tape as directed. Stretch Net Size 5, 10 (yds) Compression Wrap Compression Stockings Add-Ons Electronic Signature(s) Signed: 10/19/2021 12:41:14 PM By: Kalman Shan DO Signed: 10/22/2021 1:10:23 PM By: Rhae Hammock RN Entered By: Kalman Shan on 10/19/2021 11:40:02 -------------------------------------------------------------------------------- Multi-Disciplinary Care Plan Details Patient Name: Date of Service: [name], [name] 10/19/2021 9:15 A  M Medical Record Number: [id number] Patient Account Number: [id number] Date of Birth/Sex: Treating RN: 09-Dec-1955 (66 y.o. [name], [name] Primary Care Delfino Friesen: [name] Other Clinician: Referring Jasilyn Holderman: Treating Jerelyn Trimarco/Extender: [name] in Treatment: 2 Active Inactive Wound/Skin Impairment Nursing Diagnoses: Impaired tissue integrity Knowledge deficit related to ulceration/compromised skin integrity Goals: Patient will have a decrease in wound volume by X% from date: (specify in notes) Date Initiated: 10/01/2021 Target Resolution Date: 10/29/2021 Goal Status: Active Patient/caregiver will verbalize understanding of skin care regimen Date Initiated: 10/01/2021 Target Resolution Date: 10/25/2021 Goal Status: Active Ulcer/skin breakdown will have a volume reduction of 30% by week 4 Date Initiated: 10/01/2021 Target Resolution Date: 10/28/2021 Goal Status: Active Interventions: Assess patient/caregiver ability to obtain necessary supplies Assess patient/caregiver ability to perform ulcer/skin care regimen upon admission and as needed Assess ulceration(s) every visit Notes: Electronic Signature(s) Signed: 10/22/2021 1:10:23 PM By: Rhae Hammock RN Entered By: Rhae Hammock on 10/19/2021 09:53:43 -------------------------------------------------------------------------------- Pain Assessment Details Patient Name: Date of Service: [place], [name] 10/19/2021 9:15 A M Medical Record Number: [id number] Patient Account Number: [id number] Date of Birth/Sex: Treating RN: [birth year] (66 y.o. [name], [name] Primary Care Nancie Bocanegra: [name] Other Clinician: Referring  Chaquana Nichols: Treating Reda Citron/Extender: [name] in Treatment: 2 Active Problems Location of Pain Severity and Description of Pain Patient Has Paino No Site Locations Pain Management and Medication Current Pain  Management: Electronic Signature(s) Signed: 10/19/2021 5:09:16 PM By: Deon Pilling RN, BSN Signed: 10/22/2021 1:10:23 PM By: Rhae Hammock RN Entered By: Deon Pilling on 10/19/2021 09:24:57 -------------------------------------------------------------------------------- Patient/Caregiver Education Details Patient Name: Date of Service: [name], [name] 8/29/2023andnbsp9:15 A M Medical Record Number: [id number] Patient Account Number: [id number] Date of Birth/Gender: Treating RN: 04-10-1955 (66 y.o. [name], [name] Primary Care Physician: [name] Other Clinician: Referring Physician: Treating Physician/Extender: [name] in Treatment: 2 Education Assessment Education Provided To: Patient Education Topics Provided Wound/Skin Impairment: Methods: Explain/Verbal Responses: Reinforcements needed, State content correctly Electronic Signature(s) Signed: 10/22/2021 1:10:23 PM By: Rhae Hammock RN Entered By: Rhae Hammock on 10/19/2021 09:54:03 -------------------------------------------------------------------------------- Wound Assessment Details Patient Name: Date of Service: [name], [name] 10/19/2021 9:15 A M Medical Record Number: [id number] Patient Account Number: [id number] Date of Birth/Sex: Treating RN: February 08, 1956 (66 y.o. [name], [name] Primary Care Dredyn Gubbels: [name] Other Clinician: Referring Nikola Blackston: Treating Juanna Pudlo/Extender: [name] in Treatment: 2 Wound Status Wound Number: 4 Primary Diabetic Wound/Ulcer of the Lower Extremity Etiology: Wound Location: Right, Plantar Metatarsal head first Wound Open Wounding Event: Gradually Appeared Status: Date Acquired: 07/22/2021 Comorbid Sleep Apnea, Coronary Artery Disease, Hypertension, Type II Weeks Of Treatment: 2 History: Diabetes, Neuropathy Clustered Wound: No Pending Amputation On  Presentation Photos Wound Measurements Length: (cm) 1.5 Width: (cm) 2.5 Depth: (cm) 1.7 Area: (cm) 2.945 Volume: (cm) 5.007 % Reduction in Area: -4047.9% % Reduction in Volume: -3109.6% Epithelialization: None Tunneling: Yes Position (o'clock): 3 Maximum Distance: (cm) 2 Undermining: Yes Starting Position (o'clock): 12 Ending Position (o'clock): 12 Maximum Distance: (cm) 2 Wound Description Classification: Grade 2 Wound Margin: Distinct, outline attached Exudate Amount: Large Exudate Type: Purulent Exudate Color: yellow, brown, green Foul Odor After Cleansing: No Slough/Fibrino Yes Wound Bed Granulation Amount: Medium (34-66%) Exposed Structure Granulation Quality: Red, Pink Fascia Exposed: No Necrotic Amount: Medium (34-66%) Fat Layer (Subcutaneous Tissue) Exposed: Yes Necrotic Quality: Adherent Slough Tendon Exposed: No Muscle Exposed: No Joint Exposed: No Bone Exposed: No Treatment Notes Wound #4 (Metatarsal head first) Wound Laterality: Plantar, Right Cleanser Soap and Water Discharge Instruction: May shower and wash wound with dial antibacterial soap and water prior to dressing change. Wound Cleanser Discharge Instruction: Cleanse the wound with wound cleanser prior to applying a clean dressing using gauze sponges, not tissue or cotton balls. Peri-Wound Care Topical Primary Dressing KerraCel Ag Gelling Fiber Dressing, 4x5 in (silver alginate) Discharge Instruction: Apply silver alginate to wound bed as instructed Secondary Dressing Optifoam Non-Adhesive Dressing, 4x4 in Discharge Instruction: Apply a foam donut over primary dressing as directed. Woven Gauze Sponge, Non-Sterile 4x4 in Discharge Instruction: Apply over primary dressing as directed. Secured With [employer], 4.5x3.1 (in/yd) Discharge Instruction: Secure with Kerlix as directed. 67M Medipore H Soft Cloth Surgical T ape, 4 x 10 (in/yd) Discharge Instruction: Secure with tape as  directed. Stretch Net Size 5, 10 (yds) Compression Wrap Compression Stockings Add-Ons Electronic Signature(s) Signed: 10/19/2021 5:09:16 PM By: Deon Pilling RN, BSN Signed: 10/22/2021 1:10:23 PM By: Rhae Hammock RN Entered By: Rhae Hammock on 10/19/2021 09:27:58 -------------------------------------------------------------------------------- Vitals Details Patient Name: Date of Service: [place], [name] 10/19/2021 9:15 A M Medical Record Number: [id number] Patient Account Number: [id number]  Date of Birth/Sex: Treating RN: 1955-06-22 (66 y.o. [name] Primary Care Illyanna Petillo: [name] Other Clinician: Referring Harace Mccluney: Treating Jatavius Ellenwood/Extender: [name] in Treatment: 2 Vital Signs Time Taken: 09:24 Temperature (F): 98.7 Pulse (bpm): 74 Respiratory Rate (breaths/min): 17 Blood Pressure (mmHg): 146/76 Reference Range: 80 - 120 mg / dl Electronic Signature(s) Signed: 10/19/2021 5:09:16 PM By: Deon Pilling RN, BSN Entered By: Deon Pilling on 10/19/2021 09:24:53

## 2021-10-26 ENCOUNTER — Encounter (HOSPITAL_BASED_OUTPATIENT_CLINIC_OR_DEPARTMENT_OTHER): Payer: Medicare Other | Attending: Internal Medicine | Admitting: Internal Medicine

## 2021-10-26 DIAGNOSIS — E11621 Type 2 diabetes mellitus with foot ulcer: Secondary | ICD-10-CM | POA: Diagnosis not present

## 2021-10-26 DIAGNOSIS — E1142 Type 2 diabetes mellitus with diabetic polyneuropathy: Secondary | ICD-10-CM | POA: Diagnosis not present

## 2021-10-26 DIAGNOSIS — L97512 Non-pressure 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: Secondary | ICD-10-CM | POA: Insufficient documentation

## 2021-10-26 NOTE — Progress Notes (Signed)
[name] ([id number]) Visit Report for 10/26/2021 Arrival Information Details Patient Name: Date of Service: [name], [name] 10/26/2021 10:00 [facility] Record Number: [id number] Patient Account Number: [id number] Date of Birth/Sex: Treating RN: 11/17/1955 (66 y.o. [name], [name] Primary Care Dailey Buccheri: [name] Other Clinician: Referring Kaylum Shrum: Treating Afsa Meany/Extender: [name] in Treatment: 3 Visit Information History Since Last Visit Added or deleted any medications: No Patient Arrived: Ambulatory Any new allergies or adverse reactions: No Arrival Time: 10:08 Had [name] fall or experienced change in No Accompanied By: Husband activities of daily living that may affect Transfer Assistance: None risk of falls: Patient Identification Verified: Yes Signs or symptoms of abuse/neglect since last visito No Secondary Verification Process Completed: Yes Hospitalized since last visit: No Patient Requires Transmission-Based Precautions: No Implantable device outside of the clinic excluding No Patient Has Alerts: No cellular tissue based products placed in the center since last visit: Has Dressing in Place as Prescribed: Yes Pain Present Now: No Electronic Signature(s) Signed: 10/26/2021 4:25:50 PM By: Rhae Hammock RN Entered By: Rhae Hammock on 10/26/2021 10:08:23 -------------------------------------------------------------------------------- Encounter Discharge Information Details Patient Name: Date of Service: [name], [name] 10/26/2021 10:00 [name] Medical Record Number: [id number] Patient Account Number: [id number] Date of Birth/Sex: Treating RN: 12-17-1955 (66 y.o. [name], [name] Primary Care Jazir Newey: [name] Other Clinician: Referring Matheus Spiker: Treating Izaias Krupka/Extender: [name] in Treatment: 3 Encounter Discharge Information Items Discharge  Condition: Stable Ambulatory Status: Ambulatory Discharge Destination: Home Transportation: Private Auto Accompanied By: husband Schedule Follow-up Appointment: Yes Clinical Summary of Care: Patient Declined Electronic Signature(s) Signed: 10/26/2021 4:25:50 PM By: Rhae Hammock RN Entered By: Rhae Hammock on 10/26/2021 10:58:34 -------------------------------------------------------------------------------- Lower Extremity Assessment Details Patient Name: Date of Service: [name], [name] 10/26/2021 10:00 [name] Medical Record Number: [id number] Patient Account Number: [id number] Date of Birth/Sex: Treating RN: [birth year] (66 y.o. [name], [name] Primary Care Denario Bagot: [name] Other Clinician: Referring Zacherie Honeyman: Treating Merri Dimaano/Extender: [name] in Treatment: 3 Edema Assessment Assessed: [name]: No] [Right: Yes] Edema: [Left: Ye] [Right: s] Calf Left: Right: Point of Measurement: From Medial Instep 47 cm Ankle Left: Right: Point of Measurement: From Medial Instep 26.7 cm Vascular Assessment Pulses: Dorsalis Pedis Palpable: [Right:Yes] Posterior Tibial Palpable: [Right:Yes] Electronic Signature(s) Signed: 10/26/2021 4:25:50 PM By: Rhae Hammock RN Entered By: Rhae Hammock on 10/26/2021 10:09:56 -------------------------------------------------------------------------------- Multi Wound Chart Details Patient Name: Date of Service: [name], [name] 10/26/2021 10:00 [name] Medical Record Number: [id number] Patient Account Number: [id number] Date of Birth/Sex: Treating RN: 06-Jun-1955 (66 y.o. F) Primary Care Valora Norell: [name] Other Clinician: Referring Ashanna Heinsohn: Treating Evren Shankland/Extender: [name] in Treatment: 3 Vital Signs Height(in): Pulse(bpm): 71 Weight(lbs): Blood Pressure(mmHg): 133/74 Body Mass Index(BMI): Temperature(F):  97.8 Respiratory Rate(breaths/min): 17 Photos: [4:Right, Plantar Metatarsal head first] [N/[name]:N/[name] N/[name]] Wound Location: [4:Gradually Appeared] [N/[name]:N/[name]] Wounding Event: [4:Diabetic Wound/Ulcer of the Lower] [N/[name]:N/[name]] Primary Etiology: [4:Extremity Sleep Apnea, Coronary Artery] [N/[name]:N/[name]] Comorbid History: [4:Disease, Hypertension, Type II Diabetes, Neuropathy 07/22/2021] [N/[name]:N/[name]] Date Acquired: [4:3] [N/[name]:N/[name]] Weeks of Treatment: [4:Open] [N/[name]:N/[name]] Wound Status: [4:No] [N/[name]:N/[name]] Wound Recurrence: [4:Yes] [N/[name]:N/[name]] Pending [name] mputation on Presentation: [4:1x1.2x0.5] [N/[name]:N/[name]] Measurements L x W x D (cm) [4:0.942] [N/[name]:N/[name]] [name] (cm) : rea [4:0.471] [N/[name]:N/[name]] Volume (cm) : [4:-1226.80%] [N/[name]:N/[name]] % Reduction in [name] rea: [4:-201.90%] [N/[name]:N/[name]] % Reduction in Volume: [4:12] Starting Position 1 (o'clock): [4:12] Ending  Position 1 (o'clock): [4:0.5] Maximum Distance 1 (cm): [4:Yes] [N/[name]:N/[name]] Undermining: [4:Grade 2] [N/[name]:N/[name]] Classification: [4:Large] [N/[name]:N/[name]] Exudate [name] mount: [4:Serosanguineous] [N/[name]:N/[name]] Exudate Type: [4:red, brown] [N/[name]:N/[name]] Exudate Color: [4:Distinct, outline attached] [N/[name]:N/[name]] Wound Margin: [4:Large (67-100%)] [N/[name]:N/[name]] Granulation [name] mount: [4:Red, Pink] [N/[name]:N/[name]] Granulation Quality: [4:Small (1-33%)] [N/[name]:N/[name]] Necrotic [name] mount: [4:Fat Layer (Subcutaneous Tissue): Yes N/[name]] Exposed Structures: [4:Fascia: No Tendon: No Muscle: No Joint: No Bone: No None] [N/[name]:N/[name]] Epithelialization: [4:T Contact Cast otal] [N/[name]:N/[name]] Treatment Notes Wound #4 (Metatarsal head first) Wound Laterality: Plantar, Right Cleanser Soap and Water Discharge Instruction: May shower and wash wound with dial antibacterial soap and water prior to dressing change. Wound Cleanser Discharge Instruction: Cleanse the wound with wound cleanser prior to applying [name] clean dressing using gauze sponges, not tissue or cotton balls. Peri-Wound Care Topical Primary Dressing Hydrofera Blue Ready Foam, 4x5  in Discharge Instruction: Apply to wound bed as instructed Secondary Dressing Optifoam Non-Adhesive Dressing, 4x4 in Discharge Instruction: Apply [name] foam donut over primary dressing as directed. Woven Gauze Sponge, Non-Sterile 4x4 in Discharge Instruction: Apply over primary dressing as directed. Secured With [employer], 4.5x3.1 (in/yd) Discharge Instruction: Secure with Kerlix as directed. 52M Medipore H Soft Cloth Surgical T ape, 4 x 10 (in/yd) Discharge Instruction: Secure with tape as directed. Compression Wrap Compression Stockings Add-Ons Electronic Signature(s) Signed: 10/26/2021 12:26:30 PM By: Kalman Shan DO Entered By: Kalman Shan on 10/26/2021 11:47:23 -------------------------------------------------------------------------------- Multi-Disciplinary Care Plan Details Patient Name: Date of Service: [name], [name] 10/26/2021 10:00 [name] Medical Record Number: [id number] Patient Account Number: [id number] Date of Birth/Sex: Treating RN: June 04, 1955 (66 y.o. [name], [name] Primary Care Fenix Ruppe: [name] Other Clinician: Referring Joyelle Siedlecki: Treating Emiley Digiacomo/Extender: [name] in Treatment: 3 Active Inactive Wound/Skin Impairment Nursing Diagnoses: Impaired tissue integrity Knowledge deficit related to ulceration/compromised skin integrity Goals: Patient will have [name] decrease in wound volume by X% from date: (specify in notes) Date Initiated: 10/01/2021 Target Resolution Date: 11/20/2021 Goal Status: Active Patient/caregiver will verbalize understanding of skin care regimen Date Initiated: 10/01/2021 Target Resolution Date: 11/20/2021 Goal Status: Active Ulcer/skin breakdown will have [name] volume reduction of 30% by week 4 Date Initiated: 10/01/2021 Target Resolution Date: 11/20/2021 Goal Status: Active Interventions: Assess patient/caregiver ability to obtain necessary supplies Assess  patient/caregiver ability to perform ulcer/skin care regimen upon admission and as needed Assess ulceration(s) every visit Notes: Electronic Signature(s) Signed: 10/26/2021 4:25:50 PM By: Rhae Hammock RN Entered By: Rhae Hammock on 10/26/2021 10:24:09 -------------------------------------------------------------------------------- Pain Assessment Details Patient Name: Date of Service: [name], [name] 10/26/2021 10:00 [name] Medical Record Number: [id number] Patient Account Number: [id number] Date of Birth/Sex: Treating RN: [birth year] (66 y.o. [name], [name] Primary Care Jacobi Nile: [name] Other Clinician: Referring Amaru Burroughs: Treating Gwenneth Whiteman/Extender: [name] in Treatment: 3 Active Problems Location of Pain Severity and Description of Pain Patient Has Paino No Site Locations Pain Management and Medication Current Pain Management: Electronic Signature(s) Signed: 10/26/2021 4:25:50 PM By: Rhae Hammock RN Entered By: Rhae Hammock on 10/26/2021 10:09:48 -------------------------------------------------------------------------------- Patient/Caregiver Education Details Patient Name: Date of Service: [name], [name] 9/5/2023andnbsp10:00 [facility] Record Number: [id number] Patient Account Number: [id number] Date of Birth/Gender: Treating RN: 1955-06-07 (66 y.o. [name], [name] Primary Care Physician: [name] Other Clinician: Referring Physician: Treating Physician/Extender: [name] in Treatment: 3 Education Assessment Education Provided To: Patient Education Topics Provided Wound/Skin Impairment: Methods: Explain/Verbal Responses: Reinforcements needed,  State content correctly Electronic Signature(s) Signed: 10/26/2021 4:25:50 PM By: Rhae Hammock RN Entered By: Rhae Hammock on 10/26/2021  10:56:30 -------------------------------------------------------------------------------- Wound Assessment Details Patient Name: Date of Service: [name], [name] 10/26/2021 10:00 [facility] Record Number: [id number] Patient Account Number: [id number] Date of Birth/Sex: Treating RN: 23-Nov-1955 (66 y.o. [name], [name] Primary Care Kord Monette: [name] Other Clinician: Referring Griselda Bramblett: Treating Sevanna Ballengee/Extender: [name] in Treatment: 3 Wound Status Wound Number: 4 Primary Diabetic Wound/Ulcer of the Lower Extremity Etiology: Wound Location: Right, Plantar Metatarsal head first Wound Open Wounding Event: Gradually Appeared Status: Date Acquired: 07/22/2021 Comorbid Sleep Apnea, Coronary Artery Disease, Hypertension, Type II Weeks Of Treatment: 3 History: Diabetes, Neuropathy Clustered Wound: No Pending Amputation On Presentation Photos Wound Measurements Length: (cm) 1 Width: (cm) 1.2 Depth: (cm) 0.5 Area: (cm) 0.942 Volume: (cm) 0.471 % Reduction in Area: -1226.8% % Reduction in Volume: -201.9% Epithelialization: None Tunneling: No Undermining: Yes Starting Position (o'clock): 12 Ending Position (o'clock): 12 Maximum Distance: (cm) 0.5 Wound Description Classification: Grade 2 Wound Margin: Distinct, outline attached Exudate Amount: Large Exudate Type: Serosanguineous Exudate Color: red, brown Foul Odor After Cleansing: No Slough/Fibrino Yes Wound Bed Granulation Amount: Large (67-100%) Exposed Structure Granulation Quality: Red, Pink Fascia Exposed: No Necrotic Amount: Small (1-33%) Fat Layer (Subcutaneous Tissue) Exposed: Yes Necrotic Quality: Adherent Slough Tendon Exposed: No Muscle Exposed: No Joint Exposed: No Bone Exposed: No Treatment Notes Wound #4 (Metatarsal head first) Wound Laterality: Plantar, Right Cleanser Soap and Water Discharge Instruction: May shower and wash wound with dial  antibacterial soap and water prior to dressing change. Wound Cleanser Discharge Instruction: Cleanse the wound with wound cleanser prior to applying [name] clean dressing using gauze sponges, not tissue or cotton balls. Peri-Wound Care Topical Primary Dressing Hydrofera Blue Ready Foam, 4x5 in Discharge Instruction: Apply to wound bed as instructed Secondary Dressing Optifoam Non-Adhesive Dressing, 4x4 in Discharge Instruction: Apply [name] foam donut over primary dressing as directed. Woven Gauze Sponge, Non-Sterile 4x4 in Discharge Instruction: Apply over primary dressing as directed. Secured With [employer], 4.5x3.1 (in/yd) Discharge Instruction: Secure with Kerlix as directed. 35M Medipore H Soft Cloth Surgical T ape, 4 x 10 (in/yd) Discharge Instruction: Secure with tape as directed. Compression Wrap Compression Stockings Add-Ons Electronic Signature(s) Signed: 10/26/2021 4:25:50 PM By: Rhae Hammock RN Signed: 10/26/2021 6:08:07 PM By: Deon Pilling RN, BSN Entered By: Deon Pilling on 10/26/2021 10:14:43 -------------------------------------------------------------------------------- Vitals Details Patient Name: Date of Service: [name], [name] 10/26/2021 10:00 [name] Medical Record Number: [id number] Patient Account Number: [id number] Date of Birth/Sex: Treating RN: [birth year] (66 y.o. [name], [name] Primary Care Holiday Mcmenamin: [name] Other Clinician: Referring Jackie Russman: Treating Breshay Ilg/Extender: [name] in Treatment: 3 Vital Signs Time Taken: 10:08 Temperature (F): 97.8 Pulse (bpm): 74 Respiratory Rate (breaths/min): 17 Blood Pressure (mmHg): 133/74 Reference Range: 80 - 120 mg / dl Electronic Signature(s) Signed: 10/26/2021 4:25:50 PM By: Rhae Hammock RN Entered By: Rhae Hammock on 10/26/2021 10:09:44

## 2021-10-26 NOTE — Progress Notes (Signed)
[name], [name] ([id number]) Visit Report for 10/26/2021 Chief Complaint Document Details Patient Name: Date of Service: [name], [name] 10/26/2021 10:00 [name] M Medical Record Number: [id number] Patient Account Number: [id number] Date of Birth/Sex: Treating RN: 22-Sep-1955 (66 y.o. F) Primary Care Provider: [name] Other Clinician: Referring Provider: Treating Provider/Extender: [name] in Treatment: 3 Information Obtained from: Patient Chief Complaint 10/27; Right Plantar foot wound 11/21; 2 small areas of skin breakdown to the right foot following cast placement 8/29; right plantar foot wound Electronic Signature(s) Signed: 10/26/2021 12:26:30 PM By: Kalman Shan DO Entered By: Kalman Shan on 10/26/2021 11:47:30 -------------------------------------------------------------------------------- HPI Details Patient Name: Date of Service: [name], [name] 10/26/2021 10:00 [name] M Medical Record Number: [id number] Patient Account Number: [id number] Date of Birth/Sex: Treating RN: October 10, 1955 (66 y.o. F) Primary Care Provider: [name] Other Clinician: Referring Provider: Treating Provider/Extender: [name] in Treatment: 3 History of Present Illness HPI Description: Admission 12/17/2020 [name] is [name] 66 year old female with [name] past medical history of insulin-dependent type 2 diabetes, hypothyroidism and daily1 pack per day cigarette smoker the presents to the clinic for [name] 6-week history of nonhealing wound to the right first MTPJ. She has been following with [name], podiatry for this issue. She has been using silver alginate with dressing changes. She uses [name] postsurgical shoe and offloading pads. She currently denies signs of infection. 10/31; patient presents for follow-up. She tolerated the soft cast fine although she states that she felt her foot rolling to one side.  She denies signs of infection. She would like to do the total contact cast today. 12/23/2020 upon evaluation today patient appears to be doing excellent in regard to her wound on the foot and she is in [name] total contact cast. I do think this is appropriate this is the first cast change which we are obliged to do to ensure nothing is rubbing everything appears to be doing quite well and very pleased in that regard. 11/7; patient presents for follow-up. She had no issues with the cast. She denies signs of infection. 11/14; patient presents for 1 week follow-up. She has had no issues with the cast. She denies signs of infection. 11/21; patient presents for 1 week follow-up. She did develop 2 small areas of skin breakdown on either side of the right foot from the cast rubbing. She states she did not feel the wounds developing. She currently denies signs of infection. 11/28; patient presents for 1 week follow-up. She has no issues or complaints today. She denies pain or acute signs of infection. 12/12; patient presents for follow-up. She reports improvement to her right lateral foot wound. She has been using silver alginate to the area. She has no issues or complaints today. 12/19; patient presents for follow-up. She reports that her right lateral foot wound has healed. She has no issues or complaints today. 1/3; patient presents for follow-up. She has no issues or complaints today. She reports no open wounds. Readmission 10/01/2021 [name] [name] 66 year old female's with [name] past medical history of insulin-dependent controlled type 2 diabetes complicated by peripheral neuropathy that presents to the clinic for [name] 35-monthhistory of nonhealing ulcer to the right foot. I have seen her before for [name] wound to the same area that was treated and healed. Her wound today however this is much deeper and it has thick yellow drainage. She is not sure exactly how  it started. She noticed it 1 day. She has been  following with [name] for this issue. She had [name] wound culture that showed [name] mix of organisms on 09/01/2021. She was recently started on doxycycline. She is using the Conservation officer, nature. She has been using Iodosorb with dressing changes. She currently denies systemic signs of infection. 8/21; patient presents for follow-up. She states she started and completed levofloxacin. She is still taking doxycycline. She reports improvement in drainage and odor. She has been doing Dakin's wet-to-dry dressings as well. She is using her front offloading shoe. She denies signs of infection. 8/29; patient presents for follow-up. She is still taking doxycycline. She continues to report improvement in drainage and reports no odor. She denies any purulent drainage. She has been doing silver alginate to the wound bed and using her front offloading shoe. 9/5; patient presents for follow-up. She completed the course of levofloxacin. She has someone that is able to drive her today so we can place the total contact cast. She reports no signs of infection. We have been doing silver alginate to the wound bed. Electronic Signature(s) Signed: 10/26/2021 12:26:30 PM By: Kalman Shan DO Entered By: Kalman Shan on 10/26/2021 11:48:16 -------------------------------------------------------------------------------- Physical Exam Details Patient Name: Date of Service: [name], [name] 10/26/2021 10:00 [name] M Medical Record Number: [id number] Patient Account Number: [id number] Date of Birth/Sex: Treating RN: 11-05-1955 (66 y.o. F) Primary Care Provider: [name] Other Clinician: Referring Provider: Treating Provider/Extender: [name] in Treatment: 3 Constitutional respirations regular, non-labored and within target range for patient.. Cardiovascular 2+ dorsalis pedis/posterior tibialis pulses. Psychiatric pleasant and cooperative. Notes Right foot: T the first  submetatarsal there is an open wound with circumferential undermining. Granulation tissue noted. No probing to bone. No signs of o surrounding infection. Electronic Signature(s) Signed: 10/26/2021 12:26:30 PM By: Kalman Shan DO Entered By: Kalman Shan on 10/26/2021 11:49:15 -------------------------------------------------------------------------------- Physician Orders Details Patient Name: Date of Service: [name], [name] 10/26/2021 10:00 [name] M Medical Record Number: [id number] Patient Account Number: [id number] Date of Birth/Sex: Treating RN: May 10, 1955 (66 y.o. [name], [name] Primary Care Provider: [name] Other Clinician: Referring Provider: Treating Provider/Extender: [name] in Treatment: 3 Verbal / Phone Orders: No Diagnosis Coding Follow-up Appointments ppointment in 1 week. - this Thursday (first TCC change) and then on Monday's every week w/ Dr. Heber King and Queen and Elias Else # 9 Return [name] Bathing/ Shower/ Hygiene May shower with protection but do not get wound dressing(s) wet. Edema Control - Lymphedema / SCD / Other Elevate legs to the level of the heart or above for 30 minutes daily and/or when sitting, [name] frequency of: Avoid standing for long periods of time. Off-Loading Total Contact Cast to Right Lower Extremity Wound Treatment Wound #4 - Metatarsal head first Wound Laterality: Plantar, Right Cleanser: Soap and Water 2 x Per Day/15 Days Discharge Instructions: May shower and wash wound with dial antibacterial soap and water prior to dressing change. Cleanser: Wound Cleanser (Generic) 2 x Per Day/15 Days Discharge Instructions: Cleanse the wound with wound cleanser prior to applying [name] clean dressing using gauze sponges, not tissue or cotton balls. Prim Dressing: Hydrofera Blue Ready Foam, 4x5 in 2 x Per Day/15 Days ary Discharge Instructions: Apply to wound bed as instructed Secondary Dressing: Optifoam  Non-Adhesive Dressing, 4x4 in (Generic) 2 x Per Day/15 Days Discharge Instructions: Apply [name] foam donut over primary dressing as directed. Secondary Dressing: Woven  Gauze Sponge, Non-Sterile 4x4 in (Generic) 2 x Per Day/15 Days Discharge Instructions: Apply over primary dressing as directed. Secured With: [employer], 4.5x3.1 (in/yd) (Generic) 2 x Per Day/15 Days Discharge Instructions: Secure with Kerlix as directed. Secured With: 73M Medipore H Soft Cloth Surgical T ape, 4 x 10 (in/yd) (Generic) 2 x Per Day/15 Days Discharge Instructions: Secure with tape as directed. Electronic Signature(s) Signed: 10/26/2021 12:26:30 PM By: Kalman Shan DO Entered By: Kalman Shan on 10/26/2021 11:49:28 -------------------------------------------------------------------------------- Problem List Details Patient Name: Date of Service: [name], [name] 10/26/2021 10:00 [name] M Medical Record Number: [id number] Patient Account Number: [id number] Date of Birth/Sex: Treating RN: 12/28/1955 (66 y.o. F) Primary Care Provider: [name] Other Clinician: Referring Provider: Treating Provider/Extender: [name] in Treatment: 3 Active Problems ICD-10 Encounter Code Description Active Date MDM Diagnosis L97.512 Non-pressure 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 10/01/2021 No Yes E11.621 Type 2 diabetes mellitus with foot ulcer 10/01/2021 No Yes E11.42 Type 2 diabetes mellitus with diabetic polyneuropathy 10/01/2021 No Yes Inactive Problems Resolved Problems Electronic Signature(s) Signed: 10/26/2021 12:26:30 PM By: Kalman Shan DO Entered By: Kalman Shan on 10/26/2021 11:47:15 -------------------------------------------------------------------------------- Progress Note Details Patient Name: Date of Service: [place], [name] 10/26/2021 10:00 [name] M Medical Record Number: [id number] Patient Account Number:  [id number] Date of Birth/Sex: Treating RN: Nov 20, 1955 (66 y.o. F) Primary Care Provider: [name] Other Clinician: Referring Provider: Treating Provider/Extender: [name] in Treatment: 3 Subjective Chief Complaint Information obtained from Patient 10/27; Right Plantar foot wound 11/21; 2 small areas of skin breakdown to the right foot following cast placement 8/29; right plantar foot wound History of Present Illness (HPI) Admission 12/17/2020 Ms. Dorothe Elmore is [name] 66 year old female with [name] past medical history of insulin-dependent type 2 diabetes, hypothyroidism and daily1 pack per day cigarette smoker the presents to the clinic for [name] 6-week history of nonhealing wound to the right first MTPJ. She has been following with [name], podiatry for this issue. She has been using silver alginate with dressing changes. She uses [name] postsurgical shoe and offloading pads. She currently denies signs of infection. 10/31; patient presents for follow-up. She tolerated the soft cast fine although she states that she felt her foot rolling to one side. She denies signs of infection. She would like to do the total contact cast today. 12/23/2020 upon evaluation today patient appears to be doing excellent in regard to her wound on the foot and she is in [name] total contact cast. I do think this is appropriate this is the first cast change which we are obliged to do to ensure nothing is rubbing everything appears to be doing quite well and very pleased in that regard. 11/7; patient presents for follow-up. She had no issues with the cast. She denies signs of infection. 11/14; patient presents for 1 week follow-up. She has had no issues with the cast. She denies signs of infection. 11/21; patient presents for 1 week follow-up. She did develop 2 small areas of skin breakdown on either side of the right foot from the cast rubbing. She states she did not feel the wounds  developing. She currently denies signs of infection. 11/28; patient presents for 1 week follow-up. She has no issues or complaints today. She denies pain or acute signs of infection. 12/12; patient presents for follow-up. She reports improvement to her right lateral foot wound. She has  been using silver alginate to the area. She has no issues or complaints today. 12/19; patient presents for follow-up. She reports that her right lateral foot wound has healed. She has no issues or complaints today. 1/3; patient presents for follow-up. She has no issues or complaints today. She reports no open wounds. Readmission 10/01/2021 Ms. Shaquavia Whisonant is [name] 66 year old female's with [name] past medical history of insulin-dependent controlled type 2 diabetes complicated by peripheral neuropathy that presents to the clinic for [name] 63-monthhistory of nonhealing ulcer to the right foot. I have seen her before for [name] wound to the same area that was treated and healed. Her wound today however this is much deeper and it has thick yellow drainage. She is not sure exactly how it started. She noticed it 1 day. She has been following with Dr. EAmalia Haileyfor this issue. She had [name] wound culture that showed [name] mix of organisms on 09/01/2021. She was recently started on doxycycline. She is using the dConservation officer, nature She has been using Iodosorb with dressing changes. She currently denies systemic signs of infection. 8/21; patient presents for follow-up. She states she started and completed levofloxacin. She is still taking doxycycline. She reports improvement in drainage and odor. She has been doing Dakin's wet-to-dry dressings as well. She is using her front offloading shoe. She denies signs of infection. 8/29; patient presents for follow-up. She is still taking doxycycline. She continues to report improvement in drainage and reports no odor. She denies any purulent drainage. She has been doing silver alginate to the wound bed and using her front  offloading shoe. 9/5; patient presents for follow-up. She completed the course of levofloxacin. She has someone that is able to drive her today so we can place the total contact cast. She reports no signs of infection. We have been doing silver alginate to the wound bed. Patient History Information obtained from Patient, Chart. Family History Unknown History. Social History Current every day smoker - 1 pack/[name]/day, Alcohol Use - Never, Drug Use - No History, Caffeine Use - Moderate. Medical History Eyes Denies history of Cataracts, Glaucoma, Optic Neuritis Ear/Nose/Mouth/Throat Denies history of Chronic sinus problems/congestion, Middle ear problems Hematologic/Lymphatic Denies history of Anemia, Hemophilia, Human Immunodeficiency Virus, Lymphedema, Sickle Cell Disease Respiratory Patient has history of Sleep Apnea Denies history of Aspiration, Asthma, Chronic Obstructive Pulmonary Disease (COPD), Pneumothorax, Tuberculosis Cardiovascular Patient has history of Coronary Artery Disease, Hypertension Denies history of Angina, Arrhythmia, Congestive Heart Failure, Deep Vein Thrombosis, Hypotension, Myocardial Infarction, Peripheral Arterial Disease, Peripheral Venous Disease, Phlebitis, Vasculitis Gastrointestinal Denies history of Cirrhosis , Colitis, Crohnoos, Hepatitis [name], Hepatitis B, Hepatitis C Endocrine Patient has history of Type II Diabetes Denies history of Type I Diabetes Genitourinary Denies history of End Stage Renal Disease Immunological Denies history of Lupus Erythematosus, Raynaudoos, Scleroderma Integumentary (Skin) Denies history of History of Burn Musculoskeletal Denies history of Gout, Rheumatoid Arthritis, Osteoarthritis, Osteomyelitis Neurologic Patient has history of Neuropathy Denies history of Dementia, Quadriplegia, Paraplegia, Seizure Disorder Hospitalization/Surgery History - 2nd and 3rd right toe amps. - total hysterectomy. - cholecystectomy. - 2 knee  surgery's on left and 1 on right. Medical [name] Surgical History Notes nd Cardiovascular hypercholesterolemia Objective Constitutional respirations regular, non-labored and within target range for patient.. Vitals Time Taken: 10:08 AM, Temperature: 97.8 F, Pulse: 74 bpm, Respiratory Rate: 17 breaths/min, Blood Pressure: 133/74 mmHg. Cardiovascular 2+ dorsalis pedis/posterior tibialis pulses. Psychiatric pleasant and cooperative. General Notes: Right foot: T the first submetatarsal there is an open wound with circumferential undermining.  Granulation tissue noted. No probing to bone. No o signs of surrounding infection. Integumentary (Hair, Skin) Wound #4 status is Open. Original cause of wound was Gradually Appeared. The date acquired was: 07/22/2021. The wound has been in treatment 3 weeks. The wound is located on the Right,Plantar Metatarsal head first. The wound measures 1cm length x 1.2cm width x 0.5cm depth; 0.942cm^2 area and 0.471cm^3 volume. There is Fat Layer (Subcutaneous Tissue) exposed. There is no tunneling noted, however, there is undermining starting at 12:00 and ending at 12:00 with [name] maximum distance of 0.5cm. There is [name] large amount of serosanguineous drainage noted. The wound margin is distinct with the outline attached to the wound base. There is large (67-100%) red, pink granulation within the wound bed. There is [name] small (1-33%) amount of necrotic tissue within the wound bed including Adherent Slough. Assessment Active Problems ICD-10 Non-pressure 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 Type 2 diabetes mellitus with foot ulcer Type 2 diabetes mellitus with diabetic polyneuropathy Patient's wound has shown improvement in size and appearance since last clinic visit. At this time I recommended placing the total contact cast. She will follow-up in 2 days for obligatory cast change. She has [name] driver. I recommended Hydrofera Blue under the  cast. Procedures Wound #4 Pre-procedure diagnosis of Wound #4 is [name] Diabetic Wound/Ulcer of the Lower Extremity located on the Right,Plantar Metatarsal head first . There was [name] Total Contact Cast Procedure by Kalman Shan, DO. Post procedure Diagnosis Wound #4: Same as Pre-Procedure Plan Follow-up Appointments: Return Appointment in 1 week. - this Thursday (first TCC change) and then on Monday's every week w/ Dr. Heber Crane and Elias Else # 9 Bathing/ Shower/ Hygiene: May shower with protection but do not get wound dressing(s) wet. Edema Control - Lymphedema / SCD / Other: Elevate legs to the level of the heart or above for 30 minutes daily and/or when sitting, [name] frequency of: Avoid standing for long periods of time. Off-Loading: T Contact Cast to Right Lower Extremity otal WOUND #4: - Metatarsal head first Wound Laterality: Plantar, Right Cleanser: Soap and Water 2 x Per Day/15 Days Discharge Instructions: May shower and wash wound with dial antibacterial soap and water prior to dressing change. Cleanser: Wound Cleanser (Generic) 2 x Per Day/15 Days Discharge Instructions: Cleanse the wound with wound cleanser prior to applying [name] clean dressing using gauze sponges, not tissue or cotton balls. Prim Dressing: Hydrofera Blue Ready Foam, 4x5 in 2 x Per Day/15 Days ary Discharge Instructions: Apply to wound bed as instructed Secondary Dressing: Optifoam Non-Adhesive Dressing, 4x4 in (Generic) 2 x Per Day/15 Days Discharge Instructions: Apply [name] foam donut over primary dressing as directed. Secondary Dressing: Woven Gauze Sponge, Non-Sterile 4x4 in (Generic) 2 x Per Day/15 Days Discharge Instructions: Apply over primary dressing as directed. Secured With: [employer], 4.5x3.1 (in/yd) (Generic) 2 x Per Day/15 Days Discharge Instructions: Secure with Kerlix as directed. Secured With: 77M Medipore H Soft Cloth Surgical T ape, 4 x 10 (in/yd) (Generic) 2 x Per Day/15 Days Discharge  Instructions: Secure with tape as directed. 1. Hydrofera Blue blue 2. T contact cast placed in standard fashion otal 3. Follow-up in 1 week Electronic Signature(s) Signed: 10/26/2021 12:26:30 PM By: Kalman Shan DO Entered By: Kalman Shan on 10/26/2021 11:50:18 -------------------------------------------------------------------------------- HxROS Details Patient Name: Date of Service: [name], [name] 10/26/2021 10:00 [name] M Medical Record Number: [id number] Patient Account Number: [id number] Date of Birth/Sex: Treating RN: 1955-05-15 (  66 y.o. F) Primary Care Provider: [name] Other Clinician: Referring Provider: Treating Provider/Extender: [name] in Treatment: 3 Information Obtained From Patient Chart Eyes Medical History: Negative for: Cataracts; Glaucoma; Optic Neuritis Ear/Nose/Mouth/Throat Medical History: Negative for: Chronic sinus problems/congestion; Middle ear problems Hematologic/Lymphatic Medical History: Negative for: Anemia; Hemophilia; Human Immunodeficiency Virus; Lymphedema; Sickle Cell Disease Respiratory Medical History: Positive for: Sleep Apnea Negative for: Aspiration; Asthma; Chronic Obstructive Pulmonary Disease (COPD); Pneumothorax; Tuberculosis Cardiovascular Medical History: Positive for: Coronary Artery Disease; Hypertension Negative for: Angina; Arrhythmia; Congestive Heart Failure; Deep Vein Thrombosis; Hypotension; Myocardial Infarction; Peripheral Arterial Disease; Peripheral Venous Disease; Phlebitis; Vasculitis Past Medical History Notes: hypercholesterolemia Gastrointestinal Medical History: Negative for: Cirrhosis ; Colitis; Crohns; Hepatitis [name]; Hepatitis B; Hepatitis C Endocrine Medical History: Positive for: Type II Diabetes Negative for: Type I Diabetes Genitourinary Medical History: Negative for: End Stage Renal Disease Immunological Medical History: Negative for:  Lupus Erythematosus; Raynauds; Scleroderma Integumentary (Skin) Medical History: Negative for: History of Burn Musculoskeletal Medical History: Negative for: Gout; Rheumatoid Arthritis; Osteoarthritis; Osteomyelitis Neurologic Medical History: Positive for: Neuropathy Negative for: Dementia; Quadriplegia; Paraplegia; Seizure Disorder Immunizations Pneumococcal Vaccine: Received Pneumococcal Vaccination: Yes Received Pneumococcal Vaccination On or After 60th Birthday: Yes Tetanus Vaccine: Last tetanus shot: 12/17/2020 Implantable Devices No devices added Hospitalization / Surgery History Type of Hospitalization/Surgery 2nd and 3rd right toe amps total hysterectomy cholecystectomy 2 knee surgery's on left and 1 on right Family and Social History Unknown History: Yes; Current every day smoker - 1 pack/[name]/day; Alcohol Use: Never; Drug Use: No History; Caffeine Use: Moderate; Financial Concerns: No; Food, Clothing or Shelter Needs: No; Support System Lacking: No; Transportation Concerns: No Electronic Signature(s) Signed: 10/26/2021 12:26:30 PM By: Kalman Shan DO Entered By: Kalman Shan on 10/26/2021 11:48:22 -------------------------------------------------------------------------------- Total Contact Cast Details Patient Name: Date of Service: [name], [name] 10/26/2021 10:00 [name] M Medical Record Number: [id number] Patient Account Number: [id number] Date of Birth/Sex: Treating RN: 1955/06/29 (66 y.o. [name], [name] Primary Care Provider: [name] Other Clinician: Referring Provider: Treating Provider/Extender: [name] in Treatment: 3 T Contact Cast Applied for Wound Assessment: otal Wound #4 Right,Plantar Metatarsal head first Performed By: Physician Kalman Shan, DO Post Procedure Diagnosis Same as Pre-procedure Electronic Signature(s) Signed: 10/26/2021 12:26:30 PM By: Kalman Shan DO Signed:  10/26/2021 4:25:50 PM By: Rhae Hammock RN Entered By: Rhae Hammock on 10/26/2021 10:33:16 -------------------------------------------------------------------------------- SuperBill Details Patient Name: Date of Service: [name], [name] 10/26/2021 Medical Record Number: [id number] Patient Account Number: [id number] Date of Birth/Sex: Treating RN: 17-Aug-1955 (66 y.o. [name], [name] Primary Care Provider: [name] Other Clinician: Referring Provider: Treating Provider/Extender: [name] in Treatment: 3 Diagnosis Coding ICD-10 Codes Code Description [phone] Non-pressure 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 E11.621 Type 2 diabetes mellitus with foot ulcer E11.42 Type 2 diabetes mellitus with diabetic polyneuropathy Facility Procedures Physician Procedures : CPT4 Code Description Modifier [id number] [phone] - WC PHYS APPLY TOTAL CONTACT CAST ICD-10 Diagnosis Description L97.512 Non-pressure 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 Quantity: 1 Electronic Signature(s) Signed: 10/26/2021 12:26:30 PM By: Kalman Shan DO Entered By: Kalman Shan on 10/26/2021 11:50:26

## 2021-10-28 ENCOUNTER — Encounter (HOSPITAL_BASED_OUTPATIENT_CLINIC_OR_DEPARTMENT_OTHER): Payer: Medicare Other | Admitting: Internal Medicine

## 2021-10-28 DIAGNOSIS — L97512 Non-pressure 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: Secondary | ICD-10-CM

## 2021-10-28 DIAGNOSIS — E11621 Type 2 diabetes mellitus with foot ulcer: Secondary | ICD-10-CM

## 2021-10-28 DIAGNOSIS — E1142 Type 2 diabetes mellitus with diabetic polyneuropathy: Secondary | ICD-10-CM | POA: Diagnosis not present

## 2021-10-28 NOTE — Progress Notes (Addendum)
MONE, COMMISSO ([id number]) [id number] Page 1 of 9 Visit Report for 10/28/2021 Chief Complaint Document Details Patient Name: Date of Service: [name], [name] 10/28/2021 10:45 [name] Medical Record Number: [id number] Patient Account Number: [id number] Date of Birth/Sex: Treating RN: [birth year] (66 y.o. F) Primary Care Provider: [name] Other Clinician: Referring Provider: Treating Provider/Extender: [name] in Treatment: 3 Information Obtained from: Patient Chief Complaint 10/27; Right Plantar foot wound 11/21; 2 small areas of skin breakdown to the right foot following cast placement 8/29; right plantar foot wound Electronic Signature(s) Signed: 10/28/2021 1:45:58 PM By: Kalman Shan DO Entered By: Kalman Shan on 10/28/2021 11:23:54 -------------------------------------------------------------------------------- HPI Details Patient Name: Date of Service: [name], [name] 10/28/2021 10:45 [name] Medical Record Number: [id number] Patient Account Number: [id number] Date of Birth/Sex: Treating RN: [birth year] (66 y.o. F) Primary Care Provider: [name] Other Clinician: Referring Provider: Treating Provider/Extender: [name] in Treatment: 3 History of Present Illness HPI Description: Admission 12/17/2020 [name] [name] 66 year old female with [name] past medical history of insulin-dependent type 2 diabetes, hypothyroidism and daily1 pack per day cigarette smoker the presents to the clinic for [name] 6-week history of nonhealing wound to the right first MTPJ. She has been following with [name], podiatry for this issue. She has been using silver alginate with dressing changes. She uses [name] postsurgical shoe and offloading pads. She currently denies signs of infection. 10/31; patient presents for follow-up. She tolerated the soft cast fine although she  states that she felt her foot rolling to one side. She denies signs of infection. She would like to do the total contact cast today. 12/23/2020 upon evaluation today patient appears to be doing excellent in regard to her wound on the foot and she is in [name] total contact cast. I do think this is appropriate this is the first cast change which we are obliged to do to ensure nothing is rubbing everything appears to be doing quite well and very pleased in that regard. 11/7; patient presents for follow-up. She had no issues with the cast. She denies signs of infection. 11/14; patient presents for 1 week follow-up. She has had no issues with the cast. She denies signs of infection. 11/21; patient presents for 1 week follow-up. She did develop 2 small areas of skin breakdown on either side of the right foot from the cast rubbing. She states she did not feel the wounds developing. She currently denies signs of infection. 11/28; patient presents for 1 week follow-up. She has no issues or complaints today. She denies pain or acute signs of infection. 12/12; patient presents for follow-up. She reports improvement to her right lateral foot wound. She has been using silver alginate to the area. She has no issues or complaints today. 12/19; patient presents for follow-up. She reports that her right lateral foot wound has healed. She has no issues or complaints today. 1/3; patient presents for follow-up. She has no issues or complaints today. She reports no open wounds. [name], [name] ([id number]) [id number] Page 2 of 9 Readmission 10/01/2021 [name] [name] 66 year old female's with [name] past medical history of insulin-dependent controlled type 2 diabetes complicated by peripheral neuropathy that presents to the clinic for [name] 96-monthhistory of nonhealing ulcer to the right foot. I have seen her before for [name] wound to the same area that was treated and healed. Her wound today  however this  is much deeper and it has thick yellow drainage. She is not sure exactly how it started. She noticed it 1 day. She has been following with [name] for this issue. She had [name] wound culture that showed [name] mix of organisms on 09/01/2021. She was recently started on doxycycline. She is using the Conservation officer, nature. She has been using Iodosorb with dressing changes. She currently denies systemic signs of infection. 8/21; patient presents for follow-up. She states she started and completed levofloxacin. She is still taking doxycycline. She reports improvement in drainage and odor. She has been doing Dakin's wet-to-dry dressings as well. She is using her front offloading shoe. She denies signs of infection. 8/29; patient presents for follow-up. She is still taking doxycycline. She continues to report improvement in drainage and reports no odor. She denies any purulent drainage. She has been doing silver alginate to the wound bed and using her front offloading shoe. 9/5; patient presents for follow-up. She completed the course of levofloxacin. She has someone that is able to drive her today so we can place the total contact cast. She reports no signs of infection. We have been doing silver alginate to the wound bed. 9/7; patient presents for follow-up. She has tolerated the total contact cast well. She presents for her obligatory cast change. She has no issues or complaints today. Electronic Signature(s) Signed: 10/28/2021 1:45:58 PM By: Kalman Shan DO Entered By: Kalman Shan on 10/28/2021 11:24:56 -------------------------------------------------------------------------------- Physical Exam Details Patient Name: Date of Service: [name], [name] 10/28/2021 10:45 [name] Medical Record Number: [id number] Patient Account Number: [id number] Date of Birth/Sex: Treating RN: [birth year] (66 y.o. F) Primary Care Provider: [name] Other Clinician: Referring Provider: Treating  Provider/Extender: [name] in Treatment: 3 Constitutional respirations regular, non-labored and within target range for patient.. Cardiovascular 2+ dorsalis pedis/posterior tibialis pulses. Psychiatric pleasant and cooperative. Notes Right foot: T the first submetatarsal there is an open wound with circumferential undermining. Granulation tissue noted. No probing to bone. No signs of o surrounding infection. Electronic Signature(s) Signed: 10/28/2021 1:45:58 PM By: Kalman Shan DO Entered By: Kalman Shan on 10/28/2021 11:26:51 -------------------------------------------------------------------------------- Physician Orders Details Patient Name: Date of Service: [name], [name] 10/28/2021 10:45 [name] Medical Record Number: [id number] Patient Account Number: [id number] Date of Birth/Sex: Treating RN: 22-Feb-1955 (66 y.o. [name], [name] Primary Care Provider: [name] Other Clinician: Referring Provider: Treating Provider/Extender: [name] in Treatment: 3 Verbal / Phone Orders: No Diagnosis Coding [name], [name] ([id number]) [id number] Page 3 of 9 ICD-10 Coding Code Description 364 [id number] 1171 Non-pressure 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 E11.621 Type 2 diabetes mellitus with foot ulcer E11.42 Type 2 diabetes mellitus with diabetic polyneuropathy Follow-up Appointments ppointment in 1 week. - Monday ****TCC CHANGE**** Return [name] Bathing/ Shower/ Hygiene May shower with protection but do not get wound dressing(s) wet. Edema Control - Lymphedema / SCD / Other Elevate legs to the level of the heart or above for 30 minutes daily and/or when sitting, [name] frequency of: Avoid standing for long periods of time. Off-Loading Total Contact Cast to Right Lower Extremity Wound Treatment Wound #4 - Metatarsal head first Wound Laterality:  Plantar, Right Cleanser: Soap and Water 2 x Per Day/15 Days Discharge Instructions: May shower and wash wound with dial antibacterial soap and water prior to dressing change. Cleanser: Wound Cleanser (Generic) 2 x Per LZJ/67 Days Discharge Instructions: Cleanse  the wound with wound cleanser prior to applying [name] clean dressing using gauze sponges, not tissue or cotton balls. Prim Dressing: Promogran Prisma Matrix, 4.34 (sq in) (silver collagen) 2 x Per Day/15 Days ary Discharge Instructions: Pack into undermining. Moisten collagen with saline or hydrogel Prim Dressing: Hydrofera Blue Ready Foam, 4x5 in 2 x Per Day/15 Days ary Discharge Instructions: Apply to wound bed as instructed Secondary Dressing: Optifoam Non-Adhesive Dressing, 4x4 in (Generic) 2 x Per Day/15 Days Discharge Instructions: Apply [name] foam donut over primary dressing as directed. Secondary Dressing: Woven Gauze Sponge, Non-Sterile 4x4 in (Generic) 2 x Per Day/15 Days Discharge Instructions: Apply over primary dressing as directed. Secured With: [employer], 4.5x3.1 (in/yd) (Generic) 2 x Per Day/15 Days Discharge Instructions: Secure with Kerlix as directed. Secured With: 37M Medipore H Soft Cloth Surgical T ape, 4 x 10 (in/yd) (Generic) 2 x Per Day/15 Days Discharge Instructions: Secure with tape as directed. Electronic Signature(s) Signed: 10/28/2021 3:23:06 PM By: Kalman Shan DO Signed: 11/29/2021 11:35:58 AM By: Rhae Hammock RN Entered By: Rhae Hammock on 10/28/2021 14:00:43 -------------------------------------------------------------------------------- Problem List Details Patient Name: Date of Service: [name], [name] 10/28/2021 10:45 [name] Medical Record Number: [id number] Patient Account Number: [id number] Date of Birth/Sex: Treating RN: [birth year] (66 y.o. F) Primary Care Provider: [name] Other Clinician: Referring Provider: Treating Provider/Extender: [name] in Treatment: 3 Active Problems ICD-10 Encounter Code Description Active Date MDM Diagnosis L97.512 Non-pressure 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 10/01/2021 No Yes [name], [name] Kaeli K ([id number]) [phone].pdf Page 4 of 9 E11.621 Type 2 diabetes mellitus with foot ulcer 10/01/2021 No Yes E11.42 Type 2 diabetes mellitus with diabetic polyneuropathy 10/01/2021 No Yes Inactive Problems Resolved Problems Electronic Signature(s) Signed: 10/28/2021 1:45:58 PM By: Kalman Shan DO Entered By: Kalman Shan on 10/28/2021 11:23:43 -------------------------------------------------------------------------------- Progress Note Details Patient Name: Date of Service: [place], [name] 10/28/2021 10:45 [name] Medical Record Number: [id number] Patient Account Number: [id number] Date of Birth/Sex: Treating RN: 1955-08-08 (66 y.o. F) Primary Care Provider: [name] Other Clinician: Referring Provider: Treating Provider/Extender: [name] in Treatment: 3 Subjective Chief Complaint Information obtained from Patient 10/27; Right Plantar foot wound 11/21; 2 small areas of skin breakdown to the right foot following cast placement 8/29; right plantar foot wound History of Present Illness (HPI) Admission 12/17/2020 [name] [name] 66 year old female with [name] past medical history of insulin-dependent type 2 diabetes, hypothyroidism and daily1 pack per day cigarette smoker the presents to the clinic for [name] 6-week history of nonhealing wound to the right first MTPJ. She has been following with [name], podiatry for this issue. She has been using silver alginate with dressing changes. She uses [name] postsurgical shoe and offloading pads. She currently denies signs of infection. 10/31; patient presents for follow-up. She tolerated the soft cast fine although she states that she felt her  foot rolling to one side. She denies signs of infection. She would like to do the total contact cast today. 12/23/2020 upon evaluation today patient appears to be doing excellent in regard to her wound on the foot and she is in [name] total contact cast. I do think this is appropriate this is the first cast change which we are obliged to do to ensure nothing is rubbing everything appears to be doing quite well and very pleased in that regard. 11/7; patient presents for follow-up.  She had no issues with the cast. She denies signs of infection. 11/14; patient presents for 1 week follow-up. She has had no issues with the cast. She denies signs of infection. 11/21; patient presents for 1 week follow-up. She did develop 2 small areas of skin breakdown on either side of the right foot from the cast rubbing. She states she did not feel the wounds developing. She currently denies signs of infection. 11/28; patient presents for 1 week follow-up. She has no issues or complaints today. She denies pain or acute signs of infection. 12/12; patient presents for follow-up. She reports improvement to her right lateral foot wound. She has been using silver alginate to the area. She has no issues or complaints today. 12/19; patient presents for follow-up. She reports that her right lateral foot wound has healed. She has no issues or complaints today. 1/3; patient presents for follow-up. She has no issues or complaints today. She reports no open wounds. Readmission 10/01/2021 [name] [name] 66 year old female's with [name] past medical history of insulin-dependent controlled type 2 diabetes complicated by peripheral neuropathy that presents to the clinic for [name] 41-monthhistory of nonhealing ulcer to the right foot. I have seen her before for [name] wound to the same area that was treated and healed. Her wound today however this is much deeper and it has thick yellow drainage. She is not sure exactly how it started. She noticed  it 1 day. She has been following with Dr. EAmalia Haileyfor this issue. She had [name] wound culture that showed [name] mix of organisms on 09/01/2021. She was recently started on doxycycline. She is using the dConservation officer, nature She has been using Iodosorb with dressing changes. She currently denies systemic signs of infection. 8/21; patient presents for follow-up. She states she started and completed levofloxacin. She is still taking doxycycline. She reports improvement in drainage [name], VERSTEEGK ([id number] Page 5 of 9 and odor. She has been doing Dakin's wet-to-dry dressings as well. She is using her front offloading shoe. She denies signs of infection. 8/29; patient presents for follow-up. She is still taking doxycycline. She continues to report improvement in drainage and reports no odor. She denies any purulent drainage. She has been doing silver alginate to the wound bed and using her front offloading shoe. 9/5; patient presents for follow-up. She completed the course of levofloxacin. She has someone that is able to drive her today so we can place the total contact cast. She reports no signs of infection. We have been doing silver alginate to the wound bed. 9/7; patient presents for follow-up. She has tolerated the total contact cast well. She presents for her obligatory cast change. She has no issues or complaints today. Patient History Information obtained from Patient, Chart. Family History Unknown History. Social History Current every day smoker - 1 pack/[name]/day, Alcohol Use - Never, Drug Use - No History, Caffeine Use - Moderate. Medical History Eyes Denies history of Cataracts, Glaucoma, Optic Neuritis Ear/Nose/Mouth/Throat Denies history of Chronic sinus problems/congestion, Middle ear problems Hematologic/Lymphatic Denies history of Anemia, Hemophilia, Human Immunodeficiency Virus, Lymphedema, Sickle Cell Disease Respiratory Patient has history of Sleep  Apnea Denies history of Aspiration, Asthma, Chronic Obstructive Pulmonary Disease (COPD), Pneumothorax, Tuberculosis Cardiovascular Patient has history of Coronary Artery Disease, Hypertension Denies history of Angina, Arrhythmia, Congestive Heart Failure, Deep Vein Thrombosis, Hypotension, Myocardial Infarction, Peripheral Arterial Disease, Peripheral Venous Disease, Phlebitis, Vasculitis Gastrointestinal Denies history of Cirrhosis , Colitis, Crohnoos, Hepatitis [name], Hepatitis B, Hepatitis C Endocrine  Patient has history of Type II Diabetes Denies history of Type I Diabetes Genitourinary Denies history of End Stage Renal Disease Immunological Denies history of Lupus Erythematosus, Raynaudoos, Scleroderma Integumentary (Skin) Denies history of History of Burn Musculoskeletal Denies history of Gout, Rheumatoid Arthritis, Osteoarthritis, Osteomyelitis Neurologic Patient has history of Neuropathy Denies history of Dementia, Quadriplegia, Paraplegia, Seizure Disorder Hospitalization/Surgery History - 2nd and 3rd right toe amps. - total hysterectomy. - cholecystectomy. - 2 knee surgery's on left and 1 on right. Medical [name] Surgical History Notes nd Cardiovascular hypercholesterolemia Objective Constitutional respirations regular, non-labored and within target range for patient.. Vitals Time Taken: 11:00 AM, Temperature: 98.7 F, Pulse: 74 bpm, Respiratory Rate: 17 breaths/min, Blood Pressure: 130/70 mmHg. Cardiovascular 2+ dorsalis pedis/posterior tibialis pulses. Psychiatric pleasant and cooperative. General Notes: Right foot: T the first submetatarsal there is an open wound with circumferential undermining. Granulation tissue noted. No probing to bone. No o signs of surrounding infection. Integumentary (Hair, Skin) Wound #4 status is Open. Original cause of wound was Gradually Appeared. The date acquired was: 07/22/2021. The wound has been in treatment 3 weeks. The wound is located  on the Right,Plantar Metatarsal head first. The wound measures 1cm length x 1cm width x 0.5cm depth; 0.785cm^2 area and 0.393cm^3 volume. There is Fat Layer (Subcutaneous Tissue) exposed. There is no tunneling noted, however, there is undermining starting at 12:00 and ending at 12:00 with [name] maximum distance of 0.5cm. There is [name] large amount of serosanguineous drainage noted. The wound margin is distinct with the outline attached to the [name], [name] ([id number]) [phone].pdf Page 6 of 9 wound base. There is large (67-100%) red, pink granulation within the wound bed. There is [name] small (1-33%) amount of necrotic tissue within the wound bed including Adherent Slough. Assessment Active Problems ICD-10 Non-pressure 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 Type 2 diabetes mellitus with foot ulcer Type 2 diabetes mellitus with diabetic polyneuropathy Patient presents for obligatory cast change. She has done well over the past 2 days. She has no issues or complaints. I recommended collagen to the undermining areas and continue Hydrofera Blue to the remaining wound bed. No signs of surrounding infection. Follow-up in 1 week. Plan 1. Collagen and Hydrofera Blue 2. T contact cast placed in standard fashion otal Electronic Signature(s) Signed: 10/28/2021 1:45:58 PM By: Kalman Shan DO Entered By: Kalman Shan on 10/28/2021 11:27:42 -------------------------------------------------------------------------------- HxROS Details Patient Name: Date of Service: [place], [name] 10/28/2021 10:45 [name] Medical Record Number: [id number] Patient Account Number: [id number] Date of Birth/Sex: Treating RN: Jul 26, 1955 (66 y.o. F) Primary Care Provider: [name] Other Clinician: Referring Provider: Treating Provider/Extender: [name] in Treatment: 3 Information Obtained From Patient Chart Eyes Medical  History: Negative for: Cataracts; Glaucoma; Optic Neuritis Ear/Nose/Mouth/Throat Medical History: Negative for: Chronic sinus problems/congestion; Middle ear problems Hematologic/Lymphatic Medical History: Negative for: Anemia; Hemophilia; Human Immunodeficiency Virus; Lymphedema; Sickle Cell Disease Respiratory Medical History: Positive for: Sleep Apnea Negative for: Aspiration; Asthma; Chronic Obstructive Pulmonary Disease (COPD); Pneumothorax; Tuberculosis Cardiovascular Medical History: [name], [name] ([id number]) [id number] Page 7 of 9 Positive for: Coronary Artery Disease; Hypertension Negative for: Angina; Arrhythmia; Congestive Heart Failure; Deep Vein Thrombosis; Hypotension; Myocardial Infarction; Peripheral Arterial Disease; Peripheral Venous Disease; Phlebitis; Vasculitis Past Medical History Notes: hypercholesterolemia Gastrointestinal Medical History: Negative for: Cirrhosis ; Colitis; Crohns; Hepatitis [name]; Hepatitis B; Hepatitis C Endocrine Medical History: Positive for: Type II Diabetes Negative for: Type I Diabetes Genitourinary Medical  History: Negative for: End Stage Renal Disease Immunological Medical History: Negative for: Lupus Erythematosus; Raynauds; Scleroderma Integumentary (Skin) Medical History: Negative for: History of Burn Musculoskeletal Medical History: Negative for: Gout; Rheumatoid Arthritis; Osteoarthritis; Osteomyelitis Neurologic Medical History: Positive for: Neuropathy Negative for: Dementia; Quadriplegia; Paraplegia; Seizure Disorder Immunizations Pneumococcal Vaccine: Received Pneumococcal Vaccination: Yes Received Pneumococcal Vaccination On or After 60th Birthday: Yes Tetanus Vaccine: Last tetanus shot: 12/17/2020 Implantable Devices No devices added Hospitalization / Surgery History Type of Hospitalization/Surgery 2nd and 3rd right toe amps total hysterectomy cholecystectomy 2 knee  surgery's on left and 1 on right Family and Social History Unknown History: Yes; Current every day smoker - 1 pack/[name]/day; Alcohol Use: Never; Drug Use: No History; Caffeine Use: Moderate; Financial Concerns: No; Food, Clothing or Shelter Needs: No; Support System Lacking: No; Transportation Concerns: No Electronic Signature(s) Signed: 10/28/2021 1:45:58 PM By: Kalman Shan DO Entered By: Kalman Shan on 10/28/2021 11:26:21 [name], [name] K ([id number]) [id number] Page 8 of 9 -------------------------------------------------------------------------------- Total Contact Cast Details Patient Name: Date of Service: [name] [name] May [name] 10/28/2021 10:45 [name] Medical Record Number: [id number] Patient Account Number: [id number] Date of Birth/Sex: Treating RN: 1956/02/20 (66 y.o. [name], [name] Primary Care Provider: [name] Other Clinician: Referring Provider: Treating Provider/Extender: [name] in Treatment: 3 T Contact Cast Applied for Wound Assessment: otal Wound #4 Right,Plantar Metatarsal head first Performed By: Physician Kalman Shan, DO Post Procedure Diagnosis Same as Pre-procedure Electronic Signature(s) Signed: 10/28/2021 1:58:11 PM By: Kalman Shan DO Signed: 11/29/2021 11:35:58 AM By: Rhae Hammock RN Entered By: Rhae Hammock on 10/28/2021 13:55:37 -------------------------------------------------------------------------------- SuperBill Details Patient Name: Date of Service: [name], [name] 10/28/2021 Medical Record Number: [id number] Patient Account Number: [id number] Date of Birth/Sex: Treating RN: 12-Mar-1955 (66 y.o. F) Primary Care Provider: [name] Other Clinician: Referring Provider: Treating Provider/Extender: [name] in Treatment: 3 Diagnosis Coding ICD-10 Codes Code Description [id number] Non-pressure chronic  ulcer of other part of right foot with fat layer exposed E11.621 Type 2 diabetes mellitus with foot ulcer E11.42 Type 2 diabetes mellitus with diabetic polyneuropathy Facility Procedures : CPT4 Code: [id number] Description: 29445 - APPLY TOTAL CONTACT LEG CAST ICD-10 Diagnosis Description L97.512 Non-pressure 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 E11.621 Type 2 diabetes mellitus with foot ulcer Modifier: Quantity: 1 Physician Procedures : CPT4 Code Description Modifier [id number] - WC PHYS APPLY TOTAL CONTACT CAST ICD-10 Diagnosis Description L97.512 Non-pressure 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 E11.621 Type 2 diabetes mellitus with foot ulcer Quantity: 1 Electronic Signature(s) Signed: 10/28/2021 3:23:06 PM By: Kalman Shan DO Signed: 11/29/2021 11:35:58 AM By: Rhae Hammock RN Previous Signature: 10/28/2021 1:45:58 PM Version By: Chiquita Loth, Crooks ([id number]) [id number] Page 9 of 9 Entered By: Rhae Hammock on 10/28/2021 14:03:07

## 2021-11-01 ENCOUNTER — Encounter (HOSPITAL_BASED_OUTPATIENT_CLINIC_OR_DEPARTMENT_OTHER): Payer: Medicare Other | Admitting: Internal Medicine

## 2021-11-01 DIAGNOSIS — L97512 Non-pressure 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: Secondary | ICD-10-CM

## 2021-11-01 DIAGNOSIS — E1142 Type 2 diabetes mellitus with diabetic polyneuropathy: Secondary | ICD-10-CM | POA: Diagnosis not present

## 2021-11-01 DIAGNOSIS — E11621 Type 2 diabetes mellitus with foot ulcer: Secondary | ICD-10-CM | POA: Diagnosis not present

## 2021-11-01 NOTE — Progress Notes (Signed)
SANTOS, HARDWICK ([id number]) Visit Report for 11/01/2021 Arrival Information Details Patient Name: Date of Service: [name], [name] 11/01/2021 1:45 PM Medical Record Number: [id number] Patient Account Number: [id number] Date of Birth/Sex: Treating RN: June 07, 1955 (66 y.o. [name], [name] Primary Care Phil Corti: [name] Other Clinician: Referring Safia Panzer: Treating Vincie Linn/Extender: [name] in Treatment: 4 Visit Information History Since Last Visit Added or deleted any medications: No Patient Arrived: Ambulatory Any new allergies or adverse reactions: No Arrival Time: 13:54 Had a fall or experienced change in No Accompanied By: self activities of daily living that may affect Transfer Assistance: None risk of falls: Patient Identification Verified: Yes Signs or symptoms of abuse/neglect since last visito No Secondary Verification Process Completed: Yes Hospitalized since last visit: No Patient Requires Transmission-Based Precautions: No Implantable device outside of the clinic excluding No Patient Has Alerts: No cellular tissue based products placed in the center since last visit: Has Dressing in Place as Prescribed: Yes Has Footwear/Offloading in Place as Prescribed: Yes Right: T Contact Cast otal Pain Present Now: No Electronic Signature(s) Signed: 11/01/2021 5:26:50 PM By: Deon Pilling RN, BSN Entered By: Deon Pilling on 11/01/2021 13:54:43 -------------------------------------------------------------------------------- Encounter Discharge Information Details Patient Name: Date of Service: [name], [name] 11/01/2021 1:45 PM Medical Record Number: [id number] Patient Account Number: [id number] Date of Birth/Sex: Treating RN: 11/26/1955 (66 y.o. [name], [name] Primary Care Zoltan Genest: [name] Other Clinician: Referring Taylon Coole: Treating [name]/Extender: [name]  in Treatment: 4 Encounter Discharge Information Items Post Procedure Vitals Discharge Condition: Stable Temperature (F): 97.8 Ambulatory Status: Ambulatory Pulse (bpm): 95 Discharge Destination: Home Respiratory Rate (breaths/min): 20 Transportation: Private Auto Blood Pressure (mmHg): 152/68 Accompanied By: self Schedule Follow-up Appointment: Yes Clinical Summary of Care: Electronic Signature(s) Signed: 11/01/2021 5:26:50 PM By: Deon Pilling RN, BSN Entered By: Deon Pilling on 11/01/2021 14:16:29 -------------------------------------------------------------------------------- Lower Extremity Assessment Details Patient Name: Date of Service: [name], [name] 11/01/2021 1:45 PM Medical Record Number: [id number] Patient Account Number: [id number] Date of Birth/Sex: Treating RN: 19-Feb-1956 (66 y.o. [name], [name] Primary Care Maryori Weide: [name] Other Clinician: Referring Candus Braud: Treating [name]/Extender: [name] in Treatment: 4 Edema Assessment Assessed: [name]: No] [Right: Yes] Edema: [Left: Ye] [Right: s] Calf Left: Right: Point of Measurement: From Medial Instep 44 cm Ankle Left: Right: Point of Measurement: From Medial Instep 25 cm Vascular Assessment Pulses: Dorsalis Pedis Palpable: [Right:Yes] Electronic Signature(s) Signed: 11/01/2021 5:26:50 PM By: Deon Pilling RN, BSN Entered By: Deon Pilling on 11/01/2021 14:00:47 -------------------------------------------------------------------------------- Multi Wound Chart Details Patient Name: Date of Service: [name], [name] 11/01/2021 1:45 PM Medical Record Number: [id number] Patient Account Number: [id number] Date of Birth/Sex: Treating RN: 1955/09/16 (66 y.o. F) Primary Care Hilman Kissling: [name] Other Clinician: Referring Westly Hinnant: Treating Loran Fleet/Extender: [name] in Treatment: 4 Vital  Signs Height(in): Pulse(bpm): 95 Weight(lbs): Blood Pressure(mmHg): 152/68 Body Mass Index(BMI): Temperature(F): 97.8 Respiratory Rate(breaths/min): 20 Photos: [4:Right, Plantar Metatarsal head first] [N/A:N/A N/A] Wound Location: [4:Gradually Appeared] [N/A:N/A] Wounding Event: [4:Diabetic Wound/Ulcer of the Lower] [N/A:N/A] Primary Etiology: [4:Extremity Sleep Apnea, Coronary Artery] [N/A:N/A] Comorbid History: [4:Disease, Hypertension, Type II Diabetes, Neuropathy 07/22/2021] [N/A:N/A] Date Acquired: [4:4] [N/A:N/A] Weeks of Treatment: [4:Open] [N/A:N/A] Wound Status: [4:No] [N/A:N/A] Wound Recurrence: [4:Yes] [N/A:N/A] Pending A mputation on Presentation: [4:1x1.1x0.5] [N/A:N/A] Measurements L x W x D (cm) [4:0.864] [N/A:N/A] A (cm) : rea [4:0.432] [N/A:N/A] Volume (  cm) : [4:-1116.90%] [N/A:N/A] % Reduction in A rea: [4:-176.90%] [N/A:N/A] % Reduction in Volume: [4:12] Starting Position 1 (o'clock): [4:12] Ending Position 1 (o'clock): [4:0.4] Maximum Distance 1 (cm): [4:Yes] [N/A:N/A] Undermining: [4:Grade 2] [N/A:N/A] Classification: [4:Large] [N/A:N/A] Exudate A mount: [4:Serosanguineous] [N/A:N/A] Exudate Type: [4:red, brown] [N/A:N/A] Exudate Color: [4:Distinct, outline attached] [N/A:N/A] Wound Margin: [4:Large (67-100%)] [N/A:N/A] Granulation A mount: [4:Red, Pink, Friable] [N/A:N/A] Granulation Quality: [4:None Present (0%)] [N/A:N/A] Necrotic A mount: [4:Fat Layer (Subcutaneous Tissue): Yes N/A] Exposed Structures: [4:Fascia: No Tendon: No Muscle: No Joint: No Bone: No None] [N/A:N/A] Epithelialization: [4:Debridement - Excisional] [N/A:N/A] Debridement: Pre-procedure Verification/Time Out 14:05 [N/A:N/A] Taken: [4:Lidocaine 5% topical ointment] [N/A:N/A] Pain Control: [4:Subcutaneous, Slough] [N/A:N/A] Tissue Debrided: [4:Skin/Subcutaneous Tissue] [N/A:N/A] Level: [4:4] [N/A:N/A] Debridement A (sq cm): [4:rea Curette] [N/A:N/A] Instrument: [4:Minimum]  [N/A:N/A] Bleeding: [4:Pressure] [N/A:N/A] Hemostasis A chieved: [4:0] [N/A:N/A] Procedural Pain: [4:0] [N/A:N/A] Post Procedural Pain: [4:Procedure was tolerated well] [N/A:N/A] Debridement Treatment Response: [4:1x1.1x0.5] [N/A:N/A] Post Debridement Measurements L x W x D (cm) [4:0.432] [N/A:N/A] Post Debridement Volume: (cm) [4:Debridement] [N/A:N/A] Procedures Performed: [4:T Contact Cast otal] Treatment Notes Wound #4 (Metatarsal head first) Wound Laterality: Plantar, Right Cleanser Soap and Water Discharge Instruction: May shower and wash wound with dial antibacterial soap and water prior to dressing change. Wound Cleanser Discharge Instruction: Cleanse the wound with wound cleanser prior to applying a clean dressing using gauze sponges, not tissue or cotton balls. Peri-Wound Care Topical Primary Dressing Maxorb Extra Calcium Alginate 2x2 in Discharge Instruction: Apply calcium alginate over sorbact. Cutimed Sorbact Swab Discharge Instruction: Apply to wound bed pack into undermining. Cover with alginate. Secondary Dressing Optifoam Non-Adhesive Dressing, 4x4 in Discharge Instruction: Apply a foam donut one layer over primary dressing as directed. Woven Gauze Sponge, Non-Sterile 4x4 in Discharge Instruction: Apply over primary dressing as directed. Secured With [employer], 4.5x3.1 (in/yd) Discharge Instruction: Secure with Kerlix as directed. 20M Medipore H Soft Cloth Surgical T ape, 4 x 10 (in/yd) Discharge Instruction: Secure with tape as directed. Compression Wrap Compression Stockings Add-Ons Electronic Signature(s) Signed: 11/01/2021 3:05:51 PM By: Kalman Shan DO Entered By: Kalman Shan on 11/01/2021 14:49:49 -------------------------------------------------------------------------------- Multi-Disciplinary Care Plan Details Patient Name: Date of Service: [name], [name] 11/01/2021 1:45 PM Medical Record Number: [id number] Patient  Account Number: [id number] Date of Birth/Sex: Treating RN: 05-30-1955 (66 y.o. [name], [name] Primary Care Thaddus Mcdowell: [name] Other Clinician: Referring Anthem Frazer: Treating Cloyce Blankenhorn/Extender: [name] in Treatment: 4 Active Inactive Wound/Skin Impairment Nursing Diagnoses: Impaired tissue integrity Knowledge deficit related to ulceration/compromised skin integrity Goals: Patient will have a decrease in wound volume by X% from date: (specify in notes) Date Initiated: 10/01/2021 Target Resolution Date: 11/20/2021 Goal Status: Active Patient/caregiver will verbalize understanding of skin care regimen Date Initiated: 10/01/2021 Target Resolution Date: 11/20/2021 Goal Status: Active Ulcer/skin breakdown will have a volume reduction of 30% by week 4 Date Initiated: 10/01/2021 Target Resolution Date: 11/20/2021 Goal Status: Active Interventions: Assess patient/caregiver ability to obtain necessary supplies Assess patient/caregiver ability to perform ulcer/skin care regimen upon admission and as needed Assess ulceration(s) every visit Notes: Electronic Signature(s) Signed: 11/01/2021 5:26:50 PM By: Deon Pilling RN, BSN Entered By: Deon Pilling on 11/01/2021 14:04:40 -------------------------------------------------------------------------------- Pain Assessment Details Patient Name: Date of Service: [name], [name] 11/01/2021 1:45 PM Medical Record Number: [id number] Patient Account Number: [id number] Date of Birth/Sex: Treating RN: [birth year] (66 y.o. [name], [name] Primary Care Seleni Meller: [name] Other Clinician: Referring Johnathyn Viscomi: Treating Maedell Hedger/Extender: Kalman Shan  [name] Weeks in Treatment: 4 Active Problems Location of Pain Severity and Description of Pain Patient Has Paino No Site Locations Rate the pain. Current Pain Level: 0 Pain Management and Medication Current Pain  Management: Medication: No Cold Application: No Rest: No Massage: No Activity: No T.E.N.S.: No Heat Application: No Leg drop or elevation: No Is the Current Pain Management Adequate: Adequate How does your wound impact your activities of daily livingo Sleep: No Bathing: No Appetite: No Relationship With Others: No Bladder Continence: No Emotions: No Bowel Continence: No Work: No Toileting: No Drive: No Dressing: No Hobbies: No Engineer, maintenance) Signed: 11/01/2021 5:26:50 PM By: Deon Pilling RN, BSN Entered By: Deon Pilling on 11/01/2021 13:54:55 -------------------------------------------------------------------------------- Patient/Caregiver Education Details Patient Name: Date of Service: [name], A DRIENNE Raliegh Ip 9/11/2023andnbsp1:45 PM Medical Record Number: [id number] Patient Account Number: [id number] Date of Birth/Gender: Treating RN: 06/12/1955 (66 y.o. [name], [name] Primary Care Physician: [name] Other Clinician: Referring Physician: Treating Physician/Extender: [name] in Treatment: 4 Education Assessment Education Provided To: Patient Education Topics Provided Wound/Skin Impairment: Handouts: Skin Care Do's and Dont's Methods: Explain/Verbal Responses: Reinforcements needed Electronic Signature(s) Signed: 11/01/2021 5:26:50 PM By: Deon Pilling RN, BSN Entered By: Deon Pilling on 11/01/2021 14:04:52 -------------------------------------------------------------------------------- Wound Assessment Details Patient Name: Date of Service: [name], [name] 11/01/2021 1:45 PM Medical Record Number: [id number] Patient Account Number: [id number] Date of Birth/Sex: Treating RN: 1955-05-23 (66 y.o. [name], [name] Primary Care Pacen Watford: [name] Other Clinician: Referring Wiatt Mahabir: Treating Lacrisha Bielicki/Extender: [name] in Treatment: 4 Wound  Status Wound Number: 4 Primary Diabetic Wound/Ulcer of the Lower Extremity Etiology: Wound Location: Right, Plantar Metatarsal head first Wound Open Wounding Event: Gradually Appeared Status: Date Acquired: 07/22/2021 Comorbid Sleep Apnea, Coronary Artery Disease, Hypertension, Type II Weeks Of Treatment: 4 History: Diabetes, Neuropathy Clustered Wound: No Pending Amputation On Presentation Photos Wound Measurements Length: (cm) 1 Width: (cm) 1.1 Depth: (cm) 0.5 Area: (cm) 0.864 Volume: (cm) 0.432 % Reduction in Area: -1116.9% % Reduction in Volume: -176.9% Epithelialization: None Tunneling: No Undermining: Yes Starting Position (o'clock): 12 Ending Position (o'clock): 12 Maximum Distance: (cm) 0.4 Wound Description Classification: Grade 2 Wound Margin: Distinct, outline attached Exudate Amount: Large Exudate Type: Serosanguineous Exudate Color: red, brown Foul Odor After Cleansing: No Slough/Fibrino No Wound Bed Granulation Amount: Large (67-100%) Exposed Structure Granulation Quality: Red, Pink, Friable Fascia Exposed: No Necrotic Amount: None Present (0%) Fat Layer (Subcutaneous Tissue) Exposed: Yes Tendon Exposed: No Muscle Exposed: No Joint Exposed: No Bone Exposed: No Treatment Notes Wound #4 (Metatarsal head first) Wound Laterality: Plantar, Right Cleanser Soap and Water Discharge Instruction: May shower and wash wound with dial antibacterial soap and water prior to dressing change. Wound Cleanser Discharge Instruction: Cleanse the wound with wound cleanser prior to applying a clean dressing using gauze sponges, not tissue or cotton balls. Peri-Wound Care Topical Primary Dressing Maxorb Extra Calcium Alginate 2x2 in Discharge Instruction: Apply calcium alginate over sorbact. Cutimed Sorbact Swab Discharge Instruction: Apply to wound bed pack into undermining. Cover with alginate. Secondary Dressing Optifoam Non-Adhesive Dressing, 4x4 in Discharge  Instruction: Apply a foam donut one layer over primary dressing as directed. Woven Gauze Sponge, Non-Sterile 4x4 in Discharge Instruction: Apply over primary dressing as directed. Secured With [employer], 4.5x3.1 (in/yd) Discharge Instruction: Secure with Kerlix as directed. 11M Medipore H Soft Cloth Surgical T ape, 4 x 10 (in/yd) Discharge Instruction: Secure with tape as directed.  Compression Wrap Compression Stockings Add-Ons Electronic Signature(s) Signed: 11/01/2021 5:26:50 PM By: Deon Pilling RN, BSN Entered By: Deon Pilling on 11/01/2021 14:06:49 -------------------------------------------------------------------------------- Vitals Details Patient Name: Date of Service: [name], [name] 11/01/2021 1:45 PM Medical Record Number: [id number] Patient Account Number: [id number] Date of Birth/Sex: Treating RN: [birth year] (66 y.o. [name] Primary Care Masiya Claassen: [name] Other Clinician: Referring Adi Seales: Treating Reniya Mcclees/Extender: [name] in Treatment: 4 Vital Signs Time Taken: 13:55 Temperature (F): 97.8 Pulse (bpm): 95 Respiratory Rate (breaths/min): 20 Blood Pressure (mmHg): 152/68 Reference Range: 80 - 120 mg / dl Electronic Signature(s) Signed: 11/01/2021 5:26:50 PM By: Deon Pilling RN, BSN Entered By: Deon Pilling on 11/01/2021 13:56:11

## 2021-11-01 NOTE — Progress Notes (Signed)
[name], [name] ([id number]) Visit Report for 11/01/2021 Chief Complaint Document Details Patient Name: Date of Service: [name] [name], [name] 11/01/2021 1:45 PM Medical Record Number: [id number] Patient Account Number: [id number] Date of Birth/Sex: Treating RN: 1955-12-21 (66 y.o. F) Primary Care Provider: [name] Other Clinician: Referring Provider: Treating Provider/Extender: [name] in Treatment: 4 Information Obtained from: Patient Chief Complaint 10/27; Right Plantar foot wound 11/21; 2 small areas of skin breakdown to the right foot following cast placement 8/29; right plantar foot wound Electronic Signature(s) Signed: 11/01/2021 3:05:51 PM By: Kalman Shan DO Entered By: Kalman Shan on 11/01/2021 14:49:57 -------------------------------------------------------------------------------- Debridement Details Patient Name: Date of Service: [name] [name], [name] 11/01/2021 1:45 PM Medical Record Number: [id number] Patient Account Number: [id number] Date of Birth/Sex: Treating RN: 1956/01/18 (66 y.o. [name], [name] Primary Care Provider: [name] Other Clinician: Referring Provider: Treating Provider/Extender: [name] in Treatment: 4 Debridement Performed for Assessment: Wound #4 Right,Plantar Metatarsal head first Performed By: Physician Kalman Shan, DO Debridement Type: Debridement Severity of Tissue Pre Debridement: Fat layer exposed Level of Consciousness (Pre-procedure): Awake and Alert Pre-procedure Verification/Time Out Yes - 14:05 Taken: Start Time: 14:06 Pain Control: Lidocaine 5% topical ointment T Area Debrided (L x W): otal 2 (cm) x 2 (cm) = 4 (cm) Tissue and other material debrided: Viable, Non-Viable, Slough, Subcutaneous, Skin: Dermis , Skin: Epidermis, Slough Level: Skin/Subcutaneous Tissue Debridement Description: Excisional Instrument:  Curette Bleeding: Minimum Hemostasis Achieved: Pressure End Time: 14:14 Procedural Pain: 0 Post Procedural Pain: 0 Response to Treatment: Procedure was tolerated well Level of Consciousness (Post- Awake and Alert procedure): Post Debridement Measurements of Total Wound Length: (cm) 1 Width: (cm) 1.1 Depth: (cm) 0.5 Volume: (cm) 0.432 Character of Wound/Ulcer Post Debridement: Improved Severity of Tissue Post Debridement: Fat layer exposed Post Procedure Diagnosis Same as Pre-procedure Electronic Signature(s) Signed: 11/01/2021 3:05:51 PM By: Kalman Shan DO Signed: 11/01/2021 5:26:50 PM By: Deon Pilling RN, BSN Entered By: Deon Pilling on 11/01/2021 14:14:32 -------------------------------------------------------------------------------- HPI Details Patient Name: Date of Service: [name] [name], [name] 11/01/2021 1:45 PM Medical Record Number: [id number] Patient Account Number: [id number] Date of Birth/Sex: Treating RN: [birth year] (66 y.o. F) Primary Care Provider: [name] Other Clinician: Referring Provider: Treating Provider/Extender: [name] in Treatment: 4 History of Present Illness HPI Description: Admission 12/17/2020 [name] a 66 year old female with a past medical history of insulin-dependent type 2 diabetes, hypothyroidism and daily1 pack per day cigarette smoker the presents to the clinic for a 6-week history of nonhealing wound to the right first MTPJ. She has been following with [name], podiatry for this issue. She has been using silver alginate with dressing changes. She uses a postsurgical shoe and offloading pads. She currently denies signs of infection. 10/31; patient presents for follow-up. She tolerated the soft cast fine although she states that she felt her foot rolling to one side. She denies signs of infection. She would like to do the total contact cast today. 12/23/2020 upon evaluation  today patient appears to be doing excellent in regard to her wound on the foot and she is in a total contact cast. I do think this is appropriate this is the first cast change which we are obliged to do to ensure nothing is rubbing everything appears to be doing quite well and very pleased in that regard. 11/7; patient presents for follow-up. She had no issues  with the cast. She denies signs of infection. 11/14; patient presents for 1 week follow-up. She has had no issues with the cast. She denies signs of infection. 11/21; patient presents for 1 week follow-up. She did develop 2 small areas of skin breakdown on either side of the right foot from the cast rubbing. She states she did not feel the wounds developing. She currently denies signs of infection. 11/28; patient presents for 1 week follow-up. She has no issues or complaints today. She denies pain or acute signs of infection. 12/12; patient presents for follow-up. She reports improvement to her right lateral foot wound. She has been using silver alginate to the area. She has no issues or complaints today. 12/19; patient presents for follow-up. She reports that her right lateral foot wound has healed. She has no issues or complaints today. 1/3; patient presents for follow-up. She has no issues or complaints today. She reports no open wounds. Readmission 10/01/2021 [name] a 66 year old female's with a past medical history of insulin-dependent controlled type 2 diabetes complicated by peripheral neuropathy that presents to the clinic for a 49-monthhistory of nonhealing ulcer to the right foot. I have seen her before for a wound to the same area that was treated and healed. Her wound today however this is much deeper and it has thick yellow drainage. She is not sure exactly how it started. She noticed it 1 day. She has been following with Dr. EAmalia Haileyfor this issue. She had a wound culture that showed a mix of organisms on 09/01/2021.  She was recently started on doxycycline. She is using the dConservation officer, nature She has been using Iodosorb with dressing changes. She currently denies systemic signs of infection. 8/21; patient presents for follow-up. She states she started and completed levofloxacin. She is still taking doxycycline. She reports improvement in drainage and odor. She has been doing Dakin's wet-to-dry dressings as well. She is using her front offloading shoe. She denies signs of infection. 8/29; patient presents for follow-up. She is still taking doxycycline. She continues to report improvement in drainage and reports no odor. She denies any purulent drainage. She has been doing silver alginate to the wound bed and using her front offloading shoe. 9/5; patient presents for follow-up. She completed the course of levofloxacin. She has someone that is able to drive her today so we can place the total contact cast. She reports no signs of infection. We have been doing silver alginate to the wound bed. 9/7; patient presents for follow-up. She has tolerated the total contact cast well. She presents for her obligatory cast change. She has no issues or complaints today. 9/11; patient presents for follow-up. She tolerated the total contact cast well. She has no issues or complaints today. We discussed potentially doing a skin substitute and patient would like to see if her insurance will cover this. Electronic Signature(s) Signed: 11/01/2021 3:05:51 PM By: HKalman ShanDO Entered By: HKalman Shanon 11/01/2021 14:50:33 -------------------------------------------------------------------------------- Physical Exam Details Patient Name: Date of Service: [name] [name], [name] 11/01/2021 1:45 PM Medical Record Number: [id number] Account Number: [id number] of Birth/Sex: Treating RN: 1Nov 24, 1957((66y.o. F) Primary Care Provider: AGeoffery LyonsOther Clinician: Referring Provider: Treating Provider/Extender:  HBaird Kayin Treatment: 4 Constitutional respirations regular, non-labored and within target range for patient.. Cardiovascular 2+ dorsalis pedis/posterior tibialis pulses. Psychiatric pleasant and cooperative. Notes Right foot: T the first submetatarsal there is an open wound with  circumferential undermining. Granulation tissue and non viable tissue noted. No probing to o bone. mild maceration to the periwound. No signs of surrounding infection. Electronic Signature(s) Signed: 11/01/2021 3:05:51 PM By: Kalman Shan DO Entered By: Kalman Shan on 11/01/2021 14:52:04 -------------------------------------------------------------------------------- Physician Orders Details Patient Name: Date of Service: [name] [name], [name] 11/01/2021 1:45 PM Medical Record Number: [id number] Patient Account Number: [id number] Date of Birth/Sex: Treating RN: 04/09/1955 (66 y.o. [name], [name] Primary Care Provider: [name] Other Clinician: Referring Provider: Treating Provider/Extender: [name] in Treatment: 4 Verbal / Phone Orders: No Diagnosis Coding ICD-10 Coding Code Description [phone] Non-pressure 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 E11.621 Type 2 diabetes mellitus with foot ulcer E11.42 Type 2 diabetes mellitus with diabetic polyneuropathy Follow-up Appointments ppointment in 1 week. - Monday ****TCC CHANGE**** Dr. Heber [place] and Allayne Butcher, Room 9 Return A Anesthetic (In clinic) Topical Lidocaine 5% applied to wound bed Cellular or Tissue Based Products Wound #4 Right,Plantar Metatarsal head first Cellular or Tissue Based Product Type: - run insurance auth for organogenesis products and Grafix. Bathing/ Shower/ Hygiene May shower with protection but do not get wound dressing(s) wet. Edema Control - Lymphedema / SCD / Other Elevate legs to the level of the heart or above for  30 minutes daily and/or when sitting, a frequency of: Avoid standing for long periods of time. Off-Loading Total Contact Cast to Right Lower Extremity Wound Treatment Wound #4 - Metatarsal head first Wound Laterality: Plantar, Right Cleanser: Soap and Water 2 x Per Day/15 Days Discharge Instructions: May shower and wash wound with dial antibacterial soap and water prior to dressing change. Cleanser: Wound Cleanser (Generic) 2 x Per Day/15 Days Discharge Instructions: Cleanse the wound with wound cleanser prior to applying a clean dressing using gauze sponges, not tissue or cotton balls. Prim Dressing: Maxorb Extra Calcium Alginate 2x2 in 2 x Per Day/15 Days ary Discharge Instructions: Apply calcium alginate over sorbact. Prim Dressing: Cutimed Sorbact Swab 2 x Per Day/15 Days ary Discharge Instructions: Apply to wound bed pack into undermining. Cover with alginate. Secondary Dressing: Optifoam Non-Adhesive Dressing, 4x4 in (Generic) 2 x Per Day/15 Days Discharge Instructions: Apply a foam donut one layer over primary dressing as directed. Secondary Dressing: Woven Gauze Sponge, Non-Sterile 4x4 in (Generic) 2 x Per Day/15 Days Discharge Instructions: Apply over primary dressing as directed. Secured With: [employer], 4.5x3.1 (in/yd) (Generic) 2 x Per Day/15 Days Discharge Instructions: Secure with Kerlix as directed. Secured With: 28M Medipore H Soft Cloth Surgical T ape, 4 x 10 (in/yd) (Generic) 2 x Per Day/15 Days Discharge Instructions: Secure with tape as directed. Patient Medications llergies: Documentation for allergies has not been submitted A Notifications Medication Indication Start End 11/01/2021 lidocaine DOSE topical 5 % cream - cream topical applied only in clinic for debridements. Electronic Signature(s) Signed: 11/01/2021 3:05:51 PM By: Kalman Shan DO Entered By: Kalman Shan on 11/01/2021  14:52:14 -------------------------------------------------------------------------------- Problem List Details Patient Name: Date of Service: [name] [name], [name] 11/01/2021 1:45 PM Medical Record Number: [id number] Patient Account Number: [id number] Date of Birth/Sex: Treating RN: 11-14-1955 (66 y.o. [name], [name] Primary Care Provider: [name] Other Clinician: Referring Provider: Treating Provider/Extender: [name] in Treatment: 4 Active Problems ICD-10 Encounter Code Description Active Date MDM Diagnosis L97.512 Non-pressure 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 10/01/2021 No Yes E11.621 Type 2 diabetes mellitus  with foot ulcer 10/01/2021 No Yes E11.42 Type 2 diabetes mellitus with diabetic polyneuropathy 10/01/2021 No Yes Inactive Problems Resolved Problems Electronic Signature(s) Signed: 11/01/2021 3:05:51 PM By: Kalman Shan DO Entered By: Kalman Shan on 11/01/2021 14:49:44 -------------------------------------------------------------------------------- Progress Note Details Patient Name: Date of Service: [name] [name], [name] 11/01/2021 1:45 PM Medical Record Number: [id number] Patient Account Number: [id number] Date of Birth/Sex: Treating RN: 1955-02-22 (66 y.o. F) Primary Care Provider: [name] Other Clinician: Referring Provider: Treating Provider/Extender: [name] in Treatment: 4 Subjective Chief Complaint Information obtained from Patient 10/27; Right Plantar foot wound 11/21; 2 small areas of skin breakdown to the right foot following cast placement 8/29; right plantar foot wound History of Present Illness (HPI) Admission 12/17/2020 Ms. Jaylia Pettus is a 66 year old female with a past medical history of insulin-dependent type 2 diabetes, hypothyroidism and daily1 pack per day cigarette smoker the presents to the clinic for a  6-week history of nonhealing wound to the right first MTPJ. She has been following with [name], podiatry for this issue. She has been using silver alginate with dressing changes. She uses a postsurgical shoe and offloading pads. She currently denies signs of infection. 10/31; patient presents for follow-up. She tolerated the soft cast fine although she states that she felt her foot rolling to one side. She denies signs of infection. She would like to do the total contact cast today. 12/23/2020 upon evaluation today patient appears to be doing excellent in regard to her wound on the foot and she is in a total contact cast. I do think this is appropriate this is the first cast change which we are obliged to do to ensure nothing is rubbing everything appears to be doing quite well and very pleased in that regard. 11/7; patient presents for follow-up. She had no issues with the cast. She denies signs of infection. 11/14; patient presents for 1 week follow-up. She has had no issues with the cast. She denies signs of infection. 11/21; patient presents for 1 week follow-up. She did develop 2 small areas of skin breakdown on either side of the right foot from the cast rubbing. She states she did not feel the wounds developing. She currently denies signs of infection. 11/28; patient presents for 1 week follow-up. She has no issues or complaints today. She denies pain or acute signs of infection. 12/12; patient presents for follow-up. She reports improvement to her right lateral foot wound. She has been using silver alginate to the area. She has no issues or complaints today. 12/19; patient presents for follow-up. She reports that her right lateral foot wound has healed. She has no issues or complaints today. 1/3; patient presents for follow-up. She has no issues or complaints today. She reports no open wounds. Readmission 10/01/2021 [name] a 66 year old female's with a past medical history  of insulin-dependent controlled type 2 diabetes complicated by peripheral neuropathy that presents to the clinic for a 20-monthhistory of nonhealing ulcer to the right foot. I have seen her before for a wound to the same area that was treated and healed. Her wound today however this is much deeper and it has thick yellow drainage. She is not sure exactly how it started. She noticed it 1 day. She has been following with Dr. EAmalia Haileyfor this issue. She had a wound culture that showed a mix of organisms on 09/01/2021. She was recently started on doxycycline. She is using the dConservation officer, nature She  has been using Iodosorb with dressing changes. She currently denies systemic signs of infection. 8/21; patient presents for follow-up. She states she started and completed levofloxacin. She is still taking doxycycline. She reports improvement in drainage and odor. She has been doing Dakin's wet-to-dry dressings as well. She is using her front offloading shoe. She denies signs of infection. 8/29; patient presents for follow-up. She is still taking doxycycline. She continues to report improvement in drainage and reports no odor. She denies any purulent drainage. She has been doing silver alginate to the wound bed and using her front offloading shoe. 9/5; patient presents for follow-up. She completed the course of levofloxacin. She has someone that is able to drive her today so we can place the total contact cast. She reports no signs of infection. We have been doing silver alginate to the wound bed. 9/7; patient presents for follow-up. She has tolerated the total contact cast well. She presents for her obligatory cast change. She has no issues or complaints today. 9/11; patient presents for follow-up. She tolerated the total contact cast well. She has no issues or complaints today. We discussed potentially doing a skin substitute and patient would like to see if her insurance will cover this. Patient  History Information obtained from Patient, Chart. Family History Unknown History. Social History Current every day smoker - 1 pack/a/day, Alcohol Use - Never, Drug Use - No History, Caffeine Use - Moderate. Medical History Eyes Denies history of Cataracts, Glaucoma, Optic Neuritis Ear/Nose/Mouth/Throat Denies history of Chronic sinus problems/congestion, Middle ear problems Hematologic/Lymphatic Denies history of Anemia, Hemophilia, Human Immunodeficiency Virus, Lymphedema, Sickle Cell Disease Respiratory Patient has history of Sleep Apnea Denies history of Aspiration, Asthma, Chronic Obstructive Pulmonary Disease (COPD), Pneumothorax, Tuberculosis Cardiovascular Patient has history of Coronary Artery Disease, Hypertension Denies history of Angina, Arrhythmia, Congestive Heart Failure, Deep Vein Thrombosis, Hypotension, Myocardial Infarction, Peripheral Arterial Disease, Peripheral Venous Disease, Phlebitis, Vasculitis Gastrointestinal Denies history of Cirrhosis , Colitis, Crohnoos, Hepatitis A, Hepatitis B, Hepatitis C Endocrine Patient has history of Type II Diabetes Denies history of Type I Diabetes Genitourinary Denies history of End Stage Renal Disease Immunological Denies history of Lupus Erythematosus, Raynaudoos, Scleroderma Integumentary (Skin) Denies history of History of Burn Musculoskeletal Denies history of Gout, Rheumatoid Arthritis, Osteoarthritis, Osteomyelitis Neurologic Patient has history of Neuropathy Denies history of Dementia, Quadriplegia, Paraplegia, Seizure Disorder Hospitalization/Surgery History - 2nd and 3rd right toe amps. - total hysterectomy. - cholecystectomy. - 2 knee surgery's on left and 1 on right. Medical A Surgical History Notes nd Cardiovascular hypercholesterolemia Objective Constitutional respirations regular, non-labored and within target range for patient.. Vitals Time Taken: 1:55 PM, Temperature: 97.8 F, Pulse: 95 bpm,  Respiratory Rate: 20 breaths/min, Blood Pressure: 152/68 mmHg. Cardiovascular 2+ dorsalis pedis/posterior tibialis pulses. Psychiatric pleasant and cooperative. General Notes: Right foot: T the first submetatarsal there is an open wound with circumferential undermining. Granulation tissue and non viable tissue noted. o No probing to bone. mild maceration to the periwound. No signs of surrounding infection. Integumentary (Hair, Skin) Wound #4 status is Open. Original cause of wound was Gradually Appeared. The date acquired was: 07/22/2021. The wound has been in treatment 4 weeks. The wound is located on the Right,Plantar Metatarsal head first. The wound measures 1cm length x 1.1cm width x 0.5cm depth; 0.864cm^2 area and 0.432cm^3 volume. There is Fat Layer (Subcutaneous Tissue) exposed. There is no tunneling noted, however, there is undermining starting at 12:00 and ending at 12:00 with a maximum distance of 0.4cm. There is a  large amount of serosanguineous drainage noted. The wound margin is distinct with the outline attached to the wound base. There is large (67-100%) red, pink, friable granulation within the wound bed. There is no necrotic tissue within the wound bed. Assessment Active Problems ICD-10 Non-pressure 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 Type 2 diabetes mellitus with foot ulcer Type 2 diabetes mellitus with diabetic polyneuropathy Patient's wound is stable. I debrided nonviable tissue. Since she is having some maceration to the periwound I recommended switching the dressing to Sorbact and calcium alginate backing. Continue the TCC. We discussed potentially doing a skin substitute and patient would like to proceed with this. We will run an IVR for Grafix and organogenesis. Procedures Wound #4 Pre-procedure diagnosis of Wound #4 is a Diabetic Wound/Ulcer of the Lower Extremity located on the Right,Plantar Metatarsal head first .Severity of Tissue Pre  Debridement is: Fat layer exposed. There was a Excisional Skin/Subcutaneous Tissue Debridement with a total area of 4 sq cm performed by Kalman Shan, DO. With the following instrument(s): Curette to remove Viable and Non-Viable tissue/material. Material removed includes Subcutaneous Tissue, Slough, Skin: Dermis, and Skin: Epidermis after achieving pain control using Lidocaine 5% topical ointment. A time out was conducted at 14:05, prior to the start of the procedure. A Minimum amount of bleeding was controlled with Pressure. The procedure was tolerated well with a pain level of 0 throughout and a pain level of 0 following the procedure. Post Debridement Measurements: 1cm length x 1.1cm width x 0.5cm depth; 0.432cm^3 volume. Character of Wound/Ulcer Post Debridement is improved. Severity of Tissue Post Debridement is: Fat layer exposed. Post procedure Diagnosis Wound #4: Same as Pre-Procedure Pre-procedure diagnosis of Wound #4 is a Diabetic Wound/Ulcer of the Lower Extremity located on the Right,Plantar Metatarsal head first . There was a Total Contact Cast Procedure by Kalman Shan, DO. Post procedure Diagnosis Wound #4: Same as Pre-Procedure Plan Follow-up Appointments: Return Appointment in 1 week. - Monday ****TCC CHANGE**** Dr. Heber [place] and Allayne Butcher, Room 9 Anesthetic: (In clinic) Topical Lidocaine 5% applied to wound bed Cellular or Tissue Based Products: Wound #4 Right,Plantar Metatarsal head first: Cellular or Tissue Based Product Type: - run insurance auth for organogenesis products and Grafix. Bathing/ Shower/ Hygiene: May shower with protection but do not get wound dressing(s) wet. Edema Control - Lymphedema / SCD / Other: Elevate legs to the level of the heart or above for 30 minutes daily and/or when sitting, a frequency of: Avoid standing for long periods of time. Off-Loading: T Contact Cast to Right Lower Extremity otal The following medication(s) was prescribed:  lidocaine topical 5 % cream cream topical applied only in clinic for debridements. was prescribed at facility WOUND #4: - Metatarsal head first Wound Laterality: Plantar, Right Cleanser: Soap and Water 2 x Per Day/15 Days Discharge Instructions: May shower and wash wound with dial antibacterial soap and water prior to dressing change. Cleanser: Wound Cleanser (Generic) 2 x Per Day/15 Days Discharge Instructions: Cleanse the wound with wound cleanser prior to applying a clean dressing using gauze sponges, not tissue or cotton balls. Prim Dressing: Maxorb Extra Calcium Alginate 2x2 in 2 x Per Day/15 Days ary Discharge Instructions: Apply calcium alginate over sorbact. Prim Dressing: Cutimed Sorbact Swab 2 x Per Day/15 Days ary Discharge Instructions: Apply to wound bed pack into undermining. Cover with alginate. Secondary Dressing: Optifoam Non-Adhesive Dressing, 4x4 in (Generic) 2 x Per Day/15 Days Discharge Instructions: Apply a foam donut one layer over primary  dressing as directed. Secondary Dressing: Woven Gauze Sponge, Non-Sterile 4x4 in (Generic) 2 x Per Day/15 Days Discharge Instructions: Apply over primary dressing as directed. Secured With: [employer] [employer], 4.5x3.1 (in/yd) (Generic) 2 x Per Day/15 Days Discharge Instructions: Secure with Kerlix as directed. Secured With: 3M Medipore H Soft Cloth Surgical T ape, 4 x 10 (in/yd) (Generic) 2 x Per Day/15 Days Discharge Instructions: Secure with tape as directed. 1. In office sharp debridement 2. Sorbact and calcium alginate 3. T contact cast placed in standard fashion otal 4. Follow-up in 1 week Electronic Signature(s) Signed: 11/01/2021 3:05:51 PM By: Kalman Shan DO Entered By: Kalman Shan on 11/01/2021 14:53:29 -------------------------------------------------------------------------------- HxROS Details Patient Name: Date of Service: [name] [name], [name] 11/01/2021 1:45 PM Medical Record Number:  [id number] Patient Account Number: [id number] Date of Birth/Sex: Treating RN: Apr 01, 1955 (66 y.o. F) Primary Care Provider: [name] Other Clinician: Referring Provider: Treating Provider/Extender: [name] in Treatment: 4 Information Obtained From Patient Chart Eyes Medical History: Negative for: Cataracts; Glaucoma; Optic Neuritis Ear/Nose/Mouth/Throat Medical History: Negative for: Chronic sinus problems/congestion; Middle ear problems Hematologic/Lymphatic Medical History: Negative for: Anemia; Hemophilia; Human Immunodeficiency Virus; Lymphedema; Sickle Cell Disease Respiratory Medical History: Positive for: Sleep Apnea Negative for: Aspiration; Asthma; Chronic Obstructive Pulmonary Disease (COPD); Pneumothorax; Tuberculosis Cardiovascular Medical History: Positive for: Coronary Artery Disease; Hypertension Negative for: Angina; Arrhythmia; Congestive Heart Failure; Deep Vein Thrombosis; Hypotension; Myocardial Infarction; Peripheral Arterial Disease; Peripheral Venous Disease; Phlebitis; Vasculitis Past Medical History Notes: hypercholesterolemia Gastrointestinal Medical History: Negative for: Cirrhosis ; Colitis; Crohns; Hepatitis A; Hepatitis B; Hepatitis C Endocrine Medical History: Positive for: Type II Diabetes Negative for: Type I Diabetes Genitourinary Medical History: Negative for: End Stage Renal Disease Immunological Medical History: Negative for: Lupus Erythematosus; Raynauds; Scleroderma Integumentary (Skin) Medical History: Negative for: History of Burn Musculoskeletal Medical History: Negative for: Gout; Rheumatoid Arthritis; Osteoarthritis; Osteomyelitis Neurologic Medical History: Positive for: Neuropathy Negative for: Dementia; Quadriplegia; Paraplegia; Seizure Disorder Immunizations Pneumococcal Vaccine: Received Pneumococcal Vaccination: Yes Received Pneumococcal Vaccination On or After 60th  Birthday: Yes Tetanus Vaccine: Last tetanus shot: 12/17/2020 Implantable Devices No devices added Hospitalization / Surgery History Type of Hospitalization/Surgery 2nd and 3rd right toe amps total hysterectomy cholecystectomy 2 knee surgery's on left and 1 on right Family and Social History Unknown History: Yes; Current every day smoker - 1 pack/a/day; Alcohol Use: Never; Drug Use: No History; Caffeine Use: Moderate; Financial Concerns: No; Food, Clothing or Shelter Needs: No; Support System Lacking: No; Transportation Concerns: No Electronic Signature(s) Signed: 11/01/2021 3:05:51 PM By: Kalman Shan DO Entered By: Kalman Shan on 11/01/2021 14:50:56 -------------------------------------------------------------------------------- Total Contact Cast Details Patient Name: Date of Service: [name] [name], [name] 11/01/2021 1:45 PM Medical Record Number: [id number] Patient Account Number: [id number] Date of Birth/Sex: Treating RN: 1956/01/04 (66 y.o. [name], [name] Primary Care Provider: [name] Other Clinician: Referring Provider: Treating Provider/Extender: [name] in Treatment: 4 T Contact Cast Applied for Wound Assessment: otal Wound #4 Right,Plantar Metatarsal head first Performed By: Physician Kalman Shan, DO Post Procedure Diagnosis Same as Pre-procedure Electronic Signature(s) Signed: 11/01/2021 3:05:51 PM By: Kalman Shan DO Signed: 11/01/2021 5:26:50 PM By: Deon Pilling RN, BSN Entered By: Deon Pilling on 11/01/2021 14:12:10 -------------------------------------------------------------------------------- SuperBill Details Patient Name: Date of Service: [name] [name], [name] 11/01/2021 Medical Record Number: [id number] Patient Account Number: [id number] Date of Birth/Sex: Treating RN: [birth year] (66 y.o. [name] Primary Care Provider: [name]  Other Clinician: Referring  Provider: Treating Provider/Extender: Jaclyn Shaggy, Constance Goltz in Treatment: 4 Diagnosis Coding ICD-10 Codes Code Description [id number] Non-pressure 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 E11.621 Type 2 diabetes mellitus with foot ulcer E11.42 Type 2 diabetes mellitus with diabetic polyneuropathy Facility Procedures CPT4 Code: 48628241 Description: 75301 - DEB SUBQ TISSUE 20 SQ CM/< ICD-10 Diagnosis Description L97.512 Non-pressure 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 Modifier: Quantity: 1 Physician Procedures : CPT4 Code Description Modifier [id number] - WC PHYS SUBQ TISS 20 SQ CM ICD-10 Diagnosis Description L97.512 Non-pressure 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 Quantity: 1 Electronic Signature(s) Signed: 11/01/2021 3:05:51 PM By: Kalman Shan DO Entered By: Kalman Shan on 11/01/2021 14:53:36

## 2021-11-08 ENCOUNTER — Encounter (HOSPITAL_BASED_OUTPATIENT_CLINIC_OR_DEPARTMENT_OTHER): Payer: Medicare Other | Admitting: Internal Medicine

## 2021-11-08 DIAGNOSIS — E1142 Type 2 diabetes mellitus with diabetic polyneuropathy: Secondary | ICD-10-CM | POA: Diagnosis not present

## 2021-11-08 DIAGNOSIS — L97512 Non-pressure 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: Secondary | ICD-10-CM | POA: Diagnosis not present

## 2021-11-08 DIAGNOSIS — E11621 Type 2 diabetes mellitus with foot ulcer: Secondary | ICD-10-CM | POA: Diagnosis not present

## 2021-11-08 NOTE — Progress Notes (Signed)
CHRISTY, EHRSAM ([id number]) Visit Report for 11/08/2021 Arrival Information Details Patient Name: Date of Service: [name], [name] 11/08/2021 8:45 A M Medical Record Number: [id number] Patient Account Number: [id number] Date of Birth/Sex: Treating RN: [birth year] (66 y.o. [name], [name] Primary Care Selam Pietsch: [name] Other Clinician: Referring Reuel Lamadrid: Treating Sherrita Riederer/Extender: [name] in Treatment: 5 Visit Information History Since Last Visit Added or deleted any medications: No Patient Arrived: Ambulatory Any new allergies or adverse reactions: No Arrival Time: 08:40 Had a fall or experienced change in No Accompanied By: self activities of daily living that may affect Transfer Assistance: None risk of falls: Patient Identification Verified: Yes Signs or symptoms of abuse/neglect since last visito No Secondary Verification Process Completed: Yes Hospitalized since last visit: No Patient Requires Transmission-Based Precautions: No Implantable device outside of the clinic excluding No Patient Has Alerts: No cellular tissue based products placed in the center since last visit: Has Dressing in Place as Prescribed: Yes Has Footwear/Offloading in Place as Prescribed: Yes Right: T Contact Cast otal Pain Present Now: No Electronic Signature(s) Signed: 11/08/2021 10:43:39 AM By: Deon Pilling RN, BSN Entered By: Deon Pilling on 11/08/2021 08:40:28 -------------------------------------------------------------------------------- Encounter Discharge Information Details Patient Name: Date of Service: [name], [name] 11/08/2021 8:45 A M Medical Record Number: [id number] Patient Account Number: [id number] Date of Birth/Sex: Treating RN: January 02, 1956 (66 y.o. [name], [name] Primary Care Eldena Dede: [name] Other Clinician: Referring Johnell Landowski: Treating Kimiye Strathman/Extender: [name] in Treatment: 5 Encounter Discharge Information Items Post Procedure Vitals Discharge Condition: Stable Temperature (F): 98.7 Ambulatory Status: Ambulatory Pulse (bpm): 74 Discharge Destination: Home Respiratory Rate (breaths/min): 17 Transportation: Private Auto Blood Pressure (mmHg): 134/74 Accompanied By: self Schedule Follow-up Appointment: Yes Clinical Summary of Care: Patient Declined Electronic Signature(s) Signed: 11/08/2021 3:56:14 PM By: Rhae Hammock RN Entered By: Rhae Hammock on 11/08/2021 09:18:16 -------------------------------------------------------------------------------- Lower Extremity Assessment Details Patient Name: Date of Service: [name], [name] 11/08/2021 8:45 A M Medical Record Number: [id number] Patient Account Number: [id number] Date of Birth/Sex: Treating RN: 08-18-1955 (66 y.o. [name], [name] Primary Care Nicole Defino: [name] Other Clinician: Referring Nyela Cortinas: Treating Mariza Bourget/Extender: [name] in Treatment: 5 Edema Assessment Assessed: [name]: No] [Right: Yes] Edema: [Left: Ye] [Right: s] Calf Left: Right: Point of Measurement: From Medial Instep 43 cm Ankle Left: Right: Point of Measurement: From Medial Instep 24 cm Vascular Assessment Pulses: Dorsalis Pedis Palpable: [Right:Yes] Posterior Tibial Palpable: [Right:Yes] Electronic Signature(s) Signed: 11/08/2021 10:43:39 AM By: Deon Pilling RN, BSN Entered By: Deon Pilling on 11/08/2021 08:50:35 -------------------------------------------------------------------------------- Multi Wound Chart Details Patient Name: Date of Service: [name], [name] 11/08/2021 8:45 A M Medical Record Number: [id number] Patient Account Number: [id number] Date of Birth/Sex: Treating RN: 08/25/1955 (66 y.o. F) Primary Care Savita Runner: [name] Other Clinician: Referring Neymar Dowe: Treating Shawn Dannenberg/Extender:  [name] in Treatment: 5 Vital Signs Height(in): Pulse(bpm): Weight(lbs): Blood Pressure(mmHg): Body Mass Index(BMI): Temperature(F): Respiratory Rate(breaths/min): 17 Photos: [4:Right, Plantar Metatarsal head first] [N/A:N/A N/A] Wound Location: [4:Gradually Appeared] [N/A:N/A] Wounding Event: [4:Diabetic Wound/Ulcer of the Lower] [N/A:N/A] Primary Etiology: [4:Extremity Sleep Apnea, Coronary Artery] [N/A:N/A] Comorbid History: [4:Disease, Hypertension, Type II Diabetes, Neuropathy 07/22/2021] [N/A:N/A] Date Acquired: [4:5] [N/A:N/A] Weeks of Treatment: [4:Open] [N/A:N/A] Wound Status: [4:No] [N/A:N/A] Wound Recurrence: [4:Yes] [N/A:N/A] Pending A mputation on Presentation: [4:0.7x0.7x0.3] [N/A:N/A] Measurements L x W x D (cm) [4:0.385] [N/A:N/A] A (  cm) : rea [4:0.115] [N/A:N/A] Volume (cm) : [4:-442.30%] [N/A:N/A] % Reduction in A rea: [4:26.30%] [N/A:N/A] % Reduction in Volume: [4:Grade 2] [N/A:N/A] Classification: [4:Large] [N/A:N/A] Exudate A mount: [4:Serosanguineous] [N/A:N/A] Exudate Type: [4:red, brown] [N/A:N/A] Exudate Color: [4:Distinct, outline attached] [N/A:N/A] Wound Margin: [4:Large (67-100%)] [N/A:N/A] Granulation A mount: [4:Red, Pink, Friable] [N/A:N/A] Granulation Quality: [4:None Present (0%)] [N/A:N/A] Necrotic A mount: [4:Fat Layer (Subcutaneous Tissue): Yes N/A] Exposed Structures: [4:Fascia: No Tendon: No Muscle: No Joint: No Bone: No Large (67-100%)] [N/A:N/A] Epithelialization: [4:Debridement - Excisional] [N/A:N/A] Debridement: Pre-procedure Verification/Time Out 09:05 [N/A:N/A] Taken: [4:Lidocaine] [N/A:N/A] Pain Control: [4:Callus, Subcutaneous, Slough] [N/A:N/A] Tissue Debrided: [4:Skin/Subcutaneous Tissue] [N/A:N/A] Level: [4:0.49] [N/A:N/A] Debridement A (sq cm): [4:rea Curette] [N/A:N/A] Instrument: [4:Minimum] [N/A:N/A] Bleeding: [4:Silver Nitrate] [N/A:N/A] Hemostasis A chieved: [4:0]  [N/A:N/A] Procedural Pain: [4:0] [N/A:N/A] Post Procedural Pain: [4:Procedure was tolerated well] [N/A:N/A] Debridement Treatment Response: [4:0.7x0.7x0.3] [N/A:N/A] Post Debridement Measurements L x W x D (cm) [4:0.115] [N/A:N/A] Post Debridement Volume: (cm) [4:Thick callous] [N/A:N/A] Assessment Notes: [4:Cellular or Tissue Based Product] [N/A:N/A] Procedures Performed: [4:Debridement] Treatment Notes Electronic Signature(s) Signed: 11/08/2021 9:30:39 AM By: Kalman Shan DO Entered By: Kalman Shan on 11/08/2021 09:15:32 -------------------------------------------------------------------------------- Multi-Disciplinary Care Plan Details Patient Name: Date of Service: [name], [name] 11/08/2021 8:45 A M Medical Record Number: [id number] Patient Account Number: [id number] Date of Birth/Sex: Treating RN: [birth year] (66 y.o. [name], [name] Primary Care Satin Boal: [name] Other Clinician: Referring Imogene Gravelle: Treating Creedence Heiss/Extender: [name] in Treatment: 5 Active Inactive Wound/Skin Impairment Nursing Diagnoses: Impaired tissue integrity Knowledge deficit related to ulceration/compromised skin integrity Goals: Patient will have a decrease in wound volume by X% from date: (specify in notes) Date Initiated: 10/01/2021 Target Resolution Date: 11/20/2021 Goal Status: Active Patient/caregiver will verbalize understanding of skin care regimen Date Initiated: 10/01/2021 Target Resolution Date: 11/20/2021 Goal Status: Active Ulcer/skin breakdown will have a volume reduction of 30% by week 4 Date Initiated: 10/01/2021 Target Resolution Date: 11/20/2021 Goal Status: Active Interventions: Assess patient/caregiver ability to obtain necessary supplies Assess patient/caregiver ability to perform ulcer/skin care regimen upon admission and as needed Assess ulceration(s) every visit Notes: Electronic Signature(s) Signed:  11/08/2021 3:56:14 PM By: Rhae Hammock RN Entered By: Rhae Hammock on 11/08/2021 08:53:53 -------------------------------------------------------------------------------- Pain Assessment Details Patient Name: Date of Service: [name], [name] 11/08/2021 8:45 A M Medical Record Number: [id number] Patient Account Number: [id number] Date of Birth/Sex: Treating RN: 25-Jun-1955 (66 y.o. [name], [name] Primary Care Jordann Grime: [name] Other Clinician: Referring Sonni Barse: Treating Nilton Lave/Extender: [name] in Treatment: 5 Active Problems Location of Pain Severity and Description of Pain Patient Has Paino No Site Locations Pain Management and Medication Current Pain Management: Electronic Signature(s) Signed: 11/08/2021 10:43:39 AM By: Deon Pilling RN, BSN Entered By: Deon Pilling on 11/08/2021 08:41:09 -------------------------------------------------------------------------------- Patient/Caregiver Education Details Patient Name: Date of Service: [name], [name] 9/18/2023andnbsp8:45 A M Medical Record Number: [id number] Patient Account Number: [id number] Date of Birth/Gender: Treating RN: Oct 27, 1955 (66 y.o. [name], [name] Primary Care Physician: [name] Other Clinician: Referring Physician: Treating Physician/Extender: [name] in Treatment: 5 Education Assessment Education Provided To: Patient Education Topics Provided Wound/Skin Impairment: Methods: Explain/Verbal Responses: Reinforcements needed, State content correctly Electronic Signature(s) Signed: 11/08/2021 3:56:14 PM By: Rhae Hammock RN Entered By: Rhae Hammock on 11/08/2021 08:54:21 -------------------------------------------------------------------------------- Wound Assessment Details Patient Name: Date of Service: [name], [name] 11/08/2021 8:45 A M Medical Record Number:  [id number] Patient Account Number: [id number] Date of Birth/Sex: Treating RN: [birth year] (66 y.o. [name], [name] Primary Care Elmar Antigua: [name] Other Clinician: Referring Bethannie Iglehart: Treating Quindarius Cabello/Extender: [name] in Treatment: 5 Wound Status Wound Number: 4 Primary Diabetic Wound/Ulcer of the Lower Extremity Etiology: Wound Location: Right, Plantar Metatarsal head first Wound Open Wounding Event: Gradually Appeared Status: Date Acquired: 07/22/2021 Comorbid Sleep Apnea, Coronary Artery Disease, Hypertension, Type II Weeks Of Treatment: 5 History: Diabetes, Neuropathy Clustered Wound: No Pending Amputation On Presentation Photos Wound Measurements Length: (cm) 0.7 Width: (cm) 0.7 Depth: (cm) 0.3 Area: (cm) 0.385 Volume: (cm) 0.115 % Reduction in Area: -442.3% % Reduction in Volume: 26.3% Epithelialization: Large (67-100%) Tunneling: No Undermining: No Wound Description Classification: Grade 2 Wound Margin: Distinct, outline attached Exudate Amount: Large Exudate Type: Serosanguineous Exudate Color: red, brown Foul Odor After Cleansing: No Slough/Fibrino No Wound Bed Granulation Amount: Large (67-100%) Exposed Structure Granulation Quality: Red, Pink, Friable Fascia Exposed: No Necrotic Amount: None Present (0%) Fat Layer (Subcutaneous Tissue) Exposed: Yes Tendon Exposed: No Muscle Exposed: No Joint Exposed: No Bone Exposed: No Assessment Notes Thick callous Treatment Notes Wound #4 (Metatarsal head first) Wound Laterality: Plantar, Right Cleanser Soap and Water Discharge Instruction: May shower and wash wound with dial antibacterial soap and water prior to dressing change. Wound Cleanser Discharge Instruction: Cleanse the wound with wound cleanser prior to applying a clean dressing using gauze sponges, not tissue or cotton balls. Peri-Wound Care Topical Primary Dressing Maxorb Extra Calcium Alginate  2x2 in Discharge Instruction: Apply calcium alginate over sorbact. Grafix skin sub Secondary Dressing Optifoam Non-Adhesive Dressing, 4x4 in Discharge Instruction: Apply a foam donut one layer over primary dressing as directed. Woven Gauze Sponge, Non-Sterile 4x4 in Discharge Instruction: Apply over primary dressing as directed. Secured With [employer], 4.5x3.1 (in/yd) Discharge Instruction: Secure with Kerlix as directed. 45M Medipore H Soft Cloth Surgical T ape, 4 x 10 (in/yd) Discharge Instruction: Secure with tape as directed. Compression Wrap Compression Stockings Add-Ons Electronic Signature(s) Signed: 11/08/2021 3:56:14 PM By: Rhae Hammock RN Entered By: Rhae Hammock on 11/08/2021 08:52:01 -------------------------------------------------------------------------------- Vitals Details Patient Name: Date of Service: [name], [name] 11/08/2021 8:45 A M Medical Record Number: [id number] Patient Account Number: [id number] Date of Birth/Sex: Treating RN: [birth year] (66 y.o. [name] Primary Care Elizjah Noblet: [name] Other Clinician: Referring Ayo Guarino: Treating Gola Bribiesca/Extender: [name] in Treatment: 5 Vital Signs Time Taken: 08:40 Respiratory Rate (breaths/min): 17 Reference Range: 80 - 120 mg / dl Electronic Signature(s) Signed: 11/08/2021 10:43:39 AM By: Deon Pilling RN, BSN Entered By: Deon Pilling on 11/08/2021 08:41:04

## 2021-11-08 NOTE — Progress Notes (Signed)
[name], [name] ([id number]) Visit Report for 11/08/2021 Chief Complaint Document Details Patient Name: Date of Service: [name], [name] 11/08/2021 8:45 A M Medical Record Number: [id number] Patient Account Number: [id number] Date of Birth/Sex: Treating RN: Feb 04, 1956 (66 y.o. F) Primary Care Provider: [name] Other Clinician: Referring Provider: Treating Provider/Extender: [name] in Treatment: 5 Information Obtained from: Patient Chief Complaint 10/27; Right Plantar foot wound 11/21; 2 small areas of skin breakdown to the right foot following cast placement 8/29; right plantar foot wound Electronic Signature(s) Signed: 11/08/2021 9:30:39 AM By: Kalman Shan DO Entered By: Kalman Shan on 11/08/2021 09:15:40 -------------------------------------------------------------------------------- Cellular or Tissue Based Product Details Patient Name: Date of Service: [name], [name] 11/08/2021 8:45 A M Medical Record Number: [id number] Patient Account Number: [id number] Date of Birth/Sex: Treating RN: 01-Jun-1955 (66 y.o. [name], [name] Primary Care Provider: [name] Other Clinician: Referring Provider: Treating Provider/Extender: [name] in Treatment: 5 Cellular or Tissue Based Product Type Wound #4 Right,Plantar Metatarsal head first Applied to: Performed By: Physician Kalman Shan, DO Cellular or Tissue Based Product Type: Grafix prime Level of Consciousness (Pre-procedure): Awake and Alert Pre-procedure Verification/Time Out Yes - 09:00 Taken: Location: genitalia / hands / feet / multiple digits Wound Size (sq cm): 0.49 Product Size (sq cm): 2 Waste Size (sq cm): 0 Amount of Product Applied (sq cm): 2 Instrument Used: Forceps, Scissors Lot #: [id number] Expiration Date: 02/06/2023 Fenestrated: No Reconstituted: Yes Solution Type: NS Solution Amount:  3 ML Lot #: [id number] Solution Expiration Date: 02/06/2023 Secured: Yes Secured With: Steri-Strips Dressing Applied: Yes Primary Dressing: ADAPTIC Procedural Pain: 0 Post Procedural Pain: 0 Response to Treatment: Procedure was tolerated well Level of Consciousness (Post- Awake and Alert procedure): Post Procedure Diagnosis Same as Pre-procedure Electronic Signature(s) Signed: 11/08/2021 9:30:39 AM By: Kalman Shan DO Signed: 11/08/2021 3:56:14 PM By: Rhae Hammock RN Entered By: Rhae Hammock on 11/08/2021 08:58:44 -------------------------------------------------------------------------------- Debridement Details Patient Name: Date of Service: [name], [name] 11/08/2021 8:45 A M Medical Record Number: [id number] Patient Account Number: [id number] Date of Birth/Sex: Treating RN: October 23, 1955 (66 y.o. [name], [name] Primary Care Provider: [name] Other Clinician: Referring Provider: Treating Provider/Extender: [name] in Treatment: 5 Debridement Performed for Assessment: Wound #4 Right,Plantar Metatarsal head first Performed By: Physician Kalman Shan, DO Debridement Type: Debridement Severity of Tissue Pre Debridement: Fat layer exposed Level of Consciousness (Pre-procedure): Awake and Alert Pre-procedure Verification/Time Out Yes - 09:05 Taken: Start Time: 09:05 Pain Control: Lidocaine T Area Debrided (L x W): otal 0.7 (cm) x 0.7 (cm) = 0.49 (cm) Tissue and other material debrided: Viable, Non-Viable, Callus, Slough, Subcutaneous, Slough Level: Skin/Subcutaneous Tissue Debridement Description: Excisional Instrument: Curette Bleeding: Minimum Hemostasis Achieved: Silver Nitrate End Time: 09:05 Procedural Pain: 0 Post Procedural Pain: 0 Response to Treatment: Procedure was tolerated well Level of Consciousness (Post- Awake and Alert procedure): Post Debridement Measurements of Total Wound Length:  (cm) 0.7 Width: (cm) 0.7 Depth: (cm) 0.3 Volume: (cm) 0.115 Character of Wound/Ulcer Post Debridement: Improved Severity of Tissue Post Debridement: Fat layer exposed Post Procedure Diagnosis Same as Pre-procedure Electronic Signature(s) Signed: 11/08/2021 9:30:39 AM By: Kalman Shan DO Signed: 11/08/2021 10:43:39 AM By: Deon Pilling RN, BSN Entered By: Deon Pilling on 11/08/2021 09:06:32 -------------------------------------------------------------------------------- HPI Details Patient Name: Date of Service: [name], [name] 11/08/2021 8:45 A M Medical Record Number: [id number] Patient Account Number:  [id number] Date of Birth/Sex: Treating RN: 1955/07/08 (66 y.o. F) Primary Care Provider: [name] Other Clinician: Referring Provider: Treating Provider/Extender: [name] in Treatment: 5 History of Present Illness HPI Description: Admission 12/17/2020 Ms. Analis Distler is a 66 year old female with a past medical history of insulin-dependent type 2 diabetes, hypothyroidism and daily1 pack per day cigarette smoker the presents to the clinic for a 6-week history of nonhealing wound to the right first MTPJ. She has been following with [name], podiatry for this issue. She has been using silver alginate with dressing changes. She uses a postsurgical shoe and offloading pads. She currently denies signs of infection. 10/31; patient presents for follow-up. She tolerated the soft cast fine although she states that she felt her foot rolling to one side. She denies signs of infection. She would like to do the total contact cast today. 12/23/2020 upon evaluation today patient appears to be doing excellent in regard to her wound on the foot and she is in a total contact cast. I do think this is appropriate this is the first cast change which we are obliged to do to ensure nothing is rubbing everything appears to be doing quite well and very  pleased in that regard. 11/7; patient presents for follow-up. She had no issues with the cast. She denies signs of infection. 11/14; patient presents for 1 week follow-up. She has had no issues with the cast. She denies signs of infection. 11/21; patient presents for 1 week follow-up. She did develop 2 small areas of skin breakdown on either side of the right foot from the cast rubbing. She states she did not feel the wounds developing. She currently denies signs of infection. 11/28; patient presents for 1 week follow-up. She has no issues or complaints today. She denies pain or acute signs of infection. 12/12; patient presents for follow-up. She reports improvement to her right lateral foot wound. She has been using silver alginate to the area. She has no issues or complaints today. 12/19; patient presents for follow-up. She reports that her right lateral foot wound has healed. She has no issues or complaints today. 1/3; patient presents for follow-up. She has no issues or complaints today. She reports no open wounds. Readmission 10/01/2021 Ms. Manie Bealer is a 66 year old female's with a past medical history of insulin-dependent controlled type 2 diabetes complicated by peripheral neuropathy that presents to the clinic for a 84-monthhistory of nonhealing ulcer to the right foot. I have seen her before for a wound to the same area that was treated and healed. Her wound today however this is much deeper and it has thick yellow drainage. She is not sure exactly how it started. She noticed it 1 day. She has been following with Dr. EAmalia Haileyfor this issue. She had a wound culture that showed a mix of organisms on 09/01/2021. She was recently started on doxycycline. She is using the dConservation officer, nature She has been using Iodosorb with dressing changes. She currently denies systemic signs of infection. 8/21; patient presents for follow-up. She states she started and completed levofloxacin. She is still taking  doxycycline. She reports improvement in drainage and odor. She has been doing Dakin's wet-to-dry dressings as well. She is using her front offloading shoe. She denies signs of infection. 8/29; patient presents for follow-up. She is still taking doxycycline. She continues to report improvement in drainage and reports no odor. She denies any purulent drainage. She has been doing silver alginate to the  wound bed and using her front offloading shoe. 9/5; patient presents for follow-up. She completed the course of levofloxacin. She has someone that is able to drive her today so we can place the total contact cast. She reports no signs of infection. We have been doing silver alginate to the wound bed. 9/7; patient presents for follow-up. She has tolerated the total contact cast well. She presents for her obligatory cast change. She has no issues or complaints today. 9/11; patient presents for follow-up. She tolerated the total contact cast well. She has no issues or complaints today. We discussed potentially doing a skin substitute and patient would like to see if her insurance will cover this. 9/18; patient presents for follow-up. She had no issues with the total contact cast. She been approved for Grafix and patient would like to have this placed today. Electronic Signature(s) Signed: 11/08/2021 9:30:39 AM By: Kalman Shan DO Entered By: Kalman Shan on 11/08/2021 09:16:10 -------------------------------------------------------------------------------- Physical Exam Details Patient Name: Date of Service: [name], [name] 11/08/2021 8:45 A M Medical Record Number: [id number] Patient Account Number: [id number] Date of Birth/Sex: Treating RN: Mar 18, 1955 (66 y.o. F) Primary Care Provider: [name] Other Clinician: Referring Provider: Treating Provider/Extender: [name] in Treatment: 5 Constitutional respirations regular, non-labored and  within target range for patient.. Cardiovascular 2+ dorsalis pedis/posterior tibialis pulses. Psychiatric pleasant and cooperative. Notes Right foot: T the first submetatarsal there is an open wound with minimal circumferential undermining. Granulation tissue with slough and hyper granulated o areas. No signs of surrounding infection. Electronic Signature(s) Signed: 11/08/2021 9:30:39 AM By: Kalman Shan DO Entered By: Kalman Shan on 11/08/2021 09:17:08 -------------------------------------------------------------------------------- Physician Orders Details Patient Name: Date of Service: [name], [name] 11/08/2021 8:45 A M Medical Record Number: [id number] Patient Account Number: [id number] Date of Birth/Sex: Treating RN: December 31, 1955 (66 y.o. [name], [name] Primary Care Provider: [name] Other Clinician: Referring Provider: Treating Provider/Extender: [name] in Treatment: 5 Verbal / Phone Orders: No Diagnosis Coding Follow-up Appointments ppointment in 1 week. - ***EXTRA TIME for skin sub application and a TCC*** Return A Monday w/ Dr. Heber [place] and Allayne Butcher, Room 9 Anesthetic (In clinic) Topical Lidocaine 5% applied to wound bed Cellular or Tissue Based Products Wound #4 Right,Plantar Metatarsal head first Cellular or Tissue Based Product Type: - Grafix 2 x 3 Bathing/ Shower/ Hygiene May shower with protection but do not get wound dressing(s) wet. Edema Control - Lymphedema / SCD / Other Elevate legs to the level of the heart or above for 30 minutes daily and/or when sitting, a frequency of: Avoid standing for long periods of time. Off-Loading Total Contact Cast to Right Lower Extremity Wound Treatment Wound #4 - Metatarsal head first Wound Laterality: Plantar, Right Cleanser: Soap and Water 2 x Per Day/15 Days Discharge Instructions: May shower and wash wound with dial antibacterial soap and water prior to  dressing change. Cleanser: Wound Cleanser (Generic) 2 x Per Day/15 Days Discharge Instructions: Cleanse the wound with wound cleanser prior to applying a clean dressing using gauze sponges, not tissue or cotton balls. Prim Dressing: Maxorb Extra Calcium Alginate 2x2 in 2 x Per Day/15 Days ary Discharge Instructions: Apply calcium alginate over sorbact. Prim Dressing: Grafix skin sub ary 2 x Per Day/15 Days Secondary Dressing: Optifoam Non-Adhesive Dressing, 4x4 in (Generic) 2 x Per Day/15 Days Discharge Instructions: Apply a foam donut one layer over primary dressing as directed. Secondary Dressing: Woven  Gauze Sponge, Non-Sterile 4x4 in (Generic) 2 x Per Day/15 Days Discharge Instructions: Apply over primary dressing as directed. Secured With: [employer], 4.5x3.1 (in/yd) (Generic) 2 x Per Day/15 Days Discharge Instructions: Secure with Kerlix as directed. Secured With: 54M Medipore H Soft Cloth Surgical T ape, 4 x 10 (in/yd) (Generic) 2 x Per Day/15 Days Discharge Instructions: Secure with tape as directed. Electronic Signature(s) Signed: 11/08/2021 9:30:39 AM By: Kalman Shan DO Entered By: Kalman Shan on 11/08/2021 09:17:16 -------------------------------------------------------------------------------- Problem List Details Patient Name: Date of Service: [name], [name] 11/08/2021 8:45 A M Medical Record Number: [id number] Patient Account Number: [id number] Date of Birth/Sex: Treating RN: [birth year] (66 y.o. F) Primary Care Provider: [name] Other Clinician: Referring Provider: Treating Provider/Extender: [name] in Treatment: 5 Active Problems ICD-10 Encounter Code Description Active Date MDM Diagnosis L97.512 Non-pressure 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 10/01/2021 No Yes E11.621 Type 2 diabetes mellitus with foot ulcer 10/01/2021 No Yes E11.42 Type 2 diabetes mellitus with  diabetic polyneuropathy 10/01/2021 No Yes Inactive Problems Resolved Problems Electronic Signature(s) Signed: 11/08/2021 9:30:39 AM By: Kalman Shan DO Entered By: Kalman Shan on 11/08/2021 09:15:26 -------------------------------------------------------------------------------- Progress Note Details Patient Name: Date of Service: [name], [name] 11/08/2021 8:45 A M Medical Record Number: [id number] Patient Account Number: [id number] Date of Birth/Sex: Treating RN: 1955-11-27 (66 y.o. F) Primary Care Provider: [name] Other Clinician: Referring Provider: Treating Provider/Extender: [name] in Treatment: 5 Subjective Chief Complaint Information obtained from Patient 10/27; Right Plantar foot wound 11/21; 2 small areas of skin breakdown to the right foot following cast placement 8/29; right plantar foot wound History of Present Illness (HPI) Admission 12/17/2020 Ms. Rosaria Kubin is a 66 year old female with a past medical history of insulin-dependent type 2 diabetes, hypothyroidism and daily1 pack per day cigarette smoker the presents to the clinic for a 6-week history of nonhealing wound to the right first MTPJ. She has been following with [name], podiatry for this issue. She has been using silver alginate with dressing changes. She uses a postsurgical shoe and offloading pads. She currently denies signs of infection. 10/31; patient presents for follow-up. She tolerated the soft cast fine although she states that she felt her foot rolling to one side. She denies signs of infection. She would like to do the total contact cast today. 12/23/2020 upon evaluation today patient appears to be doing excellent in regard to her wound on the foot and she is in a total contact cast. I do think this is appropriate this is the first cast change which we are obliged to do to ensure nothing is rubbing everything appears to be doing quite  well and very pleased in that regard. 11/7; patient presents for follow-up. She had no issues with the cast. She denies signs of infection. 11/14; patient presents for 1 week follow-up. She has had no issues with the cast. She denies signs of infection. 11/21; patient presents for 1 week follow-up. She did develop 2 small areas of skin breakdown on either side of the right foot from the cast rubbing. She states she did not feel the wounds developing. She currently denies signs of infection. 11/28; patient presents for 1 week follow-up. She has no issues or complaints today. She denies pain or acute signs of infection. 12/12; patient presents for follow-up. She reports improvement to her right lateral foot wound. She has  been using silver alginate to the area. She has no issues or complaints today. 12/19; patient presents for follow-up. She reports that her right lateral foot wound has healed. She has no issues or complaints today. 1/3; patient presents for follow-up. She has no issues or complaints today. She reports no open wounds. Readmission 10/01/2021 Ms. Aahna Rossa is a 66 year old female's with a past medical history of insulin-dependent controlled type 2 diabetes complicated by peripheral neuropathy that presents to the clinic for a 37-monthhistory of nonhealing ulcer to the right foot. I have seen her before for a wound to the same area that was treated and healed. Her wound today however this is much deeper and it has thick yellow drainage. She is not sure exactly how it started. She noticed it 1 day. She has been following with Dr. EAmalia Haileyfor this issue. She had a wound culture that showed a mix of organisms on 09/01/2021. She was recently started on doxycycline. She is using the dConservation officer, nature She has been using Iodosorb with dressing changes. She currently denies systemic signs of infection. 8/21; patient presents for follow-up. She states she started and completed levofloxacin. She is  still taking doxycycline. She reports improvement in drainage and odor. She has been doing Dakin's wet-to-dry dressings as well. She is using her front offloading shoe. She denies signs of infection. 8/29; patient presents for follow-up. She is still taking doxycycline. She continues to report improvement in drainage and reports no odor. She denies any purulent drainage. She has been doing silver alginate to the wound bed and using her front offloading shoe. 9/5; patient presents for follow-up. She completed the course of levofloxacin. She has someone that is able to drive her today so we can place the total contact cast. She reports no signs of infection. We have been doing silver alginate to the wound bed. 9/7; patient presents for follow-up. She has tolerated the total contact cast well. She presents for her obligatory cast change. She has no issues or complaints today. 9/11; patient presents for follow-up. She tolerated the total contact cast well. She has no issues or complaints today. We discussed potentially doing a skin substitute and patient would like to see if her insurance will cover this. 9/18; patient presents for follow-up. She had no issues with the total contact cast. She been approved for Grafix and patient would like to have this placed today. Patient History Information obtained from Patient, Chart. Family History Unknown History. Social History Current every day smoker - 1 pack/a/day, Alcohol Use - Never, Drug Use - No History, Caffeine Use - Moderate. Medical History Eyes Denies history of Cataracts, Glaucoma, Optic Neuritis Ear/Nose/Mouth/Throat Denies history of Chronic sinus problems/congestion, Middle ear problems Hematologic/Lymphatic Denies history of Anemia, Hemophilia, Human Immunodeficiency Virus, Lymphedema, Sickle Cell Disease Respiratory Patient has history of Sleep Apnea Denies history of Aspiration, Asthma, Chronic Obstructive Pulmonary Disease (COPD),  Pneumothorax, Tuberculosis Cardiovascular Patient has history of Coronary Artery Disease, Hypertension Denies history of Angina, Arrhythmia, Congestive Heart Failure, Deep Vein Thrombosis, Hypotension, Myocardial Infarction, Peripheral Arterial Disease, Peripheral Venous Disease, Phlebitis, Vasculitis Gastrointestinal Denies history of Cirrhosis , Colitis, Crohnoos, Hepatitis A, Hepatitis B, Hepatitis C Endocrine Patient has history of Type II Diabetes Denies history of Type I Diabetes Genitourinary Denies history of End Stage Renal Disease Immunological Denies history of Lupus Erythematosus, Raynaudoos, Scleroderma Integumentary (Skin) Denies history of History of Burn Musculoskeletal Denies history of Gout, Rheumatoid Arthritis, Osteoarthritis, Osteomyelitis Neurologic Patient has history of Neuropathy Denies history of  Dementia, Quadriplegia, Paraplegia, Seizure Disorder Hospitalization/Surgery History - 2nd and 3rd right toe amps. - total hysterectomy. - cholecystectomy. - 2 knee surgery's on left and 1 on right. Medical A Surgical History Notes nd Cardiovascular hypercholesterolemia Objective Constitutional respirations regular, non-labored and within target range for patient.. Vitals Time Taken: 8:40 AM, Respiratory Rate: 17 breaths/min. Cardiovascular 2+ dorsalis pedis/posterior tibialis pulses. Psychiatric pleasant and cooperative. General Notes: Right foot: T the first submetatarsal there is an open wound with minimal circumferential undermining. Granulation tissue with slough and hyper o granulated areas. No signs of surrounding infection. Integumentary (Hair, Skin) Wound #4 status is Open. Original cause of wound was Gradually Appeared. The date acquired was: 07/22/2021. The wound has been in treatment 5 weeks. The wound is located on the Right,Plantar Metatarsal head first. The wound measures 0.7cm length x 0.7cm width x 0.3cm depth; 0.385cm^2 area and  0.115cm^3 volume. There is Fat Layer (Subcutaneous Tissue) exposed. There is no tunneling or undermining noted. There is a large amount of serosanguineous drainage noted. The wound margin is distinct with the outline attached to the wound base. There is large (67-100%) red, pink, friable granulation within the wound bed. There is no necrotic tissue within the wound bed. General Notes: Thick callous Assessment Active Problems ICD-10 Non-pressure 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 Type 2 diabetes mellitus with foot ulcer Type 2 diabetes mellitus with diabetic polyneuropathy Patient's wound has shown improvement in size in appearance since last clinic visit. No signs of surrounding infection. I debrided nonviable tissue. I used silver nitrate to the hyper granulated areas. Grafix #1 was placed in standard fashion today. The total contact cast was continued. Procedures Wound #4 Pre-procedure diagnosis of Wound #4 is a Diabetic Wound/Ulcer of the Lower Extremity located on the Right,Plantar Metatarsal head first .Severity of Tissue Pre Debridement is: Fat layer exposed. There was a Excisional Skin/Subcutaneous Tissue Debridement with a total area of 0.49 sq cm performed by Kalman Shan, DO. With the following instrument(s): Curette to remove Viable and Non-Viable tissue/material. Material removed includes Callus, Subcutaneous Tissue, and Slough after achieving pain control using Lidocaine. No specimens were taken. A time out was conducted at 09:05, prior to the start of the procedure. A Minimum amount of bleeding was controlled with Silver Nitrate. The procedure was tolerated well with a pain level of 0 throughout and a pain level of 0 following the procedure. Post Debridement Measurements: 0.7cm length x 0.7cm width x 0.3cm depth; 0.115cm^3 volume. Character of Wound/Ulcer Post Debridement is improved. Severity of Tissue Post Debridement is: Fat layer exposed. Post  procedure Diagnosis Wound #4: Same as Pre-Procedure Pre-procedure diagnosis of Wound #4 is a Diabetic Wound/Ulcer of the Lower Extremity located on the Right,Plantar Metatarsal head first. A skin graft procedure using a bioengineered skin substitute/cellular or tissue based product was performed by Kalman Shan, DO with the following instrument(s): Forceps and Scissors. Grafix prime was applied and secured with Steri-Strips. 2 sq cm of product was utilized and 0 sq cm was wasted. Post Application, ADAPTIC was applied. A Time Out was conducted at 09:00, prior to the start of the procedure. The procedure was tolerated well with a pain level of 0 throughout and a pain level of 0 following the procedure. Post procedure Diagnosis Wound #4: Same as Pre-Procedure . Plan Follow-up Appointments: Return Appointment in 1 week. - ***EXTRA TIME for skin sub application and a TCC*** Monday w/ Dr. Heber [place] and Allayne Butcher, Room 9 Anesthetic: (In clinic) Topical  Lidocaine 5% applied to wound bed Cellular or Tissue Based Products: Wound #4 Right,Plantar Metatarsal head first: Cellular or Tissue Based Product Type: - Grafix 2 x 3 Bathing/ Shower/ Hygiene: May shower with protection but do not get wound dressing(s) wet. Edema Control - Lymphedema / SCD / Other: Elevate legs to the level of the heart or above for 30 minutes daily and/or when sitting, a frequency of: Avoid standing for long periods of time. Off-Loading: T Contact Cast to Right Lower Extremity otal WOUND #4: - Metatarsal head first Wound Laterality: Plantar, Right Cleanser: Soap and Water 2 x Per Day/15 Days Discharge Instructions: May shower and wash wound with dial antibacterial soap and water prior to dressing change. Cleanser: Wound Cleanser (Generic) 2 x Per Day/15 Days Discharge Instructions: Cleanse the wound with wound cleanser prior to applying a clean dressing using gauze sponges, not tissue or cotton balls. Prim Dressing: Maxorb  Extra Calcium Alginate 2x2 in 2 x Per Day/15 Days ary Discharge Instructions: Apply calcium alginate over sorbact. Prim Dressing: Grafix skin sub 2 x Per Day/15 Days ary Secondary Dressing: Optifoam Non-Adhesive Dressing, 4x4 in (Generic) 2 x Per Day/15 Days Discharge Instructions: Apply a foam donut one layer over primary dressing as directed. Secondary Dressing: Woven Gauze Sponge, Non-Sterile 4x4 in (Generic) 2 x Per Day/15 Days Discharge Instructions: Apply over primary dressing as directed. Secured With: [employer], 4.5x3.1 (in/yd) (Generic) 2 x Per Day/15 Days Discharge Instructions: Secure with Kerlix as directed. Secured With: 54M Medipore H Soft Cloth Surgical T ape, 4 x 10 (in/yd) (Generic) 2 x Per Day/15 Days Discharge Instructions: Secure with tape as directed. 1. In office sharp debridement 2. Silver nitrate 3. Grafix #1 was placed in standard fashion 4. T contact cast placed in standard fashion otal 5. Follow-up in 1 week Electronic Signature(s) Signed: 11/08/2021 9:30:39 AM By: Kalman Shan DO Entered By: Kalman Shan on 11/08/2021 09:18:32 -------------------------------------------------------------------------------- HxROS Details Patient Name: Date of Service: [name], [name] 11/08/2021 8:45 A M Medical Record Number: [id number] Patient Account Number: [id number] Date of Birth/Sex: Treating RN: [birth year] (66 y.o. F) Primary Care Provider: [name] Other Clinician: Referring Provider: Treating Provider/Extender: [name] in Treatment: 5 Information Obtained From Patient Chart Eyes Medical History: Negative for: Cataracts; Glaucoma; Optic Neuritis Ear/Nose/Mouth/Throat Medical History: Negative for: Chronic sinus problems/congestion; Middle ear problems Hematologic/Lymphatic Medical History: Negative for: Anemia; Hemophilia; Human Immunodeficiency Virus; Lymphedema; Sickle Cell  Disease Respiratory Medical History: Positive for: Sleep Apnea Negative for: Aspiration; Asthma; Chronic Obstructive Pulmonary Disease (COPD); Pneumothorax; Tuberculosis Cardiovascular Medical History: Positive for: Coronary Artery Disease; Hypertension Negative for: Angina; Arrhythmia; Congestive Heart Failure; Deep Vein Thrombosis; Hypotension; Myocardial Infarction; Peripheral Arterial Disease; Peripheral Venous Disease; Phlebitis; Vasculitis Past Medical History Notes: hypercholesterolemia Gastrointestinal Medical History: Negative for: Cirrhosis ; Colitis; Crohns; Hepatitis A; Hepatitis B; Hepatitis C Endocrine Medical History: Positive for: Type II Diabetes Negative for: Type I Diabetes Genitourinary Medical History: Negative for: End Stage Renal Disease Immunological Medical History: Negative for: Lupus Erythematosus; Raynauds; Scleroderma Integumentary (Skin) Medical History: Negative for: History of Burn Musculoskeletal Medical History: Negative for: Gout; Rheumatoid Arthritis; Osteoarthritis; Osteomyelitis Neurologic Medical History: Positive for: Neuropathy Negative for: Dementia; Quadriplegia; Paraplegia; Seizure Disorder Immunizations Pneumococcal Vaccine: Received Pneumococcal Vaccination: Yes Received Pneumococcal Vaccination On or After 60th Birthday: Yes Tetanus Vaccine: Last tetanus shot: 12/17/2020 Implantable Devices No devices added Hospitalization / Surgery History Type of Hospitalization/Surgery 2nd and 3rd right toe amps total hysterectomy cholecystectomy 2 knee  surgery's on left and 1 on right Family and Social History Unknown History: Yes; Current every day smoker - 1 pack/a/day; Alcohol Use: Never; Drug Use: No History; Caffeine Use: Moderate; Financial Concerns: No; Food, Clothing or Shelter Needs: No; Support System Lacking: No; Transportation Concerns: No Electronic Signature(s) Signed: 11/08/2021 9:30:39 AM By: Kalman Shan  DO Entered By: Kalman Shan on 11/08/2021 09:16:15 -------------------------------------------------------------------------------- Total Contact Cast Details Patient Name: Date of Service: [name], [name] 11/08/2021 8:45 A M Medical Record Number: [id number] Patient Account Number: [id number] Date of Birth/Sex: Treating RN: 1955/02/28 (66 y.o. [name], [name] Primary Care Provider: [name] Other Clinician: Referring Provider: Treating Provider/Extender: [name] in Treatment: 5 T Contact Cast Applied for Wound Assessment: otal Wound #4 Right,Plantar Metatarsal head first Performed By: Physician Kalman Shan, DO Post Procedure Diagnosis Same as Pre-procedure Electronic Signature(s) Signed: 11/08/2021 11:04:42 AM By: Kalman Shan DO Signed: 11/08/2021 3:56:14 PM By: Rhae Hammock RN Entered By: Rhae Hammock on 11/08/2021 10:12:46 -------------------------------------------------------------------------------- SuperBill Details Patient Name: Date of Service: [name], [name] 11/08/2021 Medical Record Number: [id number] Patient Account Number: [id number] Date of Birth/Sex: Treating RN: [birth year] (65 y.o. [name], [name] Primary Care Provider: [name] Other Clinician: Referring Provider: Treating Provider/Extender: [name] in Treatment: 5 Diagnosis Coding ICD-10 Codes Code Description [id number] Non-pressure 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 E11.621 Type 2 diabetes mellitus with foot ulcer E11.42 Type 2 diabetes mellitus with diabetic polyneuropathy Facility Procedures CPT4 Code: 22297989 Description: (Facility Use Only) Grafix Prime 2x3 Modifier: Quantity: 6 CPT4 Code: [id number] Description: 08144 - SKIN SUB GRAFT FACE/NK/HF/G ICD-10 Diagnosis Description L97.512 Non-pressure chronic ulcer of other part of right  foot with fat layer exposed E11.621 Type 2 diabetes mellitus with foot ulcer Modifier: Quantity: 1 Physician Procedures : CPT4 Code Description Modifier [id number] - WC PHYS SKIN SUB GRAFT FACE/NK/HF/G ICD-10 Diagnosis Description L97.512 Non-pressure 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 E11.621 Type 2 diabetes mellitus with foot ulcer Quantity: 1 Electronic Signature(s) Signed: 11/09/2021 12:05:18 PM By: Kalman Shan DO Signed: 11/10/2021 3:46:40 PM By: Rhae Hammock RN Previous Signature: 11/08/2021 9:30:39 AM Version By: Kalman Shan DO Entered By: Rhae Hammock on 11/09/2021 11:05:49

## 2021-11-10 ENCOUNTER — Ambulatory Visit (INDEPENDENT_AMBULATORY_CARE_PROVIDER_SITE_OTHER): Payer: Medicare Other | Admitting: Licensed Clinical Social Worker

## 2021-11-10 DIAGNOSIS — F331 Major depressive disorder, recurrent, moderate: Secondary | ICD-10-CM

## 2021-11-10 NOTE — Progress Notes (Signed)
   THERAPIST PROGRESS NOTE  Session Time: 1015-11a  [place] in office visit for patient and LCSW clinician   Participation Level: Active  Behavioral Response: NAAlertDepressed  Type of Therapy: Individual Therapy  Treatment Goals addressed: Problem: Depression Goal:  Decrease depressive symptoms and improve levels of effective functioning-pt reports a decrease in overall depression symptoms 3 out of 5 sessions documented.  Outcome: Progressing Goal: Develop healthy thinking patterns and beliefs about self, others, and the world that lead to the alleviation and help prevent the relapse of depression per self report 3 out of 5 sessions documented.   Outcome: Progressing Intervention: Encourage verbalization of feelings/concerns/expectations Note: Allowed/encouraged Intervention: Encourage self-care activities Note: reviewed    ProgressTowards Goals: Progressing  Interventions: CBT, Supportive, and Reframing  Summary: [name] is a 66 y.o. female who presents with improving symptoms associated with depression diagnosis.   Allowed pt to explore and express thoughts and feelings associated with recent life situations and external stressors.Patient reports that she is experiencing significant stress after a recent verbal altercation with her husband. Allow patient safe space to explore her thoughts and feelings associated with the relationship stress. Discuss communication with her husband, and ways that they have identified issues and taken steps towards conflict resolution in a healthy way. Patient reports that she's feeling better about it after they had the conversation.  Patient reports that she is having positive relationships with her sister, brother-in-law. Patient still feels a very strong sense of responsibility towards her sister, and tries to spend quality time with her as frequently as possible.  Explore patients continuing health  promoting behaviors. Patient reports that she is being intentional about exercise, watching the food quality that she's eating, and is engaging in daily diabetes management behaviors. Praised patience continuing medication to herself, and encouraged her to continue.   Reviewed coping skills that help patient the most when she is feeling most depressed. Patient reflects understanding and cooperation.  Continued recommendations are as follows: self care behaviors, positive social engagements, focusing on overall work/home/life balance, and focusing on positive physical and emotional wellness.   Suicidal/Homicidal: No  Therapist Response: The patient continues to make good progress with self-understanding and self-insight. There is continued development of the patient's capacity for self-care and life management. Developments continue in the areas of family and relational functioning.   Plan: Return again in 8 weeks.The ongoing treatment plan includes therapeutic work on building and maintaining self-care; and addressing problematic coping strategies and mechanisms. Treatment continues to show good evolution and development. Treatment to continue as indicated.  Diagnosis:  Encounter Diagnosis  Name Primary?   MDD (major depressive disorder), recurrent episode, moderate ([place]) Yes    Collaboration of Care: Other pt to continue follow ups with PCP Dr. [facility]  Patient/Guardian was advised Release of Information must be obtained prior to any record release in order to collaborate their care with an outside provider. Patient/Guardian was advised if they have not already done so to contact the registration department to sign all necessary forms in order for [place] to release information regarding their care.   Consent: Patient/Guardian gives verbal consent for treatment and assignment of benefits for services provided during this visit. Patient/Guardian expressed understanding  and agreed to proceed.   [place], LCSW 11/10/2021

## 2021-11-10 NOTE — Plan of Care (Signed)
  Problem: Depression Goal:  Decrease depressive symptoms and improve levels of effective functioning-pt reports a decrease in overall depression symptoms 3 out of 5 sessions documented.  Outcome: Progressing Goal: Develop healthy thinking patterns and beliefs about self, others, and the world that lead to the alleviation and help prevent the relapse of depression per self report 3 out of 5 sessions documented.   Outcome: Progressing Intervention: Encourage verbalization of feelings/concerns/expectations Note: Allowed/encouraged Intervention: Encourage self-care activities Note: reviewed

## 2021-11-15 ENCOUNTER — Encounter (HOSPITAL_BASED_OUTPATIENT_CLINIC_OR_DEPARTMENT_OTHER): Payer: Medicare Other | Admitting: Internal Medicine

## 2021-11-15 DIAGNOSIS — L97512 Non-pressure 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: Secondary | ICD-10-CM

## 2021-11-15 DIAGNOSIS — E11621 Type 2 diabetes mellitus with foot ulcer: Secondary | ICD-10-CM | POA: Diagnosis not present

## 2021-11-15 DIAGNOSIS — E1142 Type 2 diabetes mellitus with diabetic polyneuropathy: Secondary | ICD-10-CM | POA: Diagnosis not present

## 2021-11-16 NOTE — Progress Notes (Signed)
[name], [name] ([id number]) Visit Report for 11/15/2021 Chief Complaint Document Details Patient Name: Date of Service: [name] [name] [name] [name] K. 11/15/2021 9:15 [name] [name]: [id number] Patient Account [name]: [id number] Date of Birth/Sex: Treating RN: Apr 25, 1955 (66 y.o. F) Primary Care Provider: Geoffery [name] Other [name]: Referring Provider: Treating Provider/Extender: [name] [name] in Treatment: 6 Information Obtained from: Patient Chief Complaint 10/27; Right Plantar foot wound 11/21; 2 small areas of skin breakdown to the right foot following cast placement 8/29; right plantar foot wound Electronic Signature(s) Signed: 11/15/2021 10:16:22 AM By: [name] Shan DO Entered By: [name] [name] on 11/15/2021 10:03:02 -------------------------------------------------------------------------------- Cellular or Tissue Based Product Details Patient Name: Date of Service: [name] [name] [name], [name] [name] K. 11/15/2021 9:15 [name] [name]: [id number] Patient Account [name]: [id number] Date of Birth/Sex: Treating RN: 1955-10-20 (66 y.o. [name] [name], [name] Primary Care Provider: Geoffery [name] Other [name]: Referring Provider: Treating Provider/Extender: [name] [name] in Treatment: 6 Cellular or Tissue Based Product Type Wound #4 Right,Plantar Metatarsal head first Applied to: Performed By: Physician [name] Shan, DO Cellular or Tissue Based Product Type: Grafix prime Level of Consciousness (Pre-procedure): Awake and Alert Pre-procedure Verification/Time Out Yes - 09:55 Taken: Location: genitalia / hands / feet / multiple digits Wound Size (sq cm): 0.35 Product Size (sq cm): 6 Waste Size (sq cm): 0 Amount of Product Applied (sq cm): 6 Instrument Used: Forceps, Scissors Lot #: [id number] Expiration Date: 03/13/2023 Fenestrated: No Reconstituted: Yes Solution Type: normal  saline Solution Amount: 61m Lot #: [id number] Expiration Date: 11/21/2021 Secured: Yes Secured With: Steri-Strips Dressing Applied: Yes Primary Dressing: Adaptic, gauze Procedural Pain: 0 Post Procedural Pain: 0 Response to Treatment: Procedure was tolerated well Level of Consciousness (Post- Awake and Alert procedure): Post Procedure Diagnosis Same as Pre-procedure Electronic Signature(s) Signed: 11/15/2021 2:02:51 PM By: HKalman ShanDO Signed: 11/16/2021 4:47:31 PM By: BRhae HammockRN Previous Signature: 11/15/2021 10:16:22 AM Version By: HKalman ShanDO Entered By: BRhae Hammockon 11/15/2021 12:23:00 -------------------------------------------------------------------------------- Debridement Details Patient Name: Date of Service: [name] [name] [name], [name] [name] K. 11/15/2021 9:15 [name] [name]: [id number] Account [name]: [id number] of Birth/Sex: Treating RN: 1Jul 22, 1957((66y.o. FTonita [name] [name] Primary Care Provider: AGeoffery LyonsOther [name]: Referring Provider: Treating Provider/Extender: HBaird Kayin Treatment: 6 Debridement Performed for Assessment: Wound #4 Right,Plantar Metatarsal head first Performed By: Physician HKalman Shan DO Debridement Type: Debridement Severity of Tissue Pre Debridement: Fat layer exposed Level of Consciousness (Pre-procedure): Awake and Alert Pre-procedure Verification/Time Out Yes - 09:50 Taken: Start Time: 09:50 T Area Debrided (L x W): otal 0.5 (cm) x 0.7 (cm) = 0.35 (cm) Tissue and other material debrided: Viable, Non-Viable, Slough, Subcutaneous, Slough Level: Skin/Subcutaneous Tissue Debridement Description: Excisional Instrument: Curette Bleeding: Minimum Hemostasis Achieved: Pressure End Time: 09:50 Procedural Pain: 0 Post Procedural Pain: 0 Response to Treatment: Procedure was tolerated well Level of Consciousness (Post- Awake and  Alert procedure): Post Debridement Measurements of Total Wound Length: (cm) 0.5 Width: (cm) 0.7 Depth: (cm) 0.1 Volume: (cm) 0.027 Character of Wound/Ulcer Post Debridement: Improved Severity of Tissue Post Debridement: Fat layer exposed Post Procedure Diagnosis Same as Pre-procedure Electronic Signature(s) Signed: 11/15/2021 10:16:22 AM By: HKalman ShanDO Signed: 11/16/2021 4:47:31 PM By: BRhae HammockRN Entered By: BRhae Hammockon 11/15/2021 09:55:47 -------------------------------------------------------------------------------- HPI Details Patient Name: [place] of [place] [name] [name] K. 11/15/2021 9:15 [name] [name] Medical Record  [name]: [id number] Patient Account [name]: [id number] Date of Birth/Sex: Treating RN: 1955-05-06 (66 y.o. F) Primary Care Provider: Geoffery [name] Other [name]: Referring Provider: Treating Provider/Extender: [name] [name] in Treatment: 6 History of Present Illness HPI Description: Admission 12/17/2020 [name] [name] is [name] 66 year old female with [name] past medical history of insulin-dependent type 2 diabetes, hypothyroidism and daily1 pack per day cigarette smoker the presents to the clinic for [name] 6-week history of nonhealing wound to the right first MTPJ. She has been following with Dr. Amalia [name], podiatry for this issue. She has been using silver alginate with dressing changes. She uses [name] postsurgical shoe and offloading pads. She currently denies signs of infection. 10/31; patient presents for follow-up. She tolerated the soft cast fine although she states that she felt her foot rolling to one side. She denies signs of infection. She would like to do the total contact cast today. 12/23/2020 upon evaluation today patient appears to be doing excellent in regard to her wound on the foot and she is in [name] total contact cast. I do think this is appropriate this is the first cast change which we are obliged to do to  ensure nothing is rubbing everything appears to be doing quite well and very pleased in that regard. 11/7; patient presents for follow-up. She had no issues with the cast. She denies signs of infection. 11/14; patient presents for 1 week follow-up. She has had no issues with the cast. She denies signs of infection. 11/21; patient presents for 1 week follow-up. She did develop 2 small areas of skin breakdown on either side of the right foot from the cast rubbing. She states she did not feel the wounds developing. She currently denies signs of infection. 11/28; patient presents for 1 week follow-up. She has no issues or complaints today. She denies pain or acute signs of infection. 12/12; patient presents for follow-up. She reports improvement to her right lateral foot wound. She has been using silver alginate to the area. She has no issues or complaints today. 12/19; patient presents for follow-up. She reports that her right lateral foot wound has healed. She has no issues or complaints today. 1/3; patient presents for follow-up. She has no issues or complaints today. She reports no open wounds. Readmission 10/01/2021 [name] [name] is [name] 66 year old female's with [name] past medical history of insulin-dependent controlled type 2 diabetes complicated by peripheral neuropathy that presents to the clinic for [name] 36-monthhistory of nonhealing ulcer to the right foot. I have seen her before for [name] wound to the same area that was treated and healed. Her wound today however this is much deeper and it has thick yellow drainage. She is not sure exactly how it started. She noticed it 1 day. She has been following with Dr. EAmalia Haileyfor this issue. She had [name] wound culture that showed [name] mix of organisms on 09/01/2021. She was recently started on doxycycline. She is using the dConservation officer, nature She has been using Iodosorb with dressing changes. She currently denies systemic signs of infection. 8/21; patient presents for  follow-up. She states she started and completed levofloxacin. She is still taking doxycycline. She reports improvement in drainage and odor. She has been doing Dakin's wet-to-dry dressings as well. She is using her front offloading shoe. She denies signs of infection. 8/29; patient presents for follow-up. She is still taking doxycycline. She continues to report improvement in drainage and reports no odor. She denies any purulent drainage. She has been  doing silver alginate to the wound bed and using her front offloading shoe. 9/5; patient presents for follow-up. She completed the course of levofloxacin. She has someone that is able to drive her today so we can place the total contact cast. She reports no signs of infection. We have been doing silver alginate to the wound bed. 9/7; patient presents for follow-up. She has tolerated the total contact cast well. She presents for her obligatory cast change. She has no issues or complaints today. 9/11; patient presents for follow-up. She tolerated the total contact cast well. She has no issues or complaints today. We discussed potentially doing [name] skin substitute and patient would like to see if her insurance will cover this. 9/18; patient presents for follow-up. She had no issues with the total contact cast. She been approved for Grafix and patient would like to have this placed today. 9/25; patient presents for follow-up. Grafix #1 was placed in standard fashion at last clinic visit. She has no issues or complaints today. She tolerated the cast well. Electronic Signature(s) Signed: 11/15/2021 10:16:22 AM By: [name] Shan DO Entered By: [name] [name] on 11/15/2021 10:03:38 -------------------------------------------------------------------------------- Physical Exam Details Patient Name: Date of Service: [name] [name] [name], [name] [name] K. 11/15/2021 9:15 [name] [name]: [id number] Patient Account [name]: [id number] Date of Birth/Sex: Treating  RN: June 17, 1955 (66 y.o. F) Primary Care Provider: Geoffery [name] Other [name]: Referring Provider: Treating Provider/Extender: [name] [name] in Treatment: 6 Constitutional respirations regular, non-labored and within target range for patient.. Cardiovascular 2+ dorsalis pedis/posterior tibialis pulses. Psychiatric pleasant and cooperative. Notes Right foot: T the first submetatarsal there is an open wound with granulation tissue and nonviable tissue. No undermining noted. No signs of surrounding o infection. Electronic Signature(s) Signed: 11/15/2021 10:16:22 AM By: [name] Shan DO Entered By: [name] [name] on 11/15/2021 10:04:21 -------------------------------------------------------------------------------- Physician Orders Details Patient Name: Date of Service: [name] [name] [name], [name] [name] K. 11/15/2021 9:15 [name] [name]: [id number] Patient Account [name]: [id number] Date of Birth/Sex: Treating RN: 1955-04-30 (66 y.o. [name] [name], [name] Primary Care Provider: Geoffery [name] Other [name]: Referring Provider: Treating Provider/Extender: [name] [name] in Treatment: 6 Verbal / Phone Orders: No Diagnosis Coding Follow-up Appointments ppointment in 1 week. - ***EXTRA TIME for skin sub application and [name] TCC*** Return [name] Monday w/ Dr. Heber [place] and Allayne Butcher, Room 9 Cellular or Tissue Based Products Wound #4 Right,Plantar Metatarsal head first Cellular or Tissue Based Product Type: - GRAFIX 16MM # 1 APPLIED 09//18//23 Grafix 2 x 3 # 1 APPLIED 11/15/21 Bathing/ Shower/ Hygiene [name] shower with protection but do not get wound dressing(s) wet. Edema Control - Lymphedema / SCD / Other Elevate legs to the level of the heart or above for 30 minutes daily and/or when sitting, [name] frequency of: Avoid standing for long periods of time. Off-Loading Total Contact Cast to Right Lower Extremity Wound  Treatment Wound #4 - Metatarsal head first Wound Laterality: Plantar, Right Cleanser: Soap and Water 1 x Per Week/7 Days Discharge Instructions: [name] shower and wash wound with dial antibacterial soap and water prior to dressing change. Cleanser: Wound Cleanser (Generic) 1 x Per Week/7 Days Discharge Instructions: Cleanse the wound with wound cleanser prior to applying [name] clean dressing using gauze sponges, not tissue or cotton balls. Prim Dressing: Maxorb Extra Calcium Alginate 2x2 in 1 x Per Week/7 Days ary Discharge Instructions: Apply calcium alginate over sorbact. Prim Dressing: Grafix skin sub ary 1 x Per Week/7  Days Secondary Dressing: Optifoam Non-Adhesive Dressing, 4x4 in (Generic) 1 x Per Week/7 Days Discharge Instructions: Apply [name] foam donut one layer over primary dressing as directed. Secondary Dressing: Woven Gauze Sponge, Non-Sterile 4x4 in (Generic) 1 x Per Week/7 Days Discharge Instructions: Apply over primary dressing as directed. Secured With: [employer], 4.5x3.1 (in/yd) (Generic) 1 x Per Week/7 Days Discharge Instructions: Secure with Kerlix as directed. Secured With: 67M Medipore H Soft Cloth Surgical T ape, 4 x 10 (in/yd) (Generic) 1 x Per Week/7 Days Discharge Instructions: Secure with tape as directed. Electronic Signature(s) Signed: 11/15/2021 10:16:22 AM By: [name] Shan DO Entered By: [name] [name] on 11/15/2021 10:04:30 -------------------------------------------------------------------------------- Problem List Details Patient Name: Date of Service: [name] [name] [name], [name] [name] K. 11/15/2021 9:15 [name] [name]: [id number] Patient Account [name]: [id number] Date of Birth/Sex: Treating RN: 10/23/1955 (66 y.o. F) Primary Care Provider: Geoffery [name] Other [name]: Referring Provider: Treating Provider/Extender: [name] [name] in Treatment: 6 Active Problems ICD-10 Encounter Code Description Active Date  MDM Diagnosis L97.512 Non-pressure 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 10/01/2021 No Yes E11.621 Type 2 diabetes mellitus with foot ulcer 10/01/2021 No Yes E11.42 Type 2 diabetes mellitus with diabetic polyneuropathy 10/01/2021 No Yes Inactive Problems Resolved Problems Electronic Signature(s) Signed: 11/15/2021 10:16:22 AM By: [name] Shan DO Entered By: [name] [name] on 11/15/2021 10:02:40 -------------------------------------------------------------------------------- Progress Note Details Patient Name: Date of Service: [place], [name] [name] K. 11/15/2021 9:15 [name] [name]: [id number] Patient Account [name]: [id number] Date of Birth/Sex: Treating RN: 1955-11-23 (66 y.o. F) Primary Care Provider: Geoffery [name] Other [name]: Referring Provider: Treating Provider/Extender: [name] [name] in Treatment: 6 Subjective Chief Complaint Information obtained from Patient 10/27; Right Plantar foot wound 11/21; 2 small areas of skin breakdown to the right foot following cast placement 8/29; right plantar foot wound History of Present Illness (HPI) Admission 12/17/2020 [name] [name] is [name] 66 year old female with [name] past medical history of insulin-dependent type 2 diabetes, hypothyroidism and daily1 pack per day cigarette smoker the presents to the clinic for [name] 6-week history of nonhealing wound to the right first MTPJ. She has been following with Dr. Amalia [name], podiatry for this issue. She has been using silver alginate with dressing changes. She uses [name] postsurgical shoe and offloading pads. She currently denies signs of infection. 10/31; patient presents for follow-up. She tolerated the soft cast fine although she states that she felt her foot rolling to one side. She denies signs of infection. She would like to do the total contact cast today. 12/23/2020 upon evaluation today patient appears to be doing excellent  in regard to her wound on the foot and she is in [name] total contact cast. I do think this is appropriate this is the first cast change which we are obliged to do to ensure nothing is rubbing everything appears to be doing quite well and very pleased in that regard. 11/7; patient presents for follow-up. She had no issues with the cast. She denies signs of infection. 11/14; patient presents for 1 week follow-up. She has had no issues with the cast. She denies signs of infection. 11/21; patient presents for 1 week follow-up. She did develop 2 small areas of skin breakdown on either side of the right foot from the cast rubbing. She states she did not feel the wounds developing. She currently denies signs of infection. 11/28; patient presents for 1 week follow-up. She  has no issues or complaints today. She denies pain or acute signs of infection. 12/12; patient presents for follow-up. She reports improvement to her right lateral foot wound. She has been using silver alginate to the area. She has no issues or complaints today. 12/19; patient presents for follow-up. She reports that her right lateral foot wound has healed. She has no issues or complaints today. 1/3; patient presents for follow-up. She has no issues or complaints today. She reports no open wounds. Readmission 10/01/2021 [name] [name] is [name] 66 year old female's with [name] past medical history of insulin-dependent controlled type 2 diabetes complicated by peripheral neuropathy that presents to the clinic for [name] 32-monthhistory of nonhealing ulcer to the right foot. I have seen her before for [name] wound to the same area that was treated and healed. Her wound today however this is much deeper and it has thick yellow drainage. She is not sure exactly how it started. She noticed it 1 day. She has been following with Dr. EAmalia Haileyfor this issue. She had [name] wound culture that showed [name] mix of organisms on 09/01/2021. She was recently started on doxycycline. She  is using the dConservation officer, nature She has been using Iodosorb with dressing changes. She currently denies systemic signs of infection. 8/21; patient presents for follow-up. She states she started and completed levofloxacin. She is still taking doxycycline. She reports improvement in drainage and odor. She has been doing Dakin's wet-to-dry dressings as well. She is using her front offloading shoe. She denies signs of infection. 8/29; patient presents for follow-up. She is still taking doxycycline. She continues to report improvement in drainage and reports no odor. She denies any purulent drainage. She has been doing silver alginate to the wound bed and using her front offloading shoe. 9/5; patient presents for follow-up. She completed the course of levofloxacin. She has someone that is able to drive her today so we can place the total contact cast. She reports no signs of infection. We have been doing silver alginate to the wound bed. 9/7; patient presents for follow-up. She has tolerated the total contact cast well. She presents for her obligatory cast change. She has no issues or complaints today. 9/11; patient presents for follow-up. She tolerated the total contact cast well. She has no issues or complaints today. We discussed potentially doing [name] skin substitute and patient would like to see if her insurance will cover this. 9/18; patient presents for follow-up. She had no issues with the total contact cast. She been approved for Grafix and patient would like to have this placed today. 9/25; patient presents for follow-up. Grafix #1 was placed in standard fashion at last clinic visit. She has no issues or complaints today. She tolerated the cast well. Patient History Information obtained from Patient, Chart. Family History Unknown History. Social History Current every day smoker - 1 pack/[name]/day, Alcohol Use - Never, Drug Use - No History, Caffeine Use - Moderate. Medical History Eyes Denies  history of Cataracts, Glaucoma, Optic Neuritis Ear/Nose/Mouth/Throat Denies history of Chronic sinus problems/congestion, Middle ear problems Hematologic/Lymphatic Denies history of Anemia, Hemophilia, Human Immunodeficiency Virus, Lymphedema, Sickle Cell Disease Respiratory Patient has history of Sleep Apnea Denies history of Aspiration, Asthma, Chronic Obstructive Pulmonary Disease (COPD), Pneumothorax, Tuberculosis Cardiovascular Patient has history of Coronary Artery Disease, Hypertension Denies history of Angina, Arrhythmia, Congestive Heart Failure, Deep Vein Thrombosis, Hypotension, Myocardial Infarction, Peripheral Arterial Disease, Peripheral Venous Disease, Phlebitis, Vasculitis Gastrointestinal Denies history of Cirrhosis , Colitis, Crohnoos, Hepatitis [name], Hepatitis B,  Hepatitis C Endocrine Patient has history of Type II Diabetes Denies history of Type I Diabetes Genitourinary Denies history of End Stage Renal Disease Immunological Denies history of Lupus Erythematosus, Raynaudoos, Scleroderma Integumentary (Skin) Denies history of History of Burn Musculoskeletal Denies history of Gout, Rheumatoid Arthritis, Osteoarthritis, Osteomyelitis Neurologic Patient has history of Neuropathy Denies history of Dementia, Quadriplegia, Paraplegia, Seizure Disorder Hospitalization/Surgery History - 2nd and 3rd right toe amps. - total hysterectomy. - cholecystectomy. - 2 knee surgery's on left and 1 on right. Medical [name] Surgical History Notes nd Cardiovascular hypercholesterolemia Objective Constitutional respirations regular, non-labored and within target range for patient.. Vitals Time Taken: 9:17 AM, Temperature: 98.7 F, Pulse: 74 bpm, Respiratory Rate: 17 breaths/min, Blood Pressure: 152/72 mmHg, Capillary Blood Glucose: 150 mg/dl. Cardiovascular 2+ dorsalis pedis/posterior tibialis pulses. Psychiatric pleasant and cooperative. General Notes: Right foot: T the first  submetatarsal there is an open wound with granulation tissue and nonviable tissue. No undermining noted. No signs of o surrounding infection. Integumentary (Hair, Skin) Wound #4 status is Open. Original cause of wound was Gradually Appeared. The date acquired was: 07/22/2021. The wound has been in treatment 6 weeks. The wound is located on the Right,Plantar Metatarsal head first. The wound measures 0.5cm length x 0.7cm width x 0.1cm depth; 0.275cm^2 area and 0.027cm^3 volume. There is Fat Layer (Subcutaneous Tissue) exposed. There is no tunneling or undermining noted. There is [name] large amount of serosanguineous drainage noted. The wound margin is distinct with the outline attached to the wound base. There is large (67-100%) red, pink, friable granulation within the wound bed. There is no necrotic tissue within the wound bed. Assessment Active Problems ICD-10 Non-pressure 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 Type 2 diabetes mellitus with foot ulcer Type 2 diabetes mellitus with diabetic polyneuropathy Patient's wound has shown improvement in size and appearance since last clinic visit. I debrided nonviable tissue. Grafix #2 was placed in standard fashion today. We will continue the total contact cast. Follow-up in 1 week. Procedures Wound #4 Pre-procedure diagnosis of Wound #4 is [name] Diabetic Wound/Ulcer of the Lower Extremity located on the Right,Plantar Metatarsal head first .Severity of Tissue Pre Debridement is: Fat layer exposed. There was [name] Excisional Skin/Subcutaneous Tissue Debridement with [name] total area of 0.35 sq cm performed by [name] Shan, DO. With the following instrument(s): Curette to remove Viable and Non-Viable tissue/material. Material removed includes Subcutaneous Tissue and Slough and. No specimens were taken. [name] time out was conducted at 09:50, prior to the start of the procedure. [name] Minimum amount of bleeding was controlled with Pressure. The  procedure was tolerated well with [name] pain level of 0 throughout and [name] pain level of 0 following the procedure. Post Debridement Measurements: 0.5cm length x 0.7cm width x 0.1cm depth; 0.027cm^3 volume. Character of Wound/Ulcer Post Debridement is improved. Severity of Tissue Post Debridement is: Fat layer exposed. Post procedure Diagnosis Wound #4: Same as Pre-Procedure Pre-procedure diagnosis of Wound #4 is [name] Diabetic Wound/Ulcer of the Lower Extremity located on the Right,Plantar Metatarsal head first. [name] skin graft procedure using [name] bioengineered skin substitute/cellular or tissue based product was performed by [name] Shan, DO with the following instrument(s): Forceps and Scissors. Grafix prime was applied and secured with Steri-Strips. 6 sq cm of product was utilized and 0 sq cm was wasted. Post Application, Adaptic, gauze was applied. [name] Time Out was conducted at 09:55, prior to the start of the procedure. The procedure was tolerated well with [name] pain level  of 0 throughout and [name] pain level of 0 following the procedure. Post procedure Diagnosis Wound #4: Same as Pre-Procedure . Pre-procedure diagnosis of Wound #4 is [name] Diabetic Wound/Ulcer of the Lower Extremity located on the Right,Plantar Metatarsal head first . There was [name] Total Contact Cast Procedure by [name] Shan, DO. Post procedure Diagnosis Wound #4: Same as Pre-Procedure Plan Follow-up Appointments: Return Appointment in 1 week. - ***EXTRA TIME for skin sub application and [name] TCC*** Monday w/ Dr. Heber [place] and Allayne Butcher, Room 9 Cellular or Tissue Based Products: Wound #4 Right,Plantar Metatarsal head first: Cellular or Tissue Based Product Type: - GRAFIX 16MM # 1 APPLIED 09//18//23 Grafix 2 x 3 # 1 APPLIED 11/15/21 Bathing/ Shower/ Hygiene: [name] shower with protection but do not get wound dressing(s) wet. Edema Control - Lymphedema / SCD / Other: Elevate legs to the level of the heart or above for 30 minutes daily and/or when  sitting, [name] frequency of: Avoid standing for long periods of time. Off-Loading: T Contact Cast to Right Lower Extremity otal WOUND #4: - Metatarsal head first Wound Laterality: Plantar, Right Cleanser: Soap and Water 1 x Per Week/7 Days Discharge Instructions: [name] shower and wash wound with dial antibacterial soap and water prior to dressing change. Cleanser: Wound Cleanser (Generic) 1 x Per Week/7 Days Discharge Instructions: Cleanse the wound with wound cleanser prior to applying [name] clean dressing using gauze sponges, not tissue or cotton balls. Prim Dressing: Maxorb Extra Calcium Alginate 2x2 in 1 x Per Week/7 Days ary Discharge Instructions: Apply calcium alginate over sorbact. Prim Dressing: Grafix skin sub 1 x Per Week/7 Days ary Secondary Dressing: Optifoam Non-Adhesive Dressing, 4x4 in (Generic) 1 x Per Week/7 Days Discharge Instructions: Apply [name] foam donut one layer over primary dressing as directed. Secondary Dressing: Woven Gauze Sponge, Non-Sterile 4x4 in (Generic) 1 x Per Week/7 Days Discharge Instructions: Apply over primary dressing as directed. Secured With: [employer], 4.5x3.1 (in/yd) (Generic) 1 x Per Week/7 Days Discharge Instructions: Secure with Kerlix as directed. Secured With: 68M Medipore H Soft Cloth Surgical T ape, 4 x 10 (in/yd) (Generic) 1 x Per Week/7 Days Discharge Instructions: Secure with tape as directed. 1. In office sharp debridement 2. Grafix #2 placed in standard fashion 3. T contact cast placed in standard fashion otal 4. Follow-up in 1 week Electronic Signature(s) Signed: 11/15/2021 10:16:22 AM By: [name] Shan DO Entered By: [name] [name] on 11/15/2021 10:05:24 -------------------------------------------------------------------------------- HxROS Details Patient Name: Date of Service: [name] [name] [name], [name] [name] K. 11/15/2021 9:15 [name] [name]: [id number] Patient Account [name]: [id number] Date of Birth/Sex: Treating  RN: 02-28-1955 (66 y.o. F) Primary Care Provider: Geoffery [name] Other [name]: Referring Provider: Treating Provider/Extender: [name] [name] in Treatment: 6 Information Obtained From Patient Chart Eyes Medical History: Negative for: Cataracts; Glaucoma; Optic Neuritis Ear/Nose/Mouth/Throat Medical History: Negative for: Chronic sinus problems/congestion; Middle ear problems Hematologic/Lymphatic Medical History: Negative for: Anemia; Hemophilia; Human Immunodeficiency Virus; Lymphedema; Sickle Cell Disease Respiratory Medical History: Positive for: Sleep Apnea Negative for: Aspiration; Asthma; Chronic Obstructive Pulmonary Disease (COPD); Pneumothorax; Tuberculosis Cardiovascular Medical History: Positive for: Coronary Artery Disease; Hypertension Negative for: Angina; Arrhythmia; Congestive Heart Failure; Deep Vein Thrombosis; Hypotension; Myocardial Infarction; Peripheral Arterial Disease; Peripheral Venous Disease; Phlebitis; Vasculitis Past Medical History Notes: hypercholesterolemia Gastrointestinal Medical History: Negative for: Cirrhosis ; Colitis; Crohns; Hepatitis [name]; Hepatitis B; Hepatitis C Endocrine Medical History: Positive for: Type II Diabetes Negative for: Type I Diabetes Genitourinary Medical History: Negative for: End Stage Renal  Disease Immunological Medical History: Negative for: Lupus Erythematosus; Raynauds; Scleroderma Integumentary (Skin) Medical History: Negative for: History of Burn Musculoskeletal Medical History: Negative for: Gout; Rheumatoid Arthritis; Osteoarthritis; Osteomyelitis Neurologic Medical History: Positive for: Neuropathy Negative for: Dementia; Quadriplegia; Paraplegia; Seizure Disorder Immunizations Pneumococcal Vaccine: Received Pneumococcal Vaccination: Yes Received Pneumococcal Vaccination On or After 60th Birthday: Yes Tetanus Vaccine: Last tetanus shot: 12/17/2020 Implantable  Devices No devices added Hospitalization / Surgery History Type of Hospitalization/Surgery 2nd and 3rd right toe amps total hysterectomy cholecystectomy 2 knee surgery's on left and 1 on right Family and Social History Unknown History: Yes; Current every day smoker - 1 pack/[name]/day; Alcohol Use: Never; Drug Use: No History; Caffeine Use: Moderate; Financial Concerns: No; Food, Clothing or Shelter Needs: No; Support System Lacking: No; Transportation Concerns: No Electronic Signature(s) Signed: 11/15/2021 10:16:22 AM By: [name] Shan DO Entered By: [name] [name] on 11/15/2021 10:03:43 -------------------------------------------------------------------------------- Total Contact Cast Details Patient Name: Date of Service: [name] [name] [name], [name] [name] K. 11/15/2021 9:15 [name] [name]: [id number] Patient Account [name]: [id number] Date of Birth/Sex: Treating RN: [birth year] (66 y.o. [name] [name], [name] Primary Care Provider: Geoffery [name] Other [name]: Referring Provider: Treating Provider/Extender: [name] [name] in Treatment: 6 T Contact Cast Applied for Wound Assessment: otal Wound #4 Right,Plantar Metatarsal head first Performed By: Physician [name] Shan, DO Post Procedure Diagnosis Same as Pre-procedure Electronic Signature(s) Signed: 11/15/2021 10:16:22 AM By: [name] Shan DO Signed: 11/16/2021 4:47:31 PM By: Rhae Hammock RN Entered By: Rhae Hammock on 11/15/2021 10:00:10 -------------------------------------------------------------------------------- SuperBill Details Patient Name: Date of Service: [name] [name] [name], [name] [name] K. 11/15/2021 Medical Record [name]: [id number] Patient Account [name]: [id number] Date of Birth/Sex: Treating RN: Jan 09, 1956 (66 y.o. F) Primary Care Provider: Geoffery [name] Other [name]: Referring Provider: Treating Provider/Extender: [name] [name] in  Treatment: 6 Diagnosis Coding ICD-10 Codes Code Description [phone] Non-pressure 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 E11.621 Type 2 diabetes mellitus with foot ulcer E11.42 Type 2 diabetes mellitus with diabetic polyneuropathy Facility Procedures CPT4 Code: 62831517 Description: (Facility Use Only) Grafix Prime 2x3 Modifier: Quantity: 6 CPT4 Code: [id number] Description: 06269 - SKIN SUB GRAFT FACE/NK/HF/G ICD-10 Diagnosis Description L97.512 Non-pressure 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 E11.621 Type 2 diabetes mellitus with foot ulcer Modifier: Quantity: 1 Physician Procedures : CPT4 Code Description Modifier [id number] - WC PHYS SKIN SUB GRAFT FACE/NK/HF/G ICD-10 Diagnosis Description L97.512 Non-pressure 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 E11.621 Type 2 diabetes mellitus with foot ulcer Quantity: 1 Electronic Signature(s) Signed: 11/15/2021 12:02:31 PM By: [name] Shan DO Signed: 11/16/2021 4:47:31 PM By: Rhae Hammock RN Previous Signature: 11/15/2021 10:16:22 AM Version By: [name] Shan DO Entered By: Rhae Hammock on 11/15/2021 10:58:02

## 2021-11-16 NOTE — Progress Notes (Signed)
[name], NADER ([id number]) Visit Report for 11/15/2021 Arrival Information Details Patient Name: Date of Service: [name] [name], [name] 11/15/2021 9:15 A M Medical Record Number: [id number] Patient Account Number: [id number] Date of Birth/Sex: Treating RN: [birth year] (66 y.o. [name], [name] Primary Care Dorna Mallet: [name] Other Clinician: Referring Reedy Biernat: Treating Kanoelani Dobies/Extender: [name] in Treatment: 6 Visit Information History Since Last Visit Added or deleted any medications: No Patient Arrived: Ambulatory Any new allergies or adverse reactions: No Arrival Time: 09:16 Had a fall or experienced change in No Accompanied By: self activities of daily living that may affect Transfer Assistance: None risk of falls: Patient Identification Verified: Yes Signs or symptoms of abuse/neglect since last visito No Secondary Verification Process Completed: Yes Hospitalized since last visit: No Patient Requires Transmission-Based Precautions: No Implantable device outside of the clinic excluding No Patient Has Alerts: No cellular tissue based products placed in the center since last visit: Has Dressing in Place as Prescribed: Yes Has Footwear/Offloading in Place as Prescribed: Yes Right: T Contact Cast otal Pain Present Now: No Electronic Signature(s) Signed: 11/16/2021 4:47:31 PM By: Rhae Hammock RN Entered By: Rhae Hammock on 11/15/2021 09:17:11 -------------------------------------------------------------------------------- Encounter Discharge Information Details Patient Name: Date of Service: [name] [name], [name] 11/15/2021 9:15 A M Medical Record Number: [id number] Patient Account Number: [id number] Date of Birth/Sex: Treating RN: [birth year] (66 y.o. [name], [name] Primary Care Javayah Magaw: [name] Other Clinician: Referring Krishawn Vanderweele: Treating Aarin Bluett/Extender: [name] in Treatment: 6 Encounter Discharge Information Items Post Procedure Vitals Discharge Condition: Stable Temperature (F): 98.7 Ambulatory Status: Ambulatory Pulse (bpm): 74 Discharge Destination: Home Respiratory Rate (breaths/min): 17 Transportation: Private Auto Blood Pressure (mmHg): 120/80 Accompanied By: self Schedule Follow-up Appointment: Yes Clinical Summary of Care: Patient Declined Electronic Signature(s) Signed: 11/16/2021 4:47:31 PM By: Rhae Hammock RN Entered By: Rhae Hammock on 11/15/2021 10:59:03 -------------------------------------------------------------------------------- Lower Extremity Assessment Details Patient Name: Date of Service: [name] [name], [name] 11/15/2021 9:15 A M Medical Record Number: [id number] Patient Account Number: [id number] Date of Birth/Sex: Treating RN: [birth year] (66 y.o. [name], [name] Primary Care Marlynn Hinckley: [name] Other Clinician: Referring Annajulia Lewing: Treating Malike Foglio/Extender: [name] in Treatment: 6 Edema Assessment Assessed: [name]: No] [Right: Yes] Edema: [Left: Ye] [Right: s] Calf Left: Right: Point of Measurement: From Medial Instep 45.5 cm Ankle Left: Right: Point of Measurement: From Medial Instep 24.5 cm Vascular Assessment Pulses: Dorsalis Pedis Palpable: [Right:Yes] Posterior Tibial Palpable: [Right:Yes] Electronic Signature(s) Signed: 11/16/2021 4:47:31 PM By: Rhae Hammock RN Entered By: Rhae Hammock on 11/15/2021 09:33:08 -------------------------------------------------------------------------------- Multi Wound Chart Details Patient Name: Date of Service: [name] [name], [name] 11/15/2021 9:15 A M Medical Record Number: [id number] Patient Account Number: [id number] Date of Birth/Sex: Treating RN: Nov 14, 1955 (66 y.o. F) Primary Care Neoma Uhrich: [name] Other Clinician: Referring Irish Piech: Treating  Emillio Ngo/Extender: [name] in Treatment: 6 Vital Signs Height(in): Capillary Blood Glucose(mg/dl): 150 Weight(lbs): Pulse(bpm): 81 Body Mass Index(BMI): Blood Pressure(mmHg): 152/72 Temperature(F): 98.7 Respiratory Rate(breaths/min): 17 Photos: [4:Right, Plantar Metatarsal head first] [N/A:N/A N/A] Wound Location: [4:Gradually Appeared] [N/A:N/A] Wounding Event: [4:Diabetic Wound/Ulcer of the Lower] [N/A:N/A] Primary Etiology: [4:Extremity Sleep Apnea, Coronary Artery] [N/A:N/A] Comorbid History: [4:Disease, Hypertension, Type II Diabetes, Neuropathy 07/22/2021] [N/A:N/A] Date Acquired: [4:6] [N/A:N/A] Weeks of Treatment: [4:Open] [N/A:N/A] Wound Status: [4:No] [N/A:N/A] Wound Recurrence: [4:Yes] [N/A:N/A] Pending A mputation on Presentation: [4:0.5x0.7x0.1] [N/A:N/A] Measurements L x W x  D (cm) [4:0.275] [N/A:N/A] A (cm) : rea [4:0.027] [N/A:N/A] Volume (cm) : [4:-287.30%] [N/A:N/A] % Reduction in A rea: [4:82.70%] [N/A:N/A] % Reduction in Volume: [4:Grade 2] [N/A:N/A] Classification: [4:Large] [N/A:N/A] Exudate A mount: [4:Serosanguineous] [N/A:N/A] Exudate Type: [4:red, brown] [N/A:N/A] Exudate Color: [4:Distinct, outline attached] [N/A:N/A] Wound Margin: [4:Large (67-100%)] [N/A:N/A] Granulation A mount: [4:Red, Pink, Friable] [N/A:N/A] Granulation Quality: [4:None Present (0%)] [N/A:N/A] Necrotic A mount: [4:Fat Layer (Subcutaneous Tissue): Yes N/A] Exposed Structures: [4:Fascia: No Tendon: No Muscle: No Joint: No Bone: No Large (67-100%)] [N/A:N/A] Epithelialization: [4:Debridement - Excisional] [N/A:N/A] Debridement: Pre-procedure Verification/Time Out 09:50 [N/A:N/A] Taken: [4:Subcutaneous, Slough] [N/A:N/A] Tissue Debrided: [4:Skin/Subcutaneous Tissue] [N/A:N/A] Level: [4:0.35] [N/A:N/A] Debridement A (sq cm): [4:rea Curette] [N/A:N/A] Instrument: [4:Minimum] [N/A:N/A] Bleeding: [4:Pressure] [N/A:N/A] Hemostasis A chieved:  [4:0] [N/A:N/A] Procedural Pain: [4:0] [N/A:N/A] Post Procedural Pain: [4:Procedure was tolerated well] [N/A:N/A] Debridement Treatment Response: [4:0.5x0.7x0.1] [N/A:N/A] Post Debridement Measurements L x W x D (cm) [4:0.027] [N/A:N/A] Post Debridement Volume: (cm) [4:Cellular or Tissue Based Product] [N/A:N/A] Procedures Performed: [4:Debridement T Contact Cast otal] Treatment Notes Electronic Signature(s) Signed: 11/15/2021 10:16:22 AM By: Kalman Shan DO Entered By: Kalman Shan on 11/15/2021 10:02:45 -------------------------------------------------------------------------------- [place] Details Patient Name: Date of Service: [name] [name], [name] 11/15/2021 9:15 A M Medical Record Number: [id number] Patient Account Number: [id number] Date of Birth/Sex: Treating RN: 06-08-1955 (66 y.o. [name], [name] Primary Care Yaniah Thiemann: [name] Other Clinician: Referring Oree Hislop: Treating Daxon Kyne/Extender: [name] in Treatment: 6 Active Inactive Wound/Skin Impairment Nursing Diagnoses: Impaired tissue integrity Knowledge deficit related to ulceration/compromised skin integrity Goals: Patient will have a decrease in wound volume by X% from date: (specify in notes) Date Initiated: 10/01/2021 Target Resolution Date: 11/20/2021 Goal Status: Active Patient/caregiver will verbalize understanding of skin care regimen Date Initiated: 10/01/2021 Target Resolution Date: 11/20/2021 Goal Status: Active Ulcer/skin breakdown will have a volume reduction of 30% by week 4 Date Initiated: 10/01/2021 Target Resolution Date: 11/20/2021 Goal Status: Active Interventions: Assess patient/caregiver ability to obtain necessary supplies Assess patient/caregiver ability to perform ulcer/skin care regimen upon admission and as needed Assess ulceration(s) every visit Notes: Electronic Signature(s) Signed: 11/16/2021 4:47:31 PM  By: Rhae Hammock RN Entered By: Rhae Hammock on 11/15/2021 10:55:41 -------------------------------------------------------------------------------- Pain Assessment Details Patient Name: Date of Service: [name] [name], [name] 11/15/2021 9:15 A M Medical Record Number: [id number] Patient Account Number: [id number] Date of Birth/Sex: Treating RN: 23-May-1955 (66 y.o. [name], [name] Primary Care Zigmund Linse: [name] Other Clinician: Referring Demario Faniel: Treating Anibal Quinby/Extender: [name] in Treatment: 6 Active Problems Location of Pain Severity and Description of Pain Patient Has Paino No Site Locations Pain Management and Medication Current Pain Management: Electronic Signature(s) Signed: 11/16/2021 4:47:31 PM By: Rhae Hammock RN Entered By: Rhae Hammock on 11/15/2021 09:18:12 -------------------------------------------------------------------------------- Patient/Caregiver Education Details Patient Name: Date of Service: [name] [name], [name] 9/25/2023andnbsp9:15 [place] Record Number: [id number] Patient Account Number: [id number] Date of Birth/Gender: Treating RN: Feb 22, 1955 (66 y.o. [name], [name] Primary Care Physician: [name] Other Clinician: Referring Physician: Treating Physician/Extender: [name] in Treatment: 6 Education Assessment Education Provided To: Patient Education Topics Provided Wound/Skin Impairment: Methods: Explain/Verbal Responses: Reinforcements needed, State content correctly Electronic Signature(s) Signed: 11/16/2021 4:47:31 PM By: Rhae Hammock RN Entered By: Rhae Hammock on 11/15/2021 10:56:57 -------------------------------------------------------------------------------- Wound Assessment Details Patient Name: Date of Service: [name] [name], [name] 11/15/2021 9:15 A M Medical Record Number:  [id number] Patient  Account Number: [id number] Date of Birth/Sex: Treating RN: 02/24/1955 (66 y.o. [name], [name] Primary Care Zoe Goonan: [name] Other Clinician: Referring Samanthan Dugo: Treating Jazmine Heckman/Extender: [name] in Treatment: 6 Wound Status Wound Number: 4 Primary Diabetic Wound/Ulcer of the Lower Extremity Etiology: Wound Location: Right, Plantar Metatarsal head first Wound Open Wounding Event: Gradually Appeared Status: Date Acquired: 07/22/2021 Comorbid Sleep Apnea, Coronary Artery Disease, Hypertension, Type II Weeks Of Treatment: 6 History: Diabetes, Neuropathy Clustered Wound: No Pending Amputation On Presentation Photos Wound Measurements Length: (cm) 0.5 Width: (cm) 0.7 Depth: (cm) 0.1 Area: (cm) 0.275 Volume: (cm) 0.027 % Reduction in Area: -287.3% % Reduction in Volume: 82.7% Epithelialization: Large (67-100%) Tunneling: No Undermining: No Wound Description Classification: Grade 2 Wound Margin: Distinct, outline attached Exudate Amount: Large Exudate Type: Serosanguineous Exudate Color: red, brown Foul Odor After Cleansing: No Slough/Fibrino No Wound Bed Granulation Amount: Large (67-100%) Exposed Structure Granulation Quality: Red, Pink, Friable Fascia Exposed: No Necrotic Amount: None Present (0%) Fat Layer (Subcutaneous Tissue) Exposed: Yes Tendon Exposed: No Muscle Exposed: No Joint Exposed: No Bone Exposed: No Treatment Notes Wound #4 (Metatarsal head first) Wound Laterality: Plantar, Right Cleanser Soap and Water Discharge Instruction: May shower and wash wound with dial antibacterial soap and water prior to dressing change. Wound Cleanser Discharge Instruction: Cleanse the wound with wound cleanser prior to applying a clean dressing using gauze sponges, not tissue or cotton balls. Peri-Wound Care Topical Primary Dressing Maxorb Extra Calcium Alginate 2x2 in Discharge Instruction:  Apply calcium alginate over sorbact. Grafix skin sub Secondary Dressing Optifoam Non-Adhesive Dressing, 4x4 in Discharge Instruction: Apply a foam donut one layer over primary dressing as directed. Woven Gauze Sponge, Non-Sterile 4x4 in Discharge Instruction: Apply over primary dressing as directed. Secured With [employer], 4.5x3.1 (in/yd) Discharge Instruction: Secure with Kerlix as directed. 2M Medipore H Soft Cloth Surgical T ape, 4 x 10 (in/yd) Discharge Instruction: Secure with tape as directed. Compression Wrap Compression Stockings Add-Ons Electronic Signature(s) Signed: 11/16/2021 4:47:31 PM By: Rhae Hammock RN Entered By: Rhae Hammock on 11/15/2021 09:36:11 -------------------------------------------------------------------------------- Vitals Details Patient Name: Date of Service: [name] [name], [name] 11/15/2021 9:15 A M Medical Record Number: [id number] Patient Account Number: [id number] Date of Birth/Sex: Treating RN: 07-18-1955 (66 y.o. [name], [name] Primary Care Lanai Conlee: [name] Other Clinician: Referring Charyl Minervini: Treating Azaylea Maves/Extender: [name] in Treatment: 6 Vital Signs Time Taken: 09:17 Temperature (F): 98.7 Pulse (bpm): 74 Respiratory Rate (breaths/min): 17 Blood Pressure (mmHg): 152/72 Capillary Blood Glucose (mg/dl): 150 Reference Range: 80 - 120 mg / dl Electronic Signature(s) Signed: 11/16/2021 4:47:31 PM By: Rhae Hammock RN Entered By: Rhae Hammock on 11/15/2021 09:18:06

## 2021-11-22 ENCOUNTER — Encounter (HOSPITAL_BASED_OUTPATIENT_CLINIC_OR_DEPARTMENT_OTHER): Payer: Medicare Other | Attending: Internal Medicine | Admitting: Internal Medicine

## 2021-11-22 DIAGNOSIS — E11621 Type 2 diabetes mellitus with foot ulcer: Secondary | ICD-10-CM | POA: Insufficient documentation

## 2021-11-22 DIAGNOSIS — F17210 Nicotine dependence, cigarettes, uncomplicated: Secondary | ICD-10-CM | POA: Diagnosis not present

## 2021-11-22 DIAGNOSIS — L97512 Non-pressure 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: Secondary | ICD-10-CM | POA: Insufficient documentation

## 2021-11-22 DIAGNOSIS — E1142 Type 2 diabetes mellitus with diabetic polyneuropathy: Secondary | ICD-10-CM | POA: Diagnosis not present

## 2021-11-22 NOTE — Progress Notes (Signed)
SARINITY, DICICCO ([id number]) Visit Report for 11/22/2021 Arrival Information Details Patient Name: Date of Service: [name] YNA RDLoni Muse [name]. 11/22/2021 9:15 A M Medical Record Number: [id number] Patient Account Number: [id number] Date of Birth/Sex: Treating RN: [birth year] (66 y.o. F) Primary Care Daviana Haymaker: [name] Other Clinician: Referring Hurman Ketelsen: Treating Caycee Wanat/Extender: [name] in Treatment: 7 Visit Information History Since Last Visit Added or deleted any medications: No Patient Arrived: Ambulatory Any new allergies or adverse reactions: No Arrival Time: 09:11 Had a fall or experienced change in No Accompanied By: self activities of daily living that may affect Transfer Assistance: None risk of falls: Patient Identification Verified: Yes Signs or symptoms of abuse/neglect since last No Secondary Verification Process Completed: Yes visito Patient Requires Transmission-Based Precautions: No Hospitalized since last visit: No Patient Has Alerts: No Implantable device outside of the clinic No excluding cellular tissue based products placed in the center since last visit: Has Dressing in Place as Prescribed: Yes Has Footwear/Offloading in Place as Yes Prescribed: Right: Removable Cast Walker/Walking Boot T Contact Cast otal Pain Present Now: No Electronic Signature(s) Signed: 11/22/2021 4:49:57 PM By: Erenest Blank Entered By: Erenest Blank on 11/22/2021 09:12:22 -------------------------------------------------------------------------------- Encounter Discharge Information Details Patient Name: Date of Service: [name] [name], [name] 11/22/2021 9:15 A M Medical Record Number: [id number] Patient Account Number: [id number] Date of Birth/Sex: Treating RN: 10-Oct-1955 (66 y.o. [name], [name] Primary Care Merrel Crabbe: [name] Other Clinician: Referring Miasha Emmons: Treating Alexsandria Kivett/Extender: [name] in Treatment: 7 Encounter Discharge Information Items Post Procedure Vitals Discharge Condition: Stable Temperature (F): 98.7 Ambulatory Status: Ambulatory Pulse (bpm): 74 Discharge Destination: Home Respiratory Rate (breaths/min): 17 Transportation: Private Auto Blood Pressure (mmHg): 120/80 Accompanied By: self Schedule Follow-up Appointment: Yes Clinical Summary of Care: Patient Declined Electronic Signature(s) Signed: 11/22/2021 4:12:00 PM By: Rhae Hammock RN Entered By: Rhae Hammock on 11/22/2021 10:25:51 -------------------------------------------------------------------------------- Lower Extremity Assessment Details Patient Name: Date of Service: [name] [name], [name] 11/22/2021 9:15 A M Medical Record Number: [id number] Patient Account Number: [id number] Date of Birth/Sex: Treating RN: 1956/01/19 (66 y.o. F) Primary Care Truth Wolaver: [name] Other Clinician: Referring Adlai Nieblas: Treating Alphonsa Brickle/Extender: [name] in Treatment: 7 Edema Assessment Assessed: [Left: No] [Right: No] Edema: [Left: Ye] [Right: s] Calf Left: Right: Point of Measurement: From Medial Instep 45 cm Ankle Left: Right: Point of Measurement: From Medial Instep 25.5 cm Vascular Assessment Pulses: Dorsalis Pedis Palpable: [Right:Yes] Electronic Signature(s) Signed: 11/22/2021 4:49:57 PM By: Erenest Blank Entered By: Erenest Blank on 11/22/2021 09:34:16 -------------------------------------------------------------------------------- Multi Wound Chart Details Patient Name: Date of Service: [name] [name], [name] 11/22/2021 9:15 A M Medical Record Number: [id number] Patient Account Number: [id number] Date of Birth/Sex: Treating RN: [birth year] (66 y.o. F) Primary Care [name]: [name] Other Clinician: Referring Oryn Casanova: Treating Veleda Mun/Extender: [name] in Treatment: 7 Vital Signs Height(in): Capillary Blood Glucose(mg/dl): 159 Weight(lbs): Pulse(bpm): 9 Body Mass Index(BMI): Blood Pressure(mmHg): 155/85 Temperature(F): 98 Respiratory Rate(breaths/min): 18 Photos: [4:Right, Plantar Metatarsal head first] [5:No Photos Right, Lateral Foot] [N/A:N/A N/A] Wound Location: [4:Gradually Appeared] [5:Blister] [N/A:N/A] Wounding Event: [4:Diabetic Wound/Ulcer of the Lower] [5:Diabetic Wound/Ulcer of the Lower] [N/A:N/A] Primary Etiology: [4:Extremity Sleep Apnea, Coronary Artery] [5:Extremity Sleep Apnea, Coronary Artery] [N/A:N/A] Comorbid History: [4:Disease, Hypertension, Type II Diabetes, Neuropathy 07/22/2021] [5:Disease, Hypertension, Type II Diabetes, Neuropathy 11/22/2021] [N/A:N/A] Date Acquired: [4:7] [5:0] [N/A:N/A] Weeks of Treatment: [4:Open] [5:Open] [N/A:N/A] Wound  Status: [4:No] [5:No] [N/A:N/A] Wound Recurrence: [4:Yes] [5:No] [N/A:N/A] Pending A mputation on Presentation: [4:0.1x0.1x0.1] [5:1.5x1.5x0.3] [N/A:N/A] Measurements L x W x [name] (cm) [4:0.008] [5:1.767] [N/A:N/A] A (cm) : rea [4:0.001] [5:0.53] [N/A:N/A] Volume (cm) : [4:88.70%] [5:-125.10%] [N/A:N/A] % Reduction in A rea: [4:99.40%] [5:-570.90%] [N/A:N/A] % Reduction in Volume: [4:Grade 2] [5:Grade 2] [N/A:N/A] Classification: [4:Large] [5:Medium] [N/A:N/A] Exudate A mount: [4:Serosanguineous] [5:Serosanguineous] [N/A:N/A] Exudate Type: [4:red, brown] [5:red, brown] [N/A:N/A] Exudate Color: [4:Distinct, outline attached] [5:Distinct, outline attached] [N/A:N/A] Wound Margin: [4:Large (67-100%)] [5:Large (67-100%)] [N/A:N/A] Granulation A mount: [4:Red, Pink, Friable] [5:Red, Pink] [N/A:N/A] Granulation Quality: [4:None Present (0%)] [5:Small (1-33%)] [N/A:N/A] Necrotic A mount: [4:Fat Layer (Subcutaneous Tissue): Yes Fat Layer (Subcutaneous Tissue): Yes N/A] Exposed Structures: [4:Fascia: No Tendon: No Muscle: No Joint: No Bone: No Large (67-100%)]  [5:Fascia: No Tendon: No Muscle: No Joint: No Bone: No None] [N/A:N/A] Epithelialization: [4:Debridement - Excisional] [5:Debridement - Selective/Open Wound N/A] Debridement: Pre-procedure Verification/Time Out 09:45 [5:09:45] [N/A:N/A] Taken: [4:Callus, Subcutaneous, Slough] [5:N/A] [N/A:N/A] Tissue Debrided: [4:Skin/Subcutaneous Tissue] [5:Skin/Epidermis] [N/A:N/A] Level: [4:0.25] [5:2.25] [N/A:N/A] Debridement A (sq cm): [4:rea Curette] [5:Curette] [N/A:N/A] Instrument: [4:Minimum] [5:Minimum] [N/A:N/A] Bleeding: [4:Pressure] [5:Pressure] [N/A:N/A] Hemostasis A chieved: [4:0] [5:0] [N/A:N/A] Procedural Pain: [4:0] [5:0] [N/A:N/A] Post Procedural Pain: [4:Procedure was tolerated well] [5:Procedure was tolerated well] [N/A:N/A] Debridement Treatment Response: [4:0.2x0.3x0.2] [5:1.5x1.5x0.3] [N/A:N/A] Post Debridement Measurements L x W x [name] (cm) [4:0.009] [5:0.53] [N/A:N/A] Post Debridement Volume: (cm) [4:Cellular or Tissue Based Product] [5:Debridement] [N/A:N/A] Procedures Performed: [4:Debridement T Contact Cast otal] Treatment Notes Wound #4 (Metatarsal head first) Wound Laterality: Plantar, Right Cleanser Soap and Water Discharge Instruction: May shower and wash wound with dial antibacterial soap and water prior to dressing change. Wound Cleanser Discharge Instruction: Cleanse the wound with wound cleanser prior to applying a clean dressing using gauze sponges, not tissue or cotton balls. Peri-Wound Care Topical Primary Dressing Maxorb Extra Calcium Alginate 2x2 in Discharge Instruction: Apply calcium alginate over sorbact. Grafix skin sub Secondary Dressing Optifoam Non-Adhesive Dressing, 4x4 in Discharge Instruction: Apply a foam donut one layer over primary dressing as directed. Woven Gauze Sponge, Non-Sterile 4x4 in Discharge Instruction: Apply over primary dressing as directed. Secured With [employer], 4.5x3.1 (in/yd) Discharge Instruction: Secure with  Kerlix as directed. 47M Medipore H Soft Cloth Surgical T ape, 4 x 10 (in/yd) Discharge Instruction: Secure with tape as directed. Compression Wrap Compression Stockings Add-Ons Wound #5 (Foot) Wound Laterality: Right, Lateral Cleanser Soap and Water Discharge Instruction: May shower and wash wound with dial antibacterial soap and water prior to dressing change. Peri-Wound Care Topical Gentamicin Discharge Instruction: As directed by physician Mupirocin Ointment Discharge Instruction: Apply Mupirocin (Bactroban) as instructed Primary Dressing Hydrofera Blue Ready Foam, 2.5 x2.5 in Discharge Instruction: Apply to wound bed as instructed Secondary Dressing Optifoam Non-Adhesive Dressing, 4x4 in Discharge Instruction: Apply over primary dressing as directed. Woven Gauze Sponge, Non-Sterile 4x4 in Discharge Instruction: Apply over primary dressing as directed. Secured With 47M Medipore H Soft Cloth Surgical T ape, 4 x 10 (in/yd) Discharge Instruction: Secure with tape as directed. Compression Wrap Compression Stockings Add-Ons Electronic Signature(s) Signed: 11/22/2021 1:44:59 PM By: Kalman Shan DO Entered By: Kalman Shan on 11/22/2021 11:22:04 -------------------------------------------------------------------------------- Multi-Disciplinary Care Plan Details Patient Name: Date of Service: [name] [name], [name] 11/22/2021 9:15 A M Medical Record Number: [id number] Patient Account Number: [id number] Date of Birth/Sex: Treating RN: 1955/12/20 (66 y.o. [name], [name] Primary Care Sladen Plancarte: [name] Other Clinician: Referring Kojo Liby: Treating Jontay Maston/Extender: [name] in Treatment:  7 Active Inactive Wound/Skin Impairment Nursing Diagnoses: Impaired tissue integrity Knowledge deficit related to ulceration/compromised skin integrity Goals: Patient will have a decrease in wound volume by X% from date: (specify in  notes) Date Initiated: 10/01/2021 Target Resolution Date: 12/18/2021 Goal Status: Active Patient/caregiver will verbalize understanding of skin care regimen Date Initiated: 10/01/2021 Target Resolution Date: 12/18/2021 Goal Status: Active Ulcer/skin breakdown will have a volume reduction of 30% by week 4 Date Initiated: 10/01/2021 Target Resolution Date: 12/18/2021 Goal Status: Active Interventions: Assess patient/caregiver ability to obtain necessary supplies Assess patient/caregiver ability to perform ulcer/skin care regimen upon admission and as needed Assess ulceration(s) every visit Notes: Electronic Signature(s) Signed: 11/22/2021 4:12:00 PM By: Rhae Hammock RN Entered By: Rhae Hammock on 11/22/2021 09:34:18 -------------------------------------------------------------------------------- Pain Assessment Details Patient Name: Date of Service: [name] [name], [name] 11/22/2021 9:15 A M Medical Record Number: [id number] Patient Account Number: [id number] Date of Birth/Sex: Treating RN: 29-Jun-1955 (66 y.o. F) Primary Care Gilles Trimpe: [name] Other Clinician: Referring Dion Parrow: Treating Salvadore Valvano/Extender: [name] in Treatment: 7 Active Problems Location of Pain Severity and Description of Pain Patient Has Paino No Site Locations Pain Management and Medication Current Pain Management: Electronic Signature(s) Signed: 11/22/2021 4:49:57 PM By: Erenest Blank Entered By: Erenest Blank on 11/22/2021 09:14:14 -------------------------------------------------------------------------------- Patient/Caregiver Education Details Patient Name: Date of Service: [name] [name], A DRIENNE Raliegh Ip 10/2/2023andnbsp9:15 [place] Record Number: [id number] Patient Account Number: [id number] Date of Birth/Gender: Treating RN: 1955/04/28 (66 y.o. [name], [name] Primary Care Physician: [name] Other Clinician: Referring  Physician: Treating Physician/Extender: [name] in Treatment: 7 Education Assessment Education Provided To: Patient Education Topics Provided Wound/Skin Impairment: Methods: Explain/Verbal Responses: Reinforcements needed, State content correctly Electronic Signature(s) Signed: 11/22/2021 4:12:00 PM By: Rhae Hammock RN Entered By: Rhae Hammock on 11/22/2021 09:34:32 -------------------------------------------------------------------------------- Wound Assessment Details Patient Name: Date of Service: [name] [name], [name] 11/22/2021 9:15 A M Medical Record Number: [id number] Patient Account Number: [id number] Date of Birth/Sex: Treating RN: 1955/09/08 (66 y.o. F) Primary Care Elida Harbin: [name] Other Clinician: Referring Norris Brumbach: Treating Katasha Riga/Extender: [name] in Treatment: 7 Wound Status Wound Number: 4 Primary Diabetic Wound/Ulcer of the Lower Extremity Etiology: Wound Location: Right, Plantar Metatarsal head first Wound Open Wounding Event: Gradually Appeared Status: Date Acquired: 07/22/2021 Comorbid Sleep Apnea, Coronary Artery Disease, Hypertension, Type II Weeks Of Treatment: 7 History: Diabetes, Neuropathy Clustered Wound: No Pending Amputation On Presentation Photos Wound Measurements Length: (cm) 0.1 Width: (cm) 0.1 Depth: (cm) 0.1 Area: (cm) 0.008 Volume: (cm) 0.001 % Reduction in Area: 88.7% % Reduction in Volume: 99.4% Epithelialization: Large (67-100%) Tunneling: No Undermining: No Wound Description Classification: Grade 2 Wound Margin: Distinct, outline attached Exudate Amount: Large Exudate Type: Serosanguineous Exudate Color: red, brown Foul Odor After Cleansing: No Slough/Fibrino No Wound Bed Granulation Amount: Large (67-100%) Exposed Structure Granulation Quality: Red, Pink, Friable Fascia Exposed: No Necrotic Amount: None Present (0%) Fat  Layer (Subcutaneous Tissue) Exposed: Yes Tendon Exposed: No Muscle Exposed: No Joint Exposed: No Bone Exposed: No Treatment Notes Wound #4 (Metatarsal head first) Wound Laterality: Plantar, Right Cleanser Soap and Water Discharge Instruction: May shower and wash wound with dial antibacterial soap and water prior to dressing change. Wound Cleanser Discharge Instruction: Cleanse the wound with wound cleanser prior to applying a clean dressing using gauze sponges, not tissue or cotton balls. Peri-Wound Care Topical Primary Dressing Maxorb Extra Calcium Alginate 2x2 in Discharge Instruction: Apply  calcium alginate over sorbact. Grafix skin sub Secondary Dressing Optifoam Non-Adhesive Dressing, 4x4 in Discharge Instruction: Apply a foam donut one layer over primary dressing as directed. Woven Gauze Sponge, Non-Sterile 4x4 in Discharge Instruction: Apply over primary dressing as directed. Secured With [employer], 4.5x3.1 (in/yd) Discharge Instruction: Secure with Kerlix as directed. 45M Medipore H Soft Cloth Surgical T ape, 4 x 10 (in/yd) Discharge Instruction: Secure with tape as directed. Compression Wrap Compression Stockings Add-Ons Electronic Signature(s) Signed: 11/22/2021 4:49:57 PM By: Erenest Blank Entered By: Erenest Blank on 11/22/2021 09:36:54 -------------------------------------------------------------------------------- Wound Assessment Details Patient Name: Date of Service: [name] [name], [name] 11/22/2021 9:15 A M Medical Record Number: [id number] Patient Account Number: [id number] Date of Birth/Sex: Treating RN: [birth year] (66 y.o. [name], [name] Primary Care Yaritzel Stange: Other Clinician: [name] Referring Pharrah Rottman: Treating Ahlaya Ende/Extender: [name] in Treatment: 7 Wound Status Wound Number: 5 Primary Diabetic Wound/Ulcer of the Lower Extremity Etiology: Wound Location: Right, Lateral  Foot Wound Open Wounding Event: Blister Status: Date Acquired: 11/22/2021 Comorbid Sleep Apnea, Coronary Artery Disease, Hypertension, Type II Weeks Of Treatment: 0 History: Diabetes, Neuropathy Clustered Wound: No Wound Measurements Length: (cm) 1.5 Width: (cm) 1.5 Depth: (cm) 0.3 Area: (cm) 1.767 Volume: (cm) 0.53 % Reduction in Area: -125.1% % Reduction in Volume: -570.9% Epithelialization: None Tunneling: No Undermining: No Wound Description Classification: Grade 2 Wound Margin: Distinct, outline attached Exudate Amount: Medium Exudate Type: Serosanguineous Exudate Color: red, brown Foul Odor After Cleansing: No Slough/Fibrino Yes Wound Bed Granulation Amount: Large (67-100%) Exposed Structure Granulation Quality: Red, Pink Fascia Exposed: No Necrotic Amount: Small (1-33%) Fat Layer (Subcutaneous Tissue) Exposed: Yes Tendon Exposed: No Muscle Exposed: No Joint Exposed: No Bone Exposed: No Treatment Notes Wound #5 (Foot) Wound Laterality: Right, Lateral Cleanser Soap and Water Discharge Instruction: May shower and wash wound with dial antibacterial soap and water prior to dressing change. Peri-Wound Care Topical Gentamicin Discharge Instruction: As directed by physician Mupirocin Ointment Discharge Instruction: Apply Mupirocin (Bactroban) as instructed Primary Dressing Hydrofera Blue Ready Foam, 2.5 x2.5 in Discharge Instruction: Apply to wound bed as instructed Secondary Dressing Optifoam Non-Adhesive Dressing, 4x4 in Discharge Instruction: Apply over primary dressing as directed. Woven Gauze Sponge, Non-Sterile 4x4 in Discharge Instruction: Apply over primary dressing as directed. Secured With 45M Medipore H Soft Cloth Surgical T ape, 4 x 10 (in/yd) Discharge Instruction: Secure with tape as directed. Compression Wrap Compression Stockings Add-Ons Electronic Signature(s) Signed: 11/22/2021 4:12:00 PM By: Rhae Hammock RN Entered By: Rhae Hammock on 11/22/2021 09:47:15 -------------------------------------------------------------------------------- Vitals Details Patient Name: Date of Service: [name] [name], [name] 11/22/2021 9:15 A M Medical Record Number: [id number] Patient Account Number: [id number] Date of Birth/Sex: Treating RN: April 10, 1955 (66 y.o. F) Primary Care Joelie Schou: [name] Other Clinician: Referring Allecia Bells: Treating Krishauna Schatzman/Extender: [name] in Treatment: 7 Vital Signs Time Taken: 09:13 Temperature (F): 98 Pulse (bpm): 92 Respiratory Rate (breaths/min): 18 Blood Pressure (mmHg): 155/85 Capillary Blood Glucose (mg/dl): 159 Reference Range: 80 - 120 mg / dl Electronic Signature(s) Signed: 11/22/2021 4:49:57 PM By: Erenest Blank Entered By: Erenest Blank on 11/22/2021 09:14:07

## 2021-11-22 NOTE — Progress Notes (Signed)
[name], [name] ([id number]) Visit Report for 11/22/2021 Chief Complaint Document Details Patient Name: Date of Service: MA [name] 11/22/2021 9:15 A M Medical Record Number: [id number] Patient Account Number: [id number] Date of Birth/Sex: Treating RN: 1955-11-21 (66 y.o. F) Primary Care Provider: [name] Other Clinician: Referring Provider: Treating Provider/Extender: [name] in Treatment: 7 Information Obtained from: Patient Chief Complaint 10/27; Right Plantar foot wound 11/21; 2 small areas of skin breakdown to the right foot following cast placement 8/29; right plantar foot wound Electronic Signature(s) Signed: 11/22/2021 1:44:59 PM By: Kalman Shan DO Entered By: Kalman Shan on 11/22/2021 11:22:14 -------------------------------------------------------------------------------- Cellular or Tissue Based Product Details Patient Name: Date of Service: [name], [name] 11/22/2021 9:15 A M Medical Record Number: [id number] Patient Account Number: [id number] Date of Birth/Sex: Treating RN: April 04, 1955 (66 y.o. [name], [name] Primary Care Provider: [name] Other Clinician: Referring Provider: Treating Provider/Extender: [name] in Treatment: 7 Cellular or Tissue Based Product Type Wound #4 Right,Plantar Metatarsal head first Applied to: Performed By: Physician Kalman Shan, DO Cellular or Tissue Based Product Type: Grafix prime Level of Consciousness (Pre-procedure): Awake and Alert Pre-procedure Verification/Time Out Yes - 10:00 Taken: Location: genitalia / hands / feet / multiple digits Wound Size (sq cm): 0.01 Product Size (sq cm): 6 Waste Size (sq cm): 0 Amount of Product Applied (sq cm): 6 Instrument Used: Forceps, Scissors Lot #: [id number] Expiration Date: 03/27/2023 Fenestrated: No Reconstituted: Yes Solution Type: NS Solution Amount:  3ML Lot #: [id number] Solution Expiration Date: 06/20/2024 Secured: Yes Secured With: Steri-Strips Dressing Applied: Yes Primary Dressing: ADAPTIC Procedural Pain: 0 Post Procedural Pain: 0 Response to Treatment: Procedure was tolerated well Level of Consciousness (Post- Awake and Alert procedure): Post Procedure Diagnosis Same as Pre-procedure Electronic Signature(s) Signed: 11/22/2021 1:44:59 PM By: Kalman Shan DO Signed: 11/22/2021 4:12:00 PM By: Rhae Hammock RN Entered By: Rhae Hammock on 11/22/2021 10:28:10 -------------------------------------------------------------------------------- Debridement Details Patient Name: Date of Service: [place], [name] 11/22/2021 9:15 A M Medical Record Number: [id number] Patient Account Number: [id number] Date of Birth/Sex: Treating RN: 12-Jun-1955 (66 y.o. [name], [name] Primary Care Provider: [name] Other Clinician: Referring Provider: Treating Provider/Extender: [name] in Treatment: 7 Debridement Performed for Assessment: Wound #4 Right,Plantar Metatarsal head first Performed By: Physician Kalman Shan, DO Debridement Type: Debridement Severity of Tissue Pre Debridement: Fat layer exposed Level of Consciousness (Pre-procedure): Awake and Alert Pre-procedure Verification/Time Out Yes - 09:45 Taken: Start Time: 09:45 T Area Debrided (L x W): otal 0.5 (cm) x 0.5 (cm) = 0.25 (cm) Tissue and other material debrided: Viable, Non-Viable, Callus, Slough, Subcutaneous, Slough Level: Skin/Subcutaneous Tissue Debridement Description: Excisional Instrument: Curette Bleeding: Minimum Hemostasis Achieved: Pressure End Time: 09:45 Procedural Pain: 0 Post Procedural Pain: 0 Response to Treatment: Procedure was tolerated well Level of Consciousness (Post- Awake and Alert procedure): Post Debridement Measurements of Total Wound Length: (cm) 0.2 Width: (cm)  0.3 Depth: (cm) 0.2 Volume: (cm) 0.009 Character of Wound/Ulcer Post Debridement: Improved Severity of Tissue Post Debridement: Fat layer exposed Post Procedure Diagnosis Same as Pre-procedure Electronic Signature(s) Signed: 11/22/2021 1:44:59 PM By: Kalman Shan DO Signed: 11/22/2021 4:12:00 PM By: Rhae Hammock RN Entered By: Rhae Hammock on 11/22/2021 10:28:39 -------------------------------------------------------------------------------- Debridement Details Patient Name: Date of Service: [place], A [place]. 11/22/2021 9:15 A M Medical Record Number: [id number] Patient Account Number: [id number] Date of Birth/Sex: Treating RN:  1955-12-14 (66 y.o. [name], [name] Primary Care Provider: [name] Other Clinician: Referring Provider: Treating Provider/Extender: [name] in Treatment: 7 Debridement Performed for Assessment: Wound #5 Right,Lateral Foot Performed By: Physician Kalman Shan, DO Debridement Type: Debridement Severity of Tissue Pre Debridement: Fat layer exposed Level of Consciousness (Pre-procedure): Awake and Alert Pre-procedure Verification/Time Out Yes - 09:45 Taken: Start Time: 09:45 T Area Debrided (L x W): otal 1.5 (cm) x 1.5 (cm) = 2.25 (cm) Tissue and other material debrided: Viable, Non-Viable, Skin: Dermis , Skin: Epidermis Level: Skin/Epidermis Debridement Description: Selective/Open Wound Instrument: Curette Bleeding: Minimum Hemostasis Achieved: Pressure End Time: 09:45 Procedural Pain: 0 Post Procedural Pain: 0 Response to Treatment: Procedure was tolerated well Level of Consciousness (Post- Awake and Alert procedure): Post Debridement Measurements of Total Wound Length: (cm) 1.5 Width: (cm) 1.5 Depth: (cm) 0.3 Volume: (cm) 0.53 Character of Wound/Ulcer Post Debridement: Improved Severity of Tissue Post Debridement: Fat layer exposed Post Procedure Diagnosis Same as  Pre-procedure Electronic Signature(s) Signed: 11/22/2021 1:44:59 PM By: Kalman Shan DO Signed: 11/22/2021 4:12:00 PM By: Rhae Hammock RN Entered By: Rhae Hammock on 11/22/2021 10:28:47 -------------------------------------------------------------------------------- HPI Details Patient Name: Date of Service: [place], [name] 11/22/2021 9:15 A M Medical Record Number: [id number] Patient Account Number: [id number] Date of Birth/Sex: Treating RN: 06-03-1955 (66 y.o. F) Primary Care Provider: [name] Other Clinician: Referring Provider: Treating Provider/Extender: [name] in Treatment: 7 History of Present Illness HPI Description: Admission 12/17/2020 Ms. Cordia Miklos is a 66 year old female with a past medical history of insulin-dependent type 2 diabetes, hypothyroidism and daily1 pack per day cigarette smoker the presents to the clinic for a 6-week history of nonhealing wound to the right first MTPJ. She has been following with [name], podiatry for this issue. She has been using silver alginate with dressing changes. She uses a postsurgical shoe and offloading pads. She currently denies signs of infection. 10/31; patient presents for follow-up. She tolerated the soft cast fine although she states that she felt her foot rolling to one side. She denies signs of infection. She would like to do the total contact cast today. 12/23/2020 upon evaluation today patient appears to be doing excellent in regard to her wound on the foot and she is in a total contact cast. I do think this is appropriate this is the first cast change which we are obliged to do to ensure nothing is rubbing everything appears to be doing quite well and very pleased in that regard. 11/7; patient presents for follow-up. She had no issues with the cast. She denies signs of infection. 11/14; patient presents for 1 week follow-up. She has had no issues with the  cast. She denies signs of infection. 11/21; patient presents for 1 week follow-up. She did develop 2 small areas of skin breakdown on either side of the right foot from the cast rubbing. She states she did not feel the wounds developing. She currently denies signs of infection. 11/28; patient presents for 1 week follow-up. She has no issues or complaints today. She denies pain or acute signs of infection. 12/12; patient presents for follow-up. She reports improvement to her right lateral foot wound. She has been using silver alginate to the area. She has no issues or complaints today. 12/19; patient presents for follow-up. She reports that her right lateral foot wound has healed. She has no issues or complaints today. 1/3; patient presents for follow-up. She has no  issues or complaints today. She reports no open wounds. Readmission 10/01/2021 Ms. Laurianne Floresca is a 66 year old female's with a past medical history of insulin-dependent controlled type 2 diabetes complicated by peripheral neuropathy that presents to the clinic for a 31-monthhistory of nonhealing ulcer to the right foot. I have seen her before for a wound to the same area that was treated and healed. Her wound today however this is much deeper and it has thick yellow drainage. She is not sure exactly how it started. She noticed it 1 day. She has been following with Dr. EAmalia Haileyfor this issue. She had a wound culture that showed a mix of organisms on 09/01/2021. She was recently started on doxycycline. She is using the dConservation officer, nature She has been using Iodosorb with dressing changes. She currently denies systemic signs of infection. 8/21; patient presents for follow-up. She states she started and completed levofloxacin. She is still taking doxycycline. She reports improvement in drainage and odor. She has been doing Dakin's wet-to-dry dressings as well. She is using her front offloading shoe. She denies signs of infection. 8/29; patient  presents for follow-up. She is still taking doxycycline. She continues to report improvement in drainage and reports no odor. She denies any purulent drainage. She has been doing silver alginate to the wound bed and using her front offloading shoe. 9/5; patient presents for follow-up. She completed the course of levofloxacin. She has someone that is able to drive her today so we can place the total contact cast. She reports no signs of infection. We have been doing silver alginate to the wound bed. 9/7; patient presents for follow-up. She has tolerated the total contact cast well. She presents for her obligatory cast change. She has no issues or complaints today. 9/11; patient presents for follow-up. She tolerated the total contact cast well. She has no issues or complaints today. We discussed potentially doing a skin substitute and patient would like to see if her insurance will cover this. 9/18; patient presents for follow-up. She had no issues with the total contact cast. She been approved for Grafix and patient would like to have this placed today. 9/25; patient presents for follow-up. Grafix #1 was placed in standard fashion at last clinic visit. She has no issues or complaints today. She tolerated the cast well. 10/2; patient presents for follow-up. Grafix #2 was placed in standard fashion at last clinic visit. Unfortunately she developed a wound to the lateral aspect of the foot over the past week. She states she has been on her feet a lot more and walking more. She denies systemic signs of infection. Electronic Signature(s) Signed: 11/22/2021 1:44:59 PM By: HKalman ShanDO Entered By: HKalman Shanon 11/22/2021 11:23:03 -------------------------------------------------------------------------------- Physical Exam Details Patient Name: Date of Service: [name], [name] 11/22/2021 9:15 A M Medical Record Number: [id number] Account Number: [id number] of Birth/Sex:  Treating RN: [birth year]55[birth year]((66y.o. F) Primary Care Provider: AGeoffery LyonsOther Clinician: Referring Provider: Treating Provider/Extender: HBaird Kayin Treatment: 7 Constitutional respirations regular, non-labored and within target range for patient.. Cardiovascular 2+ dorsalis pedis/posterior tibialis pulses. Psychiatric pleasant and cooperative. Notes Right foot: T the first submetatarsal there is an open wound with granulation tissue and nonviable tissue. No undermining noted. No signs of surrounding o infection. T the lateral aspect there is an open wound with devitalized tissue. Postdebridement there appears to be well-healing tissue. No signs of surrounding o infection. Electronic Signature(s) Signed: 11/22/2021  1:44:59 PM By: Kalman Shan DO Entered By: Kalman Shan on 11/22/2021 11:24:03 -------------------------------------------------------------------------------- Physician Orders Details Patient Name: Date of Service: [name], [name] 11/22/2021 9:15 A M Medical Record Number: [id number] Patient Account Number: [id number] Date of Birth/Sex: Treating RN: April 30, 1955 (66 y.o. [name], [name] Primary Care Provider: [name] Other Clinician: Referring Provider: Treating Provider/Extender: [name] in Treatment: 7 Verbal / Phone Orders: No Diagnosis Coding Follow-up Appointments ppointment in 1 week. - ***EXTRA TIME for skin sub application and a TCC*** Return A Monday w/ Dr. Heber Evergreen and Allayne Butcher, Room 9 Cellular or Tissue Based Products Wound #4 Right,Plantar Metatarsal head first Cellular or Tissue Based Product Type: - GRAFIX 16MM # 1 APPLIED 09//18//23 Grafix 2 x 3 # 1 APPLIED 11/15/21 Grafix 2 x 3 # 2 APPLIED 11/22/21 Bathing/ Shower/ Hygiene May shower with protection but do not get wound dressing(s) wet. Edema Control - Lymphedema / SCD / Other Elevate legs to the  level of the heart or above for 30 minutes daily and/or when sitting, a frequency of: Avoid standing for long periods of time. Off-Loading Total Contact Cast to Right Lower Extremity Wound Treatment Wound #4 - Metatarsal head first Wound Laterality: Plantar, Right Cleanser: Soap and Water 1 x Per Week/7 Days Discharge Instructions: May shower and wash wound with dial antibacterial soap and water prior to dressing change. Cleanser: Wound Cleanser (Generic) 1 x Per Week/7 Days Discharge Instructions: Cleanse the wound with wound cleanser prior to applying a clean dressing using gauze sponges, not tissue or cotton balls. Prim Dressing: Maxorb Extra Calcium Alginate 2x2 in 1 x Per Week/7 Days ary Discharge Instructions: Apply calcium alginate over sorbact. Prim Dressing: Grafix skin sub ary 1 x Per Week/7 Days Secondary Dressing: Optifoam Non-Adhesive Dressing, 4x4 in (Generic) 1 x Per Week/7 Days Discharge Instructions: Apply a foam donut one layer over primary dressing as directed. Secondary Dressing: Woven Gauze Sponge, Non-Sterile 4x4 in (Generic) 1 x Per Week/7 Days Discharge Instructions: Apply over primary dressing as directed. Secured With: [employer], 4.5x3.1 (in/yd) (Generic) 1 x Per Week/7 Days Discharge Instructions: Secure with Kerlix as directed. Secured With: 61M Medipore H Soft Cloth Surgical T ape, 4 x 10 (in/yd) (Generic) 1 x Per Week/7 Days Discharge Instructions: Secure with tape as directed. Wound #5 - Foot Wound Laterality: Right, Lateral Cleanser: Soap and Water 1 x Per Week/7 Days Discharge Instructions: May shower and wash wound with dial antibacterial soap and water prior to dressing change. Topical: Gentamicin 1 x Per Week/7 Days Discharge Instructions: As directed by physician Topical: Mupirocin Ointment 1 x Per Week/7 Days Discharge Instructions: Apply Mupirocin (Bactroban) as instructed Prim Dressing: Hydrofera Blue Ready Foam, 2.5 x2.5 in 1 x Per  Week/7 Days ary Discharge Instructions: Apply to wound bed as instructed Secondary Dressing: Optifoam Non-Adhesive Dressing, 4x4 in 1 x Per Week/7 Days Discharge Instructions: Apply over primary dressing as directed. Secondary Dressing: Woven Gauze Sponge, Non-Sterile 4x4 in 1 x Per Week/7 Days Discharge Instructions: Apply over primary dressing as directed. Secured With: 61M Medipore H Soft Cloth Surgical T ape, 4 x 10 (in/yd) 1 x Per Week/7 Days Discharge Instructions: Secure with tape as directed. Electronic Signature(s) Signed: 11/22/2021 1:44:59 PM By: Kalman Shan DO Entered By: Kalman Shan on 11/22/2021 11:24:10 -------------------------------------------------------------------------------- Problem List Details Patient Name: Date of Service: [name], [name] 11/22/2021 9:15 A M Medical Record Number: [id number] Patient Account Number: [id number] Date of Birth/Sex: Treating  RN: 1955-06-26 (66 y.o. F) Primary Care Provider: [name] Other Clinician: Referring Provider: Treating Provider/Extender: [name] in Treatment: 7 Active Problems ICD-10 Encounter Code Description Active Date MDM Diagnosis L97.512 Non-pressure 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 10/01/2021 No Yes E11.621 Type 2 diabetes mellitus with foot ulcer 10/01/2021 No Yes E11.42 Type 2 diabetes mellitus with diabetic polyneuropathy 10/01/2021 No Yes Inactive Problems Resolved Problems Electronic Signature(s) Signed: 11/22/2021 1:44:59 PM By: Kalman Shan DO Entered By: Kalman Shan on 11/22/2021 11:21:59 -------------------------------------------------------------------------------- Progress Note Details Patient Name: Date of Service: [place], [name] 11/22/2021 9:15 A M Medical Record Number: [id number] Patient Account Number: [id number] Date of Birth/Sex: Treating RN: 03/07/1955 (66 y.o. F) Primary Care  Provider: [name] Other Clinician: Referring Provider: Treating Provider/Extender: [name] in Treatment: 7 Subjective Chief Complaint Information obtained from Patient 10/27; Right Plantar foot wound 11/21; 2 small areas of skin breakdown to the right foot following cast placement 8/29; right plantar foot wound History of Present Illness (HPI) Admission 12/17/2020 Ms. Miyuki Rzasa is a 66 year old female with a past medical history of insulin-dependent type 2 diabetes, hypothyroidism and daily1 pack per day cigarette smoker the presents to the clinic for a 6-week history of nonhealing wound to the right first MTPJ. She has been following with [name], podiatry for this issue. She has been using silver alginate with dressing changes. She uses a postsurgical shoe and offloading pads. She currently denies signs of infection. 10/31; patient presents for follow-up. She tolerated the soft cast fine although she states that she felt her foot rolling to one side. She denies signs of infection. She would like to do the total contact cast today. 12/23/2020 upon evaluation today patient appears to be doing excellent in regard to her wound on the foot and she is in a total contact cast. I do think this is appropriate this is the first cast change which we are obliged to do to ensure nothing is rubbing everything appears to be doing quite well and very pleased in that regard. 11/7; patient presents for follow-up. She had no issues with the cast. She denies signs of infection. 11/14; patient presents for 1 week follow-up. She has had no issues with the cast. She denies signs of infection. 11/21; patient presents for 1 week follow-up. She did develop 2 small areas of skin breakdown on either side of the right foot from the cast rubbing. She states she did not feel the wounds developing. She currently denies signs of infection. 11/28; patient presents for 1  week follow-up. She has no issues or complaints today. She denies pain or acute signs of infection. 12/12; patient presents for follow-up. She reports improvement to her right lateral foot wound. She has been using silver alginate to the area. She has no issues or complaints today. 12/19; patient presents for follow-up. She reports that her right lateral foot wound has healed. She has no issues or complaints today. 1/3; patient presents for follow-up. She has no issues or complaints today. She reports no open wounds. Readmission 10/01/2021 Ms. Phylisha Dix is a 66 year old female's with a past medical history of insulin-dependent controlled type 2 diabetes complicated by peripheral neuropathy that presents to the clinic for a 16-monthhistory of nonhealing ulcer to the right foot. I have seen her before for a wound to the same area that was treated and healed. Her wound today however  this is much deeper and it has thick yellow drainage. She is not sure exactly how it started. She noticed it 1 day. She has been following with [name] for this issue. She had a wound culture that showed a mix of organisms on 09/01/2021. She was recently started on doxycycline. She is using the Conservation officer, nature. She has been using Iodosorb with dressing changes. She currently denies systemic signs of infection. 8/21; patient presents for follow-up. She states she started and completed levofloxacin. She is still taking doxycycline. She reports improvement in drainage and odor. She has been doing Dakin's wet-to-dry dressings as well. She is using her front offloading shoe. She denies signs of infection. 8/29; patient presents for follow-up. She is still taking doxycycline. She continues to report improvement in drainage and reports no odor. She denies any purulent drainage. She has been doing silver alginate to the wound bed and using her front offloading shoe. 9/5; patient presents for follow-up. She completed the course of  levofloxacin. She has someone that is able to drive her today so we can place the total contact cast. She reports no signs of infection. We have been doing silver alginate to the wound bed. 9/7; patient presents for follow-up. She has tolerated the total contact cast well. She presents for her obligatory cast change. She has no issues or complaints today. 9/11; patient presents for follow-up. She tolerated the total contact cast well. She has no issues or complaints today. We discussed potentially doing a skin substitute and patient would like to see if her insurance will cover this. 9/18; patient presents for follow-up. She had no issues with the total contact cast. She been approved for Grafix and patient would like to have this placed today. 9/25; patient presents for follow-up. Grafix #1 was placed in standard fashion at last clinic visit. She has no issues or complaints today. She tolerated the cast well. 10/2; patient presents for follow-up. Grafix #2 was placed in standard fashion at last clinic visit. Unfortunately she developed a wound to the lateral aspect of the foot over the past week. She states she has been on her feet a lot more and walking more. She denies systemic signs of infection. Patient History Information obtained from Patient, Chart. Family History Unknown History. Social History Current every day smoker - 1 pack/a/day, Alcohol Use - Never, Drug Use - No History, Caffeine Use - Moderate. Medical History Eyes Denies history of Cataracts, Glaucoma, Optic Neuritis Ear/Nose/Mouth/Throat Denies history of Chronic sinus problems/congestion, Middle ear problems Hematologic/Lymphatic Denies history of Anemia, Hemophilia, Human Immunodeficiency Virus, Lymphedema, Sickle Cell Disease Respiratory Patient has history of Sleep Apnea Denies history of Aspiration, Asthma, Chronic Obstructive Pulmonary Disease (COPD), Pneumothorax, Tuberculosis Cardiovascular Patient has history  of Coronary Artery Disease, Hypertension Denies history of Angina, Arrhythmia, Congestive Heart Failure, Deep Vein Thrombosis, Hypotension, Myocardial Infarction, Peripheral Arterial Disease, Peripheral Venous Disease, Phlebitis, Vasculitis Gastrointestinal Denies history of Cirrhosis , Colitis, Crohnoos, Hepatitis A, Hepatitis B, Hepatitis C Endocrine Patient has history of Type II Diabetes Denies history of Type I Diabetes Genitourinary Denies history of End Stage Renal Disease Immunological Denies history of Lupus Erythematosus, Raynaudoos, Scleroderma Integumentary (Skin) Denies history of History of Burn Musculoskeletal Denies history of Gout, Rheumatoid Arthritis, Osteoarthritis, Osteomyelitis Neurologic Patient has history of Neuropathy Denies history of Dementia, Quadriplegia, Paraplegia, Seizure Disorder Hospitalization/Surgery History - 2nd and 3rd right toe amps. - total hysterectomy. - cholecystectomy. - 2 knee surgery's on left and 1 on right. Medical A Surgical History Notes  nd Cardiovascular hypercholesterolemia Objective Constitutional respirations regular, non-labored and within target range for patient.. Vitals Time Taken: 9:13 AM, Temperature: 98 F, Pulse: 92 bpm, Respiratory Rate: 18 breaths/min, Blood Pressure: 155/85 mmHg, Capillary Blood Glucose: 159 mg/dl. Cardiovascular 2+ dorsalis pedis/posterior tibialis pulses. Psychiatric pleasant and cooperative. General Notes: Right foot: T the first submetatarsal there is an open wound with granulation tissue and nonviable tissue. No undermining noted. No signs of o surrounding infection. T the lateral aspect there is an open wound with devitalized tissue. Postdebridement there appears to be well-healing tissue. No signs of o surrounding infection. Integumentary (Hair, Skin) Wound #4 status is Open. Original cause of wound was Gradually Appeared. The date acquired was: 07/22/2021. The wound has been in  treatment 7 weeks. The wound is located on the Right,Plantar Metatarsal head first. The wound measures 0.1cm length x 0.1cm width x 0.1cm depth; 0.008cm^2 area and 0.001cm^3 volume. There is Fat Layer (Subcutaneous Tissue) exposed. There is no tunneling or undermining noted. There is a large amount of serosanguineous drainage noted. The wound margin is distinct with the outline attached to the wound base. There is large (67-100%) red, pink, friable granulation within the wound bed. There is no necrotic tissue within the wound bed. Wound #5 status is Open. Original cause of wound was Blister. The date acquired was: 11/22/2021. The wound is located on the Right,Lateral Foot. The wound measures 1.5cm length x 1.5cm width x 0.3cm depth; 1.767cm^2 area and 0.53cm^3 volume. There is Fat Layer (Subcutaneous Tissue) exposed. There is no tunneling or undermining noted. There is a medium amount of serosanguineous drainage noted. The wound margin is distinct with the outline attached to the wound base. There is large (67-100%) red, pink granulation within the wound bed. There is a small (1-33%) amount of necrotic tissue within the wound bed. Assessment Active Problems ICD-10 Non-pressure 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 Type 2 diabetes mellitus with foot ulcer Type 2 diabetes mellitus with diabetic polyneuropathy Patient's wound appears well-healing to the right plantar foot. Unfortunately she has developed another wound due to the cast rubbing against the foot. She has been walking a lot more. I debrided all nonviable tissue. No signs of infection. Grafix #3 was placed in standard fashion and the remaining was used to the lateral foot wound. I recommended she aggressively offload the foot over the next week. The cast was reapplied today. Extra padding was placed to the lateral aspect. Follow-up in 1 week. Procedures Wound #4 Pre-procedure diagnosis of Wound #4 is a Diabetic  Wound/Ulcer of the Lower Extremity located on the Right,Plantar Metatarsal head first .Severity of Tissue Pre Debridement is: Fat layer exposed. There was a Excisional Skin/Subcutaneous Tissue Debridement with a total area of 0.25 sq cm performed by Kalman Shan, DO. With the following instrument(s): Curette to remove Viable and Non-Viable tissue/material. Material removed includes Callus, Subcutaneous Tissue, and Slough. No specimens were taken. A time out was conducted at 09:45, prior to the start of the procedure. A Minimum amount of bleeding was controlled with Pressure. The procedure was tolerated well with a pain level of 0 throughout and a pain level of 0 following the procedure. Post Debridement Measurements: 0.2cm length x 0.3cm width x 0.2cm depth; 0.009cm^3 volume. Character of Wound/Ulcer Post Debridement is improved. Severity of Tissue Post Debridement is: Fat layer exposed. Post procedure Diagnosis Wound #4: Same as Pre-Procedure Pre-procedure diagnosis of Wound #4 is a Diabetic Wound/Ulcer of the Lower Extremity located on  the Right,Plantar Metatarsal head first. A skin graft procedure using a bioengineered skin substitute/cellular or tissue based product was performed by Kalman Shan, DO with the following instrument(s): Forceps and Scissors. Grafix prime was applied and secured with Steri-Strips. 6 sq cm of product was utilized and 0 sq cm was wasted. Post Application, ADAPTIC was applied. A Time Out was conducted at 10:00, prior to the start of the procedure. The procedure was tolerated well with a pain level of 0 throughout and a pain level of 0 following the procedure. Post procedure Diagnosis Wound #4: Same as Pre-Procedure . Pre-procedure diagnosis of Wound #4 is a Diabetic Wound/Ulcer of the Lower Extremity located on the Right,Plantar Metatarsal head first . There was a Total Contact Cast Procedure by Kalman Shan, DO. Post procedure Diagnosis Wound #4: Same as  Pre-Procedure Wound #5 Pre-procedure diagnosis of Wound #5 is a Diabetic Wound/Ulcer of the Lower Extremity located on the Right,Lateral Foot .Severity of Tissue Pre Debridement is: Fat layer exposed. There was a Selective/Open Wound Skin/Epidermis Debridement with a total area of 2.25 sq cm performed by Kalman Shan, DO. With the following instrument(s): Curette to remove Viable and Non-Viable tissue/material. Material removed includes Skin: Dermis and Skin: Epidermis and. No specimens were taken. A time out was conducted at 09:45, prior to the start of the procedure. A Minimum amount of bleeding was controlled with Pressure. The procedure was tolerated well with a pain level of 0 throughout and a pain level of 0 following the procedure. Post Debridement Measurements: 1.5cm length x 1.5cm width x 0.3cm depth; 0.53cm^3 volume. Character of Wound/Ulcer Post Debridement is improved. Severity of Tissue Post Debridement is: Fat layer exposed. Post procedure Diagnosis Wound #5: Same as Pre-Procedure Pre-procedure diagnosis of Wound #5 is a Diabetic Wound/Ulcer of the Lower Extremity located on the Right,Lateral Foot . There was a T Programmer, multimedia otal Procedure by Kalman Shan, DO. Post procedure Diagnosis Wound #5: Same as Pre-Procedure Plan Follow-up Appointments: Return Appointment in 1 week. - ***EXTRA TIME for skin sub application and a TCC*** Monday w/ Dr. Heber Bohemia and Allayne Butcher, Room 9 Cellular or Tissue Based Products: Wound #4 Right,Plantar Metatarsal head first: Cellular or Tissue Based Product Type: - GRAFIX 16MM # 1 APPLIED 09//18//23 Grafix 2 x 3 # 1 APPLIED 11/15/21 Grafix 2 x 3 # 2 APPLIED 11/22/21 Bathing/ Shower/ Hygiene: May shower with protection but do not get wound dressing(s) wet. Edema Control - Lymphedema / SCD / Other: Elevate legs to the level of the heart or above for 30 minutes daily and/or when sitting, a frequency of: Avoid standing for long periods of  time. Off-Loading: T Contact Cast to Right Lower Extremity otal WOUND #4: - Metatarsal head first Wound Laterality: Plantar, Right Cleanser: Soap and Water 1 x Per Week/7 Days Discharge Instructions: May shower and wash wound with dial antibacterial soap and water prior to dressing change. Cleanser: Wound Cleanser (Generic) 1 x Per Week/7 Days Discharge Instructions: Cleanse the wound with wound cleanser prior to applying a clean dressing using gauze sponges, not tissue or cotton balls. Prim Dressing: Maxorb Extra Calcium Alginate 2x2 in 1 x Per Week/7 Days ary Discharge Instructions: Apply calcium alginate over sorbact. Prim Dressing: Grafix skin sub 1 x Per Week/7 Days ary Secondary Dressing: Optifoam Non-Adhesive Dressing, 4x4 in (Generic) 1 x Per Week/7 Days Discharge Instructions: Apply a foam donut one layer over primary dressing as directed. Secondary Dressing: Woven Gauze Sponge, Non-Sterile 4x4 in (Generic) 1 x Per Week/7 Days Discharge  Instructions: Apply over primary dressing as directed. Secured With: [employer], 4.5x3.1 (in/yd) (Generic) 1 x Per Week/7 Days Discharge Instructions: Secure with Kerlix as directed. Secured With: 49M Medipore H Soft Cloth Surgical T ape, 4 x 10 (in/yd) (Generic) 1 x Per Week/7 Days Discharge Instructions: Secure with tape as directed. WOUND #5: - Foot Wound Laterality: Right, Lateral Cleanser: Soap and Water 1 x Per Week/7 Days Discharge Instructions: May shower and wash wound with dial antibacterial soap and water prior to dressing change. Topical: Gentamicin 1 x Per Week/7 Days Discharge Instructions: As directed by physician Topical: Mupirocin Ointment 1 x Per Week/7 Days Discharge Instructions: Apply Mupirocin (Bactroban) as instructed Prim Dressing: Hydrofera Blue Ready Foam, 2.5 x2.5 in 1 x Per Week/7 Days ary Discharge Instructions: Apply to wound bed as instructed Secondary Dressing: Optifoam Non-Adhesive Dressing, 4x4 in 1 x  Per Week/7 Days Discharge Instructions: Apply over primary dressing as directed. Secondary Dressing: Woven Gauze Sponge, Non-Sterile 4x4 in 1 x Per Week/7 Days Discharge Instructions: Apply over primary dressing as directed. Secured With: 49M Medipore H Soft Cloth Surgical T ape, 4 x 10 (in/yd) 1 x Per Week/7 Days Discharge Instructions: Secure with tape as directed. 1. In office sharp debridement 2. Grafix #3 placed in standard fashion 3. Aggressive offloadingooT contact cast placed in standard fashion otal 4. Follow-up in 1 week Electronic Signature(s) Signed: 11/22/2021 1:44:59 PM By: Kalman Shan DO Entered By: Kalman Shan on 11/22/2021 11:26:40 -------------------------------------------------------------------------------- HxROS Details Patient Name: Date of Service: [name], [name] 11/22/2021 9:15 A M Medical Record Number: [id number] Patient Account Number: [id number] Date of Birth/Sex: Treating RN: [birth year] (66 y.o. F) Primary Care Provider: [name] Other Clinician: Referring Provider: Treating Provider/Extender: [name] in Treatment: 7 Information Obtained From Patient Chart Eyes Medical History: Negative for: Cataracts; Glaucoma; Optic Neuritis Ear/Nose/Mouth/Throat Medical History: Negative for: Chronic sinus problems/congestion; Middle ear problems Hematologic/Lymphatic Medical History: Negative for: Anemia; Hemophilia; Human Immunodeficiency Virus; Lymphedema; Sickle Cell Disease Respiratory Medical History: Positive for: Sleep Apnea Negative for: Aspiration; Asthma; Chronic Obstructive Pulmonary Disease (COPD); Pneumothorax; Tuberculosis Cardiovascular Medical History: Positive for: Coronary Artery Disease; Hypertension Negative for: Angina; Arrhythmia; Congestive Heart Failure; Deep Vein Thrombosis; Hypotension; Myocardial Infarction; Peripheral Arterial Disease; Peripheral Venous Disease;  Phlebitis; Vasculitis Past Medical History Notes: hypercholesterolemia Gastrointestinal Medical History: Negative for: Cirrhosis ; Colitis; Crohns; Hepatitis A; Hepatitis B; Hepatitis C Endocrine Medical History: Positive for: Type II Diabetes Negative for: Type I Diabetes Genitourinary Medical History: Negative for: End Stage Renal Disease Immunological Medical History: Negative for: Lupus Erythematosus; Raynauds; Scleroderma Integumentary (Skin) Medical History: Negative for: History of Burn Musculoskeletal Medical History: Negative for: Gout; Rheumatoid Arthritis; Osteoarthritis; Osteomyelitis Neurologic Medical History: Positive for: Neuropathy Negative for: Dementia; Quadriplegia; Paraplegia; Seizure Disorder Immunizations Pneumococcal Vaccine: Received Pneumococcal Vaccination: Yes Received Pneumococcal Vaccination On or After 60th Birthday: Yes Tetanus Vaccine: Last tetanus shot: 12/17/2020 Implantable Devices No devices added Hospitalization / Surgery History Type of Hospitalization/Surgery 2nd and 3rd right toe amps total hysterectomy cholecystectomy 2 knee surgery's on left and 1 on right Family and Social History Unknown History: Yes; Current every day smoker - 1 pack/a/day; Alcohol Use: Never; Drug Use: No History; Caffeine Use: Moderate; Financial Concerns: No; Food, Clothing or Shelter Needs: No; Support System Lacking: No; Transportation Concerns: No Electronic Signature(s) Signed: 11/22/2021 1:44:59 PM By: Kalman Shan DO Entered By: Kalman Shan on 11/22/2021 11:23:09 -------------------------------------------------------------------------------- Total Contact Cast Details Patient Name: Date of Service: [name],  [name] 11/22/2021 9:15 A M Medical Record Number: [id number] Patient Account Number: [id number] Date of Birth/Sex: Treating RN: 08/03/1955 (66 y.o. [name], [name] Primary Care Provider: [name] Other  Clinician: Referring Provider: Treating Provider/Extender: [name] in Treatment: 7 T Contact Cast Applied for Wound Assessment: otal Wound #4 Right,Plantar Metatarsal head first Performed By: Physician Kalman Shan, DO Post Procedure Diagnosis Same as Pre-procedure Electronic Signature(s) Signed: 11/22/2021 1:44:59 PM By: Kalman Shan DO Signed: 11/22/2021 4:12:00 PM By: Rhae Hammock RN Entered By: Rhae Hammock on 11/22/2021 10:24:58 -------------------------------------------------------------------------------- Total Contact Cast Details Patient Name: Date of Service: [name], [name] 11/22/2021 9:15 A M Medical Record Number: [id number] Patient Account Number: [id number] Date of Birth/Sex: Treating RN: 29-Sep-1955 (66 y.o. [name], [name] Primary Care Provider: [name] Other Clinician: Referring Provider: Treating Provider/Extender: [name] in Treatment: 7 T Contact Cast Applied for Wound Assessment: otal Wound #5 Right,Lateral Foot Performed By: Physician Kalman Shan, DO Post Procedure Diagnosis Same as Pre-procedure Electronic Signature(s) Signed: 11/22/2021 1:44:59 PM By: Kalman Shan DO Signed: 11/22/2021 4:12:00 PM By: Rhae Hammock RN Entered By: Rhae Hammock on 11/22/2021 10:24:58 -------------------------------------------------------------------------------- [place] Details Patient Name: Date of Service: [place], [name] 11/22/2021 Medical Record Number: [id number] Patient Account Number: [id number] Date of Birth/Sex: Treating RN: 11-03-1955 (66 y.o. [name], [name] Primary Care Provider: [name] Other Clinician: Referring Provider: Treating Provider/Extender: [name] in Treatment: 7 Diagnosis Coding ICD-10 Codes Code Description [phone] Non-pressure chronic ulcer of other  part of right foot with fat layer exposed E11.621 Type 2 diabetes mellitus with foot ulcer E11.42 Type 2 diabetes mellitus with diabetic polyneuropathy Facility Procedures CPT4 Code: 58099833 Description: (Facility Use Only) Grafix Prime 2x3 Modifier: Quantity: 6 CPT4 Code: [id number] Description: 67341 - SKIN SUB GRAFT FACE/NK/HF/G ICD-10 Diagnosis Description L97.512 Non-pressure 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 E11.621 Type 2 diabetes mellitus with foot ulcer Modifier: Quantity: 1 CPT4 Code: [id number] Description: 97353 - DEBRIDE WOUND 1ST 20 SQ CM OR < ICD-10 Diagnosis Description L97.512 Non-pressure 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 Modifier: Quantity: 1 Physician Procedures : CPT4 Code Description Modifier [id number] - WC PHYS LEVEL 3 - EST PT ICD-10 Diagnosis Description L97.512 Non-pressure 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 E11.621 Type 2 diabetes mellitus with foot ulcer E11.42 Type 2  diabetes mellitus with diabetic polyneuropathy Quantity: 1 : [id number] 89211 - WC PHYS SKIN SUB GRAFT FACE/NK/HF/G ICD-10 Diagnosis Description L97.512 Non-pressure 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 E11.621 Type 2 diabetes mellitus with foot ulcer Quantity: 1 : [id number] 14481 - WC PHYS DEBR WO ANESTH 20 SQ CM ICD-10 Diagnosis Description L97.512 Non-pressure 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 Quantity: 1 Electronic Signature(s) Signed: 11/22/2021 1:44:59 PM By: Kalman Shan DO Entered By: Kalman Shan on 11/22/2021 11:27:04

## 2021-11-23 ENCOUNTER — Encounter (HOSPITAL_COMMUNITY): Payer: Self-pay | Admitting: Licensed Clinical Social Worker

## 2021-11-24 ENCOUNTER — Ambulatory Visit (INDEPENDENT_AMBULATORY_CARE_PROVIDER_SITE_OTHER): Payer: Medicare Other | Admitting: Licensed Clinical Social Worker

## 2021-11-24 DIAGNOSIS — F331 Major depressive disorder, recurrent, moderate: Secondary | ICD-10-CM | POA: Diagnosis not present

## 2021-11-24 NOTE — Plan of Care (Signed)
  Problem: Depression Goal:  Decrease depressive symptoms and improve levels of effective functioning-pt reports a decrease in overall depression symptoms 3 out of 5 sessions documented.  Outcome: Progressing Goal: Develop healthy thinking patterns and beliefs about self, others, and the world that lead to the alleviation and help prevent the relapse of depression per self report 3 out of 5 sessions documented.   Outcome: Progressing   

## 2021-11-24 NOTE — Progress Notes (Signed)
  THERAPIST PROGRESS NOTE  Session Time: 1015-11a  [place] in office visit for patient and LCSW clinician  Participation Level: Active  Behavioral Response: NAAlertDepressed  Type of Therapy: Individual Therapy  Treatment Goals addressed: Problem: Depression Goal:  Decrease depressive symptoms and improve levels of effective functioning-pt reports a decrease in overall depression symptoms 3 out of 5 sessions documented.  Outcome: Progressing Goal: Develop healthy thinking patterns and beliefs about self, others, and the world that lead to the alleviation and help prevent the relapse of depression per self report 3 out of 5 sessions documented.   Outcome: Progressing  ProgressTowards Goals: Progressing  Interventions: CBT, Supportive, and Reframing  Summary: [name] is a 66 y.o. female who presents with improving symptoms associated with depression diagnosis.   Allowed pt to explore and express thoughts and feelings associated with recent life situations and external stressors.   Reviewed coping skills that help patient the most when she is feeling most depressed. Patient reflects understanding and cooperation.  Continued recommendations are as follows: self care behaviors, positive social engagements, focusing on overall work/home/life balance, and focusing on positive physical and emotional wellness.   Suicidal/Homicidal: No  Therapist Response: The patient continues to make good progress with self-understanding and self-insight. There is continued development of the patient's capacity for self-care and life management. Developments continue in the areas of family and relational functioning.   Plan: Return again in 8 weeks.The ongoing treatment plan includes therapeutic work on building and maintaining self-care; and addressing problematic coping strategies and mechanisms. Treatment continues to show good evolution and development.  Treatment to continue as indicated.  Diagnosis:  Encounter Diagnosis  Name Primary?   MDD (major depressive disorder), recurrent episode, moderate ([place]) Yes    Collaboration of Care: Other pt to continue follow ups with PCP Dr. F[facility]  Patient/Guardian was advised Release of Information must be obtained prior to any record release in order to collaborate their care with an outside provider. Patient/Guardian was advised if they have not already done so to contact the registration department to sign all necessary forms in order for [place] to release information regarding their care.   Consent: Patient/Guardian gives verbal consent for treatment and assignment of benefits for services provided during this visit. Patient/Guardian expressed understanding and agreed to proceed.   [place], LCSW 11/24/2021

## 2021-11-29 ENCOUNTER — Encounter (HOSPITAL_BASED_OUTPATIENT_CLINIC_OR_DEPARTMENT_OTHER): Payer: Medicare Other | Admitting: Internal Medicine

## 2021-11-29 DIAGNOSIS — E11621 Type 2 diabetes mellitus with foot ulcer: Secondary | ICD-10-CM

## 2021-11-29 DIAGNOSIS — E1142 Type 2 diabetes mellitus with diabetic polyneuropathy: Secondary | ICD-10-CM

## 2021-11-29 DIAGNOSIS — L97512 Non-pressure 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: Secondary | ICD-10-CM

## 2021-11-29 DIAGNOSIS — F17210 Nicotine dependence, cigarettes, uncomplicated: Secondary | ICD-10-CM | POA: Diagnosis not present

## 2021-11-29 NOTE — Progress Notes (Signed)
TRENITY, PHA ([id number]) [id number] Page 1 of 6 Visit Report for 10/28/2021 Arrival Information Details Patient Name: Date of Service: [name], [name] 10/28/2021 10:45 A M Medical Record Number: [id number] Patient Account Number: [id number] Date of Birth/Sex: Treating RN: 04/11/1955 (66 y.o. [name], [name] Primary Care Nivan Melendrez: [name] Other Clinician: Referring Senie Lanese: Treating Lana Flaim/Extender: [name] in Treatment: 3 Visit Information History Since Last Visit Added or deleted any medications: No Patient Arrived: Ambulatory Any new allergies or adverse reactions: No Arrival Time: 11:18 Had a fall or experienced change in No Accompanied By: Self activities of daily living that may affect Transfer Assistance: None risk of falls: Patient Identification Verified: Yes Signs or symptoms of abuse/neglect since last visito No Secondary Verification Process Completed: Yes Hospitalized since last visit: No Patient Requires Transmission-Based Precautions: No Implantable device outside of the clinic excluding No Patient Has Alerts: No cellular tissue based products placed in the center since last visit: Has Dressing in Place as Prescribed: Yes Has Footwear/Offloading in Place as Prescribed: Yes Right: T Contact Cast otal Pain Present Now: No Electronic Signature(s) Signed: 10/28/2021 4:40:53 PM By: Deon Pilling RN, BSN Entered By: Deon Pilling on 10/28/2021 11:18:29 -------------------------------------------------------------------------------- Encounter Discharge Information Details Patient Name: Date of Service: [name], [name] 10/28/2021 10:45 A M Medical Record Number: [id number] Patient Account Number: [id number] Date of Birth/Sex: Treating RN: 1955-04-04 (66 y.o. [name], [name] Primary Care Latiana Tomei: [name] Other Clinician: Referring Lashica Hannay: Treating  Sondos Wolfman/Extender: [name] in Treatment: 3 Encounter Discharge Information Items Discharge Condition: Stable Ambulatory Status: Ambulatory Discharge Destination: Home Transportation: Private Auto Accompanied By: self Schedule Follow-up Appointment: Yes Clinical Summary of Care: Patient Declined Electronic Signature(s) Signed: 11/29/2021 11:35:58 AM By: Rhae Hammock RN Entered By: Rhae Hammock on 10/28/2021 14:03:30 Baggerly, Monigue K ([id number]) [id number] Page 2 of 6 -------------------------------------------------------------------------------- Lower Extremity Assessment Details Patient Name: Date of Service: [place] [name] 10/28/2021 10:45 A M Medical Record Number: [id number] Patient Account Number: [id number] Date of Birth/Sex: Treating RN: 1955/09/02 (66 y.o. [name], [name] Primary Care Shaquetta Arcos: [name] Other Clinician: Referring Hattye Siegfried: Treating Jonn Chaikin/Extender: [name] in Treatment: 3 Edema Assessment Assessed: [Left: No] [Right: Yes] Edema: [Left: Ye] [Right: s] Calf Left: Right: Point of Measurement: From Medial Instep 47 cm Ankle Left: Right: Point of Measurement: From Medial Instep 26.7 cm Vascular Assessment Pulses: Dorsalis Pedis Palpable: [Right:Yes] Posterior Tibial Palpable: [Right:Yes] Electronic Signature(s) Signed: 10/28/2021 4:40:53 PM By: Deon Pilling RN, BSN Entered By: Deon Pilling on 10/28/2021 11:17:22 -------------------------------------------------------------------------------- Multi Wound Chart Details Patient Name: Date of Service: [name], [name] 10/28/2021 10:45 A M Medical Record Number: [id number] Patient Account Number: [id number] Date of Birth/Sex: Treating RN: 01-28-1956 (66 y.o. F) Primary Care Jacek Colson: [name] Other Clinician: Referring Dyllen Menning: Treating Jung Ingerson/Extender:  [name] in Treatment: 3 Vital Signs Height(in): Pulse(bpm): 74 Weight(lbs): Blood Pressure(mmHg): 130/70 Body Mass Index(BMI): Temperature(F): 98.7 Respiratory Rate(breaths/min): 17 [4:Photos:] [N/A:N/A] CARIANA, KARGE ([id number]) [4:Right, Plantar Metatarsal head first Wound Location: Gradually Appeared Wounding Event: Diabetic Wound/Ulcer of the Lower Primary Etiology: Extremity Sleep Apnea, Coronary Artery Comorbid History: Disease, Hypertension, Type II Diabetes, Neuropathy  07/22/2021 Date Acquired: 3 Weeks of Treatment: Open Wound Status: No Wound Recurrence: Yes Pending A mputation on Presentation: 1x1x0.5 Measurements L x W x D (cm) 0.785 A (cm) : rea 0.393 Volume (cm) : -  1005.60% % Reduction in A rea: -151.90% %  Reduction in Volume: 12 Starting Position 1 (o'clock): 12 Ending Position 1 (o'clock): 0.5 Maximum Distance 1 (cm): Yes Undermining: Grade 2 Classification: Large Exudate A mount: Serosanguineous Exudate Type: red, brown Exudate Color: Distinct, outline  attached Wound Margin: Large (67-100%) Granulation A mount: Red, Pink Granulation Quality: Small (1-33%) Necrotic A mount: Fat Layer (Subcutaneous Tissue): Yes N/A Exposed Structures: Fascia: No Tendon: No Muscle: No Joint: No Bone: No None  Epithelialization:] [N/A:N/A N/A N/A N/A N/A N/A N/A N/A N/A N/A N/A N/A N/A N/A N/A N/A N/A N/A N/A N/A N/A N/A N/A N/A] Treatment Notes Electronic Signature(s) Signed: 10/28/2021 1:45:58 PM By: Kalman Shan DO Entered By: Kalman Shan on 10/28/2021 11:23:48 -------------------------------------------------------------------------------- Multi-Disciplinary Care Plan Details Patient Name: Date of Service: [name], [name] 10/28/2021 10:45 A M Medical Record Number: [id number] Patient Account Number: [id number] Date of Birth/Sex: Treating RN: 08-24-1955 (66 y.o. [name], [name] Primary Care Emerly Prak: [name] Other  Clinician: Referring Jeremias Broyhill: Treating Storey Stangeland/Extender: [name] in Treatment: 3 Active Inactive Wound/Skin Impairment Nursing Diagnoses: Impaired tissue integrity Knowledge deficit related to ulceration/compromised skin integrity Goals: Patient will have a decrease in wound volume by X% from date: (specify in notes) Date Initiated: 10/01/2021 Target Resolution Date: 11/20/2021 Goal Status: Active Patient/caregiver will verbalize understanding of skin care regimen Date Initiated: 10/01/2021 Target Resolution Date: 11/20/2021 Goal Status: Active AMITY, ROES ([id number]) [phone].pdf Page 4 of 6 Ulcer/skin breakdown will have a volume reduction of 30% by week 4 Date Initiated: 10/01/2021 Target Resolution Date: 11/20/2021 Goal Status: Active Interventions: Assess patient/caregiver ability to obtain necessary supplies Assess patient/caregiver ability to perform ulcer/skin care regimen upon admission and as needed Assess ulceration(s) every visit Notes: Electronic Signature(s) Signed: 11/29/2021 11:35:58 AM By: Rhae Hammock RN Entered By: Rhae Hammock on 10/28/2021 14:00:52 -------------------------------------------------------------------------------- Pain Assessment Details Patient Name: Date of Service: [name], [name] 10/28/2021 10:45 A M Medical Record Number: [id number] Patient Account Number: [id number] Date of Birth/Sex: Treating RN: 12/25/1955 (66 y.o. [name], [name] Primary Care Val Schiavo: [name] Other Clinician: Referring Kylor Valverde: Treating Agata Lucente/Extender: [name] in Treatment: 3 Active Problems Location of Pain Severity and Description of Pain Patient Has Paino No Site Locations Pain Management and Medication Current Pain Management: Electronic Signature(s) Signed: 10/28/2021 4:40:53 PM By: Deon Pilling RN, BSN Entered By: Deon Pilling on 10/28/2021 11:17:31 -------------------------------------------------------------------------------- Patient/Caregiver Education Details Patient Name: Date of Service: [name], [place]. 9/7/2023andnbsp10:45 Ronnell Freshwater, Lowana Raliegh Ip ([id number]) [id number] Page 5 of 6 Medical Record Number: [id number] Patient Account Number: [id number] Date of Birth/Gender: Treating RN: Mar 21, 1955 (66 y.o. [name], [name] Primary Care Physician: [name] Other Clinician: Referring Physician: Treating Physician/Extender: [name] in Treatment: 3 Education Assessment Education Provided To: Patient Education Topics Provided Wound/Skin Impairment: Methods: Explain/Verbal Responses: Reinforcements needed, State content correctly Electronic Signature(s) Signed: 11/29/2021 11:35:58 AM By: Rhae Hammock RN Entered By: Rhae Hammock on 10/28/2021 14:01:05 -------------------------------------------------------------------------------- Wound Assessment Details Patient Name: Date of Service: [name], [name] 10/28/2021 10:45 A M Medical Record Number: [id number] Patient Account Number: [id number] Date of Birth/Sex: Treating RN: 1955/08/14 (66 y.o. [name] Primary Care Dannica Bickham: [name] Other Clinician: Referring Kimmy Totten: Treating Davison Ohms/Extender: [name] in Treatment: 3 Wound Status Wound Number: 4 Primary Diabetic Wound/Ulcer of the Lower Extremity Etiology: Wound Location:  Right, Plantar Metatarsal head first Wound Open Wounding Event: Gradually Appeared Status: Date Acquired: 07/22/2021 Comorbid Sleep Apnea, Coronary Artery Disease, Hypertension, Type II Weeks Of Treatment: 3 History: Diabetes, Neuropathy Clustered Wound: No Pending Amputation On Presentation Photos Wound Measurements Length: (cm) 1 Width: (cm) 1 Depth: (cm) 0.5 Area:  (cm) 0.785 Volume: (cm) 0.393 Hegna, Pollie K ([id number]) Wound Description Classification: Grade 2 Wound Margin: Distinct, outline attached Exudate Amount: Large Exudate Type: Serosanguineous Exudate Color: red, brown Foul Odor After Cleansing: No Slough/Fibrino Yes % Reduction in Area: -1005.6% % Reduction in Volume: -151.9% Epithelialization: None Tunneling: No Undermining: Yes Starting Position (o'clock): 12 Ending Position (o'clock): 12 Maximum Distance: (cm) 0.5 [id number] Page 6 of 6 Wound Bed Granulation Amount: Large (67-100%) Exposed Structure Granulation Quality: Red, Pink Fascia Exposed: No Necrotic Amount: Small (1-33%) Fat Layer (Subcutaneous Tissue) Exposed: Yes Necrotic Quality: Adherent Slough Tendon Exposed: No Muscle Exposed: No Joint Exposed: No Bone Exposed: No Electronic Signature(s) Signed: 10/28/2021 4:40:53 PM By: Deon Pilling RN, BSN Entered By: Deon Pilling on 10/28/2021 11:17:09 -------------------------------------------------------------------------------- Vitals Details Patient Name: Date of Service: [name], [name] 10/28/2021 10:45 A M Medical Record Number: [id number] Patient Account Number: [id number] Date of Birth/Sex: Treating RN: [birth year] (66 y.o. [name] Primary Care Maly Lemarr: [name] Other Clinician: Referring Yasamin Karel: Treating Eliyana Pagliaro/Extender: [name] in Treatment: 3 Vital Signs Time Taken: 11:00 Temperature (F): 98.7 Pulse (bpm): 74 Respiratory Rate (breaths/min): 17 Blood Pressure (mmHg): 130/70 Reference Range: 80 - 120 mg / dl Electronic Signature(s) Signed: 10/28/2021 4:40:53 PM By: Deon Pilling RN, BSN Entered By: Deon Pilling on 10/28/2021 11:18:04

## 2021-12-02 NOTE — Progress Notes (Signed)
[name], [name] ([id number]) [id number] Page 1 of 8 Visit Report for 11/29/2021 Arrival Information Details Patient Name: Date of Service: [place] [name], [name] 11/29/2021 11:30 [name] M Medical Record Number: [id number] Patient Account Number: [id number] Date of Birth/Sex: Treating RN: 1955/08/25 (66 y.o. [name], [name] Primary Care [name]: [name] Other Clinician: Referring [name]: Treating [name]/Extender: [name] in Treatment: 8 Visit Information History Since Last Visit Added or deleted any medications: No Patient Arrived: Ambulatory Any new allergies or adverse reactions: No Arrival Time: 11:27 Had [name] fall or experienced change in No Accompanied By: self activities of daily living that may affect Transfer Assistance: None risk of falls: Patient Requires Transmission-Based Precautions: No Signs or symptoms of abuse/neglect since last No Patient Has Alerts: No visito Hospitalized since last visit: No Implantable device outside of the clinic No excluding cellular tissue based products placed in the center since last visit: Has Dressing in Place as Prescribed: Yes Has Footwear/Offloading in Place as Yes Prescribed: Left: Removable Cast Walker/Walking Boot T Contact Cast otal Pain Present Now: No Electronic Signature(s) Signed: 11/30/2021 8:52:44 AM By: [name] Entered By: [name] on 11/29/2021 11:29:30 -------------------------------------------------------------------------------- Encounter Discharge Information Details Patient Name: Date of Service: [name], [name] 11/29/2021 11:30 [name] M Medical Record Number: [id number] Patient Account Number: [id number] Date of Birth/Sex: Treating RN: 1955-10-05 (66 y.o. [name], [name] Primary Care Doha Boling: [name] Other Clinician: Referring Cozy Veale: Treating [name]/Extender: [name] in Treatment: 8 Encounter Discharge Information Items Post Procedure Vitals Discharge Condition: Stable Temperature (F): 98.7 Ambulatory Status: Ambulatory Pulse (bpm): 74 Discharge Destination: Home Respiratory Rate (breaths/min): 17 Transportation: Private Auto Blood Pressure (mmHg): 120/80 Accompanied By: self Schedule Follow-up Appointment: Yes Clinical Summary of Care: Patient Declined Electronic Signature(s) Signed: 12/02/2021 4:29:25 PM By: [name] Hammock RN Entered By: [name] on 11/29/2021 12:39:04 [name], [name] ([id number]) [id number] Page 2 of 8 -------------------------------------------------------------------------------- Lower Extremity Assessment Details Patient Name: Date of Service: [name] 11/29/2021 11:30 [name] M Medical Record Number: [id number] Patient Account Number: [id number] Date of Birth/Sex: Treating RN: 1955-02-23 (66 y.o. [name], [name] Primary Care Nikie Cid: [name] Other Clinician: Referring [name]: Treating [name]/Extender: [name] in Treatment: 8 Edema Assessment Assessed: [Left: No] [Right: No] Edema: [Left: Ye] [Right: s] Calf Left: Right: Point of Measurement: From Medial Instep 44.8 cm Ankle Left: Right: Point of Measurement: From Medial Instep 25 cm Electronic Signature(s) Signed: 11/30/2021 8:52:44 AM By: [name] Signed: 12/02/2021 4:29:25 PM By: [name] Hammock RN Entered By: [name] on 11/29/2021 11:50:01 -------------------------------------------------------------------------------- Multi Wound Chart Details Patient Name: Date of Service: [name], [name] 11/29/2021 11:30 [name] M Medical Record Number: [id number] Patient Account Number: [id number] Date of Birth/Sex: Treating RN: 01/01/1956 (66 y.o. [name], [name] Primary Care [name]: [name] Other  Clinician: Referring [name]: Treating [name]/Extender: [name] in Treatment: 8 Vital Signs Height(in): Capillary Blood Glucose(mg/dl): 143 Weight(lbs): Pulse(bpm): 80 Body Mass Index(BMI): Blood Pressure(mmHg): 152/65 Temperature(F): 98 Respiratory Rate(breaths/min): 18 [4:Photos:] [N/[name]:N/[name]] Right, Plantar Metatarsal head first Right, Lateral Foot N/[name] Wound Location: Gradually Appeared Blister N/[name] Wounding Event: Diabetic Wound/Ulcer of the Lower Diabetic Wound/Ulcer of the Lower N/[name] Primary Etiology: Extremity Extremity [name], [name] ([id number]) [id number] Page 3 of 8 Sleep Apnea, Coronary Artery Sleep Apnea, Coronary Artery N/[name] Comorbid History: Disease, Hypertension, Type II Disease, Hypertension, Type II  Diabetes, Neuropathy Diabetes, Neuropathy 07/22/2021 11/22/2021 N/[name] Date Acquired: 8 1 N/[name] Weeks of Treatment: Open Open N/[name] Wound Status: No No N/[name] Wound Recurrence: Yes No N/[name] Pending [name] mputation on Presentation: 0.1x0.1x0.1 1.2x1.4x0.3 N/[name] Measurements L x W x D (cm) 0.008 1.319 N/[name] [name] (cm) : rea 0.001 0.396 N/[name] Volume (cm) : 88.70% 25.40% N/[name] % Reduction in [name] rea: 99.40% 25.30% N/[name] % Reduction in Volume: Grade 2 Grade 2 N/[name] Classification: Large Medium N/[name] Exudate [name] mount: Serosanguineous Serosanguineous N/[name] Exudate Type: red, brown red, brown N/[name] Exudate Color: Distinct, outline attached Distinct, outline attached N/[name] Wound Margin: Large (67-100%) Small (1-33%) N/[name] Granulation [name] mount: Red, Pink Red, Pink N/[name] Granulation Quality: None Present (0%) Large (67-100%) N/[name] Necrotic [name] mount: Fat Layer (Subcutaneous Tissue): Yes Fat Layer (Subcutaneous Tissue): Yes N/[name] Exposed Structures: Fascia: No Fascia: No Tendon: No Tendon: No Muscle: No Muscle: No Joint: No Joint: No Bone: No Bone: No Large (67-100%) None N/[name] Epithelialization: Callus: Yes Callus: Yes  N/[name] Periwound Skin Texture: Excoriation: No Excoriation: No Induration: No Induration: No Crepitus: No Crepitus: No Rash: No Rash: No Scarring: No Scarring: No Maceration: No Maceration: No N/[name] Periwound Skin Moisture: Dry/Scaly: No Dry/Scaly: No Atrophie Blanche: No Atrophie Blanche: No N/[name] Periwound Skin Color: Cyanosis: No Cyanosis: No Ecchymosis: No Ecchymosis: No Erythema: No Erythema: No Hemosiderin Staining: No Hemosiderin Staining: No Mottled: No Mottled: No Pallor: No Pallor: No Rubor: No Rubor: No No Abnormality No Abnormality N/[name] Temperature: Treatment Notes Electronic Signature(s) Signed: 11/29/2021 12:26:27 PM By: Kalman Shan DO Signed: 12/02/2021 4:29:25 PM By: [name] Hammock RN Entered By: Kalman Shan on 11/29/2021 12:17:18 -------------------------------------------------------------------------------- Multi-Disciplinary Care Plan Details Patient Name: Date of Service: [name], [name] 11/29/2021 11:30 [name] M Medical Record Number: [id number] Patient Account Number: [id number] Date of Birth/Sex: Treating RN: 02-24-1955 (66 y.o. [name], [name] Primary Care Jeffrie Lofstrom: [name] Other Clinician: Referring Nicola Heinemann: Treating Emari Demmer/Extender: [name] in Treatment: 8 Active Inactive Wound/Skin Impairment Nursing Diagnoses: Impaired tissue integrity Knowledge deficit related to ulceration/compromised skin integrity [employer], PRUDEN ([id number]) [phone].pdf Page 4 of 8 Goals: Patient will have [name] decrease in wound volume by X% from date: (specify in notes) Date Initiated: 10/01/2021 Target Resolution Date: 12/18/2021 Goal Status: Active Patient/caregiver will verbalize understanding of skin care regimen Date Initiated: 10/01/2021 Target Resolution Date: 12/18/2021 Goal Status: Active Ulcer/skin breakdown will have [name] volume reduction of 30% by week  4 Date Initiated: 10/01/2021 Target Resolution Date: 12/18/2021 Goal Status: Active Interventions: Assess patient/caregiver ability to obtain necessary supplies Assess patient/caregiver ability to perform ulcer/skin care regimen upon admission and as needed Assess ulceration(s) every visit Notes: Electronic Signature(s) Signed: 12/02/2021 4:29:25 PM By: [name] Hammock RN Entered By: [name] on 11/29/2021 12:38:05 -------------------------------------------------------------------------------- Pain Assessment Details Patient Name: Date of Service: [name], [name] 11/29/2021 11:30 [name] M Medical Record Number: [id number] Patient Account Number: [id number] Date of Birth/Sex: Treating RN: 1955/11/04 (66 y.o. [name], [name] Primary Care Keiyana Stehr: [name] Other Clinician: Referring Veleka Djordjevic: Treating Aloysious Vangieson/Extender: [name] in Treatment: 8 Active Problems Location of Pain Severity and Description of Pain Patient Has Paino No Site Locations Pain Management and Medication Current Pain Management: Electronic Signature(s) Signed: 11/30/2021 8:52:44 AM By: [name] Signed: 12/02/2021 4:29:25 PM By: [name] Hammock RN Entered By: [name] on 11/29/2021 11:30:23 [name], [name] ([id number]) [id number] Page 5 of 8 -------------------------------------------------------------------------------- Patient/Caregiver Education Details Patient Name: Date of Service: [place]  [name], [name] 10/9/2023andnbsp11:30 [name] M Medical Record Number: [id number] Patient Account Number: [id number] Date of Birth/Gender: Treating RN: Jan 16, 1956 (66 y.o. [name], [name] Primary Care Physician: [name] Other Clinician: Referring Physician: Treating Physician/Extender: [name] in Treatment: 8 Education Assessment Education Provided  To: Patient Education Topics Provided Wound/Skin Impairment: Methods: Explain/Verbal Responses: Reinforcements needed, State content correctly Electronic Signature(s) Signed: 12/02/2021 4:29:25 PM By: [name] Hammock RN Entered By: [name] on 11/29/2021 12:38:18 -------------------------------------------------------------------------------- Wound Assessment Details Patient Name: Date of Service: [name], [name] 11/29/2021 11:30 [name] M Medical Record Number: [id number] Patient Account Number: [id number] Date of Birth/Sex: Treating RN: [birth year] (66 y.o. [name], [name] Primary Care Marvelyn Bouchillon: [name] Other Clinician: Referring Camry Robello: Treating Kalen Ratajczak/Extender: [name] in Treatment: 8 Wound Status Wound Number: 4 Primary Diabetic Wound/Ulcer of the Lower Extremity Etiology: Wound Location: Right, Plantar Metatarsal head first Wound Healed - Epithelialized Wounding Event: Gradually Appeared Status: Date Acquired: 07/22/2021 Comorbid Sleep Apnea, Coronary Artery Disease, Hypertension, Type II Weeks Of Treatment: 8 History: Diabetes, Neuropathy Clustered Wound: No Pending Amputation On Presentation Photos Wound Measurements Length: (cm) 0 Stay, Elya [name] ([id number]) Width: (cm) 0 Depth: (cm) 0 Area: (cm) 0 Volume: (cm) 0 % Reduction in Area: 100% [id number] Page 6 of 8 % Reduction in Volume: 100% Epithelialization: Large (67-100%) Tunneling: No Undermining: No Wound Description Classification: Grade 2 Wound Margin: Distinct, outline attached Exudate Amount: Large Exudate Type: Serosanguineous Exudate Color: red, brown Foul Odor After Cleansing: No Slough/Fibrino No Wound Bed Granulation Amount: Large (67-100%) Exposed Structure Granulation Quality: Red, Pink Fascia Exposed: No Necrotic Amount: None Present (0%) Fat Layer (Subcutaneous Tissue) Exposed:  Yes Tendon Exposed: No Muscle Exposed: No Joint Exposed: No Bone Exposed: No Periwound Skin Texture Texture Color No Abnormalities Noted: No No Abnormalities Noted: Yes Callus: Yes Temperature / Pain Crepitus: No Temperature: No Abnormality Excoriation: No Induration: No Rash: No Scarring: No Moisture No Abnormalities Noted: Yes Treatment Notes Wound #4 (Metatarsal head first) Wound Laterality: Plantar, Right Cleanser Peri-Wound Care Topical Primary Dressing Secondary Dressing Secured With Compression Wrap Compression Stockings Add-Ons Electronic Signature(s) Signed: 12/02/2021 4:29:25 PM By: [name] Hammock RN Entered By: [name] on 11/29/2021 12:21:32 -------------------------------------------------------------------------------- Wound Assessment Details Patient Name: Date of Service: [name], [name] 11/29/2021 11:30 [name] M Medical Record Number: [id number] Patient Account Number: [id number] Date of Birth/Sex: Treating RN: 06-11-1955 (66 y.o. [name], [name] Primary Care Lorann Tani: [name] Other Clinician: Referring Paityn Balsam: Treating Thersia Petraglia/Extender: [name] in Treatment: 8 Wound Status [name], [name] ([id number]) [id number] Page 7 of 8 Wound Number: 5 Primary Diabetic Wound/Ulcer of the Lower Extremity Etiology: Wound Location: Right, Lateral Foot Wound Open Wounding Event: Blister Status: Date Acquired: 11/22/2021 Comorbid Sleep Apnea, Coronary Artery Disease, Hypertension, Type II Weeks Of Treatment: 1 History: Diabetes, Neuropathy Clustered Wound: No Photos Wound Measurements Length: (cm) 1.2 Width: (cm) 1.4 Depth: (cm) 0.3 Area: (cm) 1.319 Volume: (cm) 0.396 % Reduction in Area: 25.4% % Reduction in Volume: 25.3% Epithelialization: None Tunneling: No Undermining: No Wound Description Classification: Grade 2 Wound Margin: Distinct, outline  attached Exudate Amount: Medium Exudate Type: Serosanguineous Exudate Color: red, brown Foul Odor After Cleansing: No Slough/Fibrino Yes Wound Bed Granulation Amount: Small (1-33%) Exposed Structure Granulation Quality: Red, Pink Fascia Exposed: No Necrotic Amount: Large (67-100%) Fat Layer (Subcutaneous Tissue) Exposed: Yes Necrotic Quality: Adherent Slough Tendon Exposed: No Muscle Exposed: No Joint  Exposed: No Bone Exposed: No Periwound Skin Texture Texture Color No Abnormalities Noted: No No Abnormalities Noted: No Callus: Yes Atrophie Blanche: No Crepitus: No Cyanosis: No Excoriation: No Ecchymosis: No Induration: No Erythema: No Rash: No Hemosiderin Staining: No Scarring: No Mottled: No Pallor: No Moisture Rubor: No No Abnormalities Noted: No Dry / Scaly: No Temperature / Pain Maceration: No Temperature: No Abnormality Treatment Notes Wound #5 (Foot) Wound Laterality: Right, Lateral Cleanser Soap and Water Discharge Instruction: May shower and wash wound with dial antibacterial soap and water prior to dressing change. Peri-Wound Care Topical Gentamicin Discharge Instruction: As directed by physician Mupirocin Ointment Discharge Instruction: Apply Mupirocin (Bactroban) as instructed [name], [name] ([id number]) [name].pdf Page 8 of 8 Primary Dressing Hydrofera Blue Ready Foam, 2.5 x2.5 in Discharge Instruction: Apply to wound bed as instructed Santyl Ointment Discharge Instruction: In clinic only. Apply nickel thick amount to wound bed as instructed Secondary Dressing Optifoam Non-Adhesive Dressing, 4x4 in Discharge Instruction: Apply over primary dressing as directed. Woven Gauze Sponge, Non-Sterile 4x4 in Discharge Instruction: Apply over primary dressing as directed. Secured With 99M Medipore H Soft Cloth Surgical T ape, 4 x 10 (in/yd) Discharge Instruction: Secure with tape as directed. Compression  Wrap Compression Stockings Add-Ons Electronic Signature(s) Signed: 11/30/2021 8:52:44 AM By: [name] Signed: 12/02/2021 4:29:25 PM By: [name] Hammock RN Entered By: [name] on 11/29/2021 11:51:06 -------------------------------------------------------------------------------- Vitals Details Patient Name: Date of Service: [name], [name] 11/29/2021 11:30 [name] M Medical Record Number: [id number] Patient Account Number: [id number] Date of Birth/Sex: Treating RN: 12-Jun-1955 (66 y.o. [name], [name] Primary Care Carletha Dawn: [name] Other Clinician: Referring Yusef Lamp: Treating Kalisa Girtman/Extender: [name] in Treatment: 8 Vital Signs Time Taken: 11:29 Temperature (F): 98 Pulse (bpm): 80 Respiratory Rate (breaths/min): 18 Blood Pressure (mmHg): 152/65 Capillary Blood Glucose (mg/dl): 143 Reference Range: 80 - 120 mg / dl Electronic Signature(s) Signed: 11/30/2021 8:52:44 AM By: [name] Entered By: [name] on 11/29/2021 11:29:57

## 2021-12-02 NOTE — Progress Notes (Signed)
[name], [name] ([id number]) [id number] Page 1 of 10 Visit Report for 11/29/2021 Chief Complaint Document Details Patient Name: Date of Service: [name], [name] 11/29/2021 11:30 A M Medical Record Number: [id number] Patient Account Number: [id number] Date of Birth/Sex: Treating RN: [birth year] (66 y.o. [name], [name] Primary Care Provider: [name] Other Clinician: Referring Provider: Treating Provider/Extender: [name] in Treatment: 8 Information Obtained from: Patient Chief Complaint 10/27; Right Plantar foot wound 11/21; 2 small areas of skin breakdown to the right foot following cast placement 8/29; right plantar foot wound Electronic Signature(s) Signed: 11/29/2021 12:26:27 PM By: Kalman Shan DO Entered By: Kalman Shan on 11/29/2021 12:17:26 -------------------------------------------------------------------------------- Debridement Details Patient Name: Date of Service: [name], [name] 11/29/2021 11:30 A M Medical Record Number: [id number] Patient Account Number: [id number] Date of Birth/Sex: Treating RN: [birth year] (66 y.o. [name], [name] Primary Care Provider: [name] Other Clinician: Referring Provider: Treating Provider/Extender: [name] in Treatment: 8 Debridement Performed for Assessment: Wound #5 Right,Lateral Foot Performed By: Physician Kalman Shan, DO Debridement Type: Debridement Severity of Tissue Pre Debridement: Fat layer exposed Level of Consciousness (Pre-procedure): Awake and Alert Pre-procedure Verification/Time Out Yes - 12:05 Taken: Start Time: 12:05 Pain Control: Lidocaine T Area Debrided (L x W): otal 1.2 (cm) x 1.4 (cm) = 1.68 (cm) Tissue and other material debrided: Viable, Non-Viable, Slough, Subcutaneous, Slough Level: Skin/Subcutaneous Tissue Debridement Description:  Excisional Instrument: Curette Bleeding: Minimum Hemostasis Achieved: Pressure End Time: 12:05 Procedural Pain: 0 Post Procedural Pain: 0 Response to Treatment: Procedure was tolerated well Level of Consciousness (Post- Awake and Alert procedure): Post Debridement Measurements of Total Wound Length: (cm) 1.2 Width: (cm) 1.4 Depth: (cm) 0.3 Volume: (cm) 0.396 Sprong, Adhya [name] ([id number]) [id number] Page 2 of 10 Character of Wound/Ulcer Post Debridement: Improved Severity of Tissue Post Debridement: Fat layer exposed Post Procedure Diagnosis Same as Pre-procedure Electronic Signature(s) Signed: 11/29/2021 12:47:36 PM By: Kalman Shan DO Signed: 12/02/2021 4:29:25 PM By: Rhae Hammock RN Entered By: Rhae Hammock on 11/29/2021 12:37:52 -------------------------------------------------------------------------------- HPI Details Patient Name: Date of Service: [name], [name] 11/29/2021 11:30 A M Medical Record Number: [id number] Patient Account Number: [id number] Date of Birth/Sex: Treating RN: 1955-12-09 (66 y.o. [name], [name] Primary Care Provider: [name] Other Clinician: Referring Provider: Treating Provider/Extender: [name] in Treatment: 8 History of Present Illness HPI Description: Admission 12/17/2020 [name] a 66 year old female with a past medical history of insulin-dependent type 2 diabetes, hypothyroidism and daily1 pack per day cigarette smoker the presents to the clinic for a 6-week history of nonhealing wound to the right first MTPJ. She has been following with [name], podiatry for this issue. She has been using silver alginate with dressing changes. She uses a postsurgical shoe and offloading pads. She currently denies signs of infection. 10/31; patient presents for follow-up. She tolerated the soft cast fine although she states that she felt  her foot rolling to one side. She denies signs of infection. She would like to do the total contact cast today. 12/23/2020 upon evaluation today patient appears to be doing excellent in regard to her wound on the foot and she is in a total contact cast. I do think this is appropriate this is the first cast change which we are obliged to do to ensure nothing is rubbing everything appears to be doing quite well and very pleased in that  regard. 11/7; patient presents for follow-up. She had no issues with the cast. She denies signs of infection. 11/14; patient presents for 1 week follow-up. She has had no issues with the cast. She denies signs of infection. 11/21; patient presents for 1 week follow-up. She did develop 2 small areas of skin breakdown on either side of the right foot from the cast rubbing. She states she did not feel the wounds developing. She currently denies signs of infection. 11/28; patient presents for 1 week follow-up. She has no issues or complaints today. She denies pain or acute signs of infection. 12/12; patient presents for follow-up. She reports improvement to her right lateral foot wound. She has been using silver alginate to the area. She has no issues or complaints today. 12/19; patient presents for follow-up. She reports that her right lateral foot wound has healed. She has no issues or complaints today. 1/3; patient presents for follow-up. She has no issues or complaints today. She reports no open wounds. Readmission 10/01/2021 Ms. [name] Lamarca is a 66 year old female's with a past medical history of insulin-dependent controlled type 2 diabetes complicated by peripheral neuropathy that presents to the clinic for a 31-monthhistory of nonhealing ulcer to the right foot. I have seen her before for a wound to the same area that was treated and healed. Her wound today however this is much deeper and it has thick yellow drainage. She is not sure exactly how it started. She  noticed it 1 day. She has been following with Dr. EAmalia Haileyfor this issue. She had a wound culture that showed a mix of organisms on 09/01/2021. She was recently started on doxycycline. She is using the dConservation officer, nature She has been using Iodosorb with dressing changes. She currently denies systemic signs of infection. 8/21; patient presents for follow-up. She states she started and completed levofloxacin. She is still taking doxycycline. She reports improvement in drainage and odor. She has been doing Dakin's wet-to-dry dressings as well. She is using her front offloading shoe. She denies signs of infection. 8/29; patient presents for follow-up. She is still taking doxycycline. She continues to report improvement in drainage and reports no odor. She denies any purulent drainage. She has been doing silver alginate to the wound bed and using her front offloading shoe. 9/5; patient presents for follow-up. She completed the course of levofloxacin. She has someone that is able to drive her today so we can place the total contact cast. She reports no signs of infection. We have been doing silver alginate to the wound bed. 9/7; patient presents for follow-up. She has tolerated the total contact cast well. She presents for her obligatory cast change. She has no issues or complaints today. 9/11; patient presents for follow-up. She tolerated the total contact cast well. She has no issues or complaints today. We discussed potentially doing a skin substitute and patient would like to see if her insurance will cover this. 9/18; patient presents for follow-up. She had no issues with the total contact cast. She been approved for Grafix and patient would like to have this placed today. MBELLA, BRUMMET([id number] Page 3 of 10 9/25; patient presents for follow-up. Grafix #1 was placed in standard fashion at last clinic visit. She has no issues or complaints today. She tolerated  the cast well. 10/2; patient presents for follow-up. Grafix #2 was placed in standard fashion at last clinic visit. Unfortunately she developed a wound to the lateral aspect of the foot over the past  week. She states she has been on her feet a lot more and walking more. She denies systemic signs of infection. 10/9; patient presents for follow-up. Grafix #3 was placed in standard at last clinic visit. The plantar foot wound has healed with this. She developed a new wound to the lateral aspect last week and this has gotten larger. She reports soreness to this area. She denies systemic signs of infection. Electronic Signature(s) Signed: 11/29/2021 12:26:27 PM By: Kalman Shan DO Entered By: Kalman Shan on 11/29/2021 12:18:04 -------------------------------------------------------------------------------- Physical Exam Details Patient Name: Date of Service: [name], [name] 11/29/2021 11:30 A M Medical Record Number: [id number] Patient Account Number: [id number] Date of Birth/Sex: Treating RN: 1955/12/17 (66 y.o. [name], [name] Primary Care Provider: [name] Other Clinician: Referring Provider: Treating Provider/Extender: [name] in Treatment: 8 Constitutional respirations regular, non-labored and within target range for patient.. Cardiovascular 2+ dorsalis pedis/posterior tibialis pulses. Psychiatric pleasant and cooperative. Notes Right foot: T the first submetatarsal there is epithelization to the previous wound site. T the lateral aspect there is an open wound with nonviable tissue and a o o rim of granulated tissue. Mild erythema to the periwound. No purulent drainage or increased warmth. Electronic Signature(s) Signed: 11/29/2021 12:26:27 PM By: Kalman Shan DO Entered By: Kalman Shan on 11/29/2021 12:18:57 -------------------------------------------------------------------------------- Physician Orders  Details Patient Name: Date of Service: [name], [name] 11/29/2021 11:30 A M Medical Record Number: [id number] Patient Account Number: [id number] Date of Birth/Sex: Treating RN: 03-19-1955 (66 y.o. [name], [name] Primary Care Provider: [name] Other Clinician: Referring Provider: Treating Provider/Extender: [name] in Treatment: 8 Verbal / Phone Orders: No Diagnosis Coding ICD-10 Coding Code Description [phone] Non-pressure 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 E11.621 Type 2 diabetes mellitus with foot ulcer E11.42 Type 2 diabetes mellitus with diabetic polyneuropathy JAELANI, POSA ([id number]) [id number] Page 4 of 10 Follow-up Appointments ppointment in 1 week. - Monday w/ Dr. Heber Middleton and Allayne Butcher, Room 9 Return A Cellular or Tissue Based Products Wound #4 Right,Plantar Metatarsal head first Cellular or Tissue Based Product Type: - GRAFIX 16MM # 1 APPLIED 09//18//23 Grafix 2 x 3 # 1 APPLIED 11/15/21 Grafix 2 x 3 # 2 APPLIED 11/22/21 D/C Grafix-wound healed Bathing/ Shower/ Hygiene May shower with protection but do not get wound dressing(s) wet. Edema Control - Lymphedema / SCD / Other Elevate legs to the level of the heart or above for 30 minutes daily and/or when sitting, a frequency of: Avoid standing for long periods of time. Wound Treatment Wound #4 - Metatarsal head first Wound Laterality: Plantar, Right Cleanser: Soap and Water 1 x Per Week/7 Days Discharge Instructions: May shower and wash wound with dial antibacterial soap and water prior to dressing change. Cleanser: Wound Cleanser (Generic) 1 x Per Week/7 Days Discharge Instructions: Cleanse the wound with wound cleanser prior to applying a clean dressing using gauze sponges, not tissue or cotton balls. Prim Dressing: Maxorb Extra Calcium Alginate 2x2 in 1 x Per Week/7 Days ary Discharge Instructions:  Apply calcium alginate over sorbact. Prim Dressing: Grafix skin sub ary 1 x Per Week/7 Days Secondary Dressing: Optifoam Non-Adhesive Dressing, 4x4 in (Generic) 1 x Per Week/7 Days Discharge Instructions: Apply a foam donut one layer over primary dressing as directed. Secondary Dressing: Woven Gauze Sponge, Non-Sterile 4x4 in (Generic) 1 x Per Week/7 Days Discharge Instructions: Apply over primary dressing as directed.  Secured With: [employer], 4.5x3.1 (in/yd) (Generic) 1 x Per Week/7 Days Discharge Instructions: Secure with Kerlix as directed. Secured With: 52M Medipore H Soft Cloth Surgical T ape, 4 x 10 (in/yd) (Generic) 1 x Per Week/7 Days Discharge Instructions: Secure with tape as directed. Wound #5 - Foot Wound Laterality: Right, Lateral Cleanser: Soap and Water 1 x Per Week/7 Days Discharge Instructions: May shower and wash wound with dial antibacterial soap and water prior to dressing change. Topical: Gentamicin 1 x Per Week/7 Days Discharge Instructions: As directed by physician Topical: Mupirocin Ointment 1 x Per Week/7 Days Discharge Instructions: Apply Mupirocin (Bactroban) as instructed Prim Dressing: Hydrofera Blue Ready Foam, 2.5 x2.5 in 1 x Per Week/7 Days ary Discharge Instructions: Apply to wound bed as instructed Prim Dressing: Santyl Ointment 1 x Per Week/7 Days ary Discharge Instructions: In clinic only. Apply nickel thick amount to wound bed as instructed Secondary Dressing: Optifoam Non-Adhesive Dressing, 4x4 in 1 x Per Week/7 Days Discharge Instructions: Apply over primary dressing as directed. Secondary Dressing: Woven Gauze Sponge, Non-Sterile 4x4 in 1 x Per Week/7 Days Discharge Instructions: Apply over primary dressing as directed. Secured With: 52M Medipore H Soft Cloth Surgical T ape, 4 x 10 (in/yd) 1 x Per Week/7 Days Discharge Instructions: Secure with tape as directed. Electronic Signature(s) Signed: 11/29/2021 12:26:27 PM By: Kalman Shan  DO Signed: 12/02/2021 4:29:25 PM By: Rhae Hammock RN Previous Signature: 11/29/2021 12:20:30 PM Version By: Kalman Shan DO Entered By: Rhae Hammock on 11/29/2021 12:21:12 Scheeler, [name] ([id number]) [id number] Page 5 of 10 -------------------------------------------------------------------------------- Problem List Details Patient Name: Date of Service: MA Reita May [name] 11/29/2021 11:30 A M Medical Record Number: [id number] Patient Account Number: [id number] Date of Birth/Sex: Treating RN: 1955/06/29 (66 y.o. [name], [name] Primary Care Provider: [name] Other Clinician: Referring Provider: Treating Provider/Extender: [name] in Treatment: 8 Active Problems ICD-10 Encounter Code Description Active Date MDM Diagnosis L97.512 Non-pressure 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 10/01/2021 No Yes E11.621 Type 2 diabetes mellitus with foot ulcer 10/01/2021 No Yes E11.42 Type 2 diabetes mellitus with diabetic polyneuropathy 10/01/2021 No Yes Inactive Problems Resolved Problems Electronic Signature(s) Signed: 11/29/2021 12:26:27 PM By: Kalman Shan DO Entered By: Kalman Shan on 11/29/2021 12:17:12 -------------------------------------------------------------------------------- Progress Note Details Patient Name: Date of Service: [name], [name] 11/29/2021 11:30 A M Medical Record Number: [id number] Patient Account Number: [id number] Date of Birth/Sex: Treating RN: February 27, 1955 (65 y.o. [name], [name] Primary Care Provider: [name] Other Clinician: Referring Provider: Treating Provider/Extender: [name] in Treatment: 8 Subjective Chief Complaint Information obtained from Patient 10/27; Right Plantar foot wound 11/21; 2 small areas of skin breakdown to the right foot following cast placement  8/29; right plantar foot wound History of Present Illness (HPI) Admission 12/17/2020 [name] a 66 year old female with a past medical history of insulin-dependent type 2 diabetes, hypothyroidism and daily1 pack per day cigarette smoker the presents to the clinic for a 6-week history of nonhealing wound to the right first MTPJ. She has been following with [name], podiatry for this issue. She has been using silver alginate with dressing changes. She uses a postsurgical shoe and offloading pads. She currently denies signs of infection. 10/31; patient presents for follow-up. She tolerated the soft cast fine although she states that she felt her foot rolling to one side. She denies signs of infection. She  would like to do the total contact cast today. 12/23/2020 upon evaluation today patient appears to be doing excellent in regard to her wound on the foot and she is in a total contact cast. I do think this is TEWANA, BOHLEN ([id number]) [id number] Page 6 of 10 appropriate this is the first cast change which we are obliged to do to ensure nothing is rubbing everything appears to be doing quite well and very pleased in that regard. 11/7; patient presents for follow-up. She had no issues with the cast. She denies signs of infection. 11/14; patient presents for 1 week follow-up. She has had no issues with the cast. She denies signs of infection. 11/21; patient presents for 1 week follow-up. She did develop 2 small areas of skin breakdown on either side of the right foot from the cast rubbing. She states she did not feel the wounds developing. She currently denies signs of infection. 11/28; patient presents for 1 week follow-up. She has no issues or complaints today. She denies pain or acute signs of infection. 12/12; patient presents for follow-up. She reports improvement to her right lateral foot wound. She has been using silver alginate to the area. She  has no issues or complaints today. 12/19; patient presents for follow-up. She reports that her right lateral foot wound has healed. She has no issues or complaints today. 1/3; patient presents for follow-up. She has no issues or complaints today. She reports no open wounds. Readmission 10/01/2021 [name] a 66 year old female's with a past medical history of insulin-dependent controlled type 2 diabetes complicated by peripheral neuropathy that presents to the clinic for a 36-monthhistory of nonhealing ulcer to the right foot. I have seen her before for a wound to the same area that was treated and healed. Her wound today however this is much deeper and it has thick yellow drainage. She is not sure exactly how it started. She noticed it 1 day. She has been following with Dr. EAmalia Haileyfor this issue. She had a wound culture that showed a mix of organisms on 09/01/2021. She was recently started on doxycycline. She is using the dConservation officer, nature She has been using Iodosorb with dressing changes. She currently denies systemic signs of infection. 8/21; patient presents for follow-up. She states she started and completed levofloxacin. She is still taking doxycycline. She reports improvement in drainage and odor. She has been doing Dakin's wet-to-dry dressings as well. She is using her front offloading shoe. She denies signs of infection. 8/29; patient presents for follow-up. She is still taking doxycycline. She continues to report improvement in drainage and reports no odor. She denies any purulent drainage. She has been doing silver alginate to the wound bed and using her front offloading shoe. 9/5; patient presents for follow-up. She completed the course of levofloxacin. She has someone that is able to drive her today so we can place the total contact cast. She reports no signs of infection. We have been doing silver alginate to the wound bed. 9/7; patient presents for follow-up. She has tolerated  the total contact cast well. She presents for her obligatory cast change. She has no issues or complaints today. 9/11; patient presents for follow-up. She tolerated the total contact cast well. She has no issues or complaints today. We discussed potentially doing a skin substitute and patient would like to see if her insurance will cover this. 9/18; patient presents for follow-up. She had no issues with the total contact cast. She been approved  for Grafix and patient would like to have this placed today. 9/25; patient presents for follow-up. Grafix #1 was placed in standard fashion at last clinic visit. She has no issues or complaints today. She tolerated the cast well. 10/2; patient presents for follow-up. Grafix #2 was placed in standard fashion at last clinic visit. Unfortunately she developed a wound to the lateral aspect of the foot over the past week. She states she has been on her feet a lot more and walking more. She denies systemic signs of infection. 10/9; patient presents for follow-up. Grafix #3 was placed in standard at last clinic visit. The plantar foot wound has healed with this. She developed a new wound to the lateral aspect last week and this has gotten larger. She reports soreness to this area. She denies systemic signs of infection. Patient History Information obtained from Patient, Chart. Family History Unknown History. Social History Current every day smoker - 1 pack/a/day, Alcohol Use - Never, Drug Use - No History, Caffeine Use - Moderate. Medical History Eyes Denies history of Cataracts, Glaucoma, Optic Neuritis Ear/Nose/Mouth/Throat Denies history of Chronic sinus problems/congestion, Middle ear problems Hematologic/Lymphatic Denies history of Anemia, Hemophilia, Human Immunodeficiency Virus, Lymphedema, Sickle Cell Disease Respiratory Patient has history of Sleep Apnea Denies history of Aspiration, Asthma, Chronic Obstructive Pulmonary Disease (COPD),  Pneumothorax, Tuberculosis Cardiovascular Patient has history of Coronary Artery Disease, Hypertension Denies history of Angina, Arrhythmia, Congestive Heart Failure, Deep Vein Thrombosis, Hypotension, Myocardial Infarction, Peripheral Arterial Disease, Peripheral Venous Disease, Phlebitis, Vasculitis Gastrointestinal Denies history of Cirrhosis , Colitis, Crohnoos, Hepatitis A, Hepatitis B, Hepatitis C Endocrine Patient has history of Type II Diabetes Denies history of Type I Diabetes Genitourinary Denies history of End Stage Renal Disease Immunological Denies history of Lupus Erythematosus, Raynaudoos, Scleroderma Integumentary (Skin) Denies history of History of Burn Musculoskeletal Denies history of Gout, Rheumatoid Arthritis, Osteoarthritis, Osteomyelitis Neurologic Patient has history of Neuropathy SORN, Paidyn [name] ([id number]) [id number] Page 7 of 10 Denies history of Dementia, Quadriplegia, Paraplegia, Seizure Disorder Hospitalization/Surgery History - 2nd and 3rd right toe amps. - total hysterectomy. - cholecystectomy. - 2 knee surgery's on left and 1 on right. Medical A Surgical History Notes nd Cardiovascular hypercholesterolemia Objective Constitutional respirations regular, non-labored and within target range for patient.. Vitals Time Taken: 11:29 AM, Temperature: 98 F, Pulse: 80 bpm, Respiratory Rate: 18 breaths/min, Blood Pressure: 152/65 mmHg, Capillary Blood Glucose: 143 mg/dl. Cardiovascular 2+ dorsalis pedis/posterior tibialis pulses. Psychiatric pleasant and cooperative. General Notes: Right foot: T the first submetatarsal there is epithelization to the previous wound site. T the lateral aspect there is an open wound with o o nonviable tissue and a rim of granulated tissue. Mild erythema to the periwound. No purulent drainage or increased warmth. Integumentary (Hair, Skin) Wound #4 status is Open. Original cause of wound  was Gradually Appeared. The date acquired was: 07/22/2021. The wound has been in treatment 8 weeks. The wound is located on the Right,Plantar Metatarsal head first. The wound measures 0.1cm length x 0.1cm width x 0.1cm depth; 0.008cm^2 area and 0.001cm^3 volume. There is Fat Layer (Subcutaneous Tissue) exposed. There is no tunneling or undermining noted. There is a large amount of serosanguineous drainage noted. The wound margin is distinct with the outline attached to the wound base. There is large (67-100%) red, pink granulation within the wound bed. There is no necrotic tissue within the wound bed. The periwound skin appearance had no abnormalities noted for moisture. The periwound skin appearance had no abnormalities noted for color. The periwound  skin appearance exhibited: Callus. The periwound skin appearance did not exhibit: Crepitus, Excoriation, Induration, Rash, Scarring. Periwound temperature was noted as No Abnormality. Wound #5 status is Open. Original cause of wound was Blister. The date acquired was: 11/22/2021. The wound has been in treatment 1 weeks. The wound is located on the Right,Lateral Foot. The wound measures 1.2cm length x 1.4cm width x 0.3cm depth; 1.319cm^2 area and 0.396cm^3 volume. There is Fat Layer (Subcutaneous Tissue) exposed. There is no tunneling or undermining noted. There is a medium amount of serosanguineous drainage noted. The wound margin is distinct with the outline attached to the wound base. There is small (1-33%) red, pink granulation within the wound bed. There is a large (67-100%) amount of necrotic tissue within the wound bed including Adherent Slough. The periwound skin appearance exhibited: Callus. The periwound skin appearance did not exhibit: Crepitus, Excoriation, Induration, Rash, Scarring, Dry/Scaly, Maceration, Atrophie Blanche, Cyanosis, Ecchymosis, Hemosiderin Staining, Mottled, Pallor, Rubor, Erythema. Periwound temperature was noted as No  Abnormality. Assessment Active Problems ICD-10 Non-pressure 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 Type 2 diabetes mellitus with foot ulcer Type 2 diabetes mellitus with diabetic polyneuropathy Patient's plantar wound has healed with Grafix and offloading with a total contact cast. I recommended she continue to inspect her feet daily to assure that this does not reopen. Unfortunately the lateral wound has gotten larger and is now feeling sore. I debrided nonviable tissue. I will give her antibiotics and recommended Hydrofera Blue daily. We gave her a surgical shoe with offloading donut foam pad. Follow-up in 1 week. Plan The following medication(s) was prescribed: amoxicillin-pot clavulanate oral 875 mg-125 mg tablet 1 1 tablet oral BID x 7 days starting 11/29/2021 doxycycline hyclate oral 100 mg tablet 1 1 tablet oral BID x 7 days starting 11/29/2021 1. In office sharp debridement 2. Hydrofera Blue 3. Augmentin and doxycycline 4. Aggressive offloadingoosurgical shoe and offloading donut pad 5. Follow-up in 1 week [name], [name] ([id number]) [id number] Page 8 of 10 Electronic Signature(s) Signed: 11/29/2021 12:26:27 PM By: Kalman Shan DO Entered By: Kalman Shan on 11/29/2021 12:24:58 -------------------------------------------------------------------------------- HxROS Details Patient Name: Date of Service: [name], [name] 11/29/2021 11:30 A M Medical Record Number: [id number] Patient Account Number: [id number] Date of Birth/Sex: Treating RN: [birth year] (66 y.o. [name], [name] Primary Care Provider: [name] Other Clinician: Referring Provider: Treating Provider/Extender: [name] in Treatment: 8 Information Obtained From Patient Chart Eyes Medical History: Negative for: Cataracts; Glaucoma; Optic Neuritis Ear/Nose/Mouth/Throat Medical History: Negative  for: Chronic sinus problems/congestion; Middle ear problems Hematologic/Lymphatic Medical History: Negative for: Anemia; Hemophilia; Human Immunodeficiency Virus; Lymphedema; Sickle Cell Disease Respiratory Medical History: Positive for: Sleep Apnea Negative for: Aspiration; Asthma; Chronic Obstructive Pulmonary Disease (COPD); Pneumothorax; Tuberculosis Cardiovascular Medical History: Positive for: Coronary Artery Disease; Hypertension Negative for: Angina; Arrhythmia; Congestive Heart Failure; Deep Vein Thrombosis; Hypotension; Myocardial Infarction; Peripheral Arterial Disease; Peripheral Venous Disease; Phlebitis; Vasculitis Past Medical History Notes: hypercholesterolemia Gastrointestinal Medical History: Negative for: Cirrhosis ; Colitis; Crohns; Hepatitis A; Hepatitis B; Hepatitis C Endocrine Medical History: Positive for: Type II Diabetes Negative for: Type I Diabetes Genitourinary Medical History: Negative for: End Stage Renal Disease Immunological Medical History: Negative for: Lupus Erythematosus; Raynauds; Scleroderma [name], [name] Kelcy [name] ([id number]) [id number] Page 9 of 10 Integumentary (Skin) Medical History: Negative for: History of Burn Musculoskeletal Medical History: Negative for: Gout; Rheumatoid Arthritis; Osteoarthritis; Osteomyelitis Neurologic Medical History: Positive for: Neuropathy Negative for: Dementia; Quadriplegia; Paraplegia;  Seizure Disorder Immunizations Pneumococcal Vaccine: Received Pneumococcal Vaccination: Yes Received Pneumococcal Vaccination On or After 60th Birthday: Yes Tetanus Vaccine: Last tetanus shot: 12/17/2020 Implantable Devices No devices added Hospitalization / Surgery History Type of Hospitalization/Surgery 2nd and 3rd right toe amps total hysterectomy cholecystectomy 2 knee surgery's on left and 1 on right Family and Social History Unknown History: Yes; Current every day smoker - 1  pack/a/day; Alcohol Use: Never; Drug Use: No History; Caffeine Use: Moderate; Financial Concerns: No; Food, Clothing or Shelter Needs: No; Support System Lacking: No; Transportation Concerns: No Electronic Signature(s) Signed: 11/29/2021 12:26:27 PM By: Kalman Shan DO Signed: 12/02/2021 4:29:25 PM By: Rhae Hammock RN Entered By: Kalman Shan on 11/29/2021 12:18:10 -------------------------------------------------------------------------------- [place] Details Patient Name: Date of Service: [name], [name] 11/29/2021 Medical Record Number: [id number] Patient Account Number: [id number] Date of Birth/Sex: Treating RN: September 05, 1955 (66 y.o. [name], [name] Primary Care Provider: [name] Other Clinician: Referring Provider: Treating Provider/Extender: [name] in Treatment: 8 Diagnosis Coding ICD-10 Codes Code Description [phone] Non-pressure 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 E11.621 Type 2 diabetes mellitus with foot ulcer E11.42 Type 2 diabetes mellitus with diabetic polyneuropathy Facility Procedures : SOFIJA, [name] Code: [id number] [name] [phone] Description: 22482 - DEB SUBQ TISSUE 20 SQ CM/< ICD-10 Diagnosis Description L97.512 Non-pressure 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 500) [id number] C00.349 Type 2 diabetes mellitus with foot ulcer Modifier: [id number] Quantity: 1 27.pdf Page 10 of 10 Physician Procedures : CPT4 Code Description Modifier [id number] - WC PHYS LEVEL 4 - EST PT ICD-10 Diagnosis Description L97.512 Non-pressure 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 E11.621 Type 2 diabetes mellitus with foot ulcer E11.42 Type 2  diabetes mellitus with diabetic polyneuropathy Quantity: 1 : [id number] 11042 - WC PHYS SUBQ TISS 20 SQ CM ICD-10 Diagnosis Description L97.512 Non-pressure chronic ulcer of other part  of right foot with fat layer exposed E11.621 Type 2 diabetes mellitus with foot ulcer Quantity: 1 Electronic Signature(s) Signed: 11/29/2021 12:47:36 PM By: Kalman Shan DO Signed: 12/02/2021 4:29:25 PM By: Rhae Hammock RN Previous Signature: 11/29/2021 12:26:27 PM Version By: Kalman Shan DO Entered By: Rhae Hammock on 11/29/2021 12:38:36

## 2021-12-06 ENCOUNTER — Encounter (INDEPENDENT_AMBULATORY_CARE_PROVIDER_SITE_OTHER): Payer: Medicare Other | Admitting: Ophthalmology

## 2021-12-06 ENCOUNTER — Encounter (HOSPITAL_BASED_OUTPATIENT_CLINIC_OR_DEPARTMENT_OTHER): Payer: Medicare Other | Admitting: Internal Medicine

## 2021-12-06 ENCOUNTER — Encounter (INDEPENDENT_AMBULATORY_CARE_PROVIDER_SITE_OTHER): Payer: Self-pay

## 2021-12-06 DIAGNOSIS — H43823 Vitreomacular adhesion, bilateral: Secondary | ICD-10-CM | POA: Diagnosis not present

## 2021-12-06 DIAGNOSIS — L97512 Non-pressure 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: Secondary | ICD-10-CM | POA: Diagnosis not present

## 2021-12-06 DIAGNOSIS — E1142 Type 2 diabetes mellitus with diabetic polyneuropathy: Secondary | ICD-10-CM | POA: Diagnosis not present

## 2021-12-06 DIAGNOSIS — E11621 Type 2 diabetes mellitus with foot ulcer: Secondary | ICD-10-CM | POA: Diagnosis not present

## 2021-12-06 DIAGNOSIS — G4733 Obstructive sleep apnea (adult) (pediatric): Secondary | ICD-10-CM | POA: Diagnosis not present

## 2021-12-06 DIAGNOSIS — E113492 Type 2 diabetes mellitus with severe nonproliferative diabetic retinopathy without macular edema, left eye: Secondary | ICD-10-CM | POA: Diagnosis not present

## 2021-12-06 DIAGNOSIS — F17210 Nicotine dependence, cigarettes, uncomplicated: Secondary | ICD-10-CM | POA: Diagnosis not present

## 2021-12-06 DIAGNOSIS — E113411 Type 2 diabetes mellitus with severe nonproliferative diabetic retinopathy with macular edema, right eye: Secondary | ICD-10-CM | POA: Diagnosis not present

## 2021-12-09 NOTE — Progress Notes (Signed)
[name], [name] ([id number]) [id number] Page 1 of 8 Visit Report for 12/06/2021 Arrival Information Details Patient Name: Date of Service: [name], [name] 12/06/2021 10:45 A M Medical Record Number: [id number] Patient Account Number: [id number] Date of Birth/Sex: Treating RN: 25-Oct-1955 (66 y.o. [name], [name] Primary Care Wakisha Alberts: [name] Other Clinician: Referring Whitfield Dulay: Treating Asuncion Shibata/Extender: [name] in Treatment: 9 Visit Information History Since Last Visit Added or deleted any medications: No Patient Arrived: Ambulatory Any new allergies or adverse reactions: No Arrival Time: 07:42 Had a fall or experienced change in No Accompanied By: self activities of daily living that [name] affect Transfer Assistance: None risk of falls: Patient Identification Verified: Yes Signs or symptoms of abuse/neglect since last visito No Secondary Verification Process Completed: Yes Hospitalized since last visit: No Patient Requires Transmission-Based Precautions: No Implantable device outside of the clinic excluding No Patient Has Alerts: No cellular tissue based products placed in the center since last visit: Has Dressing in Place as Prescribed: Yes Pain Present Now: No Electronic Signature(s) Signed: 12/09/2021 4:05:37 PM By: Rhae Hammock RN Entered By: Rhae Hammock on 12/06/2021 10:42:56 -------------------------------------------------------------------------------- Encounter Discharge Information Details Patient Name: Date of Service: [name] 12/06/2021 10:45 A M Medical Record Number: [id number] Patient Account Number: [id number] Date of Birth/Sex: Treating RN: [birth year] (66 y.o. [name], [name] Primary Care Kobie Matkins: [name] Other Clinician: Referring Kathryn Linarez: Treating Zyquan Crotty/Extender: [name] in Treatment:  9 Encounter Discharge Information Items Post Procedure Vitals Discharge Condition: Stable Temperature (F): 97.9 Ambulatory Status: Ambulatory Pulse (bpm): 89 Discharge Destination: Home Respiratory Rate (breaths/min): 18 Transportation: Private Auto Blood Pressure (mmHg): 147/74 Accompanied By: self Schedule Follow-up Appointment: Yes Clinical Summary of Care: Patient Declined Electronic Signature(s) Signed: 12/09/2021 4:05:37 PM By: Rhae Hammock RN Entered By: Rhae Hammock on 12/06/2021 11:23:50 [name] ([id number]) [id number] Page 2 of 8 -------------------------------------------------------------------------------- Lower Extremity Assessment Details Patient Name: Date of Service: [name] [name] [name]. 12/06/2021 10:45 A M Medical Record Number: [id number] Patient Account Number: [id number] Date of Birth/Sex: Treating RN: Oct 05, 1955 (66 y.o. [name], [name] Primary Care Crissa Sowder: [name] Other Clinician: Referring Mikkel Charrette: Treating Sophee Mckimmy/Extender: [name] in Treatment: 9 Edema Assessment Assessed: [name]: No] Patrice Paradise: Yes] Edema: [Left: Ye] [Right: s] Calf Left: Right: Point of Measurement: From Medial Instep 46.1 cm Ankle Left: Right: Point of Measurement: From Medial Instep 24.4 cm Vascular Assessment Pulses: Dorsalis Pedis Palpable: [Right:Yes] Posterior Tibial Palpable: [Right:Yes] Electronic Signature(s) Signed: 12/09/2021 4:05:37 PM By: Rhae Hammock RN Entered By: Rhae Hammock on 12/06/2021 10:45:53 -------------------------------------------------------------------------------- Multi Wound Chart Details Patient Name: Date of Service: [name] 12/06/2021 10:45 A M Medical Record Number: [id number] Patient Account Number: [id number] Date of Birth/Sex: Treating RN: 06-27-1955 (66 y.o. F) Primary Care Deshon Hsiao: [name]  Other Clinician: Referring Arlind Klingerman: Treating Shep Porter/Extender: [name] in Treatment: 9 Vital Signs Height(in): Capillary Blood Glucose(mg/dl): 125 Weight(lbs): Pulse(bpm): 63 Body Mass Index(BMI): Blood Pressure(mmHg): 140/81 Temperature(F): 97.4 Respiratory Rate(breaths/min): 17 [5:Photos:] [N/A:N/A] Right, Lateral Foot N/A N/A Wound Location: Blister N/A N/A Wounding Event: Diabetic Wound/Ulcer of the Lower N/A N/A Primary Etiology: Extremity Sleep Apnea, Coronary Artery N/A N/A Comorbid History: Disease, Hypertension, Type II Diabetes, Neuropathy 11/22/2021 N/A N/A Date Acquired: 2 N/A N/A Weeks of Treatment: Open N/A N/A Wound Status: No N/A N/A Wound Recurrence: 0.8x1x0.1 N/A N/A Measurements L x W  x D (cm) 0.628 N/A N/A A (cm) : rea 0.063 N/A N/A Volume (cm) : 64.50% N/A N/A % Reduction in A rea: 88.10% N/A N/A % Reduction in Volume: Grade 2 N/A N/A Classification: Medium N/A N/A Exudate A mount: Serosanguineous N/A N/A Exudate Type: red, brown N/A N/A Exudate Color: Distinct, outline attached N/A N/A Wound Margin: Small (1-33%) N/A N/A Granulation A mount: Red, Pink N/A N/A Granulation Quality: Large (67-100%) N/A N/A Necrotic A mount: Fat Layer (Subcutaneous Tissue): Yes N/A N/A Exposed Structures: Fascia: No Tendon: No Muscle: No Joint: No Bone: No Small (1-33%) N/A N/A Epithelialization: Debridement - Excisional N/A N/A Debridement: Pre-procedure Verification/Time Out 11:20 N/A N/A Taken: Lidocaine N/A N/A Pain Control: Subcutaneous, Slough N/A N/A Tissue Debrided: Skin/Subcutaneous Tissue N/A N/A Level: 0.8 N/A N/A Debridement A (sq cm): rea Curette N/A N/A Instrument: Minimum N/A N/A Bleeding: Pressure N/A N/A Hemostasis A chieved: 0 N/A N/A Procedural Pain: 0 N/A N/A Post Procedural Pain: Procedure was tolerated well N/A N/A Debridement Treatment Response: 0.8x1x0.1 N/A  N/A Post Debridement Measurements L x W x D (cm) 0.063 N/A N/A Post Debridement Volume: (cm) Callus: Yes N/A N/A Periwound Skin Texture: Excoriation: No Induration: No Crepitus: No Rash: No Scarring: No Maceration: No N/A N/A Periwound Skin Moisture: Dry/Scaly: No Atrophie Blanche: No N/A N/A Periwound Skin Color: Cyanosis: No Ecchymosis: No Erythema: No Hemosiderin Staining: No Mottled: No Pallor: No Rubor: No No Abnormality N/A N/A Temperature: Debridement N/A N/A Procedures Performed: Treatment Notes Wound #5 (Foot) Wound Laterality: Right, Lateral Cleanser Soap and Water Discharge Instruction: [name] shower and wash wound with dial antibacterial soap and water prior to dressing change. Peri-Wound Care Topical Gentamicin Discharge Instruction: As directed by physician Mupirocin Ointment Discharge Instruction: Apply Mupirocin (Bactroban) as instructed [name], [name] ([id number]) [id number].pdf Page 4 of 8 Primary Dressing Hydrofera Blue Ready Foam, 2.5 x2.5 in Discharge Instruction: Apply to wound bed as instructed Santyl Ointment Discharge Instruction: In clinic only. Apply nickel thick amount to wound bed as instructed Secondary Dressing Optifoam Non-Adhesive Dressing, 4x4 in Discharge Instruction: Apply over primary dressing as directed. Woven Gauze Sponge, Non-Sterile 4x4 in Discharge Instruction: Apply over primary dressing as directed. Secured With 66M Medipore H Soft Cloth Surgical T ape, 4 x 10 (in/yd) Discharge Instruction: Secure with tape as directed. Compression Wrap Compression Stockings Add-Ons Electronic Signature(s) Signed: 12/06/2021 9:25:17 AM By: Kalman Shan DO Entered By: Kalman Shan on 12/06/2021 11:30:51 -------------------------------------------------------------------------------- Multi-Disciplinary Care Plan Details Patient Name: Date of Service: [name] 12/06/2021 10:45 A  M Medical Record Number: [id number] Patient Account Number: [id number] Date of Birth/Sex: Treating RN: 1955/09/21 (66 y.o. [name], [name] Primary Care Trapper Meech: [name] Other Clinician: Referring Geralda Baumgardner: Treating Meriah Shands/Extender: [name] in Treatment: 9 Active Inactive Wound/Skin Impairment Nursing Diagnoses: Impaired tissue integrity Knowledge deficit related to ulceration/compromised skin integrity Goals: Patient will have a decrease in wound volume by X% from date: (specify in notes) Date Initiated: 10/01/2021 Target Resolution Date: 12/18/2021 Goal Status: Active Patient/caregiver will verbalize understanding of skin care regimen Date Initiated: 10/01/2021 Target Resolution Date: 12/18/2021 Goal Status: Active Ulcer/skin breakdown will have a volume reduction of 30% by week 4 Date Initiated: 10/01/2021 Target Resolution Date: 12/18/2021 Goal Status: Active Interventions: Assess patient/caregiver ability to obtain necessary supplies Assess patient/caregiver ability to perform ulcer/skin care regimen upon admission and as needed Assess ulceration(s) every visit Notes: Electronic Signature(s) [name], [name] ([id number]) [id number].pdf Page 5 of 8 Signed: 12/09/2021 4:05:37  PM By: Rhae Hammock RN Entered By: Rhae Hammock on 12/06/2021 10:56:13 -------------------------------------------------------------------------------- Pain Assessment Details Patient Name: Date of Service: [name] 12/06/2021 10:45 A M Medical Record Number: [id number] Patient Account Number: [id number] Date of Birth/Sex: Treating RN: April 04, 1955 (66 y.o. [name], [name] Primary Care Addilynn Mowrer: [name] Other Clinician: Referring Josselin Gaulin: Treating Sanyla Summey/Extender: [name] in Treatment: 9 Active Problems Location of Pain Severity and Description of  Pain Patient Has Paino No Site Locations Pain Management and Medication Current Pain Management: Electronic Signature(s) Signed: 12/09/2021 4:05:37 PM By: Rhae Hammock RN Entered By: Rhae Hammock on 12/06/2021 10:43:25 -------------------------------------------------------------------------------- Patient/Caregiver Education Details Patient Name: Date of Service: [name] 10/16/2023andnbsp10:45 A M Medical Record Number: [id number] Patient Account Number: [id number] Date of Birth/Gender: Treating RN: January 08, 1956 (66 y.o. [name], [name] Primary Care Physician: [name] Other Clinician: Referring Physician: Treating Physician/Extender: [name] in Treatment: 9 Education Assessment Education Provided To: Patient [name], [name] ([id number]) [id number] Page 6 of 8 Education Topics Provided Wound/Skin Impairment: Methods: Explain/Verbal Responses: Reinforcements needed, State content correctly Electronic Signature(s) Signed: 12/09/2021 4:05:37 PM By: Rhae Hammock RN Entered By: Rhae Hammock on 12/06/2021 10:56:30 -------------------------------------------------------------------------------- Wound Assessment Details Patient Name: Date of Service: [name] 12/06/2021 10:45 A M Medical Record Number: [id number] Patient Account Number: [id number] Date of Birth/Sex: Treating RN: [birth year] (66 y.o. [name], [name] Primary Care Mahkai Fangman: [name] Other Clinician: Referring Ilana Prezioso: Treating Joli Koob/Extender: [name] in Treatment: 9 Wound Status Wound Number: 5 Primary Diabetic Wound/Ulcer of the Lower Extremity Etiology: Wound Location: Right, Lateral Foot Wound Open Wounding Event: Blister Status: Date Acquired: 11/22/2021 Comorbid Sleep Apnea, Coronary Artery Disease, Hypertension, Type II Weeks Of  Treatment: 2 History: Diabetes, Neuropathy Clustered Wound: No Photos Wound Measurements Length: (cm) 0.8 Width: (cm) 1 Depth: (cm) 0.1 Area: (cm) 0.628 Volume: (cm) 0.063 % Reduction in Area: 64.5% % Reduction in Volume: 88.1% Epithelialization: Small (1-33%) Tunneling: No Undermining: No Wound Description Classification: Grade 2 Wound Margin: Distinct, outline attached Exudate Amount: Medium Exudate Type: Serosanguineous Exudate Color: red, brown Foul Odor After Cleansing: No Slough/Fibrino Yes Wound Bed Granulation Amount: Small (1-33%) Exposed Structure Granulation Quality: Red, Pink Fascia Exposed: No Necrotic Amount: Large (67-100%) Fat Layer (Subcutaneous Tissue) Exposed: Yes Necrotic Quality: Adherent Slough Tendon Exposed: No Muscle Exposed: No Joint Exposed: No Bone Exposed: No Periwound Skin Texture [name], [name] ([id number]) [id number] Page 7 of 8 Texture Color No Abnormalities Noted: No No Abnormalities Noted: No Callus: Yes Atrophie Blanche: No Crepitus: No Cyanosis: No Excoriation: No Ecchymosis: No Induration: No Erythema: No Rash: No Hemosiderin Staining: No Scarring: No Mottled: No Pallor: No Moisture Rubor: No No Abnormalities Noted: No Dry / Scaly: No Temperature / Pain Maceration: No Temperature: No Abnormality Treatment Notes Wound #5 (Foot) Wound Laterality: Right, Lateral Cleanser Soap and Water Discharge Instruction: [name] shower and wash wound with dial antibacterial soap and water prior to dressing change. Peri-Wound Care Topical Gentamicin Discharge Instruction: As directed by physician Mupirocin Ointment Discharge Instruction: Apply Mupirocin (Bactroban) as instructed Primary Dressing Hydrofera Blue Ready Foam, 2.5 x2.5 in Discharge Instruction: Apply to wound bed as instructed Santyl Ointment Discharge Instruction: In clinic only. Apply nickel thick amount to wound bed as  instructed Secondary Dressing Optifoam Non-Adhesive Dressing, 4x4 in Discharge Instruction: Apply over primary dressing as directed. Woven Gauze Sponge, Non-Sterile 4x4 in Discharge Instruction: Apply  over primary dressing as directed. Secured With 54M Medipore H Soft Cloth Surgical T ape, 4 x 10 (in/yd) Discharge Instruction: Secure with tape as directed. Compression Wrap Compression Stockings Add-Ons Electronic Signature(s) Signed: 12/09/2021 4:05:37 PM By: Rhae Hammock RN Entered By: Rhae Hammock on 12/06/2021 10:47:43 -------------------------------------------------------------------------------- Vitals Details Patient Name: Date of Service: [name] 12/06/2021 10:45 A M Medical Record Number: [id number] Patient Account Number: [id number] Date of Birth/Sex: Treating RN: [birth year] (66 y.o. [name], [name] Primary Care Kimberley Speece: [name] Other Clinician: Referring Betheny Suchecki: Treating Daleisa Halperin/Extender: [name] in Treatment: [address], [name] ([id number]) [phone].pdf Page 8 of 8 Time Taken: 10:43 Temperature (F): 97.4 Pulse (bpm): 84 Respiratory Rate (breaths/min): 17 Blood Pressure (mmHg): 140/81 Capillary Blood Glucose (mg/dl): 125 Reference Range: 80 - 120 mg / dl Electronic Signature(s) Signed: 12/09/2021 4:05:37 PM By: Rhae Hammock RN Entered By: Rhae Hammock on 12/06/2021 10:43:19

## 2021-12-09 NOTE — Progress Notes (Signed)
[name], [name] ([id number]) [id number] Page 1 of 9 Visit Report for 12/06/2021 Chief Complaint Document Details Patient Name: Date of Service: [name], [name] 12/06/2021 10:45 A M Medical Record Number: [id number] Patient Account Number: [id number] Date of Birth/Sex: Treating RN: 1955-10-18 (66 y.o. F) Primary Care Provider: [name] Other Clinician: Referring Provider: Treating Provider/Extender: [name] in Treatment: 9 Information Obtained from: Patient Chief Complaint 10/27; Right Plantar foot wound 11/21; 2 small areas of skin breakdown to the right foot following cast placement 8/29; right plantar foot wound Electronic Signature(s) Signed: 12/06/2021 9:25:17 AM By: Kalman Shan DO Entered By: Kalman Shan on 12/06/2021 11:31:04 -------------------------------------------------------------------------------- Debridement Details Patient Name: Date of Service: [name], [name] 12/06/2021 10:45 A M Medical Record Number: [id number] Patient Account Number: [id number] Date of Birth/Sex: Treating RN: 1955/07/17 (66 y.o. [name], [name] Primary Care Provider: [name] Other Clinician: Referring Provider: Treating Provider/Extender: [name] in Treatment: 9 Debridement Performed for Assessment: Wound #5 Right,Lateral Foot Performed By: Physician Kalman Shan, DO Debridement Type: Debridement Severity of Tissue Pre Debridement: Fat layer exposed Level of Consciousness (Pre-procedure): Awake and Alert Pre-procedure Verification/Time Out Yes - 11:20 Taken: Start Time: 11:20 Pain Control: Lidocaine T Area Debrided (L x W): otal 0.8 (cm) x 1 (cm) = 0.8 (cm) Tissue and other material debrided: Viable, Non-Viable, Slough, Subcutaneous, Slough Level: Skin/Subcutaneous Tissue Debridement Description: Excisional Instrument:  Curette Bleeding: Minimum Hemostasis Achieved: Pressure End Time: 11:20 Procedural Pain: 0 Post Procedural Pain: 0 Response to Treatment: Procedure was tolerated well Level of Consciousness (Post- Awake and Alert procedure): Post Debridement Measurements of Total Wound Length: (cm) 0.8 Width: (cm) 1 Depth: (cm) 0.1 Volume: (cm) 0.063 Born, [name] ([id number]) [id number] Page 2 of 9 Character of Wound/Ulcer Post Debridement: Improved Severity of Tissue Post Debridement: Fat layer exposed Post Procedure Diagnosis Same as Pre-procedure Electronic Signature(s) Signed: 12/06/2021 9:25:17 AM By: Kalman Shan DO Signed: 12/09/2021 4:05:37 PM By: Rhae Hammock RN Entered By: Rhae Hammock on 12/06/2021 11:22:49 -------------------------------------------------------------------------------- HPI Details Patient Name: Date of Service: [name], [name] 12/06/2021 10:45 A M Medical Record Number: [id number] Patient Account Number: [id number] Date of Birth/Sex: Treating RN: Sep 07, 1955 (66 y.o. F) Primary Care Provider: [name] Other Clinician: Referring Provider: Treating Provider/Extender: [name] in Treatment: 9 History of Present Illness HPI Description: Admission 12/17/2020 [name] is a 66 year old female with a past medical history of insulin-dependent type 2 diabetes, hypothyroidism and daily1 pack per day cigarette smoker the presents to the clinic for a 6-week history of nonhealing wound to the right first MTPJ. She has been following with [name], podiatry for this issue. She has been using silver alginate with dressing changes. She uses a postsurgical shoe and offloading pads. She currently denies signs of infection. 10/31; patient presents for follow-up. She tolerated the soft cast fine although she states that she felt her foot rolling to one side. She denies signs  of infection. She would like to do the total contact cast today. 12/23/2020 upon evaluation today patient appears to be doing excellent in regard to her wound on the foot and she is in a total contact cast. I do think this is appropriate this is the first cast change which we are obliged to do to ensure nothing is rubbing everything appears to be doing quite well and very pleased in that regard. 11/7; patient presents  for follow-up. She had no issues with the cast. She denies signs of infection. 11/14; patient presents for 1 week follow-up. She has had no issues with the cast. She denies signs of infection. 11/21; patient presents for 1 week follow-up. She did develop 2 small areas of skin breakdown on either side of the right foot from the cast rubbing. She states she did not feel the wounds developing. She currently denies signs of infection. 11/28; patient presents for 1 week follow-up. She has no issues or complaints today. She denies pain or acute signs of infection. 12/12; patient presents for follow-up. She reports improvement to her right lateral foot wound. She has been using silver alginate to the area. She has no issues or complaints today. 12/19; patient presents for follow-up. She reports that her right lateral foot wound has healed. She has no issues or complaints today. 1/3; patient presents for follow-up. She has no issues or complaints today. She reports no open wounds. Readmission 10/01/2021 [name] is a 67 year old female's with a past medical history of insulin-dependent controlled type 2 diabetes complicated by peripheral neuropathy that presents to the clinic for a 68-monthhistory of nonhealing ulcer to the right foot. I have seen her before for a wound to the same area that was treated and healed. Her wound today however this is much deeper and it has thick yellow drainage. She is not sure exactly how it started. She noticed it 1 day. She has been following with Dr.  EAmalia Haileyfor this issue. She had a wound culture that showed a mix of organisms on 09/01/2021. She was recently started on doxycycline. She is using the dConservation officer, nature She has been using Iodosorb with dressing changes. She currently denies systemic signs of infection. 8/21; patient presents for follow-up. She states she started and completed levofloxacin. She is still taking doxycycline. She reports improvement in drainage and odor. She has been doing Dakin's wet-to-dry dressings as well. She is using her front offloading shoe. She denies signs of infection. 8/29; patient presents for follow-up. She is still taking doxycycline. She continues to report improvement in drainage and reports no odor. She denies any purulent drainage. She has been doing silver alginate to the wound bed and using her front offloading shoe. 9/5; patient presents for follow-up. She completed the course of levofloxacin. She has someone that is able to drive her today so we can place the total contact cast. She reports no signs of infection. We have been doing silver alginate to the wound bed. 9/7; patient presents for follow-up. She has tolerated the total contact cast well. She presents for her obligatory cast change. She has no issues or complaints today. 9/11; patient presents for follow-up. She tolerated the total contact cast well. She has no issues or complaints today. We discussed potentially doing a skin substitute and patient would like to see if her insurance will cover this. 9/18; patient presents for follow-up. She had no issues with the total contact cast. She been approved for Grafix and patient would like to have this placed today. MTYTIANA, COLES([id number] Page 3 of 9 9/25; patient presents for follow-up. Grafix #1 was placed in standard fashion at last clinic visit. She has no issues or complaints today. She tolerated the cast well. 10/2; patient presents for  follow-up. Grafix #2 was placed in standard fashion at last clinic visit. Unfortunately she developed a wound to the lateral aspect of the foot over the past week. She states she  has been on her feet a lot more and walking more. She denies systemic signs of infection. 10/9; patient presents for follow-up. Grafix #3 was placed in standard at last clinic visit. The plantar foot wound has healed with this. She developed a new wound to the lateral aspect last week and this has gotten larger. She reports soreness to this area. She denies systemic signs of infection. 10/16; patient presents for follow-up. Her plantar wound continues to remain closed. Her lateral wound has improved with the use of Hydrofera Blue. She has no issues or complaints today. She denies signs of infection. She has almost completed her course of antibiotics prescribed at last clinic visit. Electronic Signature(s) Signed: 12/06/2021 9:25:17 AM By: Kalman Shan DO Entered By: Kalman Shan on 12/06/2021 11:31:49 -------------------------------------------------------------------------------- Physical Exam Details Patient Name: Date of Service: [name], [name] 12/06/2021 10:45 A M Medical Record Number: [id number] Patient Account Number: [id number] Date of Birth/Sex: Treating RN: 1956-01-20 (66 y.o. F) Primary Care Provider: [name] Other Clinician: Referring Provider: Treating Provider/Extender: [name] in Treatment: 9 Constitutional respirations regular, non-labored and within target range for patient.. Cardiovascular 2+ dorsalis pedis/posterior tibialis pulses. Psychiatric pleasant and cooperative. Notes T the lateral aspect of the right foot there is an open wound with nonviable tissue and epithelization occurring to the edges circumferentially. No increased o warmth, erythema or purulent drainage noted. Electronic Signature(s) Signed: 12/06/2021 9:25:17 AM  By: Kalman Shan DO Entered By: Kalman Shan on 12/06/2021 11:32:42 -------------------------------------------------------------------------------- Physician Orders Details Patient Name: Date of Service: [name], [name] 12/06/2021 10:45 A M Medical Record Number: [id number] Patient Account Number: [id number] Date of Birth/Sex: Treating RN: April 19, 1955 (66 y.o. [name], [name] Primary Care Provider: [name] Other Clinician: Referring Provider: Treating Provider/Extender: [name] in Treatment: 9 Verbal / Phone Orders: No Diagnosis Coding Follow-up Appointments ppointment in 1 week. - Monday w/ Dr. Heber  and Allayne Butcher, Room 9 Return A Bathing/ Shower/ Hygiene May shower with protection but do not get wound dressing(s) wet. SORAIYA, AHNER ([id number]) [id number] Page 4 of 9 Edema Control - Lymphedema / SCD / Other Elevate legs to the level of the heart or above for 30 minutes daily and/or when sitting, a frequency of: Avoid standing for long periods of time. Wound Treatment Wound #5 - Foot Wound Laterality: Right, Lateral Cleanser: Soap and Water 1 x Per Week/7 Days Discharge Instructions: May shower and wash wound with dial antibacterial soap and water prior to dressing change. Prim Dressing: Hydrofera Blue Ready Foam, 2.5 x2.5 in 1 x Per Week/7 Days ary Discharge Instructions: Apply to wound bed as instructed Prim Dressing: MediHoney Gel, tube 1.5 (oz) 1 x Per Week/7 Days ary Discharge Instructions: Apply to wound bed as instructed Secondary Dressing: Optifoam Non-Adhesive Dressing, 4x4 in 1 x Per Week/7 Days Discharge Instructions: Apply over primary dressing as directed. Secondary Dressing: Woven Gauze Sponge, Non-Sterile 4x4 in 1 x Per Week/7 Days Discharge Instructions: Apply over primary dressing as directed. Secured With: 49M Medipore H Soft Cloth Surgical T ape, 4 x 10 (in/yd) 1  x Per Week/7 Days Discharge Instructions: Secure with tape as directed. Electronic Signature(s) Signed: 12/06/2021 9:25:17 AM By: Kalman Shan DO Entered By: Kalman Shan on 12/06/2021 11:32:48 -------------------------------------------------------------------------------- Problem List Details Patient Name: Date of Service: [name], [name] 12/06/2021 10:45 A M Medical Record Number: [id number] Patient Account Number: [id number] Date of Birth/Sex: Treating RN: 1955-12-10 (  66 y.o. F) Primary Care Provider: [name] Other Clinician: Referring Provider: Treating Provider/Extender: [name] in Treatment: 9 Active Problems ICD-10 Encounter Code Description Active Date MDM Diagnosis L97.512 Non-pressure 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 10/01/2021 No Yes E11.621 Type 2 diabetes mellitus with foot ulcer 10/01/2021 No Yes E11.42 Type 2 diabetes mellitus with diabetic polyneuropathy 10/01/2021 No Yes Inactive Problems Resolved Problems Electronic Signature(s) Signed: 12/06/2021 9:25:17 AM By: Kalman Shan DO Entered By: Kalman Shan on 12/06/2021 11:30:44 Merendino, Carisma K ([id number]) [id number] Page 5 of 9 -------------------------------------------------------------------------------- Progress Note Details Patient Name: Date of Service: MA [name] 12/06/2021 10:45 A M Medical Record Number: [id number] Patient Account Number: [id number] Date of Birth/Sex: Treating RN: [birth year] (66 y.o. F) Primary Care Provider: [name] Other Clinician: Referring Provider: Treating Provider/Extender: [name] in Treatment: 9 Subjective Chief Complaint Information obtained from Patient 10/27; Right Plantar foot wound 11/21; 2 small areas of skin breakdown to the right foot following cast placement 8/29; right plantar  foot wound History of Present Illness (HPI) Admission 12/17/2020 [name] a 66 year old female with a past medical history of insulin-dependent type 2 diabetes, hypothyroidism and daily1 pack per day cigarette smoker the presents to the clinic for a 6-week history of nonhealing wound to the right first MTPJ. She has been following with [name], podiatry for this issue. She has been using silver alginate with dressing changes. She uses a postsurgical shoe and offloading pads. She currently denies signs of infection. 10/31; patient presents for follow-up. She tolerated the soft cast fine although she states that she felt her foot rolling to one side. She denies signs of infection. She would like to do the total contact cast today. 12/23/2020 upon evaluation today patient appears to be doing excellent in regard to her wound on the foot and she is in a total contact cast. I do think this is appropriate this is the first cast change which we are obliged to do to ensure nothing is rubbing everything appears to be doing quite well and very pleased in that regard. 11/7; patient presents for follow-up. She had no issues with the cast. She denies signs of infection. 11/14; patient presents for 1 week follow-up. She has had no issues with the cast. She denies signs of infection. 11/21; patient presents for 1 week follow-up. She did develop 2 small areas of skin breakdown on either side of the right foot from the cast rubbing. She states she did not feel the wounds developing. She currently denies signs of infection. 11/28; patient presents for 1 week follow-up. She has no issues or complaints today. She denies pain or acute signs of infection. 12/12; patient presents for follow-up. She reports improvement to her right lateral foot wound. She has been using silver alginate to the area. She has no issues or complaints today. 12/19; patient presents for follow-up. She reports that her right  lateral foot wound has healed. She has no issues or complaints today. 1/3; patient presents for follow-up. She has no issues or complaints today. She reports no open wounds. Readmission 10/01/2021 Ms. Janeece Blok is a 66 year old female's with a past medical history of insulin-dependent controlled type 2 diabetes complicated by peripheral neuropathy that presents to the clinic for a 42-monthhistory of nonhealing ulcer to the right foot. I have seen her before for a wound to the same area that was  treated and healed. Her wound today however this is much deeper and it has thick yellow drainage. She is not sure exactly how it started. She noticed it 1 day. She has been following with [name] for this issue. She had a wound culture that showed a mix of organisms on 09/01/2021. She was recently started on doxycycline. She is using the Conservation officer, nature. She has been using Iodosorb with dressing changes. She currently denies systemic signs of infection. 8/21; patient presents for follow-up. She states she started and completed levofloxacin. She is still taking doxycycline. She reports improvement in drainage and odor. She has been doing Dakin's wet-to-dry dressings as well. She is using her front offloading shoe. She denies signs of infection. 8/29; patient presents for follow-up. She is still taking doxycycline. She continues to report improvement in drainage and reports no odor. She denies any purulent drainage. She has been doing silver alginate to the wound bed and using her front offloading shoe. 9/5; patient presents for follow-up. She completed the course of levofloxacin. She has someone that is able to drive her today so we can place the total contact cast. She reports no signs of infection. We have been doing silver alginate to the wound bed. 9/7; patient presents for follow-up. She has tolerated the total contact cast well. She presents for her obligatory cast change. She has no issues or  complaints today. 9/11; patient presents for follow-up. She tolerated the total contact cast well. She has no issues or complaints today. We discussed potentially doing a skin substitute and patient would like to see if her insurance will cover this. 9/18; patient presents for follow-up. She had no issues with the total contact cast. She been approved for Grafix and patient would like to have this placed today. 9/25; patient presents for follow-up. Grafix #1 was placed in standard fashion at last clinic visit. She has no issues or complaints today. She tolerated the cast well. 10/2; patient presents for follow-up. Grafix #2 was placed in standard fashion at last clinic visit. Unfortunately she developed a wound to the lateral aspect of the foot over the past week. She states she has been on her feet a lot more and walking more. She denies systemic signs of infection. 10/9; patient presents for follow-up. Grafix #3 was placed in standard at last clinic visit. The plantar foot wound has healed with this. She developed a new wound to the lateral aspect last week and this has gotten larger. She reports soreness to this area. She denies systemic signs of infection. AMARIYAH, BAZAR ([id number]) [id number] Page 6 of 9 10/16; patient presents for follow-up. Her plantar wound continues to remain closed. Her lateral wound has improved with the use of Hydrofera Blue. She has no issues or complaints today. She denies signs of infection. She has almost completed her course of antibiotics prescribed at last clinic visit. Patient History Information obtained from Patient, Chart. Family History Unknown History. Social History Current every day smoker - 1 pack/a/day, Alcohol Use - Never, Drug Use - No History, Caffeine Use - Moderate. Medical History Eyes Denies history of Cataracts, Glaucoma, Optic Neuritis Ear/Nose/Mouth/Throat Denies history of Chronic sinus  problems/congestion, Middle ear problems Hematologic/Lymphatic Denies history of Anemia, Hemophilia, Human Immunodeficiency Virus, Lymphedema, Sickle Cell Disease Respiratory Patient has history of Sleep Apnea Denies history of Aspiration, Asthma, Chronic Obstructive Pulmonary Disease (COPD), Pneumothorax, Tuberculosis Cardiovascular Patient has history of Coronary Artery Disease, Hypertension Denies history of Angina, Arrhythmia, Congestive Heart Failure, Deep Vein Thrombosis,  Hypotension, Myocardial Infarction, Peripheral Arterial Disease, Peripheral Venous Disease, Phlebitis, Vasculitis Gastrointestinal Denies history of Cirrhosis , Colitis, Crohnoos, Hepatitis A, Hepatitis B, Hepatitis C Endocrine Patient has history of Type II Diabetes Denies history of Type I Diabetes Genitourinary Denies history of End Stage Renal Disease Immunological Denies history of Lupus Erythematosus, Raynaudoos, Scleroderma Integumentary (Skin) Denies history of History of Burn Musculoskeletal Denies history of Gout, Rheumatoid Arthritis, Osteoarthritis, Osteomyelitis Neurologic Patient has history of Neuropathy Denies history of Dementia, Quadriplegia, Paraplegia, Seizure Disorder Hospitalization/Surgery History - 2nd and 3rd right toe amps. - total hysterectomy. - cholecystectomy. - 2 knee surgery's on left and 1 on right. Medical A Surgical History Notes nd Cardiovascular hypercholesterolemia Objective Constitutional respirations regular, non-labored and within target range for patient.. Vitals Time Taken: 10:43 AM, Temperature: 97.4 F, Pulse: 84 bpm, Respiratory Rate: 17 breaths/min, Blood Pressure: 140/81 mmHg, Capillary Blood Glucose: 125 mg/dl. Cardiovascular 2+ dorsalis pedis/posterior tibialis pulses. Psychiatric pleasant and cooperative. General Notes: T the lateral aspect of the right foot there is an open wound with nonviable tissue and epithelization occurring to the edges  circumferentially. No o increased warmth, erythema or purulent drainage noted. Integumentary (Hair, Skin) Wound #5 status is Open. Original cause of wound was Blister. The date acquired was: 11/22/2021. The wound has been in treatment 2 weeks. The wound is located on the Right,Lateral Foot. The wound measures 0.8cm length x 1cm width x 0.1cm depth; 0.628cm^2 area and 0.063cm^3 volume. There is Fat Layer (Subcutaneous Tissue) exposed. There is no tunneling or undermining noted. There is a medium amount of serosanguineous drainage noted. The wound margin is distinct with the outline attached to the wound base. There is small (1-33%) red, pink granulation within the wound bed. There is a large (67-100%) amount of necrotic tissue within the wound bed including Adherent Slough. The periwound skin appearance exhibited: Callus. The periwound skin appearance did not exhibit: Crepitus, Excoriation, Induration, Rash, Scarring, Dry/Scaly, Maceration, Atrophie Blanche, Cyanosis, Ecchymosis, Hemosiderin Staining, Mottled, Pallor, Rubor, Erythema. Periwound temperature was noted as No Abnormality. MARKI, FREDE ([id number]) [id number] Page 7 of 9 Assessment Active Problems ICD-10 Non-pressure 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 Type 2 diabetes mellitus with foot ulcer Type 2 diabetes mellitus with diabetic polyneuropathy Patient's right lateral foot wound appears well-healing. I debrided nonviable tissue. I recommended continuing with Hydrofera Blue but adding Medihoney to the wound bed as well. Continue aggressive offloading. She has a surgical shoe and offloading foam pad. Complete course of oral antibiotics. Procedures Wound #5 Pre-procedure diagnosis of Wound #5 is a Diabetic Wound/Ulcer of the Lower Extremity located on the Right,Lateral Foot .Severity of Tissue Pre Debridement is: Fat layer exposed. There was a Excisional Skin/Subcutaneous  Tissue Debridement with a total area of 0.8 sq cm performed by Kalman Shan, DO. With the following instrument(s): Curette to remove Viable and Non-Viable tissue/material. Material removed includes Subcutaneous Tissue and Slough and after achieving pain control using Lidocaine. No specimens were taken. A time out was conducted at 11:20, prior to the start of the procedure. A Minimum amount of bleeding was controlled with Pressure. The procedure was tolerated well with a pain level of 0 throughout and a pain level of 0 following the procedure. Post Debridement Measurements: 0.8cm length x 1cm width x 0.1cm depth; 0.063cm^3 volume. Character of Wound/Ulcer Post Debridement is improved. Severity of Tissue Post Debridement is: Fat layer exposed. Post procedure Diagnosis Wound #5: Same as Pre-Procedure Plan Follow-up Appointments: Return Appointment in 1  week. - Monday w/ Dr. Heber Elmore and Allayne Butcher, Room 9 Bathing/ Shower/ Hygiene: May shower with protection but do not get wound dressing(s) wet. Edema Control - Lymphedema / SCD / Other: Elevate legs to the level of the heart or above for 30 minutes daily and/or when sitting, a frequency of: Avoid standing for long periods of time. WOUND #5: - Foot Wound Laterality: Right, Lateral Cleanser: Soap and Water 1 x Per Week/7 Days Discharge Instructions: May shower and wash wound with dial antibacterial soap and water prior to dressing change. Prim Dressing: Hydrofera Blue Ready Foam, 2.5 x2.5 in 1 x Per Week/7 Days ary Discharge Instructions: Apply to wound bed as instructed Prim Dressing: MediHoney Gel, tube 1.5 (oz) 1 x Per Week/7 Days ary Discharge Instructions: Apply to wound bed as instructed Secondary Dressing: Optifoam Non-Adhesive Dressing, 4x4 in 1 x Per Week/7 Days Discharge Instructions: Apply over primary dressing as directed. Secondary Dressing: Woven Gauze Sponge, Non-Sterile 4x4 in 1 x Per Week/7 Days Discharge Instructions: Apply over  primary dressing as directed. Secured With: 32M Medipore H Soft Cloth Surgical T ape, 4 x 10 (in/yd) 1 x Per Week/7 Days Discharge Instructions: Secure with tape as directed. 1. In office sharp debridement 2. Medihoney with Hydrofera Blue 3. Aggressive offloadingoosurgical shoe, donut foam pad Electronic Signature(s) Signed: 12/06/2021 9:25:17 AM By: Kalman Shan DO Entered By: Kalman Shan on 12/06/2021 11:34:14 -------------------------------------------------------------------------------- HxROS Details Patient Name: Date of Service: [name], [name] 12/06/2021 10:45 A M Medical Record Number: [id number] Patient Account Number: [id number] Date of Birth/Sex: Treating RN: 03-08-1955 (66 y.o. F) Primary Care Provider: [name] Other Clinician: Harold Hedge ([id number]) [id number] Page 8 of 9 Referring Provider: Treating Provider/Extender: [name] in Treatment: 9 Information Obtained From Patient Chart Eyes Medical History: Negative for: Cataracts; Glaucoma; Optic Neuritis Ear/Nose/Mouth/Throat Medical History: Negative for: Chronic sinus problems/congestion; Middle ear problems Hematologic/Lymphatic Medical History: Negative for: Anemia; Hemophilia; Human Immunodeficiency Virus; Lymphedema; Sickle Cell Disease Respiratory Medical History: Positive for: Sleep Apnea Negative for: Aspiration; Asthma; Chronic Obstructive Pulmonary Disease (COPD); Pneumothorax; Tuberculosis Cardiovascular Medical History: Positive for: Coronary Artery Disease; Hypertension Negative for: Angina; Arrhythmia; Congestive Heart Failure; Deep Vein Thrombosis; Hypotension; Myocardial Infarction; Peripheral Arterial Disease; Peripheral Venous Disease; Phlebitis; Vasculitis Past Medical History Notes: hypercholesterolemia Gastrointestinal Medical History: Negative for: Cirrhosis ; Colitis; Crohns; Hepatitis A;  Hepatitis B; Hepatitis C Endocrine Medical History: Positive for: Type II Diabetes Negative for: Type I Diabetes Genitourinary Medical History: Negative for: End Stage Renal Disease Immunological Medical History: Negative for: Lupus Erythematosus; Raynauds; Scleroderma Integumentary (Skin) Medical History: Negative for: History of Burn Musculoskeletal Medical History: Negative for: Gout; Rheumatoid Arthritis; Osteoarthritis; Osteomyelitis Neurologic Medical History: Positive for: Neuropathy Negative for: Dementia; Quadriplegia; Paraplegia; Seizure Disorder Immunizations Pneumococcal Vaccine: Received Pneumococcal Vaccination: Yes Received Pneumococcal Vaccination On or [address], GISH ([id number]) [phone].pdf Page 9 of 9 Tetanus Vaccine: Last tetanus shot: 12/17/2020 Implantable Devices No devices added Hospitalization / Surgery History Type of Hospitalization/Surgery 2nd and 3rd right toe amps total hysterectomy cholecystectomy 2 knee surgery's on left and 1 on right Family and Social History Unknown History: Yes; Current every day smoker - 1 pack/a/day; Alcohol Use: Never; Drug Use: No History; Caffeine Use: Moderate; Financial Concerns: No; Food, Clothing or Shelter Needs: No; Support System Lacking: No; Transportation Concerns: No Electronic Signature(s) Signed: 12/06/2021 9:25:17 AM By: Kalman Shan DO Entered By: Kalman Shan on 12/06/2021 11:31:55 -------------------------------------------------------------------------------- SuperBill Details Patient Name: Date of  Service: [name], [name] 12/06/2021 Medical Record Number: [id number] Patient Account Number: [id number] Date of Birth/Sex: Treating RN: 1955/03/11 (66 y.o. [name], [name] Primary Care Provider: [name] Other Clinician: Referring Provider: Treating Provider/Extender: [name]  in Treatment: 9 Diagnosis Coding ICD-10 Codes Code Description [phone] Non-pressure 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 E11.621 Type 2 diabetes mellitus with foot ulcer E11.42 Type 2 diabetes mellitus with diabetic polyneuropathy Facility Procedures : CPT4 Code: [id number] Description: 15830 - DEB SUBQ TISSUE 20 SQ CM/< ICD-10 Diagnosis Description L97.512 Non-pressure 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 Modifier: Quantity: 1 Physician Procedures : CPT4 Code Description Modifier [id number] - WC PHYS SUBQ TISS 20 SQ CM ICD-10 Diagnosis Description L97.512 Non-pressure 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 Quantity: 1 Electronic Signature(s) Signed: 12/06/2021 9:25:17 AM By: Kalman Shan DO Entered By: Kalman Shan on 12/06/2021 11:34:21

## 2021-12-13 ENCOUNTER — Encounter (HOSPITAL_BASED_OUTPATIENT_CLINIC_OR_DEPARTMENT_OTHER): Payer: Medicare Other | Admitting: Internal Medicine

## 2021-12-13 DIAGNOSIS — F17210 Nicotine dependence, cigarettes, uncomplicated: Secondary | ICD-10-CM | POA: Diagnosis not present

## 2021-12-13 DIAGNOSIS — L97512 Non-pressure 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: Secondary | ICD-10-CM | POA: Diagnosis not present

## 2021-12-13 DIAGNOSIS — E11621 Type 2 diabetes mellitus with foot ulcer: Secondary | ICD-10-CM | POA: Diagnosis not present

## 2021-12-13 DIAGNOSIS — E1142 Type 2 diabetes mellitus with diabetic polyneuropathy: Secondary | ICD-10-CM | POA: Diagnosis not present

## 2021-12-16 ENCOUNTER — Ambulatory Visit (INDEPENDENT_AMBULATORY_CARE_PROVIDER_SITE_OTHER): Payer: Medicare Other | Admitting: Licensed Clinical Social Worker

## 2021-12-16 DIAGNOSIS — F331 Major depressive disorder, recurrent, moderate: Secondary | ICD-10-CM

## 2021-12-16 NOTE — Progress Notes (Signed)
ADELYNE, MARCHESE ([id number]) [id number] Page 1 of 10 Visit Report for 12/13/2021 Chief Complaint Document Details Patient Name: Date of Service: [name], [name] 12/13/2021 11:30 [name] Medical Record Number: [id number] Patient Account Number: [id number] Date of Birth/Sex: Treating RN: 1955/09/02 (66 y.o. F) Primary Care Provider: [name] Other Clinician: Referring Provider: Treating Provider/Extender: [name] [name] Treatment: 10 Information Obtained from: Patient Chief Complaint 10/27; Right Plantar foot wound 11/21; 2 small areas of skin breakdown to the right foot following cast placement 8/29; right plantar foot wound Electronic Signature(s) Signed: 12/13/2021 3:32:17 PM By: Kalman Shan DO Entered By: Kalman Shan on 12/13/2021 12:39:37 -------------------------------------------------------------------------------- [name] Details Patient Name: Date of Service: [name], [name] 12/13/2021 11:30 [name] Medical Record Number: [id number] Patient Account Number: [id number] Date of Birth/Sex: Treating RN: 1955/05/17 (66 y.o. [name], [name] Primary Care Provider: [name] Other Clinician: Referring Provider: Treating Provider/Extender: [name] [name] Treatment: 10 [name] Performed for Assessment: Wound #5 Right,Lateral Foot Performed By: Physician Kalman Shan, DO [name] Type: [name] Severity of Tissue Pre [name]: Fat layer exposed Level of Consciousness (Pre-procedure): Awake and Alert Pre-procedure Verification/Time Out Yes - 11:51 Taken: Start Time: 11:51 Pain Control: Lidocaine T Area Debrided (L x W): otal 0.7 (cm) x 0.5 (cm) = 0.35 (cm) Tissue and other material debrided: Viable, Non-Viable, Slough, Subcutaneous, Slough Level: Skin/Subcutaneous Tissue [name] Description: Excisional Instrument:  Curette Bleeding: Minimum Hemostasis Achieved: Pressure End Time: 11:51 Procedural Pain: 0 Post Procedural Pain: 0 Response to Treatment: Procedure was tolerated well Level of Consciousness (Post- Awake and Alert procedure): Post [name] Measurements of Total Wound Length: (cm) 0.7 Width: (cm) 0.5 Depth: (cm) 0.2 Volume: (cm) 0.055 [name], [name] ([id number]) [id number] Page 2 of 10 Character of Wound/Ulcer Post [name]: Improved Severity of Tissue Post [name]: Fat layer exposed Post Procedure Diagnosis Same as Pre-procedure Electronic Signature(s) Signed: 12/13/2021 3:32:17 PM By: Kalman Shan DO Signed: 12/16/2021 4:44:37 PM By: Rhae Hammock RN Entered By: Rhae Hammock on 12/13/2021 11:52:30 -------------------------------------------------------------------------------- HPI Details Patient Name: Date of Service: [name], [name] 12/13/2021 11:30 [name] Medical Record Number: [id number] Patient Account Number: [id number] Date of Birth/Sex: Treating RN: [birth year] (66 y.o. F) Primary Care Provider: [name] Other Clinician: Referring Provider: Treating Provider/Extender: [name] [name] Treatment: 10 History of Present Illness HPI Description: Admission 12/17/2020 [name] [name] 66 year old female with [name] past medical history of insulin-dependent type 2 diabetes, hypothyroidism and daily1 pack per day cigarette smoker the presents to the clinic for [name] 6-week history of nonhealing wound to the right first MTPJ. She has been following with [name], podiatry for this issue. She has been using silver alginate with dressing changes. She uses [name] postsurgical shoe and offloading pads. She currently denies signs of infection. 10/31; patient presents for follow-up. She tolerated the soft cast fine although she states that she felt her foot rolling to one side. She denies  signs of infection. She would like to do the total contact cast today. 12/23/2020 upon evaluation today patient appears to be doing excellent [name] regard to her wound on the foot and she is [name] [name] total contact cast. I do think this is appropriate this is the first cast change which we are obliged to do to ensure nothing is rubbing everything appears to be doing quite well and very pleased [name] that regard. 11/7; patient presents  for follow-up. She had no issues with the cast. She denies signs of infection. 11/14; patient presents for 1 week follow-up. She has had no issues with the cast. She denies signs of infection. 11/21; patient presents for 1 week follow-up. She did develop 2 small areas of skin breakdown on either side of the right foot from the cast rubbing. She states she did not feel the wounds developing. She currently denies signs of infection. 11/28; patient presents for 1 week follow-up. She has no issues or complaints today. She denies pain or acute signs of infection. 12/12; patient presents for follow-up. She reports improvement to her right lateral foot wound. She has been using silver alginate to the area. She has no issues or complaints today. 12/19; patient presents for follow-up. She reports that her right lateral foot wound has healed. She has no issues or complaints today. 1/3; patient presents for follow-up. She has no issues or complaints today. She reports no open wounds. Readmission 10/01/2021 [name] [name] 66 year old female's with [name] past medical history of insulin-dependent controlled type 2 diabetes complicated by peripheral neuropathy that presents to the clinic for [name] 42-monthhistory of nonhealing ulcer to the right foot. I have seen her before for [name] wound to the same area that was treated and healed. Her wound today however this is much deeper and it has thick yellow drainage. She is not sure exactly how it started. She noticed it 1 day. She has been following  with Dr. EAmalia Haileyfor this issue. She had [name] wound culture that showed [name] mix of organisms on 09/01/2021. She was recently started on doxycycline. She is using the dConservation officer, nature She has been using Iodosorb with dressing changes. She currently denies systemic signs of infection. 8/21; patient presents for follow-up. She states she started and completed levofloxacin. She is still taking doxycycline. She reports improvement [name] drainage and odor. She has been doing Dakin's wet-to-dry dressings as well. She is using her front offloading shoe. She denies signs of infection. 8/29; patient presents for follow-up. She is still taking doxycycline. She continues to report improvement [name] drainage and reports no odor. She denies any purulent drainage. She has been doing silver alginate to the wound bed and using her front offloading shoe. 9/5; patient presents for follow-up. She completed the course of levofloxacin. She has someone that is able to drive her today so we can place the total contact cast. She reports no signs of infection. We have been doing silver alginate to the wound bed. 9/7; patient presents for follow-up. She has tolerated the total contact cast well. She presents for her obligatory cast change. She has no issues or complaints today. 9/11; patient presents for follow-up. She tolerated the total contact cast well. She has no issues or complaints today. We discussed potentially doing [name] skin substitute and patient would like to see if her insurance will cover this. 9/18; patient presents for follow-up. She had no issues with the total contact cast. She been approved for Grafix and patient would like to have this placed today. MADDALEE, KAVANAGH([id number] Page 3 of 10 9/25; patient presents for follow-up. Grafix #1 was placed [name] standard fashion at last clinic visit. She has no issues or complaints today. She tolerated the cast well. 10/2; patient presents  for follow-up. Grafix #2 was placed [name] standard fashion at last clinic visit. Unfortunately she developed [name] wound to the lateral aspect of the foot over the past week. She states she  has been on her feet [name] lot more and walking more. She denies systemic signs of infection. 10/9; patient presents for follow-up. Grafix #3 was placed [name] standard at last clinic visit. The plantar foot wound has healed with this. She developed [name] new wound to the lateral aspect last week and this has gotten larger. She reports soreness to this area. She denies systemic signs of infection. 10/16; patient presents for follow-up. Her plantar wound continues to remain closed. Her lateral wound has improved with the use of Hydrofera Blue. She has no issues or complaints today. She denies signs of infection. She has almost completed her course of antibiotics prescribed at last clinic visit. 10/23; patient presents for follow-up. She has been using Hydrofera Blue and Medihoney to the wound bed. She has no issues or complaints today. She has been using her surgical shoe. Electronic Signature(s) Signed: 12/13/2021 3:32:17 PM By: Kalman Shan DO Entered By: Kalman Shan on 12/13/2021 12:40:26 -------------------------------------------------------------------------------- Physical Exam Details Patient Name: Date of Service: [name], [name] 12/13/2021 11:30 [name] Medical Record Number: [id number] Patient Account Number: [id number] Date of Birth/Sex: Treating RN: 1955-10-07 (67 y.o. F) Primary Care Provider: [name] Other Clinician: Referring Provider: Treating Provider/Extender: [name] [name] Treatment: 10 Constitutional respirations regular, non-labored and within target range for patient.. Cardiovascular 2+ dorsalis pedis/posterior tibialis pulses. Psychiatric pleasant and cooperative. Notes T the lateral aspect of the right foot there is an open wound with  nonviable tissue and granulation tissue. No increased warmth, erythema or purulent drainage o noted. Electronic Signature(s) Signed: 12/13/2021 3:32:17 PM By: Kalman Shan DO Entered By: Kalman Shan on 12/13/2021 12:42:06 -------------------------------------------------------------------------------- Physician Orders Details Patient Name: Date of Service: [name], [name] 12/13/2021 11:30 [name] Medical Record Number: [id number] Patient Account Number: [id number] Date of Birth/Sex: Treating RN: 30-Oct-1955 (66 y.o. [name], [name] Primary Care Provider: [name] Other Clinician: Referring Provider: Treating Provider/Extender: [name] [name] Treatment: 10 Verbal / Phone Orders: No Diagnosis Coding ICD-10 Coding Code Description LAURANA, MAGISTRO ([id number]) [id number] Page 4 of 10 [phone] Non-pressure 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 E11.621 Type 2 diabetes mellitus with foot ulcer E11.42 Type 2 diabetes mellitus with diabetic polyneuropathy Follow-up Appointments ppointment [name] 1 week. - Monday w/ Dr. Heber  Return [name] Bathing/ Shower/ Hygiene May shower with protection but do not get wound dressing(s) wet. Edema Control - Lymphedema / SCD / Other Elevate legs to the level of the heart or above for 30 minutes daily and/or when sitting, [name] frequency of: Avoid standing for long periods of time. Wound Treatment Wound #5 - Foot Wound Laterality: Right, Lateral Cleanser: Soap and Water 1 x Per Week/7 Days Discharge Instructions: May shower and wash wound with dial antibacterial soap and water prior to dressing change. Prim Dressing: Hydrofera Blue Ready Foam, 2.5 x2.5 [name] 1 x Per Week/7 Days ary Discharge Instructions: Apply to wound bed as instructed Prim Dressing: MediHoney Gel, tube 1.5 (oz) 1 x Per Week/7 Days ary Discharge Instructions: Apply to wound bed as  instructed Secondary Dressing: Optifoam Non-Adhesive Dressing, 4x4 [name] 1 x Per Week/7 Days Discharge Instructions: Apply over primary dressing as directed. Secondary Dressing: Woven Gauze Sponge, Non-Sterile 4x4 [name] 1 x Per Week/7 Days Discharge Instructions: Apply over primary dressing as directed. Secured With: 41M Medipore H Soft Cloth Surgical T ape, 4 x 10 ([name]/yd) 1 x Per Week/7 Days  Discharge Instructions: Secure with tape as directed. Electronic Signature(s) Signed: 12/13/2021 3:32:17 PM By: Kalman Shan DO Entered By: Kalman Shan on 12/13/2021 12:44:32 -------------------------------------------------------------------------------- Problem List Details Patient Name: Date of Service: [name], [name] 12/13/2021 11:30 [name] Medical Record Number: [id number] Patient Account Number: [id number] Date of Birth/Sex: Treating RN: Jul 04, 1955 (65 y.o. [name] Primary Care Provider: [name] Other Clinician: Referring Provider: Treating Provider/Extender: [name] [name] Treatment: 10 Active Problems ICD-10 Encounter Code Description Active Date MDM Diagnosis L97.512 Non-pressure 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 10/01/2021 No Yes E11.621 Type 2 diabetes mellitus with foot ulcer 10/01/2021 No Yes E11.42 Type 2 diabetes mellitus with diabetic polyneuropathy 10/01/2021 No Yes [name], [name] ([id number]) [id number] Page 5 of 10 Inactive Problems Resolved Problems Electronic Signature(s) Signed: 12/13/2021 3:32:17 PM By: Kalman Shan DO Entered By: Kalman Shan on 12/13/2021 12:39:22 -------------------------------------------------------------------------------- Progress Note Details Patient Name: Date of Service: [name], [name] 12/13/2021 11:30 [name] Medical Record Number: [id number] Patient Account Number: [id number] Date of Birth/Sex: Treating  RN: 1955/07/28 (66 y.o. F) Primary Care Provider: [name] Other Clinician: Referring Provider: Treating Provider/Extender: [name] [name] Treatment: 10 Subjective Chief Complaint Information obtained from Patient 10/27; Right Plantar foot wound 11/21; 2 small areas of skin breakdown to the right foot following cast placement 8/29; right plantar foot wound History of Present Illness (HPI) Admission 12/17/2020 Ms. Hollynn Garno is [name] 66 year old female with [name] past medical history of insulin-dependent type 2 diabetes, hypothyroidism and daily1 pack per day cigarette smoker the presents to the clinic for [name] 6-week history of nonhealing wound to the right first MTPJ. She has been following with [name], podiatry for this issue. She has been using silver alginate with dressing changes. She uses [name] postsurgical shoe and offloading pads. She currently denies signs of infection. 10/31; patient presents for follow-up. She tolerated the soft cast fine although she states that she felt her foot rolling to one side. She denies signs of infection. She would like to do the total contact cast today. 12/23/2020 upon evaluation today patient appears to be doing excellent [name] regard to her wound on the foot and she is [name] [name] total contact cast. I do think this is appropriate this is the first cast change which we are obliged to do to ensure nothing is rubbing everything appears to be doing quite well and very pleased [name] that regard. 11/7; patient presents for follow-up. She had no issues with the cast. She denies signs of infection. 11/14; patient presents for 1 week follow-up. She has had no issues with the cast. She denies signs of infection. 11/21; patient presents for 1 week follow-up. She did develop 2 small areas of skin breakdown on either side of the right foot from the cast rubbing. She states she did not feel the wounds developing. She currently denies signs of  infection. 11/28; patient presents for 1 week follow-up. She has no issues or complaints today. She denies pain or acute signs of infection. 12/12; patient presents for follow-up. She reports improvement to her right lateral foot wound. She has been using silver alginate to the area. She has no issues or complaints today. 12/19; patient presents for follow-up. She reports that her right lateral foot wound has healed. She has no issues or complaints today. 1/3; patient presents for follow-up. She has no issues or complaints today.  She reports no open wounds. Readmission 10/01/2021 Ms. Aurea Aronov is [name] 66 year old female's with [name] past medical history of insulin-dependent controlled type 2 diabetes complicated by peripheral neuropathy that presents to the clinic for [name] 61-monthhistory of nonhealing ulcer to the right foot. I have seen her before for [name] wound to the same area that was treated and healed. Her wound today however this is much deeper and it has thick yellow drainage. She is not sure exactly how it started. She noticed it 1 day. She has been following with Dr. EAmalia Haileyfor this issue. She had [name] wound culture that showed [name] mix of organisms on 09/01/2021. She was recently started on doxycycline. She is using the dConservation officer, nature She has been using Iodosorb with dressing changes. She currently denies systemic signs of infection. 8/21; patient presents for follow-up. She states she started and completed levofloxacin. She is still taking doxycycline. She reports improvement [name] drainage and odor. She has been doing Dakin's wet-to-dry dressings as well. She is using her front offloading shoe. She denies signs of infection. 8/29; patient presents for follow-up. She is still taking doxycycline. She continues to report improvement [name] drainage and reports no odor. She denies any purulent drainage. She has been doing silver alginate to the wound bed and using her front offloading shoe. 9/5; patient presents  for follow-up. She completed the course of levofloxacin. She has someone that is able to drive her today so we can place the total contact cast. She reports no signs of infection. We have been doing silver alginate to the wound bed. 9/7; patient presents for follow-up. She has tolerated the total contact cast well. She presents for her obligatory cast change. She has no issues or complaints today. 9/11; patient presents for follow-up. She tolerated the total contact cast well. She has no issues or complaints today. We discussed potentially doing [name] skin substitute and patient would like to see if her insurance will cover this. MSAIA, DEROSSETT([id number] Page 6 of 10 9/18; patient presents for follow-up. She had no issues with the total contact cast. She been approved for Grafix and patient would like to have this placed today. 9/25; patient presents for follow-up. Grafix #1 was placed [name] standard fashion at last clinic visit. She has no issues or complaints today. She tolerated the cast well. 10/2; patient presents for follow-up. Grafix #2 was placed [name] standard fashion at last clinic visit. Unfortunately she developed [name] wound to the lateral aspect of the foot over the past week. She states she has been on her feet [name] lot more and walking more. She denies systemic signs of infection. 10/9; patient presents for follow-up. Grafix #3 was placed [name] standard at last clinic visit. The plantar foot wound has healed with this. She developed [name] new wound to the lateral aspect last week and this has gotten larger. She reports soreness to this area. She denies systemic signs of infection. 10/16; patient presents for follow-up. Her plantar wound continues to remain closed. Her lateral wound has improved with the use of Hydrofera Blue. She has no issues or complaints today. She denies signs of infection. She has almost completed her course of antibiotics prescribed at  last clinic visit. 10/23; patient presents for follow-up. She has been using Hydrofera Blue and Medihoney to the wound bed. She has no issues or complaints today. She has been using her surgical shoe. Patient History Information obtained from Patient, Chart. Family History Unknown History. Social History Current every  day smoker - 1 pack/[name]/day, Alcohol Use - Never, Drug Use - No History, Caffeine Use - Moderate. Medical History Eyes Denies history of Cataracts, Glaucoma, Optic Neuritis Ear/Nose/Mouth/Throat Denies history of Chronic sinus problems/congestion, Middle ear problems Hematologic/Lymphatic Denies history of Anemia, Hemophilia, Human Immunodeficiency Virus, Lymphedema, Sickle Cell Disease Respiratory Patient has history of Sleep Apnea Denies history of Aspiration, Asthma, Chronic Obstructive Pulmonary Disease (COPD), Pneumothorax, Tuberculosis Cardiovascular Patient has history of Coronary Artery Disease, Hypertension Denies history of Angina, Arrhythmia, Congestive Heart Failure, Deep Vein Thrombosis, Hypotension, Myocardial Infarction, Peripheral Arterial Disease, Peripheral Venous Disease, Phlebitis, Vasculitis Gastrointestinal Denies history of Cirrhosis , Colitis, Crohnoos, Hepatitis [name], Hepatitis B, Hepatitis C Endocrine Patient has history of Type II Diabetes Denies history of Type I Diabetes Genitourinary Denies history of End Stage Renal Disease Immunological Denies history of Lupus Erythematosus, Raynaudoos, Scleroderma Integumentary (Skin) Denies history of History of Burn Musculoskeletal Denies history of Gout, Rheumatoid Arthritis, Osteoarthritis, Osteomyelitis Neurologic Patient has history of Neuropathy Denies history of Dementia, Quadriplegia, Paraplegia, Seizure Disorder Hospitalization/Surgery History - 2nd and 3rd right toe amps. - total hysterectomy. - cholecystectomy. - 2 knee surgery's on left and 1 on right. Medical [name] Surgical History  Notes nd Cardiovascular hypercholesterolemia Objective Constitutional respirations regular, non-labored and within target range for patient.. Vitals Time Taken: 11:35 AM, Temperature: 97.7 F, Pulse: 97 bpm, Respiratory Rate: 18 breaths/min, Blood Pressure: 163/52 mmHg, Capillary Blood Glucose: 111 mg/dl. General Notes: glucose per pt report this am Cardiovascular 2+ dorsalis pedis/posterior tibialis pulses. Psychiatric pleasant and cooperative. ROSILYN, COACHMAN ([id number]) [id number] Page 7 of 10 General Notes: T the lateral aspect of the right foot there is an open wound with nonviable tissue and granulation tissue. No increased warmth, erythema or o purulent drainage noted. Integumentary (Hair, Skin) Wound #5 status is Open. Original cause of wound was Blister. The date acquired was: 11/22/2021. The wound has been [name] treatment 3 weeks. The wound is located on the Right,Lateral Foot. The wound measures 0.7cm length x 0.5cm width x 0.2cm depth; 0.275cm^2 area and 0.055cm^3 volume. There is Fat Layer (Subcutaneous Tissue) exposed. There is no tunneling noted, however, there is undermining starting at 12:00 and ending at 6:00 with [name] maximum distance of 0.2cm. There is [name] medium amount of serosanguineous drainage noted. The wound margin is distinct with the outline attached to the wound base. There is small (1-33%) pink, pale granulation within the wound bed. There is [name] large (67-100%) amount of necrotic tissue within the wound bed including Adherent Slough. The periwound skin appearance exhibited: Callus. The periwound skin appearance did not exhibit: Crepitus, Excoriation, Induration, Rash, Scarring, Dry/Scaly, Maceration, Atrophie Blanche, Cyanosis, Ecchymosis, Hemosiderin Staining, Mottled, Pallor, Rubor, Erythema. Periwound temperature was noted as No Abnormality. Assessment Active Problems ICD-10 Non-pressure chronic ulcer of other part of right foot  with fat layer exposed Type 2 diabetes mellitus with foot ulcer Type 2 diabetes mellitus with diabetic polyneuropathy Patient's wound has shown improvement [name] size and appearance since last clinic visit. I debrided nonviable tissue. I recommended continuing the course with Medihoney and Hydrofera Blue. Continue aggressive offloading. Follow-up [name] 1 week. Procedures Wound #5 Pre-procedure diagnosis of Wound #5 is [name] Diabetic Wound/Ulcer of the Lower Extremity located on the Right,Lateral Foot .Severity of Tissue Pre [name] is: Fat layer exposed. There was [name] Excisional Skin/Subcutaneous Tissue [name] with [name] total area of 0.35 sq cm performed by Kalman Shan, DO. With the following instrument(s): Curette to remove Viable and Non-Viable tissue/material. Material removed includes Subcutaneous Tissue and  Slough and after achieving pain control using Lidocaine. No specimens were taken. [name] time out was conducted at 11:51, prior to the start of the procedure. [name] Minimum amount of bleeding was controlled with Pressure. The procedure was tolerated well with [name] pain level of 0 throughout and [name] pain level of 0 following the procedure. Post [name] Measurements: 0.7cm length x 0.5cm width x 0.2cm depth; 0.055cm^3 volume. Character of Wound/Ulcer Post [name] is improved. Severity of Tissue Post [name] is: Fat layer exposed. Post procedure Diagnosis Wound #5: Same as Pre-Procedure Plan Follow-up Appointments: Return Appointment [name] 1 week. - Monday w/ Dr. [facility] Bathing/ Shower/ Hygiene: May shower with protection but do not get wound dressing(s) wet. Edema Control - Lymphedema / SCD / Other: Elevate legs to the level of the heart or above for 30 minutes daily and/or when sitting, [name] frequency of: Avoid standing for long periods of time. WOUND #5: - Foot Wound Laterality: Right, Lateral Cleanser: Soap and Water 1 x Per Week/7 Days Discharge Instructions: May shower and wash wound with  dial antibacterial soap and water prior to dressing change. Prim Dressing: Hydrofera Blue Ready Foam, 2.5 x2.5 [name] 1 x Per Week/7 Days ary Discharge Instructions: Apply to wound bed as instructed Prim Dressing: MediHoney Gel, tube 1.5 (oz) 1 x Per Week/7 Days ary Discharge Instructions: Apply to wound bed as instructed Secondary Dressing: Optifoam Non-Adhesive Dressing, 4x4 [name] 1 x Per Week/7 Days Discharge Instructions: Apply over primary dressing as directed. Secondary Dressing: Woven Gauze Sponge, Non-Sterile 4x4 [name] 1 x Per Week/7 Days Discharge Instructions: Apply over primary dressing as directed. Secured With: 28M Medipore H Soft Cloth Surgical T ape, 4 x 10 ([name]/yd) 1 x Per Week/7 Days Discharge Instructions: Secure with tape as directed. 1. [name] 2. Hydrofera Blue and Medihoney 3. Aggressive offloadingoosurgical shoe and foam donut Electronic Signature(s) Signed: 12/13/2021 3:32:17 PM By: Kalman Shan DO Entered By: Kalman Shan on 12/13/2021 12:48:06 Mathe, Dorita [name] ([id number]) [id number] Page 8 of 10 -------------------------------------------------------------------------------- HxROS Details Patient Name: Date of Service: [name], [name] 12/13/2021 11:30 [name] Medical Record Number: [id number] Patient Account Number: [id number] Date of Birth/Sex: Treating RN: [birth year] (66 y.o. F) Primary Care Provider: [name] Other Clinician: Referring Provider: Treating Provider/Extender: [name] [name] Treatment: 10 Information Obtained From Patient Chart Eyes Medical History: Negative for: Cataracts; Glaucoma; Optic Neuritis Ear/Nose/Mouth/Throat Medical History: Negative for: Chronic sinus problems/congestion; Middle ear problems Hematologic/Lymphatic Medical History: Negative for: Anemia; Hemophilia; Human Immunodeficiency Virus; Lymphedema; Sickle Cell  Disease Respiratory Medical History: Positive for: Sleep Apnea Negative for: Aspiration; Asthma; Chronic Obstructive Pulmonary Disease (COPD); Pneumothorax; Tuberculosis Cardiovascular Medical History: Positive for: Coronary Artery Disease; Hypertension Negative for: Angina; Arrhythmia; Congestive Heart Failure; Deep Vein Thrombosis; Hypotension; Myocardial Infarction; Peripheral Arterial Disease; Peripheral Venous Disease; Phlebitis; Vasculitis Past Medical History Notes: hypercholesterolemia Gastrointestinal Medical History: Negative for: Cirrhosis ; Colitis; Crohns; Hepatitis [name]; Hepatitis B; Hepatitis C Endocrine Medical History: Positive for: Type II Diabetes Negative for: Type I Diabetes Genitourinary Medical History: Negative for: End Stage Renal Disease Immunological Medical History: Negative for: Lupus Erythematosus; Raynauds; Scleroderma Integumentary (Skin) Medical History: Negative for: History of Burn Musculoskeletal DINAH, LUPA ([id number]) [id number] Page 9 of 10 Medical History: Negative for: Gout; Rheumatoid Arthritis; Osteoarthritis; Osteomyelitis Neurologic Medical History: Positive for: Neuropathy Negative for: Dementia; Quadriplegia; Paraplegia; Seizure Disorder Immunizations Pneumococcal Vaccine: Received Pneumococcal Vaccination: Yes Received Pneumococcal Vaccination On or After 60th Birthday: Yes Tetanus Vaccine: Last tetanus  shot: 12/17/2020 Implantable Devices No devices added Hospitalization / Surgery History Type of Hospitalization/Surgery 2nd and 3rd right toe amps total hysterectomy cholecystectomy 2 knee surgery's on left and 1 on right Family and Social History Unknown History: Yes; Current every day smoker - 1 pack/[name]/day; Alcohol Use: Never; Drug Use: No History; Caffeine Use: Moderate; Financial Concerns: No; Food, Clothing or Shelter Needs: No; Support System Lacking: No; Transportation Concerns:  No Electronic Signature(s) Signed: 12/13/2021 3:32:17 PM By: Kalman Shan DO Entered By: Kalman Shan on 12/13/2021 12:40:31 -------------------------------------------------------------------------------- SuperBill Details Patient Name: Date of Service: [name], [name] 12/13/2021 Medical Record Number: [id number] Patient Account Number: [id number] Date of Birth/Sex: Treating RN: 1955-04-18 (66 y.o. [name], [name] Primary Care Provider: [name] Other Clinician: Referring Provider: Treating Provider/Extender: [name] [name] Treatment: 10 Diagnosis Coding ICD-10 Codes Code Description [phone] Non-pressure 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 E11.621 Type 2 diabetes mellitus with foot ulcer E11.42 Type 2 diabetes mellitus with diabetic polyneuropathy Facility Procedures : 3 CPT4 Code: 9532023 Description: 34356 - DEB SUBQ TISSUE 20 SQ CM/< ICD-10 Diagnosis Description L97.512 Non-pressure 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 Modifier: Quantity: 1 Physician Procedures : CPT4 Code Description Modifier [id number] 29021 - WC PHYS SUBQ TISS 20 SQ CM ICD-10 Diagnosis Description L97.512 Non-pressure 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 Enriqueta Shutter, [place] ([id number])  [id number] P Quantity: 1 age 82 of 55 Electronic Signature(s) Signed: 12/13/2021 3:32:17 PM By: Kalman Shan DO Entered By: Kalman Shan on 12/13/2021 12:48:22

## 2021-12-16 NOTE — Progress Notes (Signed)
[name] ([id number]) [id number] Page 1 of 8 Visit Report for 12/13/2021 Arrival Information Details Patient Name: Date of Service: [name], [name] [name] 12/13/2021 11:30 [name] M Medical Record Number: [id number] Patient Account Number: [id number] Date of Birth/Sex: Treating [name]: 03/20/1955 (66 y.o. F) Primary Care [name]: [name] Other Clinician: Referring [name]: Treating [name]/Extender: [name] in Treatment: 10 Visit Information History Since Last Visit All ordered tests and consults were completed: No Patient Arrived: Ambulatory Added or deleted any medications: No Arrival Time: 11:37 Any new allergies or adverse reactions: No Transfer Assistance: None Had [name] fall or experienced change in No Patient Identification Verified: No activities of daily living that [name] affect Secondary Verification Process Completed: No risk of falls: Patient Requires Transmission-Based Precautions: No Signs or symptoms of abuse/neglect since last visito No Patient Has Alerts: No Hospitalized since last visit: No Implantable device outside of the clinic excluding No cellular tissue based products placed in the center since last visit: Pain Present Now: No Electronic Signature(s) Signed: 12/13/2021 11:49:21 AM By: Worthy Rancher Entered By: Worthy Rancher on 12/13/2021 11:37:31 -------------------------------------------------------------------------------- Encounter Discharge Information Details Patient Name: Date of Service: [name], [name] 12/13/2021 11:30 [name] M Medical Record Number: [id number] Patient Account Number: [id number] Date of Birth/Sex: Treating [name]: [birth year] (66 y.o. [name], [name] Primary Care [name]: [name] Other Clinician: Referring Tiphany Fayson: Treating [name]: [name] in Treatment: 10 Encounter Discharge Information Items  Post Procedure Vitals Discharge Condition: Stable Temperature (F): 98.4 Ambulatory Status: Ambulatory Pulse (bpm): 74 Discharge Destination: Home Respiratory Rate (breaths/min): 17 Transportation: Private Auto Blood Pressure (mmHg): 128/79 Accompanied By: self Schedule Follow-up Appointment: Yes Clinical Summary of Care: Patient Declined Electronic Signature(s) Signed: 12/16/2021 4:44:37 PM By: [name] Entered By: [name] Hammock on 12/13/2021 11:53:47 [name] ([id number]) [id number] Page 2 of 8 -------------------------------------------------------------------------------- Lower Extremity Assessment Details Patient Name: Date of Service: [name] [name] 12/13/2021 11:30 [name] M Medical Record Number: [id number] Patient Account Number: [id number] Date of Birth/Sex: Treating [name]: 22-May-1955 (66 y.o. [name] Primary Care Mancil Pfenning: [name] Other Clinician: Referring [name]: Treating Corry Storie/Extender: [name] in Treatment: 10 Edema Assessment Assessed: [name]: No] [Right: No] Edema: [Left: Ye] [Right: s] Calf Left: Right: Point of Measurement: From Medial Instep 46.1 cm Ankle Left: Right: Point of Measurement: From Medial Instep 24.4 cm Vascular Assessment Pulses: Dorsalis Pedis Palpable: [Right:Yes] Electronic Signature(s) Signed: 12/13/2021 5:16:26 PM By: Baruch Gouty [name], BSN Entered By: Baruch Gouty on 12/13/2021 11:42:06 -------------------------------------------------------------------------------- Multi Wound Chart Details Patient Name: Date of Service: [name], [name] 12/13/2021 11:30 [name] M Medical Record Number: [id number] Patient Account Number: [id number] Date of Birth/Sex: Treating [name]: 12-04-1955 (66 y.o. F) Primary Care Lillianah Swartzentruber: [name] Other Clinician: Referring Shemiah Rosch: Treating Grey Schlauch/Extender: [name] in Treatment: 10 Vital Signs Height(in): Capillary Blood Glucose(mg/dl): 111 Weight(lbs): Pulse(bpm): 55 Body Mass Index(BMI): Blood Pressure(mmHg): 163/52 Temperature(F): 97.7 Respiratory Rate(breaths/min): 18 [5:Photos:] [N/[name]:N/[name]] Right, Lateral Foot N/[name] N/[name] Wound Location: Blister N/[name] N/[name] Wounding Event: Diabetic Wound/Ulcer of the Lower N/[name] N/[name] Primary Etiology: Extremity Sleep Apnea, Coronary Artery N/[name] N/[name] Comorbid History: Disease, Hypertension, Type II Diabetes, Neuropathy 11/22/2021 N/[name] N/[name] Date Acquired: 3 N/[name] N/[name] of Treatment: Open N/[name] N/[name] Wound Status: No N/[name] N/[name] Wound Recurrence: 0.7x0.5x0.2 N/[name] N/[name] Measurements L x W x D (cm) 0.275 N/[name] N/[name] [name] (cm) :  rea 0.055 N/[name] N/[name] Volume (cm) : 84.40% N/[name] N/[name] % Reduction in [name] rea: 89.60% N/[name] N/[name] % Reduction in Volume: 12 Starting Position 1 (o'clock): 6 Ending Position 1 (o'clock): 0.2 Maximum Distance 1 (cm): Yes N/[name] N/[name] Undermining: Grade 2 N/[name] N/[name] Classification: Medium N/[name] N/[name] Exudate [name] mount: Serosanguineous N/[name] N/[name] Exudate Type: red, brown N/[name] N/[name] Exudate Color: Distinct, outline attached N/[name] N/[name] Wound Margin: Small (1-33%) N/[name] N/[name] Granulation [name] mount: Pink, Pale N/[name] N/[name] Granulation Quality: Large (67-100%) N/[name] N/[name] Necrotic [name] mount: Fat Layer (Subcutaneous Tissue): Yes N/[name] N/[name] Exposed Structures: Fascia: No Tendon: No Muscle: No Joint: No Bone: No Small (1-33%) N/[name] N/[name] Epithelialization: Debridement - Excisional N/[name] N/[name] Debridement: Pre-procedure Verification/Time Out 11:51 N/[name] N/[name] Taken: Lidocaine N/[name] N/[name] Pain Control: Subcutaneous, Slough N/[name] N/[name] Tissue Debrided: Skin/Subcutaneous Tissue N/[name] N/[name] Level: 0.35 N/[name] N/[name] Debridement [name] (sq cm): rea Curette N/[name] N/[name] Instrument: Minimum N/[name] N/[name] Bleeding: Pressure N/[name] N/[name] Hemostasis [name] chieved: 0 N/[name] N/[name] Procedural Pain: 0 N/[name] N/[name] Post Procedural Pain: Procedure was tolerated well  N/[name] N/[name] Debridement Treatment Response: 0.7x0.5x0.2 N/[name] N/[name] Post Debridement Measurements L x W x D (cm) 0.055 N/[name] N/[name] Post Debridement Volume: (cm) Callus: Yes N/[name] N/[name] Periwound Skin Texture: Excoriation: No Induration: No Crepitus: No Rash: No Scarring: No Maceration: No N/[name] N/[name] Periwound Skin Moisture: Dry/Scaly: No Atrophie Blanche: No N/[name] N/[name] Periwound Skin Color: Cyanosis: No Ecchymosis: No Erythema: No Hemosiderin Staining: No Mottled: No Pallor: No Rubor: No No Abnormality N/[name] N/[name] Temperature: Debridement N/[name] N/[name] Procedures Performed: Treatment Notes Wound #5 (Foot) Wound Laterality: Right, Lateral Cleanser Soap and Water Discharge Instruction: [name] shower and wash wound with dial antibacterial soap and water prior to dressing change. Peri-Wound Care Topical Primary Dressing Hydrofera Blue Ready Foam, 2.5 x2.5 in Discharge Instruction: Apply to wound bed as instructed [name], [name] ([id number]) [id number] Page 4 of 8 MediHoney Gel, tube 1.5 (oz) Discharge Instruction: Apply to wound bed as instructed Secondary Dressing Optifoam Non-Adhesive Dressing, 4x4 in Discharge Instruction: Apply over primary dressing as directed. Woven Gauze Sponge, Non-Sterile 4x4 in Discharge Instruction: Apply over primary dressing as directed. Secured With 28M Medipore H Soft Cloth Surgical T ape, 4 x 10 (in/yd) Discharge Instruction: Secure with tape as directed. Compression Wrap Compression Stockings Add-Ons Electronic Signature(s) Signed: 12/13/2021 3:32:17 PM By: Kalman Shan DO Entered By: Kalman Shan on 12/13/2021 12:39:27 -------------------------------------------------------------------------------- Multi-Disciplinary Care Plan Details Patient Name: Date of Service: [name], [name] 12/13/2021 11:30 [name] M Medical Record Number: [id number] Patient Account Number: [id number] Date of Birth/Sex: Treating [name]: 06/08/1955  (66 y.o. [name] Primary Care Antar Milks: [name] Other Clinician: Referring Keean Wilmeth: Treating Poppi Scantling/Extender: [name] in Treatment: 10 Active Inactive Wound/Skin Impairment Nursing Diagnoses: Impaired tissue integrity Knowledge deficit related to ulceration/compromised skin integrity Goals: Patient will have [name] decrease in wound volume by X% from date: (specify in notes) Date Initiated: 10/01/2021 Target Resolution Date: 12/18/2021 Goal Status: Active Patient/caregiver will verbalize understanding of skin care regimen Date Initiated: 10/01/2021 Target Resolution Date: 12/18/2021 Goal Status: Active Ulcer/skin breakdown will have [name] volume reduction of 30% by week 4 Date Initiated: 10/01/2021 Target Resolution Date: 12/18/2021 Goal Status: Active Interventions: Assess patient/caregiver ability to obtain necessary supplies Assess patient/caregiver ability to perform ulcer/skin care regimen upon admission and as needed Assess ulceration(s) every visit Notes: Electronic Signature(s) Signed: 12/13/2021 5:16:26 PM By: Baruch Gouty [name], BSN Entered By: Baruch Gouty on 12/13/2021 [name], [name] ([id number]) [id number]  Page 5 of 8 -------------------------------------------------------------------------------- Pain Assessment Details Patient Name: Date of Service: [name] [name] 12/13/2021 11:30 [name] M Medical Record Number: [id number] Patient Account Number: [id number] Date of Birth/Sex: Treating [name]: [birth year] (65 y.o. F) Primary Care Suzannah Bettes: [name] Other Clinician: Referring Torianne Laflam: Treating Sourish Allender/Extender: [name] in Treatment: 10 Active Problems Location of Pain Severity and Description of Pain Patient Has Paino No Site Locations Rate the pain. Current Pain Level: 0 Pain Management and Medication Current Pain  Management: Electronic Signature(s) Signed: 12/13/2021 5:16:26 PM By: Baruch Gouty [name], BSN Entered By: Baruch Gouty on 12/13/2021 11:41:33 -------------------------------------------------------------------------------- Patient/Caregiver Education Details Patient Name: Date of Service: [name], [name] 10/23/2023andnbsp11:30 [name] M Medical Record Number: [id number] Patient Account Number: [id number] Date of Birth/Gender: Treating [name]: 06-Dec-1955 (66 y.o. [name] Primary Care Physician: [name] Other Clinician: Referring Physician: Treating Physician/Extender: [name] in Treatment: 10 Education Assessment Education Provided To: Patient Education Topics Provided Elevated Blood Sugar/ Impact on Healing: Methods: Explain/Verbal Responses: Reinforcements needed, State content correctly [name], [name] ([id number]) [id number] Page 6 of 8 Wound/Skin Impairment: Methods: Explain/Verbal Responses: Reinforcements needed, State content correctly Electronic Signature(s) Signed: 12/13/2021 5:16:26 PM By: Baruch Gouty [name], BSN Entered By: Baruch Gouty on 12/13/2021 11:46:33 -------------------------------------------------------------------------------- Wound Assessment Details Patient Name: Date of Service: [name], [name] 12/13/2021 11:30 [name] M Medical Record Number: [id number] Patient Account Number: [id number] Date of Birth/Sex: Treating [name]: 04-13-1955 (66 y.o. [name] Primary Care Quantarius Genrich: [name] Other Clinician: Referring Oretta Berkland: Treating Jammie Clink/Extender: [name] in Treatment: 10 Wound Status Wound Number: 5 Primary Diabetic Wound/Ulcer of the Lower Extremity Etiology: Wound Location: Right, Lateral Foot Wound Open Wounding Event: Blister Status: Date Acquired: 11/22/2021 Comorbid Sleep Apnea, Coronary Artery  Disease, Hypertension, Type II [name] Of Treatment: 3 History: Diabetes, Neuropathy Clustered Wound: No Photos Wound Measurements Length: (cm) 0.7 Width: (cm) 0.5 Depth: (cm) 0.2 Area: (cm) 0.275 Volume: (cm) 0.055 % Reduction in Area: 84.4% % Reduction in Volume: 89.6% Epithelialization: Small (1-33%) Tunneling: No Undermining: Yes Starting Position (o'clock): 12 Ending Position (o'clock): 6 Maximum Distance: (cm) 0.2 Wound Description Classification: Grade 2 Wound Margin: Distinct, outline attached Exudate Amount: Medium Exudate Type: Serosanguineous Exudate Color: red, brown Foul Odor After Cleansing: No Slough/Fibrino Yes Wound Bed Granulation Amount: Small (1-33%) Exposed Structure Granulation Quality: Pink, Pale Fascia Exposed: No Necrotic Amount: Large (67-100%) Fat Layer (Subcutaneous Tissue) Exposed: Yes Necrotic Quality: Adherent Slough Tendon Exposed: No Muscle Exposed: No [name], [name] ([id number]) [id number] Page 7 of 8 Joint Exposed: No Bone Exposed: No Periwound Skin Texture Texture Color No Abnormalities Noted: No No Abnormalities Noted: No Callus: Yes Atrophie Blanche: No Crepitus: No Cyanosis: No Excoriation: No Ecchymosis: No Induration: No Erythema: No Rash: No Hemosiderin Staining: No Scarring: No Mottled: No Pallor: No Moisture Rubor: No No Abnormalities Noted: No Dry / Scaly: No Temperature / Pain Maceration: No Temperature: No Abnormality Treatment Notes Wound #5 (Foot) Wound Laterality: Right, Lateral Cleanser Soap and Water Discharge Instruction: [name] shower and wash wound with dial antibacterial soap and water prior to dressing change. Peri-Wound Care Topical Primary Dressing Hydrofera Blue Ready Foam, 2.5 x2.5 in Discharge Instruction: Apply to wound bed as instructed MediHoney Gel, tube 1.5 (oz) Discharge Instruction: Apply to wound bed as instructed Secondary Dressing Optifoam  Non-Adhesive Dressing, 4x4 in Discharge Instruction: Apply over primary dressing as directed. Woven Gauze Sponge,  Non-Sterile 4x4 in Discharge Instruction: Apply over primary dressing as directed. Secured With 51M Medipore H Soft Cloth Surgical T ape, 4 x 10 (in/yd) Discharge Instruction: Secure with tape as directed. Compression Wrap Compression Stockings Add-Ons Electronic Signature(s) Signed: 12/13/2021 11:49:21 AM By: Worthy Rancher Signed: 12/13/2021 5:16:26 PM By: Baruch Gouty [name], BSN Entered By: Worthy Rancher on 12/13/2021 11:47:30 -------------------------------------------------------------------------------- Vitals Details Patient Name: Date of Service: [name], [name] 12/13/2021 11:30 [name] M Medical Record Number: [id number] Patient Account Number: [id number] Date of Birth/Sex: Treating [name]: March 20, 1955 (66 y.o. F) Primary Care Carter Kassel: [name] Other Clinician: Referring Maxx Calaway: Treating Zarya Lasseigne/Extender: [name] in Treatment: [employer] [name], [name] ([id number]) [id number] Page 8 of 8 Time Taken: 11:35 Temperature (F): 97.7 Pulse (bpm): 97 Respiratory Rate (breaths/min): 18 Blood Pressure (mmHg): 163/52 Capillary Blood Glucose (mg/dl): 111 Reference Range: 80 - 120 mg / dl Notes glucose per pt report this am Electronic Signature(s) Signed: 12/13/2021 5:16:26 PM By: Baruch Gouty [name], BSN Entered By: Baruch Gouty on 12/13/2021 11:41:24

## 2021-12-17 ENCOUNTER — Ambulatory Visit (HOSPITAL_COMMUNITY): Payer: Medicare Other | Admitting: Licensed Clinical Social Worker

## 2021-12-17 NOTE — Progress Notes (Signed)
  THERAPIST PROGRESS NOTE  Session Time: 1015-11a  [place] in office visit for patient and LCSW clinician  Participation Level: Active  Behavioral Response: NAAlertDepressed  Type of Therapy: Individual Therapy  Treatment Goals addressed: Problem: Depression Goal:  Decrease depressive symptoms and improve levels of effective functioning-pt reports a decrease in overall depression symptoms 3 out of 5 sessions documented.  Outcome: Progressing Goal: Develop healthy thinking patterns and beliefs about self, others, and the world that lead to the alleviation and help prevent the relapse of depression per self report 3 out of 5 sessions documented.   Outcome: Progressing Intervention: Encourage verbalization of feelings/concerns/expectations Note: Encouraged--pt explored/expressed  Intervention: Encourage compliance with prescribed medication regimen Note: Reviewed  Intervention: REVIEW PLEASE SKILLS (TREAT PHYSICAL ILLNESS, BALANCE EATING, AVOID MOOD-ALTERING SUBSTANCES, BALANCE SLEEP AND GET EXERCISE) WITH Jeany Note: Reviewed    ProgressTowards Goals: Progressing  Interventions: CBT, Supportive, and Reframing  Summary: [name] is a 66 y.o. female who presents with improving symptoms associated with depression diagnosis.   Allowed pt to explore and express thoughts and feelings associated with recent life situations and external stressors. Discussed recent weight loss due to drug montjaro. Discussed overall diabetes self management behaviors. Pt feels she is doing a better job of keeping everything in control. Discussed eating/cooking choices. Pt reports that she is continuing to do chair yoga.   Discussed relationship w/ husband, and caregiver duties she is performing for her sister.   Reviewed depression and anxiety management strategies. Pt reflects cooperation and understanding.   Continued recommendations are as follows: self care  behaviors, positive social engagements, focusing on overall work/home/life balance, and focusing on positive physical and emotional wellness.   Suicidal/Homicidal: No  Therapist Response: Pt is continuing to apply interventions learned in session into daily life situations. Pt is currently on track to meet goals utilizing interventions mentioned above. Personal growth and progress noted. Treatment to continue as indicated.   Plan: Return again in 8 weeks.  Diagnosis:  Encounter Diagnosis  Name Primary?   MDD (major depressive disorder), recurrent episode, moderate ([place]) Yes    Collaboration of Care: Other pt to continue follow ups with PCP Dr. [facility]  Patient/Guardian was advised Release of Information must be obtained prior to any record release in order to collaborate their care with an outside provider. Patient/Guardian was advised if they have not already done so to contact the registration department to sign all necessary forms in order for [place] to release information regarding their care.   Consent: Patient/Guardian gives verbal consent for treatment and assignment of benefits for services provided during this visit. Patient/Guardian expressed understanding and agreed to proceed.   [place], LCSW 12/17/2021

## 2021-12-17 NOTE — Plan of Care (Signed)
  Problem: Depression Goal:  Decrease depressive symptoms and improve levels of effective functioning-pt reports a decrease in overall depression symptoms 3 out of 5 sessions documented.  Outcome: Progressing Goal: Develop healthy thinking patterns and beliefs about self, others, and the world that lead to the alleviation and help prevent the relapse of depression per self report 3 out of 5 sessions documented.   Outcome: Progressing Intervention: Encourage verbalization of feelings/concerns/expectations Note: Encouraged--pt explored/expressed  Intervention: Encourage compliance with prescribed medication regimen Note: Reviewed  Intervention: REVIEW PLEASE SKILLS (TREAT PHYSICAL ILLNESS, BALANCE EATING, AVOID MOOD-ALTERING SUBSTANCES, BALANCE SLEEP AND GET EXERCISE) WITH Jood Note: Reviewed

## 2021-12-20 ENCOUNTER — Encounter (HOSPITAL_BASED_OUTPATIENT_CLINIC_OR_DEPARTMENT_OTHER): Payer: Medicare Other | Admitting: Internal Medicine

## 2021-12-20 DIAGNOSIS — L97512 Non-pressure 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: Secondary | ICD-10-CM

## 2021-12-20 DIAGNOSIS — E11621 Type 2 diabetes mellitus with foot ulcer: Secondary | ICD-10-CM

## 2021-12-20 DIAGNOSIS — F17210 Nicotine dependence, cigarettes, uncomplicated: Secondary | ICD-10-CM | POA: Diagnosis not present

## 2021-12-20 DIAGNOSIS — E1142 Type 2 diabetes mellitus with diabetic polyneuropathy: Secondary | ICD-10-CM | POA: Diagnosis not present

## 2021-12-30 ENCOUNTER — Ambulatory Visit (INDEPENDENT_AMBULATORY_CARE_PROVIDER_SITE_OTHER): Payer: Medicare Other | Admitting: Licensed Clinical Social Worker

## 2021-12-30 ENCOUNTER — Encounter (HOSPITAL_BASED_OUTPATIENT_CLINIC_OR_DEPARTMENT_OTHER): Payer: Medicare Other | Attending: Internal Medicine | Admitting: Internal Medicine

## 2021-12-30 DIAGNOSIS — E11621 Type 2 diabetes mellitus with foot ulcer: Secondary | ICD-10-CM | POA: Insufficient documentation

## 2021-12-30 DIAGNOSIS — L97512 Non-pressure 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: Secondary | ICD-10-CM | POA: Diagnosis not present

## 2021-12-30 DIAGNOSIS — Z794 Long term (current) use of insulin: Secondary | ICD-10-CM | POA: Insufficient documentation

## 2021-12-30 DIAGNOSIS — E1142 Type 2 diabetes mellitus with diabetic polyneuropathy: Secondary | ICD-10-CM | POA: Diagnosis not present

## 2021-12-30 DIAGNOSIS — F17210 Nicotine dependence, cigarettes, uncomplicated: Secondary | ICD-10-CM | POA: Diagnosis not present

## 2021-12-30 DIAGNOSIS — F331 Major depressive disorder, recurrent, moderate: Secondary | ICD-10-CM

## 2021-12-30 NOTE — Progress Notes (Signed)
THERAPIST PROGRESS NOTE  Session Time: [place] in office visit for patient and LCSW clinician  Pt provided verbal consent for LCSW counselor trainee, [name] , to be present throughout session.   Participation Level: Active  Behavioral Response: NAAlertDepressed  Type of Therapy: Individual Therapy  Treatment Goals addressed: Problem: Depression Goal:  Decrease depressive symptoms and improve levels of effective functioning-pt reports a decrease in overall depression symptoms 3 out of 5 sessions documented.  Outcome: Progressing Goal: Develop healthy thinking patterns and beliefs about self, others, and the world that lead to the alleviation and help prevent the relapse of depression per self report 3 out of 5 sessions documented.   Outcome: Progressing Intervention: Encourage verbalization of feelings/concerns/expectations Note: Encouraged--pt explored/expressed  Intervention: Encourage compliance with prescribed medication regimen Note: Reviewed  Intervention: REVIEW PLEASE SKILLS (TREAT PHYSICAL ILLNESS, BALANCE EATING, AVOID MOOD-ALTERING SUBSTANCES, BALANCE SLEEP AND GET EXERCISE) WITH [name] Note: Reviewed    ProgressTowards Goals: Progressing  Interventions: CBT, Supportive, and Reframing  Summary: [name] is a 66 y.o. female who presents with improving symptoms associated with depression diagnosis.   Allowed pt to explore and express thoughts and feelings associated with recent life situations and external stressors. Patient reports that she is continuing to grieve over losing her husband's favorite dog. Patient reports that everywhere she looks in the house she is reminded of this dog--he was 40 years old. Patient reports that she is trying hard to support her husband, as she is aware that he is grieving as well.  Discussed relationship with several family members--patient reports that she is continuing to be  supportive of her sister [name] taking her out shopping, and out to eat. Allow patient to explore her thoughts and feelings associated with the continued estrangement between herself and her niece, [name] Patient reports that she still doesn't understand what happened with this relationship, and why her niece has blocked her phone calls and any other ways patient has of getting in touch with her. Patient reports that the holidays season is very triggering for her--she misses her sister, [name], and misses other lost family members. Encouraged patient to focus on self-care, and engaging in activities that she finds pleasurable and pleasing to her period patient reports that she enjoys cooking, baking, and going to yard sales, Garment/textile technologist.  Continued recommendations are as follows: self care behaviors, positive social engagements, focusing on overall work/home/life balance, and focusing on positive physical and emotional wellness.   Suicidal/Homicidal: No  Therapist Response: Pt is continuing to apply interventions learned in session into daily life situations. Pt is currently on track to meet goals utilizing interventions mentioned above. Personal growth and progress noted. Treatment to continue as indicated.   Plan: Return again in 4 weeks.  Diagnosis:  Encounter Diagnosis  Name Primary?   MDD (major depressive disorder), recurrent episode, moderate ([place]) Yes   Collaboration of Care: Other pt to continue follow ups with PCP Dr[name]  Patient/Guardian was advised Release of Information must be obtained prior to any record release in order to collaborate their care with an outside provider. Patient/Guardian was advised if they have not already done so to contact the registration department to sign all necessary forms in order for [place] to release information regarding their care.   Consent: Patient/Guardian gives verbal consent for treatment and assignment of  benefits for services provided during this visit. Patient/Guardian expressed understanding and agreed to proceed.   [name] R [name] Rentz, LCSW  12/30/2021  

## 2021-12-31 NOTE — Plan of Care (Signed)
  Problem: Depression Goal:  Decrease depressive symptoms and improve levels of effective functioning-pt reports a decrease in overall depression symptoms 3 out of 5 sessions documented.  Outcome: Progressing Goal: Develop healthy thinking patterns and beliefs about self, others, and the world that lead to the alleviation and help prevent the relapse of depression per self report 3 out of 5 sessions documented.   Outcome: Progressing

## 2022-01-03 NOTE — Progress Notes (Signed)
[name] ([id number]) [id number] Page 1 of 10 Visit Report for 12/30/2021 Chief Complaint Document Details Patient Name: Date of Service: [name], [name] 12/30/2021 2:15 PM Medical Record Number: [id number] Patient Account Number: [id number] Date of Birth/Sex: Treating RN: [birth year] (66 y.o. F) Primary Care Provider: Geoffery [name] Other Clinician: Referring Provider: Treating Provider/Extender: [name] in Treatment: 12 Information Obtained from: Patient Chief Complaint 10/27; Right Plantar foot wound 11/21; 2 small areas of skin breakdown to the right foot following cast placement 8/29; right plantar foot wound Electronic Signature(s) Signed: 12/31/2021 1:24:23 PM By: [name] Shan DO Entered By: [name] on 12/31/2021 13:18:24 -------------------------------------------------------------------------------- Debridement Details Patient Name: Date of Service: [name] 12/30/2021 2:15 PM Medical Record Number: [id number] Patient Account Number: [id number] Date of Birth/Sex: Treating RN: 1956-01-01 (66 y.o. [name], [name] Primary Care Provider: Geoffery [name] Other Clinician: Referring Provider: Treating Provider/Extender: [name] in Treatment: 12 Debridement Performed for Assessment: Wound #5 Right,Lateral Foot Performed By: Physician [name] Shan, DO Debridement Type: Debridement Severity of Tissue Pre Debridement: Fat layer exposed Level of Consciousness (Pre-procedure): Awake and Alert Pre-procedure Verification/Time Out Yes - 15:15 Taken: Start Time: 15:15 Pain Control: Lidocaine T Area Debrided (L x W): otal 0.5 (cm) x 0.5 (cm) = 0.25 (cm) Tissue and other material debrided: Viable, Non-Viable, Slough, Subcutaneous, Slough Level: Skin/Subcutaneous Tissue Debridement Description: Excisional Instrument:  Curette Bleeding: Minimum Hemostasis Achieved: Pressure End Time: 15:15 Procedural Pain: 0 Post Procedural Pain: 0 Response to Treatment: Procedure was tolerated well Level of Consciousness (Post- Awake and Alert procedure): Post Debridement Measurements of Total Wound Length: (cm) 0.5 Width: (cm) 0.5 Depth: (cm) 0.1 Volume: (cm) 0.02 [name], [name] ([id number]) [id number] Page 2 of 10 Character of Wound/Ulcer Post Debridement: Improved Severity of Tissue Post Debridement: Fat layer exposed Post Procedure Diagnosis Same as Pre-procedure Electronic Signature(s) Signed: 12/30/2021 4:44:16 PM By: [name] Shan DO Signed: 01/03/2022 4:10:29 PM By: [name] Hammock RN Entered By: [name] on 12/30/2021 15:16:30 -------------------------------------------------------------------------------- HPI Details Patient Name: Date of Service: [name] 12/30/2021 2:15 PM Medical Record Number: [id number] Patient Account Number: [id number] Date of Birth/Sex: Treating RN: 09-28-1955 (66 y.o. F) Primary Care Provider: Geoffery [name] Other Clinician: Referring Provider: Treating Provider/Extender: [name] in Treatment: 12 History of Present Illness HPI Description: Admission 12/17/2020 [name] [name] 66 year old female with [name] past medical history of insulin-dependent type 2 diabetes, hypothyroidism and daily1 pack per day cigarette smoker the presents to the clinic for [name] 6-week history of nonhealing wound to the right first MTPJ. She has been following with [name], podiatry for this issue. She has been using silver alginate with dressing changes. She uses [name] postsurgical shoe and offloading pads. She currently denies signs of infection. 10/31; patient presents for follow-up. She tolerated the soft cast fine although she states that she felt her foot rolling to one side. She denies signs  of infection. She would like to do the total contact cast today. 12/23/2020 upon evaluation today patient appears to be doing excellent in regard to her wound on the foot and she is in [name] total contact cast. I do think this is appropriate this is the first cast change which we are obliged to do to ensure nothing is rubbing everything appears to be doing quite well and very pleased in that regard. 11/7; patient presents for follow-up. She  had no issues with the cast. She denies signs of infection. 11/14; patient presents for 1 week follow-up. She has had no issues with the cast. She denies signs of infection. 11/21; patient presents for 1 week follow-up. She did develop 2 small areas of skin breakdown on either side of the right foot from the cast rubbing. She states she did not feel the wounds developing. She currently denies signs of infection. 11/28; patient presents for 1 week follow-up. She has no issues or complaints today. She denies pain or acute signs of infection. 12/12; patient presents for follow-up. She reports improvement to her right lateral foot wound. She has been using silver alginate to the area. She has no issues or complaints today. 12/19; patient presents for follow-up. She reports that her right lateral foot wound has healed. She has no issues or complaints today. 1/3; patient presents for follow-up. She has no issues or complaints today. She reports no open wounds. Readmission 10/01/2021 [name] [name] 66 year old female's with [name] past medical history of insulin-dependent controlled type 2 diabetes complicated by peripheral neuropathy that presents to the clinic for [name] 1-monthhistory of nonhealing ulcer to the right foot. I have seen her before for [name] wound to the same area that was treated and healed. Her wound today however this is much deeper and it has thick yellow drainage. She is not sure exactly how it started. She noticed it 1 day. She has been following with Dr.  EAmalia Haileyfor this issue. She had [name] wound culture that showed [name] mix of organisms on 09/01/2021. She was recently started on doxycycline. She is using the dConservation officer, nature She has been using Iodosorb with dressing changes. She currently denies systemic signs of infection. 8/21; patient presents for follow-up. She states she started and completed levofloxacin. She is still taking doxycycline. She reports improvement in drainage and odor. She has been doing Dakin's wet-to-dry dressings as well. She is using her front offloading shoe. She denies signs of infection. 8/29; patient presents for follow-up. She is still taking doxycycline. She continues to report improvement in drainage and reports no odor. She denies any purulent drainage. She has been doing silver alginate to the wound bed and using her front offloading shoe. 9/5; patient presents for follow-up. She completed the course of levofloxacin. She has someone that is able to drive her today so we can place the total contact cast. She reports no signs of infection. We have been doing silver alginate to the wound bed. 9/7; patient presents for follow-up. She has tolerated the total contact cast well. She presents for her obligatory cast change. She has no issues or complaints today. 9/11; patient presents for follow-up. She tolerated the total contact cast well. She has no issues or complaints today. We discussed potentially doing [name] skin substitute and patient would like to see if her insurance will cover this. 9/18; patient presents for follow-up. She had no issues with the total contact cast. She been approved for Grafix and patient would like to have this placed today. [name], [name]([id number] Page 3 of 10 9/25; patient presents for follow-up. Grafix #1 was placed in standard fashion at last clinic visit. She has no issues or complaints today. She tolerated the cast well. 10/2; patient presents for  follow-up. Grafix #2 was placed in standard fashion at last clinic visit. Unfortunately she developed [name] wound to the lateral aspect of the foot over the past week. She states she has been on  her feet [name] lot more and walking more. She denies systemic signs of infection. 10/9; patient presents for follow-up. Grafix #3 was placed in standard at last clinic visit. The plantar foot wound has healed with this. She developed [name] new wound to the lateral aspect last week and this has gotten larger. She reports soreness to this area. She denies systemic signs of infection. 10/16; patient presents for follow-up. Her plantar wound continues to remain closed. Her lateral wound has improved with the use of Hydrofera Blue. She has no issues or complaints today. She denies signs of infection. She has almost completed her course of antibiotics prescribed at last clinic visit. 10/23; patient presents for follow-up. She has been using Hydrofera Blue and Medihoney to the wound bed. She has no issues or complaints today. She has been using her surgical shoe. 10/30; patient presents for follow-up. We have been using Hydrofera Blue and Medihoney to the wound bed. Patient has no issues or complaints today. 11/10; patient presents for follow-up. She has been using Hydrofera Blue and Medihoney without issues. She has been wearing her Orthotics. It is unclear if she is offloading this area or not. Electronic Signature(s) Signed: 12/31/2021 1:24:23 PM By: [name] Shan DO Entered By: [name] on 12/31/2021 13:19:16 -------------------------------------------------------------------------------- Physical Exam Details Patient Name: Date of Service: [name], [name] 12/30/2021 2:15 PM Medical Record Number: [id number] Patient Account Number: [id number] Date of Birth/Sex: Treating RN: 1955-08-25 (66 y.o. F) Primary Care Provider: Geoffery [name] Other Clinician: Referring Provider: Treating  Provider/Extender: [name] in Treatment: 12 Constitutional respirations regular, non-labored and within target range for patient.. Cardiovascular 2+ dorsalis pedis/posterior tibialis pulses. Psychiatric pleasant and cooperative. Notes T the lateral aspect of the right foot there is an open wound with nonviable tissue and granulation tissue. No increased warmth, erythema or purulent drainage o noted. Electronic Signature(s) Signed: 12/31/2021 1:24:23 PM By: [name] Shan DO Entered By: [name] on 12/31/2021 13:19:40 -------------------------------------------------------------------------------- Physician Orders Details Patient Name: Date of Service: [name], [name] 12/30/2021 2:15 PM Medical Record Number: [id number] Patient Account Number: [id number] Date of Birth/Sex: Treating RN: May 03, 1955 (66 y.o. [name], [name] Primary Care Provider: Geoffery [name] Other Clinician: Referring Provider: Treating Provider/Extender: [name] in Treatment: 12 Verbal / Phone Orders: Virl Axe, Virl Diamond ([id number]) [id number] Page 4 of 10 Diagnosis Coding Follow-up Appointments ppointment in 1 week. - w/ Dr. Heber Gardiner Return [name] Cellular or Tissue Based Products Cellular or Tissue Based Product Type: - Run IVR for grafix 12/30/21 order Grafix Bathing/ Shower/ Hygiene May shower with protection but do not get wound dressing(s) wet. Edema Control - Lymphedema / SCD / Other Elevate legs to the level of the heart or above for 30 minutes daily and/or when sitting, [name] frequency of: Avoid standing for long periods of time. Wound Treatment Wound #5 - Foot Wound Laterality: Right, Lateral Cleanser: Soap and Water 1 x Per Week/7 Days Discharge Instructions: May shower and wash wound with dial antibacterial soap and water prior to dressing change. Prim Dressing: Hydrofera Blue  Ready Foam, 2.5 x2.5 in 1 x Per Week/7 Days ary Discharge Instructions: Apply to wound bed as instructed Prim Dressing: MediHoney Gel, tube 1.5 (oz) 1 x Per Week/7 Days ary Discharge Instructions: Apply to wound bed as instructed Secondary Dressing: Optifoam Non-Adhesive Dressing, 4x4 in 1 x Per Week/7 Days Discharge Instructions: Apply over primary dressing as directed. Secondary Dressing: Woven Gauze Sponge,  Non-Sterile 4x4 in 1 x Per Week/7 Days Discharge Instructions: Apply over primary dressing as directed. Secured With: 44M Medipore H Soft Cloth Surgical T ape, 4 x 10 (in/yd) 1 x Per Week/7 Days Discharge Instructions: Secure with tape as directed. Electronic Signature(s) Signed: 12/31/2021 1:24:23 PM By: [name] Shan DO Previous Signature: 12/30/2021 4:44:16 PM Version By: [name] Shan DO Entered By: [name] on 12/31/2021 13:19:50 -------------------------------------------------------------------------------- Problem List Details Patient Name: Date of Service: [name], [name] 12/30/2021 2:15 PM Medical Record Number: [id number] Patient Account Number: [id number] Date of Birth/Sex: Treating RN: 1955/05/28 (66 y.o. F) Primary Care Provider: Geoffery [name] Other Clinician: Referring Provider: Treating Provider/Extender: [name] in Treatment: 12 Active Problems ICD-10 Encounter Code Description Active Date MDM Diagnosis L97.512 Non-pressure 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 10/01/2021 No Yes E11.621 Type 2 diabetes mellitus with foot ulcer 10/01/2021 No Yes E11.42 Type 2 diabetes mellitus with diabetic polyneuropathy 10/01/2021 No Yes Jalea, Bronaugh Kylin [name] ([id number]) [id number] Page 5 of 10 Inactive Problems Resolved Problems Electronic Signature(s) Signed: 12/31/2021 1:24:23 PM By: [name] Shan DO Entered By: [name] on 12/31/2021  13:18:12 -------------------------------------------------------------------------------- Progress Note Details Patient Name: Date of Service: [name], [name] 12/30/2021 2:15 PM Medical Record Number: [id number] Patient Account Number: [id number] Date of Birth/Sex: Treating RN: [birth year] (66 y.o. F) Primary Care Provider: Geoffery [name] Other Clinician: Referring Provider: Treating Provider/Extender: [name] in Treatment: 12 Subjective Chief Complaint Information obtained from Patient 10/27; Right Plantar foot wound 11/21; 2 small areas of skin breakdown to the right foot following cast placement 8/29; right plantar foot wound History of Present Illness (HPI) Admission 12/17/2020 [name] [name] 66 year old female with [name] past medical history of insulin-dependent type 2 diabetes, hypothyroidism and daily1 pack per day cigarette smoker the presents to the clinic for [name] 6-week history of nonhealing wound to the right first MTPJ. She has been following with [name], podiatry for this issue. She has been using silver alginate with dressing changes. She uses [name] postsurgical shoe and offloading pads. She currently denies signs of infection. 10/31; patient presents for follow-up. She tolerated the soft cast fine although she states that she felt her foot rolling to one side. She denies signs of infection. She would like to do the total contact cast today. 12/23/2020 upon evaluation today patient appears to be doing excellent in regard to her wound on the foot and she is in [name] total contact cast. I do think this is appropriate this is the first cast change which we are obliged to do to ensure nothing is rubbing everything appears to be doing quite well and very pleased in that regard. 11/7; patient presents for follow-up. She had no issues with the cast. She denies signs of infection. 11/14; patient presents for 1 week follow-up. She has had no  issues with the cast. She denies signs of infection. 11/21; patient presents for 1 week follow-up. She did develop 2 small areas of skin breakdown on either side of the right foot from the cast rubbing. She states she did not feel the wounds developing. She currently denies signs of infection. 11/28; patient presents for 1 week follow-up. She has no issues or complaints today. She denies pain or acute signs of infection. 12/12; patient presents for follow-up. She reports improvement to her right lateral foot wound. She has been using silver alginate to the area.  She has no issues or complaints today. 12/19; patient presents for follow-up. She reports that her right lateral foot wound has healed. She has no issues or complaints today. 1/3; patient presents for follow-up. She has no issues or complaints today. She reports no open wounds. Readmission 10/01/2021 [name] [name] 66 year old female's with [name] past medical history of insulin-dependent controlled type 2 diabetes complicated by peripheral neuropathy that presents to the clinic for [name] 82-monthhistory of nonhealing ulcer to the right foot. I have seen her before for [name] wound to the same area that was treated and healed. Her wound today however this is much deeper and it has thick yellow drainage. She is not sure exactly how it started. She noticed it 1 day. She has been following with Dr. EAmalia Haileyfor this issue. She had [name] wound culture that showed [name] mix of organisms on 09/01/2021. She was recently started on doxycycline. She is using the dConservation officer, nature She has been using Iodosorb with dressing changes. She currently denies systemic signs of infection. 8/21; patient presents for follow-up. She states she started and completed levofloxacin. She is still taking doxycycline. She reports improvement in drainage and odor. She has been doing Dakin's wet-to-dry dressings as well. She is using her front offloading shoe. She denies signs of  infection. 8/29; patient presents for follow-up. She is still taking doxycycline. She continues to report improvement in drainage and reports no odor. She denies any purulent drainage. She has been doing silver alginate to the wound bed and using her front offloading shoe. 9/5; patient presents for follow-up. She completed the course of levofloxacin. She has someone that is able to drive her today so we can place the total contact cast. She reports no signs of infection. We have been doing silver alginate to the wound bed. 9/7; patient presents for follow-up. She has tolerated the total contact cast well. She presents for her obligatory cast change. She has no issues or complaints today. 9/11; patient presents for follow-up. She tolerated the total contact cast well. She has no issues or complaints today. We discussed potentially doing [name] skin MDSOUZA Ileah [name] ([id number] Page 6 of 10 substitute and patient would like to see if her insurance will cover this. 9/18; patient presents for follow-up. She had no issues with the total contact cast. She been approved for Grafix and patient would like to have this placed today. 9/25; patient presents for follow-up. Grafix #1 was placed in standard fashion at last clinic visit. She has no issues or complaints today. She tolerated the cast well. 10/2; patient presents for follow-up. Grafix #2 was placed in standard fashion at last clinic visit. Unfortunately she developed [name] wound to the lateral aspect of the foot over the past week. She states she has been on her feet [name] lot more and walking more. She denies systemic signs of infection. 10/9; patient presents for follow-up. Grafix #3 was placed in standard at last clinic visit. The plantar foot wound has healed with this. She developed [name] new wound to the lateral aspect last week and this has gotten larger. She reports soreness to this area. She denies systemic signs of  infection. 10/16; patient presents for follow-up. Her plantar wound continues to remain closed. Her lateral wound has improved with the use of Hydrofera Blue. She has no issues or complaints today. She denies signs of infection. She has almost completed her course of antibiotics prescribed at last clinic visit. 10/23; patient presents for follow-up.  She has been using Hydrofera Blue and Medihoney to the wound bed. She has no issues or complaints today. She has been using her surgical shoe. 10/30; patient presents for follow-up. We have been using Hydrofera Blue and Medihoney to the wound bed. Patient has no issues or complaints today. 11/10; patient presents for follow-up. She has been using Hydrofera Blue and Medihoney without issues. She has been wearing her Orthotics. It is unclear if she is offloading this area or not. Patient History Information obtained from Patient, Chart. Family History Unknown History. Social History Current every day smoker - 1 pack/[name]/day, Alcohol Use - Never, Drug Use - No History, Caffeine Use - Moderate. Medical History Eyes Denies history of Cataracts, Glaucoma, Optic Neuritis Ear/Nose/Mouth/Throat Denies history of Chronic sinus problems/congestion, Middle ear problems Hematologic/Lymphatic Denies history of Anemia, Hemophilia, Human Immunodeficiency Virus, Lymphedema, Sickle Cell Disease Respiratory Patient has history of Sleep Apnea Denies history of Aspiration, Asthma, Chronic Obstructive Pulmonary Disease (COPD), Pneumothorax, Tuberculosis Cardiovascular Patient has history of Coronary Artery Disease, Hypertension Denies history of Angina, Arrhythmia, Congestive Heart Failure, Deep Vein Thrombosis, Hypotension, Myocardial Infarction, Peripheral Arterial Disease, Peripheral Venous Disease, Phlebitis, Vasculitis Gastrointestinal Denies history of Cirrhosis , Colitis, Crohnoos, Hepatitis [name], Hepatitis B, Hepatitis C Endocrine Patient has history of Type  II Diabetes Denies history of Type I Diabetes Genitourinary Denies history of End Stage Renal Disease Immunological Denies history of Lupus Erythematosus, Raynaudoos, Scleroderma Integumentary (Skin) Denies history of History of Burn Musculoskeletal Denies history of Gout, Rheumatoid Arthritis, Osteoarthritis, Osteomyelitis Neurologic Patient has history of Neuropathy Denies history of Dementia, Quadriplegia, Paraplegia, Seizure Disorder Hospitalization/Surgery History - 2nd and 3rd right toe amps. - total hysterectomy. - cholecystectomy. - 2 knee surgery's on left and 1 on right. Medical [name] Surgical History Notes nd Cardiovascular hypercholesterolemia Objective Constitutional respirations regular, non-labored and within target range for patient.. Vitals Time Taken: 2:30 PM, Temperature: 97.8 F, Pulse: 83 bpm, Respiratory Rate: 18 breaths/min, Blood Pressure: 133/77 mmHg, Capillary Blood Glucose: 124 mg/dl. [name], [name] ([id number]) [id number] Page 7 of 10 Cardiovascular 2+ dorsalis pedis/posterior tibialis pulses. Psychiatric pleasant and cooperative. General Notes: T the lateral aspect of the right foot there is an open wound with nonviable tissue and granulation tissue. No increased warmth, erythema or o purulent drainage noted. Integumentary (Hair, Skin) Wound #5 status is Open. Original cause of wound was Blister. The date acquired was: 11/22/2021. The wound has been in treatment 5 weeks. The wound is located on the Right,Lateral Foot. The wound measures 0.1cm length x 0.1cm width x 0.1cm depth; 0.008cm^2 area and 0.001cm^3 volume. There is Fat Layer (Subcutaneous Tissue) exposed. There is no tunneling or undermining noted. There is [name] medium amount of serosanguineous drainage noted. The wound margin is distinct with the outline attached to the wound base. There is small (1-33%) pink, pale granulation within the wound bed. There is [name] large  (67-100%) amount of necrotic tissue within the wound bed including Eschar. The periwound skin appearance had no abnormalities noted for color. The periwound skin appearance exhibited: Callus. The periwound skin appearance did not exhibit: Crepitus, Excoriation, Induration, Rash, Scarring, Dry/Scaly, Maceration. Periwound temperature was noted as No Abnormality. Assessment Active Problems ICD-10 Non-pressure 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 Type 2 diabetes mellitus with foot ulcer Type 2 diabetes mellitus with diabetic polyneuropathy Patient's right lateral foot wound appears well-healing. I debrided nonviable tissue. She has been approved for Grafix and we will order this and place this at next clinic  visit. Continue Medihoney and Hydrofera Blue for now. Continue aggressive offloading. Procedures Wound #5 Pre-procedure diagnosis of Wound #5 is [name] Diabetic Wound/Ulcer of the Lower Extremity located on the Right,Lateral Foot .Severity of Tissue Pre Debridement is: Fat layer exposed. There was [name] Excisional Skin/Subcutaneous Tissue Debridement with [name] total area of 0.25 sq cm performed by [name] Shan, DO. With the following instrument(s): Curette to remove Viable and Non-Viable tissue/material. Material removed includes Subcutaneous Tissue and Slough and after achieving pain control using Lidocaine. No specimens were taken. [name] time out was conducted at 15:15, prior to the start of the procedure. [name] Minimum amount of bleeding was controlled with Pressure. The procedure was tolerated well with [name] pain level of 0 throughout and [name] pain level of 0 following the procedure. Post Debridement Measurements: 0.5cm length x 0.5cm width x 0.1cm depth; 0.02cm^3 volume. Character of Wound/Ulcer Post Debridement is improved. Severity of Tissue Post Debridement is: Fat layer exposed. Post procedure Diagnosis Wound #5: Same as Pre-Procedure Plan Follow-up Appointments: Return  Appointment in 1 week. - w/ Dr. Heber [place] Cellular or Tissue Based Products: Cellular or Tissue Based Product Type: - Run IVR for grafix 12/30/21 order Grafix Bathing/ Shower/ Hygiene: May shower with protection but do not get wound dressing(s) wet. Edema Control - Lymphedema / SCD / Other: Elevate legs to the level of the heart or above for 30 minutes daily and/or when sitting, [name] frequency of: Avoid standing for long periods of time. WOUND #5: - Foot Wound Laterality: Right, Lateral Cleanser: Soap and Water 1 x Per Week/7 Days Discharge Instructions: May shower and wash wound with dial antibacterial soap and water prior to dressing change. Prim Dressing: Hydrofera Blue Ready Foam, 2.5 x2.5 in 1 x Per Week/7 Days ary Discharge Instructions: Apply to wound bed as instructed Prim Dressing: MediHoney Gel, tube 1.5 (oz) 1 x Per Week/7 Days ary Discharge Instructions: Apply to wound bed as instructed Secondary Dressing: Optifoam Non-Adhesive Dressing, 4x4 in 1 x Per Week/7 Days Discharge Instructions: Apply over primary dressing as directed. Secondary Dressing: Woven Gauze Sponge, Non-Sterile 4x4 in 1 x Per Week/7 Days Discharge Instructions: Apply over primary dressing as directed. Secured With: 79M Medipore H Soft Cloth Surgical T ape, 4 x 10 (in/yd) 1 x Per Week/7 Days Discharge Instructions: Secure with tape as directed. 1. In office sharp debridement 2. Medihoney and Hydrofera Blue 3. Aggressive offloadingoooffloading pad and surgical shoe 4. Follow-up in 1 week [name], [name] ([id number]) [id number] Page 8 of 10 Electronic Signature(s) Signed: 12/31/2021 1:24:23 PM By: [name] Shan DO Entered By: [name] on 12/31/2021 13:21:02 -------------------------------------------------------------------------------- HxROS Details Patient Name: Date of Service: [place], [name] 12/30/2021 2:15 PM Medical Record Number: [id number] Patient  Account Number: [id number] Date of Birth/Sex: Treating RN: [birth year] (66 y.o. F) Primary Care Provider: Geoffery [name] Other Clinician: Referring Provider: Treating Provider/Extender: [name] in Treatment: 12 Information Obtained From Patient Chart Eyes Medical History: Negative for: Cataracts; Glaucoma; Optic Neuritis Ear/Nose/Mouth/Throat Medical History: Negative for: Chronic sinus problems/congestion; Middle ear problems Hematologic/Lymphatic Medical History: Negative for: Anemia; Hemophilia; Human Immunodeficiency Virus; Lymphedema; Sickle Cell Disease Respiratory Medical History: Positive for: Sleep Apnea Negative for: Aspiration; Asthma; Chronic Obstructive Pulmonary Disease (COPD); Pneumothorax; Tuberculosis Cardiovascular Medical History: Positive for: Coronary Artery Disease; Hypertension Negative for: Angina; Arrhythmia; Congestive Heart Failure; Deep Vein Thrombosis; Hypotension; Myocardial Infarction; Peripheral Arterial Disease; Peripheral Venous Disease; Phlebitis; Vasculitis Past Medical History Notes: hypercholesterolemia Gastrointestinal Medical History: Negative for: Cirrhosis ;  Colitis; Crohns; Hepatitis [name]; Hepatitis B; Hepatitis C Endocrine Medical History: Positive for: Type II Diabetes Negative for: Type I Diabetes Genitourinary Medical History: Negative for: End Stage Renal Disease Immunological Medical History: Negative for: Lupus Erythematosus; Raynauds; Scleroderma [name], [name] ([id number]) [id number] Page 9 of 10 Integumentary (Skin) Medical History: Negative for: History of Burn Musculoskeletal Medical History: Negative for: Gout; Rheumatoid Arthritis; Osteoarthritis; Osteomyelitis Neurologic Medical History: Positive for: Neuropathy Negative for: Dementia; Quadriplegia; Paraplegia; Seizure Disorder Immunizations Pneumococcal Vaccine: Received Pneumococcal  Vaccination: Yes Received Pneumococcal Vaccination On or After 60th Birthday: Yes Tetanus Vaccine: Last tetanus shot: 12/17/2020 Implantable Devices No devices added Hospitalization / Surgery History Type of Hospitalization/Surgery 2nd and 3rd right toe amps total hysterectomy cholecystectomy 2 knee surgery's on left and 1 on right Family and Social History Unknown History: Yes; Current every day smoker - 1 pack/[name]/day; Alcohol Use: Never; Drug Use: No History; Caffeine Use: Moderate; Financial Concerns: No; Food, Clothing or Shelter Needs: No; Support System Lacking: No; Transportation Concerns: No Electronic Signature(s) Signed: 12/31/2021 1:24:23 PM By: [name] Shan DO Entered By: [name] on 12/31/2021 13:19:23 -------------------------------------------------------------------------------- SuperBill Details Patient Name: Date of Service: [name], [name] 12/30/2021 Medical Record Number: [id number] Patient Account Number: [id number] Date of Birth/Sex: Treating RN: [birth year] (66 y.o. [name], [name] Primary Care Provider: Geoffery [name] Other Clinician: Referring Provider: Treating Provider/Extender: [name] in Treatment: 12 Diagnosis Coding ICD-10 Codes Code Description [phone] Non-pressure 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 E11.621 Type 2 diabetes mellitus with foot ulcer E11.42 Type 2 diabetes mellitus with diabetic polyneuropathy Facility Procedures : [name], [name] Code: [phone] [place] [name] [phone] Description: 11042 - DEB SUBQ TISSUE 20 SQ CM/< ICD-10 Diagnosis Description L97.512 Non-pressure 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 E11.621 Type 2 diabetes mellitus with foot ulcer 729) [id number] Modifier: [id number] Quantity: 1 47.pdf Page 10 of 10 Physician Procedures : CPT4 Code Description Modifier [id number] 11042 - WC PHYS SUBQ TISS 20  SQ CM ICD-10 Diagnosis Description L97.512 Non-pressure 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 E11.621 Type 2 diabetes mellitus with foot ulcer Quantity: 1 Electronic Signature(s) Signed: 12/31/2021 1:24:23 PM By: [name] Shan DO Previous Signature: 12/30/2021 4:44:16 PM Version By: [name] Shan DO Entered By: [name] on 12/31/2021 13:21:12

## 2022-01-03 NOTE — Progress Notes (Signed)
ZADIE, DEEMER ([id number]) [id number] Page 1 of 7 Visit Report for 12/30/2021 Arrival Information Details Patient Name: Date of Service: [name], [name] 12/30/2021 2:15 PM Medical Record Number: [id number] Patient Account Number: [id number] Date of Birth/Sex: Treating RN: 1955-11-05 (66 y.o. [name], [name] Primary Care [name]: [name] Other Clinician: Referring [name]: Treating [name] in Treatment: 12 Visit Information History Since Last Visit Added or deleted any medications: No Patient Arrived: Ambulatory Any new allergies or adverse reactions: No Arrival Time: 14:30 Had [name] fall or experienced change in No Accompanied By: self activities of daily living that may affect Transfer Assistance: None risk of falls: Patient Identification Verified: Yes Signs or symptoms of abuse/neglect since last visito No Secondary Verification Process Completed: Yes Hospitalized since last visit: No Patient Requires Transmission-Based Precautions: No Implantable device outside of the clinic excluding No Patient Has Alerts: No cellular tissue based products placed in the center since last visit: Has Dressing in Place as Prescribed: Yes Pain Present Now: No Electronic Signature(s) Signed: 12/30/2021 3:49:20 PM By: [name] Entered By: [name] on 12/30/2021 14:30:23 -------------------------------------------------------------------------------- Encounter Discharge Information Details Patient Name: Date of Service: [name], [name] 12/30/2021 2:15 PM Medical Record Number: [id number] Patient Account Number: [id number] Date of Birth/Sex: Treating RN: 1955-11-16 (66 y.o. [name], [name] Primary Care Zaniyah Wernette: [name] Other Clinician: Referring [name]: Treating Janasha Barkalow/Extender: [name] in Treatment:  12 Encounter Discharge Information Items Discharge Condition: Stable Ambulatory Status: Ambulatory Discharge Destination: Home Transportation: Private Auto Accompanied By: self Schedule Follow-up Appointment: Yes Clinical Summary of Care: Patient Declined Electronic Signature(s) Signed: 01/03/2022 4:10:29 PM By: [name] Hammock RN Entered By: [name] on 12/30/2021 14:57:42 [name], [name] ([id number]) [id number] Page 2 of 7 -------------------------------------------------------------------------------- Lower Extremity Assessment Details Patient Name: Date of Service: [name], [name] 12/30/2021 2:15 PM Medical Record Number: [id number] Patient Account Number: [id number] Date of Birth/Sex: Treating RN: 04/07/1955 (66 y.o. [name] Primary Care Chioke Noxon: [name] Other Clinician: Referring [name]: Treating [name]/Extender: [name] Weeks in Treatment: 12 Edema Assessment Assessed: [name]: No] [Right: No] Edema: [Left: Ye] [Right: s] Calf Left: Right: Point of Measurement: From Medial Instep 45 cm Ankle Left: Right: Point of Measurement: From Medial Instep 24 cm Electronic Signature(s) Signed: 12/30/2021 3:49:20 PM By: [name] Entered By: [name] on 12/30/2021 14:32:56 -------------------------------------------------------------------------------- Multi Wound Chart Details Patient Name: Date of Service: [name], [name] 12/30/2021 2:15 PM Medical Record Number: [id number] Patient Account Number: [id number] Date of Birth/Sex: Treating RN: 10/25/1955 (66 y.o. F) Primary Care [name]: [name] Other Clinician: Referring [name]: Treating [name]/Extender: [name] in Treatment: 12 Vital Signs Height(in): Capillary Blood Glucose(mg/dl): 124 Weight(lbs): Pulse(bpm): 20 Body Mass Index(BMI): Blood  Pressure(mmHg): 133/77 Temperature(F): 97.8 Respiratory Rate(breaths/min): 18 [5:Photos:] [N/[name]:N/[name]] Right, Lateral Foot N/[name] N/[name] Wound Location: Blister N/[name] N/[name] Wounding Event: Diabetic Wound/Ulcer of the Lower N/[name] N/[name] Primary Etiology: Extremity Sleep Apnea, Coronary Artery N/[name] N/[name] Comorbid History: Disease, Hypertension, Type II [name], [name] ([id number]) [id number] Page 3 of 7 Diabetes, Neuropathy 11/22/2021 N/[name] N/[name] Date Acquired: 5 N/[name] N/[name] Weeks of Treatment: Open N/[name] N/[name] Wound Status: No N/[name] N/[name] Wound Recurrence: 0.1x0.1x0.1 N/[name] N/[name] Measurements L x W x D (cm) 0.008 N/[name] N/[name] [name] (cm) : rea 0.001 N/[name] N/[name] Volume (cm) : 99.50% N/[name] N/[name] % Reduction in [name] rea: 99.80%  N/[name] N/[name] % Reduction in Volume: Grade 2 N/[name] N/[name] Classification: Medium N/[name] N/[name] Exudate [name] mount: Serosanguineous N/[name] N/[name] Exudate Type: red, brown N/[name] N/[name] Exudate Color: Distinct, outline attached N/[name] N/[name] Wound Margin: Small (1-33%) N/[name] N/[name] Granulation [name] mount: Pink, Pale N/[name] N/[name] Granulation Quality: Large (67-100%) N/[name] N/[name] Necrotic [name] mount: Eschar N/[name] N/[name] Necrotic Tissue: Fat Layer (Subcutaneous Tissue): Yes N/[name] N/[name] Exposed Structures: Fascia: No Tendon: No Muscle: No Joint: No Bone: No Large (67-100%) N/[name] N/[name] Epithelialization: Debridement - Excisional N/[name] N/[name] Debridement: Pre-procedure Verification/Time Out 15:15 N/[name] N/[name] Taken: Lidocaine N/[name] N/[name] Pain Control: Subcutaneous, Slough N/[name] N/[name] Tissue Debrided: Skin/Subcutaneous Tissue N/[name] N/[name] Level: 0.25 N/[name] N/[name] Debridement [name] (sq cm): rea Curette N/[name] N/[name] Instrument: Minimum N/[name] N/[name] Bleeding: Pressure N/[name] N/[name] Hemostasis [name] chieved: 0 N/[name] N/[name] Procedural Pain: 0 N/[name] N/[name] Post Procedural Pain: Procedure was tolerated well N/[name] N/[name] Debridement Treatment Response: 0.5x0.5x0.1 N/[name] N/[name] Post Debridement Measurements L x W x D (cm) 0.02 N/[name] N/[name] Post Debridement Volume: (cm) Callus: Yes N/[name]  N/[name] Periwound Skin Texture: Excoriation: No Induration: No Crepitus: No Rash: No Scarring: No Maceration: No N/[name] N/[name] Periwound Skin Moisture: Dry/Scaly: No Atrophie Blanche: No N/[name] N/[name] Periwound Skin Color: Cyanosis: No Ecchymosis: No Erythema: No Hemosiderin Staining: No Mottled: No Pallor: No Rubor: No No Abnormality N/[name] N/[name] Temperature: Debridement N/[name] N/[name] Procedures Performed: Treatment Notes Wound #5 (Foot) Wound Laterality: Right, Lateral Cleanser Soap and Water Discharge Instruction: May shower and wash wound with dial antibacterial soap and water prior to dressing change. Peri-Wound Care Topical Primary Dressing Hydrofera Blue Ready Foam, 2.5 x2.5 in Discharge Instruction: Apply to wound bed as instructed MediHoney Gel, tube 1.5 (oz) Discharge Instruction: Apply to wound bed as instructed Secondary Dressing Optifoam Non-Adhesive Dressing, 4x4 in Discharge Instruction: Apply over primary dressing as directed. Woven Gauze Sponge, Non-Sterile 4x4 in [name], [name] ([id number]) [id number] Page 4 of 7 Discharge Instruction: Apply over primary dressing as directed. Secured With 59M Medipore H Soft Cloth Surgical T ape, 4 x 10 (in/yd) Discharge Instruction: Secure with tape as directed. Compression Wrap Compression Stockings Add-Ons Electronic Signature(s) Signed: 12/31/2021 1:24:23 PM By: Kalman Shan DO Entered By: Kalman Shan on 12/31/2021 13:18:16 -------------------------------------------------------------------------------- Multi-Disciplinary Care Plan Details Patient Name: Date of Service: [name], [name] 12/30/2021 2:15 PM Medical Record Number: [id number] Patient Account Number: [id number] Date of Birth/Sex: Treating RN: [birth year] (66 y.o. [name], [name] Primary Care Raymone Pembroke: [name] Other Clinician: Referring Talea Manges: Treating Agapito Hanway/Extender: [name] in Treatment: 12 Active Inactive Wound/Skin Impairment Nursing Diagnoses: Impaired tissue integrity Knowledge deficit related to ulceration/compromised skin integrity Goals: Patient will have [name] decrease in wound volume by X% from date: (specify in notes) Date Initiated: 10/01/2021 Target Resolution Date: 01/22/2022 Goal Status: Active Patient/caregiver will verbalize understanding of skin care regimen Date Initiated: 10/01/2021 Target Resolution Date: 01/22/2022 Goal Status: Active Ulcer/skin breakdown will have [name] volume reduction of 30% by week 4 Date Initiated: 10/01/2021 Target Resolution Date: 01/21/2022 Goal Status: Active Interventions: Assess patient/caregiver ability to obtain necessary supplies Assess patient/caregiver ability to perform ulcer/skin care regimen upon admission and as needed Assess ulceration(s) every visit Notes: Electronic Signature(s) Signed: 01/03/2022 4:10:29 PM By: [name] Hammock RN Entered By: [name] on 12/30/2021 15:17:45 [name], [name] ([id number]) [id number] Page 5 of 7 -------------------------------------------------------------------------------- Pain Assessment Details Patient Name: Date of Service: [name], [name] 12/30/2021 2:15 PM Medical Record Number: [id number] Patient Account Number: [id number] Date of  Birth/Sex: Treating RN: 20-Feb-1956 (66 y.o. [name] Primary Care Liandra Mendia: [name] Other Clinician: Referring Morine Kohlman: Treating Ronnel Zuercher/Extender: [name] in Treatment: 12 Active Problems Location of Pain Severity and Description of Pain Patient Has Paino No Site Locations Rate the pain. Current Pain Level: 0 Pain Management and Medication Current Pain Management: Electronic Signature(s) Signed: 12/30/2021 3:49:20 PM By: [name] Entered By: [name] on 12/30/2021  14:32:39 -------------------------------------------------------------------------------- Patient/Caregiver Education Details Patient Name: Date of Service: [name], [address] 11/9/2023andnbsp2:15 PM Medical Record Number: [id number] Patient Account Number: [id number] Date of Birth/Gender: Treating RN: 1955/05/10 (66 y.o. [name], [name] Primary Care Physician: [name] Other Clinician: Referring Physician: Treating Physician/Extender: [name] in Treatment: 12 Education Assessment Education Provided To: Patient Education Topics Provided Wound/Skin Impairment: Methods: Explain/Verbal Responses: State content correctly [employer]) Signed: 01/03/2022 4:10:29 PM By: [name] Hammock RN Entered By: [name] on 12/30/2021 14:55:54 [name], [name] ([id number]) [id number] Page 6 of 7 -------------------------------------------------------------------------------- Wound Assessment Details Patient Name: Date of Service: [name], [name] 12/30/2021 2:15 PM Medical Record Number: [id number] Patient Account Number: [id number] Date of Birth/Sex: Treating RN: Jan 02, 1956 (66 y.o. [name] Primary Care Avrum [name]: [name] Other Clinician: Referring Marney Treloar: Treating Braidyn Scorsone/Extender: [name] in Treatment: 12 Wound Status Wound Number: 5 Primary Diabetic Wound/Ulcer of the Lower Extremity Etiology: Wound Location: Right, Lateral Foot Wound Open Wounding Event: Blister Status: Date Acquired: 11/22/2021 Comorbid Sleep Apnea, Coronary Artery Disease, Hypertension, Type II Weeks Of Treatment: 5 History: Diabetes, Neuropathy Clustered Wound: No Photos Wound Measurements Length: (cm) 0 Width: (cm) 0 Depth: (cm) 0 Area: (cm) Volume: (cm) .1 % Reduction in Area: 99.5% .1 % Reduction in Volume: 99.8% .1  Epithelialization: Large (67-100%) 0.008 Tunneling: No 0.001 Undermining: No Wound Description Classification: Grade 2 Wound Margin: Distinct, outline attached Exudate Amount: Medium Exudate Type: Serosanguineous Exudate Color: red, brown Foul Odor After Cleansing: No Slough/Fibrino Yes Wound Bed Granulation Amount: Small (1-33%) Exposed Structure Granulation Quality: Pink, Pale Fascia Exposed: No Necrotic Amount: Large (67-100%) Fat Layer (Subcutaneous Tissue) Exposed: Yes Necrotic Quality: Eschar Tendon Exposed: No Muscle Exposed: No Joint Exposed: No Bone Exposed: No Periwound Skin Texture Texture Color No Abnormalities Noted: No No Abnormalities Noted: Yes Callus: Yes Temperature / Pain Crepitus: No Temperature: No Abnormality Excoriation: No Induration: No Rash: No Scarring: No Moisture No Abnormalities Noted: No Dry / Scaly: No [name], [name] ([id number]) [id number] Page 7 of 7 Maceration: No Treatment Notes Wound #5 (Foot) Wound Laterality: Right, Lateral Cleanser Soap and Water Discharge Instruction: May shower and wash wound with dial antibacterial soap and water prior to dressing change. Peri-Wound Care Topical Primary Dressing Hydrofera Blue Ready Foam, 2.5 x2.5 in Discharge Instruction: Apply to wound bed as instructed MediHoney Gel, tube 1.5 (oz) Discharge Instruction: Apply to wound bed as instructed Secondary Dressing Optifoam Non-Adhesive Dressing, 4x4 in Discharge Instruction: Apply over primary dressing as directed. Woven Gauze Sponge, Non-Sterile 4x4 in Discharge Instruction: Apply over primary dressing as directed. Secured With 14M Medipore H Soft Cloth Surgical T ape, 4 x 10 (in/yd) Discharge Instruction: Secure with tape as directed. Compression Wrap Compression Stockings Add-Ons Electronic Signature(s) Signed: 12/30/2021 3:31:30 PM By: Erenest Blank Signed: 12/30/2021 3:49:20 PM By: Sabas Sous By: Erenest Blank on 12/30/2021 14:35:28 -------------------------------------------------------------------------------- Vitals Details Patient Name: Date of Service: [name], [name] 12/30/2021 2:15 PM Medical Record Number:  [id number] Patient Account Number: [id number] Date of Birth/Sex: Treating RN: [birth year] (66 y.o. [name] Primary Care Jayvian Escoe: [name] Other Clinician: Referring Kamaury Cutbirth: Treating Odis Wickey/Extender: [name] in Treatment: 12 Vital Signs Time Taken: 14:30 Temperature (F): 97.8 Pulse (bpm): 83 Respiratory Rate (breaths/min): 18 Blood Pressure (mmHg): 133/77 Capillary Blood Glucose (mg/dl): 124 Reference Range: 80 - 120 mg / dl Electronic Signature(s) Signed: 12/30/2021 3:49:20 PM By: [name] Entered By: [name] on 12/30/2021 14:32:33

## 2022-01-05 DIAGNOSIS — E1129 Type 2 diabetes mellitus with other diabetic kidney complication: Secondary | ICD-10-CM | POA: Diagnosis not present

## 2022-01-05 DIAGNOSIS — E785 Hyperlipidemia, unspecified: Secondary | ICD-10-CM | POA: Diagnosis not present

## 2022-01-05 DIAGNOSIS — I10 Essential (primary) hypertension: Secondary | ICD-10-CM | POA: Diagnosis not present

## 2022-01-05 DIAGNOSIS — Z4681 Encounter for fitting and adjustment of insulin pump: Secondary | ICD-10-CM | POA: Diagnosis not present

## 2022-01-05 DIAGNOSIS — Z794 Long term (current) use of insulin: Secondary | ICD-10-CM | POA: Diagnosis not present

## 2022-01-06 ENCOUNTER — Ambulatory Visit (HOSPITAL_COMMUNITY): Payer: Medicare Other | Admitting: Licensed Clinical Social Worker

## 2022-01-06 ENCOUNTER — Encounter (HOSPITAL_BASED_OUTPATIENT_CLINIC_OR_DEPARTMENT_OTHER): Payer: Medicare Other | Admitting: Internal Medicine

## 2022-01-07 ENCOUNTER — Encounter (HOSPITAL_BASED_OUTPATIENT_CLINIC_OR_DEPARTMENT_OTHER): Payer: Medicare Other | Admitting: Internal Medicine

## 2022-01-07 DIAGNOSIS — F17210 Nicotine dependence, cigarettes, uncomplicated: Secondary | ICD-10-CM | POA: Diagnosis not present

## 2022-01-07 DIAGNOSIS — Z794 Long term (current) use of insulin: Secondary | ICD-10-CM | POA: Diagnosis not present

## 2022-01-07 DIAGNOSIS — L97512 Non-pressure 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: Secondary | ICD-10-CM

## 2022-01-07 DIAGNOSIS — E1142 Type 2 diabetes mellitus with diabetic polyneuropathy: Secondary | ICD-10-CM | POA: Diagnosis not present

## 2022-01-07 DIAGNOSIS — E11621 Type 2 diabetes mellitus with foot ulcer: Secondary | ICD-10-CM | POA: Diagnosis not present

## 2022-01-08 NOTE — Progress Notes (Signed)
TRINIDY, MASTERSON ([id number]) [id number] Page 1 of 11 Visit Report for 01/07/2022 Chief Complaint Document Details Patient Name: Date of Service: [name] 01/07/2022 8:00 A M Medical Record Number: [id number] Patient Account Number: [id number] Date of Birth/Sex: Treating RN: Sep 25, 1955 (66 y.o. F) Primary Care Provider: [name] Other Clinician: Referring Provider: Treating Provider/Extender: [name] in Treatment: 14 Information Obtained from: Patient Chief Complaint 10/27; Right Plantar foot wound 11/21; 2 small areas of skin breakdown to the right foot following cast placement 8/29; right plantar foot wound Electronic Signature(s) Signed: 01/07/2022 10:08:50 AM By: Kalman Shan DO Entered By: Kalman Shan on 01/07/2022 08:59:52 -------------------------------------------------------------------------------- Cellular or Tissue Based Product Details Patient Name: Date of Service: [name] 01/07/2022 8:00 A M Medical Record Number: [id number] Patient Account Number: [id number] Date of Birth/Sex: Treating RN: March 17, 1955 (66 y.o. [name], [name] Primary Care Provider: [name] Other Clinician: Referring Provider: Treating Provider/Extender: [name] in Treatment: 14 Cellular or Tissue Based Product Type Wound #5 Right,Lateral Foot Applied to: Performed By: Physician Kalman Shan, DO Cellular or Tissue Based Product Type: Grafix prime Level of Consciousness (Pre-procedure): Awake and Alert Pre-procedure Verification/Time Out Yes - 08:30 Taken: Location: genitalia / hands / feet / multiple digits Wound Size (sq cm): 0.04 Product Size (sq cm): 6 Waste Size (sq cm): 5 Waste Reason: wound size Amount of Product Applied (sq cm): 1 Instrument Used: Forceps, Scissors Lot #: [id number] Order #: 4 Expiration Date:  03/06/2023 Fenestrated: No Reconstituted: Yes Solution Type: Normal Saline Solution Amount: 20m Lot #: [id number] Expiration Date: 06/20/2024 Secured: Yes Secured With: Steri-Strips, adaptic Dressing Applied: No Procedural Pain: 0 Post Procedural Pain: 0 [name] ([id number] Page 2 of 11 Response to Treatment: Procedure was tolerated well Level of Consciousness (Post- Awake and Alert procedure): Post Procedure Diagnosis Same as Pre-procedure Electronic Signature(s) Signed: 01/07/2022 10:08:50 AM By: HKalman ShanDO Signed: 01/07/2022 5:21:52 PM By: [name] PillingRN, BSN Entered By: [name] Pillingon 01/07/2022 08:33:51 -------------------------------------------------------------------------------- Debridement Details Patient Name: Date of Service: [name] 01/07/2022 8:00 A M Medical Record Number: [id number] Account Number: [id number] of Birth/Sex: Treating RN: [birth year]((66y.o. FHelene [name] BTammi KlippelPrimary Care Provider: AGeoffery LyonsOther Clinician: Referring Provider: Treating Provider/Extender: HBaird Kayin Treatment: 14 Debridement Performed for Assessment: Wound #5 Right,Lateral Foot Performed By: Physician HKalman Shan DO Debridement Type: Debridement Severity of Tissue Pre Debridement: Fat layer exposed Level of Consciousness (Pre-procedure): Awake and Alert Pre-procedure Verification/Time Out Yes - 08:20 Taken: Start Time: 08:20 Pain Control: Lidocaine 4% T opical Solution T Area Debrided (L x W): otal 1 (cm) x 2 (cm) = 2 (cm) Tissue and other material debrided: Viable, Non-Viable, Callus, Slough, Subcutaneous, Slough Level: Skin/Subcutaneous Tissue Debridement Description: Excisional Instrument: Curette Bleeding: Minimum Hemostasis Achieved: Pressure End Time: 08:26 Procedural Pain: 0 Post Procedural Pain: 0 Response to  Treatment: Procedure was tolerated well Level of Consciousness (Post- Awake and Alert procedure): Post Debridement Measurements of Total Wound Length: (cm) 0.4 Width: (cm) 0.2 Depth: (cm) 0.4 Volume: (cm) 0.025 Character of Wound/Ulcer Post Debridement: Improved Severity of Tissue Post Debridement: Fat layer exposed Post Procedure Diagnosis Same as Pre-procedure Electronic Signature(s) Signed: 01/07/2022 10:08:50 AM By: HKalman ShanDO Signed: 01/07/2022 5:21:52 PM By: [name] PillingRN, BSN Entered By: [name] Pillingon 01/07/2022 08:26:24 [name], [name] ([id number] Page  3 of 11 -------------------------------------------------------------------------------- HPI Details Patient Name: Date of Service: [name] 01/07/2022 8:00 A M Medical Record Number: [id number] Patient Account Number: [id number] Date of Birth/Sex: Treating RN: 1955-10-09 (66 y.o. F) Primary Care Provider: [name] Other Clinician: Referring Provider: Treating Provider/Extender: [name] in Treatment: 14 History of Present Illness HPI Description: Admission 12/17/2020 [name]. [name] is a 66 year old female with a past medical history of insulin-dependent type 2 diabetes, hypothyroidism and daily1 pack per day cigarette smoker the presents to the clinic for a 6-week history of nonhealing wound to the right first MTPJ. She has been following with Dr. Amalia [name], podiatry for this issue. She has been using silver alginate with dressing changes. She uses a postsurgical [name] and offloading pads. She currently denies signs of infection. 10/31; patient presents for follow-up. She tolerated the soft cast fine although she states that she felt her foot rolling to one side. She denies signs of infection. She would like to do the total contact cast today. 12/23/2020 upon evaluation today patient appears to be doing  excellent in regard to her wound on the foot and she is in a total contact cast. I do think this is appropriate this is the first cast change which we are obliged to do to ensure nothing is rubbing everything appears to be doing quite well and very pleased in that regard. 11/7; patient presents for follow-up. She had no issues with the cast. She denies signs of infection. 11/14; patient presents for 1 week follow-up. She has had no issues with the cast. She denies signs of infection. 11/21; patient presents for 1 week follow-up. She did develop 2 small areas of skin breakdown on either side of the right foot from the cast rubbing. She states she did not feel the wounds developing. She currently denies signs of infection. 11/28; patient presents for 1 week follow-up. She has no issues or complaints today. She denies pain or acute signs of infection. 12/12; patient presents for follow-up. She reports improvement to her right lateral foot wound. She has been using silver alginate to the area. She has no issues or complaints today. 12/19; patient presents for follow-up. She reports that her right lateral foot wound has healed. She has no issues or complaints today. 1/3; patient presents for follow-up. She has no issues or complaints today. She reports no open wounds. Readmission 10/01/2021 [name] is a 66 year old female's with a past medical history of insulin-dependent controlled type 2 diabetes complicated by peripheral neuropathy that presents to the clinic for a 49-monthhistory of nonhealing ulcer to the right foot. I have seen her before for a wound to the same area that was treated and healed. Her wound today however this is much deeper and it has thick yellow drainage. She is not sure exactly how it started. She noticed it 1 day. She has been following with Dr. EAmalia Haileyfor this issue. She had a wound culture that showed a mix of organisms on 09/01/2021. She was recently started on  doxycycline. She is using the dConservation officer, nature She has been using Iodosorb with dressing changes. She currently denies systemic signs of infection. 8/21; patient presents for follow-up. She states she started and completed levofloxacin. She is still taking doxycycline. She reports improvement in drainage and odor. She has been doing Dakin's wet-to-dry dressings as well. She is using her front offloading [name]. She denies signs of infection. 8/29; patient presents for follow-up. She  is still taking doxycycline. She continues to report improvement in drainage and reports no odor. She denies any purulent drainage. She has been doing silver alginate to the wound bed and using her front offloading [name]. 9/5; patient presents for follow-up. She completed the course of levofloxacin. She has someone that is able to drive her today so we can place the total contact cast. She reports no signs of infection. We have been doing silver alginate to the wound bed. 9/7; patient presents for follow-up. She has tolerated the total contact cast well. She presents for her obligatory cast change. She has no issues or complaints today. 9/11; patient presents for follow-up. She tolerated the total contact cast well. She has no issues or complaints today. We discussed potentially doing a skin substitute and patient would like to see if her insurance will cover this. 9/18; patient presents for follow-up. She had no issues with the total contact cast. She been approved for Grafix and patient would like to have this placed today. 9/25; patient presents for follow-up. Grafix #1 was placed in standard fashion at last clinic visit. She has no issues or complaints today. She tolerated the cast well. 10/2; patient presents for follow-up. Grafix #2 was placed in standard fashion at last clinic visit. Unfortunately she developed a wound to the lateral aspect of the foot over the past week. She states she has been on her feet a lot more  and walking more. She denies systemic signs of infection. 10/9; patient presents for follow-up. Grafix #3 was placed in standard at last clinic visit. The plantar foot wound has healed with this. She developed a new wound to the lateral aspect last week and this has gotten larger. She reports soreness to this area. She denies systemic signs of infection. 10/16; patient presents for follow-up. Her plantar wound continues to remain closed. Her lateral wound has improved with the use of Hydrofera Blue. She has no issues or complaints today. She denies signs of infection. She has almost completed her course of antibiotics prescribed at last clinic visit. 10/23; patient presents for follow-up. She has been using Hydrofera Blue and Medihoney to the wound bed. She has no issues or complaints today. She has been using her surgical [name]. 10/30; patient presents for follow-up. We have been using Hydrofera Blue and Medihoney to the wound bed. Patient has no issues or complaints today. [name], [name] ([id number]) [id number] Page 4 of 11 11/10; patient presents for follow-up. She has been using Hydrofera Blue and Medihoney without issues. She has been wearing her Orthotics. It is unclear if she is offloading this area or not. 11/17; patient presents for follow-up. She has been using Hydrofera Blue and Medihoney to the foot wound without issues. Grafix was available today for placement and patient would like to proceed with this. Electronic Signature(s) Signed: 01/07/2022 10:08:50 AM By: Kalman Shan DO Entered By: Kalman Shan on 01/07/2022 09:00:39 -------------------------------------------------------------------------------- Physical Exam Details Patient Name: Date of Service: [name] 01/07/2022 8:00 A M Medical Record Number: [id number] Patient Account Number: [id number] Date of Birth/Sex: Treating RN: 1955-11-30 (66 y.o. F) Primary Care  Provider: [name] Other Clinician: Referring Provider: Treating Provider/Extender: [name] in Treatment: 14 Constitutional respirations regular, non-labored and within target range for patient.. Cardiovascular 2+ dorsalis pedis/posterior tibialis pulses. Psychiatric pleasant and cooperative. Notes T the lateral aspect of the right foot there is an open wound with nonviable tissue and granulation tissue. No increased  warmth, erythema or purulent drainage o noted. Electronic Signature(s) Signed: 01/07/2022 10:08:50 AM By: Kalman Shan DO Entered By: Kalman Shan on 01/07/2022 09:01:48 -------------------------------------------------------------------------------- Physician Orders Details Patient Name: Date of Service: [name] 01/07/2022 8:00 A M Medical Record Number: [id number] Patient Account Number: [id number] Date of Birth/Sex: Treating RN: 05/06/1955 (66 y.o. [name], [name] Primary Care Provider: [name] Other Clinician: Referring Provider: Treating Provider/Extender: [name] in Treatment: 14 Verbal / Phone Orders: No Diagnosis Coding ICD-10 Coding Code Description L97.512 Non-pressure 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 E11.621 Type 2 diabetes mellitus with foot ulcer E11.42 Type 2 diabetes mellitus with diabetic polyneuropathy Follow-up Appointments ppointment in 2 weeks. - Dr. Heber Woodlake Monday Return A [name], [name] ([id number]) [id number] Page 5 of 11 Anesthetic (In clinic) Topical Lidocaine 4% applied to wound bed Cellular or Tissue Based Products Cellular or Tissue Based Product Type: - Run IVR for grafix 12/30/21 order Grafix 01/07/2022 Grafix #4 applied Cellular or Tissue Based Product applied to wound bed, secured with steri-strips, cover with Adaptic or Mepitel. (DO NOT  REMOVE). Bathing/ Shower/ Hygiene May shower with protection but do not get wound dressing(s) wet. Edema Control - Lymphedema / SCD / Other Elevate legs to the level of the heart or above for 30 minutes daily and/or when sitting, a frequency of: Avoid standing for long periods of time. Wound Treatment Wound #5 - Foot Wound Laterality: Right, Lateral Cleanser: Soap and Water 1 x Per Week/7 Days Discharge Instructions: May shower and wash wound with dial antibacterial soap and water prior to dressing change. Peri-Wound Care: Skin Prep 1 x Per Week/7 Days Discharge Instructions: Use skin prep as directed Prim Dressing: Grafix PL Prime 1 x Per Week/7 Days ary Discharge Instructions: provider applies directly to wound bed. leave in place. Prim Dressing: adaptic and steri-strips 1 x Per Week/7 Days ary Discharge Instructions: covered over the Graifx. Leave in place. Secondary Dressing: Optifoam Non-Adhesive Dressing, 4x4 in 1 x Per Week/7 Days Discharge Instructions: Apply over primary dressing as directed. Secondary Dressing: Woven Gauze Sponge, Non-Sterile 4x4 in 1 x Per Week/7 Days Discharge Instructions: Apply over primary dressing as directed. Secured With: 38M Medipore H Soft Cloth Surgical T ape, 4 x 10 (in/yd) 1 x Per Week/7 Days Discharge Instructions: Secure with tape as directed. Electronic Signature(s) Signed: 01/07/2022 10:08:50 AM By: Kalman Shan DO Entered By: Kalman Shan on 01/07/2022 09:01:55 -------------------------------------------------------------------------------- Problem List Details Patient Name: Date of Service: [name] 01/07/2022 8:00 A M Medical Record Number: [id number] Patient Account Number: [id number] Date of Birth/Sex: Treating RN: [birth year] (66 y.o. [name], [name] Primary Care Provider: [name] Other Clinician: Referring Provider: Treating Provider/Extender: [name] in  Treatment: 14 Active Problems ICD-10 Encounter Code Description Active Date MDM Diagnosis L97.512 Non-pressure 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 10/01/2021 No Yes E11.621 Type 2 diabetes mellitus with foot ulcer 10/01/2021 No Yes E11.42 Type 2 diabetes mellitus with diabetic polyneuropathy 10/01/2021 No Yes [name], [place] Shiquita [name] ([id number]) [id number] Page 6 of 11 Inactive Problems Resolved Problems Electronic Signature(s) Signed: 01/07/2022 10:08:50 AM By: Kalman Shan DO Entered By: Kalman Shan on 01/07/2022 08:59:27 -------------------------------------------------------------------------------- Progress Note Details Patient Name: Date of Service: [name] 01/07/2022 8:00 A M Medical Record Number: [id number] Patient Account Number: [id number] Date of Birth/Sex: Treating RN: February 19, 1956 (65 y.o.  F) Primary Care Provider: [name] Other Clinician: Referring Provider: Treating Provider/Extender: [name] in Treatment: 14 Subjective Chief Complaint Information obtained from Patient 10/27; Right Plantar foot wound 11/21; 2 small areas of skin breakdown to the right foot following cast placement 8/29; right plantar foot wound History of Present Illness (HPI) Admission 12/17/2020 [name] is a 66 year old female with a past medical history of insulin-dependent type 2 diabetes, hypothyroidism and daily1 pack per day cigarette smoker the presents to the clinic for a 6-week history of nonhealing wound to the right first MTPJ. She has been following with Dr. Amalia [name], podiatry for this issue. She has been using silver alginate with dressing changes. She uses a postsurgical [name] and offloading pads. She currently denies signs of infection. 10/31; patient presents for follow-up. She tolerated the soft cast fine although she states that she felt her foot rolling  to one side. She denies signs of infection. She would like to do the total contact cast today. 12/23/2020 upon evaluation today patient appears to be doing excellent in regard to her wound on the foot and she is in a total contact cast. I do think this is appropriate this is the first cast change which we are obliged to do to ensure nothing is rubbing everything appears to be doing quite well and very pleased in that regard. 11/7; patient presents for follow-up. She had no issues with the cast. She denies signs of infection. 11/14; patient presents for 1 week follow-up. She has had no issues with the cast. She denies signs of infection. 11/21; patient presents for 1 week follow-up. She did develop 2 small areas of skin breakdown on either side of the right foot from the cast rubbing. She states she did not feel the wounds developing. She currently denies signs of infection. 11/28; patient presents for 1 week follow-up. She has no issues or complaints today. She denies pain or acute signs of infection. 12/12; patient presents for follow-up. She reports improvement to her right lateral foot wound. She has been using silver alginate to the area. She has no issues or complaints today. 12/19; patient presents for follow-up. She reports that her right lateral foot wound has healed. She has no issues or complaints today. 1/3; patient presents for follow-up. She has no issues or complaints today. She reports no open wounds. Readmission 10/01/2021 [name] is a 66 year old female's with a past medical history of insulin-dependent controlled type 2 diabetes complicated by peripheral neuropathy that presents to the clinic for a 3-monthhistory of nonhealing ulcer to the right foot. I have seen her before for a wound to the same area that was treated and healed. Her wound today however this is much deeper and it has thick yellow drainage. She is not sure exactly how it started. She noticed it 1 day. She  has been following with Dr. EAmalia Haileyfor this issue. She had a wound culture that showed a mix of organisms on 09/01/2021. She was recently started on doxycycline. She is using the dConservation officer, nature She has been using Iodosorb with dressing changes. She currently denies systemic signs of infection. 8/21; patient presents for follow-up. She states she started and completed levofloxacin. She is still taking doxycycline. She reports improvement in drainage and odor. She has been doing Dakin's wet-to-dry dressings as well. She is using her front offloading [name]. She denies signs of infection. 8/29; patient presents for follow-up. She is still taking doxycycline. She continues to  report improvement in drainage and reports no odor. She denies any purulent drainage. She has been doing silver alginate to the wound bed and using her front offloading [name]. 9/5; patient presents for follow-up. She completed the course of levofloxacin. She has someone that is able to drive her today so we can place the total contact cast. She reports no signs of infection. We have been doing silver alginate to the wound bed. 9/7; patient presents for follow-up. She has tolerated the total contact cast well. She presents for her obligatory cast change. She has no issues or complaints today. [name], [name] ([id number]) [id number] Page 7 of 11 9/11; patient presents for follow-up. She tolerated the total contact cast well. She has no issues or complaints today. We discussed potentially doing a skin substitute and patient would like to see if her insurance will cover this. 9/18; patient presents for follow-up. She had no issues with the total contact cast. She been approved for Grafix and patient would like to have this placed today. 9/25; patient presents for follow-up. Grafix #1 was placed in standard fashion at last clinic visit. She has no issues or complaints today. She tolerated the cast well. 10/2;  patient presents for follow-up. Grafix #2 was placed in standard fashion at last clinic visit. Unfortunately she developed a wound to the lateral aspect of the foot over the past week. She states she has been on her feet a lot more and walking more. She denies systemic signs of infection. 10/9; patient presents for follow-up. Grafix #3 was placed in standard at last clinic visit. The plantar foot wound has healed with this. She developed a new wound to the lateral aspect last week and this has gotten larger. She reports soreness to this area. She denies systemic signs of infection. 10/16; patient presents for follow-up. Her plantar wound continues to remain closed. Her lateral wound has improved with the use of Hydrofera Blue. She has no issues or complaints today. She denies signs of infection. She has almost completed her course of antibiotics prescribed at last clinic visit. 10/23; patient presents for follow-up. She has been using Hydrofera Blue and Medihoney to the wound bed. She has no issues or complaints today. She has been using her surgical [name]. 10/30; patient presents for follow-up. We have been using Hydrofera Blue and Medihoney to the wound bed. Patient has no issues or complaints today. 11/10; patient presents for follow-up. She has been using Hydrofera Blue and Medihoney without issues. She has been wearing her Orthotics. It is unclear if she is offloading this area or not. 11/17; patient presents for follow-up. She has been using Hydrofera Blue and Medihoney to the foot wound without issues. Grafix was available today for placement and patient would like to proceed with this. Patient History Information obtained from Patient, Chart. Family History Unknown History. Social History Current every day smoker - 1 pack/a/day, Alcohol Use - Never, Drug Use - No History, Caffeine Use - Moderate. Medical History Eyes Denies history of Cataracts, Glaucoma, Optic  Neuritis Ear/Nose/Mouth/Throat Denies history of Chronic sinus problems/congestion, Middle ear problems Hematologic/Lymphatic Denies history of Anemia, Hemophilia, Human Immunodeficiency Virus, Lymphedema, Sickle Cell Disease Respiratory Patient has history of Sleep Apnea Denies history of Aspiration, Asthma, Chronic Obstructive Pulmonary Disease (COPD), Pneumothorax, Tuberculosis Cardiovascular Patient has history of Coronary Artery Disease, Hypertension Denies history of Angina, Arrhythmia, Congestive Heart Failure, Deep Vein Thrombosis, Hypotension, Myocardial Infarction, Peripheral Arterial Disease, Peripheral Venous Disease, Phlebitis, Vasculitis Gastrointestinal Denies history of Cirrhosis , Colitis,  Crohnoos, Hepatitis A, Hepatitis B, Hepatitis C Endocrine Patient has history of Type II Diabetes Denies history of Type I Diabetes Genitourinary Denies history of End Stage Renal Disease Immunological Denies history of Lupus Erythematosus, Raynaudoos, Scleroderma Integumentary (Skin) Denies history of History of Burn Musculoskeletal Denies history of Gout, Rheumatoid Arthritis, Osteoarthritis, Osteomyelitis Neurologic Patient has history of Neuropathy Denies history of Dementia, Quadriplegia, Paraplegia, Seizure Disorder Hospitalization/Surgery History - 2nd and 3rd right toe amps. - total hysterectomy. - cholecystectomy. - 2 knee surgery's on left and 1 on right. Medical A Surgical History Notes nd Cardiovascular hypercholesterolemia Objective Constitutional respirations regular, non-labored and within target range for patient.Marland Kitchen [name], [name] ([id number]) [id number] Page 8 of 11 Vitals Time Taken: 7:54 AM, Temperature: 97.7 F, Pulse: 85 bpm, Respiratory Rate: 20 breaths/min, Blood Pressure: 140/71 mmHg, Capillary Blood Glucose: 139 mg/dl. General Notes: this week Hgb A1c at PCP 6, per patient. Cardiovascular 2+ dorsalis  pedis/posterior tibialis pulses. Psychiatric pleasant and cooperative. General Notes: T the lateral aspect of the right foot there is an open wound with nonviable tissue and granulation tissue. No increased warmth, erythema or o purulent drainage noted. Integumentary (Hair, Skin) Wound #5 status is Open. Original cause of wound was Blister. The date acquired was: 11/22/2021. The wound has been in treatment 6 weeks. The wound is located on the Right,Lateral Foot. The wound measures 0.4cm length x 0.1cm width x 0.4cm depth; 0.031cm^2 area and 0.013cm^3 volume. There is Fat Layer (Subcutaneous Tissue) exposed. There is no tunneling or undermining noted. There is a medium amount of serosanguineous drainage noted. The wound margin is distinct with the outline attached to the wound base. There is small (1-33%) pink, pale granulation within the wound bed. There is no necrotic tissue within the wound bed. The periwound skin appearance had no abnormalities noted for color. The periwound skin appearance exhibited: Callus, Dry/Scaly. The periwound skin appearance did not exhibit: Crepitus, Excoriation, Induration, Rash, Scarring, Maceration. Periwound temperature was noted as No Abnormality. Assessment Active Problems ICD-10 Non-pressure 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 Type 2 diabetes mellitus with foot ulcer Type 2 diabetes mellitus with diabetic polyneuropathy Patient's wound appears well-healing. I debrided nonviable tissue. Grafix was placed in standard fashion today. I recommended continuing to aggressively offload this area. She has a surgical [name] with donut offloading pad. Procedures Wound #5 Pre-procedure diagnosis of Wound #5 is a Diabetic Wound/Ulcer of the Lower Extremity located on the Right,Lateral Foot .Severity of Tissue Pre Debridement is: Fat layer exposed. There was a Excisional Skin/Subcutaneous Tissue Debridement with a total area of 2 sq cm performed  by Kalman Shan, DO. With the following instrument(s): Curette to remove Viable and Non-Viable tissue/material. Material removed includes Callus, Subcutaneous Tissue, and Slough after achieving pain control using Lidocaine 4% T opical Solution. A time out was conducted at 08:20, prior to the start of the procedure. A Minimum amount of bleeding was controlled with Pressure. The procedure was tolerated well with a pain level of 0 throughout and a pain level of 0 following the procedure. Post Debridement Measurements: 0.4cm length x 0.2cm width x 0.4cm depth; 0.025cm^3 volume. Character of Wound/Ulcer Post Debridement is improved. Severity of Tissue Post Debridement is: Fat layer exposed. Post procedure Diagnosis Wound #5: Same as Pre-Procedure Pre-procedure diagnosis of Wound #5 is a Diabetic Wound/Ulcer of the Lower Extremity located on the Right,Lateral Foot. A skin graft procedure using a bioengineered skin substitute/cellular or tissue based product was performed by Kalman Shan,  DO with the following instrument(s): Forceps and Scissors. Grafix prime was applied and secured with Steri-Strips and adaptic. 1 sq cm of product was utilized and 5 sq cm was wasted due to wound size. Post Application, no dressing was applied. A Time Out was conducted at 08:30, prior to the start of the procedure. The procedure was tolerated well with a pain level of 0 throughout and a pain level of 0 following the procedure. Post procedure Diagnosis Wound #5: Same as Pre-Procedure . Plan Follow-up Appointments: Return Appointment in 2 weeks. - Dr. Heber Sterling Monday Anesthetic: (In clinic) Topical Lidocaine 4% applied to wound bed Cellular or Tissue Based Products: Cellular or Tissue Based Product Type: - Run IVR for grafix 12/30/21 order Grafix 01/07/2022 Grafix #4 applied Cellular or Tissue Based Product applied to wound bed, secured with steri-strips, cover with Adaptic or Mepitel. (DO NOT REMOVE). Bathing/  Shower/ Hygiene: May shower with protection but do not get wound dressing(s) wet. Edema Control - Lymphedema / SCD / Other: Elevate legs to the level of the heart or above for 30 minutes daily and/or when sitting, a frequency of: Avoid standing for long periods of time. WOUND #5: - Foot Wound Laterality: Right, Lateral Cleanser: Soap and Water 1 x Per Week/7 Days Discharge Instructions: May shower and wash wound with dial antibacterial soap and water prior to dressing change. Peri-Wound Care: Skin Prep 1 x Per Week/7 Days [name], [name] ([id number]) [id number] Page 9 of 11 Discharge Instructions: Use skin prep as directed Prim Dressing: Grafix PL Prime 1 x Per Week/7 Days ary Discharge Instructions: provider applies directly to wound bed. leave in place. Prim Dressing: adaptic and steri-strips 1 x Per Week/7 Days ary Discharge Instructions: covered over the Graifx. Leave in place. Secondary Dressing: Optifoam Non-Adhesive Dressing, 4x4 in 1 x Per Week/7 Days Discharge Instructions: Apply over primary dressing as directed. Secondary Dressing: Woven Gauze Sponge, Non-Sterile 4x4 in 1 x Per Week/7 Days Discharge Instructions: Apply over primary dressing as directed. Secured With: 51M Medipore H Soft Cloth Surgical T ape, 4 x 10 (in/yd) 1 x Per Week/7 Days Discharge Instructions: Secure with tape as directed. 1. In office sharp debridement 2. Grafix placed in standard fashion 3. Follow-up in 1 week Electronic Signature(s) Signed: 01/07/2022 10:08:50 AM By: Kalman Shan DO Entered By: Kalman Shan on 01/07/2022 09:03:59 -------------------------------------------------------------------------------- HxROS Details Patient Name: Date of Service: [name] 01/07/2022 8:00 A M Medical Record Number: [id number] Patient Account Number: [id number] Date of Birth/Sex: Treating RN: 19-Apr-1955 (66 y.o. F) Primary Care Provider: [name] Other Clinician: Referring Provider: Treating Provider/Extender: [name] in Treatment: 14 Information Obtained From Patient Chart Eyes Medical History: Negative for: Cataracts; Glaucoma; Optic Neuritis Ear/Nose/Mouth/Throat Medical History: Negative for: Chronic sinus problems/congestion; Middle ear problems Hematologic/Lymphatic Medical History: Negative for: Anemia; Hemophilia; Human Immunodeficiency Virus; Lymphedema; Sickle Cell Disease Respiratory Medical History: Positive for: Sleep Apnea Negative for: Aspiration; Asthma; Chronic Obstructive Pulmonary Disease (COPD); Pneumothorax; Tuberculosis Cardiovascular Medical History: Positive for: Coronary Artery Disease; Hypertension Negative for: Angina; Arrhythmia; Congestive Heart Failure; Deep Vein Thrombosis; Hypotension; Myocardial Infarction; Peripheral Arterial Disease; Peripheral Venous Disease; Phlebitis; Vasculitis Past Medical History Notes: hypercholesterolemia Gastrointestinal Medical History: Negative for: Cirrhosis ; Colitis; Crohns; Hepatitis A; Hepatitis B; Hepatitis C Endocrine [name], KAUFFMANN ([id number]) [id number] Page 10 of 11 Medical History: Positive for: Type II Diabetes Negative for: Type I Diabetes Genitourinary Medical History: Negative for: End Stage Renal Disease Immunological Medical History: Negative  for: Lupus Erythematosus; Raynauds; Scleroderma Integumentary (Skin) Medical History: Negative for: History of Burn Musculoskeletal Medical History: Negative for: Gout; Rheumatoid Arthritis; Osteoarthritis; Osteomyelitis Neurologic Medical History: Positive for: Neuropathy Negative for: Dementia; Quadriplegia; Paraplegia; Seizure Disorder Immunizations Pneumococcal Vaccine: Received Pneumococcal Vaccination: Yes Received Pneumococcal Vaccination On or After 60th Birthday: Yes Tetanus Vaccine: Last tetanus shot:  12/17/2020 Implantable Devices No devices added Hospitalization / Surgery History Type of Hospitalization/Surgery 2nd and 3rd right toe amps total hysterectomy cholecystectomy 2 knee surgery's on left and 1 on right Family and Social History Unknown History: Yes; Current every day smoker - 1 pack/a/day; Alcohol Use: Never; Drug Use: No History; Caffeine Use: Moderate; Financial Concerns: No; Food, Clothing or Shelter Needs: No; Support System Lacking: No; Transportation Concerns: No Electronic Signature(s) Signed: 01/07/2022 10:08:50 AM By: Kalman Shan DO Entered By: Kalman Shan on 01/07/2022 09:00:47 -------------------------------------------------------------------------------- SuperBill Details Patient Name: Date of Service: [name] 01/07/2022 Medical Record Number: [id number] Patient Account Number: [id number] Date of Birth/Sex: Treating RN: Oct 27, 1955 (66 y.o. [name], [name] Primary Care Provider: [name] Other Clinician: Referring Provider: Treating Provider/Extender: [name] in Treatment: [address], [place] ([id number]) [id number] Page 11 of 11 ICD-10 Codes Code Description 4128070971 Non-pressure 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 E11.621 Type 2 diabetes mellitus with foot ulcer E11.42 Type 2 diabetes mellitus with diabetic polyneuropathy Facility Procedures : CPT4 Code: 65681275 Description: T7001- Grafix PL 2X3 sq cm Modifier: Quantity: 6 : CPT4 Code: [id number] Description: 59163 - SKIN SUB GRAFT FACE/NK/HF/G ICD-10 Diagnosis Description L97.512 Non-pressure 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 Modifier: Quantity: 1 Physician Procedures : CPT4 Code Description Modifier [id number] - WC PHYS SKIN SUB GRAFT FACE/NK/HF/G ICD-10 Diagnosis Description L97.512 Non-pressure chronic ulcer of  other part of right foot with fat layer exposed Quantity: 1 Electronic Signature(s) Signed: 01/07/2022 10:08:50 AM By: Kalman Shan DO Entered By: Kalman Shan on 01/07/2022 09:04:09

## 2022-01-08 NOTE — Progress Notes (Signed)
[name] ([id number]) [id number] Page 1 of 8 Visit Report for 01/07/2022 Arrival Information Details Patient Name: Date of Service: [name], [name] 01/07/2022 8:00 A M Medical Record Number: [id number] Patient Account Number: [id number] Date of Birth/Sex: Treating RN: [name] 27, 1957 (66 y.o. [name], [name] Primary Care [name]: [name] Other Clinician: Referring [name]: Treating [name]/Extender: [name] in Treatment: 14 Visit Information History Since Last Visit Added or deleted any medications: No Patient Arrived: Ambulatory Any new allergies or adverse reactions: No Arrival Time: 07:53 Had a fall or experienced change in No Accompanied By: self activities of daily living that [name] affect Transfer Assistance: None risk of falls: Patient Identification Verified: Yes Signs or symptoms of abuse/neglect since last visito No Secondary Verification Process Completed: Yes Hospitalized since last visit: No Patient Requires Transmission-Based Precautions: No Implantable device outside of the clinic excluding No Patient Has Alerts: No cellular tissue based products placed in the center since last visit: Has Dressing in Place as Prescribed: Yes Pain Present Now: No Electronic Signature(s) Signed: 01/07/2022 5:21:52 PM By: [name] Pilling RN, BSN Entered By: [name] on 01/07/2022 07:54:09 -------------------------------------------------------------------------------- Encounter Discharge Information Details Patient Name: Date of Service: [name], [name] 01/07/2022 8:00 A M Medical Record Number: [id number] Patient Account Number: [id number] Date of Birth/Sex: Treating RN: 1955-05-31 (66 y.o. [name], [name] Primary Care [place]: [name] Other Clinician: Referring Joory Gough: Treating [name]/Extender: [name] in Treatment:  14 Encounter Discharge Information Items Post Procedure Vitals Discharge Condition: Stable Temperature (F): 97.7 Ambulatory Status: Ambulatory Pulse (bpm): 85 Discharge Destination: Home Respiratory Rate (breaths/min): 20 Transportation: Private Auto Blood Pressure (mmHg): 140/71 Accompanied By: self Schedule Follow-up Appointment: Yes Clinical Summary of Care: Electronic Signature(s) Signed: 01/07/2022 5:21:52 PM By: [name] Pilling RN, BSN Entered By: [name] on 01/07/2022 08:37:03 [name], [name] ([id number]) [id number] Page 2 of 8 -------------------------------------------------------------------------------- Lower Extremity Assessment Details Patient Name: Date of Service: [name], [name] 01/07/2022 8:00 A M Medical Record Number: [id number] Patient Account Number: [id number] Date of Birth/Sex: Treating RN: 1955/12/30 (66 y.o. [name], [name] Primary Care Lalisa Kiehn: [name] Other Clinician: Referring Dallie Patton: Treating Jannie Doyle/Extender: [name] in Treatment: 14 Edema Assessment Assessed: [name]: No] [name]: Yes] Edema: [Left: Ye] [Right: s] Calf Left: Right: Point of Measurement: From Medial Instep 43 cm Ankle Left: Right: Point of Measurement: From Medial Instep 23 cm Vascular Assessment Pulses: Dorsalis Pedis Palpable: [Right:Yes] Electronic Signature(s) Signed: 01/07/2022 5:21:52 PM By: [name] Pilling RN, BSN Entered By: [name] on 01/07/2022 07:57:57 -------------------------------------------------------------------------------- Multi Wound Chart Details Patient Name: Date of Service: [name], [name] 01/07/2022 8:00 A M Medical Record Number: [id number] Patient Account Number: [id number] Date of Birth/Sex: Treating RN: 06/02/1955 (66 y.o. F) Primary Care [place]: [name] Other Clinician: Referring Tonette Koehne: Treating Malijah Lietz/Extender: [name] in Treatment: 14 Vital Signs Height(in): Capillary Blood Glucose(mg/dl): 139 Weight(lbs): Pulse(bpm): 46 Body Mass Index(BMI): Blood Pressure(mmHg): 140/71 Temperature(F): 97.7 Respiratory Rate(breaths/min): 20 [5:Photos:] [N/A:N/A] Right, Lateral Foot N/A N/A Wound Location: Blister N/A N/A Wounding Event: Diabetic Wound/Ulcer of the Lower N/A N/A Primary Etiology: Extremity Sleep Apnea, Coronary Artery N/A N/A Comorbid History: Disease, Hypertension, Type II Diabetes, Neuropathy 11/22/2021 N/A N/A Date Acquired: 6 N/A N/A Weeks of Treatment: Open N/A N/A Wound Status: No N/A N/A Wound Recurrence: 0.4x0.1x0.4 N/A N/A Measurements L x W x D (cm)  0.031 N/A N/A A (cm) : rea 0.013 N/A N/A Volume (cm) : 98.20% N/A N/A % Reduction in A rea: 97.50% N/A N/A % Reduction in Volume: Grade 2 N/A N/A Classification: Medium N/A N/A Exudate A mount: Serosanguineous N/A N/A Exudate Type: red, brown N/A N/A Exudate Color: Distinct, outline attached N/A N/A Wound Margin: Small (1-33%) N/A N/A Granulation A mount: Pink, Pale N/A N/A Granulation Quality: None Present (0%) N/A N/A Necrotic A mount: Fat Layer (Subcutaneous Tissue): Yes N/A N/A Exposed Structures: Fascia: No Tendon: No Muscle: No Joint: No Bone: No Large (67-100%) N/A N/A Epithelialization: Debridement - Excisional N/A N/A Debridement: Pre-procedure Verification/Time Out 08:20 N/A N/A Taken: Lidocaine 4% Topical Solution N/A N/A Pain Control: Callus, Subcutaneous, Slough N/A N/A Tissue Debrided: Skin/Subcutaneous Tissue N/A N/A Level: 2 N/A N/A Debridement A (sq cm): rea Curette N/A N/A Instrument: Minimum N/A N/A Bleeding: Pressure N/A N/A Hemostasis A chieved: 0 N/A N/A Procedural Pain: 0 N/A N/A Post Procedural Pain: Procedure was tolerated well N/A N/A Debridement Treatment Response: 0.4x0.2x0.4 N/A N/A Post Debridement Measurements L x W  x D (cm) 0.025 N/A N/A Post Debridement Volume: (cm) Callus: Yes N/A N/A Periwound Skin Texture: Excoriation: No Induration: No Crepitus: No Rash: No Scarring: No Dry/Scaly: Yes N/A N/A Periwound Skin Moisture: Maceration: No Atrophie Blanche: No N/A N/A Periwound Skin Color: Cyanosis: No Ecchymosis: No Erythema: No Hemosiderin Staining: No Mottled: No Pallor: No Rubor: No No Abnormality N/A N/A Temperature: Cellular or Tissue Based Product N/A N/A Procedures Performed: Debridement Treatment Notes Wound #5 (Foot) Wound Laterality: Right, Lateral Cleanser Soap and Water Discharge Instruction: [name] shower and wash wound with dial antibacterial soap and water prior to dressing change. Peri-Wound Care Skin Prep Discharge Instruction: Use skin prep as directed Topical Primary Dressing Grafix PL Prime Discharge Instruction: Lilley Hubble applies directly to wound bed. leave in place. [name], [name] ([id number]) [id number] Page 4 of 8 adaptic and steri-strips Discharge Instruction: covered over the Graifx. Leave in place. Secondary Dressing Optifoam Non-Adhesive Dressing, 4x4 in Discharge Instruction: Apply over primary dressing as directed. Woven Gauze Sponge, Non-Sterile 4x4 in Discharge Instruction: Apply over primary dressing as directed. Secured With 74M Medipore H Soft Cloth Surgical T ape, 4 x 10 (in/yd) Discharge Instruction: Secure with tape as directed. Compression Wrap Compression Stockings Add-Ons Electronic Signature(s) Signed: 01/07/2022 10:08:50 AM By: Kalman Shan DO Entered By: Kalman Shan on 01/07/2022 08:59:34 -------------------------------------------------------------------------------- Multi-Disciplinary Care Plan Details Patient Name: Date of Service: [name], [name] 01/07/2022 8:00 A M Medical Record Number: [id number] Patient Account Number: [id number] Date of Birth/Sex: Treating RN: October 08, 1955  (66 y.o. [name], [name] Primary Care Jadian Karman: [name] Other Clinician: Referring Hydia Copelin: Treating Sarra Rachels/Extender: [name] in Treatment: 14 Active Inactive Wound/Skin Impairment Nursing Diagnoses: Impaired tissue integrity Knowledge deficit related to ulceration/compromised skin integrity Goals: Patient will have a decrease in wound volume by X% from date: (specify in notes) Date Initiated: 10/01/2021 Target Resolution Date: 01/22/2022 Goal Status: Active Patient/caregiver will verbalize understanding of skin care regimen Date Initiated: 10/01/2021 Target Resolution Date: 01/22/2022 Goal Status: Active Ulcer/skin breakdown will have a volume reduction of 30% by week 4 Date Initiated: 10/01/2021 Target Resolution Date: 01/21/2022 Goal Status: Active Interventions: Assess patient/caregiver ability to obtain necessary supplies Assess patient/caregiver ability to perform ulcer/skin care regimen upon admission and as needed Assess ulceration(s) every visit Notes: Electronic Signature(s) Signed: 01/07/2022 5:21:52 PM By: [name] Pilling RN, BSN Entered By: [name] on 01/07/2022 08:01:07 Salmela,  [name] ([id number]) [id number] Page 5 of 8 -------------------------------------------------------------------------------- Pain Assessment Details Patient Name: Date of Service: [name], [name] 01/07/2022 8:00 A M Medical Record Number: [id number] Patient Account Number: [id number] Date of Birth/Sex: Treating RN: [birth year] (66 y.o. [name] Primary Care Garnell Begeman: [name] Other Clinician: Referring Hassaan Crite: Treating Terrionna Bridwell/Extender: [name] in Treatment: 14 Active Problems Location of Pain Severity and Description of Pain Patient Has Paino No Site Locations Rate the pain. Current Pain Level: 0 Pain Management and Medication Current Pain  Management: Medication: No Cold Application: No Rest: No Massage: No Activity: No T.E.N.S.: No Heat Application: No Leg drop or elevation: No Is the Current Pain Management Adequate: Adequate How does your wound impact your activities of daily livingo Sleep: No Bathing: No Appetite: No Relationship With Others: No Bladder Continence: No Emotions: No Bowel Continence: No Work: No Toileting: No Drive: No Dressing: No Hobbies: No Engineer, maintenance) Signed: 01/07/2022 5:21:52 PM By: [name] Pilling RN, BSN Entered By: [name] on 01/07/2022 07:55:41 -------------------------------------------------------------------------------- Patient/Caregiver Education Details Patient Name: Date of Service: [name], [name] 11/17/2023andnbsp8:00 A M Medical Record Number: [id number] Patient Account Number: [id number] Date of Birth/Gender: Treating RN: Jun 05, 1955 (66 y.o. [name], [name] Primary Care Physician: [name] Other Clinician: Referring Physician: Treating Physician/Extender: Henrine Screws, [name] ([id number]) [phone].pdf Page 6 of 8 Weeks in Treatment: 14 Education Assessment Education Provided To: Patient Education Topics Provided Wound/Skin Impairment: Handouts: Skin Care Do's and Dont's Methods: Explain/Verbal Responses: Reinforcements needed Electronic Signature(s) Signed: 01/07/2022 5:21:52 PM By: [name] Pilling RN, BSN Entered By: [name] on 01/07/2022 08:01:19 -------------------------------------------------------------------------------- Wound Assessment Details Patient Name: Date of Service: [name], [name] 01/07/2022 8:00 A M Medical Record Number: [id number] Patient Account Number: [id number] Date of Birth/Sex: Treating RN: 08-21-1955 (66 y.o. [name], [name] Primary Care Kmarion Rawl: [name] Other Clinician: Referring Marleny Faller: Treating  [name]/Extender: [name] in Treatment: 14 Wound Status Wound Number: 5 Primary Diabetic Wound/Ulcer of the Lower Extremity Etiology: Wound Location: Right, Lateral Foot Wound Open Wounding Event: Blister Status: Date Acquired: 11/22/2021 Comorbid Sleep Apnea, Coronary Artery Disease, Hypertension, Type II Weeks Of Treatment: 6 History: Diabetes, Neuropathy Clustered Wound: No Photos Wound Measurements Length: (cm) 0.4 Width: (cm) 0.1 Depth: (cm) 0.4 Area: (cm) 0.031 Volume: (cm) 0.013 % Reduction in Area: 98.2% % Reduction in Volume: 97.5% Epithelialization: Large (67-100%) Tunneling: No Undermining: No Wound Description Classification: Grade 2 Wound Margin: Distinct, outline attached Exudate Amount: Medium Exudate Type: Serosanguineous Exudate Color: red, brown Foul Odor After Cleansing: No Slough/Fibrino No Wound Bed [name], [name] ([id number]) [id number] Page 7 of 8 Granulation Amount: Small (1-33%) Exposed Structure Granulation Quality: Pink, Pale Fascia Exposed: No Necrotic Amount: None Present (0%) Fat Layer (Subcutaneous Tissue) Exposed: Yes Tendon Exposed: No Muscle Exposed: No Joint Exposed: No Bone Exposed: No Periwound Skin Texture Texture Color No Abnormalities Noted: No No Abnormalities Noted: Yes Callus: Yes Temperature / Pain Crepitus: No Temperature: No Abnormality Excoriation: No Induration: No Rash: No Scarring: No Moisture No Abnormalities Noted: No Dry / Scaly: Yes Maceration: No Treatment Notes Wound #5 (Foot) Wound Laterality: Right, Lateral Cleanser Soap and Water Discharge Instruction: [name] shower and wash wound with dial antibacterial soap and water prior to dressing change. Peri-Wound Care Skin Prep Discharge Instruction: Use skin prep as directed Topical Primary Dressing Grafix PL Prime Discharge Instruction: [name] applies directly to wound  bed.  leave in place. adaptic and steri-strips Discharge Instruction: covered over the Graifx. Leave in place. Secondary Dressing Optifoam Non-Adhesive Dressing, 4x4 in Discharge Instruction: Apply over primary dressing as directed. Woven Gauze Sponge, Non-Sterile 4x4 in Discharge Instruction: Apply over primary dressing as directed. Secured With 44M Medipore H Soft Cloth Surgical T ape, 4 x 10 (in/yd) Discharge Instruction: Secure with tape as directed. Compression Wrap Compression Stockings Add-Ons Electronic Signature(s) Signed: 01/07/2022 5:21:52 PM By: [name] Pilling RN, BSN Entered By: [name] on 01/07/2022 07:59:35 -------------------------------------------------------------------------------- Vitals Details Patient Name: Date of Service: [name], [name] 01/07/2022 8:00 A M Medical Record Number: [id number] Patient Account Number: [id number] [name], [name] ([id number]) [id number] Page 8 of 8 Date of Birth/Sex: Treating RN: 05/05/1955 (66 y.o. [name], [name] Primary Care Nakaya Mishkin: [name] Other Clinician: Referring Jerrad Mendibles: Treating Adelie Croswell/Extender: [name] in Treatment: 14 Vital Signs Time Taken: 07:54 Temperature (F): 97.7 Pulse (bpm): 85 Respiratory Rate (breaths/min): 20 Blood Pressure (mmHg): 140/71 Capillary Blood Glucose (mg/dl): 139 Reference Range: 80 - 120 mg / dl Notes this week Hgb A1c at PCP 6, per patient. Electronic Signature(s) Signed: 01/07/2022 5:21:52 PM By: [name] Pilling RN, BSN Entered By: [name] on 01/07/2022 07:55:51

## 2022-01-24 ENCOUNTER — Encounter (HOSPITAL_BASED_OUTPATIENT_CLINIC_OR_DEPARTMENT_OTHER): Payer: Medicare Other | Attending: Internal Medicine | Admitting: Internal Medicine

## 2022-01-24 DIAGNOSIS — E1142 Type 2 diabetes mellitus with diabetic polyneuropathy: Secondary | ICD-10-CM | POA: Insufficient documentation

## 2022-01-24 DIAGNOSIS — F17210 Nicotine dependence, cigarettes, uncomplicated: Secondary | ICD-10-CM | POA: Diagnosis not present

## 2022-01-24 DIAGNOSIS — Z794 Long term (current) use of insulin: Secondary | ICD-10-CM | POA: Diagnosis not present

## 2022-01-24 DIAGNOSIS — E11621 Type 2 diabetes mellitus with foot ulcer: Secondary | ICD-10-CM | POA: Insufficient documentation

## 2022-01-24 DIAGNOSIS — L97512 Non-pressure 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: Secondary | ICD-10-CM | POA: Diagnosis not present

## 2022-01-24 NOTE — Progress Notes (Addendum)
TARINI, CARRIER ([id number]) [id number] Page 1 of 8 Visit Report for 01/24/2022 Arrival Information Details Patient Name: Date of Service: [name] [name], [name] 01/24/2022 8:00 A M Medical Record Number: [id number] Patient Account Number: [id number] Date of Birth/Sex: Treating RN: [birth year] (66 y.o. [name], [name] Primary Care Dahl Higinbotham: [name] Other Clinician: Referring Juelz Claar: Treating Emmaleigh Longo/Extender: [name] in Treatment: 16 Visit Information History Since Last Visit Added or deleted any medications: No Patient Arrived: Ambulatory Any new allergies or adverse reactions: No Arrival Time: 08:00 Had a fall or experienced change in No Accompanied By: self activities of daily living that may affect Transfer Assistance: None risk of falls: Patient Identification Verified: Yes Signs or symptoms of abuse/neglect since last visito No Secondary Verification Process Completed: Yes Hospitalized since last visit: No Patient Requires Transmission-Based Precautions: No Implantable device outside of the clinic excluding No Patient Has Alerts: No cellular tissue based products placed in the center since last visit: Has Dressing in Place as Prescribed: No Pain Present Now: No Electronic Signature(s) Signed: 01/24/2022 4:59:17 PM By: Deon Pilling RN, BSN Entered By: Deon Pilling on 01/24/2022 08:03:56 -------------------------------------------------------------------------------- Clinic Level of Care Assessment Details Patient Name: Date of Service: [name] [name], [name] 01/24/2022 8:00 A M Medical Record Number: [id number] Patient Account Number: [id number] Date of Birth/Sex: Treating RN: 1955/12/09 (66 y.o. [name], [name] Primary Care Aaban Griep: [name] Other Clinician: Referring Tamel Abel: Treating Darlis Wragg/Extender: [name] in Treatment: 16 Clinic  Level of Care Assessment Items TOOL 4 Quantity Score X- 1 0 Use when only an EandM is performed on FOLLOW-UP visit ASSESSMENTS - Nursing Assessment / Reassessment X- 1 10 Reassessment of Co-morbidities (includes updates in patient status) X- 1 5 Reassessment of Adherence to Treatment Plan ASSESSMENTS - Wound and Skin A ssessment / Reassessment X - Simple Wound Assessment / Reassessment - one wound 1 5 '[]'$  - 0 Complex Wound Assessment / Reassessment - multiple wounds '[]'$  - 0 Dermatologic / Skin Assessment (not related to wound area) ASSESSMENTS - Focused Assessment X- 1 5 Circumferential Edema Measurements - multi extremities '[]'$  - 0 Nutritional Assessment / Counseling / Intervention MARRIA, MATHISON ([id number]) [phone].pdf Page 2 of 8 '[]'$  - 0 Lower Extremity Assessment (monofilament, tuning fork, pulses) '[]'$  - 0 Peripheral Arterial Disease Assessment (using hand held doppler) ASSESSMENTS - Ostomy and/or Continence Assessment and Care '[]'$  - 0 Incontinence Assessment and Management '[]'$  - 0 Ostomy Care Assessment and Management (repouching, etc.) PROCESS - Coordination of Care X - Simple Patient / Family Education for ongoing care 1 15 '[]'$  - 0 Complex (extensive) Patient / Family Education for ongoing care X- 1 10 Staff obtains Programmer, systems, Records, T Results / Process Orders est '[]'$  - 0 Staff telephones HHA, Nursing Homes / Clarify orders / etc '[]'$  - 0 Routine Transfer to another Facility (non-emergent condition) '[]'$  - 0 Routine Hospital Admission (non-emergent condition) '[]'$  - 0 New Admissions / Biomedical engineer / Ordering NPWT Apligraf, etc. , '[]'$  - 0 Emergency Hospital Admission (emergent condition) X- 1 10 Simple Discharge Coordination '[]'$  - 0 Complex (extensive) Discharge Coordination PROCESS - Special Needs '[]'$  - 0 Pediatric / Minor Patient Management '[]'$  - 0 Isolation Patient Management '[]'$  - 0 Hearing / Language / Visual special  needs '[]'$  - 0 Assessment of Community assistance (transportation, D/C planning, etc.) '[]'$  - 0 Additional assistance / Altered mentation '[]'$  - 0 Support Surface(s) Assessment (bed, cushion, seat, etc.) INTERVENTIONS -  Wound Cleansing / Measurement X - Simple Wound Cleansing - one wound 1 5 '[]'$  - 0 Complex Wound Cleansing - multiple wounds X- 1 5 Wound Imaging (photographs - any number of wounds) '[]'$  - 0 Wound Tracing (instead of photographs) X- 1 5 Simple Wound Measurement - one wound '[]'$  - 0 Complex Wound Measurement - multiple wounds INTERVENTIONS - Wound Dressings X - Small Wound Dressing one or multiple wounds 1 10 '[]'$  - 0 Medium Wound Dressing one or multiple wounds '[]'$  - 0 Large Wound Dressing one or multiple wounds '[]'$  - 0 Application of Medications - topical '[]'$  - 0 Application of Medications - injection INTERVENTIONS - Miscellaneous '[]'$  - 0 External ear exam '[]'$  - 0 Specimen Collection (cultures, biopsies, blood, body fluids, etc.) '[]'$  - 0 Specimen(s) / Culture(s) sent or taken to Lab for analysis '[]'$  - 0 Patient Transfer (multiple staff / Civil Service fast streamer / Similar devices) '[]'$  - 0 Simple Staple / Suture removal (25 or less) '[]'$  - 0 Complex Staple / Suture removal (26 or more) '[]'$  - 0 Hypo / Hyperglycemic Management (close monitor of Blood Glucose) Statler, [place] K ([id number]) [id number] Page 3 of 8 '[]'$  - 0 Ankle / Brachial Index (ABI) - do not check if billed separately X- 1 5 Vital Signs Has the patient been seen at the hospital within the last three years: Yes Total Score: 90 Level Of Care: New/Established - Level 3 Electronic Signature(s) Signed: 02/23/2022 5:35:40 PM By: Rhae Hammock RN Entered By: Rhae Hammock on 02/04/2022 10:58:43 -------------------------------------------------------------------------------- Encounter Discharge Information Details Patient Name: Date of Service: [name] [place], [name] 01/24/2022 8:00 A  M Medical Record Number: [id number] Patient Account Number: [id number] Date of Birth/Sex: Treating RN: [birth year] (66 y.o. [name], [name] Primary Care Andilyn Bettcher: [name] Other Clinician: Referring Lindzie Boxx: Treating Kito Cuffe/Extender: [name] in Treatment: 16 Encounter Discharge Information Items Discharge Condition: Stable Ambulatory Status: Ambulatory Discharge Destination: Home Transportation: Private Auto Accompanied By: self Schedule Follow-up Appointment: Yes Clinical Summary of Care: Patient Declined Electronic Signature(s) Signed: 02/23/2022 5:35:40 PM By: Rhae Hammock RN Entered By: Rhae Hammock on 02/04/2022 10:59:29 -------------------------------------------------------------------------------- Lower Extremity Assessment Details Patient Name: Date of Service: [name] [name], [name] 01/24/2022 8:00 A M Medical Record Number: [id number] Patient Account Number: [id number] Date of Birth/Sex: Treating RN: 1955-09-30 (66 y.o. [name], [name] Primary Care Cariana Karge: [name] Other Clinician: Referring Makahla Kiser: Treating Gaynelle Pastrana/Extender: [name] in Treatment: 16 Edema Assessment Assessed: [name]: No] Patrice Paradise: Yes] Edema: [Left: Ye] [Right: s] Calf Left: Right: Point of Measurement: From Medial Instep 42 cm Ankle Left: Right: Point of Measurement: From Medial Instep 21 cm Vascular Assessment Seefeldt, Alexus K ([id number]) [Right:[id number] Page 4 of 8] Pulses: Dorsalis Pedis Palpable: [Right:Yes] Electronic Signature(s) Signed: 01/24/2022 4:59:17 PM By: Deon Pilling RN, BSN Entered By: Deon Pilling on 01/24/2022 08:04:26 -------------------------------------------------------------------------------- Multi Wound Chart Details Patient Name: Date of Service: [name] [name], [name] 01/24/2022 8:00 A M Medical Record Number:  [id number] Patient Account Number: [id number] Date of Birth/Sex: Treating RN: Aug 30, 1955 (66 y.o. F) Primary Care Dupree Givler: [name] Other Clinician: Referring Margherita Collyer: Treating Nykia Turko/Extender: [name] in Treatment: 16 Vital Signs Height(in): Capillary Blood Glucose(mg/dl): 117 Weight(lbs): Pulse(bpm): 62 Body Mass Index(BMI): Blood Pressure(mmHg): 140/76 Temperature(F): 98.1 Respiratory Rate(breaths/min): 20 [5:Photos:] [N/A:N/A] Right, Lateral Foot N/A N/A Wound Location: Blister N/A N/A Wounding Event: Diabetic Wound/Ulcer of the Lower N/A N/A Primary Etiology: Extremity  Sleep Apnea, Coronary Artery N/A N/A Comorbid History: Disease, Hypertension, Type II Diabetes, Neuropathy 11/22/2021 N/A N/A Date Acquired: 9 N/A N/A Weeks of Treatment: Healed - Epithelialized N/A N/A Wound Status: No N/A N/A Wound Recurrence: 0x0x0 N/A N/A Measurements L x W x D (cm) 0 N/A N/A A (cm) : rea 0 N/A N/A Volume (cm) : 100.00% N/A N/A % Reduction in A rea: 100.00% N/A N/A % Reduction in Volume: Grade 2 N/A N/A Classification: None Present N/A N/A Exudate A mount: Distinct, outline attached N/A N/A Wound Margin: None Present (0%) N/A N/A Granulation A mount: None Present (0%) N/A N/A Necrotic A mount: Fascia: No N/A N/A Exposed Structures: Fat Layer (Subcutaneous Tissue): No Tendon: No Muscle: No Joint: No Bone: No Large (67-100%) N/A N/A Epithelialization: Callus: Yes N/A N/A Periwound Skin Texture: Excoriation: No Induration: No Crepitus: No Rash: No Scarring: No Dry/Scaly: Yes N/A N/A Periwound Skin MoistureGerardo, Territo Dorie Raliegh Ip ([id number]) [id number] Page 5 of 8 Maceration: No Atrophie Blanche: No N/A N/A Periwound Skin Color: Cyanosis: No Ecchymosis: No Erythema: No Hemosiderin Staining: No Mottled: No Pallor: No Rubor: No No Abnormality N/A  N/A Temperature: Treatment Notes Electronic Signature(s) Signed: 01/24/2022 11:10:27 AM By: Kalman Shan DO Entered By: Kalman Shan on 01/24/2022 09:16:20 -------------------------------------------------------------------------------- Multi-Disciplinary Care Plan Details Patient Name: Date of Service: [name] [name], [name] 01/24/2022 8:00 A M Medical Record Number: [id number] Patient Account Number: [id number] Date of Birth/Sex: Treating RN: Jul 15, 1955 (66 y.o. [name], [name] Primary Care Marlen Mollica: [name] Other Clinician: Referring Adrick Kestler: Treating Sun Wilensky/Extender: [name] in Treatment: 16 Active Inactive Electronic Signature(s) Signed: 01/24/2022 3:46:46 PM By: Rhae Hammock RN Entered By: Rhae Hammock on 01/24/2022 08:34:03 -------------------------------------------------------------------------------- Pain Assessment Details Patient Name: Date of Service: [name] [name], [name] 01/24/2022 8:00 A M Medical Record Number: [id number] Patient Account Number: [id number] Date of Birth/Sex: Treating RN: 11-04-1955 (66 y.o. [name] Primary Care Sakina Briones: [name] Other Clinician: Referring Avonlea Sima: Treating Kori Goins/Extender: [name] in Treatment: 16 Active Problems Location of Pain Severity and Description of Pain Patient Has Paino No Site Locations Rate the pain. DAKOTA, STANGL ([id number]) [id number] Page 6 of 8 Rate the pain. Current Pain Level: 0 Pain Management and Medication Current Pain Management: Medication: No Cold Application: No Rest: No Massage: No Activity: No T.E.N.S.: No Heat Application: No Leg drop or elevation: No Is the Current Pain Management Adequate: Adequate How does your wound impact your activities of daily livingo Sleep: No Bathing: No Appetite: No Relationship With Others:  No Bladder Continence: No Emotions: No Bowel Continence: No Work: No Toileting: No Drive: No Dressing: No Hobbies: No Engineer, maintenance) Signed: 01/24/2022 4:59:17 PM By: Deon Pilling RN, BSN Entered By: Deon Pilling on 01/24/2022 08:04:16 -------------------------------------------------------------------------------- Patient/Caregiver Education Details Patient Name: Date of Service: [name] [name], [name] 12/4/2023andnbsp8:00 A M Medical Record Number: [id number] Patient Account Number: [id number] Date of Birth/Gender: Treating RN: 1955-06-23 (66 y.o. [name], [name] Primary Care Physician: [name] Other Clinician: Referring Physician: Treating Physician/Extender: [name] in Treatment: 16 Education Assessment Education Provided To: Patient Education Topics Provided Wound/Skin Impairment: Methods: Explain/Verbal Responses: Reinforcements needed, State content correctly Electronic Signature(s) Signed: 01/24/2022 3:46:46 PM By: Rhae Hammock RN Entered By: Rhae Hammock on 01/24/2022 08:34:14 Youngers, Kellyann K ([id number]) [id number] Page 7 of 8 -------------------------------------------------------------------------------- Wound Assessment Details Patient Name: Date of Service: [name] [name], [name]  01/24/2022 8:00 A M Medical Record Number: [id number] Patient Account Number: [id number] Date of Birth/Sex: Treating RN: 1955/08/23 (66 y.o. [name], [name] Primary Care Kymoni Monday: [name] Other Clinician: Referring Felica Chargois: Treating Giuliano Preece/Extender: [name] in Treatment: 16 Wound Status Wound Number: 5 Primary Diabetic Wound/Ulcer of the Lower Extremity Etiology: Wound Location: Right, Lateral Foot Wound Healed - Epithelialized Wounding Event: Blister Status: Date Acquired: 11/22/2021 Comorbid Sleep Apnea, Coronary Artery  Disease, Hypertension, Type II Weeks Of Treatment: 9 History: Diabetes, Neuropathy Clustered Wound: No Photos Wound Measurements Length: (cm) Width: (cm) Depth: (cm) Area: (cm) Volume: (cm) 0 % Reduction in Area: 100% 0 % Reduction in Volume: 100% 0 Epithelialization: Large (67-100%) 0 Tunneling: No 0 Undermining: No Wound Description Classification: Grade 2 Wound Margin: Distinct, outline attached Exudate Amount: None Present Foul Odor After Cleansing: No Slough/Fibrino No Wound Bed Granulation Amount: None Present (0%) Exposed Structure Necrotic Amount: None Present (0%) Fascia Exposed: No Fat Layer (Subcutaneous Tissue) Exposed: No Tendon Exposed: No Muscle Exposed: No Joint Exposed: No Bone Exposed: No Periwound Skin Texture Texture Color No Abnormalities Noted: No No Abnormalities Noted: Yes Callus: Yes Temperature / Pain Crepitus: No Temperature: No Abnormality Excoriation: No Induration: No Rash: No Scarring: No Moisture No Abnormalities Noted: No Dry / Scaly: Yes Maceration: No Electronic Signature(s) Dixie, Coppa Ixel K ([id number]) [phone].pdf Page 8 of 8 Signed: 01/24/2022 3:46:46 PM By: Rhae Hammock RN Entered By: Rhae Hammock on 01/24/2022 08:33:03 -------------------------------------------------------------------------------- Vitals Details Patient Name: Date of Service: [name] [name], [name] 01/24/2022 8:00 A M Medical Record Number: [id number] Patient Account Number: [id number] Date of Birth/Sex: Treating RN: February 09, 1956 (66 y.o. [name], [name] Primary Care Corra Kaine: [name] Other Clinician: Referring [name]: Treating Abbegayle Denault/Extender: [name] in Treatment: 16 Vital Signs Time Taken: 08:00 Temperature (F): 98.1 Pulse (bpm): 91 Respiratory Rate (breaths/min): 20 Blood Pressure (mmHg): 140/76 Capillary Blood Glucose (mg/dl): 117 Reference Range: 80  - 120 mg / dl Electronic Signature(s) Signed: 01/24/2022 4:59:17 PM By: Deon Pilling RN, BSN Entered By: Deon Pilling on 01/24/2022 08:04:08

## 2022-01-24 NOTE — Progress Notes (Addendum)
[name], [name] ([id number]) [id number] Page 1 of 8 Visit Report for 01/24/2022 Chief Complaint Document Details Patient Name: Date of Service: [name], [name] 01/24/2022 8:00 A M Medical Record Number: [id number] Patient Account Number: [id number] Date of Birth/Sex: Treating RN: 04-Feb-1956 (66 y.o. F) Primary Care Provider: [name] Other Clinician: Referring Provider: Treating Provider/Extender: [name] in Treatment: 16 Information Obtained from: Patient Chief Complaint 10/27; Right Plantar foot wound 11/21; 2 small areas of skin breakdown to the right foot following cast placement 8/29; right plantar foot wound Electronic Signature(s) Signed: 01/24/2022 11:10:27 AM By: Kalman Shan DO Entered By: Kalman Shan on 01/24/2022 09:16:27 -------------------------------------------------------------------------------- HPI Details Patient Name: Date of Service: [name], [name] 01/24/2022 8:00 A M Medical Record Number: [id number] Patient Account Number: [id number] Date of Birth/Sex: Treating RN: 11/22/1955 (66 y.o. F) Primary Care Provider: [name] Other Clinician: Referring Provider: Treating Provider/Extender: [name] in Treatment: 16 History of Present Illness HPI Description: Admission 12/17/2020 [name] is a 66 year old female with a past medical history of insulin-dependent type 2 diabetes, hypothyroidism and daily1 pack per day cigarette smoker the presents to the clinic for a 6-week history of nonhealing wound to the right first MTPJ. She has been following with [name], podiatry for this issue. She has been using silver alginate with dressing changes. She uses a postsurgical shoe and offloading pads. She currently denies signs of infection. 10/31; patient presents for follow-up. She tolerated the soft cast fine although  she states that she felt her foot rolling to one side. She denies signs of infection. She would like to do the total contact cast today. 12/23/2020 upon evaluation today patient appears to be doing excellent in regard to her wound on the foot and she is in a total contact cast. I do think this is appropriate this is the first cast change which we are obliged to do to ensure nothing is rubbing everything appears to be doing quite well and very pleased in that regard. 11/7; patient presents for follow-up. She had no issues with the cast. She denies signs of infection. 11/14; patient presents for 1 week follow-up. She has had no issues with the cast. She denies signs of infection. 11/21; patient presents for 1 week follow-up. She did develop 2 small areas of skin breakdown on either side of the right foot from the cast rubbing. She states she did not feel the wounds developing. She currently denies signs of infection. 11/28; patient presents for 1 week follow-up. She has no issues or complaints today. She denies pain or acute signs of infection. 12/12; patient presents for follow-up. She reports improvement to her right lateral foot wound. She has been using silver alginate to the area. She has no issues or complaints today. 12/19; patient presents for follow-up. She reports that her right lateral foot wound has healed. She has no issues or complaints today. 1/3; patient presents for follow-up. She has no issues or complaints today. She reports no open wounds. [name], [name] ([id number]) [id number] Page 2 of 8 Readmission 10/01/2021 [name] is a 66 year old female's with a past medical history of insulin-dependent controlled type 2 diabetes complicated by peripheral neuropathy that presents to the clinic for a 39-monthhistory of nonhealing ulcer to the right foot. I have seen her before for a wound to the same area that was treated and healed. Her wound today  however this  is much deeper and it has thick yellow drainage. She is not sure exactly how it started. She noticed it 1 day. She has been following with [name] for this issue. She had a wound culture that showed a mix of organisms on 09/01/2021. She was recently started on doxycycline. She is using the Conservation officer, nature. She has been using Iodosorb with dressing changes. She currently denies systemic signs of infection. 8/21; patient presents for follow-up. She states she started and completed levofloxacin. She is still taking doxycycline. She reports improvement in drainage and odor. She has been doing Dakin's wet-to-dry dressings as well. She is using her front offloading shoe. She denies signs of infection. 8/29; patient presents for follow-up. She is still taking doxycycline. She continues to report improvement in drainage and reports no odor. She denies any purulent drainage. She has been doing silver alginate to the wound bed and using her front offloading shoe. 9/5; patient presents for follow-up. She completed the course of levofloxacin. She has someone that is able to drive her today so we can place the total contact cast. She reports no signs of infection. We have been doing silver alginate to the wound bed. 9/7; patient presents for follow-up. She has tolerated the total contact cast well. She presents for her obligatory cast change. She has no issues or complaints today. 9/11; patient presents for follow-up. She tolerated the total contact cast well. She has no issues or complaints today. We discussed potentially doing a skin substitute and patient would like to see if her insurance will cover this. 9/18; patient presents for follow-up. She had no issues with the total contact cast. She been approved for Grafix and patient would like to have this placed today. 9/25; patient presents for follow-up. Grafix #1 was placed in standard fashion at last clinic visit. She has no issues or complaints  today. She tolerated the cast well. 10/2; patient presents for follow-up. Grafix #2 was placed in standard fashion at last clinic visit. Unfortunately she developed a wound to the lateral aspect of the foot over the past week. She states she has been on her feet a lot more and walking more. She denies systemic signs of infection. 10/9; patient presents for follow-up. Grafix #3 was placed in standard at last clinic visit. The plantar foot wound has healed with this. She developed a new wound to the lateral aspect last week and this has gotten larger. She reports soreness to this area. She denies systemic signs of infection. 10/16; patient presents for follow-up. Her plantar wound continues to remain closed. Her lateral wound has improved with the use of Hydrofera Blue. She has no issues or complaints today. She denies signs of infection. She has almost completed her course of antibiotics prescribed at last clinic visit. 10/23; patient presents for follow-up. She has been using Hydrofera Blue and Medihoney to the wound bed. She has no issues or complaints today. She has been using her surgical shoe. 10/30; patient presents for follow-up. We have been using Hydrofera Blue and Medihoney to the wound bed. Patient has no issues or complaints today. 11/10; patient presents for follow-up. She has been using Hydrofera Blue and Medihoney without issues. She has been wearing her Orthotics. It is unclear if she is offloading this area or not. 11/17; patient presents for follow-up. She has been using Hydrofera Blue and Medihoney to the foot wound without issues. Grafix was available today for placement and patient would like to proceed with this. 12/4; patient presents  for follow-up. Grafix was placed at last clinic visit. Her wound is healed. She has no issues or complaints today. She is reported no drainage from the previous wound site. Electronic Signature(s) Signed: 01/24/2022 11:10:27 AM By: Kalman Shan DO Entered By: Kalman Shan on 01/24/2022 09:16:55 -------------------------------------------------------------------------------- Physical Exam Details Patient Name: Date of Service: [name], [name] 01/24/2022 8:00 A M Medical Record Number: [id number] Patient Account Number: [id number] Date of Birth/Sex: Treating RN: 1955/09/04 (66 y.o. F) Primary Care Provider: [name] Other Clinician: Referring Provider: Treating Provider/Extender: [name] in Treatment: 16 Constitutional respirations regular, non-labored and within target range for patient.. Cardiovascular 2+ dorsalis pedis/posterior tibialis pulses. Psychiatric pleasant and cooperative. Notes T the lateral aspect of the right foot there is epithelialization to the previous wound site Pristine Gladhill, Ruben K ([id number]) [id number] Page 3 of 8 Electronic Signature(s) Signed: 01/24/2022 11:10:27 AM By: Kalman Shan DO Entered By: Kalman Shan on 01/24/2022 09:19:52 -------------------------------------------------------------------------------- Physician Orders Details Patient Name: Date of Service: [name], [name] 01/24/2022 8:00 A M Medical Record Number: [id number] Patient Account Number: [id number] Date of Birth/Sex: Treating RN: Oct 16, 1955 (66 y.o. [name], [name] Primary Care Provider: [name] Other Clinician: Referring Provider: Treating Provider/Extender: [name] in Treatment: 16 Verbal / Phone Orders: No Diagnosis Coding Discharge From [facility] Services Discharge from [place] Signature(s) Signed: 01/24/2022 11:10:27 AM By: Kalman Shan DO Entered By: Kalman Shan on 01/24/2022 09:19:59 -------------------------------------------------------------------------------- Problem List Details Patient Name: Date of Service: [name], A  [name] 01/24/2022 8:00 A M Medical Record Number: [id number] Patient Account Number: [id number] Date of Birth/Sex: Treating RN: 09-01-1955 (67 y.o. F) Primary Care Provider: [name] Other Clinician: Referring Provider: Treating Provider/Extender: [name] in Treatment: 16 Active Problems ICD-10 Encounter Code Description Active Date MDM Diagnosis L97.512 Non-pressure 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 10/01/2021 No Yes E11.621 Type 2 diabetes mellitus with foot ulcer 10/01/2021 No Yes E11.42 Type 2 diabetes mellitus with diabetic polyneuropathy 10/01/2021 No Yes Inactive Problems Resolved Problems Electronic Signature(s) Signed: 01/24/2022 11:10:27 AM By: Chiquita Loth, Clara K ([id number]) By: Kalman Shan DO [id number] Page 4 of 8 Signed: 01/24/2022 11:10:27 AM Entered By: Kalman Shan on 01/24/2022 09:16:15 -------------------------------------------------------------------------------- Progress Note Details Patient Name: Date of Service: [name], [name] 01/24/2022 8:00 A M Medical Record Number: [id number] Patient Account Number: [id number] Date of Birth/Sex: Treating RN: 1955-06-20 (66 y.o. F) Primary Care Provider: [name] Other Clinician: Referring Provider: Treating Provider/Extender: [name] in Treatment: 16 Subjective Chief Complaint Information obtained from Patient 10/27; Right Plantar foot wound 11/21; 2 small areas of skin breakdown to the right foot following cast placement 8/29; right plantar foot wound History of Present Illness (HPI) Admission 12/17/2020 [name] is a 66 year old female with a past medical history of insulin-dependent type 2 diabetes, hypothyroidism and daily1 pack per day cigarette smoker the presents to the clinic for a 6-week history of nonhealing wound  to the right first MTPJ. She has been following with [name], podiatry for this issue. She has been using silver alginate with dressing changes. She uses a postsurgical shoe and offloading pads. She currently denies signs of infection. 10/31; patient presents for follow-up. She tolerated the soft cast fine although she states that she felt her foot rolling to one  side. She denies signs of infection. She would like to do the total contact cast today. 12/23/2020 upon evaluation today patient appears to be doing excellent in regard to her wound on the foot and she is in a total contact cast. I do think this is appropriate this is the first cast change which we are obliged to do to ensure nothing is rubbing everything appears to be doing quite well and very pleased in that regard. 11/7; patient presents for follow-up. She had no issues with the cast. She denies signs of infection. 11/14; patient presents for 1 week follow-up. She has had no issues with the cast. She denies signs of infection. 11/21; patient presents for 1 week follow-up. She did develop 2 small areas of skin breakdown on either side of the right foot from the cast rubbing. She states she did not feel the wounds developing. She currently denies signs of infection. 11/28; patient presents for 1 week follow-up. She has no issues or complaints today. She denies pain or acute signs of infection. 12/12; patient presents for follow-up. She reports improvement to her right lateral foot wound. She has been using silver alginate to the area. She has no issues or complaints today. 12/19; patient presents for follow-up. She reports that her right lateral foot wound has healed. She has no issues or complaints today. 1/3; patient presents for follow-up. She has no issues or complaints today. She reports no open wounds. Readmission 10/01/2021 [name] is a 66 year old female's with a past medical history of insulin-dependent controlled type  2 diabetes complicated by peripheral neuropathy that presents to the clinic for a 30-monthhistory of nonhealing ulcer to the right foot. I have seen her before for a wound to the same area that was treated and healed. Her wound today however this is much deeper and it has thick yellow drainage. She is not sure exactly how it started. She noticed it 1 day. She has been following with Dr. EAmalia Haileyfor this issue. She had a wound culture that showed a mix of organisms on 09/01/2021. She was recently started on doxycycline. She is using the dConservation officer, nature She has been using Iodosorb with dressing changes. She currently denies systemic signs of infection. 8/21; patient presents for follow-up. She states she started and completed levofloxacin. She is still taking doxycycline. She reports improvement in drainage and odor. She has been doing Dakin's wet-to-dry dressings as well. She is using her front offloading shoe. She denies signs of infection. 8/29; patient presents for follow-up. She is still taking doxycycline. She continues to report improvement in drainage and reports no odor. She denies any purulent drainage. She has been doing silver alginate to the wound bed and using her front offloading shoe. 9/5; patient presents for follow-up. She completed the course of levofloxacin. She has someone that is able to drive her today so we can place the total contact cast. She reports no signs of infection. We have been doing silver alginate to the wound bed. 9/7; patient presents for follow-up. She has tolerated the total contact cast well. She presents for her obligatory cast change. She has no issues or complaints today. 9/11; patient presents for follow-up. She tolerated the total contact cast well. She has no issues or complaints today. We discussed potentially doing a skin substitute and patient would like to see if her insurance will cover this. 9/18; patient presents for follow-up. She had no issues with the  total contact cast. She been approved for Grafix  and patient would like to have this placed today. 9/25; patient presents for follow-up. Grafix #1 was placed in standard fashion at last clinic visit. She has no issues or complaints today. She tolerated the cast well. 10/2; patient presents for follow-up. Grafix #2 was placed in standard fashion at last clinic visit. Unfortunately she developed a wound to the lateral aspect of the foot over the past week. She states she has been on her feet a lot more and walking more. She denies systemic signs of infection. LATANGA, NEDROW ([id number]) [id number] Page 5 of 8 10/9; patient presents for follow-up. Grafix #3 was placed in standard at last clinic visit. The plantar foot wound has healed with this. She developed a new wound to the lateral aspect last week and this has gotten larger. She reports soreness to this area. She denies systemic signs of infection. 10/16; patient presents for follow-up. Her plantar wound continues to remain closed. Her lateral wound has improved with the use of Hydrofera Blue. She has no issues or complaints today. She denies signs of infection. She has almost completed her course of antibiotics prescribed at last clinic visit. 10/23; patient presents for follow-up. She has been using Hydrofera Blue and Medihoney to the wound bed. She has no issues or complaints today. She has been using her surgical shoe. 10/30; patient presents for follow-up. We have been using Hydrofera Blue and Medihoney to the wound bed. Patient has no issues or complaints today. 11/10; patient presents for follow-up. She has been using Hydrofera Blue and Medihoney without issues. She has been wearing her Orthotics. It is unclear if she is offloading this area or not. 11/17; patient presents for follow-up. She has been using Hydrofera Blue and Medihoney to the foot wound without issues. Grafix was available today for placement  and patient would like to proceed with this. 12/4; patient presents for follow-up. Grafix was placed at last clinic visit. Her wound is healed. She has no issues or complaints today. She is reported no drainage from the previous wound site. Patient History Information obtained from Patient, Chart. Family History Unknown History. Social History Current every day smoker - 1 pack/a/day, Alcohol Use - Never, Drug Use - No History, Caffeine Use - Moderate. Medical History Eyes Denies history of Cataracts, Glaucoma, Optic Neuritis Ear/Nose/Mouth/Throat Denies history of Chronic sinus problems/congestion, Middle ear problems Hematologic/Lymphatic Denies history of Anemia, Hemophilia, Human Immunodeficiency Virus, Lymphedema, Sickle Cell Disease Respiratory Patient has history of Sleep Apnea Denies history of Aspiration, Asthma, Chronic Obstructive Pulmonary Disease (COPD), Pneumothorax, Tuberculosis Cardiovascular Patient has history of Coronary Artery Disease, Hypertension Denies history of Angina, Arrhythmia, Congestive Heart Failure, Deep Vein Thrombosis, Hypotension, Myocardial Infarction, Peripheral Arterial Disease, Peripheral Venous Disease, Phlebitis, Vasculitis Gastrointestinal Denies history of Cirrhosis , Colitis, Crohnoos, Hepatitis A, Hepatitis B, Hepatitis C Endocrine Patient has history of Type II Diabetes Denies history of Type I Diabetes Genitourinary Denies history of End Stage Renal Disease Immunological Denies history of Lupus Erythematosus, Raynaudoos, Scleroderma Integumentary (Skin) Denies history of History of Burn Musculoskeletal Denies history of Gout, Rheumatoid Arthritis, Osteoarthritis, Osteomyelitis Neurologic Patient has history of Neuropathy Denies history of Dementia, Quadriplegia, Paraplegia, Seizure Disorder Hospitalization/Surgery History - 2nd and 3rd right toe amps. - total hysterectomy. - cholecystectomy. - 2 knee surgery's on left and 1 on  right. Medical A Surgical History Notes nd Cardiovascular hypercholesterolemia Objective Constitutional respirations regular, non-labored and within target range for patient.. Vitals Time Taken: 8:00 AM, Temperature: 98.1 F, Pulse: 91 bpm, Respiratory Rate: 20  breaths/min, Blood Pressure: 140/76 mmHg, Capillary Blood Glucose: 117 mg/dl. Cardiovascular 2+ dorsalis pedis/posterior tibialis pulses. Psychiatric pleasant and cooperative. JACIE, TRISTAN ([id number]) [id number] Page 6 of 8 General Notes: T the lateral aspect of the right foot there is epithelialization to the previous wound site o Integumentary (Hair, Skin) Wound #5 status is Healed - Epithelialized. Original cause of wound was Blister. The date acquired was: 11/22/2021. The wound has been in treatment 9 weeks. The wound is located on the Right,Lateral Foot. The wound measures 0cm length x 0cm width x 0cm depth; 0cm^2 area and 0cm^3 volume. There is no tunneling or undermining noted. There is a none present amount of drainage noted. The wound margin is distinct with the outline attached to the wound base. There is no granulation within the wound bed. There is no necrotic tissue within the wound bed. The periwound skin appearance had no abnormalities noted for color. The periwound skin appearance exhibited: Callus, Dry/Scaly. The periwound skin appearance did not exhibit: Crepitus, Excoriation, Induration, Rash, Scarring, Maceration. Periwound temperature was noted as No Abnormality. Assessment Active Problems ICD-10 Non-pressure 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 Type 2 diabetes mellitus with foot ulcer Type 2 diabetes mellitus with diabetic polyneuropathy Patient did well with Grafix. Her wound is healed. She states she has follow-up with podiatry this week. I recommended orthotics. She may follow-up as needed. Plan Discharge From [facility] Services: Discharge from  [place] 1. Discharge from clinic due to closed wound 2. Follow-up with podiatry Electronic Signature(s) Signed: 01/24/2022 11:10:27 AM By: Kalman Shan DO Entered By: Kalman Shan on 01/24/2022 09:21:34 -------------------------------------------------------------------------------- HxROS Details Patient Name: Date of Service: [name], [name] 01/24/2022 8:00 A M Medical Record Number: [id number] Patient Account Number: [id number] Date of Birth/Sex: Treating RN: [birth year] (66 y.o. F) Primary Care Provider: [name] Other Clinician: Referring Provider: Treating Provider/Extender: [name] in Treatment: 16 Information Obtained From Patient Chart Eyes Medical History: Negative for: Cataracts; Glaucoma; Optic Neuritis Ear/Nose/Mouth/Throat Medical History: Negative for: Chronic sinus problems/congestion; Middle ear problems Hematologic/Lymphatic [name], [name] ([id number]) [id number] Page 7 of 8 Medical History: Negative for: Anemia; Hemophilia; Human Immunodeficiency Virus; Lymphedema; Sickle Cell Disease Respiratory Medical History: Positive for: Sleep Apnea Negative for: Aspiration; Asthma; Chronic Obstructive Pulmonary Disease (COPD); Pneumothorax; Tuberculosis Cardiovascular Medical History: Positive for: Coronary Artery Disease; Hypertension Negative for: Angina; Arrhythmia; Congestive Heart Failure; Deep Vein Thrombosis; Hypotension; Myocardial Infarction; Peripheral Arterial Disease; Peripheral Venous Disease; Phlebitis; Vasculitis Past Medical History Notes: hypercholesterolemia Gastrointestinal Medical History: Negative for: Cirrhosis ; Colitis; Crohns; Hepatitis A; Hepatitis B; Hepatitis C Endocrine Medical History: Positive for: Type II Diabetes Negative for: Type I Diabetes Genitourinary Medical History: Negative for: End Stage Renal  Disease Immunological Medical History: Negative for: Lupus Erythematosus; Raynauds; Scleroderma Integumentary (Skin) Medical History: Negative for: History of Burn Musculoskeletal Medical History: Negative for: Gout; Rheumatoid Arthritis; Osteoarthritis; Osteomyelitis Neurologic Medical History: Positive for: Neuropathy Negative for: Dementia; Quadriplegia; Paraplegia; Seizure Disorder Immunizations Pneumococcal Vaccine: Received Pneumococcal Vaccination: Yes Received Pneumococcal Vaccination On or After 60th Birthday: Yes Tetanus Vaccine: Last tetanus shot: 12/17/2020 Implantable Devices No devices added Hospitalization / Surgery History Type of Hospitalization/Surgery 2nd and 3rd right toe amps total hysterectomy cholecystectomy 2 knee surgery's on left and 1 on right Family and Social History Unknown History: Yes; Current every day smoker - 1 pack/a/day; Alcohol Use: Never; Drug Use: No History; Caffeine Use: Moderate; Financial Concerns: No; Food, Games developer or Shelter  Needs: No; Support System Lacking: No; Transportation Concerns: No RUDENE, POULSEN ([id number]) [id number] Page 8 of 8 Electronic Signature(s) Signed: 01/24/2022 11:10:27 AM By: Kalman Shan DO Entered By: Kalman Shan on 01/24/2022 09:17:02 -------------------------------------------------------------------------------- SuperBill Details Patient Name: Date of Service: [name], [name] 01/24/2022 Medical Record Number: [id number] Patient Account Number: [id number] Date of Birth/Sex: Treating RN: 28-Jun-1955 (66 y.o. F) Primary Care Provider: [name] Other Clinician: Referring Provider: Treating Provider/Extender: [name] in Treatment: 16 Diagnosis Coding ICD-10 Codes Code Description [phone] Non-pressure 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 E11.621 Type 2 diabetes mellitus with foot  ulcer E11.42 Type 2 diabetes mellitus with diabetic polyneuropathy Facility Procedures : CPT4 Code: [id number] Description: 99214 - WOUND CARE VISIT-LEV 4 EST PT Modifier: Quantity: 1 Physician Procedures : CPT4 Code Description Modifier [id number] - WC PHYS LEVEL 3 - EST PT ICD-10 Diagnosis Description L97.512 Non-pressure 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 E11.621 Type 2 diabetes mellitus with foot ulcer E11.42 Type 2  diabetes mellitus with diabetic polyneuropathy Quantity: 1 Electronic Signature(s) Signed: 02/15/2022 3:20:18 PM By: Kalman Shan DO Signed: 02/23/2022 5:35:40 PM By: [name] Hammock RN Previous Signature: 01/24/2022 11:10:27 AM Version By: Kalman Shan DO Entered By: [name] on 02/04/2022 10:58:50

## 2022-01-26 ENCOUNTER — Ambulatory Visit (INDEPENDENT_AMBULATORY_CARE_PROVIDER_SITE_OTHER): Payer: Medicare Other | Admitting: Podiatry

## 2022-01-26 ENCOUNTER — Encounter: Payer: Self-pay | Admitting: Podiatry

## 2022-01-26 VITALS — BP 154/81 | HR 99

## 2022-01-26 DIAGNOSIS — M79675 Pain in left toe(s): Secondary | ICD-10-CM | POA: Diagnosis not present

## 2022-01-26 DIAGNOSIS — B351 Tinea unguium: Secondary | ICD-10-CM | POA: Diagnosis not present

## 2022-01-26 DIAGNOSIS — L97512 Non-pressure 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: Secondary | ICD-10-CM | POA: Diagnosis not present

## 2022-01-26 DIAGNOSIS — M79674 Pain in right toe(s): Secondary | ICD-10-CM | POA: Diagnosis not present

## 2022-01-26 DIAGNOSIS — E0843 Diabetes mellitus due to underlying condition with diabetic autonomic (poly)neuropathy: Secondary | ICD-10-CM | POA: Diagnosis not present

## 2022-01-26 NOTE — Progress Notes (Signed)
Chief Complaint  Patient presents with   foot care    Patient is here for diabetic foot care.    Subjective:  66 y.o. female with PMHx of diabetes mellitus presenting today for routine footcare.  Patient has been seen and treated at the [facility] wound care center for ulcers to the foot specifically the plantar aspect of the first MTP.  Patient states that the wound to the plantar aspect of the first MTP has healed.  She presents today for routine footcare  Past Medical History:  Diagnosis Date   Anxiety    Arthritis    knees   Coronary artery disease    Depression    Fatty liver    History of nuclear stress test 12/16/2011   exercise myoview; normal images with 2-61m ST-segment depression - subsequent cath revelaed subtotally occluded small 2nd marginal branch & 75% PDA lesion, normal LV function   Hypertension    Hypothyroidism    Neuropathy    in feet   OSA on CPAP    AHI = 44 (per patient) setting 13 per pt   Personal history of kidney stones    Primary localized osteoarthritis of left knee 04/24/2015   Primary localized osteoarthritis of right knee 07/02/2015   Tobacco abuse    Type 2 diabetes mellitus (HCC)    insulin pump   Past Surgical History:  Procedure Laterality Date   3rd right toe amputation  02/2016   AMPUTATION Right 12/22/2014   Procedure: RIGHT 2ND TOE AMPUTATION;  Surgeon: JWylene Simmer MD;  [place]  Service: [place]opedics;  Laterality: Right;   AUGMENTATION MAMMAPLASTY Bilateral    BREAST ENHANCEMENT SURGERY Bilateral    CARDIAC CATHETERIZATION  01/04/2012   subtotally occluded small 2nd marginal branch & 75% PDA lesion, normal LV function   CATARACT EXTRACTION Bilateral    with lens implants   CHOLECYSTECTOMY  2008   COLONOSCOPY     COLONOSCOPY WITH PROPOFOL N/A 05/12/2016   Procedure: COLONOSCOPY WITH PROPOFOL;  Surgeon: JJuanita Craver MD;  Location: [facility];  Service: Endoscopy;  Laterality: N/A;   I & D KNEE WITH POLY EXCHANGE Left  05/13/2015   Procedure: IRRIGATION AND DEBRIDEMENT KNEE WITH POLY EXCHANGE;  Surgeon: JMarchia Bond MD;  [place]  Service: Orthopedics;  Laterality: Left;   LEFT HEART CATHETERIZATION WITH CORONARY ANGIOGRAM N/A 01/04/2012   Procedure: LEFT HEART CATHETERIZATION WITH CORONARY ANGIOGRAM;  Surgeon: JLorretta Harp MD;  [facility]  Service: Cardiovascular;  Laterality: N/A;   PARTIAL KNEE ARTHROPLASTY Left 04/24/2015   Procedure: LEFT UNICOMPARTMENTAL KNEE ARTHROPLASTY ;  Surgeon: JMarchia Bond MD;  [place]  Service: [place]ics;  Laterality: Left;   PARTIAL KNEE ARTHROPLASTY Right 07/02/2015   Procedure: RIGHT UNI KNEE ARTHROPLASTY;  Surgeon: JMarchia Bond MD;  [place]  Service: Orthopedics;  Laterality: Right;  ANESTHESIA:  GENERAL, PRE/POST OP FEMORAL NERVE   POSTERIOR CERVICAL FUSION/FORAMINOTOMY  1990   ROBOTIC ASSISTED TOTAL HYSTERECTOMY WITH BILATERAL SALPINGO OOPHERECTOMY Bilateral 02/17/2015   Procedure: ROBOTIC ASSISTED TOTAL HYSTERECTOMY WITH BILATERAL SALPINGO OOPHORECTOMY;  Surgeon: EEveritt Amber MD;  Location: [facility];  Service: Gynecology;  Laterality: Bilateral;   Allergies  Allergen Reactions   Penicillins Hives    Has patient had a PCN reaction causing immediate rash, facial/tongue/throat swelling, SOB or lightheadedness with hypotension: Unknown Has patient had a PCN reaction causing severe rash involving mucus membranes or skin necrosis: Unknown Has patient had a PCN  reaction that required hospitalization: Unknown Has patient had a PCN reaction occurring within the last 10 years: No If all of the above answers are "NO", then may proceed with Cephalosporin use.     Objective/Physical Exam General: The patient is alert and oriented x3 in no acute distress.  Dermatology:  Hyperkeratotic dystrophic elongated nails noted 1-5 bilateral.  Ulcer also noted to the lateral aspect of the right foot measuring  approximately 0.8 x 0.7 x 0.2 cm.  Granular wound base.  There is no exposed bone muscle tendon ligament or joint.  No malodor.  Serous drainage.  Vascular: Palpable pedal pulses bilaterally.  Erythema with edema noted localized around the first MTP joint of the right foot capillary refill within normal limits.  Currently there is no concern clinically for vascular compromise  Neurological: Epicritic and protective threshold absent bilaterally.   Musculoskeletal Exam: History of prior toe amputations 2, 3 RT foot.   Radiographic exam RT foot 09/01/2021: Prior amputations of the second and third digit at the level of the MTP joint noted.  Diffuse degenerative changes with advanced DJD noted throughout the joints of the foot.  Irregularity and sclerosis noted of the sesamoidal apparatus as best as can be visualized.  No acute fractures.  No obvious sign of erosions or cortical irregularities that would be concerning for osteomyelitis.  MR FOOT RT WO CONTRAST 09/04/2021: IMPRESSION: 1. No osteomyelitis of the right forefoot. Soft tissue wound along the plantar aspect of the first MTP joint with surrounding soft tissue edema most consistent with cellulitis. Small amount of fluid superficial to the first MTP joint which may reflect a small abscess measuring 2 x 20 mm. 2. Hallux valgus of the right foot. Moderate osteoarthritis of the first MTP joint. 3. Osteoarthritis of the second through fifth tarsometatarsal joints as described above. 4. Moderate osteoarthritis of the navicular-medial cuneiform joint with subchondral reactive marrow edema.  Assessment: 1.  Ulcer lateral aspect of the fifth MTP right secondary to diabetes mellitus  2. diabetes mellitus w/ peripheral neuropathy 3.  Pain due to onychomycosis of toenails both   Plan of Care:  1. Patient was evaluated.  Patient continues to have an ulcer to the lateral aspect of his MTP right foot 2. medically necessary excisional debridement  including subcutaneous tissue was performed using a tissue nipper and a chisel blade. Excisional debridement of all the necrotic nonviable tissue down to healthy bleeding viable tissue was performed with post-debridement measurements same as pre-. 3. the wound was cleansed and dry sterile dressing applied. 4.  Mechanical debridement of nails bilateral was performed using nail nipper without incident or bleeding 5.  Patient is going to follow-up at the [facility] wound care center for the ulcer of the right foot 6.  Return to clinic 3 months for routine footcare  Edrick Kins, DPM [facility]  Dr. Edrick Kins, DPM    [address], [place] [address]                Office [phone]  Fax [phone]

## 2022-01-28 ENCOUNTER — Encounter (HOSPITAL_BASED_OUTPATIENT_CLINIC_OR_DEPARTMENT_OTHER): Payer: Medicare Other | Admitting: Internal Medicine

## 2022-01-28 DIAGNOSIS — L97512 Non-pressure 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: Secondary | ICD-10-CM | POA: Diagnosis not present

## 2022-01-28 DIAGNOSIS — F17210 Nicotine dependence, cigarettes, uncomplicated: Secondary | ICD-10-CM | POA: Diagnosis not present

## 2022-01-28 DIAGNOSIS — E1142 Type 2 diabetes mellitus with diabetic polyneuropathy: Secondary | ICD-10-CM | POA: Diagnosis not present

## 2022-01-28 DIAGNOSIS — Z794 Long term (current) use of insulin: Secondary | ICD-10-CM | POA: Diagnosis not present

## 2022-01-28 DIAGNOSIS — E11621 Type 2 diabetes mellitus with foot ulcer: Secondary | ICD-10-CM | POA: Diagnosis not present

## 2022-01-29 NOTE — Progress Notes (Signed)
[name], [name] ([id number]) [id number] Page 1 of 11 Visit Report for 01/28/2022 Chief Complaint Document Details Patient Name: Date of Service: [name], [name] 01/28/2022 9:00 [name] Medical Record Number: [id number] Patient Account Number: [id number] Date of Birth/Sex: Treating RN: 02/25/1955 (66 y.o. F) Primary Care Provider: [name] Other Clinician: Referring Provider: Treating Provider/Extender: [name] in Treatment: 17 Information Obtained from: Patient Chief Complaint 10/27; Right Plantar foot wound 11/21; 2 small areas of skin breakdown to the right foot following cast placement 8/29; right plantar foot wound Electronic Signature(s) Signed: 01/28/2022 11:11:46 AM By: Kalman Shan DO Entered By: Kalman Shan on 01/28/2022 10:40:59 -------------------------------------------------------------------------------- Cellular or Tissue Based Product Details Patient Name: Date of Service: [name], [name] 01/28/2022 9:00 [name] Medical Record Number: [id number] Patient Account Number: [id number] Date of Birth/Sex: Treating RN: 10-Jan-1956 (66 y.o. [name], [name] Primary Care Provider: [name] Other Clinician: Referring Provider: Treating Provider/Extender: [name] in Treatment: 17 Cellular or Tissue Based Product Type Wound #5R Right,Lateral Foot Applied to: Performed By: Physician Kalman Shan, DO Cellular or Tissue Based Product Type: Grafix prime Level of Consciousness (Pre-procedure): Awake and Alert Pre-procedure Verification/Time Out Yes - 09:40 Taken: Location: genitalia / hands / feet / multiple digits Wound Size (sq cm): 0.03 Product Size (sq cm): 6 Waste Size (sq cm): 4 Waste Reason: wound size Amount of Product Applied (sq cm): 2 Instrument Used: Forceps, Scissors Lot #: [id number] Order #: 5 Expiration Date:  05/30/2023 Fenestrated: No Reconstituted: Yes Solution Type: Normal saline Solution Amount: 20m Lot #: [id number] Expiration Date: 06/20/2024 Secured: Yes Secured With: Steri-Strips, adaptic Dressing Applied: No Procedural Pain: 0 Post Procedural Pain: 0 MSanyiah, KanzlerADRIENNE [name] ([id number] [phone] Page 2 of 11 Response to Treatment: Procedure was tolerated well Level of Consciousness (Post- Awake and Alert procedure): Post Procedure Diagnosis Same as Pre-procedure Electronic Signature(s) Signed: 01/28/2022 11:11:46 AM By: HKalman ShanDO Signed: 01/28/2022 3:28:23 PM By: DDeon PillingRN, BSN Entered By: DDeon Pillingon 01/28/2022 09:50:32 -------------------------------------------------------------------------------- Debridement Details Patient Name: Date of Service: [name] 01/28/2022 9:00 [name] Medical Record Number: [id number] Account Number: [id number] of Birth/Sex: Treating RN: [birth year]((66y.o. FHelene Shoe BMeta.RedingPrimary Care Provider: AGeoffery LyonsOther Clinician: Referring Provider: Treating Provider/Extender: HBaird Kayin Treatment: 17 Debridement Performed for Assessment: Wound #5R Right,Lateral Foot Performed By: Physician HKalman Shan DO Debridement Type: Debridement Severity of Tissue Pre Debridement: Fat layer exposed Level of Consciousness (Pre-procedure): Awake and Alert Pre-procedure Verification/Time Out Yes - 09:35 Taken: Start Time: 09:36 Pain Control: Lidocaine 4% T opical Solution T Area Debrided (L x W): otal 0.3 (cm) x 0.1 (cm) = 0.03 (cm) Tissue and other material debrided: Viable, Non-Viable, Slough, Subcutaneous, Slough Level: Skin/Subcutaneous Tissue Debridement Description: Excisional Instrument: Curette Bleeding: Minimum End Time: 09:40 Procedural Pain: 0 Post Procedural Pain: 0 Response to Treatment: Procedure was tolerated  well Level of Consciousness (Post- Awake and Alert procedure): Post Debridement Measurements of Total Wound Length: (cm) 0.3 Width: (cm) 0.1 Depth: (cm) 0.3 Volume: (cm) 0.007 Character of Wound/Ulcer Post Debridement: Improved Severity of Tissue Post Debridement: Fat layer exposed Post Procedure Diagnosis Same as Pre-procedure Electronic Signature(s) Signed: 01/28/2022 11:11:46 AM By: HKalman ShanDO Signed: 01/28/2022 3:28:23 PM By: DDeon PillingRN, BSN Entered By: DDeon Pillingon 01/28/2022 09:48:58 [name], [name] ([id number] Page 3 of 11 --------------------------------------------------------------------------------  HPI Details Patient Name: Date of Service: [name], [name] 01/28/2022 9:00 [name] Medical Record Number: [id number] Patient Account Number: [id number] Date of Birth/Sex: Treating RN: March 27, 1955 (66 y.o. F) Primary Care Provider: [name] Other Clinician: Referring Provider: Treating Provider/Extender: [name] in Treatment: 17 History of Present Illness HPI Description: Admission 12/17/2020 [name] is [name] 66 year old female with [name] past medical history of insulin-dependent type 2 diabetes, hypothyroidism and daily1 pack per day cigarette smoker the presents to the clinic for [name] 6-week history of nonhealing wound to the right first MTPJ. She has been following with [name], podiatry for this issue. She has been using silver alginate with dressing changes. She uses [name] postsurgical shoe and offloading pads. She currently denies signs of infection. 10/31; patient presents for follow-up. She tolerated the soft cast fine although she states that she felt her foot rolling to one side. She denies signs of infection. She would like to do the total contact cast today. 12/23/2020 upon evaluation today patient appears to be doing excellent in regard to her wound on the  foot and she is in [name] total contact cast. I do think this is appropriate this is the first cast change which we are obliged to do to ensure nothing is rubbing everything appears to be doing quite well and very pleased in that regard. 11/7; patient presents for follow-up. She had no issues with the cast. She denies signs of infection. 11/14; patient presents for 1 week follow-up. She has had no issues with the cast. She denies signs of infection. 11/21; patient presents for 1 week follow-up. She did develop 2 small areas of skin breakdown on either side of the right foot from the cast rubbing. She states she did not feel the wounds developing. She currently denies signs of infection. 11/28; patient presents for 1 week follow-up. She has no issues or complaints today. She denies pain or acute signs of infection. 12/12; patient presents for follow-up. She reports improvement to her right lateral foot wound. She has been using silver alginate to the area. She has no issues or complaints today. 12/19; patient presents for follow-up. She reports that her right lateral foot wound has healed. She has no issues or complaints today. 1/3; patient presents for follow-up. She has no issues or complaints today. She reports no open wounds. Readmission 10/01/2021 Ms. Alletta Mattos is [name] 66 year old female's with [name] past medical history of insulin-dependent controlled type 2 diabetes complicated by peripheral neuropathy that presents to the clinic for [name] 56-monthhistory of nonhealing ulcer to the right foot. I have seen her before for [name] wound to the same area that was treated and healed. Her wound today however this is much deeper and it has thick yellow drainage. She is not sure exactly how it started. She noticed it 1 day. She has been following with Dr. EAmalia Haileyfor this issue. She had [name] wound culture that showed [name] mix of organisms on 09/01/2021. She was recently started on doxycycline. She is using the dConservation officer, nature  She has been using Iodosorb with dressing changes. She currently denies systemic signs of infection. 8/21; patient presents for follow-up. She states she started and completed levofloxacin. She is still taking doxycycline. She reports improvement in drainage and odor. She has been doing Dakin's wet-to-dry dressings as well. She is using her front offloading shoe. She denies signs of infection. 8/29; patient presents for follow-up. She is still taking doxycycline.  She continues to report improvement in drainage and reports no odor. She denies any purulent drainage. She has been doing silver alginate to the wound bed and using her front offloading shoe. 9/5; patient presents for follow-up. She completed the course of levofloxacin. She has someone that is able to drive her today so we can place the total contact cast. She reports no signs of infection. We have been doing silver alginate to the wound bed. 9/7; patient presents for follow-up. She has tolerated the total contact cast well. She presents for her obligatory cast change. She has no issues or complaints today. 9/11; patient presents for follow-up. She tolerated the total contact cast well. She has no issues or complaints today. We discussed potentially doing [name] skin substitute and patient would like to see if her insurance will cover this. 9/18; patient presents for follow-up. She had no issues with the total contact cast. She been approved for Grafix and patient would like to have this placed today. 9/25; patient presents for follow-up. Grafix #1 was placed in standard fashion at last clinic visit. She has no issues or complaints today. She tolerated the cast well. 10/2; patient presents for follow-up. Grafix #2 was placed in standard fashion at last clinic visit. Unfortunately she developed [name] wound to the lateral aspect of the foot over the past week. She states she has been on her feet [name] lot more and walking more. She denies systemic signs of  infection. 10/9; patient presents for follow-up. Grafix #3 was placed in standard at last clinic visit. The plantar foot wound has healed with this. She developed [name] new wound to the lateral aspect last week and this has gotten larger. She reports soreness to this area. She denies systemic signs of infection. 10/16; patient presents for follow-up. Her plantar wound continues to remain closed. Her lateral wound has improved with the use of Hydrofera Blue. She has no issues or complaints today. She denies signs of infection. She has almost completed her course of antibiotics prescribed at last clinic visit. 10/23; patient presents for follow-up. She has been using Hydrofera Blue and Medihoney to the wound bed. She has no issues or complaints today. She has been using her surgical shoe. 10/30; patient presents for follow-up. We have been using Hydrofera Blue and Medihoney to the wound bed. Patient has no issues or complaints today. 11/10; patient presents for follow-up. She has been using Hydrofera Blue and Medihoney without issues. She has been wearing her Orthotics. It is unclear if she is offloading this area or not. 11/17; patient presents for follow-up. She has been using Hydrofera Blue and Medihoney to the foot wound without issues. Grafix was available today for placement and patient would like to proceed with this. [name], [name] ([id number]) [id number] Page 4 of 11 12/4; patient presents for follow-up. Grafix was placed at last clinic visit. Her wound is healed. She has no issues or complaints today. She is reported no drainage from the previous wound site. 12/8; patient states that her podiatrist removed [name] callus on the previous wound site to the lateral right foot and there is [name] wound present. She currently denies signs of infection. Electronic Signature(s) Signed: 01/28/2022 11:11:46 AM By: Kalman Shan DO Entered By: Kalman Shan on 01/28/2022  10:41:54 -------------------------------------------------------------------------------- Physical Exam Details Patient Name: Date of Service: [name], [name] 01/28/2022 9:00 [name] Medical Record Number: [id number] Patient Account Number: [id number] Date of Birth/Sex: Treating RN: 10-12-1955 (66 y.o. F) Primary Care Provider:  [name] Other Clinician: Referring Provider: Treating Provider/Extender: [name] in Treatment: 17 Constitutional respirations regular, non-labored and within target range for patient.. Cardiovascular 2+ dorsalis pedis/posterior tibialis pulses. Psychiatric pleasant and cooperative. Notes T the lateral aspect of the right foot there is [name] small open wound with granulation tissue and nonviable tissue. No surrounding signs of infection including o increased warmth, erythema or purulent drainage. Electronic Signature(s) Signed: 01/28/2022 11:11:46 AM By: Kalman Shan DO Entered By: Kalman Shan on 01/28/2022 10:42:44 -------------------------------------------------------------------------------- Physician Orders Details Patient Name: Date of Service: [name], [name] 01/28/2022 9:00 [name] Medical Record Number: [id number] Patient Account Number: [id number] Date of Birth/Sex: Treating RN: Apr 23, 1955 (66 y.o. [name], [name] Primary Care Provider: [name] Other Clinician: Referring Provider: Treating Provider/Extender: [name] in Treatment: [phone] Verbal / Phone Orders: No Diagnosis Coding ICD-10 Coding Code Description L97.512 Non-pressure 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 E11.621 Type 2 diabetes mellitus with foot ulcer E11.42 Type 2 diabetes mellitus with diabetic polyneuropathy Follow-up Appointments ppointment in 1 week. - Thursday 02/03/2022 230pm with Dr. Heber [place] Return [name] Reinders, Marybella [name] ([id number])  [id number] Page 5 of 11 Anesthetic (In clinic) Topical Lidocaine 4% applied to wound bed Cellular or Tissue Based Products Cellular or Tissue Based Product Type: - Run IVR for grafix 12/30/21 order Grafix 01/07/2022 Grafix #4 applied 01/28/2022 Grafix #5 applied Cellular or Tissue Based Product applied to wound bed, secured with steri-strips, cover with Adaptic or Mepitel. (DO NOT REMOVE). Bathing/ Shower/ Hygiene May shower with protection but do not get wound dressing(s) wet. Edema Control - Lymphedema / SCD / Other Elevate legs to the level of the heart or above for 30 minutes daily and/or when sitting, [name] frequency of: Avoid standing for long periods of time. Wound Treatment Wound #5R - Foot Wound Laterality: Right, Lateral Cleanser: Soap and Water 1 x Per Week/7 Days Discharge Instructions: May shower and wash wound with dial antibacterial soap and water prior to dressing change. Peri-Wound Care: Skin Prep 1 x Per Week/7 Days Discharge Instructions: Use skin prep as directed Prim Dressing: Grafix PL Prime 1 x Per Week/7 Days ary Discharge Instructions: provider applies directly to wound bed. leave in place. Prim Dressing: adaptic and steri-strips 1 x Per Week/7 Days ary Discharge Instructions: covered over the Graifx. Leave in place. Secondary Dressing: Optifoam Non-Adhesive Dressing, 4x4 in 1 x Per Week/7 Days Discharge Instructions: Apply over primary dressing as directed. Secondary Dressing: Woven Gauze Sponge, Non-Sterile 4x4 in 1 x Per Week/7 Days Discharge Instructions: Apply over primary dressing as directed. Secured With: 62M Medipore H Soft Cloth Surgical T ape, 4 x 10 (in/yd) 1 x Per Week/7 Days Discharge Instructions: Secure with tape as directed. Electronic Signature(s) Signed: 01/28/2022 11:11:46 AM By: Kalman Shan DO Entered By: Kalman Shan on 01/28/2022  10:42:53 -------------------------------------------------------------------------------- Problem List Details Patient Name: Date of Service: [name], [name] 01/28/2022 9:00 [name] Medical Record Number: [id number] Patient Account Number: [id number] Date of Birth/Sex: Treating RN: [birth year] (66 y.o. [name], [name] Primary Care Provider: [name] Other Clinician: Referring Provider: Treating Provider/Extender: [name] in Treatment: 17 Active Problems ICD-10 Encounter Code Description Active Date MDM Diagnosis L97.512 Non-pressure 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 10/01/2021 No Yes E11.621 Type 2 diabetes mellitus with foot ulcer 10/01/2021 No Yes Arntson, Miata [name] ([id number])  [phone].pdf Page 6 of 11 E11.42 Type 2 diabetes mellitus with diabetic polyneuropathy 10/01/2021 No Yes Inactive Problems Resolved Problems Electronic Signature(s) Signed: 01/28/2022 11:11:46 AM By: Kalman Shan DO Entered By: Kalman Shan on 01/28/2022 10:40:31 -------------------------------------------------------------------------------- Progress Note Details Patient Name: Date of Service: [place], [name] 01/28/2022 9:00 [name] Medical Record Number: [id number] Patient Account Number: [id number] Date of Birth/Sex: Treating RN: 05-Jul-1955 (66 y.o. F) Primary Care Provider: [name] Other Clinician: Referring Provider: Treating Provider/Extender: [name] in Treatment: 17 Subjective Chief Complaint Information obtained from Patient 10/27; Right Plantar foot wound 11/21; 2 small areas of skin breakdown to the right foot following cast placement 8/29; right plantar foot wound History of Present Illness (HPI) Admission 12/17/2020 Ms. Anahla Bevis is [name] 66 year old female with [name] past medical history of insulin-dependent type 2 diabetes,  hypothyroidism and daily1 pack per day cigarette smoker the presents to the clinic for [name] 6-week history of nonhealing wound to the right first MTPJ. She has been following with [name], podiatry for this issue. She has been using silver alginate with dressing changes. She uses [name] postsurgical shoe and offloading pads. She currently denies signs of infection. 10/31; patient presents for follow-up. She tolerated the soft cast fine although she states that she felt her foot rolling to one side. She denies signs of infection. She would like to do the total contact cast today. 12/23/2020 upon evaluation today patient appears to be doing excellent in regard to her wound on the foot and she is in [name] total contact cast. I do think this is appropriate this is the first cast change which we are obliged to do to ensure nothing is rubbing everything appears to be doing quite well and very pleased in that regard. 11/7; patient presents for follow-up. She had no issues with the cast. She denies signs of infection. 11/14; patient presents for 1 week follow-up. She has had no issues with the cast. She denies signs of infection. 11/21; patient presents for 1 week follow-up. She did develop 2 small areas of skin breakdown on either side of the right foot from the cast rubbing. She states she did not feel the wounds developing. She currently denies signs of infection. 11/28; patient presents for 1 week follow-up. She has no issues or complaints today. She denies pain or acute signs of infection. 12/12; patient presents for follow-up. She reports improvement to her right lateral foot wound. She has been using silver alginate to the area. She has no issues or complaints today. 12/19; patient presents for follow-up. She reports that her right lateral foot wound has healed. She has no issues or complaints today. 1/3; patient presents for follow-up. She has no issues or complaints today. She reports no open  wounds. Readmission 10/01/2021 Ms. Zilpha Mcandrew is [name] 66 year old female's with [name] past medical history of insulin-dependent controlled type 2 diabetes complicated by peripheral neuropathy that presents to the clinic for [name] 11-monthhistory of nonhealing ulcer to the right foot. I have seen her before for [name] wound to the same area that was treated and healed. Her wound today however this is much deeper and it has thick yellow drainage. She is not sure exactly how it started. She noticed it 1 day. She has been following with Dr. EAmalia Haileyfor this issue. She had [name] wound culture that showed [name] mix of organisms on 09/01/2021. She was recently started on doxycycline. She is using the dConservation officer, nature She  has been using Iodosorb with dressing changes. She currently denies systemic signs of infection. 8/21; patient presents for follow-up. She states she started and completed levofloxacin. She is still taking doxycycline. She reports improvement in drainage and odor. She has been doing Dakin's wet-to-dry dressings as well. She is using her front offloading shoe. She denies signs of infection. 8/29; patient presents for follow-up. She is still taking doxycycline. She continues to report improvement in drainage and reports no odor. She denies any purulent drainage. She has been doing silver alginate to the wound bed and using her front offloading shoe. 9/5; patient presents for follow-up. She completed the course of levofloxacin. She has someone that is able to drive her today so we can place the total contact cast. She reports no signs of infection. We have been doing silver alginate to the wound bed. [name], [name] ([id number]) [id number] Page 7 of 11 9/7; patient presents for follow-up. She has tolerated the total contact cast well. She presents for her obligatory cast change. She has no issues or complaints today. 9/11; patient presents for follow-up. She tolerated the total contact  cast well. She has no issues or complaints today. We discussed potentially doing [name] skin substitute and patient would like to see if her insurance will cover this. 9/18; patient presents for follow-up. She had no issues with the total contact cast. She been approved for Grafix and patient would like to have this placed today. 9/25; patient presents for follow-up. Grafix #1 was placed in standard fashion at last clinic visit. She has no issues or complaints today. She tolerated the cast well. 10/2; patient presents for follow-up. Grafix #2 was placed in standard fashion at last clinic visit. Unfortunately she developed [name] wound to the lateral aspect of the foot over the past week. She states she has been on her feet [name] lot more and walking more. She denies systemic signs of infection. 10/9; patient presents for follow-up. Grafix #3 was placed in standard at last clinic visit. The plantar foot wound has healed with this. She developed [name] new wound to the lateral aspect last week and this has gotten larger. She reports soreness to this area. She denies systemic signs of infection. 10/16; patient presents for follow-up. Her plantar wound continues to remain closed. Her lateral wound has improved with the use of Hydrofera Blue. She has no issues or complaints today. She denies signs of infection. She has almost completed her course of antibiotics prescribed at last clinic visit. 10/23; patient presents for follow-up. She has been using Hydrofera Blue and Medihoney to the wound bed. She has no issues or complaints today. She has been using her surgical shoe. 10/30; patient presents for follow-up. We have been using Hydrofera Blue and Medihoney to the wound bed. Patient has no issues or complaints today. 11/10; patient presents for follow-up. She has been using Hydrofera Blue and Medihoney without issues. She has been wearing her Orthotics. It is unclear if she is offloading this area or not. 11/17; patient  presents for follow-up. She has been using Hydrofera Blue and Medihoney to the foot wound without issues. Grafix was available today for placement and patient would like to proceed with this. 12/4; patient presents for follow-up. Grafix was placed at last clinic visit. Her wound is healed. She has no issues or complaints today. She is reported no drainage from the previous wound site. 12/8; patient states that her podiatrist removed [name] callus on the previous wound site to the lateral right  foot and there is [name] wound present. She currently denies signs of infection. Patient History Information obtained from Patient, Chart. Family History Unknown History. Social History Current every day smoker - 1 pack/[name]/day, Alcohol Use - Never, Drug Use - No History, Caffeine Use - Moderate. Medical History Eyes Denies history of Cataracts, Glaucoma, Optic Neuritis Ear/Nose/Mouth/Throat Denies history of Chronic sinus problems/congestion, Middle ear problems Hematologic/Lymphatic Denies history of Anemia, Hemophilia, Human Immunodeficiency Virus, Lymphedema, Sickle Cell Disease Respiratory Patient has history of Sleep Apnea Denies history of Aspiration, Asthma, Chronic Obstructive Pulmonary Disease (COPD), Pneumothorax, Tuberculosis Cardiovascular Patient has history of Coronary Artery Disease, Hypertension Denies history of Angina, Arrhythmia, Congestive Heart Failure, Deep Vein Thrombosis, Hypotension, Myocardial Infarction, Peripheral Arterial Disease, Peripheral Venous Disease, Phlebitis, Vasculitis Gastrointestinal Denies history of Cirrhosis , Colitis, Crohnoos, Hepatitis [name], Hepatitis B, Hepatitis C Endocrine Patient has history of Type II Diabetes Denies history of Type I Diabetes Genitourinary Denies history of End Stage Renal Disease Immunological Denies history of Lupus Erythematosus, Raynaudoos, Scleroderma Integumentary (Skin) Denies history of History of Burn Musculoskeletal Denies  history of Gout, Rheumatoid Arthritis, Osteoarthritis, Osteomyelitis Neurologic Patient has history of Neuropathy Denies history of Dementia, Quadriplegia, Paraplegia, Seizure Disorder Hospitalization/Surgery History - 2nd and 3rd right toe amps. - total hysterectomy. - cholecystectomy. - 2 knee surgery's on left and 1 on right. Medical [name] Surgical History Notes nd Cardiovascular hypercholesterolemia [name], [name] ([id number]) [id number] Page 8 of 11 Objective Constitutional respirations regular, non-labored and within target range for patient.. Vitals Time Taken: 9:03 AM, Temperature: 97.7 F, Pulse: 75 bpm, Respiratory Rate: 17 breaths/min, Blood Pressure: 140/79 mmHg, Capillary Blood Glucose: 97.7 mg/dl. Cardiovascular 2+ dorsalis pedis/posterior tibialis pulses. Psychiatric pleasant and cooperative. General Notes: T the lateral aspect of the right foot there is [name] small open wound with granulation tissue and nonviable tissue. No surrounding signs of o infection including increased warmth, erythema or purulent drainage. Integumentary (Hair, Skin) Wound #5R status is Open. Original cause of wound was Blister. The date acquired was: 11/22/2021. The wound has been in treatment 9 weeks. The wound is located on the Right,Lateral Foot. The wound measures 0.3cm length x 0.1cm width x 0.3cm depth; 0.024cm^2 area and 0.007cm^3 volume. There is no tunneling noted, however, there is undermining starting at 12:00 and ending at 12:00 with [name] maximum distance of 0.5cm. There is [name] medium amount of serosanguineous drainage noted. The wound margin is distinct with the outline attached to the wound base. There is large (67-100%) red, pink granulation within the wound bed. There is no necrotic tissue within the wound bed. The periwound skin appearance had no abnormalities noted for color. The periwound skin appearance exhibited: Callus, Dry/Scaly. The periwound skin  appearance did not exhibit: Crepitus, Excoriation, Induration, Rash, Scarring, Maceration. Periwound temperature was noted as No Abnormality. The periwound has tenderness on palpation. Assessment Active Problems ICD-10 Non-pressure 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 Type 2 diabetes mellitus with foot ulcer Type 2 diabetes mellitus with diabetic polyneuropathy Patient presents back with [name] wound to the right lateral foot. I debrided nonviable tissue. She was previously approved for Grafix and this was available today for application. This was placed in standard fashion. I recommended continuing to offload the area with her surgical shoe. Follow-up in 1 week. Procedures Wound #5R Pre-procedure diagnosis of Wound #5R is [name] Diabetic Wound/Ulcer of the Lower Extremity located on the Right,Lateral Foot .Severity of Tissue Pre Debridement is: Fat layer exposed. There was [name] Excisional Skin/Subcutaneous  Tissue Debridement with [name] total area of 0.03 sq cm performed by Kalman Shan, DO. With the following instrument(s): Curette to remove Viable and Non-Viable tissue/material. Material removed includes Subcutaneous Tissue and Slough and after achieving pain control using Lidocaine 4% T opical Solution. [name] time out was conducted at 09:35, prior to the start of the procedure. [name] Minimum amount of bleeding was controlled with N/[name]. The procedure was tolerated well with [name] pain level of 0 throughout and [name] pain level of 0 following the procedure. Post Debridement Measurements: 0.3cm length x 0.1cm width x 0.3cm depth; 0.007cm^3 volume. Character of Wound/Ulcer Post Debridement is improved. Severity of Tissue Post Debridement is: Fat layer exposed. Post procedure Diagnosis Wound #5R: Same as Pre-Procedure Pre-procedure diagnosis of Wound #5R is [name] Diabetic Wound/Ulcer of the Lower Extremity located on the Right,Lateral Foot. [name] skin graft procedure using [name] bioengineered skin  substitute/cellular or tissue based product was performed by Kalman Shan, DO with the following instrument(s): Forceps and Scissors. Grafix prime was applied and secured with Steri-Strips and adaptic. 2 sq cm of product was utilized and 4 sq cm was wasted due to wound size. Post Application, no dressing was applied. [name] Time Out was conducted at 09:40, prior to the start of the procedure. The procedure was tolerated well with [name] pain level of 0 throughout and [name] pain level of 0 following the procedure. Post procedure Diagnosis Wound #5R: Same as Pre-Procedure . Plan Follow-up Appointments: Return Appointment in 1 week. - Thursday 02/03/2022 230pm with Dr. Heber [place] Anesthetic: (In clinic) Topical Lidocaine 4% applied to wound bed Cellular or Tissue Based Products: Cellular or Tissue Based Product Type: - Run IVR for grafix 12/30/21 order Grafix 01/07/2022 Grafix #4 applied 01/28/2022 Grafix #5 applied Cellular or Tissue Based Product applied to wound bed, secured with steri-strips, cover with Adaptic or Mepitel. (DO NOT REMOVE). Bathing/ Shower/ Hygiene: [name], [name] ([id number]) [id number] Page 9 of 11 May shower with protection but do not get wound dressing(s) wet. Edema Control - Lymphedema / SCD / Other: Elevate legs to the level of the heart or above for 30 minutes daily and/or when sitting, [name] frequency of: Avoid standing for long periods of time. WOUND #5R: - Foot Wound Laterality: Right, Lateral Cleanser: Soap and Water 1 x Per Week/7 Days Discharge Instructions: May shower and wash wound with dial antibacterial soap and water prior to dressing change. Peri-Wound Care: Skin Prep 1 x Per Week/7 Days Discharge Instructions: Use skin prep as directed Prim Dressing: Grafix PL Prime 1 x Per Week/7 Days ary Discharge Instructions: provider applies directly to wound bed. leave in place. Prim Dressing: adaptic and steri-strips 1 x Per Week/7  Days ary Discharge Instructions: covered over the Graifx. Leave in place. Secondary Dressing: Optifoam Non-Adhesive Dressing, 4x4 in 1 x Per Week/7 Days Discharge Instructions: Apply over primary dressing as directed. Secondary Dressing: Woven Gauze Sponge, Non-Sterile 4x4 in 1 x Per Week/7 Days Discharge Instructions: Apply over primary dressing as directed. Secured With: 1M Medipore H Soft Cloth Surgical T ape, 4 x 10 (in/yd) 1 x Per Week/7 Days Discharge Instructions: Secure with tape as directed. 1. In office sharp debridement 2. Grafix placed in standard fashion 3. Follow-up in 1 week 4. Aggressive offloadingoosurgical shoe Electronic Signature(s) Signed: 01/28/2022 11:11:46 AM By: Kalman Shan DO Entered By: Kalman Shan on 01/28/2022 10:43:50 -------------------------------------------------------------------------------- HxROS Details Patient Name: Date of Service: [name], [name] 01/28/2022 9:00 [name] Medical Record Number: [id number] Patient Account Number: [id number]  Date of Birth/Sex: Treating RN: 1956/01/11 (67 y.o. F) Primary Care Provider: [name] Other Clinician: Referring Provider: Treating Provider/Extender: [name] in Treatment: 17 Information Obtained From Patient Chart Eyes Medical History: Negative for: Cataracts; Glaucoma; Optic Neuritis Ear/Nose/Mouth/Throat Medical History: Negative for: Chronic sinus problems/congestion; Middle ear problems Hematologic/Lymphatic Medical History: Negative for: Anemia; Hemophilia; Human Immunodeficiency Virus; Lymphedema; Sickle Cell Disease Respiratory Medical History: Positive for: Sleep Apnea Negative for: Aspiration; Asthma; Chronic Obstructive Pulmonary Disease (COPD); Pneumothorax; Tuberculosis Cardiovascular Medical History: Positive for: Coronary Artery Disease; Hypertension Negative for: Angina; Arrhythmia; Congestive Heart Failure; Deep Vein  Thrombosis; Hypotension; Myocardial Infarction; Peripheral Arterial Disease; Peripheral Venous Disease; Phlebitis; Vasculitis Past Medical History Notes: hypercholesterolemia [name], [name] ([id number]) [id number] Page 10 of 11 Gastrointestinal Medical History: Negative for: Cirrhosis ; Colitis; Crohns; Hepatitis [name]; Hepatitis B; Hepatitis C Endocrine Medical History: Positive for: Type II Diabetes Negative for: Type I Diabetes Genitourinary Medical History: Negative for: End Stage Renal Disease Immunological Medical History: Negative for: Lupus Erythematosus; Raynauds; Scleroderma Integumentary (Skin) Medical History: Negative for: History of Burn Musculoskeletal Medical History: Negative for: Gout; Rheumatoid Arthritis; Osteoarthritis; Osteomyelitis Neurologic Medical History: Positive for: Neuropathy Negative for: Dementia; Quadriplegia; Paraplegia; Seizure Disorder Immunizations Pneumococcal Vaccine: Received Pneumococcal Vaccination: Yes Received Pneumococcal Vaccination On or After 60th Birthday: Yes Tetanus Vaccine: Last tetanus shot: 12/17/2020 Implantable Devices No devices added Hospitalization / Surgery History Type of Hospitalization/Surgery 2nd and 3rd right toe amps total hysterectomy cholecystectomy 2 knee surgery's on left and 1 on right Family and Social History Unknown History: Yes; Current every day smoker - 1 pack/[name]/day; Alcohol Use: Never; Drug Use: No History; Caffeine Use: Moderate; Financial Concerns: No; Food, Clothing or Shelter Needs: No; Support System Lacking: No; Transportation Concerns: No Electronic Signature(s) Signed: 01/28/2022 11:11:46 AM By: Kalman Shan DO Entered By: Kalman Shan on 01/28/2022 10:42:00 -------------------------------------------------------------------------------- SuperBill Details Patient Name: Date of Service: [name], [name] 01/28/2022 [name], [name]  ([id number]) [id number] Page 11 of 11 Medical Record Number: [id number] Patient Account Number: [id number] Date of Birth/Sex: Treating RN: 07/10/1955 (66 y.o. [name], [name] Primary Care Provider: [name] Other Clinician: Referring Provider: Treating Provider/Extender: [name] in Treatment: 17 Diagnosis Coding ICD-10 Codes Code Description [phone] Non-pressure 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 E11.621 Type 2 diabetes mellitus with foot ulcer E11.42 Type 2 diabetes mellitus with diabetic polyneuropathy Facility Procedures : CPT4 Code: [id number] Description: Z3007- Grafix PL 2X3 sq cm Modifier: Quantity: 6 : CPT4 Code: [id number] Description: 45625 - SKIN SUB GRAFT FACE/NK/HF/G ICD-10 Diagnosis Description L97.512 Non-pressure 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 Modifier: Quantity: 1 Physician Procedures : CPT4 Code Description Modifier [id number] - WC PHYS SKIN SUB GRAFT FACE/NK/HF/G ICD-10 Diagnosis Description L97.512 Non-pressure 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 Quantity: 1 Electronic Signature(s) Signed: 01/28/2022 11:11:46 AM By: Kalman Shan DO Entered By: Kalman Shan on 01/28/2022 10:44:10

## 2022-02-03 ENCOUNTER — Encounter (HOSPITAL_BASED_OUTPATIENT_CLINIC_OR_DEPARTMENT_OTHER): Payer: Medicare Other | Admitting: Internal Medicine

## 2022-02-03 ENCOUNTER — Ambulatory Visit (INDEPENDENT_AMBULATORY_CARE_PROVIDER_SITE_OTHER): Payer: Medicare Other | Admitting: Licensed Clinical Social Worker

## 2022-02-03 DIAGNOSIS — L97512 Non-pressure 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: Secondary | ICD-10-CM

## 2022-02-03 DIAGNOSIS — F17210 Nicotine dependence, cigarettes, uncomplicated: Secondary | ICD-10-CM | POA: Diagnosis not present

## 2022-02-03 DIAGNOSIS — E1142 Type 2 diabetes mellitus with diabetic polyneuropathy: Secondary | ICD-10-CM | POA: Diagnosis not present

## 2022-02-03 DIAGNOSIS — F331 Major depressive disorder, recurrent, moderate: Secondary | ICD-10-CM | POA: Diagnosis not present

## 2022-02-03 DIAGNOSIS — Z794 Long term (current) use of insulin: Secondary | ICD-10-CM | POA: Diagnosis not present

## 2022-02-03 DIAGNOSIS — E11621 Type 2 diabetes mellitus with foot ulcer: Secondary | ICD-10-CM | POA: Diagnosis not present

## 2022-02-03 NOTE — Progress Notes (Addendum)
[name] ([id number]) [id number] Page 1 of 8 Visit Report for 02/03/2022 Arrival Information Details Patient Name: Date of Service: [name], [name] 02/03/2022 2:30 PM Medical Record Number: [id number] Patient Account Number: [id number] Date of Birth/Sex: Treating [name]: Jun 02, 1955 (66 y.o. [name], [name] Primary Care [name]: [name] Other Clinician: Referring [name]: Treating [name]/Extender: [name] in Treatment: 32 Visit Information History Since Last Visit Added or deleted any medications: No Patient Arrived: Ambulatory Any new allergies or adverse reactions: No Arrival Time: 14:44 Had [name] fall or experienced change in No Accompanied By: self activities of daily living that may affect Transfer Assistance: None risk of falls: Patient Identification Verified: Yes Signs or symptoms of abuse/neglect since last visito No Secondary Verification Process Completed: Yes Hospitalized since last visit: No Patient Requires Transmission-Based Precautions: No Implantable device outside of the clinic excluding No Patient Has Alerts: No cellular tissue based products placed in the center since last visit: Has Dressing in Place as Prescribed: Yes Pain Present Now: No Electronic Signature(s) Signed: 02/03/2022 4:55:27 PM By: [name] Entered By: [name] Pilling on 02/03/2022 14:44:57 -------------------------------------------------------------------------------- Encounter Discharge Information Details Patient Name: Date of Service: [name], [name] 02/03/2022 2:30 PM Medical Record Number: [id number] Patient Account Number: [id number] Date of Birth/Sex: Treating [name]: 31-Oct-1955 (66 y.o. [name], [name] Primary Care [name]: [name] Other Clinician: Referring Harleen Fineberg: Treating [name]/Extender: [name] in Treatment:  17 Encounter Discharge Information Items Post Procedure Vitals Discharge Condition: Stable Temperature (F): 98.7 Ambulatory Status: Ambulatory Pulse (bpm): 74 Discharge Destination: Home Respiratory Rate (breaths/min): 17 Transportation: Private Auto Blood Pressure (mmHg): 134/74 Accompanied By: self Schedule Follow-up Appointment: Yes Clinical Summary of Care: Patient Declined Electronic Signature(s) Signed: 02/03/2022 5:16:09 PM By: [name] Entered By: [name] Hammock on 02/03/2022 15:32:42 [name], [name] ([id number]) [id number] Page 2 of 8 -------------------------------------------------------------------------------- Lower Extremity Assessment Details Patient Name: Date of Service: [name] 02/03/2022 2:30 PM Medical Record Number: [id number] Patient Account Number: [id number] Date of Birth/Sex: Treating [name]: [birth year] (66 y.o. [name], [name] Primary Care [name]: [name] Other Clinician: Referring [name]: Treating [name]/Extender: [name] in Treatment: 17 Edema Assessment Assessed: [name]: No] Patrice Paradise: Yes] Edema: [Left: Ye] [Right: s] Calf Left: Right: Point of Measurement: From Medial Instep 42 cm Ankle Left: Right: Point of Measurement: From Medial Instep 21 cm Vascular Assessment Pulses: Dorsalis Pedis Palpable: [Right:Yes] Electronic Signature(s) Signed: 02/03/2022 4:55:27 PM By: [name] Entered By: [name] Pilling on 02/03/2022 14:46:03 -------------------------------------------------------------------------------- Multi Wound Chart Details Patient Name: Date of Service: [name], [name] 02/03/2022 2:30 PM Medical Record Number: [id number] Patient Account Number: [id number] Date of Birth/Sex: Treating [name]: 31-Jul-1955 (66 y.o. F) Primary Care [name]: [name] Other Clinician: Referring [name]: Treating  Rhone [name]/Extender: [name] in Treatment: 17 Vital Signs Height(in): Capillary Blood Glucose(mg/dl): 124 Weight(lbs): Pulse(bpm): 74 Body Mass Index(BMI): Blood Pressure(mmHg): 143/76 Temperature(F): 97.8 Respiratory Rate(breaths/min): 20 [5R:Photos:] [N/[name]:N/[name]] Right, Lateral Foot N/[name] N/[name] Wound Location: Blister N/[name] N/[name] Wounding Event: Diabetic Wound/Ulcer of the Lower N/[name] N/[name] Primary Etiology: Extremity Sleep Apnea, Coronary Artery N/[name] N/[name] Comorbid History: Disease, Hypertension, Type II Diabetes, Neuropathy 11/22/2021 N/[name] N/[name] Date Acquired: 10 N/[name] N/[name] Weeks of Treatment: Open N/[name] N/[name] Wound Status: Yes N/[name] N/[name] Wound Recurrence: 0.3x0.3x0.2 N/[name] N/[name] Measurements L x W x D (cm) 0.071 N/[name] N/[name]  [name] (cm) : rea 0.014 N/[name] N/[name] Volume (cm) : 96.00% N/[name] N/[name] % Reduction in [name] rea: 97.40% N/[name] N/[name] % Reduction in Volume: 12 Starting Position 1 (o'clock): 12 Ending Position 1 (o'clock): 0.3 Maximum Distance 1 (cm): Yes N/[name] N/[name] Undermining: Grade 2 N/[name] N/[name] Classification: None Present N/[name] N/[name] Exudate [name] mount: Distinct, outline attached N/[name] N/[name] Wound Margin: Large (67-100%) N/[name] N/[name] Granulation [name] mount: Red, Pink N/[name] N/[name] Granulation Quality: Small (1-33%) N/[name] N/[name] Necrotic [name] mount: Fascia: No N/[name] N/[name] Exposed Structures: Fat Layer (Subcutaneous Tissue): No Tendon: No Muscle: No Joint: No Bone: No Large (67-100%) N/[name] N/[name] Epithelialization: Debridement - Excisional N/[name] N/[name] Debridement: Pre-procedure Verification/Time Out 15:27 N/[name] N/[name] Taken: Lidocaine N/[name] N/[name] Pain Control: Callus, Subcutaneous, Slough N/[name] N/[name] Tissue Debrided: Skin/Subcutaneous Tissue N/[name] N/[name] Level: 0.09 N/[name] N/[name] Debridement [name] (sq cm): rea Curette N/[name] N/[name] Instrument: Minimum N/[name] N/[name] Bleeding: Pressure N/[name] N/[name] Hemostasis [name] chieved: 0 N/[name] N/[name] Procedural Pain: 0 N/[name] N/[name] Post Procedural Pain: Procedure was tolerated well N/[name] N/[name] Debridement Treatment  Response: 0.3x0.3x0.2 N/[name] N/[name] Post Debridement Measurements L x W x D (cm) 0.014 N/[name] N/[name] Post Debridement Volume: (cm) Callus: Yes N/[name] N/[name] Periwound Skin Texture: Excoriation: No Induration: No Crepitus: No Rash: No Scarring: No Dry/Scaly: Yes N/[name] N/[name] Periwound Skin Moisture: Maceration: No Atrophie Blanche: No N/[name] N/[name] Periwound Skin Color: Cyanosis: No Ecchymosis: No Erythema: No Hemosiderin Staining: No Mottled: No Pallor: No Rubor: No No Abnormality N/[name] N/[name] Temperature: Yes N/[name] N/[name] Tenderness on Palpation: Cellular or Tissue Based Product N/[name] N/[name] Procedures Performed: Debridement Treatment Notes Wound #5R (Foot) Wound Laterality: Right, Lateral Cleanser Soap and Water Discharge Instruction: May shower and wash wound with dial antibacterial soap and water prior to dressing change. Peri-Wound Care Skin Prep Discharge Instruction: Use skin prep as directed Topical Zhana, Jeangilles Verle [name] ([id number]) [phone].pdf Page 4 of 8 Primary Dressing Grafix PL Prime Discharge Instruction: Shahzad Thomann applies directly to wound bed. leave in place. adaptic and steri-strips Discharge Instruction: covered over the Graifx. Leave in place. Secondary Dressing Optifoam Non-Adhesive Dressing, 4x4 in Discharge Instruction: Apply over primary dressing as directed. Woven Gauze Sponge, Non-Sterile 4x4 in Discharge Instruction: Apply over primary dressing as directed. Secured With 51M Medipore H Soft Cloth Surgical T ape, 4 x 10 (in/yd) Discharge Instruction: Secure with tape as directed. Compression Wrap Compression Stockings Add-Ons Electronic Signature(s) Signed: 02/04/2022 1:27:05 PM By: Kalman Shan DO Entered By: Kalman Shan on 02/04/2022 09:52:31 -------------------------------------------------------------------------------- Multi-Disciplinary Care Plan Details Patient Name: Date of Service: [name], [name] 02/03/2022 2:30 PM Medical  Record Number: [id number] Patient Account Number: [id number] Date of Birth/Sex: Treating [name]: 03/11/1955 (66 y.o. [name], [name] Primary Care Bernardette Waldron: [name] Other Clinician: Referring Orlen Leedy: Treating Braylyn Kalter/Extender: [name] in Treatment: 17 Active Inactive Orientation to the Wound Care Program Nursing Diagnoses: Knowledge deficit related to the wound healing center program Goals: Patient/caregiver will verbalize understanding of the [place] [facility] Date Initiated: 10/01/2021 Target Resolution Date: 03/24/2022 Goal Status: Active Interventions: Provide education on orientation to the wound center Notes: Electronic Signature(s) Signed: 02/03/2022 5:16:09 PM By: [name] Entered By: [name] Hammock on 02/03/2022 15:30:25 [name], [name] ([id number]) [id number] Page 5 of 8 -------------------------------------------------------------------------------- Pain Assessment Details Patient Name: Date of Service: [name], [name] 02/03/2022 2:30 PM Medical Record Number: [id number] Patient Account Number: [id number] Date of Birth/Sex: Treating [name]: [birth year] (66 y.o. [name] Primary Care Hildegard Hlavac: [name] Other  Clinician: Referring Timiko Offutt: Treating Austen Wygant/Extender: [name] in Treatment: 17 Active Problems Location of Pain Severity and Description of Pain Patient Has Paino No Site Locations Rate the pain. Current Pain Level: 0 Pain Management and Medication Current Pain Management: Medication: No Cold Application: No Rest: No Massage: No Activity: No T.E.N.S.: No Heat Application: No Leg drop or elevation: No Is the Current Pain Management Adequate: Adequate How does your wound impact your activities of daily livingo Sleep: No Bathing: No Appetite: No Relationship With Others: No Bladder  Continence: No Emotions: No Bowel Continence: No Work: No Toileting: No Drive: No Dressing: No Hobbies: No Engineer, maintenance) Signed: 02/03/2022 4:55:27 PM By: [name] Entered By: [name] Pilling on 02/03/2022 14:45:26 -------------------------------------------------------------------------------- Patient/Caregiver Education Details Patient Name: Date of Service: [name], [name] 12/14/2023andnbsp2:30 PM Medical Record Number: [id number] Patient Account Number: [id number] Date of Birth/Gender: Treating [name]: 04-Dec-1955 (66 y.o. [name], [name] Primary Care Physician: [name] Other Clinician: Referring Physician: Treating Physician/Extender: [name] in Treatment: [address] [place] ([id number]) [id number] Page 6 of 8 Education Assessment Education Provided To: Patient Education Topics Provided Safety: Methods: Explain/Verbal Responses: Reinforcements needed, State content correctly Electronic Signature(s) Signed: 02/03/2022 5:16:09 PM By: [name] Entered By: [name] Hammock on 02/03/2022 15:22:42 -------------------------------------------------------------------------------- Wound Assessment Details Patient Name: Date of Service: [name], [name] 02/03/2022 2:30 PM Medical Record Number: [id number] Patient Account Number: [id number] Date of Birth/Sex: Treating [name]: January 08, 1956 (66 y.o. [name], [name] Primary Care Chevon Fomby: [name] Other Clinician: Referring Guiselle Mian: Treating Jazariah Teall/Extender: [name] in Treatment: 17 Wound Status Wound Number: 5R Primary Diabetic Wound/Ulcer of the Lower Extremity Etiology: Wound Location: Right, Lateral Foot Wound Open Wounding Event: Blister Status: Date Acquired: 11/22/2021 Comorbid Sleep Apnea, Coronary Artery Disease, Hypertension, Type II Weeks Of  Treatment: 10 History: Diabetes, Neuropathy Clustered Wound: No Photos Wound Measurements Length: (cm) 0.3 Width: (cm) 0.3 Depth: (cm) 0.2 Area: (cm) 0.071 Volume: (cm) 0.014 % Reduction in Area: 96% % Reduction in Volume: 97.4% Epithelialization: Large (67-100%) Tunneling: No Undermining: Yes Starting Position (o'clock): 12 Ending Position (o'clock): 12 Maximum Distance: (cm) 0.3 Wound Description Classification: Grade 2 Wound Margin: Distinct, outline attached Exudate Amount: None Present Foul Odor After Cleansing: No Slough/Fibrino No Wound Bed Elvena, [name] ([id number]) [id number] Page 7 of 8 Granulation Amount: Large (67-100%) Exposed Structure Granulation Quality: Red, Pink Fascia Exposed: No Necrotic Amount: Small (1-33%) Fat Layer (Subcutaneous Tissue) Exposed: No Necrotic Quality: Adherent Slough Tendon Exposed: No Muscle Exposed: No Joint Exposed: No Bone Exposed: No Periwound Skin Texture Texture Color No Abnormalities Noted: No No Abnormalities Noted: Yes Callus: Yes Temperature / Pain Crepitus: No Temperature: No Abnormality Excoriation: No Tenderness on Palpation: Yes Induration: No Rash: No Scarring: No Moisture No Abnormalities Noted: No Dry / Scaly: Yes Maceration: No Treatment Notes Wound #5R (Foot) Wound Laterality: Right, Lateral Cleanser Soap and Water Discharge Instruction: May shower and wash wound with dial antibacterial soap and water prior to dressing change. Peri-Wound Care Skin Prep Discharge Instruction: Use skin prep as directed Topical Primary Dressing Grafix PL Prime Discharge Instruction: Trinity Hyland applies directly to wound bed. leave in place. adaptic and steri-strips Discharge Instruction: covered over the Graifx. Leave in place. Secondary Dressing Optifoam Non-Adhesive Dressing, 4x4 in Discharge Instruction: Apply over primary dressing as directed. Woven Gauze Sponge, Non-Sterile  4x4 in Discharge Instruction: Apply over primary dressing as directed. Secured  With 23M Medipore H Soft Cloth Surgical T ape, 4 x 10 (in/yd) Discharge Instruction: Secure with tape as directed. Compression Wrap Compression Stockings Add-Ons Electronic Signature(s) Signed: 02/03/2022 5:16:09 PM By: [name] Entered By: [name] Hammock on 02/03/2022 15:27:16 -------------------------------------------------------------------------------- Vitals Details Patient Name: Date of Service: [name], [name] 02/03/2022 2:30 PM Medical Record Number: [id number] Patient Account Number: [id number] Date of Birth/Sex: Treating [name]: 1955-10-21 (66 y.o. [address], [place], Tamora [name] ([id number]) [phone].pdf Page 8 of 8 Primary Care Lewis Keats: [name] Other Clinician: Referring Natalio Salois: Treating [name]/Extender: [name] in Treatment: 17 Vital Signs Time Taken: 14:45 Temperature (F): 97.8 Pulse (bpm): 83 Respiratory Rate (breaths/min): 20 Blood Pressure (mmHg): 143/76 Capillary Blood Glucose (mg/dl): 124 Reference Range: 80 - 120 mg / dl Electronic Signature(s) Signed: 02/03/2022 4:55:27 PM By: [name] Entered By: [name] Pilling on 02/03/2022 14:45:18

## 2022-02-03 NOTE — Progress Notes (Signed)
  THERAPIST PROGRESS NOTE  Session Time: 9-10a  [place] in office visit for patient and LCSW clinician  Participation Level: Active  Behavioral Response: NAAlertDepressed  Type of Therapy: Individual Therapy  Treatment Goals addressed:   ProgressTowards Goals: Progressing  Interventions: CBT, Supportive, and Reframing  Summary: [name] is a 66 y.o. female who presents with improving symptoms associated with depression diagnosis.   Allowed pt to explore and express thoughts and feelings associated with recent life situations and external stressors. [name] reports that she is continuing to manage her anxiety and depression symptoms. Patient reports that she's compliant with her medication and is also very happy about recent weight loss period patient reports that due to the weight loss, she is managing her blood sugar better. The patient does report concerns about a wound on her foot that recently has opened back up again after being cleared from the wound center.  Patient reports that she is spending quality time with family members, but is missing her niece, [name] Patient is trying to understand [name]'s mindset, and why she has made the decision to "ghost" herself from the rest of the family. Patient reports that she enjoys cooking, baking, and spending time with her dogs.  Patient reports that she has been spending a lot of time with an elderly friend, [name] patient reports that when she spends time taking care of others, it makes her feel good about herself. Encouraged patient to continue being active, being social, and focusing on her self-care period patient reflects understanding.   Continued recommendations are as follows: self care behaviors, positive social engagements, focusing on overall work/home/life balance, and focusing on positive physical and emotional wellness.   Suicidal/Homicidal: No  Therapist Response: Pt is  continuing to apply interventions learned in session into daily life situations. Pt is currently on track to meet goals utilizing interventions mentioned above. Personal growth and progress noted. Treatment to continue as indicated.   Plan: Return again in 4 weeks.  Diagnosis:  Encounter Diagnosis  Name Primary?   MDD (major depressive disorder), recurrent episode, moderate ([place]) Yes   Collaboration of Care: Other pt to continue follow ups with PCP Dr. [facility]  Patient/Guardian was advised Release of Information must be obtained prior to any record release in order to collaborate their care with an outside provider. Patient/Guardian was advised if they have not already done so to contact the registration department to sign all necessary forms in order for [place] to release information regarding their care.   Consent: Patient/Guardian gives verbal consent for treatment and assignment of benefits for services provided during this visit. Patient/Guardian expressed understanding and agreed to proceed.   [place], LCSW 02/03/2022

## 2022-02-03 NOTE — Progress Notes (Signed)
VINEY, ACOCELLA ([id number]) [id number] Page 1 of 7 Visit Report for 01/28/2022 Arrival Information Details Patient Name: Date of Service: [name], [name] 01/28/2022 9:00 [place] Record Number: [id number] Patient Account Number: [id number] Date of Birth/Sex: Treating RN: 11-14-1955 (66 y.o. [name], [name] Primary Care Emmanuel Gruenhagen: [name] Other Clinician: Referring Destanie Tibbetts: Treating Loranda Mastel/Extender: [name] in Treatment: 70 Visit Information History Since Last Visit Added or deleted any medications: No Patient Arrived: Ambulatory Any new allergies or adverse reactions: No Arrival Time: 09:03 Had a fall or experienced change in No Accompanied By: self activities of daily living that may affect Transfer Assistance: None risk of falls: Patient Identification Verified: Yes Signs or symptoms of abuse/neglect since last visito No Secondary Verification Process Completed: Yes Hospitalized since last visit: No Patient Requires Transmission-Based Precautions: No Implantable device outside of the clinic excluding No Patient Has Alerts: No cellular tissue based products placed in the center since last visit: Has Dressing in Place as Prescribed: Yes Pain Present Now: No Electronic Signature(s) Signed: 02/03/2022 5:16:09 PM By: Rhae Hammock RN Entered By: Rhae Hammock on 01/28/2022 09:03:41 -------------------------------------------------------------------------------- Encounter Discharge Information Details Patient Name: Date of Service: [name], [name] 01/28/2022 9:00 A M Medical Record Number: [id number] Patient Account Number: [id number] Date of Birth/Sex: Treating RN: 07-09-1955 (66 y.o. [name], [name] Primary Care Dillon Livermore: [name] Other Clinician: Referring Nachelle Negrette: Treating Kamaal Cast/Extender: [name] in Treatment:  17 Encounter Discharge Information Items Post Procedure Vitals Discharge Condition: Stable Temperature (F): 97.7 Ambulatory Status: Ambulatory Pulse (bpm): 75 Discharge Destination: Home Respiratory Rate (breaths/min): 20 Transportation: Private Auto Blood Pressure (mmHg): 140/79 Accompanied By: self Schedule Follow-up Appointment: Yes Clinical Summary of Care: Electronic Signature(s) Signed: 01/28/2022 3:28:23 PM By: Deon Pilling RN, BSN Entered By: Deon Pilling on 01/28/2022 11:32:47 Harmening, Janal K ([id number]) [id number] Page 2 of 7 -------------------------------------------------------------------------------- Lower Extremity Assessment Details Patient Name: Date of Service: [name], [name] 01/28/2022 9:00 A M Medical Record Number: [id number] Patient Account Number: [id number] Date of Birth/Sex: Treating RN: March 21, 1955 (66 y.o. [name], [name] Primary Care Nimsi Males: [name] Other Clinician: Referring Maheen Cwikla: Treating Vicent Febles/Extender: [name] in Treatment: 17 Edema Assessment Assessed: [name]: No] Patrice Paradise: Yes] Edema: [Left: Ye] [Right: s] Calf Left: Right: Point of Measurement: From Medial Instep 42 cm Ankle Left: Right: Point of Measurement: From Medial Instep 21 cm Electronic Signature(s) Signed: 02/03/2022 5:16:09 PM By: Rhae Hammock RN Entered By: Rhae Hammock on 01/28/2022 09:05:10 -------------------------------------------------------------------------------- Multi Wound Chart Details Patient Name: Date of Service: [name], [name] 01/28/2022 9:00 A M Medical Record Number: [id number] Patient Account Number: [id number] Date of Birth/Sex: Treating RN: 1955/02/24 (66 y.o. F) Primary Care Shatia Sindoni: [name] Other Clinician: Referring Thi Sisemore: Treating Bentleigh Waren/Extender: [name] in Treatment: 17 Vital  Signs Height(in): Capillary Blood Glucose(mg/dl): 97.7 Weight(lbs): Pulse(bpm): 75 Body Mass Index(BMI): Blood Pressure(mmHg): 140/79 Temperature(F): 97.7 Respiratory Rate(breaths/min): 17 [5R:Photos:] [N/A:N/A] Right, Lateral Foot N/A N/A Wound Location: Blister N/A N/A Wounding Event: Diabetic Wound/Ulcer of the Lower N/A N/A Primary Etiology: Extremity Sleep Apnea, Coronary Artery N/A N/A Comorbid History: Disease, Hypertension, Type II Luberta, Grabinski Galena K ([id number]) [id number] Page 3 of 7 Diabetes, Neuropathy 11/22/2021 N/A N/A Date Acquired: 9 N/A N/A Weeks of Treatment: Open N/A N/A Wound Status: Yes N/A N/A Wound Recurrence: 0.3x0.1x0.3 N/A N/A Measurements L x W x D (cm)  0.024 N/A N/A A (cm) : rea 0.007 N/A N/A Volume (cm) : 98.60% N/A N/A % Reduction in A rea: 98.70% N/A N/A % Reduction in Volume: 12 Starting Position 1 (o'clock): 12 Ending Position 1 (o'clock): 0.5 Maximum Distance 1 (cm): Yes N/A N/A Undermining: Grade 2 N/A N/A Classification: Medium N/A N/A Exudate A mount: Serosanguineous N/A N/A Exudate Type: red, brown N/A N/A Exudate Color: Distinct, outline attached N/A N/A Wound Margin: Large (67-100%) N/A N/A Granulation A mount: Red, Pink N/A N/A Granulation Quality: None Present (0%) N/A N/A Necrotic A mount: Fascia: No N/A N/A Exposed Structures: Fat Layer (Subcutaneous Tissue): No Tendon: No Muscle: No Joint: No Bone: No Medium (34-66%) N/A N/A Epithelialization: Debridement - Excisional N/A N/A Debridement: Pre-procedure Verification/Time Out 09:35 N/A N/A Taken: Lidocaine 4% Topical Solution N/A N/A Pain Control: Subcutaneous, Slough N/A N/A Tissue Debrided: Skin/Subcutaneous Tissue N/A N/A Level: 0.03 N/A N/A Debridement A (sq cm): rea Curette N/A N/A Instrument: Minimum N/A N/A Bleeding: 0 N/A N/A Procedural Pain: 0 N/A N/A Post Procedural Pain: Procedure was  tolerated well N/A N/A Debridement Treatment Response: 0.3x0.1x0.3 N/A N/A Post Debridement Measurements L x W x D (cm) 0.007 N/A N/A Post Debridement Volume: (cm) Callus: Yes N/A N/A Periwound Skin Texture: Excoriation: No Induration: No Crepitus: No Rash: No Scarring: No Dry/Scaly: Yes N/A N/A Periwound Skin Moisture: Maceration: No Atrophie Blanche: No N/A N/A Periwound Skin Color: Cyanosis: No Ecchymosis: No Erythema: No Hemosiderin Staining: No Mottled: No Pallor: No Rubor: No No Abnormality N/A N/A Temperature: Yes N/A N/A Tenderness on Palpation: Cellular or Tissue Based Product N/A N/A Procedures Performed: Debridement Treatment Notes Electronic Signature(s) Signed: 01/28/2022 11:11:46 AM By: Kalman Shan DO Entered By: Kalman Shan on 01/28/2022 10:40:51 -------------------------------------------------------------------------------- Multi-Disciplinary Care Plan Details Patient Name: Date of Service: [name], [name] 01/28/2022 9:00 A Rutha Bouchard, Tehillah K ([id number]) [id number] Page 4 of 7 Medical Record Number: [id number] Patient Account Number: [id number] Date of Birth/Sex: Treating RN: [birth year] (66 y.o. [name], [name] Primary Care Kroy Sprung: [name] Other Clinician: Referring Efrata Brunner: Treating Letha Mirabal/Extender: [name] in Treatment: 17 Active Inactive Abuse / Safety / Falls / Self Care Management Nursing Diagnoses: Potential for falls Goals: Patient will remain injury free related to falls Date Initiated: 01/28/2022 Target Resolution Date: 04/08/2022 Goal Status: Active Interventions: Provide education on fall prevention Notes: Electronic Signature(s) Signed: 01/28/2022 3:28:23 PM By: Deon Pilling RN, BSN Entered By: Deon Pilling on 01/28/2022 09:21:37 -------------------------------------------------------------------------------- Pain Assessment  Details Patient Name: Date of Service: [name], [name] 01/28/2022 9:00 A M Medical Record Number: [id number] Patient Account Number: [id number] Date of Birth/Sex: Treating RN: 21-Mar-1955 (66 y.o. [name], [name] Primary Care Espyn Radwan: [name] Other Clinician: Referring Aidric Endicott: Treating Brena Windsor/Extender: [name] in Treatment: 17 Active Problems Location of Pain Severity and Description of Pain Patient Has Paino No Site Locations Pain Management and Medication Current Pain Management: Electronic Signature(s) Signed: 02/03/2022 5:16:09 PM By: Rhae Hammock RN Enriqueta Shutter, Dalaina K ([id number]) By: Rhae Hammock RN [phone].pdf Page 5 of 7 Signed: 02/03/2022 5:16:09 PM Entered By: Rhae Hammock on 01/28/2022 09:04:42 -------------------------------------------------------------------------------- Patient/Caregiver Education Details Patient Name: Date of Service: [name], [name] 12/8/2023andnbsp9:00 A M Medical Record Number: [id number] Patient Account Number: [id number] Date of Birth/Gender: Treating RN: 01-Jan-1956 (66 y.o. [name], [name] Primary Care Physician: [name] Other Clinician: Referring Physician: Treating Physician/Extender: [name] in  Treatment: 17 Education Assessment Education Provided To: Patient Education Topics Provided Wound/Skin Impairment: Handouts: Skin Care Do's and Dont's Methods: Explain/Verbal Responses: Reinforcements needed Electronic Signature(s) Signed: 01/28/2022 3:28:23 PM By: Deon Pilling RN, BSN Entered By: Deon Pilling on 01/28/2022 09:21:53 -------------------------------------------------------------------------------- Wound Assessment Details Patient Name: Date of Service: [name], [name] 01/28/2022 9:00 A M Medical Record Number: [id number] Patient Account Number: [id number] Date of  Birth/Sex: Treating RN: 05-13-1955 (66 y.o. [name], [name] Primary Care Yousef Huge: [name] Other Clinician: Referring Benen Weida: Treating Tatum Massman/Extender: [name] in Treatment: 17 Wound Status Wound Number: [id number] Primary Diabetic Wound/Ulcer of the Lower Extremity Etiology: Wound Location: Right, Lateral Foot Wound Open Wounding Event: Blister Status: Date Acquired: 11/22/2021 Comorbid Sleep Apnea, Coronary Artery Disease, Hypertension, Type II Weeks Of Treatment: 9 History: Diabetes, Neuropathy Clustered Wound: No Photos NAJAE, FILSAIME ([id number]) [id number] Page 6 of 7 Wound Measurements Length: (cm) 0.3 Width: (cm) 0.1 Depth: (cm) 0.3 Area: (cm) 0.024 Volume: (cm) 0.007 % Reduction in Area: 98.6% % Reduction in Volume: 98.7% Epithelialization: Medium (34-66%) Tunneling: No Undermining: Yes Starting Position (o'clock): 12 Ending Position (o'clock): 12 Maximum Distance: (cm) 0.5 Wound Description Classification: Grade 2 Wound Margin: Distinct, outline attached Exudate Amount: Medium Exudate Type: Serosanguineous Exudate Color: red, brown Foul Odor After Cleansing: No Slough/Fibrino No Wound Bed Granulation Amount: Large (67-100%) Exposed Structure Granulation Quality: Red, Pink Fascia Exposed: No Necrotic Amount: None Present (0%) Fat Layer (Subcutaneous Tissue) Exposed: No Tendon Exposed: No Muscle Exposed: No Joint Exposed: No Bone Exposed: No Periwound Skin Texture Texture Color No Abnormalities Noted: No No Abnormalities Noted: Yes Callus: Yes Temperature / Pain Crepitus: No Temperature: No Abnormality Excoriation: No Tenderness on Palpation: Yes Induration: No Rash: No Scarring: No Moisture No Abnormalities Noted: No Dry / Scaly: Yes Maceration: No Electronic Signature(s) Signed: 01/28/2022 3:28:23 PM By: Deon Pilling RN, BSN Signed: 02/03/2022 5:16:09 PM By:  Rhae Hammock RN Entered By: Deon Pilling on 01/28/2022 09:21:09 -------------------------------------------------------------------------------- Vitals Details Patient Name: Date of Service: Jerome, [name] 01/28/2022 9:00 A M Medical Record Number: [id number] Patient Account Number: [id number] Date of Birth/Sex: Treating RN: 1955-05-28 (66 y.o. [name], [name] Primary Care Draycen Leichter: [name] Other Clinician: Referring Vollie Aaron: Treating Deny Chevez/Extender: Henrine Screws, Allannah K ([id number]) [id number] Page 7 of 7 Weeks in Treatment: 17 Vital Signs Time Taken: 09:03 Temperature (F): 97.7 Pulse (bpm): 75 Respiratory Rate (breaths/min): 17 Blood Pressure (mmHg): 140/79 Capillary Blood Glucose (mg/dl): 97.7 Reference Range: 80 - 120 mg / dl Electronic Signature(s) Signed: 02/03/2022 5:16:09 PM By: Rhae Hammock RN Entered By: Rhae Hammock on 01/28/2022 09:05:46

## 2022-02-03 NOTE — Progress Notes (Addendum)
[name], [name] ([id number]) [id number] Page 1 of 11 Visit Report for 02/03/2022 Chief Complaint Document Details Patient Name: Date of Service: [name], [name] 02/03/2022 2:30 PM Medical Record Number: [id number] Patient Account Number: [id number] Date of Birth/Sex: Treating RN: Mar 21, 1955 (66 y.o. F) Primary Care Provider: Geoffery [name] Other Clinician: Referring Provider: Treating Provider/Extender: [name] in Treatment: 17 Information Obtained from: Patient Chief Complaint 10/27; Right Plantar foot wound 11/21; 2 small areas of skin breakdown to the right foot following cast placement 8/29; right plantar foot wound Electronic Signature(s) Signed: 02/04/2022 1:27:05 PM By: Kalman Shan DO Entered By: Kalman [name] on 02/04/2022 09:52:39 -------------------------------------------------------------------------------- Cellular or Tissue Based Product Details Patient Name: Date of Service: [name], [name] 02/03/2022 2:30 PM Medical Record Number: [id number] Patient Account Number: [id number] Date of Birth/Sex: Treating RN: [birth year] (66 y.o. [name], [name] Primary Care Provider: Geoffery [name] Other Clinician: Referring Provider: Treating Provider/Extender: [name] in Treatment: 17 Cellular or Tissue Based Product Type Wound #5R Right,Lateral Foot Applied to: Performed By: Physician Kalman Shan, DO Cellular or Tissue Based Product Type: Grafix prime Level of Consciousness (Pre-procedure): Awake and Alert Pre-procedure Verification/Time Out Yes - 15:30 Taken: Location: genitalia / hands / feet / multiple digits Wound Size (sq cm): 0.09 Product Size (sq cm): 6 Waste Size (sq cm): 0 Amount of Product Applied (sq cm): 6 Instrument Used: Forceps, Scissors Lot #: [id number] Expiration Date: 12/12/2022 Fenestrated: No Reconstituted:  Yes Solution Type: normal saline Solution Amount: 78m Lot #: [id number] Expiration Date: 07/21/2024 Secured: Yes Secured With: Steri-Strips Dressing Applied: Yes Primary Dressing: adaptic. gauze Procedural Pain: 0 Post Procedural Pain: 0 Response to Treatment: Procedure was tolerated well [name], [name]([id number] [phone] Page 2 of 11 Level of Consciousness (Post- Awake and Alert procedure): Post Procedure Diagnosis Same as Pre-procedure Electronic Signature(s) Signed: 02/03/2022 5:16:09 PM By: BRhae HammockRN Signed: 02/03/2022 5:16:42 PM By: [name] ShanDO Entered By: BRhae Hammockon 02/03/2022 15:31:39 -------------------------------------------------------------------------------- Debridement Details Patient Name: Date of Service[name] 02/03/2022 2:30 PM Medical Record Number: [id number] Account Number: [id number] of Birth/Sex: Treating RN: [birth year]((66y.o. FTonita [name] [name] Primary Care Provider: AGeoffery LyonsOther Clinician: Referring Provider: Treating Provider/Extender: HBaird Kayin Treatment: 17 Debridement Performed for Assessment: Wound #5R Right,Lateral Foot Performed By: Physician [name] Shan DO Debridement Type: Debridement Severity of Tissue Pre Debridement: Fat layer exposed Level of Consciousness (Pre-procedure): Awake and Alert Pre-procedure Verification/Time Out Yes - 15:27 Taken: Start Time: 15:27 Pain Control: Lidocaine T Area Debrided (L x W): otal 0.3 (cm) x 0.3 (cm) = 0.09 (cm) Tissue and other material debrided: Viable, Non-Viable, Callus, Slough, Subcutaneous, Slough Level: Skin/Subcutaneous Tissue Debridement Description: Excisional Instrument: Curette Bleeding: Minimum Hemostasis Achieved: Pressure End Time: 15:27 Procedural Pain: 0 Post Procedural Pain: 0 Response to Treatment: Procedure was  tolerated well Level of Consciousness (Post- Awake and Alert procedure): Post Debridement Measurements of Total Wound Length: (cm) 0.3 Width: (cm) 0.3 Depth: (cm) 0.2 Volume: (cm) 0.014 Character of Wound/Ulcer Post Debridement: Improved Severity of Tissue Post Debridement: Fat layer exposed Post Procedure Diagnosis Same as Pre-procedure Electronic Signature(s) Signed: 02/03/2022 5:16:09 PM By: BRhae HammockRN Signed: 02/03/2022 5:16:42 PM By: [name] ShanDO Entered By: BRhae Hammockon 02/03/2022 15:28:04 [name], [name] ([id number] Page 3 of 11 -------------------------------------------------------------------------------- HPI Details Patient Name: Date of Service: [name] Y40  [name], [name] 02/03/2022 2:30 PM Medical Record Number: [id number] Patient Account Number: [id number] Date of Birth/Sex: Treating RN: 10/08/1955 (66 y.o. F) Primary Care Provider: Geoffery [name] Other Clinician: Referring Provider: Treating Provider/Extender: [name] in Treatment: 17 History of Present Illness HPI Description: Admission 12/17/2020 [name] with [name] past medical history of insulin-dependent type 2 diabetes, hypothyroidism and daily1 pack per day cigarette smoker the presents to the clinic for [name] 6-week history of nonhealing wound to the right first MTPJ. She has been following with [name], podiatry for this issue. She has been using silver alginate with dressing changes. She uses [name] postsurgical shoe and offloading pads. She currently denies signs of infection. 10/31; patient presents for follow-up. She tolerated the soft cast fine although she states that she felt her foot rolling to one side. She denies signs of infection. She would like to do the total contact cast today. 12/23/2020 upon evaluation today patient appears to be doing excellent in regard to her  wound on the foot and she is in [name] total contact cast. I do think this is appropriate this is the first cast change which we are obliged to do to ensure nothing is rubbing everything appears to be doing quite well and very pleased in that regard. 11/7; patient presents for follow-up. She had no issues with the cast. She denies signs of infection. 11/14; patient presents for 1 week follow-up. She has had no issues with the cast. She denies signs of infection. 11/21; patient presents for 1 week follow-up. She did develop 2 small areas of skin breakdown on either side of the right foot from the cast rubbing. She states she did not feel the wounds developing. She currently denies signs of infection. 11/28; patient presents for 1 week follow-up. She has no issues or complaints today. She denies pain or acute signs of infection. 12/12; patient presents for follow-up. She reports improvement to her right lateral foot wound. She has been using silver alginate to the area. She has no issues or complaints today. 12/19; patient presents for follow-up. She reports that her right lateral foot wound has healed. She has no issues or complaints today. 1/3; patient presents for follow-up. She has no issues or complaints today. She reports no open wounds. Readmission 10/01/2021 Ms. Alyha Marines is [name] 66 year old female's with [name] past medical history of insulin-dependent controlled type 2 diabetes complicated by peripheral neuropathy that presents to the clinic for [name] 68-monthhistory of nonhealing ulcer to the right foot. I have seen her before for [name] wound to the same area that was treated and healed. Her wound today however this is much deeper and it has thick yellow drainage. She is not sure exactly how it started. She noticed it 1 day. She has been following with Dr. EAmalia Haileyfor this issue. She had [name] wound culture that showed [name] mix of organisms on 09/01/2021. She was recently started on doxycycline. She is using the  dConservation officer, nature She has been using Iodosorb with dressing changes. She currently denies systemic signs of infection. 8/21; patient presents for follow-up. She states she started and completed levofloxacin. She is still taking doxycycline. She reports improvement in drainage and odor. She has been doing Dakin's wet-to-dry dressings as well. She is using her front offloading shoe. She denies signs of infection. 8/29; patient presents for follow-up. She is still taking doxycycline. She continues to report improvement in drainage and reports no  odor. She denies any purulent drainage. She has been doing silver alginate to the wound bed and using her front offloading shoe. 9/5; patient presents for follow-up. She completed the course of levofloxacin. She has someone that is able to drive her today so we can place the total contact cast. She reports no signs of infection. We have been doing silver alginate to the wound bed. 9/7; patient presents for follow-up. She has tolerated the total contact cast well. She presents for her obligatory cast change. She has no issues or complaints today. 9/11; patient presents for follow-up. She tolerated the total contact cast well. She has no issues or complaints today. We discussed potentially doing [name] skin substitute and patient would like to see if her insurance will cover this. 9/18; patient presents for follow-up. She had no issues with the total contact cast. She been approved for Grafix and patient would like to have this placed today. 9/25; patient presents for follow-up. Grafix #1 was placed in standard fashion at last clinic visit. She has no issues or complaints today. She tolerated the cast well. 10/2; patient presents for follow-up. Grafix #2 was placed in standard fashion at last clinic visit. Unfortunately she developed [name] wound to the lateral aspect of the foot over the past week. She states she has been on her feet [name] lot more and walking more. She denies  systemic signs of infection. 10/9; patient presents for follow-up. Grafix #3 was placed in standard at last clinic visit. The plantar foot wound has healed with this. She developed [name] new wound to the lateral aspect last week and this has gotten larger. She reports soreness to this area. She denies systemic signs of infection. 10/16; patient presents for follow-up. Her plantar wound continues to remain closed. Her lateral wound has improved with the use of Hydrofera Blue. She has no issues or complaints today. She denies signs of infection. She has almost completed her course of antibiotics prescribed at last clinic visit. 10/23; patient presents for follow-up. She has been using Hydrofera Blue and Medihoney to the wound bed. She has no issues or complaints today. She has been using her surgical shoe. 10/30; patient presents for follow-up. We have been using Hydrofera Blue and Medihoney to the wound bed. Patient has no issues or complaints today. 11/10; patient presents for follow-up. She has been using Hydrofera Blue and Medihoney without issues. She has been wearing her Orthotics. It is unclear if she is offloading this area or not. 11/17; patient presents for follow-up. She has been using Hydrofera Blue and Medihoney to the foot wound without issues. Grafix was available today for placement and patient would like to proceed with this. [name], [name] ([id number]) [id number] Page 4 of 11 12/4; patient presents for follow-up. Grafix was placed at last clinic visit. Her wound is healed. She has no issues or complaints today. She is reported no drainage from the previous wound site. 12/8; patient states that her podiatrist removed [name] callus on the previous wound site to the lateral right foot and there is [name] wound present. She currently denies signs of infection. 12/15; patient presents for follow-up. We have been using Grafix to the wound bed. This was placed in standard  fashion today. Electronic Signature(s) Signed: 02/04/2022 1:27:05 PM By: Kalman Shan DO Entered By: Kalman [name] on 02/04/2022 09:53:24 -------------------------------------------------------------------------------- Physical Exam Details Patient Name: Date of Service: [name], [name] 02/03/2022 2:30 PM Medical Record Number: [id number] Patient Account Number: [id number] Date of  Birth/Sex: Treating RN: 1956-01-27 (66 y.o. F) Primary Care Provider: Geoffery [name] Other Clinician: Referring Provider: Treating Provider/Extender: [name] in Treatment: 17 Constitutional respirations regular, non-labored and within target range for patient.. Cardiovascular 2+ dorsalis pedis/posterior tibialis pulses. Psychiatric pleasant and cooperative. Notes T the lateral aspect of the right foot there is [name] small open wound with granulation tissue and nonviable tissue. No surrounding signs of infection including o increased warmth, erythema or purulent drainage. Electronic Signature(s) Signed: 02/04/2022 1:27:05 PM By: Kalman Shan DO Entered By: Kalman [name] on 02/04/2022 09:55:15 -------------------------------------------------------------------------------- Physician Orders Details Patient Name: Date of Service: [place], [name] 02/03/2022 2:30 PM Medical Record Number: [id number] Patient Account Number: [id number] Date of Birth/Sex: Treating RN: 11-21-1955 (66 y.o. [name], [name] Primary Care Provider: Geoffery [name] Other Clinician: Referring Provider: Treating Provider/Extender: [name] in Treatment: [phone] Verbal / Phone Orders: No Diagnosis Coding Follow-up Appointments ppointment in 1 week. - w/ Dr. Heber [place] Return [name] Anesthetic (In clinic) Topical Lidocaine 4% applied to wound bed Cellular or Tissue Based Products Cellular or Tissue Based Product Type: - Run IVR for  grafix 12/30/21 order Grafix [place], [place] ([id number]) [phone] Page 5 of 11 01/07/2022 Grafix #4 applied 01/28/2022 Grafix #5 applied 02/03/22 Grafix # 6 applied Cellular or Tissue Based Product applied to wound bed, secured with steri-strips, cover with Adaptic or Mepitel. (DO NOT REMOVE). Bathing/ Shower/ Hygiene May shower with protection but do not get wound dressing(s) wet. Edema Control - Lymphedema / SCD / Other Elevate legs to the level of the heart or above for 30 minutes daily and/or when sitting, [name] frequency of: Avoid standing for long periods of time. Wound Treatment Wound #5R - Foot Wound Laterality: Right, Lateral Cleanser: Soap and Water 1 x Per Week/7 Days Discharge Instructions: May shower and wash wound with dial antibacterial soap and water prior to dressing change. Peri-Wound Care: Skin Prep 1 x Per Week/7 Days Discharge Instructions: Use skin prep as directed Prim Dressing: Grafix PL Prime 1 x Per Week/7 Days ary Discharge Instructions: provider applies directly to wound bed. leave in place. Prim Dressing: adaptic and steri-strips 1 x Per Week/7 Days ary Discharge Instructions: covered over the Graifx. Leave in place. Secondary Dressing: Optifoam Non-Adhesive Dressing, 4x4 in 1 x Per Week/7 Days Discharge Instructions: Apply over primary dressing as directed. Secondary Dressing: Woven Gauze Sponge, Non-Sterile 4x4 in 1 x Per Week/7 Days Discharge Instructions: Apply over primary dressing as directed. Secured With: 18M Medipore H Soft Cloth Surgical T ape, 4 x 10 (in/yd) 1 x Per Week/7 Days Discharge Instructions: Secure with tape as directed. Patient Medications llergies: Documentation for allergies has not been submitted [name] Notifications Medication Indication Start End 02/04/2022 levofloxacin DOSE 1 - oral 500 mg tablet - 1 tablet oral once daily x 7 days Electronic Signature(s) Signed: 02/04/2022 1:27:05 PM By: Kalman Shan DO Previous Signature: 02/04/2022 10:00:42 AM Version By: Kalman Shan DO Previous Signature: 02/03/2022 5:16:09 PM Version By: [name] Hammock RN Previous Signature: 02/03/2022 5:16:42 PM Version By: Kalman Shan DO Entered By: Kalman [name] on 02/04/2022 10:11:01 -------------------------------------------------------------------------------- Problem List Details Patient Name: Date of Service: [place], [name] 02/03/2022 2:30 PM Medical Record Number: [id number] Patient Account Number: [id number] Date of Birth/Sex: Treating RN: 04-Jun-1955 (66 y.o. F) Primary Care Provider: Geoffery [name] Other Clinician: Referring Provider: Treating Provider/Extender: [name] in Treatment: 23 Active Problems ICD-10 Encounter  Code Description Active Date MDM Diagnosis L97.512 Non-pressure 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 10/01/2021 No Yes Lisett, [place] Timberlyn [name] ([id number]) [phone].pdf Page 6 of 11 E11.621 Type 2 diabetes mellitus with foot ulcer 10/01/2021 No Yes E11.42 Type 2 diabetes mellitus with diabetic polyneuropathy 10/01/2021 No Yes Inactive Problems Resolved Problems Electronic Signature(s) Signed: 02/04/2022 1:27:05 PM By: Kalman Shan DO Entered By: Kalman [name] on 02/04/2022 09:52:21 -------------------------------------------------------------------------------- Progress Note Details Patient Name: Date of Service: [place], [name] 02/03/2022 2:30 PM Medical Record Number: [id number] Patient Account Number: [id number] Date of Birth/Sex: Treating RN: 06/02/1955 (66 y.o. F) Primary Care Provider: Geoffery [name] Other Clinician: Referring Provider: Treating Provider/Extender: [name] in Treatment: 17 Subjective Chief Complaint Information obtained from Patient 10/27; Right Plantar foot wound 11/21; 2 small  areas of skin breakdown to the right foot following cast placement 8/29; right plantar foot wound History of Present Illness (HPI) Admission 12/17/2020 Ms. Sandie Swayze is [name] 66 year old female with [name] past medical history of insulin-dependent type 2 diabetes, hypothyroidism and daily1 pack per day cigarette smoker the presents to the clinic for [name] 6-week history of nonhealing wound to the right first MTPJ. She has been following with [name], podiatry for this issue. She has been using silver alginate with dressing changes. She uses [name] postsurgical shoe and offloading pads. She currently denies signs of infection. 10/31; patient presents for follow-up. She tolerated the soft cast fine although she states that she felt her foot rolling to one side. She denies signs of infection. She would like to do the total contact cast today. 12/23/2020 upon evaluation today patient appears to be doing excellent in regard to her wound on the foot and she is in [name] total contact cast. I do think this is appropriate this is the first cast change which we are obliged to do to ensure nothing is rubbing everything appears to be doing quite well and very pleased in that regard. 11/7; patient presents for follow-up. She had no issues with the cast. She denies signs of infection. 11/14; patient presents for 1 week follow-up. She has had no issues with the cast. She denies signs of infection. 11/21; patient presents for 1 week follow-up. She did develop 2 small areas of skin breakdown on either side of the right foot from the cast rubbing. She states she did not feel the wounds developing. She currently denies signs of infection. 11/28; patient presents for 1 week follow-up. She has no issues or complaints today. She denies pain or acute signs of infection. 12/12; patient presents for follow-up. She reports improvement to her right lateral foot wound. She has been using silver alginate to the area. She has no issues or  complaints today. 12/19; patient presents for follow-up. She reports that her right lateral foot wound has healed. She has no issues or complaints today. 1/3; patient presents for follow-up. She has no issues or complaints today. She reports no open wounds. Readmission 10/01/2021 Ms. Albertia Carvin is [name] 66 year old female's with [name] past medical history of insulin-dependent controlled type 2 diabetes complicated by peripheral neuropathy that presents to the clinic for [name] 49-monthhistory of nonhealing ulcer to the right foot. I have seen her before for [name] wound to the same area that was treated and healed. Her wound today however this is much deeper and it has thick yellow drainage. She is not sure exactly how it started. She noticed it 1  day. She has been following with [name] for this issue. She had [name] wound culture that showed [name] mix of organisms on 09/01/2021. She was recently started on doxycycline. She is using the Conservation officer, nature. She has been using Iodosorb with dressing changes. She currently denies systemic signs of infection. 8/21; patient presents for follow-up. She states she started and completed levofloxacin. She is still taking doxycycline. She reports improvement in drainage and odor. She has been doing Dakin's wet-to-dry dressings as well. She is using her front offloading shoe. She denies signs of infection. [name], [name] ([id number]) [id number] Page 7 of 11 8/29; patient presents for follow-up. She is still taking doxycycline. She continues to report improvement in drainage and reports no odor. She denies any purulent drainage. She has been doing silver alginate to the wound bed and using her front offloading shoe. 9/5; patient presents for follow-up. She completed the course of levofloxacin. She has someone that is able to drive her today so we can place the total contact cast. She reports no signs of infection. We have been doing silver alginate to the  wound bed. 9/7; patient presents for follow-up. She has tolerated the total contact cast well. She presents for her obligatory cast change. She has no issues or complaints today. 9/11; patient presents for follow-up. She tolerated the total contact cast well. She has no issues or complaints today. We discussed potentially doing [name] skin substitute and patient would like to see if her insurance will cover this. 9/18; patient presents for follow-up. She had no issues with the total contact cast. She been approved for Grafix and patient would like to have this placed today. 9/25; patient presents for follow-up. Grafix #1 was placed in standard fashion at last clinic visit. She has no issues or complaints today. She tolerated the cast well. 10/2; patient presents for follow-up. Grafix #2 was placed in standard fashion at last clinic visit. Unfortunately she developed [name] wound to the lateral aspect of the foot over the past week. She states she has been on her feet [name] lot more and walking more. She denies systemic signs of infection. 10/9; patient presents for follow-up. Grafix #3 was placed in standard at last clinic visit. The plantar foot wound has healed with this. She developed [name] new wound to the lateral aspect last week and this has gotten larger. She reports soreness to this area. She denies systemic signs of infection. 10/16; patient presents for follow-up. Her plantar wound continues to remain closed. Her lateral wound has improved with the use of Hydrofera Blue. She has no issues or complaints today. She denies signs of infection. She has almost completed her course of antibiotics prescribed at last clinic visit. 10/23; patient presents for follow-up. She has been using Hydrofera Blue and Medihoney to the wound bed. She has no issues or complaints today. She has been using her surgical shoe. 10/30; patient presents for follow-up. We have been using Hydrofera Blue and Medihoney to the wound bed.  Patient has no issues or complaints today. 11/10; patient presents for follow-up. She has been using Hydrofera Blue and Medihoney without issues. She has been wearing her Orthotics. It is unclear if she is offloading this area or not. 11/17; patient presents for follow-up. She has been using Hydrofera Blue and Medihoney to the foot wound without issues. Grafix was available today for placement and patient would like to proceed with this. 12/4; patient presents for follow-up. Grafix was placed at last clinic visit. Her wound  is healed. She has no issues or complaints today. She is reported no drainage from the previous wound site. 12/8; patient states that her podiatrist removed [name] callus on the previous wound site to the lateral right foot and there is [name] wound present. She currently denies signs of infection. 12/15; patient presents for follow-up. We have been using Grafix to the wound bed. This was placed in standard fashion today. Patient History Information obtained from Patient, Chart. Family History Unknown History. Social History Current every day smoker - 1 pack/[name]/day, Alcohol Use - Never, Drug Use - No History, Caffeine Use - Moderate. Medical History Eyes Denies history of Cataracts, Glaucoma, Optic Neuritis Ear/Nose/Mouth/Throat Denies history of Chronic sinus problems/congestion, Middle ear problems Hematologic/Lymphatic Denies history of Anemia, Hemophilia, Human Immunodeficiency Virus, Lymphedema, Sickle Cell Disease Respiratory Patient has history of Sleep Apnea Denies history of Aspiration, Asthma, Chronic Obstructive Pulmonary Disease (COPD), Pneumothorax, Tuberculosis Cardiovascular Patient has history of Coronary Artery Disease, Hypertension Denies history of Angina, Arrhythmia, Congestive Heart Failure, Deep Vein Thrombosis, Hypotension, Myocardial Infarction, Peripheral Arterial Disease, Peripheral Venous Disease, Phlebitis, Vasculitis Gastrointestinal Denies history  of Cirrhosis , Colitis, Crohnoos, Hepatitis [name], Hepatitis B, Hepatitis C Endocrine Patient has history of Type II Diabetes Denies history of Type I Diabetes Genitourinary Denies history of End Stage Renal Disease Immunological Denies history of Lupus Erythematosus, Raynaudoos, Scleroderma Integumentary (Skin) Denies history of History of Burn Musculoskeletal Denies history of Gout, Rheumatoid Arthritis, Osteoarthritis, Osteomyelitis Neurologic Patient has history of Neuropathy Denies history of Dementia, Quadriplegia, Paraplegia, Seizure Disorder Hospitalization/Surgery History - 2nd and 3rd right toe amps. - total hysterectomy. - cholecystectomy. - 2 knee surgery's on left and 1 on right. Medical [name] Surgical History Notes nd Cardiovascular [name], [name] ([id number]) [id number] Page 8 of 11 hypercholesterolemia Objective Constitutional respirations regular, non-labored and within target range for patient.. Vitals Time Taken: 2:45 PM, Temperature: 97.8 F, Pulse: 83 bpm, Respiratory Rate: 20 breaths/min, Blood Pressure: 143/76 mmHg, Capillary Blood Glucose: 124 mg/dl. Cardiovascular 2+ dorsalis pedis/posterior tibialis pulses. Psychiatric pleasant and cooperative. General Notes: T the lateral aspect of the right foot there is [name] small open wound with granulation tissue and nonviable tissue. No surrounding signs of o infection including increased warmth, erythema or purulent drainage. Integumentary (Hair, Skin) Wound #5R status is Open. Original cause of wound was Blister. The date acquired was: 11/22/2021. The wound has been in treatment 10 weeks. The wound is located on the Right,Lateral Foot. The wound measures 0.3cm length x 0.3cm width x 0.2cm depth; 0.071cm^2 area and 0.014cm^3 volume. There is no tunneling noted, however, there is undermining starting at 12:00 and ending at 12:00 with [name] maximum distance of 0.3cm. There is [name] none present amount  of drainage noted. The wound margin is distinct with the outline attached to the wound base. There is large (67-100%) red, pink granulation within the wound bed. There is [name] small (1-33%) amount of necrotic tissue within the wound bed including Adherent Slough. The periwound skin appearance had no abnormalities noted for color. The periwound skin appearance exhibited: Callus, Dry/Scaly. The periwound skin appearance did not exhibit: Crepitus, Excoriation, Induration, Rash, Scarring, Maceration. Periwound temperature was noted as No Abnormality. The periwound has tenderness on palpation. Assessment Active Problems ICD-10 Non-pressure 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 Type 2 diabetes mellitus with foot ulcer Type 2 diabetes mellitus with diabetic polyneuropathy Patient's wound is stable. I debrided nonviable tissue. Under the callus there was some trapped thick yellow drainage. No  signs of overt infection but she is high risk for amputation and I recommended an oral antibiotics. Postdebridement there is healthy granulation tissue. Grafix was placed in standard fashion. Procedures Wound #5R Pre-procedure diagnosis of Wound #5R is [name] Diabetic Wound/Ulcer of the Lower Extremity located on the Right,Lateral Foot .Severity of Tissue Pre Debridement is: Fat layer exposed. There was [name] Excisional Skin/Subcutaneous Tissue Debridement with [name] total area of 0.09 sq cm performed by Kalman Shan, DO. With the following instrument(s): Curette to remove Viable and Non-Viable tissue/material. Material removed includes Callus, Subcutaneous Tissue, and Slough after achieving pain control using Lidocaine. No specimens were taken. [name] time out was conducted at 15:27, prior to the start of the procedure. [name] Minimum amount of bleeding was controlled with Pressure. The procedure was tolerated well with [name] pain level of 0 throughout and [name] pain level of 0 following the procedure. Post  Debridement Measurements: 0.3cm length x 0.3cm width x 0.2cm depth; 0.014cm^3 volume. Character of Wound/Ulcer Post Debridement is improved. Severity of Tissue Post Debridement is: Fat layer exposed. Post procedure Diagnosis Wound #5R: Same as Pre-Procedure Pre-procedure diagnosis of Wound #5R is [name] Diabetic Wound/Ulcer of the Lower Extremity located on the Right,Lateral Foot. [name] skin graft procedure using [name] bioengineered skin substitute/cellular or tissue based product was performed by Kalman Shan, DO with the following instrument(s): Forceps and Scissors. Grafix prime was applied and secured with Steri-Strips. 6 sq cm of product was utilized and 0 sq cm was wasted. Post Application, adaptic. gauze was applied. [name] Time Out was conducted at 15:30, prior to the start of the procedure. The procedure was tolerated well with [name] pain level of 0 throughout and [name] pain level of 0 following the procedure. Post procedure Diagnosis Wound #5R: Same as Pre-Procedure . Plan [name], [name] ([id number]) [id number] Page 9 of 11 Follow-up Appointments: Return Appointment in 1 week. - w/ Dr. Heber Higginson Anesthetic: (In clinic) Topical Lidocaine 4% applied to wound bed Cellular or Tissue Based Products: Cellular or Tissue Based Product Type: - Run IVR for grafix 12/30/21 order Grafix 01/07/2022 Grafix #4 applied 01/28/2022 Grafix #5 applied 02/03/22 Grafix # 6 applied Cellular or Tissue Based Product applied to wound bed, secured with steri-strips, cover with Adaptic or Mepitel. (DO NOT REMOVE). Bathing/ Shower/ Hygiene: May shower with protection but do not get wound dressing(s) wet. Edema Control - Lymphedema / SCD / Other: Elevate legs to the level of the heart or above for 30 minutes daily and/or when sitting, [name] frequency of: Avoid standing for long periods of time. The following medication(s) was prescribed: levofloxacin oral 500 mg tablet 1 1 tablet oral once daily x 7 days  starting 02/04/2022 WOUND #5R: - Foot Wound Laterality: Right, Lateral Cleanser: Soap and Water 1 x Per Week/7 Days Discharge Instructions: May shower and wash wound with dial antibacterial soap and water prior to dressing change. Peri-Wound Care: Skin Prep 1 x Per Week/7 Days Discharge Instructions: Use skin prep as directed Prim Dressing: Grafix PL Prime 1 x Per Week/7 Days ary Discharge Instructions: provider applies directly to wound bed. leave in place. Prim Dressing: adaptic and steri-strips 1 x Per Week/7 Days ary Discharge Instructions: covered over the Graifx. Leave in place. Secondary Dressing: Optifoam Non-Adhesive Dressing, 4x4 in 1 x Per Week/7 Days Discharge Instructions: Apply over primary dressing as directed. Secondary Dressing: Woven Gauze Sponge, Non-Sterile 4x4 in 1 x Per Week/7 Days Discharge Instructions: Apply over primary dressing as directed. Secured With: 60M Medipore H Public affairs consultant Surgical  T a[place], 4 x 10 (in/yd) 1 x Per Week/7 Days Discharge Instructions: Secure with tape as directed. 1. In office sharp debridement 2. Grafix 3. Continue offloading with surgical shoe 4. Follow-up in 1 week 5. Levofloxacin Electronic Signature(s) Signed: 02/04/2022 1:27:05 PM By: Kalman Shan DO Entered By: Kalman [name] on 02/04/2022 10:34:25 -------------------------------------------------------------------------------- HxROS Details Patient Name: Date of Service: [place], [name] 02/03/2022 2:30 PM Medical Record Number: [id number] Patient Account Number: [id number] Date of Birth/Sex: Treating RN: [birth year] (66 y.o. F) Primary Care Provider: Geoffery [name] Other Clinician: Referring Provider: Treating Provider/Extender: [name] in Treatment: 17 Information Obtained From Patient Chart Eyes Medical History: Negative for: Cataracts; Glaucoma; Optic Neuritis Ear/Nose/Mouth/Throat Medical History: Negative for:  Chronic sinus problems/congestion; Middle ear problems Hematologic/Lymphatic Medical History: Negative for: Anemia; Hemophilia; Human Immunodeficiency Virus; Lymphedema; Sickle Cell Disease Respiratory Medical HistoryDARA, [name] ([id number]) [phone].pdf Page 10 of 11 Positive for: Sleep Apnea Negative for: Aspiration; Asthma; Chronic Obstructive Pulmonary Disease (COPD); Pneumothorax; Tuberculosis Cardiovascular Medical History: Positive for: Coronary Artery Disease; Hypertension Negative for: Angina; Arrhythmia; Congestive Heart Failure; Deep Vein Thrombosis; Hypotension; Myocardial Infarction; Peripheral Arterial Disease; Peripheral Venous Disease; Phlebitis; Vasculitis Past Medical History Notes: hypercholesterolemia Gastrointestinal Medical History: Negative for: Cirrhosis ; Colitis; Crohns; Hepatitis [name]; Hepatitis B; Hepatitis C Endocrine Medical History: Positive for: Type II Diabetes Negative for: Type I Diabetes Genitourinary Medical History: Negative for: End Stage Renal Disease Immunological Medical History: Negative for: Lupus Erythematosus; Raynauds; Scleroderma Integumentary (Skin) Medical History: Negative for: History of Burn Musculoskeletal Medical History: Negative for: Gout; Rheumatoid Arthritis; Osteoarthritis; Osteomyelitis Neurologic Medical History: Positive for: Neuropathy Negative for: Dementia; Quadriplegia; Paraplegia; Seizure Disorder Immunizations Pneumococcal Vaccine: Received Pneumococcal Vaccination: Yes Received Pneumococcal Vaccination On or After 60th Birthday: Yes Tetanus Vaccine: Last tetanus shot: 12/17/2020 Implantable Devices No devices added Hospitalization / Surgery History Type of Hospitalization/Surgery 2nd and 3rd right toe amps total hysterectomy cholecystectomy 2 knee surgery's on left and 1 on right Family and Social History Unknown History: Yes; Current every day smoker - 1  pack/[name]/day; Alcohol Use: Never; Drug Use: No History; Caffeine Use: Moderate; Financial Concerns: No; Food, Clothing or Shelter Needs: No; Support System Lacking: No; Transportation Concerns: No Electronic Signature(s) Signed: 02/04/2022 1:27:05 PM By: Kalman Shan DO Entered By: Kalman [name] on 02/04/2022 09:53:30 [name], [name] ([id number]) [id number] Page 11 of 11 -------------------------------------------------------------------------------- SuperBill Details Patient Name: Date of Service: [name], [name] 02/03/2022 Medical Record Number: [id number] Patient Account Number: [id number] Date of Birth/Sex: Treating RN: [birth year] (66 y.o. [name], [name] Primary Care Provider: Geoffery [name] Other Clinician: Referring Provider: Treating Provider/Extender: [name] in Treatment: 17 Diagnosis Coding ICD-10 Codes Code Description [phone] Non-pressure 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 E11.621 Type 2 diabetes mellitus with foot ulcer E11.42 Type 2 diabetes mellitus with diabetic polyneuropathy Facility Procedures : CPT4 Code: [id number] Description: Z0258- Grafix PL 2X3 sq cm Modifier: Quantity: 6 : CPT4 Code: [id number] Description: 35361 - SKIN SUB GRAFT FACE/NK/HF/G ICD-10 Diagnosis Description L97.512 Non-pressure 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 Modifier: Quantity: 1 Physician Procedures : CPT4 Code Description Modifier [id number] - WC PHYS SKIN SUB GRAFT FACE/NK/HF/G ICD-10 Diagnosis Description L97.512 Non-pressure 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 Quantity: 1 Electronic Signature(s) Signed: 02/04/2022 1:27:05 PM By: Kalman Shan DO Previous Signature: 02/03/2022 5:16:09 PM Version By: Hollie Salk  [name] RN Previous Signature: 02/03/2022 5:16:42 PM Version By: Kalman Shan DO Entered  By: Kalman [name] on 02/04/2022 10:34:43

## 2022-02-10 ENCOUNTER — Encounter (HOSPITAL_BASED_OUTPATIENT_CLINIC_OR_DEPARTMENT_OTHER): Payer: Medicare Other | Admitting: Internal Medicine

## 2022-02-10 DIAGNOSIS — F17210 Nicotine dependence, cigarettes, uncomplicated: Secondary | ICD-10-CM | POA: Diagnosis not present

## 2022-02-10 DIAGNOSIS — E11621 Type 2 diabetes mellitus with foot ulcer: Secondary | ICD-10-CM | POA: Diagnosis not present

## 2022-02-10 DIAGNOSIS — E1142 Type 2 diabetes mellitus with diabetic polyneuropathy: Secondary | ICD-10-CM | POA: Diagnosis not present

## 2022-02-10 DIAGNOSIS — L97512 Non-pressure 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: Secondary | ICD-10-CM

## 2022-02-10 DIAGNOSIS — Z794 Long term (current) use of insulin: Secondary | ICD-10-CM | POA: Diagnosis not present

## 2022-02-17 ENCOUNTER — Encounter (HOSPITAL_BASED_OUTPATIENT_CLINIC_OR_DEPARTMENT_OTHER): Payer: Medicare Other | Admitting: Internal Medicine

## 2022-02-17 DIAGNOSIS — E11621 Type 2 diabetes mellitus with foot ulcer: Secondary | ICD-10-CM | POA: Diagnosis not present

## 2022-02-17 DIAGNOSIS — L97512 Non-pressure 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: Secondary | ICD-10-CM

## 2022-02-17 DIAGNOSIS — F17210 Nicotine dependence, cigarettes, uncomplicated: Secondary | ICD-10-CM | POA: Diagnosis not present

## 2022-02-17 DIAGNOSIS — E1142 Type 2 diabetes mellitus with diabetic polyneuropathy: Secondary | ICD-10-CM | POA: Diagnosis not present

## 2022-02-17 DIAGNOSIS — Z794 Long term (current) use of insulin: Secondary | ICD-10-CM | POA: Diagnosis not present

## 2022-02-18 NOTE — Progress Notes (Signed)
YAFFA, SECKMAN ([id number]) [id number] Page 1 of 7 Visit Report for 02/17/2022 Arrival Information Details Patient Name: Date of Service: [name], [name]. 02/17/2022 3:00 PM Medical Record Number: [id number] Patient Account Number: [id number] Date of Birth/Sex: Treating RN: 03-12-1955 (66 y.o. [name], [name] Primary Care Keyandra Swenson: [name] Other Clinician: Referring [name]: Treating [name]/Extender: [name] in Treatment: 13 Visit Information History Since Last Visit Added or deleted any medications: No Patient Arrived: Ambulatory Any new allergies or adverse reactions: No Arrival Time: 15:05 Had [name] fall or experienced change in No Accompanied By: husband activities of daily living that may affect Transfer Assistance: None risk of falls: Patient Identification Verified: Yes Signs or symptoms of abuse/neglect since last visito No Secondary Verification Process Completed: Yes Hospitalized since last visit: No Patient Requires Transmission-Based Precautions: No Implantable device outside of the clinic excluding No Patient Has Alerts: No cellular tissue based products placed in the center since last visit: Has Dressing in Place as Prescribed: No Pain Present Now: No Electronic Signature(s) Signed: 02/17/2022 6:05:29 PM By: Deon Pilling RN, BSN Entered By: Deon Pilling on 02/17/2022 15:09:18 -------------------------------------------------------------------------------- Encounter Discharge Information Details Patient Name: Date of Service: [name], [name]. 02/17/2022 3:00 PM Medical Record Number: [id number] Patient Account Number: [id number] Date of Birth/Sex: Treating RN: 03-12-1955 (66 y.o. [name], [name] Primary Care Zanyiah Posten: [name] Other Clinician: Referring [name]: Treating Jhamari Markowicz/Extender: [name] in Treatment:  19 Encounter Discharge Information Items Post Procedure Vitals Discharge Condition: Stable Temperature (F): 98.7 Ambulatory Status: Ambulatory Pulse (bpm): 74 Discharge Destination: Home Respiratory Rate (breaths/min): 17 Transportation: Private Auto Blood Pressure (mmHg): 133/82 Accompanied By: husband Schedule Follow-up Appointment: Yes Clinical Summary of Care: Patient Declined Electronic Signature(s) Signed: 02/17/2022 4:52:17 PM By: Rhae Hammock RN Entered By: Rhae Hammock on 02/17/2022 15:36:01 [name], [name] ([id number]) [id number] Page 2 of 7 -------------------------------------------------------------------------------- Lower Extremity Assessment Details Patient Name: Date of Service: [name], [name]. 02/17/2022 3:00 PM Medical Record Number: [id number] Patient Account Number: [id number] Date of Birth/Sex: Treating RN: [birth year] (66 y.o. [name], [name] Primary Care Shandiin Eisenbeis: [name] Other Clinician: Referring [name]: Treating Caylah Plouff/Extender: [name] in Treatment: 19 Edema Assessment Assessed: [name]: No] Patrice Paradise: Yes] Edema: [Left: Ye] [Right: s] Calf Left: Right: Point of Measurement: From Medial Instep 39 cm Ankle Left: Right: Point of Measurement: From Medial Instep 22 cm Vascular Assessment Pulses: Dorsalis Pedis Palpable: [Right:Yes] Electronic Signature(s) Signed: 02/17/2022 6:05:29 PM By: Deon Pilling RN, BSN Entered By: Deon Pilling on 02/17/2022 15:09:43 -------------------------------------------------------------------------------- Multi Wound Chart Details Patient Name: Date of Service: [name], [name]. 02/17/2022 3:00 PM Medical Record Number: [id number] Patient Account Number: [id number] Date of Birth/Sex: Treating RN: 1955/11/08 (66 y.o. F) Primary Care Carmin Alvidrez: [name] Other Clinician: Referring Abdinasir Spadafore: Treating  Gearl Baratta/Extender: [name] in Treatment: 19 [5R:Photos:] [N/[name]:N/[name]] Right, Lateral Foot N/[name] N/[name] Wound Location: Blister N/[name] N/[name] Wounding Event: Diabetic Wound/Ulcer of the Lower N/[name] N/[name] Primary Etiology: Extremity Sleep Apnea, Coronary Artery N/[name] N/[name] Comorbid History: Disease, Hypertension, Type II Diabetes, Neuropathy RODOCKER, [name] ([id number]) [id number] Page 3 of 7 11/22/2021 N/[name] N/[name] Date Acquired: 49 N/[name] N/[name] Weeks of Treatment: Open N/[name] N/[name] Wound Status: Yes N/[name] N/[name] Wound Recurrence: 0.1x0.1x0.1 N/[name] N/[name] Measurements L x W x D (cm) 0.008 N/[name] N/[name] [name] (cm) : rea 0.001 N/[name] N/[name] Volume (cm) : 99.50% N/[name]  N/[name] % Reduction in [name] rea: 99.80% N/[name] N/[name] % Reduction in Volume: Grade 2 N/[name] N/[name] Classification: None Present N/[name] N/[name] Exudate [name] mount: Distinct, outline attached N/[name] N/[name] Wound Margin: None Present (0%) N/[name] N/[name] Granulation [name] mount: None Present (0%) N/[name] N/[name] Necrotic [name] mount: Fascia: No N/[name] N/[name] Exposed Structures: Fat Layer (Subcutaneous Tissue): No Tendon: No Muscle: No Joint: No Bone: No Large (67-100%) N/[name] N/[name] Epithelialization: Debridement - Excisional N/[name] N/[name] Debridement: Pre-procedure Verification/Time Out 15:25 N/[name] N/[name] Taken: Lidocaine N/[name] N/[name] Pain Control: Callus, Subcutaneous, Slough N/[name] N/[name] Tissue Debrided: Skin/Subcutaneous Tissue N/[name] N/[name] Level: 0.16 N/[name] N/[name] Debridement [name] (sq cm): rea Curette N/[name] N/[name] Instrument: Minimum N/[name] N/[name] Bleeding: Pressure N/[name] N/[name] Hemostasis [name] chieved: 0 N/[name] N/[name] Procedural Pain: 0 N/[name] N/[name] Post Procedural Pain: Procedure was tolerated well N/[name] N/[name] Debridement Treatment Response: 0.2x0.2x0.1 N/[name] N/[name] Post Debridement Measurements L x W x D (cm) 0.003 N/[name] N/[name] Post Debridement Volume: (cm) Callus: Yes N/[name] N/[name] Periwound Skin Texture: Excoriation: No Induration: No Crepitus: No Rash: No Scarring: No Dry/Scaly: Yes N/[name] N/[name] Periwound Skin  Moisture: Maceration: No Atrophie Blanche: No N/[name] N/[name] Periwound Skin Color: Cyanosis: No Ecchymosis: No Erythema: No Hemosiderin Staining: No Mottled: No Pallor: No Rubor: No No Abnormality N/[name] N/[name] Temperature: Yes N/[name] N/[name] Tenderness on Palpation: Cellular or Tissue Based Product N/[name] N/[name] Procedures Performed: Debridement Treatment Notes Wound #5R (Foot) Wound Laterality: Right, Lateral Cleanser Soap and Water Discharge Instruction: May shower and wash wound with dial antibacterial soap and water prior to dressing change. Peri-Wound Care Skin Prep Discharge Instruction: Use skin prep as directed Topical Primary Dressing Grafix PL Prime Discharge Instruction: Deboraha Goar applies directly to wound bed. leave in place. adaptic and steri-strips Discharge Instruction: covered over the Graifx. Leave in place. Secondary Dressing Optifoam Non-Adhesive Dressing, 4x4 in Discharge Instruction: Apply over primary dressing as directed. Woven Gauze Sponge, Non-Sterile 4x4 in [place], Myya [name] ([id number]) [id number] Page 4 of 7 Discharge Instruction: Apply over primary dressing as directed. Secured With 92M Medipore H Soft Cloth Surgical T ape, 4 x 10 (in/yd) Discharge Instruction: Secure with tape as directed. Compression Wrap Compression Stockings Add-Ons Electronic Signature(s) Signed: 02/17/2022 4:25:19 PM By: Kalman Shan DO Entered By: Kalman Shan on 02/17/2022 16:16:59 -------------------------------------------------------------------------------- Multi-Disciplinary Care Plan Details Patient Name: Date of Service: [name], [name]. 02/17/2022 3:00 PM Medical Record Number: [id number] Patient Account Number: [id number] Date of Birth/Sex: Treating RN: 1955-12-24 (66 y.o. [name], [name] Primary Care Aydien Majette: [name] Other Clinician: Referring Demetrion Wesby: Treating Murl Golladay/Extender: [name] in Treatment: 19 Active Inactive Wound/Skin Impairment Nursing Diagnoses: Impaired tissue integrity Knowledge deficit related to ulceration/compromised skin integrity Goals: Patient will have [name] decrease in wound volume by X% from date: (specify in notes) Date Initiated: 10/01/2021 Date Inactivated: 01/24/2022 Target Resolution Date: 01/22/2022 Goal Status: Met Patient/caregiver will verbalize understanding of skin care regimen Date Initiated: 10/01/2021 Date Inactivated: 01/24/2022 Target Resolution Date: 01/22/2022 Goal Status: Met Ulcer/skin breakdown will have [name] volume reduction of 30% by week 4 Date Initiated: 10/01/2021 Target Resolution Date: 02/19/2022 Goal Status: Active Interventions: Assess patient/caregiver ability to obtain necessary supplies Assess patient/caregiver ability to perform ulcer/skin care regimen upon admission and as needed Assess ulceration(s) every visit Notes: Electronic Signature(s) Signed: 02/17/2022 4:52:17 PM By: Rhae Hammock RN Entered By: Rhae Hammock on 02/17/2022 15:33:33 [name], [name] ([id number]) [id number] Page 5 of 7 -------------------------------------------------------------------------------- Pain Assessment Details Patient Name: Date of Service: [name], [name].  02/17/2022 3:00 PM Medical Record Number: [id number] Patient Account Number: [id number] Date of Birth/Sex: Treating RN: 10/04/1955 (66 y.o. [name] Primary Care Sapir Lavey: [name] Other Clinician: Referring Hazel Leveille: Treating Tomas Schamp/Extender: [name] in Treatment: 19 Active Problems Location of Pain Severity and Description of Pain Patient Has Paino No Site Locations Rate the pain. Current Pain Level: 0 Pain Management and Medication Current Pain Management: Medication: No Cold Application: No Rest: No Massage: No Activity: No T.E.N.S.: No Heat  Application: No Leg drop or elevation: No Is the Current Pain Management Adequate: Adequate How does your wound impact your activities of daily livingo Sleep: No Bathing: No Appetite: No Relationship With Others: No Bladder Continence: No Emotions: No Bowel Continence: No Work: No Toileting: No Drive: No Dressing: No Hobbies: No Engineer, maintenance) Signed: 02/17/2022 6:05:29 PM By: Deon Pilling RN, BSN Entered By: Deon Pilling on 02/17/2022 15:09:30 -------------------------------------------------------------------------------- Patient/Caregiver Education Details Patient Name: Date of Service: [name], [name] Raliegh Ip 12/28/2023andnbsp3:00 PM Medical Record Number: [id number] Patient Account Number: [id number] Date of Birth/Gender: Treating RN: 11/23/1955 (66 y.o. [name], [name] Primary Care Physician: [name] Other Clinician: Referring Physician: Treating Physician/Extender: [name] in Treatment: 35 Education Assessment Education Provided To: [name] ([id number]) [id number] Page 6 of 7 Patient Education Topics Provided Wound/Skin Impairment: Methods: Explain/Verbal Responses: Reinforcements needed, State content correctly Electronic Signature(s) Signed: 02/17/2022 4:52:17 PM By: Rhae Hammock RN Entered By: Rhae Hammock on 02/17/2022 15:34:10 -------------------------------------------------------------------------------- Wound Assessment Details Patient Name: Date of Service: [name], [name]. 02/17/2022 3:00 PM Medical Record Number: [id number] Patient Account Number: [id number] Date of Birth/Sex: Treating RN: 02/10/1956 (66 y.o. [name], [name] Primary Care Chiquetta Langner: [name] Other Clinician: Referring Paco Cislo: Treating [name]/Extender: [name] in Treatment: 19 Wound Status Wound Number: 5R Primary  Diabetic Wound/Ulcer of the Lower Extremity Etiology: Wound Location: Right, Lateral Foot Wound Open Wounding Event: Blister Status: Date Acquired: 11/22/2021 Comorbid Sleep Apnea, Coronary Artery Disease, Hypertension, Type II Weeks Of Treatment: 12 History: Diabetes, Neuropathy Clustered Wound: No Photos Wound Measurements Length: (cm) 0.1 Width: (cm) 0.1 Depth: (cm) 0.1 Area: (cm) 0.008 Volume: (cm) 0.001 % Reduction in Area: 99.5% % Reduction in Volume: 99.8% Epithelialization: Large (67-100%) Tunneling: No Undermining: No Wound Description Classification: Grade 2 Wound Margin: Distinct, outline attached Exudate Amount: None Present Foul Odor After Cleansing: No Slough/Fibrino No Wound Bed Granulation Amount: None Present (0%) Exposed Structure Necrotic Amount: None Present (0%) Fascia Exposed: No Fat Layer (Subcutaneous Tissue) Exposed: No Tendon Exposed: No Muscle Exposed: No Joint Exposed: No Bone Exposed: No Periwound Skin Texture [name], [name] ([id number]) [id number] Page 7 of 7 Texture Color No Abnormalities Noted: No No Abnormalities Noted: Yes Callus: Yes Temperature / Pain Crepitus: No Temperature: No Abnormality Excoriation: No Tenderness on Palpation: Yes Induration: No Rash: No Scarring: No Moisture No Abnormalities Noted: No Dry / Scaly: Yes Maceration: No Treatment Notes Wound #5R (Foot) Wound Laterality: Right, Lateral Cleanser Soap and Water Discharge Instruction: May shower and wash wound with dial antibacterial soap and water prior to dressing change. Peri-Wound Care Skin Prep Discharge Instruction: Use skin prep as directed Topical Primary Dressing Grafix PL Prime Discharge Instruction: [name] applies directly to wound bed. leave in place. adaptic and steri-strips Discharge Instruction: covered over the Graifx. Leave in place. Secondary Dressing Optifoam Non-Adhesive Dressing, 4x4  in Discharge Instruction: Apply over primary dressing as directed. Woven Gauze Sponge,  Non-Sterile 4x4 in Discharge Instruction: Apply over primary dressing as directed. Secured With 89M Medipore H Soft Cloth Surgical T ape, 4 x 10 (in/yd) Discharge Instruction: Secure with tape as directed. Compression Wrap Compression Stockings Add-Ons Electronic Signature(s) Signed: 02/17/2022 6:05:29 PM By: Deon Pilling RN, BSN Entered By: Deon Pilling on 02/17/2022 15:10:45

## 2022-02-18 NOTE — Progress Notes (Signed)
CIARRA, BRADDY ([id number]) [id number] Page 1 of 11 Visit Report for 02/17/2022 Chief Complaint Document Details Patient Name: Date of Service: [name], [name] 02/17/2022 3:00 PM Medical Record Number: [id number] Patient Account Number: [id number] Date of Birth/Sex: Treating RN: 1955-06-22 (66 y.o. F) Primary Care Provider: [name] Other Clinician: Referring Provider: Treating Provider/Extender: [name] in Treatment: 19 Information Obtained from: Patient Chief Complaint 10/27; Right Plantar foot wound 11/21; 2 small areas of skin breakdown to the right foot following cast placement 8/29; right plantar foot wound Electronic Signature(s) Signed: 02/17/2022 4:25:19 PM By: Kalman Shan DO Entered By: Kalman Shan on 02/17/2022 16:17:08 -------------------------------------------------------------------------------- Cellular or Tissue Based Product Details Patient Name: Date of Service: [name], [name] 02/17/2022 3:00 PM Medical Record Number: [id number] Patient Account Number: [id number] Date of Birth/Sex: Treating RN: 26-Mar-1955 (66 y.o. [name], [name] Primary Care Provider: [name] Other Clinician: Referring Provider: Treating Provider/Extender: [name] in Treatment: 19 Cellular or Tissue Based Product Type Wound #5R Right,Lateral Foot Applied to: Performed By: Physician Kalman Shan, DO Cellular or Tissue Based Product Type: Grafix prime Level of Consciousness (Pre-procedure): Awake and Alert Pre-procedure Verification/Time Out Yes - 15:30 Taken: Location: genitalia / hands / feet / multiple digits Wound Size (sq cm): 0.01 Product Size (sq cm): 2 Waste Size (sq cm): 0 Amount of Product Applied (sq cm): 2 Instrument Used: Forceps, Scissors Lot #: [id number] Expiration Date: 04/20/2023 Fenestrated: No Reconstituted:  Yes Solution Type: NS Solution Amount: 1m Lot #: [id number] Expiration Date: 07/21/2024 Secured: Yes Secured With: Steri-Strips Dressing Applied: Yes Primary Dressing: adaptic Procedural Pain: 0 Post Procedural Pain: 0 Response to Treatment: Procedure was tolerated well MSACOYA, MCGOURTY([id number]) [id number] Page 2 of 11 Level of Consciousness (Post- Awake and Alert procedure): Post Procedure Diagnosis Same as Pre-procedure Electronic Signature(s) Signed: 02/17/2022 4:25:19 PM By: HKalman ShanDO Signed: 02/17/2022 4:52:17 PM By: BRhae HammockRN Entered By: BRhae Hammockon 02/17/2022 15:32:04 -------------------------------------------------------------------------------- Debridement Details Patient Name: Date of Service: MBurkburnett [name] 02/17/2022 3:00 PM Medical Record Number: [id number] Account Number: [id number] of Birth/Sex: Treating RN: 1Jul 01, 1957((66y.o. FTonita Phoenix [name] Primary Care Provider: AGeoffery LyonsOther Clinician: Referring Provider: Treating Provider/Extender: HBaird Kayin Treatment: 19 Debridement Performed for Assessment: Wound #5R Right,Lateral Foot Performed By: Physician HKalman Shan DO Debridement Type: Debridement Severity of Tissue Pre Debridement: Fat layer exposed Level of Consciousness (Pre-procedure): Awake and Alert Pre-procedure Verification/Time Out Yes - 15:25 Taken: Start Time: 15:25 Pain Control: Lidocaine T Area Debrided (L x W): otal 0.4 (cm) x 0.4 (cm) = 0.16 (cm) Tissue and other material debrided: Viable, Non-Viable, Callus, Slough, Subcutaneous, Slough Level: Skin/Subcutaneous Tissue Debridement Description: Excisional Instrument: Curette Bleeding: Minimum Hemostasis Achieved: Pressure End Time: 15:25 Procedural Pain: 0 Post Procedural Pain: 0 Response to Treatment: Procedure was tolerated well Level of  Consciousness (Post- Awake and Alert procedure): Post Debridement Measurements of Total Wound Length: (cm) 0.2 Width: (cm) 0.2 Depth: (cm) 0.1 Volume: (cm) 0.003 Character of Wound/Ulcer Post Debridement: Improved Severity of Tissue Post Debridement: Fat layer exposed Post Procedure Diagnosis Same as Pre-procedure Electronic Signature(s) Signed: 02/17/2022 4:25:19 PM By: HKalman ShanDO Signed: 02/17/2022 4:52:17 PM By: BRhae HammockRN Entered By: BRhae Hammockon 02/17/2022 15:29:13 Laux, Nekia K ([id number]) [id number] Page 3 of 11 -------------------------------------------------------------------------------- HPI Details Patient Name: Date of Service: [name], A  [name] 02/17/2022 3:00 PM Medical Record Number: [id number] Patient Account Number: [id number] Date of Birth/Sex: Treating RN: [birth year] (66 y.o. F) Primary Care Provider: [name] Other Clinician: Referring Provider: Treating Provider/Extender: [name] in Treatment: 19 History of Present Illness HPI Description: Admission 12/17/2020 Ms. Abygayle Deltoro is a 66 year old female with a past medical history of insulin-dependent type 2 diabetes, hypothyroidism and daily1 pack per day cigarette smoker the presents to the clinic for a 6-week history of nonhealing wound to the right first MTPJ. She has been following with [name], podiatry for this issue. She has been using silver alginate with dressing changes. She uses a postsurgical shoe and offloading pads. She currently denies signs of infection. 10/31; patient presents for follow-up. She tolerated the soft cast fine although she states that she felt her foot rolling to one side. She denies signs of infection. She would like to do the total contact cast today. 12/23/2020 upon evaluation today patient appears to be doing excellent in regard to her wound on the foot and she  is in a total contact cast. I do think this is appropriate this is the first cast change which we are obliged to do to ensure nothing is rubbing everything appears to be doing quite well and very pleased in that regard. 11/7; patient presents for follow-up. She had no issues with the cast. She denies signs of infection. 11/14; patient presents for 1 week follow-up. She has had no issues with the cast. She denies signs of infection. 11/21; patient presents for 1 week follow-up. She did develop 2 small areas of skin breakdown on either side of the right foot from the cast rubbing. She states she did not feel the wounds developing. She currently denies signs of infection. 11/28; patient presents for 1 week follow-up. She has no issues or complaints today. She denies pain or acute signs of infection. 12/12; patient presents for follow-up. She reports improvement to her right lateral foot wound. She has been using silver alginate to the area. She has no issues or complaints today. 12/19; patient presents for follow-up. She reports that her right lateral foot wound has healed. She has no issues or complaints today. 1/3; patient presents for follow-up. She has no issues or complaints today. She reports no open wounds. Readmission 10/01/2021 Ms. Leoni Goodness is a 66 year old female's with a past medical history of insulin-dependent controlled type 2 diabetes complicated by peripheral neuropathy that presents to the clinic for a 1-monthhistory of nonhealing ulcer to the right foot. I have seen her before for a wound to the same area that was treated and healed. Her wound today however this is much deeper and it has thick yellow drainage. She is not sure exactly how it started. She noticed it 1 day. She has been following with Dr. EAmalia Haileyfor this issue. She had a wound culture that showed a mix of organisms on 09/01/2021. She was recently started on doxycycline. She is using the dConservation officer, nature She has been  using Iodosorb with dressing changes. She currently denies systemic signs of infection. 8/21; patient presents for follow-up. She states she started and completed levofloxacin. She is still taking doxycycline. She reports improvement in drainage and odor. She has been doing Dakin's wet-to-dry dressings as well. She is using her front offloading shoe. She denies signs of infection. 8/29; patient presents for follow-up. She is still taking doxycycline. She continues to report improvement in drainage and reports no odor. She  denies any purulent drainage. She has been doing silver alginate to the wound bed and using her front offloading shoe. 9/5; patient presents for follow-up. She completed the course of levofloxacin. She has someone that is able to drive her today so we can place the total contact cast. She reports no signs of infection. We have been doing silver alginate to the wound bed. 9/7; patient presents for follow-up. She has tolerated the total contact cast well. She presents for her obligatory cast change. She has no issues or complaints today. 9/11; patient presents for follow-up. She tolerated the total contact cast well. She has no issues or complaints today. We discussed potentially doing a skin substitute and patient would like to see if her insurance will cover this. 9/18; patient presents for follow-up. She had no issues with the total contact cast. She been approved for Grafix and patient would like to have this placed today. 9/25; patient presents for follow-up. Grafix #1 was placed in standard fashion at last clinic visit. She has no issues or complaints today. She tolerated the cast well. 10/2; patient presents for follow-up. Grafix #2 was placed in standard fashion at last clinic visit. Unfortunately she developed a wound to the lateral aspect of the foot over the past week. She states she has been on her feet a lot more and walking more. She denies systemic signs of  infection. 10/9; patient presents for follow-up. Grafix #3 was placed in standard at last clinic visit. The plantar foot wound has healed with this. She developed a new wound to the lateral aspect last week and this has gotten larger. She reports soreness to this area. She denies systemic signs of infection. 10/16; patient presents for follow-up. Her plantar wound continues to remain closed. Her lateral wound has improved with the use of Hydrofera Blue. She has no issues or complaints today. She denies signs of infection. She has almost completed her course of antibiotics prescribed at last clinic visit. 10/23; patient presents for follow-up. She has been using Hydrofera Blue and Medihoney to the wound bed. She has no issues or complaints today. She has been using her surgical shoe. 10/30; patient presents for follow-up. We have been using Hydrofera Blue and Medihoney to the wound bed. Patient has no issues or complaints today. 11/10; patient presents for follow-up. She has been using Hydrofera Blue and Medihoney without issues. She has been wearing her Orthotics. It is unclear if she is offloading this area or not. 11/17; patient presents for follow-up. She has been using Hydrofera Blue and Medihoney to the foot wound without issues. Grafix was available today for placement and patient would like to proceed with this. MAXIMA, SKELTON ([id number]) [id number] Page 4 of 11 12/4; patient presents for follow-up. Grafix was placed at last clinic visit. Her wound is healed. She has no issues or complaints today. She is reported no drainage from the previous wound site. 12/8; patient states that her podiatrist removed a callus on the previous wound site to the lateral right foot and there is a wound present. She currently denies signs of infection. 12/15; patient presents for follow-up. We have been using Grafix to the wound bed. This was placed in standard fashion  today. 12/21; patient presents for follow-up. We have been using Grafix to the wound bed. She completed her oral antibiotics. She has no issues or complaints today. 12/28; patient presents for follow-up. We have been using Grafix to the wound bed. She has reported no drainage. She is  prepared for the total contact cast today. Her husband is present. Electronic Signature(s) Signed: 02/17/2022 4:25:19 PM By: Kalman Shan DO Entered By: Kalman Shan on 02/17/2022 16:17:44 -------------------------------------------------------------------------------- Physical Exam Details Patient Name: Date of Service: [name], [name] 02/17/2022 3:00 PM Medical Record Number: [id number] Patient Account Number: [id number] Date of Birth/Sex: Treating RN: 03-13-1955 (66 y.o. F) Primary Care Provider: [name] Other Clinician: Referring Provider: Treating Provider/Extender: [name] in Treatment: 19 Constitutional respirations regular, non-labored and within target range for patient.. Cardiovascular 2+ dorsalis pedis/posterior tibialis pulses. Psychiatric pleasant and cooperative. Notes T the lateral aspect of the right foot there is a small open wound with Granulation tissue and nonviable tissue. Post-debridement healthy granulation tissue o present. No signs of surrounding infection. Electronic Signature(s) Signed: 02/17/2022 4:25:19 PM By: Kalman Shan DO Entered By: Kalman Shan on 02/17/2022 16:18:35 -------------------------------------------------------------------------------- Physician Orders Details Patient Name: Date of Service: [name], [name] 02/17/2022 3:00 PM Medical Record Number: [id number] Patient Account Number: [id number] Date of Birth/Sex: Treating RN: [birth year] (66 y.o. [name], [name] Primary Care Provider: [name] Other Clinician: Referring Provider: Treating Provider/Extender: [name] in Treatment: 81 Verbal / Phone Orders: No Diagnosis Coding Follow-up Appointments ppointment in 1 week. - w/ Dr. Heber [place] (already has an appt.) Return A Return Appointment in 2 weeks. Anesthetic JAMILEX, BOHNSACK ([id number]) [id number] Page 5 of 11 (In clinic) Topical Lidocaine 4% applied to wound bed Cellular or Tissue Based Products Cellular or Tissue Based Product Type: - Run IVR for grafix 12/30/21 order Grafix 01/07/2022 Grafix #4 applied 01/28/2022 Grafix #5 applied 02/03/22 Grafix # 6 applied 02/10/22 Grafix # 7 applied 02/17/22 Grafix # 8 applied Cellular or Tissue Based Product applied to wound bed, secured with steri-strips, cover with Adaptic or Mepitel. (DO NOT REMOVE). Edema Control - Lymphedema / SCD / Other Avoid standing for long periods of time. Off-Loading Total Contact Cast to Right Lower Extremity Wound Treatment Wound #5R - Foot Wound Laterality: Right, Lateral Cleanser: Soap and Water 1 x Per Week/7 Days Discharge Instructions: May shower and wash wound with dial antibacterial soap and water prior to dressing change. Peri-Wound Care: Skin Prep 1 x Per Week/7 Days Discharge Instructions: Use skin prep as directed Prim Dressing: Grafix PL Prime 1 x Per Week/7 Days ary Discharge Instructions: provider applies directly to wound bed. leave in place. Prim Dressing: adaptic and steri-strips 1 x Per Week/7 Days ary Discharge Instructions: covered over the Graifx. Leave in place. Secondary Dressing: Optifoam Non-Adhesive Dressing, 4x4 in 1 x Per Week/7 Days Discharge Instructions: Apply over primary dressing as directed. Secondary Dressing: Woven Gauze Sponge, Non-Sterile 4x4 in 1 x Per Week/7 Days Discharge Instructions: Apply over primary dressing as directed. Secured With: 36M Medipore H Soft Cloth Surgical T ape, 4 x 10 (in/yd) 1 x Per Week/7 Days Discharge Instructions: Secure with tape as  directed. Electronic Signature(s) Signed: 02/17/2022 4:52:17 PM By: Rhae Hammock RN Signed: 02/18/2022 10:51:29 AM By: Kalman Shan DO Previous Signature: 02/17/2022 4:25:19 PM Version By: Kalman Shan DO Entered By: Rhae Hammock on 02/17/2022 16:31:29 -------------------------------------------------------------------------------- Problem List Details Patient Name: Date of Service: [name], [name] 02/17/2022 3:00 PM Medical Record Number: [id number] Patient Account Number: [id number] Date of Birth/Sex: Treating RN: 1955/07/01 (66 y.o. F) Primary Care Provider: [name] Other Clinician: Referring Provider: Treating Provider/Extender: [name] in Treatment: 26 Active Problems ICD-10  Encounter Code Description Active Date MDM Diagnosis L97.512 Non-pressure 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 10/01/2021 No Yes E11.621 Type 2 diabetes mellitus with foot ulcer 10/01/2021 No Yes Jeneva, Schweizer Birgit K ([id number]) [phone] Page 6 of 11 E11.42 Type 2 diabetes mellitus with diabetic polyneuropathy 10/01/2021 No Yes Inactive Problems Resolved Problems Electronic Signature(s) Signed: 02/17/2022 4:25:19 PM By: Kalman Shan DO Entered By: Kalman Shan on 02/17/2022 16:16:54 -------------------------------------------------------------------------------- Progress Note Details Patient Name: Date of Service: [name], [name] 02/17/2022 3:00 PM Medical Record Number: [id number] Patient Account Number: [id number] Date of Birth/Sex: Treating RN: Jul 28, 1955 (66 y.o. F) Primary Care Provider: [name] Other Clinician: Referring Provider: Treating Provider/Extender: [name] in Treatment: 19 Subjective Chief Complaint Information obtained from Patient 10/27; Right Plantar foot wound 11/21; 2 small areas of skin  breakdown to the right foot following cast placement 8/29; right plantar foot wound History of Present Illness (HPI) Admission 12/17/2020 Ms. Tyauna Lacaze is a 66 year old female with a past medical history of insulin-dependent type 2 diabetes, hypothyroidism and daily1 pack per day cigarette smoker the presents to the clinic for a 6-week history of nonhealing wound to the right first MTPJ. She has been following with [name], podiatry for this issue. She has been using silver alginate with dressing changes. She uses a postsurgical shoe and offloading pads. She currently denies signs of infection. 10/31; patient presents for follow-up. She tolerated the soft cast fine although she states that she felt her foot rolling to one side. She denies signs of infection. She would like to do the total contact cast today. 12/23/2020 upon evaluation today patient appears to be doing excellent in regard to her wound on the foot and she is in a total contact cast. I do think this is appropriate this is the first cast change which we are obliged to do to ensure nothing is rubbing everything appears to be doing quite well and very pleased in that regard. 11/7; patient presents for follow-up. She had no issues with the cast. She denies signs of infection. 11/14; patient presents for 1 week follow-up. She has had no issues with the cast. She denies signs of infection. 11/21; patient presents for 1 week follow-up. She did develop 2 small areas of skin breakdown on either side of the right foot from the cast rubbing. She states she did not feel the wounds developing. She currently denies signs of infection. 11/28; patient presents for 1 week follow-up. She has no issues or complaints today. She denies pain or acute signs of infection. 12/12; patient presents for follow-up. She reports improvement to her right lateral foot wound. She has been using silver alginate to the area. She has no issues or complaints  today. 12/19; patient presents for follow-up. She reports that her right lateral foot wound has healed. She has no issues or complaints today. 1/3; patient presents for follow-up. She has no issues or complaints today. She reports no open wounds. Readmission 10/01/2021 Ms. Airen Dales is a 66 year old female's with a past medical history of insulin-dependent controlled type 2 diabetes complicated by peripheral neuropathy that presents to the clinic for a 40-monthhistory of nonhealing ulcer to the right foot. I have seen her before for a wound to the same area that was treated and healed. Her wound today however this is much deeper and it has thick yellow drainage. She is not sure exactly how it started. She noticed it  1 day. She has been following with [name] for this issue. She had a wound culture that showed a mix of organisms on 09/01/2021. She was recently started on doxycycline. She is using the Conservation officer, nature. She has been using Iodosorb with dressing changes. She currently denies systemic signs of infection. 8/21; patient presents for follow-up. She states she started and completed levofloxacin. She is still taking doxycycline. She reports improvement in drainage and odor. She has been doing Dakin's wet-to-dry dressings as well. She is using her front offloading shoe. She denies signs of infection. 8/29; patient presents for follow-up. She is still taking doxycycline. She continues to report improvement in drainage and reports no odor. She denies any purulent drainage. She has been doing silver alginate to the wound bed and using her front offloading shoe. 9/5; patient presents for follow-up. She completed the course of levofloxacin. She has someone that is able to drive her today so we can place the total BRAIDYN, PEACE K ([id number]) [id number] Page 7 of 11 contact cast. She reports no signs of infection. We have been doing silver alginate to the wound  bed. 9/7; patient presents for follow-up. She has tolerated the total contact cast well. She presents for her obligatory cast change. She has no issues or complaints today. 9/11; patient presents for follow-up. She tolerated the total contact cast well. She has no issues or complaints today. We discussed potentially doing a skin substitute and patient would like to see if her insurance will cover this. 9/18; patient presents for follow-up. She had no issues with the total contact cast. She been approved for Grafix and patient would like to have this placed today. 9/25; patient presents for follow-up. Grafix #1 was placed in standard fashion at last clinic visit. She has no issues or complaints today. She tolerated the cast well. 10/2; patient presents for follow-up. Grafix #2 was placed in standard fashion at last clinic visit. Unfortunately she developed a wound to the lateral aspect of the foot over the past week. She states she has been on her feet a lot more and walking more. She denies systemic signs of infection. 10/9; patient presents for follow-up. Grafix #3 was placed in standard at last clinic visit. The plantar foot wound has healed with this. She developed a new wound to the lateral aspect last week and this has gotten larger. She reports soreness to this area. She denies systemic signs of infection. 10/16; patient presents for follow-up. Her plantar wound continues to remain closed. Her lateral wound has improved with the use of Hydrofera Blue. She has no issues or complaints today. She denies signs of infection. She has almost completed her course of antibiotics prescribed at last clinic visit. 10/23; patient presents for follow-up. She has been using Hydrofera Blue and Medihoney to the wound bed. She has no issues or complaints today. She has been using her surgical shoe. 10/30; patient presents for follow-up. We have been using Hydrofera Blue and Medihoney to the wound bed. Patient  has no issues or complaints today. 11/10; patient presents for follow-up. She has been using Hydrofera Blue and Medihoney without issues. She has been wearing her Orthotics. It is unclear if she is offloading this area or not. 11/17; patient presents for follow-up. She has been using Hydrofera Blue and Medihoney to the foot wound without issues. Grafix was available today for placement and patient would like to proceed with this. 12/4; patient presents for follow-up. Grafix was placed at last clinic visit. Her  wound is healed. She has no issues or complaints today. She is reported no drainage from the previous wound site. 12/8; patient states that her podiatrist removed a callus on the previous wound site to the lateral right foot and there is a wound present. She currently denies signs of infection. 12/15; patient presents for follow-up. We have been using Grafix to the wound bed. This was placed in standard fashion today. 12/21; patient presents for follow-up. We have been using Grafix to the wound bed. She completed her oral antibiotics. She has no issues or complaints today. 12/28; patient presents for follow-up. We have been using Grafix to the wound bed. She has reported no drainage. She is prepared for the total contact cast today. Her husband is present. Patient History Information obtained from Patient, Chart. Family History Unknown History. Social History Current every day smoker - 1 pack/a/day, Alcohol Use - Never, Drug Use - No History, Caffeine Use - Moderate. Medical History Eyes Denies history of Cataracts, Glaucoma, Optic Neuritis Ear/Nose/Mouth/Throat Denies history of Chronic sinus problems/congestion, Middle ear problems Hematologic/Lymphatic Denies history of Anemia, Hemophilia, Human Immunodeficiency Virus, Lymphedema, Sickle Cell Disease Respiratory Patient has history of Sleep Apnea Denies history of Aspiration, Asthma, Chronic Obstructive Pulmonary Disease (COPD),  Pneumothorax, Tuberculosis Cardiovascular Patient has history of Coronary Artery Disease, Hypertension Denies history of Angina, Arrhythmia, Congestive Heart Failure, Deep Vein Thrombosis, Hypotension, Myocardial Infarction, Peripheral Arterial Disease, Peripheral Venous Disease, Phlebitis, Vasculitis Gastrointestinal Denies history of Cirrhosis , Colitis, Crohnoos, Hepatitis A, Hepatitis B, Hepatitis C Endocrine Patient has history of Type II Diabetes Denies history of Type I Diabetes Genitourinary Denies history of End Stage Renal Disease Immunological Denies history of Lupus Erythematosus, Raynaudoos, Scleroderma Integumentary (Skin) Denies history of History of Burn Musculoskeletal Denies history of Gout, Rheumatoid Arthritis, Osteoarthritis, Osteomyelitis Neurologic Patient has history of Neuropathy Denies history of Dementia, Quadriplegia, Paraplegia, Seizure Disorder Hospitalization/Surgery History - 2nd and 3rd right toe amps. - total hysterectomy. - cholecystectomy. - 2 knee surgery's on left and 1 on right. Medical A Surgical History Notes nd Cardiovascular ERMIE, GLENDENNING ([id number]) [id number] Page 8 of 11 hypercholesterolemia Objective Constitutional respirations regular, non-labored and within target range for patient.. Cardiovascular 2+ dorsalis pedis/posterior tibialis pulses. Psychiatric pleasant and cooperative. General Notes: T the lateral aspect of the right foot there is a small open wound with Granulation tissue and nonviable tissue. Post-debridement healthy o granulation tissue present. No signs of surrounding infection. Integumentary (Hair, Skin) Wound #5R status is Open. Original cause of wound was Blister. The date acquired was: 11/22/2021. The wound has been in treatment 12 weeks. The wound is located on the Right,Lateral Foot. The wound measures 0.1cm length x 0.1cm width x 0.1cm depth; 0.008cm^2 area and 0.001cm^3  volume. There is no tunneling or undermining noted. There is a none present amount of drainage noted. The wound margin is distinct with the outline attached to the wound base. There is no granulation within the wound bed. There is no necrotic tissue within the wound bed. The periwound skin appearance had no abnormalities noted for color. The periwound skin appearance exhibited: Callus, Dry/Scaly. The periwound skin appearance did not exhibit: Crepitus, Excoriation, Induration, Rash, Scarring, Maceration. Periwound temperature was noted as No Abnormality. The periwound has tenderness on palpation. Assessment Active Problems ICD-10 Non-pressure chronic ulcer of other part of right foot with fat layer exposed Type 2 diabetes mellitus with foot ulcer Type 2 diabetes mellitus with diabetic polyneuropathy Patient's wound remains open. I debrided nonviable tissue. Grafix was placed  in standard fashion. At this time I recommended the total contact cast As she is placing too much pressure to this area. Her husband will be driving. She recently had the total contact cast placed in our office and we will place extra padding to the areas that have required this. No need to come back in 48 hours. Follow-up in 1 week. Procedures Wound #5R Pre-procedure diagnosis of Wound #5R is a Diabetic Wound/Ulcer of the Lower Extremity located on the Right,Lateral Foot .Severity of Tissue Pre Debridement is: Fat layer exposed. There was a Excisional Skin/Subcutaneous Tissue Debridement with a total area of 0.16 sq cm performed by Kalman Shan, DO. With the following instrument(s): Curette to remove Viable and Non-Viable tissue/material. Material removed includes Callus, Subcutaneous Tissue, and Slough after achieving pain control using Lidocaine. No specimens were taken. A time out was conducted at 15:25, prior to the start of the procedure. A Minimum amount of bleeding was controlled with Pressure. The procedure was  tolerated well with a pain level of 0 throughout and a pain level of 0 following the procedure. Post Debridement Measurements: 0.2cm length x 0.2cm width x 0.1cm depth; 0.003cm^3 volume. Character of Wound/Ulcer Post Debridement is improved. Severity of Tissue Post Debridement is: Fat layer exposed. Post procedure Diagnosis Wound #5R: Same as Pre-Procedure Pre-procedure diagnosis of Wound #5R is a Diabetic Wound/Ulcer of the Lower Extremity located on the Right,Lateral Foot. A skin graft procedure using a bioengineered skin substitute/cellular or tissue based product was performed by Kalman Shan, DO with the following instrument(s): Forceps and Scissors. Grafix prime was applied and secured with Steri-Strips. 2 sq cm of product was utilized and 0 sq cm was wasted. Post Application, adaptic was applied. A Time Out was conducted at 15:30, prior to the start of the procedure. The procedure was tolerated well with a pain level of 0 throughout and a pain level of 0 following the procedure. Post procedure Diagnosis Wound #5R: Same as Pre-Procedure . Plan Follow-up Appointments: Return Appointment in 1 week. - w/ Dr. Heber [place] (already has an appt.) Anesthetic: (In clinic) Topical Lidocaine 4% applied to wound bed Myha, Arizpe Mirai K ([id number]) [phone].pdf Page 9 of 11 Cellular or Tissue Based Products: Cellular or Tissue Based Product Type: - Run IVR for grafix 12/30/21 order Grafix 01/07/2022 Grafix #4 applied 01/28/2022 Grafix #5 applied 02/03/22 Grafix # 6 applied 02/10/22 Grafix # 7 applied 02/17/22 Grafix # 8 applied Cellular or Tissue Based Product applied to wound bed, secured with steri-strips, cover with Adaptic or Mepitel. (DO NOT REMOVE). Edema Control - Lymphedema / SCD / Other: Avoid standing for long periods of time. Off-Loading: T Contact Cast to Right Lower Extremity otal WOUND #5R: - Foot Wound Laterality: Right, Lateral Cleanser: Soap and Water 1  x Per Week/7 Days Discharge Instructions: May shower and wash wound with dial antibacterial soap and water prior to dressing change. Peri-Wound Care: Skin Prep 1 x Per Week/7 Days Discharge Instructions: Use skin prep as directed Prim Dressing: Grafix PL Prime 1 x Per Week/7 Days ary Discharge Instructions: provider applies directly to wound bed. leave in place. Prim Dressing: adaptic and steri-strips 1 x Per Week/7 Days ary Discharge Instructions: covered over the Graifx. Leave in place. Secondary Dressing: Optifoam Non-Adhesive Dressing, 4x4 in 1 x Per Week/7 Days Discharge Instructions: Apply over primary dressing as directed. Secondary Dressing: Woven Gauze Sponge, Non-Sterile 4x4 in 1 x Per Week/7 Days Discharge Instructions: Apply over primary dressing as directed. Secured With: [place]  [facility] T ape, 4 x 10 (in/yd) 1 x Per Week/7 Days Discharge Instructions: Secure with tape as directed. 1. In office sharp debridement 2. Grafix placed in standard fashion 3. T contact cast otal 4. Follow-up in 1 week Electronic Signature(s) Signed: 02/17/2022 4:25:19 PM By: Kalman Shan DO Entered By: Kalman Shan on 02/17/2022 16:21:09 -------------------------------------------------------------------------------- HxROS Details Patient Name: Date of Service: [name], [name] 02/17/2022 3:00 PM Medical Record Number: [id number] Patient Account Number: [id number] Date of Birth/Sex: Treating RN: 04/16/1955 (66 y.o. F) Primary Care Provider: [name] Other Clinician: Referring Provider: Treating Provider/Extender: [name] in Treatment: 19 Information Obtained From Patient Chart Eyes Medical History: Negative for: Cataracts; Glaucoma; Optic Neuritis Ear/Nose/Mouth/Throat Medical History: Negative for: Chronic sinus problems/congestion; Middle ear problems Hematologic/Lymphatic Medical History: Negative for:  Anemia; Hemophilia; Human Immunodeficiency Virus; Lymphedema; Sickle Cell Disease Respiratory Medical History: Positive for: Sleep Apnea Negative for: Aspiration; Asthma; Chronic Obstructive Pulmonary Disease (COPD); Pneumothorax; Tuberculosis Cardiovascular Medical History: Positive for: Coronary Artery Disease; Hypertension Gayle, Martinez Jnae K ([id number]) [phone].pdf Page 10 of 11 Negative for: Angina; Arrhythmia; Congestive Heart Failure; Deep Vein Thrombosis; Hypotension; Myocardial Infarction; Peripheral Arterial Disease; Peripheral Venous Disease; Phlebitis; Vasculitis Past Medical History Notes: hypercholesterolemia Gastrointestinal Medical History: Negative for: Cirrhosis ; Colitis; Crohns; Hepatitis A; Hepatitis B; Hepatitis C Endocrine Medical History: Positive for: Type II Diabetes Negative for: Type I Diabetes Genitourinary Medical History: Negative for: End Stage Renal Disease Immunological Medical History: Negative for: Lupus Erythematosus; Raynauds; Scleroderma Integumentary (Skin) Medical History: Negative for: History of Burn Musculoskeletal Medical History: Negative for: Gout; Rheumatoid Arthritis; Osteoarthritis; Osteomyelitis Neurologic Medical History: Positive for: Neuropathy Negative for: Dementia; Quadriplegia; Paraplegia; Seizure Disorder Immunizations Pneumococcal Vaccine: Received Pneumococcal Vaccination: Yes Received Pneumococcal Vaccination On or After 60th Birthday: Yes Tetanus Vaccine: Last tetanus shot: 12/17/2020 Implantable Devices No devices added Hospitalization / Surgery History Type of Hospitalization/Surgery 2nd and 3rd right toe amps total hysterectomy cholecystectomy 2 knee surgery's on left and 1 on right Family and Social History Unknown History: Yes; Current every day smoker - 1 pack/a/day; Alcohol Use: Never; Drug Use: No History; Caffeine Use: Moderate; Financial Concerns: No; Food, Clothing  or Shelter Needs: No; Support System Lacking: No; Transportation Concerns: No Electronic Signature(s) Signed: 02/17/2022 4:25:19 PM By: Kalman Shan DO Entered By: Kalman Shan on 02/17/2022 16:17:51 Lage, Gabrianna K ([id number]) [id number] Page 11 of 11 -------------------------------------------------------------------------------- SuperBill Details Patient Name: Date of Service: [name], [name] 02/17/2022 Medical Record Number: [id number] Patient Account Number: [id number] Date of Birth/Sex: Treating RN: May 10, 1955 (66 y.o. [name], [name] Primary Care Provider: [name] Other Clinician: Referring Provider: Treating Provider/Extender: [name] in Treatment: 19 Diagnosis Coding ICD-10 Codes Code Description [phone] Non-pressure chronic ulcer of other part of right foot with fat layer exposed E11.621 Type 2 diabetes mellitus with foot ulcer E11.42 Type 2 diabetes mellitus with diabetic polyneuropathy Facility Procedures : CPT4 Code: [id number] Description: Q4133- Grafix PL 16 mm disc (2 units) Modifier: Quantity: 2 : CPT4 Code: [id number] Description: 14970 - SKIN SUB GRAFT FACE/NK/HF/G ICD-10 Diagnosis Description L97.512 Non-pressure chronic ulcer of other part of right foot with fat layer exposed E11.621 Type 2 diabetes mellitus with foot ulcer Modifier: Quantity: 1 Physician Procedures : CPT4 Code Description Modifier [id number] - WC PHYS SKIN SUB GRAFT FACE/NK/HF/G ICD-10 Diagnosis Description L97.512 Non-pressure chronic ulcer of other part of right foot with fat layer exposed E11.621 Type 2 diabetes mellitus with foot ulcer  Quantity: 1 Electronic Signature(s) Signed: 02/17/2022 4:25:19 PM By: Kalman Shan DO Entered By: Kalman Shan on 02/17/2022 16:21:16

## 2022-02-24 ENCOUNTER — Encounter (HOSPITAL_BASED_OUTPATIENT_CLINIC_OR_DEPARTMENT_OTHER): Payer: Medicare Other | Attending: Internal Medicine | Admitting: Internal Medicine

## 2022-02-24 DIAGNOSIS — L97512 Non-pressure chronic ulcer of other part of right foot with fat layer exposed: Secondary | ICD-10-CM | POA: Diagnosis not present

## 2022-02-24 DIAGNOSIS — Z794 Long term (current) use of insulin: Secondary | ICD-10-CM | POA: Insufficient documentation

## 2022-02-24 DIAGNOSIS — E11621 Type 2 diabetes mellitus with foot ulcer: Secondary | ICD-10-CM

## 2022-02-24 DIAGNOSIS — E1142 Type 2 diabetes mellitus with diabetic polyneuropathy: Secondary | ICD-10-CM | POA: Insufficient documentation

## 2022-02-24 DIAGNOSIS — F17210 Nicotine dependence, cigarettes, uncomplicated: Secondary | ICD-10-CM | POA: Insufficient documentation

## 2022-02-24 NOTE — Progress Notes (Signed)
AERIAL, DILLEY ([id number]) [id number] Page 1 of 7 Visit Report for 12/20/2021 Arrival Information Details Patient Name: Date of Service: [name], [name] Muse [name] 12/20/2021 8:45 [name] Medical Record Number: [id number] Patient Account Number: [id number] Date of Birth/Sex: Treating [name]: 13-Apr-1955 (67 y.o. F) Primary Care [name]: [name] Other Clinician: Referring [name]: Treating [name]/Extender: [name] in Treatment: 11 Visit Information History Since Last Visit Added or deleted any medications: No Patient Arrived: Ambulatory Any new allergies or adverse reactions: No Arrival Time: 08:48 Had [name] fall or experienced change in No Accompanied By: self activities of daily living that may affect Transfer Assistance: None risk of falls: Patient Identification Verified: Yes Signs or symptoms of abuse/neglect since last visito No Secondary Verification Process Completed: Yes Hospitalized since last visit: No Patient Requires Transmission-Based Precautions: No Implantable device outside of the clinic excluding No Patient Has Alerts: No cellular tissue based products placed in the center since last visit: Has Dressing in Place as Prescribed: Yes Pain Present Now: No Electronic Signature(s) Signed: 12/20/2021 4:23:13 PM By: Erenest [name] Entered By: Erenest [name] on 12/20/2021 08:49:52 -------------------------------------------------------------------------------- Encounter Discharge Information Details Patient Name: Date of Service: [name] YNA [name], [name] [name] 12/20/2021 8:45 [name] Medical Record Number: [id number] Patient Account Number: [id number] Date of Birth/Sex: Treating [name]: 05-06-1955 (67 y.o. [name], [name] Primary Care [name]: [name] Other Clinician: Referring [name]: Treating [name]: [name] in Treatment: 11 Encounter Discharge  Information Items Discharge Condition: Stable Ambulatory Status: Ambulatory Discharge Destination: Home Transportation: Private Auto Accompanied By: self Schedule Follow-up Appointment: Yes Clinical Summary of Care: Patient Declined Electronic Signature(s) Signed: 02/23/2022 5:36:43 PM By: [name] Entered By: [name] on 12/20/2021 09:09:09 Hodo, [name] ([id number]) [id number] Page 2 of 7 -------------------------------------------------------------------------------- Lower Extremity Assessment Details Patient Name: Date of Service: [name] 12/20/2021 8:45 [name] Medical Record Number: [id number] Patient Account Number: [id number] Date of Birth/Sex: Treating [name]: 1955-11-19 (67 y.o. F) Primary Care [name]: [name] Other Clinician: Referring [name]: Treating [name]/Extender: [name] in Treatment: 11 Edema Assessment Assessed: [name]: No] [Right: No] Edema: [Left: Ye] [Right: s] Calf Left: Right: Point of Measurement: From Medial Instep 45 cm Ankle Left: Right: Point of Measurement: From Medial Instep 24 cm Vascular Assessment Pulses: Dorsalis Pedis Palpable: [Right:Yes] Electronic Signature(s) Signed: 12/20/2021 4:23:13 PM By: Erenest [name] Entered By: Erenest [name] on 12/20/2021 08:57:17 -------------------------------------------------------------------------------- Multi Wound Chart Details Patient Name: Date of Service: [name] YNA [name], [name] [name] 12/20/2021 8:45 [name] Medical Record Number: [id number] Patient Account Number: [id number] Date of Birth/Sex: Treating [name]: June 01, 1955 (67 y.o. F) Primary Care [name]: [name] Other Clinician: Referring [name]: Treating [name]/Extender: [name] in Treatment: 11 Vital Signs Height(in): Capillary Blood Glucose(mg/dl): 140 Weight(lbs): Pulse(bpm): 9 Body Mass  Index(BMI): Blood Pressure(mmHg): 139/79 Temperature(F): 98.3 Respiratory Rate(breaths/min): 18 [5:Photos:] [N/[name]:N/[name]] Right, Lateral Foot N/[name] N/[name] Wound Location: Blister N/[name] N/[name] Wounding Event: Diabetic Wound/Ulcer of the Lower N/[name] N/[name] Primary Etiology: Extremity Sleep Apnea, Coronary Artery N/[name] N/[name] Comorbid History: Disease, Hypertension, Type II Diabetes, Neuropathy 11/22/2021 N/[name] N/[name] Date Acquired: 4 N/[name] N/[name] Weeks of Treatment: Open N/[name] N/[name] Wound Status: No N/[name] N/[name] Wound Recurrence: 0.4x0.5x0.2 N/[name] N/[name] Measurements L x W x D (cm) 0.157 N/[name] N/[name] [name] (cm) : rea 0.031 N/[name] N/[name] Volume (cm) : 91.10% N/[name] N/[name] % Reduction in [name] rea: 94.20% N/[name] N/[name] %  Reduction in Volume: Grade 2 N/[name] N/[name] Classification: Medium N/[name] N/[name] Exudate [name] mount: Serosanguineous N/[name] N/[name] Exudate Type: red, brown N/[name] N/[name] Exudate Color: Distinct, outline attached N/[name] N/[name] Wound Margin: Small (1-33%) N/[name] N/[name] Granulation [name] mount: Pink, Pale N/[name] N/[name] Granulation Quality: Large (67-100%) N/[name] N/[name] Necrotic [name] mount: Fat Layer (Subcutaneous Tissue): Yes N/[name] N/[name] Exposed Structures: Fascia: No Tendon: No Muscle: No Joint: No Bone: No Small (1-33%) N/[name] N/[name] Epithelialization: Debridement - Excisional N/[name] N/[name] Debridement: Pre-procedure Verification/Time Out 09:18 N/[name] N/[name] Taken: Lidocaine N/[name] N/[name] Pain Control: Subcutaneous, Slough N/[name] N/[name] Tissue Debrided: Skin/Subcutaneous Tissue N/[name] N/[name] Level: 0.2 N/[name] N/[name] Debridement [name] (sq cm): rea Curette N/[name] N/[name] Instrument: Minimum N/[name] N/[name] Bleeding: Pressure N/[name] N/[name] Hemostasis [name] chieved: 0 N/[name] N/[name] Procedural Pain: 0 N/[name] N/[name] Post Procedural Pain: Procedure was tolerated well N/[name] N/[name] Debridement Treatment Response: 0.4x0.5x0.2 N/[name] N/[name] Post Debridement Measurements L x W x D (cm) 0.031 N/[name] N/[name] Post Debridement Volume: (cm) Callus: Yes N/[name] N/[name] Periwound Skin Texture: Excoriation: No Induration: No Crepitus: No Rash: No Scarring: No Maceration:  No N/[name] N/[name] Periwound Skin Moisture: Dry/Scaly: No Atrophie Blanche: No N/[name] N/[name] Periwound Skin Color: Cyanosis: No Ecchymosis: No Erythema: No Hemosiderin Staining: No Mottled: No Pallor: No Rubor: No No Abnormality N/[name] N/[name] Temperature: Debridement N/[name] N/[name] Procedures Performed: Treatment Notes Wound #5 (Foot) Wound Laterality: Right, Lateral Cleanser Soap and Water Discharge Instruction: May shower and wash wound with dial antibacterial soap and water prior to dressing change. Peri-Wound Care Topical Primary Dressing Hydrofera Blue Ready Foam, 2.5 x2.5 in Discharge Instruction: Apply to wound bed as instructed MediHoney Gel, tube 1.5 (oz) Discharge Instruction: Apply to wound bed as instructed [name], [name] ([id number]) [id number] Page 4 of 7 Secondary Dressing Optifoam Non-Adhesive Dressing, 4x4 in Discharge Instruction: Apply over primary dressing as directed. Woven Gauze Sponge, Non-Sterile 4x4 in Discharge Instruction: Apply over primary dressing as directed. Secured With 84M Medipore H Soft Cloth Surgical T ape, 4 x 10 (in/yd) Discharge Instruction: Secure with tape as directed. Compression Wrap Compression Stockings Add-Ons Electronic Signature(s) Signed: 12/20/2021 2:03:41 PM By: [name] Entered By: [name] Shan on 12/20/2021 09:41:08 -------------------------------------------------------------------------------- Multi-Disciplinary Care Plan Details Patient Name: Date of Service: [name] YNA [name], [name] [name] 12/20/2021 8:45 [name] Medical Record Number: [id number] Patient Account Number: [id number] Date of Birth/Sex: Treating [name]: 01-Sep-1955 (67 y.o. [name], [name] Primary Care Candita Borenstein: [name] Other Clinician: Referring Lanier Millon: Treating Vaanya Shambaugh/Extender: [name] in Treatment: 11 Active Inactive Wound/Skin Impairment Nursing Diagnoses: Impaired tissue  integrity Knowledge deficit related to ulceration/compromised skin integrity Goals: Patient will have [name] decrease in wound volume by X% from date: (specify in notes) Date Initiated: 10/01/2021 Target Resolution Date: 01/22/2022 Goal Status: Active Patient/caregiver will verbalize understanding of skin care regimen Date Initiated: 10/01/2021 Target Resolution Date: 01/22/2022 Goal Status: Active Ulcer/skin breakdown will have [name] volume reduction of 30% by week 4 Date Initiated: 10/01/2021 Target Resolution Date: 01/21/2022 Goal Status: Active Interventions: Assess patient/caregiver ability to obtain necessary supplies Assess patient/caregiver ability to perform ulcer/skin care regimen upon admission and as needed Assess ulceration(s) every visit Notes: Electronic Signature(s) Signed: 02/23/2022 5:36:43 PM By: [name] Entered By: [name] on 12/20/2021 09:05:33 [name], [name] ([id number]) [id number] Page 5 of 7 -------------------------------------------------------------------------------- Pain Assessment Details Patient Name: Date of Service: [name] [name] May [name] 12/20/2021 8:45 [name] Medical Record Number: [id number] Patient Account Number: [id number] Date of Birth/Sex: Treating [name]: Aug 18, 1955 (67 y.o.  F) Primary Care Phyllip Claw: [name] Other Clinician: Referring Essam Lowdermilk: Treating Elspeth Blucher/Extender: [name] in Treatment: 11 Active Problems Location of Pain Severity and Description of Pain Patient Has Paino No Site Locations Pain Management and Medication Current Pain Management: Electronic Signature(s) Signed: 12/20/2021 4:23:13 PM By: Erenest [name] Entered By: Erenest [name] on 12/20/2021 08:52:38 -------------------------------------------------------------------------------- Patient/Caregiver Education Details Patient Name: Date of Service: [name] YNA [name], [name] [name]  10/30/2023andnbsp8:45 [name] Medical Record Number: [id number] Patient Account Number: [id number] Date of Birth/Gender: Treating [name]: 08-Apr-1955 (67 y.o. [name], [name] Primary Care Physician: [name] Other Clinician: Referring Physician: Treating Physician/Extender: [name] in Treatment: 11 Education Assessment Education Provided To: Patient Education Topics Provided Basic Hygiene: Methods: Explain/Verbal Responses: State content correctly [employer]) [name], [name] ([id number]) [id number] Page 6 of 7 Signed: 02/23/2022 5:36:43 PM By: [name] Entered By: [name] on 12/20/2021 09:05:43 -------------------------------------------------------------------------------- Wound Assessment Details Patient Name: Date of Service: [name] YNA [name], [name] [name] 12/20/2021 8:45 [name] Medical Record Number: [id number] Patient Account Number: [id number] Date of Birth/Sex: Treating [name]: [birth year] (67 y.o. F) Primary Care Lakisa Lotz: [name] Other Clinician: Referring Chiniqua Kilcrease: Treating Joseph Bias/Extender: [name] in Treatment: 11 Wound Status Wound Number: 5 Primary Diabetic Wound/Ulcer of the Lower Extremity Etiology: Wound Location: Right, Lateral Foot Wound Open Wounding Event: Blister Status: Date Acquired: 11/22/2021 Comorbid Sleep Apnea, Coronary Artery Disease, Hypertension, Type II Weeks Of Treatment: 4 History: Diabetes, Neuropathy Clustered Wound: No Photos Wound Measurements Length: (cm) 0.4 Width: (cm) 0.5 Depth: (cm) 0.2 Area: (cm) 0.157 Volume: (cm) 0.031 % Reduction in Area: 91.1% % Reduction in Volume: 94.2% Epithelialization: Small (1-33%) Tunneling: No Undermining: No Wound Description Classification: Grade 2 Wound Margin: Distinct, outline attached Exudate Amount: Medium Exudate Type: Serosanguineous Exudate  Color: red, brown Foul Odor After Cleansing: No Slough/Fibrino Yes Wound Bed Granulation Amount: Small (1-33%) Exposed Structure Granulation Quality: Pink, Pale Fascia Exposed: No Necrotic Amount: Large (67-100%) Fat Layer (Subcutaneous Tissue) Exposed: Yes Necrotic Quality: Adherent Slough Tendon Exposed: No Muscle Exposed: No Joint Exposed: No Bone Exposed: No Periwound Skin Texture Texture Color No Abnormalities Noted: No No Abnormalities Noted: No Callus: Yes Atrophie Blanche: No Crepitus: No Cyanosis: No Excoriation: No Ecchymosis: No Induration: No Erythema: No Rash: No Hemosiderin Staining: No Scarring: No Mottled: No [name], [name] ([id number]) [id number] Page 7 of 7 Pallor: No Moisture Rubor: No No Abnormalities Noted: No Dry / Scaly: No Temperature / Pain Maceration: No Temperature: No Abnormality Electronic Signature(s) Signed: 12/20/2021 4:23:13 PM By: Erenest [name] Entered By: Erenest [name] on 12/20/2021 08:58:03 -------------------------------------------------------------------------------- Vitals Details Patient Name: Date of Service: [name], [name] [name] 12/20/2021 8:45 [name] Medical Record Number: [id number] Patient Account Number: [id number] Date of Birth/Sex: Treating [name]: 1955/03/08 (67 y.o. F) Primary Care Emerald Shor: [name] Other Clinician: Referring Graceanna Theissen: Treating Aarion Metzgar/Extender: [name] in Treatment: 11 Vital Signs Time Taken: 08:51 Temperature (F): 98.3 Pulse (bpm): 83 Respiratory Rate (breaths/min): 18 Blood Pressure (mmHg): 139/79 Capillary Blood Glucose (mg/dl): 140 Reference Range: 80 - 120 mg / dl Electronic Signature(s) Signed: 12/20/2021 4:23:13 PM By: Erenest [name] Entered By: Erenest [name] on 12/20/2021 08:52:24

## 2022-02-24 NOTE — Progress Notes (Signed)
[name] ([id number]) [id number] Page 1 of 10 Visit Report for 12/20/2021 Chief Complaint Document Details Patient Name: Date of Service: [name] 12/20/2021 8:45 [name] Medical Record Number: [id number] Patient Account Number: [id number] Date of Birth/Sex: Treating RN: 12-16-1955 (67 y.o. F) Primary Care Provider: [name] Other Clinician: Referring Provider: Treating Provider/Extender: [name] in Treatment: 11 Information Obtained from: Patient Chief Complaint 10/27; Right Plantar foot wound 11/21; 2 small areas of skin breakdown to the right foot following cast placement 8/29; right plantar foot wound Electronic Signature(s) Signed: 12/20/2021 2:03:41 PM By: Kalman Shan DO Entered By: Kalman Shan on 12/20/2021 09:41:25 -------------------------------------------------------------------------------- Debridement Details Patient Name: Date of Service: [name], [name] 12/20/2021 8:45 [name] Medical Record Number: [id number] Patient Account Number: [id number] Date of Birth/Sex: Treating RN: 09/23/1955 (67 y.o. [name], [name] Primary Care Provider: [name] Other Clinician: Referring Provider: Treating Provider/Extender: [name] in Treatment: 11 Debridement Performed for Assessment: Wound #5 Right,Lateral Foot Performed By: Physician Kalman Shan, DO Debridement Type: Debridement Severity of Tissue Pre Debridement: Fat layer exposed Level of Consciousness (Pre-procedure): Awake and Alert Pre-procedure Verification/Time Out Yes - 09:18 Taken: Start Time: 09:18 Pain Control: Lidocaine T Area Debrided (L x W): otal 0.4 (cm) x 0.5 (cm) = 0.2 (cm) Tissue and other material debrided: Viable, Non-Viable, Slough, Subcutaneous, Slough Level: Skin/Subcutaneous Tissue Debridement Description: Excisional Instrument:  Curette Bleeding: Minimum Hemostasis Achieved: Pressure End Time: 09:18 Procedural Pain: 0 Post Procedural Pain: 0 Response to Treatment: Procedure was tolerated well Level of Consciousness (Post- Awake and Alert procedure): Post Debridement Measurements of Total Wound Length: (cm) 0.4 Width: (cm) 0.5 Depth: (cm) 0.2 Volume: (cm) 0.031 [name], [name] ([id number]) [id number] Page 2 of 10 Character of Wound/Ulcer Post Debridement: Improved Severity of Tissue Post Debridement: Fat layer exposed Post Procedure Diagnosis Same as Pre-procedure Electronic Signature(s) Signed: 12/20/2021 2:03:41 PM By: Kalman Shan DO Signed: 02/23/2022 5:36:43 PM By: Rhae Hammock RN Entered By: Rhae Hammock on 12/20/2021 09:20:19 -------------------------------------------------------------------------------- HPI Details Patient Name: Date of Service: [name], [name] 12/20/2021 8:45 [name] Medical Record Number: [id number] Patient Account Number: [id number] Date of Birth/Sex: Treating RN: 19-Sep-1955 (67 y.o. F) Primary Care Provider: [name] Other Clinician: Referring Provider: Treating Provider/Extender: [name] in Treatment: 11 History of Present Illness HPI Description: Admission 12/17/2020 [name] with [name] past medical history of insulin-dependent type 2 diabetes, hypothyroidism and daily1 pack per day cigarette smoker the presents to the clinic for [name] 6-week history of nonhealing wound to the right first MTPJ. She has been following with [name], podiatry for this issue. She has been using silver alginate with dressing changes. She uses [name] postsurgical shoe and offloading pads. She currently denies signs of infection. 10/31; patient presents for follow-up. She tolerated the soft cast fine although she states that she felt her foot rolling to one side. She denies  signs of infection. She would like to do the total contact cast today. 12/23/2020 upon evaluation today patient appears to be doing excellent in regard to her wound on the foot and she is in [name] total contact cast. I do think this is appropriate this is the first cast change which we are obliged to do to ensure nothing is rubbing everything appears to be doing quite well and very pleased in that regard. 11/7; patient presents  for follow-up. She had no issues with the cast. She denies signs of infection. 11/14; patient presents for 1 week follow-up. She has had no issues with the cast. She denies signs of infection. 11/21; patient presents for 1 week follow-up. She did develop 2 small areas of skin breakdown on either side of the right foot from the cast rubbing. She states she did not feel the wounds developing. She currently denies signs of infection. 11/28; patient presents for 1 week follow-up. She has no issues or complaints today. She denies pain or acute signs of infection. 12/12; patient presents for follow-up. She reports improvement to her right lateral foot wound. She has been using silver alginate to the area. She has no issues or complaints today. 12/19; patient presents for follow-up. She reports that her right lateral foot wound has healed. She has no issues or complaints today. 1/3; patient presents for follow-up. She has no issues or complaints today. She reports no open wounds. Readmission 10/01/2021 [name]'s with [name] past medical history of insulin-dependent controlled type 2 diabetes complicated by peripheral neuropathy that presents to the clinic for [name] 35-monthhistory of nonhealing ulcer to the right foot. I have seen her before for [name] wound to the same area that was treated and healed. Her wound today however this is much deeper and it has thick yellow drainage. She is not sure exactly how it started. She noticed it 1 day. She has been following  with Dr. EAmalia Haileyfor this issue. She had [name] wound culture that showed [name] mix of organisms on 09/01/2021. She was recently started on doxycycline. She is using the dConservation officer, nature She has been using Iodosorb with dressing changes. She currently denies systemic signs of infection. 8/21; patient presents for follow-up. She states she started and completed levofloxacin. She is still taking doxycycline. She reports improvement in drainage and odor. She has been doing Dakin's wet-to-dry dressings as well. She is using her front offloading shoe. She denies signs of infection. 8/29; patient presents for follow-up. She is still taking doxycycline. She continues to report improvement in drainage and reports no odor. She denies any purulent drainage. She has been doing silver alginate to the wound bed and using her front offloading shoe. 9/5; patient presents for follow-up. She completed the course of levofloxacin. She has someone that is able to drive her today so we can place the total contact cast. She reports no signs of infection. We have been doing silver alginate to the wound bed. 9/7; patient presents for follow-up. She has tolerated the total contact cast well. She presents for her obligatory cast change. She has no issues or complaints today. 9/11; patient presents for follow-up. She tolerated the total contact cast well. She has no issues or complaints today. We discussed potentially doing [name] skin substitute and patient would like to see if her insurance will cover this. 9/18; patient presents for follow-up. She had no issues with the total contact cast. She been approved for Grafix and patient would like to have this placed today. MRHYLYNN, PERDOMO([id number] Page 3 of 10 9/25; patient presents for follow-up. Grafix #1 was placed in standard fashion at last clinic visit. She has no issues or complaints today. She tolerated the cast well. 10/2; patient presents  for follow-up. Grafix #2 was placed in standard fashion at last clinic visit. Unfortunately she developed [name] wound to the lateral aspect of the foot over the past week. She states she  has been on her feet [name] lot more and walking more. She denies systemic signs of infection. 10/9; patient presents for follow-up. Grafix #3 was placed in standard at last clinic visit. The plantar foot wound has healed with this. She developed [name] new wound to the lateral aspect last week and this has gotten larger. She reports soreness to this area. She denies systemic signs of infection. 10/16; patient presents for follow-up. Her plantar wound continues to remain closed. Her lateral wound has improved with the use of Hydrofera Blue. She has no issues or complaints today. She denies signs of infection. She has almost completed her course of antibiotics prescribed at last clinic visit. 10/23; patient presents for follow-up. She has been using Hydrofera Blue and Medihoney to the wound bed. She has no issues or complaints today. She has been using her surgical shoe. 10/30; patient presents for follow-up. We have been using Hydrofera Blue and Medihoney to the wound bed. Patient has no issues or complaints today. Electronic Signature(s) Signed: 12/20/2021 2:03:41 PM By: Kalman Shan DO Entered By: Kalman Shan on 12/20/2021 09:41:47 -------------------------------------------------------------------------------- Physical Exam Details Patient Name: Date of Service: [name], [name] 12/20/2021 8:45 [name] Medical Record Number: [id number] Patient Account Number: [id number] Date of Birth/Sex: Treating RN: 02/28/1955 (67 y.o. F) Primary Care Provider: [name] Other Clinician: Referring Provider: Treating Provider/Extender: [name] in Treatment: 11 Constitutional respirations regular, non-labored and within target range for patient.. Cardiovascular 2+ dorsalis  pedis/posterior tibialis pulses. Psychiatric pleasant and cooperative. Notes T the lateral aspect of the right foot there is an open wound with nonviable tissue and granulation tissue. No increased warmth, erythema or purulent drainage o noted. Electronic Signature(s) Signed: 12/20/2021 2:03:41 PM By: Kalman Shan DO Entered By: Kalman Shan on 12/20/2021 09:42:13 -------------------------------------------------------------------------------- Physician Orders Details Patient Name: Date of Service: [name], [name] 12/20/2021 8:45 [name] Medical Record Number: [id number] Patient Account Number: [id number] Date of Birth/Sex: Treating RN: Jul 09, 1955 (67 y.o. [name], [name] Primary Care Provider: [name] Other Clinician: Referring Provider: Treating Provider/Extender: [name] in Treatment: 11 Verbal / Phone Orders: No Diagnosis [address] [name], [name] ([id number]) [id number] Page 4 of 10 Follow-up Appointments ppointment in 1 week. - w/ Dr. Heber [place] Thursday or Friday Return [name] Cellular or Tissue Based Products Cellular or Tissue Based Product Type: - Run IVR for grafix Bathing/ Shower/ Hygiene May shower with protection but do not get wound dressing(s) wet. Edema Control - Lymphedema / SCD / Other Elevate legs to the level of the heart or above for 30 minutes daily and/or when sitting, [name] frequency of: Avoid standing for long periods of time. Wound Treatment Wound #5 - Foot Wound Laterality: Right, Lateral Cleanser: Soap and Water 1 x Per Week/7 Days Discharge Instructions: May shower and wash wound with dial antibacterial soap and water prior to dressing change. Prim Dressing: Hydrofera Blue Ready Foam, 2.5 x2.5 in 1 x Per Week/7 Days ary Discharge Instructions: Apply to wound bed as instructed Prim Dressing: MediHoney Gel, tube 1.5 (oz) 1 x Per Week/7 Days ary Discharge Instructions:  Apply to wound bed as instructed Secondary Dressing: Optifoam Non-Adhesive Dressing, 4x4 in 1 x Per Week/7 Days Discharge Instructions: Apply over primary dressing as directed. Secondary Dressing: Woven Gauze Sponge, Non-Sterile 4x4 in 1 x Per Week/7 Days Discharge Instructions: Apply over primary dressing as directed. Secured With: 19M Medipore H Soft Cloth Surgical T ape, 4  x 10 (in/yd) 1 x Per Week/7 Days Discharge Instructions: Secure with tape as directed. Electronic Signature(s) Signed: 12/20/2021 2:03:41 PM By: Kalman Shan DO Entered By: Kalman Shan on 12/20/2021 09:42:20 -------------------------------------------------------------------------------- Problem List Details Patient Name: Date of Service: [name], [name] 12/20/2021 8:45 [name] Medical Record Number: [id number] Patient Account Number: [id number] Date of Birth/Sex: Treating RN: 1955-09-20 (67 y.o. 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 12/20/2021 8:45 [name] Medical Record Number: [id number] Patient Account Number: [id number] Date of Birth/Sex: Treating  RN: [birth year] (67 y.o. F) Primary Care Provider: [name] Other Clinician: Referring Provider: Treating Provider/Extender: [name] in Treatment: 11 Subjective Chief Complaint Information obtained from Patient 10/27; Right Plantar foot wound 11/21; 2 small areas of skin breakdown to the right foot following cast placement 8/29; right plantar foot wound History of Present Illness (HPI) Admission 12/17/2020 Ms. Angelia Hazell is [name] 67 year old female with [name] past medical history of insulin-dependent type 2 diabetes, hypothyroidism and daily1 pack per day cigarette smoker the presents to the clinic for [name] 6-week history of nonhealing wound to the right first MTPJ. She has been following with [name], podiatry for this issue. She has been using silver alginate with dressing changes. She uses [name] postsurgical shoe and offloading pads. She currently denies signs of infection. 10/31; patient presents for follow-up. She tolerated the soft cast fine although she states that she felt her foot rolling to one side. She denies signs of infection. She would like to do the total contact cast today. 12/23/2020 upon evaluation today patient appears to be doing excellent in regard to her wound on the foot and she is in [name] total contact cast. I do think this is appropriate this is the first cast change which we are obliged to do to ensure nothing is rubbing everything appears to be doing quite well and very pleased in that regard. 11/7; patient presents for follow-up. She had no issues with the cast. She denies signs of infection. 11/14; patient presents for 1 week follow-up. She has had no issues with the cast. She denies signs of infection. 11/21; patient presents for 1 week follow-up. She did develop 2 small areas of skin breakdown on either side of the right foot from the cast rubbing. She states she did not feel the wounds developing. She currently denies signs of  infection. 11/28; patient presents for 1 week follow-up. She has no issues or complaints today. She denies pain or acute signs of infection. 12/12; patient presents for follow-up. She reports improvement to her right lateral foot wound. She has been using silver alginate to the area. She has no issues or complaints today. 12/19; patient presents for follow-up. She reports that her right lateral foot wound has healed. She has no issues or complaints today. 1/3; patient presents for follow-up. She  has no issues or complaints today. She reports no open wounds. Readmission 10/01/2021 [name]'s with [name] past medical history of insulin-dependent controlled type 2 diabetes complicated by peripheral neuropathy that presents to the clinic for [name] 110-monthhistory of nonhealing ulcer to the right foot. I have seen her before for [name] wound to the same area that was treated and healed. Her wound today however this is much deeper and it has thick yellow drainage. She is not sure exactly how it started. She noticed it 1 day. She has been following with Dr. EAmalia Haileyfor this issue. She had [name] wound culture that showed [name] mix of organisms on 09/01/2021. She was recently started on doxycycline. She is using the dConservation officer, nature She has been using Iodosorb with dressing changes. She currently denies systemic signs of infection. 8/21; patient presents for follow-up. She states she started and completed levofloxacin. She is still taking doxycycline. She reports improvement in drainage and odor. She has been doing Dakin's wet-to-dry dressings as well. She is using her front offloading shoe. She denies signs of infection. 8/29; patient presents for follow-up. She is still taking doxycycline. She continues to report improvement in drainage and reports no odor. She denies any purulent drainage. She has been doing silver alginate to the wound bed and using her front offloading shoe. 9/5; patient presents  for follow-up. She completed the course of levofloxacin. She has someone that is able to drive her today so we can place the total contact cast. She reports no signs of infection. We have been doing silver alginate to the wound bed. 9/7; patient presents for follow-up. She has tolerated the total contact cast well. She presents for her obligatory cast change. She has no issues or complaints today. 9/11; patient presents for follow-up. She tolerated the total contact cast well. She has no issues or complaints today. We discussed potentially doing [name] skin substitute and patient would like to see if her insurance will cover this. 9/18; patient presents for follow-up. She had no issues with the total contact cast. She been approved for Grafix and patient would like to have this placed today. MDALONDA, SIMONI([id number] Page 6 of 10 9/25; patient presents for follow-up. Grafix #1 was placed in standard fashion at last clinic visit. She has no issues or complaints today. She tolerated the cast well. 10/2; patient presents for follow-up. Grafix #2 was placed in standard fashion at last clinic visit. Unfortunately she developed [name] wound to the lateral aspect of the foot over the past week. She states she has been on her feet [name] lot more and walking more. She denies systemic signs of infection. 10/9; patient presents for follow-up. Grafix #3 was placed in standard at last clinic visit. The plantar foot wound has healed with this. She developed [name] new wound to the lateral aspect last week and this has gotten larger. She reports soreness to this area. She denies systemic signs of infection. 10/16; patient presents for follow-up. Her plantar wound continues to remain closed. Her lateral wound has improved with the use of Hydrofera Blue. She has no issues or complaints today. She denies signs of infection. She has almost completed her course of antibiotics prescribed at  last clinic visit. 10/23; patient presents for follow-up. She has been using Hydrofera Blue and Medihoney to the wound bed. She has no issues or complaints today. She has been using her surgical shoe. 10/30; patient presents for follow-up. We have been using  Hydrofera Blue and Medihoney to the wound bed. Patient has no issues or complaints today. Patient History Information obtained from Patient, Chart. Family History Unknown History. Social History Current every day smoker - 1 pack/[name]/day, Alcohol Use - Never, Drug Use - No History, Caffeine Use - Moderate. Medical History Eyes Denies history of Cataracts, Glaucoma, Optic Neuritis Ear/Nose/Mouth/Throat Denies history of Chronic sinus problems/congestion, Middle ear problems Hematologic/Lymphatic Denies history of Anemia, Hemophilia, Human Immunodeficiency Virus, Lymphedema, Sickle Cell Disease Respiratory Patient has history of Sleep Apnea Denies history of Aspiration, Asthma, Chronic Obstructive Pulmonary Disease (COPD), Pneumothorax, Tuberculosis Cardiovascular Patient has history of Coronary Artery Disease, Hypertension Denies history of Angina, Arrhythmia, Congestive Heart Failure, Deep Vein Thrombosis, Hypotension, Myocardial Infarction, Peripheral Arterial Disease, Peripheral Venous Disease, Phlebitis, Vasculitis Gastrointestinal Denies history of Cirrhosis , Colitis, Crohnoos, Hepatitis [name], Hepatitis B, Hepatitis C Endocrine Patient has history of Type II Diabetes Denies history of Type I Diabetes Genitourinary Denies history of End Stage Renal Disease Immunological Denies history of Lupus Erythematosus, Raynaudoos, Scleroderma Integumentary (Skin) Denies history of History of Burn Musculoskeletal Denies history of Gout, Rheumatoid Arthritis, Osteoarthritis, Osteomyelitis Neurologic Patient has history of Neuropathy Denies history of Dementia, Quadriplegia, Paraplegia, Seizure Disorder Hospitalization/Surgery History -  2nd and 3rd right toe amps. - total hysterectomy. - cholecystectomy. - 2 knee surgery's on left and 1 on right. Medical [name] Surgical History Notes nd Cardiovascular hypercholesterolemia Objective Constitutional respirations regular, non-labored and within target range for patient.. Vitals Time Taken: 8:51 AM, Temperature: 98.3 F, Pulse: 83 bpm, Respiratory Rate: 18 breaths/min, Blood Pressure: 139/79 mmHg, Capillary Blood Glucose: 140 mg/dl. Cardiovascular 2+ dorsalis pedis/posterior tibialis pulses. Psychiatric pleasant and cooperative. [name], [name] ([id number]) [id number] Page 7 of 10 General Notes: T the lateral aspect of the right foot there is an open wound with nonviable tissue and granulation tissue. No increased warmth, erythema or o purulent drainage noted. Integumentary (Hair, Skin) Wound #5 status is Open. Original cause of wound was Blister. The date acquired was: 11/22/2021. The wound has been in treatment 4 weeks. The wound is located on the Right,Lateral Foot. The wound measures 0.4cm length x 0.5cm width x 0.2cm depth; 0.157cm^2 area and 0.031cm^3 volume. There is Fat Layer (Subcutaneous Tissue) exposed. There is no tunneling or undermining noted. There is [name] medium amount of serosanguineous drainage noted. The wound margin is distinct with the outline attached to the wound base. There is small (1-33%) pink, pale granulation within the wound bed. There is [name] large (67-100%) amount of necrotic tissue within the wound bed including Adherent Slough. The periwound skin appearance exhibited: Callus. The periwound skin appearance did not exhibit: Crepitus, Excoriation, Induration, Rash, Scarring, Dry/Scaly, Maceration, Atrophie Blanche, Cyanosis, Ecchymosis, Hemosiderin Staining, Mottled, Pallor, Rubor, Erythema. Periwound temperature was noted as No Abnormality. Assessment Active Problems ICD-10 Non-pressure chronic ulcer of other part of right  foot with fat layer exposed Type 2 diabetes mellitus with foot ulcer Type 2 diabetes mellitus with diabetic polyneuropathy Patient's wound has shown improvement in size and appearance since last clinic visit. I debrided nonviable tissue. At this time I think she would benefit from [name] skin substitute. Earlier in this admission we had used Grafix to the plantar foot wound and she had done well with this. We will run an IVR for Grafix again for the right lateral foot wound. For now she can continue with Medihoney and Hydrofera Blue and aggressive offloading. Follow-up in 1 week Procedures Wound #5 Pre-procedure diagnosis of Wound #5 is [name] Diabetic Wound/Ulcer of the  Lower Extremity located on the Right,Lateral Foot .Severity of Tissue Pre Debridement is: Fat layer exposed. There was [name] Excisional Skin/Subcutaneous Tissue Debridement with [name] total area of 0.2 sq cm performed by Kalman Shan, DO. With the following instrument(s): Curette to remove Viable and Non-Viable tissue/material. Material removed includes Subcutaneous Tissue and Slough and after achieving pain control using Lidocaine. No specimens were taken. [name] time out was conducted at 09:18, prior to the start of the procedure. [name] Minimum amount of bleeding was controlled with Pressure. The procedure was tolerated well with [name] pain level of 0 throughout and [name] pain level of 0 following the procedure. Post Debridement Measurements: 0.4cm length x 0.5cm width x 0.2cm depth; 0.031cm^3 volume. Character of Wound/Ulcer Post Debridement is improved. Severity of Tissue Post Debridement is: Fat layer exposed. Post procedure Diagnosis Wound #5: Same as Pre-Procedure Plan Follow-up Appointments: Return Appointment in 1 week. - w/ Dr. Heber Laclede Thursday or Friday Cellular or Tissue Based Products: Cellular or Tissue Based Product Type: - Run IVR for grafix Bathing/ Shower/ Hygiene: May shower with protection but do not get wound dressing(s) wet. Edema  Control - Lymphedema / SCD / Other: Elevate legs to the level of the heart or above for 30 minutes daily and/or when sitting, [name] frequency of: Avoid standing for long periods of time. WOUND #5: - Foot Wound Laterality: Right, Lateral Cleanser: Soap and Water 1 x Per Week/7 Days Discharge Instructions: May shower and wash wound with dial antibacterial soap and water prior to dressing change. Prim Dressing: Hydrofera Blue Ready Foam, 2.5 x2.5 in 1 x Per Week/7 Days ary Discharge Instructions: Apply to wound bed as instructed Prim Dressing: MediHoney Gel, tube 1.5 (oz) 1 x Per Week/7 Days ary Discharge Instructions: Apply to wound bed as instructed Secondary Dressing: Optifoam Non-Adhesive Dressing, 4x4 in 1 x Per Week/7 Days Discharge Instructions: Apply over primary dressing as directed. Secondary Dressing: Woven Gauze Sponge, Non-Sterile 4x4 in 1 x Per Week/7 Days Discharge Instructions: Apply over primary dressing as directed. Secured With: 41M Medipore H Soft Cloth Surgical T ape, 4 x 10 (in/yd) 1 x Per Week/7 Days Discharge Instructions: Secure with tape as directed. 1. In office sharp debridement 2. Medihoney and Hydrofera Blue 3. Run IVR for Grafix 4. Follow-up in 1 week 5. Aggressive offloadingoosurgical shoe and donut pad [name], [name] ([id number]) [id number] Page 8 of 10 Electronic Signature(s) Signed: 12/20/2021 2:03:41 PM By: Kalman Shan DO Entered By: Kalman Shan on 12/20/2021 09:44:21 -------------------------------------------------------------------------------- HxROS Details Patient Name: Date of Service: [name], [name] 12/20/2021 8:45 [name] Medical Record Number: [id number] Patient Account Number: [id number] Date of Birth/Sex: Treating RN: 1956-01-24 (67 y.o. F) Primary Care Provider: [name] Other Clinician: Referring Provider: Treating Provider/Extender: [name] in  Treatment: 11 Information Obtained From Patient Chart Eyes Medical History: Negative for: Cataracts; Glaucoma; Optic Neuritis Ear/Nose/Mouth/Throat Medical History: Negative for: Chronic sinus problems/congestion; Middle ear problems Hematologic/Lymphatic Medical History: Negative for: Anemia; Hemophilia; Human Immunodeficiency Virus; Lymphedema; Sickle Cell Disease Respiratory Medical History: Positive for: Sleep Apnea Negative for: Aspiration; Asthma; Chronic Obstructive Pulmonary Disease (COPD); Pneumothorax; Tuberculosis Cardiovascular Medical History: Positive for: Coronary Artery Disease; Hypertension Negative for: Angina; Arrhythmia; Congestive Heart Failure; Deep Vein Thrombosis; Hypotension; Myocardial Infarction; Peripheral Arterial Disease; Peripheral Venous Disease; Phlebitis; Vasculitis Past Medical History Notes: hypercholesterolemia Gastrointestinal Medical History: Negative for: Cirrhosis ; Colitis; Crohns; Hepatitis [name]; Hepatitis B; Hepatitis C Endocrine Medical History: Positive for: Type II Diabetes Negative for: Type  I Diabetes Genitourinary Medical History: Negative for: End Stage Renal Disease Immunological Medical History: Negative for: Lupus Erythematosus; Raynauds; Scleroderma Integumentary (Skin) [name], [name] ([id number]) [id number] Page 9 of 10 Medical History: Negative for: History of Burn Musculoskeletal Medical History: Negative for: Gout; Rheumatoid Arthritis; Osteoarthritis; Osteomyelitis Neurologic Medical History: Positive for: Neuropathy Negative for: Dementia; Quadriplegia; Paraplegia; Seizure Disorder Immunizations Pneumococcal Vaccine: Received Pneumococcal Vaccination: Yes Received Pneumococcal Vaccination On or After 60th Birthday: Yes Tetanus Vaccine: Last tetanus shot: 12/17/2020 Implantable Devices No devices added Hospitalization / Surgery History Type of Hospitalization/Surgery 2nd  and 3rd right toe amps total hysterectomy cholecystectomy 2 knee surgery's on left and 1 on right Family and Social History Unknown History: Yes; Current every day smoker - 1 pack/[name]/day; Alcohol Use: Never; Drug Use: No History; Caffeine Use: Moderate; Financial Concerns: No; Food, Clothing or Shelter Needs: No; Support System Lacking: No; Transportation Concerns: No Electronic Signature(s) Signed: 12/20/2021 2:03:41 PM By: Kalman Shan DO Entered By: Kalman Shan on 12/20/2021 09:41:54 -------------------------------------------------------------------------------- SuperBill Details Patient Name: Date of Service: [name], [name] 12/20/2021 Medical Record Number: [id number] Patient Account Number: [id number] Date of Birth/Sex: Treating RN: 1955/09/29 (67 y.o. F) Primary Care Provider: [name] Other Clinician: Referring Provider: Treating Provider/Extender: [name] in Treatment: 11 Diagnosis Coding ICD-10 Codes Code Description [id number] Non-pressure chronic ulcer of other part of right foot with fat layer exposed E11.621 Type 2 diabetes mellitus with foot ulcer E11.42 Type 2 diabetes mellitus with diabetic polyneuropathy Facility Procedures Physician Procedures : CPT4 Code Description Modifier [id number] - WC PHYS SUBQ TISS 20 SQ CM ICD-10 Diagnosis Description L97.512 Non-pressure chronic ulcer of other part of right foot with fat layer exposed E11.621 Type 2 diabetes mellitus with foot ulcer Quantity: 1 Electronic Signature(s) Signed: 12/20/2021 2:03:41 PM By: Kalman Shan DO Entered By: Kalman Shan on 12/20/2021 09:44:30

## 2022-02-24 NOTE — Progress Notes (Signed)
[name], [name] ([id number]) [id number] Page 1 of 11 Visit Report for 02/10/2022 Chief Complaint Document Details Patient Name: Date of Service: [name], [name] [name] 02/10/2022 12:30 PM Medical Record Number: [id number] Patient Account Number: [id number] Date of Birth/Sex: Treating RN: 10-17-1955 (67 y.o. F) Primary Care Provider: [name] Other Clinician: Referring Provider: Treating Provider/Extender: [name] in Treatment: 18 Information Obtained from: Patient Chief Complaint 10/27; Right Plantar foot wound 11/21; 2 small areas of skin breakdown to the right foot following cast placement 8/29; right plantar foot wound Electronic Signature(s) Signed: 02/10/2022 2:00:17 PM By: Kalman Shan DO Entered By: Kalman Shan on 02/10/2022 13:45:05 -------------------------------------------------------------------------------- Cellular or Tissue Based Product Details Patient Name: Date of Service: [name], [name] 02/10/2022 12:30 PM Medical Record Number: [id number] Patient Account Number: [id number] Date of Birth/Sex: Treating RN: [birth year] (66 y.o. Tonita [name], [name] Primary Care Provider: [name] Other Clinician: Referring Provider: Treating Provider/Extender: [name] in Treatment: 18 Cellular or Tissue Based Product Type Wound #5R Right,Lateral Foot Applied to: Performed By: Physician Kalman Shan, DO Cellular or Tissue Based Product Type: Grafix prime Level of Consciousness (Pre-procedure): Awake and Alert Pre-procedure Verification/Time Out Yes - 13:20 Taken: Location: genitalia / hands / feet / multiple digits Wound Size (sq cm): 0.01 Product Size (sq cm): 2 Waste Size (sq cm): 0 Amount of Product Applied (sq cm): 2 Instrument Used: Forceps, Scissors Lot #: [id number] Expiration Date: 04/10/2023 Fenestrated: No Reconstituted:  Yes Solution Type: normal saline Solution Amount: 29m Lot #: [id number] Expiration Date: 07/21/2024 Secured: Yes Secured With: Steri-Strips Dressing Applied: Yes Primary Dressing: adaptic. gauze Procedural Pain: 0 Post Procedural Pain: 0 Response to Treatment: Procedure was tolerated well MBRENDAN, GRUWELL([id number] Page 2 of 11 Level of Consciousness (Post- Awake and Alert procedure): Post Procedure Diagnosis Same as Pre-procedure Electronic Signature(s) Signed: 02/10/2022 2:00:17 PM By: HKalman ShanDO Signed: 02/23/2022 5:35:40 PM By: BRhae HammockRN Entered By: BRhae Hammockon 02/10/2022 13:21:37 -------------------------------------------------------------------------------- Debridement Details Patient Name: Date of Service[name] 02/10/2022 12:30 PM Medical Record Number: [id number] Account Number: [id number] of Birth/Sex: Treating RN: [birth year]((67y.o. FTonita [name] [name] Primary Care Provider: AGeoffery LyonsOther Clinician: Referring Provider: Treating Provider/Extender: HBaird Kayin Treatment: 18 Debridement Performed for Assessment: Wound #5R Right,Lateral Foot Performed By: Physician HKalman Shan DO Debridement Type: Debridement Severity of Tissue Pre Debridement: Fat layer exposed Level of Consciousness (Pre-procedure): Awake and Alert Pre-procedure Verification/Time Out Yes - 13:10 Taken: Start Time: 13:10 Pain Control: Lidocaine T Area Debrided (L x W): otal 0.1 (cm) x 0.1 (cm) = 0.01 (cm) Tissue and other material debrided: Viable, Non-Viable, Callus, Skin: Dermis , Skin: Epidermis Level: Skin/Epidermis Debridement Description: Selective/Open Wound Instrument: Curette Bleeding: Minimum Hemostasis Achieved: Pressure End Time: 13:10 Procedural Pain: 0 Post Procedural Pain: 0 Response to Treatment: Procedure was  tolerated well Level of Consciousness (Post- Awake and Alert procedure): Post Debridement Measurements of Total Wound Length: (cm) 0.2 Width: (cm) 0.2 Depth: (cm) 0.2 Volume: (cm) 0.006 Character of Wound/Ulcer Post Debridement: Improved Severity of Tissue Post Debridement: Fat layer exposed Post Procedure Diagnosis Same as Pre-procedure Electronic Signature(s) Signed: 02/10/2022 2:00:17 PM By: HKalman ShanDO Signed: 02/23/2022 5:35:40 PM By: BRhae HammockRN Entered By: BRhae Hammockon 02/10/2022 13:11:07 Bye, [name] ([id number] Page 3 of 11 -------------------------------------------------------------------------------- HPI Details Patient Name: Date of Service:  [name], [name] 02/10/2022 12:30 PM Medical Record Number: [id number] Patient Account Number: [id number] Date of Birth/Sex: Treating RN: 1955-06-10 (67 y.o. F) Primary Care Provider: [name] Other Clinician: Referring Provider: Treating Provider/Extender: [name] in Treatment: 18 History of Present Illness HPI Description: Admission 12/17/2020 [name] a 67 year old female with a past medical history of insulin-dependent type 2 diabetes, hypothyroidism and daily1 pack per day cigarette smoker the presents to the clinic for a 6-week history of nonhealing wound to the right first MTPJ. She has been following with [name], podiatry for this issue. She has been using silver alginate with dressing changes. She uses a postsurgical shoe and offloading pads. She currently denies signs of infection. 10/31; patient presents for follow-up. She tolerated the soft cast fine although she states that she felt her foot rolling to one side. She denies signs of infection. She would like to do the total contact cast today. 12/23/2020 upon evaluation today patient appears to be doing excellent in regard to her  wound on the foot and she is in a total contact cast. I do think this is appropriate this is the first cast change which we are obliged to do to ensure nothing is rubbing everything appears to be doing quite well and very pleased in that regard. 11/7; patient presents for follow-up. She had no issues with the cast. She denies signs of infection. 11/14; patient presents for 1 week follow-up. She has had no issues with the cast. She denies signs of infection. 11/21; patient presents for 1 week follow-up. She did develop 2 small areas of skin breakdown on either side of the right foot from the cast rubbing. She states she did not feel the wounds developing. She currently denies signs of infection. 11/28; patient presents for 1 week follow-up. She has no issues or complaints today. She denies pain or acute signs of infection. 12/12; patient presents for follow-up. She reports improvement to her right lateral foot wound. She has been using silver alginate to the area. She has no issues or complaints today. 12/19; patient presents for follow-up. She reports that her right lateral foot wound has healed. She has no issues or complaints today. 1/3; patient presents for follow-up. She has no issues or complaints today. She reports no open wounds. Readmission 10/01/2021 Ms. Fauna Neuner is a 67 year old female's with a past medical history of insulin-dependent controlled type 2 diabetes complicated by peripheral neuropathy that presents to the clinic for a 18-monthhistory of nonhealing ulcer to the right foot. I have seen her before for a wound to the same area that was treated and healed. Her wound today however this is much deeper and it has thick yellow drainage. She is not sure exactly how it started. She noticed it 1 day. She has been following with Dr. EAmalia Haileyfor this issue. She had a wound culture that showed a mix of organisms on 09/01/2021. She was recently started on doxycycline. She is using the  dConservation officer, nature She has been using Iodosorb with dressing changes. She currently denies systemic signs of infection. 8/21; patient presents for follow-up. She states she started and completed levofloxacin. She is still taking doxycycline. She reports improvement in drainage and odor. She has been doing Dakin's wet-to-dry dressings as well. She is using her front offloading shoe. She denies signs of infection. 8/29; patient presents for follow-up. She is still taking doxycycline. She continues to report improvement in drainage and  reports no odor. She denies any purulent drainage. She has been doing silver alginate to the wound bed and using her front offloading shoe. 9/5; patient presents for follow-up. She completed the course of levofloxacin. She has someone that is able to drive her today so we can place the total contact cast. She reports no signs of infection. We have been doing silver alginate to the wound bed. 9/7; patient presents for follow-up. She has tolerated the total contact cast well. She presents for her obligatory cast change. She has no issues or complaints today. 9/11; patient presents for follow-up. She tolerated the total contact cast well. She has no issues or complaints today. We discussed potentially doing a skin substitute and patient would like to see if her insurance will cover this. 9/18; patient presents for follow-up. She had no issues with the total contact cast. She been approved for Grafix and patient would like to have this placed today. 9/25; patient presents for follow-up. Grafix #1 was placed in standard fashion at last clinic visit. She has no issues or complaints today. She tolerated the cast well. 10/2; patient presents for follow-up. Grafix #2 was placed in standard fashion at last clinic visit. Unfortunately she developed a wound to the lateral aspect of the foot over the past week. She states she has been on her feet a lot more and walking more. She denies  systemic signs of infection. 10/9; patient presents for follow-up. Grafix #3 was placed in standard at last clinic visit. The plantar foot wound has healed with this. She developed a new wound to the lateral aspect last week and this has gotten larger. She reports soreness to this area. She denies systemic signs of infection. 10/16; patient presents for follow-up. Her plantar wound continues to remain closed. Her lateral wound has improved with the use of Hydrofera Blue. She has no issues or complaints today. She denies signs of infection. She has almost completed her course of antibiotics prescribed at last clinic visit. 10/23; patient presents for follow-up. She has been using Hydrofera Blue and Medihoney to the wound bed. She has no issues or complaints today. She has been using her surgical shoe. 10/30; patient presents for follow-up. We have been using Hydrofera Blue and Medihoney to the wound bed. Patient has no issues or complaints today. 11/10; patient presents for follow-up. She has been using Hydrofera Blue and Medihoney without issues. She has been wearing her Orthotics. It is unclear if she is offloading this area or not. 11/17; patient presents for follow-up. She has been using Hydrofera Blue and Medihoney to the foot wound without issues. Grafix was available today for placement and patient would like to proceed with this. ANTHONELLA, KLAUSNER ([id number]) [id number] Page 4 of 11 12/4; patient presents for follow-up. Grafix was placed at last clinic visit. Her wound is healed. She has no issues or complaints today. She is reported no drainage from the previous wound site. 12/8; patient states that her podiatrist removed a callus on the previous wound site to the lateral right foot and there is a wound present. She currently denies signs of infection. 12/15; patient presents for follow-up. We have been using Grafix to the wound bed. This was placed in standard  fashion today. 12/21; patient presents for follow-up. We have been using Grafix to the wound bed. She completed her oral antibiotics. She has no issues or complaints today. Electronic Signature(s) Signed: 02/10/2022 2:00:17 PM By: Kalman Shan DO Entered By: Kalman Shan on 02/10/2022 13:49:52 --------------------------------------------------------------------------------  Physical Exam Details Patient Name: Date of Service: MA Reita May [name] 02/10/2022 12:30 PM Medical Record Number: [id number] Patient Account Number: [id number] Date of Birth/Sex: Treating RN: [birth year] (67 y.o. F) Primary Care Provider: [name] Other Clinician: Referring Provider: Treating Provider/Extender: [name] in Treatment: 18 Constitutional respirations regular, non-labored and within target range for patient.. Cardiovascular 2+ dorsalis pedis/posterior tibialis pulses. Psychiatric pleasant and cooperative. Notes T the lateral aspect of the right foot there is a small open wound with granulation tissue. No signs of infection. Good edema control. o Electronic Signature(s) Signed: 02/10/2022 2:00:17 PM By: Kalman Shan DO Entered By: Kalman Shan on 02/10/2022 13:50:27 -------------------------------------------------------------------------------- Physician Orders Details Patient Name: Date of Service: [name], [name] 02/10/2022 12:30 PM Medical Record Number: [id number] Patient Account Number: [id number] Date of Birth/Sex: Treating RN: [birth year] (67 y.o. Tonita [name], [name] Primary Care Provider: [name] Other Clinician: Referring Provider: Treating Provider/Extender: [name] in Treatment: 92 Verbal / Phone Orders: No Diagnosis Coding Follow-up Appointments ppointment in 1 week. - w/ Dr. Heber [name] (already has an appt.) Return A Anesthetic (In clinic) Topical Lidocaine 4%  applied to wound bed Cellular or Tissue Based Products Cellular or Tissue Based Product Type: - Run IVR for grafix TIMARA, LOMA ([id number]) [phone].pdf Page 5 of 11 12/30/21 order Grafix 01/07/2022 Grafix #4 applied 01/28/2022 Grafix #5 applied 02/03/22 Grafix # 6 applied 02/10/22 Grafix # 7 applied Cellular or Tissue Based Product applied to wound bed, secured with steri-strips, cover with Adaptic or Mepitel. (DO NOT REMOVE). Bathing/ Shower/ Hygiene May shower with protection but do not get wound dressing(s) wet. Edema Control - Lymphedema / SCD / Other Elevate legs to the level of the heart or above for 30 minutes daily and/or when sitting, a frequency of: Avoid standing for long periods of time. Wound Treatment Wound #5R - Foot Wound Laterality: Right, Lateral Cleanser: Soap and Water 1 x Per Week/7 Days Discharge Instructions: May shower and wash wound with dial antibacterial soap and water prior to dressing change. Peri-Wound Care: Skin Prep 1 x Per Week/7 Days Discharge Instructions: Use skin prep as directed Prim Dressing: Grafix PL Prime 1 x Per Week/7 Days ary Discharge Instructions: provider applies directly to wound bed. leave in place. Prim Dressing: adaptic and steri-strips 1 x Per Week/7 Days ary Discharge Instructions: covered over the Graifx. Leave in place. Secondary Dressing: Optifoam Non-Adhesive Dressing, 4x4 in 1 x Per Week/7 Days Discharge Instructions: Apply over primary dressing as directed. Secondary Dressing: Woven Gauze Sponge, Non-Sterile 4x4 in 1 x Per Week/7 Days Discharge Instructions: Apply over primary dressing as directed. Secured With: 83M Medipore H Soft Cloth Surgical T ape, 4 x 10 (in/yd) 1 x Per Week/7 Days Discharge Instructions: Secure with tape as directed. Electronic Signature(s) Signed: 02/10/2022 2:00:17 PM By: Kalman Shan DO Entered By: Kalman Shan on 02/10/2022  13:50:40 -------------------------------------------------------------------------------- Problem List Details Patient Name: Date of Service: [name], [name] 02/10/2022 12:30 PM Medical Record Number: [id number] Patient Account Number: [id number] Date of Birth/Sex: Treating RN: 1955/11/28 (67 y.o. F) Primary Care Provider: [name] Other Clinician: Referring Provider: Treating Provider/Extender: [name] in Treatment: 18 Active Problems ICD-10 Encounter Code Description Active Date MDM Diagnosis L97.512 Non-pressure chronic ulcer of other part of right foot with fat layer exposed 10/01/2021 No Yes E11.621 Type 2 diabetes mellitus with foot ulcer 10/01/2021 No Yes E11.42 Type  2 diabetes mellitus with diabetic polyneuropathy 10/01/2021 No Yes LAELYN, [name] ([id number]) [id number] Page 6 of 11 Inactive Problems Resolved Problems Electronic Signature(s) Signed: 02/10/2022 2:00:17 PM By: Kalman Shan DO Entered By: Kalman Shan on 02/10/2022 13:44:53 -------------------------------------------------------------------------------- Progress Note Details Patient Name: Date of Service: [name], [name] 02/10/2022 12:30 PM Medical Record Number: [id number] Patient Account Number: [id number] Date of Birth/Sex: Treating RN: Jul 13, 1955 (67 y.o. F) Primary Care Provider: [name] Other Clinician: Referring Provider: Treating Provider/Extender: [name] in Treatment: 18 Subjective Chief Complaint Information obtained from Patient 10/27; Right Plantar foot wound 11/21; 2 small areas of skin breakdown to the right foot following cast placement 8/29; right plantar foot wound History of Present Illness (HPI) Admission 12/17/2020 [name] a 67 year old female with a past medical history of insulin-dependent type 2 diabetes, hypothyroidism  and daily1 pack per day cigarette smoker the presents to the clinic for a 6-week history of nonhealing wound to the right first MTPJ. She has been following with [name], podiatry for this issue. She has been using silver alginate with dressing changes. She uses a postsurgical shoe and offloading pads. She currently denies signs of infection. 10/31; patient presents for follow-up. She tolerated the soft cast fine although she states that she felt her foot rolling to one side. She denies signs of infection. She would like to do the total contact cast today. 12/23/2020 upon evaluation today patient appears to be doing excellent in regard to her wound on the foot and she is in a total contact cast. I do think this is appropriate this is the first cast change which we are obliged to do to ensure nothing is rubbing everything appears to be doing quite well and very pleased in that regard. 11/7; patient presents for follow-up. She had no issues with the cast. She denies signs of infection. 11/14; patient presents for 1 week follow-up. She has had no issues with the cast. She denies signs of infection. 11/21; patient presents for 1 week follow-up. She did develop 2 small areas of skin breakdown on either side of the right foot from the cast rubbing. She states she did not feel the wounds developing. She currently denies signs of infection. 11/28; patient presents for 1 week follow-up. She has no issues or complaints today. She denies pain or acute signs of infection. 12/12; patient presents for follow-up. She reports improvement to her right lateral foot wound. She has been using silver alginate to the area. She has no issues or complaints today. 12/19; patient presents for follow-up. She reports that her right lateral foot wound has healed. She has no issues or complaints today. 1/3; patient presents for follow-up. She has no issues or complaints today. She reports no open wounds. Readmission  10/01/2021 [name] a 67 year old female's with a past medical history of insulin-dependent controlled type 2 diabetes complicated by peripheral neuropathy that presents to the clinic for a 45-monthhistory of nonhealing ulcer to the right foot. I have seen her before for a wound to the same area that was treated and healed. Her wound today however this is much deeper and it has thick yellow drainage. She is not sure exactly how it started. She noticed it 1 day. She has been following with Dr. EAmalia Haileyfor this issue. She had a wound culture that showed a mix of organisms on 09/01/2021. She was recently started on doxycycline. She is using the dConservation officer, nature  She has been using Iodosorb with dressing changes. She currently denies systemic signs of infection. 8/21; patient presents for follow-up. She states she started and completed levofloxacin. She is still taking doxycycline. She reports improvement in drainage and odor. She has been doing Dakin's wet-to-dry dressings as well. She is using her front offloading shoe. She denies signs of infection. 8/29; patient presents for follow-up. She is still taking doxycycline. She continues to report improvement in drainage and reports no odor. She denies any purulent drainage. She has been doing silver alginate to the wound bed and using her front offloading shoe. 9/5; patient presents for follow-up. She completed the course of levofloxacin. She has someone that is able to drive her today so we can place the total contact cast. She reports no signs of infection. We have been doing silver alginate to the wound bed. 9/7; patient presents for follow-up. She has tolerated the total contact cast well. She presents for her obligatory cast change. She has no issues or complaints today. 9/11; patient presents for follow-up. She tolerated the total contact cast well. She has no issues or complaints today. We discussed potentially doing a skin substitute and  patient would like to see if her insurance will cover this. [name], [name] ([id number]) [id number] Page 7 of 11 9/18; patient presents for follow-up. She had no issues with the total contact cast. She been approved for Grafix and patient would like to have this placed today. 9/25; patient presents for follow-up. Grafix #1 was placed in standard fashion at last clinic visit. She has no issues or complaints today. She tolerated the cast well. 10/2; patient presents for follow-up. Grafix #2 was placed in standard fashion at last clinic visit. Unfortunately she developed a wound to the lateral aspect of the foot over the past week. She states she has been on her feet a lot more and walking more. She denies systemic signs of infection. 10/9; patient presents for follow-up. Grafix #3 was placed in standard at last clinic visit. The plantar foot wound has healed with this. She developed a new wound to the lateral aspect last week and this has gotten larger. She reports soreness to this area. She denies systemic signs of infection. 10/16; patient presents for follow-up. Her plantar wound continues to remain closed. Her lateral wound has improved with the use of Hydrofera Blue. She has no issues or complaints today. She denies signs of infection. She has almost completed her course of antibiotics prescribed at last clinic visit. 10/23; patient presents for follow-up. She has been using Hydrofera Blue and Medihoney to the wound bed. She has no issues or complaints today. She has been using her surgical shoe. 10/30; patient presents for follow-up. We have been using Hydrofera Blue and Medihoney to the wound bed. Patient has no issues or complaints today. 11/10; patient presents for follow-up. She has been using Hydrofera Blue and Medihoney without issues. She has been wearing her Orthotics. It is unclear if she is offloading this area or not. 11/17; patient presents for  follow-up. She has been using Hydrofera Blue and Medihoney to the foot wound without issues. Grafix was available today for placement and patient would like to proceed with this. 12/4; patient presents for follow-up. Grafix was placed at last clinic visit. Her wound is healed. She has no issues or complaints today. She is reported no drainage from the previous wound site. 12/8; patient states that her podiatrist removed a callus on the previous wound site to the lateral  right foot and there is a wound present. She currently denies signs of infection. 12/15; patient presents for follow-up. We have been using Grafix to the wound bed. This was placed in standard fashion today. 12/21; patient presents for follow-up. We have been using Grafix to the wound bed. She completed her oral antibiotics. She has no issues or complaints today. Patient History Information obtained from Patient, Chart. Family History Unknown History. Social History Current every day smoker - 1 pack/a/day, Alcohol Use - Never, Drug Use - No History, Caffeine Use - Moderate. Medical History Eyes Denies history of Cataracts, Glaucoma, Optic Neuritis Ear/Nose/Mouth/Throat Denies history of Chronic sinus problems/congestion, Middle ear problems Hematologic/Lymphatic Denies history of Anemia, Hemophilia, Human Immunodeficiency Virus, Lymphedema, Sickle Cell Disease Respiratory Patient has history of Sleep Apnea Denies history of Aspiration, Asthma, Chronic Obstructive Pulmonary Disease (COPD), Pneumothorax, Tuberculosis Cardiovascular Patient has history of Coronary Artery Disease, Hypertension Denies history of Angina, Arrhythmia, Congestive Heart Failure, Deep Vein Thrombosis, Hypotension, Myocardial Infarction, Peripheral Arterial Disease, Peripheral Venous Disease, Phlebitis, Vasculitis Gastrointestinal Denies history of Cirrhosis , Colitis, Crohnoos, Hepatitis A, Hepatitis B, Hepatitis C Endocrine Patient has history  of Type II Diabetes Denies history of Type I Diabetes Genitourinary Denies history of End Stage Renal Disease Immunological Denies history of Lupus Erythematosus, Raynaudoos, Scleroderma Integumentary (Skin) Denies history of History of Burn Musculoskeletal Denies history of Gout, Rheumatoid Arthritis, Osteoarthritis, Osteomyelitis Neurologic Patient has history of Neuropathy Denies history of Dementia, Quadriplegia, Paraplegia, Seizure Disorder Hospitalization/Surgery History - 2nd and 3rd right toe amps. - total hysterectomy. - cholecystectomy. - 2 knee surgery's on left and 1 on right. Medical A Surgical History Notes nd Cardiovascular hypercholesterolemia MARLIN, BRYS ([id number]) [id number] Page 8 of 11 Objective Constitutional respirations regular, non-labored and within target range for patient.. Vitals Time Taken: 12:48 PM, Temperature: 97.6 F, Pulse: 86 bpm, Respiratory Rate: 18 breaths/min, Blood Pressure: 105/70 mmHg, Capillary Blood Glucose: 108 mg/dl. Cardiovascular 2+ dorsalis pedis/posterior tibialis pulses. Psychiatric pleasant and cooperative. General Notes: T the lateral aspect of the right foot there is a small open wound with granulation tissue. No signs of infection. Good edema control. o Integumentary (Hair, Skin) Wound #5R status is Open. Original cause of wound was Blister. The date acquired was: 11/22/2021. The wound has been in treatment 11 weeks. The wound is located on the Right,Lateral Foot. The wound measures 0.1cm length x 0.1cm width x 0.1cm depth; 0.008cm^2 area and 0.001cm^3 volume. There is no tunneling or undermining noted. There is a none present amount of drainage noted. The wound margin is distinct with the outline attached to the wound base. There is large (67-100%) red, pink granulation within the wound bed. There is no necrotic tissue within the wound bed. The periwound skin appearance had  no abnormalities noted for color. The periwound skin appearance exhibited: Callus, Dry/Scaly. The periwound skin appearance did not exhibit: Crepitus, Excoriation, Induration, Rash, Scarring, Maceration. Periwound temperature was noted as No Abnormality. The periwound has tenderness on palpation. Assessment Active Problems ICD-10 Non-pressure chronic ulcer of other part of right foot with fat layer exposed Type 2 diabetes mellitus with foot ulcer Type 2 diabetes mellitus with diabetic polyneuropathy Patient's wound is stable. I debrided nonviable tissue. No signs of infection. Grafix was placed in standard fashion. At this time I recommended the total contact cast. We will set this up for next clinic visit. Procedures Wound #5R Pre-procedure diagnosis of Wound #5R is a Diabetic Wound/Ulcer of the Lower Extremity located on the Right,Lateral Foot .Severity of  Tissue Pre Debridement is: Fat layer exposed. There was a Selective/Open Wound Skin/Epidermis Debridement with a total area of 0.01 sq cm performed by Kalman Shan, DO. With the following instrument(s): Curette to remove Viable and Non-Viable tissue/material. Material removed includes Callus, Skin: Dermis, and Skin: Epidermis after achieving pain control using Lidocaine. No specimens were taken. A time out was conducted at 13:10, prior to the start of the procedure. A Minimum amount of bleeding was controlled with Pressure. The procedure was tolerated well with a pain level of 0 throughout and a pain level of 0 following the procedure. Post Debridement Measurements: 0.2cm length x 0.2cm width x 0.2cm depth; 0.006cm^3 volume. Character of Wound/Ulcer Post Debridement is improved. Severity of Tissue Post Debridement is: Fat layer exposed. Post procedure Diagnosis Wound #5R: Same as Pre-Procedure Pre-procedure diagnosis of Wound #5R is a Diabetic Wound/Ulcer of the Lower Extremity located on the Right,Lateral Foot. A skin graft procedure  using a bioengineered skin substitute/cellular or tissue based product was performed by Kalman Shan, DO with the following instrument(s): Forceps and Scissors. Grafix prime was applied and secured with Steri-Strips. 2 sq cm of product was utilized and 0 sq cm was wasted. Post Application, adaptic. gauze was applied. A Time Out was conducted at 13:20, prior to the start of the procedure. The procedure was tolerated well with a pain level of 0 throughout and a pain level of 0 following the procedure. Post procedure Diagnosis Wound #5R: Same as Pre-Procedure . Plan Follow-up Appointments: Return Appointment in 1 week. - w/ Dr. Heber Almena (already has an appt.) Anesthetic: (In clinic) Topical Lidocaine 4% applied to wound bed Cellular or Tissue Based Products: Cellular or Tissue Based Product Type: - Run IVR for grafix 12/30/21 order Grafix 01/07/2022 Grafix #4 applied 01/28/2022 Grafix #5 applied 02/03/22 Grafix # 6 applied 02/10/22 Grafix # 7 applied Cellular or Tissue Based Product applied to wound bed, secured with steri-strips, cover with Adaptic or Mepitel. (DO NOT REMOVE). Bathing/ Shower/ Hygiene: May shower with protection but do not get wound dressing(s) wet. Edema Control - Lymphedema / SCD / OtherMANEH, [name] ([id number]) [id number] Page 9 of 11 Elevate legs to the level of the heart or above for 30 minutes daily and/or when sitting, a frequency of: Avoid standing for long periods of time. WOUND #5R: - Foot Wound Laterality: Right, Lateral Cleanser: Soap and Water 1 x Per Week/7 Days Discharge Instructions: May shower and wash wound with dial antibacterial soap and water prior to dressing change. Peri-Wound Care: Skin Prep 1 x Per Week/7 Days Discharge Instructions: Use skin prep as directed Prim Dressing: Grafix PL Prime 1 x Per Week/7 Days ary Discharge Instructions: provider applies directly to wound bed. leave in place. Prim Dressing:  adaptic and steri-strips 1 x Per Week/7 Days ary Discharge Instructions: covered over the Graifx. Leave in place. Secondary Dressing: Optifoam Non-Adhesive Dressing, 4x4 in 1 x Per Week/7 Days Discharge Instructions: Apply over primary dressing as directed. Secondary Dressing: Woven Gauze Sponge, Non-Sterile 4x4 in 1 x Per Week/7 Days Discharge Instructions: Apply over primary dressing as directed. Secured With: 66M Medipore H Soft Cloth Surgical T ape, 4 x 10 (in/yd) 1 x Per Week/7 Days Discharge Instructions: Secure with tape as directed. 1. In office sharp debridement 2. Grafix placed in standard fashion 3. Follow-up in 1 weekoopotential TCC placement Electronic Signature(s) Signed: 02/10/2022 2:00:17 PM By: Kalman Shan DO Entered By: Kalman Shan on 02/10/2022 13:51:27 -------------------------------------------------------------------------------- HxROS Details Patient Name: Date of Service: MA Earnest Bailey  RD, [name] 02/10/2022 12:30 PM Medical Record Number: [id number] Patient Account Number: [id number] Date of Birth/Sex: Treating RN: 1955/12/13 (67 y.o. F) Primary Care Provider: [name] Other Clinician: Referring Provider: Treating Provider/Extender: [name] in Treatment: 18 Information Obtained From Patient Chart Eyes Medical History: Negative for: Cataracts; Glaucoma; Optic Neuritis Ear/Nose/Mouth/Throat Medical History: Negative for: Chronic sinus problems/congestion; Middle ear problems Hematologic/Lymphatic Medical History: Negative for: Anemia; Hemophilia; Human Immunodeficiency Virus; Lymphedema; Sickle Cell Disease Respiratory Medical History: Positive for: Sleep Apnea Negative for: Aspiration; Asthma; Chronic Obstructive Pulmonary Disease (COPD); Pneumothorax; Tuberculosis Cardiovascular Medical History: Positive for: Coronary Artery Disease; Hypertension Negative for: Angina; Arrhythmia; Congestive Heart  Failure; Deep Vein Thrombosis; Hypotension; Myocardial Infarction; Peripheral Arterial Disease; Peripheral Venous Disease; Phlebitis; Vasculitis Past Medical History Notes: hypercholesterolemia Gastrointestinal Tambra, Muller [name] ([id number]) [id number] Page 10 of 11 Medical History: Negative for: Cirrhosis ; Colitis; Crohns; Hepatitis A; Hepatitis B; Hepatitis C Endocrine Medical History: Positive for: Type II Diabetes Negative for: Type I Diabetes Genitourinary Medical History: Negative for: End Stage Renal Disease Immunological Medical History: Negative for: Lupus Erythematosus; Raynauds; Scleroderma Integumentary (Skin) Medical History: Negative for: History of Burn Musculoskeletal Medical History: Negative for: Gout; Rheumatoid Arthritis; Osteoarthritis; Osteomyelitis Neurologic Medical History: Positive for: Neuropathy Negative for: Dementia; Quadriplegia; Paraplegia; Seizure Disorder Immunizations Pneumococcal Vaccine: Received Pneumococcal Vaccination: Yes Received Pneumococcal Vaccination On or After 60th Birthday: Yes Tetanus Vaccine: Last tetanus shot: 12/17/2020 Implantable Devices No devices added Hospitalization / Surgery History Type of Hospitalization/Surgery 2nd and 3rd right toe amps total hysterectomy cholecystectomy 2 knee surgery's on left and 1 on right Family and Social History Unknown History: Yes; Current every day smoker - 1 pack/a/day; Alcohol Use: Never; Drug Use: No History; Caffeine Use: Moderate; Financial Concerns: No; Food, Clothing or Shelter Needs: No; Support System Lacking: No; Transportation Concerns: No Electronic Signature(s) Signed: 02/10/2022 2:00:17 PM By: Kalman Shan DO Entered By: Kalman Shan on 02/10/2022 13:49:59 -------------------------------------------------------------------------------- SuperBill Details Patient Name: Date of Service: [name], [name]  02/10/2022 Medical Record Number: [id number] Patient Account Number: [id number] Date of Birth/Sex: Treating RN: [birth year] (67 y.o. Tonita [name], [name] Primary Care Provider: [name] Other Clinician: Harold Hedge ([id number]) [id number] Page 11 of 11 Referring Provider: Treating Provider/Extender: [name] in Treatment: 18 Diagnosis Coding ICD-10 Codes Code Description [phone] Non-pressure chronic ulcer of other part of right foot with fat layer exposed E11.621 Type 2 diabetes mellitus with foot ulcer E11.42 Type 2 diabetes mellitus with diabetic polyneuropathy Facility Procedures : CPT4 Code: 27078675 Description: Q4133- Grafix PL 16 mm disc (2 units) Modifier: Quantity: 2 : CPT4 Code: 44920100 Description: 71219 - SKIN SUB GRAFT FACE/NK/HF/G ICD-10 Diagnosis Description L97.512 Non-pressure chronic ulcer of other part of right foot with fat layer exposed Modifier: Quantity: 1 Physician Procedures : CPT4 Code Description Modifier [id number] - WC PHYS SKIN SUB GRAFT FACE/NK/HF/G ICD-10 Diagnosis Description L97.512 Non-pressure chronic ulcer of other part of right foot with fat layer exposed Quantity: 1 Electronic Signature(s) Signed: 02/10/2022 2:00:17 PM By: Kalman Shan DO Entered By: Kalman Shan on 02/10/2022 13:51:42

## 2022-02-24 NOTE — Progress Notes (Signed)
CALIANNA, KIM ([id number]) [id number] Page 1 of 6 Visit Report for 02/10/2022 Arrival Information Details Patient Name: Date of Service: [name], [name] [name] 02/10/2022 12:30 PM Medical Record Number: [id number] Patient Account Number: [id number] Date of Birth/Sex: Treating RN: 1956-02-21 (67 y.o. F) Primary Care [name]: [name] Other Clinician: Referring [name]: Treating Jadi Deyarmin/Extender: [name] in Treatment: 18 Visit Information History Since Last Visit Added or deleted any medications: No Patient Arrived: Ambulatory Any new allergies or adverse reactions: No Arrival Time: 12:47 Had [name] fall or experienced change in No Accompanied By: self activities of daily living that [name] affect Transfer Assistance: None risk of falls: Patient Identification Verified: Yes Signs or symptoms of abuse/neglect since last visito No Secondary Verification Process Completed: Yes Hospitalized since last visit: No Patient Requires Transmission-Based Precautions: No Implantable device outside of the clinic excluding No Patient Has Alerts: No cellular tissue based products placed in the center since last visit: Has Dressing in Place as Prescribed: Yes Pain Present Now: No Electronic Signature(s) Signed: 02/11/2022 8:46:25 AM By: Erenest Blank Entered By: Erenest Blank on 02/10/2022 12:47:46 -------------------------------------------------------------------------------- Lower Extremity Assessment Details Patient Name: Date of Service: [name], [name] [name] 02/10/2022 12:30 PM Medical Record Number: [id number] Patient Account Number: [id number] Date of Birth/Sex: Treating RN: Oct 30, 1955 (67 y.o. F) Primary Care Tommy Goostree: [name] Other Clinician: Referring [name]: Treating Elnita Surprenant/Extender: [name] in Treatment: 18 Edema Assessment Assessed: [name]: No] [Right:  No] Edema: [Left: Ye] [Right: s] Calf Left: Right: Point of Measurement: From Medial Instep 44.5 cm Ankle Left: Right: Point of Measurement: From Medial Instep 23.5 cm Electronic Signature(s) Signed: 02/11/2022 8:46:25 AM By: Erenest Blank Entered By: Erenest Blank on 02/10/2022 12:53:42 [name], [name] ([id number]) [id number] Page 2 of 6 -------------------------------------------------------------------------------- Multi Wound Chart Details Patient Name: Date of Service: [name] [name] [name] 02/10/2022 12:30 PM Medical Record Number: [id number] Patient Account Number: [id number] Date of Birth/Sex: Treating RN: [birth year] (67 y.o. F) Primary Care Aveon Colquhoun: [name] Other Clinician: Referring [name]: Treating [name]/Extender: [name] in Treatment: 18 Vital Signs Height(in): Capillary Blood Glucose(mg/dl): 108 Weight(lbs): Pulse(bpm): 86 Body Mass Index(BMI): Blood Pressure(mmHg): 105/70 Temperature(F): 97.6 Respiratory Rate(breaths/min): 18 [5R:Photos:] [N/[name]:N/[name]] Right, Lateral Foot N/[name] N/[name] Wound Location: Blister N/[name] N/[name] Wounding Event: Diabetic Wound/Ulcer of the Lower N/[name] N/[name] Primary Etiology: Extremity Sleep Apnea, Coronary Artery N/[name] N/[name] Comorbid History: Disease, Hypertension, Type II Diabetes, Neuropathy 11/22/2021 N/[name] N/[name] Date Acquired: 11 N/[name] N/[name] Weeks of Treatment: Open N/[name] N/[name] Wound Status: Yes N/[name] N/[name] Wound Recurrence: 0.1x0.1x0.1 N/[name] N/[name] Measurements L x W x D (cm) 0.008 N/[name] N/[name] [name] (cm) : rea 0.001 N/[name] N/[name] Volume (cm) : 99.50% N/[name] N/[name] % Reduction in [name] rea: 99.80% N/[name] N/[name] % Reduction in Volume: Grade 2 N/[name] N/[name] Classification: None Present N/[name] N/[name] Exudate [name] mount: Distinct, outline attached N/[name] N/[name] Wound Margin: Large (67-100%) N/[name] N/[name] Granulation [name] mount: Red, Pink N/[name] N/[name] Granulation Quality: None Present (0%) N/[name] N/[name] Necrotic [name] mount: Fascia:  No N/[name] N/[name] Exposed Structures: Fat Layer (Subcutaneous Tissue): No Tendon: No Muscle: No Joint: No Bone: No Large (67-100%) N/[name] N/[name] Epithelialization: Debridement - Selective/Open Wound N/[name] N/[name] Debridement: Pre-procedure Verification/Time Out 13:10 N/[name] N/[name] Taken: Lidocaine N/[name] N/[name] Pain Control: Callus N/[name] N/[name] Tissue Debrided: Skin/Epidermis N/[name] N/[name] Level: 0.01 N/[name] N/[name] Debridement [name] (sq cm): rea Curette N/[name] N/[name] Instrument: Minimum N/[name] N/[name] Bleeding: Pressure N/[name] N/[name] Hemostasis  [name] chieved: 0 N/[name] N/[name] Procedural Pain: 0 N/[name] N/[name] Post Procedural Pain: Procedure was tolerated well N/[name] N/[name] Debridement Treatment Response: 0.2x0.2x0.2 N/[name] N/[name] Post Debridement Measurements L x W x D (cm) 0.006 N/[name] N/[name] Post Debridement Volume: (cm) Callus: Yes N/[name] N/[name] Periwound Skin Texture: Excoriation: No [name], [name] ([id number]) [phone].pdf Page 3 of 6 Induration: No Crepitus: No Rash: No Scarring: No Dry/Scaly: Yes N/[name] N/[name] Periwound Skin Moisture: Maceration: No Atrophie Blanche: No N/[name] N/[name] Periwound Skin Color: Cyanosis: No Ecchymosis: No Erythema: No Hemosiderin Staining: No Mottled: No Pallor: No Rubor: No No Abnormality N/[name] N/[name] Temperature: Yes N/[name] N/[name] Tenderness on Palpation: Cellular or Tissue Based Product N/[name] N/[name] Procedures Performed: Debridement Treatment Notes Electronic Signature(s) Signed: 02/10/2022 2:00:17 PM By: Kalman Shan DO Entered By: Kalman Shan on 02/10/2022 13:44:58 -------------------------------------------------------------------------------- Multi-Disciplinary Care Plan Details Patient Name: Date of Service: [name], [name] [name] 02/10/2022 12:30 PM Medical Record Number: [id number] Patient Account Number: [id number] Date of Birth/Sex: Treating RN: 12/13/1955 (67 y.o. [name], [name] Primary Care Karime Scheuermann: [name] Other Clinician: Referring Paula Busenbark: Treating Joselyne Spake/Extender:  [name] in Treatment: 18 Active Inactive Electronic Signature(s) Signed: 02/23/2022 5:35:40 PM By: Rhae Hammock RN Entered By: Rhae Hammock on 02/10/2022 13:08:35 -------------------------------------------------------------------------------- Pain Assessment Details Patient Name: Date of Service: [name], [name] [name] 02/10/2022 12:30 PM Medical Record Number: [id number] Patient Account Number: [id number] Date of Birth/Sex: Treating RN: 1955/10/21 (67 y.o. F) Primary Care Sherlie Boyum: [name] Other Clinician: Referring Shewanda Sharpe: Treating Briseidy Spark/Extender: [name] in Treatment: 18 Active Problems Location of Pain Severity and Description of Pain Patient Has Paino No Site Locations [place], Dali [name] ([id number]) [id number] Page 4 of 6 Pain Management and Medication Current Pain Management: Electronic Signature(s) Signed: 02/11/2022 8:46:25 AM By: Erenest Blank Entered By: Erenest Blank on 02/10/2022 12:50:40 -------------------------------------------------------------------------------- Patient/Caregiver Education Details Patient Name: Date of Service: [name], [name] [name] Raliegh Ip 12/21/2023andnbsp12:30 PM Medical Record Number: [id number] Patient Account Number: [id number] Date of Birth/Gender: Treating RN: 1955/08/22 (67 y.o. [name], [name] Primary Care Physician: [name] Other Clinician: Referring Physician: Treating Physician/Extender: [name] in Treatment: 18 Education Assessment Education Provided To: Patient Education Topics Provided Wound/Skin Impairment: Methods: Explain/Verbal Responses: Reinforcements needed, State content correctly [employer]) Signed: 02/23/2022 5:35:40 PM By: Rhae Hammock RN Entered By: Rhae Hammock on 02/10/2022  13:08:50 -------------------------------------------------------------------------------- Wound Assessment Details Patient Name: Date of Service: [name], [name] [name] 02/10/2022 12:30 PM Medical Record Number: [id number] Patient Account Number: [id number] Date of Birth/Sex: Treating RN: [birth year] (67 y.o. F) Primary Care Suly Vukelich: [name] Other Clinician: Referring Emmalin Jaquess: Treating Luretta Everly/Extender: Henrine Screws, Aliese [name] ([id number]) [phone].pdf Page 5 of 6 Weeks in Treatment: 18 Wound Status Wound Number: 5R Primary Diabetic Wound/Ulcer of the Lower Extremity Etiology: Wound Location: Right, Lateral Foot Wound Open Wounding Event: Blister Status: Date Acquired: 11/22/2021 Comorbid Sleep Apnea, Coronary Artery Disease, Hypertension, Type II Weeks Of Treatment: 11 History: Diabetes, Neuropathy Clustered Wound: No Photos Wound Measurements Length: (cm) 0.1 Width: (cm) 0.1 Depth: (cm) 0.1 Area: (cm) 0.008 Volume: (cm) 0.001 % Reduction in Area: 99.5% % Reduction in Volume: 99.8% Epithelialization: Large (67-100%) Tunneling: No Undermining: No Wound Description Classification: Grade 2 Wound Margin: Distinct, outline attached Exudate Amount: None Present Foul Odor After Cleansing: No Slough/Fibrino No Wound Bed Granulation Amount: Large (67-100%) Exposed Structure Granulation Quality: Red, Pink Fascia Exposed: No Necrotic Amount:  None Present (0%) Fat Layer (Subcutaneous Tissue) Exposed: No Tendon Exposed: No Muscle Exposed: No Joint Exposed: No Bone Exposed: No Periwound Skin Texture Texture Color No Abnormalities Noted: No No Abnormalities Noted: Yes Callus: Yes Temperature / Pain Crepitus: No Temperature: No Abnormality Excoriation: No Tenderness on Palpation: Yes Induration: No Rash: No Scarring: No Moisture No Abnormalities Noted: No Dry / Scaly: Yes Maceration:  No Electronic Signature(s) Signed: 02/11/2022 8:46:25 AM By: Erenest Blank Entered By: Erenest Blank on 02/10/2022 12:56:00 Vitals Details -------------------------------------------------------------------------------- [name] ([id number]) [id number] Page 6 of 6 Patient Name: Date of Service: [name], [name] [name] 02/10/2022 12:30 PM Medical Record Number: [id number] Patient Account Number: [id number] Date of Birth/Sex: Treating RN: August 05, 1955 (67 y.o. F) Primary Care Latifah Padin: [name] Other Clinician: Referring Neven Fina: Treating Sharyah Bostwick/Extender: [name] in Treatment: 18 Vital Signs Time Taken: 12:48 Temperature (F): 97.6 Pulse (bpm): 86 Respiratory Rate (breaths/min): 18 Blood Pressure (mmHg): 105/70 Capillary Blood Glucose (mg/dl): 108 Reference Range: 80 - 120 mg / dl Electronic Signature(s) Signed: 02/11/2022 8:46:25 AM By: Erenest Blank Entered By: Erenest Blank on 02/10/2022 12:50:29

## 2022-02-25 NOTE — Progress Notes (Signed)
BLAKELEY, SCHEIER ([id number]) [id number] Page 1 of 8 Visit Report for 02/24/2022 Arrival Information Details Patient Name: Date of Service: [name], [name] 02/24/2022 3:00 PM Medical Record Number: [id number] Patient Account Number: [id number] Date of Birth/Sex: Treating RN: Dec 08, 1955 (67 y.o. [name], [name] Primary Care [name]: [name] Other Clinician: Referring [name]: Treating [name]/Extender: [name] in Treatment: 20 Visit Information History Since Last Visit Added or deleted any medications: No Patient Arrived: Ambulatory Any new allergies or adverse reactions: No Arrival Time: 15:03 Had [name] fall or experienced change in No Accompanied By: self activities of daily living that may affect Transfer Assistance: None risk of falls: Patient Identification Verified: Yes Signs or symptoms of abuse/neglect since last visito No Secondary Verification Process Completed: Yes Hospitalized since last visit: No Patient Requires Transmission-Based Precautions: No Implantable device outside of the clinic excluding No Patient Has Alerts: No cellular tissue based products placed in the center since last visit: Has Dressing in Place as Prescribed: Yes Has Footwear/Offloading in Place as Prescribed: Yes Right: T Contact Cast otal Pain Present Now: No Notes TCC got wet last night after [name] bath. Cast protector broke. Electronic Signature(s) Signed: 02/24/2022 6:11:27 PM By: [name] Pilling RN, BSN Entered By: [name] on 02/24/2022 15:12:07 -------------------------------------------------------------------------------- Encounter Discharge Information Details Patient Name: Date of Service: [name], [name] 02/24/2022 3:00 PM Medical Record Number: [id number] Patient Account Number: [id number] Date of Birth/Sex: Treating RN: 03/20/1955 (67 y.o. [name], [name] Primary Care [name]: [name] Other Clinician: Referring [name]: Treating [name]/Extender: [name] in Treatment: 20 Encounter Discharge Information Items Post Procedure Vitals Discharge Condition: Stable Pulse (bpm): 91 Ambulatory Status: Ambulatory Respiratory Rate (breaths/min): 20 Discharge Destination: Home Blood Pressure (mmHg): 125/80 Transportation: Private Auto Unable to obtain vitals Reason: temperature unable to obtain. Accompanied By: self Schedule Follow-up Appointment: Yes Clinical Summary of Care: Electronic Signature(s) Signed: 02/24/2022 6:11:27 PM By: [name] Pilling RN, BSN Entered By: [name] on 02/24/2022 15:30:14 [name], [name] ([id number]) [id number] Page 2 of 8 -------------------------------------------------------------------------------- Lower Extremity Assessment Details Patient Name: Date of Service: [name], [name] 02/24/2022 3:00 PM Medical Record Number: [id number] Patient Account Number: [id number] Date of Birth/Sex: Treating RN: 10-29-1955 (67 y.o. [name], [name] Primary Care Aleyssa Pike: [name] Other Clinician: Referring Klair Leising: Treating [name]/Extender: [name] in Treatment: 20 Edema Assessment Assessed: [name]: No] [Right: Yes] Edema: [Left: Ye] [Right: s] Calf Left: Right: Point of Measurement: From Medial Instep 39 cm Ankle Left: Right: Point of Measurement: From Medial Instep 22 cm Electronic Signature(s) Signed: 02/24/2022 6:11:27 PM By: [name] Pilling RN, BSN Entered By: [name] on 02/24/2022 15:11:17 -------------------------------------------------------------------------------- Multi Wound Chart Details Patient Name: Date of Service: [name], [name] 02/24/2022 3:00 PM Medical Record Number: [id number] Patient Account Number: [id number] Date of Birth/Sex: Treating RN: 03-25-1955 (67 y.o. F) Primary Care Aftan Vint:  [name] Other Clinician: Referring Kelisha Dall: Treating [name]/Extender: [name] in Treatment: 20 Vital Signs Height(in): Capillary Blood Glucose(mg/dl): 116 Weight(lbs): Pulse(bpm): 91 Body Mass Index(BMI): Blood Pressure(mmHg): 125/80 Temperature(F): Respiratory Rate(breaths/min): 20 [5R:Photos:] [N/[name]:N/[name]] Right, Lateral Foot N/[name] N/[name] Wound Location: Blister N/[name] N/[name] Wounding Event: Diabetic Wound/Ulcer of the Lower N/[name] N/[name] Primary Etiology: Extremity Sleep Apnea, Coronary Artery N/[name] N/[name] Comorbid History: Disease, Hypertension, Type II [name], [name] ([id number]) [id number] Page 3 of 8 Diabetes, Neuropathy 11/22/2021 N/[name] N/[name] Date  Acquired: 13 N/[name] N/[name] Weeks of Treatment: Open N/[name] N/[name] Wound Status: Yes N/[name] N/[name] Wound Recurrence: 0.2x0.2x0.2 N/[name] N/[name] Measurements L x W x D (cm) 0.031 N/[name] N/[name] [name] (cm) : rea 0.006 N/[name] N/[name] Volume (cm) : 98.20% N/[name] N/[name] % Reduction in [name] rea: 98.90% N/[name] N/[name] % Reduction in Volume: Grade 2 N/[name] N/[name] Classification: Small N/[name] N/[name] Exudate [name] mount: Serosanguineous N/[name] N/[name] Exudate Type: red, brown N/[name] N/[name] Exudate Color: Distinct, outline attached N/[name] N/[name] Wound Margin: Large (67-100%) N/[name] N/[name] Granulation [name] mount: Red N/[name] N/[name] Granulation Quality: None Present (0%) N/[name] N/[name] Necrotic [name] mount: Fat Layer (Subcutaneous Tissue): Yes N/[name] N/[name] Exposed Structures: Fascia: No Tendon: No Muscle: No Joint: No Bone: No Large (67-100%) N/[name] N/[name] Epithelialization: Debridement - Excisional N/[name] N/[name] Debridement: Pre-procedure Verification/Time Out 15:20 N/[name] N/[name] Taken: Lidocaine 4% Topical Solution N/[name] N/[name] Pain Control: Callus, Subcutaneous, Slough N/[name] N/[name] Tissue Debrided: Skin/Subcutaneous Tissue N/[name] N/[name] Level: 0.16 N/[name] N/[name] Debridement [name] (sq cm): rea Curette N/[name] N/[name] Instrument: Minimum N/[name] N/[name] Bleeding: Pressure N/[name] N/[name] Hemostasis [name] chieved: 0 N/[name]  N/[name] Procedural Pain: 0 N/[name] N/[name] Post Procedural Pain: Procedure was tolerated well N/[name] N/[name] Debridement Treatment Response: 0.2x0.2x0.2 N/[name] N/[name] Post Debridement Measurements L x W x D (cm) 0.006 N/[name] N/[name] Post Debridement Volume: (cm) Callus: Yes N/[name] N/[name] Periwound Skin Texture: Excoriation: No Induration: No Crepitus: No Rash: No Scarring: No Maceration: Yes N/[name] N/[name] Periwound Skin Moisture: Dry/Scaly: No Atrophie Blanche: No N/[name] N/[name] Periwound Skin Color: Cyanosis: No Ecchymosis: No Erythema: No Hemosiderin Staining: No Mottled: No Pallor: No Rubor: No No Abnormality N/[name] N/[name] Temperature: Yes N/[name] N/[name] Tenderness on Palpation: Debridement N/[name] N/[name] Procedures Performed: Treatment Notes Wound #5R (Foot) Wound Laterality: Right, Lateral Cleanser Soap and Water Discharge Instruction: May shower and wash wound with dial antibacterial soap and water prior to dressing change. Peri-Wound Care Skin Prep Discharge Instruction: Use skin prep as directed Topical Primary Dressing Hydrofera Blue Ready Transfer Foam, 4x5 (in/in) Discharge Instruction: Apply to wound bed as instructed MediHoney Gel, tube 1.5 (oz) Discharge Instruction: Apply to wound bed as instructed Secondary Dressing Optifoam Non-Adhesive Dressing, 4x4 in [name], [name] ([id number]) [phone].pdf Page 4 of 8 Discharge Instruction: Apply over primary dressing as directed. Woven Gauze Sponge, Non-Sterile 4x4 in Discharge Instruction: Apply over primary dressing as directed. Secured With 38M Medipore H Soft Cloth Surgical T ape, 4 x 10 (in/yd) Discharge Instruction: Secure with tape as directed. Compression Wrap Compression Stockings Add-Ons Electronic Signature(s) Signed: 02/24/2022 3:33:48 PM By: Kalman Shan DO Entered By: Kalman Shan on 02/24/2022 15:30:24 -------------------------------------------------------------------------------- Multi-Disciplinary Care Plan  Details Patient Name: Date of Service: [name], [name] 02/24/2022 3:00 PM Medical Record Number: [id number] Patient Account Number: [id number] Date of Birth/Sex: Treating RN: 12-Sep-1955 (67 y.o. [name], [name] Primary Care Keyonna Comunale: [name] Other Clinician: Referring Cortana Vanderford: Treating Amelda Hapke/Extender: [name] in Treatment: 20 Active Inactive Nutrition Nursing Diagnoses: Potential for alteratiion in Nutrition/Potential for imbalanced nutrition Goals: Patient/caregiver agrees to and verbalizes understanding of need to obtain nutritional consultation Date Initiated: 02/24/2022 Target Resolution Date: 03/31/2022 Goal Status: Active Patient/caregiver will maintain therapeutic glucose control Date Initiated: 02/24/2022 Target Resolution Date: 04/01/2022 Goal Status: Active Interventions: Assess patient nutrition upon admission and as needed per policy Provide education on nutrition Notes: Electronic Signature(s) Signed: 02/24/2022 6:11:27 PM By: [name] Pilling RN, BSN Entered By: [name] on 02/24/2022 15:13:45 -------------------------------------------------------------------------------- Pain Assessment Details Patient Name: Date of Service: [name], [name]  [name]. 02/24/2022 3:00 PM Medical Record Number: [id number] Patient Account Number: [id number] [name], [name] ([id number]) [phone].pdf Page 5 of 8 Date of Birth/Sex: Treating RN: 24-Jan-1956 (67 y.o. [name] Primary Care Belvie Iribe: [name] Other Clinician: Referring Filimon Miranda: Treating Dessie Tatem/Extender: [name] in Treatment: 20 Active Problems Location of Pain Severity and Description of Pain Patient Has Paino No Site Locations Rate the pain. Current Pain Level: 0 Pain Management and Medication Current Pain Management: Medication: No Cold Application: No Rest: No Massage: No Activity:  No T.E.N.S.: No Heat Application: No Leg drop or elevation: No Is the Current Pain Management Adequate: Adequate How does your wound impact your activities of daily livingo Sleep: No Bathing: No Appetite: No Relationship With Others: No Bladder Continence: No Emotions: No Bowel Continence: No Work: No Toileting: No Drive: No Dressing: No Hobbies: No Engineer, maintenance) Signed: 02/24/2022 6:11:27 PM By: [name] Pilling RN, BSN Entered By: [name] on 02/24/2022 15:04:06 -------------------------------------------------------------------------------- Patient/Caregiver Education Details Patient Name: Date of Service: [name], [name] [name] 1/4/2024andnbsp3:00 PM Medical Record Number: [id number] Patient Account Number: [id number] Date of Birth/Gender: Treating RN: [birth year] (67 y.o. [name], [name] Primary Care Physician: [name] Other Clinician: Referring Physician: Treating Physician/Extender: [name] in Treatment: 20 Education Assessment Education Provided To: Patient Education Topics Provided Offloading: Handouts: What is [employer], [place] ([id number]) [phone].pdf Page 6 of 8 Methods: Explain/Verbal Electronic Signature(s) Signed: 02/24/2022 6:11:27 PM By: [name] Pilling RN, BSN Entered By: [name] on 02/24/2022 15:13:16 -------------------------------------------------------------------------------- Wound Assessment Details Patient Name: Date of Service: [name], [name] 02/24/2022 3:00 PM Medical Record Number: [id number] Patient Account Number: [id number] Date of Birth/Sex: Treating RN: 02/21/1956 (67 y.o. [name], [name] Primary Care Emsley Custer: [name] Other Clinician: Referring Lukka Black: Treating Ka Bench/Extender: [name] in Treatment: 20 Wound Status Wound Number: 5R Primary Diabetic Wound/Ulcer of the  Lower Extremity Etiology: Wound Location: Right, Lateral Foot Wound Open Wounding Event: Blister Status: Date Acquired: 11/22/2021 Comorbid Sleep Apnea, Coronary Artery Disease, Hypertension, Type II Weeks Of Treatment: 13 History: Diabetes, Neuropathy Clustered Wound: No Photos Wound Measurements Length: (cm) 0.2 Width: (cm) 0.2 Depth: (cm) 0.2 Area: (cm) 0.031 Volume: (cm) 0.006 % Reduction in Area: 98.2% % Reduction in Volume: 98.9% Epithelialization: Large (67-100%) Tunneling: No Undermining: No Wound Description Classification: Grade 2 Wound Margin: Distinct, outline attached Exudate Amount: Small Exudate Type: Serosanguineous Exudate Color: red, brown Foul Odor After Cleansing: No Slough/Fibrino No Wound Bed Granulation Amount: Large (67-100%) Exposed Structure Granulation Quality: Red Fascia Exposed: No Necrotic Amount: None Present (0%) Fat Layer (Subcutaneous Tissue) Exposed: Yes Tendon Exposed: No Muscle Exposed: No Joint Exposed: No Bone Exposed: No Periwound Skin Texture Texture Color No Abnormalities Noted: No No Abnormalities Noted: Yes Callus: Yes Temperature / Pain Crepitus: No Temperature: No Abnormality [name], [name] ([id number]) [id number].pdf Page 7 of 8 Excoriation: No Tenderness on Palpation: Yes Induration: No Rash: No Scarring: No Moisture No Abnormalities Noted: No Dry / Scaly: No Maceration: Yes Treatment Notes Wound #5R (Foot) Wound Laterality: Right, Lateral Cleanser Soap and Water Discharge Instruction: May shower and wash wound with dial antibacterial soap and water prior to dressing change. Peri-Wound Care Skin Prep Discharge Instruction: Use skin prep as directed Topical Primary Dressing Hydrofera Blue Ready Transfer Foam, 4x5 (in/in) Discharge Instruction: Apply to wound bed as instructed MediHoney Gel, tube 1.5 (oz) Discharge Instruction: Apply to wound bed as  instructed Secondary  Dressing Optifoam Non-Adhesive Dressing, 4x4 in Discharge Instruction: Apply over primary dressing as directed. Woven Gauze Sponge, Non-Sterile 4x4 in Discharge Instruction: Apply over primary dressing as directed. Secured With 59M Medipore H Soft Cloth Surgical T ape, 4 x 10 (in/yd) Discharge Instruction: Secure with tape as directed. Compression Wrap Compression Stockings Add-Ons Electronic Signature(s) Signed: 02/24/2022 4:16:52 PM By: Erenest Blank Signed: 02/24/2022 6:11:27 PM By: [name] Pilling RN, BSN Entered By: Erenest Blank on 02/24/2022 15:14:26 -------------------------------------------------------------------------------- Vitals Details Patient Name: Date of Service: [name], [name] 02/24/2022 3:00 PM Medical Record Number: [id number] Patient Account Number: [id number] Date of Birth/Sex: Treating RN: 24-Apr-1955 (67 y.o. [name], [name] Primary Care Tiffanny Lamarche: [name] Other Clinician: Referring Felicha Frayne: Treating Arliene Rosenow/Extender: [name] in Treatment: 20 Vital Signs Time Taken: 15:00 Pulse (bpm): 91 Respiratory Rate (breaths/min): 20 Blood Pressure (mmHg): 125/80 Capillary Blood Glucose (mg/dl): 116 Reference Range: 80 - 120 mg / dl [name], [name] ([id number]) [id number].pdf Page 8 of 8 Electronic Signature(s) Signed: 02/24/2022 6:11:27 PM By: [name] Pilling RN, BSN Entered By: [name] on 02/24/2022 15:05:04

## 2022-02-25 NOTE — Progress Notes (Signed)
[name], [name] ([id number]) [id number] Page 1 of 11 Visit Report for 02/24/2022 Chief Complaint Document Details Patient Name: Date of Service: [name], [name] 02/24/2022 3:00 PM Medical Record Number: [id number] Patient Account Number: [id number] Date of Birth/Sex: Treating RN: [birth year] (67 y.o. F) Primary Care Provider: Geoffery [name] Other Clinician: Referring Provider: Treating Provider/Extender: [name] in Treatment: 20 Information Obtained from: Patient Chief Complaint 10/27; Right Plantar foot wound 11/21; 2 small areas of skin breakdown to the right foot following cast placement 8/29; right plantar foot wound Electronic Signature(s) Signed: 02/24/2022 3:33:48 PM By: [name] Shan DO Entered By: [name] on 02/24/2022 15:30:33 -------------------------------------------------------------------------------- Debridement Details Patient Name: Date of Service: [name], [name] 02/24/2022 3:00 PM Medical Record Number: [id number] Patient Account Number: [id number] Date of Birth/Sex: Treating RN: 05-12-1955 (67 y.o. [name], [name] Primary Care Provider: Geoffery [name] Other Clinician: Referring Provider: Treating Provider/Extender: [name] in Treatment: 20 Debridement Performed for Assessment: Wound #5R Right,Lateral Foot Performed By: Physician [name] Shan, DO Debridement Type: Debridement Severity of Tissue Pre Debridement: Fat layer exposed Level of Consciousness (Pre-procedure): Awake and Alert Pre-procedure Verification/Time Out Yes - 15:20 Taken: Start Time: 15:21 Pain Control: Lidocaine 4% T opical Solution T Area Debrided (L x W): otal 0.4 (cm) x 0.4 (cm) = 0.16 (cm) Tissue and other material debrided: Viable, Non-Viable, Callus, Slough, Subcutaneous, Slough Level: Skin/Subcutaneous Tissue Debridement Description:  Excisional Instrument: Curette Bleeding: Minimum Hemostasis Achieved: Pressure End Time: 15:29 Procedural Pain: 0 Post Procedural Pain: 0 Response to Treatment: Procedure was tolerated well Level of Consciousness (Post- Awake and Alert procedure): Post Debridement Measurements of Total Wound Length: (cm) 0.2 Width: (cm) 0.2 Depth: (cm) 0.2 Volume: (cm) 0.006 [name], [name] ([id number]) [id number] Page 2 of 11 Character of Wound/Ulcer Post Debridement: Improved Severity of Tissue Post Debridement: Fat layer exposed Post Procedure Diagnosis Same as Pre-procedure Electronic Signature(s) Signed: 02/24/2022 3:33:48 PM By: [name] Shan DO Signed: 02/24/2022 6:11:27 PM By: [name] Pilling RN, BSN Entered By: [name] on 02/24/2022 15:29:14 -------------------------------------------------------------------------------- HPI Details Patient Name: Date of Service: [place], [name] 02/24/2022 3:00 PM Medical Record Number: [id number] Patient Account Number: [id number] Date of Birth/Sex: Treating RN: June 14, 1955 (67 y.o. F) Primary Care Provider: Geoffery [name] Other Clinician: Referring Provider: Treating Provider/Extender: [name] in Treatment: 20 History of Present Illness HPI Description: Admission 12/17/2020 [name] is [name] 67 year old female with [name] past medical history of insulin-dependent type 2 diabetes, hypothyroidism and daily1 pack per day cigarette smoker the presents to the clinic for [name] 6-week history of nonhealing wound to the right first MTPJ. She has been following with [name], podiatry for this issue. She has been using silver alginate with dressing changes. She uses [name] postsurgical [name] and offloading pads. She currently denies signs of infection. 10/31; patient presents for follow-up. She tolerated the soft cast fine although she states that she felt her foot rolling to one side.  She denies signs of infection. She would like to do the total contact cast today. 12/23/2020 upon evaluation today patient appears to be doing excellent in regard to her wound on the foot and she is in [name] total contact cast. I do think this is appropriate this is the first cast change which we are obliged to do to ensure nothing is rubbing everything appears to be doing quite well and very pleased in that regard.  11/7; patient presents for follow-up. She had no issues with the cast. She denies signs of infection. 11/14; patient presents for 1 week follow-up. She has had no issues with the cast. She denies signs of infection. 11/21; patient presents for 1 week follow-up. She did develop 2 small areas of skin breakdown on either side of the right foot from the cast rubbing. She states she did not feel the wounds developing. She currently denies signs of infection. 11/28; patient presents for 1 week follow-up. She has no issues or complaints today. She denies pain or acute signs of infection. 12/12; patient presents for follow-up. She reports improvement to her right lateral foot wound. She has been using silver alginate to the area. She has no issues or complaints today. 12/19; patient presents for follow-up. She reports that her right lateral foot wound has healed. She has no issues or complaints today. 1/3; patient presents for follow-up. She has no issues or complaints today. She reports no open wounds. Readmission 10/01/2021 [name] is [name] 67 year old female's with [name] past medical history of insulin-dependent controlled type 2 diabetes complicated by peripheral neuropathy that presents to the clinic for [name] 45-monthhistory of nonhealing ulcer to the right foot. I have seen her before for [name] wound to the same area that was treated and healed. Her wound today however this is much deeper and it has thick yellow drainage. She is not sure exactly how it started. She noticed it 1 day. She has been  following with Dr. EAmalia Haileyfor this issue. She had [name] wound culture that showed [name] mix of organisms on 09/01/2021. She was recently started on doxycycline. She is using the dConservation officer, nature She has been using Iodosorb with dressing changes. She currently denies systemic signs of infection. 8/21; patient presents for follow-up. She states she started and completed levofloxacin. She is still taking doxycycline. She reports improvement in drainage and odor. She has been doing Dakin's wet-to-dry dressings as well. She is using her front offloading [name]. She denies signs of infection. 8/29; patient presents for follow-up. She is still taking doxycycline. She continues to report improvement in drainage and reports no odor. She denies any purulent drainage. She has been doing silver alginate to the wound bed and using her front offloading [name]. 9/5; patient presents for follow-up. She completed the course of levofloxacin. She has someone that is able to drive her today so we can place the total contact cast. She reports no signs of infection. We have been doing silver alginate to the wound bed. 9/7; patient presents for follow-up. She has tolerated the total contact cast well. She presents for her obligatory cast change. She has no issues or complaints today. 9/11; patient presents for follow-up. She tolerated the total contact cast well. She has no issues or complaints today. We discussed potentially doing [name] skin substitute and patient would like to see if her insurance will cover this. 9/18; patient presents for follow-up. She had no issues with the total contact cast. She been approved for Grafix and patient would like to have this placed today. MRONALDA, WALPOLE([id number] Page 3 of 11 9/25; patient presents for follow-up. Grafix #1 was placed in standard fashion at last clinic visit. She has no issues or complaints today. She tolerated the cast well. 10/2; patient  presents for follow-up. Grafix #2 was placed in standard fashion at last clinic visit. Unfortunately she developed [name] wound to the lateral aspect of the foot over the past week.  She states she has been on her feet [name] lot more and walking more. She denies systemic signs of infection. 10/9; patient presents for follow-up. Grafix #3 was placed in standard at last clinic visit. The plantar foot wound has healed with this. She developed [name] new wound to the lateral aspect last week and this has gotten larger. She reports soreness to this area. She denies systemic signs of infection. 10/16; patient presents for follow-up. Her plantar wound continues to remain closed. Her lateral wound has improved with the use of Hydrofera Blue. She has no issues or complaints today. She denies signs of infection. She has almost completed her course of antibiotics prescribed at last clinic visit. 10/23; patient presents for follow-up. She has been using Hydrofera Blue and Medihoney to the wound bed. She has no issues or complaints today. She has been using her surgical [name]. 10/30; patient presents for follow-up. We have been using Hydrofera Blue and Medihoney to the wound bed. Patient has no issues or complaints today. 11/10; patient presents for follow-up. She has been using Hydrofera Blue and Medihoney without issues. She has been wearing her Orthotics. It is unclear if she is offloading this area or not. 11/17; patient presents for follow-up. She has been using Hydrofera Blue and Medihoney to the foot wound without issues. Grafix was available today for placement and patient would like to proceed with this. 12/4; patient presents for follow-up. Grafix was placed at last clinic visit. Her wound is healed. She has no issues or complaints today. She is reported no drainage from the previous wound site. 12/8; patient states that her podiatrist removed [name] callus on the previous wound site to the lateral right foot and there is [name]  wound present. She currently denies signs of infection. 12/15; patient presents for follow-up. We have been using Grafix to the wound bed. This was placed in standard fashion today. 12/21; patient presents for follow-up. We have been using Grafix to the wound bed. She completed her oral antibiotics. She has no issues or complaints today. 12/28; patient presents for follow-up. We have been using Grafix to the wound bed. She has reported no drainage. She is prepared for the total contact cast today. Her husband is present. 1/4; patient presents for follow-up. We use Grafix under the total contact cast at last clinic visit. Unfortunately her cast protector bag broke and the cast got wet in the shower. Her foot is macerated today. She denies signs of infection. Electronic Signature(s) Signed: 02/24/2022 3:33:48 PM By: [name] Shan DO Entered By: [name] on 02/24/2022 15:31:10 -------------------------------------------------------------------------------- Physical Exam Details Patient Name: Date of Service: [name], [name] 02/24/2022 3:00 PM Medical Record Number: [id number] Patient Account Number: [id number] Date of Birth/Sex: Treating RN: [birth year] (67 y.o. F) Primary Care Provider: Geoffery [name] Other Clinician: Referring Provider: Treating Provider/Extender: [name] in Treatment: 20 Constitutional respirations regular, non-labored and within target range for patient.. Cardiovascular 2+ dorsalis pedis/posterior tibialis pulses. Psychiatric pleasant and cooperative. Notes T the lateral aspect of the right foot there is [name] small open wound with Granulation tissue and nonviable tissue. Post-debridement healthy granulation tissue o present. No signs of surrounding infection. Plantar and lateral aspect of the foot is macerated. Electronic Signature(s) Signed: 02/24/2022 3:33:48 PM By: [name] Shan DO Entered By: [name] on  02/24/2022 15:31:57 [name], [name] ([id number]) [id number] Page 4 of 11 -------------------------------------------------------------------------------- Physician Orders Details Patient Name: Date of Service: [name], [name]  02/24/2022 3:00 PM Medical Record Number: [id number] Patient Account Number: [id number] Date of Birth/Sex: Treating RN: Sep 30, 1955 (67 y.o. [name], [name] Primary Care Provider: Geoffery [name] Other Clinician: Referring Provider: Treating Provider/Extender: [name] in Treatment: 20 Verbal / Phone Orders: No Diagnosis Coding ICD-10 Coding Code Description [phone] Non-pressure chronic ulcer of other part of right foot with fat layer exposed E11.621 Type 2 diabetes mellitus with foot ulcer E11.42 Type 2 diabetes mellitus with diabetic polyneuropathy Follow-up Appointments ppointment in 1 week. - w/ Dr. Heber [place] 03/04/2022 0900 Friday Return [name] ppointment in 2 weeks. - Dr. Heber [place] 03/11/2022 0930 Friday Return [name] Anesthetic (In clinic) Topical Lidocaine 4% applied to wound bed Cellular or Tissue Based Products Cellular or Tissue Based Product Type: - Run IVR for grafix 12/30/21 order Grafix 01/07/2022 Grafix #4 applied 01/28/2022 Grafix #5 applied 02/03/22 Grafix # 6 applied 02/10/22 Grafix # 7 applied 02/17/22 Grafix # 8 applied Order grafix 45m disk next week 03/04/2022. Cellular or Tissue Based Product applied to wound bed, secured with steri-strips, cover with Adaptic or Mepitel. (DO NOT REMOVE). Bathing/ Shower/ Hygiene May shower with protection but do not get wound dressing(s) wet. Protect dressing(s) with water repellant cover (for example, large plastic bag) or [name] cast cover and may then take shower. Edema Control - Lymphedema / SCD / Other Avoid standing for long periods of time. Off-Loading Total Contact Cast to Right Lower Extremity - HOLD this week due to feet maceration.  Will resume next week. Wound Treatment Wound #5R - Foot Wound Laterality: Right, Lateral Cleanser: Soap and Water 1 x Per Week/7 Days Discharge Instructions: May shower and wash wound with dial antibacterial soap and water prior to dressing change. Peri-Wound Care: Skin Prep 1 x Per Week/7 Days Discharge Instructions: Use skin prep as directed Prim Dressing: Hydrofera Blue Ready Transfer Foam, 4x5 (in/in) 1 x Per Week/7 Days ary Discharge Instructions: Apply to wound bed as instructed Prim Dressing: MediHoney Gel, tube 1.5 (oz) 1 x Per Week/7 Days ary Discharge Instructions: Apply to wound bed as instructed Secondary Dressing: Optifoam Non-Adhesive Dressing, 4x4 in 1 x Per Week/7 Days Discharge Instructions: Apply over primary dressing as directed. Secondary Dressing: Woven Gauze Sponge, Non-Sterile 4x4 in 1 x Per Week/7 Days Discharge Instructions: Apply over primary dressing as directed. Secured With: 70M Medipore H Soft Cloth Surgical T ape, 4 x 10 (in/yd) 1 x Per Week/7 Days Discharge Instructions: Secure with tape as directed. [name], [name]([id number] Page 5 of 11 Electronic Signature(s) Signed: 02/24/2022 3:33:48 PM By: HKalman ShanDO Entered By: HKalman Shanon 02/24/2022 15:32:05 -------------------------------------------------------------------------------- Problem List Details Patient Name: Date of Service: [name], [name] 02/24/2022 3:00 PM Medical Record Number: [id number] Account Number: [id number] of Birth/Sex: Treating RN: 110/29/1957((67y.o. FHelene [name] BTammi KlippelPrimary Care Provider: AGeoffery LyonsOther Clinician: Referring Provider: Treating Provider/Extender: HBaird Kayin Treatment: 20 Active Problems ICD-10 Encounter Code Description Active Date MDM Diagnosis L97.512 Non-pressure chronic ulcer of other part of right foot with fat layer exposed 10/01/2021  No Yes E11.621 Type 2 diabetes mellitus with foot ulcer 10/01/2021 No Yes E11.42 Type 2 diabetes mellitus with diabetic polyneuropathy 10/01/2021 No Yes Inactive Problems Resolved Problems Electronic Signature(s) Signed: 02/24/2022 3:33:48 PM By: HKalman ShanDO Entered By: HKalman Shanon 02/24/2022 15:30:19 -------------------------------------------------------------------------------- Progress Note Details Patient Name: [name] 02/24/2022 3:00 PM Medical Record Number: [id number]  Patient Account Number: [id number] Date of Birth/Sex: Treating RN: November 26, 1955 (67 y.o. F) Primary Care Provider: Geoffery [name] Other Clinician: Referring Provider: Treating Provider/Extender: [name] in Treatment: 20 Subjective Chief Complaint Information obtained from Patient 10/27; Right Plantar foot wound 11/21; 2 small areas of skin breakdown to the right foot following cast placement 8/29; right plantar foot wound History of Present Illness (HPI) Admission 12/17/2020 Ms. Le Ferraz is [name] 67 year old female with [name] past medical history of insulin-dependent type 2 diabetes, hypothyroidism and daily1 pack per day cigarette smoker the presents to the clinic for [name] 6-week history of nonhealing wound to the right first MTPJ. She has been following with [name], podiatry for Harold Hedge ([id number]) [id number] Page 6 of 11 this issue. She has been using silver alginate with dressing changes. She uses [name] postsurgical [name] and offloading pads. She currently denies signs of infection. 10/31; patient presents for follow-up. She tolerated the soft cast fine although she states that she felt her foot rolling to one side. She denies signs of infection. She would like to do the total contact cast today. 12/23/2020 upon evaluation today patient appears to be doing excellent in regard to her wound on the  foot and she is in [name] total contact cast. I do think this is appropriate this is the first cast change which we are obliged to do to ensure nothing is rubbing everything appears to be doing quite well and very pleased in that regard. 11/7; patient presents for follow-up. She had no issues with the cast. She denies signs of infection. 11/14; patient presents for 1 week follow-up. She has had no issues with the cast. She denies signs of infection. 11/21; patient presents for 1 week follow-up. She did develop 2 small areas of skin breakdown on either side of the right foot from the cast rubbing. She states she did not feel the wounds developing. She currently denies signs of infection. 11/28; patient presents for 1 week follow-up. She has no issues or complaints today. She denies pain or acute signs of infection. 12/12; patient presents for follow-up. She reports improvement to her right lateral foot wound. She has been using silver alginate to the area. She has no issues or complaints today. 12/19; patient presents for follow-up. She reports that her right lateral foot wound has healed. She has no issues or complaints today. 1/3; patient presents for follow-up. She has no issues or complaints today. She reports no open wounds. Readmission 10/01/2021 Ms. Yaira Bernardi is [name] 67 year old female's with [name] past medical history of insulin-dependent controlled type 2 diabetes complicated by peripheral neuropathy that presents to the clinic for [name] 33-monthhistory of nonhealing ulcer to the right foot. I have seen her before for [name] wound to the same area that was treated and healed. Her wound today however this is much deeper and it has thick yellow drainage. She is not sure exactly how it started. She noticed it 1 day. She has been following with Dr. EAmalia Haileyfor this issue. She had [name] wound culture that showed [name] mix of organisms on 09/01/2021. She was recently started on doxycycline. She is using the dConservation officer, nature  She has been using Iodosorb with dressing changes. She currently denies systemic signs of infection. 8/21; patient presents for follow-up. She states she started and completed levofloxacin. She is still taking doxycycline. She reports improvement in drainage and odor. She has been doing Dakin's wet-to-dry dressings as well. She is using her  front offloading [name]. She denies signs of infection. 8/29; patient presents for follow-up. She is still taking doxycycline. She continues to report improvement in drainage and reports no odor. She denies any purulent drainage. She has been doing silver alginate to the wound bed and using her front offloading [name]. 9/5; patient presents for follow-up. She completed the course of levofloxacin. She has someone that is able to drive her today so we can place the total contact cast. She reports no signs of infection. We have been doing silver alginate to the wound bed. 9/7; patient presents for follow-up. She has tolerated the total contact cast well. She presents for her obligatory cast change. She has no issues or complaints today. 9/11; patient presents for follow-up. She tolerated the total contact cast well. She has no issues or complaints today. We discussed potentially doing [name] skin substitute and patient would like to see if her insurance will cover this. 9/18; patient presents for follow-up. She had no issues with the total contact cast. She been approved for Grafix and patient would like to have this placed today. 9/25; patient presents for follow-up. Grafix #1 was placed in standard fashion at last clinic visit. She has no issues or complaints today. She tolerated the cast well. 10/2; patient presents for follow-up. Grafix #2 was placed in standard fashion at last clinic visit. Unfortunately she developed [name] wound to the lateral aspect of the foot over the past week. She states she has been on her feet [name] lot more and walking more. She denies systemic signs of  infection. 10/9; patient presents for follow-up. Grafix #3 was placed in standard at last clinic visit. The plantar foot wound has healed with this. She developed [name] new wound to the lateral aspect last week and this has gotten larger. She reports soreness to this area. She denies systemic signs of infection. 10/16; patient presents for follow-up. Her plantar wound continues to remain closed. Her lateral wound has improved with the use of Hydrofera Blue. She has no issues or complaints today. She denies signs of infection. She has almost completed her course of antibiotics prescribed at last clinic visit. 10/23; patient presents for follow-up. She has been using Hydrofera Blue and Medihoney to the wound bed. She has no issues or complaints today. She has been using her surgical [name]. 10/30; patient presents for follow-up. We have been using Hydrofera Blue and Medihoney to the wound bed. Patient has no issues or complaints today. 11/10; patient presents for follow-up. She has been using Hydrofera Blue and Medihoney without issues. She has been wearing her Orthotics. It is unclear if she is offloading this area or not. 11/17; patient presents for follow-up. She has been using Hydrofera Blue and Medihoney to the foot wound without issues. Grafix was available today for placement and patient would like to proceed with this. 12/4; patient presents for follow-up. Grafix was placed at last clinic visit. Her wound is healed. She has no issues or complaints today. She is reported no drainage from the previous wound site. 12/8; patient states that her podiatrist removed [name] callus on the previous wound site to the lateral right foot and there is [name] wound present. She currently denies signs of infection. 12/15; patient presents for follow-up. We have been using Grafix to the wound bed. This was placed in standard fashion today. 12/21; patient presents for follow-up. We have been using Grafix to the wound bed. She  completed her oral antibiotics. She has no issues or complaints today.  12/28; patient presents for follow-up. We have been using Grafix to the wound bed. She has reported no drainage. She is prepared for the total contact cast today. Her husband is present. 1/4; patient presents for follow-up. We use Grafix under the total contact cast at last clinic visit. Unfortunately her cast protector bag broke and the cast got wet in the shower. Her foot is macerated today. She denies signs of infection. Patient History [name], [name] ([id number]) [id number] Page 7 of 11 Information obtained from Patient, Chart. Family History Unknown History. Social History Current every day smoker - 1 pack/[name]/day, Alcohol Use - Never, Drug Use - No History, Caffeine Use - Moderate. Medical History Eyes Denies history of Cataracts, Glaucoma, Optic Neuritis Ear/Nose/Mouth/Throat Denies history of Chronic sinus problems/congestion, Middle ear problems Hematologic/Lymphatic Denies history of Anemia, Hemophilia, Human Immunodeficiency Virus, Lymphedema, Sickle Cell Disease Respiratory Patient has history of Sleep Apnea Denies history of Aspiration, Asthma, Chronic Obstructive Pulmonary Disease (COPD), Pneumothorax, Tuberculosis Cardiovascular Patient has history of Coronary Artery Disease, Hypertension Denies history of Angina, Arrhythmia, Congestive Heart Failure, Deep Vein Thrombosis, Hypotension, Myocardial Infarction, Peripheral Arterial Disease, Peripheral Venous Disease, Phlebitis, Vasculitis Gastrointestinal Denies history of Cirrhosis , Colitis, Crohnoos, Hepatitis [name], Hepatitis B, Hepatitis C Endocrine Patient has history of Type II Diabetes Denies history of Type I Diabetes Genitourinary Denies history of End Stage Renal Disease Immunological Denies history of Lupus Erythematosus, Raynaudoos, Scleroderma Integumentary (Skin) Denies history of History of  Burn Musculoskeletal Denies history of Gout, Rheumatoid Arthritis, Osteoarthritis, Osteomyelitis Neurologic Patient has history of Neuropathy Denies history of Dementia, Quadriplegia, Paraplegia, Seizure Disorder Hospitalization/Surgery History - 2nd and 3rd right toe amps. - total hysterectomy. - cholecystectomy. - 2 knee surgery's on left and 1 on right. Medical [name] Surgical History Notes nd Cardiovascular hypercholesterolemia Objective Constitutional respirations regular, non-labored and within target range for patient.. Vitals Time Taken: 3:00 PM, Pulse: 91 bpm, Respiratory Rate: 20 breaths/min, Blood Pressure: 125/80 mmHg, Capillary Blood Glucose: 116 mg/dl. Cardiovascular 2+ dorsalis pedis/posterior tibialis pulses. Psychiatric pleasant and cooperative. General Notes: T the lateral aspect of the right foot there is [name] small open wound with Granulation tissue and nonviable tissue. Post-debridement healthy o granulation tissue present. No signs of surrounding infection. Plantar and lateral aspect of the foot is macerated. Integumentary (Hair, Skin) Wound #5R status is Open. Original cause of wound was Blister. The date acquired was: 11/22/2021. The wound has been in treatment 13 weeks. The wound is located on the Right,Lateral Foot. The wound measures 0.2cm length x 0.2cm width x 0.2cm depth; 0.031cm^2 area and 0.006cm^3 volume. There is Fat Layer (Subcutaneous Tissue) exposed. There is no tunneling or undermining noted. There is [name] small amount of serosanguineous drainage noted. The wound margin is distinct with the outline attached to the wound base. There is large (67-100%) red granulation within the wound bed. There is no necrotic tissue within the wound bed. The periwound skin appearance had no abnormalities noted for color. The periwound skin appearance exhibited: Callus, Maceration. The periwound skin appearance did not exhibit: Crepitus, Excoriation, Induration, Rash, Scarring,  Dry/Scaly. Periwound temperature was noted as No Abnormality. The periwound has tenderness on palpation. Assessment Active Problems ICD-10 [name], [name] ([id number]) [id number] Page 8 of 11 Non-pressure chronic ulcer of other part of right foot with fat layer exposed Type 2 diabetes mellitus with foot ulcer Type 2 diabetes mellitus with diabetic polyneuropathy Unfortunately patient's cast got wet in the shower due to her cast protector bag breaking. Her foot is macerated. I  did debride nonviable tissue to the wound bed and recommended Hydrofera Blue and Medihoney. Next week plan is for skin sub under the total contact cast. Follow-up in 1 week. Procedures Wound #5R Pre-procedure diagnosis of Wound #5R is [name] Diabetic Wound/Ulcer of the Lower Extremity located on the Right,Lateral Foot .Severity of Tissue Pre Debridement is: Fat layer exposed. There was [name] Excisional Skin/Subcutaneous Tissue Debridement with [name] total area of 0.16 sq cm performed by [name] Shan, DO. With the following instrument(s): Curette to remove Viable and Non-Viable tissue/material. Material removed includes Callus, Subcutaneous Tissue, and Slough after achieving pain control using Lidocaine 4% Topical Solution. [name] time out was conducted at 15:20, prior to the start of the procedure. [name] Minimum amount of bleeding was controlled with Pressure. The procedure was tolerated well with [name] pain level of 0 throughout and [name] pain level of 0 following the procedure. Post Debridement Measurements: 0.2cm length x 0.2cm width x 0.2cm depth; 0.006cm^3 volume. Character of Wound/Ulcer Post Debridement is improved. Severity of Tissue Post Debridement is: Fat layer exposed. Post procedure Diagnosis Wound #5R: Same as Pre-Procedure Plan Follow-up Appointments: Return Appointment in 1 week. - w/ Dr. Heber [place] 03/04/2022 0900 Friday Return Appointment in 2 weeks. - Dr. Heber [place] 03/11/2022 0930  Friday Anesthetic: (In clinic) Topical Lidocaine 4% applied to wound bed Cellular or Tissue Based Products: Cellular or Tissue Based Product Type: - Run IVR for grafix 12/30/21 order Grafix 01/07/2022 Grafix #4 applied 01/28/2022 Grafix #5 applied 02/03/22 Grafix # 6 applied 02/10/22 Grafix # 7 applied 02/17/22 Grafix # 8 applied Order grafix 60m disk next week 03/04/2022. Cellular or Tissue Based Product applied to wound bed, secured with steri-strips, cover with Adaptic or Mepitel. (DO NOT REMOVE). Bathing/ Shower/ Hygiene: May shower with protection but do not get wound dressing(s) wet. Protect dressing(s) with water repellant cover (for example, large plastic bag) or [name] cast cover and may then take shower. Edema Control - Lymphedema / SCD / Other: Avoid standing for long periods of time. Off-Loading: T Contact Cast to Right Lower Extremity - HOLD this week due to feet maceration. Will resume next week. otal WOUND #5R: - Foot Wound Laterality: Right, Lateral Cleanser: Soap and Water 1 x Per Week/7 Days Discharge Instructions: May shower and wash wound with dial antibacterial soap and water prior to dressing change. Peri-Wound Care: Skin Prep 1 x Per Week/7 Days Discharge Instructions: Use skin prep as directed Prim Dressing: Hydrofera Blue Ready Transfer Foam, 4x5 (in/in) 1 x Per Week/7 Days ary Discharge Instructions: Apply to wound bed as instructed Prim Dressing: MediHoney Gel, tube 1.5 (oz) 1 x Per Week/7 Days ary Discharge Instructions: Apply to wound bed as instructed Secondary Dressing: Optifoam Non-Adhesive Dressing, 4x4 in 1 x Per Week/7 Days Discharge Instructions: Apply over primary dressing as directed. Secondary Dressing: Woven Gauze Sponge, Non-Sterile 4x4 in 1 x Per Week/7 Days Discharge Instructions: Apply over primary dressing as directed. Secured With: 53M Medipore H Soft Cloth Surgical T ape, 4 x 10 (in/yd) 1 x Per Week/7 Days Discharge Instructions: Secure with tape  as directed. 1. In office sharp debridement 2. Hydrofera Blue and Medihoney 3. Aggressive offloadingoosurgical [name] and foam donut 4. Follow-up in 1 week Electronic Signature(s) Signed: 02/24/2022 3:33:48 PM By: HKalman ShanDO Entered By: HKalman Shanon 02/24/2022 15:33:14 [name], [name] ([id number] Page 9 of 11 -------------------------------------------------------------------------------- HxROS Details Patient Name: Date of Service: [name], [name] 02/24/2022 3:00 PM Medical Record Number: [id number] Account Number: [id number]  Date of Birth/Sex: Treating RN: 12-13-1955 (67 y.o. F) Primary Care Provider: Geoffery [name] Other Clinician: Referring Provider: Treating Provider/Extender: [name] in Treatment: 20 Information Obtained From Patient Chart Eyes Medical History: Negative for: Cataracts; Glaucoma; Optic Neuritis Ear/Nose/Mouth/Throat Medical History: Negative for: Chronic sinus problems/congestion; Middle ear problems Hematologic/Lymphatic Medical History: Negative for: Anemia; Hemophilia; Human Immunodeficiency Virus; Lymphedema; Sickle Cell Disease Respiratory Medical History: Positive for: Sleep Apnea Negative for: Aspiration; Asthma; Chronic Obstructive Pulmonary Disease (COPD); Pneumothorax; Tuberculosis Cardiovascular Medical History: Positive for: Coronary Artery Disease; Hypertension Negative for: Angina; Arrhythmia; Congestive Heart Failure; Deep Vein Thrombosis; Hypotension; Myocardial Infarction; Peripheral Arterial Disease; Peripheral Venous Disease; Phlebitis; Vasculitis Past Medical History Notes: hypercholesterolemia Gastrointestinal Medical History: Negative for: Cirrhosis ; Colitis; Crohns; Hepatitis [name]; Hepatitis B; Hepatitis C Endocrine Medical History: Positive for: Type II Diabetes Negative for: Type I Diabetes Genitourinary Medical  History: Negative for: End Stage Renal Disease Immunological Medical History: Negative for: Lupus Erythematosus; Raynauds; Scleroderma Integumentary (Skin) Medical History: Negative for: History of Burn Musculoskeletal Medical History: Negative for: Gout; Rheumatoid Arthritis; Osteoarthritis; Osteomyelitis Neurologic Medical History: Positive for: Neuropathy Negative for: Dementia; Quadriplegia; Paraplegia; Seizure Disorder [name], [name] ([id number]) [id number] Page 10 of 11 Immunizations Pneumococcal Vaccine: Received Pneumococcal Vaccination: Yes Received Pneumococcal Vaccination On or After 60th Birthday: Yes Tetanus Vaccine: Last tetanus shot: 12/17/2020 Implantable Devices No devices added Hospitalization / Surgery History Type of Hospitalization/Surgery 2nd and 3rd right toe amps total hysterectomy cholecystectomy 2 knee surgery's on left and 1 on right Family and Social History Unknown History: Yes; Current every day smoker - 1 pack/[name]/day; Alcohol Use: Never; Drug Use: No History; Caffeine Use: Moderate; Financial Concerns: No; Food, Clothing or Shelter Needs: No; Support System Lacking: No; Transportation Concerns: No Electronic Signature(s) Signed: 02/24/2022 3:33:48 PM By: [name] Shan DO Entered By: [name] on 02/24/2022 15:31:16 -------------------------------------------------------------------------------- SuperBill Details Patient Name: Date of Service: [name], [name] 02/24/2022 Medical Record Number: [id number] Patient Account Number: [id number] Date of Birth/Sex: Treating RN: 05/28/1955 (67 y.o. [name], [name] Primary Care Provider: Geoffery [name] Other Clinician: Referring Provider: Treating Provider/Extender: [name] in Treatment: 20 Diagnosis Coding ICD-10 Codes Code Description [phone] Non-pressure chronic ulcer of other part of right foot with fat layer  exposed E11.621 Type 2 diabetes mellitus with foot ulcer E11.42 Type 2 diabetes mellitus with diabetic polyneuropathy Facility Procedures : CPT4 Code: [id number] Description: 91505 - DEB SUBQ TISSUE 20 SQ CM/< ICD-10 Diagnosis Description L97.512 Non-pressure chronic ulcer of other part of right foot with fat layer exposed E11.621 Type 2 diabetes mellitus with foot ulcer Modifier: Quantity: 1 Physician Procedures : CPT4 Code Description Modifier [id number] - WC PHYS SUBQ TISS 20 SQ CM ICD-10 Diagnosis Description L97.512 Non-pressure chronic ulcer of other part of right foot with fat layer exposed E11.621 Type 2 diabetes mellitus with foot ulcer Quantity: 1 Electronic Signature(s) Signed: 02/24/2022 3:33:48 PM By: Chiquita Loth, [place] ([id number]) [id number] Page 11 of 11 Entered By: [name] on 02/24/2022 15:33:25

## 2022-03-04 ENCOUNTER — Encounter (HOSPITAL_BASED_OUTPATIENT_CLINIC_OR_DEPARTMENT_OTHER): Payer: Medicare Other | Admitting: Internal Medicine

## 2022-03-04 DIAGNOSIS — L97512 Non-pressure chronic ulcer of other part of right foot with fat layer exposed: Secondary | ICD-10-CM | POA: Diagnosis not present

## 2022-03-04 DIAGNOSIS — E11621 Type 2 diabetes mellitus with foot ulcer: Secondary | ICD-10-CM | POA: Diagnosis not present

## 2022-03-04 DIAGNOSIS — F17210 Nicotine dependence, cigarettes, uncomplicated: Secondary | ICD-10-CM | POA: Diagnosis not present

## 2022-03-04 DIAGNOSIS — Z794 Long term (current) use of insulin: Secondary | ICD-10-CM | POA: Diagnosis not present

## 2022-03-04 DIAGNOSIS — E1142 Type 2 diabetes mellitus with diabetic polyneuropathy: Secondary | ICD-10-CM

## 2022-03-04 NOTE — Progress Notes (Signed)
[name], HEGGS ([id number]) [id number] Page 1 of 9 Visit Report for 03/04/2022 Chief Complaint Document Details Patient Name: Date of Service: [name], [name] 03/04/2022 9:00 [place] Record Number: [id number] Patient Account Number: [id number] Date of Birth/Sex: Treating RN: Aug 02, 1955 (67 y.o. F) Primary Care Provider: [name] Other Clinician: Referring Provider: Treating Provider/Extender: [name] in Treatment: 22 Information Obtained from: Patient Chief Complaint 10/27; Right Plantar foot wound 11/21; 2 small areas of skin breakdown to the right foot following cast placement 8/29; right plantar foot wound Electronic Signature(s) Signed: 03/04/2022 11:36:12 AM By: Kalman Shan DO Entered By: Kalman Shan on 03/04/2022 10:43:42 -------------------------------------------------------------------------------- HPI Details Patient Name: Date of Service: [name], [name] 03/04/2022 9:00 A M Medical Record Number: [id number] Patient Account Number: [id number] Date of Birth/Sex: Treating RN: 1955/12/14 (67 y.o. F) Primary Care Provider: [name] Other Clinician: Referring Provider: Treating Provider/Extender: [name] in Treatment: 22 History of Present Illness HPI Description: Admission 12/17/2020 [name] Didion is a 67 year old female with a past medical history of insulin-dependent type 2 diabetes, hypothyroidism and daily1 pack per day cigarette smoker the presents to the clinic for a 6-week history of nonhealing wound to the right first MTPJ. She has been following with [name], podiatry for this issue. She has been using silver alginate with dressing changes. She uses a postsurgical shoe and offloading pads. She currently denies signs of infection. 10/31; patient presents for follow-up. She tolerated the soft cast fine although  she states that she felt her foot rolling to one side. She denies signs of infection. She would like to do the total contact cast today. 12/23/2020 upon evaluation today patient appears to be doing excellent in regard to her wound on the foot and she is in a total contact cast. I do think this is appropriate this is the first cast change which we are obliged to do to ensure nothing is rubbing everything appears to be doing quite well and very pleased in that regard. 11/7; patient presents for follow-up. She had no issues with the cast. She denies signs of infection. 11/14; patient presents for 1 week follow-up. She has had no issues with the cast. She denies signs of infection. 11/21; patient presents for 1 week follow-up. She did develop 2 small areas of skin breakdown on either side of the right foot from the cast rubbing. She states she did not feel the wounds developing. She currently denies signs of infection. 11/28; patient presents for 1 week follow-up. She has no issues or complaints today. She denies pain or acute signs of infection. 12/12; patient presents for follow-up. She reports improvement to her right lateral foot wound. She has been using silver alginate to the area. She has no issues or complaints today. 12/19; patient presents for follow-up. She reports that her right lateral foot wound has healed. She has no issues or complaints today. 1/3; patient presents for follow-up. She has no issues or complaints today. She reports no open wounds. ZETTA, STONEMAN ([id number]) [id number] Page 2 of 9 Readmission 10/01/2021 [name] is a 67 year old female's with a past medical history of insulin-dependent controlled type 2 diabetes complicated by peripheral neuropathy that presents to the clinic for a 40-monthhistory of nonhealing ulcer to the right foot. I have seen her before for a wound to the same area that was treated and healed. Her wound today  however this  is much deeper and it has thick yellow drainage. She is not sure exactly how it started. She noticed it 1 day. She has been following with [name] for this issue. She had a wound culture that showed a mix of organisms on 09/01/2021. She was recently started on doxycycline. She is using the Conservation officer, nature. She has been using Iodosorb with dressing changes. She currently denies systemic signs of infection. 8/21; patient presents for follow-up. She states she started and completed levofloxacin. She is still taking doxycycline. She reports improvement in drainage and odor. She has been doing Dakin's wet-to-dry dressings as well. She is using her front offloading shoe. She denies signs of infection. 8/29; patient presents for follow-up. She is still taking doxycycline. She continues to report improvement in drainage and reports no odor. She denies any purulent drainage. She has been doing silver alginate to the wound bed and using her front offloading shoe. 9/5; patient presents for follow-up. She completed the course of levofloxacin. She has someone that is able to drive her today so we can place the total contact cast. She reports no signs of infection. We have been doing silver alginate to the wound bed. 9/7; patient presents for follow-up. She has tolerated the total contact cast well. She presents for her obligatory cast change. She has no issues or complaints today. 9/11; patient presents for follow-up. She tolerated the total contact cast well. She has no issues or complaints today. We discussed potentially doing a skin substitute and patient would like to see if her insurance will cover this. 9/18; patient presents for follow-up. She had no issues with the total contact cast. She been approved for Grafix and patient would like to have this placed today. 9/25; patient presents for follow-up. Grafix #1 was placed in standard fashion at last clinic visit. She has no issues or complaints  today. She tolerated the cast well. 10/2; patient presents for follow-up. Grafix #2 was placed in standard fashion at last clinic visit. Unfortunately she developed a wound to the lateral aspect of the foot over the past week. She states she has been on her feet a lot more and walking more. She denies systemic signs of infection. 10/9; patient presents for follow-up. Grafix #3 was placed in standard at last clinic visit. The plantar foot wound has healed with this. She developed a new wound to the lateral aspect last week and this has gotten larger. She reports soreness to this area. She denies systemic signs of infection. 10/16; patient presents for follow-up. Her plantar wound continues to remain closed. Her lateral wound has improved with the use of Hydrofera Blue. She has no issues or complaints today. She denies signs of infection. She has almost completed her course of antibiotics prescribed at last clinic visit. 10/23; patient presents for follow-up. She has been using Hydrofera Blue and Medihoney to the wound bed. She has no issues or complaints today. She has been using her surgical shoe. 10/30; patient presents for follow-up. We have been using Hydrofera Blue and Medihoney to the wound bed. Patient has no issues or complaints today. 11/10; patient presents for follow-up. She has been using Hydrofera Blue and Medihoney without issues. She has been wearing her Orthotics. It is unclear if she is offloading this area or not. 11/17; patient presents for follow-up. She has been using Hydrofera Blue and Medihoney to the foot wound without issues. Grafix was available today for placement and patient would like to proceed with this. 12/4; patient presents  for follow-up. Grafix was placed at last clinic visit. Her wound is healed. She has no issues or complaints today. She is reported no drainage from the previous wound site. 12/8; patient states that her podiatrist removed a callus on the previous  wound site to the lateral right foot and there is a wound present. She currently denies signs of infection. 12/15; patient presents for follow-up. We have been using Grafix to the wound bed. This was placed in standard fashion today. 12/21; patient presents for follow-up. We have been using Grafix to the wound bed. She completed her oral antibiotics. She has no issues or complaints today. 12/28; patient presents for follow-up. We have been using Grafix to the wound bed. She has reported no drainage. She is prepared for the total contact cast today. Her husband is present. 1/4; patient presents for follow-up. We use Grafix under the total contact cast at last clinic visit. Unfortunately her cast protector bag broke and the cast got wet in the shower. Her foot is macerated today. She denies signs of infection. 1/12; patient presents for follow-up. At last clinic visit her foot was macerated due to the total contact cast getting wet. This was not reapplied. She has been doing Medihoney and Hydrofera Blue to the wound bed. She is reported no drainage. Electronic Signature(s) Signed: 03/04/2022 11:36:12 AM By: Kalman Shan DO Entered By: Kalman Shan on 03/04/2022 10:44:24 -------------------------------------------------------------------------------- Physical Exam Details Patient Name: Date of Service: [name], [name] 03/04/2022 9:00 A M Medical Record Number: [id number] Patient Account Number: [id number] Date of Birth/Sex: Treating RN: 1956/01/10 (67 y.o. F) Primary Care Provider: [name] Other Clinician: Referring Provider: Treating Provider/Extender: [name] in Treatment: [address], [place] K ([id number]) [id number] Page 3 of 9 Constitutional respirations regular, non-labored and within target range for patient.. Cardiovascular 2+ dorsalis pedis/posterior tibialis pulses. Psychiatric pleasant and  cooperative. Notes T the lateral aspect of the right foot there is a callus to the previous wound site. No drainage noted. No tenderness to palpation. Appears well-healed. o Electronic Signature(s) Signed: 03/04/2022 11:36:12 AM By: Kalman Shan DO Entered By: Kalman Shan on 03/04/2022 10:45:00 -------------------------------------------------------------------------------- Physician Orders Details Patient Name: Date of Service: [name], [name] 03/04/2022 9:00 A M Medical Record Number: [id number] Patient Account Number: [id number] Date of Birth/Sex: Treating RN: [birth year] (67 y.o. [name], [name] Primary Care Provider: [name] Other Clinician: Referring Provider: Treating Provider/Extender: [name] in Treatment: 22 Verbal / Phone Orders: No Diagnosis Coding Follow-up Appointments ppointment in 1 week. - cancel appt. for next Friday 01/19 and make appt. for 03/18/22 Return A ppointment in 2 weeks. - w/ Dr. Heber Angie on 03/18/22 Return A Cellular or Tissue Based Products Cellular or Tissue Based Product Type: - Run IVR for grafix 12/30/21 order Grafix 01/07/2022 Grafix #4 applied 01/28/2022 Grafix #5 applied 02/03/22 Grafix # 6 applied 02/10/22 Grafix # 7 applied 02/17/22 Grafix # 8 applied Cellular or Tissue Based Product applied to wound bed, secured with steri-strips, cover with Adaptic or Mepitel. (DO NOT REMOVE). Bathing/ Shower/ Hygiene May shower with protection but do not get wound dressing(s) wet. Protect dressing(s) with water repellant cover (for example, large plastic bag) or a cast cover and may then take shower. Edema Control - Lymphedema / SCD / Other Avoid standing for long periods of time. Electronic Signature(s) Signed: 03/04/2022 11:36:12 AM By: Kalman Shan DO Entered By: Kalman Shan on 03/04/2022  10:45:06 --------------------------------------------------------------------------------  Problem List Details Patient Name: Date of Service: [name], [name] 03/04/2022 9:00 A M Medical Record Number: [id number] Patient Account Number: [id number] Date of Birth/Sex: Treating RN: 1956-02-20 (67 y.o. Horton Marshall, [place] K ([id number]) [id number] Page 4 of 9 Primary Care Provider: [name] Other Clinician: Referring Provider: Treating Provider/Extender: [name] in Treatment: 22 Active Problems ICD-10 Encounter Code Description Active Date MDM Diagnosis L97.512 Non-pressure chronic ulcer of other part of right foot with fat layer exposed 10/01/2021 No Yes E11.621 Type 2 diabetes mellitus with foot ulcer 10/01/2021 No Yes E11.42 Type 2 diabetes mellitus with diabetic polyneuropathy 10/01/2021 No Yes Inactive Problems Resolved Problems Electronic Signature(s) Signed: 03/04/2022 11:36:12 AM By: Kalman Shan DO Entered By: Kalman Shan on 03/04/2022 10:43:29 -------------------------------------------------------------------------------- Progress Note Details Patient Name: Date of Service: [name], [name] 03/04/2022 9:00 A M Medical Record Number: [id number] Patient Account Number: [id number] Date of Birth/Sex: Treating RN: [birth year] (67 y.o. F) Primary Care Provider: [name] Other Clinician: Referring Provider: Treating Provider/Extender: [name] in Treatment: 22 Subjective Chief Complaint Information obtained from Patient 10/27; Right Plantar foot wound 11/21; 2 small areas of skin breakdown to the right foot following cast placement 8/29; right plantar foot wound History of Present Illness (HPI) Admission 12/17/2020 Ms. Nalleli Largent is a 67 year old female with a past medical history of insulin-dependent type 2 diabetes, hypothyroidism and  daily1 pack per day cigarette smoker the presents to the clinic for a 6-week history of nonhealing wound to the right first MTPJ. She has been following with [name], podiatry for this issue. She has been using silver alginate with dressing changes. She uses a postsurgical shoe and offloading pads. She currently denies signs of infection. 10/31; patient presents for follow-up. She tolerated the soft cast fine although she states that she felt her foot rolling to one side. She denies signs of infection. She would like to do the total contact cast today. 12/23/2020 upon evaluation today patient appears to be doing excellent in regard to her wound on the foot and she is in a total contact cast. I do think this is appropriate this is the first cast change which we are obliged to do to ensure nothing is rubbing everything appears to be doing quite well and very pleased in that regard. 11/7; patient presents for follow-up. She had no issues with the cast. She denies signs of infection. 11/14; patient presents for 1 week follow-up. She has had no issues with the cast. She denies signs of infection. 11/21; patient presents for 1 week follow-up. She did develop 2 small areas of skin breakdown on either side of the right foot from the cast rubbing. She states she did not feel the wounds developing. She currently denies signs of infection. 11/28; patient presents for 1 week follow-up. She has no issues or complaints today. She denies pain or acute signs of infection. 12/12; patient presents for follow-up. She reports improvement to her right lateral foot wound. She has been using silver alginate to the area. She has no issues or complaints today. RITISHA, DEITRICK ([id number]) [id number] Page 5 of 9 12/19; patient presents for follow-up. She reports that her right lateral foot wound has healed. She has no issues or complaints today. 1/3; patient presents for follow-up. She has  no issues or complaints today. She reports no open wounds. Readmission 10/01/2021 [name] is a 67 year old female's with a past medical  history of insulin-dependent controlled type 2 diabetes complicated by peripheral neuropathy that presents to the clinic for a 47-monthhistory of nonhealing ulcer to the right foot. I have seen her before for a wound to the same area that was treated and healed. Her wound today however this is much deeper and it has thick yellow drainage. She is not sure exactly how it started. She noticed it 1 day. She has been following with Dr. EAmalia Haileyfor this issue. She had a wound culture that showed a mix of organisms on 09/01/2021. She was recently started on doxycycline. She is using the dConservation officer, nature She has been using Iodosorb with dressing changes. She currently denies systemic signs of infection. 8/21; patient presents for follow-up. She states she started and completed levofloxacin. She is still taking doxycycline. She reports improvement in drainage and odor. She has been doing Dakin's wet-to-dry dressings as well. She is using her front offloading shoe. She denies signs of infection. 8/29; patient presents for follow-up. She is still taking doxycycline. She continues to report improvement in drainage and reports no odor. She denies any purulent drainage. She has been doing silver alginate to the wound bed and using her front offloading shoe. 9/5; patient presents for follow-up. She completed the course of levofloxacin. She has someone that is able to drive her today so we can place the total contact cast. She reports no signs of infection. We have been doing silver alginate to the wound bed. 9/7; patient presents for follow-up. She has tolerated the total contact cast well. She presents for her obligatory cast change. She has no issues or complaints today. 9/11; patient presents for follow-up. She tolerated the total contact cast well. She has no issues or  complaints today. We discussed potentially doing a skin substitute and patient would like to see if her insurance will cover this. 9/18; patient presents for follow-up. She had no issues with the total contact cast. She been approved for Grafix and patient would like to have this placed today. 9/25; patient presents for follow-up. Grafix #1 was placed in standard fashion at last clinic visit. She has no issues or complaints today. She tolerated the cast well. 10/2; patient presents for follow-up. Grafix #2 was placed in standard fashion at last clinic visit. Unfortunately she developed a wound to the lateral aspect of the foot over the past week. She states she has been on her feet a lot more and walking more. She denies systemic signs of infection. 10/9; patient presents for follow-up. Grafix #3 was placed in standard at last clinic visit. The plantar foot wound has healed with this. She developed a new wound to the lateral aspect last week and this has gotten larger. She reports soreness to this area. She denies systemic signs of infection. 10/16; patient presents for follow-up. Her plantar wound continues to remain closed. Her lateral wound has improved with the use of Hydrofera Blue. She has no issues or complaints today. She denies signs of infection. She has almost completed her course of antibiotics prescribed at last clinic visit. 10/23; patient presents for follow-up. She has been using Hydrofera Blue and Medihoney to the wound bed. She has no issues or complaints today. She has been using her surgical shoe. 10/30; patient presents for follow-up. We have been using Hydrofera Blue and Medihoney to the wound bed. Patient has no issues or complaints today. 11/10; patient presents for follow-up. She has been using Hydrofera Blue and Medihoney without issues. She has been wearing  her Orthotics. It is unclear if she is offloading this area or not. 11/17; patient presents for follow-up. She has  been using Hydrofera Blue and Medihoney to the foot wound without issues. Grafix was available today for placement and patient would like to proceed with this. 12/4; patient presents for follow-up. Grafix was placed at last clinic visit. Her wound is healed. She has no issues or complaints today. She is reported no drainage from the previous wound site. 12/8; patient states that her podiatrist removed a callus on the previous wound site to the lateral right foot and there is a wound present. She currently denies signs of infection. 12/15; patient presents for follow-up. We have been using Grafix to the wound bed. This was placed in standard fashion today. 12/21; patient presents for follow-up. We have been using Grafix to the wound bed. She completed her oral antibiotics. She has no issues or complaints today. 12/28; patient presents for follow-up. We have been using Grafix to the wound bed. She has reported no drainage. She is prepared for the total contact cast today. Her husband is present. 1/4; patient presents for follow-up. We use Grafix under the total contact cast at last clinic visit. Unfortunately her cast protector bag broke and the cast got wet in the shower. Her foot is macerated today. She denies signs of infection. 1/12; patient presents for follow-up. At last clinic visit her foot was macerated due to the total contact cast getting wet. This was not reapplied. She has been doing Medihoney and Hydrofera Blue to the wound bed. She is reported no drainage. Patient History Information obtained from Patient, Chart. Family History Unknown History. Social History Current every day smoker - 1 pack/a/day, Alcohol Use - Never, Drug Use - No History, Caffeine Use - Moderate. Medical History Eyes Denies history of Cataracts, Glaucoma, Optic Neuritis Ear/Nose/Mouth/Throat Denies history of Chronic sinus problems/congestion, Middle ear problems Hematologic/Lymphatic Denies history of  Anemia, Hemophilia, Human Immunodeficiency Virus, Lymphedema, Sickle Cell Disease Respiratory Patient has history of Sleep Apnea Denies history of Aspiration, Asthma, Chronic Obstructive Pulmonary Disease (COPD), Pneumothorax, Tuberculosis Hamme, Sarahjane K ([id number]) [id number] Page 6 of 9 Cardiovascular Patient has history of Coronary Artery Disease, Hypertension Denies history of Angina, Arrhythmia, Congestive Heart Failure, Deep Vein Thrombosis, Hypotension, Myocardial Infarction, Peripheral Arterial Disease, Peripheral Venous Disease, Phlebitis, Vasculitis Gastrointestinal Denies history of Cirrhosis , Colitis, Crohnoos, Hepatitis A, Hepatitis B, Hepatitis C Endocrine Patient has history of Type II Diabetes Denies history of Type I Diabetes Genitourinary Denies history of End Stage Renal Disease Immunological Denies history of Lupus Erythematosus, Raynaudoos, Scleroderma Integumentary (Skin) Denies history of History of Burn Musculoskeletal Denies history of Gout, Rheumatoid Arthritis, Osteoarthritis, Osteomyelitis Neurologic Patient has history of Neuropathy Denies history of Dementia, Quadriplegia, Paraplegia, Seizure Disorder Hospitalization/Surgery History - 2nd and 3rd right toe amps. - total hysterectomy. - cholecystectomy. - 2 knee surgery's on left and 1 on right. Medical A Surgical History Notes nd Cardiovascular hypercholesterolemia Objective Constitutional respirations regular, non-labored and within target range for patient.. Vitals Time Taken: 9:20 AM, Respiratory Rate: 17 breaths/min. Cardiovascular 2+ dorsalis pedis/posterior tibialis pulses. Psychiatric pleasant and cooperative. General Notes: T the lateral aspect of the right foot there is a callus to the previous wound site. No drainage noted. No tenderness to palpation. Appears well- o healed. Integumentary (Hair, Skin) Wound #5R status is Healed - Epithelialized.  Original cause of wound was Blister. The date acquired was: 11/22/2021. The wound has been in treatment 14 weeks. The wound is located  on the Right,Lateral Foot. The wound measures 0cm length x 0cm width x 0cm depth; 0cm^2 area and 0cm^3 volume. There is Fat Layer (Subcutaneous Tissue) exposed. There is no tunneling or undermining noted. There is a small amount of serosanguineous drainage noted. The wound margin is distinct with the outline attached to the wound base. There is large (67-100%) red granulation within the wound bed. There is no necrotic tissue within the wound bed. The periwound skin appearance had no abnormalities noted for color. The periwound skin appearance exhibited: Callus, Maceration. The periwound skin appearance did not exhibit: Crepitus, Excoriation, Induration, Rash, Scarring, Dry/Scaly. Periwound temperature was noted as No Abnormality. The periwound has tenderness on palpation. Assessment Active Problems ICD-10 Non-pressure chronic ulcer of other part of right foot with fat layer exposed Type 2 diabetes mellitus with foot ulcer Type 2 diabetes mellitus with diabetic polyneuropathy Patient's wound has healed. There is a callus over the previous wound site. On palpation there was no drainage or fluctuance. The area appears well-healed however she has had calluses in the past that have re-opened with a wound. For now I recommended callus pads T help with offloading this area. I will see o her back in 2 weeks to assure that the area has not reopened. Plan Follow-up Appointments: TIPHANI, MELLS ([id number]) [phone].pdf Page 7 of 9 Return Appointment in 1 week. - cancel appt. for next Friday 01/19 and make appt. for 03/18/22 Return Appointment in 2 weeks. - w/ Dr. Heber [place] on 03/18/22 Cellular or Tissue Based Products: Cellular or Tissue Based Product Type: - Run IVR for grafix 12/30/21 order Grafix 01/07/2022 Grafix #4 applied 01/28/2022  Grafix #5 applied 02/03/22 Grafix # 6 applied 02/10/22 Grafix # 7 applied 02/17/22 Grafix # 8 applied Cellular or Tissue Based Product applied to wound bed, secured with steri-strips, cover with Adaptic or Mepitel. (DO NOT REMOVE). Bathing/ Shower/ Hygiene: May shower with protection but do not get wound dressing(s) wet. Protect dressing(s) with water repellant cover (for example, large plastic bag) or a cast cover and may then take shower. Edema Control - Lymphedema / SCD / Other: Avoid standing for long periods of time. 1. Callus pads daily 2. Follow-up in 2 weeks Electronic Signature(s) Signed: 03/04/2022 11:36:12 AM By: Kalman Shan DO Entered By: Kalman Shan on 03/04/2022 10:47:03 -------------------------------------------------------------------------------- HxROS Details Patient Name: Date of Service: [name], [name] 03/04/2022 9:00 A M Medical Record Number: [id number] Patient Account Number: [id number] Date of Birth/Sex: Treating RN: [birth year] (67 y.o. F) Primary Care Provider: [name] Other Clinician: Referring Provider: Treating Provider/Extender: [name] in Treatment: 22 Information Obtained From Patient Chart Eyes Medical History: Negative for: Cataracts; Glaucoma; Optic Neuritis Ear/Nose/Mouth/Throat Medical History: Negative for: Chronic sinus problems/congestion; Middle ear problems Hematologic/Lymphatic Medical History: Negative for: Anemia; Hemophilia; Human Immunodeficiency Virus; Lymphedema; Sickle Cell Disease Respiratory Medical History: Positive for: Sleep Apnea Negative for: Aspiration; Asthma; Chronic Obstructive Pulmonary Disease (COPD); Pneumothorax; Tuberculosis Cardiovascular Medical History: Positive for: Coronary Artery Disease; Hypertension Negative for: Angina; Arrhythmia; Congestive Heart Failure; Deep Vein Thrombosis; Hypotension; Myocardial Infarction; Peripheral Arterial  Disease; Peripheral Venous Disease; Phlebitis; Vasculitis Past Medical History Notes: hypercholesterolemia Gastrointestinal Medical History: Negative for: Cirrhosis ; Colitis; Crohns; Hepatitis A; Hepatitis B; Hepatitis C Endocrine [name], MASTRIANNI ([id number]) [id number] Page 8 of 9 Medical History: Positive for: Type II Diabetes Negative for: Type I Diabetes Genitourinary Medical History: Negative for: End Stage Renal Disease Immunological Medical History: Negative for: Lupus Erythematosus; Raynauds; Scleroderma Integumentary (  Skin) Medical History: Negative for: History of Burn Musculoskeletal Medical History: Negative for: Gout; Rheumatoid Arthritis; Osteoarthritis; Osteomyelitis Neurologic Medical History: Positive for: Neuropathy Negative for: Dementia; Quadriplegia; Paraplegia; Seizure Disorder Immunizations Pneumococcal Vaccine: Received Pneumococcal Vaccination: Yes Received Pneumococcal Vaccination On or After 60th Birthday: Yes Tetanus Vaccine: Last tetanus shot: 12/17/2020 Implantable Devices No devices added Hospitalization / Surgery History Type of Hospitalization/Surgery 2nd and 3rd right toe amps total hysterectomy cholecystectomy 2 knee surgery's on left and 1 on right Family and Social History Unknown History: Yes; Current every day smoker - 1 pack/a/day; Alcohol Use: Never; Drug Use: No History; Caffeine Use: Moderate; Financial Concerns: No; Food, Clothing or Shelter Needs: No; Support System Lacking: No; Transportation Concerns: No Electronic Signature(s) Signed: 03/04/2022 11:36:12 AM By: Kalman Shan DO Entered By: Kalman Shan on 03/04/2022 10:44:30 -------------------------------------------------------------------------------- SuperBill Details Patient Name: Date of Service: [name], [name] 03/04/2022 Medical Record Number: [id number] Patient Account Number: [id number] Date of Birth/Sex:  Treating RN: 09-19-1955 (67 y.o. [name], [name] Primary Care Provider: [name] Other Clinician: Referring Provider: Treating Provider/Extender: [name] in Treatment: [address] Virl Diamond ([id number]) [id number] Page 9 of 9 ICD-10 Codes Code Description [phone] Non-pressure chronic ulcer of other part of right foot with fat layer exposed E11.621 Type 2 diabetes mellitus with foot ulcer E11.42 Type 2 diabetes mellitus with diabetic polyneuropathy Facility Procedures : CPT4 Code: [id number] Description: 99213 - WOUND CARE VISIT-LEV 3 EST PT Modifier: Quantity: 1 Physician Procedures : CPT4 Code Description Modifier [id number] - WC PHYS LEVEL 3 - EST PT ICD-10 Diagnosis Description L97.512 Non-pressure chronic ulcer of other part of right foot with fat layer exposed E11.621 Type 2 diabetes mellitus with foot ulcer E11.42 Type 2  diabetes mellitus with diabetic polyneuropathy Quantity: 1 Electronic Signature(s) Signed: 03/04/2022 11:36:12 AM By: Kalman Shan DO Entered By: Kalman Shan on 03/04/2022 10:47:13

## 2022-03-09 ENCOUNTER — Ambulatory Visit (INDEPENDENT_AMBULATORY_CARE_PROVIDER_SITE_OTHER): Payer: Medicare Other | Admitting: Licensed Clinical Social Worker

## 2022-03-09 DIAGNOSIS — F331 Major depressive disorder, recurrent, moderate: Secondary | ICD-10-CM | POA: Diagnosis not present

## 2022-03-09 NOTE — Progress Notes (Signed)
  THERAPIST PROGRESS NOTE  Session Time: 910-10a  Nuevo in office visit for patient and LCSW clinician  Participation Level: Active  Behavioral Response: NAAlertDepressed  Type of Therapy: Individual Therapy  Treatment Goals addressed: Develop healthy thinking patterns and beliefs about self, others, and the world that lead to the alleviation and help prevent the relapse of depression per self report 3 out of 5 sessions documented    ProgressTowards Goals: Progressing  Interventions: CBT, Supportive, and Reframing  Summary: [name] is a 68 y.o. female who presents with improving symptoms associated with depression diagnosis. Pt is working hard to maintain current levels of functioning.   Allowed pt to explore and express thoughts and feelings associated with recent life situations and external stressors. Pt reports that she is continuing to experience stress associated with the relationship with her husband. Allowed pt to identify specific incidents that have happened in the recent past and the overall psychological impact. Pt states that she often will get angry when her husband "just doesn't know what to do to help me around the house".   Discussed pts lack of motivation when it comes to cleaning around the house. Pt states she just feels overwhelmed and often doesn't know where to begin. Developed a task list and allowed pt to prioritize tasks that she feels are most important and discussed steps that pt could take to get to these goals.   Pt feels confident that she will be able to continue organizing and breaking down projects in the home.  Discussed relationships with friends--pt is being intentional about spending time with older adults (helping older) and with her best friends. Encouraged pt to continue engaging in positive social events.   Allowed pt to explore and express thoughts and feelings associated with recent life situations  and external stressors.Continued recommendations are as follows: self care behaviors, positive social engagements, focusing on overall work/home/life balance, and focusing on positive physical and emotional wellness.   Suicidal/Homicidal: No  Therapist Response: Pt is continuing to apply interventions learned in session into daily life situations. Pt is currently on track to meet goals utilizing interventions mentioned above. Personal growth and progress noted. Treatment to continue as indicated.   Plan: Return again in 4 weeks.  Diagnosis:  Encounter Diagnosis  Name Primary?   MDD (major depressive disorder), recurrent episode, moderate ([place]) Yes   Collaboration of Care: Other pt to continue follow ups with PCP Dr. F[facility]  Patient/Guardian was advised Release of Information must be obtained prior to any record release in order to collaborate their care with an outside provider. Patient/Guardian was advised if they have not already done so to contact the registration department to sign all necessary forms in order for [place] to release information regarding their care.   Consent: Patient/Guardian gives verbal consent for treatment and assignment of benefits for services provided during this visit. Patient/Guardian expressed understanding and agreed to proceed.   [place], LCSW 03/09/2022

## 2022-03-11 ENCOUNTER — Encounter (HOSPITAL_BASED_OUTPATIENT_CLINIC_OR_DEPARTMENT_OTHER): Payer: Medicare Other | Admitting: Internal Medicine

## 2022-03-11 DIAGNOSIS — L97512 Non-pressure chronic ulcer of other part of right foot with fat layer exposed: Secondary | ICD-10-CM | POA: Diagnosis not present

## 2022-03-11 DIAGNOSIS — Z794 Long term (current) use of insulin: Secondary | ICD-10-CM | POA: Diagnosis not present

## 2022-03-11 DIAGNOSIS — F17210 Nicotine dependence, cigarettes, uncomplicated: Secondary | ICD-10-CM | POA: Diagnosis not present

## 2022-03-11 DIAGNOSIS — E1142 Type 2 diabetes mellitus with diabetic polyneuropathy: Secondary | ICD-10-CM

## 2022-03-11 DIAGNOSIS — E11621 Type 2 diabetes mellitus with foot ulcer: Secondary | ICD-10-CM | POA: Diagnosis not present

## 2022-03-11 NOTE — Progress Notes (Signed)
[name], [name] ([id number]) [id number] Page 1 of 5 Visit Report for 03/11/2022 Arrival Information Details Patient Name: Date of Service: [name] 03/11/2022 9:30 [name] M Medical Record Number: [id number] Patient Account Number: [id number] Date of Birth/Sex: Treating RN: Mar 28, 1955 (67 y.o. [name], [name] Primary Care Marlaya Turck: [name] Other Clinician: Referring Adolf Ormiston: Treating Latarshia Jersey/Extender: [name] in Treatment: 23 Visit Information History Since Last Visit Added or deleted any medications: No Patient Arrived: Ambulatory Any new allergies or adverse reactions: No Arrival Time: 09:30 Had [name] fall or experienced change in No Accompanied By: self activities of daily living that may affect Transfer Assistance: None risk of falls: Patient Identification Verified: Yes Signs or symptoms of abuse/neglect since last visito No Secondary Verification Process Completed: Yes Hospitalized since last visit: No Patient Requires Transmission-Based Precautions: No Implantable device outside of the clinic excluding No Patient Has Alerts: No cellular tissue based products placed in the center since last visit: Has Dressing in Place as Prescribed: Yes Pain Present Now: No Electronic Signature(s) Signed: 03/11/2022 5:13:02 PM By: Deon Pilling RN, BSN Entered By: Deon Pilling on 03/11/2022 09:34:48 -------------------------------------------------------------------------------- Clinic Level of Care Assessment Details Patient Name: Date of Service: [name] 03/11/2022 9:30 [name] M Medical Record Number: [id number] Patient Account Number: [id number] Date of Birth/Sex: Treating RN: 1955/09/23 (67 y.o. [name], [name] Primary Care Diarra Ceja: [name] Other Clinician: Referring Quina Wilbourne: Treating Daleon Willinger/Extender: [name] in Treatment: 23 Clinic Level  of Care Assessment Items TOOL 4 Quantity Score X- 1 0 Use when only an EandM is performed on FOLLOW-UP visit ASSESSMENTS - Nursing Assessment / Reassessment X- 1 10 Reassessment of Co-morbidities (includes updates in patient status) X- 1 5 Reassessment of Adherence to Treatment Plan ASSESSMENTS - Wound and Skin [name] ssessment / Reassessment X - Simple Wound Assessment / Reassessment - one wound 1 5 '[]'$  - 0 Complex Wound Assessment / Reassessment - multiple wounds X- 1 10 Dermatologic / Skin Assessment (not related to wound area) ASSESSMENTS - Focused Assessment X- 1 5 Circumferential Edema Measurements - multi extremities '[]'$  - 0 Nutritional Assessment / Counseling / Intervention [name], [name] ([id number]) [phone].pdf Page 2 of 5 '[]'$  - 0 Lower Extremity Assessment (monofilament, tuning fork, pulses) '[]'$  - 0 Peripheral Arterial Disease Assessment (using hand held doppler) ASSESSMENTS - Ostomy and/or Continence Assessment and Care '[]'$  - 0 Incontinence Assessment and Management '[]'$  - 0 Ostomy Care Assessment and Management (repouching, etc.) PROCESS - Coordination of Care X - Simple Patient / Family Education for ongoing care 1 15 '[]'$  - 0 Complex (extensive) Patient / Family Education for ongoing care X- 1 10 Staff obtains Programmer, systems, Records, T Results / Process Orders est '[]'$  - 0 Staff telephones HHA, Nursing Homes / Clarify orders / etc '[]'$  - 0 Routine Transfer to another Facility (non-emergent condition) '[]'$  - 0 Routine Hospital Admission (non-emergent condition) '[]'$  - 0 New Admissions / Biomedical engineer / Ordering NPWT Apligraf, etc. , '[]'$  - 0 Emergency Hospital Admission (emergent condition) X- 1 10 Simple Discharge Coordination '[]'$  - 0 Complex (extensive) Discharge Coordination PROCESS - Special Needs '[]'$  - 0 Pediatric / Minor Patient Management '[]'$  - 0 Isolation Patient Management '[]'$  - 0 Hearing / Language / Visual special needs '[]'$  -  0 Assessment of Community assistance (transportation, D/C planning, etc.) '[]'$  - 0 Additional assistance / Altered mentation '[]'$  - 0 Support Surface(s) Assessment (bed, cushion, seat, etc.) INTERVENTIONS -  Wound Cleansing / Measurement X - Simple Wound Cleansing - one wound 1 5 '[]'$  - 0 Complex Wound Cleansing - multiple wounds X- 1 5 Wound Imaging (photographs - any number of wounds) '[]'$  - 0 Wound Tracing (instead of photographs) X- 1 5 Simple Wound Measurement - one wound '[]'$  - 0 Complex Wound Measurement - multiple wounds INTERVENTIONS - Wound Dressings '[]'$  - 0 Small Wound Dressing one or multiple wounds '[]'$  - 0 Medium Wound Dressing one or multiple wounds '[]'$  - 0 Large Wound Dressing one or multiple wounds '[]'$  - 0 Application of Medications - topical '[]'$  - 0 Application of Medications - injection INTERVENTIONS - Miscellaneous '[]'$  - 0 External ear exam '[]'$  - 0 Specimen Collection (cultures, biopsies, blood, body fluids, etc.) '[]'$  - 0 Specimen(s) / Culture(s) sent or taken to Lab for analysis '[]'$  - 0 Patient Transfer (multiple staff / Civil Service fast streamer / Similar devices) '[]'$  - 0 Simple Staple / Suture removal (25 or less) '[]'$  - 0 Complex Staple / Suture removal (26 or more) '[]'$  - 0 Hypo / Hyperglycemic Management (close monitor of Blood Glucose) [name], [name] ([id number]) [id number] Page 3 of 5 '[]'$  - 0 Ankle / Brachial Index (ABI) - do not check if billed separately X- 1 5 Vital Signs Has the patient been seen at the hospital within the last three years: Yes Total Score: 90 Level Of Care: New/Established - Level 3 Electronic Signature(s) Signed: 03/11/2022 5:13:02 PM By: Deon Pilling RN, BSN Entered By: Deon Pilling on 03/11/2022 09:38:21 -------------------------------------------------------------------------------- Encounter Discharge Information Details Patient Name: Date of Service: [name] 03/11/2022 9:30 [name] M Medical Record  Number: [id number] Patient Account Number: [id number] Date of Birth/Sex: Treating RN: 1956-01-15 (67 y.o. [name], [name] Primary Care Chalmers Iddings: [name] Other Clinician: Referring Danaiya Steadman: Treating Nakima Fluegge/Extender: [name] in Treatment: 23 Encounter Discharge Information Items Discharge Condition: Stable Ambulatory Status: Ambulatory Discharge Destination: Home Transportation: Private Auto Accompanied By: self Schedule Follow-up Appointment: No Clinical Summary of Care: Electronic Signature(s) Signed: 03/11/2022 5:13:02 PM By: Deon Pilling RN, BSN Entered By: Deon Pilling on 03/11/2022 09:38:41 -------------------------------------------------------------------------------- Lower Extremity Assessment Details Patient Name: Date of Service: [name] 03/11/2022 9:30 [name] M Medical Record Number: [id number] Patient Account Number: [id number] Date of Birth/Sex: Treating RN: February 20, 1956 (67 y.o. [name], [name] Primary Care Eliezer Khawaja: [name] Other Clinician: Referring Raffi Milstein: Treating Sunita Demond/Extender: [name] in Treatment: 23 Edema Assessment Assessed: [Left: No] [Right: Yes] Edema: [Left: N] [Right: o] Calf Left: Right: Point of Measurement: From Medial Instep 44 cm Ankle Left: Right: Point of Measurement: From Medial Instep 23 cm Vascular Assessment Lister, [name] ([id number]) [Right:[id number] Page 4 of 5] Pulses: Dorsalis Pedis Palpable: [Right:Yes] Electronic Signature(s) Signed: 03/11/2022 5:13:02 PM By: Deon Pilling RN, BSN Entered By: Deon Pilling on 03/11/2022 09:35:14 -------------------------------------------------------------------------------- Multi Wound Chart Details Patient Name: Date of Service: [place], [name] 03/11/2022 9:30 [name] M Medical Record Number: [id number] Patient Account Number: [id number] Date of  Birth/Sex: Treating RN: [birth year] (67 y.o. F) Primary Care Halee Glynn: [name] Other Clinician: Referring Arrayah Connors: Treating Fatma Rutten/Extender: [name] in Treatment: 23 Vital Signs Height(in): Capillary Blood Glucose(mg/dl): 139 Weight(lbs): Pulse(bpm): 92 Body Mass Index(BMI): Blood Pressure(mmHg): 121/72 Temperature(F): 97.5 Respiratory Rate(breaths/min): 20 [Treatment Notes:Wound Assessments Treatment Notes] Electronic Signature(s) Signed: 03/11/2022 11:32:06 AM By: Kalman Shan DO Entered By: Kalman Shan on 03/11/2022 09:58:33 -------------------------------------------------------------------------------- Pain Assessment  Details Patient Name: Date of Service: [place] [name]. 03/11/2022 9:30 [name] M Medical Record Number: [id number] Patient Account Number: [id number] Date of Birth/Sex: Treating RN: [birth year] (67 y.o. [name] Primary Care Nixon Kolton: [name] Other Clinician: Referring Dujuan Stankowski: Treating Mauro Arps/Extender: [name] in Treatment: 23 Active Problems Location of Pain Severity and Description of Pain Patient Has Paino No Site Locations Rate the pain. [name], [name] ([id number]) [id number] Page 5 of 5 Rate the pain. Current Pain Level: 0 Pain Management and Medication Current Pain Management: Medication: No Cold Application: No Rest: No Massage: No Activity: No T.E.N.S.: No Heat Application: No Leg drop or elevation: No Is the Current Pain Management Adequate: Adequate How does your wound impact your activities of daily livingo Sleep: No Bathing: No Appetite: No Relationship With Others: No Bladder Continence: No Emotions: No Bowel Continence: No Work: No Toileting: No Drive: No Dressing: No Hobbies: No Electronic Signature(s) Signed: 03/11/2022 5:13:02 PM By: Deon Pilling RN, BSN Entered By: Deon Pilling on 03/11/2022 09:35:03 -------------------------------------------------------------------------------- Vitals Details Patient Name: Date of Service: [place], [name] 03/11/2022 9:30 [name] M Medical Record Number: [id number] Patient Account Number: [id number] Date of Birth/Sex: Treating RN: [birth year] (67 y.o. [name], [name] Primary Care Akeel Reffner: [name] Other Clinician: Referring Sabino Denning: Treating Nayvie Lips/Extender: [name] in Treatment: 23 Vital Signs Time Taken: 09:34 Temperature (F): 97.5 Pulse (bpm): 92 Respiratory Rate (breaths/min): 20 Blood Pressure (mmHg): 121/72 Capillary Blood Glucose (mg/dl): 139 Reference Range: 80 - 120 mg / dl Electronic Signature(s) Signed: 03/11/2022 5:13:02 PM By: Deon Pilling RN, BSN Entered By: Deon Pilling on 03/11/2022 09:36:21

## 2022-03-11 NOTE — Progress Notes (Signed)
CHAVIE, KOLINSKI ([id number]) [id number] Page 1 of 9 Visit Report for 03/11/2022 Chief Complaint Document Details Patient Name: Date of Service: [name], [name]. 03/11/2022 9:30 [name] M Medical Record Number: [id number] Patient Account Number: [id number] Date of Birth/Sex: Treating RN: 1956/01/28 (67 y.o. F) Primary Care Provider: [name] Other Clinician: Referring Provider: Treating Provider/Extender: [name] in Treatment: 23 Information Obtained from: Patient Chief Complaint 10/27; Right Plantar foot wound 11/21; 2 small areas of skin breakdown to the right foot following cast placement 8/29; right plantar foot wound Electronic Signature(s) Signed: 03/11/2022 11:32:06 AM By: Kalman Shan DO Entered By: Kalman Shan on 03/11/2022 09:58:39 -------------------------------------------------------------------------------- HPI Details Patient Name: Date of Service: [name], [name]. 03/11/2022 9:30 [name] M Medical Record Number: [id number] Patient Account Number: [id number] Date of Birth/Sex: Treating RN: Aug 29, 1955 (67 y.o. F) Primary Care Provider: [name] Other Clinician: Referring Provider: Treating Provider/Extender: [name] in Treatment: 23 History of Present Illness HPI Description: Admission 12/17/2020 [name] with [name] past medical history of insulin-dependent type 2 diabetes, hypothyroidism and daily1 pack per day cigarette smoker the presents to the clinic for [name] 6-week history of nonhealing wound to the right first MTPJ. She has been following with [name], podiatry for this issue. She has been using silver alginate with dressing changes. She uses [name] postsurgical [name] and offloading pads. She currently denies signs of infection. 10/31; patient presents for follow-up. She tolerated the soft cast fine although  she states that she felt her foot rolling to one side. She denies signs of infection. She would like to do the total contact cast today. 12/23/2020 upon evaluation today patient appears to be doing excellent in regard to her wound on the foot and she is in [name] total contact cast. I do think this is appropriate this is the first cast change which we are obliged to do to ensure nothing is rubbing everything appears to be doing quite well and very pleased in that regard. 11/7; patient presents for follow-up. She had no issues with the cast. She denies signs of infection. 11/14; patient presents for 1 week follow-up. She has had no issues with the cast. She denies signs of infection. 11/21; patient presents for 1 week follow-up. She did develop 2 small areas of skin breakdown on either side of the right foot from the cast rubbing. She states she did not feel the wounds developing. She currently denies signs of infection. 11/28; patient presents for 1 week follow-up. She has no issues or complaints today. She denies pain or acute signs of infection. 12/12; patient presents for follow-up. She reports improvement to her right lateral foot wound. She has been using silver alginate to the area. She has no issues or complaints today. 12/19; patient presents for follow-up. She reports that her right lateral foot wound has healed. She has no issues or complaints today. 1/3; patient presents for follow-up. She has no issues or complaints today. She reports no open wounds. NEL, STONEKING ([id number]) [id number] Page 2 of 9 Readmission 10/01/2021 [name]'s with [name] past medical history of insulin-dependent controlled type 2 diabetes complicated by peripheral neuropathy that presents to the clinic for [name] 44-monthhistory of nonhealing ulcer to the right foot. I have seen her before for [name] wound to the same area that was treated and healed. Her wound today  however this  is much deeper and it has thick yellow drainage. She is not sure exactly how it started. She noticed it 1 day. She has been following with [name] for this issue. She had [name] wound culture that showed [name] mix of organisms on 09/01/2021. She was recently started on doxycycline. She is using the Conservation officer, nature. She has been using Iodosorb with dressing changes. She currently denies systemic signs of infection. 8/21; patient presents for follow-up. She states she started and completed levofloxacin. She is still taking doxycycline. She reports improvement in drainage and odor. She has been doing Dakin's wet-to-dry dressings as well. She is using her front offloading [name]. She denies signs of infection. 8/29; patient presents for follow-up. She is still taking doxycycline. She continues to report improvement in drainage and reports no odor. She denies any purulent drainage. She has been doing silver alginate to the wound bed and using her front offloading [name]. 9/5; patient presents for follow-up. She completed the course of levofloxacin. She has someone that is able to drive her today so we can place the total contact cast. She reports no signs of infection. We have been doing silver alginate to the wound bed. 9/7; patient presents for follow-up. She has tolerated the total contact cast well. She presents for her obligatory cast change. She has no issues or complaints today. 9/11; patient presents for follow-up. She tolerated the total contact cast well. She has no issues or complaints today. We discussed potentially doing [name] skin substitute and patient would like to see if her insurance will cover this. 9/18; patient presents for follow-up. She had no issues with the total contact cast. She been approved for Grafix and patient would like to have this placed today. 9/25; patient presents for follow-up. Grafix #1 was placed in standard fashion at last clinic visit. She has no issues or complaints  today. She tolerated the cast well. 10/2; patient presents for follow-up. Grafix #2 was placed in standard fashion at last clinic visit. Unfortunately she developed [name] wound to the lateral aspect of the foot over the past week. She states she has been on her feet [name] lot more and walking more. She denies systemic signs of infection. 10/9; patient presents for follow-up. Grafix #3 was placed in standard at last clinic visit. The plantar foot wound has healed with this. She developed [name] new wound to the lateral aspect last week and this has gotten larger. She reports soreness to this area. She denies systemic signs of infection. 10/16; patient presents for follow-up. Her plantar wound continues to remain closed. Her lateral wound has improved with the use of Hydrofera Blue. She has no issues or complaints today. She denies signs of infection. She has almost completed her course of antibiotics prescribed at last clinic visit. 10/23; patient presents for follow-up. She has been using Hydrofera Blue and Medihoney to the wound bed. She has no issues or complaints today. She has been using her surgical [name]. 10/30; patient presents for follow-up. We have been using Hydrofera Blue and Medihoney to the wound bed. Patient has no issues or complaints today. 11/10; patient presents for follow-up. She has been using Hydrofera Blue and Medihoney without issues. She has been wearing her Orthotics. It is unclear if she is offloading this area or not. 11/17; patient presents for follow-up. She has been using Hydrofera Blue and Medihoney to the foot wound without issues. Grafix was available today for placement and patient would like to proceed with this. 12/4; patient presents  for follow-up. Grafix was placed at last clinic visit. Her wound is healed. She has no issues or complaints today. She is reported no drainage from the previous wound site. 12/8; patient states that her podiatrist removed [name] callus on the previous  wound site to the lateral right foot and there is [name] wound present. She currently denies signs of infection. 12/15; patient presents for follow-up. We have been using Grafix to the wound bed. This was placed in standard fashion today. 12/21; patient presents for follow-up. We have been using Grafix to the wound bed. She completed her oral antibiotics. She has no issues or complaints today. 12/28; patient presents for follow-up. We have been using Grafix to the wound bed. She has reported no drainage. She is prepared for the total contact cast today. Her husband is present. 1/4; patient presents for follow-up. We use Grafix under the total contact cast at last clinic visit. Unfortunately her cast protector bag broke and the cast got wet in the shower. Her foot is macerated today. She denies signs of infection. 1/12; patient presents for follow-up. At last clinic visit her foot was macerated due to the total contact cast getting wet. This was not reapplied. She has been doing Medihoney and Hydrofera Blue to the wound bed. She is reported no drainage. 1/19; patient presents for follow-up. Her wound remains closed. She has noted no drainage. Electronic Signature(s) Signed: 03/11/2022 11:32:06 AM By: Kalman Shan DO Entered By: Kalman Shan on 03/11/2022 09:59:08 -------------------------------------------------------------------------------- Physical Exam Details Patient Name: Date of Service: [name], [name]. 03/11/2022 9:30 [name] M Medical Record Number: [id number] Patient Account Number: [id number] Date of Birth/Sex: Treating RN: 02-Apr-1955 (67 y.o. F) Primary Care Provider: [name] Other Clinician: Harold Hedge ([id number]) [id number] Page 3 of 9 Referring Provider: Treating Provider/Extender: [name] in Treatment: 23 Constitutional respirations regular, non-labored and within target range for  patient.. Cardiovascular 2+ dorsalis pedis/posterior tibialis pulses. Psychiatric pleasant and cooperative. Notes T the lateral aspect of the right foot there is [name] callus to the previous wound site. No drainage noted. No tenderness to palpation. No fluctuance on palpation. o Appears well-healed. Electronic Signature(s) Signed: 03/11/2022 11:32:06 AM By: Kalman Shan DO Entered By: Kalman Shan on 03/11/2022 09:59:46 -------------------------------------------------------------------------------- Physician Orders Details Patient Name: Date of Service: [name], [name]. 03/11/2022 9:30 [name] M Medical Record Number: [id number] Patient Account Number: [id number] Date of Birth/Sex: Treating RN: 1956/02/11 (67 y.o. [name], [name] Primary Care Provider: [name] Other Clinician: Referring Provider: Treating Provider/Extender: [name] in Treatment: 24 Verbal / Phone Orders: No Diagnosis Coding ICD-10 Coding Code Description L97.512 Non-pressure chronic ulcer of other part of right foot with fat layer exposed E11.621 Type 2 diabetes mellitus with foot ulcer E11.42 Type 2 diabetes mellitus with diabetic polyneuropathy Discharge From [facility] Services Discharge from [place] Signature(s) Signed: 03/11/2022 11:32:06 AM By: Kalman Shan DO Entered By: Kalman Shan on 03/11/2022 09:59:53 -------------------------------------------------------------------------------- Problem List Details Patient Name: Date of Service: [name], [name]. 03/11/2022 9:30 [name] M Medical Record Number: [id number] Patient Account Number: [id number] Date of Birth/Sex: Treating RN: 1956/02/05 (67 y.o. [name], [name] Primary Care Provider: [name] Other Clinician: Referring Provider: Treating Provider/Extender: [name] in Treatment: [address], Virl Diamond ([id number])  [id number] Page 4 of 9 ICD-10 Encounter Code Description Active Date MDM Diagnosis L97.512 Non-pressure chronic ulcer  of other part of right foot with fat layer exposed 10/01/2021 No Yes E11.621 Type 2 diabetes mellitus with foot ulcer 10/01/2021 No Yes E11.42 Type 2 diabetes mellitus with diabetic polyneuropathy 10/01/2021 No Yes Inactive Problems Resolved Problems Electronic Signature(s) Signed: 03/11/2022 11:32:06 AM By: Kalman Shan DO Entered By: Kalman Shan on 03/11/2022 09:58:29 -------------------------------------------------------------------------------- Progress Note Details Patient Name: Date of Service: Gantt, [name]. 03/11/2022 9:30 [name] M Medical Record Number: [id number] Patient Account Number: [id number] Date of Birth/Sex: Treating RN: December 17, 1955 (67 y.o. F) Primary Care Provider: [name] Other Clinician: Referring Provider: Treating Provider/Extender: [name] in Treatment: 23 Subjective Chief Complaint Information obtained from Patient 10/27; Right Plantar foot wound 11/21; 2 small areas of skin breakdown to the right foot following cast placement 8/29; right plantar foot wound History of Present Illness (HPI) Admission 12/17/2020 Ms. Jaziah Kwasnik is [name] 67 year old female with [name] past medical history of insulin-dependent type 2 diabetes, hypothyroidism and daily1 pack per day cigarette smoker the presents to the clinic for [name] 6-week history of nonhealing wound to the right first MTPJ. She has been following with [name], podiatry for this issue. She has been using silver alginate with dressing changes. She uses [name] postsurgical [name] and offloading pads. She currently denies signs of infection. 10/31; patient presents for follow-up. She tolerated the soft cast fine although she states that she felt her foot rolling to one side. She denies signs of infection. She would like to do  the total contact cast today. 12/23/2020 upon evaluation today patient appears to be doing excellent in regard to her wound on the foot and she is in [name] total contact cast. I do think this is appropriate this is the first cast change which we are obliged to do to ensure nothing is rubbing everything appears to be doing quite well and very pleased in that regard. 11/7; patient presents for follow-up. She had no issues with the cast. She denies signs of infection. 11/14; patient presents for 1 week follow-up. She has had no issues with the cast. She denies signs of infection. 11/21; patient presents for 1 week follow-up. She did develop 2 small areas of skin breakdown on either side of the right foot from the cast rubbing. She states she did not feel the wounds developing. She currently denies signs of infection. 11/28; patient presents for 1 week follow-up. She has no issues or complaints today. She denies pain or acute signs of infection. 12/12; patient presents for follow-up. She reports improvement to her right lateral foot wound. She has been using silver alginate to the area. She has no issues or complaints today. 12/19; patient presents for follow-up. She reports that her right lateral foot wound has healed. She has no issues or complaints today. 1/3; patient presents for follow-up. She has no issues or complaints today. She reports no open wounds. Readmission 10/01/2021 [name]'s with [name] past medical history of insulin-dependent controlled type 2 diabetes complicated by peripheral neuropathy that presents to the clinic for [name] 68-monthhistory of nonhealing ulcer to the right foot. I have seen her before for [name] wound to the same area that was treated and MPADEN, KURASK ([id number] Page 5 of 9 healed. Her wound today however this is much deeper and it has thick yellow drainage. She is not sure exactly how it started. She  noticed it 1 day. She has been following with Dr. EAmalia Haileyfor  this issue. She had [name] wound culture that showed [name] mix of organisms on 09/01/2021. She was recently started on doxycycline. She is using the Conservation officer, nature. She has been using Iodosorb with dressing changes. She currently denies systemic signs of infection. 8/21; patient presents for follow-up. She states she started and completed levofloxacin. She is still taking doxycycline. She reports improvement in drainage and odor. She has been doing Dakin's wet-to-dry dressings as well. She is using her front offloading [name]. She denies signs of infection. 8/29; patient presents for follow-up. She is still taking doxycycline. She continues to report improvement in drainage and reports no odor. She denies any purulent drainage. She has been doing silver alginate to the wound bed and using her front offloading [name]. 9/5; patient presents for follow-up. She completed the course of levofloxacin. She has someone that is able to drive her today so we can place the total contact cast. She reports no signs of infection. We have been doing silver alginate to the wound bed. 9/7; patient presents for follow-up. She has tolerated the total contact cast well. She presents for her obligatory cast change. She has no issues or complaints today. 9/11; patient presents for follow-up. She tolerated the total contact cast well. She has no issues or complaints today. We discussed potentially doing [name] skin substitute and patient would like to see if her insurance will cover this. 9/18; patient presents for follow-up. She had no issues with the total contact cast. She been approved for Grafix and patient would like to have this placed today. 9/25; patient presents for follow-up. Grafix #1 was placed in standard fashion at last clinic visit. She has no issues or complaints today. She tolerated the cast well. 10/2; patient presents for follow-up. Grafix #2 was placed in  standard fashion at last clinic visit. Unfortunately she developed [name] wound to the lateral aspect of the foot over the past week. She states she has been on her feet [name] lot more and walking more. She denies systemic signs of infection. 10/9; patient presents for follow-up. Grafix #3 was placed in standard at last clinic visit. The plantar foot wound has healed with this. She developed [name] new wound to the lateral aspect last week and this has gotten larger. She reports soreness to this area. She denies systemic signs of infection. 10/16; patient presents for follow-up. Her plantar wound continues to remain closed. Her lateral wound has improved with the use of Hydrofera Blue. She has no issues or complaints today. She denies signs of infection. She has almost completed her course of antibiotics prescribed at last clinic visit. 10/23; patient presents for follow-up. She has been using Hydrofera Blue and Medihoney to the wound bed. She has no issues or complaints today. She has been using her surgical [name]. 10/30; patient presents for follow-up. We have been using Hydrofera Blue and Medihoney to the wound bed. Patient has no issues or complaints today. 11/10; patient presents for follow-up. She has been using Hydrofera Blue and Medihoney without issues. She has been wearing her Orthotics. It is unclear if she is offloading this area or not. 11/17; patient presents for follow-up. She has been using Hydrofera Blue and Medihoney to the foot wound without issues. Grafix was available today for placement and patient would like to proceed with this. 12/4; patient presents for follow-up. Grafix was placed at last clinic visit. Her wound is healed. She has no issues or complaints today. She is reported no drainage from the previous wound site.  12/8; patient states that her podiatrist removed [name] callus on the previous wound site to the lateral right foot and there is [name] wound present. She currently denies signs of  infection. 12/15; patient presents for follow-up. We have been using Grafix to the wound bed. This was placed in standard fashion today. 12/21; patient presents for follow-up. We have been using Grafix to the wound bed. She completed her oral antibiotics. She has no issues or complaints today. 12/28; patient presents for follow-up. We have been using Grafix to the wound bed. She has reported no drainage. She is prepared for the total contact cast today. Her husband is present. 1/4; patient presents for follow-up. We use Grafix under the total contact cast at last clinic visit. Unfortunately her cast protector bag broke and the cast got wet in the shower. Her foot is macerated today. She denies signs of infection. 1/12; patient presents for follow-up. At last clinic visit her foot was macerated due to the total contact cast getting wet. This was not reapplied. She has been doing Medihoney and Hydrofera Blue to the wound bed. She is reported no drainage. 1/19; patient presents for follow-up. Her wound remains closed. She has noted no drainage. Patient History Information obtained from Patient, Chart. Family History Unknown History. Social History Current every day smoker - 1 pack/[name]/day, Alcohol Use - Never, Drug Use - No History, Caffeine Use - Moderate. Medical History Eyes Denies history of Cataracts, Glaucoma, Optic Neuritis Ear/Nose/Mouth/Throat Denies history of Chronic sinus problems/congestion, Middle ear problems Hematologic/Lymphatic Denies history of Anemia, Hemophilia, Human Immunodeficiency Virus, Lymphedema, Sickle Cell Disease Respiratory Patient has history of Sleep Apnea Denies history of Aspiration, Asthma, Chronic Obstructive Pulmonary Disease (COPD), Pneumothorax, Tuberculosis Cardiovascular Patient has history of Coronary Artery Disease, Hypertension Denies history of Angina, Arrhythmia, Congestive Heart Failure, Deep Vein Thrombosis, Hypotension, Myocardial Infarction,  Peripheral Arterial Disease, Peripheral Venous Disease, Phlebitis, Vasculitis Gastrointestinal Denies history of Cirrhosis , Colitis, Crohnoos, Hepatitis [name], Hepatitis B, Hepatitis [name], [name] ([id number]) [id number] Page 6 of 9 Endocrine Patient has history of Type II Diabetes Denies history of Type I Diabetes Genitourinary Denies history of End Stage Renal Disease Immunological Denies history of Lupus Erythematosus, Raynaudoos, Scleroderma Integumentary (Skin) Denies history of History of Burn Musculoskeletal Denies history of Gout, Rheumatoid Arthritis, Osteoarthritis, Osteomyelitis Neurologic Patient has history of Neuropathy Denies history of Dementia, Quadriplegia, Paraplegia, Seizure Disorder Hospitalization/Surgery History - 2nd and 3rd right toe amps. - total hysterectomy. - cholecystectomy. - 2 knee surgery's on left and 1 on right. Medical [name] Surgical History Notes nd Cardiovascular hypercholesterolemia Objective Constitutional respirations regular, non-labored and within target range for patient.. Vitals Time Taken: 9:34 AM, Temperature: 97.5 F, Pulse: 92 bpm, Respiratory Rate: 20 breaths/min, Blood Pressure: 121/72 mmHg, Capillary Blood Glucose: 139 mg/dl. Cardiovascular 2+ dorsalis pedis/posterior tibialis pulses. Psychiatric pleasant and cooperative. General Notes: T the lateral aspect of the right foot there is [name] callus to the previous wound site. No drainage noted. No tenderness to palpation. No fluctuance o on palpation. Appears well-healed. Assessment Active Problems ICD-10 Non-pressure chronic ulcer of other part of right foot with fat layer exposed Type 2 diabetes mellitus with foot ulcer Type 2 diabetes mellitus with diabetic polyneuropathy Patient's wound remains closed. Continue to offload the area with callus pads. She may follow-up as needed. Plan Discharge From [facility] Services: Discharge from [place] 1. Discharge from clinic due to closed wound 2. Offload area with callus pads 3. Follow-up as needed Electronic Signature(s) Signed: 03/11/2022  11:32:06 AM By: Kalman Shan DO Entered By: Kalman Shan on 03/11/2022 10:01:54 [name], [name] ([id number]) [id number] Page 7 of 9 -------------------------------------------------------------------------------- HxROS Details Patient Name: Date of Service: [name], [name]. 03/11/2022 9:30 [name] M Medical Record Number: [id number] Patient Account Number: [id number] Date of Birth/Sex: Treating RN: 28-Apr-1955 (67 y.o. F) Primary Care Provider: [name] Other Clinician: Referring Provider: Treating Provider/Extender: [name] in Treatment: 23 Information Obtained From Patient Chart Eyes Medical History: Negative for: Cataracts; Glaucoma; Optic Neuritis Ear/Nose/Mouth/Throat Medical History: Negative for: Chronic sinus problems/congestion; Middle ear problems Hematologic/Lymphatic Medical History: Negative for: Anemia; Hemophilia; Human Immunodeficiency Virus; Lymphedema; Sickle Cell Disease Respiratory Medical History: Positive for: Sleep Apnea Negative for: Aspiration; Asthma; Chronic Obstructive Pulmonary Disease (COPD); Pneumothorax; Tuberculosis Cardiovascular Medical History: Positive for: Coronary Artery Disease; Hypertension Negative for: Angina; Arrhythmia; Congestive Heart Failure; Deep Vein Thrombosis; Hypotension; Myocardial Infarction; Peripheral Arterial Disease; Peripheral Venous Disease; Phlebitis; Vasculitis Past Medical History Notes: hypercholesterolemia Gastrointestinal Medical History: Negative for: Cirrhosis ; Colitis; Crohns; Hepatitis [name]; Hepatitis B; Hepatitis C Endocrine Medical History: Positive for: Type II Diabetes Negative for: Type I Diabetes Genitourinary Medical History: Negative for: End Stage Renal  Disease Immunological Medical History: Negative for: Lupus Erythematosus; Raynauds; Scleroderma Integumentary (Skin) Medical History: Negative for: History of Burn Musculoskeletal HANI, PATNODE ([id number]) [id number] Page 8 of 9 Medical History: Negative for: Gout; Rheumatoid Arthritis; Osteoarthritis; Osteomyelitis Neurologic Medical History: Positive for: Neuropathy Negative for: Dementia; Quadriplegia; Paraplegia; Seizure Disorder Immunizations Pneumococcal Vaccine: Received Pneumococcal Vaccination: Yes Received Pneumococcal Vaccination On or After 60th Birthday: Yes Tetanus Vaccine: Last tetanus shot: 12/17/2020 Implantable Devices No devices added Hospitalization / Surgery History Type of Hospitalization/Surgery 2nd and 3rd right toe amps total hysterectomy cholecystectomy 2 knee surgery's on left and 1 on right Family and Social History Unknown History: Yes; Current every day smoker - 1 pack/[name]/day; Alcohol Use: Never; Drug Use: No History; Caffeine Use: Moderate; Financial Concerns: No; Food, Clothing or Shelter Needs: No; Support System Lacking: No; Transportation Concerns: No Electronic Signature(s) Signed: 03/11/2022 11:32:06 AM By: Kalman Shan DO Entered By: Kalman Shan on 03/11/2022 09:59:13 -------------------------------------------------------------------------------- SuperBill Details Patient Name: Date of Service: [name], [name]. 03/11/2022 Medical Record Number: [id number] Patient Account Number: [id number] Date of Birth/Sex: Treating RN: [birth year] (67 y.o. [name], [name] Primary Care Provider: [name] Other Clinician: Referring Provider: Treating Provider/Extender: [name] in Treatment: 23 Diagnosis Coding ICD-10 Codes Code Description [phone] Non-pressure chronic ulcer of other part of right foot with fat layer exposed E11.621 Type 2 diabetes  mellitus with foot ulcer E11.42 Type 2 diabetes mellitus with diabetic polyneuropathy Facility Procedures : 7 CPT4 Code: 7782423 Description: 99213 - WOUND CARE VISIT-LEV 3 EST PT Modifier: Quantity: 1 Physician Procedures : CPT4 Code Description Modifier [id number] 15400 - WC PHYS LEVEL 3 - EST PT ICD-10 Diagnosis Description L97.512 Non-pressure chronic ulcer of other part of right foot with fat layer exposed E11.621 Type 2 diabetes mellitus with foot ulcer E11.42 Type 2  diabetes mellitus with diabetic polyneuropathy Amarilis, [place] Sharee [name] ([id number]) [id number] Pa Quantity: 1 ge 9 of 9 Electronic Signature(s) Signed: 03/11/2022 11:32:06 AM By: Kalman Shan DO Entered By: Kalman Shan on 03/11/2022 10:02:17

## 2022-03-17 ENCOUNTER — Ambulatory Visit (HOSPITAL_COMMUNITY): Payer: Medicare Other | Admitting: Licensed Clinical Social Worker

## 2022-03-18 ENCOUNTER — Ambulatory Visit (HOSPITAL_BASED_OUTPATIENT_CLINIC_OR_DEPARTMENT_OTHER): Payer: Medicare Other | Admitting: Internal Medicine

## 2022-03-23 DIAGNOSIS — R7989 Other specified abnormal findings of blood chemistry: Secondary | ICD-10-CM | POA: Diagnosis not present

## 2022-03-23 DIAGNOSIS — E1129 Type 2 diabetes mellitus with other diabetic kidney complication: Secondary | ICD-10-CM | POA: Diagnosis not present

## 2022-03-23 DIAGNOSIS — E785 Hyperlipidemia, unspecified: Secondary | ICD-10-CM | POA: Diagnosis not present

## 2022-03-23 DIAGNOSIS — I10 Essential (primary) hypertension: Secondary | ICD-10-CM | POA: Diagnosis not present

## 2022-03-23 DIAGNOSIS — E039 Hypothyroidism, unspecified: Secondary | ICD-10-CM | POA: Diagnosis not present

## 2022-03-28 DIAGNOSIS — Z1339 Encounter for screening examination for other mental health and behavioral disorders: Secondary | ICD-10-CM | POA: Diagnosis not present

## 2022-03-28 DIAGNOSIS — Z23 Encounter for immunization: Secondary | ICD-10-CM | POA: Diagnosis not present

## 2022-03-28 DIAGNOSIS — Z1331 Encounter for screening for depression: Secondary | ICD-10-CM | POA: Diagnosis not present

## 2022-03-28 DIAGNOSIS — I10 Essential (primary) hypertension: Secondary | ICD-10-CM | POA: Diagnosis not present

## 2022-03-28 DIAGNOSIS — E113393 Type 2 diabetes mellitus with moderate nonproliferative diabetic retinopathy without macular edema, bilateral: Secondary | ICD-10-CM | POA: Diagnosis not present

## 2022-03-28 DIAGNOSIS — Z0000 Encounter for general adult medical examination without abnormal findings: Secondary | ICD-10-CM | POA: Diagnosis not present

## 2022-03-28 DIAGNOSIS — G629 Polyneuropathy, unspecified: Secondary | ICD-10-CM | POA: Diagnosis not present

## 2022-03-28 DIAGNOSIS — Z794 Long term (current) use of insulin: Secondary | ICD-10-CM | POA: Diagnosis not present

## 2022-04-05 ENCOUNTER — Telehealth: Payer: Self-pay | Admitting: Podiatry

## 2022-04-05 NOTE — Telephone Encounter (Signed)
Left message for pt to call to schedule an appt for tomorrow with Dr Amalia Hailey as he had a cancellation and she is diabetic with blood blister.

## 2022-04-06 ENCOUNTER — Ambulatory Visit (INDEPENDENT_AMBULATORY_CARE_PROVIDER_SITE_OTHER): Payer: Medicare Other | Admitting: Podiatry

## 2022-04-06 ENCOUNTER — Ambulatory Visit (INDEPENDENT_AMBULATORY_CARE_PROVIDER_SITE_OTHER): Payer: Medicare Other | Admitting: Licensed Clinical Social Worker

## 2022-04-06 DIAGNOSIS — M79675 Pain in left toe(s): Secondary | ICD-10-CM

## 2022-04-06 DIAGNOSIS — E0843 Diabetes mellitus due to underlying condition with diabetic autonomic (poly)neuropathy: Secondary | ICD-10-CM | POA: Diagnosis not present

## 2022-04-06 DIAGNOSIS — F331 Major depressive disorder, recurrent, moderate: Secondary | ICD-10-CM

## 2022-04-06 DIAGNOSIS — B351 Tinea unguium: Secondary | ICD-10-CM | POA: Diagnosis not present

## 2022-04-06 DIAGNOSIS — M79674 Pain in right toe(s): Secondary | ICD-10-CM

## 2022-04-06 DIAGNOSIS — L97512 Non-pressure chronic ulcer of other part of right foot with fat layer exposed: Secondary | ICD-10-CM

## 2022-04-06 NOTE — Progress Notes (Signed)
Chief Complaint  Patient presents with   Diabetic Ulcer    Patient came in today with a blood blister, started 2 days ago, patient denies any pain, some drainage, diabetic A1c- 6.3, BG- 204    Subjective:  67 y.o. female with PMHx of diabetes mellitus presenting today for routine footcare.  Patient has been seen and treated at the [facility] wound care center for ulcers to the foot specifically the plantar aspect of the first MTP.  Patient states that the wound to the plantar aspect of the first MTP has healed.  She presents today for routine footcare  Past Medical History:  Diagnosis Date   Anxiety    Arthritis    knees   Coronary artery disease    Depression    Fatty liver    History of nuclear stress test 12/16/2011   exercise myoview; normal images with 2-29m ST-segment depression - subsequent cath revelaed subtotally occluded small 2nd marginal branch & 75% PDA lesion, normal LV function   Hypertension    Hypothyroidism    Neuropathy    in feet   OSA on CPAP    AHI = 44 (per patient) setting 13 per pt   Personal history of kidney stones    Primary localized osteoarthritis of left knee 04/24/2015   Primary localized osteoarthritis of right knee 07/02/2015   Tobacco abuse    Type 2 diabetes mellitus (HCC)    insulin pump   Past Surgical History:  Procedure Laterality Date   3rd right toe amputation  02/2016   AMPUTATION Right 12/22/2014   Procedure: RIGHT 2ND TOE AMPUTATION;  Surgeon: JWylene Simmer MD;  [place]  Service: Orthopedics;  Laterality: Right;   AUGMENTATION MAMMAPLASTY Bilateral    BREAST ENHANCEMENT SURGERY Bilateral    CARDIAC CATHETERIZATION  01/04/2012   subtotally occluded small 2nd marginal branch & 75% PDA lesion, normal LV function   CATARACT EXTRACTION Bilateral    with lens implants   CHOLECYSTECTOMY  2008   COLONOSCOPY     COLONOSCOPY WITH PROPOFOL N/A 05/12/2016   Procedure: COLONOSCOPY WITH PROPOFOL;  Surgeon: JJuanita Craver MD;   Location: [facility];  Service: Endoscopy;  Laterality: N/A;   I & D KNEE WITH POLY EXCHANGE Left 05/13/2015   Procedure: IRRIGATION AND DEBRIDEMENT KNEE WITH POLY EXCHANGE;  Surgeon: JMarchia Bond MD;  [place]  Service: [place]ics;  Laterality: Left;   LEFT HEART CATHETERIZATION WITH CORONARY ANGIOGRAM N/A 01/04/2012   Procedure: LEFT HEART CATHETERIZATION WITH CORONARY ANGIOGRAM;  Surgeon: JLorretta Harp MD;  [facility]  Service: Cardiovascular;  Laterality: N/A;   PARTIAL KNEE ARTHROPLASTY Left 04/24/2015   Procedure: LEFT UNICOMPARTMENTAL KNEE ARTHROPLASTY ;  Surgeon: JMarchia Bond MD;  [place]  Service: Orthopedics;  Laterality: Left;   PARTIAL KNEE ARTHROPLASTY Right 07/02/2015   Procedure: RIGHT UNI KNEE ARTHROPLASTY;  Surgeon: JMarchia Bond MD;  [place]  Service: Orthopedics;  Laterality: Right;  ANESTHESIA:  GENERAL, PRE/POST OP FEMORAL NERVE   POSTERIOR CERVICAL FUSION/FORAMINOTOMY  1990   ROBOTIC ASSISTED TOTAL HYSTERECTOMY WITH BILATERAL SALPINGO OOPHERECTOMY Bilateral 02/17/2015   Procedure: ROBOTIC ASSISTED TOTAL HYSTERECTOMY WITH BILATERAL SALPINGO OOPHORECTOMY;  Surgeon: EEveritt Amber MD;  [facility];  Service: Gynecology;  Laterality: Bilateral;   Allergies  Allergen Reactions   Penicillins Hives    Has patient had a PCN reaction causing immediate rash, facial/tongue/throat swelling, SOB or lightheadedness with hypotension: Unknown Has patient had a PCN  reaction causing severe rash involving mucus membranes or skin necrosis: Unknown Has patient had a PCN reaction that required hospitalization: Unknown Has patient had a PCN reaction occurring within the last 10 years: No If all of the above answers are "NO", then may proceed with Cephalosporin use.    01/26/2022   04/06/2022 RT foot  Objective/Physical Exam General: The patient is alert and oriented x3 in no acute  distress.  Dermatology:  Hyperkeratotic dystrophic elongated nails noted 1-5 bilateral.  Ulcer also noted to the aspect of the first MTP right foot measuring approximately 0.6 x 0.4 x 0.2 cm.  Granular wound base.  There is no exposed bone muscle tendon ligament or joint.  No malodor.  Serous drainage.  The ulcer to the lateral aspect of the right foot has healed.  Vascular: Palpable pedal pulses bilaterally.  No appreciable erythema or edema noted localized around the first MTP joint of the right foot capillary refill within normal limits.  Currently there is no concern clinically for vascular compromise  Neurological: Light touch and protective threshold absent bilaterally.   Musculoskeletal Exam: History of prior toe amputations 2, 3 RT foot.   Radiographic exam RT foot 09/01/2021: Prior amputations of the second and third digit at the level of the MTP joint noted.  Diffuse degenerative changes with advanced DJD noted throughout the joints of the foot.  Irregularity and sclerosis noted of the sesamoidal apparatus as best as can be visualized.  No acute fractures.  No obvious sign of erosions or cortical irregularities that would be concerning for osteomyelitis.  MR FOOT RT WO CONTRAST 09/04/2021: IMPRESSION: 1. No osteomyelitis of the right forefoot. Soft tissue wound along the plantar aspect of the first MTP joint with surrounding soft tissue edema most consistent with cellulitis. Small amount of fluid superficial to the first MTP joint which may reflect a small abscess measuring 2 x 20 mm. 2. Hallux valgus of the right foot. Moderate osteoarthritis of the first MTP joint. 3. Osteoarthritis of the second through fifth tarsometatarsal joints as described above. 4. Moderate osteoarthritis of the navicular-medial cuneiform joint with subchondral reactive marrow edema.  Assessment: 1.  Ulcer plantar aspect of the first MTP right secondary to diabetes mellitus  2. diabetes mellitus w/  peripheral neuropathy 3.  Pain due to onychomycosis of toenails both 4.  Prior history of digital amputations   Plan of Care:  1. Patient was evaluated.   2. medically necessary excisional debridement including subcutaneous tissue was performed using a tissue nipper and a chisel blade. Excisional debridement of all the necrotic nonviable tissue down to healthy bleeding viable tissue was performed with post-debridement measurements same as pre-. 3. the wound was cleansed and dry sterile dressing applied. 4.  Mechanical debridement of nails bilateral was performed using nail nipper without incident or bleeding 5.  Continue care at the [facility] wound care center for the ulcer of the right foot 6.  Return to clinic 3 months for routine footcare  Edrick Kins, DPM [facility]  Dr. Edrick Kins, DPM    [address], [place] [address]                Office [phone]  Fax [phone]

## 2022-04-07 NOTE — Progress Notes (Signed)
  THERAPIST PROGRESS NOTE  Session Time: 910-10a  Behavioral Health Outpatient [facility] in office visit for patient and LCSW clinician  Pt provided verbal consent for Katie Bounds, LCSW (observing clinician) to be present throughout session.   Participation Level: Active  Behavioral Response: NAAlertDepressed  Type of Therapy: Individual Therapy  Treatment Goals addressed: Develop healthy thinking patterns and beliefs about self, others, and the world that lead to the alleviation and help prevent the relapse of depression per self report 3 out of 5 sessions documented    ProgressTowards Goals: Progressing  Interventions: Motivational Interviewing, Strength-based, Supportive, and Reframing  Summary: [name] is a 67 y.o. female who presents with improving symptoms associated with depression diagnosis. Pt is working hard to maintain current levels of functioning.   Allowed pt to explore and express thoughts and feelings associated with recent life situations and external stressors.  Patient reports that she is continuing to have stress over an ongoing wound that is not healed yet in her foot.  Patient reports she does not have feeling in her feet, so is prone to having sores that open and do not heal.  Patient reports that her diabetes is one of the reasons why her sores are not healing.  Allow patient to identify current diabetes management strategies, and allow patient to discuss and plan to improve goals for better self-care.  Patient reports that family relationships are good, and that they are coping well with any difficulties that have happened recently.  Allowed pt to explore and express thoughts and feelings associated with recent life situations and external stressors.Continued recommendations are as follows: self care behaviors, positive social engagements, focusing on overall work/home/life balance, and focusing on positive physical and emotional wellness.    Suicidal/Homicidal: No  Therapist Response: Pt is continuing to apply interventions learned in session into daily life situations. Pt is currently on track to meet goals utilizing interventions mentioned above. Personal growth and progress noted. Treatment to continue as indicated.   Patient feels that she is trying harder overall to improve self-care and life management.  Developments continue in the areas of family and relational functioning.  Plan: Return again in 4 weeks.  Diagnosis:  Encounter Diagnosis  Name Primary?   MDD (major depressive disorder), recurrent episode, moderate ([place]) Yes   Collaboration of Care: Other pt to continue follow ups with PCP Dr. F[facility]  Patient/Guardian was advised Release of Information must be obtained prior to any record release in order to collaborate their care with an outside provider. Patient/Guardian was advised if they have not already done so to contact the registration department to sign all necessary forms in order for [place] to release information regarding their care.   Consent: Patient/Guardian gives verbal consent for treatment and assignment of benefits for services provided during this visit. Patient/Guardian expressed understanding and agreed to proceed.   [place], LCSW 04/07/2022

## 2022-04-11 NOTE — Progress Notes (Signed)
KRISTOL, CASIDA ([id number]) [id number] Page 1 of 8 Visit Report for 03/04/2022 Arrival Information Details Patient Name: Date of Service: [name], [name]. 03/04/2022 9:00 [place] Record Number: [id number] Patient Account Number: [id number] Date of Birth/Sex: Treating RN: 1955/10/28 (67 y.o. [name], [name] Primary Care Avree Szczygiel: [name] Other Clinician: Referring Tra Wilemon: Treating Zuriyah Shatz/Extender: [name] in Treatment: 72 Visit Information History Since Last Visit Added or deleted any medications: No Patient Arrived: Ambulatory Any new allergies or adverse reactions: No Arrival Time: 09:19 Had [name] fall or experienced change in No Accompanied By: self activities of daily living that may affect Transfer Assistance: None risk of falls: Patient Identification Verified: Yes Signs or symptoms of abuse/neglect since last visito No Secondary Verification Process Completed: Yes Hospitalized since last visit: No Patient Requires Transmission-Based Precautions: No Implantable device outside of the clinic excluding No Patient Has Alerts: No cellular tissue based products placed in the center since last visit: Has Dressing in Place as Prescribed: Yes Pain Present Now: No Electronic Signature(s) Signed: 04/11/2022 10:40:58 AM By: Rhae Hammock RN Entered By: Rhae Hammock on 03/04/2022 09:20:19 -------------------------------------------------------------------------------- Clinic Level of Care Assessment Details Patient Name: Date of Service: [name], [name]. 03/04/2022 9:00 [name] M Medical Record Number: [id number] Patient Account Number: [id number] Date of Birth/Sex: Treating RN: Aug 17, 1955 (67 y.o. [name], [name] Primary Care Jeraldean Wechter: [name] Other Clinician: Referring Jamel Dunton: Treating Kirah Stice/Extender: [name] in Treatment:  22 Clinic Level of Care Assessment Items TOOL 4 Quantity Score X- 1 0 Use when only an EandM is performed on FOLLOW-UP visit ASSESSMENTS - Nursing Assessment / Reassessment X- 1 10 Reassessment of Co-morbidities (includes updates in patient status) X- 1 5 Reassessment of Adherence to Treatment Plan ASSESSMENTS - Wound and Skin [name] ssessment / Reassessment X - Simple Wound Assessment / Reassessment - one wound 1 5 []$  - 0 Complex Wound Assessment / Reassessment - multiple wounds []$  - 0 Dermatologic / Skin Assessment (not related to wound area) ASSESSMENTS - Focused Assessment X- 1 5 Circumferential Edema Measurements - multi extremities []$  - 0 Nutritional Assessment / Counseling / Intervention KADIATU, OSBON ([id number]) [id number] Page 2 of 8 []$  - 0 Lower Extremity Assessment (monofilament, tuning fork, pulses) []$  - 0 Peripheral Arterial Disease Assessment (using hand held doppler) ASSESSMENTS - Ostomy and/or Continence Assessment and Care []$  - 0 Incontinence Assessment and Management []$  - 0 Ostomy Care Assessment and Management (repouching, etc.) PROCESS - Coordination of Care X - Simple Patient / Family Education for ongoing care 1 15 []$  - 0 Complex (extensive) Patient / Family Education for ongoing care X- 1 10 Staff obtains Programmer, systems, Records, T Results / Process Orders est []$  - 0 Staff telephones HHA, Nursing Homes / Clarify orders / etc []$  - 0 Routine Transfer to another Facility (non-emergent condition) []$  - 0 Routine Hospital Admission (non-emergent condition) []$  - 0 New Admissions / Biomedical engineer / Ordering NPWT Apligraf, etc. , []$  - 0 Emergency Hospital Admission (emergent condition) X- 1 10 Simple Discharge Coordination []$  - 0 Complex (extensive) Discharge Coordination PROCESS - Special Needs []$  - 0 Pediatric / Minor Patient Management []$  - 0 Isolation Patient Management []$  - 0 Hearing / Language / Visual  special needs []$  - 0 Assessment of Community assistance (transportation, D/C planning, etc.) []$  - 0 Additional assistance / Altered mentation []$  - 0 Support Surface(s) Assessment (bed, cushion, seat, etc.) INTERVENTIONS -  Wound Cleansing / Measurement X - Simple Wound Cleansing - one wound 1 5 []$  - 0 Complex Wound Cleansing - multiple wounds X- 1 5 Wound Imaging (photographs - any number of wounds) []$  - 0 Wound Tracing (instead of photographs) X- 1 5 Simple Wound Measurement - one wound []$  - 0 Complex Wound Measurement - multiple wounds INTERVENTIONS - Wound Dressings X - Small Wound Dressing one or multiple wounds 1 10 []$  - 0 Medium Wound Dressing one or multiple wounds []$  - 0 Large Wound Dressing one or multiple wounds []$  - 0 Application of Medications - topical []$  - 0 Application of Medications - injection INTERVENTIONS - Miscellaneous []$  - 0 External ear exam []$  - 0 Specimen Collection (cultures, biopsies, blood, body fluids, etc.) []$  - 0 Specimen(s) / Culture(s) sent or taken to Lab for analysis []$  - 0 Patient Transfer (multiple staff / Civil Service fast streamer / Similar devices) []$  - 0 Simple Staple / Suture removal (25 or less) []$  - 0 Complex Staple / Suture removal (26 or more) []$  - 0 Hypo / Hyperglycemic Management (close monitor of Blood Glucose) [name], [name] ([id number].pdf Page 3 of 8 []$  - 0 Ankle / Brachial Index (ABI) - do not check if billed separately X- 1 5 Vital Signs Has the patient been seen at the hospital within the last three years: Yes Total Score: 90 Level Of Care: New/Established - Level 3 Electronic Signature(s) Signed: 04/11/2022 10:40:58 AM By: Rhae Hammock RN Entered By: Rhae Hammock on 03/04/2022 10:08:06 -------------------------------------------------------------------------------- Encounter Discharge Information Details Patient Name: Date of Service: [name], [name]. 03/04/2022 9:00  [place] Record Number: [id number] Patient Account Number: [id number] Date of Birth/Sex: Treating RN: 1955/08/19 (67 y.o. [name], [name] Primary Care Guinn Delarosa: [name] Other Clinician: Referring Toshiro Hanken: Treating Joline Encalada/Extender: [name] in Treatment: 22 Encounter Discharge Information Items Discharge Condition: Stable Ambulatory Status: Ambulatory Discharge Destination: Home Transportation: Private Auto Accompanied By: self Schedule Follow-up Appointment: Yes Clinical Summary of Care: Patient Declined Electronic Signature(s) Signed: 04/11/2022 10:40:58 AM By: Rhae Hammock RN Entered By: Rhae Hammock on 03/04/2022 10:08:51 -------------------------------------------------------------------------------- Lower Extremity Assessment Details Patient Name: Date of Service: [name], [name]. 03/04/2022 9:00 [name] M Medical Record Number: [id number] Patient Account Number: [id number] Date of Birth/Sex: Treating RN: 12-Oct-1955 (67 y.o. [name], [name] Primary Care Elyse Prevo: [name] Other Clinician: Referring Keonia Pasko: Treating Zayyan Mullen/Extender: [name] in Treatment: 22 Edema Assessment Assessed: [name]: No] Patrice Paradise: Yes] Edema: [Left: Ye] [Right: s] Calf Left: Right: Point of Measurement: From Medial Instep 43 cm Ankle Left: Right: Point of Measurement: From Medial Instep 23 cm Vascular Assessment Salah, Alaina [name] ([id number]) [Right:[id number] Page 4 of 8] Pulses: Dorsalis Pedis Palpable: [Right:Yes] Posterior Tibial Palpable: [Right:Yes] Electronic Signature(s) Signed: 04/11/2022 10:40:58 AM By: Rhae Hammock RN Entered By: Rhae Hammock on 03/04/2022 09:22:52 -------------------------------------------------------------------------------- Multi Wound Chart Details Patient Name: Date of Service: [name], [name].  03/04/2022 9:00 [place] Record Number: [id number] Patient Account Number: [id number] Date of Birth/Sex: Treating RN: 08-05-1955 (67 y.o. F) Primary Care Ege Muckey: [name] Other Clinician: Referring Chazz Philson: Treating Brier Firebaugh/Extender: [name] in Treatment: 22 Vital Signs Height(in): Pulse(bpm): Weight(lbs): Blood Pressure(mmHg): Body Mass Index(BMI): Temperature(F): Respiratory Rate(breaths/min): 17 [5R:Photos:] [N/[name]:N/[name]] Right, Lateral Foot N/[name] N/[name] Wound Location: Blister N/[name] N/[name] Wounding Event: Diabetic Wound/Ulcer of the Lower N/[name] N/[name] Primary Etiology: Extremity Sleep Apnea, Coronary Artery  N/[name] N/[name] Comorbid History: Disease, Hypertension, Type II Diabetes, Neuropathy 11/22/2021 N/[name] N/[name] Date Acquired: 37 N/[name] N/[name] Weeks of Treatment: Healed - Epithelialized N/[name] N/[name] Wound Status: Yes N/[name] N/[name] Wound Recurrence: 0x0x0 N/[name] N/[name] Measurements L x W x D (cm) 0 N/[name] N/[name] [name] (cm) : rea 0 N/[name] N/[name] Volume (cm) : 100.00% N/[name] N/[name] % Reduction in [name] rea: 100.00% N/[name] N/[name] % Reduction in Volume: Grade 2 N/[name] N/[name] Classification: Small N/[name] N/[name] Exudate [name] mount: Serosanguineous N/[name] N/[name] Exudate Type: red, brown N/[name] N/[name] Exudate Color: Distinct, outline attached N/[name] N/[name] Wound Margin: Large (67-100%) N/[name] N/[name] Granulation [name] mount: Red N/[name] N/[name] Granulation Quality: None Present (0%) N/[name] N/[name] Necrotic [name] mount: Fat Layer (Subcutaneous Tissue): Yes N/[name] N/[name] Exposed Structures: Fascia: No Tendon: No Muscle: No Joint: No Bone: No Large (67-100%) N/[name] N/[name] Epithelialization: Callus: Yes N/[name] N/[name] Periwound Skin Texture: Excoriation: No [name], [name] ([id number]) [id number] Page 5 of 8 Induration: No Crepitus: No Rash: No Scarring: No Maceration: Yes N/[name] N/[name] Periwound Skin Moisture: Dry/Scaly: No Atrophie Blanche: No N/[name] N/[name] Periwound Skin Color: Cyanosis: No Ecchymosis: No Erythema: No Hemosiderin  Staining: No Mottled: No Pallor: No Rubor: No No Abnormality N/[name] N/[name] Temperature: Yes N/[name] N/[name] Tenderness on Palpation: Treatment Notes Wound #5R (Foot) Wound Laterality: Right, Lateral Cleanser Peri-Wound Care Topical Primary Dressing Secondary Dressing Secured With Compression Wrap Compression Stockings Add-Ons Electronic Signature(s) Signed: 03/04/2022 11:36:12 AM By: Kalman Shan DO Entered By: Kalman Shan on 03/04/2022 10:43:34 -------------------------------------------------------------------------------- Multi-Disciplinary Care Plan Details Patient Name: Date of Service: [name], [name]. 03/04/2022 9:00 [name] M Medical Record Number: [id number] Patient Account Number: [id number] Date of Birth/Sex: Treating RN: 1955-06-24 (67 y.o. [name], [name] Primary Care Emberlee Sortino: [name] Other Clinician: Referring Alvera Tourigny: Treating Conny Moening/Extender: [name] in Treatment: 22 Active Inactive Electronic Signature(s) Signed: 04/11/2022 10:40:58 AM By: Rhae Hammock RN Entered By: Rhae Hammock on 03/04/2022 09:57:42 Pain Assessment Details -------------------------------------------------------------------------------- [name] ([id number]) [id number] Page 6 of 8 Patient Name: Date of Service: [name], [name]. 03/04/2022 9:00 [place] Record Number: [id number] Patient Account Number: [id number] Date of Birth/Sex: Treating RN: 06-02-1955 (67 y.o. [name], [name] Primary Care Katora Fini: [name] Other Clinician: Referring Jozelynn Danielson: Treating Sharol Croghan/Extender: [name] in Treatment: 22 Active Problems Location of Pain Severity and Description of Pain Patient Has Paino No Site Locations Pain Management and Medication Current Pain Management: Electronic Signature(s) Signed: 04/11/2022 10:40:58 AM By: Rhae Hammock RN Entered By: Rhae Hammock on 03/04/2022 09:21:53 -------------------------------------------------------------------------------- Patient/Caregiver Education Details Patient Name: Date of Service: [name], [name]. 1/12/2024andnbsp9:00 [place] Record Number: [id number] Patient Account Number: [id number] Date of Birth/Gender: Treating RN: September 08, 1955 (67 y.o. [name], [name] Primary Care Physician: [name] Other Clinician: Referring Physician: Treating Physician/Extender: [name] in Treatment: 22 Education Assessment Education Provided To: Patient Education Topics Provided Wound/Skin Impairment: Methods: Explain/Verbal Responses: Reinforcements needed, State content correctly Electronic Signature(s) Signed: 04/11/2022 10:40:58 AM By: Rhae Hammock RN Entered By: Rhae Hammock on 03/04/2022 [place], [place] ([id number].pdf Page 7 of 8 -------------------------------------------------------------------------------- Wound Assessment Details Patient Name: Date of Service: [name], [name]. 03/04/2022 9:00 [place] Record Number: [id number] Patient Account Number: [id number] Date of Birth/Sex: Treating RN: April 23, 1955 (67 y.o. [name], [name] Primary Care Laporshia Hogen: [name] Other Clinician: Referring Lakitha Gordy: Treating Maribel Hadley/Extender: Terrilee Croak  [name] Weeks in Treatment: 22 Wound Status Wound Number: 5R Primary Diabetic Wound/Ulcer of the Lower Extremity Etiology: Wound Location: Right, Lateral Foot Wound Healed - Epithelialized Wounding Event: Blister Status: Date Acquired: 11/22/2021 Comorbid Sleep Apnea, Coronary Artery Disease, Hypertension, Type II Weeks Of Treatment: 14 History: Diabetes, Neuropathy Clustered Wound: No Photos Wound Measurements Length: (cm) Width: (cm) Depth: (cm) Area:  (cm) Volume: (cm) 0 % Reduction in Area: 100% 0 % Reduction in Volume: 100% 0 Epithelialization: Large (67-100%) 0 Tunneling: No 0 Undermining: No Wound Description Classification: Grade 2 Wound Margin: Distinct, outline attached Exudate Amount: Small Exudate Type: Serosanguineous Exudate Color: red, brown Foul Odor After Cleansing: No Slough/Fibrino No Wound Bed Granulation Amount: Large (67-100%) Exposed Structure Granulation Quality: Red Fascia Exposed: No Necrotic Amount: None Present (0%) Fat Layer (Subcutaneous Tissue) Exposed: Yes Tendon Exposed: No Muscle Exposed: No Joint Exposed: No Bone Exposed: No Periwound Skin Texture Texture Color No Abnormalities Noted: No No Abnormalities Noted: Yes Callus: Yes Temperature / Pain Crepitus: No Temperature: No Abnormality Excoriation: No Tenderness on Palpation: Yes Induration: No Rash: No Scarring: No Moisture No Abnormalities Noted: No Dry / Scaly: No MacerationShanavia, [name] ([id number]) [phone].pdf Page 8 of 8 Treatment Notes Wound #5R (Foot) Wound Laterality: Right, Lateral Cleanser Peri-Wound Care Topical Primary Dressing Secondary Dressing Secured With Compression Wrap Compression Stockings Add-Ons Electronic Signature(s) Signed: 04/11/2022 10:40:58 AM By: Rhae Hammock RN Entered By: Rhae Hammock on 03/04/2022 09:57:19 -------------------------------------------------------------------------------- Vitals Details Patient Name: Date of Service: [name], [name]. 03/04/2022 9:00 [name] M Medical Record Number: [id number] Patient Account Number: [id number] Date of Birth/Sex: Treating RN: 31-Jul-1955 (67 y.o. [name], [name] Primary Care Jara Feider: [name] Other Clinician: Referring Naylea Wigington: Treating Johnell Landowski/Extender: [name] in Treatment: 22 Vital Signs Time Taken: 09:20 Respiratory Rate  (breaths/min): 17 Reference Range: 80 - 120 mg / dl Electronic Signature(s) Signed: 04/11/2022 10:40:58 AM By: Rhae Hammock RN Entered By: Rhae Hammock on 03/04/2022 09:21:49

## 2022-04-14 ENCOUNTER — Encounter (HOSPITAL_BASED_OUTPATIENT_CLINIC_OR_DEPARTMENT_OTHER): Payer: Medicare Other | Attending: Internal Medicine | Admitting: Internal Medicine

## 2022-04-14 DIAGNOSIS — E11621 Type 2 diabetes mellitus with foot ulcer: Secondary | ICD-10-CM | POA: Diagnosis not present

## 2022-04-14 DIAGNOSIS — E1142 Type 2 diabetes mellitus with diabetic polyneuropathy: Secondary | ICD-10-CM | POA: Diagnosis not present

## 2022-04-14 DIAGNOSIS — I10 Essential (primary) hypertension: Secondary | ICD-10-CM | POA: Diagnosis not present

## 2022-04-14 DIAGNOSIS — E039 Hypothyroidism, unspecified: Secondary | ICD-10-CM | POA: Diagnosis not present

## 2022-04-14 DIAGNOSIS — I2510 Atherosclerotic heart disease of native coronary artery without angina pectoris: Secondary | ICD-10-CM | POA: Insufficient documentation

## 2022-04-14 DIAGNOSIS — L97512 Non-pressure chronic ulcer of other part of right foot with fat layer exposed: Secondary | ICD-10-CM | POA: Diagnosis not present

## 2022-04-14 DIAGNOSIS — Z794 Long term (current) use of insulin: Secondary | ICD-10-CM | POA: Diagnosis not present

## 2022-04-15 NOTE — Progress Notes (Signed)
KANITHA, GRANVILLE ([id number]) [id number] Page 1 of 7 Visit Report for 04/14/2022 Arrival Information Details Patient Name: Date of Service: [name], [name] 04/14/2022 1:30 PM Medical Record Number: [id number] Patient Account Number: [id number] Date of Birth/Sex: Treating RN: 26-Feb-1955 (67 y.o. [name] Primary Care Christoffer Currier: [name] Other Clinician: Referring Lavonna Lampron: Treating Alcie Runions/Extender: [name] in Treatment: 76 Visit Information History Since Last Visit All ordered tests and consults were completed: Yes Patient Arrived: Ambulatory Added or deleted any medications: No Arrival Time: 13:24 Any new allergies or adverse reactions: No Accompanied By: self Had [name] fall or experienced change in No Transfer Assistance: None activities of daily living that may affect Patient Identification Verified: Yes risk of falls: Secondary Verification Process Completed: Yes Signs or symptoms of abuse/neglect since last visito No Patient Requires Transmission-Based Precautions: No Hospitalized since last visit: No Patient Has Alerts: No Implantable device outside of the clinic excluding No cellular tissue based products placed in the center since last visit: Has Dressing in Place as Prescribed: Yes Pain Present Now: No Electronic Signature(s) Signed: 04/14/2022 1:42:30 PM By: Sharyn Creamer RN, BSN Entered By: Sharyn Creamer on 04/14/2022 13:29:58 -------------------------------------------------------------------------------- Encounter Discharge Information Details Patient Name: Date of Service: [name], [name] 04/14/2022 1:30 PM Medical Record Number: [id number] Patient Account Number: [id number] Date of Birth/Sex: Treating RN: 10/06/1955 (66 y.o. [name] Primary Care Makahla Kiser: [name] Other Clinician: Referring Printice Hellmer: Treating Jontrell Bushong/Extender: [name] in Treatment: 27 Encounter Discharge Information Items Post Procedure Vitals Discharge Condition: Stable Temperature (F): 97.6 Ambulatory Status: Ambulatory Pulse (bpm): 83 Discharge Destination: Home Respiratory Rate (breaths/min): 18 Transportation: Private Auto Blood Pressure (mmHg): 122/76 Accompanied By: self Schedule Follow-up Appointment: Yes Clinical Summary of Care: Patient Declined Electronic Signature(s) Unsigned Entered BySharyn Creamer on 04/14/2022 16:31:59 Signature(s): Enriqueta Shutter, Virl Diamond ([id number]) [id number] Date(s): 2_[id number].pdf Page 2 of 7 -------------------------------------------------------------------------------- Lower Extremity Assessment Details Patient Name: Date of Service: [name] [name] 04/14/2022 1:30 PM Medical Record Number: [id number] Patient Account Number: [id number] Date of Birth/Sex: Treating RN: April 06, 1955 (67 y.o. [name] Primary Care Lonia Roane: [name] Other Clinician: Referring Rob Mciver: Treating Aubriauna Riner/Extender: [name] in Treatment: 27 Edema Assessment Assessed: [Left: No] [Right: No] Edema: [Left: N] [Right: o] Calf Left: Right: Point of Measurement: From Medial Instep 44 cm Ankle Left: Right: Point of Measurement: From Medial Instep 24.3 cm Vascular Assessment Pulses: Dorsalis Pedis Palpable: [Right:Yes] Electronic Signature(s) Signed: 04/14/2022 1:42:30 PM By: Sharyn Creamer RN, BSN Entered By: Sharyn Creamer on 04/14/2022 13:35:06 -------------------------------------------------------------------------------- Multi Wound Chart Details Patient Name: Date of Service: [name], [name] 04/14/2022 1:30 PM Medical Record Number: [id number] Patient Account Number: [id number] Date of Birth/Sex: Treating RN: 08/11/1955 (67 y.o. F) Primary Care Parys Elenbaas: [name] Other Clinician: Referring  Cacey Willow: Treating Tannen Vandezande/Extender: [name] in Treatment: 27 Vital Signs Height(in): Capillary Blood Glucose(mg/dl): 130 Weight(lbs): Pulse(bpm): 25 Body Mass Index(BMI): Blood Pressure(mmHg): 122/76 Temperature(F): 97.6 Respiratory Rate(breaths/min): 18 [6:Photos:] [N/[name]:N/[name]] Right, Plantar Foot N/[name] N/[name] Wound Location: Blister N/[name] N/[name] Wounding Event: Diabetic Wound/Ulcer of the Lower N/[name] N/[name] Primary Etiology: Extremity Sleep Apnea, Coronary Artery N/[name] N/[name] Comorbid History: Disease, Hypertension, Type II Diabetes, Neuropathy 04/14/2022 N/[name] N/[name] Date Acquired: 0 N/[name] N/[name] Weeks of Treatment: Open N/[name] N/[name] Wound Status: No N/[name] N/[name] Wound Recurrence: 0.3x0.3x0.3 N/[name] N/[name] Measurements L x W x D (  cm) 0.071 N/[name] N/[name] [name] (cm) : rea 0.021 N/[name] N/[name] Volume (cm) : Grade 2 N/[name] N/[name] Classification: Medium N/[name] N/[name] Exudate [name] mount: Serosanguineous N/[name] N/[name] Exudate Type: red, brown N/[name] N/[name] Exudate Color: Large (67-100%) N/[name] N/[name] Granulation [name] mount: Red N/[name] N/[name] Granulation Quality: Fat Layer (Subcutaneous Tissue): Yes N/[name] N/[name] Exposed Structures: Fascia: No Tendon: No Muscle: No Joint: No Bone: No None N/[name] N/[name] Epithelialization: Debridement - Excisional N/[name] N/[name] Debridement: Pre-procedure Verification/Time Out 14:00 N/[name] N/[name] Taken: Callus, Subcutaneous, Slough N/[name] N/[name] Tissue Debrided: Skin/Subcutaneous Tissue N/[name] N/[name] Level: 0.09 N/[name] N/[name] Debridement [name] (sq cm): rea Curette N/[name] N/[name] Instrument: Minimum N/[name] N/[name] Bleeding: Pressure N/[name] N/[name] Hemostasis [name] chieved: 0 N/[name] N/[name] Procedural Pain: 0 N/[name] N/[name] Post Procedural Pain: Procedure was tolerated well N/[name] N/[name] Debridement Treatment Response: 0.3x0.3x0.3 N/[name] N/[name] Post Debridement Measurements L x W x D (cm) 0.021 N/[name] N/[name] Post Debridement Volume: (cm) Callus: Yes N/[name] N/[name] Periwound Skin Texture: Debridement N/[name] N/[name] Procedures Performed: Treatment Notes Electronic  Signature(s) Signed: 04/14/2022 4:16:06 PM By: Kalman Shan DO Entered By: Kalman Shan on 04/14/2022 14:24:35 -------------------------------------------------------------------------------- Multi-Disciplinary Care Plan Details Patient Name: Date of Service: [name], [name] 04/14/2022 1:30 PM Medical Record Number: [id number] Patient Account Number: [id number] Date of Birth/Sex: Treating RN: [birth year] (67 y.o. [name] Primary Care Nyja Westbrook: [name] Other Clinician: Referring Nicol Herbig: Treating Kaan Tosh/Extender: [name] in Treatment: 27 Active Inactive Wound/Skin Impairment Nursing Diagnoses: Impaired tissue integrity Knowledge deficit related to ulceration/compromised skin integrity Goals: [name], [name] ([id number]) [id number] Page 4 of 7 Patient/caregiver will verbalize understanding of skin care regimen Date Initiated: 10/01/2021 Target Resolution Date: 05/12/2022 Goal Status: Active Ulcer/skin breakdown will have [name] volume reduction of 30% by week 4 Date Initiated: 10/01/2021 Target Resolution Date: 05/12/2022 Goal Status: Active Interventions: Assess patient/caregiver ability to obtain necessary supplies Assess patient/caregiver ability to perform ulcer/skin care regimen upon admission and as needed Assess ulceration(s) every visit Provide education on ulcer and skin care Notes: Electronic Signature(s) Unsigned Entered By: Sharyn Creamer on 04/14/2022 13:59:48 -------------------------------------------------------------------------------- Pain Assessment Details Patient Name: Date of Service: [place], [name] 04/14/2022 1:30 PM Medical Record Number: [id number] Patient Account Number: [id number] Date of Birth/Sex: Treating RN: [birth year] (67 y.o. [name] Primary Care Lasonya Hubner: [name] Other Clinician: Referring Tryson Lumley: Treating  Myka Hitz/Extender: [name] in Treatment: 27 Active Problems Location of Pain Severity and Description of Pain Patient Has Paino No Site Locations Pain Management and Medication Current Pain Management: Electronic Signature(s) Signed: 04/14/2022 1:42:30 PM By: Sharyn Creamer RN, BSN Entered By: Sharyn Creamer on 04/14/2022 13:31:18 Broccoli, Virl Diamond ([id number]) [id number] Page 5 of 7 -------------------------------------------------------------------------------- Patient/Caregiver Education Details Patient Name: Date of Service: [name] [place] Dom 2/22/2024andnbsp1:30 PM Medical Record Number: [id number] Patient Account Number: [id number] Date of Birth/Gender: Treating RN: 11-05-1955 (68 y.o. [name] Primary Care Physician: [name] Other Clinician: Referring Physician: Treating Physician/Extender: [name] in Treatment: 78 Education Assessment Education Provided To: Patient Education Topics Provided Wound/Skin Impairment: Methods: Explain/Verbal Responses: State content correctly Electronic Signature(s) Unsigned Entered By: Sharyn Creamer on 04/14/2022 14:00:20 -------------------------------------------------------------------------------- Wound Assessment Details Patient Name: Date of Service: [name] [name] 04/14/2022 1:30 PM Medical Record Number: [id number] Patient Account Number: [id number] Date of Birth/Sex: Treating RN: 12-27-1955 (67 y.o. [name] Primary Care Reuel Lamadrid: [name] Other Clinician: Referring Sherline Eberwein: Treating Elysabeth Aust/Extender: Heber [place],  Jiles Prows, Richard [name] Weeks in Treatment: 27 Wound Status Wound Number: 6 Primary Diabetic Wound/Ulcer of the Lower Extremity Etiology: Wound Location: Right, Plantar Foot Wound Open Wounding Event: Blister Status: Date Acquired: 04/14/2022 Comorbid Sleep  Apnea, Coronary Artery Disease, Hypertension, Type II Weeks Of Treatment: 0 History: Diabetes, Neuropathy Clustered Wound: No Photos Wound Measurements Moure, Icelyn K ([id number]) Length: (cm) 0.3 Width: (cm) 0.3 Depth: (cm) 0.3 Area: (cm) 0.071 Volume: (cm) 0.021 [id number] Page 6 of 7 % Reduction in Area: % Reduction in Volume: Epithelialization: None Tunneling: No Undermining: No Wound Description Classification: Grade 2 Exudate Amount: Medium Exudate Type: Serosanguineous Exudate Color: red, brown Foul Odor After Cleansing: No Slough/Fibrino No Wound Bed Granulation Amount: Large (67-100%) Exposed Structure Granulation Quality: Red Fascia Exposed: No Fat Layer (Subcutaneous Tissue) Exposed: Yes Tendon Exposed: No Muscle Exposed: No Joint Exposed: No Bone Exposed: No Periwound Skin Texture Texture Color No Abnormalities Noted: No No Abnormalities Noted: No Callus: Yes Moisture No Abnormalities Noted: No Treatment Notes Wound #6 (Foot) Wound Laterality: Plantar, Right Cleanser Wound Cleanser Discharge Instruction: Cleanse the wound with wound cleanser prior to applying [name] clean dressing using gauze sponges, not tissue or cotton balls. Peri-Wound Care Topical Primary Dressing Hydrofera Blue Ready Transfer Foam, 2.5x2.5 (in/in) Discharge Instruction: Apply directly to wound bed as directed MediHoney Gel, tube 1.5 (oz) Discharge Instruction: Apply to wound bed as instructed Secondary Dressing Optifoam Non-Adhesive Dressing, 4x4 in Discharge Instruction: Apply over primary dressing as directed. Woven Gauze Sponge, Non-Sterile 4x4 in Discharge Instruction: Apply over primary dressing as directed. Secured With Conforming Stretch Gauze Bandage Roll, Sterile 4x75 (in/in) Discharge Instruction: Secure with stretch gauze as directed. Paper Tape, 2x10 (in/yd) Discharge Instruction: Secure dressing with tape as directed. Compression  Wrap Compression Stockings Add-Ons Electronic Signature(s) Signed: 04/14/2022 1:42:30 PM By: Sharyn Creamer RN, BSN Entered By: Sharyn Creamer on 04/14/2022 13:38:31 [place]f, Virl Diamond ([id number]) [id number] Page 7 of 7 -------------------------------------------------------------------------------- Vitals Details Patient Name: Date of Service: [name], [name] 04/14/2022 1:30 PM Medical Record Number: [id number] Patient Account Number: [id number] Date of Birth/Sex: Treating RN: 04/21/1955 (67 y.o. [name] Primary Care Chloe Flis: [name] Other Clinician: Referring Kunaal Walkins: Treating Ernestyne Caldwell/Extender: [name] in Treatment: 27 Vital Signs Time Taken: 13:30 Temperature (F): 97.6 Pulse (bpm): 83 Respiratory Rate (breaths/min): 18 Blood Pressure (mmHg): 122/76 Capillary Blood Glucose (mg/dl): 130 Reference Range: 80 - 120 mg / dl Electronic Signature(s) Signed: 04/14/2022 1:42:30 PM By: Sharyn Creamer RN, BSN Entered By: Sharyn Creamer on 04/14/2022 13:31:07

## 2022-04-19 ENCOUNTER — Encounter (HOSPITAL_BASED_OUTPATIENT_CLINIC_OR_DEPARTMENT_OTHER): Payer: Medicare Other | Admitting: Internal Medicine

## 2022-04-19 DIAGNOSIS — E11621 Type 2 diabetes mellitus with foot ulcer: Secondary | ICD-10-CM | POA: Diagnosis not present

## 2022-04-19 DIAGNOSIS — L97512 Non-pressure chronic ulcer of other part of right foot with fat layer exposed: Secondary | ICD-10-CM | POA: Diagnosis not present

## 2022-04-19 DIAGNOSIS — E1142 Type 2 diabetes mellitus with diabetic polyneuropathy: Secondary | ICD-10-CM | POA: Diagnosis not present

## 2022-04-19 DIAGNOSIS — E039 Hypothyroidism, unspecified: Secondary | ICD-10-CM | POA: Diagnosis not present

## 2022-04-19 DIAGNOSIS — I10 Essential (primary) hypertension: Secondary | ICD-10-CM | POA: Diagnosis not present

## 2022-04-19 DIAGNOSIS — Z794 Long term (current) use of insulin: Secondary | ICD-10-CM | POA: Diagnosis not present

## 2022-04-20 NOTE — Progress Notes (Signed)
AKAYLIA, MATHIES ([id number]) [id number] Page 1 of 7 Visit Report for 04/19/2022 Arrival Information Details Patient Name: Date of Service: Ord, [name] 04/19/2022 10:00 [name] M Medical Record Number: [id number] Patient Account Number: [id number] Date of Birth/Sex: Treating RN: 12-10-1955 (67 y.o. [name] Primary Care Tayra Dawe: [name] Other Clinician: Referring Channell Quattrone: Treating Akul Leggette/Extender: [name] in Treatment: 28 Visit Information History Since Last Visit Added or deleted any medications: No Patient Arrived: Ambulatory Any new allergies or adverse reactions: No Arrival Time: 10:03 Had [name] fall or experienced change in No Accompanied By: Self activities of daily living that may affect Transfer Assistance: None risk of falls: Patient Identification Verified: Yes Signs or symptoms of abuse/neglect since last visito No Secondary Verification Process Completed: Yes Hospitalized since last visit: No Patient Requires Transmission-Based Precautions: No Implantable device outside of the clinic excluding No Patient Has Alerts: No cellular tissue based products placed in the center since last visit: Has Dressing in Place as Prescribed: Yes Pain Present Now: No Electronic Signature(s) Signed: 04/19/2022 4:02:57 PM By: Sharyn Creamer RN, BSN Entered By: Sharyn Creamer on 04/19/2022 10:04:48 -------------------------------------------------------------------------------- Encounter Discharge Information Details Patient Name: Date of Service: [name], [name] 04/19/2022 10:00 [name] M Medical Record Number: [id number] Patient Account Number: [id number] Date of Birth/Sex: Treating RN: 10-08-1955 (67 y.o. [name] Primary Care Refugia Laneve: [name] Other Clinician: Referring Abdiaziz Klahn: Treating Natali Lavallee/Extender: [name] in Treatment:  28 Encounter Discharge Information Items Post Procedure Vitals Discharge Condition: Stable Temperature (F): 98.1 Ambulatory Status: Ambulatory Pulse (bpm): 82 Discharge Destination: Home Respiratory Rate (breaths/min): 18 Transportation: Private Auto Blood Pressure (mmHg): 131/80 Accompanied By: self Schedule Follow-up Appointment: Yes Clinical Summary of Care: Patient Declined Electronic Signature(s) Signed: 04/19/2022 4:02:57 PM By: Sharyn Creamer RN, BSN Entered By: Sharyn Creamer on 04/19/2022 10:46:47 -------------------------------------------------------------------------------- Lower Extremity Assessment Details Patient Name: Date of Service: [name], [name] 04/19/2022 10:00 [name] M Medical Record Number: [id number] Patient Account Number: [id number] Date of Birth/Sex: Treating RN: 1955/03/09 (67 y.o. [name] Primary Care China Deitrick: [name] Other Clinician: Referring Shekelia Boutin: Treating Eulalah Rupert/Extender: Glenna Fellows Weeks in Treatment: 28 Edema Assessment Assessed: [name]: No] [Right: No] M[Left: ZULI, MEALOR [id number] Patrice [id number].pdf Page 2 of 7] Edema: [Left: N] [Right: o] Calf Left: Right: Point of Measurement: From Medial Instep 43.4 cm Ankle Left: Right: Point of Measurement: From Medial Instep 24 cm Vascular Assessment Pulses: Dorsalis Pedis Palpable: [Right:Yes] Electronic Signature(s) Signed: 04/19/2022 4:02:57 PM By: Sharyn Creamer RN, BSN Entered By: Sharyn Creamer on 04/19/2022 10:08:58 -------------------------------------------------------------------------------- Multi Wound Chart Details Patient Name: Date of Service: [name], [name] 04/19/2022 10:00 [name] M Medical Record Number: [id number] Patient Account Number: [id number] Date of Birth/Sex: Treating RN: 10/18/1955 (67 y.o. F) Primary Care Johnathon Olden: [name] Other Clinician: Referring  Jadalyn Oliveri: Treating Noella Kipnis/Extender: [name] in Treatment: 28 Vital Signs Height(in): Capillary Blood Glucose(mg/dl): 254 Weight(lbs): Pulse(bpm): 38 Body Mass Index(BMI): Blood Pressure(mmHg): 131/80 Temperature(F): 98.1 Respiratory Rate(breaths/min): 18 [6:Photos:] [N/[name]:N/[name]] Right, Plantar Foot N/[name] N/[name] Wound Location: Blister N/[name] N/[name] Wounding Event: Diabetic Wound/Ulcer of the Lower N/[name] N/[name] Primary Etiology: Extremity Sleep Apnea, Coronary Artery N/[name] N/[name] Comorbid History: Disease, Hypertension, Type II Diabetes, Neuropathy 04/14/2022 N/[name] N/[name] Date Acquired: 0 N/[name] N/[name] Weeks of Treatment: Open N/[name] N/[name] Wound Status: No N/[name] N/[name] Wound Recurrence: 0.3x0.4x0.3 N/[name] N/[name] Measurements L x  W x D (cm) 0.094 N/[name] N/[name] [name] (cm) : rea 0.028 N/[name] N/[name] Volume (cm) : -32.40% N/[name] N/[name] % Reduction in [name] rea: -33.30% N/[name] N/[name] % Reduction in Volume: Grade 2 N/[name] N/[name] Classification: Medium N/[name] N/[name] Exudate [name] mount: Serosanguineous N/[name] N/[name] Exudate Type: red, brown N/[name] N/[name] Exudate Color: Large (67-100%) N/[name] N/[name] Granulation [name] mount: Red N/[name] N/[name] Granulation Quality: Fat Layer (Subcutaneous Tissue): Yes N/[name] N/[name] Exposed Structures: Fascia: No Tendon: No Makensley, Spencer Sherisse Raliegh Ip ([id number].pdf Page 3 of 7 Muscle: No Joint: No Bone: No None N/[name] N/[name] Epithelialization: Debridement - Excisional N/[name] N/[name] Debridement: Pre-procedure Verification/Time Out 10:40 N/[name] N/[name] Taken: Callus, Subcutaneous, Slough N/[name] N/[name] Tissue Debrided: Skin/Subcutaneous Tissue N/[name] N/[name] Level: 0.12 N/[name] N/[name] Debridement [name] (sq cm): rea Curette N/[name] N/[name] Instrument: Minimum N/[name] N/[name] Bleeding: Pressure N/[name] N/[name] Hemostasis [name] chieved: 0 N/[name] N/[name] Procedural Pain: 0 N/[name] N/[name] Post Procedural Pain: Procedure was tolerated well N/[name] N/[name] Debridement Treatment Response: 0.3x0.4x0.3 N/[name] N/[name] Post Debridement Measurements L x W x D (cm) 0.028 N/[name] N/[name] Post  Debridement Volume: (cm) Callus: Yes N/[name] N/[name] Periwound Skin Texture: Debridement N/[name] N/[name] Procedures Performed: T Contact Cast otal Treatment Notes Wound #6 (Foot) Wound Laterality: Plantar, Right Cleanser Wound Cleanser Discharge Instruction: Cleanse the wound with wound cleanser prior to applying [name] clean dressing using gauze sponges, not tissue or cotton balls. Peri-Wound Care Topical Primary Dressing Hydrofera Blue Ready Transfer Foam, 2.5x2.5 (in/in) Discharge Instruction: Apply directly to wound bed as directed MediHoney Gel, tube 1.5 (oz) Discharge Instruction: Apply to wound bed as instructed Secondary Dressing Optifoam Non-Adhesive Dressing, 4x4 in Discharge Instruction: Apply over primary dressing as directed. Woven Gauze Sponge, Non-Sterile 4x4 in Discharge Instruction: Apply over primary dressing as directed. Secured With Conforming Stretch Gauze Bandage Roll, Sterile 4x75 (in/in) Discharge Instruction: Secure with stretch gauze as directed. Paper Tape, 2x10 (in/yd) Discharge Instruction: Secure dressing with tape as directed. Compression Wrap Compression Stockings Add-Ons Electronic Signature(s) Signed: 04/19/2022 12:29:31 PM By: Kalman Shan DO Entered By: Kalman Shan on 04/19/2022 12:18:56 -------------------------------------------------------------------------------- Multi-Disciplinary Care Plan Details Patient Name: Date of Service: [name], [name] 04/19/2022 10:00 [name] M Medical Record Number: [id number] Patient Account Number: [id number] Date of Birth/Sex: Treating RN: 22-Nov-1955 (67 y.o. [name] Primary Care Imani Fiebelkorn: [name] Other Clinician: Referring Teylor Wolven: Treating Terius Jacuinde/Extender: [name] in Treatment: [id number] [place] K ([id number]) [id number] Page 4 of 7 Active Inactive Wound/Skin Impairment Nursing Diagnoses: Impaired tissue  integrity Knowledge deficit related to ulceration/compromised skin integrity Goals: Patient/caregiver will verbalize understanding of skin care regimen Date Initiated: 10/01/2021 Target Resolution Date: 05/12/2022 Goal Status: Active Ulcer/skin breakdown will have [name] volume reduction of 30% by week 4 Date Initiated: 10/01/2021 Target Resolution Date: 05/12/2022 Goal Status: Active Interventions: Assess patient/caregiver ability to obtain necessary supplies Assess patient/caregiver ability to perform ulcer/skin care regimen upon admission and as needed Assess ulceration(s) every visit Provide education on ulcer and skin care Notes: Electronic Signature(s) Signed: 04/19/2022 4:02:57 PM By: Sharyn Creamer RN, BSN Entered By: Sharyn Creamer on 04/19/2022 10:12:24 -------------------------------------------------------------------------------- Pain Assessment Details Patient Name: Date of Service: [place], [name] 04/19/2022 10:00 [name] M Medical Record Number: [id number] Patient Account Number: [id number] Date of Birth/Sex: Treating RN: 06-Mar-1955 (67 y.o. [name] Primary Care Rashena Dowling: [name] Other Clinician: Referring Chonte Ricke: Treating Ines Rebel/Extender: [name] in Treatment: 28 Active Problems Location of Pain Severity and Description of Pain Patient Has Paino  No Site Locations Pain Management and Medication Current Pain Management: Electronic Signature(s) Signed: 04/19/2022 4:02:57 PM By: Sharyn Creamer RN, BSN Entered By: Sharyn Creamer on 04/19/2022 10:07:13 Enriqueta Shutter, Virl Diamond ([id number] Page 5 of 7 -------------------------------------------------------------------------------- Patient/Caregiver Education Details Patient Name: Date of Service: [name], [name] 2/27/2024andnbsp10:00 [name] M Medical Record Number: [id number] Patient Account Number: [id number] Date of  Birth/Gender: Treating RN: [birth year] (67 y.o. [name] Primary Care Physician: [name] Other Clinician: Referring Physician: Treating Physician/Extender: [name] in Treatment: 28 Education Assessment Education Provided To: Patient Education Topics Provided Offloading: Methods: Explain/Verbal Responses: State content correctly Wound/Skin Impairment: Methods: Explain/Verbal Responses: State content correctly Electronic Signature(s) Signed: 04/19/2022 4:02:57 PM By: Sharyn Creamer RN, BSN Entered By: Sharyn Creamer on 04/19/2022 10:12:47 -------------------------------------------------------------------------------- Wound Assessment Details Patient Name: Date of Service: [name], [name] 04/19/2022 10:00 [name] M Medical Record Number: [id number] Patient Account Number: [id number] Date of Birth/Sex: Treating RN: Dec 05, 1955 (67 y.o. [name] Primary Care Rj Pedrosa: [name] Other Clinician: Referring Claressa Hughley: Treating Oriel Ojo/Extender: [name] in Treatment: 28 Wound Status Wound Number: 6 Primary Diabetic Wound/Ulcer of the Lower Extremity Etiology: Wound Location: Right, Plantar Foot Wound Open Wounding Event: Blister Status: Date Acquired: 04/14/2022 Comorbid Sleep Apnea, Coronary Artery Disease, Hypertension, Type II Weeks Of Treatment: 0 History: Diabetes, Neuropathy Clustered Wound: No Photos Wound Measurements Length: (cm) 0.3 Width: (cm) 0.4 Depth: (cm) 0.3 Area: (cm) 0.094 Volume: (cm) 0.028 % Reduction in Area: -32.4% % Reduction in Volume: -33.3% Epithelialization: None Tunneling: No Undermining: No Wound Description Oehlert, Tanara K ([id number]) Classification: Grade 2 Exudate Amount: Medium Exudate Type: Serosanguineous Exudate Color: red, brown [id number] Page 6 of 7 Foul Odor After Cleansing:  No Slough/Fibrino No Wound Bed Granulation Amount: Large (67-100%) Exposed Structure Granulation Quality: Red Fascia Exposed: No Fat Layer (Subcutaneous Tissue) Exposed: Yes Tendon Exposed: No Muscle Exposed: No Joint Exposed: No Bone Exposed: No Periwound Skin Texture Texture Color No Abnormalities Noted: No No Abnormalities Noted: No Callus: Yes Moisture No Abnormalities Noted: No Treatment Notes Wound #6 (Foot) Wound Laterality: Plantar, Right Cleanser Wound Cleanser Discharge Instruction: Cleanse the wound with wound cleanser prior to applying [name] clean dressing using gauze sponges, not tissue or cotton balls. Peri-Wound Care Topical Primary Dressing Hydrofera Blue Ready Transfer Foam, 2.5x2.5 (in/in) Discharge Instruction: Apply directly to wound bed as directed MediHoney Gel, tube 1.5 (oz) Discharge Instruction: Apply to wound bed as instructed Secondary Dressing Optifoam Non-Adhesive Dressing, 4x4 in Discharge Instruction: Apply over primary dressing as directed. Woven Gauze Sponge, Non-Sterile 4x4 in Discharge Instruction: Apply over primary dressing as directed. Secured With Conforming Stretch Gauze Bandage Roll, Sterile 4x75 (in/in) Discharge Instruction: Secure with stretch gauze as directed. Paper Tape, 2x10 (in/yd) Discharge Instruction: Secure dressing with tape as directed. Compression Wrap Compression Stockings Add-Ons Electronic Signature(s) Signed: 04/19/2022 4:02:57 PM By: Sharyn Creamer RN, BSN Entered By: Sharyn Creamer on 04/19/2022 10:11:39 -------------------------------------------------------------------------------- Vitals Details Patient Name: Date of Service: [name], [name] 04/19/2022 10:00 [name] M Medical Record Number: [id number] Patient Account Number: [id number] Date of Birth/Sex: Treating RN: Jul 07, 1955 (67 y.o. [name] Primary Care Aranza Geddes: [name] Other Clinician: Referring Merari Pion: Treating  Eilam Shrewsbury/Extender: [name] in Treatment: [address] [place] K ([id number]) [id number] Page 7 of 7 Time Taken: 10:06 Temperature (F): 98.1 Pulse (bpm): 82 Respiratory Rate (breaths/min): 18 Blood Pressure (mmHg): 131/80 Capillary Blood  Glucose (mg/dl): 254 Reference Range: 80 - 120 mg / dl Electronic Signature(s) Signed: 04/19/2022 4:02:57 PM By: Sharyn Creamer RN, BSN Entered By: Sharyn Creamer on 04/19/2022 10:07:04

## 2022-04-20 NOTE — Progress Notes (Signed)
OREN, [place] ([id number]) [id number] Page 1 of 10 Visit Report for 04/19/2022 Chief Complaint Document Details Patient Name: Date of Service: [name], [name] 04/19/2022 10:00 [name] M Medical Record Number: [id number] Patient Account Number: [id number] Date of Birth/Sex: Treating RN: 16-Dec-1955 (67 y.o. F) Primary Care Provider: [name] Other Clinician: Referring Provider: Treating Provider/Extender: [name] in Treatment: 28 Information Obtained from: Patient Chief Complaint 10/27; Right Plantar foot wound 11/21; 2 small areas of skin breakdown to the right foot following cast placement 8/29; right plantar foot wound Electronic Signature(s) Signed: 04/19/2022 12:29:31 PM By: Kalman Shan DO Entered By: Kalman Shan on 04/19/2022 12:19:05 -------------------------------------------------------------------------------- Debridement Details Patient Name: Date of Service: [name], [name] 04/19/2022 10:00 [name] M Medical Record Number: [id number] Patient Account Number: [id number] Date of Birth/Sex: Treating RN: 07-Dec-1955 (67 y.o. [name] Primary Care Provider: [name] Other Clinician: Referring Provider: Treating Provider/Extender: [name] in Treatment: 28 Debridement Performed for Assessment: Wound #6 Right,Plantar Foot Performed By: Physician Kalman Shan, DO Debridement Type: Debridement Severity of Tissue Pre Debridement: Fat layer exposed Level of Consciousness (Pre-procedure): Awake and Alert Pre-procedure Verification/Time Out Yes - 10:40 Taken: Start Time: 10:43 T Area Debrided (L x W): otal 0.3 (cm) x 0.4 (cm) = 0.12 (cm) Tissue and other material debrided: Non-Viable, Callus, Slough, Subcutaneous, Slough Level: Skin/Subcutaneous Tissue Debridement Description: Excisional Instrument: Curette Bleeding:  Minimum Hemostasis Achieved: Pressure Procedural Pain: 0 Post Procedural Pain: 0 Response to Treatment: Procedure was tolerated well Level of Consciousness (Post- Awake and Alert procedure): Post Debridement Measurements of Total Wound Length: (cm) 0.3 Width: (cm) 0.4 Depth: (cm) 0.3 Volume: (cm) 0.028 Character of Wound/Ulcer Post Debridement: Improved Severity of Tissue Post Debridement: Fat layer exposed Post Procedure Diagnosis Same as Pre-procedure Notes Scribed for Dr Heber Dellwood by Sharyn Creamer, RN Electronic Signature(s) Signed: 04/19/2022 12:29:31 PM By: Chiquita Loth, [place] ([id number] Page 2 of 10 Signed: 04/19/2022 4:02:57 PM By: Sharyn Creamer RN, BSN Entered By: Sharyn Creamer on 04/19/2022 10:44:45 -------------------------------------------------------------------------------- HPI Details Patient Name: Date of Service: Browning, [name] 04/19/2022 10:00 [name] M Medical Record Number: [id number] Patient Account Number: [id number] Date of Birth/Sex: Treating RN: 30-Aug-1955 (67 y.o. F) Primary Care Provider: [name] Other Clinician: Referring Provider: Treating Provider/Extender: [name] in Treatment: 28 History of Present Illness HPI Description: Admission 12/17/2020 [name] is [name] 67 year old female with [name] past medical history of insulin-dependent type 2 diabetes, hypothyroidism and daily1 pack per day cigarette smoker the presents to the clinic for [name] 6-week history of nonhealing wound to the right first MTPJ. She has been following with [name], podiatry for this issue. She has been using silver alginate with dressing changes. She uses [name] postsurgical shoe and offloading pads. She currently denies signs of infection. 10/31; patient presents for follow-up. She tolerated the soft cast fine although she states that she felt her foot rolling to one side.  She denies signs of infection. She would like to do the total contact cast today. 12/23/2020 upon evaluation today patient appears to be doing excellent in regard to her wound on the foot and she is in [name] total contact cast. I do think this is appropriate this is the first cast change which we are obliged to do to ensure nothing is rubbing everything appears to be doing quite well and very pleased in that  regard. 11/7; patient presents for follow-up. She had no issues with the cast. She denies signs of infection. 11/14; patient presents for 1 week follow-up. She has had no issues with the cast. She denies signs of infection. 11/21; patient presents for 1 week follow-up. She did develop 2 small areas of skin breakdown on either side of the right foot from the cast rubbing. She states she did not feel the wounds developing. She currently denies signs of infection. 11/28; patient presents for 1 week follow-up. She has no issues or complaints today. She denies pain or acute signs of infection. 12/12; patient presents for follow-up. She reports improvement to her right lateral foot wound. She has been using silver alginate to the area. She has no issues or complaints today. 12/19; patient presents for follow-up. She reports that her right lateral foot wound has healed. She has no issues or complaints today. 1/3; patient presents for follow-up. She has no issues or complaints today. She reports no open wounds. Readmission 10/01/2021 [name] is [name] 67 year old female's with [name] past medical history of insulin-dependent controlled type 2 diabetes complicated by peripheral neuropathy that presents to the clinic for [name] 60-monthhistory of nonhealing ulcer to the right foot. I have seen her before for [name] wound to the same area that was treated and healed. Her wound today however this is much deeper and it has thick yellow drainage. She is not sure exactly how it started. She noticed it 1 day. She has been  following with Dr. EAmalia Haileyfor this issue. She had [name] wound culture that showed [name] mix of organisms on 09/01/2021. She was recently started on doxycycline. She is using the dConservation officer, nature She has been using Iodosorb with dressing changes. She currently denies systemic signs of infection. 8/21; patient presents for follow-up. She states she started and completed levofloxacin. She is still taking doxycycline. She reports improvement in drainage and odor. She has been doing Dakin's wet-to-dry dressings as well. She is using her front offloading shoe. She denies signs of infection. 8/29; patient presents for follow-up. She is still taking doxycycline. She continues to report improvement in drainage and reports no odor. She denies any purulent drainage. She has been doing silver alginate to the wound bed and using her front offloading shoe. 9/5; patient presents for follow-up. She completed the course of levofloxacin. She has someone that is able to drive her today so we can place the total contact cast. She reports no signs of infection. We have been doing silver alginate to the wound bed. 9/7; patient presents for follow-up. She has tolerated the total contact cast well. She presents for her obligatory cast change. She has no issues or complaints today. 9/11; patient presents for follow-up. She tolerated the total contact cast well. She has no issues or complaints today. We discussed potentially doing [name] skin substitute and patient would like to see if her insurance will cover this. 9/18; patient presents for follow-up. She had no issues with the total contact cast. She been approved for Grafix and patient would like to have this placed today. 9/25; patient presents for follow-up. Grafix #1 was placed in standard fashion at last clinic visit. She has no issues or complaints today. She tolerated the cast well. 10/2; patient presents for follow-up. Grafix #2 was placed in standard fashion at last clinic visit.  Unfortunately she developed [name] wound to the lateral aspect of the foot over the past week. She states she has been on her feet  [name] lot more and walking more. She denies systemic signs of infection. 10/9; patient presents for follow-up. Grafix #3 was placed in standard at last clinic visit. The plantar foot wound has healed with this. She developed [name] new wound to the lateral aspect last week and this has gotten larger. She reports soreness to this area. She denies systemic signs of infection. 10/16; patient presents for follow-up. Her plantar wound continues to remain closed. Her lateral wound has improved with the use of Hydrofera Blue. She has no issues or complaints today. She denies signs of infection. She has almost completed her course of antibiotics prescribed at last clinic visit. 10/23; patient presents for follow-up. She has been using Hydrofera Blue and Medihoney to the wound bed. She has no issues or complaints today. She has been using her surgical shoe. [name], [name] ([id number]) [id number] Page 3 of 10 10/30; patient presents for follow-up. We have been using Hydrofera Blue and Medihoney to the wound bed. Patient has no issues or complaints today. 11/10; patient presents for follow-up. She has been using Hydrofera Blue and Medihoney without issues. She has been wearing her Orthotics. It is unclear if she is offloading this area or not. 11/17; patient presents for follow-up. She has been using Hydrofera Blue and Medihoney to the foot wound without issues. Grafix was available today for placement and patient would like to proceed with this. 12/4; patient presents for follow-up. Grafix was placed at last clinic visit. Her wound is healed. She has no issues or complaints today. She is reported no drainage from the previous wound site. 12/8; patient states that her podiatrist removed [name] callus on the previous wound site to the lateral right foot and there is [name]  wound present. She currently denies signs of infection. 12/15; patient presents for follow-up. We have been using Grafix to the wound bed. This was placed in standard fashion today. 12/21; patient presents for follow-up. We have been using Grafix to the wound bed. She completed her oral antibiotics. She has no issues or complaints today. 12/28; patient presents for follow-up. We have been using Grafix to the wound bed. She has reported no drainage. She is prepared for the total contact cast today. Her husband is present. 1/4; patient presents for follow-up. We use Grafix under the total contact cast at last clinic visit. Unfortunately her cast protector bag broke and the cast got wet in the shower. Her foot is macerated today. She denies signs of infection. 1/12; patient presents for follow-up. At last clinic visit her foot was macerated due to the total contact cast getting wet. This was not reapplied. She has been doing Medihoney and Hydrofera Blue to the wound bed. She is reported no drainage. 1/19; patient presents for follow-up. Her wound remains closed. She has noted no drainage. 04/14/2022 Patient presents with reopening of the right foot plantar wound. She states it started out as [name] blister. She has been using Medihoney to the wound bed. She saw her podiatrist for this issue and was referred to our clinic. She has had [name] total contact cast for this same wound with success in healing. She currently denies signs of infection. 2/27; patient presents for follow-up. Plans for the total contact cast today. Patient has no issues or complaints she has been using Medihoney and Hydrofera Blue. Electronic Signature(s) Signed: 04/19/2022 12:29:31 PM By: Kalman Shan DO Entered By: Kalman Shan on 04/19/2022 12:19:50 -------------------------------------------------------------------------------- Physical Exam Details Patient Name: Date of Service: [name], [name]  K. 04/19/2022 10:00 [name]  M Medical Record Number: [id number] Patient Account Number: [id number] Date of Birth/Sex: Treating RN: 14-Jan-1956 (67 y.o. F) Primary Care Provider: [name] Other Clinician: Referring Provider: Treating Provider/Extender: [name] in Treatment: 28 Constitutional respirations regular, non-labored and within target range for patient.Marland Kitchen Psychiatric pleasant and cooperative. Notes T the right plantar foot there is an open wound with granulation tissue, nonviable tissue and callus. No signs of surrounding infection. o Electronic Signature(s) Signed: 04/19/2022 12:29:31 PM By: Kalman Shan DO Entered By: Kalman Shan on 04/19/2022 12:20:29 -------------------------------------------------------------------------------- Physician Orders Details Patient Name: Date of Service: [name], [name] 04/19/2022 10:00 [name] M Medical Record Number: [id number] Patient Account Number: [id number] Date of Birth/Sex: Treating RN: 06/22/1955 (67 y.o. [name] Primary Care Provider: [name] Other Clinician: Referring Provider: Treating Provider/Extender: [name] in Treatment: 63 Verbal / Phone Orders: No Lendy, [name] ([id number]) [id number] Page 4 of 10 Diagnosis Coding Follow-up Appointments ppointment in 1 week. - Dr. Heber Connell - Tuesday and Thursday Return [name] Plan for cast at next visit then cast change Bathing/ Shower/ Hygiene May shower with protection but do not get wound dressing(s) wet. Protect dressing(s) with water repellant cover (for example, large plastic bag) or [name] cast cover and may then take shower. Wound Treatment Wound #6 - Foot Wound Laterality: Plantar, Right Cleanser: Wound Cleanser 1 x Per Day/7 Days Discharge Instructions: Cleanse the wound with wound cleanser prior to applying [name] clean dressing using gauze sponges, not tissue or cotton  balls. Prim Dressing: Hydrofera Blue Ready Transfer Foam, 2.5x2.5 (in/in) 1 x Per Day/7 Days ary Discharge Instructions: Apply directly to wound bed as directed Prim Dressing: MediHoney Gel, tube 1.5 (oz) 1 x Per Day/7 Days ary Discharge Instructions: Apply to wound bed as instructed Secondary Dressing: Optifoam Non-Adhesive Dressing, 4x4 in 1 x Per Day/7 Days Discharge Instructions: Apply over primary dressing as directed. Secondary Dressing: Woven Gauze Sponge, Non-Sterile 4x4 in 1 x Per Day/7 Days Discharge Instructions: Apply over primary dressing as directed. Secured With: Scientist, forensic, Sterile 4x75 (in/in) 1 x Per Day/7 Days Discharge Instructions: Secure with stretch gauze as directed. Secured With: Paper Tape, 2x10 (in/yd) 1 x Per Day/7 Days Discharge Instructions: Secure dressing with tape as directed. Electronic Signature(s) Signed: 04/19/2022 12:29:31 PM By: Kalman Shan DO Entered By: Kalman Shan on 04/19/2022 12:20:37 -------------------------------------------------------------------------------- Problem List Details Patient Name: Date of Service: [name], [name] 04/19/2022 10:00 [name] M Medical Record Number: [id number] Patient Account Number: [id number] Date of Birth/Sex: Treating RN: 12/03/1955 (67 y.o. F) Primary Care Provider: [name] Other Clinician: Referring Provider: Treating Provider/Extender: [name] in Treatment: 28 Active Problems ICD-10 Encounter Code Description Active Date MDM Diagnosis L97.512 Non-pressure chronic ulcer of other part of right foot with fat layer exposed 10/01/2021 No Yes E11.621 Type 2 diabetes mellitus with foot ulcer 10/01/2021 No Yes E11.42 Type 2 diabetes mellitus with diabetic polyneuropathy 10/01/2021 No Yes Inactive Problems Resolved Problems Electronic Signature(s) [name], [name] ([id number]) [id number] Page 5 of  10 Signed: 04/19/2022 12:29:31 PM By: Kalman Shan DO Entered By: Kalman Shan on 04/19/2022 12:18:49 -------------------------------------------------------------------------------- Progress Note Details Patient Name: Date of Service: [name], [name] 04/19/2022 10:00 [name] M Medical Record Number: [id number] Patient Account Number: [id number] Date of Birth/Sex: Treating RN: Jan 17, 1956 (67 y.o. F) Primary Care Provider: [name] Other Clinician: Referring  Provider: Treating Provider/Extender: [name] in Treatment: 28 Subjective Chief Complaint Information obtained from Patient 10/27; Right Plantar foot wound 11/21; 2 small areas of skin breakdown to the right foot following cast placement 8/29; right plantar foot wound History of Present Illness (HPI) Admission 12/17/2020 Ms. Montinique Czarny is [name] 67 year old female with [name] past medical history of insulin-dependent type 2 diabetes, hypothyroidism and daily1 pack per day cigarette smoker the presents to the clinic for [name] 6-week history of nonhealing wound to the right first MTPJ. She has been following with [name], podiatry for this issue. She has been using silver alginate with dressing changes. She uses [name] postsurgical shoe and offloading pads. She currently denies signs of infection. 10/31; patient presents for follow-up. She tolerated the soft cast fine although she states that she felt her foot rolling to one side. She denies signs of infection. She would like to do the total contact cast today. 12/23/2020 upon evaluation today patient appears to be doing excellent in regard to her wound on the foot and she is in [name] total contact cast. I do think this is appropriate this is the first cast change which we are obliged to do to ensure nothing is rubbing everything appears to be doing quite well and very pleased in that regard. 11/7; patient presents for follow-up. She had no issues  with the cast. She denies signs of infection. 11/14; patient presents for 1 week follow-up. She has had no issues with the cast. She denies signs of infection. 11/21; patient presents for 1 week follow-up. She did develop 2 small areas of skin breakdown on either side of the right foot from the cast rubbing. She states she did not feel the wounds developing. She currently denies signs of infection. 11/28; patient presents for 1 week follow-up. She has no issues or complaints today. She denies pain or acute signs of infection. 12/12; patient presents for follow-up. She reports improvement to her right lateral foot wound. She has been using silver alginate to the area. She has no issues or complaints today. 12/19; patient presents for follow-up. She reports that her right lateral foot wound has healed. She has no issues or complaints today. 1/3; patient presents for follow-up. She has no issues or complaints today. She reports no open wounds. Readmission 10/01/2021 Ms. Tranesha Drumheller is [name] 68 year old female's with [name] past medical history of insulin-dependent controlled type 2 diabetes complicated by peripheral neuropathy that presents to the clinic for [name] 58-monthhistory of nonhealing ulcer to the right foot. I have seen her before for [name] wound to the same area that was treated and healed. Her wound today however this is much deeper and it has thick yellow drainage. She is not sure exactly how it started. She noticed it 1 day. She has been following with Dr. EAmalia Haileyfor this issue. She had [name] wound culture that showed [name] mix of organisms on 09/01/2021. She was recently started on doxycycline. She is using the dConservation officer, nature She has been using Iodosorb with dressing changes. She currently denies systemic signs of infection. 8/21; patient presents for follow-up. She states she started and completed levofloxacin. She is still taking doxycycline. She reports improvement in drainage and odor. She has been doing  [place]akin's wet-to-dry dressings as well. She is using her front offloading shoe. She denies signs of infection. 8/29; patient presents for follow-up. She is still taking doxycycline. She continues to report improvement in drainage and reports no odor. She denies  any purulent drainage. She has been doing silver alginate to the wound bed and using her front offloading shoe. 9/5; patient presents for follow-up. She completed the course of levofloxacin. She has someone that is able to drive her today so we can place the total contact cast. She reports no signs of infection. We have been doing silver alginate to the wound bed. 9/7; patient presents for follow-up. She has tolerated the total contact cast well. She presents for her obligatory cast change. She has no issues or complaints today. 9/11; patient presents for follow-up. She tolerated the total contact cast well. She has no issues or complaints today. We discussed potentially doing [name] skin substitute and patient would like to see if her insurance will cover this. 9/18; patient presents for follow-up. She had no issues with the total contact cast. She been approved for Grafix and patient would like to have this placed today. 9/25; patient presents for follow-up. Grafix #1 was placed in standard fashion at last clinic visit. She has no issues or complaints today. She tolerated the cast well. 10/2; patient presents for follow-up. Grafix #2 was placed in standard fashion at last clinic visit. Unfortunately she developed [name] wound to the lateral aspect of the foot over the past week. She states she has been on her feet [name] lot more and walking more. She denies systemic signs of infection. 10/9; patient presents for follow-up. Grafix #3 was placed in standard at last clinic visit. The plantar foot wound has healed with this. She developed [name] new wound to the lateral aspect last week and this has gotten larger. She reports soreness to this area. She denies  systemic signs of infection. 10/16; patient presents for follow-up. Her plantar wound continues to remain closed. Her lateral wound has improved with the use of Hydrofera Blue. She has no issues or complaints today. She denies signs of infection. She has almost completed her course of antibiotics prescribed at last clinic visit. [name], [name] ([id number]) [id number] Page 6 of 10 10/23; patient presents for follow-up. She has been using Hydrofera Blue and Medihoney to the wound bed. She has no issues or complaints today. She has been using her surgical shoe. 10/30; patient presents for follow-up. We have been using Hydrofera Blue and Medihoney to the wound bed. Patient has no issues or complaints today. 11/10; patient presents for follow-up. She has been using Hydrofera Blue and Medihoney without issues. She has been wearing her Orthotics. It is unclear if she is offloading this area or not. 11/17; patient presents for follow-up. She has been using Hydrofera Blue and Medihoney to the foot wound without issues. Grafix was available today for placement and patient would like to proceed with this. 12/4; patient presents for follow-up. Grafix was placed at last clinic visit. Her wound is healed. She has no issues or complaints today. She is reported no drainage from the previous wound site. 12/8; patient states that her podiatrist removed [name] callus on the previous wound site to the lateral right foot and there is [name] wound present. She currently denies signs of infection. 12/15; patient presents for follow-up. We have been using Grafix to the wound bed. This was placed in standard fashion today. 12/21; patient presents for follow-up. We have been using Grafix to the wound bed. She completed her oral antibiotics. She has no issues or complaints today. 12/28; patient presents for follow-up. We have been using Grafix to the wound bed. She has reported no drainage. She is  prepared  for the total contact cast today. Her husband is present. 1/4; patient presents for follow-up. We use Grafix under the total contact cast at last clinic visit. Unfortunately her cast protector bag broke and the cast got wet in the shower. Her foot is macerated today. She denies signs of infection. 1/12; patient presents for follow-up. At last clinic visit her foot was macerated due to the total contact cast getting wet. This was not reapplied. She has been doing Medihoney and Hydrofera Blue to the wound bed. She is reported no drainage. 1/19; patient presents for follow-up. Her wound remains closed. She has noted no drainage. 04/14/2022 Patient presents with reopening of the right foot plantar wound. She states it started out as [name] blister. She has been using Medihoney to the wound bed. She saw her podiatrist for this issue and was referred to our clinic. She has had [name] total contact cast for this same wound with success in healing. She currently denies signs of infection. 2/27; patient presents for follow-up. Plans for the total contact cast today. Patient has no issues or complaints she has been using Medihoney and Hydrofera Blue. Patient History Information obtained from Patient, Chart. Family History Unknown History. Social History Current every day smoker - 1 pack/[name]/day, Alcohol Use - Never, Drug Use - No History, Caffeine Use - Moderate. Medical History Eyes Denies history of Cataracts, Glaucoma, Optic Neuritis Ear/Nose/Mouth/Throat Denies history of Chronic sinus problems/congestion, Middle ear problems Hematologic/Lymphatic Denies history of Anemia, Hemophilia, Human Immunodeficiency Virus, Lymphedema, Sickle Cell Disease Respiratory Patient has history of Sleep Apnea Denies history of Aspiration, Asthma, Chronic Obstructive Pulmonary Disease (COPD), Pneumothorax, Tuberculosis Cardiovascular Patient has history of Coronary Artery Disease, Hypertension Denies history of  Angina, Arrhythmia, Congestive Heart Failure, Deep Vein Thrombosis, Hypotension, Myocardial Infarction, Peripheral Arterial Disease, Peripheral Venous Disease, Phlebitis, Vasculitis Gastrointestinal Denies history of Cirrhosis , Colitis, Crohnoos, Hepatitis [name], Hepatitis B, Hepatitis C Endocrine Patient has history of Type II Diabetes Denies history of Type I Diabetes Genitourinary Denies history of End Stage Renal Disease Immunological Denies history of Lupus Erythematosus, Raynaudoos, Scleroderma Integumentary (Skin) Denies history of History of Burn Musculoskeletal Denies history of Gout, Rheumatoid Arthritis, Osteoarthritis, Osteomyelitis Neurologic Patient has history of Neuropathy Denies history of Dementia, Quadriplegia, Paraplegia, Seizure Disorder Hospitalization/Surgery History - 2nd and 3rd right toe amps. - total hysterectomy. - cholecystectomy. - 2 knee surgery's on left and 1 on right. Medical [name] Surgical History Notes nd Cardiovascular hypercholesterolemia [name], [name] ([id number]) [id number] Page 7 of 10 Objective Constitutional respirations regular, non-labored and within target range for patient.. Vitals Time Taken: 10:06 AM, Temperature: 98.1 F, Pulse: 82 bpm, Respiratory Rate: 18 breaths/min, Blood Pressure: 131/80 mmHg, Capillary Blood Glucose: 254 mg/dl. Psychiatric pleasant and cooperative. General Notes: T the right plantar foot there is an open wound with granulation tissue, nonviable tissue and callus. No signs of surrounding infection. o Integumentary (Hair, Skin) Wound #6 status is Open. Original cause of wound was Blister. The date acquired was: 04/14/2022. The wound is located on the [place]. The wound measures 0.3cm length x 0.4cm width x 0.3cm depth; 0.094cm^2 area and 0.028cm^3 volume. There is Fat Layer (Subcutaneous Tissue) exposed. There is no tunneling or undermining noted. There is [name] medium amount  of serosanguineous drainage noted. There is large (67-100%) red granulation within the wound bed. The periwound skin appearance exhibited: Callus. Assessment Active Problems ICD-10 Non-pressure chronic ulcer of other part of right foot with fat layer exposed Type 2 diabetes mellitus with foot ulcer Type  2 diabetes mellitus with diabetic polyneuropathy Patient's wound is stable. I debrided nonviable tissue the total contact cast was placed in standard fashion. Hydrofera Blue and antibiotic ointment was placed under the cast. Follow-up in 2 days for obligatory cast change.. Procedures Wound #6 Pre-procedure diagnosis of Wound #6 is [name] Diabetic Wound/Ulcer of the Lower Extremity located on the [place] .Severity of Tissue Pre Debridement is: Fat layer exposed. There was [name] Excisional Skin/Subcutaneous Tissue Debridement with [name] total area of 0.12 sq cm performed by Kalman Shan, DO. With the following instrument(s): Curette to remove Non-Viable tissue/material. Material removed includes Callus, Subcutaneous Tissue, and Slough. No specimens were taken. [name] time out was conducted at 10:40, prior to the start of the procedure. [name] Minimum amount of bleeding was controlled with Pressure. The procedure was tolerated well with [name] pain level of 0 throughout and [name] pain level of 0 following the procedure. Post Debridement Measurements: 0.3cm length x 0.4cm width x 0.3cm depth; 0.028cm^3 volume. Character of Wound/Ulcer Post Debridement is improved. Severity of Tissue Post Debridement is: Fat layer exposed. Post procedure Diagnosis Wound #6: Same as Pre-Procedure General Notes: Scribed for Dr Heber [place] by Sharyn Creamer, RN. Pre-procedure diagnosis of Wound #6 is [name] Diabetic Wound/Ulcer of the Lower Extremity located on the [place] . There was [name] T Programmer, multimedia otal Procedure by Kalman Shan, DO. Post procedure Diagnosis Wound #6: Same as Pre-Procedure Plan Follow-up  Appointments: Return Appointment in 1 week. - Dr. Heber Virgil - Tuesday and Thursday Plan for cast at next visit then cast change Bathing/ Shower/ Hygiene: May shower with protection but do not get wound dressing(s) wet. Protect dressing(s) with water repellant cover (for example, large plastic bag) or [name] cast cover and may then take shower. WOUND #6: - Foot Wound Laterality: Plantar, Right Cleanser: Wound Cleanser 1 x Per Day/7 Days Discharge Instructions: Cleanse the wound with wound cleanser prior to applying [name] clean dressing using gauze sponges, not tissue or cotton balls. Prim Dressing: Hydrofera Blue Ready Transfer Foam, 2.5x2.5 (in/in) 1 x Per Day/7 Days ary Discharge Instructions: Apply directly to wound bed as directed Prim Dressing: MediHoney Gel, tube 1.5 (oz) 1 x Per Day/7 Days ary Discharge Instructions: Apply to wound bed as instructed Secondary Dressing: Optifoam Non-Adhesive Dressing, 4x4 in 1 x Per Day/7 Days Discharge Instructions: Apply over primary dressing as directed. Secondary Dressing: Woven Gauze Sponge, Non-Sterile 4x4 in 1 x Per Day/7 Days [name], [name] ([id number]) [phone].pdf Page 8 of 10 Discharge Instructions: Apply over primary dressing as directed. Secured With: Scientist, forensic, Sterile 4x75 (in/in) 1 x Per Day/7 Days Discharge Instructions: Secure with stretch gauze as directed. Secured With: Paper Tape, 2x10 (in/yd) 1 x Per Day/7 Days Discharge Instructions: Secure dressing with tape as directed. 1. In office sharp debridement 2. Hydrofera Blue and antibiotic ointment 3. T contact cast placed in standard fashion otal 4. Follow-up later in the week for obligatory cast change Electronic Signature(s) Signed: 04/19/2022 12:29:31 PM By: Kalman Shan DO Entered By: Kalman Shan on 04/19/2022 12:23:45 -------------------------------------------------------------------------------- HxROS  Details Patient Name: Date of Service: [name], [name] 04/19/2022 10:00 [name] M Medical Record Number: [id number] Patient Account Number: [id number] Date of Birth/Sex: Treating RN: [birth year] (68 y.o. F) Primary Care Provider: [name] Other Clinician: Referring Provider: Treating Provider/Extender: [name] in Treatment: 28 Information Obtained From Patient Chart Eyes Medical History: Negative for: Cataracts; Glaucoma; Optic Neuritis Ear/Nose/Mouth/Throat Medical History: Negative for: Chronic  sinus problems/congestion; Middle ear problems Hematologic/Lymphatic Medical History: Negative for: Anemia; Hemophilia; Human Immunodeficiency Virus; Lymphedema; Sickle Cell Disease Respiratory Medical History: Positive for: Sleep Apnea Negative for: Aspiration; Asthma; Chronic Obstructive Pulmonary Disease (COPD); Pneumothorax; Tuberculosis Cardiovascular Medical History: Positive for: Coronary Artery Disease; Hypertension Negative for: Angina; Arrhythmia; Congestive Heart Failure; Deep Vein Thrombosis; Hypotension; Myocardial Infarction; Peripheral Arterial Disease; Peripheral Venous Disease; Phlebitis; Vasculitis Past Medical History Notes: hypercholesterolemia Gastrointestinal Medical History: Negative for: Cirrhosis ; Colitis; Crohns; Hepatitis [name]; Hepatitis B; Hepatitis C Endocrine Medical History: Positive for: Type II Diabetes Negative for: Type I Diabetes Genitourinary Medical History: Negative for: End Stage Renal Disease [name], [name] Destina K ([id number].pdf Page 9 of 10 Immunological Medical History: Negative for: Lupus Erythematosus; Raynauds; Scleroderma Integumentary (Skin) Medical History: Negative for: History of Burn Musculoskeletal Medical History: Negative for: Gout; Rheumatoid Arthritis; Osteoarthritis; Osteomyelitis Neurologic Medical History: Positive for:  Neuropathy Negative for: Dementia; Quadriplegia; Paraplegia; Seizure Disorder Immunizations Pneumococcal Vaccine: Received Pneumococcal Vaccination: Yes Received Pneumococcal Vaccination On or After 60th Birthday: Yes Tetanus Vaccine: Last tetanus shot: 12/17/2020 Implantable Devices No devices added Hospitalization / Surgery History Type of Hospitalization/Surgery 2nd and 3rd right toe amps total hysterectomy cholecystectomy 2 knee surgery's on left and 1 on right Family and Social History Unknown History: Yes; Current every day smoker - 1 pack/[name]/day; Alcohol Use: Never; Drug Use: No History; Caffeine Use: Moderate; Financial Concerns: No; Food, Clothing or Shelter Needs: No; Support System Lacking: No; Transportation Concerns: No Electronic Signature(s) Signed: 04/19/2022 12:29:31 PM By: Kalman Shan DO Entered By: Kalman Shan on 04/19/2022 12:19:56 -------------------------------------------------------------------------------- Total Contact Cast Details Patient Name: Date of Service: [name], [name] 04/19/2022 10:00 [name] M Medical Record Number: [id number] Patient Account Number: [id number] Date of Birth/Sex: Treating RN: 1955/04/30 (67 y.o. [name] Primary Care Provider: [name] Other Clinician: Referring Provider: Treating Provider/Extender: [name] in Treatment: 37 T Contact Cast Applied for Wound Assessment: otal Wound #6 Right,Plantar Foot Performed By: Physician Kalman Shan, DO Post Procedure Diagnosis Same as Pre-procedure Electronic Signature(s) Signed: 04/19/2022 12:29:31 PM By: Kalman Shan DO Signed: 04/19/2022 4:02:57 PM By: Sharyn Creamer RN, BSN Entered By: Sharyn Creamer on 04/19/2022 10:45:12 Victorino, Virl [name] ([id number].pdf Page 10 of 10 -------------------------------------------------------------------------------- SuperBill  Details Patient Name: Date of Service: [name], [name] 04/19/2022 Medical Record Number: [id number] Patient Account Number: [id number] Date of Birth/Sex: Treating RN: [birth year] (67 y.o. F) Primary Care Provider: [name] Other Clinician: Referring Provider: Treating Provider/Extender: [name] in Treatment: 28 Diagnosis Coding ICD-10 Codes Code Description [phone] Non-pressure chronic ulcer of other part of right foot with fat layer exposed E11.621 Type 2 diabetes mellitus with foot ulcer E11.42 Type 2 diabetes mellitus with diabetic polyneuropathy Facility Procedures : CPT4 Code: [id number] Description: [id number] - DEB SUBQ TISSUE 20 SQ CM/< ICD-10 Diagnosis Description L97.512 Non-pressure chronic ulcer of other part of right foot with fat layer exposed E11.621 Type 2 diabetes mellitus with foot ulcer E11.42 Type 2 diabetes mellitus with  diabetic polyneuropathy Modifier: Quantity: 1 Physician Procedures : CPT4 Code Description Modifier [id number] - WC PHYS SUBQ TISS 20 SQ CM ICD-10 Diagnosis Description L97.512 Non-pressure chronic ulcer of other part of right foot with fat layer exposed E11.621 Type 2 diabetes mellitus with foot ulcer E11.42 Type 2  diabetes mellitus with diabetic polyneuropathy Quantity: 1 Electronic Signature(s) Signed: 04/19/2022 12:29:31 PM By: Kalman Shan DO Entered By: Kalman Shan on 04/19/2022 12:24:21

## 2022-04-20 NOTE — Progress Notes (Signed)
ZAIDYN, BARTUNEK ([id number]) [id number] Page 1 of 10 Visit Report for 04/14/2022 Chief Complaint Document Details Patient Name: Date of Service: [name], [name] 04/14/2022 1:30 PM Medical Record Number: [id number] Patient Account Number: [id number] Date of Birth/Sex: Treating RN: 18-Jan-1956 (67 y.o. F) Primary Care Provider: [name] Other Clinician: Referring Provider: Treating Provider/Extender: [name] in Treatment: 27 Information Obtained from: Patient Chief Complaint 10/27; Right Plantar foot wound 11/21; 2 small areas of skin breakdown to the right foot following cast placement 8/29; right plantar foot wound Electronic Signature(s) Signed: 04/14/2022 4:16:06 PM By: Kalman Shan DO Entered By: Kalman Shan on 04/14/2022 14:24:45 -------------------------------------------------------------------------------- Debridement Details Patient Name: Date of Service: [name], [name] 04/14/2022 1:30 PM Medical Record Number: [id number] Patient Account Number: [id number] Date of Birth/Sex: Treating RN: 09/12/1955 (67 y.o. [name] Primary Care Provider: [name] Other Clinician: Referring Provider: Treating Provider/Extender: [name] in Treatment: 27 Debridement Performed for Assessment: Wound #6 Right,Plantar Foot Performed By: Physician Kalman Shan, DO Debridement Type: Debridement Severity of Tissue Pre Debridement: Fat layer exposed Level of Consciousness (Pre-procedure): Awake and Alert Pre-procedure Verification/Time Out Yes - 14:00 Taken: Start Time: 14:03 T Area Debrided (L x W): otal 0.3 (cm) x 0.3 (cm) = 0.09 (cm) Tissue and other material debrided: Non-Viable, Callus, Slough, Subcutaneous, Slough Level: Skin/Subcutaneous Tissue Debridement Description: Excisional Instrument: Curette Bleeding: Minimum Hemostasis  Achieved: Pressure Procedural Pain: 0 Post Procedural Pain: 0 Response to Treatment: Procedure was tolerated well Level of Consciousness (Post- Awake and Alert procedure): Post Debridement Measurements of Total Wound Length: (cm) 0.3 Width: (cm) 0.3 Depth: (cm) 0.3 Volume: (cm) 0.021 Character of Wound/Ulcer Post Debridement: Improved Severity of Tissue Post Debridement: Fat layer exposed Post Procedure Diagnosis Same as Pre-procedure Notes Scribed for Dr Heber [place] by Sharyn Creamer, RN Electronic Signature(s) Signed: 04/14/2022 4:16:06 PM By: Chiquita Loth, [place] ([id number]) [id number] Page 2 of 10 Signed: 04/19/2022 4:02:57 PM By: Sharyn Creamer RN, BSN Entered By: Sharyn Creamer on 04/14/2022 14:04:40 -------------------------------------------------------------------------------- HPI Details Patient Name: Date of Service: [place], [name] 04/14/2022 1:30 PM Medical Record Number: [id number] Patient Account Number: [id number] Date of Birth/Sex: Treating RN: 1955/07/27 (67 y.o. F) Primary Care Provider: [name] Other Clinician: Referring Provider: Treating Provider/Extender: [name] in Treatment: 27 History of Present Illness HPI Description: Admission 12/17/2020 [name] is [name] 67 year old female with [name] past medical history of insulin-dependent type 2 diabetes, hypothyroidism and daily1 pack per day cigarette smoker the presents to the clinic for [name] 6-week history of nonhealing wound to the right first MTPJ. She has been following with [name], podiatry for this issue. She has been using silver alginate with dressing changes. She uses [name] postsurgical shoe and offloading pads. She currently denies signs of infection. 10/31; patient presents for follow-up. She tolerated the soft cast fine although she states that she felt her foot rolling to one side. She denies signs  of infection. She would [name] to do the total contact cast today. 12/23/2020 upon evaluation today patient appears to be doing excellent in regard to her wound on the foot and she is in [name] total contact cast. I do think this is appropriate this is the first cast change which we are obliged to do to ensure nothing is rubbing everything appears to be doing quite well and very pleased in that regard. 11/7; patient  presents for follow-up. She had no issues with the cast. She denies signs of infection. 11/14; patient presents for 1 week follow-up. She has had no issues with the cast. She denies signs of infection. 11/21; patient presents for 1 week follow-up. She did develop 2 small areas of skin breakdown on either side of the right foot from the cast rubbing. She states she did not feel the wounds developing. She currently denies signs of infection. 11/28; patient presents for 1 week follow-up. She has no issues or complaints today. She denies pain or acute signs of infection. 12/12; patient presents for follow-up. She reports improvement to her right lateral foot wound. She has been using silver alginate to the area. She has no issues or complaints today. 12/19; patient presents for follow-up. She reports that her right lateral foot wound has healed. She has no issues or complaints today. 1/3; patient presents for follow-up. She has no issues or complaints today. She reports no open wounds. Readmission 10/01/2021 [name] is [name] 67 year old female's with [name] past medical history of insulin-dependent controlled type 2 diabetes complicated by peripheral neuropathy that presents to the clinic for [name] 35-monthhistory of nonhealing ulcer to the right foot. I have seen her before for [name] wound to the same area that was treated and healed. Her wound today however this is much deeper and it has thick yellow drainage. She is not sure exactly how it started. She noticed it 1 day. She has been following with Dr.  EAmalia Haileyfor this issue. She had [name] wound culture that showed [name] mix of organisms on 09/01/2021. She was recently started on doxycycline. She is using the dConservation officer, nature She has been using Iodosorb with dressing changes. She currently denies systemic signs of infection. 8/21; patient presents for follow-up. She states she started and completed levofloxacin. She is still taking doxycycline. She reports improvement in drainage and odor. She has been doing Dakin's wet-to-dry dressings as well. She is using her front offloading shoe. She denies signs of infection. 8/29; patient presents for follow-up. She is still taking doxycycline. She continues to report improvement in drainage and reports no odor. She denies any purulent drainage. She has been doing silver alginate to the wound bed and using her front offloading shoe. 9/5; patient presents for follow-up. She completed the course of levofloxacin. She has someone that is able to drive her today so we can place the total contact cast. She reports no signs of infection. We have been doing silver alginate to the wound bed. 9/7; patient presents for follow-up. She has tolerated the total contact cast well. She presents for her obligatory cast change. She has no issues or complaints today. 9/11; patient presents for follow-up. She tolerated the total contact cast well. She has no issues or complaints today. We discussed potentially doing [name] skin substitute and patient would [name] to see if her insurance will cover this. 9/18; patient presents for follow-up. She had no issues with the total contact cast. She been approved for Grafix and patient would [name] to have this placed today. 9/25; patient presents for follow-up. Grafix #1 was placed in standard fashion at last clinic visit. She has no issues or complaints today. She tolerated the cast well. 10/2; patient presents for follow-up. Grafix #2 was placed in standard fashion at last clinic visit. Unfortunately she  developed [name] wound to the lateral aspect of the foot over the past week. She states she has been on her feet [name] lot more  and walking more. She denies systemic signs of infection. 10/9; patient presents for follow-up. Grafix #3 was placed in standard at last clinic visit. The plantar foot wound has healed with this. She developed [name] new wound to the lateral aspect last week and this has gotten larger. She reports soreness to this area. She denies systemic signs of infection. 10/16; patient presents for follow-up. Her plantar wound continues to remain closed. Her lateral wound has improved with the use of Hydrofera Blue. She has no issues or complaints today. She denies signs of infection. She has almost completed her course of antibiotics prescribed at last clinic visit. 10/23; patient presents for follow-up. She has been using Hydrofera Blue and Medihoney to the wound bed. She has no issues or complaints today. She has been using her surgical shoe. SHANEECE, INSKO ([id number]) [id number] Page 3 of 10 10/30; patient presents for follow-up. We have been using Hydrofera Blue and Medihoney to the wound bed. Patient has no issues or complaints today. 11/10; patient presents for follow-up. She has been using Hydrofera Blue and Medihoney without issues. She has been wearing her Orthotics. It is unclear if she is offloading this area or not. 11/17; patient presents for follow-up. She has been using Hydrofera Blue and Medihoney to the foot wound without issues. Grafix was available today for placement and patient would [name] to proceed with this. 12/4; patient presents for follow-up. Grafix was placed at last clinic visit. Her wound is healed. She has no issues or complaints today. She is reported no drainage from the previous wound site. 12/8; patient states that her podiatrist removed [name] callus on the previous wound site to the lateral right foot and there is [name] wound present. She  currently denies signs of infection. 12/15; patient presents for follow-up. We have been using Grafix to the wound bed. This was placed in standard fashion today. 12/21; patient presents for follow-up. We have been using Grafix to the wound bed. She completed her oral antibiotics. She has no issues or complaints today. 12/28; patient presents for follow-up. We have been using Grafix to the wound bed. She has reported no drainage. She is prepared for the total contact cast today. Her husband is present. 1/4; patient presents for follow-up. We use Grafix under the total contact cast at last clinic visit. Unfortunately her cast protector bag broke and the cast got wet in the shower. Her foot is macerated today. She denies signs of infection. 1/12; patient presents for follow-up. At last clinic visit her foot was macerated due to the total contact cast getting wet. This was not reapplied. She has been doing Medihoney and Hydrofera Blue to the wound bed. She is reported no drainage. 1/19; patient presents for follow-up. Her wound remains closed. She has noted no drainage. 04/14/2022 Patient presents with reopening of the right foot plantar wound. She states it started out as [name] blister. She has been using Medihoney to the wound bed. She saw her podiatrist for this issue and was referred to our clinic. She has had [name] total contact cast for this same wound with success in healing. She currently denies signs of infection. Electronic Signature(s) Signed: 04/14/2022 4:16:06 PM By: Kalman Shan DO Entered By: Kalman Shan on 04/14/2022 14:26:38 -------------------------------------------------------------------------------- Physical Exam Details Patient Name: Date of Service: [name], [name] 04/14/2022 1:30 PM Medical Record Number: [id number] Patient Account Number: [id number] Date of Birth/Sex: Treating RN: 24-Aug-1955 (67 y.o. F) Primary Care Provider: [name] Other  Clinician: Referring Provider: Treating Provider/Extender: [name] in Treatment: 27 Constitutional respirations regular, non-labored and within target range for patient.. Cardiovascular 2+ dorsalis pedis/posterior tibialis pulses. Psychiatric pleasant and cooperative. Notes T the right plantar foot there is an open wound with granulation tissue, nonviable tissue and callus. No signs of surrounding infection. o Electronic Signature(s) Signed: 04/14/2022 4:16:06 PM By: Kalman Shan DO Entered By: Kalman Shan on 04/14/2022 14:27:42 -------------------------------------------------------------------------------- Physician Orders Details Patient Name: Date of Service: [name], [name] 04/14/2022 1:30 PM Medical Record Number: [id number] Patient Account Number: [id number] Date of Birth/Sex: Treating RN: Feb 13, 1956 (67 y.o. [name] Primary Care Provider: [name] Other Clinician: Referring Provider: Treating Provider/Extender: [name] in Treatment: 65 Verbal / Phone Orders: No [name], [name] ([id number]) [id number] Page 4 of 10 Diagnosis Coding Follow-up Appointments ppointment in 1 week. - Dr. Heber [place] - Tuesday and Thursday Return [name] Plan for cast at next visit then cast change Bathing/ Shower/ Hygiene May shower with protection but do not get wound dressing(s) wet. Protect dressing(s) with water repellant cover (for example, large plastic bag) or [name] cast cover and may then take shower. Wound Treatment Wound #6 - Foot Wound Laterality: Plantar, Right Cleanser: Wound Cleanser 1 x Per Day/7 Days Discharge Instructions: Cleanse the wound with wound cleanser prior to applying [name] clean dressing using gauze sponges, not tissue or cotton balls. Prim Dressing: Hydrofera Blue Ready Transfer Foam, 2.5x2.5 (in/in) 1 x Per Day/7 Days ary Discharge Instructions:  Apply directly to wound bed as directed Prim Dressing: MediHoney Gel, tube 1.5 (oz) 1 x Per Day/7 Days ary Discharge Instructions: Apply to wound bed as instructed Secondary Dressing: Optifoam Non-Adhesive Dressing, 4x4 in 1 x Per Day/7 Days Discharge Instructions: Apply over primary dressing as directed. Secondary Dressing: Woven Gauze Sponge, Non-Sterile 4x4 in 1 x Per Day/7 Days Discharge Instructions: Apply over primary dressing as directed. Secured With: Scientist, forensic, Sterile 4x75 (in/in) 1 x Per Day/7 Days Discharge Instructions: Secure with stretch gauze as directed. Secured With: Paper Tape, 2x10 (in/yd) 1 x Per Day/7 Days Discharge Instructions: Secure dressing with tape as directed. Electronic Signature(s) Signed: 04/14/2022 4:16:06 PM By: Kalman Shan DO Entered By: Kalman Shan on 04/14/2022 14:27:50 -------------------------------------------------------------------------------- Problem List Details Patient Name: Date of Service: [name], [name] 04/14/2022 1:30 PM Medical Record Number: [id number] Patient Account Number: [id number] Date of Birth/Sex: Treating RN: 1955-12-22 (67 y.o. F) Primary Care Provider: [name] Other Clinician: Referring Provider: Treating Provider/Extender: [name] in Treatment: 27 Active Problems ICD-10 Encounter Code Description Active Date MDM Diagnosis L97.512 Non-pressure chronic ulcer of other part of right foot with fat layer exposed 10/01/2021 No Yes E11.621 Type 2 diabetes mellitus with foot ulcer 10/01/2021 No Yes E11.42 Type 2 diabetes mellitus with diabetic polyneuropathy 10/01/2021 No Yes Inactive Problems Resolved Problems Electronic Signature(s) [name], [name] ([id number]) [id number] Page 5 of 10 Signed: 04/14/2022 4:16:06 PM By: Kalman Shan DO Entered By: Kalman Shan on 04/14/2022  14:24:28 -------------------------------------------------------------------------------- Progress Note Details Patient Name: Date of Service: [name], [name] 04/14/2022 1:30 PM Medical Record Number: [id number] Patient Account Number: [id number] Date of Birth/Sex: Treating RN: 04/14/1955 (67 y.o. F) Primary Care Provider: [name] Other Clinician: Referring Provider: Treating Provider/Extender: [name] in Treatment: 27 Subjective Chief Complaint Information obtained from Patient 10/27; Right Plantar foot wound 11/21; 2 small  areas of skin breakdown to the right foot following cast placement 8/29; right plantar foot wound History of Present Illness (HPI) Admission 12/17/2020 [name] with [name] past medical history of insulin-dependent type 2 diabetes, hypothyroidism and daily1 pack per day cigarette smoker the presents to the clinic for [name] 6-week history of nonhealing wound to the right first MTPJ. She has been following with [name], podiatry for this issue. She has been using silver alginate with dressing changes. She uses [name] postsurgical shoe and offloading pads. She currently denies signs of infection. 10/31; patient presents for follow-up. She tolerated the soft cast fine although she states that she felt her foot rolling to one side. She denies signs of infection. She would [name] to do the total contact cast today. 12/23/2020 upon evaluation today patient appears to be doing excellent in regard to her wound on the foot and she is in [name] total contact cast. I do think this is appropriate this is the first cast change which we are obliged to do to ensure nothing is rubbing everything appears to be doing quite well and very pleased in that regard. 11/7; patient presents for follow-up. She had no issues with the cast. She denies signs of infection. 11/14; patient presents for 1 week follow-up. She has had no  issues with the cast. She denies signs of infection. 11/21; patient presents for 1 week follow-up. She did develop 2 small areas of skin breakdown on either side of the right foot from the cast rubbing. She states she did not feel the wounds developing. She currently denies signs of infection. 11/28; patient presents for 1 week follow-up. She has no issues or complaints today. She denies pain or acute signs of infection. 12/12; patient presents for follow-up. She reports improvement to her right lateral foot wound. She has been using silver alginate to the area. She has no issues or complaints today. 12/19; patient presents for follow-up. She reports that her right lateral foot wound has healed. She has no issues or complaints today. 1/3; patient presents for follow-up. She has no issues or complaints today. She reports no open wounds. Readmission 10/01/2021 [name]'s with [name] past medical history of insulin-dependent controlled type 2 diabetes complicated by peripheral neuropathy that presents to the clinic for [name] 6-monthhistory of nonhealing ulcer to the right foot. I have seen her before for [name] wound to the same area that was treated and healed. Her wound today however this is much deeper and it has thick yellow drainage. She is not sure exactly how it started. She noticed it 1 day. She has been following with Dr. EAmalia Haileyfor this issue. She had [name] wound culture that showed [name] mix of organisms on 09/01/2021. She was recently started on doxycycline. She is using the dConservation officer, nature She has been using Iodosorb with dressing changes. She currently denies systemic signs of infection. 8/21; patient presents for follow-up. She states she started and completed levofloxacin. She is still taking doxycycline. She reports improvement in drainage and odor. She has been doing Dakin's wet-to-dry dressings as well. She is using her front offloading shoe. She denies signs of  infection. 8/29; patient presents for follow-up. She is still taking doxycycline. She continues to report improvement in drainage and reports no odor. She denies any purulent drainage. She has been doing silver alginate to the wound bed and using her front offloading shoe. 9/5; patient presents for follow-up. She completed the  course of levofloxacin. She has someone that is able to drive her today so we can place the total contact cast. She reports no signs of infection. We have been doing silver alginate to the wound bed. 9/7; patient presents for follow-up. She has tolerated the total contact cast well. She presents for her obligatory cast change. She has no issues or complaints today. 9/11; patient presents for follow-up. She tolerated the total contact cast well. She has no issues or complaints today. We discussed potentially doing [name] skin substitute and patient would [name] to see if her insurance will cover this. 9/18; patient presents for follow-up. She had no issues with the total contact cast. She been approved for Grafix and patient would [name] to have this placed today. 9/25; patient presents for follow-up. Grafix #1 was placed in standard fashion at last clinic visit. She has no issues or complaints today. She tolerated the cast well. 10/2; patient presents for follow-up. Grafix #2 was placed in standard fashion at last clinic visit. Unfortunately she developed [name] wound to the lateral aspect of the foot over the past week. She states she has been on her feet [name] lot more and walking more. She denies systemic signs of infection. 10/9; patient presents for follow-up. Grafix #3 was placed in standard at last clinic visit. The plantar foot wound has healed with this. She developed [name] new wound to the lateral aspect last week and this has gotten larger. She reports soreness to this area. She denies systemic signs of infection. 10/16; patient presents for follow-up. Her plantar wound continues to  remain closed. Her lateral wound has improved with the use of Hydrofera Blue. She has no issues or complaints today. She denies signs of infection. She has almost completed her course of antibiotics prescribed at last clinic visit. [name], [name] ([id number]) [id number] Page 6 of 10 10/23; patient presents for follow-up. She has been using Hydrofera Blue and Medihoney to the wound bed. She has no issues or complaints today. She has been using her surgical shoe. 10/30; patient presents for follow-up. We have been using Hydrofera Blue and Medihoney to the wound bed. Patient has no issues or complaints today. 11/10; patient presents for follow-up. She has been using Hydrofera Blue and Medihoney without issues. She has been wearing her Orthotics. It is unclear if she is offloading this area or not. 11/17; patient presents for follow-up. She has been using Hydrofera Blue and Medihoney to the foot wound without issues. Grafix was available today for placement and patient would [name] to proceed with this. 12/4; patient presents for follow-up. Grafix was placed at last clinic visit. Her wound is healed. She has no issues or complaints today. She is reported no drainage from the previous wound site. 12/8; patient states that her podiatrist removed [name] callus on the previous wound site to the lateral right foot and there is [name] wound present. She currently denies signs of infection. 12/15; patient presents for follow-up. We have been using Grafix to the wound bed. This was placed in standard fashion today. 12/21; patient presents for follow-up. We have been using Grafix to the wound bed. She completed her oral antibiotics. She has no issues or complaints today. 12/28; patient presents for follow-up. We have been using Grafix to the wound bed. She has reported no drainage. She is prepared for the total contact cast today. Her husband is present. 1/4; patient presents for follow-up.  We use Grafix under the total contact cast at last clinic visit.  Unfortunately her cast protector bag broke and the cast got wet in the shower. Her foot is macerated today. She denies signs of infection. 1/12; patient presents for follow-up. At last clinic visit her foot was macerated due to the total contact cast getting wet. This was not reapplied. She has been doing Medihoney and Hydrofera Blue to the wound bed. She is reported no drainage. 1/19; patient presents for follow-up. Her wound remains closed. She has noted no drainage. 04/14/2022 Patient presents with reopening of the right foot plantar wound. She states it started out as [name] blister. She has been using Medihoney to the wound bed. She saw her podiatrist for this issue and was referred to our clinic. She has had [name] total contact cast for this same wound with success in healing. She currently denies signs of infection. Patient History Information obtained from Patient, Chart. Family History Unknown History. Social History Current every day smoker - 1 pack/[name]/day, Alcohol Use - Never, Drug Use - No History, Caffeine Use - Moderate. Medical History Eyes Denies history of Cataracts, Glaucoma, Optic Neuritis Ear/Nose/Mouth/Throat Denies history of Chronic sinus problems/congestion, Middle ear problems Hematologic/Lymphatic Denies history of Anemia, Hemophilia, Human Immunodeficiency Virus, Lymphedema, Sickle Cell Disease Respiratory Patient has history of Sleep Apnea Denies history of Aspiration, Asthma, Chronic Obstructive Pulmonary Disease (COPD), Pneumothorax, Tuberculosis Cardiovascular Patient has history of Coronary Artery Disease, Hypertension Denies history of Angina, Arrhythmia, Congestive Heart Failure, Deep Vein Thrombosis, Hypotension, Myocardial Infarction, Peripheral Arterial Disease, Peripheral Venous Disease, Phlebitis, Vasculitis Gastrointestinal Denies history of Cirrhosis , Colitis, Crohnoos, Hepatitis [name],  Hepatitis B, Hepatitis C Endocrine Patient has history of Type II Diabetes Denies history of Type I Diabetes Genitourinary Denies history of End Stage Renal Disease Immunological Denies history of Lupus Erythematosus, Raynaudoos, Scleroderma Integumentary (Skin) Denies history of History of Burn Musculoskeletal Denies history of Gout, Rheumatoid Arthritis, Osteoarthritis, Osteomyelitis Neurologic Patient has history of Neuropathy Denies history of Dementia, Quadriplegia, Paraplegia, Seizure Disorder Hospitalization/Surgery History - 2nd and 3rd right toe amps. - total hysterectomy. - cholecystectomy. - 2 knee surgery's on left and 1 on right. Medical [name] Surgical History Notes nd Cardiovascular hypercholesterolemia [name], [name] ([id number]) [id number] Page 7 of 10 Objective Constitutional respirations regular, non-labored and within target range for patient.. Vitals Time Taken: 1:30 PM, Temperature: 97.6 F, Pulse: 83 bpm, Respiratory Rate: 18 breaths/min, Blood Pressure: 122/76 mmHg, Capillary Blood Glucose: 130 mg/dl. Cardiovascular 2+ dorsalis pedis/posterior tibialis pulses. Psychiatric pleasant and cooperative. General Notes: T the right plantar foot there is an open wound with granulation tissue, nonviable tissue and callus. No signs of surrounding infection. o Integumentary (Hair, Skin) Wound #6 status is Open. Original cause of wound was Blister. The date acquired was: 04/14/2022. The wound is located on the [place]. The wound measures 0.3cm length x 0.3cm width x 0.3cm depth; 0.071cm^2 area and 0.021cm^3 volume. There is Fat Layer (Subcutaneous Tissue) exposed. There is no tunneling or undermining noted. There is [name] medium amount of serosanguineous drainage noted. There is large (67-100%) red granulation within the wound bed. The periwound skin appearance exhibited: Callus. Assessment Active Problems ICD-10 Non-pressure  chronic ulcer of other part of right foot with fat layer exposed Type 2 diabetes mellitus with foot ulcer Type 2 diabetes mellitus with diabetic polyneuropathy Patient has developed [name] new wound to the plantar right foot. This is secondary to type 2 diabetes complicated by peripheral neuropathy. No signs of infection. Have treated her on 2 separate admissions for wound to the same area. We have  done [name] total contact cast with benefit before. We will plan for [name] total contact cast at next clinic visit. I debrided nonviable tissue. For now I recommended Hydrofera Blue and Medihoney and aggressive offloading with her surgical shoe and peg assist. Procedures Wound #6 Pre-procedure diagnosis of Wound #6 is [name] Diabetic Wound/Ulcer of the Lower Extremity located on the [place] .Severity of Tissue Pre Debridement is: Fat layer exposed. There was [name] Excisional Skin/Subcutaneous Tissue Debridement with [name] total area of 0.09 sq cm performed by Kalman Shan, DO. With the following instrument(s): Curette to remove Non-Viable tissue/material. Material removed includes Callus, Subcutaneous Tissue, and Slough. No specimens were taken. [name] time out was conducted at 14:00, prior to the start of the procedure. [name] Minimum amount of bleeding was controlled with Pressure. The procedure was tolerated well with [name] pain level of 0 throughout and [name] pain level of 0 following the procedure. Post Debridement Measurements: 0.3cm length x 0.3cm width x 0.3cm depth; 0.021cm^3 volume. Character of Wound/Ulcer Post Debridement is improved. Severity of Tissue Post Debridement is: Fat layer exposed. Post procedure Diagnosis Wound #6: Same as Pre-Procedure General Notes: Scribed for Dr Heber [place] by Sharyn Creamer, RN. Plan Follow-up Appointments: Return Appointment in 1 week. - Dr. Heber [place] - Tuesday and Thursday Plan for cast at next visit then cast change Bathing/ Shower/ Hygiene: May shower with protection but do not get wound  dressing(s) wet. Protect dressing(s) with water repellant cover (for example, large plastic bag) or [name] cast cover and may then take shower. WOUND #6: - Foot Wound Laterality: Plantar, Right Cleanser: Wound Cleanser 1 x Per Day/7 Days Discharge Instructions: Cleanse the wound with wound cleanser prior to applying [name] clean dressing using gauze sponges, not tissue or cotton balls. Prim Dressing: Hydrofera Blue Ready Transfer Foam, 2.5x2.5 (in/in) 1 x Per Day/7 Days ary Discharge Instructions: Apply directly to wound bed as directed Prim Dressing: MediHoney Gel, tube 1.5 (oz) 1 x Per Day/7 Days ary Discharge Instructions: Apply to wound bed as instructed Secondary Dressing: Optifoam Non-Adhesive Dressing, 4x4 in 1 x Per Day/7 Days Discharge Instructions: Apply over primary dressing as directed. Secondary Dressing: Woven Gauze Sponge, Non-Sterile 4x4 in 1 x Per Day/7 Days Discharge Instructions: Apply over primary dressing as directed. Secured With: Scientist, forensic, Sterile 4x75 (in/in) 1 x Per Day/7 Days Discharge Instructions: Secure with stretch gauze as directed. [name], MALLIK ([id number]) [id number] Page 8 of 10 Secured With: Paper Tape, 2x10 (in/yd) 1 x Per Day/7 Days Discharge Instructions: Secure dressing with tape as directed. 1. In office sharp debridement 2. Medihoney and Hydrofera Blue 3. Aggressive offloadingoosurgical shoe with peg assist 4. Follow-up next week and plan is for the total contact cast Electronic Signature(s) Signed: 04/14/2022 4:16:06 PM By: Kalman Shan DO Entered By: Kalman Shan on 04/14/2022 14:29:29 -------------------------------------------------------------------------------- HxROS Details Patient Name: Date of Service: [place], [name] 04/14/2022 1:30 PM Medical Record Number: [id number] Patient Account Number: [id number] Date of Birth/Sex: Treating RN: 12-27-1955 (67 y.o. F) Primary  Care Provider: [name] Other Clinician: Referring Provider: Treating Provider/Extender: [name] in Treatment: 27 Information Obtained From Patient Chart Eyes Medical History: Negative for: Cataracts; Glaucoma; Optic Neuritis Ear/Nose/Mouth/Throat Medical History: Negative for: Chronic sinus problems/congestion; Middle ear problems Hematologic/Lymphatic Medical History: Negative for: Anemia; Hemophilia; Human Immunodeficiency Virus; Lymphedema; Sickle Cell Disease Respiratory Medical History: Positive for: Sleep Apnea Negative for: Aspiration; Asthma; Chronic Obstructive Pulmonary Disease (COPD); Pneumothorax; Tuberculosis Cardiovascular Medical History:  Positive for: Coronary Artery Disease; Hypertension Negative for: Angina; Arrhythmia; Congestive Heart Failure; Deep Vein Thrombosis; Hypotension; Myocardial Infarction; Peripheral Arterial Disease; Peripheral Venous Disease; Phlebitis; Vasculitis Past Medical History Notes: hypercholesterolemia Gastrointestinal Medical History: Negative for: Cirrhosis ; Colitis; Crohns; Hepatitis [name]; Hepatitis B; Hepatitis C Endocrine Medical History: Positive for: Type II Diabetes Negative for: Type I Diabetes Genitourinary Medical History: Negative for: End Stage Renal Disease Immunological [name], [name] ([id number]) [id number] Page 9 of 10 Medical History: Negative for: Lupus Erythematosus; Raynauds; Scleroderma Integumentary (Skin) Medical History: Negative for: History of Burn Musculoskeletal Medical History: Negative for: Gout; Rheumatoid Arthritis; Osteoarthritis; Osteomyelitis Neurologic Medical History: Positive for: Neuropathy Negative for: Dementia; Quadriplegia; Paraplegia; Seizure Disorder Immunizations Pneumococcal Vaccine: Received Pneumococcal Vaccination: Yes Received Pneumococcal Vaccination On or After 60th Birthday: Yes Tetanus  Vaccine: Last tetanus shot: 12/17/2020 Implantable Devices No devices added Hospitalization / Surgery History Type of Hospitalization/Surgery 2nd and 3rd right toe amps total hysterectomy cholecystectomy 2 knee surgery's on left and 1 on right Family and Social History Unknown History: Yes; Current every day smoker - 1 pack/[name]/day; Alcohol Use: Never; Drug Use: No History; Caffeine Use: Moderate; Financial Concerns: No; Food, Clothing or Shelter Needs: No; Support System Lacking: No; Transportation Concerns: No Electronic Signature(s) Signed: 04/14/2022 4:16:06 PM By: Kalman Shan DO Entered By: Kalman Shan on 04/14/2022 14:26:47 -------------------------------------------------------------------------------- SuperBill Details Patient Name: Date of Service: [name], [name] 04/14/2022 Medical Record Number: [id number] Patient Account Number: [id number] Date of Birth/Sex: Treating RN: May 28, 1955 (67 y.o. F) Primary Care Provider: [name] Other Clinician: Referring Provider: Treating Provider/Extender: [name] in Treatment: 27 Diagnosis Coding ICD-10 Codes Code Description [phone] Non-pressure chronic ulcer of other part of right foot with fat layer exposed E11.621 Type 2 diabetes mellitus with foot ulcer E11.42 Type 2 diabetes mellitus with diabetic polyneuropathy Facility Procedures : JANANI, LANGWELL Code: [id number] [place] K [phone] Description: 11042 - DEB SUBQ TISSUE 20 SQ CM/< ICD-10 Diagnosis Description L97.512 Non-pressure chronic ulcer of other part of right foot with fat layer exposed E11.621 Type 2 diabetes mellitus with foot ulcer E11.42 Type 2 diabetes mellitus with  diabetic polyneuropathy 729) [id number] Modifier: [id number] Quantity: 1 7.pdf Page 10 of 10 Physician Procedures : CPT4 Code Description Modifier [id number] - WC PHYS LEVEL 3 - EST PT ICD-10 Diagnosis Description L97.512  Non-pressure chronic ulcer of other part of right foot with fat layer exposed E11.621 Type 2 diabetes mellitus with foot ulcer E11.42 Type 2  diabetes mellitus with diabetic polyneuropathy Quantity: 1 : [id number] 11042 - WC PHYS SUBQ TISS 20 SQ CM ICD-10 Diagnosis Description L97.512 Non-pressure chronic ulcer of other part of right foot with fat layer exposed E11.621 Type 2 diabetes mellitus with foot ulcer E11.42 Type 2 diabetes mellitus with diabetic  polyneuropathy Quantity: 1 Electronic Signature(s) Signed: 04/14/2022 4:16:06 PM By: Kalman Shan DO Entered By: Kalman Shan on 04/14/2022 14:29:53

## 2022-04-21 ENCOUNTER — Encounter (HOSPITAL_BASED_OUTPATIENT_CLINIC_OR_DEPARTMENT_OTHER): Payer: Medicare Other | Admitting: Internal Medicine

## 2022-04-21 DIAGNOSIS — E039 Hypothyroidism, unspecified: Secondary | ICD-10-CM | POA: Diagnosis not present

## 2022-04-21 DIAGNOSIS — I10 Essential (primary) hypertension: Secondary | ICD-10-CM | POA: Diagnosis not present

## 2022-04-21 DIAGNOSIS — E11621 Type 2 diabetes mellitus with foot ulcer: Secondary | ICD-10-CM | POA: Diagnosis not present

## 2022-04-21 DIAGNOSIS — Z794 Long term (current) use of insulin: Secondary | ICD-10-CM | POA: Diagnosis not present

## 2022-04-21 DIAGNOSIS — L97512 Non-pressure chronic ulcer of other part of right foot with fat layer exposed: Secondary | ICD-10-CM

## 2022-04-21 DIAGNOSIS — E1142 Type 2 diabetes mellitus with diabetic polyneuropathy: Secondary | ICD-10-CM | POA: Diagnosis not present

## 2022-04-24 NOTE — Progress Notes (Signed)
[name] ([id number]) [id number] Page 1 of 10 Visit Report for 04/21/2022 Chief Complaint Document Details Patient Name: Date of Service: [name] 04/21/2022 9:00 [name] Medical Record Number: [id number] Patient Account Number: [id number] Date of Birth/Sex: Treating RN: 05/29/1955 (67 y.o. F) Primary Care Provider: Geoffery [name] Other Clinician: Referring Provider: Treating Provider/Extender: [name] in Treatment: 28 Information Obtained from: Patient Chief Complaint 10/27; Right Plantar foot wound 11/21; 2 small areas of skin breakdown to the right foot following cast placement 8/29; right plantar foot wound Electronic Signature(s) Signed: 04/21/2022 12:51:55 PM By: Kalman Shan DO Entered By: Kalman Shan on 04/21/2022 11:00:57 -------------------------------------------------------------------------------- Debridement Details Patient Name: Date of Service: [name] 04/21/2022 9:00 [name] Medical Record Number: [id number] Patient Account Number: [id number] Date of Birth/Sex: Treating RN: 1955/09/14 (67 y.o. [name], [name] Primary Care Provider: Geoffery [name] Other Clinician: Referring Provider: Treating Provider/Extender: [name] in Treatment: 28 Debridement Performed for Assessment: Wound #6 Right,Plantar Foot Performed By: Physician Kalman Shan, DO Debridement Type: Debridement Severity of Tissue Pre Debridement: Fat layer exposed Level of Consciousness (Pre-procedure): Awake and Alert Pre-procedure Verification/Time Out Yes - 09:40 Taken: Start Time: 09:41 Pain Control: Lidocaine 5% topical ointment T Area Debrided (L x W): otal 1 (cm) x 1 (cm) = 1 (cm) Tissue and other material debrided: Viable, Non-Viable, Callus, Slough, Skin: Dermis , Skin: Epidermis, Slough Level: Skin/Epidermis Debridement Description:  Selective/Open Wound Instrument: Curette Bleeding: Minimum Hemostasis Achieved: Pressure End Time: 10:02 Procedural Pain: 0 Post Procedural Pain: 0 Response to Treatment: Procedure was tolerated well Level of Consciousness (Post- Awake and Alert procedure): Post Debridement Measurements of Total Wound Length: (cm) 0.1 Width: (cm) 0.1 Depth: (cm) 0.2 Volume: (cm) 0.002 Character of Wound/Ulcer Post Debridement: Improved Severity of Tissue Post Debridement: Fat layer exposed Post Procedure Diagnosis Same as Pre-procedure Electronic Signature(s) Signed: 04/21/2022 12:51:55 PM By: Kalman Shan DO Signed: 04/21/2022 5:16:11 PM By: Deon Pilling RN, BSN [address] [place] [name] [phone] Pilling RN, BSN [phone].pdf Page 2 of 10 Signed: 04/21/2022 5:16:11 PM Entered By: Deon Pilling on 04/21/2022 10:02:34 -------------------------------------------------------------------------------- HPI Details Patient Name: Date of Service: [name], [name]. 04/21/2022 9:00 [name] Medical Record Number: [id number] Patient Account Number: [id number] Date of Birth/Sex: Treating RN: 12-19-1955 (67 y.o. F) Primary Care Provider: Geoffery [name] Other Clinician: Referring Provider: Treating Provider/Extender: [name] in Treatment: 28 History of Present Illness HPI Description: Admission 12/17/2020 [name] with [name] past medical history of insulin-dependent type 2 diabetes, hypothyroidism and daily1 pack per day cigarette smoker the presents to the clinic for [name] 6-week history of nonhealing wound to the right first MTPJ. She has been following with [name], podiatry for this issue. She has been using silver alginate with dressing changes. She uses [name] postsurgical shoe and offloading pads. She currently denies signs of infection. 10/31; patient presents for follow-up. She tolerated the soft  cast fine although she states that she felt her foot rolling to one side. She denies signs of infection. She would like to do the total contact cast today. 12/23/2020 upon evaluation today patient appears to be doing excellent in regard to her wound on the foot and she is in [name] total contact cast. I do think this is appropriate this is the first cast change which we are obliged to do to ensure nothing is  rubbing everything appears to be doing quite well and very pleased in that regard. 11/7; patient presents for follow-up. She had no issues with the cast. She denies signs of infection. 11/14; patient presents for 1 week follow-up. She has had no issues with the cast. She denies signs of infection. 11/21; patient presents for 1 week follow-up. She did develop 2 small areas of skin breakdown on either side of the right foot from the cast rubbing. She states she did not feel the wounds developing. She currently denies signs of infection. 11/28; patient presents for 1 week follow-up. She has no issues or complaints today. She denies pain or acute signs of infection. 12/12; patient presents for follow-up. She reports improvement to her right lateral foot wound. She has been using silver alginate to the area. She has no issues or complaints today. 12/19; patient presents for follow-up. She reports that her right lateral foot wound has healed. She has no issues or complaints today. 1/3; patient presents for follow-up. She has no issues or complaints today. She reports no open wounds. Readmission 10/01/2021 [name]'s with [name] past medical history of insulin-dependent controlled type 2 diabetes complicated by peripheral neuropathy that presents to the clinic for [name] 40-monthhistory of nonhealing ulcer to the right foot. I have seen her before for [name] wound to the same area that was treated and healed. Her wound today however this is much deeper and it has thick yellow drainage. She  is not sure exactly how it started. She noticed it 1 day. She has been following with Dr. EAmalia Haileyfor this issue. She had [name] wound culture that showed [name] mix of organisms on 09/01/2021. She was recently started on doxycycline. She is using the dConservation officer, nature She has been using Iodosorb with dressing changes. She currently denies systemic signs of infection. 8/21; patient presents for follow-up. She states she started and completed levofloxacin. She is still taking doxycycline. She reports improvement in drainage and odor. She has been doing Dakin's wet-to-dry dressings as well. She is using her front offloading shoe. She denies signs of infection. 8/29; patient presents for follow-up. She is still taking doxycycline. She continues to report improvement in drainage and reports no odor. She denies any purulent drainage. She has been doing silver alginate to the wound bed and using her front offloading shoe. 9/5; patient presents for follow-up. She completed the course of levofloxacin. She has someone that is able to drive her today so we can place the total contact cast. She reports no signs of infection. We have been doing silver alginate to the wound bed. 9/7; patient presents for follow-up. She has tolerated the total contact cast well. She presents for her obligatory cast change. She has no issues or complaints today. 9/11; patient presents for follow-up. She tolerated the total contact cast well. She has no issues or complaints today. We discussed potentially doing [name] skin substitute and patient would like to see if her insurance will cover this. 9/18; patient presents for follow-up. She had no issues with the total contact cast. She been approved for Grafix and patient would like to have this placed today. 9/25; patient presents for follow-up. Grafix #1 was placed in standard fashion at last clinic visit. She has no issues or complaints today. She tolerated the cast well. 10/2; patient presents for  follow-up. Grafix #2 was placed in standard fashion at last clinic visit. Unfortunately she developed [name] wound to the lateral aspect of the  foot over the past week. She states she has been on her feet [name] lot more and walking more. She denies systemic signs of infection. 10/9; patient presents for follow-up. Grafix #3 was placed in standard at last clinic visit. The plantar foot wound has healed with this. She developed [name] new wound to the lateral aspect last week and this has gotten larger. She reports soreness to this area. She denies systemic signs of infection. 10/16; patient presents for follow-up. Her plantar wound continues to remain closed. Her lateral wound has improved with the use of Hydrofera Blue. She has no issues or complaints today. She denies signs of infection. She has almost completed her course of antibiotics prescribed at last clinic visit. 10/23; patient presents for follow-up. She has been using Hydrofera Blue and Medihoney to the wound bed. She has no issues or complaints today. She has been using her surgical shoe. 10/30; patient presents for follow-up. We have been using Hydrofera Blue and Medihoney to the wound bed. Patient has no issues or complaints today. [name] [name] [name] ([id number]) [id number] Page 3 of 10 11/10; patient presents for follow-up. She has been using Hydrofera Blue and Medihoney without issues. She has been wearing her Orthotics. It is unclear if she is offloading this area or not. 11/17; patient presents for follow-up. She has been using Hydrofera Blue and Medihoney to the foot wound without issues. Grafix was available today for placement and patient would like to proceed with this. 12/4; patient presents for follow-up. Grafix was placed at last clinic visit. Her wound is healed. She has no issues or complaints today. She is reported no drainage from the previous wound site. 12/8; patient states that her podiatrist removed [name]  callus on the previous wound site to the lateral right foot and there is [name] wound present. She currently denies signs of infection. 12/15; patient presents for follow-up. We have been using Grafix to the wound bed. This was placed in standard fashion today. 12/21; patient presents for follow-up. We have been using Grafix to the wound bed. She completed her oral antibiotics. She has no issues or complaints today. 12/28; patient presents for follow-up. We have been using Grafix to the wound bed. She has reported no drainage. She is prepared for the total contact cast today. Her husband is present. 1/4; patient presents for follow-up. We use Grafix under the total contact cast at last clinic visit. Unfortunately her cast protector bag broke and the cast got wet in the shower. Her foot is macerated today. She denies signs of infection. 1/12; patient presents for follow-up. At last clinic visit her foot was macerated due to the total contact cast getting wet. This was not reapplied. She has been doing Medihoney and Hydrofera Blue to the wound bed. She is reported no drainage. 1/19; patient presents for follow-up. Her wound remains closed. She has noted no drainage. 04/14/2022 Patient presents with reopening of the right foot plantar wound. She states it started out as [name] blister. She has been using Medihoney to the wound bed. She saw her podiatrist for this issue and was referred to our clinic. She has had [name] total contact cast for this same wound with success in healing. She currently denies signs of infection. 2/27; patient presents for follow-up. Plans for the total contact cast today. Patient has no issues or complaints she has been using Medihoney and Hydrofera Blue. 2/29; patient presents for follow-up. She tolerated the total contact cast well. We have been doing [employer]  and antibiotic ointment. Electronic Signature(s) Signed: 04/21/2022 12:51:55 PM By: Kalman Shan DO Entered By:  Kalman Shan on 04/21/2022 11:01:19 -------------------------------------------------------------------------------- Physical Exam Details Patient Name: Date of Service: [name] 04/21/2022 9:00 [name] Medical Record Number: [id number] Patient Account Number: [id number] Date of Birth/Sex: Treating RN: May 04, 1955 (67 y.o. F) Primary Care Provider: Geoffery [name] Other Clinician: Referring Provider: Treating Provider/Extender: [name] in Treatment: 28 Constitutional respirations regular, non-labored and within target range for patient.. Cardiovascular 2+ dorsalis pedis/posterior tibialis pulses. Psychiatric pleasant and cooperative. Notes T the right plantar foot there is an open wound with granulation tissue, nonviable tissue and callus. No signs of surrounding infection. o Electronic Signature(s) Signed: 04/21/2022 12:51:55 PM By: Kalman Shan DO Entered By: Kalman Shan on 04/21/2022 11:01:47 -------------------------------------------------------------------------------- Physician Orders Details Patient Name: Date of Service: [name] 04/21/2022 9:00 [name] Medical Record Number: [id number] Patient Account Number: [id number] Date of Birth/Sex: Treating RN: June 05, 1955 (67 y.o. [name], [name] Primary Care Provider: Geoffery [name] Other Clinician: Referring Provider: Treating Provider/Extender: Henrine Screws, Candra [name] ([id number]) [phone].pdf Page 4 of 10 Weeks in Treatment: 28 Verbal / Phone Orders: No Diagnosis Coding ICD-10 Coding Code Description L97.512 Non-pressure chronic ulcer of other part of right foot with fat layer exposed E11.621 Type 2 diabetes mellitus with foot ulcer E11.42 Type 2 diabetes mellitus with diabetic polyneuropathy Follow-up Appointments ppointment in 1 week. - Dr. Heber [place] 130pm 04/28/2022 Room 8 Return  [name] ****CANCEL 04/26/2022 APPT**** Anesthetic (In clinic) Topical Lidocaine 5% applied to wound bed Bathing/ Shower/ Hygiene May shower with protection but do not get wound dressing(s) wet. Protect dressing(s) with water repellant cover (for example, large plastic bag) or [name] cast cover and may then take shower. Off-Loading Total Contact Cast to Right Lower Extremity - size 3 pad the foot well to prevent rubbing from cast. Wound Treatment Wound #6 - Foot Wound Laterality: Plantar, Right Cleanser: Wound Cleanser 1 x Per Week/30 Days Discharge Instructions: Cleanse the wound with wound cleanser prior to applying [name] clean dressing using gauze sponges, not tissue or cotton balls. Topical: Gentamicin 1 x Per Week/30 Days Discharge Instructions: As directed by physician Topical: Mupirocin Ointment 1 x Per Week/30 Days Discharge Instructions: Apply Mupirocin (Bactroban) as instructed Prim Dressing: Hydrofera Blue Ready Transfer Foam, 2.5x2.5 (in/in) 1 x Per Week/30 Days ary Discharge Instructions: Apply directly to wound bed as directed Secondary Dressing: Optifoam Non-Adhesive Dressing, 4x4 in 1 x Per Week/30 Days Discharge Instructions: Apply over primary dressing as directed. Secondary Dressing: Woven Gauze Sponge, Non-Sterile 4x4 in 1 x Per Week/30 Days Discharge Instructions: Apply over primary dressing as directed. Secured With: Scientist, forensic, Sterile 4x75 (in/in) 1 x Per Week/30 Days Discharge Instructions: Secure with stretch gauze as directed. Secured With: Paper Tape, 2x10 (in/yd) 1 x Per Week/30 Days Discharge Instructions: Secure dressing with tape as directed. Electronic Signature(s) Signed: 04/21/2022 12:51:55 PM By: Kalman Shan DO Entered By: Kalman Shan on 04/21/2022 11:01:53 -------------------------------------------------------------------------------- Problem List Details Patient Name: Date of Service: [name] 04/21/2022 9:00 [name]  [name] Medical Record Number: [id number] Patient Account Number: [id number] Date of Birth/Sex: Treating RN: Jul 15, 1955 (67 y.o. [name], [name] Primary Care Provider: Geoffery [name] Other Clinician: Referring Provider: Treating Provider/Extender: [name] in Treatment: 28 Active Problems ICD-10 Encounter Code Description Active Date MDM [name] [name] [name] ([id number]) [id number] Page 5 of  10 Code Description Active Date MDM Diagnosis L97.512 Non-pressure chronic ulcer of other part of right foot with fat layer exposed 10/01/2021 No Yes E11.621 Type 2 diabetes mellitus with foot ulcer 10/01/2021 No Yes E11.42 Type 2 diabetes mellitus with diabetic polyneuropathy 10/01/2021 No Yes Inactive Problems Resolved Problems Electronic Signature(s) Signed: 04/21/2022 12:51:55 PM By: Kalman Shan DO Entered By: Kalman Shan on 04/21/2022 11:00:48 -------------------------------------------------------------------------------- Progress Note Details Patient Name: Date of Service: [name] 04/21/2022 9:00 [name] Medical Record Number: [id number] Patient Account Number: [id number] Date of Birth/Sex: Treating RN: Aug 25, 1955 (67 y.o. F) Primary Care Provider: Geoffery [name] Other Clinician: Referring Provider: Treating Provider/Extender: [name] in Treatment: 28 Subjective Chief Complaint Information obtained from Patient 10/27; Right Plantar foot wound 11/21; 2 small areas of skin breakdown to the right foot following cast placement 8/29; right plantar foot wound History of Present Illness (HPI) Admission 12/17/2020 [name] with [name] past medical history of insulin-dependent type 2 diabetes, hypothyroidism and daily1 pack per day cigarette smoker the presents to the clinic for [name] 6-week history of nonhealing wound to the right first MTPJ. She has  been following with [name], podiatry for this issue. She has been using silver alginate with dressing changes. She uses [name] postsurgical shoe and offloading pads. She currently denies signs of infection. 10/31; patient presents for follow-up. She tolerated the soft cast fine although she states that she felt her foot rolling to one side. She denies signs of infection. She would like to do the total contact cast today. 12/23/2020 upon evaluation today patient appears to be doing excellent in regard to her wound on the foot and she is in [name] total contact cast. I do think this is appropriate this is the first cast change which we are obliged to do to ensure nothing is rubbing everything appears to be doing quite well and very pleased in that regard. 11/7; patient presents for follow-up. She had no issues with the cast. She denies signs of infection. 11/14; patient presents for 1 week follow-up. She has had no issues with the cast. She denies signs of infection. 11/21; patient presents for 1 week follow-up. She did develop 2 small areas of skin breakdown on either side of the right foot from the cast rubbing. She states she did not feel the wounds developing. She currently denies signs of infection. 11/28; patient presents for 1 week follow-up. She has no issues or complaints today. She denies pain or acute signs of infection. 12/12; patient presents for follow-up. She reports improvement to her right lateral foot wound. She has been using silver alginate to the area. She has no issues or complaints today. 12/19; patient presents for follow-up. She reports that her right lateral foot wound has healed. She has no issues or complaints today. 1/3; patient presents for follow-up. She has no issues or complaints today. She reports no open wounds. Readmission 10/01/2021 [name]'s with [name] past medical history of insulin-dependent controlled type 2 diabetes complicated by  peripheral neuropathy that presents to the clinic for [name] 75-monthhistory of nonhealing ulcer to the right foot. I have seen her before for [name] wound to the same area that was treated and healed. Her wound today however this is much deeper and it has thick yellow drainage. She is not sure exactly how it started. She noticed it 1 day. She has been following with Dr.  Evans for this issue. She had [name] wound culture that showed [name] mix of organisms on 09/01/2021. She was recently started on doxycycline. She is using the Conservation officer, nature. She has been using Iodosorb with dressing changes. She currently denies systemic signs of infection. 8/21; patient presents for follow-up. She states she started and completed levofloxacin. She is still taking doxycycline. She reports improvement in drainage and odor. She has been doing Dakin's wet-to-dry dressings as well. She is using her front offloading shoe. She denies signs of infection. 8/29; patient presents for follow-up. She is still taking doxycycline. She continues to report improvement in drainage and reports no odor. She denies any [name] [name] [name] ([id number]) [id number] Page 6 of 10 purulent drainage. She has been doing silver alginate to the wound bed and using her front offloading shoe. 9/5; patient presents for follow-up. She completed the course of levofloxacin. She has someone that is able to drive her today so we can place the total contact cast. She reports no signs of infection. We have been doing silver alginate to the wound bed. 9/7; patient presents for follow-up. She has tolerated the total contact cast well. She presents for her obligatory cast change. She has no issues or complaints today. 9/11; patient presents for follow-up. She tolerated the total contact cast well. She has no issues or complaints today. We discussed potentially doing [name] skin substitute and patient would like to see if her insurance will cover  this. 9/18; patient presents for follow-up. She had no issues with the total contact cast. She been approved for Grafix and patient would like to have this placed today. 9/25; patient presents for follow-up. Grafix #1 was placed in standard fashion at last clinic visit. She has no issues or complaints today. She tolerated the cast well. 10/2; patient presents for follow-up. Grafix #2 was placed in standard fashion at last clinic visit. Unfortunately she developed [name] wound to the lateral aspect of the foot over the past week. She states she has been on her feet [name] lot more and walking more. She denies systemic signs of infection. 10/9; patient presents for follow-up. Grafix #3 was placed in standard at last clinic visit. The plantar foot wound has healed with this. She developed [name] new wound to the lateral aspect last week and this has gotten larger. She reports soreness to this area. She denies systemic signs of infection. 10/16; patient presents for follow-up. Her plantar wound continues to remain closed. Her lateral wound has improved with the use of Hydrofera Blue. She has no issues or complaints today. She denies signs of infection. She has almost completed her course of antibiotics prescribed at last clinic visit. 10/23; patient presents for follow-up. She has been using Hydrofera Blue and Medihoney to the wound bed. She has no issues or complaints today. She has been using her surgical shoe. 10/30; patient presents for follow-up. We have been using Hydrofera Blue and Medihoney to the wound bed. Patient has no issues or complaints today. 11/10; patient presents for follow-up. She has been using Hydrofera Blue and Medihoney without issues. She has been wearing her Orthotics. It is unclear if she is offloading this area or not. 11/17; patient presents for follow-up. She has been using Hydrofera Blue and Medihoney to the foot wound without issues. Grafix was available today for placement and patient  would like to proceed with this. 12/4; patient presents for follow-up. Grafix was placed at last clinic visit. Her wound is healed. She has no issues or  complaints today. She is reported no drainage from the previous wound site. 12/8; patient states that her podiatrist removed [name] callus on the previous wound site to the lateral right foot and there is [name] wound present. She currently denies signs of infection. 12/15; patient presents for follow-up. We have been using Grafix to the wound bed. This was placed in standard fashion today. 12/21; patient presents for follow-up. We have been using Grafix to the wound bed. She completed her oral antibiotics. She has no issues or complaints today. 12/28; patient presents for follow-up. We have been using Grafix to the wound bed. She has reported no drainage. She is prepared for the total contact cast today. Her husband is present. 1/4; patient presents for follow-up. We use Grafix under the total contact cast at last clinic visit. Unfortunately her cast protector bag broke and the cast got wet in the shower. Her foot is macerated today. She denies signs of infection. 1/12; patient presents for follow-up. At last clinic visit her foot was macerated due to the total contact cast getting wet. This was not reapplied. She has been doing Medihoney and Hydrofera Blue to the wound bed. She is reported no drainage. 1/19; patient presents for follow-up. Her wound remains closed. She has noted no drainage. 04/14/2022 Patient presents with reopening of the right foot plantar wound. She states it started out as [name] blister. She has been using Medihoney to the wound bed. She saw her podiatrist for this issue and was referred to our clinic. She has had [name] total contact cast for this same wound with success in healing. She currently denies signs of infection. 2/27; patient presents for follow-up. Plans for the total contact cast today. Patient has no issues or complaints she has  been using Medihoney and Hydrofera Blue. 2/29; patient presents for follow-up. She tolerated the total contact cast well. We have been doing Hydrofera Blue and antibiotic ointment. Patient History Information obtained from Patient, Chart. Family History Unknown History. Social History Current every day smoker - 1 pack/[name]/day, Alcohol Use - Never, Drug Use - No History, Caffeine Use - Moderate. Medical History Eyes Denies history of Cataracts, Glaucoma, Optic Neuritis Ear/Nose/Mouth/Throat Denies history of Chronic sinus problems/congestion, Middle ear problems Hematologic/Lymphatic Denies history of Anemia, Hemophilia, Human Immunodeficiency Virus, Lymphedema, Sickle Cell Disease Respiratory Patient has history of Sleep Apnea Denies history of Aspiration, Asthma, Chronic Obstructive Pulmonary Disease (COPD), Pneumothorax, Tuberculosis Cardiovascular Patient has history of Coronary Artery Disease, Hypertension Denies history of Angina, Arrhythmia, Congestive Heart Failure, Deep Vein Thrombosis, Hypotension, Myocardial Infarction, Peripheral Arterial Disease, Peripheral Venous Disease, Phlebitis, Vasculitis [name] [name] [name] ([id number].pdf Page 7 of 10 Gastrointestinal Denies history of Cirrhosis , Colitis, Crohnoos, Hepatitis [name], Hepatitis B, Hepatitis C Endocrine Patient has history of Type II Diabetes Denies history of Type I Diabetes Genitourinary Denies history of End Stage Renal Disease Immunological Denies history of Lupus Erythematosus, Raynaudoos, Scleroderma Integumentary (Skin) Denies history of History of Burn Musculoskeletal Denies history of Gout, Rheumatoid Arthritis, Osteoarthritis, Osteomyelitis Neurologic Patient has history of Neuropathy Denies history of Dementia, Quadriplegia, Paraplegia, Seizure Disorder Hospitalization/Surgery History - 2nd and 3rd right toe amps. - total hysterectomy. - cholecystectomy. - 2 knee  surgery's on left and 1 on right. Medical [name] Surgical History Notes nd Cardiovascular hypercholesterolemia Objective Constitutional respirations regular, non-labored and within target range for patient.. Vitals Time Taken: 9:15 AM, Temperature: 97.6 F, Pulse: 90 bpm, Respiratory Rate: 18 breaths/min, Blood Pressure: 141/80 mmHg, Capillary Blood Glucose: 202 mg/dl. Cardiovascular 2+ dorsalis  pedis/posterior tibialis pulses. Psychiatric pleasant and cooperative. General Notes: T the right plantar foot there is an open wound with granulation tissue, nonviable tissue and callus. No signs of surrounding infection. o Integumentary (Hair, Skin) Wound #6 status is Open. Original cause of wound was Blister. The date acquired was: 04/14/2022. The wound has been in treatment 1 weeks. The wound is located on the [place]. The wound measures 0.1cm length x 0.1cm width x 0.2cm depth; 0.008cm^2 area and 0.002cm^3 volume. There is Fat Layer (Subcutaneous Tissue) exposed. There is no tunneling or undermining noted. There is [name] medium amount of serosanguineous drainage noted. There is large (67- 100%) red granulation within the wound bed. The periwound skin appearance had no abnormalities noted for moisture. The periwound skin appearance had no abnormalities noted for color. The periwound skin appearance exhibited: Callus. Periwound temperature was noted as No Abnormality. Assessment Active Problems ICD-10 Non-pressure chronic ulcer of other part of right foot with fat layer exposed Type 2 diabetes mellitus with foot ulcer Type 2 diabetes mellitus with diabetic polyneuropathy Patient's wound has shown improvement in size and appearance since last clinic visit. I debrided nonviable tissue. I recommended continuing the course with antibiotic ointment and Hydrofera Blue under the total contact cast. Follow-up in 1 week. Procedures Wound #6 Pre-procedure diagnosis of Wound #6 is [name] Diabetic  Wound/Ulcer of the Lower Extremity located on the Right,Plantar Foot .Severity of Tissue Pre Debridement is: Fat layer exposed. There was [name] Selective/Open Wound Skin/Epidermis Debridement with [name] total area of 1 sq cm performed by Kalman Shan, DO. With the following instrument(s): Curette to remove Viable and Non-Viable tissue/material. Material removed includes Callus, Slough, Skin: Dermis, and Skin: Epidermis after achieving pain control using Lidocaine 5% topical ointment. [name] time out was conducted at 09:40, prior to the start of the procedure. [name] Minimum amount of bleeding was controlled with Pressure. The procedure was tolerated well with [name] pain level of 0 throughout and [name] pain level of 0 following the procedure. Post Debridement Measurements: 0.1cm length x 0.1cm width x 0.2cm depth; 0.002cm^3 volume. [name] [name] [name] ([id number]) [id number] Page 8 of 10 Character of Wound/Ulcer Post Debridement is improved. Severity of Tissue Post Debridement is: Fat layer exposed. Post procedure Diagnosis Wound #6: Same as Pre-Procedure Pre-procedure diagnosis of Wound #6 is [name] Diabetic Wound/Ulcer of the Lower Extremity located on the Right,Plantar Foot . There was [name] T Programmer, multimedia otal Procedure by Kalman Shan, DO. Post procedure Diagnosis Wound #6: Same as Pre-Procedure Plan Follow-up Appointments: Return Appointment in 1 week. - Dr. Heber Guys 130pm 04/28/2022 Room 8 ****CANCEL 04/26/2022 APPT**** Anesthetic: (In clinic) Topical Lidocaine 5% applied to wound bed Bathing/ Shower/ Hygiene: May shower with protection but do not get wound dressing(s) wet. Protect dressing(s) with water repellant cover (for example, large plastic bag) or [name] cast cover and may then take shower. Off-Loading: T Contact Cast to Right Lower Extremity - size 3 pad the foot well to prevent rubbing from cast. otal WOUND #6: - Foot Wound Laterality: Plantar, Right Cleanser: Wound Cleanser 1 x Per  Week/30 Days Discharge Instructions: Cleanse the wound with wound cleanser prior to applying [name] clean dressing using gauze sponges, not tissue or cotton balls. Topical: Gentamicin 1 x Per Week/30 Days Discharge Instructions: As directed by physician Topical: Mupirocin Ointment 1 x Per Week/30 Days Discharge Instructions: Apply Mupirocin (Bactroban) as instructed Prim Dressing: Hydrofera Blue Ready Transfer Foam, 2.5x2.5 (in/in) 1 x Per Week/30 Days ary Discharge Instructions: Apply directly to wound bed  as directed Secondary Dressing: Optifoam Non-Adhesive Dressing, 4x4 in 1 x Per Week/30 Days Discharge Instructions: Apply over primary dressing as directed. Secondary Dressing: Woven Gauze Sponge, Non-Sterile 4x4 in 1 x Per Week/30 Days Discharge Instructions: Apply over primary dressing as directed. Secured With: Scientist, forensic, Sterile 4x75 (in/in) 1 x Per Week/30 Days Discharge Instructions: Secure with stretch gauze as directed. Secured With: Paper T ape, 2x10 (in/yd) 1 x Per Week/30 Days Discharge Instructions: Secure dressing with tape as directed. 1. In office sharp debridement 2. Antibiotic ointment with Hydrofera Blue 3. T contact cast placed in standard fashion otal 4. Follow-up in 1 week Electronic Signature(s) Signed: 04/21/2022 12:51:55 PM By: Kalman Shan DO Entered By: Kalman Shan on 04/21/2022 11:02:29 -------------------------------------------------------------------------------- HxROS Details Patient Name: Date of Service: [name] 04/21/2022 9:00 [name] Medical Record Number: [id number] Patient Account Number: [id number] Date of Birth/Sex: Treating RN: 09-20-1955 (67 y.o. F) Primary Care Provider: Geoffery [name] Other Clinician: Referring Provider: Treating Provider/Extender: [name] in Treatment: 28 Information Obtained From Patient Chart Eyes Medical History: Negative for:  Cataracts; Glaucoma; Optic Neuritis Ear/Nose/Mouth/Throat Medical History: Negative for: Chronic sinus problems/congestion; Middle ear problems Hematologic/Lymphatic Medical HistoryCHEYANN, [name] ([id number]) [phone].pdf Page 9 of 10 Negative for: Anemia; Hemophilia; Human Immunodeficiency Virus; Lymphedema; Sickle Cell Disease Respiratory Medical History: Positive for: Sleep Apnea Negative for: Aspiration; Asthma; Chronic Obstructive Pulmonary Disease (COPD); Pneumothorax; Tuberculosis Cardiovascular Medical History: Positive for: Coronary Artery Disease; Hypertension Negative for: Angina; Arrhythmia; Congestive Heart Failure; Deep Vein Thrombosis; Hypotension; Myocardial Infarction; Peripheral Arterial Disease; Peripheral Venous Disease; Phlebitis; Vasculitis Past Medical History Notes: hypercholesterolemia Gastrointestinal Medical History: Negative for: Cirrhosis ; Colitis; Crohns; Hepatitis [name]; Hepatitis B; Hepatitis C Endocrine Medical History: Positive for: Type II Diabetes Negative for: Type I Diabetes Genitourinary Medical History: Negative for: End Stage Renal Disease Immunological Medical History: Negative for: Lupus Erythematosus; Raynauds; Scleroderma Integumentary (Skin) Medical History: Negative for: History of Burn Musculoskeletal Medical History: Negative for: Gout; Rheumatoid Arthritis; Osteoarthritis; Osteomyelitis Neurologic Medical History: Positive for: Neuropathy Negative for: Dementia; Quadriplegia; Paraplegia; Seizure Disorder Immunizations Pneumococcal Vaccine: Received Pneumococcal Vaccination: Yes Received Pneumococcal Vaccination On or After 60th Birthday: Yes Tetanus Vaccine: Last tetanus shot: 12/17/2020 Implantable Devices No devices added Hospitalization / Surgery History Type of Hospitalization/Surgery 2nd and 3rd right toe amps total hysterectomy cholecystectomy 2 knee surgery's on left and 1 on  right Family and Social History Unknown History: Yes; Current every day smoker - 1 pack/[name]/day; Alcohol Use: Never; Drug Use: No History; Caffeine Use: Moderate; Financial Concerns: No; Food, Clothing or Shelter Needs: No; Support System Lacking: No; Transportation Concerns: No Electronic Signature(s) [name] [name] [name] ([id number]) [id number] Page 10 of 10 Signed: 04/21/2022 12:51:55 PM By: Kalman Shan DO Entered By: Kalman Shan on 04/21/2022 11:01:25 -------------------------------------------------------------------------------- Total Contact Cast Details Patient Name: Date of Service: [name] 04/21/2022 9:00 [name] Medical Record Number: [id number] Patient Account Number: [id number] Date of Birth/Sex: Treating RN: 11-14-1955 (67 y.o. [name] Primary Care Provider: Geoffery [name] Other Clinician: Referring Provider: Treating Provider/Extender: [name] in Treatment: 86 T Contact Cast Applied for Wound Assessment: otal Wound #6 Right,Plantar Foot Performed By: Physician Kalman Shan, DO Post Procedure Diagnosis Same as Pre-procedure Electronic Signature(s) Signed: 04/21/2022 12:51:55 PM By: Kalman Shan DO Signed: 04/21/2022 5:16:11 PM By: Deon Pilling RN, BSN Entered By: Deon Pilling on 04/21/2022 10:07:23 -------------------------------------------------------------------------------- SuperBill Details Patient Name: Date of  Service: [name] Earnest Bailey RD, [name]. 04/21/2022 Medical Record Number: [id number] Patient Account Number: [id number] Date of Birth/Sex: Treating RN: 07/28/1955 (67 y.o. [name], [name] Primary Care Provider: Geoffery [name] Other Clinician: Referring Provider: Treating Provider/Extender: [name] in Treatment: 28 Diagnosis Coding ICD-10 Codes Code Description [phone] Non-pressure chronic ulcer of other part of right  foot with fat layer exposed E11.621 Type 2 diabetes mellitus with foot ulcer E11.42 Type 2 diabetes mellitus with diabetic polyneuropathy Facility Procedures : CPT4 Code: [id number] Description: [id number] - DEBRIDE WOUND 1ST 20 SQ CM OR < ICD-10 Diagnosis Description L97.512 Non-pressure chronic ulcer of other part of right foot with fat layer exposed E11.621 Type 2 diabetes mellitus with foot ulcer Modifier: Quantity: 1 Physician Procedures : CPT4 Code Description Modifier [id number] - WC PHYS DEBR WO ANESTH 20 SQ CM ICD-10 Diagnosis Description L97.512 Non-pressure chronic ulcer of other part of right foot with fat layer exposed E11.621 Type 2 diabetes mellitus with foot ulcer Quantity: 1 Electronic Signature(s) Signed: 04/21/2022 12:51:55 PM By: Kalman Shan DO Entered By: Kalman Shan on 04/21/2022 11:02:43

## 2022-04-26 ENCOUNTER — Ambulatory Visit (HOSPITAL_BASED_OUTPATIENT_CLINIC_OR_DEPARTMENT_OTHER): Payer: Medicare Other | Admitting: Internal Medicine

## 2022-04-27 ENCOUNTER — Ambulatory Visit (INDEPENDENT_AMBULATORY_CARE_PROVIDER_SITE_OTHER): Payer: Medicare Other | Admitting: Podiatry

## 2022-04-27 DIAGNOSIS — B351 Tinea unguium: Secondary | ICD-10-CM | POA: Diagnosis not present

## 2022-04-27 DIAGNOSIS — L989 Disorder of the skin and subcutaneous tissue, unspecified: Secondary | ICD-10-CM | POA: Diagnosis not present

## 2022-04-27 DIAGNOSIS — M79676 Pain in unspecified toe(s): Secondary | ICD-10-CM | POA: Diagnosis not present

## 2022-04-27 NOTE — Progress Notes (Signed)
Chief Complaint  Patient presents with   Diabetes    Diabetic foot care, A1c- 8.0 nail and callus trim     SUBJECTIVE Patient presents to office today complaining of elongated, thickened nails that cause pain while ambulating in shoes.  Patient is unable to trim their own nails.  Patient has also developed a symptomatic painful callus to the distal tip of the left third toe.  She would like to have it evaluated today.  Patient currently has a total contact cast applied to the right lower extremity from wound care.  Left intact today  Past Medical History:  Diagnosis Date   Anxiety    Arthritis    knees   Coronary artery disease    Depression    Fatty liver    History of nuclear stress test 12/16/2011   exercise myoview; normal images with 2-15m ST-segment depression - subsequent cath revelaed subtotally occluded small 2nd marginal branch & 75% PDA lesion, normal LV function   Hypertension    Hypothyroidism    Neuropathy    in feet   OSA on CPAP    AHI = 44 (per patient) setting 13 per pt   Personal history of kidney stones    Primary localized osteoarthritis of left knee 04/24/2015   Primary localized osteoarthritis of right knee 07/02/2015   Tobacco abuse    Type 2 diabetes mellitus (HCC)    insulin pump    Allergies  Allergen Reactions   Penicillins Hives    Has patient had a PCN reaction causing immediate rash, facial/tongue/throat swelling, SOB or lightheadedness with hypotension: Unknown Has patient had a PCN reaction causing severe rash involving mucus membranes or skin necrosis: Unknown Has patient had a PCN reaction that required hospitalization: Unknown Has patient had a PCN reaction occurring within the last 10 years: No If all of the above answers are "NO", then may proceed with Cephalosporin use.     OBJECTIVE General Patient is awake, alert, and oriented x 3 and in no acute distress. Derm Skin is dry and supple. Negative open lesions or macerations.  Remaining integument unremarkable. Nails are tender, long, thickened and dystrophic with subungual debris, consistent with onychomycosis, 1-5 left. No signs of infection noted.  Hyperkeratotic preulcerative callus noted to the distal tip of the digit left foot Vasc  DP and PT pedal pulses palpable left. Temperature gradient within normal limits.  Neuro Epicritic and protective threshold sensation grossly intact left Musculoskeletal Exam total contact cast intact right lower extremity  ASSESSMENT 1.  Pain due to onychomycosis of toenails left 2.  Symptomatic preulcerative callus left third toe  PLAN OF CARE 1. Patient evaluated today.  2. Instructed to maintain good pedal hygiene and foot care.  3. Mechanical debridement of nails 1-5 left performed using a nail nipper. Filed with dremel without incident.  4.  Excisional debridement of the hyperkeratotic preulcerative callus lesion was performed today using a tissue nipper without incident or bleeding.  As a courtesy for the patient 5.  Return to clinic 3 months for routine footcare    BEdrick Kins DPM [facility]  Dr. BEdrick Kins DPM    [address] [place] [id number]  Office [phone]  Fax [phone]

## 2022-04-28 ENCOUNTER — Encounter (HOSPITAL_BASED_OUTPATIENT_CLINIC_OR_DEPARTMENT_OTHER): Payer: Medicare Other | Attending: Internal Medicine | Admitting: Internal Medicine

## 2022-04-28 DIAGNOSIS — E11621 Type 2 diabetes mellitus with foot ulcer: Secondary | ICD-10-CM | POA: Diagnosis not present

## 2022-04-28 DIAGNOSIS — L97512 Non-pressure chronic ulcer of other part of right foot with fat layer exposed: Secondary | ICD-10-CM | POA: Diagnosis not present

## 2022-04-28 DIAGNOSIS — Z794 Long term (current) use of insulin: Secondary | ICD-10-CM | POA: Insufficient documentation

## 2022-04-28 DIAGNOSIS — E1142 Type 2 diabetes mellitus with diabetic polyneuropathy: Secondary | ICD-10-CM | POA: Diagnosis not present

## 2022-04-28 DIAGNOSIS — E039 Hypothyroidism, unspecified: Secondary | ICD-10-CM | POA: Diagnosis not present

## 2022-04-28 DIAGNOSIS — F17210 Nicotine dependence, cigarettes, uncomplicated: Secondary | ICD-10-CM | POA: Diagnosis not present

## 2022-04-28 NOTE — Progress Notes (Signed)
NELL, MILBOURNE ([id number]) [id number] Page 1 of 7 Visit Report for 04/21/2022 Arrival Information Details Patient Name: Date of Service: [name], [name] 04/21/2022 9:00 A M Medical Record Number: [id number] Patient Account Number: [id number] Date of Birth/Sex: Treating RN: Sep 29, 1955 (67 y.o. F) Primary Care Maxfield Gildersleeve: [name] Other Clinician: Referring Yuvan Medinger: Treating Joelee Snoke/Extender: [name] in Treatment: 28 Visit Information History Since Last Visit Added or deleted any medications: No Patient Arrived: Ambulatory Any new allergies or adverse reactions: No Arrival Time: 09:14 Had a fall or experienced change in No Accompanied By: self activities of daily living that may affect Transfer Assistance: None risk of falls: Patient Identification Verified: Yes Signs or symptoms of abuse/neglect since last No Secondary Verification Process Completed: Yes visito Patient Requires Transmission-Based Precautions: No Hospitalized since last visit: No Patient Has Alerts: No Implantable device outside of the clinic No excluding cellular tissue based products placed in the center since last visit: Has Footwear/Offloading in Place as Yes Prescribed: Right: Removable Cast Walker/Walking Boot Pain Present Now: No Electronic Signature(s) Signed: 04/26/2022 3:59:08 PM By: Erenest Blank Entered By: Erenest Blank on 04/21/2022 09:14:56 -------------------------------------------------------------------------------- Encounter Discharge Information Details Patient Name: Date of Service: [name], [name] 04/21/2022 9:00 A M Medical Record Number: [id number] Patient Account Number: [id number] Date of Birth/Sex: Treating RN: [birth year] (67 y.o. [name], [name] Primary Care Tasmine Hipwell: [name] Other Clinician: Referring Jazlynne Milliner: Treating Story Vanvranken/Extender: [name] in Treatment: 28 Encounter Discharge Information Items Post Procedure Vitals Discharge Condition: Stable Temperature (F): 97.6 Ambulatory Status: Ambulatory Pulse (bpm): 90 Discharge Destination: Home Respiratory Rate (breaths/min): 18 Transportation: Private Auto Blood Pressure (mmHg): 141/80 Accompanied By: self Schedule Follow-up Appointment: Yes Clinical Summary of Care: Electronic Signature(s) Signed: 04/21/2022 5:16:11 PM By: Deon Pilling RN, BSN Entered By: Deon Pilling on 04/21/2022 10:08:05 -------------------------------------------------------------------------------- Lower Extremity Assessment Details Patient Name: Date of Service: [name], [name] 04/21/2022 9:00 A M Medical Record Number: [id number] Patient Account Number: [id number] Date of Birth/Sex: Treating RN: [birth year] (67 y.o. F) Primary Care Rodney Wigger: [name] Other Clinician: Referring Deshon Hsiao: Treating Demaria Deeney/Extender: [name] in Treatment: [address], Tanyia K ([id number]) [id number] Page 2 of 7 Edema Assessment Assessed: [Left: No] [Right: No] Edema: [Left: N] [Right: o] Calf Left: Right: Point of Measurement: From Medial Instep 42.5 cm Ankle Left: Right: Point of Measurement: From Medial Instep 24 cm Electronic Signature(s) Signed: 04/26/2022 3:59:08 PM By: Erenest Blank Entered By: Erenest Blank on 04/21/2022 09:32:30 -------------------------------------------------------------------------------- Multi Wound Chart Details Patient Name: Date of Service: [name], [name] 04/21/2022 9:00 A M Medical Record Number: [id number] Patient Account Number: [id number] Date of Birth/Sex: Treating RN: 07-09-1955 (67 y.o. F) Primary Care Brianny Soulliere: [name] Other Clinician: Referring Crystale Giannattasio: Treating Jackson Fetters/Extender: [name] in Treatment: 28 Vital  Signs Height(in): Capillary Blood Glucose(mg/dl): 202 Weight(lbs): Pulse(bpm): 22 Body Mass Index(BMI): Blood Pressure(mmHg): 141/80 Temperature(F): 97.6 Respiratory Rate(breaths/min): 18 [6:Photos:] [N/A:N/A] Right, Plantar Foot N/A N/A Wound Location: Blister N/A N/A Wounding Event: Diabetic Wound/Ulcer of the Lower N/A N/A Primary Etiology: Extremity Sleep Apnea, Coronary Artery N/A N/A Comorbid History: Disease, Hypertension, Type II Diabetes, Neuropathy 04/14/2022 N/A N/A Date Acquired: 1 N/A N/A Weeks of Treatment: Open N/A N/A Wound Status: No N/A N/A Wound Recurrence: 0.1x0.1x0.2 N/A N/A Measurements L x W x D (cm) 0.008 N/A N/A A (cm) : rea 0.002 N/A N/A  Volume (cm) : 88.70% N/A N/A % Reduction in A rea: 90.50% N/A N/A % Reduction in Volume: Grade 2 N/A N/A Classification: Medium N/A N/A Exudate A mount: Serosanguineous N/A N/A Exudate Type: red, brown N/A N/A Exudate Color: Large (67-100%) N/A N/A Granulation A mount: Red N/A N/A Granulation Quality: Fat Layer (Subcutaneous Tissue): Yes N/A N/A Exposed Structures: Fascia: No Tendon: No Muscle: No Joint: No Bone: No Idaly, Lyson Kaydynce K ([id number] Page 3 of 7 None N/A N/A Epithelialization: Debridement - Selective/Open Wound N/A N/A Debridement: 09:40 N/A N/A Pre-procedure Verification/Time Out Taken: Lidocaine 5% topical ointment N/A N/A Pain Control: Callus, Slough N/A N/A Tissue Debrided: Skin/Epidermis N/A N/A Level: 1 N/A N/A Debridement A (sq cm): rea Curette N/A N/A Instrument: Minimum N/A N/A Bleeding: Pressure N/A N/A Hemostasis A chieved: 0 N/A N/A Procedural Pain: 0 N/A N/A Post Procedural Pain: Procedure was tolerated well N/A N/A Debridement Treatment Response: 0.1x0.1x0.2 N/A N/A Post Debridement Measurements L x W x D (cm) 0.002 N/A N/A Post Debridement Volume: (cm) Callus: Yes N/A N/A Periwound Skin  Texture: No Abnormalities Noted N/A N/A Periwound Skin Moisture: No Abnormalities Noted N/A N/A Periwound Skin Color: No Abnormality N/A N/A Temperature: Debridement N/A N/A Procedures Performed: T Contact Cast otal Treatment Notes Wound #6 (Foot) Wound Laterality: Plantar, Right Cleanser Wound Cleanser Discharge Instruction: Cleanse the wound with wound cleanser prior to applying a clean dressing using gauze sponges, not tissue or cotton balls. Peri-Wound Care Topical Gentamicin Discharge Instruction: As directed by physician Mupirocin Ointment Discharge Instruction: Apply Mupirocin (Bactroban) as instructed Primary Dressing Hydrofera Blue Ready Transfer Foam, 2.5x2.5 (in/in) Discharge Instruction: Apply directly to wound bed as directed Secondary Dressing Optifoam Non-Adhesive Dressing, 4x4 in Discharge Instruction: Apply over primary dressing as directed. Woven Gauze Sponge, Non-Sterile 4x4 in Discharge Instruction: Apply over primary dressing as directed. Secured With Conforming Stretch Gauze Bandage Roll, Sterile 4x75 (in/in) Discharge Instruction: Secure with stretch gauze as directed. Paper Tape, 2x10 (in/yd) Discharge Instruction: Secure dressing with tape as directed. Compression Wrap Compression Stockings Add-Ons Electronic Signature(s) Signed: 04/21/2022 12:51:55 PM By: Kalman Shan DO Entered By: Kalman Shan on 04/21/2022 11:00:51 -------------------------------------------------------------------------------- Multi-Disciplinary Care Plan Details Patient Name: Date of Service: [name], [name] 04/21/2022 9:00 A M Medical Record Number: [id number] Patient Account Number: [id number] Date of Birth/Sex: Treating RN: September 23, 1955 (67 y.o. [name] Primary Care Satoya Feeley: [name] Other Clinician: Harold Hedge ([id number]) [id number] Page 4 of 7 Referring Alazia Crocket: Treating Hommer Cunliffe/Extender:  [name] in Treatment: 28 Active Inactive Wound/Skin Impairment Nursing Diagnoses: Impaired tissue integrity Knowledge deficit related to ulceration/compromised skin integrity Goals: Patient/caregiver will verbalize understanding of skin care regimen Date Initiated: 10/01/2021 Target Resolution Date: 05/12/2022 Goal Status: Active Ulcer/skin breakdown will have a volume reduction of 30% by week 4 Date Initiated: 10/01/2021 Target Resolution Date: 05/12/2022 Goal Status: Active Interventions: Assess patient/caregiver ability to obtain necessary supplies Assess patient/caregiver ability to perform ulcer/skin care regimen upon admission and as needed Assess ulceration(s) every visit Provide education on ulcer and skin care Notes: Electronic Signature(s) Signed: 04/21/2022 5:16:11 PM By: Deon Pilling RN, BSN Entered By: Deon Pilling on 04/21/2022 10:06:48 -------------------------------------------------------------------------------- Pain Assessment Details Patient Name: Date of Service: [name], [name] 04/21/2022 9:00 A M Medical Record Number: [id number] Patient Account Number: [id number] Date of Birth/Sex: Treating RN: 28-Jul-1955 (67 y.o. F) Primary Care Fortune Torosian: [name] Other Clinician: Referring Edker Punt: Treating Daoud Lobue/Extender: [name] in  Treatment: 28 Active Problems Location of Pain Severity and Description of Pain Patient Has Paino No Site Locations Pain Management and Medication Current Pain Management: Electronic Signature(s) Signed: 04/26/2022 3:59:08 PM By: Denny Levy, Ruble K 3:59:08 PM By: Erenest Blank Signed: 04/26/2022 ([id number] Page 5 of 7 Entered By: Erenest Blank on 04/21/2022 09:17:46 -------------------------------------------------------------------------------- Patient/Caregiver Education Details Patient  Name: Date of Service: [name], [name] 2/29/2024andnbsp9:00 [place] Record Number: [id number] Patient Account Number: [id number] Date of Birth/Gender: Treating RN: [birth year] (67 y.o. [name], [name] Primary Care Physician: [name] Other Clinician: Referring Physician: Treating Physician/Extender: [name] in Treatment: 28 Education Assessment Education Provided To: Patient Education Topics Provided Offloading: Handouts: How Offloading Helps Foot Wounds Heal Methods: Explain/Verbal Responses: Reinforcements needed Electronic Signature(s) Signed: 04/21/2022 5:16:11 PM By: Deon Pilling RN, BSN Entered By: Deon Pilling on 04/21/2022 10:07:03 -------------------------------------------------------------------------------- Wound Assessment Details Patient Name: Date of Service: [name], [name] 04/21/2022 9:00 A M Medical Record Number: [id number] Patient Account Number: [id number] Date of Birth/Sex: Treating RN: Jun 14, 1955 (67 y.o. F) Primary Care Lynsay Fesperman: [name] Other Clinician: Referring Sianna Garofano: Treating Lear Carstens/Extender: [name] in Treatment: 28 Wound Status Wound Number: 6 Primary Diabetic Wound/Ulcer of the Lower Extremity Etiology: Wound Location: Right, Plantar Foot Wound Open Wounding Event: Blister Status: Date Acquired: 04/14/2022 Comorbid Sleep Apnea, Coronary Artery Disease, Hypertension, Type II Weeks Of Treatment: 1 History: Diabetes, Neuropathy Clustered Wound: No Photos Wound Measurements Length: (cm) 0.1 Width: (cm) 0.1 Depth: (cm) 0.2 Area: (cm) 0.0 Volume: (cm) 0.0 Glover, Annell K ([id number]) Wound Description Classification: Grade 2 Exudate Amount: Medium Exudate Type: Serosanguineous Exudate Color: red, brown Foul Odor After Cleansing: Slough/Fibrino % Reduction in Area: 88.7% % Reduction in Volume: 90.5% Epithelialization:  None 08 Tunneling: No 02 Undermining: No [id number] Page 6 of 7 No No Wound Bed Granulation Amount: Large (67-100%) Exposed Structure Granulation Quality: Red Fascia Exposed: No Fat Layer (Subcutaneous Tissue) Exposed: Yes Tendon Exposed: No Muscle Exposed: No Joint Exposed: No Bone Exposed: No Periwound Skin Texture Texture Color No Abnormalities Noted: No No Abnormalities Noted: Yes Callus: Yes Temperature / Pain Temperature: No Abnormality Moisture No Abnormalities Noted: Yes Treatment Notes Wound #6 (Foot) Wound Laterality: Plantar, Right Cleanser Wound Cleanser Discharge Instruction: Cleanse the wound with wound cleanser prior to applying a clean dressing using gauze sponges, not tissue or cotton balls. Peri-Wound Care Topical Gentamicin Discharge Instruction: As directed by physician Mupirocin Ointment Discharge Instruction: Apply Mupirocin (Bactroban) as instructed Primary Dressing Hydrofera Blue Ready Transfer Foam, 2.5x2.5 (in/in) Discharge Instruction: Apply directly to wound bed as directed Secondary Dressing Optifoam Non-Adhesive Dressing, 4x4 in Discharge Instruction: Apply over primary dressing as directed. Woven Gauze Sponge, Non-Sterile 4x4 in Discharge Instruction: Apply over primary dressing as directed. Secured With Conforming Stretch Gauze Bandage Roll, Sterile 4x75 (in/in) Discharge Instruction: Secure with stretch gauze as directed. Paper Tape, 2x10 (in/yd) Discharge Instruction: Secure dressing with tape as directed. Compression Wrap Compression Stockings Add-Ons Electronic Signature(s) Signed: 04/26/2022 3:59:08 PM By: Erenest Blank Entered By: Erenest Blank on 04/21/2022 09:35:30 -------------------------------------------------------------------------------- Vitals Details Patient Name: Date of Service: [name], [name] 04/21/2022 9:00 A M Medical Record Number: [id number] Patient Account Number:  [id number] Date of Birth/Sex: Treating RN: [birth year] (67 y.o. F) Primary Care Jandy Brackens: [name] Other Clinician: Harold Hedge ([id number]) [id number] Page 7 of 7 Referring Rosalind Guido: Treating Elfrieda Espino/Extender: Jaclyn Shaggy, Constance Goltz in  Treatment: 28 Vital Signs Time Taken: 09:15 Temperature (F): 97.6 Pulse (bpm): 90 Respiratory Rate (breaths/min): 18 Blood Pressure (mmHg): 141/80 Capillary Blood Glucose (mg/dl): 202 Reference Range: 80 - 120 mg / dl Electronic Signature(s) Signed: 04/26/2022 3:59:08 PM By: Erenest Blank Entered By: Erenest Blank on 04/21/2022 09:17:40

## 2022-04-30 NOTE — Progress Notes (Signed)
[name], [name] ([id number]) [id number] Page 1 of 10 Visit Report for 04/28/2022 Chief Complaint Document Details Patient Name: Date of Service: [name] [name], [name] 04/28/2022 1:30 PM Medical Record Number: [id number] Patient Account Number: [id number] Date of Birth/Sex: Treating RN: Mar 26, 1955 (67 y.o. F) Primary Care Provider: Geoffery [name] Other Clinician: Referring Provider: Treating Provider/Extender: [name] in Treatment: 29 Information Obtained from: Patient Chief Complaint 10/27; Right Plantar foot wound 11/21; 2 small areas of skin breakdown to the right foot following cast placement 8/29; right plantar foot wound Electronic Signature(s) Signed: 04/28/2022 4:58:12 PM By: [name] Shan DO Entered By: [name] on 04/28/2022 16:32:49 -------------------------------------------------------------------------------- HPI Details Patient Name: Date of Service: [place], [name] 04/28/2022 1:30 PM Medical Record Number: [id number] Patient Account Number: [id number] Date of Birth/Sex: Treating RN: 11-10-1955 (67 y.o. F) Primary Care Provider: Geoffery [name] Other Clinician: Referring Provider: Treating Provider/Extender: [name] in Treatment: 29 History of Present Illness HPI Description: Admission 12/17/2020 [name] with [name] past medical history of insulin-dependent type 2 diabetes, hypothyroidism and daily1 pack per day cigarette smoker the presents to the clinic for [name] 6-week history of nonhealing wound to the right first MTPJ. She has been following with [name], podiatry for this issue. She has been using silver alginate with dressing changes. She uses [name] postsurgical shoe and offloading pads. She currently denies signs of infection. 10/31; patient presents for follow-up. She tolerated the soft cast fine although she  states that she felt her foot rolling to one side. She denies signs of infection. She would like to do the total contact cast today. 12/23/2020 upon evaluation today patient appears to be doing excellent in regard to her wound on the foot and she is in [name] total contact cast. I do think this is appropriate this is the first cast change which we are obliged to do to ensure nothing is rubbing everything appears to be doing quite well and very pleased in that regard. 11/7; patient presents for follow-up. She had no issues with the cast. She denies signs of infection. 11/14; patient presents for 1 week follow-up. She has had no issues with the cast. She denies signs of infection. 11/21; patient presents for 1 week follow-up. She did develop 2 small areas of skin breakdown on either side of the right foot from the cast rubbing. She states she did not feel the wounds developing. She currently denies signs of infection. 11/28; patient presents for 1 week follow-up. She has no issues or complaints today. She denies pain or acute signs of infection. 12/12; patient presents for follow-up. She reports improvement to her right lateral foot wound. She has been using silver alginate to the area. She has no issues or complaints today. 12/19; patient presents for follow-up. She reports that her right lateral foot wound has healed. She has no issues or complaints today. 1/3; patient presents for follow-up. She has no issues or complaints today. She reports no open wounds. Readmission 10/01/2021 [name]'s with [name] past medical history of insulin-dependent controlled type 2 diabetes complicated by peripheral neuropathy that presents to the clinic for [name] 50-monthhistory of nonhealing ulcer to the right foot. I have seen her before for [name] wound to the same area that was treated and healed. Her wound today however this is much deeper and it has thick yellow drainage. She is not  sure exactly how  it started. She noticed it 1 day. She has been following with [name] for this issue. She had [name] wound culture that showed [name] mix of organisms on 09/01/2021. She was recently started on doxycycline. She is using the Conservation officer, nature. She has been using Iodosorb with dressing changes. She currently denies systemic signs of infection. 8/21; patient presents for follow-up. She states she started and completed levofloxacin. She is still taking doxycycline. She reports improvement in drainage and odor. She has been doing Dakin's wet-to-dry dressings as well. She is using her front offloading shoe. She denies signs of infection. 8/29; patient presents for follow-up. She is still taking doxycycline. She continues to report improvement in drainage and reports no odor. She denies any [name] ([id number]) [id number] Page 2 of 10 purulent drainage. She has been doing silver alginate to the wound bed and using her front offloading shoe. 9/5; patient presents for follow-up. She completed the course of levofloxacin. She has someone that is able to drive her today so we can place the total contact cast. She reports no signs of infection. We have been doing silver alginate to the wound bed. 9/7; patient presents for follow-up. She has tolerated the total contact cast well. She presents for her obligatory cast change. She has no issues or complaints today. 9/11; patient presents for follow-up. She tolerated the total contact cast well. She has no issues or complaints today. We discussed potentially doing [name] skin substitute and patient would like to see if her insurance will cover this. 9/18; patient presents for follow-up. She had no issues with the total contact cast. She been approved for Grafix and patient would like to have this placed today. 9/25; patient presents for follow-up. Grafix #1 was placed in standard fashion at last clinic visit. She has no issues or complaints  today. She tolerated the cast well. 10/2; patient presents for follow-up. Grafix #2 was placed in standard fashion at last clinic visit. Unfortunately she developed [name] wound to the lateral aspect of the foot over the past week. She states she has been on her feet [name] lot more and walking more. She denies systemic signs of infection. 10/9; patient presents for follow-up. Grafix #3 was placed in standard at last clinic visit. The plantar foot wound has healed with this. She developed [name] new wound to the lateral aspect last week and this has gotten larger. She reports soreness to this area. She denies systemic signs of infection. 10/16; patient presents for follow-up. Her plantar wound continues to remain closed. Her lateral wound has improved with the use of Hydrofera Blue. She has no issues or complaints today. She denies signs of infection. She has almost completed her course of antibiotics prescribed at last clinic visit. 10/23; patient presents for follow-up. She has been using Hydrofera Blue and Medihoney to the wound bed. She has no issues or complaints today. She has been using her surgical shoe. 10/30; patient presents for follow-up. We have been using Hydrofera Blue and Medihoney to the wound bed. Patient has no issues or complaints today. 11/10; patient presents for follow-up. She has been using Hydrofera Blue and Medihoney without issues. She has been wearing her Orthotics. It is unclear if she is offloading this area or not. 11/17; patient presents for follow-up. She has been using Hydrofera Blue and Medihoney to the foot wound without issues. Grafix was available today for placement and patient would like to proceed with this. 12/4; patient presents for follow-up.  Grafix was placed at last clinic visit. Her wound is healed. She has no issues or complaints today. She is reported no drainage from the previous wound site. 12/8; patient states that her podiatrist removed [name] callus on the previous  wound site to the lateral right foot and there is [name] wound present. She currently denies signs of infection. 12/15; patient presents for follow-up. We have been using Grafix to the wound bed. This was placed in standard fashion today. 12/21; patient presents for follow-up. We have been using Grafix to the wound bed. She completed her oral antibiotics. She has no issues or complaints today. 12/28; patient presents for follow-up. We have been using Grafix to the wound bed. She has reported no drainage. She is prepared for the total contact cast today. Her husband is present. 1/4; patient presents for follow-up. We use Grafix under the total contact cast at last clinic visit. Unfortunately her cast protector bag broke and the cast got wet in the shower. Her foot is macerated today. She denies signs of infection. 1/12; patient presents for follow-up. At last clinic visit her foot was macerated due to the total contact cast getting wet. This was not reapplied. She has been doing Medihoney and Hydrofera Blue to the wound bed. She is reported no drainage. 1/19; patient presents for follow-up. Her wound remains closed. She has noted no drainage. 04/14/2022 Patient presents with reopening of the right foot plantar wound. She states it started out as [name] blister. She has been using Medihoney to the wound bed. She saw her podiatrist for this issue and was referred to our clinic. She has had [name] total contact cast for this same wound with success in healing. She currently denies signs of infection. 2/27; patient presents for follow-up. Plans for the total contact cast today. Patient has no issues or complaints she has been using Medihoney and Hydrofera Blue. 2/29; patient presents for follow-up. She tolerated the total contact cast well. We have been doing Hydrofera Blue and antibiotic ointment. 3/7; patient presents for follow-up. She has had no issues with the total contact cast. We have been doing Hydrofera Blue  and antibiotic ointment. There is been improvement in wound healing. Electronic Signature(s) Signed: 04/28/2022 4:58:12 PM By: [name] Shan DO Entered By: [name] on 04/28/2022 16:33:15 -------------------------------------------------------------------------------- Physical Exam Details Patient Name: Date of Service: [name] [name], [name] 04/28/2022 1:30 PM Medical Record Number: [id number] Patient Account Number: [id number] Date of Birth/Sex: Treating RN: [birth year] (67 y.o. F) Primary Care Provider: Geoffery [name] Other Clinician: Referring Provider: Treating Provider/Extender: [name] in Treatment: [address], [place] ([id number]) [id number] Page 3 of 10 Constitutional respirations regular, non-labored and within target range for patient.. Cardiovascular 2+ dorsalis pedis/posterior tibialis pulses. Psychiatric pleasant and cooperative. Notes T the right plantar foot there is an open wound with granulation tissue. No signs of surrounding infection. o Electronic Signature(s) Signed: 04/28/2022 4:58:12 PM By: [name] Shan DO Entered By: [name] on 04/28/2022 16:35:30 -------------------------------------------------------------------------------- Physician Orders Details Patient Name: Date of Service: [name] [name], [name] 04/28/2022 1:30 PM Medical Record Number: [id number] Patient Account Number: [id number] Date of Birth/Sex: Treating RN: 1955/12/22 (67 y.o. [name] Primary Care Provider: Geoffery [name] Other Clinician: Referring Provider: Treating Provider/Extender: [name] in Treatment: 20 Verbal / Phone Orders: No Diagnosis Coding ICD-10 Coding Code Description L97.512 Non-pressure chronic ulcer of other part of right foot with fat layer exposed E11.621  Type 2 diabetes mellitus with foot ulcer E11.42 Type 2 diabetes mellitus with  diabetic polyneuropathy Follow-up Appointments ppointment in 1 week. - Dr. Heber Preston 0900 05/12/2022 Room 7 Return [name] Anesthetic (In clinic) Topical Lidocaine 5% applied to wound bed Bathing/ Shower/ Hygiene May shower with protection but do not get wound dressing(s) wet. Protect dressing(s) with water repellant cover (for example, large plastic bag) or [name] cast cover and may then take shower. Off-Loading Total Contact Cast to Right Lower Extremity - size 3 pad the foot well to prevent rubbing from cast. Wound Treatment Wound #6 - Foot Wound Laterality: Plantar, Right Cleanser: Wound Cleanser 1 x Per Week/30 Days Discharge Instructions: Cleanse the wound with wound cleanser prior to applying [name] clean dressing using gauze sponges, not tissue or cotton balls. Topical: Gentamicin 1 x Per Week/30 Days Discharge Instructions: As directed by physician Topical: Mupirocin Ointment 1 x Per Week/30 Days Discharge Instructions: Apply Mupirocin (Bactroban) as instructed Prim Dressing: Endoform 2x2 in 1 x Per Week/30 Days ary Discharge Instructions: Moisten with saline Secondary Dressing: Optifoam Non-Adhesive Dressing, 4x4 in 1 x Per Week/30 Days Discharge Instructions: Apply over primary dressing as directed. Secondary Dressing: Woven Gauze Sponge, Non-Sterile 4x4 in 1 x Per Week/30 Days Discharge Instructions: Apply over primary dressing as directed. Secured With: Scientist, forensic, Sterile 4x75 (in/in) 1 x Per Week/30 Days Discharge Instructions: Secure with stretch gauze as directed. Secured With: Paper Tape, 2x10 (in/yd) 1 x Per Week/30 Days LUNETTA, MALISZEWSKI ([id number]) [id number] Page 4 of 10 Discharge Instructions: Secure dressing with tape as directed. Electronic Signature(s) Signed: 04/28/2022 4:58:12 PM By: [name] Shan DO Entered By: [name] on 04/28/2022  16:35:40 -------------------------------------------------------------------------------- Problem List Details Patient Name: Date of Service: [name] [name], [name] 04/28/2022 1:30 PM Medical Record Number: [id number] Patient Account Number: [id number] Date of Birth/Sex: Treating RN: [birth year] (67 y.o. [name], [name] Primary Care Provider: Geoffery [name] Other Clinician: Referring Provider: Treating Provider/Extender: [name] in Treatment: 29 Active Problems ICD-10 Encounter Code Description Active Date MDM Diagnosis L97.512 Non-pressure chronic ulcer of other part of right foot with fat layer exposed 10/01/2021 No Yes E11.621 Type 2 diabetes mellitus with foot ulcer 10/01/2021 No Yes E11.42 Type 2 diabetes mellitus with diabetic polyneuropathy 10/01/2021 No Yes Inactive Problems Resolved Problems Electronic Signature(s) Signed: 04/28/2022 4:58:12 PM By: [name] Shan DO Entered By: [name] on 04/28/2022 16:32:39 -------------------------------------------------------------------------------- Progress Note Details Patient Name: Date of Service: [name], [name] 04/28/2022 1:30 PM Medical Record Number: [id number] Patient Account Number: [id number] Date of Birth/Sex: Treating RN: 01-05-1956 (67 y.o. F) Primary Care Provider: Geoffery [name] Other Clinician: Referring Provider: Treating Provider/Extender: [name] in Treatment: 29 Subjective Chief Complaint Information obtained from Patient 10/27; Right Plantar foot wound 11/21; 2 small areas of skin breakdown to the right foot following cast placement 8/29; right plantar foot wound History of Present Illness (HPI) Admission 12/17/2020 [name] with [name] past medical history of insulin-dependent type 2 diabetes, hypothyroidism and daily1 pack per day cigarette smoker the presents to the clinic for [name] 6-week  history of nonhealing wound to the right first MTPJ. She has been following with [name], podiatry for this issue. She has been using silver alginate with dressing changes. She uses [name] postsurgical shoe and offloading pads. She currently denies signs of infection. 10/31; patient presents for follow-up. She tolerated the soft cast fine although  she states that she felt her foot rolling to one side. She denies signs of infection. She would like to do the total contact cast today. [name], [name] ([id number]) [id number] Page 5 of 10 12/23/2020 upon evaluation today patient appears to be doing excellent in regard to her wound on the foot and she is in [name] total contact cast. I do think this is appropriate this is the first cast change which we are obliged to do to ensure nothing is rubbing everything appears to be doing quite well and very pleased in that regard. 11/7; patient presents for follow-up. She had no issues with the cast. She denies signs of infection. 11/14; patient presents for 1 week follow-up. She has had no issues with the cast. She denies signs of infection. 11/21; patient presents for 1 week follow-up. She did develop 2 small areas of skin breakdown on either side of the right foot from the cast rubbing. She states she did not feel the wounds developing. She currently denies signs of infection. 11/28; patient presents for 1 week follow-up. She has no issues or complaints today. She denies pain or acute signs of infection. 12/12; patient presents for follow-up. She reports improvement to her right lateral foot wound. She has been using silver alginate to the area. She has no issues or complaints today. 12/19; patient presents for follow-up. She reports that her right lateral foot wound has healed. She has no issues or complaints today. 1/3; patient presents for follow-up. She has no issues or complaints today. She reports no open wounds. Readmission  10/01/2021 [name]'s with [name] past medical history of insulin-dependent controlled type 2 diabetes complicated by peripheral neuropathy that presents to the clinic for [name] 77-monthhistory of nonhealing ulcer to the right foot. I have seen her before for [name] wound to the same area that was treated and healed. Her wound today however this is much deeper and it has thick yellow drainage. She is not sure exactly how it started. She noticed it 1 day. She has been following with Dr. EAmalia Haileyfor this issue. She had [name] wound culture that showed [name] mix of organisms on 09/01/2021. She was recently started on doxycycline. She is using the dConservation officer, nature She has been using Iodosorb with dressing changes. She currently denies systemic signs of infection. 8/21; patient presents for follow-up. She states she started and completed levofloxacin. She is still taking doxycycline. She reports improvement in drainage and odor. She has been doing Dakin's wet-to-dry dressings as well. She is using her front offloading shoe. She denies signs of infection. 8/29; patient presents for follow-up. She is still taking doxycycline. She continues to report improvement in drainage and reports no odor. She denies any purulent drainage. She has been doing silver alginate to the wound bed and using her front offloading shoe. 9/5; patient presents for follow-up. She completed the course of levofloxacin. She has someone that is able to drive her today so we can place the total contact cast. She reports no signs of infection. We have been doing silver alginate to the wound bed. 9/7; patient presents for follow-up. She has tolerated the total contact cast well. She presents for her obligatory cast change. She has no issues or complaints today. 9/11; patient presents for follow-up. She tolerated the total contact cast well. She has no issues or complaints today. We discussed potentially doing [name] skin substitute and  patient would like to see if her insurance will cover this.  9/18; patient presents for follow-up. She had no issues with the total contact cast. She been approved for Grafix and patient would like to have this placed today. 9/25; patient presents for follow-up. Grafix #1 was placed in standard fashion at last clinic visit. She has no issues or complaints today. She tolerated the cast well. 10/2; patient presents for follow-up. Grafix #2 was placed in standard fashion at last clinic visit. Unfortunately she developed [name] wound to the lateral aspect of the foot over the past week. She states she has been on her feet [name] lot more and walking more. She denies systemic signs of infection. 10/9; patient presents for follow-up. Grafix #3 was placed in standard at last clinic visit. The plantar foot wound has healed with this. She developed [name] new wound to the lateral aspect last week and this has gotten larger. She reports soreness to this area. She denies systemic signs of infection. 10/16; patient presents for follow-up. Her plantar wound continues to remain closed. Her lateral wound has improved with the use of Hydrofera Blue. She has no issues or complaints today. She denies signs of infection. She has almost completed her course of antibiotics prescribed at last clinic visit. 10/23; patient presents for follow-up. She has been using Hydrofera Blue and Medihoney to the wound bed. She has no issues or complaints today. She has been using her surgical shoe. 10/30; patient presents for follow-up. We have been using Hydrofera Blue and Medihoney to the wound bed. Patient has no issues or complaints today. 11/10; patient presents for follow-up. She has been using Hydrofera Blue and Medihoney without issues. She has been wearing her Orthotics. It is unclear if she is offloading this area or not. 11/17; patient presents for follow-up. She has been using Hydrofera Blue and Medihoney to the foot wound without issues.  Grafix was available today for placement and patient would like to proceed with this. 12/4; patient presents for follow-up. Grafix was placed at last clinic visit. Her wound is healed. She has no issues or complaints today. She is reported no drainage from the previous wound site. 12/8; patient states that her podiatrist removed [name] callus on the previous wound site to the lateral right foot and there is [name] wound present. She currently denies signs of infection. 12/15; patient presents for follow-up. We have been using Grafix to the wound bed. This was placed in standard fashion today. 12/21; patient presents for follow-up. We have been using Grafix to the wound bed. She completed her oral antibiotics. She has no issues or complaints today. 12/28; patient presents for follow-up. We have been using Grafix to the wound bed. She has reported no drainage. She is prepared for the total contact cast today. Her husband is present. 1/4; patient presents for follow-up. We use Grafix under the total contact cast at last clinic visit. Unfortunately her cast protector bag broke and the cast got wet in the shower. Her foot is macerated today. She denies signs of infection. 1/12; patient presents for follow-up. At last clinic visit her foot was macerated due to the total contact cast getting wet. This was not reapplied. She has been doing Medihoney and Hydrofera Blue to the wound bed. She is reported no drainage. 1/19; patient presents for follow-up. Her wound remains closed. She has noted no drainage. 04/14/2022 Patient presents with reopening of the right foot plantar wound. She states it started out as [name] blister. She has been using Medihoney to the wound bed. She saw her podiatrist  for this issue and was referred to our clinic. She has had [name] total contact cast for this same wound with success in healing. She currently denies [name], [name] ([id number]) [id number] Page 6 of  10 signs of infection. 2/27; patient presents for follow-up. Plans for the total contact cast today. Patient has no issues or complaints she has been using Medihoney and Hydrofera Blue. 2/29; patient presents for follow-up. She tolerated the total contact cast well. We have been doing Hydrofera Blue and antibiotic ointment. 3/7; patient presents for follow-up. She has had no issues with the total contact cast. We have been doing Hydrofera Blue and antibiotic ointment. There is been improvement in wound healing. Patient History Information obtained from Patient, Chart. Family History Unknown History. Social History Current every day smoker - 1 pack/[name]/day, Alcohol Use - Never, Drug Use - No History, Caffeine Use - Moderate. Medical History Eyes Denies history of Cataracts, Glaucoma, Optic Neuritis Ear/Nose/Mouth/Throat Denies history of Chronic sinus problems/congestion, Middle ear problems Hematologic/Lymphatic Denies history of Anemia, Hemophilia, Human Immunodeficiency Virus, Lymphedema, Sickle Cell Disease Respiratory Patient has history of Sleep Apnea Denies history of Aspiration, Asthma, Chronic Obstructive Pulmonary Disease (COPD), Pneumothorax, Tuberculosis Cardiovascular Patient has history of Coronary Artery Disease, Hypertension Denies history of Angina, Arrhythmia, Congestive Heart Failure, Deep Vein Thrombosis, Hypotension, Myocardial Infarction, Peripheral Arterial Disease, Peripheral Venous Disease, Phlebitis, Vasculitis Gastrointestinal Denies history of Cirrhosis , Colitis, Crohnoos, Hepatitis [name], Hepatitis B, Hepatitis C Endocrine Patient has history of Type II Diabetes Denies history of Type I Diabetes Genitourinary Denies history of End Stage Renal Disease Immunological Denies history of Lupus Erythematosus, Raynaudoos, Scleroderma Integumentary (Skin) Denies history of History of Burn Musculoskeletal Denies history of Gout, Rheumatoid Arthritis,  Osteoarthritis, Osteomyelitis Neurologic Patient has history of Neuropathy Denies history of Dementia, Quadriplegia, Paraplegia, Seizure Disorder Hospitalization/Surgery History - 2nd and 3rd right toe amps. - total hysterectomy. - cholecystectomy. - 2 knee surgery's on left and 1 on right. Medical [name] Surgical History Notes nd Cardiovascular hypercholesterolemia Objective Constitutional respirations regular, non-labored and within target range for patient.. Vitals Time Taken: 1:55 PM, Temperature: 98 F, Pulse: 74 bpm, Respiratory Rate: 20 breaths/min, Blood Pressure: 129/78 mmHg, Capillary Blood Glucose: 178 mg/dl. Cardiovascular 2+ dorsalis pedis/posterior tibialis pulses. Psychiatric pleasant and cooperative. General Notes: T the right plantar foot there is an open wound with granulation tissue. No signs of surrounding infection. o Integumentary (Hair, Skin) Wound #6 status is Open. Original cause of wound was Blister. The date acquired was: 04/14/2022. The wound has been in treatment 2 weeks. The wound is located on the [place]. The wound measures 0.2cm length x 0.2cm width x 0.2cm depth; 0.031cm^2 area and 0.006cm^3 volume. There is Fat Layer (Subcutaneous Tissue) exposed. There is no tunneling or undermining noted. There is [name] medium amount of serosanguineous drainage noted. The wound margin is distinct with the outline attached to the wound base. There is large (67-100%) red granulation within the wound bed. The periwound skin appearance had no abnormalities noted for moisture. The periwound skin appearance had no abnormalities noted for color. The periwound skin appearance exhibited: Callus. [name], [name] ([id number]) [id number] Page 7 of 10 Periwound temperature was noted as No Abnormality. Assessment Active Problems ICD-10 Non-pressure chronic ulcer of other part of right foot with fat layer exposed Type 2 diabetes mellitus with  foot ulcer Type 2 diabetes mellitus with diabetic polyneuropathy Patient's wound has shown improvement in size and appearance and size clinic visit. I recommend at this time using endoform and  antibiotic ointment under the total contact cast. Follow-up in 1 week. Procedures Wound #6 Pre-procedure diagnosis of Wound #6 is [name] Diabetic Wound/Ulcer of the Lower Extremity located on the Right,Plantar Foot . There was [name] T Programmer, multimedia otal Procedure by [name] Shan, DO. Post procedure Diagnosis Wound #6: Same as Pre-Procedure Plan Follow-up Appointments: Return Appointment in 1 week. - Dr. Heber [place] 0900 05/12/2022 Room 7 Anesthetic: (In clinic) Topical Lidocaine 5% applied to wound bed Bathing/ Shower/ Hygiene: May shower with protection but do not get wound dressing(s) wet. Protect dressing(s) with water repellant cover (for example, large plastic bag) or [name] cast cover and may then take shower. Off-Loading: T Contact Cast to Right Lower Extremity - size 3 pad the foot well to prevent rubbing from cast. otal WOUND #6: - Foot Wound Laterality: Plantar, Right Cleanser: Wound Cleanser 1 x Per Week/30 Days Discharge Instructions: Cleanse the wound with wound cleanser prior to applying [name] clean dressing using gauze sponges, not tissue or cotton balls. Topical: Gentamicin 1 x Per Week/30 Days Discharge Instructions: As directed by physician Topical: Mupirocin Ointment 1 x Per Week/30 Days Discharge Instructions: Apply Mupirocin (Bactroban) as instructed Prim Dressing: Endoform 2x2 in 1 x Per Week/30 Days ary Discharge Instructions: Moisten with saline Secondary Dressing: Optifoam Non-Adhesive Dressing, 4x4 in 1 x Per Week/30 Days Discharge Instructions: Apply over primary dressing as directed. Secondary Dressing: Woven Gauze Sponge, Non-Sterile 4x4 in 1 x Per Week/30 Days Discharge Instructions: Apply over primary dressing as directed. Secured With: Scientist, forensic,  Sterile 4x75 (in/in) 1 x Per Week/30 Days Discharge Instructions: Secure with stretch gauze as directed. Secured With: Paper T ape, 2x10 (in/yd) 1 x Per Week/30 Days Discharge Instructions: Secure dressing with tape as directed. 1. Endoform with antibiotic ointment 2. T contact cast placed in standard fashion otal 3. Follow-up in 1 week Electronic Signature(s) Signed: 04/28/2022 4:58:12 PM By: [name] Shan DO Entered By: [name] on 04/28/2022 16:37:35 -------------------------------------------------------------------------------- HxROS Details Patient Name: Date of Service: [name] [name], [name] 04/28/2022 1:30 PM Medical Record Number: [id number] Patient Account Number: [id number] Date of Birth/Sex: Treating RN: 10-01-1955 (67 y.o. F) Primary Care Provider: Geoffery [name] Other Clinician: Harold Hedge ([id number]) [id number] Page 8 of 10 Referring Provider: Treating Provider/Extender: [name] in Treatment: 29 Information Obtained From Patient Chart Eyes Medical History: Negative for: Cataracts; Glaucoma; Optic Neuritis Ear/Nose/Mouth/Throat Medical History: Negative for: Chronic sinus problems/congestion; Middle ear problems Hematologic/Lymphatic Medical History: Negative for: Anemia; Hemophilia; Human Immunodeficiency Virus; Lymphedema; Sickle Cell Disease Respiratory Medical History: Positive for: Sleep Apnea Negative for: Aspiration; Asthma; Chronic Obstructive Pulmonary Disease (COPD); Pneumothorax; Tuberculosis Cardiovascular Medical History: Positive for: Coronary Artery Disease; Hypertension Negative for: Angina; Arrhythmia; Congestive Heart Failure; Deep Vein Thrombosis; Hypotension; Myocardial Infarction; Peripheral Arterial Disease; Peripheral Venous Disease; Phlebitis; Vasculitis Past Medical History Notes: hypercholesterolemia Gastrointestinal Medical History: Negative for:  Cirrhosis ; Colitis; Crohns; Hepatitis [name]; Hepatitis B; Hepatitis C Endocrine Medical History: Positive for: Type II Diabetes Negative for: Type I Diabetes Genitourinary Medical History: Negative for: End Stage Renal Disease Immunological Medical History: Negative for: Lupus Erythematosus; Raynauds; Scleroderma Integumentary (Skin) Medical History: Negative for: History of Burn Musculoskeletal Medical History: Negative for: Gout; Rheumatoid Arthritis; Osteoarthritis; Osteomyelitis Neurologic Medical History: Positive for: Neuropathy Negative for: Dementia; Quadriplegia; Paraplegia; Seizure Disorder Immunizations Pneumococcal Vaccine: Received Pneumococcal Vaccination: Yes Received Pneumococcal Vaccination On or After [address][name], [name] ([id number]) [id number] Page 9 of 10 Tetanus Vaccine: Last tetanus  shot: 12/17/2020 Implantable Devices No devices added Hospitalization / Surgery History Type of Hospitalization/Surgery 2nd and 3rd right toe amps total hysterectomy cholecystectomy 2 knee surgery's on left and 1 on right Family and Social History Unknown History: Yes; Current every day smoker - 1 pack/[name]/day; Alcohol Use: Never; Drug Use: No History; Caffeine Use: Moderate; Financial Concerns: No; Food, Clothing or Shelter Needs: No; Support System Lacking: No; Transportation Concerns: No Electronic Signature(s) Signed: 04/28/2022 4:58:12 PM By: [name] Shan DO Entered By: [name] on 04/28/2022 16:33:22 -------------------------------------------------------------------------------- Total Contact Cast Details Patient Name: Date of Service: [name] [name], [name] 04/28/2022 1:30 PM Medical Record Number: [id number] Patient Account Number: [id number] Date of Birth/Sex: Treating RN: [birth year] (67 y.o. [name], [name] Primary Care Provider: Geoffery [name] Other Clinician: Referring Provider: Treating  Provider/Extender: [name] in Treatment: 29 T Contact Cast Applied for Wound Assessment: otal Wound #6 Right,Plantar Foot Performed By: Physician [name] Shan, DO Post Procedure Diagnosis Same as Pre-procedure Electronic Signature(s) Signed: 04/28/2022 4:49:59 PM By: Deon Pilling RN, BSN Signed: 04/28/2022 4:58:12 PM By: [name] Shan DO Entered By: Deon Pilling on 04/28/2022 15:05:04 -------------------------------------------------------------------------------- SuperBill Details Patient Name: Date of Service: [name] [name], [name] 04/28/2022 Medical Record Number: [id number] Patient Account Number: [id number] Date of Birth/Sex: Treating RN: 10-Mar-1955 (67 y.o. [name], [name] Primary Care Provider: Geoffery [name] Other Clinician: Referring Provider: Treating Provider/Extender: [name] in Treatment: 29 Diagnosis Coding ICD-10 Codes Code Description [phone] Non-pressure chronic ulcer of other part of right foot with fat layer exposed E11.621 Type 2 diabetes mellitus with foot ulcer E11.42 Type 2 diabetes mellitus with diabetic polyneuropathy Facility Procedures : [name], [name] Code: [id number] [place] [phone] Description: [phone] - APPLY TOTAL CONTACT LEG CAST ICD-10 Diagnosis Description L97.512 Non-pressure chronic ulcer of other part of right foot with fat layer exposed E11.621 Type 2 diabetes mellitus with foot ulcer [id number]) [phone] Modifier: [email or web address] Quantity: 1 62.pdf Page 10 of 10 Physician Procedures : CPT4 Code Description Modifier [id number] - WC PHYS APPLY TOTAL CONTACT CAST ICD-10 Diagnosis Description L97.512 Non-pressure chronic ulcer of other part of right foot with fat layer exposed E11.621 Type 2 diabetes mellitus with foot ulcer Quantity: 1 Electronic Signature(s) Signed: 04/28/2022 4:58:12 PM By: [name] Shan DO Entered By: [name] on 04/28/2022  16:37:50

## 2022-04-30 NOTE — Progress Notes (Signed)
[name], [name] ([id number]) [id number] Page 1 of 5 Visit Report for 04/28/2022 Arrival Information Details Patient Name: Date of Service: [place], [name]. 04/28/2022 1:30 PM Medical Record Number: [id number] Patient Account Number: [id number] Date of Birth/Sex: Treating RN: [birth year] (67 y.o. [name], [name] Primary Care Sharion Grieves: [name] Other Clinician: Referring Dwight Burdo: Treating Clydell Alberts/Extender: [name] in Treatment: 95 Visit Information History Since Last Visit Added or deleted any medications: No Patient Arrived: Ambulatory Any new allergies or adverse reactions: No Arrival Time: 13:55 Had [name] fall or experienced change in No Accompanied By: self activities of daily living that may affect Transfer Assistance: None risk of falls: Patient Identification Verified: Yes Signs or symptoms of abuse/neglect since last visito No Secondary Verification Process Completed: Yes Hospitalized since last visit: No Patient Requires Transmission-Based Precautions: No Implantable device outside of the clinic excluding No Patient Has Alerts: No cellular tissue based products placed in the center since last visit: Has Dressing in Place as Prescribed: Yes Has Footwear/Offloading in Place as Prescribed: Yes Right: T Contact Cast otal Pain Present Now: No Electronic Signature(s) Signed: 04/28/2022 4:49:59 PM By: Deon Pilling RN, BSN Entered By: Deon Pilling on 04/28/2022 13:59:37 -------------------------------------------------------------------------------- Lower Extremity Assessment Details Patient Name: Date of Service: [name], [name]. 04/28/2022 1:30 PM Medical Record Number: [id number] Patient Account Number: [id number] Date of Birth/Sex: Treating RN: 06-Sep-1955 (67 y.o. [name], [name] Primary Care Alyria Krack: [name] Other Clinician: Referring Tennyson Kallen: Treating Lavon Horn/Extender:  [name] in Treatment: 29 Edema Assessment Assessed: [name]: No] [Right: Yes] Edema: [Left: N] [Right: o] Calf Left: Right: Point of Measurement: From Medial Instep 43 cm Ankle Left: Right: Point of Measurement: From Medial Instep 24 cm Vascular Assessment Pulses: Dorsalis Pedis Palpable: [Right:Yes] Electronic Signature(s) Signed: 04/28/2022 4:49:59 PM By: Deon Pilling RN, BSN Entered By: Deon Pilling on 04/28/2022 14:00:45 [place], Jamarie [name] ([id number].pdf Page 2 of 5 -------------------------------------------------------------------------------- Multi Wound Chart Details Patient Name: Date of Service: [name], [name]. 04/28/2022 1:30 PM Medical Record Number: [id number] Patient Account Number: [id number] Date of Birth/Sex: Treating RN: 01-06-1956 (67 y.o. F) Primary Care Owais Pruett: [name] Other Clinician: Referring Caro Brundidge: Treating Bodi Palmeri/Extender: [name] in Treatment: 29 Vital Signs Height(in): Capillary Blood Glucose(mg/dl): 178 Weight(lbs): Pulse(bpm): 93 Body Mass Index(BMI): Blood Pressure(mmHg): 129/78 Temperature(F): 98 Respiratory Rate(breaths/min): 20 [6:Photos: No Photos Right, Plantar Foot Wound Location: Blister Wounding Event: Diabetic Wound/Ulcer of the Lower Primary Etiology: Extremity Sleep Apnea, Coronary Artery Comorbid History: Disease, Hypertension, Type II Diabetes, Neuropathy 04/14/2022 Date  Acquired: 2 Weeks of Treatment: Open Wound Status: No Wound Recurrence: 0.2x0.2x0.2 Measurements L x W x D (cm) 0.031 [name] (cm) : rea 0.006 Volume (cm) : 56.30% % Reduction in [name] rea: 71.40% % Reduction in Volume: Grade 2 Classification: Medium Exudate [name]  mount: Serosanguineous Exudate Type: red, brown Exudate Color: Distinct, outline attached Wound Margin: Large (67-100%) Granulation [name] mount: Red Granulation Quality: Fat Layer  (Subcutaneous Tissue): Yes N/[name] Exposed Structures: Fascia: No Tendon: No  Muscle: No Joint: No Bone: No Large (67-100%) Epithelialization: Callus: Yes Periwound Skin Texture: No Abnormalities Noted Periwound Skin Moisture: No Abnormalities Noted Periwound Skin Color: No Abnormality Temperature: T Contact Cast otal Procedures  Performed:] [N/[name]:N/[name] N/[name] N/[name] N/[name] N/[name] N/[name] N/[name] N/[name] N/[name] N/[name] N/[name] N/[name] N/[name] N/[name] N/[name] N/[name] N/[name] N/[name] N/[name] N/[name] N/[name] N/[name] N/[name] N/[name] N/[name] N/[name] N/[name]] Treatment Notes Electronic Signature(s) Signed:  04/28/2022 4:58:12 PM By: Kalman Shan DO Entered By: Kalman Shan on 04/28/2022 16:32:44 -------------------------------------------------------------------------------- Multi-Disciplinary Care Plan Details Patient Name: Date of Service: [name], [name]. 04/28/2022 1:30 PM Medical Record Number: [id number] Patient Account Number: [id number] Date of Birth/Sex: Treating RN: 1956-02-02 (67 y.o. [name], [name] Primary Care Chaunice Obie: [name] Other Clinician: Referring Labrittany Wechter: Treating Carolena Fairbank/Extender: [name] in Treatment: 207 [name], [name] ([id number]) [id number] Page 3 of 5 Wound/Skin Impairment Nursing Diagnoses: Impaired tissue integrity Knowledge deficit related to ulceration/compromised skin integrity Goals: Patient/caregiver will verbalize understanding of skin care regimen Date Initiated: 10/01/2021 Target Resolution Date: 05/12/2022 Goal Status: Active Ulcer/skin breakdown will have [name] volume reduction of 30% by week 4 Date Initiated: 10/01/2021 Target Resolution Date: 05/12/2022 Goal Status: Active Interventions: Assess patient/caregiver ability to obtain necessary supplies Assess patient/caregiver ability to perform ulcer/skin care regimen upon admission and as needed Assess ulceration(s) every visit Provide education on ulcer and skin care Notes: Electronic  Signature(s) Signed: 04/28/2022 4:49:59 PM By: Deon Pilling RN, BSN Entered By: Deon Pilling on 04/28/2022 14:08:00 -------------------------------------------------------------------------------- Pain Assessment Details Patient Name: Date of Service: [name], [name]. 04/28/2022 1:30 PM Medical Record Number: [id number] Patient Account Number: [id number] Date of Birth/Sex: Treating RN: 02-22-1955 (67 y.o. [name], [name] Primary Care Fatou Dunnigan: [name] Other Clinician: Referring Derrian Rodak: Treating Lavontay Kirk/Extender: [name] in Treatment: 29 Active Problems Location of Pain Severity and Description of Pain Patient Has Paino No Site Locations Pain Management and Medication Current Pain Management: Electronic Signature(s) Signed: 04/28/2022 4:49:59 PM By: Deon Pilling RN, BSN Entered By: Deon Pilling on 04/28/2022 14:00:22 Captain, Shondell [name] ([id number] Page 4 of 5 -------------------------------------------------------------------------------- Patient/Caregiver Education Details Patient Name: Date of Service: [name] 3/7/2024andnbsp1:30 PM Medical Record Number: [id number] Patient Account Number: [id number] Date of Birth/Gender: Treating RN: Jul 12, 1955 (67 y.o. [name], [name] Primary Care Physician: [name] Other Clinician: Referring Physician: Treating Physician/Extender: [name] in Treatment: 29 Education Assessment Education Provided To: Patient Education Topics Provided Wound/Skin Impairment: Handouts: Caring for Your Ulcer Methods: Explain/Verbal Responses: Reinforcements needed Electronic Signature(s) Signed: 04/28/2022 4:49:59 PM By: Deon Pilling RN, BSN Entered By: Deon Pilling on 04/28/2022 14:08:11 -------------------------------------------------------------------------------- Wound Assessment Details Patient  Name: Date of Service: [name], [name]. 04/28/2022 1:30 PM Medical Record Number: [id number] Patient Account Number: [id number] Date of Birth/Sex: Treating RN: 05-28-1955 (67 y.o. [name], [name] Primary Care Ethanjames Fontenot: [name] Other Clinician: Referring Shawnay Bramel: Treating Annah Jasko/Extender: [name] in Treatment: 29 Wound Status Wound Number: 6 Primary Diabetic Wound/Ulcer of the Lower Extremity Etiology: Wound Location: Right, Plantar Foot Wound Open Wounding Event: Blister Status: Date Acquired: 04/14/2022 Comorbid Sleep Apnea, Coronary Artery Disease, Hypertension, Type II Weeks Of Treatment: 2 History: Diabetes, Neuropathy Clustered Wound: No Wound Measurements Length: (cm) 0.2 Width: (cm) 0.2 Depth: (cm) 0.2 Area: (cm) 0.031 Volume: (cm) 0.006 % Reduction in Area: 56.3% % Reduction in Volume: 71.4% Epithelialization: Large (67-100%) Tunneling: No Undermining: No Wound Description Classification: Grade 2 Wound Margin: Distinct, outline attached Exudate Amount: Medium Exudate Type: Serosanguineous Exudate Color: red, brown Foul Odor After Cleansing: No Slough/Fibrino No Wound Bed Granulation Amount: Large (67-100%) Exposed Structure Granulation Quality: Red Fascia Exposed: No Fat Layer (Subcutaneous Tissue) Exposed: Yes Tendon Exposed: No Muscle Exposed: No Joint Exposed: No Bone Exposed: No Periwound Skin Texture Texture Color No Abnormalities  Noted: No No Abnormalities Noted: Yes CallusJENNETTA, [name] ([id number]) [id number] Page 5 of 5 Callus: Yes Temperature / Pain Temperature: No Abnormality Moisture No Abnormalities Noted: Yes Electronic Signature(s) Signed: 04/28/2022 4:49:59 PM By: Deon Pilling RN, BSN Entered By: Deon Pilling on 04/28/2022 14:06:02 -------------------------------------------------------------------------------- Vitals Details Patient Name:  Date of Service: [place], [name]. 04/28/2022 1:30 PM Medical Record Number: [id number] Patient Account Number: [id number] Date of Birth/Sex: Treating RN: March 22, 1955 (67 y.o. [name], [name] Primary Care Stewart Pimenta: [name] Other Clinician: Referring Louella Medaglia: Treating Anthony Roland/Extender: [name] in Treatment: 29 Vital Signs Time Taken: 13:55 Temperature (F): 98 Pulse (bpm): 74 Respiratory Rate (breaths/min): 20 Blood Pressure (mmHg): 129/78 Capillary Blood Glucose (mg/dl): 178 Reference Range: 80 - 120 mg / dl Electronic Signature(s) Signed: 04/28/2022 4:49:59 PM By: Deon Pilling RN, BSN Entered By: Deon Pilling on 04/28/2022 14:00:55

## 2022-05-04 ENCOUNTER — Ambulatory Visit (INDEPENDENT_AMBULATORY_CARE_PROVIDER_SITE_OTHER): Payer: Medicare Other | Admitting: Licensed Clinical Social Worker

## 2022-05-04 DIAGNOSIS — F331 Major depressive disorder, recurrent, moderate: Secondary | ICD-10-CM | POA: Diagnosis not present

## 2022-05-04 NOTE — Progress Notes (Signed)
THERAPIST PROGRESS NOTE  Session Time: 9-10a  [place] in office visit for patient and LCSW clinician  Participation Level: Active  Behavioral Response: NAAlertDepressed  Type of Therapy: Individual Therapy  Treatment Goals addressed: Develop healthy thinking patterns and beliefs about self, others, and the world that lead to the alleviation and help prevent the relapse of depression per self report 3 out of 5 sessions documented    ProgressTowards Goals: Progressing  Interventions: Motivational Interviewing, Strength-based, Supportive, and Reframing  Summary: [name] is a 67 y.o. female who presents with improving symptoms associated with depression diagnosis. Pt is working hard to maintain current levels of functioning.  Patient reports that she feels her overall mood is stable, and that she is managing situational stressors well.  Patient reports reports that she frequently procrastinates things that she needs to do, and has low motivation and low initiative.  Allowed pt to explore and express thoughts and feelings associated with recent life situations and external stressors.  Patient reports that 1 primary stressor is the inability to get her medication, Mounjaro, at a reasonable price.  Patient states that her primary care physician is working hard to get insurance company to cover the medication.  Patient reports that in addition to taking this medication she is trying to be more active, and is making healthier nutritional choices.  Encouraged patient to continue engaging in health promoting activities.  Allow patient to explore her thoughts and feelings associated with the recent anniversary of her late sister's death.  Allowed patient to explore relationship, and reflecting her own grief process.  Discussed positive behavioral strategies-patient reports that she enjoys working in the yard, spending time with her pets, spending time with  friends, and cooking.  Patient states that she feels a sense of pride when she does all of these things because patient views them as strengths.  Explored patient's relationship with husband-patient states that she feels bad at times that they seem to be more caregivers for each other and less of an intimate couple.  Discussed evolution of relationships over time.  Encouraged patient to spend more time with husband.  Reviewed coping skills for managing depression, managing stress, and managing anxiety.  Reviewed tools for increasing motivation and initiative, and encouraged patient to engage more in the activities that she enjoys.  Continued recommendations are as follows: self care behaviors, positive social engagements, focusing on overall work/home/life balance, and focusing on positive physical and emotional wellness.   Suicidal/Homicidal: No  Therapist Response: Pt is continuing to apply interventions learned in session into daily life situations. Pt is currently on track to meet goals utilizing interventions mentioned above. Personal growth and progress noted. Treatment to continue as indicated.   Patient feels that she is trying harder overall to improve self-care and life management.  Developments continue in the areas of family and relational functioning.  Plan: Return again in 4 weeks.  Diagnosis:  Encounter Diagnosis  Name Primary?   MDD (major depressive disorder), recurrent episode, moderate ([place]) Yes   Collaboration of Care: Other pt to continue follow ups with PCP Dr. F[facility]  Patient/Guardian was advised Release of Information must be obtained prior to any record release in order to collaborate their care with an outside provider. Patient/Guardian was advised if they have not already done so to contact the registration department to sign all necessary forms in order for [place] to release information regarding their care.   Consent: Patient/Guardian  gives verbal consent for  treatment and assignment of benefits for services provided during this visit. Patient/Guardian expressed understanding and agreed to proceed.   [place], LCSW 05/04/2022

## 2022-05-05 ENCOUNTER — Encounter (HOSPITAL_BASED_OUTPATIENT_CLINIC_OR_DEPARTMENT_OTHER): Payer: Medicare Other | Admitting: Internal Medicine

## 2022-05-05 DIAGNOSIS — Z794 Long term (current) use of insulin: Secondary | ICD-10-CM | POA: Diagnosis not present

## 2022-05-05 DIAGNOSIS — F17210 Nicotine dependence, cigarettes, uncomplicated: Secondary | ICD-10-CM | POA: Diagnosis not present

## 2022-05-05 DIAGNOSIS — E1142 Type 2 diabetes mellitus with diabetic polyneuropathy: Secondary | ICD-10-CM

## 2022-05-05 DIAGNOSIS — E11621 Type 2 diabetes mellitus with foot ulcer: Secondary | ICD-10-CM | POA: Diagnosis not present

## 2022-05-05 DIAGNOSIS — E039 Hypothyroidism, unspecified: Secondary | ICD-10-CM | POA: Diagnosis not present

## 2022-05-05 DIAGNOSIS — L97512 Non-pressure chronic ulcer of other part of right foot with fat layer exposed: Secondary | ICD-10-CM

## 2022-05-06 NOTE — Progress Notes (Signed)
HEYDI, DENNEHY ([id number]) [id number] Page 1 of 9 Visit Report for 05/05/2022 Arrival Information Details Patient Name: Date of Service: [name], [name] 05/05/2022 9:00 [place] Record Number: [id number] Patient Account Number: [id number] Date of Birth/Sex: Treating RN: [birth year] (67 y.o. Harlow Ohms Primary Care Elzabeth Mcquerry: [name] Other Clinician: Referring Genevieve Ritzel: Treating Ramadan Couey/Extender: [name] in Treatment: 18 Visit Information History Since Last Visit Added or deleted any medications: No Patient Arrived: Ambulatory Any new allergies or adverse reactions: No Arrival Time: 08:50 Had a fall or experienced change in No Accompanied By: self activities of daily living that may affect Transfer Assistance: None risk of falls: Patient Identification Verified: Yes Signs or symptoms of abuse/neglect since last visito No Secondary Verification Process Completed: Yes Hospitalized since last visit: No Patient Requires Transmission-Based Precautions: No Implantable device outside of the clinic excluding No Patient Has Alerts: No cellular tissue based products placed in the center since last visit: Has Dressing in Place as Prescribed: Yes Has Footwear/Offloading in Place as Prescribed: Yes Right: T Contact Cast otal Pain Present Now: No Electronic Signature(s) Signed: 05/05/2022 4:01:22 PM By: Adline Peals Entered By: Adline Peals on 05/05/2022 08:55:27 -------------------------------------------------------------------------------- Encounter Discharge Information Details Patient Name: Date of Service: [name], [name] 05/05/2022 9:00 A M Medical Record Number: [id number] Patient Account Number: [id number] Date of Birth/Sex: Treating RN: 05-19-1955 (67 y.o. Harlow Ohms Primary Care Mattthew Ziomek: [name] Other Clinician: Referring Besnik Febus: Treating  Dru Laurel/Extender: [name] in Treatment: 30 Encounter Discharge Information Items Post Procedure Vitals Discharge Condition: Stable Temperature (F): 97.8 Ambulatory Status: Ambulatory Pulse (bpm): 78 Discharge Destination: Home Respiratory Rate (breaths/min): 18 Transportation: Private Auto Blood Pressure (mmHg): 126/66 Accompanied By: self Schedule Follow-up Appointment: Yes Clinical Summary of Care: Patient Declined Electronic Signature(s) Signed: 05/05/2022 4:01:22 PM By: Adline Peals Entered By: Adline Peals on 05/05/2022 09:34:16 -------------------------------------------------------------------------------- Lower Extremity Assessment Details Patient Name: Date of Service: [name], [name] 05/05/2022 9:00 A M Medical Record Number: [id number] Patient Account Number: [id number] Date of Birth/Sex: Treating RN: Dec 04, 1955 (67 y.o. Harlow Ohms Primary Care Eleyna Brugh: [name] Other Clinician: Referring Amiliah Campisi: Treating Iyah Laguna/Extender: Glenna Fellows Weeks in Treatment: 30 Edema Assessment Left: [Left: Right] Patrice Paradise: :] Assessed: [Left: No] [Right: No] Edema: [Left: N] [Right: o] Calf Left: Right: Point of Measurement: From Medial Instep 44 cm Ankle Left: Right: Point of Measurement: From Medial Instep 23.8 cm Vascular Assessment Pulses: Dorsalis Pedis Palpable: [Right:Yes] Electronic Signature(s) Signed: 05/05/2022 4:01:22 PM By: Adline Peals Entered By: Adline Peals on 05/05/2022 09:03:09 -------------------------------------------------------------------------------- Multi Wound Chart Details Patient Name: Date of Service: [name], [name] 05/05/2022 9:00 A M Medical Record Number: [id number] Patient Account Number: [id number] Date of Birth/Sex: Treating RN: 25-Jul-1955 (67 y.o. F) Primary Care Lugene Beougher: [name] Other Clinician: Referring  Patience Nuzzo: Treating Jonalyn Sedlak/Extender: [name] in Treatment: 30 Vital Signs Height(in): Capillary Blood Glucose(mg/dl): 158 Weight(lbs): Pulse(bpm): 19 Body Mass Index(BMI): Blood Pressure(mmHg): 126/66 Temperature(F): 97.8 Respiratory Rate(breaths/min): 18 [6:Photos:] [N/A:N/A] Right, Plantar Foot Right, Lateral Foot N/A Wound Location: Blister Gradually Appeared N/A Wounding Event: Diabetic Wound/Ulcer of the Lower Diabetic Wound/Ulcer of the Lower N/A Primary Etiology: Extremity Extremity Sleep Apnea, Coronary Artery Sleep Apnea, Coronary Artery N/A Comorbid History: Disease, Hypertension, Type II Disease, Hypertension, Type II Diabetes, Neuropathy Diabetes, Neuropathy 04/14/2022 05/05/2022 N/A Date Acquired: 3 0 N/A Weeks of  Treatment: Open Open N/A Wound Status: No No N/A Wound Recurrence: 0.2x0.2x0.2 0.4x0.4x0.3 N/A Measurements L x W x D (cm) 0.031 0.126 N/A A (cm) : rea 0.006 0.038 N/A Volume (cm) : 56.30% N/A N/A % Reduction in A rea: 71.40% N/A N/A % Reduction in Volume: 12 Starting Position 1 (o'clock): 12 Ending Position 1 (o'clock): 0.5 Maximum Distance 1 (cm): No Yes N/A Undermining: Grade 2 Grade 2 N/A Classification: Medium Medium N/A Exudate A mount: Serosanguineous Serosanguineous N/A Exudate TypeUTA, [place] ([id number]) [id number] Page 3 of 9 red, brown red, brown N/A Exudate Color: Distinct, outline attached Distinct, outline attached N/A Wound Margin: Large (67-100%) Large (67-100%) N/A Granulation Amount: Red Red N/A Granulation Quality: N/A None Present (0%) N/A Necrotic Amount: Fat Layer (Subcutaneous Tissue): Yes Fat Layer (Subcutaneous Tissue): Yes N/A Exposed Structures: Fascia: No Fascia: No Tendon: No Tendon: No Muscle: No Muscle: No Joint: No Joint: No Bone: No Bone: No Large (67-100%) Small (1-33%) N/A Epithelialization: N/A  Debridement - Excisional N/A Debridement: Pre-procedure Verification/Time Out N/A 09:16 N/A Taken: N/A Subcutaneous, Slough N/A Tissue Debrided: N/A Skin/Subcutaneous Tissue N/A Level: N/A 0.16 N/A Debridement A (sq cm): rea N/A Curette N/A Instrument: N/A Minimum N/A Bleeding: N/A Pressure N/A Hemostasis A chieved: N/A Procedure was tolerated well N/A Debridement Treatment Response: N/A 0.4x0.4x0.3 N/A Post Debridement Measurements L x W x D (cm) N/A 0.038 N/A Post Debridement Volume: (cm) Callus: Yes Callus: Yes N/A Periwound Skin Texture: No Abnormalities Noted No Abnormalities Noted N/A Periwound Skin Moisture: No Abnormalities Noted No Abnormalities Noted N/A Periwound Skin Color: No Abnormality No Abnormality N/A Temperature: N/A Debridement N/A Procedures Performed: Treatment Notes Wound #6 (Foot) Wound Laterality: Plantar, Right Cleanser Wound Cleanser Discharge Instruction: Cleanse the wound with wound cleanser prior to applying a clean dressing using gauze sponges, not tissue or cotton balls. Peri-Wound Care Topical Primary Dressing MediHoney Gel, tube 1.5 (oz) Discharge Instruction: Apply to wound bed as instructed Secondary Dressing Optifoam Non-Adhesive Dressing, 4x4 in Discharge Instruction: Apply over primary dressing as directed. Woven Gauze Sponge, Non-Sterile 4x4 in Discharge Instruction: Apply over primary dressing as directed. Secured With Conforming Stretch Gauze Bandage Roll, Sterile 4x75 (in/in) Discharge Instruction: Secure with stretch gauze as directed. Paper Tape, 2x10 (in/yd) Discharge Instruction: Secure dressing with tape as directed. Compression Wrap Compression Stockings Add-Ons Wound #7 (Foot) Wound Laterality: Right, Lateral Cleanser Wound Cleanser Discharge Instruction: Cleanse the wound with wound cleanser prior to applying a clean dressing using gauze sponges, not tissue or cotton balls. Peri-Wound  Care Topical Primary Dressing Hydrofera Blue Ready Transfer Foam, 2.5x2.5 (in/in) Discharge Instruction: Apply directly to wound bed as directed [place] Gel, tube 1.5 (oz) Nelani, Mcilroy Piccola K ([id number]) [id number] Page 4 of 9 Discharge Instruction: Apply to wound bed as instructed Secondary Dressing Optifoam Non-Adhesive Dressing, 4x4 in Discharge Instruction: Apply over primary dressing as directed. Woven Gauze Sponge, Non-Sterile 4x4 in Discharge Instruction: Apply over primary dressing as directed. Secured With Conforming Stretch Gauze Bandage Roll, Sterile 4x75 (in/in) Discharge Instruction: Secure with stretch gauze as directed. Paper Tape, 2x10 (in/yd) Discharge Instruction: Secure dressing with tape as directed. Compression Wrap Compression Stockings Add-Ons Electronic Signature(s) Signed: 05/05/2022 3:34:03 PM By: Kalman Shan DO Entered By: Kalman Shan on 05/05/2022 09:48:45 -------------------------------------------------------------------------------- Multi-Disciplinary Care Plan Details Patient Name: Date of Service: [name], [name] 05/05/2022 9:00 A M Medical Record Number: [id number] Patient Account Number: [id number] Date of Birth/Sex: Treating RN: [birth year] (67 y.o. Harlow Ohms Primary Care Ara Grandmaison: [name]  Other Clinician: Referring Carmell Elgin: Treating Coriann Brouhard/Extender: Jaclyn Shaggy, Constance Goltz in Treatment: 30 Active Inactive Wound/Skin Impairment Nursing Diagnoses: Impaired tissue integrity Knowledge deficit related to ulceration/compromised skin integrity Goals: Patient/caregiver will verbalize understanding of skin care regimen Date Initiated: 10/01/2021 Target Resolution Date: 05/12/2022 Goal Status: Active Ulcer/skin breakdown will have a volume reduction of 30% by week 4 Date Initiated: 10/01/2021 Target Resolution Date: 05/12/2022 Goal Status:  Active Interventions: Assess patient/caregiver ability to obtain necessary supplies Assess patient/caregiver ability to perform ulcer/skin care regimen upon admission and as needed Assess ulceration(s) every visit Provide education on ulcer and skin care Notes: Electronic Signature(s) Signed: 05/05/2022 4:01:22 PM By: Adline Peals Entered By: Adline Peals on 05/05/2022 09:19:55 -------------------------------------------------------------------------------- Pain Assessment Details Patient Name: Date of Service: [name], [name] 05/05/2022 9:00 A Rutha Bouchard, Clarrissa K ([id number]) [id number] Page 5 of 9 Medical Record Number: [id number] Patient Account Number: [id number] Date of Birth/Sex: Treating RN: 1955/05/09 (67 y.o. Harlow Ohms Primary Care Nivin Braniff: [name] Other Clinician: Referring Lenton Gendreau: Treating Vanecia Limpert/Extender: [name] in Treatment: 30 Active Problems Location of Pain Severity and Description of Pain Patient Has Paino No Site Locations Rate the pain. Current Pain Level: 0 Pain Management and Medication Current Pain Management: Electronic Signature(s) Signed: 05/05/2022 4:01:22 PM By: Adline Peals Entered By: Adline Peals on 05/05/2022 08:56:16 -------------------------------------------------------------------------------- Patient/Caregiver Education Details Patient Name: Date of Service: [name], [name] 3/14/2024andnbsp9:00 [place] Record Number: [id number] Patient Account Number: [id number] Date of Birth/Gender: Treating RN: [birth year] (67 y.o. Harlow Ohms Primary Care Physician: [name] Other Clinician: Referring Physician: Treating Physician/Extender: [name] in Treatment: 30 Education Assessment Education Provided To: Patient Education Topics Provided Wound/Skin  Impairment: Methods: Explain/Verbal Responses: Reinforcements needed, State content correctly Electronic Signature(s) Signed: 05/05/2022 4:01:22 PM By: Adline Peals Entered By: Adline Peals on 05/05/2022 09:20:05 -------------------------------------------------------------------------------- Wound Assessment Details Patient Name: Date of Service: [name], [name] 05/05/2022 9:00 A M Medical Record Number: [id number] Patient Account Number: [id number] Date of Birth/Sex: Treating RN: [birth year] (67 y.o. [address], [place], Quanasia Raliegh Ip ([id number]) [phone].pdf Page 6 of 9 Primary Care Colletta Spillers: [name] Other Clinician: Referring Asahel Risden: Treating Jaszmine Navejas/Extender: [name] in Treatment: 30 Wound Status Wound Number: 6 Primary Diabetic Wound/Ulcer of the Lower Extremity Etiology: Wound Location: Right, Plantar Foot Wound Open Wounding Event: Blister Status: Date Acquired: 04/14/2022 Comorbid Sleep Apnea, Coronary Artery Disease, Hypertension, Type II Weeks Of Treatment: 3 History: Diabetes, Neuropathy Clustered Wound: No Photos Wound Measurements Length: (cm) 0.2 Width: (cm) 0.2 Depth: (cm) 0.2 Area: (cm) 0.031 Volume: (cm) 0.006 % Reduction in Area: 56.3% % Reduction in Volume: 71.4% Epithelialization: Large (67-100%) Tunneling: No Undermining: No Wound Description Classification: Grade 2 Wound Margin: Distinct, outline attached Exudate Amount: Medium Exudate Type: Serosanguineous Exudate Color: red, brown Foul Odor After Cleansing: No Slough/Fibrino No Wound Bed Granulation Amount: Large (67-100%) Exposed Structure Granulation Quality: Red Fascia Exposed: No Fat Layer (Subcutaneous Tissue) Exposed: Yes Tendon Exposed: No Muscle Exposed: No Joint Exposed: No Bone Exposed: No Periwound Skin Texture Texture Color No Abnormalities Noted: No No Abnormalities  Noted: Yes Callus: Yes Temperature / Pain Temperature: No Abnormality Moisture No Abnormalities Noted: Yes Treatment Notes Wound #6 (Foot) Wound Laterality: Plantar, Right Cleanser Wound Cleanser Discharge Instruction: Cleanse the wound with wound cleanser prior to applying a clean dressing using gauze sponges, not tissue or cotton balls. Peri-Wound  Care Topical Primary Dressing MediHoney Gel, tube 1.5 (oz) Discharge Instruction: Apply to wound bed as instructed Secondary Dressing KATIEMARIE, HAILU ([id number]) [phone].pdf Page 7 of 9 Optifoam Non-Adhesive Dressing, 4x4 in Discharge Instruction: Apply over primary dressing as directed. Woven Gauze Sponge, Non-Sterile 4x4 in Discharge Instruction: Apply over primary dressing as directed. Secured With Conforming Stretch Gauze Bandage Roll, Sterile 4x75 (in/in) Discharge Instruction: Secure with stretch gauze as directed. Paper Tape, 2x10 (in/yd) Discharge Instruction: Secure dressing with tape as directed. Compression Wrap Compression Stockings Add-Ons Electronic Signature(s) Signed: 05/05/2022 4:01:22 PM By: Adline Peals Entered By: Adline Peals on 05/05/2022 09:11:32 -------------------------------------------------------------------------------- Wound Assessment Details Patient Name: Date of Service: [name], [name] 05/05/2022 9:00 A M Medical Record Number: [id number] Patient Account Number: [id number] Date of Birth/Sex: Treating RN: 22-May-1955 (67 y.o. Harlow Ohms Primary Care [place] Athanas: [name] Other Clinician: Referring Lil Lepage: Treating Markia Kyer/Extender: [name] in Treatment: 30 Wound Status Wound Number: 7 Primary Diabetic Wound/Ulcer of the Lower Extremity Etiology: Wound Location: Right, Lateral Foot Wound Open Wounding Event: Gradually Appeared Status: Date Acquired: 05/05/2022 Comorbid Sleep Apnea, Coronary  Artery Disease, Hypertension, Type II Weeks Of Treatment: 0 History: Diabetes, Neuropathy Clustered Wound: No Photos Wound Measurements Length: (cm) 0.4 Width: (cm) 0.4 Depth: (cm) 0.3 Area: (cm) 0.126 Volume: (cm) 0.038 % Reduction in Area: % Reduction in Volume: Epithelialization: Small (1-33%) Tunneling: No Undermining: Yes Starting Position (o'clock): 12 Ending Position (o'clock): 12 Maximum Distance: (cm) 0.5 Wound Description Classification: Grade 2 Wound Margin: Distinct, outline attached Exudate Amount: Medium Exudate Type: Serosanguineous Exudate Color: red, brown Foul Odor After Cleansing: No Wound Bed Patty, Shaum Cesar K ([id number]) [id number] Page 8 of 9 Granulation Amount: Large (67-100%) Exposed Structure Granulation Quality: Red Fascia Exposed: No Necrotic Amount: None Present (0%) Fat Layer (Subcutaneous Tissue) Exposed: Yes Tendon Exposed: No Muscle Exposed: No Joint Exposed: No Bone Exposed: No Periwound Skin Texture Texture Color No Abnormalities Noted: No No Abnormalities Noted: Yes Callus: Yes Temperature / Pain Temperature: No Abnormality Moisture No Abnormalities Noted: Yes Treatment Notes Wound #7 (Foot) Wound Laterality: Right, Lateral Cleanser Wound Cleanser Discharge Instruction: Cleanse the wound with wound cleanser prior to applying a clean dressing using gauze sponges, not tissue or cotton balls. Peri-Wound Care Topical Primary Dressing Hydrofera Blue Ready Transfer Foam, 2.5x2.5 (in/in) Discharge Instruction: Apply directly to wound bed as directed MediHoney Gel, tube 1.5 (oz) Discharge Instruction: Apply to wound bed as instructed Secondary Dressing Optifoam Non-Adhesive Dressing, 4x4 in Discharge Instruction: Apply over primary dressing as directed. Woven Gauze Sponge, Non-Sterile 4x4 in Discharge Instruction: Apply over primary dressing as directed. Secured With Conforming Stretch Gauze  Bandage Roll, Sterile 4x75 (in/in) Discharge Instruction: Secure with stretch gauze as directed. Paper Tape, 2x10 (in/yd) Discharge Instruction: Secure dressing with tape as directed. Compression Wrap Compression Stockings Add-Ons Electronic Signature(s) Signed: 05/05/2022 4:01:22 PM By: Adline Peals Entered By: Adline Peals on 05/05/2022 09:18:59 -------------------------------------------------------------------------------- Vitals Details Patient Name: Date of Service: [name], [name] 05/05/2022 9:00 A M Medical Record Number: [id number] Patient Account Number: [id number] Date of Birth/Sex: Treating RN: 26-Feb-1955 (67 y.o. Harlow Ohms Primary Care Dennys Traughber: [name] Other Clinician: Referring Demetrio Leighty: Treating Fronia Depass/Extender: [name] in Treatment: 30 Vital Signs Time Taken: 08:55 Temperature (F): 97.8 Pulse (bpm): 78 Respiratory Rate (breaths/min): 18 Blood Pressure (mmHg): 126/66 Capillary Blood Glucose (mg/dl): 158 Reish, Zainab K ([id number]) [id number] Page 9 of 9 Reference Range: 80 - 120  mg / dl Electronic Signature(s) Signed: 05/05/2022 4:01:22 PM By: Adline Peals Entered By: Adline Peals on 05/05/2022 08:56:11

## 2022-05-06 NOTE — Progress Notes (Signed)
MAHALA, HIBBITTS ([id number]) [id number] Page 1 of 11 Visit Report for 05/05/2022 Chief Complaint Document Details Patient Name: Date of Service: [name], [name] 05/05/2022 9:00 [place] Record Number: [id number] Patient Account Number: [id number] Date of Birth/Sex: Treating RN: 08-31-1955 (67 y.o. F) Primary Care Provider: [name] Other Clinician: Referring Provider: Treating Provider/Extender: [name] in Treatment: 30 Information Obtained from: Patient Chief Complaint 10/27; Right Plantar [name] wound 11/21; 2 small areas of skin breakdown to the right [name] following cast placement 8/29; right plantar [name] wound Electronic Signature(s) Signed: 05/05/2022 3:34:03 PM By: Kalman Shan DO Entered By: Kalman Shan on 05/05/2022 09:48:51 -------------------------------------------------------------------------------- Debridement Details Patient Name: Date of Service: [name], [name] 05/05/2022 9:00 [name] Medical Record Number: [id number] Patient Account Number: [id number] Date of Birth/Sex: Treating RN: 1956-01-17 (67 y.o. [name] Performed By: Physician Kalman Shan, DO Debridement Type: Debridement Severity of Tissue Pre Debridement: Fat layer exposed Level of Consciousness (Pre-procedure): Awake and Alert Pre-procedure Verification/Time Out Yes - 09:16 Taken: Start Time: 09:16 T Area Debrided (L x W): otal 0.4 (cm) x 0.4 (cm) = 0.16 (cm) Tissue and other material debrided: Non-Viable, Slough, Subcutaneous, Slough Level: Skin/Subcutaneous Tissue Debridement Description: Excisional Instrument: Curette Bleeding: Minimum Hemostasis  Achieved: Pressure Response to Treatment: Procedure was tolerated well Level of Consciousness (Post- Awake and Alert procedure): Post Debridement Measurements of Total Wound Length: (cm) 0.4 Width: (cm) 0.4 Depth: (cm) 0.3 Volume: (cm) 0.038 Character of Wound/Ulcer Post Debridement: Improved Severity of Tissue Post Debridement: Fat layer exposed Post Procedure Diagnosis Same as Pre-procedure Notes scribed for [name] by [name] Peals, RN Electronic Signature(s) Signed: 05/05/2022 3:34:03 PM By: Kalman Shan DO Signed: 05/05/2022 4:01:22 PM By: [name], [name] ([id number]) [id number] Page 2 of 11 Entered By: [name] on 05/05/2022 09:19:14 -------------------------------------------------------------------------------- HPI Details Patient Name: Date of Service: [name], [name] 05/05/2022 9:00 [place] Record Number: [id number] Patient Account Number: [id number] Date of Birth/Sex: Treating RN: April 05, 1955 (67 y.o. F) Primary Care Provider: [name] Other Clinician: Referring Provider: Treating Provider/Extender: [name] in Treatment: 30 History of Present Illness HPI Description: Admission 12/17/2020 [name] is [name] 67 year old female with [name] past medical history of insulin-dependent type 2 diabetes, hypothyroidism and daily1 pack per day cigarette smoker the presents to the clinic for [name] 6-week history of nonhealing wound to the right first MTPJ. She has been following with [name], podiatry for this issue. She has been using silver alginate with dressing changes. She uses [name] postsurgical shoe and offloading pads. She currently denies signs of infection. 10/31; patient presents for follow-up. She tolerated the soft cast fine although she states that she felt her [name] rolling to one side. She denies signs of infection. She would like to do the  total contact cast today. 12/23/2020 upon evaluation today patient appears to be doing excellent in regard to her wound on the [name] and she is in [name] total contact cast. I do think this is appropriate this is the first cast change which we are obliged to do to ensure nothing is rubbing everything appears to be doing quite well and very pleased in that regard. 11/7; patient presents for follow-up. She had no issues  with the cast. She denies signs of infection. 11/14; patient presents for 1 week follow-up. She has had no issues with the cast. She denies signs of infection. 11/21; patient presents for 1 week follow-up. She did develop 2 small areas of skin breakdown on either side of the right [name] from the cast rubbing. She states she did not feel the wounds developing. She currently denies signs of infection. 11/28; patient presents for 1 week follow-up. She has no issues or complaints today. She denies pain or acute signs of infection. 12/12; patient presents for follow-up. She reports improvement to her right lateral [name] wound. She has been using silver alginate to the area. She has no issues or complaints today. 12/19; patient presents for follow-up. She reports that her right lateral [name] wound has healed. She has no issues or complaints today. 1/3; patient presents for follow-up. She has no issues or complaints today. She reports no open wounds. Readmission 10/01/2021 [name] is [name] 67 year old female's with [name] past medical history of insulin-dependent controlled type 2 diabetes complicated by peripheral neuropathy that presents to the clinic for [name] 78-month history of nonhealing ulcer to the right [name]. I have seen her before for [name] wound to the same area that was treated and healed. Her wound today however this is much deeper and it has thick yellow drainage. She is not sure exactly how it started. She noticed it 1 day. She has been following with [name] for this issue. She had [name] wound  culture that showed [name] mix of organisms on 09/01/2021. She was recently started on doxycycline. She is using the Conservation officer, nature. She has been using Iodosorb with dressing changes. She currently denies systemic signs of infection. 8/21; patient presents for follow-up. She states she started and completed levofloxacin. She is still taking doxycycline. She reports improvement in drainage and odor. She has been doing Dakin's wet-to-dry dressings as well. She is using her front offloading shoe. She denies signs of infection. 8/29; patient presents for follow-up. She is still taking doxycycline. She continues to report improvement in drainage and reports no odor. She denies any purulent drainage. She has been doing silver alginate to the wound bed and using her front offloading shoe. 9/5; patient presents for follow-up. She completed the course of levofloxacin. She has someone that is able to drive her today so we can place the total contact cast. She reports no signs of infection. We have been doing silver alginate to the wound bed. 9/7; patient presents for follow-up. She has tolerated the total contact cast well. She presents for her obligatory cast change. She has no issues or complaints today. 9/11; patient presents for follow-up. She tolerated the total contact cast well. She has no issues or complaints today. We discussed potentially doing [name] skin substitute and patient would like to see if her insurance will cover this. 9/18; patient presents for follow-up. She had no issues with the total contact cast. She been approved for Grafix and patient would like to have this placed today. 9/25; patient presents for follow-up. Grafix #1 was placed in standard fashion at last clinic visit. She has no issues or complaints today. She tolerated the cast well. 10/2; patient presents for follow-up. Grafix #2 was placed in standard fashion at last clinic visit. Unfortunately she developed [name] wound to the lateral aspect  of the [name] over the past week. She states she has been on her feet [name] lot more and walking more. She denies systemic signs  of infection. 10/9; patient presents for follow-up. Grafix #3 was placed in standard at last clinic visit. The plantar [name] wound has healed with this. She developed [name] new wound to the lateral aspect last week and this has gotten larger. She reports soreness to this area. She denies systemic signs of infection. 10/16; patient presents for follow-up. Her plantar wound continues to remain closed. Her lateral wound has improved with the use of Hydrofera Blue. She has no issues or complaints today. She denies signs of infection. She has almost completed her course of antibiotics prescribed at last clinic visit. 10/23; patient presents for follow-up. She has been using Hydrofera Blue and Medihoney to the wound bed. She has no issues or complaints today. She has been using her surgical shoe. 10/30; patient presents for follow-up. We have been using Hydrofera Blue and Medihoney to the wound bed. Patient has no issues or complaints today. TAHANI, LATULIPPE ([id number]) [id number] Page 3 of 11 11/10; patient presents for follow-up. She has been using Hydrofera Blue and Medihoney without issues. She has been wearing her Orthotics. It is unclear if she is offloading this area or not. 11/17; patient presents for follow-up. She has been using Hydrofera Blue and Medihoney to the [name] wound without issues. Grafix was available today for placement and patient would like to proceed with this. 12/4; patient presents for follow-up. Grafix was placed at last clinic visit. Her wound is healed. She has no issues or complaints today. She is reported no drainage from the previous wound site. 12/8; patient states that her podiatrist removed [name] callus on the previous wound site to the lateral right [name] and there is [name] wound present. She currently denies signs of  infection. 12/15; patient presents for follow-up. We have been using Grafix to the wound bed. This was placed in standard fashion today. 12/21; patient presents for follow-up. We have been using Grafix to the wound bed. She completed her oral antibiotics. She has no issues or complaints today. 12/28; patient presents for follow-up. We have been using Grafix to the wound bed. She has reported no drainage. She is prepared for the total contact cast today. Her husband is present. 1/4; patient presents for follow-up. We use Grafix under the total contact cast at last clinic visit. Unfortunately her cast protector bag broke and the cast got wet in the shower. Her [name] is macerated today. She denies signs of infection. 1/12; patient presents for follow-up. At last clinic visit her [name] was macerated due to the total contact cast getting wet. This was not reapplied. She has been doing Medihoney and Hydrofera Blue to the wound bed. She is reported no drainage. 1/19; patient presents for follow-up. Her wound remains closed. She has noted no drainage. 04/14/2022 Patient presents with reopening of the right [name] plantar wound. She states it started out as [name] blister. She has been using Medihoney to the wound bed. She saw her podiatrist for this issue and was referred to our clinic. She has had [name] total contact cast for this same wound with success in healing. She currently denies signs of infection. 2/27; patient presents for follow-up. Plans for the total contact cast today. Patient has no issues or complaints she has been using Medihoney and Hydrofera Blue. 2/29; patient presents for follow-up. She tolerated the total contact cast well. We have been doing Hydrofera Blue and antibiotic ointment. 3/7; patient presents for follow-up. She has had no issues with the total contact cast. We have been doing Hydrofera  Blue and antibiotic ointment. There is been improvement in wound healing. 3/14; patient presents for  follow-up. The total contact cast was placed in standard fashion at last clinic visit. Unfortunately, when the cast was taken off it was noted she had developed [name] wound yet again to her lateral right [name]. She denies signs of infection. The plantar wound appears almost healed. Electronic Signature(s) Signed: 05/05/2022 3:34:03 PM By: Kalman Shan DO Entered By: Kalman Shan on 05/05/2022 09:50:02 -------------------------------------------------------------------------------- Physical Exam Details Patient Name: Date of Service: [name], [name] 05/05/2022 9:00 [name] Medical Record Number: [id number] Patient Account Number: [id number] Date of Birth/Sex: Treating RN: 1955-02-23 (67 y.o. F) Primary Care Provider: [name] Other Clinician: Referring Provider: Treating Provider/Extender: [name] in Treatment: 30 Constitutional respirations regular, non-labored and within target range for patient.. Cardiovascular 2+ dorsalis pedis/posterior tibialis pulses. Psychiatric pleasant and cooperative. Notes T the right plantar [name] there is [name] pinpoint open wound with granulation tissue. No signs of surrounding infection. T the lateral aspect there is an open wound o o with granulation tissue and nonviable tissue. No surrounding signs of infection here as well. Electronic Signature(s) Signed: 05/05/2022 3:34:03 PM By: Kalman Shan DO Entered By: Kalman Shan on 05/05/2022 09:50:45 Physician Orders Details -------------------------------------------------------------------------------- Harold Hedge ([id number]) [id number] Page 4 of 11 Patient Name: Date of Service: [name], [name] 05/05/2022 9:00 [place] Record Number: [id number] Patient Account Number: [id number] Date of Birth/Sex: Treating RN: 03-Dec-1955 (67 y.o. [name] Primary Care Provider: [name] Other  Clinician: Referring Provider: Treating Provider/Extender: [name] in Treatment: 30 Verbal / Phone Orders: No Diagnosis Coding Follow-up Appointments ppointment in 1 week. - Dr. Heber [place] Return [name] Cellular or [employer] Other Cellular or [employer] Based [employer] Orders/Instructions: - check insurance for Grafix Bathing/ Shower/ Hygiene May shower with protection but do not get wound dressing(s) wet. Protect dressing(s) with water repellant cover (for example, large plastic bag) or [name] cast cover and may then take shower. Wound Treatment Wound #6 - [name] Wound Laterality: Plantar, Right Cleanser: Wound Cleanser 1 x Per Day/30 Days Discharge Instructions: Cleanse the wound with wound cleanser prior to applying [name] clean dressing using gauze sponges, not tissue or cotton balls. Prim Dressing: MediHoney Gel, tube 1.5 (oz) 1 x Per Day/30 Days ary Discharge Instructions: Apply to wound bed as instructed Secondary Dressing: Optifoam Non-Adhesive Dressing, 4x4 in 1 x Per Day/30 Days Discharge Instructions: Apply over primary dressing as directed. Secondary Dressing: Woven Gauze Sponge, Non-Sterile 4x4 in 1 x Per Day/30 Days Discharge Instructions: Apply over primary dressing as directed. Secured With: Scientist, forensic, Sterile 4x75 (in/in) 1 x Per Day/30 Days Discharge Instructions: Secure with stretch gauze as directed. Secured With: Paper Tape, 2x10 (in/yd) 1 x Per Day/30 Days Discharge Instructions: Secure dressing with tape as directed. Wound #7 - [name] Wound Laterality: Right, Lateral Cleanser: Wound Cleanser 1 x Per Day/30 Days Discharge Instructions: Cleanse the wound with wound cleanser prior to applying [name] clean dressing using gauze sponges, not tissue or cotton balls. Prim Dressing: Hydrofera Blue Ready Transfer Foam, 2.5x2.5 (in/in) 1 x Per Day/30 Days ary Discharge Instructions: Apply directly to wound bed as directed Prim  Dressing: MediHoney Gel, tube 1.5 (oz) 1 x Per Day/30 Days ary Discharge Instructions: Apply to wound bed as instructed Secondary Dressing: Optifoam Non-Adhesive Dressing, 4x4 in 1 x Per Day/30 Days Discharge Instructions: Apply  over primary dressing as directed. Secondary Dressing: Woven Gauze Sponge, Non-Sterile 4x4 in 1 x Per Day/30 Days Discharge Instructions: Apply over primary dressing as directed. Secured With: Scientist, forensic, Sterile 4x75 (in/in) 1 x Per Day/30 Days Discharge Instructions: Secure with stretch gauze as directed. Secured With: Paper Tape, 2x10 (in/yd) 1 x Per Day/30 Days Discharge Instructions: Secure dressing with tape as directed. Electronic Signature(s) Signed: 05/05/2022 3:34:03 PM By: Kalman Shan DO Entered By: Kalman Shan on 05/05/2022 09:50:53 -------------------------------------------------------------------------------- Problem List Details Patient Name: Date of Service: [name], [name] 05/05/2022 9:00 [name] Medical Record Number: [id number] Patient Account Number: [id number] Date of Birth/Sex: Treating RN: 01-20-1956 (67 y.o. 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 05/05/2022 9:00 [name] Medical Record Number: [id number] Patient Account Number: [id number] Date of Birth/Sex: Treating RN: Jun 27, 1955 (67 y.o. F) Primary Care Provider: [name] Other Clinician: Referring Provider: Treating Provider/Extender: [name] in Treatment: 30 Subjective Chief Complaint Information obtained from Patient 10/27; Right Plantar [name] wound 11/21; 2 small areas of skin breakdown to the right [name] following cast placement 8/29; right plantar [name] wound History of Present Illness (HPI) Admission 12/17/2020 Ms. Chessie Benkert is [name] 67 year old female with [name] past medical history of insulin-dependent type 2 diabetes, hypothyroidism and daily1 pack per day cigarette smoker the presents to the clinic for [name] 6-week history of nonhealing wound to the right first MTPJ. She has been following with [name], podiatry for this issue. She has been using silver alginate with dressing changes. She uses [name] postsurgical shoe and offloading pads. She currently denies signs of infection. 10/31; patient presents for follow-up. She tolerated the soft cast fine although she states that she felt her [name] rolling to one side. She denies signs of infection. She would like to do the total contact cast today. 12/23/2020 upon evaluation today patient appears to be doing excellent in regard to her wound on the [name] and she is in [name] total contact cast. I do think this is appropriate this is the first cast change which we are obliged to do to ensure nothing is rubbing everything appears to be doing quite well and very pleased in that regard. 11/7; patient presents for follow-up. She had no issues with the cast. She denies signs of infection. 11/14; patient presents for 1 week follow-up. She has had  no issues with the cast. She denies signs of infection. 11/21; patient presents for 1 week follow-up. She did develop 2 small areas of skin breakdown on either side of the right [name] from the cast rubbing. She states she did not feel the wounds developing. She currently denies signs of infection. 11/28; patient presents for 1 week follow-up. She has no issues or complaints today. She denies pain or acute signs of infection. 12/12; patient presents for follow-up.  She reports improvement to her right lateral [name] wound. She has been using silver alginate to the area. She has no issues or complaints today. 12/19; patient presents for follow-up. She reports that her right lateral [name] wound has healed. She has no issues or complaints today. 1/3; patient presents for follow-up. She has no issues or complaints today. She reports no open wounds. [name], [name] ([id number]) [id number] Page 6 of 11 Readmission 10/01/2021 Ms. Ewelina Arciero is [name] 67 year old female's with [name] past medical history of insulin-dependent controlled type 2 diabetes complicated by peripheral neuropathy that presents to the clinic for [name] 89-month history of nonhealing ulcer to the right [name]. I have seen her before for [name] wound to the same area that was treated and healed. Her wound today however this is much deeper and it has thick yellow drainage. She is not sure exactly how it started. She noticed it 1 day. She has been following with [name] for this issue. She had [name] wound culture that showed [name] mix of organisms on 09/01/2021. She was recently started on doxycycline. She is using the Conservation officer, nature. She has been using Iodosorb with dressing changes. She currently denies systemic signs of infection. 8/21; patient presents for follow-up. She states she started and completed levofloxacin. She is still taking doxycycline. She reports improvement in drainage and odor. She has been doing Dakin's wet-to-dry  dressings as well. She is using her front offloading shoe. She denies signs of infection. 8/29; patient presents for follow-up. She is still taking doxycycline. She continues to report improvement in drainage and reports no odor. She denies any purulent drainage. She has been doing silver alginate to the wound bed and using her front offloading shoe. 9/5; patient presents for follow-up. She completed the course of levofloxacin. She has someone that is able to drive her today so we can place the total contact cast. She reports no signs of infection. We have been doing silver alginate to the wound bed. 9/7; patient presents for follow-up. She has tolerated the total contact cast well. She presents for her obligatory cast change. She has no issues or complaints today. 9/11; patient presents for follow-up. She tolerated the total contact cast well. She has no issues or complaints today. We discussed potentially doing [name] skin substitute and patient would like to see if her insurance will cover this. 9/18; patient presents for follow-up. She had no issues with the total contact cast. She been approved for Grafix and patient would like to have this placed today. 9/25; patient presents for follow-up. Grafix #1 was placed in standard fashion at last clinic visit. She has no issues or complaints today. She tolerated the cast well. 10/2; patient presents for follow-up. Grafix #2 was placed in standard fashion at last clinic visit. Unfortunately she developed [name] wound to the lateral aspect of the [name] over the past week. She states she has been on her feet [name] lot more and walking more. She denies systemic signs of infection. 10/9; patient presents for follow-up. Grafix #3 was placed in standard at last clinic visit. The plantar [name] wound has healed with this. She developed [name] new wound to the lateral aspect last week and this has gotten larger. She reports soreness to this area. She denies systemic signs of  infection. 10/16; patient presents for follow-up. Her plantar wound continues to remain closed. Her lateral wound has improved with the use of Hydrofera Blue. She has no issues or complaints today. She denies signs of infection.  She has almost completed her course of antibiotics prescribed at last clinic visit. 10/23; patient presents for follow-up. She has been using Hydrofera Blue and Medihoney to the wound bed. She has no issues or complaints today. She has been using her surgical shoe. 10/30; patient presents for follow-up. We have been using Hydrofera Blue and Medihoney to the wound bed. Patient has no issues or complaints today. 11/10; patient presents for follow-up. She has been using Hydrofera Blue and Medihoney without issues. She has been wearing her Orthotics. It is unclear if she is offloading this area or not. 11/17; patient presents for follow-up. She has been using Hydrofera Blue and Medihoney to the [name] wound without issues. Grafix was available today for placement and patient would like to proceed with this. 12/4; patient presents for follow-up. Grafix was placed at last clinic visit. Her wound is healed. She has no issues or complaints today. She is reported no drainage from the previous wound site. 12/8; patient states that her podiatrist removed [name] callus on the previous wound site to the lateral right [name] and there is [name] wound present. She currently denies signs of infection. 12/15; patient presents for follow-up. We have been using Grafix to the wound bed. This was placed in standard fashion today. 12/21; patient presents for follow-up. We have been using Grafix to the wound bed. She completed her oral antibiotics. She has no issues or complaints today. 12/28; patient presents for follow-up. We have been using Grafix to the wound bed. She has reported no drainage. She is prepared for the total contact cast today. Her husband is present. 1/4; patient presents for follow-up. We  use Grafix under the total contact cast at last clinic visit. Unfortunately her cast protector bag broke and the cast got wet in the shower. Her [name] is macerated today. She denies signs of infection. 1/12; patient presents for follow-up. At last clinic visit her [name] was macerated due to the total contact cast getting wet. This was not reapplied. She has been doing Medihoney and Hydrofera Blue to the wound bed. She is reported no drainage. 1/19; patient presents for follow-up. Her wound remains closed. She has noted no drainage. 04/14/2022 Patient presents with reopening of the right [name] plantar wound. She states it started out as [name] blister. She has been using Medihoney to the wound bed. She saw her podiatrist for this issue and was referred to our clinic. She has had [name] total contact cast for this same wound with success in healing. She currently denies signs of infection. 2/27; patient presents for follow-up. Plans for the total contact cast today. Patient has no issues or complaints she has been using Medihoney and Hydrofera Blue. 2/29; patient presents for follow-up. She tolerated the total contact cast well. We have been doing Hydrofera Blue and antibiotic ointment. 3/7; patient presents for follow-up. She has had no issues with the total contact cast. We have been doing Hydrofera Blue and antibiotic ointment. There is been improvement in wound healing. 3/14; patient presents for follow-up. The total contact cast was placed in standard fashion at last clinic visit. Unfortunately, when the cast was taken off it was noted she had developed [name] wound yet again to her lateral right [name]. She denies signs of infection. The plantar wound appears almost healed. Patient History Information obtained from Patient, Chart. Family History Unknown History. [name], [name] ([id number]) [id number] Page 7 of 11 Social History Current every day smoker - 1 pack/[name]/day, Alcohol  Use - Never, Drug  Use - No History, Caffeine Use - Moderate. Medical History Eyes Denies history of Cataracts, Glaucoma, Optic Neuritis Ear/Nose/Mouth/Throat Denies history of Chronic sinus problems/congestion, Middle ear problems Hematologic/Lymphatic Denies history of Anemia, Hemophilia, Human Immunodeficiency Virus, Lymphedema, Sickle Cell Disease Respiratory Patient has history of Sleep Apnea Denies history of Aspiration, Asthma, Chronic Obstructive Pulmonary Disease (COPD), Pneumothorax, Tuberculosis Cardiovascular Patient has history of Coronary Artery Disease, Hypertension Denies history of Angina, Arrhythmia, Congestive Heart Failure, Deep Vein Thrombosis, Hypotension, Myocardial Infarction, Peripheral Arterial Disease, Peripheral Venous Disease, Phlebitis, Vasculitis Gastrointestinal Denies history of Cirrhosis , Colitis, Crohnoos, Hepatitis [name], Hepatitis B, Hepatitis C Endocrine Patient has history of Type II Diabetes Denies history of Type I Diabetes Genitourinary Denies history of End Stage Renal Disease Immunological Denies history of Lupus Erythematosus, Raynaudoos, Scleroderma Integumentary (Skin) Denies history of History of Burn Musculoskeletal Denies history of Gout, Rheumatoid Arthritis, Osteoarthritis, Osteomyelitis Neurologic Patient has history of Neuropathy Denies history of Dementia, Quadriplegia, Paraplegia, Seizure Disorder Hospitalization/Surgery History - 2nd and 3rd right toe amps. - total hysterectomy. - cholecystectomy. - 2 knee surgery's on left and 1 on right. Medical [name] Surgical History Notes nd Cardiovascular hypercholesterolemia Objective Constitutional respirations regular, non-labored and within target range for patient.. Vitals Time Taken: 8:55 AM, Temperature: 97.8 F, Pulse: 78 bpm, Respiratory Rate: 18 breaths/min, Blood Pressure: 126/66 mmHg, Capillary Blood Glucose: 158 mg/dl. Cardiovascular 2+ dorsalis pedis/posterior tibialis  pulses. Psychiatric pleasant and cooperative. General Notes: T the right plantar [name] there is [name] pinpoint open wound with granulation tissue. No signs of surrounding infection. T the lateral aspect there is o o an open wound with granulation tissue and nonviable tissue. No surrounding signs of infection here as well. Integumentary (Hair, Skin) Wound #6 status is Open. Original cause of wound was Blister. The date acquired was: 04/14/2022. The wound has been in treatment 3 weeks. The wound is located on the [place]. The wound measures 0.2cm length x 0.2cm width x 0.2cm depth; 0.031cm^2 area and 0.006cm^3 volume. There is Fat Layer (Subcutaneous Tissue) exposed. There is no tunneling or undermining noted. There is [name] medium amount of serosanguineous drainage noted. The wound margin is distinct with the outline attached to the wound base. There is large (67-100%) red granulation within the wound bed. The periwound skin appearance had no abnormalities noted for moisture. The periwound skin appearance had no abnormalities noted for color. The periwound skin appearance exhibited: Callus. Periwound temperature was noted as No Abnormality. Wound #7 status is Open. Original cause of wound was Gradually Appeared. The date acquired was: 05/05/2022. The wound is located on the Right,Lateral [name]. The wound measures 0.4cm length x 0.4cm width x 0.3cm depth; 0.126cm^2 area and 0.038cm^3 volume. There is Fat Layer (Subcutaneous Tissue) exposed. There is no tunneling noted, however, there is undermining starting at 12:00 and ending at 12:00 with [name] maximum distance of 0.5cm. There is [name] medium amount of serosanguineous drainage noted. The wound margin is distinct with the outline attached to the wound base. There is large (67-100%) red granulation within the wound bed. There is no necrotic tissue within the wound bed. The periwound skin appearance had no abnormalities noted for moisture. The periwound  skin appearance had no abnormalities noted for color. The periwound skin appearance exhibited: Callus. Periwound temperature was noted as No Abnormality. 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 We have been using Hydrofera Blue here with antibiotic ointment under the cast. Unfortunately she has developed [name] new wound to the lateral right [name] despite conscious padding to this area. I debrided nonviable tissue here. I recommended taking [name] break from the total contact cast. She can use Hydrofera Blue and Medihoney to the lateral right [name] and Medihoney to the pinpoint open wound on the plantar aspect. Continue with surgical shoe for offloading. She has done well with Grafix in the past and we have been able to heal her wounds with this as well. Will see if she will qualify again for the skin substitute. Procedures Wound #7 Pre-procedure diagnosis of Wound #7 is [name] Diabetic Wound/Ulcer of the Lower Extremity located on the Right,Lateral [name] .Severity of Tissue Pre Debridement is: Fat layer exposed. There was [name] Excisional Skin/Subcutaneous Tissue Debridement with [name] total area of 0.16 sq cm performed by Kalman Shan, DO. With the following instrument(s): Curette to remove Non-Viable tissue/material. Material removed includes Subcutaneous Tissue and Slough and. No specimens were taken. [name] time out was conducted at 09:16, prior to the start of the procedure. [name] Minimum amount of bleeding was controlled with Pressure. The procedure was tolerated well. Post Debridement Measurements: 0.4cm length x 0.4cm width x 0.3cm depth; 0.038cm^3 volume. Character of  Wound/Ulcer Post Debridement is improved. Severity of Tissue Post Debridement is: Fat layer exposed. Post procedure Diagnosis Wound #7: Same as Pre-Procedure General Notes: scribed for Dr. Heber [place] by [name] Peals, RN. Plan Follow-up Appointments: Return Appointment in 1 week. - Dr. Heber [place] Cellular or Tissue Based Products: Other Cellular or Tissue Based Products Orders/Instructions: - check insurance for Grafix Bathing/ Shower/ Hygiene: May shower with protection but do not get wound dressing(s) wet. Protect dressing(s) with water repellant cover (for example, large plastic bag) or [name] cast cover and may then take shower. WOUND #6: - [name] Wound Laterality: Plantar, Right Cleanser: Wound Cleanser 1 x Per Day/30 Days Discharge Instructions: Cleanse the wound with wound cleanser prior to applying [name] clean dressing using gauze sponges, not tissue or cotton balls. Prim Dressing: MediHoney Gel, tube 1.5 (oz) 1 x Per Day/30 Days ary Discharge Instructions: Apply to wound bed as instructed Secondary Dressing: Optifoam Non-Adhesive Dressing, 4x4 in 1 x Per Day/30 Days Discharge Instructions: Apply over primary dressing as directed. Secondary Dressing: Woven Gauze Sponge, Non-Sterile 4x4 in 1 x Per Day/30 Days Discharge Instructions: Apply over primary dressing as directed. Secured With: Scientist, forensic, Sterile 4x75 (in/in) 1 x Per Day/30 Days Discharge Instructions: Secure with stretch gauze as directed. Secured With: Paper Tape, 2x10 (in/yd) 1 x Per Day/30 Days Discharge Instructions: Secure dressing with tape as directed. WOUND #7: - [name] Wound Laterality: Right, Lateral Cleanser: Wound Cleanser 1 x Per Day/30 Days Discharge Instructions: Cleanse the wound with wound cleanser prior to applying [name] clean dressing using gauze sponges, not tissue or cotton balls. Prim Dressing: Hydrofera Blue Ready Transfer Foam, 2.5x2.5 (in/in) 1 x Per Day/30 Days ary Discharge  Instructions: Apply directly to wound bed as directed Prim Dressing: MediHoney Gel, tube 1.5 (oz) 1 x Per Day/30 Days ary Discharge Instructions: Apply to wound bed as instructed Secondary Dressing: Optifoam Non-Adhesive Dressing, 4x4 in 1 x Per Day/30 Days Discharge Instructions: Apply over primary dressing as directed. Secondary Dressing: Woven Gauze Sponge, Non-Sterile 4x4 in 1 x Per Day/30 Days Discharge Instructions: Apply over  primary dressing as directed. Secured With: Scientist, forensic, Sterile 4x75 (in/in) 1 x Per Day/30 Days Discharge Instructions: Secure with stretch gauze as directed. Secured With: Paper Tape, 2x10 (in/yd) 1 x Per Day/30 Days Discharge Instructions: Secure dressing with tape as directed. 1. In office sharp debridement 2. Aggressive offloadingoosurgical shoe 3. Medihoney 4. Hydrofera Blue 5. Run IVR for Grafix 6. Follow-up in 1 week Electronic Signature(s) Signed: 05/05/2022 3:34:03 PM By: Kalman Shan DO Entered By: Kalman Shan on 05/05/2022 09:53:12 [name], [name] ([id number]) [id number] Page 9 of 11 -------------------------------------------------------------------------------- HxROS Details Patient Name: Date of Service: [name], [name] 05/05/2022 9:00 [place] Record Number: [id number] Patient Account Number: [id number] Date of Birth/Sex: Treating RN: 1955-04-09 (67 y.o. F) Primary Care Provider: [name] Other Clinician: Referring Provider: Treating Provider/Extender: [name] in Treatment: 30 Information Obtained From Patient Chart Eyes Medical History: Negative for: Cataracts; Glaucoma; Optic Neuritis Ear/Nose/Mouth/Throat Medical History: Negative for: Chronic sinus problems/congestion; Middle ear problems Hematologic/Lymphatic Medical History: Negative for: Anemia; Hemophilia; Human Immunodeficiency Virus; Lymphedema;  Sickle Cell Disease Respiratory Medical History: Positive for: Sleep Apnea Negative for: Aspiration; Asthma; Chronic Obstructive Pulmonary Disease (COPD); Pneumothorax; Tuberculosis Cardiovascular Medical History: Positive for: Coronary Artery Disease; Hypertension Negative for: Angina; Arrhythmia; Congestive Heart Failure; Deep Vein Thrombosis; Hypotension; Myocardial Infarction; Peripheral Arterial Disease; Peripheral Venous Disease; Phlebitis; Vasculitis Past Medical History Notes: hypercholesterolemia Gastrointestinal Medical History: Negative for: Cirrhosis ; Colitis; Crohns; Hepatitis [name]; Hepatitis B; Hepatitis C Endocrine Medical History: Positive for: Type II Diabetes Negative for: Type I Diabetes Genitourinary Medical History: Negative for: End Stage Renal Disease Immunological Medical History: Negative for: Lupus Erythematosus; Raynauds; Scleroderma Integumentary (Skin) Medical History: Negative for: History of Burn Musculoskeletal Medical History: Negative for: Gout; Rheumatoid Arthritis; Osteoarthritis; Osteomyelitis Neurologic [name], [name] ([id number]) [id number] Page 10 of 11 Medical History: Positive for: Neuropathy Negative for: Dementia; Quadriplegia; Paraplegia; Seizure Disorder Immunizations Pneumococcal Vaccine: Received Pneumococcal Vaccination: Yes Received Pneumococcal Vaccination On or After 60th Birthday: Yes Tetanus Vaccine: Last tetanus shot: 12/17/2020 Implantable Devices No devices added Hospitalization / Surgery History Type of Hospitalization/Surgery 2nd and 3rd right toe amps total hysterectomy cholecystectomy 2 knee surgery's on left and 1 on right Family and Social History Unknown History: Yes; Current every day smoker - 1 pack/[name]/day; Alcohol Use: Never; Drug Use: No History; Caffeine Use: Moderate; Financial Concerns: No; Food, Clothing or Shelter Needs: No; Support System Lacking: No;  Transportation Concerns: No Electronic Signature(s) Signed: 05/05/2022 3:34:03 PM By: Kalman Shan DO Entered By: Kalman Shan on 05/05/2022 09:50:09 -------------------------------------------------------------------------------- SuperBill Details Patient Name: Date of Service: [name], [name] 05/05/2022 Medical Record Number: [id number] Patient Account Number: [id number] Date of Birth/Sex: Treating RN: 09-23-1955 (67 y.o. 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 E11.621 Type 2 diabetes mellitus with [name] ulcer E11.42 Type 2 diabetes mellitus with  diabetic polyneuropathy Modifier: Quantity: 1 Physician Procedures : CPT4 Code Description Modifier [id number] - WC PHYS SUBQ TISS 20 SQ CM  ICD-10 Diagnosis Description L97.512 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

## 2022-05-12 ENCOUNTER — Encounter (HOSPITAL_BASED_OUTPATIENT_CLINIC_OR_DEPARTMENT_OTHER): Payer: Medicare Other | Admitting: Internal Medicine

## 2022-05-12 DIAGNOSIS — E1142 Type 2 diabetes mellitus with diabetic polyneuropathy: Secondary | ICD-10-CM | POA: Diagnosis not present

## 2022-05-12 DIAGNOSIS — L97512 Non-pressure chronic ulcer of other part of right foot with fat layer exposed: Secondary | ICD-10-CM | POA: Diagnosis not present

## 2022-05-12 DIAGNOSIS — E11621 Type 2 diabetes mellitus with foot ulcer: Secondary | ICD-10-CM | POA: Diagnosis not present

## 2022-05-12 DIAGNOSIS — F17210 Nicotine dependence, cigarettes, uncomplicated: Secondary | ICD-10-CM | POA: Diagnosis not present

## 2022-05-12 DIAGNOSIS — Z794 Long term (current) use of insulin: Secondary | ICD-10-CM | POA: Diagnosis not present

## 2022-05-12 DIAGNOSIS — E039 Hypothyroidism, unspecified: Secondary | ICD-10-CM | POA: Diagnosis not present

## 2022-05-14 NOTE — Progress Notes (Signed)
[name] [name], [name] [name] ([id number]) [id number] Page 1 of 9 Visit Report for 05/12/2022 Chief Complaint Document Details Patient Name: Date of Service: [name] [name] [name] 05/12/2022 9:30 A M Medical Record Number: [id number] Patient Account Number: [id number] Date of Birth/Sex: Treating RN: [birth year] (67 y.o. F) Primary Care Provider: Geoffery [name] Other Clinician: Referring Provider: Treating Provider/Extender: [name] [name] in Treatment: 31 Information Obtained from: Patient Chief Complaint 10/27; Right Plantar foot wound 11/21; 2 small areas of skin breakdown to the right foot following cast placement 8/29; right plantar foot wound Electronic Signature(s) Signed: 05/12/2022 1:29:58 PM By: [name] Shan DO Entered By: [name] [name] on 05/12/2022 10:18:29 -------------------------------------------------------------------------------- HPI Details Patient Name: Date of Service: [name] [name] [name] 05/12/2022 9:30 A M Medical Record Number: [id number] Patient Account Number: [id number] Date of Birth/Sex: Treating RN: [birth year] (67 y.o. F) Primary Care Provider: Geoffery [name] Other Clinician: Referring Provider: Treating Provider/Extender: [name] [name] in Treatment: 31 History of Present Illness HPI Description: Admission 12/17/2020 Ms. [name] [name] is a 67 year old female with a past medical history of insulin-dependent type 2 diabetes, hypothyroidism and daily1 pack per day cigarette smoker the presents to the clinic for a 6-week history of nonhealing wound to the right first MTPJ. She has been following with [name] [name], podiatry for this issue. She has been using silver alginate with dressing changes. She uses a postsurgical shoe and offloading pads. She currently denies signs of infection. 10/31; patient presents for follow-up. She tolerated the soft cast fine although she  states that she felt her foot rolling to one side. She denies signs of infection. She would like to do the total contact cast today. 12/23/2020 upon evaluation today patient appears to be doing excellent in regard to her wound on the foot and she is in a total contact cast. I do think this is appropriate this is the first cast change which we are obliged to do to ensure nothing is rubbing everything appears to be doing quite well and very pleased in that regard. 11/7; patient presents for follow-up. She had no issues with the cast. She denies signs of infection. 11/14; patient presents for 1 week follow-up. She has had no issues with the cast. She denies signs of infection. 11/21; patient presents for 1 week follow-up. She did develop 2 small areas of skin breakdown on either side of the right foot from the cast rubbing. She states she did not feel the wounds developing. She currently denies signs of infection. 11/28; patient presents for 1 week follow-up. She has no issues or complaints today. She denies pain or acute signs of infection. 12/12; patient presents for follow-up. She reports improvement to her right lateral foot wound. She has been using silver alginate to the area. She has no issues or complaints today. 12/19; patient presents for follow-up. She reports that her right lateral foot wound has healed. She has no issues or complaints today. 1/3; patient presents for follow-up. She has no issues or complaints today. She reports no open wounds. Readmission 10/01/2021 Ms. [name] [name] is a 67 year old female's with a past medical history of insulin-dependent controlled type 2 diabetes complicated by peripheral neuropathy that presents to the clinic for a 76-month history of nonhealing ulcer to the right foot. I have seen her before for a wound to the same area that was treated and healed. Her wound today however this is much deeper and it has thick yellow drainage. She  is not sure exactly how  it started. She noticed it 1 day. She has been following with [name] [name] for this issue. She had a wound culture that showed a mix of organisms on 09/01/2021. She was recently started on doxycycline. She is using the Conservation officer, nature. She has been using Iodosorb with dressing changes. She currently denies systemic signs of infection. 8/21; patient presents for follow-up. She states she started and completed levofloxacin. She is still taking doxycycline. She reports improvement in drainage and odor. She has been doing Dakin's wet-to-dry dressings as well. She is using her front offloading shoe. She denies signs of infection. 8/29; patient presents for follow-up. She is still taking doxycycline. She continues to report improvement in drainage and reports no odor. She denies any MONIC, PLUMADORE ([id number]) [id number] Page 2 of 9 purulent drainage. She has been doing silver alginate to the wound bed and using her front offloading shoe. 9/5; patient presents for follow-up. She completed the course of levofloxacin. She has someone that is able to drive her today so we can place the total contact cast. She reports no signs of infection. We have been doing silver alginate to the wound bed. 9/7; patient presents for follow-up. She has tolerated the total contact cast well. She presents for her obligatory cast change. She has no issues or complaints today. 9/11; patient presents for follow-up. She tolerated the total contact cast well. She has no issues or complaints today. We discussed potentially doing a skin substitute and patient would like to see if her insurance will cover this. 9/18; patient presents for follow-up. She had no issues with the total contact cast. She been approved for Grafix and patient would like to have this placed today. 9/25; patient presents for follow-up. Grafix #1 was placed in standard fashion at last clinic visit. She has no issues or complaints today.  She tolerated the cast well. 10/2; patient presents for follow-up. Grafix #2 was placed in standard fashion at last clinic visit. Unfortunately she developed a wound to the lateral aspect of the foot over the past week. She states she has been on her feet a lot more and walking more. She denies systemic signs of infection. 10/9; patient presents for follow-up. Grafix #3 was placed in standard at last clinic visit. The plantar foot wound has healed with this. She developed a new wound to the lateral aspect last week and this has gotten larger. She reports soreness to this area. She denies systemic signs of infection. 10/16; patient presents for follow-up. Her plantar wound continues to remain closed. Her lateral wound has improved with the use of Hydrofera Blue. She has no issues or complaints today. She denies signs of infection. She has almost completed her course of antibiotics prescribed at last clinic visit. 10/23; patient presents for follow-up. She has been using Hydrofera Blue and Medihoney to the wound bed. She has no issues or complaints today. She has been using her surgical shoe. 10/30; patient presents for follow-up. We have been using Hydrofera Blue and Medihoney to the wound bed. Patient has no issues or complaints today. 11/10; patient presents for follow-up. She has been using Hydrofera Blue and Medihoney without issues. She has been wearing her Orthotics. It is unclear if she is offloading this area or not. 11/17; patient presents for follow-up. She has been using Hydrofera Blue and Medihoney to the foot wound without issues. Grafix was available today for placement and patient would like to proceed with this. 12/4; patient presents  for follow-up. Grafix was placed at last clinic visit. Her wound is healed. She has no issues or complaints today. She is reported no drainage from the previous wound site. 12/8; patient states that her podiatrist removed a callus on the previous wound  site to the lateral right foot and there is a wound present. She currently denies signs of infection. 12/15; patient presents for follow-up. We have been using Grafix to the wound bed. This was placed in standard fashion today. 12/21; patient presents for follow-up. We have been using Grafix to the wound bed. She completed her oral antibiotics. She has no issues or complaints today. 12/28; patient presents for follow-up. We have been using Grafix to the wound bed. She has reported no drainage. She is prepared for the total contact cast today. Her husband is present. 1/4; patient presents for follow-up. We use Grafix under the total contact cast at last clinic visit. Unfortunately her cast protector bag broke and the cast got wet in the shower. Her foot is macerated today. She denies signs of infection. 1/12; patient presents for follow-up. At last clinic visit her foot was macerated due to the total contact cast getting wet. This was not reapplied. She has been doing Medihoney and Hydrofera Blue to the wound bed. She is reported no drainage. 1/19; patient presents for follow-up. Her wound remains closed. She has noted no drainage. 04/14/2022 Patient presents with reopening of the right foot plantar wound. She states it started out as a blister. She has been using Medihoney to the wound bed. She saw her podiatrist for this issue and was referred to our clinic. She has had a total contact cast for this same wound with success in healing. She currently denies signs of infection. 2/27; patient presents for follow-up. Plans for the total contact cast today. Patient has no issues or complaints she has been using Medihoney and Hydrofera Blue. 2/29; patient presents for follow-up. She tolerated the total contact cast well. We have been doing Hydrofera Blue and antibiotic ointment. 3/7; patient presents for follow-up. She has had no issues with the total contact cast. We have been doing Hydrofera Blue and  antibiotic ointment. There is been improvement in wound healing. 3/14; patient presents for follow-up. The total contact cast was placed in standard fashion at last clinic visit. Unfortunately, when the cast was taken off it was noted she had developed a wound yet again to her lateral right foot. She denies signs of infection. The plantar wound appears almost healed. 3/21; patient presents for follow-up. She has been using the Medihoney and Hydrofera Blue to the wound beds. Her wounds of healed. Electronic Signature(s) Signed: 05/12/2022 1:29:58 PM By: [name] Shan DO Entered By: [name] [name] on 05/12/2022 10:19:07 -------------------------------------------------------------------------------- Physical Exam Details Patient Name: Date of Service: [name] [name] [name] 05/12/2022 9:30 A M Medical Record Number: [id number] Patient Account Number: [id number] Date of Birth/Sex: Treating RN: [birth year] (67 y.o. Horton Marshall, Frannie [name] ([id number]) [id number] Page 3 of 9 Primary Care Provider: Geoffery [name] Other Clinician: Referring Provider: Treating Provider/Extender: [name] [name] in Treatment: 31 Constitutional respirations regular, non-labored and within target range for patient.. Cardiovascular 2+ dorsalis pedis/posterior tibialis pulses. Psychiatric pleasant and cooperative. Notes T the right plantar foot and lateral aspect there is epithelization to the previous wound site. No signs of infection. o Electronic Signature(s) Signed: 05/12/2022 1:29:58 PM By: [name] Shan DO Entered By: [name] [name] on 05/12/2022 10:19:58 -------------------------------------------------------------------------------- Physician Orders Details Patient Name:  Date of Service: [name] [name] [name] 05/12/2022 9:30 A M Medical Record Number: [id number] Patient Account Number: [id number] Date of Birth/Sex: Treating  RN: 1955-07-06 (67 y.o. F) Primary Care Provider: Geoffery [name] Other Clinician: Referring Provider: Treating Provider/Extender: [name] [name] in Treatment: 84 Verbal / Phone Orders: No Diagnosis Coding ICD-10 Coding Code Description L97.512 Non-pressure chronic ulcer of other part of right foot with fat layer exposed E11.621 Type 2 diabetes mellitus with foot ulcer E11.42 Type 2 diabetes mellitus with diabetic polyneuropathy Discharge From [facility] Services Discharge from [place] Signature(s) Signed: 05/12/2022 1:29:58 PM By: [name] Shan DO Signed: 05/13/2022 11:12:19 AM By: Rhae Hammock RN Entered By: Rhae Hammock on 05/12/2022 10:25:36 -------------------------------------------------------------------------------- Problem List Details Patient Name: Date of Service: [name] [name] [name] 05/12/2022 9:30 A M Medical Record Number: [id number] Patient Account Number: [id number] Date of Birth/Sex: Treating RN: 01/16/1956 (67 y.o. F) Primary Care Provider: Geoffery [name] Other Clinician: Referring Provider: Treating Provider/Extender: [name] [name] in Treatment: 31 Active Problems ICD-10 Encounter Code Description Active Date MDM Diagnosis L97.512 Non-pressure chronic ulcer of other part of right foot with fat layer exposed 10/01/2021 No Yes E11.621 Type 2 diabetes mellitus with foot ulcer 10/01/2021 No Yes [name] [name], [name] [name] ([id number]) [id number] Page 4 of 9 E11.42 Type 2 diabetes mellitus with diabetic polyneuropathy 10/01/2021 No Yes Inactive Problems Resolved Problems Electronic Signature(s) Signed: 05/12/2022 1:29:58 PM By: [name] Shan DO Entered By: [name] [name] on 05/12/2022 10:18:11 -------------------------------------------------------------------------------- Progress Note Details Patient Name: Date of Service: [name] [name]  [name] 05/12/2022 9:30 A M Medical Record Number: [id number] Patient Account Number: [id number] Date of Birth/Sex: Treating RN: 1955/07/08 (67 y.o. F) Primary Care Provider: Geoffery [name] Other Clinician: Referring Provider: Treating Provider/Extender: [name] [name] in Treatment: 31 Subjective Chief Complaint Information obtained from Patient 10/27; Right Plantar foot wound 11/21; 2 small areas of skin breakdown to the right foot following cast placement 8/29; right plantar foot wound History of Present Illness (HPI) Admission 12/17/2020 Ms. [name] [name] is a 67 year old female with a past medical history of insulin-dependent type 2 diabetes, hypothyroidism and daily1 pack per day cigarette smoker the presents to the clinic for a 6-week history of nonhealing wound to the right first MTPJ. She has been following with [name] [name], podiatry for this issue. She has been using silver alginate with dressing changes. She uses a postsurgical shoe and offloading pads. She currently denies signs of infection. 10/31; patient presents for follow-up. She tolerated the soft cast fine although she states that she felt her foot rolling to one side. She denies signs of infection. She would like to do the total contact cast today. 12/23/2020 upon evaluation today patient appears to be doing excellent in regard to her wound on the foot and she is in a total contact cast. I do think this is appropriate this is the first cast change which we are obliged to do to ensure nothing is rubbing everything appears to be doing quite well and very pleased in that regard. 11/7; patient presents for follow-up. She had no issues with the cast. She denies signs of infection. 11/14; patient presents for 1 week follow-up. She has had no issues with the cast. She denies signs of infection. 11/21; patient presents for 1 week follow-up. She did develop 2 small areas of skin breakdown on either side of  the right foot from the cast rubbing.  She states she did not feel the wounds developing. She currently denies signs of infection. 11/28; patient presents for 1 week follow-up. She has no issues or complaints today. She denies pain or acute signs of infection. 12/12; patient presents for follow-up. She reports improvement to her right lateral foot wound. She has been using silver alginate to the area. She has no issues or complaints today. 12/19; patient presents for follow-up. She reports that her right lateral foot wound has healed. She has no issues or complaints today. 1/3; patient presents for follow-up. She has no issues or complaints today. She reports no open wounds. Readmission 10/01/2021 Ms. [name] [name] is a 67 year old female's with a past medical history of insulin-dependent controlled type 2 diabetes complicated by peripheral neuropathy that presents to the clinic for a 29-month history of nonhealing ulcer to the right foot. I have seen her before for a wound to the same area that was treated and healed. Her wound today however this is much deeper and it has thick yellow drainage. She is not sure exactly how it started. She noticed it 1 day. She has been following with [name] [name] for this issue. She had a wound culture that showed a mix of organisms on 09/01/2021. She was recently started on doxycycline. She is using the Conservation officer, nature. She has been using Iodosorb with dressing changes. She currently denies systemic signs of infection. 8/21; patient presents for follow-up. She states she started and completed levofloxacin. She is still taking doxycycline. She reports improvement in drainage and odor. She has been doing Dakin's wet-to-dry dressings as well. She is using her front offloading shoe. She denies signs of infection. 8/29; patient presents for follow-up. She is still taking doxycycline. She continues to report improvement in drainage and reports no odor. She denies any purulent  drainage. She has been doing silver alginate to the wound bed and using her front offloading shoe. 9/5; patient presents for follow-up. She completed the course of levofloxacin. She has someone that is able to drive her today so we can place the total contact cast. She reports no signs of infection. We have been doing silver alginate to the wound bed. 9/7; patient presents for follow-up. She has tolerated the total contact cast well. She presents for her obligatory cast change. She has no issues or complaints today. [name] [name], [name] [name] ([id number]) [id number] Page 5 of 9 9/11; patient presents for follow-up. She tolerated the total contact cast well. She has no issues or complaints today. We discussed potentially doing a skin substitute and patient would like to see if her insurance will cover this. 9/18; patient presents for follow-up. She had no issues with the total contact cast. She been approved for Grafix and patient would like to have this placed today. 9/25; patient presents for follow-up. Grafix #1 was placed in standard fashion at last clinic visit. She has no issues or complaints today. She tolerated the cast well. 10/2; patient presents for follow-up. Grafix #2 was placed in standard fashion at last clinic visit. Unfortunately she developed a wound to the lateral aspect of the foot over the past week. She states she has been on her feet a lot more and walking more. She denies systemic signs of infection. 10/9; patient presents for follow-up. Grafix #3 was placed in standard at last clinic visit. The plantar foot wound has healed with this. She developed a new wound to the lateral aspect last week and this has gotten larger. She reports soreness to this area.  She denies systemic signs of infection. 10/16; patient presents for follow-up. Her plantar wound continues to remain closed. Her lateral wound has improved with the use of Hydrofera Blue. She has no issues  or complaints today. She denies signs of infection. She has almost completed her course of antibiotics prescribed at last clinic visit. 10/23; patient presents for follow-up. She has been using Hydrofera Blue and Medihoney to the wound bed. She has no issues or complaints today. She has been using her surgical shoe. 10/30; patient presents for follow-up. We have been using Hydrofera Blue and Medihoney to the wound bed. Patient has no issues or complaints today. 11/10; patient presents for follow-up. She has been using Hydrofera Blue and Medihoney without issues. She has been wearing her Orthotics. It is unclear if she is offloading this area or not. 11/17; patient presents for follow-up. She has been using Hydrofera Blue and Medihoney to the foot wound without issues. Grafix was available today for placement and patient would like to proceed with this. 12/4; patient presents for follow-up. Grafix was placed at last clinic visit. Her wound is healed. She has no issues or complaints today. She is reported no drainage from the previous wound site. 12/8; patient states that her podiatrist removed a callus on the previous wound site to the lateral right foot and there is a wound present. She currently denies signs of infection. 12/15; patient presents for follow-up. We have been using Grafix to the wound bed. This was placed in standard fashion today. 12/21; patient presents for follow-up. We have been using Grafix to the wound bed. She completed her oral antibiotics. She has no issues or complaints today. 12/28; patient presents for follow-up. We have been using Grafix to the wound bed. She has reported no drainage. She is prepared for the total contact cast today. Her husband is present. 1/4; patient presents for follow-up. We use Grafix under the total contact cast at last clinic visit. Unfortunately her cast protector bag broke and the cast got wet in the shower. Her foot is macerated today. She  denies signs of infection. 1/12; patient presents for follow-up. At last clinic visit her foot was macerated due to the total contact cast getting wet. This was not reapplied. She has been doing Medihoney and Hydrofera Blue to the wound bed. She is reported no drainage. 1/19; patient presents for follow-up. Her wound remains closed. She has noted no drainage. 04/14/2022 Patient presents with reopening of the right foot plantar wound. She states it started out as a blister. She has been using Medihoney to the wound bed. She saw her podiatrist for this issue and was referred to our clinic. She has had a total contact cast for this same wound with success in healing. She currently denies signs of infection. 2/27; patient presents for follow-up. Plans for the total contact cast today. Patient has no issues or complaints she has been using Medihoney and Hydrofera Blue. 2/29; patient presents for follow-up. She tolerated the total contact cast well. We have been doing Hydrofera Blue and antibiotic ointment. 3/7; patient presents for follow-up. She has had no issues with the total contact cast. We have been doing Hydrofera Blue and antibiotic ointment. There is been improvement in wound healing. 3/14; patient presents for follow-up. The total contact cast was placed in standard fashion at last clinic visit. Unfortunately, when the cast was taken off it was noted she had developed a wound yet again to her lateral right foot. She denies signs of  infection. The plantar wound appears almost healed. 3/21; patient presents for follow-up. She has been using the Medihoney and Hydrofera Blue to the wound beds. Her wounds of healed. Patient History Information obtained from Patient, Chart. Family History Unknown History. Social History Current every day smoker - 1 pack/a/day, Alcohol Use - Never, Drug Use - No History, Caffeine Use - Moderate. Medical History Eyes Denies history of Cataracts, Glaucoma, Optic  Neuritis Ear/Nose/Mouth/Throat Denies history of Chronic sinus problems/congestion, Middle ear problems Hematologic/Lymphatic Denies history of Anemia, Hemophilia, Human Immunodeficiency Virus, Lymphedema, Sickle Cell Disease Respiratory Patient has history of Sleep Apnea Denies history of Aspiration, Asthma, Chronic Obstructive Pulmonary Disease (COPD), Pneumothorax, Tuberculosis Cardiovascular Patient has history of Coronary Artery Disease, Hypertension Denies history of Angina, Arrhythmia, Congestive Heart Failure, Deep Vein Thrombosis, Hypotension, Myocardial Infarction, Peripheral Arterial Disease, Peripheral Venous Disease, Phlebitis, Vasculitis [name] [name], [name] [name] ([id number]) [id number] Page 6 of 9 Gastrointestinal Denies history of Cirrhosis , Colitis, Crohnoos, Hepatitis A, Hepatitis B, Hepatitis C Endocrine Patient has history of Type II Diabetes Denies history of Type I Diabetes Genitourinary Denies history of End Stage Renal Disease Immunological Denies history of Lupus Erythematosus, Raynaudoos, Scleroderma Integumentary (Skin) Denies history of History of Burn Musculoskeletal Denies history of Gout, Rheumatoid Arthritis, Osteoarthritis, Osteomyelitis Neurologic Patient has history of Neuropathy Denies history of Dementia, Quadriplegia, Paraplegia, Seizure Disorder Hospitalization/Surgery History - 2nd and 3rd right toe amps. - total hysterectomy. - cholecystectomy. - 2 knee surgery's on left and 1 on right. Medical A Surgical History Notes nd Cardiovascular hypercholesterolemia Objective Constitutional respirations regular, non-labored and within target range for patient.. Vitals Time Taken: 9:17 AM, Temperature: 97.7 F, Pulse: 73 bpm, Respiratory Rate: 18 breaths/min, Blood Pressure: 137/78 mmHg, Capillary Blood Glucose: 153 mg/dl. Cardiovascular 2+ dorsalis pedis/posterior tibialis pulses. Psychiatric pleasant and  cooperative. General Notes: T the right plantar foot and lateral aspect there is epithelization to the previous wound site. No signs of infection. o Integumentary (Hair, Skin) Wound #6 status is Open. Original cause of wound was Blister. The date acquired was: 04/14/2022. The wound has been in treatment 4 weeks. The wound is located on the [place]. The wound measures 0.1cm length x 0.1cm width x 0.1cm depth; 0.008cm^2 area and 0.001cm^3 volume. There is Fat Layer (Subcutaneous Tissue) exposed. There is no tunneling or undermining noted. There is a medium amount of serosanguineous drainage noted. The wound margin is distinct with the outline attached to the wound base. There is large (67-100%) red granulation within the wound bed. The periwound skin appearance had no abnormalities noted for moisture. The periwound skin appearance had no abnormalities noted for color. The periwound skin appearance exhibited: Callus. Periwound temperature was noted as No Abnormality. Wound #7 status is Open. Original cause of wound was Gradually Appeared. The date acquired was: 05/05/2022. The wound has been in treatment 1 weeks. The wound is located on the Right,Lateral Foot. The wound measures 0.1cm length x 0.1cm width x 0.1cm depth; 0.008cm^2 area and 0.001cm^3 volume. There is Fat Layer (Subcutaneous Tissue) exposed. There is no tunneling or undermining noted. There is a medium amount of serosanguineous drainage noted. The wound margin is distinct with the outline attached to the wound base. There is large (67-100%) red granulation within the wound bed. There is no necrotic tissue within the wound bed. The periwound skin appearance had no abnormalities noted for moisture. The periwound skin appearance had no abnormalities noted for color. The periwound skin appearance exhibited: Callus. Periwound temperature was noted as No Abnormality. Assessment Active  Problems ICD-10 Non-pressure chronic ulcer of  other part of right foot with fat layer exposed Type 2 diabetes mellitus with foot ulcer Type 2 diabetes mellitus with diabetic polyneuropathy Patient's wounds have done well with Hydrofera Blue and Medihoney. They have healed. I recommended she wear her surgical shoe and peg assist for the next 1 to 2 weeks to assure that there is further healing. Follow-up as needed. Plan [name] [name], [name] [name] ([id number]) [id number] Page 7 of 9 1. Follow-up as needed 2. Continue offloading for the next 1 to 2 weeks, surgical shoe with peg assistoogiven in office 3. Discharge from clinic due to closed wound Electronic Signature(s) Signed: 05/12/2022 1:29:58 PM By: [name] Shan DO Entered By: [name] [name] on 05/12/2022 10:21:03 -------------------------------------------------------------------------------- HxROS Details Patient Name: Date of Service: [name] [name] [name] 05/12/2022 9:30 A M Medical Record Number: [id number] Patient Account Number: [id number] Date of Birth/Sex: Treating RN: Mar 12, 1955 (67 y.o. F) Primary Care Provider: Geoffery [name] Other Clinician: Referring Provider: Treating Provider/Extender: [name] [name] in Treatment: 31 Information Obtained From Patient Chart Eyes Medical History: Negative for: Cataracts; Glaucoma; Optic Neuritis Ear/Nose/Mouth/Throat Medical History: Negative for: Chronic sinus problems/congestion; Middle ear problems Hematologic/Lymphatic Medical History: Negative for: Anemia; Hemophilia; Human Immunodeficiency Virus; Lymphedema; Sickle Cell Disease Respiratory Medical History: Positive for: Sleep Apnea Negative for: Aspiration; Asthma; Chronic Obstructive Pulmonary Disease (COPD); Pneumothorax; Tuberculosis Cardiovascular Medical History: Positive for: Coronary Artery Disease; Hypertension Negative for: Angina; Arrhythmia; Congestive Heart Failure; Deep Vein Thrombosis;  Hypotension; Myocardial Infarction; Peripheral Arterial Disease; Peripheral Venous Disease; Phlebitis; Vasculitis Past Medical History Notes: hypercholesterolemia Gastrointestinal Medical History: Negative for: Cirrhosis ; Colitis; Crohns; Hepatitis A; Hepatitis B; Hepatitis C Endocrine Medical History: Positive for: Type II Diabetes Negative for: Type I Diabetes Genitourinary Medical History: Negative for: End Stage Renal Disease Immunological Medical History: Negative for: Lupus Erythematosus; Raynauds; Scleroderma [name] [name], [name] [name] ([id number]) [id number] Page 8 of 9 Integumentary (Skin) Medical History: Negative for: History of Burn Musculoskeletal Medical History: Negative for: Gout; Rheumatoid Arthritis; Osteoarthritis; Osteomyelitis Neurologic Medical History: Positive for: Neuropathy Negative for: Dementia; Quadriplegia; Paraplegia; Seizure Disorder Immunizations Pneumococcal Vaccine: Received Pneumococcal Vaccination: Yes Received Pneumococcal Vaccination On or After 60th Birthday: Yes Tetanus Vaccine: Last tetanus shot: 12/17/2020 Implantable Devices No devices added Hospitalization / Surgery History Type of Hospitalization/Surgery 2nd and 3rd right toe amps total hysterectomy cholecystectomy 2 knee surgery's on left and 1 on right Family and Social History Unknown History: Yes; Current every day smoker - 1 pack/a/day; Alcohol Use: Never; Drug Use: No History; Caffeine Use: Moderate; Financial Concerns: No; Food, Clothing or Shelter Needs: No; Support System Lacking: No; Transportation Concerns: No Electronic Signature(s) Signed: 05/12/2022 1:29:58 PM By: [name] Shan DO Entered By: [name] [name] on 05/12/2022 10:19:20 -------------------------------------------------------------------------------- SuperBill Details Patient Name: Date of Service: [name] [name] [name] 05/12/2022 Medical Record Number:  [id number] Patient Account Number: [id number] Date of Birth/Sex: Treating RN: 04-Sep-1955 (67 y.o. F) Primary Care Provider: Geoffery [name] Other Clinician: Referring Provider: Treating Provider/Extender: [name] [name] in Treatment: 31 Diagnosis Coding ICD-10 Codes Code Description [phone] Non-pressure chronic ulcer of other part of right foot with fat layer exposed E11.621 Type 2 diabetes mellitus with foot ulcer E11.42 Type 2 diabetes mellitus with diabetic polyneuropathy Facility Procedures : CPT4 Code: [id number] Description: 99213 - WOUND CARE VISIT-LEV 3 EST PT Modifier: Quantity: 1 Physician Procedures : CPT4 Code Description Modifier [id number] - WC PHYS LEVEL 3 - EST PT ICD-10 Diagnosis Description L97.512  Non-pressure chronic ulcer of other part of right foot with fat layer exposed E11.621 Type 2 diabetes mellitus with foot ulcer [name] [name],  [name] [name] ([id number]) [id number] E11.42 Type 2 diabetes mellitus with diabetic polyneuropathy Quantity: 1 f Page 9 of 9 Electronic Signature(s) Signed: 05/12/2022 1:29:58 PM By: [name] Shan DO Signed: 05/13/2022 11:12:19 AM By: Rhae Hammock RN Entered By: Rhae Hammock on 05/12/2022 10:26:28

## 2022-05-16 NOTE — Progress Notes (Signed)
FREDIA, BEMUS ([id number]) [id number] Page 1 of 8 Visit Report for 05/12/2022 Arrival Information Details Patient Name: Date of Service: [name], [name] 05/12/2022 9:30 A M Medical Record Number: [id number] Patient Account Number: [id number] Date of Birth/Sex: Treating RN: 27-Apr-1955 (67 y.o. F) Primary Care Linsay Vogt: [name] Other Clinician: Referring Sherrill Mckamie: Treating Lavra Imler/Extender: [name] in Treatment: 11 Visit Information History Since Last Visit Added or deleted any medications: No Patient Arrived: Ambulatory Any new allergies or adverse reactions: No Arrival Time: 09:19 Had a fall or experienced change in No Accompanied By: self activities of daily living that may affect Transfer Assistance: None risk of falls: Patient Identification Verified: Yes Signs or symptoms of abuse/neglect since last visito No Secondary Verification Process Completed: Yes Hospitalized since last visit: No Patient Requires Transmission-Based Precautions: No Implantable device outside of the clinic excluding No Patient Has Alerts: No cellular tissue based products placed in the center since last visit: Pain Present Now: No Electronic Signature(s) Signed: 05/16/2022 4:16:21 PM By: Erenest Blank Entered By: Erenest Blank on 05/12/2022 09:20:26 -------------------------------------------------------------------------------- Clinic Level of Care Assessment Details Patient Name: Date of Service: [name], [name] 05/12/2022 9:30 A M Medical Record Number: [id number] Patient Account Number: [id number] Date of Birth/Sex: Treating RN: 07-13-1955 (67 y.o. [name], [name] Primary Care Xiong Haidar: [name] Other Clinician: Referring Chaun Uemura: Treating Castulo Scarpelli/Extender: [name] in Treatment: 31 Clinic Level of Care Assessment Items TOOL 4 Quantity Score X- 1  0 Use when only an EandM is performed on FOLLOW-UP visit ASSESSMENTS - Nursing Assessment / Reassessment X- 1 10 Reassessment of Co-morbidities (includes updates in patient status) X- 1 5 Reassessment of Adherence to Treatment Plan ASSESSMENTS - Wound and Skin A ssessment / Reassessment X - Simple Wound Assessment / Reassessment - one wound 1 5 []  - 0 Complex Wound Assessment / Reassessment - multiple wounds []  - 0 Dermatologic / Skin Assessment (not related to wound area) ASSESSMENTS - Focused Assessment X- 1 5 Circumferential Edema Measurements - multi extremities []  - 0 Nutritional Assessment / Counseling / Intervention []  - 0 Lower Extremity Assessment (monofilament, tuning fork, pulses) []  - 0 Peripheral Arterial Disease Assessment (using hand held doppler) ASSESSMENTS - Ostomy and/or Continence Assessment and Care []  - 0 Incontinence Assessment and Management []  - 0 Ostomy Care Assessment and Management (repouching, etc.) PROCESS - Coordination of Care X - Simple Patient / Family Education for ongoing care 1 [address], Marnesha K ([id number]) [id number] Page 2 of 8 []  - 0 Complex (extensive) Patient / Family Education for ongoing care X- 1 10 Staff obtains Programmer, systems, Records, T Results / Process Orders est []  - 0 Staff telephones HHA, Nursing Homes / Clarify orders / etc []  - 0 Routine Transfer to another Facility (non-emergent condition) []  - 0 Routine Hospital Admission (non-emergent condition) []  - 0 New Admissions / Biomedical engineer / Ordering NPWT Apligraf, etc. , []  - 0 Emergency Hospital Admission (emergent condition) X- 1 10 Simple Discharge Coordination []  - 0 Complex (extensive) Discharge Coordination PROCESS - Special Needs []  - 0 Pediatric / Minor Patient Management []  - 0 Isolation Patient Management []  - 0 Hearing / Language / Visual special needs []  - 0 Assessment of Community assistance (transportation,  D/C planning, etc.) []  - 0 Additional assistance / Altered mentation []  - 0 Support Surface(s) Assessment (bed, cushion, seat, etc.) INTERVENTIONS - Wound Cleansing / Measurement X - Simple Wound Cleansing -  one wound 1 5 []  - 0 Complex Wound Cleansing - multiple wounds X- 1 5 Wound Imaging (photographs - any number of wounds) []  - 0 Wound Tracing (instead of photographs) []  - 0 Simple Wound Measurement - one wound []  - 0 Complex Wound Measurement - multiple wounds INTERVENTIONS - Wound Dressings X - Small Wound Dressing one or multiple wounds 1 10 []  - 0 Medium Wound Dressing one or multiple wounds []  - 0 Large Wound Dressing one or multiple wounds X- 1 5 Application of Medications - topical []  - 0 Application of Medications - injection INTERVENTIONS - Miscellaneous []  - 0 External ear exam []  - 0 Specimen Collection (cultures, biopsies, blood, body fluids, etc.) []  - 0 Specimen(s) / Culture(s) sent or taken to Lab for analysis []  - 0 Patient Transfer (multiple staff / Harrel Lemon Lift / Similar devices) []  - 0 Simple Staple / Suture removal (25 or less) []  - 0 Complex Staple / Suture removal (26 or more) []  - 0 Hypo / Hyperglycemic Management (close monitor of Blood Glucose) []  - 0 Ankle / Brachial Index (ABI) - do not check if billed separately X- 1 5 Vital Signs Has the patient been seen at the hospital within the last three years: Yes Total Score: 90 Level Of Care: New/Established - Level 3 Electronic Signature(s) Signed: 05/13/2022 11:12:19 AM By: Rhae Hammock RN Entered By: Rhae Hammock on 05/12/2022 10:26:22 Pirie, Achol K ([id number]) [id number] Page 3 of 8 -------------------------------------------------------------------------------- Encounter Discharge Information Details Patient Name: Date of Service: [place] [name] 05/12/2022 9:30 A M Medical Record Number: [id number] Patient Account Number:  [id number] Date of Birth/Sex: Treating RN: 1955-03-26 (67 y.o. [name], [name] Primary Care Rosalynn Sergent: [name] Other Clinician: Referring Zoe Goonan: Treating Kazzandra Desaulniers/Extender: [name] in Treatment: 49 Encounter Discharge Information Items Discharge Condition: Stable Ambulatory Status: Ambulatory Discharge Destination: Home Transportation: Private Auto Accompanied By: self Schedule Follow-up Appointment: Yes Clinical Summary of Care: Patient Declined Electronic Signature(s) Signed: 05/13/2022 11:12:19 AM By: Rhae Hammock RN Entered By: Rhae Hammock on 05/12/2022 10:26:56 -------------------------------------------------------------------------------- Lower Extremity Assessment Details Patient Name: Date of Service: [name], [name] 05/12/2022 9:30 A M Medical Record Number: [id number] Patient Account Number: [id number] Date of Birth/Sex: Treating RN: February 13, 1956 (67 y.o. F) Primary Care Brysun Eschmann: [name] Other Clinician: Referring Tyese Finken: Treating Onesimo Lingard/Extender: [name] in Treatment: 31 Edema Assessment Assessed: [Left: No] [Right: No] Edema: [Left: N] [Right: o] Calf Left: Right: Point of Measurement: From Medial Instep 45.5 cm Ankle Left: Right: Point of Measurement: From Medial Instep 25.3 cm Electronic Signature(s) Signed: 05/16/2022 4:16:21 PM By: Erenest Blank Entered By: Erenest Blank on 05/12/2022 09:23:38 -------------------------------------------------------------------------------- Multi Wound Chart Details Patient Name: Date of Service: [name], [name] 05/12/2022 9:30 A M Medical Record Number: [id number] Patient Account Number: [id number] Date of Birth/Sex: Treating RN: 1955-03-16 (67 y.o. F) Primary Care Alysen Smylie: [name] Other Clinician: Referring Guerin Lashomb: Treating Shabrea Weldin/Extender: [name] in Treatment: 31 Vital Signs Height(in): Capillary Blood Glucose(mg/dl): 153 Weight(lbs): Pulse(bpm): 73 Body Mass Index(BMI): Blood Pressure(mmHg): 137/78 Temperature(F): 97.7 Respiratory Rate(breaths/min): 18 Kochan, Laiylah K ([id number]) [id number] Page 4 of 8 [6:Photos:] [N/A:N/A] Right, Plantar Foot Right, Lateral Foot N/A Wound Location: Blister Gradually Appeared N/A Wounding Event: Diabetic Wound/Ulcer of the Lower Diabetic Wound/Ulcer of the Lower N/A Primary Etiology: Extremity Extremity Sleep Apnea, Coronary Artery Sleep Apnea, Coronary Artery N/A Comorbid History: Disease, Hypertension,  Type II Disease, Hypertension, Type II Diabetes, Neuropathy Diabetes, Neuropathy 04/14/2022 05/05/2022 N/A Date Acquired: 4 1 N/A Weeks of Treatment: Open Open N/A Wound Status: No No N/A Wound Recurrence: 0.1x0.1x0.1 0.1x0.1x0.1 N/A Measurements L x W x D (cm) 0.008 0.008 N/A A (cm) : rea 0.001 0.001 N/A Volume (cm) : 88.70% 93.70% N/A % Reduction in A rea: 95.20% 97.40% N/A % Reduction in Volume: Grade 2 Grade 2 N/A Classification: Medium Medium N/A Exudate A mount: Serosanguineous Serosanguineous N/A Exudate Type: red, brown red, brown N/A Exudate Color: Distinct, outline attached Distinct, outline attached N/A Wound Margin: Large (67-100%) Large (67-100%) N/A Granulation A mount: Red Red N/A Granulation Quality: N/A None Present (0%) N/A Necrotic A mount: Fat Layer (Subcutaneous Tissue): Yes Fat Layer (Subcutaneous Tissue): Yes N/A Exposed Structures: Fascia: No Fascia: No Tendon: No Tendon: No Muscle: No Muscle: No Joint: No Joint: No Bone: No Bone: No Large (67-100%) Small (1-33%) N/A Epithelialization: Callus: Yes Callus: Yes N/A Periwound Skin Texture: No Abnormalities Noted No Abnormalities Noted N/A Periwound Skin Moisture: No Abnormalities Noted No Abnormalities Noted N/A Periwound Skin  Color: No Abnormality No Abnormality N/A Temperature: Treatment Notes Electronic Signature(s) Signed: 05/12/2022 1:29:58 PM By: Kalman Shan DO Entered By: Kalman Shan on 05/12/2022 10:18:16 -------------------------------------------------------------------------------- Multi-Disciplinary Care Plan Details Patient Name: Date of Service: [name], [name] 05/12/2022 9:30 A M Medical Record Number: [id number] Patient Account Number: [id number] Date of Birth/Sex: Treating RN: [birth year] (67 y.o. [name], [name] Primary Care Joesphine Schemm: [name] Other Clinician: Referring Margene Cherian: Treating Sindee Stucker/Extender: [name] in Treatment: 31 Active Inactive Electronic Signature(s) Signed: 05/13/2022 11:12:19 AM By: Rhae Hammock RN Entered By: Rhae Hammock on 05/12/2022 10:25:46 Everson, Falon K ([id number]) [id number] Page 5 of 8 -------------------------------------------------------------------------------- Pain Assessment Details Patient Name: Date of Service: MA [name] 05/12/2022 9:30 A M Medical Record Number: [id number] Patient Account Number: [id number] Date of Birth/Sex: Treating RN: [birth year] (67 y.o. F) Primary Care Werner Labella: [name] Other Clinician: Referring Jep Dyas: Treating Jeanifer Halliday/Extender: [name] in Treatment: 31 Active Problems Location of Pain Severity and Description of Pain Patient Has Paino No Site Locations Pain Management and Medication Current Pain Management: Electronic Signature(s) Signed: 05/16/2022 4:16:21 PM By: Erenest Blank Entered By: Erenest Blank on 05/12/2022 09:20:43 -------------------------------------------------------------------------------- Patient/Caregiver Education Details Patient Name: Date of Service: [name], A DRIENNE Raliegh Ip 3/21/2024andnbsp9:30 [place] Record Number:  [id number] Patient Account Number: [id number] Date of Birth/Gender: Treating RN: 11-Jan-1956 (67 y.o. [name], [name] Primary Care Physician: [name] Other Clinician: Referring Physician: Treating Physician/Extender: [name] in Treatment: 82 Education Assessment Education Provided To: Patient Education Topics Provided Wound/Skin Impairment: Methods: Explain/Verbal Responses: Reinforcements needed, State content correctly Electronic Signature(s) Signed: 05/13/2022 11:12:19 AM By: Rhae Hammock RN Entered By: Rhae Hammock on 05/12/2022 09:29:36 Sundt, Izzy K ([id number]) [id number] Page 6 of 8 -------------------------------------------------------------------------------- Wound Assessment Details Patient Name: Date of Service: MA [name] 05/12/2022 9:30 A M Medical Record Number: [id number] Patient Account Number: [id number] Date of Birth/Sex: Treating RN: [birth year] (67 y.o. [name], [name] Primary Care Keats Kingry: [name] Other Clinician: Referring Shamarcus Hoheisel: Treating Sean Macwilliams/Extender: [name] in Treatment: 31 Wound Status Wound Number: 6 Primary Diabetic Wound/Ulcer of the Lower Extremity Etiology: Wound Location: Right, Plantar Foot Wound Healed - Epithelialized Wounding Event: Blister Status: Date Acquired: 04/14/2022 Comorbid Sleep Apnea, Coronary Artery Disease, Hypertension, Type  II Weeks Of Treatment: 4 History: Diabetes, Neuropathy Clustered Wound: No Photos Wound Measurements Length: (cm) Width: (cm) Depth: (cm) Area: (cm) Volume: (cm) 0 % Reduction in Area: 100% 0 % Reduction in Volume: 100% 0 Epithelialization: Large (67-100%) 0 Tunneling: No 0 Undermining: No Wound Description Classification: Grade 2 Wound Margin: Distinct, outline attached Exudate Amount: Medium Exudate Type:  Serosanguineous Exudate Color: red, brown Foul Odor After Cleansing: No Slough/Fibrino No Wound Bed Granulation Amount: Large (67-100%) Exposed Structure Granulation Quality: Red Fascia Exposed: No Fat Layer (Subcutaneous Tissue) Exposed: Yes Tendon Exposed: No Muscle Exposed: No Joint Exposed: No Bone Exposed: No Periwound Skin Texture Texture Color No Abnormalities Noted: No No Abnormalities Noted: Yes Callus: Yes Temperature / Pain Temperature: No Abnormality Moisture No Abnormalities Noted: Yes Treatment Notes Wound #6 (Foot) Wound Laterality: Plantar, Right Cleanser Peri-Wound Care Topical Primary Dressing Secondary Dressing ALIANNY, SILVERSMITH ([id number]) [id number] Page 7 of 8 Secured With Compression Wrap Compression Stockings Environmental education officer) Signed: 05/13/2022 11:12:19 AM By: Rhae Hammock RN Entered By: Rhae Hammock on 05/12/2022 10:24:58 -------------------------------------------------------------------------------- Wound Assessment Details Patient Name: Date of Service: [name], [name] 05/12/2022 9:30 A M Medical Record Number: [id number] Patient Account Number: [id number] Date of Birth/Sex: Treating RN: 12/10/1955 (67 y.o. [name], [name] Primary Care Mackinley Kiehn: [name] Other Clinician: Referring Cadance Raus: Treating Buford Gayler/Extender: [name] in Treatment: 31 Wound Status Wound Number: 7 Primary Diabetic Wound/Ulcer of the Lower Extremity Etiology: Wound Location: Right, Lateral Foot Wound Healed - Epithelialized Wounding Event: Gradually Appeared Status: Date Acquired: 05/05/2022 Comorbid Sleep Apnea, Coronary Artery Disease, Hypertension, Type II Weeks Of Treatment: 1 History: Diabetes, Neuropathy Clustered Wound: No Photos Wound Measurements Length: (cm) Width: (cm) Depth: (cm) Area: (cm) Volume: (cm) 0 % Reduction in Area:  100% 0 % Reduction in Volume: 100% 0 Epithelialization: Small (1-33%) 0 Tunneling: No 0 Undermining: No Wound Description Classification: Grade 2 Wound Margin: Distinct, outline attached Exudate Amount: Medium Exudate Type: Serosanguineous Exudate Color: red, brown Foul Odor After Cleansing: No Slough/Fibrino No Wound Bed Granulation Amount: Large (67-100%) Exposed Structure Granulation Quality: Red Fascia Exposed: No Necrotic Amount: None Present (0%) Fat Layer (Subcutaneous Tissue) Exposed: Yes Tendon Exposed: No Muscle Exposed: No Joint Exposed: No Bone Exposed: No Periwound Skin Texture Texture Color No Abnormalities Noted: No No Abnormalities Noted: Yes Callus: Yes Temperature / Pain Temperature: No Abnormality Moisture Maday, Orlene K ([id number]) [id number] Page 8 of 8 No Abnormalities Noted: Yes Treatment Notes Wound #7 (Foot) Wound Laterality: Right, Lateral Cleanser Peri-Wound Care Topical Primary Dressing Secondary Dressing Secured With Compression Wrap Compression Stockings Add-Ons Electronic Signature(s) Signed: 05/13/2022 11:12:19 AM By: Rhae Hammock RN Entered By: Rhae Hammock on 05/12/2022 10:24:58 -------------------------------------------------------------------------------- Vitals Details Patient Name: Date of Service: [name], [name] 05/12/2022 9:30 A M Medical Record Number: [id number] Patient Account Number: [id number] Date of Birth/Sex: Treating RN: [birth year] (66 y.o. F) Primary Care Ellouise Mcwhirter: [name] Other Clinician: Referring Avalene Sealy: Treating Jim Lundin/Extender: [name] in Treatment: 31 Vital Signs Time Taken: 09:17 Temperature (F): 97.7 Pulse (bpm): 73 Respiratory Rate (breaths/min): 18 Blood Pressure (mmHg): 137/78 Capillary Blood Glucose (mg/dl): 153 Reference Range: 80 - 120 mg / dl Electronic Signature(s) Signed: 05/16/2022  4:16:21 PM By: Erenest Blank Entered By: Erenest Blank on 05/12/2022 09:19:41

## 2022-05-17 ENCOUNTER — Encounter: Payer: Self-pay | Admitting: Internal Medicine

## 2022-05-17 DIAGNOSIS — Z0000 Encounter for general adult medical examination without abnormal findings: Secondary | ICD-10-CM

## 2022-05-18 ENCOUNTER — Other Ambulatory Visit: Payer: Self-pay | Admitting: Internal Medicine

## 2022-05-18 DIAGNOSIS — N644 Mastodynia: Secondary | ICD-10-CM

## 2022-05-26 ENCOUNTER — Ambulatory Visit (INDEPENDENT_AMBULATORY_CARE_PROVIDER_SITE_OTHER): Payer: Medicare Other | Admitting: Licensed Clinical Social Worker

## 2022-05-26 DIAGNOSIS — F331 Major depressive disorder, recurrent, moderate: Secondary | ICD-10-CM | POA: Diagnosis not present

## 2022-05-26 NOTE — Progress Notes (Signed)
  THERAPIST PROGRESS NOTE  Session Time: [place] in office visit for patient and LCSW clinician  Participation Level: Active  Behavioral Response: NAAlertDepressed  Type of Therapy: Individual Therapy  Treatment Goals addressed: Develop healthy thinking patterns and beliefs about self, others, and the world that lead to the alleviation and help prevent the relapse of depression per self report 3 out of 5 sessions documented    ProgressTowards Goals: Progressing  Interventions: Motivational Interviewing, Strength-based, Supportive, and Reframing  Summary: [name] is a 67 y.o. female who presents with continuing symptoms associated with depression diagnosis.  Patient reports that she has noticed an escalation in mood overall motivation and initiative.  Clinician completed PHQ-9, and patient had the score of 23.  Discussed current medication management of symptoms, and encouraged patient to reach out to her medication prescriber for possible medication or dosage change.  Patient reports that she will call prescriber today.  Allowed pt to explore and express thoughts and feelings associated with recent life situations and external stressors.  Patient identifies her current marital relationship as primary source of stressor at time of session.  Patient states that she feels her husband's feelings towards her fluctuate-patient states that she feels rejected by her husband sometimes, and feels loved by him and others.  Patient states that her mood and motivation has been low, and he will make derogatory statements about the cleanliness of their home.  Patient states that she is aware that she cannot engage in cleaning behaviors, but he does not help or even offered to help.  Patient states that she does worry about the overall health of her husband--patient states that he has an abdominal aneurysm, and patient fears that he will need surgery in the  future.  Discussed patient's strong faith, and how it gives her strength.  Patient states that she is involved in her church, church women's groups, and Bible study.  Continued recommendations are as follows: self care behaviors, positive social engagements, focusing on overall work/home/life balance, and focusing on positive physical and emotional wellness.   Suicidal/Homicidal: No  Therapist Response: Pt is continuing to apply interventions learned in session into daily life situations. Pt is currently on track to meet goals utilizing interventions mentioned above. Personal growth and progress noted. Treatment to continue as indicated.   Patient feels that she is trying harder overall to improve self-care and life management.  Developments continue in the areas of family and relational functioning.  Plan: Return again in 4 weeks.  Patient states that she will reach out to her medication management prescriber with information about the escalating depression symptoms.  Diagnosis:  Encounter Diagnosis  Name Primary?   MDD (major depressive disorder), recurrent episode, moderate Yes   Collaboration of Care: Other pt to continue follow ups with PCP Dr. Foy [facility] and Dr. Tivis Ringer at [facility] was advised Release of Information must be obtained prior to any record release in order to collaborate their care with an outside provider. Patient/Guardian was advised if they have not already done so to contact the registration department to sign all necessary forms in order for [place] to release information regarding their care.   Consent: Patient/Guardian gives verbal consent for treatment and assignment of benefits for services provided during this visit. Patient/Guardian expressed understanding and agreed to proceed.   [place], LCSW 05/26/2022

## 2022-05-31 DIAGNOSIS — B372 Candidiasis of skin and nail: Secondary | ICD-10-CM | POA: Diagnosis not present

## 2022-05-31 DIAGNOSIS — G4733 Obstructive sleep apnea (adult) (pediatric): Secondary | ICD-10-CM | POA: Diagnosis not present

## 2022-06-03 ENCOUNTER — Ambulatory Visit: Payer: Medicare Other

## 2022-06-03 ENCOUNTER — Ambulatory Visit
Admission: RE | Admit: 2022-06-03 | Discharge: 2022-06-03 | Disposition: A | Discharge status: Home / Self Care | Payer: Medicare Other | Source: Ambulatory Visit | Attending: Internal Medicine | Admitting: Internal Medicine

## 2022-06-03 DIAGNOSIS — N644 Mastodynia: Secondary | ICD-10-CM

## 2022-06-03 DIAGNOSIS — L304 Erythema intertrigo: Secondary | ICD-10-CM | POA: Diagnosis not present

## 2022-06-03 DIAGNOSIS — B372 Candidiasis of skin and nail: Secondary | ICD-10-CM | POA: Diagnosis not present

## 2022-06-07 DIAGNOSIS — E113492 Type 2 diabetes mellitus with severe nonproliferative diabetic retinopathy without macular edema, left eye: Secondary | ICD-10-CM | POA: Diagnosis not present

## 2022-06-07 DIAGNOSIS — E113411 Type 2 diabetes mellitus with severe nonproliferative diabetic retinopathy with macular edema, right eye: Secondary | ICD-10-CM | POA: Diagnosis not present

## 2022-06-08 ENCOUNTER — Ambulatory Visit (INDEPENDENT_AMBULATORY_CARE_PROVIDER_SITE_OTHER): Payer: Medicare Other | Admitting: Licensed Clinical Social Worker

## 2022-06-08 DIAGNOSIS — F331 Major depressive disorder, recurrent, moderate: Secondary | ICD-10-CM

## 2022-06-08 NOTE — Progress Notes (Unsigned)
  THERAPIST PROGRESS NOTE  Session Time: 8-9a  Behavioral Health Outpatient [facility] in office visit for patient and LCSW clinician  Participation Level: Active  Behavioral Response: NAAlertDepressed  Type of Therapy: Individual Therapy  Treatment Goals addressed: Develop healthy thinking patterns and beliefs about self, others, and the world that lead to the alleviation and help prevent the relapse of depression per self report 3 out of 5 sessions documented    ProgressTowards Goals: Progressing  Interventions: Motivational Interviewing, Strength-based, Supportive, and Reframing  Summary: [name] is a 67 y.o. female who presents with continuing symptoms associated with depression diagnosis.  Patient reports that she is compliant with her medication, and recently had a medication management appointment with her primary care physician.  Patient reports at that time they doubled the dosage of her Wellbutrin, and patient has felt the difference since the medication change.  Clinician assisted pt with identifying situations/scenarios/schemas triggering anxiety and/or depression symptoms. Allowed pt to explore and express thoughts and feelings and discussed current coping mechanisms. Reviewed changes/recommendations.  Discussed relationship with husband--allow patient to explore some pros and cons that have happened since last session.  Patient reports that she has been identifying activities that make her feel happy and bring her pleasure.  Some of those activities include cooking, planting in the garden, and finishing house projects.  Patient feels that she has had more energy to engage in some of these activities, which makes her feel better.  Assisted patient with identifying strengths: Strong family values, focus on her health and wellness, strong faith, and willingness to help others.  Continued recommendations are as follows: self care behaviors, positive social engagements,  focusing on overall work/home/life balance, and focusing on positive physical and emotional wellness.   Suicidal/Homicidal: No  Therapist Response: Pt is continuing to apply interventions learned in session into daily life situations. Pt is currently on track to meet goals utilizing interventions mentioned above. Personal growth and progress noted. Treatment to continue as indicated.   Patient feels that she is trying harder overall to improve self-care and life management.  Developments continue in the areas of family and relational functioning.  Plan: Return again in 4 weeks.  Patient states that she will reach out to her medication management prescriber with information about the escalating depression symptoms.  Diagnosis:  Encounter Diagnosis  Name Primary?   MDD (major depressive disorder), recurrent episode, moderate Yes   Collaboration of Care: Other pt to continue follow ups with PCP [facility] and Dr. Edison Pace at [facility]  Patient/Guardian was advised Release of Information must be obtained prior to any record release in order to collaborate their care with an outside provider. Patient/Guardian was advised if they have not already done so to contact the registration department to sign all necessary forms in order for [name] to release information regarding their care.   Consent: Patient/Guardian gives verbal consent for treatment and assignment of benefits for services provided during this visit. Patient/Guardian expressed understanding and agreed to proceed  Portions of this report may have been transcribed using voice recognition software. Every effort was made to ensure accuracy; however, inadvertent computerized transcription errors may be present .   Ernest Haber Fianna Snowball, LCSW 06/08/2022

## 2022-06-21 DIAGNOSIS — E113492 Type 2 diabetes mellitus with severe nonproliferative diabetic retinopathy without macular edema, left eye: Secondary | ICD-10-CM | POA: Diagnosis not present

## 2022-06-21 DIAGNOSIS — E113411 Type 2 diabetes mellitus with severe nonproliferative diabetic retinopathy with macular edema, right eye: Secondary | ICD-10-CM | POA: Diagnosis not present

## 2022-06-30 DIAGNOSIS — F430 Acute stress reaction: Secondary | ICD-10-CM | POA: Diagnosis not present

## 2022-06-30 DIAGNOSIS — F329 Major depressive disorder, single episode, unspecified: Secondary | ICD-10-CM | POA: Diagnosis not present

## 2022-07-06 DIAGNOSIS — Z1272 Encounter for screening for malignant neoplasm of vagina: Secondary | ICD-10-CM | POA: Diagnosis not present

## 2022-07-06 DIAGNOSIS — E1129 Type 2 diabetes mellitus with other diabetic kidney complication: Secondary | ICD-10-CM | POA: Diagnosis not present

## 2022-07-06 DIAGNOSIS — Z4681 Encounter for fitting and adjustment of insulin pump: Secondary | ICD-10-CM | POA: Diagnosis not present

## 2022-07-06 DIAGNOSIS — Z124 Encounter for screening for malignant neoplasm of cervix: Secondary | ICD-10-CM | POA: Diagnosis not present

## 2022-07-06 DIAGNOSIS — Z6841 Body Mass Index (BMI) 40.0 and over, adult: Secondary | ICD-10-CM | POA: Diagnosis not present

## 2022-07-06 DIAGNOSIS — N952 Postmenopausal atrophic vaginitis: Secondary | ICD-10-CM | POA: Diagnosis not present

## 2022-07-06 DIAGNOSIS — E785 Hyperlipidemia, unspecified: Secondary | ICD-10-CM | POA: Diagnosis not present

## 2022-07-06 DIAGNOSIS — I10 Essential (primary) hypertension: Secondary | ICD-10-CM | POA: Diagnosis not present

## 2022-07-06 DIAGNOSIS — Z794 Long term (current) use of insulin: Secondary | ICD-10-CM | POA: Diagnosis not present

## 2022-07-07 ENCOUNTER — Ambulatory Visit (INDEPENDENT_AMBULATORY_CARE_PROVIDER_SITE_OTHER): Payer: Medicare Other | Admitting: Licensed Clinical Social Worker

## 2022-07-07 ENCOUNTER — Encounter: Payer: Self-pay | Admitting: Neurology

## 2022-07-07 ENCOUNTER — Ambulatory Visit (INDEPENDENT_AMBULATORY_CARE_PROVIDER_SITE_OTHER): Payer: Medicare Other | Admitting: Neurology

## 2022-07-07 VITALS — BP 123/59 | HR 80 | Ht 67.0 in | Wt 274.0 lb

## 2022-07-07 DIAGNOSIS — G4733 Obstructive sleep apnea (adult) (pediatric): Secondary | ICD-10-CM

## 2022-07-07 DIAGNOSIS — F331 Major depressive disorder, recurrent, moderate: Secondary | ICD-10-CM

## 2022-07-07 NOTE — Patient Instructions (Signed)
It was nice to meet you today.  As discussed, we will proceed with a home sleep test (HST) to re-establish your sleep apnea diagnosis and to get you a new machine. Our sleep lab staff will reach out to you to arrange for pickup and for tutorial of your test equipment - you will do the test at home that night and bring the test sensors back for data analysis the next day or whenever you are scheduled for drop off of your test equipment. I will write for a new machine after your HST confirms your obstructive sleep apnea diagnosis.   Please remember, you will not use your CPAP the night of your testing.  This is so we get diagnostic data, we do not need treatment data.    Most people who are very consistent with their AutoPap or CPAP use do not sleep very well without the machine - this is understandable, but as long as we have just sleep data that confirms your sleep apnea diagnosis we should be good.   We will schedule a follow-up appointment after set up with your new machine, typically within 31 to 89 days post treatment start. You will need to show compliance with usage and fulfill a minimum usage percentage (this is an insurance requirement).  After you have done your home sleep test, you can resume using your current machine until you get a new one.   Please continue using your CPAP regularly. While your insurance requires that you use CPAP at least 4 hours each night on 70% of the nights, I recommend, that you not skip any nights and use it throughout the night if you can. Getting used to CPAP and staying with the treatment long term does take time and patience and discipline. Untreated obstructive sleep apnea when it is moderate to severe can have an adverse impact on cardiovascular health and raise her risk for heart disease, arrhythmias, hypertension, congestive heart failure, stroke and diabetes. Untreated obstructive sleep apnea causes sleep disruption, nonrestorative sleep, and sleep  deprivation. This can have an impact on your day to day functioning and cause daytime sleepiness and impairment of cognitive function, memory loss, mood disturbance, and problems focussing. Using CPAP regularly can improve these symptoms.   

## 2022-07-07 NOTE — Progress Notes (Signed)
Subjective:    Patient ID: [name] is a 67 y.o. female.  HPI    [name], MD, PhD [facility] [address], [address] [address] [place], [place] [address]  Dear [name],  I saw your patient, [name], upon your kind request in my sleep clinic today for initial consultation of her sleep disorder, in particular, evaluation of her prior diagnosis of obstructive sleep apnea.  The patient is unaccompanied today.  As you know, [name] is a 67 year old female with an underlying medical history of diabetes, neuropathy, depression, anxiety, coronary artery disease, fatty liver, osteoarthritis with status post partial knee arthroplasty bilaterally, status post neck surgery, status post toe amputations on the right, smoking, hypertension, hypothyroidism, and severe obesity with a BMI of over 40, who was previously diagnosed with obstructive sleep apnea and placed on PAP therapy.  Her Epworth sleepiness score is 8 out of 24, fatigue severity score is 50 out of 63.  Of note, she is on several medications including potentially sedating medications.  She has a prescription for Xanax, she is on Cymbalta, she is on gabapentin. I reviewed your office note from 05/31/2022.  Prior sleep study results are not available for my review today.  Her original sleep study was approximately 18 years ago, she is on her second machine since 2016 which she has been using consistently.  She continues to benefit from treatment but needs her new machine.  She has been getting her supplies in the mail.  She does not currently have a sleep specialist.  She goes to bed somewhere between 8 PM and midnight and rise time is usually around 6 AM.  Not have a TV in her bedroom.  She brought a support dog for her appointment today but was agreeable to keeping the dog in the nurses pod for the appointment.  She lives with her husband and 2 dogs.  They have no children.  She has had a CPAP machine  since 2016.  I was able to review compliance download for the past 30 days, she has been using her CPAP which is a ResMed air sense 10 AutoSet machine 100% with an average usage of 7 hours and 54 minutes, residual AHI at goal at 0.6/h, pressure at 13 cm without EPR, leak acceptable with the 95th percentile at 9.8 L/min.  She is a retired Buyer, retail.  She drinks quite a bit of caffeine in the form of coffee, about 6 cups/day, throughout the day, as late as evening.  She does not currently drink any alcohol.  She smokes 1 pack/day and is currently not working on smoking cessation.  She uses nasal pillows with good success with her CPAP machine.  As she recalls, she was diagnosed with severe sleep apnea, events were approximately 44/h.  She denies night to night nocturia and does not have nocturnal or morning headaches.  Her Past Medical History Is Significant For: Past Medical History:  Diagnosis Date   Anxiety    Arthritis    knees   Coronary artery disease    Depression    Fatty liver    History of nuclear stress test 12/16/2011   exercise myoview; normal images with 2-92mm ST-segment depression - subsequent cath revelaed subtotally occluded small 2nd marginal branch & 75% PDA lesion, normal LV function   Hypertension    Hypothyroidism    Neuropathy    in feet   OSA on CPAP    AHI = 44 (per patient)  setting 13 per pt   Personal history of kidney stones    Primary localized osteoarthritis of left knee 04/24/2015   Primary localized osteoarthritis of right knee 07/02/2015   Tobacco abuse    Type 2 diabetes mellitus (HCC)    insulin pump    Her Past Surgical History Is Significant For: Past Surgical History:  Procedure Laterality Date   3rd right toe amputation  02/2016   AMPUTATION Right 12/22/2014   Procedure: RIGHT 2ND TOE AMPUTATION;  Surgeon: Toni Arthurs, MD;  Location: [facility] OR;  Service: Orthopedics;  Laterality: Right;   AUGMENTATION MAMMAPLASTY Bilateral    BREAST  ENHANCEMENT SURGERY Bilateral    CARDIAC CATHETERIZATION  01/04/2012   subtotally occluded small 2nd marginal branch & 75% PDA lesion, normal LV function   CATARACT EXTRACTION Bilateral    with lens implants   CHOLECYSTECTOMY  2008   COLONOSCOPY     COLONOSCOPY WITH PROPOFOL N/A 05/12/2016   Procedure: COLONOSCOPY WITH PROPOFOL;  Surgeon: Charna Elizabeth, MD;  Location: [facility] ENDOSCOPY;  Service: Endoscopy;  Laterality: N/A;   I & D KNEE WITH POLY EXCHANGE Left 05/13/2015   Procedure: IRRIGATION AND DEBRIDEMENT KNEE WITH POLY EXCHANGE;  Surgeon: Teryl Lucy, MD;  Location: [facility] OR;  Service: Orthopedics;  Laterality: Left;   LEFT HEART CATHETERIZATION WITH CORONARY ANGIOGRAM N/A 01/04/2012   Procedure: LEFT HEART CATHETERIZATION WITH CORONARY ANGIOGRAM;  Surgeon: Runell Gess, MD;  Location[facility] CATH LAB;  Service: Cardiovascular;  Laterality: N/A;   PARTIAL KNEE ARTHROPLASTY Left 04/24/2015   Procedure: LEFT UNICOMPARTMENTAL KNEE ARTHROPLASTY ;  Surgeon: Teryl Lucy, MD;  Location: [facility];  Service: Orthopedics;  Laterality: Left;   PARTIAL KNEE ARTHROPLASTY Right 07/02/2015   Procedure: RIGHT UNI KNEE ARTHROPLASTY;  Surgeon: Teryl Lucy, MD;  Location:  SURGERY CENTER;  Service: Orthopedics;  Laterality: Right;  ANESTHESIA:  GENERAL, PRE/POST OP FEMORAL NERVE   POSTERIOR CERVICAL FUSION/FORAMINOTOMY  1990   ROBOTIC ASSISTED TOTAL HYSTERECTOMY WITH BILATERAL SALPINGO OOPHERECTOMY Bilateral 02/17/2015   Procedure: ROBOTIC ASSISTED TOTAL HYSTERECTOMY WITH BILATERAL SALPINGO OOPHORECTOMY;  Surgeon: Adolphus Birchwood, MD;  Location: [facility] ORS;  Service: Gynecology;  Laterality: Bilateral;    Her Family History Is Significant For: Family History  Problem Relation Age of Onset   Stroke Mother    Hypertension Mother    Diabetes Mother    Alzheimer's disease Mother    Diabetes Father    COPD Father        vent-dependent, MODS   Hypertension Sister    Mental illness Sister         borderline personality d/o   Mental illness Sister        schizoeffective d/o   Diabetes Sister    Sleep apnea Neg Hx     Her Social History Is Significant For: Social History   Socioeconomic History   Marital status: Married    Spouse name: Not on file   Number of children: Not on file   Years of education: 14   Highest education level: Not on file  Occupational History   Occupation: respiratory therapist    Employer: OTHER    Comment: [employer]  Tobacco Use   Smoking status: Every Day    Packs/day: 1.00    Years: 25.00    Additional pack years: 0.00    Total pack years: 25.00    Types: Cigarettes   Smokeless tobacco: Never   Tobacco comments:    quit April 2021  Vaping Use  Vaping Use: Never used  Substance and Sexual Activity   Alcohol use: No    Alcohol/week: 0.0 standard drinks of alcohol   Drug use: No   Sexual activity: Not on file  Other Topics Concern   Not on file  Social History Narrative   Not on file   Social Determinants of Health   Financial Resource Strain: Not on file  Food Insecurity: Not on file  Transportation Needs: Not on file  Physical Activity: Not on file  Stress: Not on file  Social Connections: Not on file    Her Allergies Are:  Allergies  Allergen Reactions   Penicillins Hives    Has patient had a PCN reaction causing immediate rash, facial/tongue/throat swelling, SOB or lightheadedness with hypotension: Unknown Has patient had a PCN reaction causing severe rash involving mucus membranes or skin necrosis: Unknown Has patient had a PCN reaction that required hospitalization: Unknown Has patient had a PCN reaction occurring within the last 10 years: No If all of the above answers are "NO", then may proceed with Cephalosporin use.  :   Her Current Medications Are:  Outpatient Encounter Medications as of 07/07/2022  Medication Sig   ACCU-CHEK AVIVA PLUS test strip    ALPRAZolam (XANAX) 0.5 MG tablet Take 0.5  mg by mouth at bedtime as needed for anxiety.    atorvastatin (LIPITOR) 20 MG tablet Take 20 mg by mouth daily.   atorvastatin (LIPITOR) 20 MG tablet Take 1 tablet by mouth daily.   buPROPion (WELLBUTRIN SR) 100 MG 12 hr tablet Take 100 mg by mouth every 12 (twelve) hours.   buPROPion (WELLBUTRIN XL) 150 MG 24 hr tablet TAKE 1 TABLET BY MOUTH EVERY DAY IN THE MORNING   Cholecalciferol (VITAMIN D PO) Take by mouth.   clotrimazole-betamethasone (LOTRISONE) cream Apply 1 application topically 2 (two) times daily.   Continuous Blood Gluc Receiver (FREESTYLE LIBRE 14 DAY READER) DEVI Use to monitor blood glucose   Continuous Blood Gluc Sensor (FREESTYLE LIBRE 14 DAY SENSOR) MISC Use to monitor blood glucose continuously-Dx code E11.29   doxycycline (VIBRA-TABS) 100 MG tablet Take 1 tablet (100 mg total) by mouth 2 (two) times daily.   DULoxetine (CYMBALTA) 60 MG capsule Take 60 mg by mouth daily.   escitalopram (LEXAPRO) 20 MG tablet Take 20 mg by mouth daily.   gabapentin (NEURONTIN) 300 MG capsule Take 600 mg by mouth 3 (three) times daily.    gabapentin (NEURONTIN) 300 MG capsule Take 2 capsules by mouth 3 (three) times daily.   gentamicin ointment (GARAMYCIN) 0.1 % Apply topically 3 (three) times daily.   Insulin Human (INSULIN PUMP) SOLN Inject 1 each into the skin continuous. Humalog - basal rate 3.2 Units/hr (Patient taking differently: Inject 1 each into the skin continuous. Novolog - basal rate 3.2 units/hr)   levothyroxine (SYNTHROID, LEVOTHROID) 137 MCG tablet    losartan-hydrochlorothiazide (HYZAAR) 100-12.5 MG tablet Take 1 tablet by mouth daily.   NOVOLOG 100 UNIT/ML injection UTD INJ UP TO A MAX OF 122 UNITS [place] PER DAY VIA INSULIN PUMP   OVER THE COUNTER MEDICATION Pt uses CPAP machine daily.   alum & mag hydroxide-simeth (MAALOX/MYLANTA) 200-200-20 MG/5ML suspension Take 15 mLs by mouth every 4 (four) hours as needed for indigestion or heartburn.   levothyroxine (SYNTHROID,  LEVOTHROID) 125 MCG tablet Take 125 mcg by mouth daily.   ondansetron (ZOFRAN) 4 MG tablet Take 1 tablet (4 mg total) by mouth every 6 (six) hours as needed for nausea.  oxyCODONE-acetaminophen (PERCOCET) 5-325 MG tablet Take 1 tablet by mouth every 6 (six) hours as needed for severe pain.   PAXLOVID, 300/100, 20 x 150 MG & 10 x 100MG  TBPK Take by mouth.   Probiotic CAPS Take 1 capsule by mouth daily.   silver sulfADIAZINE (SILVADENE) 1 % cream Apply 1 application topically daily.   No facility-administered encounter medications on file as of 07/07/2022.  :   Review of Systems:  Out of a complete 14 point review of systems, all are reviewed and negative with the exception of these symptoms as listed below:   Review of Systems  Neurological:        Pt here for sleep consult Pt states last sleep study  18 years ago Pt states CPAP machine 5 years  Pt states wears CPAP machine nightly      ESS:8 FSS:50    Objective:  Neurological Exam  Physical Exam Physical Examination:   Vitals:   07/07/22 1344  BP: (!) 123/59  Pulse: 80    General Examination: The patient is a very pleasant 67 y.o. female in no acute distress. She appears well-developed and well groomed.   HEENT: Normocephalic, atraumatic, pupils are equal, round and reactive to light, extraocular tracking is good without limitation to gaze excursion or nystagmus noted. Hearing is grossly intact. Face is symmetric with normal facial animation. Speech is clear with no dysarthria noted. There is no hypophonia. There is no lip, neck/head, jaw or voice tremor. Neck is supple with full range of passive and active motion. There are no carotid bruits on auscultation. Oropharynx exam reveals: moderate mouth dryness, adequate dental hygiene and marked airway crowding, due to Mallampati class IV, thicker tongue noted, small airway and thicker soft palate, tonsils and tip of uvula not fully visualized.  Neck circumference 17 inches.   Tongue protrudes centrally and palate elevates symmetrically.  Chest: Clear to auscultation without wheezing, rhonchi or crackles noted.  Heart: S1+S2+0, regular and normal without murmurs, rubs or gallops noted.   Abdomen: Soft, non-tender and non-distended.  Extremities: There is no obvious edema in the distal lower extremities bilaterally.   Skin: Warm and dry without trophic changes noted.   Musculoskeletal: exam reveals no obvious joint deformities.   Neurologically:  Mental status: The patient is awake, alert and oriented in all 4 spheres. Her immediate and remote memory, attention, language skills and fund of knowledge are appropriate. There is no evidence of aphasia, agnosia, apraxia or anomia. Speech is clear with normal prosody and enunciation. Thought process is linear. Mood is normal and affect is normal.  Cranial nerves II - XII are as described above under HEENT exam.  Motor exam: Normal bulk, strength and tone is noted. There is no obvious action or resting tremor.  Fine motor skills and coordination: grossly intact.  Cerebellar testing: No dysmetria or intention tremor. There is no truncal or gait ataxia.  Sensory exam: intact to light touch in the upper and lower extremities.  Gait, station and balance: She stands easily. No veering to one side is noted. No leaning to one side is noted. Posture is age-appropriate and stance is narrow based. Gait shows normal stride length and normal pace. No problems turning are noted.   Assessment and Plan:  In summary, TUNISIA BEERMAN is a very pleasant 67 y.o.-year old female with an underlying medical history of diabetes, neuropathy, depression, anxiety, coronary artery disease, fatty liver, osteoarthritis with status post partial knee arthroplasty bilaterally, status post neck surgery, status  post toe amputations on the right, smoking, hypertension, hypothyroidism, and severe obesity with a BMI of over 40, who presents for evaluation  of her sleep apnea of several years duration, she has been on PAP therapy for many years, she is on her second machine but it does need replacement.  She is compliant with treatment and commended for it.  She has had benefit over time and is advised to continue to use her current machine, we will proceed with a home sleep test for reevaluation of her sleep apnea which per her report was in the severe range.  She has had some weight gain since her original diagnosis that she recalls.  We talked a little bit about alternative treatment options, she is advised to continue to work on weight loss and strongly advised to work on smoking cessation.  She is advised to follow-up with your office to discuss smoking cessation.  She is agreeable to pursuing a home sleep test at this time.  I will write for a new machine after testing confirms her diagnosis.  She would be willing to establish with a local DME provider as well.   We will plan a follow-up after testing.  I answered all her questions today and she was in agreement.  Thank you very much for allowing me to participate in the care of this nice patient. If I can be of any further assistance to you please do not hesitate to call me at [phone].  Sincerely,   [name], MD, PhD

## 2022-07-07 NOTE — Progress Notes (Signed)
  THERAPIST PROGRESS NOTE  Session Time: 915-10a  Behavioral Health Outpatient [facility] in office visit for patient and LCSW clinician  Participation Level: Active  Behavioral Response: NAAlertDepressed  Type of Therapy: Individual Therapy  Treatment Goals addressed: Develop healthy thinking patterns and beliefs about self, others, and the world that lead to the alleviation and help prevent the relapse of depression per self report 3 out of 5 sessions documented    ProgressTowards Goals: Progressing  Interventions: Motivational Interviewing, Strength-based, Supportive, and Reframing  Summary: [name] is a 67 y.o. female who presents with continuing symptoms associated with depression diagnosis.  Patient reports that she is compliant with her medication.  Assisted pt with identifying anxiety triggers. Discussed importance of pushing through the avoidance response and to use coping skills to manage any feelings of minor distress. Allowed pt to explore and identify any limits or boundaries that could be helpful with managing stress/anxiety triggers. Reviewed panic management strategies w/ pt and provided pt with psychoeducational materials.   Continued recommendations are as follows: self care behaviors, positive social engagements, focusing on overall work/home/life balance, and focusing on positive physical and emotional wellness.   Suicidal/Homicidal: No  Therapist Response: Pt is continuing to apply interventions learned in session into daily life situations. Pt is currently on track to meet goals utilizing interventions mentioned above. Personal growth and progress noted. Treatment to continue as indicated.   Patient feels that she is trying harder overall to improve self-care and life management.  Developments continue in the areas of family and relational functioning.  Plan: Return again in 4 weeks.  Patient states that she will reach out to her medication management  prescriber with information about the escalating depression symptoms.  Diagnosis:  Encounter Diagnosis  Name Primary?   MDD (major depressive disorder), recurrent episode, moderate (HCC) Yes    Collaboration of Care: Other pt to continue follow ups with PCP [facility]. [facility] and Dr. Edison Pace at [facility]  Patient/Guardian was advised Release of Information must be obtained prior to any record release in order to collaborate their care with an outside provider. Patient/Guardian was advised if they have not already done so to contact the registration department to sign all necessary forms in order for [place] to release information regarding their care.   Consent: Patient/Guardian gives verbal consent for treatment and assignment of benefits for services provided during this visit. Patient/Guardian expressed understanding and agreed to proceed  Portions of this report may have been transcribed using voice recognition software. Every effort was made to ensure accuracy; however, inadvertent computerized transcription errors may be present .   Ernest Haber Charlisha Market, LCSW 07/07/2022

## 2022-07-11 DIAGNOSIS — E1129 Type 2 diabetes mellitus with other diabetic kidney complication: Secondary | ICD-10-CM | POA: Diagnosis not present

## 2022-07-11 DIAGNOSIS — Z720 Tobacco use: Secondary | ICD-10-CM | POA: Diagnosis not present

## 2022-07-11 DIAGNOSIS — Z794 Long term (current) use of insulin: Secondary | ICD-10-CM | POA: Diagnosis not present

## 2022-07-11 DIAGNOSIS — I10 Essential (primary) hypertension: Secondary | ICD-10-CM | POA: Diagnosis not present

## 2022-07-13 ENCOUNTER — Telehealth: Payer: Self-pay | Admitting: Neurology

## 2022-07-13 NOTE — Telephone Encounter (Signed)
HST- Medicare/[employer] no auth req'd-   Patient is scheduled at [facility] For 07/27/22 at 10:30 AM.  Emailed patient with appointment information.

## 2022-07-21 DIAGNOSIS — H43823 Vitreomacular adhesion, bilateral: Secondary | ICD-10-CM | POA: Diagnosis not present

## 2022-07-21 DIAGNOSIS — G4733 Obstructive sleep apnea (adult) (pediatric): Secondary | ICD-10-CM | POA: Diagnosis not present

## 2022-07-21 DIAGNOSIS — E113411 Type 2 diabetes mellitus with severe nonproliferative diabetic retinopathy with macular edema, right eye: Secondary | ICD-10-CM | POA: Diagnosis not present

## 2022-07-21 DIAGNOSIS — E113492 Type 2 diabetes mellitus with severe nonproliferative diabetic retinopathy without macular edema, left eye: Secondary | ICD-10-CM | POA: Diagnosis not present

## 2022-07-27 ENCOUNTER — Ambulatory Visit (INDEPENDENT_AMBULATORY_CARE_PROVIDER_SITE_OTHER): Payer: Medicare Other | Admitting: Neurology

## 2022-07-27 DIAGNOSIS — G4731 Primary central sleep apnea: Secondary | ICD-10-CM

## 2022-07-27 DIAGNOSIS — G4733 Obstructive sleep apnea (adult) (pediatric): Secondary | ICD-10-CM | POA: Diagnosis not present

## 2022-07-28 NOTE — Progress Notes (Signed)
See procedure note.

## 2022-07-29 NOTE — Procedures (Signed)
[facility]  HOME SLEEP TEST (Watch PAT) REPORT  STUDY DATE: 07/27/2022  DOB: April 15, 1955  MRN: [id number]  ORDERING CLINICIAN: [name], MD, PhD   REFERRING CLINICIAN: Geoffry Paradise, MD   CLINICAL INFORMATION/HISTORY: 67 year old female with an underlying medical history of diabetes, neuropathy, depression, anxiety, coronary artery disease, fatty liver, osteoarthritis with status post partial knee arthroplasty bilaterally, status post neck surgery, status post toe amputations on the right, smoking, hypertension, hypothyroidism, and severe obesity with a BMI of over 40, who was previously diagnosed with obstructive sleep apnea and placed on PAP therapy.  She has an older CPAP machine and qualifies for new equipment, current settings of 13 cm without EPR, she is compliant with treatment and has good apnea control and good benefit.  Prior sleep testing was years ago.  Epworth sleepiness score: 8/24.  BMI: 42.9 kg/m  FINDINGS:   Sleep Summary:   Total Recording Time (hours, min): 8 hours, 11 min  Total Sleep Time (hours, min):  6 hours, 55 min  Percent REM (%):    13.4%   Respiratory Indices:   Calculated pAHI (per hour):  44.2/hour         REM pAHI:    14.3/hour       NREM pAHI: 48.8/hour  Central pAHI: 10.7/hour  Oxygen Saturation Statistics:    Oxygen Saturation (%) Mean: 93%   Minimum oxygen saturation (%):                 80%   O2 Saturation Range (%): 80-98%    O2 Saturation (minutes) <=88%: 7.7 min  Pulse Rate Statistics:   Pulse Mean (bpm):    84/min    Pulse Range (61-99/min)   IMPRESSION: OSA (obstructive sleep apnea), severe Central Sleep Apnea   RECOMMENDATION:  This home sleep test demonstrates severe obstructive sleep apnea with a total AHI of 44.2/hour and O2 nadir of 80%.  There was a mild central apnea component as well.  AHI was 36.1/h utilizing the 4% desaturation criteria for hypopneas per Medicare guidelines.   Snoring was noted fairly consistently throughout the night and ranged from mild to loud.  Ongoing treatment with positive airway pressure is highly recommended. The patient has been compliant with her current CPAP of 13 cm without EPR.  She has good apnea control on the current settings and with the current interface.  She should qualify for a new machine.  I will write for a new set of equipment and supplies. A laboratory attended titration study can be considered in the future for optimization of treatment settings and to improve tolerance and compliance. Alternative treatment options are limited secondary to the severity of the patient's sleep disordered breathing, but may include surgical treatment with an implantable hypoglossal nerve stimulator (in carefully selected candidates, meeting criteria).  Concomitant weight loss is recommended, where clinically appropriate. Please note, that untreated obstructive sleep apnea may carry additional perioperative morbidity. Patients with significant obstructive sleep apnea should receive perioperative PAP therapy and the surgeons and particularly the anesthesiologist should be informed of the diagnosis and the severity of the sleep disordered breathing. The patient should be cautioned not to drive, work at heights, or operate dangerous or heavy equipment when tired or sleepy. Review and reiteration of good sleep hygiene measures should be pursued with any patient. Other causes of the patient's symptoms, including circadian rhythm disturbances, an underlying mood disorder, medication effect and/or an underlying medical problem cannot be ruled out based on this test. Clinical correlation  is recommended.  The patient and her referring provider will be notified of the test results. The patient will be seen in follow up in sleep clinic at [facility].  I certify that I have reviewed the raw data recording prior to the issuance of this report in accordance with the standards of  the American Academy of Sleep Medicine (AASM).  INTERPRETING PHYSICIAN:   [name], MD, PhD Medical Director, [facility]) Diplomat, ABPN (Neurology and Sleep)   [facility] [address] [place], [place] [address] [phone]

## 2022-07-29 NOTE — Addendum Note (Signed)
Addended by: [name] on: 07/29/2022 11:04 AM   Modules accepted: Orders

## 2022-08-03 ENCOUNTER — Ambulatory Visit (INDEPENDENT_AMBULATORY_CARE_PROVIDER_SITE_OTHER): Payer: Medicare Other | Admitting: Licensed Clinical Social Worker

## 2022-08-03 ENCOUNTER — Ambulatory Visit (INDEPENDENT_AMBULATORY_CARE_PROVIDER_SITE_OTHER): Payer: Medicare Other | Admitting: Podiatry

## 2022-08-03 DIAGNOSIS — E0843 Diabetes mellitus due to underlying condition with diabetic autonomic (poly)neuropathy: Secondary | ICD-10-CM | POA: Diagnosis not present

## 2022-08-03 DIAGNOSIS — Z89421 Acquired absence of other right toe(s): Secondary | ICD-10-CM | POA: Diagnosis not present

## 2022-08-03 DIAGNOSIS — B351 Tinea unguium: Secondary | ICD-10-CM | POA: Diagnosis not present

## 2022-08-03 DIAGNOSIS — M79676 Pain in unspecified toe(s): Secondary | ICD-10-CM | POA: Diagnosis not present

## 2022-08-03 DIAGNOSIS — F331 Major depressive disorder, recurrent, moderate: Secondary | ICD-10-CM | POA: Diagnosis not present

## 2022-08-03 DIAGNOSIS — F418 Other specified anxiety disorders: Secondary | ICD-10-CM

## 2022-08-03 DIAGNOSIS — E119 Type 2 diabetes mellitus without complications: Secondary | ICD-10-CM

## 2022-08-03 NOTE — Progress Notes (Signed)
LMVM tcb 6/12 sy

## 2022-08-03 NOTE — Progress Notes (Signed)
Chief Complaint  Patient presents with   Diabetes    Patient came in today for diabetic foot care, A1c- BG- 96, nail and callus trim     SUBJECTIVE Patient presents to office today complaining of elongated, thickened nails that cause pain while ambulating in shoes.  Patient is unable to trim their own nails.  Patient has also developed a symptomatic painful callus to the distal tip of the left third toe.  She would like to have it evaluated today.  Patient currently has a total contact cast applied to the right lower extremity from wound care.  Left intact today  Past Medical History:  Diagnosis Date   Anxiety    Arthritis    knees   Coronary artery disease    Depression    Fatty liver    History of nuclear stress test 12/16/2011   exercise myoview; normal images with 2-34mm ST-segment depression - subsequent cath revelaed subtotally occluded small 2nd marginal branch & 75% PDA lesion, normal LV function   Hypertension    Hypothyroidism    Neuropathy    in feet   OSA on CPAP    AHI = 44 (per patient) setting 13 per pt   Personal history of kidney stones    Primary localized osteoarthritis of left knee 04/24/2015   Primary localized osteoarthritis of right knee 07/02/2015   Tobacco abuse    Type 2 diabetes mellitus (HCC)    insulin pump    Allergies  Allergen Reactions   Penicillins Hives    Has patient had a PCN reaction causing immediate rash, facial/tongue/throat swelling, SOB or lightheadedness with hypotension: Unknown Has patient had a PCN reaction causing severe rash involving mucus membranes or skin necrosis: Unknown Has patient had a PCN reaction that required hospitalization: Unknown Has patient had a PCN reaction occurring within the last 10 years: No If all of the above answers are "NO", then may proceed with Cephalosporin use.     OBJECTIVE General Patient is awake, alert, and oriented x 3 and in no acute distress. Derm Skin is dry and supple. Negative open  lesions or macerations. Remaining integument unremarkable. Nails are tender, long, thickened and dystrophic with subungual debris, consistent with onychomycosis, 1-5 left. No signs of infection noted.  Hyperkeratotic preulcerative callus noted to the distal tip of the digit left foot Vasc  DP and PT pedal pulses palpable left. Temperature gradient within normal limits.  Neuro Epicritic and protective threshold sensation grossly intact left Musculoskeletal Exam total contact cast intact right lower extremity  ASSESSMENT 1.  Pain due to onychomycosis of toenails left 2.  Symptomatic preulcerative callus left third toe  PLAN OF CARE 1. Patient evaluated today.  Comprehensive diabetic foot exam performed today 2. Instructed to maintain good pedal hygiene and foot care.  3. Mechanical debridement of nails 1-5 left performed using a nail nipper. Filed with dremel without incident.  4.  Excisional debridement of the hyperkeratotic preulcerative callus lesion was performed today using a tissue nipper without incident or bleeding.  As a courtesy for the patient 5.  Appointment with diabetic shoe department for new diabetic shoes and custom molded Plastizote insoles.  Order placed 6.  Return to clinic 3 months for routine footcare    Felecia Shelling, DPM [facility]  Dr. Felecia Shelling, DPM    [address]  [place], [place] [address]                Office [phone]  Fax [phone]

## 2022-08-04 DIAGNOSIS — H43823 Vitreomacular adhesion, bilateral: Secondary | ICD-10-CM | POA: Diagnosis not present

## 2022-08-04 DIAGNOSIS — H43811 Vitreous degeneration, right eye: Secondary | ICD-10-CM | POA: Diagnosis not present

## 2022-08-04 DIAGNOSIS — E113492 Type 2 diabetes mellitus with severe nonproliferative diabetic retinopathy without macular edema, left eye: Secondary | ICD-10-CM | POA: Diagnosis not present

## 2022-08-04 DIAGNOSIS — E113411 Type 2 diabetes mellitus with severe nonproliferative diabetic retinopathy with macular edema, right eye: Secondary | ICD-10-CM | POA: Diagnosis not present

## 2022-08-04 NOTE — Progress Notes (Addendum)
  THERAPIST PROGRESS NOTE  Session Time: 1010-11a  Behavioral Health [facility] in office visit for patient and LCSW clinician  Participation Level: Active  Behavioral Response: NAAlertDepressed  Type of Therapy: Individual Therapy  Treatment Goals addressed: Develop healthy thinking patterns and beliefs about self, others, and the world that lead to the alleviation and help prevent the relapse of depression per self report 3 out of 5 sessions documented    ProgressTowards Goals: Progressing  Interventions: Motivational Interviewing, Strength-based, Supportive, and Reframing  Summary: [name] is a 66 y.o. female who presents with continuing symptoms associated with depression diagnosis.  Patient reports that she is compliant with her medication.  Pt reports ongoing medical concerns including diabetes and constant foot issues/problems. Allowed pt to identify current symptoms, treatments, and future treatment plans/recommendations. Pt reports she goes to a foot doctor on a regular basis. Clinician and patient continued to discuss problem solving regarding complex medical history, implementation and practice of relaxation activities and self-care,  addressing impact of current medications, and encouraging patient to communicate thoughts and feelings to medical professionals, and anyone else in their inner circle. Discussed ongoing psychological impact of pt medical history with diabetes.   Assisted pt with identifying anxiety triggers. Pt often feels her marital relationship is a big anxiety trigger. Pt feels her husband is unpredictable--sometimes he is caring and other times he is demeaning in the way he talks with [name] Allowed pt to explore and identify any limits or boundaries that could be helpful with managing stress/anxiety triggers.   Continued recommendations are as follows: self care behaviors, positive social engagements, focusing on overall work/home/life  balance, and focusing on positive physical and emotional wellness.   Suicidal/Homicidal: No  Therapist Response: Pt is continuing to apply interventions learned in session into daily life situations. Pt is currently on track to meet goals utilizing interventions mentioned above. Personal growth and progress noted. Treatment to continue as indicated.   Patient feels that she is trying harder overall to improve self-care and life management.  Developments continue in the areas of family and relational functioning.  Plan: Return again in 4 weeks.  Patient states that she will reach out to her medication management prescriber with information about the escalating depression symptoms.  Diagnosis:  Encounter Diagnoses  Name Primary?   MDD (major depressive disorder), recurrent episode, moderate (HCC) Yes   Situational anxiety    Collaboration of Care: Other pt to continue follow ups with PCP [facility] and Dr. Edison Pace at [facility]  Patient/Guardian was advised Release of Information must be obtained prior to any record release in order to collaborate their care with an outside provider. Patient/Guardian was advised if they have not already done so to contact the registration department to sign all necessary forms in order for [place] to release information regarding their care.   Consent: Patient/Guardian gives verbal consent for treatment and assignment of benefits for services provided during this visit. Patient/Guardian expressed understanding and agreed to proceed  Portions of this report may have been transcribed using voice recognition software. Every effort was made to ensure accuracy; however, inadvertent computerized transcription errors may be present .   Ernest Haber Tira Lafferty, LCSW 08/04/2022

## 2022-08-09 ENCOUNTER — Telehealth: Payer: Self-pay

## 2022-08-09 NOTE — Telephone Encounter (Signed)
I called pt. I advised pt that Dr. Frances Furbish reviewed their sleep study results and found that pt has severe OSA. Dr. Frances Furbish recommends that pt continue on current CPAP settings. I reviewed PAP compliance expectations with the pt. Pt is agreeable to starting a CPAP. I advised pt that an order will be sent to a DME, [facility], and [facility] will call the pt within about one week after they file with the pt's insurance. [facility] will show the pt how to use the machine, fit for masks, and troubleshoot the CPAP if needed. A follow up appt was made for insurance purposes with Margie Ege, NP on 10/18/22. Pt verbalized understanding to arrive 15 minutes early and bring their CPAP. Pt verbalized understanding of results. Pt had no questions at this time but was encouraged to call back if questions arise. I have sent the order to [employer].

## 2022-08-15 ENCOUNTER — Ambulatory Visit (INDEPENDENT_AMBULATORY_CARE_PROVIDER_SITE_OTHER): Payer: Medicare Other | Admitting: Podiatry

## 2022-08-15 ENCOUNTER — Ambulatory Visit (INDEPENDENT_AMBULATORY_CARE_PROVIDER_SITE_OTHER): Payer: Medicare Other

## 2022-08-15 DIAGNOSIS — L97522 Non-pressure chronic ulcer of other part of left foot with fat layer exposed: Secondary | ICD-10-CM

## 2022-08-15 DIAGNOSIS — L03116 Cellulitis of left lower limb: Secondary | ICD-10-CM

## 2022-08-15 DIAGNOSIS — M79672 Pain in left foot: Secondary | ICD-10-CM | POA: Diagnosis not present

## 2022-08-15 DIAGNOSIS — E0843 Diabetes mellitus due to underlying condition with diabetic autonomic (poly)neuropathy: Secondary | ICD-10-CM

## 2022-08-15 DIAGNOSIS — L03032 Cellulitis of left toe: Secondary | ICD-10-CM

## 2022-08-15 DIAGNOSIS — L97529 Non-pressure chronic ulcer of other part of left foot with unspecified severity: Secondary | ICD-10-CM | POA: Diagnosis not present

## 2022-08-15 MED ORDER — SULFAMETHOXAZOLE-TRIMETHOPRIM 800-160 MG PO TABS: 1.0000 | ORAL_TABLET | Freq: Two times a day (BID) | ORAL | 0 refills | Status: DC

## 2022-08-15 MED ORDER — GENTAMICIN SULFATE 0.1 % EX OINT: TOPICAL_OINTMENT | Freq: Three times a day (TID) | CUTANEOUS | 1 refills | Status: DC

## 2022-08-15 MED ORDER — CIPROFLOXACIN HCL 500 MG PO TABS: 500.0000 mg | ORAL_TABLET | Freq: Two times a day (BID) | ORAL | 0 refills | Status: AC

## 2022-08-15 NOTE — Progress Notes (Signed)
Chief Complaint  Patient presents with   Diabetic Ulcer    left foot great toe - swollen, red, draining pus    SUBJECTIVE Patient PMHx T2DM history of prior amputations to the toes presenting today for occurrence of new ulcers that have developed to the left fourth toe as well as the lateral portion of the left great toe.  She was last seen 08/03/2022 for routine footcare and doing well.  Over the past 12 days she has developed redness with swelling to the toes and foot.  She presents today not feeling well.  Febrile.  Temperature 100.6.  Past Medical History:  Diagnosis Date   Anxiety    Arthritis    knees   Coronary artery disease    Depression    Fatty liver    History of nuclear stress test 12/16/2011   exercise myoview; normal images with 2-16mm ST-segment depression - subsequent cath revelaed subtotally occluded small 2nd marginal branch & 75% PDA lesion, normal LV function   Hypertension    Hypothyroidism    Neuropathy    in feet   OSA on CPAP    AHI = 44 (per patient) setting 13 per pt   Personal history of kidney stones    Primary localized osteoarthritis of left knee 04/24/2015   Primary localized osteoarthritis of right knee 07/02/2015   Tobacco abuse    Type 2 diabetes mellitus (HCC)    insulin pump    Allergies  Allergen Reactions   Penicillins Hives    Has patient had a PCN reaction causing immediate rash, facial/tongue/throat swelling, SOB or lightheadedness with hypotension: Unknown Has patient had a PCN reaction causing severe rash involving mucus membranes or skin necrosis: Unknown Has patient had a PCN reaction that required hospitalization: Unknown Has patient had a PCN reaction occurring within the last 10 years: No If all of the above answers are "NO", then may proceed with Cephalosporin use.     OBJECTIVE General Patient is awake, alert, and oriented x 3.  Generalized malaise and lethargic upon presentation. Derm ulcer noted to the distal aspect  of the left fourth toe involving the nail plate.  The nail plate is loosely adhered.  No purulence.  There is some slight malodor to the distal tip of the toe and ulcer site. Erythema with drainage also noted to the lateral border of the left great toe with purulence at the base of the nail plate. Vasc  DP and PT pedal pulses palpable left.  Edema with erythema noted specifically to the great toe and the left fourth toe with some erythema extending into the foot.  Generalized edema in the foot and leg Neuro light touch and protective threshold absent Musculoskeletal Exam history of prior toe amputations  ASSESSMENT 1.  Ulcer fourth toe left foot 2.  Paronychia lateral border left great toe 3.  Diabetes mellitus with peripheral polyneuropathy  PLAN OF CARE 1. Patient evaluated today.  X-rays reviewed.  After evaluating the patient I really feel that she needs to go to the emergency department for more comprehensive workup and evaluation.  She presents today febrile and lethargic.  Acute infection with history of prior amputations secondary to diabetic foot infections.  Patient understands but does not want to go to the emergency department and really wants to try oral antibiotics prior to going to the emergency department.  Ultimately advised against this.  Patient states that she will go to the emergency department if she does not begin to feel  significant improvement over the following 24 hours. 2.  Medically necessary excisional debridement including subcutaneous tissue was performed today to the ulcer to the distal tip of the left fourth toe.  The nail plate was loosely adhered and it was removed in toto.  No need for anesthesia since the patient was completely neuropathic.  Adequate perfusion of the toe with healthy bleeding noted with debridement. 3.  Attention then directed to the lateral border of the left hallux nail plate and decision was made today to perform a partial temporary nail avulsion  of the offending border of the nail plate creating the erythema with slight purulence and drainage to this area.  The lateral border of the nail plate was avulsed in the standard manner again without anesthesia since the patient is neuropathic. 4.  Betadine ointment applied with dry dressings to both toe wound and the nail avulsion site. 5.  Prior to dressings culture was taken to the left fourth toe and sent to pathology for culture and sensitivity 6.  Postsurgical shoe dispensed.  WBAT 7.  Prescription for Bactrim DS + Cipro500 BID sent to the pharmacy x 10 days. 8.  Prescription for gentamicin cream to apply to the nail avulsion site as well as the ulcer to the distal tip of the left fourth toe 9.  Again stressed the importance that I do recommend she goes to the emergency department for a more comprehensive workup and blood work.  Given the patient's history of prior diabetic foot infections and toe amputations as well as her presentation today being febrile and lethargic.  Patient understands but does not want to go to the emergency department.  She really wants to try the oral antibiotics first.  Stressed that if she does not improve over 24 hours she really needs to go to the emergency department soon as possible. 10.  If she begins to notice significant improvement and decides not to go to the emergency department, return to the clinic in 1 week for reevaluation    Felecia Shelling, DPM [facility]  Dr. Felecia Shelling, DPM    [address] [place], [place] [address]                Office [phone]  Fax [phone]

## 2022-08-16 ENCOUNTER — Inpatient Hospital Stay (HOSPITAL_COMMUNITY): Payer: Medicare Other

## 2022-08-16 ENCOUNTER — Encounter (HOSPITAL_COMMUNITY): Payer: Self-pay

## 2022-08-16 ENCOUNTER — Ambulatory Visit
Admission: RE | Admit: 2022-08-16 | Discharge: 2022-08-16 | Disposition: A | Discharge status: Home / Self Care | Payer: Medicare Other | Source: Ambulatory Visit | Attending: Internal Medicine | Admitting: Internal Medicine

## 2022-08-16 ENCOUNTER — Other Ambulatory Visit: Payer: Self-pay | Admitting: Internal Medicine

## 2022-08-16 ENCOUNTER — Inpatient Hospital Stay (HOSPITAL_COMMUNITY)
Admission: EM | Admit: 2022-08-16 | Discharge: 2022-08-19 | DRG: 617 | Disposition: A | Discharge status: Home / Self Care | Payer: Medicare Other | Attending: Internal Medicine | Admitting: Internal Medicine

## 2022-08-16 ENCOUNTER — Other Ambulatory Visit: Payer: Self-pay

## 2022-08-16 DIAGNOSIS — F419 Anxiety disorder, unspecified: Secondary | ICD-10-CM | POA: Diagnosis present

## 2022-08-16 DIAGNOSIS — E1165 Type 2 diabetes mellitus with hyperglycemia: Secondary | ICD-10-CM | POA: Diagnosis present

## 2022-08-16 DIAGNOSIS — F17210 Nicotine dependence, cigarettes, uncomplicated: Secondary | ICD-10-CM | POA: Diagnosis present

## 2022-08-16 DIAGNOSIS — M2042 Other hammer toe(s) (acquired), left foot: Secondary | ICD-10-CM | POA: Diagnosis present

## 2022-08-16 DIAGNOSIS — N1832 Chronic kidney disease, stage 3b: Secondary | ICD-10-CM | POA: Diagnosis present

## 2022-08-16 DIAGNOSIS — Z9842 Cataract extraction status, left eye: Secondary | ICD-10-CM

## 2022-08-16 DIAGNOSIS — N289 Disorder of kidney and ureter, unspecified: Secondary | ICD-10-CM

## 2022-08-16 DIAGNOSIS — E785 Hyperlipidemia, unspecified: Secondary | ICD-10-CM | POA: Diagnosis not present

## 2022-08-16 DIAGNOSIS — E11621 Type 2 diabetes mellitus with foot ulcer: Secondary | ICD-10-CM | POA: Diagnosis present

## 2022-08-16 DIAGNOSIS — M85872 Other specified disorders of bone density and structure, left ankle and foot: Secondary | ICD-10-CM | POA: Diagnosis not present

## 2022-08-16 DIAGNOSIS — F32A Depression, unspecified: Secondary | ICD-10-CM | POA: Diagnosis present

## 2022-08-16 DIAGNOSIS — Z8249 Family history of ischemic heart disease and other diseases of the circulatory system: Secondary | ICD-10-CM

## 2022-08-16 DIAGNOSIS — M7989 Other specified soft tissue disorders: Secondary | ICD-10-CM | POA: Diagnosis not present

## 2022-08-16 DIAGNOSIS — Z961 Presence of intraocular lens: Secondary | ICD-10-CM | POA: Diagnosis present

## 2022-08-16 DIAGNOSIS — E1142 Type 2 diabetes mellitus with diabetic polyneuropathy: Secondary | ICD-10-CM

## 2022-08-16 DIAGNOSIS — M869 Osteomyelitis, unspecified: Secondary | ICD-10-CM | POA: Diagnosis not present

## 2022-08-16 DIAGNOSIS — E1169 Type 2 diabetes mellitus with other specified complication: Secondary | ICD-10-CM | POA: Diagnosis not present

## 2022-08-16 DIAGNOSIS — E11649 Type 2 diabetes mellitus with hypoglycemia without coma: Secondary | ICD-10-CM | POA: Diagnosis not present

## 2022-08-16 DIAGNOSIS — Z794 Long term (current) use of insulin: Secondary | ICD-10-CM

## 2022-08-16 DIAGNOSIS — E11628 Type 2 diabetes mellitus with other skin complications: Principal | ICD-10-CM

## 2022-08-16 DIAGNOSIS — Z9049 Acquired absence of other specified parts of digestive tract: Secondary | ICD-10-CM

## 2022-08-16 DIAGNOSIS — Z96653 Presence of artificial knee joint, bilateral: Secondary | ICD-10-CM | POA: Diagnosis present

## 2022-08-16 DIAGNOSIS — E039 Hypothyroidism, unspecified: Secondary | ICD-10-CM | POA: Diagnosis not present

## 2022-08-16 DIAGNOSIS — Z79890 Hormone replacement therapy (postmenopausal): Secondary | ICD-10-CM

## 2022-08-16 DIAGNOSIS — Z6841 Body Mass Index (BMI) 40.0 and over, adult: Secondary | ICD-10-CM | POA: Diagnosis not present

## 2022-08-16 DIAGNOSIS — E1122 Type 2 diabetes mellitus with diabetic chronic kidney disease: Secondary | ICD-10-CM | POA: Diagnosis present

## 2022-08-16 DIAGNOSIS — Z9641 Presence of insulin pump (external) (internal): Secondary | ICD-10-CM | POA: Diagnosis present

## 2022-08-16 DIAGNOSIS — R2681 Unsteadiness on feet: Secondary | ICD-10-CM | POA: Diagnosis not present

## 2022-08-16 DIAGNOSIS — Z79899 Other long term (current) drug therapy: Secondary | ICD-10-CM

## 2022-08-16 DIAGNOSIS — G4733 Obstructive sleep apnea (adult) (pediatric): Secondary | ICD-10-CM | POA: Diagnosis not present

## 2022-08-16 DIAGNOSIS — Z89421 Acquired absence of other right toe(s): Secondary | ICD-10-CM

## 2022-08-16 DIAGNOSIS — L089 Local infection of the skin and subcutaneous tissue, unspecified: Secondary | ICD-10-CM | POA: Diagnosis not present

## 2022-08-16 DIAGNOSIS — Z90710 Acquired absence of both cervix and uterus: Secondary | ICD-10-CM

## 2022-08-16 DIAGNOSIS — M25552 Pain in left hip: Secondary | ICD-10-CM

## 2022-08-16 DIAGNOSIS — I129 Hypertensive chronic kidney disease with stage 1 through stage 4 chronic kidney disease, or unspecified chronic kidney disease: Secondary | ICD-10-CM | POA: Diagnosis not present

## 2022-08-16 DIAGNOSIS — E1129 Type 2 diabetes mellitus with other diabetic kidney complication: Secondary | ICD-10-CM | POA: Diagnosis not present

## 2022-08-16 DIAGNOSIS — Z880 Allergy status to penicillin: Secondary | ICD-10-CM

## 2022-08-16 DIAGNOSIS — M2012 Hallux valgus (acquired), left foot: Secondary | ICD-10-CM | POA: Diagnosis not present

## 2022-08-16 DIAGNOSIS — E669 Obesity, unspecified: Secondary | ICD-10-CM | POA: Diagnosis present

## 2022-08-16 DIAGNOSIS — I2510 Atherosclerotic heart disease of native coronary artery without angina pectoris: Secondary | ICD-10-CM | POA: Diagnosis not present

## 2022-08-16 DIAGNOSIS — I10 Essential (primary) hypertension: Secondary | ICD-10-CM

## 2022-08-16 DIAGNOSIS — R600 Localized edema: Secondary | ICD-10-CM | POA: Diagnosis not present

## 2022-08-16 DIAGNOSIS — Z87442 Personal history of urinary calculi: Secondary | ICD-10-CM

## 2022-08-16 DIAGNOSIS — Z833 Family history of diabetes mellitus: Secondary | ICD-10-CM

## 2022-08-16 DIAGNOSIS — L03116 Cellulitis of left lower limb: Secondary | ICD-10-CM | POA: Diagnosis present

## 2022-08-16 DIAGNOSIS — M86672 Other chronic osteomyelitis, left ankle and foot: Secondary | ICD-10-CM | POA: Diagnosis not present

## 2022-08-16 DIAGNOSIS — M19072 Primary osteoarthritis, left ankle and foot: Secondary | ICD-10-CM | POA: Diagnosis not present

## 2022-08-16 DIAGNOSIS — Z9841 Cataract extraction status, right eye: Secondary | ICD-10-CM

## 2022-08-16 DIAGNOSIS — L97509 Non-pressure chronic ulcer of other part of unspecified foot with unspecified severity: Secondary | ICD-10-CM | POA: Diagnosis not present

## 2022-08-16 DIAGNOSIS — L97529 Non-pressure chronic ulcer of other part of left foot with unspecified severity: Secondary | ICD-10-CM | POA: Diagnosis not present

## 2022-08-16 DIAGNOSIS — L03032 Cellulitis of left toe: Secondary | ICD-10-CM | POA: Diagnosis not present

## 2022-08-16 LAB — COMPREHENSIVE METABOLIC PANEL
ALT: 14 U/L (ref 0–44)
AST: 15 U/L (ref 15–41)
Albumin: 3.7 g/dL (ref 3.5–5.0)
Alkaline Phosphatase: 116 U/L (ref 38–126)
Anion gap: 9 (ref 5–15)
BUN: 23 mg/dL (ref 8–23)
CO2: 24 mmol/L (ref 22–32)
Calcium: 9.6 mg/dL (ref 8.9–10.3)
Chloride: 100 mmol/L (ref 98–111)
Creatinine, Ser: 1.37 mg/dL — ABNORMAL HIGH (ref 0.44–1.00)
GFR, Estimated: 42 mL/min — ABNORMAL LOW (ref >60)
Glucose, Bld: 126 mg/dL — ABNORMAL HIGH (ref 70–99)
Potassium: 4.7 mmol/L (ref 3.5–5.1)
Sodium: 133 mmol/L — ABNORMAL LOW (ref 135–145)
Total Bilirubin: 0.8 mg/dL (ref 0.3–1.2)
Total Protein: 7.7 g/dL (ref 6.5–8.1)

## 2022-08-16 LAB — CBC WITH DIFFERENTIAL/PLATELET
Abs Immature Granulocytes: 0.12 10*3/uL — ABNORMAL HIGH (ref 0.00–0.07)
Basophils Absolute: 0.1 10*3/uL (ref 0.0–0.1)
Basophils Relative: 1 %
Eosinophils Absolute: 0.2 10*3/uL (ref 0.0–0.5)
Eosinophils Relative: 1 %
HCT: 39.1 % (ref 36.0–46.0)
Hemoglobin: 12.7 g/dL (ref 12.0–15.0)
Immature Granulocytes: 1 %
Lymphocytes Relative: 16 %
Lymphs Abs: 2.5 10*3/uL (ref 0.7–4.0)
MCH: 32.6 pg (ref 26.0–34.0)
MCHC: 32.5 g/dL (ref 30.0–36.0)
MCV: 100.5 fL — ABNORMAL HIGH (ref 80.0–100.0)
Monocytes Absolute: 1 10*3/uL (ref 0.1–1.0)
Monocytes Relative: 7 %
Neutro Abs: 11.4 10*3/uL — ABNORMAL HIGH (ref 1.7–7.7)
Neutrophils Relative %: 74 %
Platelets: 223 10*3/uL (ref 150–400)
RBC: 3.89 MIL/uL (ref 3.87–5.11)
RDW: 13.4 % (ref 11.5–15.5)
WBC: 15.4 10*3/uL — ABNORMAL HIGH (ref 4.0–10.5)
nRBC: 0 % (ref 0.0–0.2)

## 2022-08-16 LAB — LACTIC ACID, PLASMA
Lactic Acid, Venous: 2 mmol/L (ref 0.5–1.9)
Lactic Acid, Venous: 1.2 mmol/L (ref 0.5–1.9)

## 2022-08-16 LAB — CBG MONITORING, ED: Glucose-Capillary: 107 mg/dL — ABNORMAL HIGH (ref 70–99)

## 2022-08-16 MED ORDER — SODIUM CHLORIDE 0.9 % IV SOLN
2.0000 g | Freq: Once | INTRAVENOUS | Status: DC
  Filled 2022-08-16: qty 12.5

## 2022-08-16 MED ORDER — VANCOMYCIN HCL 2000 MG/400ML IV SOLN
2000.0000 mg | Freq: Once | INTRAVENOUS | Status: AC
  Administered 2022-08-17: 2000 mg via INTRAVENOUS
  Filled 2022-08-16 (×2): qty 400

## 2022-08-16 MED ORDER — ENOXAPARIN SODIUM 60 MG/0.6ML IJ SOSY
60.0000 mg | PREFILLED_SYRINGE | INTRAMUSCULAR | Status: DC
  Administered 2022-08-17 – 2022-08-19 (×2): 60 mg via SUBCUTANEOUS
  Filled 2022-08-16 (×3): qty 0.6

## 2022-08-16 MED ORDER — DULOXETINE HCL 60 MG PO CPEP
60.0000 mg | ORAL_CAPSULE | Freq: Every day | ORAL | Status: DC
  Administered 2022-08-17 – 2022-08-19 (×3): 60 mg via ORAL
  Filled 2022-08-16 (×3): qty 1

## 2022-08-16 MED ORDER — ONDANSETRON HCL 4 MG PO TABS: 4.0000 mg | ORAL_TABLET | Freq: Four times a day (QID) | ORAL | Status: DC | PRN

## 2022-08-16 MED ORDER — GABAPENTIN 300 MG PO CAPS
600.0000 mg | ORAL_CAPSULE | Freq: Three times a day (TID) | ORAL | Status: DC
  Administered 2022-08-16 – 2022-08-19 (×7): 600 mg via ORAL
  Filled 2022-08-16 (×7): qty 2

## 2022-08-16 MED ORDER — VANCOMYCIN HCL 1500 MG/300ML IV SOLN: 1500.0000 mg | INTRAVENOUS | Status: DC

## 2022-08-16 MED ORDER — BUPROPION HCL ER (XL) 300 MG PO TB24
300.0000 mg | ORAL_TABLET | Freq: Every day | ORAL | Status: DC
  Administered 2022-08-17 – 2022-08-19 (×3): 300 mg via ORAL
  Filled 2022-08-16 (×3): qty 1

## 2022-08-16 MED ORDER — ATORVASTATIN CALCIUM 20 MG PO TABS
20.0000 mg | ORAL_TABLET | Freq: Every day | ORAL | Status: DC
  Administered 2022-08-17 – 2022-08-19 (×3): 20 mg via ORAL
  Filled 2022-08-16 (×3): qty 1

## 2022-08-16 MED ORDER — ACETAMINOPHEN 650 MG RE SUPP: 650.0000 mg | Freq: Four times a day (QID) | RECTAL | Status: DC | PRN

## 2022-08-16 MED ORDER — METRONIDAZOLE 500 MG PO TABS
500.0000 mg | ORAL_TABLET | Freq: Two times a day (BID) | ORAL | Status: DC
  Administered 2022-08-16 – 2022-08-19 (×6): 500 mg via ORAL
  Filled 2022-08-16 (×6): qty 1

## 2022-08-16 MED ORDER — LOSARTAN POTASSIUM 50 MG PO TABS
100.0000 mg | ORAL_TABLET | Freq: Every day | ORAL | Status: DC
  Administered 2022-08-17 – 2022-08-19 (×3): 100 mg via ORAL
  Filled 2022-08-16 (×3): qty 2

## 2022-08-16 MED ORDER — LOSARTAN POTASSIUM-HCTZ 100-12.5 MG PO TABS: 1.0000 | ORAL_TABLET | Freq: Every day | ORAL | Status: DC

## 2022-08-16 MED ORDER — ONDANSETRON HCL 4 MG/2ML IJ SOLN
4.0000 mg | Freq: Four times a day (QID) | INTRAMUSCULAR | Status: DC | PRN
  Administered 2022-08-18: 4 mg via INTRAVENOUS

## 2022-08-16 MED ORDER — INSULIN PUMP
Freq: Three times a day (TID) | SUBCUTANEOUS | Status: DC
  Administered 2022-08-17: 11.2 via SUBCUTANEOUS
  Administered 2022-08-17: 3.4 via SUBCUTANEOUS
  Administered 2022-08-17: 22.2 via SUBCUTANEOUS
  Administered 2022-08-18: 8.6 via SUBCUTANEOUS
  Filled 2022-08-16: qty 1

## 2022-08-16 MED ORDER — GADOBUTROL 1 MMOL/ML IV SOLN
10.0000 mL | Freq: Once | INTRAVENOUS | Status: AC | PRN
  Administered 2022-08-17: 10 mL via INTRAVENOUS

## 2022-08-16 MED ORDER — HYDROCHLOROTHIAZIDE 12.5 MG PO TABS
12.5000 mg | ORAL_TABLET | Freq: Every day | ORAL | Status: DC
  Administered 2022-08-17 – 2022-08-19 (×3): 12.5 mg via ORAL
  Filled 2022-08-16 (×3): qty 1

## 2022-08-16 MED ORDER — ACETAMINOPHEN 325 MG PO TABS
650.0000 mg | ORAL_TABLET | Freq: Four times a day (QID) | ORAL | Status: DC | PRN
  Administered 2022-08-17 – 2022-08-18 (×2): 650 mg via ORAL
  Filled 2022-08-16 (×2): qty 2

## 2022-08-16 MED ORDER — SODIUM CHLORIDE 0.9 % IV SOLN
2.0000 g | INTRAVENOUS | Status: DC
  Administered 2022-08-17 – 2022-08-19 (×3): 2 g via INTRAVENOUS
  Filled 2022-08-16 (×3): qty 20

## 2022-08-16 MED ORDER — LEVOTHYROXINE SODIUM 25 MCG PO TABS
137.0000 ug | ORAL_TABLET | Freq: Every day | ORAL | Status: DC
  Administered 2022-08-17 – 2022-08-19 (×3): 137 ug via ORAL
  Filled 2022-08-16 (×4): qty 1

## 2022-08-16 NOTE — Progress Notes (Signed)
A consult was received from an ED physician for Vancomycin + Cefepime per pharmacy dosing.  The patient's profile has been reviewed for ht/wt/allergies/indication/available labs.   A one time order has been placed for Cefpeime 2gm + Vancomycin 2gm IV.   Further antibiotics/pharmacy consults should be ordered by admitting physician if indicated.                       Thank you, Junita Push PharmD 08/16/2022  10:07 PM

## 2022-08-16 NOTE — H&P (Signed)
History and Physical    Patient: [name]:[id number] DOB: 1956/02/10 DOA: 08/16/2022 DOS: the patient was seen and examined on 08/16/2022 PCP: Geoffry Paradise, MD  Patient coming from: Home  Chief Complaint:  Chief Complaint  Patient presents with   Wound Check   HPI: [name] is a 67 y.o. female with medical history significant of DM2, HTN, diabetic neuropathy, prior toe amputation x2 to R foot.  Pt with ulcer of L toe, infected for past 12 days or so.  Saw Dr. Logan Bores (DPM) in office yesterday.  Dr. Logan Bores tried to get pt to go to ED but pt declined so started on PO ABx.  Infection persists.  Pt in to ED today.  Fever 100.6 in Dr. Logan Bores office yesterday.  Erythema to top of foot.  Pt with severe neuropathy in feet due to longstanding diabetes.  No pain in foot at all despite ulcer and infection (loss of sensation in feet due to DM).    Review of Systems: As mentioned in the history of present illness. All other systems reviewed and are negative. Past Medical History:  Diagnosis Date   Anxiety    Arthritis    knees   Coronary artery disease    Depression    Fatty liver    History of nuclear stress test 12/16/2011   exercise myoview; normal images with 2-36mm ST-segment depression - subsequent cath revelaed subtotally occluded small 2nd marginal branch & 75% PDA lesion, normal LV function   Hypertension    Hypothyroidism    Neuropathy    in feet   OSA on CPAP    AHI = 44 (per patient) setting 13 per pt   Personal history of kidney stones    Primary localized osteoarthritis of left knee 04/24/2015   Primary localized osteoarthritis of right knee 07/02/2015   Tobacco abuse    Type 2 diabetes mellitus (HCC)    insulin pump   Past Surgical History:  Procedure Laterality Date   3rd right toe amputation  02/2016   AMPUTATION Right 12/22/2014   Procedure: RIGHT 2ND TOE AMPUTATION;  Surgeon: Toni Arthurs, MD;  Location: [facility];  Service: Orthopedics;   Laterality: Right;   AUGMENTATION MAMMAPLASTY Bilateral    BREAST ENHANCEMENT SURGERY Bilateral    CARDIAC CATHETERIZATION  01/04/2012   subtotally occluded small 2nd marginal branch & 75% PDA lesion, normal LV function   CATARACT EXTRACTION Bilateral    with lens implants   CHOLECYSTECTOMY  2008   COLONOSCOPY     COLONOSCOPY WITH PROPOFOL N/A 05/12/2016   Procedure: COLONOSCOPY WITH PROPOFOL;  Surgeon: Charna Elizabeth, MD;  Location: [facility];  Service: Endoscopy;  Laterality: N/A;   I & D KNEE WITH POLY EXCHANGE Left 05/13/2015   Procedure: IRRIGATION AND DEBRIDEMENT KNEE WITH POLY EXCHANGE;  Surgeon: Teryl Lucy, MD;  Location: [facility] OR;  Service: Orthopedics;  Laterality: Left;   LEFT HEART CATHETERIZATION WITH CORONARY ANGIOGRAM N/A 01/04/2012   Procedure: LEFT HEART CATHETERIZATION WITH CORONARY ANGIOGRAM;  Surgeon: Runell Gess, MD;  Location: [facility] CATH LAB;  Service: Cardiovascular;  Laterality: N/A;   PARTIAL KNEE ARTHROPLASTY Left 04/24/2015   Procedure: LEFT UNICOMPARTMENTAL KNEE ARTHROPLASTY ;  Surgeon: Teryl Lucy, MD;  Location: [facility];  Service: Orthopedics;  Laterality: Left;   PARTIAL KNEE ARTHROPLASTY Right 07/02/2015   Procedure: RIGHT UNI KNEE ARTHROPLASTY;  Surgeon: Teryl Lucy, MD;  Location: [facility];  Service: Orthopedics;  Laterality: Right;  ANESTHESIA:  GENERAL, PRE/POST  OP FEMORAL NERVE   POSTERIOR CERVICAL FUSION/FORAMINOTOMY  1990   ROBOTIC ASSISTED TOTAL HYSTERECTOMY WITH BILATERAL SALPINGO OOPHERECTOMY Bilateral 02/17/2015   Procedure: ROBOTIC ASSISTED TOTAL HYSTERECTOMY WITH BILATERAL SALPINGO OOPHORECTOMY;  Surgeon: Adolphus Birchwood, MD;  Location: [facility];  Service: Gynecology;  Laterality: Bilateral;   Social History:  reports that she has been smoking cigarettes. She has a 25.00 pack-year smoking history. She has never used smokeless tobacco. She reports that she does not drink alcohol and does not use drugs.  Allergies   Allergen Reactions   Penicillins Hives    Did it involve swelling of the face/tongue/throat, SOB, or low BP? No Did it involve sudden or severe rash/hives, skin peeling, or any reaction on the inside of your mouth or nose? Yes Did you need to seek medical attention at a hospital or doctor's office? No When did it last happen?  childhood     If all above answers are "NO", may proceed with cephalosporin use.      Family History  Problem Relation Age of Onset   Stroke Mother    Hypertension Mother    Diabetes Mother    Alzheimer's disease Mother    Diabetes Father    COPD Father        vent-dependent, MODS   Hypertension Sister    Mental illness Sister        borderline personality d/o   Mental illness Sister        schizoeffective d/o   Diabetes Sister    Sleep apnea Neg Hx     Prior to Admission medications   Medication Sig Start Date End Date Taking? Authorizing Provider  atorvastatin (LIPITOR) 20 MG tablet Take 20 mg by mouth daily. 10/12/14  Yes [provider]  buPROPion (WELLBUTRIN XL) 300 MG 24 hr tablet Take 300 mg by mouth daily.   Yes [provider]  ciprofloxacin (CIPRO) 500 MG tablet Take 1 tablet (500 mg total) by mouth 2 (two) times daily for 10 days. 08/15/22 08/25/22 Yes Felecia Shelling, DPM  DULoxetine (CYMBALTA) 60 MG capsule Take 60 mg by mouth daily. 06/25/21  Yes [provider]  gabapentin (NEURONTIN) 300 MG capsule Take 600 mg by mouth 3 (three) times daily.  10/12/14  Yes [provider]  Insulin Human (INSULIN PUMP) SOLN Inject 1 each into the skin continuous. Humalog - basal rate 3.2 Units/hr Patient taking differently: Inject 1 each into the skin continuous. Novolog - basal rate 3.2 units/hr 10/08/16  Yes Glade Lloyd, MD  levothyroxine (SYNTHROID, LEVOTHROID) 137 MCG tablet Take 137 mcg by mouth daily before breakfast. 09/13/17  Yes [provider]  losartan-hydrochlorothiazide (HYZAAR) 100-12.5 MG tablet Take 1  tablet by mouth daily. 10/27/16  Yes [provider]  sulfamethoxazole-trimethoprim (BACTRIM DS) 800-160 MG tablet Take 1 tablet by mouth 2 (two) times daily. 08/15/22  Yes Felecia Shelling, DPM  Continuous Blood Gluc Receiver (FREESTYLE LIBRE 14 DAY READER) DEVI Use to monitor blood glucose 12/22/16   [provider]  Continuous Blood Gluc Sensor (FREESTYLE LIBRE 14 DAY SENSOR) MISC Use to monitor blood glucose continuously-Dx code E11.29 12/22/16   [provider]  OVER THE COUNTER MEDICATION Pt uses CPAP machine daily.    [provider]    Physical Exam: Vitals:   08/16/22 1921 08/16/22 2312  BP: (!) 144/68   Pulse: 82   Resp: 19   Temp: 98 F (36.7 C) 98.3 F (36.8 C)  TempSrc: Oral Oral  SpO2: 97%  Weight: 124.7 kg   Height: 5\' 7"  (1.702 m)    Constitutional: NAD, calm, comfortable Respiratory: clear to auscultation bilaterally, no wheezing, no crackles. Normal respiratory effort. No accessory muscle use.  Cardiovascular: Regular rate and rhythm, no murmurs / rubs / gallops. No extremity edema. 2+ pedal pulses. No carotid bruits.  Abdomen: no tenderness, no masses palpated. No hepatosplenomegaly. Bowel sounds positive.  Skin: Erythema edema, to top of 4th toe of  L foot and top of L foot (see photo below)  Ulcer to tip of L 4th toe (see below)   R foot has pre ulcerative callus it looks like, however no erythema, edema, exudate, or obvious open wound at this point. Neurologic: CN 2-12 grossly intact. Sensation intact, DTR normal. Strength 5/5 in all 4.  Psychiatric: Normal judgment and insight. Alert and oriented x 3. Normal mood.   Data Reviewed:    Labs on Admission: I have personally reviewed following labs and imaging studies  CBC: Recent Labs  Lab 08/16/22 1954  WBC 15.4*  NEUTROABS 11.4*  HGB 12.7  HCT 39.1  MCV 100.5*  PLT 223   Basic Metabolic Panel: Recent Labs  Lab 08/16/22 1954  NA 133*  K 4.7  CL 100  CO2 24   GLUCOSE 126*  BUN 23  CREATININE 1.37*  CALCIUM 9.6   GFR: Estimated Creatinine Clearance: 53.9 mL/min (A) (by C-G formula based on SCr of 1.37 mg/dL (H)). Liver Function Tests: Recent Labs  Lab 08/16/22 1954  AST 15  ALT 14  ALKPHOS 116  BILITOT 0.8  PROT 7.7  ALBUMIN 3.7   No results for input(s): "LIPASE", "AMYLASE" in the last 168 hours. No results for input(s): "AMMONIA" in the last 168 hours. Coagulation Profile: No results for input(s): "INR", "PROTIME" in the last 168 hours. Cardiac Enzymes: No results for input(s): "CKTOTAL", "CKMB", "CKMBINDEX", "TROPONINI" in the last 168 hours. BNP (last 3 results) No results for input(s): "PROBNP" in the last 8760 hours. HbA1C: No results for input(s): "HGBA1C" in the last 72 hours. CBG: Recent Labs  Lab 08/16/22 1941  GLUCAP 107*   Lipid Profile: No results for input(s): "CHOL", "HDL", "LDLCALC", "TRIG", "CHOLHDL", "LDLDIRECT" in the last 72 hours. Thyroid Function Tests: No results for input(s): "TSH", "T4TOTAL", "FREET4", "T3FREE", "THYROIDAB" in the last 72 hours. Anemia Panel: No results for input(s): "VITAMINB12", "FOLATE", "FERRITIN", "TIBC", "IRON", "RETICCTPCT" in the last 72 hours. Urine analysis:    Component Value Date/Time   COLORURINE YELLOW 10/06/2016 1231   APPEARANCEUR CLEAR 10/06/2016 1231   LABSPEC 1.016 10/06/2016 1231   PHURINE 6.0 10/06/2016 1231   GLUCOSEU NEGATIVE 10/06/2016 1231   HGBUR NEGATIVE 10/06/2016 1231   BILIRUBINUR NEGATIVE 10/06/2016 1231   KETONESUR NEGATIVE 10/06/2016 1231   PROTEINUR 30 (A) 10/06/2016 1231   UROBILINOGEN 1.0 11/03/2014 0823   NITRITE NEGATIVE 10/06/2016 1231   LEUKOCYTESUR NEGATIVE 10/06/2016 1231    Radiological Exams on Admission: DG HIP UNILAT WITH PELVIS 2-3 VIEWS LEFT  Result Date: 08/16/2022 CLINICAL DATA:  Left hip pain after fall 2 days ago. EXAM: DG HIP (WITH OR WITHOUT PELVIS) 2-3V LEFT COMPARISON:  None Available. FINDINGS: There is no  evidence of hip fracture or dislocation. There is no evidence of arthropathy or other focal bone abnormality. IMPRESSION: Negative. Electronically Signed   By: Lupita Raider M.D.   On: 08/16/2022 15:04    EKG: Independently reviewed.   Assessment and Plan: * Diabetic infection of left foot (HCC) LE wound pathway Got cefepime +  Vanc in ED Will leave pt on rocephin, flagyl, vanc for now MRSA risk factor = wound care within past 30 days MRSA PCR nares pending Wound culture pending MRI foot to eval for osteomyelitis ABI [place] ordered Dr. Logan Bores aware of admit will see in consult tomorrow. Will try to set up surgery on Thurs Will let pt eat therefore  Renal insufficiency Creat of 1.37 today. Not clear what her baseline creat is.  Creat was 1.1 back in 2021 but nothing more recent on file that I can see in epic.  Does have diabetic neuropathy, diabetic retinopathy, and foot ulcers, so wouldn't surprise me to find out she has some degree of diabetic nephropathy as well. Alternatively today's value could just be acute creatinine elevation without reduction in GFR due to her having been started on bactrim as outpt for foot infection after office visit yesterday. Close monitoring of renal fxn with daily BMP here in hospital. Bactrim held for the moment and pt on IV ABx instead.  Diabetic peripheral neuropathy associated with type 2 diabetes mellitus (HCC) BGL looks okay today in ED. Continue insulin pump for now per protocol. CBG checks AC/HS 0300  HTN (hypertension) Cont home Hyzaar  OSA on CPAP CPAP at bedtime per home settings  Obesity Consider if pt candidate for Mounjaro or Ozempic as outpt to both treat DM and assist with wt loss.      Advance Care Planning:   Code Status: Full Code  Consults: None  Family Communication: No family in room  Severity of Illness: The appropriate patient status for this patient is INPATIENT. Inpatient status is judged to be reasonable and  necessary in order to provide the required intensity of service to ensure the patient's safety. The patient's presenting symptoms, physical exam findings, and initial radiographic and laboratory data in the context of their chronic comorbidities is felt to place them at high risk for further clinical deterioration. Furthermore, it is not anticipated that the patient will be medically stable for discharge from the hospital within 2 midnights of admission.   * I certify that at the point of admission it is my clinical judgment that the patient will require inpatient hospital care spanning beyond 2 midnights from the point of admission due to high intensity of service, high risk for further deterioration and high frequency of surveillance required.*   Author: Hillary Bow., DO 08/16/2022 11:27 PM  For on call review [email or web address].

## 2022-08-16 NOTE — ED Triage Notes (Addendum)
Patient reports left fourth toe wound. Is diabetic and has toe amputations on the right foot but not the left. Denies any drainage, does endorse low grade fever yesterday and redness on toe. Started on PO abx yesterday from wound care dr.

## 2022-08-16 NOTE — Assessment & Plan Note (Signed)
CPAP at bedtime per home settings

## 2022-08-16 NOTE — Assessment & Plan Note (Signed)
LE wound pathway Got cefepime + Vanc in ED Will leave pt on rocephin, flagyl, vanc for now MRSA risk factor = wound care within past 30 days MRSA PCR nares pending Wound culture pending MRI foot to eval for osteomyelitis ABI [place] ordered Dr. Logan Bores aware of admit will see in consult tomorrow. Will try to set up surgery on Thurs Will let pt eat therefore

## 2022-08-16 NOTE — Progress Notes (Signed)
Pharmacy Antibiotic Note  [name] is a 67 y.o. female admitted on 08/16/2022 with  wound infection .  Pharmacy has been consulted for Vancomycin dosing. Baseline Scr <1. Imaging to r/o osteomyelitis pending.   Plan: Vancomycin 1500mg  IV q24h to target AUC 400-550.   Estimated AUC on this regimen = 490. Monitor renal function and cx data   Height: 5\' 7"  (170.2 cm) Weight: 124.7 kg (275 lb) IBW/kg (Calculated) : 61.6  Temp (24hrs), Avg:98 F (36.7 C), Min:98 F (36.7 C), Max:98 F (36.7 C)  Recent Labs  Lab 08/16/22 1954  WBC 15.4*  CREATININE 1.37*  LATICACIDVEN 2.0*    Estimated Creatinine Clearance: 53.9 mL/min (A) (by C-G formula based on SCr of 1.37 mg/dL (H)).    Allergies  Allergen Reactions   Penicillins Hives    Did it involve swelling of the face/tongue/throat, SOB, or low BP? No Did it involve sudden or severe rash/hives, skin peeling, or any reaction on the inside of your mouth or nose? Yes Did you need to seek medical attention at a hospital or doctor's office? No When did it last happen?  childhood     If all above answers are "NO", may proceed with cephalosporin use.      Antimicrobials this admission: 6/26 Ceftriaxone >>  6/25 Vancomycin >>  6/24 Metronidazole >> 6/24 Cefepime x1  Dose adjustments this admission:  Microbiology results: 6/24 Wound Cx: MRSA PCR:   Thank you for allowing pharmacy to be a part of this patient's care.  Junita Push PharmD 08/16/2022 10:52 PM

## 2022-08-16 NOTE — Assessment & Plan Note (Signed)
Cont home Hyzaar

## 2022-08-16 NOTE — ED Notes (Signed)
ED TO INPATIENT HANDOFF REPORT  S Name/Age/Gender [name] 67 y.o. female Room/Bed: WOTF/NONE  Code Status   Code Status: Full Code  Home/SNF/Other Patient oriented to: self, place, time, and situation Is this baseline? Yes   Triage Complete: Triage complete  Chief Complaint Diabetic infection of left foot (HCC) [E95.284, L08.9]  Triage Note Patient reports left fourth toe wound. Is diabetic and has toe amputations on the right foot but not the left. Denies any drainage, does endorse low grade fever yesterday and redness on toe. Started on PO abx yesterday from wound care dr.    Allergies Allergies  Allergen Reactions   Penicillins Hives    Did it involve swelling of the face/tongue/throat, SOB, or low BP? No Did it involve sudden or severe rash/hives, skin peeling, or any reaction on the inside of your mouth or nose? Yes Did you need to seek medical attention at a hospital or doctor's office? No When did it last happen?  childhood     If all above answers are "NO", may proceed with cephalosporin use.      Level of Care/Admitting Diagnosis ED Disposition     ED Disposition  Admit   Condition  --   Comment  Hospital Area: [facility] [[id number]]  Level of Care: Med-Surg [16]  May admit patient to [facility] or [facility] if equivalent level of care is available:: No  Covid Evaluation: Asymptomatic - no recent exposure (last 10 days) testing not required  Diagnosis: Diabetic infection of left foot [facility]) [[id number]]  Admitting Physician: Hillary Bow [[id number]]  Attending Physician: Hillary Bow [phone]  Certification:: I certify this patient is being admitted for an inpatient-only procedure  Estimated Length of Stay: 4          B Medical/Surgery History Past Medical History:  Diagnosis Date   Anxiety    Arthritis    knees   Coronary artery disease    Depression    Fatty liver    History of nuclear stress test 12/16/2011    exercise myoview; normal images with 2-32mm ST-segment depression - subsequent cath revelaed subtotally occluded small 2nd marginal branch & 75% PDA lesion, normal LV function   Hypertension    Hypothyroidism    Neuropathy    in feet   OSA on CPAP    AHI = 44 (per patient) setting 13 per pt   Personal history of kidney stones    Primary localized osteoarthritis of left knee 04/24/2015   Primary localized osteoarthritis of right knee 07/02/2015   Tobacco abuse    Type 2 diabetes mellitus (HCC)    insulin pump   Past Surgical History:  Procedure Laterality Date   3rd right toe amputation  02/2016   AMPUTATION Right 12/22/2014   Procedure: RIGHT 2ND TOE AMPUTATION;  Surgeon: Toni Arthurs, MD;  Location: [facility] OR;  Service: Orthopedics;  Laterality: Right;   AUGMENTATION MAMMAPLASTY Bilateral    BREAST ENHANCEMENT SURGERY Bilateral    CARDIAC CATHETERIZATION  01/04/2012   subtotally occluded small 2nd marginal branch & 75% PDA lesion, normal LV function   CATARACT EXTRACTION Bilateral    with lens implants   CHOLECYSTECTOMY  2008   COLONOSCOPY     COLONOSCOPY WITH PROPOFOL N/A 05/12/2016   Procedure: COLONOSCOPY WITH PROPOFOL;  Surgeon: Charna Elizabeth, MD;  Location: [facility];  Service: Endoscopy;  Laterality: N/A;   I & D KNEE WITH POLY EXCHANGE Left 05/13/2015   Procedure: IRRIGATION AND DEBRIDEMENT  KNEE WITH POLY EXCHANGE;  Surgeon: Teryl Lucy, MD;  Location[facility] OR;  Service: Orthopedics;  Laterality: Left;   LEFT HEART CATHETERIZATION WITH CORONARY ANGIOGRAM N/A 01/04/2012   Procedure: LEFT HEART CATHETERIZATION WITH CORONARY ANGIOGRAM;  Surgeon: Runell Gess, MD;  Location: [facility] CATH [facility];  Service: Cardiovascular;  Laterality: N/A;   PARTIAL KNEE ARTHROPLASTY Left 04/24/2015   Procedure: LEFT UNICOMPARTMENTAL KNEE ARTHROPLASTY ;  Surgeon: Teryl Lucy, MD;  Location: [facility];  Service: Orthopedics;  Laterality: Left;   PARTIAL KNEE ARTHROPLASTY Right 07/02/2015    Procedure: RIGHT UNI KNEE ARTHROPLASTY;  Surgeon: Teryl Lucy, MD;  Location: [facility];  Service: Orthopedics;  Laterality: Right;  ANESTHESIA:  GENERAL, PRE/POST OP FEMORAL NERVE   POSTERIOR CERVICAL FUSION/FORAMINOTOMY  1990   ROBOTIC ASSISTED TOTAL HYSTERECTOMY WITH BILATERAL SALPINGO OOPHERECTOMY Bilateral 02/17/2015   Procedure: ROBOTIC ASSISTED TOTAL HYSTERECTOMY WITH BILATERAL SALPINGO OOPHORECTOMY;  Surgeon: Adolphus Birchwood, MD;  Location: [facility]S;  Service: Gynecology;  Laterality: Bilateral;     A IV Location/Drains/Wounds Patient Lines/Drains/Airways Status     Active Line/Drains/Airways     Name Placement date Placement time Site Days   Peripheral IV 08/16/22 20 G Anterior;Proximal;Right Forearm 08/16/22  2252  Forearm  less than 1   Incision - 5 Ports Abdomen 1: Left;Upper 2: Lateral;Left;Lower 3: Left;Medial;Lower 4: Upper;Umbilicus 5: Right;Lateral;Lower 02/17/15  0817  -- 2737   Wound / Incision (Open or Dehisced) 11/13/16 Diabetic ulcer Foot Right right big toe, closed ulcers, pt paints with betadine at home 11/13/16  1000  Foot  2102            Intake/Output Last 24 hours No intake or output data in the 24 hours ending 08/16/22 2347  Labs/Imaging Results for orders placed or performed during the hospital encounter of 08/16/22 (from the past 48 hour(s))  CBG monitoring, ED     Status: Abnormal   Collection Time: 08/16/22  7:41 PM  Result Value Ref Range   Glucose-Capillary 107 (H) 70 - 99 mg/dL    Comment: Glucose reference range applies only to samples taken after fasting for at least 8 hours.   Comment 1 Notify RN    Comment 2 Document in Chart   Lactic acid, plasma     Status: Abnormal   Collection Time: 08/16/22  7:54 PM  Result Value Ref Range   Lactic Acid, Venous 2.0 (HH) 0.5 - 1.9 mmol/L    Comment: CRITICAL RESULT CALLED TO, READ BACK BY AND VERIFIED WITH T.J. RIVERS, RN 08/16/22 2111 BY Kirtland Bouchard DAVIS Performed at [facility], [address], [place], [place] [address]   Comprehensive metabolic panel     Status: Abnormal   Collection Time: 08/16/22  7:54 PM  Result Value Ref Range   Sodium 133 (L) 135 - 145 mmol/L   Potassium 4.7 3.5 - 5.1 mmol/L   Chloride 100 98 - 111 mmol/L   CO2 24 22 - 32 mmol/L   Glucose, Bld 126 (H) 70 - 99 mg/dL    Comment: Glucose reference range applies only to samples taken after fasting for at least 8 hours.   BUN 23 8 - 23 mg/dL   Creatinine, Ser 2.95 (H) 0.44 - 1.00 mg/dL   Calcium 9.6 8.9 - 62.1 mg/dL   Total Protein 7.7 6.5 - 8.1 g/dL   Albumin 3.7 3.5 - 5.0 g/dL   AST 15 15 - 41 U/L   ALT 14 0 - 44 U/L  Alkaline Phosphatase 116 38 - 126 U/L   Total Bilirubin 0.8 0.3 - 1.2 mg/dL   GFR, Estimated 42 (L) >60 mL/min    Comment: (NOTE) Calculated using the CKD-EPI Creatinine Equation (2021)    Anion gap 9 5 - 15    Comment: Performed at [facility], [address] [place], [place] [address]  CBC with Differential     Status: Abnormal   Collection Time: 08/16/22  7:54 PM  Result Value Ref Range   WBC 15.4 (H) 4.0 - 10.5 K/uL   RBC 3.89 3.87 - 5.11 MIL/uL   Hemoglobin 12.7 12.0 - 15.0 g/dL   HCT 91.4 78.2 - 95.6 %   MCV 100.5 (H) 80.0 - 100.0 fL   MCH 32.6 26.0 - 34.0 pg   MCHC 32.5 30.0 - 36.0 g/dL   RDW 21.3 08.6 - 57.8 %   Platelets 223 150 - 400 K/uL   nRBC 0.0 0.0 - 0.2 %   Neutrophils Relative % 74 %   Neutro Abs 11.4 (H) 1.7 - 7.7 K/uL   Lymphocytes Relative 16 %   Lymphs Abs 2.5 0.7 - 4.0 K/uL   Monocytes Relative 7 %   Monocytes Absolute 1.0 0.1 - 1.0 K/uL   Eosinophils Relative 1 %   Eosinophils Absolute 0.2 0.0 - 0.5 K/uL   Basophils Relative 1 %   Basophils Absolute 0.1 0.0 - 0.1 K/uL   Immature Granulocytes 1 %   Abs Immature Granulocytes 0.12 (H) 0.00 - 0.07 K/uL    Comment: Performed at [facility], [address] [place], [place] [address]  Lactic acid, plasma     Status: None   Collection  Time: 08/16/22 10:29 PM  Result Value Ref Range   Lactic Acid, Venous 1.2 0.5 - 1.9 mmol/L    Comment: Performed at [facility], [address] [place], [place] [address]   DG HIP UNILAT WITH PELVIS 2-3 VIEWS LEFT  Result Date: 08/16/2022 CLINICAL DATA:  Left hip pain after fall 2 days ago. EXAM: DG HIP (WITH OR WITHOUT PELVIS) 2-3V LEFT COMPARISON:  None Available. FINDINGS: There is no evidence of hip fracture or dislocation. There is no evidence of arthropathy or other focal bone abnormality. IMPRESSION: Negative. Electronically Signed   By: Lupita Raider M.D.   On: 08/16/2022 15:04    Pending Labs Unresulted Labs (From admission, onward)     Start     Ordered   08/17/22 0500  CBC  Tomorrow morning,   R        08/16/22 2256   08/17/22 0500  Basic metabolic panel  Tomorrow morning,   R        08/16/22 2256   08/16/22 2255  HIV Antibody (routine testing w rflx)  (HIV Antibody (Routine testing w reflex) panel)  Once,   R        08/16/22 2256   08/16/22 2239  MRSA Next Gen by PCR, Nasal  Once,   URGENT        08/16/22 2239   08/16/22 2238  Hemoglobin A1c  Once,   URGENT        08/16/22 2239   08/16/22 2238  Sedimentation rate  Once,   URGENT        08/16/22 2239   08/16/22 2238  C-reactive protein  Once,   URGENT        08/16/22 2239   08/16/22 2238  Prealbumin  Once,   URGENT  08/16/22 2239            Vitals/Pain Today's Vitals   08/16/22 1921 08/16/22 1929 08/16/22 2312  BP: (!) 144/68    Pulse: 82    Resp: 19    Temp: 98 F (36.7 C)  98.3 F (36.8 C)  TempSrc: Oral  Oral  SpO2: 97%    Weight: 124.7 kg    Height: 5\' 7"  (1.702 m)    PainSc:  0-No pain     Isolation Precautions No active isolations  Medications Medications  vancomycin (VANCOREADY) IVPB 2000 mg/400 mL (has no administration in time range)  ceFEPIme (MAXIPIME) 2 g in sodium chloride 0.9 % 100 mL IVPB (0 g Intravenous Paused 08/16/22 2339)  cefTRIAXone (ROCEPHIN) 2 g  in sodium chloride 0.9 % 100 mL IVPB (has no administration in time range)  metroNIDAZOLE (FLAGYL) tablet 500 mg (500 mg Oral Given 08/16/22 2326)  insulin pump (has no administration in time range)  atorvastatin (LIPITOR) tablet 20 mg (has no administration in time range)  buPROPion (WELLBUTRIN XL) 24 hr tablet 300 mg (has no administration in time range)  levothyroxine (SYNTHROID) tablet 137 mcg (has no administration in time range)  DULoxetine (CYMBALTA) DR capsule 60 mg (has no administration in time range)  gabapentin (NEURONTIN) capsule 600 mg (600 mg Oral Given 08/16/22 2325)  enoxaparin (LOVENOX) injection 60 mg (has no administration in time range)  acetaminophen (TYLENOL) tablet 650 mg (has no administration in time range)    Or  acetaminophen (TYLENOL) suppository 650 mg (has no administration in time range)  ondansetron (ZOFRAN) tablet 4 mg (has no administration in time range)    Or  ondansetron (ZOFRAN) injection 4 mg (has no administration in time range)  vancomycin (VANCOREADY) IVPB 1500 mg/300 mL (has no administration in time range)  losartan (COZAAR) tablet 100 mg (has no administration in time range)    And  hydrochlorothiazide (HYDRODIURIL) tablet 12.5 mg (has no administration in time range)    Mobility walks      R Recommendations: See Admitting Provider Note  Report given to: courtney Dareen Piano

## 2022-08-16 NOTE — Assessment & Plan Note (Signed)
BGL looks okay today in ED. Continue insulin pump for now per protocol. CBG checks AC/HS 0300

## 2022-08-17 ENCOUNTER — Inpatient Hospital Stay (HOSPITAL_COMMUNITY): Payer: Medicare Other

## 2022-08-17 DIAGNOSIS — E11628 Type 2 diabetes mellitus with other skin complications: Secondary | ICD-10-CM | POA: Diagnosis not present

## 2022-08-17 DIAGNOSIS — N289 Disorder of kidney and ureter, unspecified: Secondary | ICD-10-CM

## 2022-08-17 DIAGNOSIS — L089 Local infection of the skin and subcutaneous tissue, unspecified: Secondary | ICD-10-CM | POA: Diagnosis not present

## 2022-08-17 DIAGNOSIS — L97509 Non-pressure chronic ulcer of other part of unspecified foot with unspecified severity: Secondary | ICD-10-CM | POA: Diagnosis not present

## 2022-08-17 LAB — BASIC METABOLIC PANEL
Anion gap: 9 (ref 5–15)
BUN: 26 mg/dL — ABNORMAL HIGH (ref 8–23)
CO2: 22 mmol/L (ref 22–32)
Calcium: 9 mg/dL (ref 8.9–10.3)
Chloride: 101 mmol/L (ref 98–111)
Creatinine, Ser: 1.51 mg/dL — ABNORMAL HIGH (ref 0.44–1.00)
GFR, Estimated: 37 mL/min — ABNORMAL LOW (ref >60)
Glucose, Bld: 159 mg/dL — ABNORMAL HIGH (ref 70–99)
Potassium: 3.8 mmol/L (ref 3.5–5.1)
Sodium: 132 mmol/L — ABNORMAL LOW (ref 135–145)

## 2022-08-17 LAB — GLUCOSE, CAPILLARY
Glucose-Capillary: 174 mg/dL — ABNORMAL HIGH (ref 70–99)
Glucose-Capillary: 133 mg/dL — ABNORMAL HIGH (ref 70–99)
Glucose-Capillary: 65 mg/dL — ABNORMAL LOW (ref 70–99)
Glucose-Capillary: 141 mg/dL — ABNORMAL HIGH (ref 70–99)
Glucose-Capillary: 151 mg/dL — ABNORMAL HIGH (ref 70–99)

## 2022-08-17 LAB — MRSA NEXT GEN BY PCR, NASAL: MRSA by PCR Next Gen: NOT DETECTED

## 2022-08-17 LAB — HIV ANTIBODY (ROUTINE TESTING W REFLEX): HIV Screen 4th Generation wRfx: NONREACTIVE

## 2022-08-17 LAB — WOUND CULTURE
MICRO NUMBER:: 15123364
SPECIMEN QUALITY:: ADEQUATE

## 2022-08-17 LAB — SEDIMENTATION RATE: Sed Rate: 51 mm/hr — ABNORMAL HIGH (ref 0–22)

## 2022-08-17 LAB — CBC
HCT: 39.2 % (ref 36.0–46.0)
Hemoglobin: 12.9 g/dL (ref 12.0–15.0)
MCH: 32.8 pg (ref 26.0–34.0)
MCHC: 32.9 g/dL (ref 30.0–36.0)
MCV: 99.7 fL (ref 80.0–100.0)
Platelets: 202 10*3/uL (ref 150–400)
RBC: 3.93 MIL/uL (ref 3.87–5.11)
RDW: 13.3 % (ref 11.5–15.5)
WBC: 13.6 10*3/uL — ABNORMAL HIGH (ref 4.0–10.5)
nRBC: 0 % (ref 0.0–0.2)

## 2022-08-17 LAB — TSH: TSH: 3.708 u[IU]/mL (ref 0.350–4.500)

## 2022-08-17 LAB — C-REACTIVE PROTEIN: CRP: 6.7 mg/dL — ABNORMAL HIGH (ref <1.0)

## 2022-08-17 LAB — PREALBUMIN: Prealbumin: 19 mg/dL (ref 18–38)

## 2022-08-17 MED ORDER — VANCOMYCIN HCL 1500 MG/300ML IV SOLN: 1500.0000 mg | INTRAVENOUS | Status: DC

## 2022-08-17 MED ORDER — JUVEN PO PACK
1.0000 | PACK | Freq: Two times a day (BID) | ORAL | Status: DC
  Administered 2022-08-17: 1 via ORAL
  Filled 2022-08-17 (×4): qty 1

## 2022-08-17 MED ORDER — PNEUMOCOCCAL 20-VAL CONJ VACC 0.5 ML IM SUSY: 0.5000 mL | PREFILLED_SYRINGE | INTRAMUSCULAR | Status: DC | PRN

## 2022-08-17 MED ORDER — DIPHENHYDRAMINE HCL 25 MG PO CAPS
25.0000 mg | ORAL_CAPSULE | Freq: Once | ORAL | Status: DC | PRN
  Filled 2022-08-17: qty 1

## 2022-08-17 MED ORDER — SODIUM CHLORIDE 0.9 % IV SOLN: INTRAVENOUS | Status: DC | PRN

## 2022-08-17 NOTE — ED Provider Notes (Signed)
[facility] Provider Note  CSN: [id number] Arrival date & time: 08/16/22 1859  Chief Complaint(s) Wound Check  HPI [name] is a 67 y.o. female with PMH HTN, T2DM, hypothyroidism who presents emergency department for evaluation of a wound check.  Patient states that for the last 2 weeks she has had progressive worsening ulcer of the left toe and now has erythema streaking up the dorsal midfoot and into the shin on the left.  Saw Dr. Logan Bores of podiatry yesterday and patient started on oral antibiotics yesterday as she declined to come to the ER.  Symptoms worsened and she decided to come to the ER today.  Denies associated fever, chest pain, shortness of breath, Donnell pain, nausea, vomiting or other systemic symptoms.   Past Medical History Past Medical History:  Diagnosis Date   Anxiety    Arthritis    knees   Coronary artery disease    Depression    Fatty liver    History of nuclear stress test 12/16/2011   exercise myoview; normal images with 2-17mm ST-segment depression - subsequent cath revelaed subtotally occluded small 2nd marginal branch & 75% PDA lesion, normal LV function   Hypertension    Hypothyroidism    Neuropathy    in feet   OSA on CPAP    AHI = 44 (per patient) setting 13 per pt   Personal history of kidney stones    Primary localized osteoarthritis of left knee 04/24/2015   Primary localized osteoarthritis of right knee 07/02/2015   Tobacco abuse    Type 2 diabetes mellitus (HCC)    insulin pump   Patient Active Problem List   Diagnosis Date Noted   Renal insufficiency 08/17/2022   Diabetic infection of left foot (HCC) 08/16/2022   Vitreomacular adhesion of both eyes 09/02/2021   Hypertensive retinopathy of both eyes, grade 2 09/02/2021   Severe nonproliferative diabetic retinopathy of left eye (HCC) 04/22/2021   Severe nonproliferative diabetic retinopathy of right eye, with macular edema, associated with type 2 diabetes mellitus  (HCC) 04/22/2021   Abnormal gait 11/04/2020   Acute stress reaction 11/04/2020   Osteoporosis 02/18/2020   Fatigue 06/24/2019   Abnormal levels of other serum enzymes 09/25/2017   Absence of toe (HCC) 09/06/2017   Hypertrophic condition of skin 09/06/2017   Long term (current) use of insulin (HCC) 12/22/2016   Diarrhea 11/12/2016   AKI (acute kidney injury) (HCC) 11/12/2016   Abnormal urine 10/14/2016   Sepsis (HCC) 10/06/2016   Diabetes mellitus due to underlying condition, uncontrolled 10/06/2016   Encounter for general adult medical examination without abnormal findings 08/18/2015   Primary localized osteoarthritis of right knee 07/02/2015   S/P right unicompartmental knee replacement 07/02/2015   Infection of prosthetic left knee joint (HCC) 05/13/2015   Primary localized osteoarthritis of left knee 04/24/2015   S/P left unicompartmental knee replacement 04/24/2015   Hyperlipidemia 04/03/2015   Pelvic mass in female 02/17/2015   Mass, ovarian 02/09/2015   Hereditary and idiopathic neuropathy, unspecified 11/20/2014   Callosity 12/02/2013   CAD (coronary artery disease) 11/05/2012   Obesity 11/05/2012   Tobacco abuse 11/05/2012   OSA on CPAP 11/05/2012   HTN (hypertension) 11/05/2012   DM (diabetes mellitus) type 2, uncontrolled, with ketoacidosis (HCC) 11/05/2012   Diabetic peripheral neuropathy associated with type 2 diabetes mellitus (HCC) 01/06/2011   Hyperglycemia due to type 2 diabetes mellitus (HCC) 01/06/2011   Disorder of lung 06/25/2010   Hypothyroidism 10/17/2008   Major depression, single  episode 10/17/2008   Polyneuropathy 10/17/2008   Home Medication(s) Prior to Admission medications   Medication Sig Start Date End Date Taking? Authorizing Provider  atorvastatin (LIPITOR) 20 MG tablet Take 20 mg by mouth daily. 10/12/14  Yes [provider]  buPROPion (WELLBUTRIN XL) 300 MG 24 hr tablet Take 300 mg by mouth daily.   Yes [provider]   ciprofloxacin (CIPRO) 500 MG tablet Take 1 tablet (500 mg total) by mouth 2 (two) times daily for 10 days. 08/15/22 08/25/22 Yes Felecia Shelling, DPM  DULoxetine (CYMBALTA) 60 MG capsule Take 60 mg by mouth daily. 06/25/21  Yes [provider]  gabapentin (NEURONTIN) 300 MG capsule Take 600 mg by mouth 3 (three) times daily.  10/12/14  Yes [provider]  Insulin Human (INSULIN PUMP) SOLN Inject 1 each into the skin continuous. Humalog - basal rate 3.2 Units/hr Patient taking differently: Inject 1 each into the skin continuous. Novolog - basal rate 3.2 units/hr 10/08/16  Yes Glade Lloyd, MD  levothyroxine (SYNTHROID, LEVOTHROID) 137 MCG tablet Take 137 mcg by mouth daily before breakfast. 09/13/17  Yes [provider]  losartan-hydrochlorothiazide (HYZAAR) 100-12.5 MG tablet Take 1 tablet by mouth daily. 10/27/16  Yes [provider]  sulfamethoxazole-trimethoprim (BACTRIM DS) 800-160 MG tablet Take 1 tablet by mouth 2 (two) times daily. 08/15/22  Yes Felecia Shelling, DPM  Continuous Blood Gluc Receiver (FREESTYLE LIBRE 14 DAY READER) DEVI Use to monitor blood glucose 12/22/16   [provider]  Continuous Blood Gluc Sensor (FREESTYLE LIBRE 14 DAY SENSOR) MISC Use to monitor blood glucose continuously-Dx code E11.29 12/22/16   [provider]  OVER THE COUNTER MEDICATION Pt uses CPAP machine daily.    [provider]                                                                                                                                    Past Surgical History Past Surgical History:  Procedure Laterality Date   3rd right toe amputation  02/2016   AMPUTATION Right 12/22/2014   Procedure: RIGHT 2ND TOE AMPUTATION;  Surgeon: Toni Arthurs, MD;  Location: [facility] OR;  Service: Orthopedics;  Laterality: Right;   AUGMENTATION MAMMAPLASTY Bilateral    BREAST ENHANCEMENT SURGERY Bilateral    CARDIAC CATHETERIZATION  01/04/2012   subtotally occluded  small 2nd marginal branch & 75% PDA lesion, normal LV function   CATARACT EXTRACTION Bilateral    with lens implants   CHOLECYSTECTOMY  2008   COLONOSCOPY     COLONOSCOPY WITH PROPOFOL N/A 05/12/2016   Procedure: COLONOSCOPY WITH PROPOFOL;  Surgeon: Charna Elizabeth, MD;  Location: [facility];  Service: Endoscopy;  Laterality: N/A;   I & D KNEE WITH POLY EXCHANGE Left 05/13/2015   Procedure: IRRIGATION AND DEBRIDEMENT KNEE WITH POLY EXCHANGE;  Surgeon: Teryl Lucy, MD;  Location: [facility] OR;  Service: Orthopedics;  Laterality: Left;   LEFT HEART CATHETERIZATION  WITH CORONARY ANGIOGRAM N/A 01/04/2012   Procedure: LEFT HEART CATHETERIZATION WITH CORONARY ANGIOGRAM;  Surgeon: Runell Gess, MD;  Location: [facility] CATH LAB;  Service: Cardiovascular;  Laterality: N/A;   PARTIAL KNEE ARTHROPLASTY Left 04/24/2015   Procedure: LEFT UNICOMPARTMENTAL KNEE ARTHROPLASTY ;  Surgeon: Teryl Lucy, MD;  Location: [facility];  Service: Orthopedics;  Laterality: Left;   PARTIAL KNEE ARTHROPLASTY Right 07/02/2015   Procedure: RIGHT UNI KNEE ARTHROPLASTY;  Surgeon: Teryl Lucy, MD;  Location: [facility];  Service: Orthopedics;  Laterality: Right;  ANESTHESIA:  GENERAL, PRE/POST OP FEMORAL NERVE   POSTERIOR CERVICAL FUSION/FORAMINOTOMY  1990   ROBOTIC ASSISTED TOTAL HYSTERECTOMY WITH BILATERAL SALPINGO OOPHERECTOMY Bilateral 02/17/2015   Procedure: ROBOTIC ASSISTED TOTAL HYSTERECTOMY WITH BILATERAL SALPINGO OOPHORECTOMY;  Surgeon: Adolphus Birchwood, MD;  Location: [facility];  Service: Gynecology;  Laterality: Bilateral;   Family History Family History  Problem Relation Age of Onset   Stroke Mother    Hypertension Mother    Diabetes Mother    Alzheimer's disease Mother    Diabetes Father    COPD Father        vent-dependent, MODS   Hypertension Sister    Mental illness Sister        borderline personality d/o   Mental illness Sister        schizoeffective d/o   Diabetes Sister    Sleep apnea  Neg Hx     Social History Social History   Tobacco Use   Smoking status: Every Day    Packs/day: 1.00    Years: 25.00    Additional pack years: 0.00    Total pack years: 25.00    Types: Cigarettes   Smokeless tobacco: Never   Tobacco comments:    quit April 2021  Vaping Use   Vaping Use: Never used  Substance Use Topics   Alcohol use: No    Alcohol/week: 0.0 standard drinks of alcohol   Drug use: No   Allergies Penicillins  Review of Systems Review of Systems  Skin:  Positive for wound.    Physical Exam Vital Signs  I have reviewed the triage vital signs BP (!) 121/55 (BP Location: Left Arm)   Pulse 75   Temp 97.7 F (36.5 C) (Oral)   Resp 19   Ht 5\' 7"  (1.702 m)   Wt 124.7 kg   SpO2 97%   BMI 43.07 kg/m   Physical Exam Vitals and nursing note reviewed.  Constitutional:      General: She is not in acute distress.    Appearance: She is well-developed.  HENT:     Head: Normocephalic and atraumatic.  Eyes:     Conjunctiva/sclera: Conjunctivae normal.  Cardiovascular:     Rate and Rhythm: Normal rate and regular rhythm.     Heart sounds: No murmur heard. Pulmonary:     Effort: Pulmonary effort is normal. No respiratory distress.  Musculoskeletal:        General: No swelling.     Cervical back: Neck supple.  Skin:    General: Skin is warm and dry.     Capillary Refill: Capillary refill takes less than 2 seconds.     Findings: Lesion present.  Neurological:     Mental Status: She is alert.  Psychiatric:        Mood and Affect: Mood normal.        ED Results and Treatments Labs (all labs ordered are listed, but only abnormal results are displayed) Labs Reviewed  LACTIC ACID, PLASMA - Abnormal; Notable for the following components:      Result Value   Lactic Acid, Venous 2.0 (*)    All other components within normal limits  COMPREHENSIVE METABOLIC PANEL - Abnormal; Notable for the following components:   Sodium 133 (*)    Glucose, Bld 126  (*)    Creatinine, Ser 1.37 (*)    GFR, Estimated 42 (*)    All other components within normal limits  CBC WITH DIFFERENTIAL/PLATELET - Abnormal; Notable for the following components:   WBC 15.4 (*)    MCV 100.5 (*)    Neutro Abs 11.4 (*)    Abs Immature Granulocytes 0.12 (*)    All other components within normal limits  SEDIMENTATION RATE - Abnormal; Notable for the following components:   Sed Rate 51 (*)    All other components within normal limits  C-REACTIVE PROTEIN - Abnormal; Notable for the following components:   CRP 6.7 (*)    All other components within normal limits  CBC - Abnormal; Notable for the following components:   WBC 13.6 (*)    All other components within normal limits  BASIC METABOLIC PANEL - Abnormal; Notable for the following components:   Sodium 132 (*)    Glucose, Bld 159 (*)    BUN 26 (*)    Creatinine, Ser 1.51 (*)    GFR, Estimated 37 (*)    All other components within normal limits  GLUCOSE, CAPILLARY - Abnormal; Notable for the following components:   Glucose-Capillary 174 (*)    All other components within normal limits  GLUCOSE, CAPILLARY - Abnormal; Notable for the following components:   Glucose-Capillary 133 (*)    All other components within normal limits  GLUCOSE, CAPILLARY - Abnormal; Notable for the following components:   Glucose-Capillary 65 (*)    All other components within normal limits  CBG MONITORING, ED - Abnormal; Notable for the following components:   Glucose-Capillary 107 (*)    All other components within normal limits  MRSA NEXT GEN BY PCR, NASAL  LACTIC ACID, PLASMA  PREALBUMIN  HIV ANTIBODY (ROUTINE TESTING W REFLEX)  HEMOGLOBIN A1C                                                                                                                          Radiology VAS [place] ABI WITH/WO TBI  Result Date: 08/17/2022  LOWER EXTREMITY DOPPLER STUDY Patient Name:  KYRAN WHITTIER  Date of Exam:   08/17/2022 Medical Rec #:  [id number]           Accession #:    [id number] Date of Birth: 12/29/1955          Patient Gender: F Patient Age:   37 years Exam Location:  [facility] Procedure:      VAS [place] ABI WITH/WO TBI Referring Phys: Lyda Perone --------------------------------------------------------------------------------  Indications: Ulceration. High Risk Factors: Hypertension, hyperlipidemia, Diabetes, current smoker,  coronary artery disease. Other Factors: HX of 2nd and 3rd digit amputation of right foot.  Comparison Study: No previous exams Performing Technologist: Hill, Jody RVT, RDMS  Examination Guidelines: A complete evaluation includes at minimum, Doppler waveform signals and systolic blood pressure reading at the level of bilateral brachial, anterior tibial, and posterior tibial arteries, when vessel segments are accessible. Bilateral testing is considered an integral part of a complete examination. Photoelectric Plethysmograph (PPG) waveforms and toe systolic pressure readings are included as required and additional duplex testing as needed. Limited examinations for reoccurring indications may be performed as noted.  ABI Findings: +---------+------------------+-----+---------+--------+ Right    Rt Pressure (mmHg)IndexWaveform Comment  +---------+------------------+-----+---------+--------+ Brachial 133                    triphasic         +---------+------------------+-----+---------+--------+ PTA      144               1.08 biphasic          +---------+------------------+-----+---------+--------+ DP       124               0.93 biphasic          +---------+------------------+-----+---------+--------+ Daleen Squibb               1.03 Normal            +---------+------------------+-----+---------+--------+ +---------+------------------+-----+---------+-------+ Left     Lt Pressure (mmHg)IndexWaveform Comment +---------+------------------+-----+---------+-------+  Brachial 127                    triphasic        +---------+------------------+-----+---------+-------+ PTA      123               0.92 biphasic         +---------+------------------+-----+---------+-------+ DP       120               0.90 biphasic         +---------+------------------+-----+---------+-------+ Great Toe78                0.59 Abnormal         +---------+------------------+-----+---------+-------+  Summary: Right: Resting right ankle-brachial index is within normal range. The right toe-brachial index is normal. Left: Resting left ankle-brachial index indicates mild left lower extremity arterial disease. The left toe-brachial index is abnormal. *See table(s) above for measurements and observations.     Preliminary    MR FOOT LEFT W WO CONTRAST  Result Date: 08/17/2022 CLINICAL DATA:  Osteomyelitis, foot. Type 2 diabetes. EPIC notes indicate ulcer of great toe, infected for the past 12 days or so. EXAM: MRI OF THE LEFT FOREFOOT WITHOUT AND WITH CONTRAST TECHNIQUE: Multiplanar, multisequence MR imaging of the left forefoot was performed both before and after administration of intravenous contrast. CONTRAST:  10mL GADAVIST GADOBUTROL 1 MMOL/ML IV SOLN COMPARISON:  Outside radiographs 08/15/2022. No other comparison studies available. FINDINGS: Bones/Joint/Cartilage There are clawtoe deformities, limiting assessment of the toes. Within the distal 1st phalanx, there is increased T2 marrow signal and low level enhancement. No cortical destruction or T1 signal abnormality is identified. If the reported skin ulcer is in this location, these findings are suspicious for early osteomyelitis. No other suspicious osseous findings are identified. There are mild degenerative changes at the 1st metatarsophalangeal joint. There are advanced midfoot degenerative changes, primarily at the Lisfranc and naviculocuneiform joints. Ligaments The Lisfranc ligament appears intact. The collateral  ligaments  of the metatarsophalangeal joints appear intact. Muscles and Tendons Generalized forefoot muscular atrophy without intramuscular fluid collection or suspicious enhancement. The foot tendons appear intact, without significant tenosynovitis. Soft tissues No obvious skin ulceration identified. There is some soft tissue enhancement in the distal aspects of the great toe and 4th toe without focal fluid collection. There is also mild nonspecific dorsal forefoot subcutaneous edema and enhancement. Relatively decreased enhancement is noted within the soft tissues of the other toes, suggesting poor vascularity and possible early soft tissue devitalization. IMPRESSION: 1. Nonspecific marrow edema and low level enhancement within the distal 1st phalanx, suspicious for early osteomyelitis in the appropriate clinical context. No cortical destruction or T1 signal abnormality identified. 2. No evidence of soft tissue abscess. 3. Advanced midfoot degenerative changes. 4. Generalized forefoot muscular atrophy. Relatively decreased enhancement within the soft tissues of the other toes, suggesting poor vascularity and possible early soft tissue devitalization. Electronically Signed   By: Carey Bullocks M.D.   On: 08/17/2022 08:28   DG HIP UNILAT WITH PELVIS 2-3 VIEWS LEFT  Result Date: 08/16/2022 CLINICAL DATA:  Left hip pain after fall 2 days ago. EXAM: DG HIP (WITH OR WITHOUT PELVIS) 2-3V LEFT COMPARISON:  None Available. FINDINGS: There is no evidence of hip fracture or dislocation. There is no evidence of arthropathy or other focal bone abnormality. IMPRESSION: Negative. Electronically Signed   By: Lupita Raider M.D.   On: 08/16/2022 15:04    Pertinent labs & imaging results that were available during my care of the patient were reviewed by me and considered in my medical decision making (see MDM for details).  Medications Ordered in ED Medications  cefTRIAXone (ROCEPHIN) 2 g in sodium chloride 0.9 % 100 mL  IVPB (2 g Intravenous New Bag/Given 08/17/22 0834)  metroNIDAZOLE (FLAGYL) tablet 500 mg (500 mg Oral Given 08/17/22 0830)  insulin pump ( Subcutaneous Not Given 08/17/22 1129)  atorvastatin (LIPITOR) tablet 20 mg (20 mg Oral Given 08/17/22 0830)  buPROPion (WELLBUTRIN XL) 24 hr tablet 300 mg (300 mg Oral Given 08/17/22 0833)  levothyroxine (SYNTHROID) tablet 137 mcg (137 mcg Oral Given 08/17/22 0516)  DULoxetine (CYMBALTA) DR capsule 60 mg (60 mg Oral Given 08/17/22 0832)  gabapentin (NEURONTIN) capsule 600 mg (600 mg Oral Given 08/17/22 0831)  enoxaparin (LOVENOX) injection 60 mg (60 mg Subcutaneous Given 08/17/22 0828)  acetaminophen (TYLENOL) tablet 650 mg (650 mg Oral Given 08/17/22 0054)    Or  acetaminophen (TYLENOL) suppository 650 mg ( Rectal See Alternative 08/17/22 0054)  ondansetron (ZOFRAN) tablet 4 mg (has no administration in time range)    Or  ondansetron (ZOFRAN) injection 4 mg (has no administration in time range)  losartan (COZAAR) tablet 100 mg (100 mg Oral Given 08/17/22 0831)    And  hydrochlorothiazide (HYDRODIURIL) tablet 12.5 mg (12.5 mg Oral Given 08/17/22 0829)  0.9 %  sodium chloride infusion ( Intravenous New Bag/Given 08/17/22 0044)  pneumococcal 20-valent conjugate vaccine (PREVNAR 20) injection 0.5 mL (has no administration in time range)  diphenhydrAMINE (BENADRYL) capsule 25 mg (has no administration in time range)  vancomycin (VANCOREADY) IVPB 1500 mg/300 mL (has no administration in time range)  nutrition supplement (JUVEN) (JUVEN) powder packet 1 packet (has no administration in time range)  vancomycin (VANCOREADY) IVPB 2000 mg/400 mL (2,000 mg Intravenous New Bag/Given 08/17/22 0152)  gadobutrol (GADAVIST) 1 MMOL/ML injection 10 mL (10 mLs Intravenous Contrast Given 08/17/22 0013)  Procedures Procedures  (including critical care  time)  Medical Decision Making / ED Course   This patient presents to the ED for concern of foot wound, this involves an extensive number of treatment options, and is a complaint that carries with it a high risk of complications and morbidity.  The differential diagnosis includes diabetic foot ulcer, arterial disease, osteomyelitis, cellulitis  MDM: Patient seen emerged for evaluation of foot wound.  Physical exam with ulcers to multiple toes on the left with erythema extending into the midfoot and up into the shin.  Laboratory evaluation with leukocytosis to 15.4, creatinine 1.37, sed rate 51, CRP 6.7.  Broad-spectrum antibiotics initiated and MRI foot is ordered.  Patient require hospital admission for suspected osteomyelitis and associated cellulitis and a diabetic foot wound.  Patient then admitted.   Additional history obtained: -Additional history obtained from husband -External records from outside source obtained and reviewed including: Chart review including previous notes, labs, imaging, consultation notes   Lab Tests: -I ordered, reviewed, and interpreted labs.   The pertinent results include:   Labs Reviewed  LACTIC ACID, PLASMA - Abnormal; Notable for the following components:      Result Value   Lactic Acid, Venous 2.0 (*)    All other components within normal limits  COMPREHENSIVE METABOLIC PANEL - Abnormal; Notable for the following components:   Sodium 133 (*)    Glucose, Bld 126 (*)    Creatinine, Ser 1.37 (*)    GFR, Estimated 42 (*)    All other components within normal limits  CBC WITH DIFFERENTIAL/PLATELET - Abnormal; Notable for the following components:   WBC 15.4 (*)    MCV 100.5 (*)    Neutro Abs 11.4 (*)    Abs Immature Granulocytes 0.12 (*)    All other components within normal limits  SEDIMENTATION RATE - Abnormal; Notable for the following components:   Sed Rate 51 (*)    All other components within normal limits  C-REACTIVE PROTEIN - Abnormal;  Notable for the following components:   CRP 6.7 (*)    All other components within normal limits  CBC - Abnormal; Notable for the following components:   WBC 13.6 (*)    All other components within normal limits  BASIC METABOLIC PANEL - Abnormal; Notable for the following components:   Sodium 132 (*)    Glucose, Bld 159 (*)    BUN 26 (*)    Creatinine, Ser 1.51 (*)    GFR, Estimated 37 (*)    All other components within normal limits  GLUCOSE, CAPILLARY - Abnormal; Notable for the following components:   Glucose-Capillary 174 (*)    All other components within normal limits  GLUCOSE, CAPILLARY - Abnormal; Notable for the following components:   Glucose-Capillary 133 (*)    All other components within normal limits  GLUCOSE, CAPILLARY - Abnormal; Notable for the following components:   Glucose-Capillary 65 (*)    All other components within normal limits  CBG MONITORING, ED - Abnormal; Notable for the following components:   Glucose-Capillary 107 (*)    All other components within normal limits  MRSA NEXT GEN BY PCR, NASAL  LACTIC ACID, PLASMA  PREALBUMIN  HIV ANTIBODY (ROUTINE TESTING W REFLEX)  HEMOGLOBIN A1C         Imaging Studies ordered: I ordered imaging studies including MRI foot I independently visualized and interpreted imaging. I agree with the radiologist interpretation   Medicines ordered and prescription drug management: Meds ordered this encounter  Medications   vancomycin (VANCOREADY) IVPB 2000 mg/400 mL    Order Specific Question:   Indication:    Answer:   Osteomyelitis   DISCONTD: ceFEPIme (MAXIPIME) 2 g in sodium chloride 0.9 % 100 mL IVPB    Order Specific Question:   Antibiotic Indication:    Answer:   Osteomyelitis   cefTRIAXone (ROCEPHIN) 2 g in sodium chloride 0.9 % 100 mL IVPB    Order Specific Question:   Antibiotic Indication:    Answer:   Other Indication (list below)    Order Specific Question:   Other Indication:    Answer:    Suspected diabetic foot infection.   metroNIDAZOLE (FLAGYL) tablet 500 mg   insulin pump   atorvastatin (LIPITOR) tablet 20 mg   buPROPion (WELLBUTRIN XL) 24 hr tablet 300 mg   levothyroxine (SYNTHROID) tablet 137 mcg   DULoxetine (CYMBALTA) DR capsule 60 mg   gabapentin (NEURONTIN) capsule 600 mg   enoxaparin (LOVENOX) injection 60 mg   OR Linked Order Group    acetaminophen (TYLENOL) tablet 650 mg    acetaminophen (TYLENOL) suppository 650 mg   OR Linked Order Group    ondansetron (ZOFRAN) tablet 4 mg    ondansetron (ZOFRAN) injection 4 mg   DISCONTD: vancomycin (VANCOREADY) IVPB 1500 mg/300 mL    Order Specific Question:   Indication:    Answer:   Wound Infection   DISCONTD: losartan-hydrochlorothiazide (HYZAAR) 100-12.5 MG per tablet 1 tablet   AND Linked Order Group    losartan (COZAAR) tablet 100 mg    hydrochlorothiazide (HYDRODIURIL) tablet 12.5 mg   gadobutrol (GADAVIST) 1 MMOL/ML injection 10 mL   0.9 %  sodium chloride infusion    Carrier Fluid Protocol   pneumococcal 20-valent conjugate vaccine (PREVNAR 20) injection 0.5 mL   diphenhydrAMINE (BENADRYL) capsule 25 mg   vancomycin (VANCOREADY) IVPB 1500 mg/300 mL    Order Specific Question:   Indication:    Answer:   Wound Infection   nutrition supplement (JUVEN) (JUVEN) powder packet 1 packet    -I have reviewed the patients home medicines and have made adjustments as needed  Critical interventions none    Cardiac Monitoring: The patient was maintained on a cardiac monitor.  I personally viewed and interpreted the cardiac monitored which showed an underlying rhythm of: NSR  Social Determinants of Health:  Factors impacting patients care include: none   Reevaluation: After the interventions noted above, I reevaluated the patient and found that they have :improved  Co morbidities that complicate the patient evaluation  Past Medical History:  Diagnosis Date   Anxiety    Arthritis    knees   Coronary  artery disease    Depression    Fatty liver    History of nuclear stress test 12/16/2011   exercise myoview; normal images with 2-24mm ST-segment depression - subsequent cath revelaed subtotally occluded small 2nd marginal branch & 75% PDA lesion, normal LV function   Hypertension    Hypothyroidism    Neuropathy    in feet   OSA on CPAP    AHI = 44 (per patient) setting 13 per pt   Personal history of kidney stones    Primary localized osteoarthritis of left knee 04/24/2015   Primary localized osteoarthritis of right knee 07/02/2015   Tobacco abuse    Type 2 diabetes mellitus (HCC)    insulin pump      Dispostion: I considered admission for this patient, and due to suspected diabetic foot wound with  associated cellulitis patient require hospital admission     Final Clinical Impression(s) / ED Diagnoses Final diagnoses:  Diabetic foot infection [facility])     @PCDICTATION @    Glendora Score, MD 08/17/22 1338

## 2022-08-17 NOTE — Plan of Care (Signed)
?  Problem: Activity: ?Goal: Risk for activity intolerance will decrease ?Outcome: Progressing ?  ?Problem: Safety: ?Goal: Ability to remain free from injury will improve ?Outcome: Progressing ?  ?Problem: Pain Managment: ?Goal: General experience of comfort will improve ?Outcome: Progressing ?  ?

## 2022-08-17 NOTE — Discharge Instructions (Addendum)
Plate Method for Diabetes   Foods with carbohydrates make your blood glucose level go up. The plate method is a simple way to meal plan and control the amount of carbohydrate you eat.         Use the following guidance to build a healthy plate to control carbohydrates. Divide a 9-inch plate into 3 sections, and consider your beverage the 4th section of your meal: Food Group Examples of Foods/Beverages for This Section of your Meal  Section 1: Non-starchy vegetables Fill  of your plate to include non-starchy vegetables Asparagus, broccoli, brussels sprouts, cabbage, carrots, cauliflower, celery, cucumber, green beans, mushrooms, peppers, salad greens, tomatoes, or zucchini.  Section 2: Protein foods Fill  of your plate to include a lean protein Lean meat, poultry, fish, seafood, cheese, eggs, lean deli meat, tofu, beans, lentils, nuts or nut butters.  Section 3: Carbohydrate foods Fill  of your plate to include carbohydrate foods Whole grains, whole wheat bread, brown rice, whole grain pasta, polenta, corn tortillas, fruit, or starchy vegetables (potatoes, green peas, corn, beans, acorn squash, and butternut squash). One cup of milk also counts as a food that contains carbohydrate.  Section 4: Beverage Choose water or a low-calorie drink for your beverage. Unsweetened tea, coffee, or flavored/sparkling water without added sugar.  Image reprinted with permission from The American Diabetes Association.  Copyright 2022 by the American Diabetes Association.   Copyright 2022  Academy of Nutrition and Dietetics. All rights reserved    Carbohydrate Counting For People With Diabetes  Foods with carbohydrates make your blood glucose level go up. Learning how to count carbohydrates can help you control your blood glucose levels. First, identify the foods you eat that contain carbohydrates. Then, using the Foods with Carbohydrates chart, determine about how much carbohydrates are in your meals and  snacks. Make sure you are eating foods with fiber, protein, and healthy fat along with your carbohydrate foods. Foods with Carbohydrates The following table shows carbohydrate foods that have about 15 grams of carbohydrate each. Using measuring cups, spoons, or a food scale when you first begin learning about carbohydrate counting can help you learn about the portion sizes you typically eat. The following foods have 15 grams carbohydrate each:  Grains 1 slice bread (1 ounce)  1 small tortilla (6-inch size)   large bagel (1 ounce)  1/3 cup pasta or rice (cooked)   hamburger or hot dog bun ( ounce)   cup cooked cereal   to  cup ready-to-eat cereal  2 taco shells (5-inch size) Fruit 1 small fresh fruit ( to 1 cup)   medium banana  17 small grapes (3 ounces)  1 cup melon or berries   cup canned or frozen fruit  2 tablespoons dried fruit (blueberries, cherries, cranberries, raisins)   cup unsweetened fruit juice  Starchy Vegetables  cup cooked beans, peas, corn, potatoes/sweet potatoes   large baked potato (3 ounces)  1 cup acorn or butternut squash  Snack Foods 3 to 6 crackers  8 potato chips or 13 tortilla chips ( ounce to 1 ounce)  3 cups popped popcorn  Dairy 3/4 cup (6 ounces) nonfat plain yogurt, or yogurt with sugar-free sweetener  1 cup milk  1 cup plain rice, soy, coconut or flavored almond milk Sweets and Desserts  cup ice cream or frozen yogurt  1 tablespoon jam, jelly, pancake syrup, table sugar, or honey  2 tablespoons light pancake syrup  1 inch square of frosted cake or 2 inch square of   unfrosted cake  2 small cookies (2/3 ounce each) or  large cookie  Sometimes you'll have to estimate carbohydrate amounts if you don't know the exact recipe. One cup of mixed foods like soups can have 1 to 2 carbohydrate servings, while some casseroles might have 2 or more servings of carbohydrate. Foods that have less than 20 calories in each serving can be counted as  "free" foods. Count 1 cup raw vegetables, or  cup cooked non-starchy vegetables as "free" foods. If you eat 3 or more servings at one meal, then count them as 1 carbohydrate serving.  Foods without Carbohydrates  Not all foods contain carbohydrates. Meat, some dairy, fats, non-starchy vegetables, and many beverages don't contain carbohydrate. So when you count carbohydrates, you can generally exclude chicken, pork, beef, fish, seafood, eggs, tofu, cheese, butter, sour cream, avocado, nuts, seeds, olives, mayonnaise, water, black coffee, unsweetened tea, and zero-calorie drinks. Vegetables with no or low carbohydrate include green beans, cauliflower, tomatoes, and onions. How much carbohydrate should I eat at each meal?  Carbohydrate counting can help you plan your meals and manage your weight. Following are some starting points for carbohydrate intake at each meal. Work with your registered dietitian nutritionist to find the best range that works for your blood glucose and weight.   To Lose Weight To Maintain Weight  Women 2 - 3 carb servings 3 - 4 carb servings  Men 3 - 4 carb servings 4 - 5 carb servings  Checking your blood glucose after meals will help you know if you need to adjust the timing, type, or number of carbohydrate servings in your meal plan. Achieve and keep a healthy body weight by balancing your food intake and physical activity.  Tips How should I plan my meals?  Plan for half the food on your plate to include non-starchy vegetables, like salad greens, broccoli, or carrots. Try to eat 3 to 5 servings of non-starchy vegetables every day. Have a protein food at each meal. Protein foods include chicken, fish, meat, eggs, or beans (note that beans contain carbohydrate). These two food groups (non-starchy vegetables and proteins) are low in carbohydrate. If you fill up your plate with these foods, you will eat less carbohydrate but still fill up your stomach. Try to limit your carbohydrate  portion to  of the plate.  What fats are healthiest to eat?  Diabetes increases risk for heart disease. To help protect your heart, eat more healthy fats, such as olive oil, nuts, and avocado. Eat less saturated fats like butter, cream, and high-fat meats, like bacon and sausage. Avoid trans fats, which are in all foods that list "partially hydrogenated oil" as an ingredient. What should I drink?  Choose drinks that are not sweetened with sugar. The healthiest choices are water, carbonated or seltzer waters, and tea and coffee without added sugars.  Sweet drinks will make your blood glucose go up very quickly. One serving of soda or energy drink is  cup. It is best to drink these beverages only if your blood glucose is low.  Artificially sweetened, or diet drinks, typically do not increase your blood glucose if they have zero calories in them. Read labels of beverages, as some diet drinks do have carbohydrate and will raise your blood glucose. Label Reading Tips Read Nutrition Facts labels to find out how many grams of carbohydrate are in a food you want to eat. Don't forget: sometimes serving sizes on the label aren't the same as how much food   you are going to eat, so you may need to calculate how much carbohydrate is in the food you are serving yourself.   Carbohydrate Counting for People with Diabetes Sample 1-Day Menu  Breakfast  cup yogurt, low fat, low sugar (1 carbohydrate serving)   cup cereal, ready-to-eat, unsweetened (1 carbohydrate serving)  1 cup strawberries (1 carbohydrate serving)   cup almonds ( carbohydrate serving)  Lunch 1, 5 ounce can chunk light tuna  2 ounces cheese, low fat cheddar  6 whole wheat crackers (1 carbohydrate serving)  1 small apple (1 carbohydrate servings)   cup carrots ( carbohydrate serving)   cup snap peas  1 cup 1% milk (1 carbohydrate serving)   Evening Meal Stir fry made with: 3 ounces chicken  1 cup brown rice (3 carbohydrate servings)    cup broccoli ( carbohydrate serving)   cup green beans   cup onions  1 tablespoon olive oil  2 tablespoons teriyaki sauce ( carbohydrate serving)  Evening Snack 1 extra small banana (1 carbohydrate serving)  1 tablespoon peanut butter   Carbohydrate Counting for People with Diabetes Vegan Sample 1-Day Menu  Breakfast 1 cup cooked oatmeal (2 carbohydrate servings)   cup blueberries (1 carbohydrate serving)  2 tablespoons flaxseeds  1 cup soymilk fortified with calcium and vitamin D  1 cup coffee  Lunch 2 slices whole wheat bread (2 carbohydrate servings)   cup baked tofu   cup lettuce  2 slices tomato  2 slices avocado   cup baby carrots ( carbohydrate serving)  1 orange (1 carbohydrate serving)  1 cup soymilk fortified with calcium and vitamin D   Evening Meal Burrito made with: 1 6-inch corn tortilla (1 carbohydrate serving)  1 cup refried vegetarian beans (2 carbohydrate servings)   cup chopped tomatoes   cup lettuce   cup salsa  1/3 cup brown rice (1 carbohydrate serving)  1 tablespoon olive oil for rice   cup zucchini   Evening Snack 6 small whole grain crackers (1 carbohydrate serving)  2 apricots ( carbohydrate serving)   cup unsalted peanuts ( carbohydrate serving)    Carbohydrate Counting for People with Diabetes Vegetarian (Lacto-Ovo) Sample 1-Day Menu  Breakfast 1 cup cooked oatmeal (2 carbohydrate servings)   cup blueberries (1 carbohydrate serving)  2 tablespoons flaxseeds  1 egg  1 cup 1% milk (1 carbohydrate serving)  1 cup coffee  Lunch 2 slices whole wheat bread (2 carbohydrate servings)  2 ounces low-fat cheese   cup lettuce  2 slices tomato  2 slices avocado   cup baby carrots ( carbohydrate serving)  1 orange (1 carbohydrate serving)  1 cup unsweetened tea  Evening Meal Burrito made with: 1 6-inch corn tortilla (1 carbohydrate serving)   cup refried vegetarian beans (1 carbohydrate serving)   cup tomatoes   cup lettuce    cup salsa  1/3 cup brown rice (1 carbohydrate serving)  1 tablespoon olive oil for rice   cup zucchini  1 cup 1% milk (1 carbohydrate serving)  Evening Snack 6 small whole grain crackers (1 carbohydrate serving)  2 apricots ( carbohydrate serving)   cup unsalted peanuts ( carbohydrate serving)    Copyright 2020  Academy of Nutrition and Dietetics. All rights reserved.  Using Nutrition Labels: Carbohydrate  Serving Size  Look at the serving size. All the information on the label is based on this portion. Servings Per Container  The number of servings contained in the package. Guidelines for Carbohydrate  Look at   the total grams of carbohydrate in the serving size.  1 carbohydrate choice = 15 grams of carbohydrate. Range of Carbohydrate Grams Per Choice  Carbohydrate Grams/Choice Carbohydrate Choices  6-10   11-20 1  21-25 1  26-35 2  36-40 2  41-50 3  51-55 3  56-65 4  66-70 4  71-80 5    Copyright 2020  Academy of Nutrition and Dietetics. All rights reserved.   Additional Discharge Instructions   Please get your medications reviewed and adjusted by your Primary MD.  Please request your Primary MD to go over all Hospital Tests and Procedure/Radiological results at the follow up, please get all Hospital records sent to your Prim MD by signing hospital release before you go home.  If you had Pneumonia of Lung problems at the Hospital: Please get a 2 view Chest X ray done in approximately 4 weeks after hospital discharge or sooner if instructed by your Primary MD.  If you have Congestive Heart Failure: Please call your Cardiologist or Primary MD anytime you have any of the following symptoms:  1) 3 pound weight gain in 24 hours or 5 pounds in 1 week  2) shortness of breath, with or without a dry hacking cough  3) swelling in the hands, feet or stomach  4) if you have to sleep on extra pillows at night in order to breathe  Follow cardiac low salt diet and  1.5 lit/day fluid restriction.  If you have diabetes Accuchecks 4 times/day, Once in AM empty stomach and then before each meal. Log in all results and show them to your primary doctor at your next visit. If any glucose reading is under 80 or above 300 call your primary MD immediately.  If you have Seizure/Convulsions/Epilepsy: Please do not drive, operate heavy machinery, participate in activities at heights or participate in high speed sports until you have seen by Primary MD or a Neurologist and advised to do so again. Per  DMV statutes, patients with seizures are not allowed to drive until they have been seizure-free for six months.  Use caution when using heavy equipment or power tools. Avoid working on ladders or at heights. Take showers instead of baths. Ensure the water temperature is not too high on the home water heater. Do not go swimming alone. Do not lock yourself in a room alone (i.e. bathroom). When caring for infants or small children, sit down when holding, feeding, or changing them to minimize risk of injury to the child in the event you have a seizure. Maintain good sleep hygiene. Avoid alcohol.   If you had Gastrointestinal Bleeding: Please ask your Primary MD to check a complete blood count within one week of discharge or at your next visit. Your endoscopic/colonoscopic biopsies that are pending at the time of discharge, will also need to followed by your Primary MD.  Get Medicines reviewed and adjusted. Please take all your medications with you for your next visit with your Primary MD  Please request your Primary MD to go over all hospital tests and procedure/radiological results at the follow up, please ask your Primary MD to get all Hospital records sent to his/her office.  If you experience worsening of your admission symptoms, develop shortness of breath, life threatening emergency, suicidal or homicidal thoughts you must seek medical attention immediately  by calling 911 or calling your MD immediately  if symptoms less severe.  You must read complete instructions/literature along with all the possible adverse reactions/side effects for   all the Medicines you take and that have been prescribed to you. Take any new Medicines after you have completely understood and accpet all the possible adverse reactions/side effects.   Do not drive or operate heavy machinery when taking Pain medications.   Do not take more than prescribed Pain, Sleep and Anxiety Medications  Special Instructions: If you have smoked or chewed Tobacco  in the last 2 yrs please stop smoking, stop any regular Alcohol  and or any Recreational drug use.  Wear Seat belts while driving.  Please note You were cared for by a hospitalist during your hospital stay. If you have any questions about your discharge medications or the care you received while you were in the hospital after you are discharged, you can call the unit and asked to speak with the hospitalist on call if the hospitalist that took care of you is not available. Once you are discharged, your primary care physician will handle any further medical issues. Please note that NO REFILLS for any discharge medications will be authorized once you are discharged, as it is imperative that you return to your primary care physician (or establish a relationship with a primary care physician if you do not have one) for your aftercare needs so that they can reassess your need for medications and monitor your lab values.  You can reach the hospitalist office at phone [phone] or fax [phone]   If you do not have a primary care physician, you can call [phone] for a physician referral.  

## 2022-08-17 NOTE — Progress Notes (Signed)
ABI exam has been completed.   Results can be found under chart review under CV PROC. 08/17/2022 1:28 PM Gearld Kerstein RVT, RDMS

## 2022-08-17 NOTE — Assessment & Plan Note (Signed)
Consider if pt candidate for Mounjaro or Ozempic as outpt to both treat DM and assist with wt loss.

## 2022-08-17 NOTE — Progress Notes (Signed)
   08/17/22 0114  BiPAP/CPAP/SIPAP  BiPAP/CPAP/SIPAP Pt Type Adult (pt does self-placement of home cpap)  Mask Type Nasal mask  FiO2 (%) 21 %  Heater Temperature  (pt declined water for humidifier)  Patient Home Equipment Yes (no frays on cord noted)  Safety Check Completed by RT for Home Unit Yes, no issues noted

## 2022-08-17 NOTE — Progress Notes (Signed)
   08/17/22 1946  BiPAP/CPAP/SIPAP  BiPAP/CPAP/SIPAP Pt Type Adult (does not need assistance, self-placement of home unit)  Mask Type Nasal mask  FiO2 (%) 21 %  Heater Temperature  (does not need water for humidifier, does not use)  Patient Home Equipment Yes (no frays on cord noted, machine plugged into red outlet)  Safety Check Completed by RT for Home Unit Yes, no issues noted

## 2022-08-17 NOTE — Consult Note (Signed)
WOC Nurse Consult Note: Reason for Consult:left dorsal foot with erythema, warmth, 4th digit with full thickness wound initiating from a paronychia. Patient is followed by Podiatry in the community and saw Dr. Andrez Grime on 08/15/22, who advised presentation to the ED. Wound type:infectious Pressure Injury POA: N/A Measurement:left foot, 4th digit with 1cm round ulceration at the nail  Wound bed:see photograph taken by Dr. Logan Bores. Drainage (amount, consistency, odor) small serous Periwound:erythematous Dressing procedure/placement/frequency:  Topical care has been initiated in the meantime consistent with the outpatient POC using a daily soap and water cleanse and dry, then application of povidone iodine swabsticks. After the solution dries, folded gauze is to be placed between the digits and over the ulcer and secured with Kerlix roll gauze/paper tape.  Recommend consulting Podiatric Medicine (Dr. Logan Bores) while in house. If you agree, please order/arrange consult.  WOC nursing team will not follow, but will remain available to this patient, the nursing and medical teams.  Please re-consult if needed.  Thank you for inviting [place] to participate in this patient's Plan of Care.  Ladona Mow, MSN, RN, CNS, GNP, Leda Min, [employer], [employer] phone:  [phone]

## 2022-08-17 NOTE — Plan of Care (Signed)
  Problem: Education: Goal: Knowledge of General Education information will improve Description: Including pain rating scale, medication(s)/side effects and non-pharmacologic comfort measures Outcome: Progressing   Problem: Activity: Goal: Risk for activity intolerance will decrease Outcome: Progressing   Problem: Nutrition: Goal: Adequate nutrition will be maintained Outcome: Progressing   

## 2022-08-17 NOTE — Inpatient Diabetes Management (Signed)
Inpatient Diabetes Program Recommendations  AACE/ADA: New Consensus Statement on Inpatient Glycemic Control (2015)  Target Ranges:  Prepandial:   less than 140 mg/dL      Peak postprandial:   less than 180 mg/dL (1-2 hours)      Critically ill patients:  140 - 180 mg/dL   Lab Results  Component Value Date   GLUCAP 141 (H) 08/17/2022   HGBA1C 8.0 (H) 11/13/2016    Review of Glycemic Control  Diabetes history: DM2 Outpatient Diabetes medications: Insulin pump Current orders for Inpatient glycemic control: Insulin pump  HgbA1C - 8.0%  Basal - 3.2 units/H Bolus - Max 25 units  Inpatient Diabetes Program Recommendations:    Agree with orders.  Pt sees Dr Jacky Kindle and Ronal Fear for diabetes management. Pt states she tries to eat healthy although tends to overeat and make unhealthy choices. Explained how hyperglycemia leads to damage within blood vessels which lead to the common complications seen with uncontrolled diabetes. Stressed to the patient the importance of improving glycemic control to prevent further complications from uncontrolled diabetes. Discussed impact of nutrition, exercise, stress, sickness, and medications on diabetes control.  Pt appreciative of visit.   Thank you. Ailene Ards, RD, LDN, CDCES Inpatient Diabetes Coordinator [phone]

## 2022-08-17 NOTE — Progress Notes (Signed)
Pharmacy Antibiotic Note  [name] is a 67 y.o. female admitted on 08/16/2022 with  wound infection .  Pharmacy has been consulted for Vancomycin dosing. Baseline Scr <1.  08/17/2022 SCr 1.37> 1.51 AF, WBC 15.4> 13.6  MRI L foot: suspicious for early osteomyelitis   Plan: Vancomycin 1500mg  IV q36h to target AUC 400-550.   Estimated AUC on this regimen = 483.4 using SCr 1.51 & Vd 0.5 Ceftriaxone 2 gm IV q24 Flagyl 500 po bid  Monitor renal function and cx data   Height: 5\' 7"  (170.2 cm) Weight: 124.7 kg (275 lb) IBW/kg (Calculated) : 61.6  Temp (24hrs), Avg:98.1 F (36.7 C), Min:97.6 F (36.4 C), Max:98.3 F (36.8 C)  Recent Labs  Lab 08/16/22 1954 08/16/22 2229 08/17/22 0320  WBC 15.4*  --  13.6*  CREATININE 1.37*  --  1.51*  LATICACIDVEN 2.0* 1.2  --      Estimated Creatinine Clearance: 48.9 mL/min (A) (by C-G formula based on SCr of 1.51 mg/dL (H)).    Allergies  Allergen Reactions   Penicillins Hives    Did it involve swelling of the face/tongue/throat, SOB, or low BP? No Did it involve sudden or severe rash/hives, skin peeling, or any reaction on the inside of your mouth or nose? Yes Did you need to seek medical attention at a hospital or doctor's office? No When did it last happen?  childhood     If all above answers are "NO", may proceed with cephalosporin use.      Antimicrobials this admission: 6/26 Ceftriaxone >>  6/25 Vancomycin >>  6/24 Metronidazole >> 6/24 Cefepime x1 Dose adjustments this admission:  Microbiology results: 6/24 Wound Cx: few GPC in pairs. Few GNR MRSA PCR: neg  Thank you for allowing pharmacy to be a part of this patient's care.  Herby Abraham, Pharm.D Use secure chat for questions 08/17/2022 8:43 AM

## 2022-08-17 NOTE — TOC Initial Note (Signed)
Transition of Care [facility]) - Initial/Assessment Note    Patient Details  Name: [name] MRN: [id number] Date of Birth: May 13, 1955  Transition of [facility]) CM/SW Contact:    Catalina Gravel, LCSW Phone Number: 08/17/2022, 10:22 AM  Clinical Narrative:                 CSW met with pt at bedside, regarding [employer] consult for medication assistance.  Pt shared that she can get medication covered, but had a recent delay in Insulin script covered as it was billed incorrectly. Pt stated that this is now corrected and she will get Insulin.   Pt also shared that she has a new Ozempic prescription but due to Ins. the co pay is over $500 and she cannot afford.  CSW advised pt to consult with PCP /MD about discount options or possible samples. No further TOC needs at this time.    Barriers to Discharge: Continued Medical Work up   Patient Goals and CMS Choice            Expected Discharge Plan and Services                                              Prior Living Arrangements/Services                       Activities of Daily Living Home Assistive Devices/Equipment: Eyeglasses, Insulin Pump ADL Screening (condition at time of admission) Patient's cognitive ability adequate to safely complete daily activities?: Yes Is the patient deaf or have difficulty hearing?: No Does the patient have difficulty seeing, even when wearing glasses/contacts?: No Does the patient have difficulty concentrating, remembering, or making decisions?: No Patient able to express need for assistance with ADLs?: Yes Does the patient have difficulty dressing or bathing?: No Independently performs ADLs?: Yes (appropriate for developmental age) Does the patient have difficulty walking or climbing stairs?: No Weakness of Legs: None Weakness of Arms/Hands: None  Permission Sought/Granted                  Emotional Assessment              Admission diagnosis:  Diabetic foot  infection (HCC) [W09.811, L08.9] Diabetic infection of left foot (HCC) [B14.782, L08.9] Patient Active Problem List   Diagnosis Date Noted   Renal insufficiency 08/17/2022   Diabetic infection of left foot (HCC) 08/16/2022   Vitreomacular adhesion of both eyes 09/02/2021   Hypertensive retinopathy of both eyes, grade 2 09/02/2021   Severe nonproliferative diabetic retinopathy of left eye (HCC) 04/22/2021   Severe nonproliferative diabetic retinopathy of right eye, with macular edema, associated with type 2 diabetes mellitus (HCC) 04/22/2021   Abnormal gait 11/04/2020   Acute stress reaction 11/04/2020   Osteoporosis 02/18/2020   Fatigue 06/24/2019   Abnormal levels of other serum enzymes 09/25/2017   Absence of toe (HCC) 09/06/2017   Hypertrophic condition of skin 09/06/2017   Long term (current) use of insulin (HCC) 12/22/2016   Diarrhea 11/12/2016   AKI (acute kidney injury) (HCC) 11/12/2016   Abnormal urine 10/14/2016   Sepsis (HCC) 10/06/2016   Diabetes mellitus due to underlying condition, uncontrolled 10/06/2016   Encounter for general adult medical examination without abnormal findings 08/18/2015   Primary localized osteoarthritis of right knee 07/02/2015   S/P right unicompartmental knee replacement 07/02/2015  Infection of prosthetic left knee joint (HCC) 05/13/2015   Primary localized osteoarthritis of left knee 04/24/2015   S/P left unicompartmental knee replacement 04/24/2015   Hyperlipidemia 04/03/2015   Pelvic mass in female 02/17/2015   Mass, ovarian 02/09/2015   Hereditary and idiopathic neuropathy, unspecified 11/20/2014   Callosity 12/02/2013   CAD (coronary artery disease) 11/05/2012   Obesity 11/05/2012   Tobacco abuse 11/05/2012   OSA on CPAP 11/05/2012   HTN (hypertension) 11/05/2012   DM (diabetes mellitus) type 2, uncontrolled, with ketoacidosis (HCC) 11/05/2012   Diabetic peripheral neuropathy associated with type 2 diabetes mellitus (HCC) 01/06/2011    Hyperglycemia due to type 2 diabetes mellitus (HCC) 01/06/2011   Disorder of lung 06/25/2010   Hypothyroidism 10/17/2008   Major depression, single episode 10/17/2008   Polyneuropathy 10/17/2008   PCP:  Geoffry Paradise, MD Pharmacy:   [id number] [place], [place] - [address] AT [facility] [address] [address] Phone: [phone] Fax: [phone]  [facility] [address] [place], [place] - [address] [place] [address] Phone: [phone] Fax: [phone]     Social Determinants of Health (SDOH) Social History: SDOH Screenings   Food Insecurity: No Food Insecurity (08/17/2022)  Housing: Low Risk  (08/17/2022)  Transportation Needs: No Transportation Needs (08/17/2022)  Utilities: Not At Risk (08/17/2022)  Depression (PHQ2-9): High Risk (05/26/2022)  Tobacco Use: High Risk (08/16/2022)   SDOH Interventions:     Readmission Risk Interventions     No data to display

## 2022-08-17 NOTE — Progress Notes (Signed)
Initial Nutrition Assessment  DOCUMENTATION CODES:   Morbid obesity  INTERVENTION:  - Add -1 packet Juven BID, each packet provides 95 calories, 2.5 grams of protein (collagen), and 9.8 grams of carbohydrate (3 grams sugar); also contains 7 grams of L-arginine and L-glutamine, 300 mg vitamin C, 15 mg vitamin E, 1.2 mcg vitamin B-12, 9.5 mg zinc, 200 mg calcium, and 1.5 g  Calcium Beta-hydroxy-Beta-methylbutyrate to support wound healing  NUTRITION DIAGNOSIS:   Increased nutrient needs related to wound healing as evidenced by estimated needs.  GOAL:   Patient will meet greater than or equal to 90% of their needs  MONITOR:   PO intake, Supplement acceptance  REASON FOR ASSESSMENT:   Consult Wound healing  ASSESSMENT:   67 y.o. female admits related to wound check. PMH includes: T2DM, HTN, diabetic neuropathy. Pt is currently receiving medical management related to diabetic infection of left foot.  Meds reviewed:  lipitor, cozaar, flagyl, Juven. Labs reviewed: Na lo, BUN/Creatinine elevated.   Pt reports that she has a good appetite and has been eating well since admission. She denies any wt loss. The pt reports that she was not following a diet PTA.  RD consulted for nutrition education regarding diabetes.   Lab Results  Component Value Date   HGBA1C 8.0 (H) 11/13/2016    RD provided "Carbohydrate Counting for People with Diabetes" handout from the Academy of Nutrition and Dietetics. Discussed different food groups and their effects on blood sugar, emphasizing carbohydrate-containing foods. Provided list of carbohydrates and recommended serving sizes of common foods.  Discussed importance of controlled and consistent carbohydrate intake throughout the day. Provided examples of ways to balance meals/snacks and encouraged intake of high-fiber, whole grain complex carbohydrates. Teach back method used.  Expect fair compliance.  Body mass index is 43.07 kg/m. Pt meets criteria  for morbidly obese based on current BMI.  NUTRITION - FOCUSED PHYSICAL EXAM:  WDL- no wasting noted.   Diet Order:   Diet Order             Diet Carb Modified Fluid consistency: Thin; Room service appropriate? Yes  Diet effective now                   EDUCATION NEEDS:   Not appropriate for education at this time  Skin:  Skin Assessment: Skin Integrity Issues: Skin Integrity Issues:: Diabetic Ulcer Diabetic Ulcer: L foot  Last BM:  6/25  Height:   Ht Readings from Last 1 Encounters:  08/16/22 5\' 7"  (1.702 m)    Weight:   Wt Readings from Last 1 Encounters:  08/16/22 124.7 kg    Ideal Body Weight:     BMI:  Body mass index is 43.07 kg/m.  Estimated Nutritional Needs:   Kcal:  1800-2000 kcals  Protein:  90-115 gm  Fluid:  >/= 1.8 L  Bethann Humble, RD, LDN, CNSC.

## 2022-08-17 NOTE — Progress Notes (Signed)
PROGRESS NOTE   [name]  NWG:[id number]    DOB: 22-Sep-1955    DOA: 08/16/2022  PCP: Geoffry Paradise, MD   I have briefly reviewed patients previous medical records in [facility].  Chief Complaint  Patient presents with   Wound Check    Brief Narrative:  67-year-old female with PMH of type II DM with peripheral neuropathy, HTN, HLD, anxiety and depression, hypothyroidism, OSA on nightly CPAP, ongoing tobacco abuse, CAD, prior amputations x 2 to right foot, seen at Dr. Logan Bores, podiatry office on 6/25 for left foot infection, hospital admission for IV antibiotics advised but patient declined, started on p.o. Bactrim, sustained fall at home on 6/26, seen by PCP, foot noted to be worse and directed to ED for admission.  Admitted for left foot cellulitis.   Assessment & Plan:  Principal Problem:   Diabetic infection of left foot (HCC) Active Problems:   Renal insufficiency   Obesity   OSA on CPAP   HTN (hypertension)   Diabetic peripheral neuropathy associated with type 2 diabetes mellitus (HCC)   Diabetic foot infection of left foot/cellulitis/?  Early osteomyelitis of distal first toe, wound of 4th toe: Complicating underlying peripheral neuropathy and hammertoes. MRI of left foot: Suspicious for early osteomyelitis within the distal first phalanx.  No abscess. Left posterior tibial and dorsalis pedis palpable.  ABI: Mild left lower extremity arterial disease. Podiatry/Dr. Logan Bores aware of admission and awaiting formal consultation.  Not sure if really needs surgical intervention based on clinical findings. Did not fail Bactrim, only took a single dose prior to hospital admission. S/p cefepime + vancomycin in ED Since MRSA PCR negative, discontinued vancomycin, continue ceftriaxone and metronidazole per LE wound pathway. CRP 6.7, ESR 51 and prealbumin 19.  Uncontrolled type II DM with peripheral neuropathy: Last known A1c in [facility]: 8 on 11/13/2016. Follow A1c from  6/25-pending. Continue insulin pump.  Noted hypoglycemic episode with CBGs in the 60s around midday today.  DM coordinator consultation.  Adjust insulins as needed.  Essential hypertension: Controlled.  Follow BMP in AM and if creatinine continues to rise, consider holding Hyzaar and may have to use alternate agents i.e. amlodipine etc.  OSA on nightly CPAP Continue  CKD stage IIIb Last known creatinine in [facility] of 1.10 on 07/26/2019. Presented with creatinine of 1.37 which is slightly increased to 1.51 Has already got this morning's dose of Hyzaar.  Follow BMP in a.m. and if creatinine continues to rise, consider holding Hyzaar.  Bactrim on hold.  Hyperlipidemia: Continue statins.  Tobacco abuse: Cessation counseled.  Declines nicotine patch.  Smokes a pack per day.  Hypothyroidism: Continue Synthroid.  Check TSH.  Anxiety and depression: Continue Wellbutrin and Cymbalta.  Left fourth digit full-thickness wound: Approximately 1 cm round ulceration at the nail.  Nail appears to be missing.   [facility] RN input appreciated, continue wound care per their recommendations Podiatry input pending.  Body mass index is 43.07 kg/m./Morbid obesity Complicates care.  Outpatient follow-up.   ACP Documents: None DVT prophylaxis:   Lovenox   Code Status: Full Code:  Family Communication: None at bedside Disposition:  Status is: Inpatient Remains inpatient appropriate because: IV antibiotics     Consultants:   Podiatry/Dr. Evans-pending  Procedures:     Antimicrobials:   As above   Subjective:  Patient states that the swelling and redness of left foot, left first toe are better than on admission.  No pain/significant sensations in feet chronically.  Objective:   Vitals:  08/16/22 2312 08/17/22 0034 08/17/22 0500 08/17/22 1010  BP:  (!) 156/70 (!) 146/65 (!) 121/55  Pulse:  69 76 75  Resp:  18 18 19   Temp: 98.3 F (36.8 C) 97.6 F (36.4 C) 98.3 F (36.8 C) 97.7 F (36.5  C)  TempSrc: Oral Oral Oral Oral  SpO2:  99% 95% 97%  Weight:      Height:        General exam: Middle-age female, moderately built and morbidly obese sitting up comfortably in bed without distress. Respiratory system: Clear to auscultation. Respiratory effort normal. Cardiovascular system: S1 & S2 heard, RRR. No JVD, murmurs, rubs, gallops or clicks. No pedal edema. Gastrointestinal system: Abdomen is nondistended, soft and nontender. No organomegaly or masses felt. Normal bowel sounds heard. Central nervous system: Alert and oriented. No focal neurological deficits. Extremities: Symmetric 5 x 5 power.  Bilateral feet with deformities as noted in picture below from hospital day 2 (08/17/2022).?  Hammer feet.  Left foot with moderate swelling compared to right, patchy erythema of mid to lateral dorsal forefoot extending to proximal third and fourth toes.  Approximately 1 cm wound at site of left third toe nailbed where the nail seems to be missing.  No drainage.  No fluctuance or crepitus.  Dorsalis pedis pulse well felt. Skin: No rashes, lesions or ulcers Psychiatry: Judgement and insight appear normal. Mood & affect appropriate.        Data Reviewed:   I have personally reviewed following labs and imaging studies   CBC: Recent Labs  Lab 08/16/22 1954 08/17/22 0320  WBC 15.4* 13.6*  NEUTROABS 11.4*  --   HGB 12.7 12.9  HCT 39.1 39.2  MCV 100.5* 99.7  PLT 223 202    Basic Metabolic Panel: Recent Labs  Lab 08/16/22 1954 08/17/22 0320  NA 133* 132*  K 4.7 3.8  CL 100 101  CO2 24 22  GLUCOSE 126* 159*  BUN 23 26*  CREATININE 1.37* 1.51*  CALCIUM 9.6 9.0    Liver Function Tests: Recent Labs  Lab 08/16/22 1954  AST 15  ALT 14  ALKPHOS 116  BILITOT 0.8  PROT 7.7  ALBUMIN 3.7    CBG: Recent Labs  Lab 08/17/22 0151 08/17/22 0713 08/17/22 1127  GLUCAP 174* 133* 65*    Microbiology Studies:   Recent Results (from the past 240 hour(s))  WOUND CULTURE      Status: None (Preliminary result)   Collection Time: 08/15/22  5:43 PM   Specimen: Wound  Result Value Ref Range Status   MICRO NUMBER: [id number]  Preliminary   SPECIMEN QUALITY: Adequate  Preliminary   SOURCE: NOT GIVEN  Preliminary   STATUS: PRELIMINARY  Preliminary   GRAM STAIN:   Preliminary    No epithelial cells seen No white blood cells seen Few Gram positive cocci in pairs Few Gram negative bacilli   RESULT: Culture in progress  Preliminary  MRSA Next Gen by PCR, Nasal     Status: None   Collection Time: 08/17/22 12:57 AM   Specimen: Nasal Mucosa; Nasal Swab  Result Value Ref Range Status   MRSA by PCR Next Gen NOT DETECTED NOT DETECTED Final    Comment: (NOTE) The GeneXpert MRSA Assay (FDA approved for NASAL specimens only), is one component of a comprehensive MRSA colonization surveillance program. It is not intended to diagnose MRSA infection nor to guide or monitor treatment for MRSA infections. Test performance is not FDA approved in patients less than 33 years old.  Performed at [facility], [address] [place], [place] [address]     Radiology Studies:  VAS [place] ABI WITH/WO TBI  Result Date: 08/17/2022  LOWER EXTREMITY DOPPLER STUDY Patient Name:  [name]  Date of Exam:   08/17/2022 Medical Rec #: [id number]           Accession #:    [id number] Date of Birth: 02-Jan-1956          Patient Gender: F Patient Age:   4 years Exam Location:  [facility] Procedure:      VAS [place] ABI WITH/WO TBI Referring Phys: Lyda Perone --------------------------------------------------------------------------------  Indications: Ulceration. High Risk Factors: Hypertension, hyperlipidemia, Diabetes, current smoker,                    coronary artery disease. Other Factors: HX of 2nd and 3rd digit amputation of right foot.  Comparison Study: No previous exams Performing Technologist: Hill, Jody RVT, RDMS  Examination Guidelines: A complete evaluation  includes at minimum, Doppler waveform signals and systolic blood pressure reading at the level of bilateral brachial, anterior tibial, and posterior tibial arteries, when vessel segments are accessible. Bilateral testing is considered an integral part of a complete examination. Photoelectric Plethysmograph (PPG) waveforms and toe systolic pressure readings are included as required and additional duplex testing as needed. Limited examinations for reoccurring indications may be performed as noted.  ABI Findings: +---------+------------------+-----+---------+--------+ Right    Rt Pressure (mmHg)IndexWaveform Comment  +---------+------------------+-----+---------+--------+ Brachial 133                    triphasic         +---------+------------------+-----+---------+--------+ PTA      144               1.08 biphasic          +---------+------------------+-----+---------+--------+ DP       124               0.93 biphasic          +---------+------------------+-----+---------+--------+ Daleen Squibb               1.03 Normal            +---------+------------------+-----+---------+--------+ +---------+------------------+-----+---------+-------+ Left     Lt Pressure (mmHg)IndexWaveform Comment +---------+------------------+-----+---------+-------+ Brachial 127                    triphasic        +---------+------------------+-----+---------+-------+ PTA      123               0.92 biphasic         +---------+------------------+-----+---------+-------+ DP       120               0.90 biphasic         +---------+------------------+-----+---------+-------+ Great Toe78                0.59 Abnormal         +---------+------------------+-----+---------+-------+  Summary: Right: Resting right ankle-brachial index is within normal range. The right toe-brachial index is normal. Left: Resting left ankle-brachial index indicates mild left lower extremity arterial disease.  The left toe-brachial index is abnormal. *See table(s) above for measurements and observations.     Preliminary    MR FOOT LEFT W WO CONTRAST  Result Date: 08/17/2022 CLINICAL DATA:  Osteomyelitis, foot. Type 2 diabetes. EPIC notes indicate ulcer of great toe, infected for the past 12  days or so. EXAM: MRI OF THE LEFT FOREFOOT WITHOUT AND WITH CONTRAST TECHNIQUE: Multiplanar, multisequence MR imaging of the left forefoot was performed both before and after administration of intravenous contrast. CONTRAST:  10mL GADAVIST GADOBUTROL 1 MMOL/ML IV SOLN COMPARISON:  Outside radiographs 08/15/2022. No other comparison studies available. FINDINGS: Bones/Joint/Cartilage There are clawtoe deformities, limiting assessment of the toes. Within the distal 1st phalanx, there is increased T2 marrow signal and low level enhancement. No cortical destruction or T1 signal abnormality is identified. If the reported skin ulcer is in this location, these findings are suspicious for early osteomyelitis. No other suspicious osseous findings are identified. There are mild degenerative changes at the 1st metatarsophalangeal joint. There are advanced midfoot degenerative changes, primarily at the Lisfranc and naviculocuneiform joints. Ligaments The Lisfranc ligament appears intact. The collateral ligaments of the metatarsophalangeal joints appear intact. Muscles and Tendons Generalized forefoot muscular atrophy without intramuscular fluid collection or suspicious enhancement. The foot tendons appear intact, without significant tenosynovitis. Soft tissues No obvious skin ulceration identified. There is some soft tissue enhancement in the distal aspects of the great toe and 4th toe without focal fluid collection. There is also mild nonspecific dorsal forefoot subcutaneous edema and enhancement. Relatively decreased enhancement is noted within the soft tissues of the other toes, suggesting poor vascularity and possible early soft tissue  devitalization. IMPRESSION: 1. Nonspecific marrow edema and low level enhancement within the distal 1st phalanx, suspicious for early osteomyelitis in the appropriate clinical context. No cortical destruction or T1 signal abnormality identified. 2. No evidence of soft tissue abscess. 3. Advanced midfoot degenerative changes. 4. Generalized forefoot muscular atrophy. Relatively decreased enhancement within the soft tissues of the other toes, suggesting poor vascularity and possible early soft tissue devitalization. Electronically Signed   By: Carey Bullocks M.D.   On: 08/17/2022 08:28   DG HIP UNILAT WITH PELVIS 2-3 VIEWS LEFT  Result Date: 08/16/2022 CLINICAL DATA:  Left hip pain after fall 2 days ago. EXAM: DG HIP (WITH OR WITHOUT PELVIS) 2-3V LEFT COMPARISON:  None Available. FINDINGS: There is no evidence of hip fracture or dislocation. There is no evidence of arthropathy or other focal bone abnormality. IMPRESSION: Negative. Electronically Signed   By: Lupita Raider M.D.   On: 08/16/2022 15:04    Scheduled Meds:    atorvastatin  20 mg Oral Daily   buPROPion  300 mg Oral Daily   DULoxetine  60 mg Oral Daily   enoxaparin (LOVENOX) injection  60 mg Subcutaneous Q24H   gabapentin  600 mg Oral TID   losartan  100 mg Oral Daily   And   hydrochlorothiazide  12.5 mg Oral Daily   insulin pump   Subcutaneous TID WC, HS, 0200   levothyroxine  137 mcg Oral Q0600   metroNIDAZOLE  500 mg Oral Q12H   nutrition supplement (JUVEN)  1 packet Oral BID BM    Continuous Infusions:    sodium chloride 10 mL/hr at 08/17/22 0044   cefTRIAXone (ROCEPHIN)  IV 2 g (08/17/22 0834)   [START ON 08/18/2022] vancomycin       LOS: 1 day     Marcellus Scott, MD,  FACP, FHM, [facility], [facility], [facility]   [facility] Ad[facility] [place]     To contact the attending provider between 7A-7P or the covering provider during after hours 7P-7A, please log into the web site [email or web address] and access  using universal  password for that web site. If you do not have the  password, please call the hospital operator.  08/17/2022, 1:06 PM

## 2022-08-17 NOTE — Assessment & Plan Note (Addendum)
Creat of 1.37 today. Not clear what her baseline creat is.  Creat was 1.1 back in 2021 but nothing more recent on file that I can see in epic.  Does have diabetic neuropathy, diabetic retinopathy, and foot ulcers, so wouldn't surprise me to find out she has some degree of diabetic nephropathy as well. Alternatively today's value could just be acute creatinine elevation without reduction in GFR due to her having been started on bactrim as outpt for foot infection after office visit yesterday. Close monitoring of renal fxn with daily BMP here in hospital. Bactrim held for the moment and pt on IV ABx instead.

## 2022-08-18 ENCOUNTER — Inpatient Hospital Stay (HOSPITAL_COMMUNITY): Payer: Medicare Other

## 2022-08-18 ENCOUNTER — Encounter (HOSPITAL_COMMUNITY)
Admission: EM | Disposition: A | Discharge status: Home / Self Care | Payer: Self-pay | Source: Home / Self Care | Attending: Internal Medicine

## 2022-08-18 ENCOUNTER — Inpatient Hospital Stay (HOSPITAL_COMMUNITY): Payer: Medicare Other | Admitting: Certified Registered"

## 2022-08-18 ENCOUNTER — Encounter (HOSPITAL_COMMUNITY): Payer: Self-pay | Admitting: Internal Medicine

## 2022-08-18 DIAGNOSIS — M869 Osteomyelitis, unspecified: Secondary | ICD-10-CM

## 2022-08-18 DIAGNOSIS — L089 Local infection of the skin and subcutaneous tissue, unspecified: Secondary | ICD-10-CM | POA: Diagnosis not present

## 2022-08-18 DIAGNOSIS — E1169 Type 2 diabetes mellitus with other specified complication: Secondary | ICD-10-CM

## 2022-08-18 DIAGNOSIS — M86672 Other chronic osteomyelitis, left ankle and foot: Secondary | ICD-10-CM

## 2022-08-18 DIAGNOSIS — I2510 Atherosclerotic heart disease of native coronary artery without angina pectoris: Secondary | ICD-10-CM

## 2022-08-18 DIAGNOSIS — F17210 Nicotine dependence, cigarettes, uncomplicated: Secondary | ICD-10-CM

## 2022-08-18 DIAGNOSIS — I10 Essential (primary) hypertension: Secondary | ICD-10-CM

## 2022-08-18 DIAGNOSIS — E11628 Type 2 diabetes mellitus with other skin complications: Secondary | ICD-10-CM | POA: Diagnosis not present

## 2022-08-18 HISTORY — PX: AMPUTATION TOE: SHX6595

## 2022-08-18 LAB — CBC
HCT: 38.1 % (ref 36.0–46.0)
Hemoglobin: 12.1 g/dL (ref 12.0–15.0)
MCH: 32.1 pg (ref 26.0–34.0)
MCHC: 31.8 g/dL (ref 30.0–36.0)
MCV: 101.1 fL — ABNORMAL HIGH (ref 80.0–100.0)
Platelets: 214 10*3/uL (ref 150–400)
RBC: 3.77 MIL/uL — ABNORMAL LOW (ref 3.87–5.11)
RDW: 13.2 % (ref 11.5–15.5)
WBC: 12.4 10*3/uL — ABNORMAL HIGH (ref 4.0–10.5)
nRBC: 0 % (ref 0.0–0.2)

## 2022-08-18 LAB — WOUND CULTURE

## 2022-08-18 LAB — GLUCOSE, CAPILLARY
Glucose-Capillary: 103 mg/dL — ABNORMAL HIGH (ref 70–99)
Glucose-Capillary: 81 mg/dL (ref 70–99)
Glucose-Capillary: 108 mg/dL — ABNORMAL HIGH (ref 70–99)
Glucose-Capillary: 132 mg/dL — ABNORMAL HIGH (ref 70–99)
Glucose-Capillary: 154 mg/dL — ABNORMAL HIGH (ref 70–99)

## 2022-08-18 LAB — BASIC METABOLIC PANEL
Anion gap: 9 (ref 5–15)
BUN: 26 mg/dL — ABNORMAL HIGH (ref 8–23)
CO2: 22 mmol/L (ref 22–32)
Calcium: 9.4 mg/dL (ref 8.9–10.3)
Chloride: 104 mmol/L (ref 98–111)
Creatinine, Ser: 1.18 mg/dL — ABNORMAL HIGH (ref 0.44–1.00)
GFR, Estimated: 50 mL/min — ABNORMAL LOW (ref >60)
Glucose, Bld: 112 mg/dL — ABNORMAL HIGH (ref 70–99)
Potassium: 4.7 mmol/L (ref 3.5–5.1)
Sodium: 135 mmol/L (ref 135–145)

## 2022-08-18 LAB — HEMOGLOBIN A1C
Hgb A1c MFr Bld: 7 % — ABNORMAL HIGH (ref 4.8–5.6)
Mean Plasma Glucose: 154 mg/dL

## 2022-08-18 SURGERY — AMPUTATION, TOE
Anesthesia: General | Site: Toe | Laterality: Left

## 2022-08-18 MED ORDER — LIDOCAINE HCL 2 % IJ SOLN
INTRAMUSCULAR | Status: AC
  Filled 2022-08-18: qty 20

## 2022-08-18 MED ORDER — MIDAZOLAM HCL 5 MG/5ML IJ SOLN
INTRAMUSCULAR | Status: DC | PRN
  Administered 2022-08-18: 2 mg via INTRAVENOUS

## 2022-08-18 MED ORDER — PROPOFOL 500 MG/50ML IV EMUL
INTRAVENOUS | Status: AC
  Filled 2022-08-18: qty 100

## 2022-08-18 MED ORDER — PROMETHAZINE HCL 25 MG/ML IJ SOLN: 6.2500 mg | INTRAMUSCULAR | Status: DC | PRN

## 2022-08-18 MED ORDER — ONDANSETRON HCL 4 MG/2ML IJ SOLN
INTRAMUSCULAR | Status: AC
  Filled 2022-08-18: qty 2

## 2022-08-18 MED ORDER — OXYCODONE HCL 5 MG/5ML PO SOLN: 5.0000 mg | Freq: Once | ORAL | Status: DC | PRN

## 2022-08-18 MED ORDER — LIDOCAINE HCL (PF) 2 % IJ SOLN
INTRAMUSCULAR | Status: DC | PRN
  Administered 2022-08-18: 60 mg via INTRADERMAL

## 2022-08-18 MED ORDER — MEPERIDINE HCL 50 MG/ML IJ SOLN: 6.2500 mg | INTRAMUSCULAR | Status: DC | PRN

## 2022-08-18 MED ORDER — LIDOCAINE HCL (PF) 2 % IJ SOLN
INTRAMUSCULAR | Status: AC
  Filled 2022-08-18: qty 5

## 2022-08-18 MED ORDER — LIDOCAINE HCL (PF) 2 % IJ SOLN
INTRAMUSCULAR | Status: DC | PRN
  Administered 2022-08-18: 5 mL

## 2022-08-18 MED ORDER — PROPOFOL 500 MG/50ML IV EMUL
INTRAVENOUS | Status: DC | PRN
  Administered 2022-08-18: 100 ug/kg/min via INTRAVENOUS

## 2022-08-18 MED ORDER — MIDAZOLAM HCL 2 MG/2ML IJ SOLN
INTRAMUSCULAR | Status: AC
  Filled 2022-08-18: qty 2

## 2022-08-18 MED ORDER — LACTATED RINGERS IV SOLN: INTRAVENOUS | Status: DC

## 2022-08-18 MED ORDER — BUPIVACAINE HCL (PF) 0.5 % IJ SOLN
INTRAMUSCULAR | Status: AC
  Filled 2022-08-18: qty 30

## 2022-08-18 MED ORDER — AMISULPRIDE (ANTIEMETIC) 5 MG/2ML IV SOLN: 10.0000 mg | Freq: Once | INTRAVENOUS | Status: DC | PRN

## 2022-08-18 MED ORDER — HYDROMORPHONE HCL 1 MG/ML IJ SOLN: 0.2500 mg | INTRAMUSCULAR | Status: DC | PRN

## 2022-08-18 MED ORDER — JUVEN PO PACK
1.0000 | PACK | Freq: Two times a day (BID) | ORAL | Status: DC
  Administered 2022-08-19: 1 via ORAL
  Filled 2022-08-18 (×3): qty 1

## 2022-08-18 MED ORDER — OXYCODONE HCL 5 MG PO TABS: 5.0000 mg | ORAL_TABLET | Freq: Once | ORAL | Status: DC | PRN

## 2022-08-18 MED ORDER — BUPIVACAINE HCL (PF) 0.5 % IJ SOLN
INTRAMUSCULAR | Status: DC | PRN
  Administered 2022-08-18: 5 mL

## 2022-08-18 MED ORDER — CHLORHEXIDINE GLUCONATE 0.12 % MT SOLN
15.0000 mL | Freq: Once | OROMUCOSAL | Status: AC
  Administered 2022-08-18: 15 mL via OROMUCOSAL

## 2022-08-18 MED ORDER — LIDOCAINE HCL 1 % IJ SOLN
INTRAMUSCULAR | Status: AC
  Filled 2022-08-18: qty 20

## 2022-08-18 MED ORDER — MUPIROCIN 2 % EX OINT: 1.0000 | TOPICAL_OINTMENT | Freq: Two times a day (BID) | CUTANEOUS | Status: DC

## 2022-08-18 SURGICAL SUPPLY — 32 items
APL PRP STRL LF DISP 70% ISPRP (MISCELLANEOUS) ×1
BAG SPEC THK2 15X12 ZIP CLS (MISCELLANEOUS) ×1
BAG ZIPLOCK 12X15 (MISCELLANEOUS) ×1 IMPLANT
BANDAGE ESMARK 6X9 LF (GAUZE/BANDAGES/DRESSINGS) ×1 IMPLANT
BNDG CMPR 5X4 CHSV STRCH STRL (GAUZE/BANDAGES/DRESSINGS) ×1
BNDG CMPR 75X21 PLY HI ABS (MISCELLANEOUS) ×1
BNDG CMPR 9X6 STRL LF SNTH (GAUZE/BANDAGES/DRESSINGS) ×1
BNDG CMPR MED 10X6 ELC LF (GAUZE/BANDAGES/DRESSINGS) ×1
BNDG COHESIVE 4X5 TAN STRL LF (GAUZE/BANDAGES/DRESSINGS) ×1 IMPLANT
BNDG ELASTIC 6X10 VLCR STRL LF (GAUZE/BANDAGES/DRESSINGS) IMPLANT
BNDG ESMARK 6X9 LF (GAUZE/BANDAGES/DRESSINGS) ×1
CHLORAPREP W/TINT 26 (MISCELLANEOUS) ×1 IMPLANT
COVER SURGICAL LIGHT HANDLE (MISCELLANEOUS) ×1 IMPLANT
CUFF TOURN SGL QUICK 24 (TOURNIQUET CUFF) ×1
CUFF TRNQT CYL 24X4X16.5-23 (TOURNIQUET CUFF) IMPLANT
GAUZE SPONGE 4X4 12PLY STRL (GAUZE/BANDAGES/DRESSINGS) ×1 IMPLANT
GAUZE STRETCH 2X75IN STRL (MISCELLANEOUS) ×1 IMPLANT
GAUZE XEROFORM 1X8 LF (GAUZE/BANDAGES/DRESSINGS) ×1 IMPLANT
GLOVE BIOGEL PI IND STRL 8 (GLOVE) ×1 IMPLANT
GLOVE ECLIPSE 8.0 STRL XLNG CF (GLOVE) ×1 IMPLANT
GOWN STRL REUS W/ TWL LRG LVL3 (GOWN DISPOSABLE) ×1 IMPLANT
GOWN STRL REUS W/TWL LRG LVL3 (GOWN DISPOSABLE) ×1
KIT BASIN OR (CUSTOM PROCEDURE TRAY) ×1 IMPLANT
NDL HYPO 25X1 1.5 SAFETY (NEEDLE) ×1 IMPLANT
NEEDLE HYPO 25X1 1.5 SAFETY (NEEDLE) ×1 IMPLANT
NS IRRIG 1000ML POUR BTL (IV SOLUTION) ×1 IMPLANT
PACK ORTHO EXTREMITY (CUSTOM PROCEDURE TRAY) ×1 IMPLANT
PENCIL SMOKE EVACUATOR (MISCELLANEOUS) IMPLANT
PROTECTOR NERVE ULNAR (MISCELLANEOUS) ×1 IMPLANT
SUT ETHILON 4 0 PS 2 18 (SUTURE) ×2 IMPLANT
SUT PROLENE 4 0 PS 2 18 (SUTURE) IMPLANT
TOWEL OR 17X26 10 PK STRL BLUE (TOWEL DISPOSABLE) ×1 IMPLANT

## 2022-08-18 NOTE — Consult Note (Signed)
PODIATRY CONSULTATION  NAME [name] MRN [id number] DOB 1955/04/11 DOA 08/16/2022   Reason for consult: Cellulitis/DFU of the foot LT Chief Complaint  Patient presents with   Wound Check    Consulting physician: Lyda Perone, [facility]  History of present illness: 67 y.o. female PMHx T2DM and history of prior toe amputations to the right foot admitted for worsening infection/acute cellulitis to the left forefoot.  Seen in the office on Monday, 08/15/2022, and at that time the patient was febrile and lethargic with general malaise upon presentation.  Recommended going to the ED however the patient was opposed to this.  Finally presented to the ED 08/16/2022 and admitted for worsening infection and cellulitis of the left forefoot.  Podiatry consulted  ED Course: This patient presents to the ED for concern of foot wound, this involves an extensive number of treatment options, and is a complaint that carries with it a high risk of complications and morbidity.  The differential diagnosis includes diabetic foot ulcer, arterial disease, osteomyelitis, cellulitis   MDM: Patient seen emerged for evaluation of foot wound.  Physical exam with ulcers to multiple toes on the left with erythema extending into the midfoot and up into the shin.  Laboratory evaluation with leukocytosis to 15.4, creatinine 1.37, sed rate 51, CRP 6.7.  Broad-spectrum antibiotics initiated and MRI foot is ordered.  Patient require hospital admission for suspected osteomyelitis and associated cellulitis and a diabetic foot wound.  Patient then admitted.  Past Medical History:  Diagnosis Date   Anxiety    Arthritis    knees   Coronary artery disease    Depression    Fatty liver    History of nuclear stress test 12/16/2011   exercise myoview; normal images with 2-52mm ST-segment depression - subsequent cath revelaed subtotally occluded small 2nd marginal branch & 75% PDA lesion, normal LV function    Hypertension    Hypothyroidism    Neuropathy    in feet   OSA on CPAP    AHI = 44 (per patient) setting 13 per pt   Personal history of kidney stones    Primary localized osteoarthritis of left knee 04/24/2015   Primary localized osteoarthritis of right knee 07/02/2015   Tobacco abuse    Type 2 diabetes mellitus (HCC)    insulin pump       Latest Ref Rng & Units 08/18/2022    3:33 AM 08/17/2022    3:20 AM 08/16/2022    7:54 PM  CBC  WBC 4.0 - 10.5 K/uL 12.4  13.6  15.4   Hemoglobin 12.0 - 15.0 g/dL 53.6  64.4  03.4   Hematocrit 36.0 - 46.0 % 38.1  39.2  39.1   Platelets 150 - 400 K/uL 214  202  223        Latest Ref Rng & Units 08/18/2022    3:33 AM 08/17/2022    3:20 AM 08/16/2022    7:54 PM  BMP  Glucose 70 - 99 mg/dL 742  595  638   BUN 8 - 23 mg/dL 26  26  23    Creatinine 0.44 - 1.00 mg/dL 7.56  4.33  2.95   Sodium 135 - 145 mmol/L 135  132  133   Potassium 3.5 - 5.1 mmol/L 4.7  3.8  4.7   Chloride 98 - 111 mmol/L 104  101  100   CO2 22 - 32 mmol/L 22  22  24    Calcium 8.9 - 10.3 mg/dL 9.4  9.0  9.6       LT foot 08/17/2022   Physical Exam: General: The patient is alert and oriented x3 in no acute distress.   Dermatology: Acute cellulitis with distal toe ulcer left fourth digit and erythema with edema extending more proximal.  The soft tissue within the left fourth toe appears necrotic with a irreversible tissue necrosis.  Clinically the toe appears to be nonsalvageable.  I highly doubt that conservative care with IV antibiotics will resolve the patient's symptoms to the toe.  Vascular: VAS [place] ABI W/WO TBI 08/17/2022 ABI Findings:  +---------+------------------+-----+---------+--------+  Right   Rt Pressure (mmHg)IndexWaveform Comment   +---------+------------------+-----+---------+--------+  Brachial 133                    triphasic          +---------+------------------+-----+---------+--------+  PTA     144               1.08 biphasic            +---------+------------------+-----+---------+--------+  DP      124               0.93 biphasic           +---------+------------------+-----+---------+--------+  Great Toe137               1.03 Normal             +---------+------------------+-----+---------+--------+   +---------+------------------+-----+---------+-------+  Left    Lt Pressure (mmHg)IndexWaveform Comment  +---------+------------------+-----+---------+-------+  Brachial 127                    triphasic         +---------+------------------+-----+---------+-------+  PTA     123               0.92 biphasic          +---------+------------------+-----+---------+-------+  DP      120               0.90 biphasic          +---------+------------------+-----+---------+-------+  Great Toe78                0.59 Abnormal          +---------+------------------+-----+---------+-------+  Summary:  Right: Resting right ankle-brachial index is within normal range. The right toe-brachial index is normal.   Left: Resting left ankle-brachial index indicates mild left lower  extremity arterial disease. The left toe-brachial index is abnormal.   Neurological: Light touch and protective threshold absent bilaterally.   Musculoskeletal Exam: Prior toe amputations RT foot  MR FOOT LEFT W WO CONTRAST 08/16/2022 IMPRESSION: 1. Nonspecific marrow edema and low level enhancement within the distal 1st phalanx, suspicious for early osteomyelitis in the appropriate clinical context. No cortical destruction or T1 signal abnormality identified. 2. No evidence of soft tissue abscess. 3. Advanced midfoot degenerative changes. 4. Generalized forefoot muscular atrophy. Relatively decreased enhancement within the soft tissues of the other toes, suggesting poor vascularity and possible early soft tissue devitalization.  ASSESSMENT/PLAN OF CARE Ulcer left fourth toe with acute cellulitis and concern for  osteomyelitis  -Patient seen at bedside this evening -Given the acute onset and severity of the cellulitis around the left fourth toe, I do believe the toe is nonsalvageable.  Recommend toe amputation surgery.  This was discussed in detail with the patient.  Patient is amenable to this plan -N.p.o. after midnight this evening -Preoperative orders placed.  Plan for  amputation left fourth toe tomorrow after 2 PM -Continue IV antibiotics as ordered -Podiatry will follow     Thank you for the consult.  Please contact me directly via secure chat with any questions or concerns.     Felecia Shelling, DPM [facility]  Dr. Felecia Shelling, DPM    [address] [place], [place] [address]                Office [phone]  Fax [phone]

## 2022-08-18 NOTE — Progress Notes (Signed)
PROGRESS NOTE   [name]  GNF:[id number]    DOB: 28-Jul-1955    DOA: 08/16/2022  PCP: Geoffry Paradise, MD   I have briefly reviewed patients previous medical records in [facility].  Chief Complaint  Patient presents with   Wound Check    Brief Narrative:  D67-year-old female with PMH of type II DM with peripheral neuropathy, HTN, HLD, anxiety and depression, hypothyroidism, OSA on nightly CPAP, ongoing tobacco abuse, CAD, prior amputations x 2 to right foot, seen at Dr. Logan Bores, podiatry office on 6/25 for left foot infection, hospital admission for IV antibiotics advised but patient declined, started on p.o. Bactrim, sustained fall at home on 6/26, seen by PCP, foot noted to be worse and directed to ED for admission.  Admitted for left foot cellulitis.  Dr. Logan Bores plans left fourth toe amputation 6/27.   Assessment & Plan:  Principal Problem:   Diabetic infection of left foot (HCC) Active Problems:   Renal insufficiency   Obesity   OSA on CPAP   HTN (hypertension)   Diabetic peripheral neuropathy associated with type 2 diabetes mellitus (HCC)   Diabetic foot infection of left foot/cellulitis/?  Early osteomyelitis of distal first toe, wound of 4th toe: Complicating underlying peripheral neuropathy and hammertoes. MRI of left foot: Suspicious for early osteomyelitis within the distal first phalanx.  No abscess. Left posterior tibial and dorsalis pedis palpable.  ABI: Mild left lower extremity arterial disease. Did not fail Bactrim, only took a single dose prior to hospital admission. S/p cefepime + vancomycin in ED Since MRSA PCR negative, discontinued vancomycin, continue ceftriaxone and metronidazole per LE wound pathway. CRP 6.7, ESR 51 and prealbumin 19. Podiatry/Dr. Logan Bores consultation appreciated, feels that the left fourth toe is nonsalvageable and plans amputation surgery on 6/2 after 2 PM.    Uncontrolled type II DM with peripheral neuropathy: Last known A1c  in [facility]: 8 on 11/13/2016. 6/26: A1c 7 indicating good control. Continue insulin pump.  DM coordinator consultation.  Adjust insulins as needed. No further hypoglycemic episodes since 6/20 6 AM.  Essential hypertension: Controlled.  Continue Hyzaar.  Creatinine down from 1.51-1.18.  OSA on nightly CPAP Continue  CKD stage IIIb Last known creatinine in [facility] of 1.10 on 07/26/2019. Presented with creatinine of 1.37 which is slightly increased to 1.51 Creatinine has improved from 1.51-1.18.  Trend BMP  Hyperlipidemia: Continue statins.  Tobacco abuse: Cessation counseled.  Declines nicotine patch.  Smokes a pack per day.  Hypothyroidism: Continue Synthroid.  TSH: 3.708.  Anxiety and depression: Continue Wellbutrin and Cymbalta.  Left fourth digit full-thickness wound: Approximately 1 cm round ulceration at the nail.  Nail appears to be missing.   [facility] RN input appreciated, continue wound care per their recommendations Podiatry plans fourth toe amputation today  Body mass index is 43.07 kg/m./Morbid obesity Complicates care.  Outpatient follow-up.   ACP Documents: None DVT prophylaxis:   Lovenox   Code Status: Full Code:  Family Communication: None at bedside Disposition:  Status is: Inpatient Remains inpatient appropriate because: IV antibiotics, surgery this afternoon     Consultants:   Podiatry/Dr. Logan Bores  Procedures:     Antimicrobials:   As above   Subjective:  Seen this morning prior to procedure.  No new complaints.  No pain reported.  States that she is supposed to have foot surgery this afternoon.  Objective:   Vitals:   08/17/22 1359 08/17/22 1831 08/17/22 2058 08/18/22 0603  BP: (!) 135/57 (!) 149/70 (!) 148/71 Marland Kitchen)  130/56  Pulse: 88 85 87 79  Resp: 17 17 20 18   Temp: 98.1 F (36.7 C) 97.9 F (36.6 C) 98.1 F (36.7 C) 97.8 F (36.6 C)  TempSrc: Oral  Oral Oral  SpO2: 99% 99% 98% 98%  Weight:      Height:        General exam: Middle-age  female, moderately built and morbidly obese lying comfortably propped up in bed, still has her nasal CPAP on. Respiratory system: Clear to auscultation. Respiratory effort normal. Cardiovascular system: S1 & S2 heard, RRR. No JVD, murmurs, rubs, gallops or clicks. No pedal edema. Gastrointestinal system: Abdomen is nondistended, soft and nontender. No organomegaly or masses felt. Normal bowel sounds heard. Central nervous system: Alert and oriented. No focal neurological deficits. Extremities: Symmetric 5 x 5 power.  As examined on 6/26: Bilateral feet with deformities as noted in picture below from hospital day 2 (08/17/2022).?  Hammer toes and claw toes.  Left foot with moderate swelling compared to right, patchy erythema of mid to lateral dorsal forefoot extending to proximal third and fourth toes.  Approximately 1 cm wound at site of left third toe nailbed where the nail seems to be missing.  No drainage.  No fluctuance or crepitus.  Dorsalis pedis pulse well felt.  Today had dressing which was clean and dry, did not open to examine. Skin: No rashes, lesions or ulcers Psychiatry: Judgement and insight appear normal. Mood & affect appropriate.        Data Reviewed:   I have personally reviewed following labs and imaging studies   CBC: Recent Labs  Lab 08/16/22 1954 08/17/22 0320 08/18/22 0333  WBC 15.4* 13.6* 12.4*  NEUTROABS 11.4*  --   --   HGB 12.7 12.9 12.1  HCT 39.1 39.2 38.1  MCV 100.5* 99.7 101.1*  PLT 223 202 214    Basic Metabolic Panel: Recent Labs  Lab 08/16/22 1954 08/17/22 0320 08/18/22 0333  NA 133* 132* 135  K 4.7 3.8 4.7  CL 100 101 104  CO2 24 22 22   GLUCOSE 126* 159* 112*  BUN 23 26* 26*  CREATININE 1.37* 1.51* 1.18*  CALCIUM 9.6 9.0 9.4    Liver Function Tests: Recent Labs  Lab 08/16/22 1954  AST 15  ALT 14  ALKPHOS 116  BILITOT 0.8  PROT 7.7  ALBUMIN 3.7    CBG: Recent Labs  Lab 08/17/22 2056 08/18/22 0339 08/18/22 0831  GLUCAP  151* 103* 81    Microbiology Studies:   Recent Results (from the past 240 hour(s))  WOUND CULTURE     Status: None   Collection Time: 08/15/22  5:43 PM   Specimen: Wound  Result Value Ref Range Status   MICRO NUMBER: [id number]  Final   SPECIMEN QUALITY: Adequate  Final   SOURCE: NOT GIVEN  Final   STATUS: FINAL  Final   GRAM STAIN:   Final    No epithelial cells seen No white blood cells seen Few Gram positive cocci in pairs Few Gram negative bacilli   RESULT:   Final    A mix of organisms of questionable significance was recovered on culture and not further identified. (Note: Growth did not detect the presence of S.aureus, beta-hemolytic Streptococci or P.aeruginosa).   COMMENT:   Final    No source was provided. The specimen was tested and reported based upon the test code ordered. If this is incorrect, please contact client services.  MRSA Next Gen by PCR, Nasal  Status: None   Collection Time: 08/17/22 12:57 AM   Specimen: Nasal Mucosa; Nasal Swab  Result Value Ref Range Status   MRSA by PCR Next Gen NOT DETECTED NOT DETECTED Final    Comment: (NOTE) The GeneXpert MRSA Assay (FDA approved for NASAL specimens only), is one component of a comprehensive MRSA colonization surveillance program. It is not intended to diagnose MRSA infection nor to guide or monitor treatment for MRSA infections. Test performance is not FDA approved in patients less than 26 years old. Performed at [facility], [address] [place], [place] [address]     Radiology Studies:  VAS [place] ABI WITH/WO TBI  Result Date: 08/17/2022  LOWER EXTREMITY DOPPLER STUDY Patient Name:  NETA UPADHYAY  Date of Exam:   08/17/2022 Medical Rec #: [id number]           Accession #:    [id number] Date of Birth: [birth year]          Patient Gender: F Patient Age:   67 years Exam Location:  [facility] Procedure:      VAS [place] ABI WITH/WO TBI Referring Phys: Lyda Perone  --------------------------------------------------------------------------------  Indications: Ulceration. High Risk Factors: Hypertension, hyperlipidemia, Diabetes, current smoker,                    coronary artery disease. Other Factors: HX of 2nd and 3rd digit amputation of right foot.  Comparison Study: No previous exams Performing Technologist: Hill, Jody RVT, RDMS  Examination Guidelines: A complete evaluation includes at minimum, Doppler waveform signals and systolic blood pressure reading at the level of bilateral brachial, anterior tibial, and posterior tibial arteries, when vessel segments are accessible. Bilateral testing is considered an integral part of a complete examination. Photoelectric Plethysmograph (PPG) waveforms and toe systolic pressure readings are included as required and additional duplex testing as needed. Limited examinations for reoccurring indications may be performed as noted.  ABI Findings: +---------+------------------+-----+---------+--------+ Right    Rt Pressure (mmHg)IndexWaveform Comment  +---------+------------------+-----+---------+--------+ Brachial 133                    triphasic         +---------+------------------+-----+---------+--------+ PTA      144               1.08 biphasic          +---------+------------------+-----+---------+--------+ DP       124               0.93 biphasic          +---------+------------------+-----+---------+--------+ [facility]               1.03 Normal            +---------+------------------+-----+---------+--------+ +---------+------------------+-----+---------+-------+ Left     Lt Pressure (mmHg)IndexWaveform Comment +---------+------------------+-----+---------+-------+ Brachial 127                    triphasic        +---------+------------------+-----+---------+-------+ PTA      123               0.92 biphasic         +---------+------------------+-----+---------+-------+ DP        120               0.90 biphasic         +---------+------------------+-----+---------+-------+ Great Toe78                0.59 Abnormal         +---------+------------------+-----+---------+-------+  Summary: Right: Resting right ankle-brachial index is within normal range. The right toe-brachial index is normal. Left: Resting left ankle-brachial index indicates mild left lower extremity arterial disease. The left toe-brachial index is abnormal. *See table(s) above for measurements and observations.  Electronically signed by Coral Else MD on 08/17/2022 at 8:36:49 PM.    Final    MR FOOT LEFT W WO CONTRAST  Result Date: 08/17/2022 CLINICAL DATA:  Osteomyelitis, foot. Type 2 diabetes. EPIC notes indicate ulcer of great toe, infected for the past 12 days or so. EXAM: MRI OF THE LEFT FOREFOOT WITHOUT AND WITH CONTRAST TECHNIQUE: Multiplanar, multisequence MR imaging of the left forefoot was performed both before and after administration of intravenous contrast. CONTRAST:  10mL GADAVIST GADOBUTROL 1 MMOL/ML IV SOLN COMPARISON:  Outside radiographs 08/15/2022. No other comparison studies available. FINDINGS: Bones/Joint/Cartilage There are clawtoe deformities, limiting assessment of the toes. Within the distal 1st phalanx, there is increased T2 marrow signal and low level enhancement. No cortical destruction or T1 signal abnormality is identified. If the reported skin ulcer is in this location, these findings are suspicious for early osteomyelitis. No other suspicious osseous findings are identified. There are mild degenerative changes at the 1st metatarsophalangeal joint. There are advanced midfoot degenerative changes, primarily at the Lisfranc and naviculocuneiform joints. Ligaments The Lisfranc ligament appears intact. The collateral ligaments of the metatarsophalangeal joints appear intact. Muscles and Tendons Generalized forefoot muscular atrophy without intramuscular fluid collection or suspicious  enhancement. The foot tendons appear intact, without significant tenosynovitis. Soft tissues No obvious skin ulceration identified. There is some soft tissue enhancement in the distal aspects of the great toe and 4th toe without focal fluid collection. There is also mild nonspecific dorsal forefoot subcutaneous edema and enhancement. Relatively decreased enhancement is noted within the soft tissues of the other toes, suggesting poor vascularity and possible early soft tissue devitalization. IMPRESSION: 1. Nonspecific marrow edema and low level enhancement within the distal 1st phalanx, suspicious for early osteomyelitis in the appropriate clinical context. No cortical destruction or T1 signal abnormality identified. 2. No evidence of soft tissue abscess. 3. Advanced midfoot degenerative changes. 4. Generalized forefoot muscular atrophy. Relatively decreased enhancement within the soft tissues of the other toes, suggesting poor vascularity and possible early soft tissue devitalization. Electronically Signed   By: Carey Bullocks M.D.   On: 08/17/2022 08:28   DG HIP UNILAT WITH PELVIS 2-3 VIEWS LEFT  Result Date: 08/16/2022 CLINICAL DATA:  Left hip pain after fall 2 days ago. EXAM: DG HIP (WITH OR WITHOUT PELVIS) 2-3V LEFT COMPARISON:  None Available. FINDINGS: There is no evidence of hip fracture or dislocation. There is no evidence of arthropathy or other focal bone abnormality. IMPRESSION: Negative. Electronically Signed   By: Lupita Raider M.D.   On: 08/16/2022 15:04    Scheduled Meds:    atorvastatin  20 mg Oral Daily   buPROPion  300 mg Oral Daily   DULoxetine  60 mg Oral Daily   enoxaparin (LOVENOX) injection  60 mg Subcutaneous Q24H   gabapentin  600 mg Oral TID   losartan  100 mg Oral Daily   And   hydrochlorothiazide  12.5 mg Oral Daily   insulin pump   Subcutaneous TID WC, HS, 0200   levothyroxine  137 mcg Oral Q0600   metroNIDAZOLE  500 mg Oral Q12H   mupirocin ointment  1 Application  Nasal BID   nutrition supplement (JUVEN)  1 packet Oral BID BM    Continuous Infusions:  sodium chloride 10 mL/hr at 08/17/22 0044   cefTRIAXone (ROCEPHIN)  IV 2 g (08/18/22 0855)     LOS: 2 days     Marcellus Scott, MD,  FACP, [facility], [facility], [facility], [facility]   [facility] [place]     To contact the attending provider between 7A-7P or the covering provider during after hours 7P-7A, please log into the web site [email or web address] and access using universal [place] password for that web site. If you do not have the password, please call the hospital operator.  08/18/2022, 11:21 AM

## 2022-08-18 NOTE — Transfer of Care (Signed)
Immediate Anesthesia Transfer of Care Note  Patient: [name]  Procedure(s) Performed: AMPUTATION TOE (Left: Toe)  Patient Location: PACU  Anesthesia Type:MAC  Level of Consciousness: awake, alert , oriented, and patient cooperative  Airway & Oxygen Therapy: Patient Spontanous Breathing and Patient connected to face mask oxygen  Post-op Assessment: Report given to RN and Post -op Vital signs reviewed and stable  Post vital signs: Reviewed and stable  Last Vitals:  Vitals Value Taken Time  BP    Temp    Pulse 95 08/18/22 1637  Resp 19 08/18/22 1637  SpO2 97 % 08/18/22 1637  Vitals shown include unvalidated device data.  Last Pain:  Vitals:   08/18/22 1252  TempSrc: Oral  PainSc: 0-No pain      Patients Stated Pain Goal: 1 (08/17/22 0035)  Complications: No notable events documented.

## 2022-08-18 NOTE — Op Note (Signed)
   OPERATIVE REPORT Patient name: [name] MRN: [id number] DOB: 05-Dec-1955  DOS: 08/18/22  Preop Dx: Osteomyelitis left fourth toe Postop Dx: same  Procedure:  1.  Amputation left fourth toe  Surgeon: Felecia Shelling DPM  Anesthesia: 50-50 mixture of 2% lidocaine plain with 0.5% Marcaine plain totaling 10 mL infiltrated in the patient's left forefoot digital block fashion  Hemostasis: Calf tourniquet inflated to a pressure of after esmarch exsanguination   EBL: Minimal mL Materials: None Injectables: None Pathology: Left fourth toe sent for discard  Condition: The patient tolerated the procedure and anesthesia well. No complications noted or reported   Justification for procedure: The patient is a 67 y.o. female PMHx T2DM who presents today for surgical correction of acute onset of cellulitis and purulent abscess with underlying osteomyelitis left fourth toe. The patient was told benefits as well as possible side effects of the surgery. The patient consented for surgical correction. The patient consent form was reviewed. All patient questions were answered. No guarantees were expressed or implied. The patient and the surgeon both signed the patient consent form with the witness present and placed in the patient's chart.   Procedure in Detail: The patient was brought to the operating room, placed in the operating table in the supine position at which time an aseptic scrub and drape were performed about the patient's respective lower extremity after anesthesia was induced as described above. Attention was then directed to the surgical area where procedure number one commenced.  Procedure #1: Amputation left fourth toe A racquet type incision was planned and made around the fourth MTP of the left foot.  Incision was carried down to the level of joint where a joint capsulotomy was performed.  The fourth toe was distracted with a perforating towel clamp and the toe was  disarticulated at the level of the MTP using #15 scalpel.  The toe was removed in toto and placed in sterile specimen container for discard.  Any necrotic nonviable tissue or purulent tissue was excisionally removed from the amputation site.  Copious irrigation was utilized and primary closure was achieved using 4-0 Prolene suture.  Dry sterile compressive dressings were then applied to all previously mentioned incision sites about the patient's lower extremity. The tourniquet which was used for hemostasis was deflated. All normal neurovascular responses including pink color and warmth returned all the digits of patient's lower extremity.  The patient was then transferred from the operating room to the recovery room having tolerated the procedure and anesthesia well. All vital signs are stable. After a brief stay in the recovery room the patient was readmitted to inpatient room with postoperative orders placed.    IMPRESSION: Suspect clean margins.  Good potential for healing  Felecia Shelling, DPM [facility]  Dr. Felecia Shelling, DPM    [address] [place], [place] [address]                Office [phone]  Fax [phone]

## 2022-08-18 NOTE — Brief Op Note (Signed)
08/18/2022  4:41 PM  PATIENT:  [name]  67 y.o. female  PRE-OPERATIVE DIAGNOSIS:  osteomylitis left 4th toe  POST-OPERATIVE DIAGNOSIS:  osteomylitis left 4th toe  PROCEDURE:  Procedure(s): AMPUTATION TOE (Left)  SURGEON:  Surgeon(s) and Role:    Felecia Shelling, DPM - Primary  PHYSICIAN ASSISTANT:   ASSISTANTS: none   ANESTHESIA:   local and MAC  EBL:  20 mL   BLOOD ADMINISTERED:none  DRAINS: none   LOCAL MEDICATIONS USED:  MARCAINE   , LIDOCAINE , and Amount: 10 ml  SPECIMEN:  No Specimen  DISPOSITION OF SPECIMEN:  N/A  COUNTS:  YES  TOURNIQUET:   Total Tourniquet Time Documented: Calf (Left) - 23 minutes Total: Calf (Left) - 23 minutes   DICTATION: .Reubin Milan Dictation  PLAN OF CARE: Admit to inpatient   PATIENT DISPOSITION:  PACU - hemodynamically stable.   Delay start of Pharmacological VTE agent (>24hrs) due to surgical blood loss or risk of bleeding: not applicable  Felecia Shelling, DPM [facility]  Dr. Felecia Shelling, DPM    [address] [place], [place] [address]                Office [phone]  Fax [phone]

## 2022-08-18 NOTE — Plan of Care (Signed)
?  Problem: Activity: ?Goal: Risk for activity intolerance will decrease ?Outcome: Progressing ?  ?Problem: Safety: ?Goal: Ability to remain free from injury will improve ?Outcome: Progressing ?  ?Problem: Pain Managment: ?Goal: General experience of comfort will improve ?Outcome: Progressing ?  ?

## 2022-08-18 NOTE — Anesthesia Preprocedure Evaluation (Signed)
Anesthesia Evaluation  Patient identified by MRN, date of birth, ID band Patient awake    Reviewed: Allergy & Precautions, NPO status , Patient's Chart, lab work & pertinent test results  Airway Mallampati: II  TM Distance: >3 FB Neck ROM: Full    Dental no notable dental hx. (+) Teeth Intact   Pulmonary sleep apnea , Current Smoker and Patient abstained from smoking.   breath sounds clear to auscultation       Cardiovascular hypertension, + CAD   Rhythm:Regular Rate:Normal     Neuro/Psych   Anxiety Depression       GI/Hepatic   Endo/Other  diabetesHypothyroidism  Morbid obesity  Renal/GU      Musculoskeletal  (+) Arthritis ,    Abdominal  (+) + obese  Peds  Hematology   Anesthesia Other Findings   Reproductive/Obstetrics                             Anesthesia Physical Anesthesia Plan  ASA: III  Anesthesia Plan: General   Post-op Pain Management: Gabapentin PO (pre-op)* and Minimal or no pain anticipated   Induction: Intravenous  PONV Risk Score and Plan: 2 and Ondansetron, Midazolam and Treatment may vary due to age or medical condition  Airway Management Planned: LMA  Additional Equipment:   Intra-op Plan:   Post-operative Plan: Extubation in OR  Informed Consent: I have reviewed the patients History and Physical, chart, labs and discussed the procedure including the risks, benefits and alternatives for the proposed anesthesia with the patient or authorized representative who has indicated his/her understanding and acceptance.       Plan Discussed with: CRNA  Anesthesia Plan Comments:         Anesthesia Quick Evaluation

## 2022-08-18 NOTE — Anesthesia Postprocedure Evaluation (Signed)
Anesthesia Post Note  Patient: [name]  Procedure(s) Performed: AMPUTATION TOE (Left: Toe)     Patient location during evaluation: PACU Anesthesia Type: General Level of consciousness: awake and alert Pain management: pain level controlled Vital Signs Assessment: post-procedure vital signs reviewed and stable Respiratory status: spontaneous breathing, nonlabored ventilation and respiratory function stable Cardiovascular status: blood pressure returned to baseline and stable Postop Assessment: no apparent nausea or vomiting Anesthetic complications: no   No notable events documented.  Last Vitals:  Vitals:   08/18/22 1700 08/18/22 1715  BP: (!) 147/74 125/68  Pulse: 91 88  Resp: 18 (!) 23  Temp:    SpO2: 94% 99%    Last Pain:  Vitals:   08/18/22 1715  TempSrc:   PainSc: 0-No pain                 Lowella Curb

## 2022-08-18 NOTE — H&P (Signed)
Anesthesia H&P Update: History and Physical Exam reviewed; patient is OK for planned anesthetic and procedure. ? ?

## 2022-08-18 NOTE — Progress Notes (Signed)
   08/18/22 1258  OBSTRUCTIVE SLEEP APNEA  Have you ever been diagnosed with sleep apnea through a sleep study? Yes  If yes, do you have and use a CPAP or BPAP machine every night? 1  Do you know the presssure settings on your maching? Yes (13 (67 yrs old when started wearing CPAP))  Do you snore loudly (loud enough to be heard through closed doors)?  0  Do you often feel tired, fatigued, or sleepy during the daytime (such as falling asleep during driving or talking to someone)? 0  Has anyone observed you stop breathing during your sleep? 1  Do you have, or are you being treated for high blood pressure? 1  BMI more than 35 kg/m2? 1  Age > 50 (1-yes) 1  Female Gender (Yes=1) 0  Obstructive Sleep Apnea Score 4   Patient is under the care of a sleep physician

## 2022-08-18 NOTE — Progress Notes (Signed)
   08/18/22 1945  BiPAP/CPAP/SIPAP  BiPAP/CPAP/SIPAP Pt Type Adult (pt does self-placement of home unit, does not need assistance)  Mask Type Nasal mask  FiO2 (%) 21 %  Heater Temperature  (pt does not want water for humidifier, does not use it)  Patient Home Equipment Yes (no frays on cord noted)  Safety Check Completed by RT for Home Unit Yes, no issues noted

## 2022-08-19 ENCOUNTER — Encounter (HOSPITAL_COMMUNITY): Payer: Self-pay | Admitting: Podiatry

## 2022-08-19 DIAGNOSIS — L089 Local infection of the skin and subcutaneous tissue, unspecified: Secondary | ICD-10-CM | POA: Diagnosis not present

## 2022-08-19 DIAGNOSIS — E11628 Type 2 diabetes mellitus with other skin complications: Secondary | ICD-10-CM | POA: Diagnosis not present

## 2022-08-19 LAB — CBC
HCT: 38.2 % (ref 36.0–46.0)
Hemoglobin: 12.5 g/dL (ref 12.0–15.0)
MCH: 33 pg (ref 26.0–34.0)
MCHC: 32.7 g/dL (ref 30.0–36.0)
MCV: 100.8 fL — ABNORMAL HIGH (ref 80.0–100.0)
Platelets: 231 10*3/uL (ref 150–400)
RBC: 3.79 MIL/uL — ABNORMAL LOW (ref 3.87–5.11)
RDW: 13.1 % (ref 11.5–15.5)
WBC: 10.2 10*3/uL (ref 4.0–10.5)
nRBC: 0 % (ref 0.0–0.2)

## 2022-08-19 LAB — BASIC METABOLIC PANEL
Anion gap: 8 (ref 5–15)
BUN: 24 mg/dL — ABNORMAL HIGH (ref 8–23)
CO2: 24 mmol/L (ref 22–32)
Calcium: 9.7 mg/dL (ref 8.9–10.3)
Chloride: 105 mmol/L (ref 98–111)
Creatinine, Ser: 0.88 mg/dL (ref 0.44–1.00)
GFR, Estimated: >60 mL/min (ref >60)
Glucose, Bld: 86 mg/dL (ref 70–99)
Potassium: 4 mmol/L (ref 3.5–5.1)
Sodium: 137 mmol/L (ref 135–145)

## 2022-08-19 LAB — GLUCOSE, CAPILLARY
Glucose-Capillary: 59 mg/dL — ABNORMAL LOW (ref 70–99)
Glucose-Capillary: 76 mg/dL (ref 70–99)
Glucose-Capillary: 86 mg/dL (ref 70–99)
Glucose-Capillary: 56 mg/dL — ABNORMAL LOW (ref 70–99)
Glucose-Capillary: 112 mg/dL — ABNORMAL HIGH (ref 70–99)
Glucose-Capillary: 61 mg/dL — ABNORMAL LOW (ref 70–99)

## 2022-08-19 MED ORDER — JUVEN PO PACK: 1.0000 | PACK | Freq: Two times a day (BID) | ORAL | 0 refills | Status: AC

## 2022-08-19 NOTE — Plan of Care (Signed)

## 2022-08-19 NOTE — Discharge Summary (Signed)
Physician Discharge Summary  [name] ZOX:[id number] DOB: 01-Feb-1956  PCP: Geoffry Paradise, MD  Admitted from: Home Discharged to: Home  Admit date: 08/16/2022 Discharge date: 08/19/2022  Recommendations for Outpatient Follow-up:    Follow-up Information     Geoffry Paradise, MD. Schedule an appointment as soon as possible for a visit in 1 week(s).   Specialty: Internal Medicine Why: Follow-up with repeat labs (CBC & BMP). Contact information: [address] [place] [address] [phone]         Felecia Shelling, DPM Follow up in 1 week(s).   Specialty: Podiatry Why: Call for an appointment to be seen next week for postop follow-up. Contact information: [address] [place] [address] [phone]                  Home Health: None    Equipment/Devices: Left lower extremity postop shoe.    Discharge Condition: Improved and stable.   Code Status: Full Code Diet recommendation:     Discharge Diagnoses:  Principal Problem:   Diabetic infection of left foot (HCC) Active Problems:   Renal insufficiency   Obesity   OSA on CPAP   HTN (hypertension)   Diabetic peripheral neuropathy associated with type 2 diabetes mellitus (HCC)   Brief Summary: D67-year-old female with PMH of type II DM with peripheral neuropathy, HTN, HLD, anxiety and depression, hypothyroidism, OSA on nightly CPAP, ongoing tobacco abuse, CAD, prior amputations x 2 to right foot, seen at Dr. Logan Bores, podiatry office on 6/25 for left foot infection, hospital admission for IV antibiotics advised but patient declined, started on p.o. Bactrim, sustained fall at home on 6/26, seen by PCP, foot noted to be worse and directed to ED for admission.  Admitted for left foot cellulitis.  S/p amputation of left fourth toe by Dr. Logan Bores on 6/27.     Assessment & Plan:    Diabetic foot infection of left foot/cellulitis, wound and osteomyelitis of left fourth toe, s/p amputation  6/27. Complicating underlying peripheral neuropathy and hammertoes. MRI of left foot: Suspicious for early osteomyelitis within the distal first phalanx.  No abscess. Left posterior tibial and dorsalis pedis palpable.  ABI: Mild left lower extremity arterial disease. Did not fail Bactrim, only took a single dose prior to hospital admission. S/p cefepime + vancomycin in ED Since MRSA PCR negative, discontinued vancomycin, continue ceftriaxone and metronidazole per LE wound pathway. CRP 6.7, ESR 51 and prealbumin 19. Podiatry/Dr. Logan Bores consultation appreciated, feels that the left fourth toe is nonsalvageable and performed amputation of left fourth toe. As per my communication with him preop yesterday, he was not concerned about the left first toe.  He indicated that the MRI was not read correctly.  The fourth toe was the most concerning and basically the MRI was not very helpful. Patient eager to DC.  Communicated again with Dr. Logan Bores: He took cultures in the office on 6/24 but they were essentially inconclusive.  He did not think that her soft tissue margins yesterday were truly clean or clear from infection postop.  He has cleared for DC home on prior home Cipro plus Bactrim of which she had taken only a single dose each PTA, weightbearing as tolerated with left lower extremity postop shoe, dressing remains until follow-up in his office next week (his office will call with appointment).  Patient has not required much of any pain meds, likely related to her peripheral neuropathy   Uncontrolled type II DM with peripheral neuropathy: Last  known A1c in [facility]: 8 on 11/13/2016. 6/26: A1c 7 indicating good control. Continue insulin pump.  DM coordinator consultation.  Adjust insulins as needed. Again had hypoglycemic episode this morning, resolved after some orange juice.  DM coordinator evaluated patient along with nursing and advised that patient should follow-up with her insulin pump coordinator as  outpatient for changes.   Essential hypertension: Controlled.  Continue Hyzaar.  Creatinine down from 1.51- 0.88.  Follow-up BMP as outpatient in a week's time.   OSA on nightly CPAP Continue   Acute kidney injury complicating CKD stage IIIb Last known creatinine in [facility] of 1.10 on 07/26/2019. Presented with creatinine of 1.37 which is slightly increased to 1.51 Creatinine normalized to 0.88 by time of discharge.  Etiology of elevated creatinine unclear.  Do not think that Bactrim was the cause because she had only taken a single dose. Recommended adequate hydration, avoid nephrotoxics, follow BMP with PCP in a week's time.   Hyperlipidemia: Continue statins.   Tobacco abuse: Cessation counseled.  Declines nicotine patch.  Smokes a pack per day.   Hypothyroidism: Continue Synthroid.  TSH: 3.708.   Anxiety and depression: Continue Wellbutrin and Cymbalta.   Body mass index is 43.07 kg/m./Morbid obesity Complicates care.  Outpatient follow-up.     Consultants:   Podiatry/Dr. Logan Bores   Procedures:   As noted above  Discharge Instructions  Discharge Instructions     Call MD for:  extreme fatigue   Complete by: As directed    Call MD for:  persistant dizziness or light-headedness   Complete by: As directed    Call MD for:  persistant nausea and vomiting   Complete by: As directed    Call MD for:  redness, tenderness, or signs of infection (pain, swelling, redness, odor or green/yellow discharge around incision site)   Complete by: As directed    Call MD for:  severe uncontrolled pain   Complete by: As directed    Call MD for:  temperature >100.4   Complete by: As directed    Diet - low sodium heart healthy   Complete by: As directed    Diet Carb Modified   Complete by: As directed    Discharge instructions   Complete by: As directed    Weightbearing as tolerated on the left lower extremity with postop shoe.   Increase activity slowly   Complete by: As directed     Leave dressing on - Keep it clean, dry, and intact until clinic visit   Complete by: As directed         Medication List     TAKE these medications    atorvastatin 20 MG tablet Commonly known as: LIPITOR Take 20 mg by mouth daily.   buPROPion 300 MG 24 hr tablet Commonly known as: WELLBUTRIN XL Take 300 mg by mouth daily.   ciprofloxacin 500 MG tablet Commonly known as: Cipro Take 1 tablet (500 mg total) by mouth 2 (two) times daily for 10 days.   DULoxetine 60 MG capsule Commonly known as: CYMBALTA Take 60 mg by mouth daily.   FreeStyle Libre 14 Day Reader [name] Use to monitor blood glucose   FreeStyle Libre 14 Day Sensor Misc Use to monitor blood glucose continuously-Dx code E11.29   gabapentin 300 MG capsule Commonly known as: NEURONTIN Take 600 mg by mouth 3 (three) times daily.   insulin pump Soln Inject 1 each into the skin continuous. Humalog - basal rate 3.2 Units/hr What changed: additional instructions  levothyroxine 137 MCG tablet Commonly known as: SYNTHROID Take 137 mcg by mouth daily before breakfast.   losartan-hydrochlorothiazide 100-12.5 MG tablet Commonly known as: HYZAAR Take 1 tablet by mouth daily.   nutrition supplement (JUVEN) Pack Take 1 packet by mouth 2 (two) times daily between meals.   OVER THE COUNTER MEDICATION Pt uses CPAP machine daily.   sulfamethoxazole-trimethoprim 800-160 MG tablet Commonly known as: BACTRIM DS Take 1 tablet by mouth 2 (two) times daily.       Allergies  Allergen Reactions   Penicillins Hives    Did it involve swelling of the face/tongue/throat, SOB, or low BP? No Did it involve sudden or severe rash/hives, skin peeling, or any reaction on the inside of your mouth or nose? Yes Did you need to seek medical attention at a hospital or doctor's office? No When did it last happen?  childhood     If all above answers are "NO", may proceed with cephalosporin use.        Procedures/Studies: DG  Foot Complete Left  Result Date: 08/18/2022 CLINICAL DATA:  Status post amputation of the left fourth digit. EXAM: LEFT FOOT - COMPLETE 3+ VIEW COMPARISON:  Left foot radiograph dated 08/15/2022. FINDINGS: Amputation of the phalanges of the fourth digit. There is no acute fracture or dislocation. The bones are osteopenic. Mild hallux valgus. Degenerative changes of the midfoot. Soft tissue swelling of the forefoot. IMPRESSION: Amputation of the phalanges of the fourth digit. Electronically Signed   By: Elgie Collard M.D.   On: 08/18/2022 20:48   VAS [place] ABI WITH/WO TBI  Result Date: 08/17/2022  LOWER EXTREMITY DOPPLER STUDY Patient Name:  [name]  Date of Exam:   08/17/2022 Medical Rec #: [id number]           Accession #:    [id number] Date of Birth: April 21, 1955          Patient Gender: F Patient Age:   66 years Exam Location:  [facility] Procedure:      VAS [place] ABI WITH/WO TBI Referring Phys: Lyda Perone --------------------------------------------------------------------------------  Indications: Ulceration. High Risk Factors: Hypertension, hyperlipidemia, Diabetes, current smoker,                    coronary artery disease. Other Factors: HX of 2nd and 3rd digit amputation of right foot.  Comparison Study: No previous exams Performing Technologist: Hill, Jody RVT, RDMS  Examination Guidelines: A complete evaluation includes at minimum, Doppler waveform signals and systolic blood pressure reading at the level of bilateral brachial, anterior tibial, and posterior tibial arteries, when vessel segments are accessible. Bilateral testing is considered an integral part of a complete examination. Photoelectric Plethysmograph (PPG) waveforms and toe systolic pressure readings are included as required and additional duplex testing as needed. Limited examinations for reoccurring indications may be performed as noted.  ABI Findings: +---------+------------------+-----+---------+--------+ Right     Rt Pressure (mmHg)IndexWaveform Comment  +---------+------------------+-----+---------+--------+ Brachial 133                    triphasic         +---------+------------------+-----+---------+--------+ PTA      144               1.08 biphasic          +---------+------------------+-----+---------+--------+ DP       124               0.93 biphasic          +---------+------------------+-----+---------+--------+  Great [id number]               1.03 Normal            +---------+------------------+-----+---------+--------+ +---------+------------------+-----+---------+-------+ Left     Lt Pressure (mmHg)IndexWaveform Comment +---------+------------------+-----+---------+-------+ Brachial 127                    triphasic        +---------+------------------+-----+---------+-------+ PTA      123               0.92 biphasic         +---------+------------------+-----+---------+-------+ DP       120               0.90 biphasic         +---------+------------------+-----+---------+-------+ Great Toe78                0.59 Abnormal         +---------+------------------+-----+---------+-------+  Summary: Right: Resting right ankle-brachial index is within normal range. The right toe-brachial index is normal. Left: Resting left ankle-brachial index indicates mild left lower extremity arterial disease. The left toe-brachial index is abnormal. *See table(s) above for measurements and observations.  Electronically signed by Coral Else MD on 08/17/2022 at 8:36:49 PM.    Final    MR FOOT LEFT W WO CONTRAST  Result Date: 08/17/2022 CLINICAL DATA:  Osteomyelitis, foot. Type 2 diabetes. EPIC notes indicate ulcer of great toe, infected for the past 12 days or so. EXAM: MRI OF THE LEFT FOREFOOT WITHOUT AND WITH CONTRAST TECHNIQUE: Multiplanar, multisequence MR imaging of the left forefoot was performed both before and after administration of intravenous contrast. CONTRAST:  10mL  GADAVIST GADOBUTROL 1 MMOL/ML IV SOLN COMPARISON:  Outside radiographs 08/15/2022. No other comparison studies available. FINDINGS: Bones/Joint/Cartilage There are clawtoe deformities, limiting assessment of the toes. Within the distal 1st phalanx, there is increased T2 marrow signal and low level enhancement. No cortical destruction or T1 signal abnormality is identified. If the reported skin ulcer is in this location, these findings are suspicious for early osteomyelitis. No other suspicious osseous findings are identified. There are mild degenerative changes at the 1st metatarsophalangeal joint. There are advanced midfoot degenerative changes, primarily at the Lisfranc and naviculocuneiform joints. Ligaments The Lisfranc ligament appears intact. The collateral ligaments of the metatarsophalangeal joints appear intact. Muscles and Tendons Generalized forefoot muscular atrophy without intramuscular fluid collection or suspicious enhancement. The foot tendons appear intact, without significant tenosynovitis. Soft tissues No obvious skin ulceration identified. There is some soft tissue enhancement in the distal aspects of the great toe and 4th toe without focal fluid collection. There is also mild nonspecific dorsal forefoot subcutaneous edema and enhancement. Relatively decreased enhancement is noted within the soft tissues of the other toes, suggesting poor vascularity and possible early soft tissue devitalization. IMPRESSION: 1. Nonspecific marrow edema and low level enhancement within the distal 1st phalanx, suspicious for early osteomyelitis in the appropriate clinical context. No cortical destruction or T1 signal abnormality identified. 2. No evidence of soft tissue abscess. 3. Advanced midfoot degenerative changes. 4. Generalized forefoot muscular atrophy. Relatively decreased enhancement within the soft tissues of the other toes, suggesting poor vascularity and possible early soft tissue devitalization.  Electronically Signed   By: Carey Bullocks M.D.   On: 08/17/2022 08:28   DG HIP UNILAT WITH PELVIS 2-3 VIEWS LEFT  Result Date: 08/16/2022 CLINICAL DATA:  Left hip pain after fall 2 days ago. EXAM: DG HIP (WITH OR WITHOUT  PELVIS) 2-3V LEFT COMPARISON:  None Available. FINDINGS: There is no evidence of hip fracture or dislocation. There is no evidence of arthropathy or other focal bone abnormality. IMPRESSION: Negative. Electronically Signed   By: Lupita Raider M.D.   On: 08/16/2022 15:04    Subjective: Denies complaints.  No pain reported.  Very eager to discharge today.  Per nursing, no acute issues, transient hypoglycemia which resolved after orange juice.  Ambulated safely with RN with postop shoe.  Discharge Exam:  Vitals:   08/18/22 2018 08/19/22 0216 08/19/22 0402 08/19/22 0915  BP: 134/66 135/72 (!) 120/56 (!) 110/52  Pulse: 85 72 (!) 59 71  Resp: 17 18 18 18   Temp: 98.2 F (36.8 C) (!) 97.5 F (36.4 C) 98.3 F (36.8 C) 97.9 F (36.6 C)  TempSrc: Oral Oral  Oral  SpO2: 98% 97% 99% 99%  Weight:      Height:        General exam: Middle-age female, moderately built and morbidly obese lying comfortably propped up in bed, still has her nasal CPAP on. Respiratory system: Clear to auscultation. Respiratory effort normal. Cardiovascular system: S1 & S2 heard, RRR. No JVD, murmurs, rubs, gallops or clicks. No pedal edema. Gastrointestinal system: Abdomen is nondistended, soft and nontender. No organomegaly or masses felt. Normal bowel sounds heard. Central nervous system: Alert and oriented. No focal neurological deficits. Extremities: Symmetric 5 x 5 power.  As examined on 6/26: Bilateral feet with deformities as noted in picture below from hospital day 2 (08/17/2022).?  Hammer toes and claw toes.  Left foot with moderate swelling compared to right, patchy erythema of mid to lateral dorsal forefoot extending to proximal third and fourth toes.  Approximately 1 cm wound at site of  left third toe nailbed where the nail seems to be missing.  No drainage.  No fluctuance or crepitus.  Dorsalis pedis pulse well felt.  Today had dressing which was clean and dry, did not open to examine. Skin: No rashes, lesions or ulcers Psychiatry: Judgement and insight appear normal. Mood & affect appropriate.   Admission pictures as follows:             The results of significant diagnostics from this hospitalization (including imaging, microbiology, ancillary and laboratory) are listed below for reference.     Microbiology: Recent Results (from the past 240 hour(s))  WOUND CULTURE     Status: None   Collection Time: 08/15/22  5:43 PM   Specimen: Wound  Result Value Ref Range Status   MICRO NUMBER: [id number]  Final   SPECIMEN QUALITY: Adequate  Final   SOURCE: NOT GIVEN  Final   STATUS: FINAL  Final   GRAM STAIN:   Final    No epithelial cells seen No white blood cells seen Few Gram positive cocci in pairs Few Gram negative bacilli   RESULT:   Final    A mix of organisms of questionable significance was recovered on culture and not further identified. (Note: Growth did not detect the presence of S.aureus, beta-hemolytic Streptococci or P.aeruginosa).   COMMENT:   Final    No source was provided. The specimen was tested and reported based upon the test code ordered. If this is incorrect, please contact client services.  MRSA Next Gen by PCR, Nasal     Status: None   Collection Time: 08/17/22 12:57 AM   Specimen: Nasal Mucosa; Nasal Swab  Result Value Ref Range Status   MRSA by PCR Next Gen NOT DETECTED  NOT DETECTED Final    Comment: (NOTE) The GeneXpert MRSA Assay (FDA approved for NASAL specimens only), is one component of a comprehensive MRSA colonization surveillance program. It is not intended to diagnose MRSA infection nor to guide or monitor treatment for MRSA infections. Test performance is not FDA approved in patients less than 59 years old. Performed at  [facility], [address] [place], [place] [address]      Labs: CBC: Recent Labs  Lab 08/16/22 1954 08/17/22 0320 08/18/22 0333 08/19/22 0316  WBC 15.4* 13.6* 12.4* 10.2  NEUTROABS 11.4*  --   --   --   HGB 12.7 12.9 12.1 12.5  HCT 39.1 39.2 38.1 38.2  MCV 100.5* 99.7 101.1* 100.8*  PLT 223 202 214 231    Basic Metabolic Panel: Recent Labs  Lab 08/16/22 1954 08/17/22 0320 08/18/22 0333 08/19/22 0316  NA 133* 132* 135 137  K 4.7 3.8 4.7 4.0  CL 100 101 104 105  CO2 24 22 22 24   GLUCOSE 126* 159* 112* 86  BUN 23 26* 26* 24*  CREATININE 1.37* 1.51* 1.18* 0.88  CALCIUM 9.6 9.0 9.4 9.7    Liver Function Tests: Recent Labs  Lab 08/16/22 1954  AST 15  ALT 14  ALKPHOS 116  BILITOT 0.8  PROT 7.7  ALBUMIN 3.7    CBG: Recent Labs  Lab 08/19/22 0204 08/19/22 0226 08/19/22 0318 08/19/22 0719 08/19/22 0831  GLUCAP 59* 76 86 56* 112*    Hgb A1c Recent Labs    08/16/22 2301  HGBA1C 7.0*     Thyroid function studies Recent Labs    08/17/22 0316  TSH 3.708      Time coordinating discharge: 35 minutes  SIGNED:  Marcellus Scott, MD,  FACP, FHM, [facility], [facility], [facility]   [facility] Physician Advisor [place]     To contact the attending provider between 7A-7P or the covering provider during after hours 7P-7A, please log into the web site [email or web address] and access using universal [place] password for that web site. If you do not have the password, please call the hospital operator.

## 2022-08-19 NOTE — Progress Notes (Signed)
Pt discharging with family.  Teaching done and written information given.  All questions answered.

## 2022-08-19 NOTE — Progress Notes (Signed)
Hypoglycemic Event  CBG: 56  Treatment: 4 oz juice/soda  Symptoms: None  Follow-up CBG: Time:08:31 CBG Result:112  Possible Reasons for Event: Medication regimen:    Comments/MD notified:Dr. Hongalgi and diabetes coordinator made aware.   [name]

## 2022-08-19 NOTE — Inpatient Diabetes Management (Signed)
Inpatient Diabetes Program Recommendations  AACE/ADA: New Consensus Statement on Inpatient Glycemic Control (2015)  Target Ranges:  Prepandial:   less than 140 mg/dL      Peak postprandial:   less than 180 mg/dL (1-2 hours)      Critically ill patients:  140 - 180 mg/dL   Lab Results  Component Value Date   GLUCAP 112 (H) 08/19/2022   HGBA1C 7.0 (H) 08/16/2022    Review of Glycemic Control  Latest Reference Range & Units 08/18/22 21:14 08/19/22 02:04 08/19/22 02:26 08/19/22 03:18 08/19/22 07:19  Glucose-Capillary 70 - 99 mg/dL 161 (H) 59 (L) 76 86 56 (L)   Diabetes history: DM 2 Outpatient Diabetes medications:  Medtronic insulin pump- Basal - 3.2 units/H Bolus - Max 25 units   Current orders for Inpatient glycemic control:  Insulin pump  Inpatient Diabetes Program Recommendations:    Spoke with patient at bedside.  She is having lows overnight with insulin pump.  Encouraged her to call Wynne Dust, PharmD at Dr. Lanell Matar office to get pump adjustments as it seems she may need reduction in basal rates overnight.  Patient appreciative of information.  She is currently using FS Libre 14 day for monitoring.  She is interested in [facility] 3- Will give her sample at discharge to try (Okay's by Dr. Waymon Amato).  Note plans for discharge today.    Thanks,  Beryl Meager, RN, BC-ADM Inpatient Diabetes Coordinator Pager [phone]  (8a-5p)

## 2022-08-22 ENCOUNTER — Encounter: Payer: Medicare Other | Admitting: Podiatry

## 2022-08-23 NOTE — Progress Notes (Signed)
This encounter was created in error - please disregard.

## 2022-08-24 ENCOUNTER — Ambulatory Visit: Payer: Medicare Other

## 2022-08-29 ENCOUNTER — Ambulatory Visit (INDEPENDENT_AMBULATORY_CARE_PROVIDER_SITE_OTHER): Payer: Medicare Other | Admitting: Podiatry

## 2022-08-29 DIAGNOSIS — H43823 Vitreomacular adhesion, bilateral: Secondary | ICD-10-CM | POA: Diagnosis not present

## 2022-08-29 DIAGNOSIS — L97522 Non-pressure chronic ulcer of other part of left foot with fat layer exposed: Secondary | ICD-10-CM | POA: Diagnosis not present

## 2022-08-29 DIAGNOSIS — E113492 Type 2 diabetes mellitus with severe nonproliferative diabetic retinopathy without macular edema, left eye: Secondary | ICD-10-CM | POA: Diagnosis not present

## 2022-08-29 DIAGNOSIS — E113411 Type 2 diabetes mellitus with severe nonproliferative diabetic retinopathy with macular edema, right eye: Secondary | ICD-10-CM | POA: Diagnosis not present

## 2022-08-29 DIAGNOSIS — H43811 Vitreous degeneration, right eye: Secondary | ICD-10-CM | POA: Diagnosis not present

## 2022-08-29 MED ORDER — GENTAMICIN SULFATE 0.1 % EX CREA: 1.0000 | TOPICAL_CREAM | Freq: Two times a day (BID) | CUTANEOUS | 1 refills | Status: DC

## 2022-08-29 MED ORDER — SULFAMETHOXAZOLE-TRIMETHOPRIM 800-160 MG PO TABS: 1.0000 | ORAL_TABLET | Freq: Two times a day (BID) | ORAL | 0 refills | Status: DC

## 2022-08-29 NOTE — Progress Notes (Signed)
Chief Complaint  Patient presents with   Routine Post Op    Patient came in today for POV# 2, amputation 4th toe left, and left paronychia hallux, swelling, [id number] shoe,     Subjective:  Patient presents today status post fourth toe amputation left foot.  DOS: 08/18/2022.  Patient doing well.  WBAT surgical shoe.  She is still taking the ciprofloxacin and Bactrim DS as prescribed.  No new complaints  Past Medical History:  Diagnosis Date   Anxiety    Arthritis    knees   Coronary artery disease    Depression    Fatty liver    History of nuclear stress test 12/16/2011   exercise myoview; normal images with 2-66mm ST-segment depression - subsequent cath revelaed subtotally occluded small 2nd marginal branch & 75% PDA lesion, normal LV function   Hypertension    Hypothyroidism    Neuropathy    in feet   OSA on CPAP    AHI = 44 (per patient) setting 13 per pt   Personal history of kidney stones    Primary localized osteoarthritis of left knee 04/24/2015   Primary localized osteoarthritis of right knee 07/02/2015   Tobacco abuse    Type 2 diabetes mellitus (HCC)    insulin pump    Past Surgical History:  Procedure Laterality Date   3rd right toe amputation  02/2016   AMPUTATION Right 12/22/2014   Procedure: RIGHT 2ND TOE AMPUTATION;  Surgeon: Toni Arthurs, MD;  [facility];  Service: Orthopedics;  Laterality: Right;   AMPUTATION TOE Left 08/18/2022   Procedure: AMPUTATION TOE;  Surgeon: Felecia Shelling, DPM;  [facility];  Service: Podiatry;  Laterality: Left;   AUGMENTATION MAMMAPLASTY Bilateral    BREAST ENHANCEMENT SURGERY Bilateral    CARDIAC CATHETERIZATION  01/04/2012   subtotally occluded small 2nd marginal branch & 75% PDA lesion, normal LV function   CATARACT EXTRACTION Bilateral    with lens implants   CHOLECYSTECTOMY  2008   COLONOSCOPY     COLONOSCOPY WITH PROPOFOL N/A 05/12/2016   Procedure: COLONOSCOPY WITH PROPOFOL;  Surgeon: Charna Elizabeth, MD;   [facility];  Service: Endoscopy;  Laterality: N/A;   I & D KNEE WITH POLY EXCHANGE Left 05/13/2015   Procedure: IRRIGATION AND DEBRIDEMENT KNEE WITH POLY EXCHANGE;  Surgeon: Teryl Lucy, MD;  [facility];  Service: Orthopedics;  Laterality: Left;   LEFT HEART CATHETERIZATION WITH CORONARY ANGIOGRAM N/A 01/04/2012   Procedure: LEFT HEART CATHETERIZATION WITH CORONARY ANGIOGRAM;  Surgeon: Runell Gess, MD;  Location: [facility] CATH LAB;  Service: Cardiovascular;  Laterality: N/A;   PARTIAL KNEE ARTHROPLASTY Left 04/24/2015   Procedure: LEFT UNICOMPARTMENTAL KNEE ARTHROPLASTY ;  Surgeon: Teryl Lucy, MD;  Location: [facility];  Service: Orthopedics;  Laterality: Left;   PARTIAL KNEE ARTHROPLASTY Right 07/02/2015   Procedure: RIGHT UNI KNEE ARTHROPLASTY;  Surgeon: Teryl Lucy, MD;  Location: [facility];  Service: Orthopedics;  Laterality: Right;  ANESTHESIA:  GENERAL, PRE/POST OP FEMORAL NERVE   POSTERIOR CERVICAL FUSION/FORAMINOTOMY  1990   ROBOTIC ASSISTED TOTAL HYSTERECTOMY WITH BILATERAL SALPINGO OOPHERECTOMY Bilateral 02/17/2015   Procedure: ROBOTIC ASSISTED TOTAL HYSTERECTOMY WITH BILATERAL SALPINGO OOPHORECTOMY;  Surgeon: Adolphus Birchwood, MD;  [facility];  Service: Gynecology;  Laterality: Bilateral;    Allergies  Allergen Reactions   Penicillins Hives    Did it involve swelling of the face/tongue/throat, SOB, or low BP? No Did it involve sudden or severe rash/hives, skin peeling, or  any reaction on the inside of your mouth or nose? Yes Did you need to seek medical attention at a hospital or doctor's office? No When did it last happen?  childhood     If all above answers are "NO", may proceed with cephalosporin use.      Objective/Physical Exam Neurovascular status intact.  Incision well coapted with sutures intact.  There are some slight maceration around the amputation site but overall sutures are intact and there is well coapted with no  dehiscence.  No drainage.  There continues to be some mild localized erythema around the amputation site  Assessment: 1. s/p amputation left fourth toe. DOS: 08/18/2022   Plan of Care:  -Patient was evaluated.  -Refill Bactrim DS.  Finish the ciprofloxacin 500 mg until completed -Continue WBAT surgical shoe -Prescription for gentamicin cream to apply to the amputation site daily -Return to clinic 10 days possible suture removal and follow-up x-ray  Felecia Shelling, DPM [facility]  Dr. Felecia Shelling, DPM    [address] [place], [place] [address]                Office [phone]  Fax [phone]

## 2022-08-30 DIAGNOSIS — N179 Acute kidney failure, unspecified: Secondary | ICD-10-CM | POA: Diagnosis not present

## 2022-08-30 DIAGNOSIS — G4733 Obstructive sleep apnea (adult) (pediatric): Secondary | ICD-10-CM | POA: Diagnosis not present

## 2022-08-30 DIAGNOSIS — E1129 Type 2 diabetes mellitus with other diabetic kidney complication: Secondary | ICD-10-CM | POA: Diagnosis not present

## 2022-08-30 DIAGNOSIS — I10 Essential (primary) hypertension: Secondary | ICD-10-CM | POA: Diagnosis not present

## 2022-08-30 DIAGNOSIS — E039 Hypothyroidism, unspecified: Secondary | ICD-10-CM | POA: Diagnosis not present

## 2022-08-30 DIAGNOSIS — F17200 Nicotine dependence, unspecified, uncomplicated: Secondary | ICD-10-CM | POA: Diagnosis not present

## 2022-08-30 DIAGNOSIS — M869 Osteomyelitis, unspecified: Secondary | ICD-10-CM | POA: Diagnosis not present

## 2022-08-30 DIAGNOSIS — M25552 Pain in left hip: Secondary | ICD-10-CM | POA: Diagnosis not present

## 2022-08-30 DIAGNOSIS — Z794 Long term (current) use of insulin: Secondary | ICD-10-CM | POA: Diagnosis not present

## 2022-08-30 DIAGNOSIS — D72829 Elevated white blood cell count, unspecified: Secondary | ICD-10-CM | POA: Diagnosis not present

## 2022-08-30 DIAGNOSIS — R2681 Unsteadiness on feet: Secondary | ICD-10-CM | POA: Diagnosis not present

## 2022-09-01 ENCOUNTER — Ambulatory Visit (INDEPENDENT_AMBULATORY_CARE_PROVIDER_SITE_OTHER): Payer: Medicare Other | Admitting: Licensed Clinical Social Worker

## 2022-09-01 DIAGNOSIS — F418 Other specified anxiety disorders: Secondary | ICD-10-CM | POA: Diagnosis not present

## 2022-09-01 DIAGNOSIS — F331 Major depressive disorder, recurrent, moderate: Secondary | ICD-10-CM

## 2022-09-01 NOTE — Progress Notes (Signed)
THERAPIST PROGRESS NOTE  Session Time: 9-10a  Pt provided consent for her husband, Latanya Hemmer, to be present throughout session.   Behavioral Health Outpatient [facility] in office visit for patient and LCSW clinician  Participation Level: Active  Behavioral Response: NAAlertDepressed  Type of Therapy: Individual Therapy  Treatment Goals addressed: Develop healthy thinking patterns and beliefs about self, others, and the world that lead to the alleviation and help prevent the relapse of depression per self report 3 out of 5 sessions documented    ProgressTowards Goals: Progressing  Interventions: Motivational Interviewing, Strength-based, Supportive, and Reframing  Summary: [name] is a 67 y.o. female who presents with continuing symptoms associated with depression diagnosis.  Patient reports that she is compliant with her medication.   Explored current levels of mood and overall energy. Discussed depression symptoms and medication compliance. Used motivational interviewing techniques to encourage pt to increase activity through the day, focus on overall prosocial behaviors, and engage in cognitively stimulating activities. Drafted hard copy action plan for pt to take home with her. Discussed cognitive distortions, automatic negative thoughts, and discussed using positive self talk and reframing.   Pt wanted husband to be in session to give him the opportunity to see their relationship from his perspective. Pts husband states that he feels that his wife is an extremely kind, sweet person that prioritizes taking care of everyone else with the exception of herself. Allowed pt husband to use specific examples of incidents that have happened and consequences of the behaviors.  Pt identified "sleeping" and "eating" as coping mechanisms when she is upset. Discussed behavior modifications and assisted pt in identifying and methods of employing different coping skills.   Pt did  become upset and dismissed husband in the middle of session.   Continued recommendations are as follows: self care behaviors, positive social engagements, focusing on overall work/home/life balance, and focusing on positive physical and emotional wellness.   Suicidal/Homicidal: No  Therapist Response: Pt is continuing to apply interventions learned in session into daily life situations. Pt is currently on track to meet goals utilizing interventions mentioned above. Personal growth and progress noted. Treatment to continue as indicated.   Patient feels that she is trying harder overall to improve self-care and life management.  Developments continue in the areas of family and relational functioning.  Plan: Informed patient that clinician will be leaving outpatient department. Allowed pt to explore any questions or concerns and discussed future counseling options/resources. Provided pt with psychoeducational resources and list of OPT therapists. Encouraged pt to continue with psychiatric med management appointments, if applicable.   Diagnosis:  Encounter Diagnoses  Name Primary?   MDD (major depressive disorder), recurrent episode, moderate (HCC) Yes   Situational anxiety    Collaboration of Care: Other pt to continue follow ups with PCP [facility] and Dr. Edison [place] at [facility]  Patient/Guardian was advised Release of Information must be obtained prior to any record release in order to collaborate their care with an outside provider. Patient/Guardian was advised if they have not already done so to contact the registration department to sign all necessary forms in order for [place] to release information regarding their care.   Consent: Patient/Guardian gives verbal consent for treatment and assignment of benefits for services provided during this visit. Patient/Guardian expressed understanding and agreed to proceed  Portions of this report may  have been transcribed using voice recognition software. Every effort was made to ensure accuracy; however, inadvertent computerized transcription errors  may be present .   Ernest Haber Temekia Caskey, LCSW 09/01/2022

## 2022-09-01 NOTE — Patient Instructions (Signed)
Outpatient Psychiatry and Counseling  FOR CRISIS:  call 911, [facility]: Mobile Crisis Management 24 hours:  [phone], call [phone], [facility] ([facility]) [address] walk in, or go to your local EMERGENCY DEPARTMENT  [facility] [address], [place], [place] [address]  [phone]  [facility] [address] [place], [place] [address] [phone]  [facility] Address: [address] [address], [place], [place] [address] Phone: [phone]  [facility] ([place] and [place] Locations) [email or web address]  [facility] sliding scale fee and walk in schedule: M-F 8am-12pm/1pm-3pm [address]  [place], [place] [address] [phone]  [facility] [address] [place], [place] [address] [phone]  [facility]/ Intensive Outpatient Therapy Program/CDIOP/PHP [address]  [place], [place] [address] [phone]  [facility]                  Crisis Services, Outpatient Therapy Services, Walk in Services      [phone]     [address]    [place], [place] [address]                 [facility]   [facility] [phone] [address] [place], [place] [address]  [facility]          [address],  [place], [place] [address]       [phone]  [facility] [address] [place], [place] [address] [phone]  [facility]    [address]    [place], [place] [address]     [phone]       [facility] [address] [place] [address]  [facility]     [address]     [place], [place]      [phone]       [facility] Shamsher Ahluwalia, MD [address]  [address] [place], [place] [address] [phone]  [facility]     [address]     [place], [place] [address]     [phone]       [facility] [address] [place],  [address] [phone] Resources for Temporary Residential Assistance/Crisis Centers  

## 2022-09-07 ENCOUNTER — Ambulatory Visit (INDEPENDENT_AMBULATORY_CARE_PROVIDER_SITE_OTHER): Payer: Medicare Other | Admitting: Podiatry

## 2022-09-07 ENCOUNTER — Ambulatory Visit: Payer: Medicare Other

## 2022-09-07 ENCOUNTER — Ambulatory Visit (INDEPENDENT_AMBULATORY_CARE_PROVIDER_SITE_OTHER): Payer: Medicare Other

## 2022-09-07 DIAGNOSIS — L97522 Non-pressure chronic ulcer of other part of left foot with fat layer exposed: Secondary | ICD-10-CM

## 2022-09-07 DIAGNOSIS — L03032 Cellulitis of left toe: Secondary | ICD-10-CM

## 2022-09-07 NOTE — Progress Notes (Signed)
No chief complaint on file.   Subjective:  Patient presents today status post fourth toe amputation left foot.  DOS: 08/18/2022.  Patient doing well.  WBAT surgical shoe.  She is still taking the ciprofloxacin and Bactrim DS as prescribed.  No new complaints  Past Medical History:  Diagnosis Date   Anxiety    Arthritis    knees   Coronary artery disease    Depression    Fatty liver    History of nuclear stress test 12/16/2011   exercise myoview; normal images with 2-38mm ST-segment depression - subsequent cath revelaed subtotally occluded small 2nd marginal branch & 75% PDA lesion, normal LV function   Hypertension    Hypothyroidism    Neuropathy    in feet   OSA on CPAP    AHI = 44 (per patient) setting 13 per pt   Personal history of kidney stones    Primary localized osteoarthritis of left knee 04/24/2015   Primary localized osteoarthritis of right knee 07/02/2015   Tobacco abuse    Type 2 diabetes mellitus (HCC)    insulin pump    Past Surgical History:  Procedure Laterality Date   3rd right toe amputation  02/2016   AMPUTATION Right 12/22/2014   Procedure: RIGHT 2ND TOE AMPUTATION;  Surgeon: Toni Arthurs, MD;  [facility];  Service: Orthopedics;  Laterality: Right;   AMPUTATION TOE Left 08/18/2022   Procedure: AMPUTATION TOE;  Surgeon: Felecia Shelling, DPM;  [facility];  Service: Podiatry;  Laterality: Left;   AUGMENTATION MAMMAPLASTY Bilateral    BREAST ENHANCEMENT SURGERY Bilateral    CARDIAC CATHETERIZATION  01/04/2012   subtotally occluded small 2nd marginal branch & 75% PDA lesion, normal LV function   CATARACT EXTRACTION Bilateral    with lens implants   CHOLECYSTECTOMY  2008   COLONOSCOPY     COLONOSCOPY WITH PROPOFOL N/A 05/12/2016   Procedure: COLONOSCOPY WITH PROPOFOL;  Surgeon: Charna Elizabeth, MD;  [facility];  Service: Endoscopy;  Laterality: N/A;   I & D KNEE WITH POLY EXCHANGE Left 05/13/2015   Procedure: IRRIGATION AND DEBRIDEMENT KNEE  WITH POLY EXCHANGE;  Surgeon: Teryl Lucy, MD;  [facility];  Service: Orthopedics;  Laterality: Left;   LEFT HEART CATHETERIZATION WITH CORONARY ANGIOGRAM N/A 01/04/2012   Procedure: LEFT HEART CATHETERIZATION WITH CORONARY ANGIOGRAM;  Surgeon: Runell Gess, MD;  Location: [facility] CATH LAB;  Service: Cardiovascular;  Laterality: N/A;   PARTIAL KNEE ARTHROPLASTY Left 04/24/2015   Procedure: LEFT UNICOMPARTMENTAL KNEE ARTHROPLASTY ;  Surgeon: Teryl Lucy, MD;  Location: [facility];  Service: Orthopedics;  Laterality: Left;   PARTIAL KNEE ARTHROPLASTY Right 07/02/2015   Procedure: RIGHT UNI KNEE ARTHROPLASTY;  Surgeon: Teryl Lucy, MD;  Location: [facility];  Service: Orthopedics;  Laterality: Right;  ANESTHESIA:  GENERAL, PRE/POST OP FEMORAL NERVE   POSTERIOR CERVICAL FUSION/FORAMINOTOMY  1990   ROBOTIC ASSISTED TOTAL HYSTERECTOMY WITH BILATERAL SALPINGO OOPHERECTOMY Bilateral 02/17/2015   Procedure: ROBOTIC ASSISTED TOTAL HYSTERECTOMY WITH BILATERAL SALPINGO OOPHORECTOMY;  Surgeon: Adolphus Birchwood, MD;  [facility];  Service: Gynecology;  Laterality: Bilateral;    Allergies  Allergen Reactions   Penicillins Hives    Did it involve swelling of the face/tongue/throat, SOB, or low BP? No Did it involve sudden or severe rash/hives, skin peeling, or any reaction on the inside of your mouth or nose? Yes Did you need to seek medical attention at a hospital or doctor's office? No When did it last  happen?  childhood     If all above answers are "NO", may proceed with cephalosporin use.      Objective/Physical Exam Neurovascular status intact.  After removal of the sutures today there is some slight scab around the incision site.  Overall appears very stable.  No purulence.  No malodor.  No significant drainage.  Improved but persistent mild localized erythema around the amputation site  Radiographic exam LT foot 09/07/2022: Interval resection of the fourth toe of  the left foot at the level of the MTP.  Normal osseous mineralization.  No fractures or irregularities noted.  Assessment: 1. s/p amputation left fourth toe. DOS: 08/18/2022   Plan of Care:  -Patient was evaluated.  Sutures removed - Continue Bactrim DS until completed -Continue WBAT surgical shoe -Return to clinic 2-3 weeks follow-up  Felecia Shelling, DPM [facility]  Dr. Felecia Shelling, DPM    [address] [place], [place] [address]                Office [phone]  Fax [phone]

## 2022-09-12 DIAGNOSIS — M533 Sacrococcygeal disorders, not elsewhere classified: Secondary | ICD-10-CM | POA: Diagnosis not present

## 2022-09-13 ENCOUNTER — Telehealth: Payer: Self-pay | Admitting: Podiatry

## 2022-09-13 NOTE — Telephone Encounter (Signed)
Pt what's to know if she needs more medication her toe and the top of her foot is still red. She also wants to know if she can get in a swimming pool.[phone] she would like a call back

## 2022-09-14 ENCOUNTER — Ambulatory Visit (INDEPENDENT_AMBULATORY_CARE_PROVIDER_SITE_OTHER): Payer: Medicare Other | Admitting: Physical Therapy

## 2022-09-14 DIAGNOSIS — M25552 Pain in left hip: Secondary | ICD-10-CM | POA: Diagnosis not present

## 2022-09-14 NOTE — Therapy (Addendum)
 OUTPATIENT PHYSICAL THERAPY LOWER EXTREMITY EVALUATION   Patient Name: [name] MRN: [id number] DOB:1955/03/04, 67 y.o., female Today's Date: 09/14/2022  END OF SESSION:  PT End of Session - 09/16/22 1319     Visit Number 1    Number of Visits 16    Date for PT Re-Evaluation 11/09/22    Authorization Type Medicare    PT Start Time 1103    PT Stop Time 1135    PT Time Calculation (min) 32 min    Activity Tolerance Patient tolerated treatment well    Behavior During Therapy WFL for tasks assessed/performed             Past Medical History:  Diagnosis Date   Anxiety    Arthritis    knees   Coronary artery disease    Depression    Fatty liver    History of nuclear stress test 12/16/2011   exercise myoview; normal images with 2-63mm ST-segment depression - subsequent cath revelaed subtotally occluded small 2nd marginal branch & 75% PDA lesion, normal LV function   Hypertension    Hypothyroidism    Neuropathy    in feet   OSA on CPAP    AHI = 44 (per patient) setting 13 per pt   Personal history of kidney stones    Primary localized osteoarthritis of left knee 04/24/2015   Primary localized osteoarthritis of right knee 07/02/2015   Tobacco abuse    Type 2 diabetes mellitus (HCC)    insulin  pump   Past Surgical History:  Procedure Laterality Date   3rd right toe amputation  02/2016   AMPUTATION Right 12/22/2014   Procedure: RIGHT 2ND TOE AMPUTATION;  Surgeon: Amada Backer, MD;  Location: [facility];  Service: Orthopedics;  Laterality: Right;   AMPUTATION TOE Left 08/18/2022   Procedure: AMPUTATION TOE;  Surgeon: Dot Gazella, DPM;  Location: [facility];  Service: Podiatry;  Laterality: Left;   AUGMENTATION MAMMAPLASTY Bilateral    BREAST ENHANCEMENT SURGERY Bilateral    CARDIAC CATHETERIZATION  01/04/2012   subtotally occluded small 2nd marginal branch & 75% PDA lesion, normal LV function   CATARACT EXTRACTION Bilateral    with lens implants   CHOLECYSTECTOMY   2008   COLONOSCOPY     COLONOSCOPY WITH PROPOFOL  N/A 05/12/2016   Procedure: COLONOSCOPY WITH PROPOFOL ;  Surgeon: Tami Falcon, MD;  Location: [facility];  Service: Endoscopy;  Laterality: N/A;   I & D KNEE WITH POLY EXCHANGE Left 05/13/2015   Procedure: IRRIGATION AND DEBRIDEMENT KNEE WITH POLY EXCHANGE;  Surgeon: Osa Blase, MD;  Location: [facility];  Service: Orthopedics;  Laterality: Left;   LEFT HEART CATHETERIZATION WITH CORONARY ANGIOGRAM N/A 01/04/2012   Procedure: LEFT HEART CATHETERIZATION WITH CORONARY ANGIOGRAM;  Surgeon: Avanell Leigh, MD;  Location: [facility] CATH LAB;  Service: Cardiovascular;  Laterality: N/A;   PARTIAL KNEE ARTHROPLASTY Left 04/24/2015   Procedure: LEFT UNICOMPARTMENTAL KNEE ARTHROPLASTY ;  Surgeon: Osa Blase, MD;  Location: [facility];  Service: Orthopedics;  Laterality: Left;   PARTIAL KNEE ARTHROPLASTY Right 07/02/2015   Procedure: RIGHT UNI KNEE ARTHROPLASTY;  Surgeon: Osa Blase, MD;  Location: [facility];  Service: Orthopedics;  Laterality: Right;  ANESTHESIA:  GENERAL, PRE/POST OP FEMORAL NERVE   POSTERIOR CERVICAL FUSION/FORAMINOTOMY  1990   ROBOTIC ASSISTED TOTAL HYSTERECTOMY WITH BILATERAL SALPINGO OOPHERECTOMY Bilateral 02/17/2015   Procedure: ROBOTIC ASSISTED TOTAL HYSTERECTOMY WITH BILATERAL SALPINGO OOPHORECTOMY;  Surgeon: Alphonso Aschoff, MD;  Location: [facility];  Service: Gynecology;  Laterality: Bilateral;   Patient Active Problem List   Diagnosis Date Noted   Renal insufficiency 08/17/2022   Diabetic infection of left foot (HCC) 08/16/2022   Vitreomacular adhesion of both eyes 09/02/2021   Hypertensive retinopathy of both eyes, grade 2 09/02/2021   Severe nonproliferative diabetic retinopathy of left eye (HCC) 04/22/2021   Severe nonproliferative diabetic retinopathy of right eye, with macular edema, associated with type 2 diabetes mellitus (HCC) 04/22/2021   Abnormal gait 11/04/2020   Acute stress reaction 11/04/2020    Osteoporosis 02/18/2020   Fatigue 06/24/2019   Abnormal levels of other serum enzymes 09/25/2017   Absence of toe (HCC) 09/06/2017   Hypertrophic condition of skin 09/06/2017   Long term (current) use of insulin  (HCC) 12/22/2016   Diarrhea 11/12/2016   AKI (acute kidney injury) (HCC) 11/12/2016   Abnormal urine 10/14/2016   Sepsis (HCC) 10/06/2016   Diabetes mellitus due to underlying condition, uncontrolled 10/06/2016   Encounter for general adult medical examination without abnormal findings 08/18/2015   Primary localized osteoarthritis of right knee 07/02/2015   S/P right unicompartmental knee replacement 07/02/2015   Infection of prosthetic left knee joint (HCC) 05/13/2015   Primary localized osteoarthritis of left knee 04/24/2015   S/P left unicompartmental knee replacement 04/24/2015   Hyperlipidemia 04/03/2015   Pelvic mass in female 02/17/2015   Mass, ovarian 02/09/2015   Hereditary and idiopathic neuropathy, unspecified 11/20/2014   Callosity 12/02/2013   CAD (coronary artery disease) 11/05/2012   Obesity 11/05/2012   Tobacco abuse 11/05/2012   OSA on CPAP 11/05/2012   HTN (hypertension) 11/05/2012   DM (diabetes mellitus) type 2, uncontrolled, with ketoacidosis (HCC) 11/05/2012   Diabetic peripheral neuropathy associated with type 2 diabetes mellitus (HCC) 01/06/2011   Hyperglycemia due to type 2 diabetes mellitus (HCC) 01/06/2011   Disorder of lung 06/25/2010   Hypothyroidism 10/17/2008   Major depression, single episode 10/17/2008   Polyneuropathy 10/17/2008     PCP: Suan Elm  REFERRING PROVIDER: Suan Elm  REFERRING DIAG: L hip pain, weakness   THERAPY DIAG:  Pain in left hip  Rationale for Evaluation and Treatment: Rehabilitation  ONSET DATE:    SUBJECTIVE:   SUBJECTIVE STATEMENT: Pt reports Left hip pain, increased pain with Standing/walking 15 min,   Has had wounds on bottoms of R foot - had boot.   R foot:  now healed.  2 and 3  on R foot amuputed   L foot: wearing surgical shoe. 4 th toe amputated  no pain, not healed yet.  No feeling in either feet. Osteomyelitis,  No AD Bad balance, has fallen  Exercise: used to walk.   but stopped due to feet.  [facility] [employer] [facility]   PERTINENT HISTORY: DM with insulin  pump, 3 toes amputated, OA, Bil TKA,    PAIN:  Are you having pain? Yes: NPRS scale: up to 10/10 Pain location: L hip  Pain description: sore, tender, weak Aggravating factors: standing, walking  Relieving factors: none stated   PRECAUTIONS: Fall  WEIGHT BEARING RESTRICTIONS: No  FALLS:  Has patient fallen in last 6 months?  Yes, 4 times,   PLOF: Independent  PATIENT GOALS:  Decreased pain   NEXT MD VISIT:   OBJECTIVE:   DIAGNOSTIC FINDINGS:   PATIENT SURVEYS:    COGNITION: Overall cognitive status: Within functional limits for tasks assessed     SENSATION: WFL  EDEMA:   POSTURE:       PALPATION:  tenderness at L greater trochanter  LOWER EXTREMITY ROM: Hip ROM: WFL Knees: WFL  LOWER EXTREMITY MMT:  MMT Left eval Right  eval  Hip flexion 4 4+  Hip extension    Hip abduction 4 4+  Hip adduction    Hip internal rotation    Hip external rotation    Knee flexion 5 5  Knee extension 5 5  Ankle dorsiflexion    Ankle plantarflexion    Ankle inversion    Ankle eversion     (Blank rows = not tested)  LOWER EXTREMITY SPECIAL TESTS:    GAIT:    TODAY'S TREATMENT:                                                                                                                              DATE:   09/16/2022 Ther ex: see below for HEP   PATIENT EDUCATION:  Education details: PT POC, Exam findings, HEP Person educated: Patient Education method: Explanation, Demonstration, Tactile cues, Verbal cues, and Handouts Education comprehension: verbalized understanding, returned demonstration, verbal cues required, tactile cues required, and needs further  education   HOME EXERCISE PROGRAM: Access Code: [email or web address] URL: [email or web address] Date: 09/16/2022 Prepared by: Terrilee Few  Exercises - Bent Knee Fallouts  - 1-2 x daily - 1 sets - 10 reps - 3 hold - Sidelying Hip Abduction  - 1 x daily - 2 sets - 5 reps    ASSESSMENT:  CLINICAL IMPRESSION: Patient presents with primary complaint of pain in L hip, consistent with bursitis. She has increased pain with activity, weakness in hip, and decreased ability for activity. She has other deficits with gait and balance, as well as sensation in feet.   Pt with decreased ability for full functional activities. Pt will  benefit from skilled PT to improve deficits and pain and to return to PLOF.   OBJECTIVE IMPAIRMENTS: Abnormal gait, decreased activity tolerance, decreased balance, decreased knowledge of use of DME, decreased mobility, difficulty walking, decreased strength, increased muscle spasms, impaired sensation, improper body mechanics, and pain.   ACTIVITY LIMITATIONS: standing, stairs, transfers, hygiene/grooming, and locomotion level  PARTICIPATION LIMITATIONS: meal prep, cleaning, shopping, and community activity  PERSONAL FACTORS:  none  are also affecting patient's functional outcome.   REHAB POTENTIAL: Good  CLINICAL DECISION MAKING: Stable/uncomplicated  EVALUATION COMPLEXITY: Low   GOALS: Goals reviewed with patient? Yes  SHORT TERM GOALS: Target date: 09/28/2022   Pt to be independent with initial HEP  Goal status: INITIAL    LONG TERM GOALS: Target date: 11/09/2022  Pt to be independent with final HEP  Goal status: INITIAL  2.  Pt to demo improved strength of L hip to be at least 4+/5, to improve pain and stability   Goal status: INITIAL  3.  Pt to demo ability for ambulation and stairs without pain or deficit , to improve ability for community activity.   Goal status: INITIAL  4. Pt to report decreased pain to 0-2/10 in L hip overall.  PLAN:  PT FREQUENCY: 1-2x/week  PT DURATION: 8 weeks  PLANNED INTERVENTIONS: Therapeutic exercises, Therapeutic activity, Neuromuscular re-education, Patient/Family education, Self Care, Joint mobilization, Joint manipulation, Stair training, Orthotic/Fit training, DME instructions, Aquatic Therapy, Dry Needling, Electrical stimulation, Cryotherapy, Moist heat, Taping, Ultrasound, Ionotophoresis 4mg /ml Dexamethasone , Manual therapy,  Vasopneumatic device, Traction, Spinal manipulation, Spinal mobilization,Balance training, Gait training,   PLAN FOR NEXT SESSION:    Terrilee Few, PT, DPT 1:24 PM  09/16/22   PHYSICAL THERAPY DISCHARGE SUMMARY  Visits from Start of Care: 1   Plan: Patient agrees to discharge.  Patient goals were met. Patient is being discharged due to not returning since last visit.

## 2022-09-16 ENCOUNTER — Encounter: Payer: Self-pay | Admitting: Physical Therapy

## 2022-09-19 ENCOUNTER — Encounter: Payer: Self-pay | Admitting: Podiatry

## 2022-09-19 ENCOUNTER — Other Ambulatory Visit: Payer: Self-pay | Admitting: Podiatry

## 2022-09-19 MED ORDER — SULFAMETHOXAZOLE-TRIMETHOPRIM 800-160 MG PO TABS: 1.0000 | ORAL_TABLET | Freq: Two times a day (BID) | ORAL | 0 refills | Status: DC

## 2022-09-20 NOTE — Therapy (Deleted)
OUTPATIENT PHYSICAL THERAPY LOWER EXTREMITY TREATMENT   Patient Name: [name] MRN: [id number] DOB:[birth year], 67 y.o., female Today's Date: 09/14/2022  END OF SESSION:    Past Medical History:  Diagnosis Date   Anxiety    Arthritis    knees   Coronary artery disease    Depression    Fatty liver    History of nuclear stress test 12/16/2011   exercise myoview; normal images with 2-39mm ST-segment depression - subsequent cath revelaed subtotally occluded small 2nd marginal branch & 75% PDA lesion, normal LV function   Hypertension    Hypothyroidism    Neuropathy    in feet   OSA on CPAP    AHI = 44 (per patient) setting 13 per pt   Personal history of kidney stones    Primary localized osteoarthritis of left knee 04/24/2015   Primary localized osteoarthritis of right knee 07/02/2015   Tobacco abuse    Type 2 diabetes mellitus (HCC)    insulin pump   Past Surgical History:  Procedure Laterality Date   3rd right toe amputation  02/2016   AMPUTATION Right 12/22/2014   Procedure: RIGHT 2ND TOE AMPUTATION;  Surgeon: Toni Arthurs, MD;  Location: [facility];  Service: Orthopedics;  Laterality: Right;   AMPUTATION TOE Left 08/18/2022   Procedure: AMPUTATION TOE;  Surgeon: Felecia Shelling, DPM;  Location: [facility];  Service: Podiatry;  Laterality: Left;   AUGMENTATION MAMMAPLASTY Bilateral    BREAST ENHANCEMENT SURGERY Bilateral    CARDIAC CATHETERIZATION  01/04/2012   subtotally occluded small 2nd marginal branch & 75% PDA lesion, normal LV function   CATARACT EXTRACTION Bilateral    with lens implants   CHOLECYSTECTOMY  2008   COLONOSCOPY     COLONOSCOPY WITH PROPOFOL N/A 05/12/2016   Procedure: COLONOSCOPY WITH PROPOFOL;  Surgeon: Charna Elizabeth, MD;  Location: [facility];  Service: Endoscopy;  Laterality: N/A;   I & D KNEE WITH POLY EXCHANGE Left 05/13/2015   Procedure: IRRIGATION AND DEBRIDEMENT KNEE WITH POLY EXCHANGE;  Surgeon: Teryl Lucy, MD;  Location: [facility];  Service:  Orthopedics;  Laterality: Left;   LEFT HEART CATHETERIZATION WITH CORONARY ANGIOGRAM N/A 01/04/2012   Procedure: LEFT HEART CATHETERIZATION WITH CORONARY ANGIOGRAM;  Surgeon: Runell Gess, MD;  Location: [facility] CATH LAB;  Service: Cardiovascular;  Laterality: N/A;   PARTIAL KNEE ARTHROPLASTY Left 04/24/2015   Procedure: LEFT UNICOMPARTMENTAL KNEE ARTHROPLASTY ;  Surgeon: Teryl Lucy, MD;  Location: [facility];  Service: Orthopedics;  Laterality: Left;   PARTIAL KNEE ARTHROPLASTY Right 07/02/2015   Procedure: RIGHT UNI KNEE ARTHROPLASTY;  Surgeon: Teryl Lucy, MD;  Location: [facility];  Service: Orthopedics;  Laterality: Right;  ANESTHESIA:  GENERAL, PRE/POST OP FEMORAL NERVE   POSTERIOR CERVICAL FUSION/FORAMINOTOMY  1990   ROBOTIC ASSISTED TOTAL HYSTERECTOMY WITH BILATERAL SALPINGO OOPHERECTOMY Bilateral 02/17/2015   Procedure: ROBOTIC ASSISTED TOTAL HYSTERECTOMY WITH BILATERAL SALPINGO OOPHORECTOMY;  Surgeon: Adolphus Birchwood, MD;  Location: [facility];  Service: Gynecology;  Laterality: Bilateral;   Patient Active Problem List   Diagnosis Date Noted   Renal insufficiency 08/17/2022   Diabetic infection of left foot (HCC) 08/16/2022   Vitreomacular adhesion of both eyes 09/02/2021   Hypertensive retinopathy of both eyes, grade 2 09/02/2021   Severe nonproliferative diabetic retinopathy of left eye (HCC) 04/22/2021   Severe nonproliferative diabetic retinopathy of right eye, with macular edema, associated with type 2 diabetes mellitus (HCC) 04/22/2021   Abnormal gait 11/04/2020   Acute stress reaction 11/04/2020  Osteoporosis 02/18/2020   Fatigue 06/24/2019   Abnormal levels of other serum enzymes 09/25/2017   Absence of toe (HCC) 09/06/2017   Hypertrophic condition of skin 09/06/2017   Long term (current) use of insulin (HCC) 12/22/2016   Diarrhea 11/12/2016   AKI (acute kidney injury) (HCC) 11/12/2016   Abnormal urine 10/14/2016   Sepsis (HCC) 10/06/2016    Diabetes mellitus due to underlying condition, uncontrolled 10/06/2016   Encounter for general adult medical examination without abnormal findings 08/18/2015   Primary localized osteoarthritis of right knee 07/02/2015   S/P right unicompartmental knee replacement 07/02/2015   Infection of prosthetic left knee joint (HCC) 05/13/2015   Primary localized osteoarthritis of left knee 04/24/2015   S/P left unicompartmental knee replacement 04/24/2015   Hyperlipidemia 04/03/2015   Pelvic mass in female 02/17/2015   Mass, ovarian 02/09/2015   Hereditary and idiopathic neuropathy, unspecified 11/20/2014   Callosity 12/02/2013   CAD (coronary artery disease) 11/05/2012   Obesity 11/05/2012   Tobacco abuse 11/05/2012   OSA on CPAP 11/05/2012   HTN (hypertension) 11/05/2012   DM (diabetes mellitus) type 2, uncontrolled, with ketoacidosis (HCC) 11/05/2012   Diabetic peripheral neuropathy associated with type 2 diabetes mellitus (HCC) 01/06/2011   Hyperglycemia due to type 2 diabetes mellitus (HCC) 01/06/2011   Disorder of lung 06/25/2010   Hypothyroidism 10/17/2008   Major depression, single episode 10/17/2008   Polyneuropathy 10/17/2008     PCP: Geoffry Paradise  REFERRING PROVIDER: Geoffry Paradise  REFERRING DIAG: L hip pain, weakness   THERAPY DIAG:  No diagnosis found.  Rationale for Evaluation and Treatment: Rehabilitation  ONSET DATE:    SUBJECTIVE:   SUBJECTIVE STATEMENT: Pt reports Left hip pain, increased pain with Standing/walking 15 min,   Has had wounds on bottoms of R foot - had boot.   R foot:  now healed.  2 and 3 on R foot amuputed   L foot: wearing surgical shoe. 4 th toe amputated  no pain, not healed yet.  No feeling in either feet. Osteomyelitis,  No AD Bad balance, has fallen  Exercise: used to walk.   but stopped due to feet.  [facility] [employer] .   PERTINENT HISTORY: DM with insulin pump, 3 toes amputated, OA, Bil TKA,    PAIN:  Are you having  pain? Yes: NPRS scale: up to 10/10 Pain location: L hip  Pain description: sore, tender, weak Aggravating factors: standing, walking  Relieving factors: none stated   PRECAUTIONS: Fall  WEIGHT BEARING RESTRICTIONS: No  FALLS:  Has patient fallen in last 6 months?  Yes, 4 times,   PLOF: Independent  PATIENT GOALS:  Decreased pain   NEXT MD VISIT:   OBJECTIVE:   DIAGNOSTIC FINDINGS:   PATIENT SURVEYS:    COGNITION: Overall cognitive status: Within functional limits for tasks assessed     SENSATION: WFL  EDEMA:   POSTURE:       PALPATION:  tenderness at L greater trochanter   LOWER EXTREMITY ROM: Hip ROM: WFL Knees: WFL  LOWER EXTREMITY MMT:  MMT Left eval Right  eval  Hip flexion 4 4+  Hip extension    Hip abduction 4 4+  Hip adduction    Hip internal rotation    Hip external rotation    Knee flexion 5 5  Knee extension 5 5  Ankle dorsiflexion    Ankle plantarflexion    Ankle inversion    Ankle eversion     (Blank rows = not tested)  LOWER EXTREMITY SPECIAL TESTS:    GAIT:    TODAY'S TREATMENT:                                                                                                                              DATE:   09/20/2022 Ther ex: see below for HEP   PATIENT EDUCATION:  Education details: PT POC, Exam findings, HEP Person educated: Patient Education method: Explanation, Demonstration, Tactile cues, Verbal cues, and Handouts Education comprehension: verbalized understanding, returned demonstration, verbal cues required, tactile cues required, and needs further education   HOME EXERCISE PROGRAM: Access Code: [email or web address] URL: [email or web address] Date: 09/16/2022 Prepared by: Sedalia Muta  Exercises - Bent Knee Fallouts  - 1-2 x daily - 1 sets - 10 reps - 3 hold - Sidelying Hip Abduction  - 1 x daily - 2 sets - 5 reps    ASSESSMENT:  CLINICAL IMPRESSION: Patient presents with primary complaint of  pain in L hip, consistent with bursitis. She has increased pain with activity, weakness in hip, and decreased ability for activity. She has other deficits with gait and balance, as well as sensation in feet.   Pt with decreased ability for full functional activities. Pt will  benefit from skilled PT to improve deficits and pain and to return to PLOF.   OBJECTIVE IMPAIRMENTS: Abnormal gait, decreased activity tolerance, decreased balance, decreased knowledge of use of DME, decreased mobility, difficulty walking, decreased strength, increased muscle spasms, impaired sensation, improper body mechanics, and pain.   ACTIVITY LIMITATIONS: standing, stairs, transfers, hygiene/grooming, and locomotion level  PARTICIPATION LIMITATIONS: meal prep, cleaning, shopping, and community activity  PERSONAL FACTORS:  none  are also affecting patient's functional outcome.   REHAB POTENTIAL: Good  CLINICAL DECISION MAKING: Stable/uncomplicated  EVALUATION COMPLEXITY: Low   GOALS: Goals reviewed with patient? Yes  SHORT TERM GOALS: Target date: 09/28/2022   Pt to be independent with initial HEP  Goal status: INITIAL    LONG TERM GOALS: Target date: 11/09/2022  Pt to be independent with final HEP  Goal status: INITIAL  2.  Pt to demo improved strength of L hip to be at least 4+/5, to improve pain and stability   Goal status: INITIAL  3.  Pt to demo ability for ambulation and stairs without pain or deficit , to improve ability for community activity.   Goal status: INITIAL  4. Pt to report decreased pain to 0-2/10 in L hip overall.    PLAN:  PT FREQUENCY: 1-2x/week  PT DURATION: 8 weeks  PLANNED INTERVENTIONS: Therapeutic exercises, Therapeutic activity, Neuromuscular re-education, Patient/Family education, Self Care, Joint mobilization, Joint manipulation, Stair training, Orthotic/Fit training, DME instructions, Aquatic Therapy, Dry Needling, Electrical stimulation, Cryotherapy, Moist  heat, Taping, Ultrasound, Ionotophoresis 4mg /ml Dexamethasone, Manual therapy,  Vasopneumatic device, Traction, Spinal manipulation, Spinal mobilization,Balance training, Gait training,   PLAN FOR NEXT SESSION:    12:06 PM, 09/20/22 Tereasa Coop, DPT Physical Therapy with [facility] Lory

## 2022-09-21 ENCOUNTER — Encounter: Payer: Medicare Other | Admitting: Physical Therapy

## 2022-09-27 ENCOUNTER — Encounter: Payer: Medicare Other | Admitting: Physical Therapy

## 2022-10-05 ENCOUNTER — Ambulatory Visit (INDEPENDENT_AMBULATORY_CARE_PROVIDER_SITE_OTHER): Payer: Medicare Other | Admitting: Podiatry

## 2022-10-05 VITALS — BP 162/71 | Temp 97.9°F

## 2022-10-05 DIAGNOSIS — L97522 Non-pressure chronic ulcer of other part of left foot with fat layer exposed: Secondary | ICD-10-CM | POA: Diagnosis not present

## 2022-10-05 DIAGNOSIS — E0843 Diabetes mellitus due to underlying condition with diabetic autonomic (poly)neuropathy: Secondary | ICD-10-CM | POA: Diagnosis not present

## 2022-10-06 DIAGNOSIS — M461 Sacroiliitis, not elsewhere classified: Secondary | ICD-10-CM | POA: Diagnosis not present

## 2022-10-08 NOTE — Progress Notes (Signed)
Chief Complaint  Patient presents with   Toe Pain    States she has been having a little pain and having a little bit of bright blood. She has a spot under her 3rd toe. No fevers or chills.     Subjective:  Patient presents today status post fourth toe amputation left foot.  DOS: 08/18/2022.  Patient doing well.  WBAT surgical shoe.  Patient has completed her oral antibiotics  Past Medical History:  Diagnosis Date   Anxiety    Arthritis    knees   Coronary artery disease    Depression    Fatty liver    History of nuclear stress test 12/16/2011   exercise myoview; normal images with 2-11mm ST-segment depression - subsequent cath revelaed subtotally occluded small 2nd marginal branch & 75% PDA lesion, normal LV function   Hypertension    Hypothyroidism    Neuropathy    in feet   OSA on CPAP    AHI = 44 (per patient) setting 13 per pt   Personal history of kidney stones    Primary localized osteoarthritis of left knee 04/24/2015   Primary localized osteoarthritis of right knee 07/02/2015   Tobacco abuse    Type 2 diabetes mellitus (HCC)    insulin pump    Past Surgical History:  Procedure Laterality Date   3rd right toe amputation  02/2016   AMPUTATION Right 12/22/2014   Procedure: RIGHT 2ND TOE AMPUTATION;  Surgeon: Toni Arthurs, MD;  [facility];  Service: Orthopedics;  Laterality: Right;   AMPUTATION TOE Left 08/18/2022   Procedure: AMPUTATION TOE;  Surgeon: Felecia Shelling, DPM;  [facility]: [facility];  Service: Podiatry;  Laterality: Left;   AUGMENTATION MAMMAPLASTY Bilateral    BREAST ENHANCEMENT SURGERY Bilateral    CARDIAC CATHETERIZATION  01/04/2012   subtotally occluded small 2nd marginal branch & 75% PDA lesion, normal LV function   CATARACT EXTRACTION Bilateral    with lens implants   CHOLECYSTECTOMY  2008   COLONOSCOPY     COLONOSCOPY WITH PROPOFOL N/A 05/12/2016   Procedure: COLONOSCOPY WITH PROPOFOL;  Surgeon: Charna Elizabeth, MD;  [facility];  Service:  Endoscopy;  Laterality: N/A;   I & D KNEE WITH POLY EXCHANGE Left 05/13/2015   Procedure: IRRIGATION AND DEBRIDEMENT KNEE WITH POLY EXCHANGE;  Surgeon: Teryl Lucy, MD;  [facility];  Service: Orthopedics;  Laterality: Left;   LEFT HEART CATHETERIZATION WITH CORONARY ANGIOGRAM N/A 01/04/2012   Procedure: LEFT HEART CATHETERIZATION WITH CORONARY ANGIOGRAM;  Surgeon: Runell Gess, MD;  Location: [facility] CATH LAB;  Service: Cardiovascular;  Laterality: N/A;   PARTIAL KNEE ARTHROPLASTY Left 04/24/2015   Procedure: LEFT UNICOMPARTMENTAL KNEE ARTHROPLASTY ;  Surgeon: Teryl Lucy, MD;  Location: [facility];  Service: Orthopedics;  Laterality: Left;   PARTIAL KNEE ARTHROPLASTY Right 07/02/2015   Procedure: RIGHT UNI KNEE ARTHROPLASTY;  Surgeon: Teryl Lucy, MD;  Location: [facility];  Service: Orthopedics;  Laterality: Right;  ANESTHESIA:  GENERAL, PRE/POST OP FEMORAL NERVE   POSTERIOR CERVICAL FUSION/FORAMINOTOMY  1990   ROBOTIC ASSISTED TOTAL HYSTERECTOMY WITH BILATERAL SALPINGO OOPHERECTOMY Bilateral 02/17/2015   Procedure: ROBOTIC ASSISTED TOTAL HYSTERECTOMY WITH BILATERAL SALPINGO OOPHORECTOMY;  Surgeon: Adolphus Birchwood, MD;  [facility];  Service: Gynecology;  Laterality: Bilateral;    Allergies  Allergen Reactions   Penicillins Hives    Did it involve swelling of the face/tongue/throat, SOB, or low BP? No Did it involve sudden or severe rash/hives, skin peeling, or  any reaction on the inside of your mouth or nose? Yes Did you need to seek medical attention at a hospital or doctor's office? No When did it last happen?  childhood     If all above answers are "NO", may proceed with cephalosporin use.      Objective/Physical Exam Neurovascular status intact.  Amputation site nicely healed.  Small ulcer noted to the distal tip of the third toe which appears stable.  No exposed bone and no purulence  Radiographic exam LT foot 09/07/2022: Interval resection of  the fourth toe of the left foot at the level of the MTP.  Normal osseous mineralization.  No fractures or irregularities noted.  Assessment: 1. s/p amputation left fourth toe. DOS: 08/18/2022   Plan of Care:  -Patient was evaluated.   -Patient is completed her oral antibiotics -Continue dressing the foot at home -Return to clinic 2 weeks  Felecia Shelling, DPM [facility]  Dr. Felecia Shelling, DPM    [address] [place], [place] [address]                Office [phone]  Fax [phone]

## 2022-10-10 ENCOUNTER — Telehealth: Payer: Self-pay | Admitting: Podiatry

## 2022-10-10 NOTE — Telephone Encounter (Signed)
Pt called and was seen last week and said you were to be calling in an antibiotic and she went to the pharmacy and they did not have it. The pharmacy is correct in chart.

## 2022-10-12 DIAGNOSIS — E1129 Type 2 diabetes mellitus with other diabetic kidney complication: Secondary | ICD-10-CM | POA: Diagnosis not present

## 2022-10-12 DIAGNOSIS — H9191 Unspecified hearing loss, right ear: Secondary | ICD-10-CM | POA: Diagnosis not present

## 2022-10-12 DIAGNOSIS — I10 Essential (primary) hypertension: Secondary | ICD-10-CM | POA: Diagnosis not present

## 2022-10-12 DIAGNOSIS — H6691 Otitis media, unspecified, right ear: Secondary | ICD-10-CM | POA: Diagnosis not present

## 2022-10-12 DIAGNOSIS — F17200 Nicotine dependence, unspecified, uncomplicated: Secondary | ICD-10-CM | POA: Diagnosis not present

## 2022-10-12 DIAGNOSIS — H6122 Impacted cerumen, left ear: Secondary | ICD-10-CM | POA: Diagnosis not present

## 2022-10-12 DIAGNOSIS — Z794 Long term (current) use of insulin: Secondary | ICD-10-CM | POA: Diagnosis not present

## 2022-10-12 DIAGNOSIS — M461 Sacroiliitis, not elsewhere classified: Secondary | ICD-10-CM | POA: Diagnosis not present

## 2022-10-13 ENCOUNTER — Other Ambulatory Visit: Payer: Self-pay | Admitting: Podiatry

## 2022-10-13 MED ORDER — SULFAMETHOXAZOLE-TRIMETHOPRIM 800-160 MG PO TABS: 1.0000 | ORAL_TABLET | Freq: Two times a day (BID) | ORAL | 0 refills | Status: DC

## 2022-10-13 NOTE — Progress Notes (Signed)
Patient: [name] Date of Birth: [birth year]  Reason for Visit: Follow up for new CPAP History from: Patient Primary Neurologist: Frances Furbish   ASSESSMENT AND PLAN 67 y.o. year old female   1.  OSA on CPAP  -Commended on superb compliance.  She will continue current settings and nightly utilization for minimum of 4 hours.  Will send an order to her DME for continued supplies and settings.  She will follow-up in 1 year or sooner if needed.  HISTORY OF PRESENT ILLNESS: Today 10/18/22 Saw Dr. Frances Furbish in May 2024 with prior diagnosis of OSA.  She required a new CPAP machine.  Had HST 07/27/2022 showing severe OSA with total AHI of 44.2/hour and O2 nadir of 80%.  There was a mild central apnea component.   Started new CPAP 08/23/2022.  She loves her new machine.  Uses nasal pillow mask.  She is a retired Buyer, retail.  ESS 12.  Compliance data 100%.  Set pressure 13.  AHI 0.4.  Without CPAP sleep quality is very poor, significant daytime fatigue.  Very pleased with her new machine.  No issues or concerns.  ESS continues to be slightly high, she is diabetic on insulin pump.  Doing PT for strength building, balance, seen sooner today to make it to her next appointment.   HISTORY  07/07/22 Dr. Frances Furbish: I saw your patient, [name], upon your kind request in my sleep clinic today for initial consultation of her sleep disorder, in particular, evaluation of her prior diagnosis of obstructive sleep apnea.  The patient is unaccompanied today.  As you know, Ms. Pullan is a 67 year old female with an underlying medical history of diabetes, neuropathy, depression, anxiety, coronary artery disease, fatty liver, osteoarthritis with status post partial knee arthroplasty bilaterally, status post neck surgery, status post toe amputations on the right, smoking, hypertension, hypothyroidism, and severe obesity with a BMI of over 40, who was previously diagnosed with obstructive sleep apnea and placed  on PAP therapy.  Her Epworth sleepiness score is 8 out of 24, fatigue severity score is 50 out of 63.  Of note, she is on several medications including potentially sedating medications.  She has a prescription for Xanax, she is on Cymbalta, she is on gabapentin. I reviewed your office note from 05/31/2022.  Prior sleep study results are not available for my review today.  Her original sleep study was approximately 18 years ago, she is on her second machine since 2016 which she has been using consistently.  She continues to benefit from treatment but needs her new machine.  She has been getting her supplies in the mail.  She does not currently have a sleep specialist.  She goes to bed somewhere between 8 PM and midnight and rise time is usually around 6 AM.  Not have a TV in her bedroom.  She brought a support dog for her appointment today but was agreeable to keeping the dog in the nurses pod for the appointment.  She lives with her husband and 2 dogs.  They have no children.  She has had a CPAP machine since 2016.  I was able to review compliance download for the past 30 days, she has been using her CPAP which is a ResMed air sense 10 AutoSet machine 100% with an average usage of 7 hours and 54 minutes, residual AHI at goal at 0.6/h, pressure at 13 cm without EPR, leak acceptable with the 95th percentile at 9.8 L/min.  She is a retired respiratory  therapist.  She drinks quite a bit of caffeine in the form of coffee, about 6 cups/day, throughout the day, as late as evening.  She does not currently drink any alcohol.  She smokes 1 pack/day and is currently not working on smoking cessation.  She uses nasal pillows with good success with her CPAP machine.  As she recalls, she was diagnosed with severe sleep apnea, events were approximately 44/h.  She denies night to night nocturia and does not have nocturnal or morning headaches.  REVIEW OF SYSTEMS: Out of a complete 14 system review of symptoms, the patient complains  only of the following symptoms, and all other reviewed systems are negative.  See HPI  ALLERGIES: Allergies  Allergen Reactions   Penicillins Hives    Did it involve swelling of the face/tongue/throat, SOB, or low BP? No  Did it involve sudden or severe rash/hives, skin peeling, or any reaction on the inside of your mouth or nose? Yes  Did you need to seek medical attention at a hospital or doctor's office? No  When did it last happen?  childhood      If all above answers are "NO", may proceed with cephalosporin use.  Other Reaction(s): Not available    HOME MEDICATIONS: Outpatient Medications Prior to Visit  Medication Sig Dispense Refill   atorvastatin (LIPITOR) 20 MG tablet Take 20 mg by mouth daily.     buPROPion (WELLBUTRIN XL) 300 MG 24 hr tablet Take 300 mg by mouth daily.     Continuous Blood Gluc Receiver (FREESTYLE LIBRE 14 DAY READER) DEVI Use to monitor blood glucose     Continuous Blood Gluc Sensor (FREESTYLE LIBRE 14 DAY SENSOR) MISC Use to monitor blood glucose continuously-Dx code E11.29     DULoxetine (CYMBALTA) 60 MG capsule Take 60 mg by mouth daily.     gabapentin (NEURONTIN) 300 MG capsule Take 600 mg by mouth 3 (three) times daily.      gentamicin cream (GARAMYCIN) 0.1 % Apply 1 Application topically 2 (two) times daily. 30 g 1   Insulin Human (INSULIN PUMP) SOLN Inject 1 each into the skin continuous. Humalog - basal rate 3.2 Units/hr (Patient taking differently: Inject 1 each into the skin continuous. Novolog - basal rate 3.2 units/hr)     levothyroxine (SYNTHROID, LEVOTHROID) 137 MCG tablet Take 137 mcg by mouth daily before breakfast.     losartan-hydrochlorothiazide (HYZAAR) 100-12.5 MG tablet Take 1 tablet by mouth daily.  3   nutrition supplement, JUVEN, (JUVEN) PACK Take 1 packet by mouth 2 (two) times daily between meals. 30 each 0   OVER THE COUNTER MEDICATION Pt uses CPAP machine daily.     sulfamethoxazole-trimethoprim (BACTRIM DS) 800-160 MG  tablet Take 1 tablet by mouth 2 (two) times daily. 20 tablet 0   No facility-administered medications prior to visit.    PAST MEDICAL HISTORY: Past Medical History:  Diagnosis Date   Anxiety    Arthritis    knees   Coronary artery disease    Depression    Fatty liver    History of nuclear stress test 12/16/2011   exercise myoview; normal images with 2-77mm ST-segment depression - subsequent cath revelaed subtotally occluded small 2nd marginal branch & 75% PDA lesion, normal LV function   Hypertension    Hypothyroidism    Neuropathy    in feet   OSA on CPAP    AHI = 44 (per patient) setting 13 per pt   Personal history of kidney stones  Primary localized osteoarthritis of left knee 04/24/2015   Primary localized osteoarthritis of right knee 07/02/2015   Tobacco abuse    Type 2 diabetes mellitus (HCC)    insulin pump    PAST SURGICAL HISTORY: Past Surgical History:  Procedure Laterality Date   3rd right toe amputation  02/2016   AMPUTATION Right 12/22/2014   Procedure: RIGHT 2ND TOE AMPUTATION;  Surgeon: Toni Arthurs, MD;  [facility];  Service: Orthopedics;  Laterality: Right;   AMPUTATION TOE Left 08/18/2022   Procedure: AMPUTATION TOE;  Surgeon: Felecia Shelling, DPM;  [facility];  Service: Podiatry;  Laterality: Left;   AUGMENTATION MAMMAPLASTY Bilateral    BREAST ENHANCEMENT SURGERY Bilateral    CARDIAC CATHETERIZATION  01/04/2012   subtotally occluded small 2nd marginal branch & 75% PDA lesion, normal LV function   CATARACT EXTRACTION Bilateral    with lens implants   CHOLECYSTECTOMY  2008   COLONOSCOPY     COLONOSCOPY WITH PROPOFOL N/A 05/12/2016   Procedure: COLONOSCOPY WITH PROPOFOL;  Surgeon: Charna Elizabeth, MD;  [facility];  Service: Endoscopy;  Laterality: N/A;   I & D KNEE WITH POLY EXCHANGE Left 05/13/2015   Procedure: IRRIGATION AND DEBRIDEMENT KNEE WITH POLY EXCHANGE;  Surgeon: Teryl Lucy, MD;  [facility];  Service: Orthopedics;   Laterality: Left;   LEFT HEART CATHETERIZATION WITH CORONARY ANGIOGRAM N/A 01/04/2012   Procedure: LEFT HEART CATHETERIZATION WITH CORONARY ANGIOGRAM;  Surgeon: Runell Gess, MD;  [facility];  Service: Cardiovascular;  Laterality: N/A;   PARTIAL KNEE ARTHROPLASTY Left 04/24/2015   Procedure: LEFT UNICOMPARTMENTAL KNEE ARTHROPLASTY ;  Surgeon: Teryl Lucy, MD;  Location: [facility];  Service: Orthopedics;  Laterality: Left;   PARTIAL KNEE ARTHROPLASTY Right 07/02/2015   Procedure: RIGHT UNI KNEE ARTHROPLASTY;  Surgeon: Teryl Lucy, MD;  Location: [facility];  Service: Orthopedics;  Laterality: Right;  ANESTHESIA:  GENERAL, PRE/POST OP FEMORAL NERVE   POSTERIOR CERVICAL FUSION/FORAMINOTOMY  1990   ROBOTIC ASSISTED TOTAL HYSTERECTOMY WITH BILATERAL SALPINGO OOPHERECTOMY Bilateral 02/17/2015   Procedure: ROBOTIC ASSISTED TOTAL HYSTERECTOMY WITH BILATERAL SALPINGO OOPHORECTOMY;  Surgeon: Adolphus Birchwood, MD;  [facility];  Service: Gynecology;  Laterality: Bilateral;    FAMILY HISTORY: Family History  Problem Relation Age of Onset   Stroke Mother    Hypertension Mother    Diabetes Mother    Alzheimer's disease Mother    Diabetes Father    COPD Father        vent-dependent, MODS   Hypertension Sister    Mental illness Sister        borderline personality d/o   Mental illness Sister        schizoeffective d/o   Diabetes Sister    Sleep apnea Neg Hx     SOCIAL HISTORY: Social History   Socioeconomic History   Marital status: Married    Spouse name: Not on file   Number of children: Not on file   Years of education: 14   Highest education level: Not on file  Occupational History   Occupation: respiratory therapist    Employer: OTHER    Comment: [employer]  Tobacco Use   Smoking status: Every Day    Current packs/day: 1.00    Average packs/day: 1 pack/day for 25.0 years (25.0 ttl pk-yrs)    Types: Cigarettes    Smokeless tobacco: Never   Tobacco comments:    quit April 2021  Vaping Use   Vaping status:  Never Used  Substance and Sexual Activity   Alcohol use: No    Alcohol/week: 0.0 standard drinks of alcohol   Drug use: No   Sexual activity: Not on file  Other Topics Concern   Not on file  Social History Narrative   Not on file   Social Determinants of Health   Financial Resource Strain: Not on file  Food Insecurity: No Food Insecurity (08/17/2022)   Hunger Vital Sign    Worried About Running Out of Food in the Last Year: Never true    Ran Out of Food in the Last Year: Never true  Transportation Needs: No Transportation Needs (08/17/2022)   PRAPARE - Administrator, Civil Service (Medical): No    Lack of Transportation (Non-Medical): No  Physical Activity: Not on file  Stress: Not on file  Social Connections: Not on file  Intimate Partner Violence: Not At Risk (08/17/2022)   Humiliation, Afraid, Rape, and Kick questionnaire    Fear of Current or Ex-Partner: No    Emotionally Abused: No    Physically Abused: No    Sexually Abused: No    PHYSICAL EXAM  Vitals:   10/18/22 0942 10/18/22 0957  BP: (!) 175/96 (!) 150/78  Pulse: 98   Resp: 17   SpO2: 96%   Height: 5\' 7"  (1.702 m)    Body mass index is 42.82 kg/m.  Generalized: Well developed, in no acute distress  Neurological examination  Mentation: Alert oriented to time, place, history taking. Follows all commands speech and language fluent Cranial nerve II-XII: Pupils were equal round reactive to light. Extraocular movements were full, visual field were full on confrontational test. Facial sensation and strength were normal. Head turning and shoulder shrug  were normal and symmetric. Motor: The motor testing reveals 5 over 5 strength of all 4 extremities. Good symmetric motor tone is noted throughout.  Sensory: Sensory testing is intact to soft touch on all 4 extremities. No evidence of extinction is noted.   Coordination: Cerebellar testing reveals good finger-nose-finger bilaterally Gait and station: Gait is normal.  DIAGNOSTIC DATA (LABS, IMAGING, TESTING) - I reviewed patient records, labs, notes, testing and imaging myself where available.  Lab Results  Component Value Date   WBC 10.2 08/19/2022   HGB 12.5 08/19/2022   HCT 38.2 08/19/2022   MCV 100.8 (H) 08/19/2022   PLT 231 08/19/2022      Component Value Date/Time   NA 137 08/19/2022 0316   K 4.0 08/19/2022 0316   CL 105 08/19/2022 0316   CO2 24 08/19/2022 0316   GLUCOSE 86 08/19/2022 0316   BUN 24 (H) 08/19/2022 0316   CREATININE 0.88 08/19/2022 0316   CALCIUM 9.7 08/19/2022 0316   PROT 7.7 08/16/2022 1954   ALBUMIN 3.7 08/16/2022 1954   AST 15 08/16/2022 1954   ALT 14 08/16/2022 1954   ALKPHOS 116 08/16/2022 1954   BILITOT 0.8 08/16/2022 1954   GFRNONAA >60 08/19/2022 0316   GFRAA >60 07/26/2019 0314   No results found for: "CHOL", "HDL", "LDLCALC", "LDLDIRECT", "TRIG", "CHOLHDL" Lab Results  Component Value Date   HGBA1C 7.0 (H) 08/16/2022   No results found for: "VITAMINB12" Lab Results  Component Value Date   TSH 3.708 08/17/2022    Margie Ege, AGNP-C, DNP 10/18/2022, 9:57 AM [facility] [address] [place], [place] [address] [phone]

## 2022-10-13 NOTE — Telephone Encounter (Signed)
Called pt and notified the medication was sent in.

## 2022-10-14 DIAGNOSIS — M461 Sacroiliitis, not elsewhere classified: Secondary | ICD-10-CM | POA: Diagnosis not present

## 2022-10-17 ENCOUNTER — Encounter: Payer: Medicare Other | Admitting: Physical Therapy

## 2022-10-17 DIAGNOSIS — H43811 Vitreous degeneration, right eye: Secondary | ICD-10-CM | POA: Diagnosis not present

## 2022-10-17 DIAGNOSIS — E113411 Type 2 diabetes mellitus with severe nonproliferative diabetic retinopathy with macular edema, right eye: Secondary | ICD-10-CM | POA: Diagnosis not present

## 2022-10-17 DIAGNOSIS — H43823 Vitreomacular adhesion, bilateral: Secondary | ICD-10-CM | POA: Diagnosis not present

## 2022-10-17 DIAGNOSIS — E113492 Type 2 diabetes mellitus with severe nonproliferative diabetic retinopathy without macular edema, left eye: Secondary | ICD-10-CM | POA: Diagnosis not present

## 2022-10-18 ENCOUNTER — Ambulatory Visit (INDEPENDENT_AMBULATORY_CARE_PROVIDER_SITE_OTHER): Payer: Medicare Other | Admitting: Neurology

## 2022-10-18 ENCOUNTER — Encounter: Payer: Self-pay | Admitting: Neurology

## 2022-10-18 ENCOUNTER — Telehealth: Payer: Self-pay

## 2022-10-18 VITALS — BP 150/78 | HR 98 | Resp 17 | Ht 67.0 in

## 2022-10-18 DIAGNOSIS — G4733 Obstructive sleep apnea (adult) (pediatric): Secondary | ICD-10-CM

## 2022-10-18 DIAGNOSIS — M461 Sacroiliitis, not elsewhere classified: Secondary | ICD-10-CM | POA: Diagnosis not present

## 2022-10-18 NOTE — Telephone Encounter (Signed)
-----   Message from Glean Salvo sent at 10/18/2022 10:16 AM EDT ----- Order to DME.  Thanks

## 2022-10-18 NOTE — Patient Instructions (Signed)
Keep up the great work with CPAP.  Continue nightly usage for minimum of 4 hours.  See back in 1 year.  Thanks!!

## 2022-10-18 NOTE — Telephone Encounter (Signed)
Community msg sent thru epic to dme regarding orders placed

## 2022-10-19 ENCOUNTER — Ambulatory Visit (INDEPENDENT_AMBULATORY_CARE_PROVIDER_SITE_OTHER): Payer: Medicare Other | Admitting: Podiatry

## 2022-10-19 ENCOUNTER — Telehealth: Payer: Self-pay

## 2022-10-19 ENCOUNTER — Encounter: Payer: Self-pay | Admitting: Podiatry

## 2022-10-19 DIAGNOSIS — R3915 Urgency of urination: Secondary | ICD-10-CM | POA: Diagnosis not present

## 2022-10-19 DIAGNOSIS — M2042 Other hammer toe(s) (acquired), left foot: Secondary | ICD-10-CM | POA: Diagnosis not present

## 2022-10-19 DIAGNOSIS — F17200 Nicotine dependence, unspecified, uncomplicated: Secondary | ICD-10-CM | POA: Diagnosis not present

## 2022-10-19 DIAGNOSIS — E0843 Diabetes mellitus due to underlying condition with diabetic autonomic (poly)neuropathy: Secondary | ICD-10-CM

## 2022-10-19 DIAGNOSIS — E1129 Type 2 diabetes mellitus with other diabetic kidney complication: Secondary | ICD-10-CM | POA: Diagnosis not present

## 2022-10-19 DIAGNOSIS — L97522 Non-pressure chronic ulcer of other part of left foot with fat layer exposed: Secondary | ICD-10-CM | POA: Diagnosis not present

## 2022-10-19 DIAGNOSIS — E785 Hyperlipidemia, unspecified: Secondary | ICD-10-CM | POA: Diagnosis not present

## 2022-10-19 DIAGNOSIS — Z4681 Encounter for fitting and adjustment of insulin pump: Secondary | ICD-10-CM | POA: Diagnosis not present

## 2022-10-19 DIAGNOSIS — I10 Essential (primary) hypertension: Secondary | ICD-10-CM | POA: Diagnosis not present

## 2022-10-19 DIAGNOSIS — R82998 Other abnormal findings in urine: Secondary | ICD-10-CM | POA: Diagnosis not present

## 2022-10-19 DIAGNOSIS — Z794 Long term (current) use of insulin: Secondary | ICD-10-CM | POA: Diagnosis not present

## 2022-10-19 NOTE — Progress Notes (Signed)
Chief Complaint  Patient presents with   Diabetic Ulcer    Patient is here for a two week F/U for diabetic ulcer of toe    Subjective:  Patient presents today status post fourth toe amputation left foot.  DOS: 08/18/2022.  Patient doing well.  Patient states that she is still taking the oral antibiotics which were represcribed.  She is no longer wearing the surgical shoe and back into her diabetic shoes and insoles.  Past Medical History:  Diagnosis Date   Anxiety    Arthritis    knees   Coronary artery disease    Depression    Fatty liver    History of nuclear stress test 12/16/2011   exercise myoview; normal images with 2-38mm ST-segment depression - subsequent cath revelaed subtotally occluded small 2nd marginal branch & 75% PDA lesion, normal LV function   Hypertension    Hypothyroidism    Neuropathy    in feet   OSA on CPAP    AHI = 44 (per patient) setting 13 per pt   Personal history of kidney stones    Primary localized osteoarthritis of left knee 04/24/2015   Primary localized osteoarthritis of right knee 07/02/2015   Tobacco abuse    Type 2 diabetes mellitus (HCC)    insulin pump    Past Surgical History:  Procedure Laterality Date   3rd right toe amputation  02/2016   AMPUTATION Right 12/22/2014   Procedure: RIGHT 2ND TOE AMPUTATION;  Surgeon: Toni Arthurs, MD;  [facility];  Service: Orthopedics;  Laterality: Right;   AMPUTATION TOE Left 08/18/2022   Procedure: AMPUTATION TOE;  Surgeon: Felecia Shelling, DPM;  [facility];  Service: Podiatry;  Laterality: Left;   AUGMENTATION MAMMAPLASTY Bilateral    BREAST ENHANCEMENT SURGERY Bilateral    CARDIAC CATHETERIZATION  01/04/2012   subtotally occluded small 2nd marginal branch & 75% PDA lesion, normal LV function   CATARACT EXTRACTION Bilateral    with lens implants   CHOLECYSTECTOMY  2008   COLONOSCOPY     COLONOSCOPY WITH PROPOFOL N/A 05/12/2016   Procedure: COLONOSCOPY WITH PROPOFOL;  Surgeon: Charna Elizabeth, MD;  [facility];  Service: Endoscopy;  Laterality: N/A;   I & D KNEE WITH POLY EXCHANGE Left 05/13/2015   Procedure: IRRIGATION AND DEBRIDEMENT KNEE WITH POLY EXCHANGE;  Surgeon: Teryl Lucy, MD;  [facility];  Service: Orthopedics;  Laterality: Left;   LEFT HEART CATHETERIZATION WITH CORONARY ANGIOGRAM N/A 01/04/2012   Procedure: LEFT HEART CATHETERIZATION WITH CORONARY ANGIOGRAM;  Surgeon: Runell Gess, MD;  Location: [facility] CATH LAB;  Service: Cardiovascular;  Laterality: N/A;   PARTIAL KNEE ARTHROPLASTY Left 04/24/2015   Procedure: LEFT UNICOMPARTMENTAL KNEE ARTHROPLASTY ;  Surgeon: Teryl Lucy, MD;  Location: [facility];  Service: Orthopedics;  Laterality: Left;   PARTIAL KNEE ARTHROPLASTY Right 07/02/2015   Procedure: RIGHT UNI KNEE ARTHROPLASTY;  Surgeon: Teryl Lucy, MD;  Location: [facility];  Service: Orthopedics;  Laterality: Right;  ANESTHESIA:  GENERAL, PRE/POST OP FEMORAL NERVE   POSTERIOR CERVICAL FUSION/FORAMINOTOMY  1990   ROBOTIC ASSISTED TOTAL HYSTERECTOMY WITH BILATERAL SALPINGO OOPHERECTOMY Bilateral 02/17/2015   Procedure: ROBOTIC ASSISTED TOTAL HYSTERECTOMY WITH BILATERAL SALPINGO OOPHORECTOMY;  Surgeon: Adolphus Birchwood, MD;  [facility]: [facility];  Service: Gynecology;  Laterality: Bilateral;    Allergies  Allergen Reactions   Penicillins Hives    Did it involve swelling of the face/tongue/throat, SOB, or low BP? No  Did it involve sudden or  severe rash/hives, skin peeling, or any reaction on the inside of your mouth or nose? Yes  Did you need to seek medical attention at a hospital or doctor's office? No  When did it last happen?  childhood      If all above answers are "NO", may proceed with cephalosporin use.  Other Reaction(s): Not available    Objective/Physical Exam Neurovascular status intact.  Amputation site nicely healed with exception of a small pinhead size draining sinus coming from the fourth toe  amputation site.  Slight serous drainage.  No malodor.  No purulence..  Persistent ulcer noted to the distal tip of the third toe which appears stable.  No exposed bone and no purulence.  There is significant hammertoe contracture noted to the third digit contributing to the ulcer to the distal tip of the toe  Radiographic exam LT foot 09/07/2022: Interval resection of the fourth toe of the left foot at the level of the MTP.  Normal osseous mineralization.  No fractures or irregularities noted.  Assessment: 1. s/p amputation left fourth toe. DOS: 08/18/2022 2.  Hammertoe deformity to the left third toe with ulcer to the distal tip of the toe  Plan of Care:  -Patient was evaluated.   - Patient continues to take oral antibiotics prescribed.  Continue -Excisional debridement of the ulcer to the distal tip of the toe was performed today using a 3 Telle scalpel.  Excisional debridement of the necrotic nonviable tissue down to healthier bleeding viable tissue was performed with postdebridement measurement same as pre- -We did discuss the possibility of a flexor tenotomy to the left third toe hammertoe to alleviate pressure from the distal tip of the toe.  Prior to this I would like to ensure that there is no residual infection at the fourth toe amputation site. -In the meantime continue diabetic shoes and insoles -Return to clinic in 3 weeks for follow-up x-ray and reevaluation.  If the small draining sinus has resolved and healed we will likely proceed with flexor tenotomy of the left third toe   Felecia Shelling, DPM [facility]  Dr. Felecia Shelling, DPM    [address] [place], [place] [address]                Office [phone]  Fax [phone]

## 2022-10-19 NOTE — Telephone Encounter (Signed)
Order for CPAP supplies sent through community message via epic

## 2022-10-20 DIAGNOSIS — M461 Sacroiliitis, not elsewhere classified: Secondary | ICD-10-CM | POA: Diagnosis not present

## 2022-10-25 DIAGNOSIS — M461 Sacroiliitis, not elsewhere classified: Secondary | ICD-10-CM | POA: Diagnosis not present

## 2022-10-25 DIAGNOSIS — M25552 Pain in left hip: Secondary | ICD-10-CM | POA: Diagnosis not present

## 2022-10-27 DIAGNOSIS — M461 Sacroiliitis, not elsewhere classified: Secondary | ICD-10-CM | POA: Diagnosis not present

## 2022-11-01 DIAGNOSIS — M461 Sacroiliitis, not elsewhere classified: Secondary | ICD-10-CM | POA: Diagnosis not present

## 2022-11-03 DIAGNOSIS — M461 Sacroiliitis, not elsewhere classified: Secondary | ICD-10-CM | POA: Diagnosis not present

## 2022-11-07 ENCOUNTER — Ambulatory Visit: Payer: Medicare Other | Admitting: Podiatry

## 2022-11-07 DIAGNOSIS — M461 Sacroiliitis, not elsewhere classified: Secondary | ICD-10-CM | POA: Diagnosis not present

## 2022-11-14 ENCOUNTER — Ambulatory Visit (INDEPENDENT_AMBULATORY_CARE_PROVIDER_SITE_OTHER): Payer: Medicare Other

## 2022-11-14 ENCOUNTER — Ambulatory Visit (INDEPENDENT_AMBULATORY_CARE_PROVIDER_SITE_OTHER): Payer: Medicare Other | Admitting: Podiatry

## 2022-11-14 DIAGNOSIS — M461 Sacroiliitis, not elsewhere classified: Secondary | ICD-10-CM | POA: Diagnosis not present

## 2022-11-14 DIAGNOSIS — M2042 Other hammer toe(s) (acquired), left foot: Secondary | ICD-10-CM

## 2022-11-14 DIAGNOSIS — L97522 Non-pressure chronic ulcer of other part of left foot with fat layer exposed: Secondary | ICD-10-CM

## 2022-11-14 DIAGNOSIS — E0843 Diabetes mellitus due to underlying condition with diabetic autonomic (poly)neuropathy: Secondary | ICD-10-CM | POA: Diagnosis not present

## 2022-11-20 NOTE — Progress Notes (Signed)
Chief Complaint  Patient presents with   Wound Check    Wound check to left 4th toe. He has using gentamicin ointment to the area.     Subjective:  Patient presents today status post fourth toe amputation left foot.  DOS: 08/18/2022.  Presenting for follow-up evaluation of an ulcer to the distal tip of the left third toe Past Medical History:  Diagnosis Date   Anxiety    Arthritis    knees   Coronary artery disease    Depression    Fatty liver    History of nuclear stress test 12/16/2011   exercise myoview; normal images with 2-47mm ST-segment depression - subsequent cath revelaed subtotally occluded small 2nd marginal branch & 75% PDA lesion, normal LV function   Hypertension    Hypothyroidism    Neuropathy    in feet   OSA on CPAP    AHI = 44 (per patient) setting 13 per pt   Personal history of kidney stones    Primary localized osteoarthritis of left knee 04/24/2015   Primary localized osteoarthritis of right knee 07/02/2015   Tobacco abuse    Type 2 diabetes mellitus (HCC)    insulin pump    Past Surgical History:  Procedure Laterality Date   3rd right toe amputation  02/2016   AMPUTATION Right 12/22/2014   Procedure: RIGHT 2ND TOE AMPUTATION;  Surgeon: Toni Arthurs, MD;  [facility];  Service: Orthopedics;  Laterality: Right;   AMPUTATION TOE Left 08/18/2022   Procedure: AMPUTATION TOE;  Surgeon: Felecia Shelling, DPM;  [facility]: [facility];  Service: Podiatry;  Laterality: Left;   AUGMENTATION MAMMAPLASTY Bilateral    BREAST ENHANCEMENT SURGERY Bilateral    CARDIAC CATHETERIZATION  01/04/2012   subtotally occluded small 2nd marginal branch & 75% PDA lesion, normal LV function   CATARACT EXTRACTION Bilateral    with lens implants   CHOLECYSTECTOMY  2008   COLONOSCOPY     COLONOSCOPY WITH PROPOFOL N/A 05/12/2016   Procedure: COLONOSCOPY WITH PROPOFOL;  Surgeon: Charna Elizabeth, MD;  [facility];  Service: Endoscopy;  Laterality: N/A;   I & D KNEE WITH POLY  EXCHANGE Left 05/13/2015   Procedure: IRRIGATION AND DEBRIDEMENT KNEE WITH POLY EXCHANGE;  Surgeon: Teryl Lucy, MD;  [facility];  Service: Orthopedics;  Laterality: Left;   LEFT HEART CATHETERIZATION WITH CORONARY ANGIOGRAM N/A 01/04/2012   Procedure: LEFT HEART CATHETERIZATION WITH CORONARY ANGIOGRAM;  Surgeon: Runell Gess, MD;  Location: [facility];  Service: Cardiovascular;  Laterality: N/A;   PARTIAL KNEE ARTHROPLASTY Left 04/24/2015   Procedure: LEFT UNICOMPARTMENTAL KNEE ARTHROPLASTY ;  Surgeon: Teryl Lucy, MD;  Location: [facility];  Service: Orthopedics;  Laterality: Left;   PARTIAL KNEE ARTHROPLASTY Right 07/02/2015   Procedure: RIGHT UNI KNEE ARTHROPLASTY;  Surgeon: Teryl Lucy, MD;  Location: [facility];  Service: Orthopedics;  Laterality: Right;  ANESTHESIA:  GENERAL, PRE/POST OP FEMORAL NERVE   POSTERIOR CERVICAL FUSION/FORAMINOTOMY  1990   ROBOTIC ASSISTED TOTAL HYSTERECTOMY WITH BILATERAL SALPINGO OOPHERECTOMY Bilateral 02/17/2015   Procedure: ROBOTIC ASSISTED TOTAL HYSTERECTOMY WITH BILATERAL SALPINGO OOPHORECTOMY;  Surgeon: Adolphus Birchwood, MD;  [facility];  Service: Gynecology;  Laterality: Bilateral;    Allergies  Allergen Reactions   Penicillins Hives    Did it involve swelling of the face/tongue/throat, SOB, or low BP? No  Did it involve sudden or severe rash/hives, skin peeling, or any reaction on the inside of your mouth or nose? Yes  Did you need to seek medical attention at a hospital or doctor's office? No  When did it last happen?  childhood      If all above answers are "NO", may proceed with cephalosporin use.  Other Reaction(s): Not available    Objective/Physical Exam Neurovascular status intact.  Persistent ulcer noted to the distal tip of the third toe which appears stable.  No exposed bone and no purulence.  There is significant hammertoe contracture noted to the third digit contributing to the ulcer to the  distal tip of the toe  Radiographic exam LT foot 09/07/2022: Interval resection of the fourth toe of the left foot at the level of the MTP.  Normal osseous mineralization.  No fractures or irregularities noted.  Assessment: 1. s/p amputation left fourth toe. DOS: 08/18/2022 2.  Hammertoe deformity to the left third toe with ulcer to the distal tip of the toe  Plan of Care:  -Patient was evaluated.   - Patient continues to take oral antibiotics prescribed.  Continue -Medically necessary excisional debridement of the ulcer to the distal tip of the toe was performed today using a 312 scalpel.  Excisional debridement of the necrotic nonviable tissue down to healthier bleeding viable tissue was performed with postdebridement measurement same as pre- -today also we discussed again flexor tenotomy of the toe to alleviate pressure from the distal tip of the toe.  Patient agrees to proceed with this procedure.  We discussed this last time in detail as well.  I do believe this is necessary to alleviate pressure from the distal tip of the toe in order to potentially alleviate the wound that is being caused by the pressure -No anesthesia was utilized since the patient is completely neuropathic.  The toe was prepped in aseptic manner and a #11 scalpel was utilized to release the flexor tendon of the toe.  The toe was in a more rectus alignment with alleviation from the hammertoe deformity. -Betadine soaked wet-to-dry dry gauze dressings were applied.  Leave clean dry and intact x 1 week -WBAT surgical shoe which was dispensed -Return to clinic 1 week  Felecia Shelling, DPM [facility]  Dr. Felecia Shelling, DPM    [address] [place], [place] [address]                Office [phone]  Fax [phone]

## 2022-11-21 ENCOUNTER — Ambulatory Visit (INDEPENDENT_AMBULATORY_CARE_PROVIDER_SITE_OTHER): Payer: Medicare Other | Admitting: Podiatry

## 2022-11-21 DIAGNOSIS — E0843 Diabetes mellitus due to underlying condition with diabetic autonomic (poly)neuropathy: Secondary | ICD-10-CM

## 2022-11-21 DIAGNOSIS — M2042 Other hammer toe(s) (acquired), left foot: Secondary | ICD-10-CM

## 2022-11-21 DIAGNOSIS — L97522 Non-pressure chronic ulcer of other part of left foot with fat layer exposed: Secondary | ICD-10-CM | POA: Diagnosis not present

## 2022-11-21 NOTE — Progress Notes (Signed)
Chief Complaint  Patient presents with   Wound Check    DENIES N/V/F/C/SOB, DENIES PAIN, NO DRAINAGE    Subjective:  Patient presents today status post flexor tenotomy of the left third digit was performed here in the office on 11/14/2022.  Patient doing well.  Dressings are clean dry and intact.  WBAT in the surgical shoe as instructed.  Past Medical History:  Diagnosis Date   Anxiety    Arthritis    knees   Coronary artery disease    Depression    Fatty liver    History of nuclear stress test 12/16/2011   exercise myoview; normal images with 2-67mm ST-segment depression - subsequent cath revelaed subtotally occluded small 2nd marginal branch & 75% PDA lesion, normal LV function   Hypertension    Hypothyroidism    Neuropathy    in feet   OSA on CPAP    AHI = 44 (per patient) setting 13 per pt   Personal history of kidney stones    Primary localized osteoarthritis of left knee 04/24/2015   Primary localized osteoarthritis of right knee 07/02/2015   Tobacco abuse    Type 2 diabetes mellitus (HCC)    insulin pump    Past Surgical History:  Procedure Laterality Date   3rd right toe amputation  02/2016   AMPUTATION Right 12/22/2014   Procedure: RIGHT 2ND TOE AMPUTATION;  Surgeon: Toni Arthurs, MD;  [facility];  Service: Orthopedics;  Laterality: Right;   AMPUTATION TOE Left 08/18/2022   Procedure: AMPUTATION TOE;  Surgeon: Felecia Shelling, DPM;  [facility];  Service: Podiatry;  Laterality: Left;   AUGMENTATION MAMMAPLASTY Bilateral    BREAST ENHANCEMENT SURGERY Bilateral    CARDIAC CATHETERIZATION  01/04/2012   subtotally occluded small 2nd marginal branch & 75% PDA lesion, normal LV function   CATARACT EXTRACTION Bilateral    with lens implants   CHOLECYSTECTOMY  2008   COLONOSCOPY     COLONOSCOPY WITH PROPOFOL N/A 05/12/2016   Procedure: COLONOSCOPY WITH PROPOFOL;  Surgeon: Charna Elizabeth, MD;  [facility];  Service: Endoscopy;  Laterality: N/A;   I & D  KNEE WITH POLY EXCHANGE Left 05/13/2015   Procedure: IRRIGATION AND DEBRIDEMENT KNEE WITH POLY EXCHANGE;  Surgeon: Teryl Lucy, MD;  [facility];  Service: Orthopedics;  Laterality: Left;   LEFT HEART CATHETERIZATION WITH CORONARY ANGIOGRAM N/A 01/04/2012   Procedure: LEFT HEART CATHETERIZATION WITH CORONARY ANGIOGRAM;  Surgeon: Runell Gess, MD;  Location[facility] CATH [facility];  Service: Cardiovascular;  Laterality: N/A;   PARTIAL KNEE ARTHROPLASTY Left 04/24/2015   Procedure: LEFT UNICOMPARTMENTAL KNEE ARTHROPLASTY ;  Surgeon: Teryl Lucy, MD;  Location: [facility];  Service: Orthopedics;  Laterality: Left;   PARTIAL KNEE ARTHROPLASTY Right 07/02/2015   Procedure: RIGHT UNI KNEE ARTHROPLASTY;  Surgeon: Teryl Lucy, MD;  Location: [facility];  Service: Orthopedics;  Laterality: Right;  ANESTHESIA:  GENERAL, PRE/POST OP FEMORAL NERVE   POSTERIOR CERVICAL FUSION/FORAMINOTOMY  1990   ROBOTIC ASSISTED TOTAL HYSTERECTOMY WITH BILATERAL SALPINGO OOPHERECTOMY Bilateral 02/17/2015   Procedure: ROBOTIC ASSISTED TOTAL HYSTERECTOMY WITH BILATERAL SALPINGO OOPHORECTOMY;  Surgeon: Adolphus Birchwood, MD;  [facility];  Service: Gynecology;  Laterality: Bilateral;    Allergies  Allergen Reactions   Penicillins Hives    Did it involve swelling of the face/tongue/throat, SOB, or low BP? No  Did it involve sudden or severe rash/hives, skin peeling, or any reaction on the inside of your mouth or nose? Yes  Did you need to seek medical attention at a hospital or doctor's office? No  When did it last happen?  childhood      If all above answers are "NO", may proceed with cephalosporin use.  Other Reaction(s): Not available    Objective/Physical Exam Neurovascular status intact.  The toe is in a much more rectus alignment with pressure alleviated from the distal tip of the toe ulcer.  The ulcer appears significantly improved  Assessment: 1. s/p flexor tenotomy left third toe.   Performed in office 11/14/2022 2.  Ulcer distal tip of the left third toe 3.  Diabetes mellitus with peripheral polyneuropathy  Plan of Care:  -Patient was evaluated.  -Patient may discontinue the postop shoe.  Recommend good supportive tennis shoes and sneakers -No debridement of the ulcer was performed today to the distal tip of the toe.  Overall it appears to be significantly improving -Return to clinic 3 months routine footcare  Felecia Shelling, DPM [facility]  Dr. Felecia Shelling, DPM    [address] [place], [place] [address]                Office [phone]  Fax [phone]

## 2022-11-23 DIAGNOSIS — M461 Sacroiliitis, not elsewhere classified: Secondary | ICD-10-CM | POA: Diagnosis not present

## 2022-11-29 DIAGNOSIS — M461 Sacroiliitis, not elsewhere classified: Secondary | ICD-10-CM | POA: Diagnosis not present

## 2022-12-01 DIAGNOSIS — M461 Sacroiliitis, not elsewhere classified: Secondary | ICD-10-CM | POA: Diagnosis not present

## 2022-12-07 ENCOUNTER — Ambulatory Visit (INDEPENDENT_AMBULATORY_CARE_PROVIDER_SITE_OTHER): Payer: Medicare Other

## 2022-12-07 ENCOUNTER — Encounter: Payer: Self-pay | Admitting: Podiatry

## 2022-12-07 ENCOUNTER — Ambulatory Visit (INDEPENDENT_AMBULATORY_CARE_PROVIDER_SITE_OTHER): Payer: Medicare Other | Admitting: Podiatry

## 2022-12-07 DIAGNOSIS — L97522 Non-pressure chronic ulcer of other part of left foot with fat layer exposed: Secondary | ICD-10-CM

## 2022-12-07 DIAGNOSIS — M869 Osteomyelitis, unspecified: Secondary | ICD-10-CM | POA: Diagnosis not present

## 2022-12-07 MED ORDER — SULFAMETHOXAZOLE-TRIMETHOPRIM 800-160 MG PO TABS: 1.0000 | ORAL_TABLET | Freq: Two times a day (BID) | ORAL | 0 refills | Status: DC

## 2022-12-07 NOTE — Progress Notes (Signed)
Chief Complaint  Patient presents with   Diabetes    [facility]  ALC: 6.3    Subjective:  Patient presents today status post flexor tenotomy of the left third digit was performed here in the office on 11/14/2022.  Patient continues to have draining ulcer to the distal tip of the left third toe.  She has noticed increased swelling and tenderness to the toes as well.  Presenting for further treatment and evaluation  Past Medical History:  Diagnosis Date   Anxiety    Arthritis    knees   Coronary artery disease    Depression    Fatty liver    History of nuclear stress test 12/16/2011   exercise myoview; normal images with 2-38mm ST-segment depression - subsequent cath revelaed subtotally occluded small 2nd marginal branch & 75% PDA lesion, normal LV function   Hypertension    Hypothyroidism    Neuropathy    in feet   OSA on CPAP    AHI = 44 (per patient) setting 13 per pt   Personal history of kidney stones    Primary localized osteoarthritis of left knee 04/24/2015   Primary localized osteoarthritis of right knee 07/02/2015   Tobacco abuse    Type 2 diabetes mellitus (HCC)    insulin pump    Past Surgical History:  Procedure Laterality Date   3rd right toe amputation  02/2016   AMPUTATION Right 12/22/2014   Procedure: RIGHT 2ND TOE AMPUTATION;  Surgeon: Toni Arthurs, MD;  [facility];  Service: Orthopedics;  Laterality: Right;   AMPUTATION TOE Left 08/18/2022   Procedure: AMPUTATION TOE;  Surgeon: Felecia Shelling, DPM;  [facility];  Service: Podiatry;  Laterality: Left;   AUGMENTATION MAMMAPLASTY Bilateral    BREAST ENHANCEMENT SURGERY Bilateral    CARDIAC CATHETERIZATION  01/04/2012   subtotally occluded small 2nd marginal branch & 75% PDA lesion, normal LV function   CATARACT EXTRACTION Bilateral    with lens implants   CHOLECYSTECTOMY  2008   COLONOSCOPY     COLONOSCOPY WITH PROPOFOL N/A 05/12/2016   Procedure: COLONOSCOPY WITH PROPOFOL;  Surgeon: Charna Elizabeth, MD;   [facility];  Service: Endoscopy;  Laterality: N/A;   I & D KNEE WITH POLY EXCHANGE Left 05/13/2015   Procedure: IRRIGATION AND DEBRIDEMENT KNEE WITH POLY EXCHANGE;  Surgeon: Teryl Lucy, MD;  [facility];  Service: Orthopedics;  Laterality: Left;   LEFT HEART CATHETERIZATION WITH CORONARY ANGIOGRAM N/A 01/04/2012   Procedure: LEFT HEART CATHETERIZATION WITH CORONARY ANGIOGRAM;  Surgeon: Runell Gess, MD;  [facility] CATH LAB;  Service: Cardiovascular;  Laterality: N/A;   PARTIAL KNEE ARTHROPLASTY Left 04/24/2015   Procedure: LEFT UNICOMPARTMENTAL KNEE ARTHROPLASTY ;  Surgeon: Teryl Lucy, MD;  Location: [facility];  Service: Orthopedics;  Laterality: Left;   PARTIAL KNEE ARTHROPLASTY Right 07/02/2015   Procedure: RIGHT UNI KNEE ARTHROPLASTY;  Surgeon: Teryl Lucy, MD;  Location: [facility];  Service: Orthopedics;  Laterality: Right;  ANESTHESIA:  GENERAL, PRE/POST OP FEMORAL NERVE   POSTERIOR CERVICAL FUSION/FORAMINOTOMY  1990   ROBOTIC ASSISTED TOTAL HYSTERECTOMY WITH BILATERAL SALPINGO OOPHERECTOMY Bilateral 02/17/2015   Procedure: ROBOTIC ASSISTED TOTAL HYSTERECTOMY WITH BILATERAL SALPINGO OOPHORECTOMY;  Surgeon: Adolphus Birchwood, MD;  [facility]: [facility];  Service: Gynecology;  Laterality: Bilateral;    Allergies  Allergen Reactions   Penicillins Hives    Did it involve swelling of the face/tongue/throat, SOB, or low BP? No  Did it involve sudden or severe rash/hives,  skin peeling, or any reaction on the inside of your mouth or nose? Yes  Did you need to seek medical attention at a hospital or doctor's office? No  When did it last happen?  childhood      If all above answers are "NO", may proceed with cephalosporin use.  Other Reaction(s): Not available    Objective/Physical Exam Light touch and protective threshold absent Draining ulcer noted to the distal tip of the left third toe with surrounding erythema and edema.  Concern for  underlying osteomyelitis.  No malodor.  Serous drainage.  Radiographic exam LT foot 12/07/2022 Compared to prior x-rays there is osseous erosions to the distal phalanx of the left third toe consistent with findings of osteomyelitis.   Assessment: 1. s/p flexor tenotomy left third toe.  Performed in office 11/14/2022 2.  Ulcer distal tip of the left third toe 3.  Diabetes mellitus with peripheral polyneuropathy 4.  Osteomyelitis with surrounding cellulitis left third toe  Plan of Care:  -Patient was evaluated.  - X-rays were reviewed today and compared to prior x-rays.  Unfortunately there appears to be cortical erosions to the distal phalanx of the left third toe.  Findings are consistent with osteomyelitis to the toe.  I did discuss the findings on x-ray and recommend amputation of the toes.  Patient has had multiple amputations in the past.  Risk benefits advantages and disadvantages were discussed in detail with the patient.  No guarantees were expressed or implied.  Patient consents for surgical correction and amputation of the toe -Cultures taken and sent to pathology for culture and sensitivity -Prescription for Bactrim DS x 10 days -Authorization for surgery initiated today.  Surgery will consist of amputation left third toe -Return to clinic 1 week postop  Felecia Shelling, DPM [facility]  Dr. Felecia Shelling, DPM    [address] [place], [place] [address]                Office [phone]  Fax [phone]

## 2022-12-15 ENCOUNTER — Other Ambulatory Visit: Payer: Self-pay | Admitting: Podiatry

## 2022-12-15 DIAGNOSIS — M869 Osteomyelitis, unspecified: Secondary | ICD-10-CM | POA: Diagnosis not present

## 2022-12-15 DIAGNOSIS — M86672 Other chronic osteomyelitis, left ankle and foot: Secondary | ICD-10-CM | POA: Diagnosis not present

## 2022-12-15 DIAGNOSIS — M86172 Other acute osteomyelitis, left ankle and foot: Secondary | ICD-10-CM | POA: Diagnosis not present

## 2022-12-15 MED ORDER — SULFAMETHOXAZOLE-TRIMETHOPRIM 800-160 MG PO TABS: 1.0000 | ORAL_TABLET | Freq: Two times a day (BID) | ORAL | 0 refills | Status: AC

## 2022-12-19 DIAGNOSIS — E113492 Type 2 diabetes mellitus with severe nonproliferative diabetic retinopathy without macular edema, left eye: Secondary | ICD-10-CM | POA: Diagnosis not present

## 2022-12-19 DIAGNOSIS — H43811 Vitreous degeneration, right eye: Secondary | ICD-10-CM | POA: Diagnosis not present

## 2022-12-19 DIAGNOSIS — E113411 Type 2 diabetes mellitus with severe nonproliferative diabetic retinopathy with macular edema, right eye: Secondary | ICD-10-CM | POA: Diagnosis not present

## 2022-12-19 DIAGNOSIS — H43823 Vitreomacular adhesion, bilateral: Secondary | ICD-10-CM | POA: Diagnosis not present

## 2022-12-21 ENCOUNTER — Ambulatory Visit (INDEPENDENT_AMBULATORY_CARE_PROVIDER_SITE_OTHER): Payer: Medicare Other

## 2022-12-21 ENCOUNTER — Ambulatory Visit (INDEPENDENT_AMBULATORY_CARE_PROVIDER_SITE_OTHER): Payer: Medicare Other | Admitting: Podiatry

## 2022-12-21 DIAGNOSIS — M869 Osteomyelitis, unspecified: Secondary | ICD-10-CM | POA: Diagnosis not present

## 2022-12-21 NOTE — Progress Notes (Signed)
Chief Complaint  Patient presents with   Routine Post Op    HAVING SOME SWELLING AND REDNESS, NO PAIN DENIES N/V/F/C/SOB, WHEN PT IS HEALED SHE WOULD LIKE TO BE FITTED FOR DIABETIC SHOES    Diabetes    DFC    Subjective:  Patient presents today status post amputation of the left third toe.  Patient doing well.  WBAT in the surgical shoe as instructed.  No new complaints  Past Medical History:  Diagnosis Date   Anxiety    Arthritis    knees   Coronary artery disease    Depression    Fatty liver    History of nuclear stress test 12/16/2011   exercise myoview; normal images with 2-6mm ST-segment depression - subsequent cath revelaed subtotally occluded small 2nd marginal branch & 75% PDA lesion, normal LV function   Hypertension    Hypothyroidism    Neuropathy    in feet   OSA on CPAP    AHI = 44 (per patient) setting 13 per pt   Personal history of kidney stones    Primary localized osteoarthritis of left knee 04/24/2015   Primary localized osteoarthritis of right knee 07/02/2015   Tobacco abuse    Type 2 diabetes mellitus (HCC)    insulin pump    Past Surgical History:  Procedure Laterality Date   3rd right toe amputation  02/2016   AMPUTATION Right 12/22/2014   Procedure: RIGHT 2ND TOE AMPUTATION;  Surgeon: Toni Arthurs, MD;  [facility];  Service: Orthopedics;  Laterality: Right;   AMPUTATION TOE Left 08/18/2022   Procedure: AMPUTATION TOE;  Surgeon: Felecia Shelling, DPM;  [facility];  Service: Podiatry;  Laterality: Left;   AUGMENTATION MAMMAPLASTY Bilateral    BREAST ENHANCEMENT SURGERY Bilateral    CARDIAC CATHETERIZATION  01/04/2012   subtotally occluded small 2nd marginal branch & 75% PDA lesion, normal LV function   CATARACT EXTRACTION Bilateral    with lens implants   CHOLECYSTECTOMY  2008   COLONOSCOPY     COLONOSCOPY WITH PROPOFOL N/A 05/12/2016   Procedure: COLONOSCOPY WITH PROPOFOL;  Surgeon: Charna Elizabeth, MD;  [facility];  Service:  Endoscopy;  Laterality: N/A;   I & D KNEE WITH POLY EXCHANGE Left 05/13/2015   Procedure: IRRIGATION AND DEBRIDEMENT KNEE WITH POLY EXCHANGE;  Surgeon: Teryl Lucy, MD;  [facility];  Service: Orthopedics;  Laterality: Left;   LEFT HEART CATHETERIZATION WITH CORONARY ANGIOGRAM N/A 01/04/2012   Procedure: LEFT HEART CATHETERIZATION WITH CORONARY ANGIOGRAM;  Surgeon: Runell Gess, MD;  Location: [facility] CATH LAB;  Service: Cardiovascular;  Laterality: N/A;   PARTIAL KNEE ARTHROPLASTY Left 04/24/2015   Procedure: LEFT UNICOMPARTMENTAL KNEE ARTHROPLASTY ;  Surgeon: Teryl Lucy, MD;  Location: [facility];  Service: Orthopedics;  Laterality: Left;   PARTIAL KNEE ARTHROPLASTY Right 07/02/2015   Procedure: RIGHT UNI KNEE ARTHROPLASTY;  Surgeon: Teryl Lucy, MD;  Location: [facility];  Service: Orthopedics;  Laterality: Right;  ANESTHESIA:  GENERAL, PRE/POST OP FEMORAL NERVE   POSTERIOR CERVICAL FUSION/FORAMINOTOMY  1990   ROBOTIC ASSISTED TOTAL HYSTERECTOMY WITH BILATERAL SALPINGO OOPHERECTOMY Bilateral 02/17/2015   Procedure: ROBOTIC ASSISTED TOTAL HYSTERECTOMY WITH BILATERAL SALPINGO OOPHORECTOMY;  Surgeon: Adolphus Birchwood, MD;  Location: [facility];  Service: Gynecology;  Laterality: Bilateral;    Allergies  Allergen Reactions   Penicillins Hives    Did it involve swelling of the face/tongue/throat, SOB, or low BP? No  Did it involve sudden or severe rash/hives, skin  peeling, or any reaction on the inside of your mouth or nose? Yes  Did you need to seek medical attention at a hospital or doctor's office? No  When did it last happen?  childhood      If all above answers are "NO", may proceed with cephalosporin use.  Other Reaction(s): Not available    Objective/Physical Exam Neurovascular status intact.  Incision well coapted with sutures intact.  Improvement of the edema and erythema around the amputation site.  Assessment: 1. s/p amputation left third toe. DOS:  12/15/2022   Plan of Care:  -Patient was evaluated. -Recommend Betadine wet-to-dry dressings daily -Continue oral antibiotics until completed -Continue WBAT surgical shoe -Return to clinic 1 week  Felecia Shelling, DPM [facility]  Dr. Felecia Shelling, DPM    [address] [place], [place] [address]                Office [phone]  Fax [phone]

## 2022-12-28 ENCOUNTER — Ambulatory Visit (INDEPENDENT_AMBULATORY_CARE_PROVIDER_SITE_OTHER): Payer: Medicare Other

## 2022-12-28 ENCOUNTER — Encounter: Payer: Self-pay | Admitting: Podiatry

## 2022-12-28 ENCOUNTER — Ambulatory Visit (INDEPENDENT_AMBULATORY_CARE_PROVIDER_SITE_OTHER): Payer: Medicare Other | Admitting: Podiatry

## 2022-12-28 DIAGNOSIS — M778 Other enthesopathies, not elsewhere classified: Secondary | ICD-10-CM

## 2022-12-28 DIAGNOSIS — Z89422 Acquired absence of other left toe(s): Secondary | ICD-10-CM

## 2022-12-28 DIAGNOSIS — E0842 Diabetes mellitus due to underlying condition with diabetic polyneuropathy: Secondary | ICD-10-CM

## 2023-01-09 DIAGNOSIS — E113411 Type 2 diabetes mellitus with severe nonproliferative diabetic retinopathy with macular edema, right eye: Secondary | ICD-10-CM | POA: Diagnosis not present

## 2023-01-09 NOTE — Progress Notes (Signed)
Chief Complaint  Patient presents with   Routine Post Op    POV #2 patient states has no feeling     Subjective:  Patient presents today status post amputation of the left third toe.  Patient continues to do well.  WBAT surgical shoe.  No new complaints  Past Medical History:  Diagnosis Date   Anxiety    Arthritis    knees   Coronary artery disease    Depression    Fatty liver    History of nuclear stress test 12/16/2011   exercise myoview; normal images with 2-65mm ST-segment depression - subsequent cath revelaed subtotally occluded small 2nd marginal branch & 75% PDA lesion, normal LV function   Hypertension    Hypothyroidism    Neuropathy    in feet   OSA on CPAP    AHI = 44 (per patient) setting 13 per pt   Personal history of kidney stones    Primary localized osteoarthritis of left knee 04/24/2015   Primary localized osteoarthritis of right knee 07/02/2015   Tobacco abuse    Type 2 diabetes mellitus (HCC)    insulin pump    Past Surgical History:  Procedure Laterality Date   3rd right toe amputation  02/2016   AMPUTATION Right 12/22/2014   Procedure: RIGHT 2ND TOE AMPUTATION;  Surgeon: Toni Arthurs, MD;  [facility]: [facility];  Service: Orthopedics;  Laterality: Right;   AMPUTATION TOE Left 08/18/2022   Procedure: AMPUTATION TOE;  Surgeon: Felecia Shelling, DPM;  Location: [facility];  Service: Podiatry;  Laterality: Left;   AUGMENTATION MAMMAPLASTY Bilateral    BREAST ENHANCEMENT SURGERY Bilateral    CARDIAC CATHETERIZATION  01/04/2012   subtotally occluded small 2nd marginal branch & 75% PDA lesion, normal LV function   CATARACT EXTRACTION Bilateral    with lens implants   CHOLECYSTECTOMY  2008   COLONOSCOPY     COLONOSCOPY WITH PROPOFOL N/A 05/12/2016   Procedure: COLONOSCOPY WITH PROPOFOL;  Surgeon: Charna Elizabeth, MD;  Location: [facility];  Service: Endoscopy;  Laterality: N/A;   I & D KNEE WITH POLY EXCHANGE Left 05/13/2015   Procedure: IRRIGATION AND DEBRIDEMENT KNEE  WITH POLY EXCHANGE;  Surgeon: Teryl Lucy, MD;  Location: [facility];  Service: Orthopedics;  Laterality: Left;   LEFT HEART CATHETERIZATION WITH CORONARY ANGIOGRAM N/A 01/04/2012   Procedure: LEFT HEART CATHETERIZATION WITH CORONARY ANGIOGRAM;  Surgeon: Runell Gess, MD;  Location: [facility];  Service: Cardiovascular;  Laterality: N/A;   PARTIAL KNEE ARTHROPLASTY Left 04/24/2015   Procedure: LEFT UNICOMPARTMENTAL KNEE ARTHROPLASTY ;  Surgeon: Teryl Lucy, MD;  Location: [facility];  Service: Orthopedics;  Laterality: Left;   PARTIAL KNEE ARTHROPLASTY Right 07/02/2015   Procedure: RIGHT UNI KNEE ARTHROPLASTY;  Surgeon: Teryl Lucy, MD;  Location: [facility];  Service: Orthopedics;  Laterality: Right;  ANESTHESIA:  GENERAL, PRE/POST OP FEMORAL NERVE   POSTERIOR CERVICAL FUSION/FORAMINOTOMY  1990   ROBOTIC ASSISTED TOTAL HYSTERECTOMY WITH BILATERAL SALPINGO OOPHERECTOMY Bilateral 02/17/2015   Procedure: ROBOTIC ASSISTED TOTAL HYSTERECTOMY WITH BILATERAL SALPINGO OOPHORECTOMY;  Surgeon: Adolphus Birchwood, MD;  Location: [facility];  Service: Gynecology;  Laterality: Bilateral;    Allergies  Allergen Reactions   Penicillins Hives    Did it involve swelling of the face/tongue/throat, SOB, or low BP? No  Did it involve sudden or severe rash/hives, skin peeling, or any reaction on the inside of your mouth or nose? Yes  Did you need to seek medical attention at a hospital  or doctor's office? No  When did it last happen?  childhood      If all above answers are "NO", may proceed with cephalosporin use.  Other Reaction(s): Not available    Objective/Physical Exam Neurovascular status intact.  Incision well coapted with sutures intact.  No erythema around the amputation site.  No acute edema  Assessment: 1. s/p amputation left third toe. DOS: 12/15/2022   Plan of Care:  -Patient was evaluated. - Sutures removed -Patient may transition out of the postsurgical shoe  back into her diabetic shoes with custom molded Plastizote insoles -Patient is due for new diabetic shoes and insoles.  Appointment with diabetic shoe department to be remolded.  Order placed -Return to clinic 1 month follow-up   Felecia Shelling, DPM [facility]  Dr. Felecia Shelling, DPM    [address] [place], [place] [address]                Office [phone]  Fax [phone]

## 2023-01-11 ENCOUNTER — Ambulatory Visit (INDEPENDENT_AMBULATORY_CARE_PROVIDER_SITE_OTHER): Payer: Medicare Other | Admitting: Podiatry

## 2023-01-11 DIAGNOSIS — Z89422 Acquired absence of other left toe(s): Secondary | ICD-10-CM

## 2023-01-11 NOTE — Progress Notes (Signed)
Chief Complaint  Patient presents with   Routine Post Op    POV # 3 DOS 12/15/22 ---Amputation left third toe    Subjective:  Patient presents today status post amputation of the left third toe.  Patient doing very well.  She says that the foot is doing nicely and she is in regular tennis shoes now  Past Medical History:  Diagnosis Date   Anxiety    Arthritis    knees   Coronary artery disease    Depression    Fatty liver    History of nuclear stress test 12/16/2011   exercise myoview; normal images with 2-22mm ST-segment depression - subsequent cath revelaed subtotally occluded small 2nd marginal branch & 75% PDA lesion, normal LV function   Hypertension    Hypothyroidism    Neuropathy    in feet   OSA on CPAP    AHI = 44 (per patient) setting 13 per pt   Personal history of kidney stones    Primary localized osteoarthritis of left knee 04/24/2015   Primary localized osteoarthritis of right knee 07/02/2015   Tobacco abuse    Type 2 diabetes mellitus (HCC)    insulin pump    Past Surgical History:  Procedure Laterality Date   3rd right toe amputation  02/2016   AMPUTATION Right 12/22/2014   Procedure: RIGHT 2ND TOE AMPUTATION;  Surgeon: Toni Arthurs, MD;  [facility];  Service: Orthopedics;  Laterality: Right;   AMPUTATION TOE Left 08/18/2022   Procedure: AMPUTATION TOE;  Surgeon: Felecia Shelling, DPM;  [facility];  Service: Podiatry;  Laterality: Left;   AUGMENTATION MAMMAPLASTY Bilateral    BREAST ENHANCEMENT SURGERY Bilateral    CARDIAC CATHETERIZATION  01/04/2012   subtotally occluded small 2nd marginal branch & 75% PDA lesion, normal LV function   CATARACT EXTRACTION Bilateral    with lens implants   CHOLECYSTECTOMY  2008   COLONOSCOPY     COLONOSCOPY WITH PROPOFOL N/A 05/12/2016   Procedure: COLONOSCOPY WITH PROPOFOL;  Surgeon: Charna Elizabeth, MD;  [facility];  Service: Endoscopy;  Laterality: N/A;   I & D KNEE WITH POLY EXCHANGE Left 05/13/2015    Procedure: IRRIGATION AND DEBRIDEMENT KNEE WITH POLY EXCHANGE;  Surgeon: Teryl Lucy, MD;  [facility];  Service: Orthopedics;  Laterality: Left;   LEFT HEART CATHETERIZATION WITH CORONARY ANGIOGRAM N/A 01/04/2012   Procedure: LEFT HEART CATHETERIZATION WITH CORONARY ANGIOGRAM;  Surgeon: Runell Gess, MD;  Location: [facility] CATH LAB;  Service: Cardiovascular;  Laterality: N/A;   PARTIAL KNEE ARTHROPLASTY Left 04/24/2015   Procedure: LEFT UNICOMPARTMENTAL KNEE ARTHROPLASTY ;  Surgeon: Teryl Lucy, MD;  Location: [facility];  Service: Orthopedics;  Laterality: Left;   PARTIAL KNEE ARTHROPLASTY Right 07/02/2015   Procedure: RIGHT UNI KNEE ARTHROPLASTY;  Surgeon: Teryl Lucy, MD;  Location: [facility];  Service: Orthopedics;  Laterality: Right;  ANESTHESIA:  GENERAL, PRE/POST OP FEMORAL NERVE   POSTERIOR CERVICAL FUSION/FORAMINOTOMY  1990   ROBOTIC ASSISTED TOTAL HYSTERECTOMY WITH BILATERAL SALPINGO OOPHERECTOMY Bilateral 02/17/2015   Procedure: ROBOTIC ASSISTED TOTAL HYSTERECTOMY WITH BILATERAL SALPINGO OOPHORECTOMY;  Surgeon: Adolphus Birchwood, MD;  [facility];  Service: Gynecology;  Laterality: Bilateral;    Allergies  Allergen Reactions   Penicillins Hives    Did it involve swelling of the face/tongue/throat, SOB, or low BP? No  Did it involve sudden or severe rash/hives, skin peeling, or any reaction on the inside of your mouth or nose? Yes  Did  you need to seek medical attention at a hospital or doctor's office? No  When did it last happen?  childhood      If all above answers are "NO", may proceed with cephalosporin use.  Other Reaction(s): Not available    Objective/Physical Exam Neurovascular status intact.  Incision nicely healed.  No erythema or edema.  Overall well-healed surgical amputation site  Assessment: 1. s/p amputation left third toe. DOS: 12/15/2022   Plan of Care:  -Patient was evaluated. - Overall the amputation site has  healed nicely.  No indication of underlying infection -Patient is being fitted next week for new custom molded diabetic insoles and shoes here in our office -Return to clinic 3 months routine footcare  Felecia Shelling, DPM [facility]  Dr. Felecia Shelling, DPM    [address] [place], [place] [address]                Office [phone]  Fax [phone]

## 2023-01-17 DIAGNOSIS — E785 Hyperlipidemia, unspecified: Secondary | ICD-10-CM | POA: Diagnosis not present

## 2023-01-17 DIAGNOSIS — Z4681 Encounter for fitting and adjustment of insulin pump: Secondary | ICD-10-CM | POA: Diagnosis not present

## 2023-01-17 DIAGNOSIS — Z794 Long term (current) use of insulin: Secondary | ICD-10-CM | POA: Diagnosis not present

## 2023-01-17 DIAGNOSIS — F17200 Nicotine dependence, unspecified, uncomplicated: Secondary | ICD-10-CM | POA: Diagnosis not present

## 2023-01-17 DIAGNOSIS — E1129 Type 2 diabetes mellitus with other diabetic kidney complication: Secondary | ICD-10-CM | POA: Diagnosis not present

## 2023-01-17 DIAGNOSIS — I10 Essential (primary) hypertension: Secondary | ICD-10-CM | POA: Diagnosis not present

## 2023-01-18 ENCOUNTER — Ambulatory Visit: Payer: Medicare Other

## 2023-01-18 DIAGNOSIS — M2042 Other hammer toe(s) (acquired), left foot: Secondary | ICD-10-CM

## 2023-01-18 DIAGNOSIS — E0843 Diabetes mellitus due to underlying condition with diabetic autonomic (poly)neuropathy: Secondary | ICD-10-CM

## 2023-01-18 DIAGNOSIS — Z89422 Acquired absence of other left toe(s): Secondary | ICD-10-CM

## 2023-01-18 DIAGNOSIS — L97522 Non-pressure chronic ulcer of other part of left foot with fat layer exposed: Secondary | ICD-10-CM

## 2023-01-18 NOTE — Progress Notes (Signed)
Patient presents to the office today for diabetic shoe and insole measuring.  Patient was measured with brannock device to determine size and width for 1 pair of extra depth shoes and foam casted for 3 pair of insoles.   Documentation of medical necessity will be sent to patient's treating diabetic doctor to verify and sign.   Patient's diabetic provider: Geoffry Paradise MD   Shoes and insoles will be ordered at that time and patient will be notified for an appointment for fitting when they arrive.   Shoe size (per patient): 8.5 Brannock measurement: 8.5 Patient shoe selection- Shoe choice:   825 Shoe size ordered: 8.5XXW

## 2023-03-06 DIAGNOSIS — E611 Iron deficiency: Secondary | ICD-10-CM | POA: Diagnosis not present

## 2023-03-06 DIAGNOSIS — I10 Essential (primary) hypertension: Secondary | ICD-10-CM | POA: Diagnosis not present

## 2023-03-06 DIAGNOSIS — F17219 Nicotine dependence, cigarettes, with unspecified nicotine-induced disorders: Secondary | ICD-10-CM | POA: Diagnosis not present

## 2023-03-06 DIAGNOSIS — E559 Vitamin D deficiency, unspecified: Secondary | ICD-10-CM | POA: Diagnosis not present

## 2023-03-06 DIAGNOSIS — Z6841 Body Mass Index (BMI) 40.0 and over, adult: Secondary | ICD-10-CM | POA: Diagnosis not present

## 2023-03-06 DIAGNOSIS — N951 Menopausal and female climacteric states: Secondary | ICD-10-CM | POA: Diagnosis not present

## 2023-03-06 DIAGNOSIS — E7849 Other hyperlipidemia: Secondary | ICD-10-CM | POA: Diagnosis not present

## 2023-03-06 DIAGNOSIS — E1140 Type 2 diabetes mellitus with diabetic neuropathy, unspecified: Secondary | ICD-10-CM | POA: Diagnosis not present

## 2023-03-06 DIAGNOSIS — F330 Major depressive disorder, recurrent, mild: Secondary | ICD-10-CM | POA: Diagnosis not present

## 2023-03-06 DIAGNOSIS — E039 Hypothyroidism, unspecified: Secondary | ICD-10-CM | POA: Diagnosis not present

## 2023-03-06 DIAGNOSIS — B372 Candidiasis of skin and nail: Secondary | ICD-10-CM | POA: Diagnosis not present

## 2023-03-08 ENCOUNTER — Ambulatory Visit: Payer: Medicare Other | Admitting: Podiatry

## 2023-03-08 ENCOUNTER — Encounter: Payer: Self-pay | Admitting: Podiatry

## 2023-03-08 DIAGNOSIS — M2042 Other hammer toe(s) (acquired), left foot: Secondary | ICD-10-CM

## 2023-03-08 DIAGNOSIS — M79674 Pain in right toe(s): Secondary | ICD-10-CM | POA: Diagnosis not present

## 2023-03-08 DIAGNOSIS — B351 Tinea unguium: Secondary | ICD-10-CM | POA: Diagnosis not present

## 2023-03-08 DIAGNOSIS — M79675 Pain in left toe(s): Secondary | ICD-10-CM

## 2023-03-08 NOTE — Progress Notes (Signed)
 Chief Complaint  Patient presents with   Diabetes    Patient states she is here for [facility] , everything has been ok since last visit     SUBJECTIVE Patient with a history of diabetes mellitus and multiple prior toe potation's secondary to osteomyelitis presents to office today complaining of elongated, thickened nails that cause pain while ambulating in shoes.  Patient is unable to trim their own nails. Patient is here for further evaluation and treatment.  Past Medical History:  Diagnosis Date   Anxiety    Arthritis    knees   Coronary artery disease    Depression    Fatty liver    History of nuclear stress test 12/16/2011   exercise myoview; normal images with 2-36mm ST-segment depression - subsequent cath revelaed subtotally occluded small 2nd marginal branch & 75% PDA lesion, normal LV function   Hypertension    Hypothyroidism    Neuropathy    in feet   OSA on CPAP    AHI = 44 (per patient) setting 13 per pt   Personal history of kidney stones    Primary localized osteoarthritis of left knee 04/24/2015   Primary localized osteoarthritis of right knee 07/02/2015   Tobacco abuse    Type 2 diabetes mellitus (HCC)    insulin  pump    Allergies  Allergen Reactions   Penicillins Hives    Did it involve swelling of the face/tongue/throat, SOB, or low BP? No  Did it involve sudden or severe rash/hives, skin peeling, or any reaction on the inside of your mouth or nose? Yes  Did you need to seek medical attention at a hospital or doctor's office? No  When did it last happen?  childhood      If all above answers are "NO", may proceed with cephalosporin use.  Other Reaction(s): Not available     OBJECTIVE General Patient is awake, alert, and oriented x 3 and in no acute distress. Derm Skin is dry and supple bilateral. Negative open lesions or macerations. Remaining integument unremarkable. Nails are tender, long, thickened and dystrophic with subungual debris, consistent with  onychomycosis, 1-5 bilateral. No signs of infection noted. Vasc  DP and PT pedal pulses palpable bilaterally. Temperature gradient within normal limits.  Neuro light touch and protective threshold sensation diminished bilaterally.  Musculoskeletal Exam history of multiple prior amputations bilateral lesser digits.  Hammertoe contracture noted to the remaining digits  ASSESSMENT 1. Diabetes Mellitus w/ peripheral neuropathy 2.  Pain due to onychomycosis of toenails bilateral 3.  History of multiple prior toe amputations bilateral 4.  Hammertoe lesser digits bilateral  PLAN OF CARE 1. Patient evaluated today. 2. Instructed to maintain good pedal hygiene and foot care. Stressed importance of controlling blood sugar.  3. Mechanical debridement of nails 1-5 bilaterally performed using a nail nipper of the remaining toes on the feet. Filed with dremel without incident.  4.  Continue diabetic shoes with custom molded diabetic Plastizote insoles  5.  Return to clinic in 3 mos. for routine footcare    Dot Gazella, DPM [facility]  Dr. Dot Gazella, DPM    [address], [place] [address]  Office [phone]  Fax [phone]

## 2023-03-13 DIAGNOSIS — E039 Hypothyroidism, unspecified: Secondary | ICD-10-CM | POA: Diagnosis not present

## 2023-03-13 DIAGNOSIS — Z1331 Encounter for screening for depression: Secondary | ICD-10-CM | POA: Diagnosis not present

## 2023-03-13 DIAGNOSIS — N951 Menopausal and female climacteric states: Secondary | ICD-10-CM | POA: Diagnosis not present

## 2023-03-13 DIAGNOSIS — Z1339 Encounter for screening examination for other mental health and behavioral disorders: Secondary | ICD-10-CM | POA: Diagnosis not present

## 2023-03-13 DIAGNOSIS — Z6841 Body Mass Index (BMI) 40.0 and over, adult: Secondary | ICD-10-CM | POA: Diagnosis not present

## 2023-03-13 DIAGNOSIS — E611 Iron deficiency: Secondary | ICD-10-CM | POA: Diagnosis not present

## 2023-03-13 DIAGNOSIS — E063 Autoimmune thyroiditis: Secondary | ICD-10-CM | POA: Diagnosis not present

## 2023-03-13 DIAGNOSIS — E559 Vitamin D deficiency, unspecified: Secondary | ICD-10-CM | POA: Diagnosis not present

## 2023-03-13 DIAGNOSIS — E1140 Type 2 diabetes mellitus with diabetic neuropathy, unspecified: Secondary | ICD-10-CM | POA: Diagnosis not present

## 2023-03-16 DIAGNOSIS — E113411 Type 2 diabetes mellitus with severe nonproliferative diabetic retinopathy with macular edema, right eye: Secondary | ICD-10-CM | POA: Diagnosis not present

## 2023-03-16 DIAGNOSIS — H43823 Vitreomacular adhesion, bilateral: Secondary | ICD-10-CM | POA: Diagnosis not present

## 2023-03-16 DIAGNOSIS — E113492 Type 2 diabetes mellitus with severe nonproliferative diabetic retinopathy without macular edema, left eye: Secondary | ICD-10-CM | POA: Diagnosis not present

## 2023-03-17 DIAGNOSIS — H8110 Benign paroxysmal vertigo, unspecified ear: Secondary | ICD-10-CM | POA: Diagnosis not present

## 2023-03-17 DIAGNOSIS — T148XXA Other injury of unspecified body region, initial encounter: Secondary | ICD-10-CM | POA: Diagnosis not present

## 2023-03-17 DIAGNOSIS — W19XXXA Unspecified fall, initial encounter: Secondary | ICD-10-CM | POA: Diagnosis not present

## 2023-03-17 DIAGNOSIS — F17200 Nicotine dependence, unspecified, uncomplicated: Secondary | ICD-10-CM | POA: Diagnosis not present

## 2023-03-17 DIAGNOSIS — I10 Essential (primary) hypertension: Secondary | ICD-10-CM | POA: Diagnosis not present

## 2023-03-17 DIAGNOSIS — E1129 Type 2 diabetes mellitus with other diabetic kidney complication: Secondary | ICD-10-CM | POA: Diagnosis not present

## 2023-03-17 DIAGNOSIS — E785 Hyperlipidemia, unspecified: Secondary | ICD-10-CM | POA: Diagnosis not present

## 2023-03-21 DIAGNOSIS — I10 Essential (primary) hypertension: Secondary | ICD-10-CM | POA: Diagnosis not present

## 2023-03-21 DIAGNOSIS — Z6841 Body Mass Index (BMI) 40.0 and over, adult: Secondary | ICD-10-CM | POA: Diagnosis not present

## 2023-03-21 DIAGNOSIS — E1140 Type 2 diabetes mellitus with diabetic neuropathy, unspecified: Secondary | ICD-10-CM | POA: Diagnosis not present

## 2023-03-27 DIAGNOSIS — I10 Essential (primary) hypertension: Secondary | ICD-10-CM | POA: Diagnosis not present

## 2023-03-27 DIAGNOSIS — Z6841 Body Mass Index (BMI) 40.0 and over, adult: Secondary | ICD-10-CM | POA: Diagnosis not present

## 2023-04-03 ENCOUNTER — Ambulatory Visit (INDEPENDENT_AMBULATORY_CARE_PROVIDER_SITE_OTHER): Payer: Medicare Other | Admitting: Podiatry

## 2023-04-03 ENCOUNTER — Encounter: Payer: Self-pay | Admitting: Podiatry

## 2023-04-03 DIAGNOSIS — M2142 Flat foot [pes planus] (acquired), left foot: Secondary | ICD-10-CM

## 2023-04-03 DIAGNOSIS — M2141 Flat foot [pes planus] (acquired), right foot: Secondary | ICD-10-CM | POA: Diagnosis not present

## 2023-04-03 DIAGNOSIS — M79675 Pain in left toe(s): Secondary | ICD-10-CM

## 2023-04-03 DIAGNOSIS — L97522 Non-pressure chronic ulcer of other part of left foot with fat layer exposed: Secondary | ICD-10-CM

## 2023-04-03 DIAGNOSIS — E0843 Diabetes mellitus due to underlying condition with diabetic autonomic (poly)neuropathy: Secondary | ICD-10-CM | POA: Diagnosis not present

## 2023-04-03 DIAGNOSIS — M2042 Other hammer toe(s) (acquired), left foot: Secondary | ICD-10-CM

## 2023-04-03 DIAGNOSIS — M79674 Pain in right toe(s): Secondary | ICD-10-CM | POA: Diagnosis not present

## 2023-04-03 DIAGNOSIS — Z6841 Body Mass Index (BMI) 40.0 and over, adult: Secondary | ICD-10-CM | POA: Diagnosis not present

## 2023-04-03 DIAGNOSIS — B351 Tinea unguium: Secondary | ICD-10-CM

## 2023-04-03 DIAGNOSIS — E0842 Diabetes mellitus due to underlying condition with diabetic polyneuropathy: Secondary | ICD-10-CM

## 2023-04-03 DIAGNOSIS — Z89422 Acquired absence of other left toe(s): Secondary | ICD-10-CM

## 2023-04-03 DIAGNOSIS — E1140 Type 2 diabetes mellitus with diabetic neuropathy, unspecified: Secondary | ICD-10-CM | POA: Diagnosis not present

## 2023-04-03 NOTE — Progress Notes (Signed)

## 2023-04-03 NOTE — Progress Notes (Signed)
 Chief Complaint  Patient presents with   Diabetes    Patient is here for [facility] and to pick up shoes    SUBJECTIVE Patient with a history of diabetes mellitus and multiple prior toe potation's secondary to osteomyelitis presents to office today complaining of elongated, thickened nails that cause pain while ambulating in shoes.  Patient is unable to trim their own nails. Patient is here for further evaluation and treatment.  Past Medical History:  Diagnosis Date   Anxiety    Arthritis    knees   Coronary artery disease    Depression    Fatty liver    History of nuclear stress test 12/16/2011   exercise myoview; normal images with 2-47mm ST-segment depression - subsequent cath revelaed subtotally occluded small 2nd marginal branch & 75% PDA lesion, normal LV function   Hypertension    Hypothyroidism    Neuropathy    in feet   OSA on CPAP    AHI = 44 (per patient) setting 13 per pt   Personal history of kidney stones    Primary localized osteoarthritis of left knee 04/24/2015   Primary localized osteoarthritis of right knee 07/02/2015   Tobacco abuse    Type 2 diabetes mellitus (HCC)    insulin  pump    Allergies  Allergen Reactions   Penicillins Hives    Did it involve swelling of the face/tongue/throat, SOB, or low BP? No  Did it involve sudden or severe rash/hives, skin peeling, or any reaction on the inside of your mouth or nose? Yes  Did you need to seek medical attention at a hospital or doctor's office? No  When did it last happen?  childhood      If all above answers are "NO", may proceed with cephalosporin use.  Other Reaction(s): Not available     OBJECTIVE General Patient is awake, alert, and oriented x 3 and in no acute distress. Derm Skin is dry and supple bilateral. Negative open lesions or macerations. Remaining integument unremarkable. Nails are tender, long, thickened and dystrophic with subungual debris, consistent with onychomycosis, 1-5 bilateral. No  signs of infection noted. Vasc  DP and PT pedal pulses palpable bilaterally. Temperature gradient within normal limits.  Neuro light touch and protective threshold sensation diminished bilaterally.  Musculoskeletal Exam history of multiple prior amputations bilateral lesser digits.  Hammertoe contracture noted to the remaining digits  ASSESSMENT 1. Diabetes Mellitus w/ peripheral neuropathy 2.  Pain due to onychomycosis of toenails bilateral 3.  History of multiple prior toe amputations bilateral 4.  Hammertoe lesser digits bilateral 5.  Preulcerative callus lesions bilateral  PLAN OF CARE -Patient evaluated today.  For now we will continue to pursue conservative management since there are no open wounds and the patient is doing well.  Continue to manage the hammertoe deformities conservatively with good supportive shoes and sneakers.  Also stressed the importance of not going barefoot -Instructed to maintain good pedal hygiene and foot care. Stressed importance of controlling blood sugar.  -Mechanical debridement of nails 1-5 bilaterally performed using a nail nipper of the remaining toes on the feet. Filed with dremel without incident.  -Continue diabetic shoes with custom molded diabetic Plastizote insoles.  Patient has an appointment today to have new diabetic shoes and insoles dispensed -Excisional debridement of the hyperkeratotic callus tissue was also performed today using a 312 scalpel without incident or bleeding. -Return to clinic in 3 mos. for routine footcare    Dot Gazella, DPM [facility]  Dr. Dot Gazella, DPM    [address] [place], [place] [address]                Office [phone]  Fax [phone]

## 2023-04-10 DIAGNOSIS — E7849 Other hyperlipidemia: Secondary | ICD-10-CM | POA: Diagnosis not present

## 2023-04-10 DIAGNOSIS — Z6841 Body Mass Index (BMI) 40.0 and over, adult: Secondary | ICD-10-CM | POA: Diagnosis not present

## 2023-04-13 DIAGNOSIS — E1140 Type 2 diabetes mellitus with diabetic neuropathy, unspecified: Secondary | ICD-10-CM | POA: Diagnosis not present

## 2023-04-13 DIAGNOSIS — E785 Hyperlipidemia, unspecified: Secondary | ICD-10-CM | POA: Diagnosis not present

## 2023-04-13 DIAGNOSIS — Z0189 Encounter for other specified special examinations: Secondary | ICD-10-CM | POA: Diagnosis not present

## 2023-04-13 DIAGNOSIS — E039 Hypothyroidism, unspecified: Secondary | ICD-10-CM | POA: Diagnosis not present

## 2023-04-13 DIAGNOSIS — I10 Essential (primary) hypertension: Secondary | ICD-10-CM | POA: Diagnosis not present

## 2023-04-13 DIAGNOSIS — E1129 Type 2 diabetes mellitus with other diabetic kidney complication: Secondary | ICD-10-CM | POA: Diagnosis not present

## 2023-04-17 DIAGNOSIS — F329 Major depressive disorder, single episode, unspecified: Secondary | ICD-10-CM | POA: Diagnosis not present

## 2023-04-17 DIAGNOSIS — E1129 Type 2 diabetes mellitus with other diabetic kidney complication: Secondary | ICD-10-CM | POA: Diagnosis not present

## 2023-04-17 DIAGNOSIS — Z720 Tobacco use: Secondary | ICD-10-CM | POA: Diagnosis not present

## 2023-04-17 DIAGNOSIS — E785 Hyperlipidemia, unspecified: Secondary | ICD-10-CM | POA: Diagnosis not present

## 2023-04-17 DIAGNOSIS — Z794 Long term (current) use of insulin: Secondary | ICD-10-CM | POA: Diagnosis not present

## 2023-04-17 DIAGNOSIS — Z1339 Encounter for screening examination for other mental health and behavioral disorders: Secondary | ICD-10-CM | POA: Diagnosis not present

## 2023-04-17 DIAGNOSIS — G4733 Obstructive sleep apnea (adult) (pediatric): Secondary | ICD-10-CM | POA: Diagnosis not present

## 2023-04-17 DIAGNOSIS — Z1331 Encounter for screening for depression: Secondary | ICD-10-CM | POA: Diagnosis not present

## 2023-04-17 DIAGNOSIS — Z0000 Encounter for general adult medical examination without abnormal findings: Secondary | ICD-10-CM | POA: Diagnosis not present

## 2023-04-17 DIAGNOSIS — E039 Hypothyroidism, unspecified: Secondary | ICD-10-CM | POA: Diagnosis not present

## 2023-04-17 DIAGNOSIS — I10 Essential (primary) hypertension: Secondary | ICD-10-CM | POA: Diagnosis not present

## 2023-04-17 DIAGNOSIS — I700 Atherosclerosis of aorta: Secondary | ICD-10-CM | POA: Diagnosis not present

## 2023-04-17 DIAGNOSIS — G629 Polyneuropathy, unspecified: Secondary | ICD-10-CM | POA: Diagnosis not present

## 2023-04-18 DIAGNOSIS — E611 Iron deficiency: Secondary | ICD-10-CM | POA: Diagnosis not present

## 2023-04-18 DIAGNOSIS — E559 Vitamin D deficiency, unspecified: Secondary | ICD-10-CM | POA: Diagnosis not present

## 2023-04-18 DIAGNOSIS — Z6841 Body Mass Index (BMI) 40.0 and over, adult: Secondary | ICD-10-CM | POA: Diagnosis not present

## 2023-04-19 ENCOUNTER — Ambulatory Visit (INDEPENDENT_AMBULATORY_CARE_PROVIDER_SITE_OTHER): Payer: Medicare Other | Admitting: Podiatry

## 2023-04-19 DIAGNOSIS — M2042 Other hammer toe(s) (acquired), left foot: Secondary | ICD-10-CM

## 2023-04-19 NOTE — Progress Notes (Signed)
 Chief Complaint  Patient presents with   [facility]    Rm#9 [facility] patient has concerns about diabetic shoes.     SUBJECTIVE Patient with a history of diabetes mellitus and multiple prior toe amputations secondary to osteomyelitis presenting for follow-up for routine footcare.  Patient doing very well.  She denies any wounds to the toes or changes in the condition of her feet since last visit.  Past Medical History:  Diagnosis Date   Anxiety    Arthritis    knees   Coronary artery disease    Depression    Fatty liver    History of nuclear stress test 12/16/2011   exercise myoview; normal images with 2-75mm ST-segment depression - subsequent cath revelaed subtotally occluded small 2nd marginal branch & 75% PDA lesion, normal LV function   Hypertension    Hypothyroidism    Neuropathy    in feet   OSA on CPAP    AHI = 44 (per patient) setting 13 per pt   Personal history of kidney stones    Primary localized osteoarthritis of left knee 04/24/2015   Primary localized osteoarthritis of right knee 07/02/2015   Tobacco abuse    Type 2 diabetes mellitus (HCC)    insulin pump    Allergies  Allergen Reactions   Penicillins Hives    Did it involve swelling of the face/tongue/throat, SOB, or low BP? No  Did it involve sudden or severe rash/hives, skin peeling, or any reaction on the inside of your mouth or nose? Yes  Did you need to seek medical attention at a hospital or doctor's office? No  When did it last happen?  childhood      If all above answers are "NO", may proceed with cephalosporin use.  Other Reaction(s): Not available     OBJECTIVE General Patient is awake, alert, and oriented x 3 and in no acute distress. Derm Skin is dry and supple bilateral. Negative open lesions or macerations. Remaining integument unremarkable. Nails are tender, long, thickened and dystrophic with subungual debris, consistent with onychomycosis, 1-5 bilateral. No signs of infection noted. Vasc  DP  and PT pedal pulses palpable bilaterally. Temperature gradient within normal limits.  Neuro light touch and protective threshold sensation diminished bilaterally.  Musculoskeletal Exam history of multiple prior amputations bilateral lesser digits.  Hammertoe contracture noted to the remaining digits  ASSESSMENT 1. Diabetes Mellitus w/ peripheral neuropathy 2.  Pain due to onychomycosis of toenails bilateral 3.  History of multiple prior toe amputations bilateral 4.  Hammertoe lesser digits bilateral 5.  Preulcerative callus lesions bilateral  PLAN OF CARE -Patient evaluated today.  For now we will continue to pursue conservative management since there are no open wounds and the patient is doing well.  Continue to manage the hammertoe deformities conservatively with good supportive shoes and sneakers.  Also stressed the importance of not going barefoot -Instructed to maintain good pedal hygiene and foot care. Stressed importance of controlling blood sugar.  -Continue diabetic shoes with custom molded diabetic Plastizote insoles.  Patient has an appointment today to have new diabetic shoes and insoles dispensed -Excisional debridement of the hyperkeratotic callus tissue was also performed today using a 312 scalpel without incident or bleeding. -Return to clinic in 3 mos. for routine footcare    Felecia Shelling, DPM [facility]  Dr. Felecia Shelling, DPM    [address]  [place], [place] [address]                Office [phone]  Fax [phone]

## 2023-04-24 DIAGNOSIS — E063 Autoimmune thyroiditis: Secondary | ICD-10-CM | POA: Diagnosis not present

## 2023-04-24 DIAGNOSIS — N951 Menopausal and female climacteric states: Secondary | ICD-10-CM | POA: Diagnosis not present

## 2023-04-26 DIAGNOSIS — Z6841 Body Mass Index (BMI) 40.0 and over, adult: Secondary | ICD-10-CM | POA: Diagnosis not present

## 2023-04-26 DIAGNOSIS — R232 Flushing: Secondary | ICD-10-CM | POA: Diagnosis not present

## 2023-04-26 DIAGNOSIS — G479 Sleep disorder, unspecified: Secondary | ICD-10-CM | POA: Diagnosis not present

## 2023-04-26 DIAGNOSIS — E063 Autoimmune thyroiditis: Secondary | ICD-10-CM | POA: Diagnosis not present

## 2023-04-26 DIAGNOSIS — E039 Hypothyroidism, unspecified: Secondary | ICD-10-CM | POA: Diagnosis not present

## 2023-04-26 DIAGNOSIS — N951 Menopausal and female climacteric states: Secondary | ICD-10-CM | POA: Diagnosis not present

## 2023-05-02 DIAGNOSIS — Z794 Long term (current) use of insulin: Secondary | ICD-10-CM | POA: Diagnosis not present

## 2023-05-02 DIAGNOSIS — I10 Essential (primary) hypertension: Secondary | ICD-10-CM | POA: Diagnosis not present

## 2023-05-02 DIAGNOSIS — F17200 Nicotine dependence, unspecified, uncomplicated: Secondary | ICD-10-CM | POA: Diagnosis not present

## 2023-05-02 DIAGNOSIS — E1129 Type 2 diabetes mellitus with other diabetic kidney complication: Secondary | ICD-10-CM | POA: Diagnosis not present

## 2023-05-02 DIAGNOSIS — Z4681 Encounter for fitting and adjustment of insulin pump: Secondary | ICD-10-CM | POA: Diagnosis not present

## 2023-05-02 DIAGNOSIS — E785 Hyperlipidemia, unspecified: Secondary | ICD-10-CM | POA: Diagnosis not present

## 2023-05-03 DIAGNOSIS — Z6841 Body Mass Index (BMI) 40.0 and over, adult: Secondary | ICD-10-CM | POA: Diagnosis not present

## 2023-05-03 DIAGNOSIS — E7849 Other hyperlipidemia: Secondary | ICD-10-CM | POA: Diagnosis not present

## 2023-05-04 ENCOUNTER — Other Ambulatory Visit: Payer: Self-pay | Admitting: Internal Medicine

## 2023-05-04 DIAGNOSIS — Z1231 Encounter for screening mammogram for malignant neoplasm of breast: Secondary | ICD-10-CM

## 2023-05-10 DIAGNOSIS — Z6841 Body Mass Index (BMI) 40.0 and over, adult: Secondary | ICD-10-CM | POA: Diagnosis not present

## 2023-05-10 DIAGNOSIS — I10 Essential (primary) hypertension: Secondary | ICD-10-CM | POA: Diagnosis not present

## 2023-05-15 DIAGNOSIS — E113411 Type 2 diabetes mellitus with severe nonproliferative diabetic retinopathy with macular edema, right eye: Secondary | ICD-10-CM | POA: Diagnosis not present

## 2023-05-15 DIAGNOSIS — E113492 Type 2 diabetes mellitus with severe nonproliferative diabetic retinopathy without macular edema, left eye: Secondary | ICD-10-CM | POA: Diagnosis not present

## 2023-05-17 DIAGNOSIS — I10 Essential (primary) hypertension: Secondary | ICD-10-CM | POA: Diagnosis not present

## 2023-05-17 DIAGNOSIS — Z6841 Body Mass Index (BMI) 40.0 and over, adult: Secondary | ICD-10-CM | POA: Diagnosis not present

## 2023-05-29 DIAGNOSIS — E113411 Type 2 diabetes mellitus with severe nonproliferative diabetic retinopathy with macular edema, right eye: Secondary | ICD-10-CM | POA: Diagnosis not present

## 2023-05-29 DIAGNOSIS — E113492 Type 2 diabetes mellitus with severe nonproliferative diabetic retinopathy without macular edema, left eye: Secondary | ICD-10-CM | POA: Diagnosis not present

## 2023-06-01 DIAGNOSIS — F331 Major depressive disorder, recurrent, moderate: Secondary | ICD-10-CM | POA: Diagnosis not present

## 2023-06-05 ENCOUNTER — Ambulatory Visit

## 2023-06-08 ENCOUNTER — Ambulatory Visit
Admission: RE | Admit: 2023-06-08 | Discharge: 2023-06-08 | Discharge status: Home / Self Care | Source: Ambulatory Visit | Attending: Internal Medicine | Admitting: Internal Medicine

## 2023-06-08 DIAGNOSIS — Z1231 Encounter for screening mammogram for malignant neoplasm of breast: Secondary | ICD-10-CM | POA: Diagnosis not present

## 2023-06-13 ENCOUNTER — Other Ambulatory Visit: Payer: Self-pay | Admitting: Internal Medicine

## 2023-06-13 DIAGNOSIS — R928 Other abnormal and inconclusive findings on diagnostic imaging of breast: Secondary | ICD-10-CM

## 2023-06-21 DIAGNOSIS — F331 Major depressive disorder, recurrent, moderate: Secondary | ICD-10-CM | POA: Diagnosis not present

## 2023-06-22 ENCOUNTER — Ambulatory Visit
Admission: RE | Admit: 2023-06-22 | Discharge: 2023-06-22 | Disposition: A | Discharge status: Home / Self Care | Source: Ambulatory Visit | Attending: Internal Medicine | Admitting: Internal Medicine

## 2023-06-22 ENCOUNTER — Ambulatory Visit

## 2023-06-22 DIAGNOSIS — R922 Inconclusive mammogram: Secondary | ICD-10-CM | POA: Diagnosis not present

## 2023-06-22 DIAGNOSIS — R928 Other abnormal and inconclusive findings on diagnostic imaging of breast: Secondary | ICD-10-CM

## 2023-06-22 DIAGNOSIS — R92322 Mammographic fibroglandular density, left breast: Secondary | ICD-10-CM | POA: Diagnosis not present

## 2023-06-30 DIAGNOSIS — F331 Major depressive disorder, recurrent, moderate: Secondary | ICD-10-CM | POA: Diagnosis not present

## 2023-07-03 ENCOUNTER — Encounter: Payer: Self-pay | Admitting: Podiatry

## 2023-07-03 ENCOUNTER — Ambulatory Visit (INDEPENDENT_AMBULATORY_CARE_PROVIDER_SITE_OTHER): Payer: Medicare Other | Admitting: Podiatry

## 2023-07-03 VITALS — Ht 67.0 in | Wt 273.0 lb

## 2023-07-03 DIAGNOSIS — M79675 Pain in left toe(s): Secondary | ICD-10-CM | POA: Diagnosis not present

## 2023-07-03 DIAGNOSIS — B351 Tinea unguium: Secondary | ICD-10-CM

## 2023-07-03 DIAGNOSIS — M2042 Other hammer toe(s) (acquired), left foot: Secondary | ICD-10-CM | POA: Diagnosis not present

## 2023-07-03 DIAGNOSIS — M79674 Pain in right toe(s): Secondary | ICD-10-CM | POA: Diagnosis not present

## 2023-07-05 NOTE — Progress Notes (Signed)
 Chief Complaint  Patient presents with   Diabetes    Patient is here for routine  Diabetic foot car. IDDM A1C 6.3.    SUBJECTIVE Patient with a history of diabetes mellitus and multiple prior toe amputations secondary to osteomyelitis presenting for follow-up for routine footcare.  Patient doing very well.  She denies any wounds to the toes or changes in the condition of her feet since last visit.  Past Medical History:  Diagnosis Date   Anxiety    Arthritis    knees   Coronary artery disease    Depression    Fatty liver    History of nuclear stress test 12/16/2011   exercise myoview; normal images with 2-2mm ST-segment depression - subsequent cath revelaed subtotally occluded small 2nd marginal branch & 75% PDA lesion, normal LV function   Hypertension    Hypothyroidism    Neuropathy    in feet   OSA on CPAP    AHI = 44 (per patient) setting 13 per pt   Personal history of kidney stones    Primary localized osteoarthritis of left knee 04/24/2015   Primary localized osteoarthritis of right knee 07/02/2015   Tobacco abuse    Type 2 diabetes mellitus (HCC)    insulin  pump    Allergies  Allergen Reactions   Penicillins Hives    Did it involve swelling of the face/tongue/throat, SOB, or low BP? No  Did it involve sudden or severe rash/hives, skin peeling, or any reaction on the inside of your mouth or nose? Yes  Did you need to seek medical attention at a hospital or doctor's office? No  When did it last happen?  childhood      If all above answers are "NO", may proceed with cephalosporin use.  Other Reaction(s): Not available     OBJECTIVE General Patient is awake, alert, and oriented x 3 and in no acute distress. Derm Skin is dry and supple bilateral. Negative open lesions or macerations. Remaining integument unremarkable. Nails are tender, long, thickened and dystrophic with subungual debris, consistent with onychomycosis, 1-5 bilateral. No signs of infection  noted. Vasc  DP and PT pedal pulses palpable bilaterally. Temperature gradient within normal limits.  Neuro light touch and protective threshold sensation diminished bilaterally.  Musculoskeletal Exam history of multiple prior amputations bilateral lesser digits.  Hammertoe contracture noted to the remaining digits  ASSESSMENT 1. Diabetes Mellitus w/ peripheral neuropathy 2.  Pain due to onychomycosis of toenails bilateral 3.  History of multiple prior toe amputations bilateral 4.  Hammertoe lesser digits bilateral 5.  Preulcerative callus lesions bilateral  PLAN OF CARE -Patient evaluated today.  For now we will continue to pursue conservative management since there are no open wounds and the patient is doing well.  Continue to manage the hammertoe deformities conservatively with good supportive shoes and sneakers.  Also stressed the importance of not going barefoot -Instructed to maintain good pedal hygiene and foot care. Stressed importance of controlling blood sugar.  -Continue diabetic shoes with custom molded diabetic Plastizote insoles.  Patient has an appointment today to have new diabetic shoes and insoles dispensed -Excisional debridement of the hyperkeratotic callus tissue was also performed today using a 312 scalpel without incident or bleeding. -Mechanical debridement of nails was performed bilateral without incident or bleeding -Return to clinic in 3 mos. for routine footcare    Dot Gazella, DPM [facility]  Dr. Dot Gazella, DPM    [address] [place] [address]                Office [phone]  Fax [phone]

## 2023-07-06 DIAGNOSIS — E559 Vitamin D deficiency, unspecified: Secondary | ICD-10-CM | POA: Diagnosis not present

## 2023-07-06 DIAGNOSIS — E039 Hypothyroidism, unspecified: Secondary | ICD-10-CM | POA: Diagnosis not present

## 2023-07-06 DIAGNOSIS — Z6841 Body Mass Index (BMI) 40.0 and over, adult: Secondary | ICD-10-CM | POA: Diagnosis not present

## 2023-07-11 DIAGNOSIS — F331 Major depressive disorder, recurrent, moderate: Secondary | ICD-10-CM | POA: Diagnosis not present

## 2023-07-14 DIAGNOSIS — F331 Major depressive disorder, recurrent, moderate: Secondary | ICD-10-CM | POA: Diagnosis not present

## 2023-07-19 DIAGNOSIS — E039 Hypothyroidism, unspecified: Secondary | ICD-10-CM | POA: Diagnosis not present

## 2023-07-19 DIAGNOSIS — E1140 Type 2 diabetes mellitus with diabetic neuropathy, unspecified: Secondary | ICD-10-CM | POA: Diagnosis not present

## 2023-07-19 DIAGNOSIS — Z6841 Body Mass Index (BMI) 40.0 and over, adult: Secondary | ICD-10-CM | POA: Diagnosis not present

## 2023-07-21 DIAGNOSIS — F331 Major depressive disorder, recurrent, moderate: Secondary | ICD-10-CM | POA: Diagnosis not present

## 2023-07-21 DIAGNOSIS — Z01419 Encounter for gynecological examination (general) (routine) without abnormal findings: Secondary | ICD-10-CM | POA: Diagnosis not present

## 2023-07-21 DIAGNOSIS — Z6841 Body Mass Index (BMI) 40.0 and over, adult: Secondary | ICD-10-CM | POA: Diagnosis not present

## 2023-07-21 DIAGNOSIS — N952 Postmenopausal atrophic vaginitis: Secondary | ICD-10-CM | POA: Diagnosis not present

## 2023-07-24 DIAGNOSIS — E113493 Type 2 diabetes mellitus with severe nonproliferative diabetic retinopathy without macular edema, bilateral: Secondary | ICD-10-CM | POA: Diagnosis not present

## 2023-07-24 DIAGNOSIS — H43811 Vitreous degeneration, right eye: Secondary | ICD-10-CM | POA: Diagnosis not present

## 2023-07-24 DIAGNOSIS — H43823 Vitreomacular adhesion, bilateral: Secondary | ICD-10-CM | POA: Diagnosis not present

## 2023-07-28 DIAGNOSIS — F331 Major depressive disorder, recurrent, moderate: Secondary | ICD-10-CM | POA: Diagnosis not present

## 2023-08-04 DIAGNOSIS — F331 Major depressive disorder, recurrent, moderate: Secondary | ICD-10-CM | POA: Diagnosis not present

## 2023-08-09 DIAGNOSIS — E039 Hypothyroidism, unspecified: Secondary | ICD-10-CM | POA: Diagnosis not present

## 2023-08-09 DIAGNOSIS — E08621 Diabetes mellitus due to underlying condition with foot ulcer: Secondary | ICD-10-CM | POA: Diagnosis not present

## 2023-08-09 DIAGNOSIS — Z4681 Encounter for fitting and adjustment of insulin pump: Secondary | ICD-10-CM | POA: Diagnosis not present

## 2023-08-09 DIAGNOSIS — Z6841 Body Mass Index (BMI) 40.0 and over, adult: Secondary | ICD-10-CM | POA: Diagnosis not present

## 2023-08-09 DIAGNOSIS — Z720 Tobacco use: Secondary | ICD-10-CM | POA: Diagnosis not present

## 2023-08-09 DIAGNOSIS — Z794 Long term (current) use of insulin: Secondary | ICD-10-CM | POA: Diagnosis not present

## 2023-08-09 DIAGNOSIS — E785 Hyperlipidemia, unspecified: Secondary | ICD-10-CM | POA: Diagnosis not present

## 2023-08-09 DIAGNOSIS — E559 Vitamin D deficiency, unspecified: Secondary | ICD-10-CM | POA: Diagnosis not present

## 2023-08-09 DIAGNOSIS — I10 Essential (primary) hypertension: Secondary | ICD-10-CM | POA: Diagnosis not present

## 2023-08-11 DIAGNOSIS — F331 Major depressive disorder, recurrent, moderate: Secondary | ICD-10-CM | POA: Diagnosis not present

## 2023-08-18 DIAGNOSIS — F331 Major depressive disorder, recurrent, moderate: Secondary | ICD-10-CM | POA: Diagnosis not present

## 2023-08-23 DIAGNOSIS — E7849 Other hyperlipidemia: Secondary | ICD-10-CM | POA: Diagnosis not present

## 2023-08-23 DIAGNOSIS — Z6841 Body Mass Index (BMI) 40.0 and over, adult: Secondary | ICD-10-CM | POA: Diagnosis not present

## 2023-08-30 DIAGNOSIS — Z6841 Body Mass Index (BMI) 40.0 and over, adult: Secondary | ICD-10-CM | POA: Diagnosis not present

## 2023-08-30 DIAGNOSIS — E039 Hypothyroidism, unspecified: Secondary | ICD-10-CM | POA: Diagnosis not present

## 2023-09-01 DIAGNOSIS — F331 Major depressive disorder, recurrent, moderate: Secondary | ICD-10-CM | POA: Diagnosis not present

## 2023-09-13 DIAGNOSIS — Z6841 Body Mass Index (BMI) 40.0 and over, adult: Secondary | ICD-10-CM | POA: Diagnosis not present

## 2023-09-13 DIAGNOSIS — E7800 Pure hypercholesterolemia, unspecified: Secondary | ICD-10-CM | POA: Diagnosis not present

## 2023-09-22 DIAGNOSIS — F331 Major depressive disorder, recurrent, moderate: Secondary | ICD-10-CM | POA: Diagnosis not present

## 2023-09-26 DIAGNOSIS — F331 Major depressive disorder, recurrent, moderate: Secondary | ICD-10-CM | POA: Diagnosis not present

## 2023-10-02 ENCOUNTER — Encounter: Payer: Self-pay | Admitting: Podiatry

## 2023-10-02 ENCOUNTER — Ambulatory Visit (INDEPENDENT_AMBULATORY_CARE_PROVIDER_SITE_OTHER): Admitting: Podiatry

## 2023-10-02 DIAGNOSIS — B351 Tinea unguium: Secondary | ICD-10-CM | POA: Diagnosis not present

## 2023-10-02 DIAGNOSIS — M79674 Pain in right toe(s): Secondary | ICD-10-CM | POA: Diagnosis not present

## 2023-10-02 DIAGNOSIS — M79675 Pain in left toe(s): Secondary | ICD-10-CM | POA: Diagnosis not present

## 2023-10-09 DIAGNOSIS — G589 Mononeuropathy, unspecified: Secondary | ICD-10-CM | POA: Diagnosis not present

## 2023-10-09 DIAGNOSIS — H43823 Vitreomacular adhesion, bilateral: Secondary | ICD-10-CM | POA: Diagnosis not present

## 2023-10-09 DIAGNOSIS — E1129 Type 2 diabetes mellitus with other diabetic kidney complication: Secondary | ICD-10-CM | POA: Diagnosis not present

## 2023-10-09 DIAGNOSIS — I10 Essential (primary) hypertension: Secondary | ICD-10-CM | POA: Diagnosis not present

## 2023-10-09 DIAGNOSIS — E113493 Type 2 diabetes mellitus with severe nonproliferative diabetic retinopathy without macular edema, bilateral: Secondary | ICD-10-CM | POA: Diagnosis not present

## 2023-10-09 DIAGNOSIS — H43811 Vitreous degeneration, right eye: Secondary | ICD-10-CM | POA: Diagnosis not present

## 2023-10-11 DIAGNOSIS — Z6841 Body Mass Index (BMI) 40.0 and over, adult: Secondary | ICD-10-CM | POA: Diagnosis not present

## 2023-10-11 DIAGNOSIS — E1140 Type 2 diabetes mellitus with diabetic neuropathy, unspecified: Secondary | ICD-10-CM | POA: Diagnosis not present

## 2023-10-17 NOTE — Progress Notes (Signed)
 Chief Complaint  Patient presents with   Diabetes    I think he's going to cut my toenails and trim my calluses.  Saw Dr. Charlie Love - 3 mos ago; A1c - 6.3    SUBJECTIVE Patient with a history of diabetes mellitus and multiple prior toe amputations secondary to osteomyelitis presenting for follow-up for routine footcare.  Patient doing very well.  She denies any wounds to the toes or changes in the condition of her feet since last visit.  Past Medical History:  Diagnosis Date   Anxiety    Arthritis    knees   Coronary artery disease    Depression    Fatty liver    History of nuclear stress test 12/16/2011   exercise myoview; normal images with 2-76mm ST-segment depression - subsequent cath revelaed subtotally occluded small 2nd marginal branch & 75% PDA lesion, normal LV function   Hypertension    Hypothyroidism    Neuropathy    in feet   OSA on CPAP    AHI = 44 (per patient) setting 13 per pt   Personal history of kidney stones    Primary localized osteoarthritis of left knee 04/24/2015   Primary localized osteoarthritis of right knee 07/02/2015   Tobacco abuse    Type 2 diabetes mellitus (HCC)    insulin  pump    Allergies  Allergen Reactions   Penicillins Hives    Did it involve swelling of the face/tongue/throat, SOB, or low BP? No  Did it involve sudden or severe rash/hives, skin peeling, or any reaction on the inside of your mouth or nose? Yes  Did you need to seek medical attention at a hospital or doctor's office? No  When did it last happen?  childhood      If all above answers are NO, may proceed with cephalosporin use.  Other Reaction(s): Not available     OBJECTIVE General Patient is awake, alert, and oriented x 3 and in no acute distress. Derm Skin is dry and supple bilateral. Negative open lesions or macerations. Remaining integument unremarkable. Nails are tender, long, thickened and dystrophic with subungual debris, consistent with  onychomycosis, 1-5 bilateral. No signs of infection noted. Vasc  DP and PT pedal pulses palpable bilaterally. Temperature gradient within normal limits.  Neuro light touch and protective threshold sensation diminished bilaterally.  Musculoskeletal Exam history of multiple prior amputations bilateral lesser digits.  Hammertoe contracture noted to the remaining digits  ASSESSMENT 1. Diabetes Mellitus w/ peripheral neuropathy 2.  Pain due to onychomycosis of toenails bilateral 3.  History of multiple prior toe amputations bilateral 4.  Hammertoe lesser digits bilateral 5.  Preulcerative callus lesions bilateral  PLAN OF CARE -Patient evaluated today.  For now we will continue to pursue conservative management since there are no open wounds and the patient is doing well.  Continue to manage the hammertoe deformities conservatively with good supportive shoes and sneakers.  Also stressed the importance of not going barefoot -Instructed to maintain good pedal hygiene and foot care. Stressed importance of controlling blood sugar.  -Continue diabetic shoes with custom molded diabetic Plastizote insoles.  Patient has an appointment today to have new diabetic shoes and insoles dispensed -Excisional debridement of the hyperkeratotic callus tissue was also performed today using a 312 scalpel without incident or bleeding. -Mechanical debridement of nails was performed bilateral without incident or bleeding -Return to clinic in 3 mos. for routine footcare    Thresa EMERSON Sar, DPM [facility]  Dr. Thresa EMERSON Sar, DPM    [address] [place], [place] [address]                Office [phone]  Fax [phone]

## 2023-10-18 ENCOUNTER — Ambulatory Visit (INDEPENDENT_AMBULATORY_CARE_PROVIDER_SITE_OTHER): Payer: Medicare Other | Admitting: Neurology

## 2023-10-18 VITALS — BP 154/78 | HR 66 | Ht 67.0 in | Wt 269.0 lb

## 2023-10-18 DIAGNOSIS — G4733 Obstructive sleep apnea (adult) (pediatric): Secondary | ICD-10-CM

## 2023-10-18 NOTE — Progress Notes (Signed)
 Patient: [name] Date of Birth: August 06, 1955  Reason for Visit: Follow up for CPAP History from: Patient Primary Neurologist: Buck   ASSESSMENT AND PLAN 68 y.o. year old female   1.  OSA on CPAP (setup July 2024; Had HST 07/27/2022 showing severe OSA with total AHI of 44.2/hour and O2 nadir of 80%.  There was a mild central apnea component)  -Doing great on CPAP.  Has excellent compliance.  Continue current settings and nightly usage for minimum of 4 hours.  Continue replacement supplies routinely through DME.  Follow-up in 1 year virtually or sooner if needed  HISTORY OF PRESENT ILLNESS: Today 10/18/23 Here for CPAP revisit.  CPAP report shows 73% compliance greater than 4 hours.  Set pressure 13 cmH2O.  AHI 0.3.  Leak 11.5. she loves her CPAP. She missed a few days in early August, she stayed with her sister, used her old machine. Using nasal pillow mask. No issues, tolerating well. Brought her dog with her today. ESS 4.   10/18/22 SS: Saw Dr. Buck in May 2024 with prior diagnosis of OSA.  She required a new CPAP machine.  Had HST 07/27/2022 showing severe OSA with total AHI of 44.2/hour and O2 nadir of 80%.  There was a mild central apnea component.   Started new CPAP 08/23/2022.  She loves her new machine.  Uses nasal pillow mask.  She is a retired Buyer, retail.  ESS 12.  Compliance data 100%.  Set pressure 13.  AHI 0.4.  Without CPAP sleep quality is very poor, significant daytime fatigue.  Very pleased with her new machine.  No issues or concerns.  ESS continues to be slightly high, she is diabetic on insulin  pump.  Doing PT for strength building, balance, seen sooner today to make it to her next appointment.   HISTORY  07/07/22 Dr. Buck: I saw your patient, Zoe Creasman, upon your kind request in my sleep clinic today for initial consultation of her sleep disorder, in particular, evaluation of her prior diagnosis of obstructive sleep apnea.  The patient is  unaccompanied today.  As you know, Ms. Shafran is a 68 year old female with an underlying medical history of diabetes, neuropathy, depression, anxiety, coronary artery disease, fatty liver, osteoarthritis with status post partial knee arthroplasty bilaterally, status post neck surgery, status post toe amputations on the right, smoking, hypertension, hypothyroidism, and severe obesity with a BMI of over 40, who was previously diagnosed with obstructive sleep apnea and placed on PAP therapy.  Her Epworth sleepiness score is 8 out of 24, fatigue severity score is 50 out of 63.  Of note, she is on several medications including potentially sedating medications.  She has a prescription for Xanax , she is on Cymbalta , she is on gabapentin . I reviewed your office note from 05/31/2022.  Prior sleep study results are not available for my review today.  Her original sleep study was approximately 18 years ago, she is on her second machine since 2016 which she has been using consistently.  She continues to benefit from treatment but needs her new machine.  She has been getting her supplies in the mail.  She does not currently have a sleep specialist.  She goes to bed somewhere between 8 PM and midnight and rise time is usually around 6 AM.  Not have a TV in her bedroom.  She brought a support dog for her appointment today but was agreeable to keeping the dog in the nurses pod for the appointment.  She  lives with her husband and 2 dogs.  They have no children.  She has had a CPAP machine since 2016.  I was able to review compliance download for the past 30 days, she has been using her CPAP which is a ResMed air sense 10 AutoSet machine 100% with an average usage of 7 hours and 54 minutes, residual AHI at goal at 0.6/h, pressure at 13 cm without EPR, leak acceptable with the 95th percentile at 9.8 L/min.  She is a retired Buyer, retail.  She drinks quite a bit of caffeine in the form of coffee, about 6 cups/day, throughout  the day, as late as evening.  She does not currently drink any alcohol .  She smokes 1 pack/day and is currently not working on smoking cessation.  She uses nasal pillows with good success with her CPAP machine.  As she recalls, she was diagnosed with severe sleep apnea, events were approximately 44/h.  She denies night to night nocturia and does not have nocturnal or morning headaches.  REVIEW OF SYSTEMS: Out of a complete 14 system review of symptoms, the patient complains only of the following symptoms, and all other reviewed systems are negative.  See HPI  ALLERGIES: Allergies  Allergen Reactions   Penicillins Hives    Did it involve swelling of the face/tongue/throat, SOB, or low BP? No  Did it involve sudden or severe rash/hives, skin peeling, or any reaction on the inside of your mouth or nose? Yes  Did you need to seek medical attention at a hospital or doctor's office? No  When did it last happen?  childhood      If all above answers are NO, may proceed with cephalosporin use.  Other Reaction(s): Not available    HOME MEDICATIONS: Outpatient Medications Prior to Visit  Medication Sig Dispense Refill   atorvastatin  (LIPITOR) 20 MG tablet Take 20 mg by mouth daily.     buPROPion  (WELLBUTRIN  XL) 300 MG 24 hr tablet Take 300 mg by mouth daily.     busPIRone (BUSPAR) 5 MG tablet Take 5 mg by mouth 2 (two) times daily.     Continuous Blood Gluc Receiver (FREESTYLE LIBRE 14 DAY READER) DEVI Use to monitor blood glucose     Continuous Blood Gluc Sensor (FREESTYLE LIBRE 14 DAY SENSOR) MISC Use to monitor blood glucose continuously-Dx code E11.29     DULoxetine  (CYMBALTA ) 60 MG capsule Take 60 mg by mouth daily.     gabapentin  (NEURONTIN ) 300 MG capsule Take 600 mg by mouth 3 (three) times daily.      Insulin  Human (INSULIN  PUMP) SOLN Inject 1 each into the skin continuous. Humalog - basal rate 3.2 Units/hr     levothyroxine  (SYNTHROID , LEVOTHROID) 137 MCG tablet Take 137 mcg by  mouth daily before breakfast.     losartan -hydrochlorothiazide  (HYZAAR) 100-12.5 MG tablet Take 1 tablet by mouth daily.  3   OVER THE COUNTER MEDICATION Pt uses CPAP machine daily.     gentamicin  cream (GARAMYCIN ) 0.1 % Apply 1 Application topically 2 (two) times daily. (Patient not taking: Reported on 10/18/2023) 30 g 1   nutrition supplement, JUVEN, (JUVEN) PACK Take 1 packet by mouth 2 (two) times daily between meals. (Patient not taking: Reported on 10/18/2023) 30 each 0   sulfamethoxazole -trimethoprim  (BACTRIM  DS) 800-160 MG tablet Take 1 tablet by mouth 2 (two) times daily. (Patient not taking: Reported on 10/18/2023) 20 tablet 0   No facility-administered medications prior to visit.    PAST MEDICAL HISTORY: Past Medical  History:  Diagnosis Date   Anxiety    Arthritis    knees   Coronary artery disease    Depression    Fatty liver    History of nuclear stress test 12/16/2011   exercise myoview; normal images with 2-1mm ST-segment depression - subsequent cath revelaed subtotally occluded small 2nd marginal branch & 75% PDA lesion, normal LV function   Hypertension    Hypothyroidism    Neuropathy    in feet   OSA on CPAP    AHI = 44 (per patient) setting 13 per pt   Personal history of kidney stones    Primary localized osteoarthritis of left knee 04/24/2015   Primary localized osteoarthritis of right knee 07/02/2015   Tobacco abuse    Type 2 diabetes mellitus (HCC)    insulin  pump    PAST SURGICAL HISTORY: Past Surgical History:  Procedure Laterality Date   3rd right toe amputation  02/2016   AMPUTATION Right 12/22/2014   Procedure: RIGHT 2ND TOE AMPUTATION;  Surgeon: Norleen Armor, MD;  Location: [facility] OR;  Service: Orthopedics;  Laterality: Right;   AMPUTATION TOE Left 08/18/2022   Procedure: AMPUTATION TOE;  Surgeon: Janit Thresa HERO, DPM;  Location: [facility];  Service: Podiatry;  Laterality: Left;   AUGMENTATION MAMMAPLASTY Bilateral    BREAST ENHANCEMENT SURGERY Bilateral     CARDIAC CATHETERIZATION  01/04/2012   subtotally occluded small 2nd marginal branch & 75% PDA lesion, normal LV function   CATARACT EXTRACTION Bilateral    with lens implants   CHOLECYSTECTOMY  2008   COLONOSCOPY     COLONOSCOPY WITH PROPOFOL  N/A 05/12/2016   Procedure: COLONOSCOPY WITH PROPOFOL ;  Surgeon: Renaye Sous, MD;  Location: [facility];  Service: Endoscopy;  Laterality: N/A;   I & D KNEE WITH POLY EXCHANGE Left 05/13/2015   Procedure: IRRIGATION AND DEBRIDEMENT KNEE WITH POLY EXCHANGE;  Surgeon: Fonda Olmsted, MD;  Location: [facility] OR;  Service: Orthopedics;  Laterality: Left;   LEFT HEART CATHETERIZATION WITH CORONARY ANGIOGRAM N/A 01/04/2012   Procedure: LEFT HEART CATHETERIZATION WITH CORONARY ANGIOGRAM;  Surgeon: Dorn JINNY Lesches, MD;  [facility] CATH LAB;  Service: Cardiovascular;  Laterality: N/A;   PARTIAL KNEE ARTHROPLASTY Left 04/24/2015   Procedure: LEFT UNICOMPARTMENTAL KNEE ARTHROPLASTY ;  Surgeon: Fonda Olmsted, MD;  Location: [facility];  Service: Orthopedics;  Laterality: Left;   PARTIAL KNEE ARTHROPLASTY Right 07/02/2015   Procedure: RIGHT UNI KNEE ARTHROPLASTY;  Surgeon: Fonda Olmsted, MD;  Location: [facility];  Service: Orthopedics;  Laterality: Right;  ANESTHESIA:  GENERAL, PRE/POST OP FEMORAL NERVE   POSTERIOR CERVICAL FUSION/FORAMINOTOMY  1990   ROBOTIC ASSISTED TOTAL HYSTERECTOMY WITH BILATERAL SALPINGO OOPHERECTOMY Bilateral 02/17/2015   Procedure: ROBOTIC ASSISTED TOTAL HYSTERECTOMY WITH BILATERAL SALPINGO OOPHORECTOMY;  Surgeon: Maurilio Ship, MD;  Location: [facility];  Service: Gynecology;  Laterality: Bilateral;    FAMILY HISTORY: Family History  Problem Relation Age of Onset   Stroke Mother    Hypertension Mother    Diabetes Mother    Alzheimer's disease Mother    Diabetes Father    COPD Father        vent-dependent, MODS   Hypertension Sister    Mental illness Sister        borderline personality d/o   Mental illness Sister         schizoeffective d/o   Diabetes Sister    Sleep apnea Neg Hx     SOCIAL HISTORY: Social History   Socioeconomic History  Marital status: Married    Spouse name: Not on file   Number of children: Not on file   Years of education: 14   Highest education level: Not on file  Occupational History   Occupation: respiratory therapist    Employer: OTHER    Comment: [employer]  Tobacco Use   Smoking status: Every Day    Current packs/day: 1.00    Average packs/day: 1 pack/day for 25.0 years (25.0 ttl pk-yrs)    Types: Cigarettes   Smokeless tobacco: Never   Tobacco comments:    quit April 2021  Vaping Use   Vaping status: Never Used  Substance and Sexual Activity   Alcohol  use: No    Alcohol /week: 0.0 standard drinks of alcohol    Drug use: No   Sexual activity: Not on file  Other Topics Concern   Not on file  Social History Narrative   Not on file   Social Drivers of Health   Financial Resource Strain: Not on file  Food Insecurity: No Food Insecurity (08/17/2022)   Hunger Vital Sign    Worried About Running Out of Food in the Last Year: Never true    Ran Out of Food in the Last Year: Never true  Transportation Needs: No Transportation Needs (08/17/2022)   PRAPARE - Administrator, Civil Service (Medical): No    Lack of Transportation (Non-Medical): No  Physical Activity: Not on file  Stress: Not on file  Social Connections: Not on file  Intimate Partner Violence: Not At Risk (08/17/2022)   Humiliation, Afraid, Rape, and Kick questionnaire    Fear of Current or Ex-Partner: No    Emotionally Abused: No    Physically Abused: No    Sexually Abused: No    PHYSICAL EXAM  Vitals:   10/18/23 1033 10/18/23 1036  BP: (!) 158/80 (!) 154/78  Pulse: 66   SpO2: 96%   Weight: 269 lb (122 kg)   Height: 5' 7 (1.702 m)     Body mass index is 42.13 kg/m.  Generalized: Well developed, in no acute distress  Neurological examination   Mentation: Alert oriented to time, place, history taking. Follows all commands speech and language fluent Cranial nerve II-XII: Pupils were equal round reactive to light. Extraocular movements were full, visual field were full on confrontational test. Facial sensation and strength were normal. Head turning and shoulder shrug  were normal and symmetric. Motor: Moves all extremities independent Gait and station: Gait is normal.  DIAGNOSTIC DATA (LABS, IMAGING, TESTING) - I reviewed patient records, labs, notes, testing and imaging myself where available.  Lab Results  Component Value Date   WBC 10.2 08/19/2022   HGB 12.5 08/19/2022   HCT 38.2 08/19/2022   MCV 100.8 (H) 08/19/2022   PLT 231 08/19/2022      Component Value Date/Time   NA 137 08/19/2022 0316   K 4.0 08/19/2022 0316   CL 105 08/19/2022 0316   CO2 24 08/19/2022 0316   GLUCOSE 86 08/19/2022 0316   BUN 24 (H) 08/19/2022 0316   CREATININE 0.88 08/19/2022 0316   CALCIUM  9.7 08/19/2022 0316   PROT 7.7 08/16/2022 1954   ALBUMIN 3.7 08/16/2022 1954   AST 15 08/16/2022 1954   ALT 14 08/16/2022 1954   ALKPHOS 116 08/16/2022 1954   BILITOT 0.8 08/16/2022 1954   GFRNONAA >60 08/19/2022 0316   GFRAA >60 07/26/2019 0314   No results found for: CHOL, HDL, LDLCALC, LDLDIRECT, TRIG, CHOLHDL Lab Results  Component  Value Date   HGBA1C 7.0 (H) 08/16/2022   No results found for: [id number] Lab Results  Component Value Date   TSH 3.708 08/17/2022    Lauraine Born, AGNP-C, DNP 10/18/2023, 10:49 AM [facility] [address] [place], [place] [address] [phone]

## 2023-10-18 NOTE — Patient Instructions (Signed)
 Great to see you today! Continue CPAP usage minimum 4 hours nightly Continue current settings Continue to replace supplies routinely through DME Follow-up in 1 year or sooner if needed. Thanks!!

## 2023-10-20 DIAGNOSIS — F331 Major depressive disorder, recurrent, moderate: Secondary | ICD-10-CM | POA: Diagnosis not present

## 2023-10-27 DIAGNOSIS — F331 Major depressive disorder, recurrent, moderate: Secondary | ICD-10-CM | POA: Diagnosis not present

## 2023-11-08 DIAGNOSIS — Z6841 Body Mass Index (BMI) 40.0 and over, adult: Secondary | ICD-10-CM | POA: Diagnosis not present

## 2023-11-08 DIAGNOSIS — G479 Sleep disorder, unspecified: Secondary | ICD-10-CM | POA: Diagnosis not present

## 2023-11-08 DIAGNOSIS — I10 Essential (primary) hypertension: Secondary | ICD-10-CM | POA: Diagnosis not present

## 2023-11-08 DIAGNOSIS — N951 Menopausal and female climacteric states: Secondary | ICD-10-CM | POA: Diagnosis not present

## 2023-11-08 DIAGNOSIS — E1140 Type 2 diabetes mellitus with diabetic neuropathy, unspecified: Secondary | ICD-10-CM | POA: Diagnosis not present

## 2023-11-09 DIAGNOSIS — E1129 Type 2 diabetes mellitus with other diabetic kidney complication: Secondary | ICD-10-CM | POA: Diagnosis not present

## 2023-11-09 DIAGNOSIS — Z4681 Encounter for fitting and adjustment of insulin pump: Secondary | ICD-10-CM | POA: Diagnosis not present

## 2023-11-09 DIAGNOSIS — E785 Hyperlipidemia, unspecified: Secondary | ICD-10-CM | POA: Diagnosis not present

## 2023-11-09 DIAGNOSIS — Z794 Long term (current) use of insulin: Secondary | ICD-10-CM | POA: Diagnosis not present

## 2023-11-09 DIAGNOSIS — I10 Essential (primary) hypertension: Secondary | ICD-10-CM | POA: Diagnosis not present

## 2023-11-09 DIAGNOSIS — F17200 Nicotine dependence, unspecified, uncomplicated: Secondary | ICD-10-CM | POA: Diagnosis not present

## 2023-11-09 DIAGNOSIS — Z23 Encounter for immunization: Secondary | ICD-10-CM | POA: Diagnosis not present

## 2023-11-10 DIAGNOSIS — F331 Major depressive disorder, recurrent, moderate: Secondary | ICD-10-CM | POA: Diagnosis not present

## 2023-11-17 DIAGNOSIS — F331 Major depressive disorder, recurrent, moderate: Secondary | ICD-10-CM | POA: Diagnosis not present

## 2023-11-20 ENCOUNTER — Ambulatory Visit (INDEPENDENT_AMBULATORY_CARE_PROVIDER_SITE_OTHER): Admitting: Podiatry

## 2023-11-20 ENCOUNTER — Ambulatory Visit (INDEPENDENT_AMBULATORY_CARE_PROVIDER_SITE_OTHER)

## 2023-11-20 ENCOUNTER — Encounter: Payer: Self-pay | Admitting: Podiatry

## 2023-11-20 VITALS — Ht 67.0 in | Wt 269.0 lb

## 2023-11-20 DIAGNOSIS — L02611 Cutaneous abscess of right foot: Secondary | ICD-10-CM | POA: Diagnosis not present

## 2023-11-20 DIAGNOSIS — M2141 Flat foot [pes planus] (acquired), right foot: Secondary | ICD-10-CM

## 2023-11-20 DIAGNOSIS — L03031 Cellulitis of right toe: Secondary | ICD-10-CM

## 2023-11-20 DIAGNOSIS — L97512 Non-pressure chronic ulcer of other part of right foot with fat layer exposed: Secondary | ICD-10-CM

## 2023-11-20 DIAGNOSIS — E08621 Diabetes mellitus due to underlying condition with foot ulcer: Secondary | ICD-10-CM | POA: Diagnosis not present

## 2023-11-20 DIAGNOSIS — M2142 Flat foot [pes planus] (acquired), left foot: Secondary | ICD-10-CM

## 2023-11-20 MED ORDER — GENTAMICIN SULFATE 0.1 % EX CREA: 1.0000 | TOPICAL_CREAM | Freq: Two times a day (BID) | CUTANEOUS | 1 refills | Status: DC

## 2023-11-20 MED ORDER — DOXYCYCLINE HYCLATE 100 MG PO TABS: 100.0000 mg | ORAL_TABLET | Freq: Two times a day (BID) | ORAL | 0 refills | Status: DC

## 2023-11-23 ENCOUNTER — Telehealth: Payer: Self-pay | Admitting: Lab

## 2023-11-23 DIAGNOSIS — F332 Major depressive disorder, recurrent severe without psychotic features: Secondary | ICD-10-CM | POA: Diagnosis not present

## 2023-11-23 NOTE — Telephone Encounter (Signed)
 Patient is calling stating was suppose to have an ointment called in and not at pharmacy yet.

## 2023-11-24 DIAGNOSIS — F331 Major depressive disorder, recurrent, moderate: Secondary | ICD-10-CM | POA: Diagnosis not present

## 2023-11-27 NOTE — Progress Notes (Signed)
 Chief Complaint  Patient presents with   Wound Check    Pt is here due to right foot, states the area is red and swollen has been bleeding.    SUBJECTIVE Patient with a history of diabetes mellitus and multiple prior toe amputations secondary to osteomyelitis presenting for follow-up for routine footcare.  Patient doing very well.  She denies any wounds to the toes or changes in the condition of her feet since last visit.  Past Medical History:  Diagnosis Date   Anxiety    Arthritis    knees   Coronary artery disease    Depression    Fatty liver    History of nuclear stress test 12/16/2011   exercise myoview; normal images with 2-24mm ST-segment depression - subsequent cath revelaed subtotally occluded small 2nd marginal branch & 75% PDA lesion, normal LV function   Hypertension    Hypothyroidism    Neuropathy    in feet   OSA on CPAP    AHI = 44 (per patient) setting 13 per pt   Personal history of kidney stones    Primary localized osteoarthritis of left knee 04/24/2015   Primary localized osteoarthritis of right knee 07/02/2015   Tobacco abuse    Type 2 diabetes mellitus (HCC)    insulin  pump    Allergies  Allergen Reactions   Penicillins Hives    Did it involve swelling of the face/tongue/throat, SOB, or low BP? No  Did it involve sudden or severe rash/hives, skin peeling, or any reaction on the inside of your mouth or nose? Yes  Did you need to seek medical attention at a hospital or doctor's office? No  When did it last happen?  childhood      If all above answers are NO, may proceed with cephalosporin use.  Other Reaction(s): Not available    RT foot 11/20/2023  OBJECTIVE General Patient is awake, alert, and oriented x 3 and in no acute distress. Derm new ulcer with surrounding cellulitis noted to the plantar aspect of the first MTP of the right foot.  There was an extensive amount of necrotic and fibrotic tissue within the wound.  No malodor.   Serosanguineous drainage.  After debridement there is healthy underlying granular tissue.  No exposed bone muscle tendon ligament or joint for the moment.  Surrounding wound is callused and erythematous.  The erythema is only localized to the great toe joint Vasc  DP and PT pedal pulses palpable bilaterally.  Erythema with edema noted localized to the first MTP of the right foot Neuro light touch and protective threshold sensation absent bilaterally.  Musculoskeletal Exam history of multiple prior amputations bilateral lesser digits.  Hammertoe contracture noted to the remaining digits Radiographic exam RT foot 11/20/2023 interval amputation of the 2nd and 3rd toes at the level of the MTP without complication.  Chronic contracture of the MTP and IPJ of the great toe with degenerative changes.  Possible subacute fracture of the plantar aspect at the base of the proximal phalanx best visualized on oblique view.  No osseous erosions or gas within the tissue that would be concerning for acute active osteomyelitis or gas gangrene  ASSESSMENT 1.  New onset of diabetic foot ulcer plantar aspect of the first MTP right 2.  Cellulitis right foot  3.  History of multiple prior toe amputations bilateral 4.  Hammertoe lesser digits bilateral  PLAN OF CARE -Patient evaluated today.  X-rays reviewed -Medically necessary excisional debridement including subcutaneous tissue was  performed today using a tissue nipper.  Excisional debridement of the necrotic nonviable tissue down to healthier bleeding viable tissue was performed with postdebridement measurements are as pre- -Prescription for doxycycline  100 mg twice daily x 2 weeks -Prescription for gentamicin  cream apply daily with a light dressing -Resume postop shoe -Return to clinic 2 weeks.  If she begins to feel any systemic signs of infection including fever chills malaise general lethargy or increased redness and swelling extending to the other parts of the  foot recommend she goes immediately to the emergency department    Thresa EMERSON Sar, DPM [facility]  Dr. Thresa EMERSON Sar, DPM    [address] [place], [place] [address]                Office [phone]  Fax [phone]

## 2023-11-28 DIAGNOSIS — F332 Major depressive disorder, recurrent severe without psychotic features: Secondary | ICD-10-CM | POA: Diagnosis not present

## 2023-11-29 DIAGNOSIS — F332 Major depressive disorder, recurrent severe without psychotic features: Secondary | ICD-10-CM | POA: Diagnosis not present

## 2023-11-30 DIAGNOSIS — F332 Major depressive disorder, recurrent severe without psychotic features: Secondary | ICD-10-CM | POA: Diagnosis not present

## 2023-12-01 DIAGNOSIS — F331 Major depressive disorder, recurrent, moderate: Secondary | ICD-10-CM | POA: Diagnosis not present

## 2023-12-01 DIAGNOSIS — F332 Major depressive disorder, recurrent severe without psychotic features: Secondary | ICD-10-CM | POA: Diagnosis not present

## 2023-12-04 ENCOUNTER — Ambulatory Visit: Admitting: Podiatry

## 2023-12-04 VITALS — Ht 67.0 in | Wt 269.0 lb

## 2023-12-04 DIAGNOSIS — E08621 Diabetes mellitus due to underlying condition with foot ulcer: Secondary | ICD-10-CM | POA: Diagnosis not present

## 2023-12-04 DIAGNOSIS — F332 Major depressive disorder, recurrent severe without psychotic features: Secondary | ICD-10-CM | POA: Diagnosis not present

## 2023-12-04 DIAGNOSIS — L97512 Non-pressure chronic ulcer of other part of right foot with fat layer exposed: Secondary | ICD-10-CM

## 2023-12-04 NOTE — Progress Notes (Signed)
 Chief Complaint  Patient presents with   Diabetic Ulcer    Rm 6 Patient is here for diabetic ulcer of the right foot. Wound is open with moderate drainage.    SUBJECTIVE Patient with a history of diabetes mellitus and multiple prior toe amputations secondary to osteomyelitis presenting for follow-up for routine footcare.  Patient doing very well.  She denies any wounds to the toes or changes in the condition of her feet since last visit.  Past Medical History:  Diagnosis Date   Anxiety    Arthritis    knees   Coronary artery disease    Depression    Fatty liver    History of nuclear stress test 12/16/2011   exercise myoview; normal images with 2-42mm ST-segment depression - subsequent cath revelaed subtotally occluded small 2nd marginal branch & 75% PDA lesion, normal LV function   Hypertension    Hypothyroidism    Neuropathy    in feet   OSA on CPAP    AHI = 44 (per patient) setting 13 per pt   Personal history of kidney stones    Primary localized osteoarthritis of left knee 04/24/2015   Primary localized osteoarthritis of right knee 07/02/2015   Tobacco abuse    Type 2 diabetes mellitus (HCC)    insulin  pump    Allergies  Allergen Reactions   Penicillins Hives    Did it involve swelling of the face/tongue/throat, SOB, or low BP? No  Did it involve sudden or severe rash/hives, skin peeling, or any reaction on the inside of your mouth or nose? Yes  Did you need to seek medical attention at a hospital or doctor's office? No  When did it last happen?  childhood      If all above answers are NO, may proceed with cephalosporin use.  Other Reaction(s): Not available    RT foot 11/20/2023   RT foot 12/04/2023  OBJECTIVE General Patient is awake, alert, and oriented x 3 and in no acute distress. Derm the cellulitis around the great toe is resolved.  There is no erythema or edema noted.  The wound appears very stable with good improvement.  Granular wound base.  No  exposed bone muscle tendon ligament or joint for the moment.  Surrounding wound is callused  Vasc  DP and PT pedal pulses palpable bilaterally.  Neuro light touch and protective threshold sensation absent bilaterally.  Musculoskeletal Exam history of multiple prior amputations bilateral lesser digits.  Hammertoe contracture noted to the remaining digits Radiographic exam RT foot 11/20/2023 interval amputation of the 2nd and 3rd toes at the level of the MTP without complication.  Chronic contracture of the MTP and IPJ of the great toe with degenerative changes.  Possible subacute fracture of the plantar aspect at the base of the proximal phalanx best visualized on oblique view.  No osseous erosions or gas within the tissue that would be concerning for acute active osteomyelitis or gas gangrene  ASSESSMENT 1.  Diabetic ulcer first MTP right 2.  Cellulitis right foot; resolved  PLAN OF CARE -Patient evaluated today.  -Medically necessary excisional debridement including subcutaneous tissue was performed today using a tissue nipper.  Excisional debridement of the necrotic nonviable tissue down to healthier bleeding viable tissue was performed with postdebridement measurements are as pre- -patient completed the oral doxycycline  x 2 weeks prescribed on 11/20/2023 - Unable to pick up the prescription for the gentamicin  cream to the pharmacy.  She is going back to the pharmacy.  This cream was prescribed on 11/20/2023 - Continue postop shoe or diabetic shoes with custom molded insoles -Return to clinic 3 weeks    Thresa EMERSON Sar, DPM [facility]  Dr. Thresa EMERSON Sar, DPM    [address] [place], [place] [address]                Office [phone]  Fax [phone]

## 2023-12-05 DIAGNOSIS — F332 Major depressive disorder, recurrent severe without psychotic features: Secondary | ICD-10-CM | POA: Diagnosis not present

## 2023-12-06 DIAGNOSIS — F332 Major depressive disorder, recurrent severe without psychotic features: Secondary | ICD-10-CM | POA: Diagnosis not present

## 2023-12-06 DIAGNOSIS — E611 Iron deficiency: Secondary | ICD-10-CM | POA: Diagnosis not present

## 2023-12-06 DIAGNOSIS — Z6841 Body Mass Index (BMI) 40.0 and over, adult: Secondary | ICD-10-CM | POA: Diagnosis not present

## 2023-12-07 DIAGNOSIS — F332 Major depressive disorder, recurrent severe without psychotic features: Secondary | ICD-10-CM | POA: Diagnosis not present

## 2023-12-08 DIAGNOSIS — F331 Major depressive disorder, recurrent, moderate: Secondary | ICD-10-CM | POA: Diagnosis not present

## 2023-12-08 DIAGNOSIS — F332 Major depressive disorder, recurrent severe without psychotic features: Secondary | ICD-10-CM | POA: Diagnosis not present

## 2023-12-11 DIAGNOSIS — F332 Major depressive disorder, recurrent severe without psychotic features: Secondary | ICD-10-CM | POA: Diagnosis not present

## 2023-12-12 DIAGNOSIS — F332 Major depressive disorder, recurrent severe without psychotic features: Secondary | ICD-10-CM | POA: Diagnosis not present

## 2023-12-13 DIAGNOSIS — F332 Major depressive disorder, recurrent severe without psychotic features: Secondary | ICD-10-CM | POA: Diagnosis not present

## 2023-12-14 DIAGNOSIS — F332 Major depressive disorder, recurrent severe without psychotic features: Secondary | ICD-10-CM | POA: Diagnosis not present

## 2023-12-15 DIAGNOSIS — F332 Major depressive disorder, recurrent severe without psychotic features: Secondary | ICD-10-CM | POA: Diagnosis not present

## 2023-12-15 DIAGNOSIS — F331 Major depressive disorder, recurrent, moderate: Secondary | ICD-10-CM | POA: Diagnosis not present

## 2023-12-18 DIAGNOSIS — F332 Major depressive disorder, recurrent severe without psychotic features: Secondary | ICD-10-CM | POA: Diagnosis not present

## 2023-12-19 DIAGNOSIS — F332 Major depressive disorder, recurrent severe without psychotic features: Secondary | ICD-10-CM | POA: Diagnosis not present

## 2023-12-20 DIAGNOSIS — F332 Major depressive disorder, recurrent severe without psychotic features: Secondary | ICD-10-CM | POA: Diagnosis not present

## 2023-12-21 DIAGNOSIS — F332 Major depressive disorder, recurrent severe without psychotic features: Secondary | ICD-10-CM | POA: Diagnosis not present

## 2023-12-22 DIAGNOSIS — F332 Major depressive disorder, recurrent severe without psychotic features: Secondary | ICD-10-CM | POA: Diagnosis not present

## 2023-12-25 ENCOUNTER — Encounter: Payer: Self-pay | Admitting: Podiatry

## 2023-12-25 ENCOUNTER — Ambulatory Visit (INDEPENDENT_AMBULATORY_CARE_PROVIDER_SITE_OTHER): Admitting: Podiatry

## 2023-12-25 VITALS — Ht 67.0 in | Wt 269.0 lb

## 2023-12-25 DIAGNOSIS — E08621 Diabetes mellitus due to underlying condition with foot ulcer: Secondary | ICD-10-CM

## 2023-12-25 DIAGNOSIS — F332 Major depressive disorder, recurrent severe without psychotic features: Secondary | ICD-10-CM | POA: Diagnosis not present

## 2023-12-25 DIAGNOSIS — M79675 Pain in left toe(s): Secondary | ICD-10-CM | POA: Diagnosis not present

## 2023-12-25 DIAGNOSIS — B351 Tinea unguium: Secondary | ICD-10-CM | POA: Diagnosis not present

## 2023-12-25 DIAGNOSIS — L97512 Non-pressure chronic ulcer of other part of right foot with fat layer exposed: Secondary | ICD-10-CM

## 2023-12-25 DIAGNOSIS — M79674 Pain in right toe(s): Secondary | ICD-10-CM

## 2023-12-25 NOTE — Progress Notes (Signed)
 Chief Complaint  Patient presents with   Diabetic Ulcer    Pt is here to f/u on right foot due to diabetic ulcer, she states everything is going well, still has some drainage.    SUBJECTIVE Patient with a history of diabetes mellitus and multiple prior toe amputations secondary to osteomyelitis presenting for follow-up evaluation of an ulcer to the right foot.  She has been applying gentamicin  cream and wearing her diabetic shoes and insoles.  Patient also requesting routine nail care today.  Her nails are elongated and she is unable to trim her own nails.  Past Medical History:  Diagnosis Date   Anxiety    Arthritis    knees   Coronary artery disease    Depression    Fatty liver    History of nuclear stress test 12/16/2011   exercise myoview; normal images with 2-4mm ST-segment depression - subsequent cath revelaed subtotally occluded small 2nd marginal branch & 75% PDA lesion, normal LV function   Hypertension    Hypothyroidism    Neuropathy    in feet   OSA on CPAP    AHI = 44 (per patient) setting 13 per pt   Personal history of kidney stones    Primary localized osteoarthritis of left knee 04/24/2015   Primary localized osteoarthritis of right knee 07/02/2015   Tobacco abuse    Type 2 diabetes mellitus (HCC)    insulin  pump    Allergies  Allergen Reactions   Penicillins Hives    Did it involve swelling of the face/tongue/throat, SOB, or low BP? No  Did it involve sudden or severe rash/hives, skin peeling, or any reaction on the inside of your mouth or nose? Yes  Did you need to seek medical attention at a hospital or doctor's office? No  When did it last happen?  childhood      If all above answers are NO, may proceed with cephalosporin use.  Other Reaction(s): Not available    RT foot 11/20/2023   RT foot 12/04/2023  OBJECTIVE General Patient is awake, alert, and oriented x 3 and in no acute distress. Derm the cellulitis around the great toe is  resolved.  There is no erythema or edema noted.  The wound appears very stable with good improvement.  Granular wound base.  No exposed bone muscle tendon ligament or joint for the moment.   Wound noted to the plantar aspect of the first MTP now measures approximately 1.0 x 1.0 x 0.6 cm.  There is some undermining however it does not probe to bone.  Healthy bleeding viable tissue noted.  Periwound is slightly macerated and callused. Vasc  DP and PT pedal pulses palpable bilaterally.  Neuro light touch and protective threshold sensation absent bilaterally.  Musculoskeletal Exam history of multiple prior amputations bilateral lesser digits.  Hammertoe contracture noted to the remaining digits Radiographic exam RT foot 11/20/2023 interval amputation of the 2nd and 3rd toes at the level of the MTP without complication.  Chronic contracture of the MTP and IPJ of the great toe with degenerative changes.  Possible subacute fracture of the plantar aspect at the base of the proximal phalanx best visualized on oblique view.  No osseous erosions or gas within the tissue that would be concerning for acute active osteomyelitis or gas gangrene  ASSESSMENT 1.  Diabetic ulcer right foot with fat layer exposed 2.  Cellulitis right foot; resolved 3.  Pain due to onychomycosis of toenails both  PLAN OF CARE -Patient  evaluated today.  -Mechanical debridement of nails 1-5 bilateral performed and nail nipper without incident or bleeding -Medically necessary excisional debridement including subcutaneous tissue was performed today using a tissue nipper.  Excisional debridement of the necrotic nonviable tissue down to healthier bleeding viable tissue was performed with postdebridement measurements are as pre- -patient completed the oral doxycycline  x 2 weeks prescribed on 11/20/2023.  Currently not taking any antibiotics - Continue gentamicin  cream twice daily with sterile dressings - Continue postop shoe or diabetic shoes  with custom molded insoles -Return to clinic 3 weeks    Thresa EMERSON Sar, DPM [facility]  Dr. Thresa EMERSON Sar, DPM    [address] [place], [place] [address]                Office [phone]  Fax [phone]

## 2023-12-26 DIAGNOSIS — F332 Major depressive disorder, recurrent severe without psychotic features: Secondary | ICD-10-CM | POA: Diagnosis not present

## 2023-12-27 DIAGNOSIS — F332 Major depressive disorder, recurrent severe without psychotic features: Secondary | ICD-10-CM | POA: Diagnosis not present

## 2023-12-28 ENCOUNTER — Ambulatory Visit

## 2023-12-28 ENCOUNTER — Ambulatory Visit (INDEPENDENT_AMBULATORY_CARE_PROVIDER_SITE_OTHER): Admitting: Podiatry

## 2023-12-28 ENCOUNTER — Encounter: Payer: Self-pay | Admitting: Podiatry

## 2023-12-28 DIAGNOSIS — F332 Major depressive disorder, recurrent severe without psychotic features: Secondary | ICD-10-CM | POA: Diagnosis not present

## 2023-12-28 DIAGNOSIS — E0842 Diabetes mellitus due to underlying condition with diabetic polyneuropathy: Secondary | ICD-10-CM | POA: Diagnosis not present

## 2023-12-28 DIAGNOSIS — L97513 Non-pressure chronic ulcer of other part of right foot with necrosis of muscle: Secondary | ICD-10-CM

## 2023-12-28 MED ORDER — GENTAMICIN SULFATE 0.1 % EX CREA: 1.0000 | TOPICAL_CREAM | Freq: Two times a day (BID) | CUTANEOUS | 1 refills | Status: AC

## 2023-12-28 NOTE — Patient Instructions (Addendum)
 Instructions for Wound Care  The most important step to healing a foot wound is to reduce the pressure on your foot - it is extremely important to stay off your foot as much as possible and wear the shoe/boot as instructed.  Cleanse your foot with saline wash or warm soapy water  (dial antibacterial soap or similar).  Blot dry.  Apply prescribed medication to your wound and cover with gauze and a bandage.  May hold bandage in place with Coban (self sticky wrap), Ace bandage or tape.  You may find dressing supplies at your local [employer], [employer], drug store or medical supply store.  Monitor for any signs/symptoms of infection. If there is any increase in redness, red streaks, increase in drainage, warmth to your foot please give us  a call. Also, if you start to run a fever or have flu-like symptoms that can also be a sign of infection. Call the office immediately if any occur or go directly to the emergency room.   Keep off your foot as much as possible. Use offloading felt padding such as a dancers pad to help protect the ulceration  If you have any questions, please feel free to give us  a call at [phone] or if you are on MyChart you can always send me a message if needed.

## 2023-12-28 NOTE — Progress Notes (Unsigned)
 Chief Complaint  Patient presents with   Wound Check    R foot ulcer open, exposed fat layer, drainage.  Diabetic A1c 6.1 pt stated. No anti coag    HPI: 68 y.o. female presenting today following up for ulcer check.  She did see Dr. Janit earlier this week for right subfirst metatarsal head ulceration.  She noticed increased maceration around the area and is concerned that she got the area wet, she comes in to have it evaluated.  She reports misplacing the gentamicin  cream.  She has been taping gauze over the area and ambulating in diabetic shoes.  History of multiple digital amputations secondary to osteomyelitis and diabetic neuropathy.  Past Medical History:  Diagnosis Date   Anxiety    Arthritis    knees   Coronary artery disease    Depression    Fatty liver    History of nuclear stress test 12/16/2011   exercise myoview; normal images with 2-28mm ST-segment depression - subsequent cath revelaed subtotally occluded small 2nd marginal branch & 75% PDA lesion, normal LV function   Hypertension    Hypothyroidism    Neuropathy    in feet   OSA on CPAP    AHI = 44 (per patient) setting 13 per pt   Personal history of kidney stones    Primary localized osteoarthritis of left knee 04/24/2015   Primary localized osteoarthritis of right knee 07/02/2015   Tobacco abuse    Type 2 diabetes mellitus (HCC)    insulin  pump   Past Surgical History:  Procedure Laterality Date   3rd right toe amputation  02/2016   AMPUTATION Right 12/22/2014   Procedure: RIGHT 2ND TOE AMPUTATION;  Surgeon: Norleen Armor, MD;  Location: [facility] OR;  Service: Orthopedics;  Laterality: Right;   AMPUTATION TOE Left 08/18/2022   Procedure: AMPUTATION TOE;  Surgeon: Janit Thresa HERO, DPM;  Location: [facility];  Service: Podiatry;  Laterality: Left;   AUGMENTATION MAMMAPLASTY Bilateral    BREAST ENHANCEMENT SURGERY Bilateral    CARDIAC CATHETERIZATION  01/04/2012   subtotally occluded small 2nd marginal branch & 75% PDA  lesion, normal LV function   CATARACT EXTRACTION Bilateral    with lens implants   CHOLECYSTECTOMY  2008   COLONOSCOPY     COLONOSCOPY WITH PROPOFOL  N/A 05/12/2016   Procedure: COLONOSCOPY WITH PROPOFOL ;  Surgeon: Renaye Sous, MD;  Location: [facility];  Service: Endoscopy;  Laterality: N/A;   I & D KNEE WITH POLY EXCHANGE Left 05/13/2015   Procedure: IRRIGATION AND DEBRIDEMENT KNEE WITH POLY EXCHANGE;  Surgeon: Fonda Olmsted, MD;  Location: [facility] OR;  Service: Orthopedics;  Laterality: Left;   LEFT HEART CATHETERIZATION WITH CORONARY ANGIOGRAM N/A 01/04/2012   Procedure: LEFT HEART CATHETERIZATION WITH CORONARY ANGIOGRAM;  Surgeon: Dorn JINNY Lesches, MD;  Location: [facility] CATH LAB;  Service: Cardiovascular;  Laterality: N/A;   PARTIAL KNEE ARTHROPLASTY Left 04/24/2015   Procedure: LEFT UNICOMPARTMENTAL KNEE ARTHROPLASTY ;  Surgeon: Fonda Olmsted, MD;  Location: [facility];  Service: Orthopedics;  Laterality: Left;   PARTIAL KNEE ARTHROPLASTY Right 07/02/2015   Procedure: RIGHT UNI KNEE ARTHROPLASTY;  Surgeon: Fonda Olmsted, MD;  Location: [facility];  Service: Orthopedics;  Laterality: Right;  ANESTHESIA:  GENERAL, PRE/POST OP FEMORAL NERVE   POSTERIOR CERVICAL FUSION/FORAMINOTOMY  1990   ROBOTIC ASSISTED TOTAL HYSTERECTOMY WITH BILATERAL SALPINGO OOPHERECTOMY Bilateral 02/17/2015   Procedure: ROBOTIC ASSISTED TOTAL HYSTERECTOMY WITH BILATERAL SALPINGO OOPHORECTOMY;  Surgeon: Maurilio Ship, MD;  Location: [facility];  Service: Gynecology;  Laterality: Bilateral;   Allergies  Allergen Reactions   Penicillins Hives    Did it involve swelling of the face/tongue/throat, SOB, or low BP? No  Did it involve sudden or severe rash/hives, skin peeling, or any reaction on the inside of your mouth or nose? Yes  Did you need to seek medical attention at a hospital or doctor's office? No  When did it last happen?  childhood      If all above answers are NO, may proceed with  cephalosporin use.  Other Reaction(s): Not available      PHYSICAL EXAM: There were no vitals filed for this visit.  General: The patient is alert and oriented x3 in no acute distress.  Dermatology: Skin is warm, dry and supple bilateral lower extremities. Interspaces are clear of maceration and debris.      Wound 1:  Location: Right subfirst metatarsal head        Depth: Probes deep to muscle fascia        Wound Border: Regular, macerated        Wound Base: Fibrogranular        Drainage: Scant serous       Odor?:  No        Surrounding Tissue: Nonerythematous,        Infected?:  No surrounding erythema        Necrosis?:  Significant wound depth suggestive of necrosis of the subcutaneous and muscle fascia tissue        Pain?:  No        Tunneling: No       Dimensions (cm): 0.8 x 0.8 x 1.2 cm   Vascular: DP and PT pulses palpable 2/4.  Cap refill intact of digits  Neurological: Light touch sensation diminished to the toes.  Protective sensation decreased.  Musculoskeletal Exam: History of multiple prior amputations to the lesser digits bilaterally.  Hammertoe contractures noted to the remaining digits.  Prominent first met head.  Significant bunion deformity present.        No data to display           RADIOGRAPHIC EXAM: Right foot 3 views weightbearing 12/28/2023 Normal osseous mineralization. Significant bunion deformity present.  Associated deformity of right hallux noted with arthritic first IPJ and hallux malleus contracture noted.  Arthritic changes to the midfoot noted.  No osteolysis, cortical erosion or periosteal reaction appreciated unchecking prior radiographs.  ASSESSMENT / PLAN OF CARE: 1. Ulcer of right foot with necrosis of muscle (HCC)   2. Diabetes mellitus due to underlying condition with diabetic polyneuropathy, unspecified whether long term insulin  use (HCC)      Meds ordered this encounter  Medications   gentamicin  cream (GARAMYCIN ) 0.1 %     Sig: Apply 1 Application topically 2 (two) times daily.    Dispense:  30 g    Refill:  1   None   The ulceration was sharply debrided of hyperkeratotic and devitalized soft tissue with sterile # 15 blade and curette to the level of muscle fascia layer.  Hemostasis obtained.  She did have recent debridement in the office as well.  Antibiotic ointment, Hydrofera Blue and padded bordered foam bandage applied.  Dancers pad applied as well.  Reviewed off-loading with patient.  New prescription for gentamicin  cream sent to patient's pharmacy.  Reviewed daily dressing changes with patient.  Discussed importance of offloading and limited stance and gait with patient due to extensive neuropathy.  Wound does have significant depth, low threshold to  order MRI to assess for osteomyelitis.  Monitor closely for signs of infection and contact office if this does develop.  Discussed risks / concerns regarding ulcer with patient and possible sequelae if left untreated.  Stressed importance of infection prevention at home. Short-term goals are: prevent infection, off-load ulcer, heal ulcer Long-term goals are:  prevent recurrence, prevent amputation.   Return for Wound Care.  Keep upcoming appointment with Dr. Janit in about 2 weeks.   Ethan Saddler, DPM, AACFAS [facility]     [address] [place], [place] [address]                Office [phone]  Fax [phone]

## 2023-12-29 DIAGNOSIS — F332 Major depressive disorder, recurrent severe without psychotic features: Secondary | ICD-10-CM | POA: Diagnosis not present

## 2023-12-29 DIAGNOSIS — F331 Major depressive disorder, recurrent, moderate: Secondary | ICD-10-CM | POA: Diagnosis not present

## 2024-01-01 ENCOUNTER — Ambulatory Visit: Admitting: Podiatry

## 2024-01-01 DIAGNOSIS — F332 Major depressive disorder, recurrent severe without psychotic features: Secondary | ICD-10-CM | POA: Diagnosis not present

## 2024-01-02 DIAGNOSIS — F332 Major depressive disorder, recurrent severe without psychotic features: Secondary | ICD-10-CM | POA: Diagnosis not present

## 2024-01-03 DIAGNOSIS — I10 Essential (primary) hypertension: Secondary | ICD-10-CM | POA: Diagnosis not present

## 2024-01-03 DIAGNOSIS — N951 Menopausal and female climacteric states: Secondary | ICD-10-CM | POA: Diagnosis not present

## 2024-01-03 DIAGNOSIS — E063 Autoimmune thyroiditis: Secondary | ICD-10-CM | POA: Diagnosis not present

## 2024-01-03 DIAGNOSIS — Z6841 Body Mass Index (BMI) 40.0 and over, adult: Secondary | ICD-10-CM | POA: Diagnosis not present

## 2024-01-03 DIAGNOSIS — F332 Major depressive disorder, recurrent severe without psychotic features: Secondary | ICD-10-CM | POA: Diagnosis not present

## 2024-01-04 DIAGNOSIS — F332 Major depressive disorder, recurrent severe without psychotic features: Secondary | ICD-10-CM | POA: Diagnosis not present

## 2024-01-05 DIAGNOSIS — F332 Major depressive disorder, recurrent severe without psychotic features: Secondary | ICD-10-CM | POA: Diagnosis not present

## 2024-01-05 DIAGNOSIS — F331 Major depressive disorder, recurrent, moderate: Secondary | ICD-10-CM | POA: Diagnosis not present

## 2024-01-08 DIAGNOSIS — F332 Major depressive disorder, recurrent severe without psychotic features: Secondary | ICD-10-CM | POA: Diagnosis not present

## 2024-01-10 DIAGNOSIS — F331 Major depressive disorder, recurrent, moderate: Secondary | ICD-10-CM | POA: Diagnosis not present

## 2024-01-10 DIAGNOSIS — F332 Major depressive disorder, recurrent severe without psychotic features: Secondary | ICD-10-CM | POA: Diagnosis not present

## 2024-01-12 DIAGNOSIS — F332 Major depressive disorder, recurrent severe without psychotic features: Secondary | ICD-10-CM | POA: Diagnosis not present

## 2024-01-15 ENCOUNTER — Ambulatory Visit (INDEPENDENT_AMBULATORY_CARE_PROVIDER_SITE_OTHER): Admitting: Podiatry

## 2024-01-15 ENCOUNTER — Encounter: Payer: Self-pay | Admitting: Podiatry

## 2024-01-15 VITALS — Ht 67.0 in | Wt 269.0 lb

## 2024-01-15 DIAGNOSIS — L97512 Non-pressure chronic ulcer of other part of right foot with fat layer exposed: Secondary | ICD-10-CM | POA: Diagnosis not present

## 2024-01-15 DIAGNOSIS — E08621 Diabetes mellitus due to underlying condition with foot ulcer: Secondary | ICD-10-CM

## 2024-01-15 DIAGNOSIS — F332 Major depressive disorder, recurrent severe without psychotic features: Secondary | ICD-10-CM | POA: Diagnosis not present

## 2024-01-16 NOTE — Progress Notes (Signed)
 Chief Complaint  Patient presents with   Diabetic Ulcer    Pt is here to f/u on right foot due to diabetic ulcer, she states that it looks better and has less drainage.    SUBJECTIVE Patient with a history of diabetes mellitus and multiple prior toe amputations secondary to osteomyelitis presenting for follow-up evaluation of an ulcer to the right foot.  She has been applying gentamicin  cream and wearing her diabetic shoes and insoles.   Past Medical History:  Diagnosis Date   Anxiety    Arthritis    knees   Coronary artery disease    Depression    Fatty liver    History of nuclear stress test 12/16/2011   exercise myoview; normal images with 2-16mm ST-segment depression - subsequent cath revelaed subtotally occluded small 2nd marginal branch & 75% PDA lesion, normal LV function   Hypertension    Hypothyroidism    Neuropathy    in feet   OSA on CPAP    AHI = 44 (per patient) setting 13 per pt   Personal history of kidney stones    Primary localized osteoarthritis of left knee 04/24/2015   Primary localized osteoarthritis of right knee 07/02/2015   Tobacco abuse    Type 2 diabetes mellitus (HCC)    insulin  pump    Allergies  Allergen Reactions   Penicillins Hives    Did it involve swelling of the face/tongue/throat, SOB, or low BP? No  Did it involve sudden or severe rash/hives, skin peeling, or any reaction on the inside of your mouth or nose? Yes  Did you need to seek medical attention at a hospital or doctor's office? No  When did it last happen?  childhood      If all above answers are NO, may proceed with cephalosporin use.  Other Reaction(s): Not available    RT foot 11/20/2023   RT foot 12/04/2023  OBJECTIVE General Patient is awake, alert, and oriented x 3 and in no acute distress. Derm the cellulitis around the great toe is resolved.  There is no erythema or edema noted.  The wound appears very stable with good improvement.  Granular wound base.  No  exposed bone muscle tendon ligament or joint for the moment.   Wound noted to the plantar aspect of the first MTP now measures approximately 1.0 x 1.0 x 0.6 cm.  There is some undermining however it does not probe to bone.  Healthy bleeding viable tissue noted.  Periwound is slightly macerated and callused. Vasc  DP and PT pedal pulses palpable bilaterally.  Neuro light touch and protective threshold sensation absent bilaterally.  Musculoskeletal Exam history of multiple prior amputations bilateral lesser digits.  Hammertoe contracture noted to the remaining digits Radiographic exam RT foot 12/28/2023 interval amputation of the 2nd and 3rd toes at the level of the MTP without complication.  Chronic contracture of the MTP and IPJ of the great toe with degenerative changes.  Possible subacute fracture of the plantar aspect at the base of the proximal phalanx best visualized on oblique view.  No osseous erosions or gas within the tissue that would be concerning for acute active osteomyelitis or gas gangrene  ASSESSMENT 1.  Diabetic ulcer right foot with fat layer exposed 2.  Cellulitis right foot; resolved  PLAN OF CARE -Patient evaluated today.  -Medically necessary excisional debridement including subcutaneous tissue was performed today using a tissue nipper.  Excisional debridement of the necrotic nonviable tissue down to healthier bleeding viable  tissue was performed with postdebridement measurements are as pre- -patient completed the oral doxycycline  x 2 weeks prescribed on 11/20/2023.  Currently not taking any antibiotics -Continue gentamicin  cream twice daily with sterile dressings -Continue postop shoe or diabetic shoes with custom molded insoles -Return to clinic 3 weeks    Thresa EMERSON Sar, DPM [facility]  Dr. Thresa EMERSON Sar, DPM    [address] [place], [place] [address]                Office [phone]  Fax [phone]

## 2024-01-17 DIAGNOSIS — F332 Major depressive disorder, recurrent severe without psychotic features: Secondary | ICD-10-CM | POA: Diagnosis not present

## 2024-01-19 DIAGNOSIS — F332 Major depressive disorder, recurrent severe without psychotic features: Secondary | ICD-10-CM | POA: Diagnosis not present

## 2024-01-22 ENCOUNTER — Ambulatory Visit (INDEPENDENT_AMBULATORY_CARE_PROVIDER_SITE_OTHER)

## 2024-01-22 ENCOUNTER — Encounter: Payer: Self-pay | Admitting: Podiatry

## 2024-01-22 ENCOUNTER — Ambulatory Visit: Admitting: Podiatry

## 2024-01-22 DIAGNOSIS — L97522 Non-pressure chronic ulcer of other part of left foot with fat layer exposed: Secondary | ICD-10-CM | POA: Diagnosis not present

## 2024-01-22 DIAGNOSIS — F330 Major depressive disorder, recurrent, mild: Secondary | ICD-10-CM | POA: Insufficient documentation

## 2024-01-22 DIAGNOSIS — B372 Candidiasis of skin and nail: Secondary | ICD-10-CM | POA: Insufficient documentation

## 2024-01-22 DIAGNOSIS — F332 Major depressive disorder, recurrent severe without psychotic features: Secondary | ICD-10-CM | POA: Diagnosis not present

## 2024-01-22 DIAGNOSIS — L03116 Cellulitis of left lower limb: Secondary | ICD-10-CM | POA: Diagnosis not present

## 2024-01-22 DIAGNOSIS — Z780 Asymptomatic menopausal state: Secondary | ICD-10-CM | POA: Insufficient documentation

## 2024-01-22 DIAGNOSIS — Z9989 Dependence on other enabling machines and devices: Secondary | ICD-10-CM | POA: Insufficient documentation

## 2024-01-22 DIAGNOSIS — J410 Simple chronic bronchitis: Secondary | ICD-10-CM | POA: Insufficient documentation

## 2024-01-22 DIAGNOSIS — F1999 Other psychoactive substance use, unspecified with unspecified psychoactive substance-induced disorder: Secondary | ICD-10-CM | POA: Insufficient documentation

## 2024-01-22 DIAGNOSIS — E063 Autoimmune thyroiditis: Secondary | ICD-10-CM | POA: Insufficient documentation

## 2024-01-22 DIAGNOSIS — F17219 Nicotine dependence, cigarettes, with unspecified nicotine-induced disorders: Secondary | ICD-10-CM | POA: Insufficient documentation

## 2024-01-22 DIAGNOSIS — G479 Sleep disorder, unspecified: Secondary | ICD-10-CM | POA: Insufficient documentation

## 2024-01-22 DIAGNOSIS — E611 Iron deficiency: Secondary | ICD-10-CM | POA: Insufficient documentation

## 2024-01-22 DIAGNOSIS — R635 Abnormal weight gain: Secondary | ICD-10-CM | POA: Insufficient documentation

## 2024-01-22 DIAGNOSIS — R232 Flushing: Secondary | ICD-10-CM | POA: Insufficient documentation

## 2024-01-22 DIAGNOSIS — E559 Vitamin D deficiency, unspecified: Secondary | ICD-10-CM | POA: Insufficient documentation

## 2024-01-22 MED ORDER — DOXYCYCLINE HYCLATE 100 MG PO TABS: 100.0000 mg | ORAL_TABLET | Freq: Two times a day (BID) | ORAL | 0 refills | Status: DC

## 2024-01-22 NOTE — Progress Notes (Signed)
 Chief Complaint  Patient presents with   Foot Ulcer    4th/5th interdigital left - new ulceration x 2 days - macerated, draining some, kept bandaged, last A1c was 6.3  Follow up ulcer sub 1st MPJ right - still the same, and  deep    SUBJECTIVE Patient with a history of diabetes mellitus and multiple prior toe amputations secondary to osteomyelitis presenting for follow-up evaluation of an ulcer to the right foot.  She has been applying gentamicin  cream and wearing her diabetic shoes and insoles.   Past Medical History:  Diagnosis Date   Anxiety    Arthritis    knees   Coronary artery disease    Depression    Fatty liver    History of nuclear stress test 12/16/2011   exercise myoview; normal images with 2-40mm ST-segment depression - subsequent cath revelaed subtotally occluded small 2nd marginal branch & 75% PDA lesion, normal LV function   Hypertension    Hypothyroidism    Neuropathy    in feet   OSA on CPAP    AHI = 44 (per patient) setting 13 per pt   Personal history of kidney stones    Primary localized osteoarthritis of left knee 04/24/2015   Primary localized osteoarthritis of right knee 07/02/2015   Tobacco abuse    Type 2 diabetes mellitus (HCC)    insulin  pump    Allergies  Allergen Reactions   Penicillins Hives    Did it involve swelling of the face/tongue/throat, SOB, or low BP? No  Did it involve sudden or severe rash/hives, skin peeling, or any reaction on the inside of your mouth or nose? Yes  Did you need to seek medical attention at a hospital or doctor's office? No  When did it last happen?  childhood      If all above answers are NO, may proceed with cephalosporin use.  Other Reaction(s): Not available    RT foot 11/20/2023   RT foot 12/04/2023  OBJECTIVE General Patient is awake, alert, and oriented x 3 and in no acute distress. Derm New onset of ulcer noted to the medial base of the fifth toe left foot w/ surrounding erythema and  edema. No malodor. No purulence. The wound measures approximately 0.5x0.5x0.3cm. The wound is deep but I do not find that it probes to bone.  Wound noted to the plantar aspect of the first MTP now measures approximately 1.0 x 1.0 x 0.6 cm.  There is some undermining however it does not probe to bone.  Healthy bleeding viable tissue noted.  Periwound is slightly macerated and callused. Vasc  DP and PT pedal pulses palpable bilaterally.  Neuro light touch and protective threshold sensation absent bilaterally.  Musculoskeletal Exam history of multiple prior amputations bilateral lesser digits.  Hammertoe contracture noted to the remaining digits Radiographic exam RT foot 12/28/2023 interval amputation of the 2nd and 3rd toes at the level of the MTP without complication.  Chronic contracture of the MTP and IPJ of the great toe with degenerative changes.  Possible subacute fracture of the plantar aspect at the base of the proximal phalanx best visualized on oblique view.  No osseous erosions or gas within the tissue that would be concerning for acute active osteomyelitis or gas gangrene Radiographic exam LT foot 01/22/2024 history of amputation to the 3rd and 4th digit of the left foot at the level of the MTP.  There is no obvious indication of osseous irregularity however there may be some subtle irregularity  at the base of the distal phalanx and head of the proximal phalanx of the fifth toe at the level of the IPJ medially which correlates with the area of the ulcer.  ASSESSMENT 1.  Diabetic ulcer right foot with fat layer exposed 2.  New onset of diabetic ulcer fifth toe left foot 3.  Cellulitis left foot  PLAN OF CARE -Patient evaluated today.  X-rays reviewed.  We will simply observe for now and get new x-rays in about 4-6 weeks -Medically necessary excisional debridement including subcutaneous tissue was performed today using a tissue nipper.  Excisional debridement of the necrotic nonviable tissue  down to healthier bleeding viable tissue was performed with postdebridement measurements are as pre- -prescription for doxycycline  100 mg twice daily x 10 days -Continue gentamicin  cream twice daily with sterile dressings to the right plantar first MTP wound - Betadine  and gauze provided for the patient to apply to the left fifth toe wound -Return to clinic 3 weeks    Thresa EMERSON Sar, DPM [facility]  Dr. Thresa EMERSON Sar, DPM    [address] [place], [place] [address]                Office [phone]  Fax [phone]

## 2024-01-26 DIAGNOSIS — F331 Major depressive disorder, recurrent, moderate: Secondary | ICD-10-CM | POA: Diagnosis not present

## 2024-01-31 DIAGNOSIS — E611 Iron deficiency: Secondary | ICD-10-CM | POA: Diagnosis not present

## 2024-01-31 DIAGNOSIS — Z6838 Body mass index (BMI) 38.0-38.9, adult: Secondary | ICD-10-CM | POA: Diagnosis not present

## 2024-02-05 DIAGNOSIS — H43823 Vitreomacular adhesion, bilateral: Secondary | ICD-10-CM | POA: Diagnosis not present

## 2024-02-05 DIAGNOSIS — E113411 Type 2 diabetes mellitus with severe nonproliferative diabetic retinopathy with macular edema, right eye: Secondary | ICD-10-CM | POA: Diagnosis not present

## 2024-02-05 DIAGNOSIS — H43811 Vitreous degeneration, right eye: Secondary | ICD-10-CM | POA: Diagnosis not present

## 2024-02-05 DIAGNOSIS — E113412 Type 2 diabetes mellitus with severe nonproliferative diabetic retinopathy with macular edema, left eye: Secondary | ICD-10-CM | POA: Diagnosis not present

## 2024-02-12 ENCOUNTER — Encounter: Payer: Self-pay | Admitting: Podiatry

## 2024-02-12 ENCOUNTER — Ambulatory Visit: Admitting: Podiatry

## 2024-02-12 VITALS — Ht 67.0 in | Wt 269.0 lb

## 2024-02-12 DIAGNOSIS — L97522 Non-pressure chronic ulcer of other part of left foot with fat layer exposed: Secondary | ICD-10-CM

## 2024-02-12 DIAGNOSIS — L97512 Non-pressure chronic ulcer of other part of right foot with fat layer exposed: Secondary | ICD-10-CM | POA: Diagnosis not present

## 2024-02-12 DIAGNOSIS — E08621 Diabetes mellitus due to underlying condition with foot ulcer: Secondary | ICD-10-CM

## 2024-02-12 NOTE — Progress Notes (Signed)
 "  Chief Complaint  Patient presents with   Diabetic Ulcer    Pt is here to f/u on both feet due to ulcers, she states she does not thinks its getting better, continues to keep areas cleaned and bandage, has a callous on the side of the right foot that is bothering her.    Subjective:  68 y.o. female with PMHx of diabetes mellitus, reports that her last A1c was 6.0, presenting for multiple diabetic foot ulcers   Past Medical History:  Diagnosis Date   Anxiety    Arthritis    knees   Coronary artery disease    Depression    Fatty liver    History of nuclear stress test 12/16/2011   exercise myoview; normal images with 2-40mm ST-segment depression - subsequent cath revelaed subtotally occluded small 2nd marginal branch & 75% PDA lesion, normal LV function   Hypertension    Hypothyroidism    Neuropathy    in feet   OSA on CPAP    AHI = 44 (per patient) setting 13 per pt   Personal history of kidney stones    Primary localized osteoarthritis of left knee 04/24/2015   Primary localized osteoarthritis of right knee 07/02/2015   Tobacco abuse    Type 2 diabetes mellitus (HCC)    insulin  pump    Past Surgical History:  Procedure Laterality Date   3rd right toe amputation  02/2016   AMPUTATION Right 12/22/2014   Procedure: RIGHT 2ND TOE AMPUTATION;  Surgeon: Norleen Armor, MD;  [facility]: [facility];  Service: Orthopedics;  Laterality: Right;   AMPUTATION TOE Left 08/18/2022   Procedure: AMPUTATION TOE;  Surgeon: Janit Thresa HERO, DPM;  Location: [facility];  Service: Podiatry;  Laterality: Left;   AUGMENTATION MAMMAPLASTY Bilateral    BREAST ENHANCEMENT SURGERY Bilateral    CARDIAC CATHETERIZATION  01/04/2012   subtotally occluded small 2nd marginal branch & 75% PDA lesion, normal LV function   CATARACT EXTRACTION Bilateral    with lens implants   CHOLECYSTECTOMY  2008   COLONOSCOPY     COLONOSCOPY WITH PROPOFOL  N/A 05/12/2016   Procedure: COLONOSCOPY WITH PROPOFOL ;  Surgeon: Renaye Sous, MD;   Location: [facility];  Service: Endoscopy;  Laterality: N/A;   I & D KNEE WITH POLY EXCHANGE Left 05/13/2015   Procedure: IRRIGATION AND DEBRIDEMENT KNEE WITH POLY EXCHANGE;  Surgeon: Fonda Olmsted, MD;  Location: [facility];  Service: Orthopedics;  Laterality: Left;   LEFT HEART CATHETERIZATION WITH CORONARY ANGIOGRAM N/A 01/04/2012   Procedure: LEFT HEART CATHETERIZATION WITH CORONARY ANGIOGRAM;  Surgeon: Dorn JINNY Lesches, MD;  Location: [facility];  Service: Cardiovascular;  Laterality: N/A;   PARTIAL KNEE ARTHROPLASTY Left 04/24/2015   Procedure: LEFT UNICOMPARTMENTAL KNEE ARTHROPLASTY ;  Surgeon: Fonda Olmsted, MD;  Location: [facility];  Service: Orthopedics;  Laterality: Left;   PARTIAL KNEE ARTHROPLASTY Right 07/02/2015   Procedure: RIGHT UNI KNEE ARTHROPLASTY;  Surgeon: Fonda Olmsted, MD;  Location: [facility];  Service: Orthopedics;  Laterality: Right;  ANESTHESIA:  GENERAL, PRE/POST OP FEMORAL NERVE   POSTERIOR CERVICAL FUSION/FORAMINOTOMY  1990   ROBOTIC ASSISTED TOTAL HYSTERECTOMY WITH BILATERAL SALPINGO OOPHERECTOMY Bilateral 02/17/2015   Procedure: ROBOTIC ASSISTED TOTAL HYSTERECTOMY WITH BILATERAL SALPINGO OOPHORECTOMY;  Surgeon: Maurilio Ship, MD;  Location: [facility];  Service: Gynecology;  Laterality: Bilateral;    Allergies[1]   Objective/Physical Exam General: The patient is alert and oriented x3 in no acute distress.  Dermatology:  Wound #1: Right plantar first MTP  measuring approximately 0.5 x 0.5 x 0.3 cm (LxWxD).   Wound #2: Fourth interdigital webspace left foot measuring 0.6 x 0.6 x 0.2 cm  Wound #3: Left great toe IPJ measuring approximately 0.3 to 0.3 x 0.2 cm  To the noted ulceration(s), there is no eschar. There is a moderate amount of slough, fibrin, and necrotic tissue noted. Granulation tissue and wound base is red. There is a minimal amount of serosanguineous drainage noted. There is no exposed bone muscle-tendon ligament or joint with  exception of the IPJ ulcer which is concerning since it extends to the joint capsule.  Currently no exposed bone  Vascular: Palpable pedal pulses bilaterally. No edema or erythema noted. Capillary refill within normal limits.  Neurological: Light touch and protective threshold absent  Musculoskeletal Exam: History of prior multiple amputations bilateral toes  Radiographic exam LT foot 01/22/2024: Amputations noted to the 3rd and 4th digits of the left foot at the level of the MTP without complication.  Currently no osseous erosions or cortical irregularities concerning for osteomyelitis  Radiographic exam RT foot 12/28/2023: Amputations noted to the 2nd and 3rd digits of the right foot at the level of the MTP without complication.  Contracture noted to the remaining digits.  No osseous erosions or cortical irregularities concerning for osteomyelitis  Assessment: 1.  Ulcers bilateral feet secondary to diabetes mellitus 2.  History of multiple toe amputations bilateral   Plan of Care:  -Patient was evaluated. -Medically necessary excisional debridement including subcutaneous tissue was performed using a tissue nipper and a chisel blade. Excisional debridement of all the necrotic nonviable tissue down to healthy bleeding viable tissue was performed with post-debridement measurements same as pre-. -Betadine  wet-to-dry dressings applied.  Recommend Betadine  wet-to-dry dressings daily.  Supplies provided -Postop shoe was dispensed.  WBAT bilateral -Return to clinic 2 weeks.  Follow-up x-rays bilateral feet next visit   Thresa EMERSON Sar, DPM [facility]  Dr. Thresa EMERSON Sar, DPM    [address] [place], [place] [address]                Office [phone]  Fax [phone]        [1]  Allergies Allergen Reactions   Penicillins Hives    Did it involve swelling of the face/tongue/throat, SOB, or low BP? No  Did it involve sudden  or severe rash/hives, skin peeling, or any reaction on the inside of your mouth or nose? Yes  Did you need to seek medical attention at a hospital or doctor's office? No  When did it last happen?  childhood      If all above answers are NO, may proceed with cephalosporin use.  Other Reaction(s): Not available   "

## 2024-02-28 ENCOUNTER — Encounter: Payer: Self-pay | Admitting: Podiatry

## 2024-02-28 ENCOUNTER — Ambulatory Visit (INDEPENDENT_AMBULATORY_CARE_PROVIDER_SITE_OTHER)

## 2024-02-28 ENCOUNTER — Ambulatory Visit (INDEPENDENT_AMBULATORY_CARE_PROVIDER_SITE_OTHER): Admitting: Podiatry

## 2024-02-28 VITALS — Ht 67.0 in

## 2024-02-28 DIAGNOSIS — L97512 Non-pressure chronic ulcer of other part of right foot with fat layer exposed: Secondary | ICD-10-CM

## 2024-02-28 DIAGNOSIS — E08621 Diabetes mellitus due to underlying condition with foot ulcer: Secondary | ICD-10-CM

## 2024-02-28 DIAGNOSIS — L97522 Non-pressure chronic ulcer of other part of left foot with fat layer exposed: Secondary | ICD-10-CM

## 2024-02-28 MED ORDER — DOXYCYCLINE HYCLATE 100 MG PO TABS: 100.0000 mg | ORAL_TABLET | Freq: Two times a day (BID) | ORAL | 0 refills | Status: DC

## 2024-02-28 NOTE — Progress Notes (Signed)
 "  Chief Complaint  Patient presents with   Diabetic Ulcer    Pt is here to f/u on both feet due to ulcer, she states everything is about the same, concern with left big toe due to redness.    Subjective:  69 y.o. female with PMHx of diabetes mellitus, reports that her last A1c was 6.0, presenting for multiple diabetic foot ulcers   Past Medical History:  Diagnosis Date   Anxiety    Arthritis    knees   Coronary artery disease    Depression    Fatty liver    History of nuclear stress test 12/16/2011   exercise myoview; normal images with 2-74mm ST-segment depression - subsequent cath revelaed subtotally occluded small 2nd marginal branch & 75% PDA lesion, normal LV function   Hypertension    Hypothyroidism    Neuropathy    in feet   OSA on CPAP    AHI = 44 (per patient) setting 13 per pt   Personal history of kidney stones    Primary localized osteoarthritis of left knee 04/24/2015   Primary localized osteoarthritis of right knee 07/02/2015   Tobacco abuse    Type 2 diabetes mellitus (HCC)    insulin  pump    Past Surgical History:  Procedure Laterality Date   3rd right toe amputation  02/2016   AMPUTATION Right 12/22/2014   Procedure: RIGHT 2ND TOE AMPUTATION;  Surgeon: Norleen Armor, MD;  Location: [facility];  Service: Orthopedics;  Laterality: Right;   AMPUTATION TOE Left 08/18/2022   Procedure: AMPUTATION TOE;  Surgeon: Janit Thresa HERO, DPM;  Location: [facility];  Service: Podiatry;  Laterality: Left;   AUGMENTATION MAMMAPLASTY Bilateral    BREAST ENHANCEMENT SURGERY Bilateral    CARDIAC CATHETERIZATION  01/04/2012   subtotally occluded small 2nd marginal branch & 75% PDA lesion, normal LV function   CATARACT EXTRACTION Bilateral    with lens implants   CHOLECYSTECTOMY  2008   COLONOSCOPY     COLONOSCOPY WITH PROPOFOL  N/A 05/12/2016   Procedure: COLONOSCOPY WITH PROPOFOL ;  Surgeon: Renaye Sous, MD;  Location: [facility];  Service: Endoscopy;  Laterality: N/A;   I & D KNEE  WITH POLY EXCHANGE Left 05/13/2015   Procedure: IRRIGATION AND DEBRIDEMENT KNEE WITH POLY EXCHANGE;  Surgeon: Fonda Olmsted, MD;  Location: [facility];  Service: Orthopedics;  Laterality: Left;   LEFT HEART CATHETERIZATION WITH CORONARY ANGIOGRAM N/A 01/04/2012   Procedure: LEFT HEART CATHETERIZATION WITH CORONARY ANGIOGRAM;  Surgeon: Dorn JINNY Lesches, MD;  Location: [facility] CATH LAB;  Service: Cardiovascular;  Laterality: N/A;   PARTIAL KNEE ARTHROPLASTY Left 04/24/2015   Procedure: LEFT UNICOMPARTMENTAL KNEE ARTHROPLASTY ;  Surgeon: Fonda Olmsted, MD;  Location: [facility];  Service: Orthopedics;  Laterality: Left;   PARTIAL KNEE ARTHROPLASTY Right 07/02/2015   Procedure: RIGHT UNI KNEE ARTHROPLASTY;  Surgeon: Fonda Olmsted, MD;  Location: [facility];  Service: Orthopedics;  Laterality: Right;  ANESTHESIA:  GENERAL, PRE/POST OP FEMORAL NERVE   POSTERIOR CERVICAL FUSION/FORAMINOTOMY  1990   ROBOTIC ASSISTED TOTAL HYSTERECTOMY WITH BILATERAL SALPINGO OOPHERECTOMY Bilateral 02/17/2015   Procedure: ROBOTIC ASSISTED TOTAL HYSTERECTOMY WITH BILATERAL SALPINGO OOPHORECTOMY;  Surgeon: Maurilio Ship, MD;  Location: [facility];  Service: Gynecology;  Laterality: Bilateral;    Allergies[1]   RT foot 02/28/2024  LT foot 02/28/2024  LT foot 02/28/2024   Objective/Physical Exam General: The patient is alert and oriented x3 in no acute distress.  Dermatology:  Wound #1: Right plantar first MTP measuring approximately  0.5 x 0.5 x 0.8 cm (LxWxD).  Unfortunately today the wound seems to probe to bone and sesamoid apparatus.  No significant malodor or drainage  Wound #2: Fourth interdigital webspace left foot measuring 0.6 x 0.6 x 0.2 cm.  Unchanged.  Wound #3: Left great toe IPJ measuring approximately 0.3 to 0.3 x 0.4 cm.  This wound also probes to bone concerning for underlying osteomyelitis due to the erythema around the wound and encompassing the great toe  Vascular: Palpable pedal pulses  bilaterally. No edema or erythema noted. Capillary refill within normal limits.  Clinically no concern for vascular compromise but  Neurological: Light touch and protective threshold absent  Musculoskeletal Exam: History of prior multiple amputations bilateral toes  Radiographic exam LT foot 02/28/2024: Unchanged.  Amputations noted to the 3rd and 4th digits of the left foot at the level of the MTP without complication.  Currently no osseous erosions or cortical irregularities concerning for osteomyelitis  Radiographic exam RT foot 02/28/2024: Unchanged.  Amputations noted to the 2nd and 3rd digits of the right foot at the level of the MTP without complication.  Contracture noted to the remaining digits.  No osseous erosions or cortical irregularities concerning for osteomyelitis  Assessment: 1.  Diabetic foot ulcers bilateral feet secondary to diabetes mellitus 2.  History of multiple toe amputations bilateral 3.  Concern for possible underlying osteomyelitis 4.  Cellulitis left great toe   Plan of Care:  -Patient was evaluated. - Prescription for doxycycline  100 mg twice daily x 14 days -Continue gentamicin  cream to the plantar wound of the right foot as well as Betadine  to the left great toe IPJ -Due to concern for underlying osteomyelitis I did go ahead and order MRI bilateral toes wo contrast. -Return to clinic 2 weeks   Thresa EMERSON Sar, DPM [facility]  Dr. Thresa EMERSON Sar, DPM    [address] [place], [place] [address]                Office [phone]  Fax [phone]        [1]  Allergies Allergen Reactions   Penicillins Hives    Did it involve swelling of the face/tongue/throat, SOB, or low BP? No  Did it involve sudden or severe rash/hives, skin peeling, or any reaction on the inside of your mouth or nose? Yes  Did you need to seek medical attention at a hospital or doctor's office? No  When did it last  happen?  childhood      If all above answers are NO, may proceed with cephalosporin use.  Other Reaction(s): Not available   "

## 2024-03-05 ENCOUNTER — Telehealth: Payer: Self-pay | Admitting: Podiatry

## 2024-03-05 NOTE — Telephone Encounter (Signed)
 Patient would like to know if provider would like, her to come in to see wounds on bottom of feet before MRI results or after? Patient's contact phone# [phone]

## 2024-03-13 ENCOUNTER — Encounter: Payer: Self-pay | Admitting: Podiatry

## 2024-03-13 ENCOUNTER — Ambulatory Visit: Admitting: Podiatry

## 2024-03-13 VITALS — Ht 67.0 in | Wt 269.0 lb

## 2024-03-13 DIAGNOSIS — L97522 Non-pressure chronic ulcer of other part of left foot with fat layer exposed: Secondary | ICD-10-CM

## 2024-03-13 DIAGNOSIS — E08621 Diabetes mellitus due to underlying condition with foot ulcer: Secondary | ICD-10-CM | POA: Diagnosis not present

## 2024-03-13 DIAGNOSIS — L97512 Non-pressure chronic ulcer of other part of right foot with fat layer exposed: Secondary | ICD-10-CM

## 2024-03-13 MED ORDER — DOXYCYCLINE HYCLATE 100 MG PO TABS: 100.0000 mg | ORAL_TABLET | Freq: Two times a day (BID) | ORAL | 0 refills | Status: AC

## 2024-03-13 NOTE — Progress Notes (Signed)
 "  Chief Complaint  Patient presents with   Diabetic Ulcer    Pt is here to f/u on bilateral feet due to ulcers, she states everything is the same, states MRI is not until Sunday.    Subjective:  69 y.o. female with PMHx of diabetes mellitus, reports that her last A1c was 6.0, presenting for multiple diabetic foot ulcers   Past Medical History:  Diagnosis Date   Anxiety    Arthritis    knees   Coronary artery disease    Depression    Fatty liver    History of nuclear stress test 12/16/2011   exercise myoview; normal images with 2-48mm ST-segment depression - subsequent cath revelaed subtotally occluded small 2nd marginal branch & 75% PDA lesion, normal LV function   Hypertension    Hypothyroidism    Neuropathy    in feet   OSA on CPAP    AHI = 44 (per patient) setting 13 per pt   Personal history of kidney stones    Primary localized osteoarthritis of left knee 04/24/2015   Primary localized osteoarthritis of right knee 07/02/2015   Tobacco abuse    Type 2 diabetes mellitus (HCC)    insulin  pump    Past Surgical History:  Procedure Laterality Date   3rd right toe amputation  02/2016   AMPUTATION Right 12/22/2014   Procedure: RIGHT 2ND TOE AMPUTATION;  Surgeon: Norleen Armor, MD;  Location: [facility];  Service: Orthopedics;  Laterality: Right;   AMPUTATION TOE Left 08/18/2022   Procedure: AMPUTATION TOE;  Surgeon: Janit Thresa HERO, DPM;  Location: [facility];  Service: Podiatry;  Laterality: Left;   AUGMENTATION MAMMAPLASTY Bilateral    BREAST ENHANCEMENT SURGERY Bilateral    CARDIAC CATHETERIZATION  01/04/2012   subtotally occluded small 2nd marginal branch & 75% PDA lesion, normal LV function   CATARACT EXTRACTION Bilateral    with lens implants   CHOLECYSTECTOMY  2008   COLONOSCOPY     COLONOSCOPY WITH PROPOFOL  N/A 05/12/2016   Procedure: COLONOSCOPY WITH PROPOFOL ;  Surgeon: Renaye Sous, MD;  Location: [facility];  Service: Endoscopy;  Laterality: N/A;   I & D KNEE WITH POLY  EXCHANGE Left 05/13/2015   Procedure: IRRIGATION AND DEBRIDEMENT KNEE WITH POLY EXCHANGE;  Surgeon: Fonda Olmsted, MD;  Location: [facility];  Service: Orthopedics;  Laterality: Left;   LEFT HEART CATHETERIZATION WITH CORONARY ANGIOGRAM N/A 01/04/2012   Procedure: LEFT HEART CATHETERIZATION WITH CORONARY ANGIOGRAM;  Surgeon: Dorn JINNY Lesches, MD;  Location: [facility];  Service: Cardiovascular;  Laterality: N/A;   PARTIAL KNEE ARTHROPLASTY Left 04/24/2015   Procedure: LEFT UNICOMPARTMENTAL KNEE ARTHROPLASTY ;  Surgeon: Fonda Olmsted, MD;  Location: [facility];  Service: Orthopedics;  Laterality: Left;   PARTIAL KNEE ARTHROPLASTY Right 07/02/2015   Procedure: RIGHT UNI KNEE ARTHROPLASTY;  Surgeon: Fonda Olmsted, MD;  Location: [facility];  Service: Orthopedics;  Laterality: Right;  ANESTHESIA:  GENERAL, PRE/POST OP FEMORAL NERVE   POSTERIOR CERVICAL FUSION/FORAMINOTOMY  1990   ROBOTIC ASSISTED TOTAL HYSTERECTOMY WITH BILATERAL SALPINGO OOPHERECTOMY Bilateral 02/17/2015   Procedure: ROBOTIC ASSISTED TOTAL HYSTERECTOMY WITH BILATERAL SALPINGO OOPHORECTOMY;  Surgeon: Maurilio Ship, MD;  Location: [facility];  Service: Gynecology;  Laterality: Bilateral;    Allergies[1]   RT foot 02/28/2024  LT foot 02/28/2024  LT foot 02/28/2024   Objective/Physical Exam General: The patient is alert and oriented x3 in no acute distress.  Dermatology:  Wound #1: Right plantar first MTP measuring approximately 0.5 x 0.5  x 0.8 cm (LxWxD).  Unfortunately today the wound seems to probe to bone and sesamoid apparatus.  No significant malodor or drainage  Wound #2: Fourth interdigital webspace left foot measuring 0.6 x 0.6 x 0.2 cm.  Unchanged.  Wound #3: Left great toe IPJ measuring approximately 0.3 to 0.3 x 0.4 cm.  This wound also probes to bone concerning for underlying osteomyelitis due to the erythema around the wound and encompassing the great toe  Vascular: Palpable pedal pulses bilaterally.  No edema or erythema noted. Capillary refill within normal limits.  Clinically no concern for vascular compromise but  Neurological: Light touch and protective threshold absent  Musculoskeletal Exam: History of prior multiple amputations bilateral toes  Radiographic exam LT foot 02/28/2024: Unchanged.  Amputations noted to the 3rd and 4th digits of the left foot at the level of the MTP without complication.  Currently no osseous erosions or cortical irregularities concerning for osteomyelitis  Radiographic exam RT foot 02/28/2024: Unchanged.  Amputations noted to the 2nd and 3rd digits of the right foot at the level of the MTP without complication.  Contracture noted to the remaining digits.  No osseous erosions or cortical irregularities concerning for osteomyelitis  Assessment: 1.  Diabetic foot ulcers bilateral feet secondary to diabetes mellitus 2.  History of multiple toe amputations bilateral 3.  Concern for possible underlying osteomyelitis 4.  Cellulitis left great toe   Plan of Care:  -Patient was evaluated.  There appears to be significant improvement with the doxycycline .  Continue - Continue doxycycline  100 mg twice daily x 14 days.  Prescribed 02/28/2024.  I would like to keep her on this doxycycline  at least until MRI results are available.  Refill provided -Continue gentamicin  cream to the plantar wound of the right foot as well as Betadine  to the left great toe IPJ - MRI bilateral toes wo contrast scheduled for Sunday, 03/17/2024 -Return to clinic after MRI results are available to review and discuss further treatment options   Thresa EMERSON Sar, DPM [facility]  Dr. Thresa EMERSON Sar, DPM    [address] [place], [place] [address]                Office [phone]  Fax [phone]        [1]  Allergies Allergen Reactions   Penicillins Hives    Did it involve swelling of the face/tongue/throat, SOB, or low BP?  No  Did it involve sudden or severe rash/hives, skin peeling, or any reaction on the inside of your mouth or nose? Yes  Did you need to seek medical attention at a hospital or doctor's office? No  When did it last happen?  childhood      If all above answers are NO, may proceed with cephalosporin use.  Other Reaction(s): Not available   "

## 2024-03-17 ENCOUNTER — Other Ambulatory Visit

## 2024-04-03 ENCOUNTER — Ambulatory Visit: Admitting: Podiatry

## 2024-04-04 ENCOUNTER — Other Ambulatory Visit

## 2024-10-09 ENCOUNTER — Telehealth: Admitting: Neurology
